# Patient Record
Sex: Female | Born: 1965
Health system: Southern US, Community
[De-identification: ages and names within clinical notes are randomized; demographics above are authoritative.]

## PROBLEM LIST (undated history)

## (undated) DIAGNOSIS — D649 Anemia, unspecified: Secondary | ICD-10-CM

## (undated) DIAGNOSIS — E119 Type 2 diabetes mellitus without complications: Secondary | ICD-10-CM

## (undated) DIAGNOSIS — G4733 Obstructive sleep apnea (adult) (pediatric): Secondary | ICD-10-CM

## (undated) DIAGNOSIS — G4700 Insomnia, unspecified: Secondary | ICD-10-CM

## (undated) DIAGNOSIS — G43909 Migraine, unspecified, not intractable, without status migrainosus: Secondary | ICD-10-CM

## (undated) DIAGNOSIS — M1990 Unspecified osteoarthritis, unspecified site: Secondary | ICD-10-CM

## (undated) DIAGNOSIS — F909 Attention-deficit hyperactivity disorder, unspecified type: Secondary | ICD-10-CM

## (undated) DIAGNOSIS — R079 Chest pain, unspecified: Secondary | ICD-10-CM

## (undated) DIAGNOSIS — J45909 Unspecified asthma, uncomplicated: Secondary | ICD-10-CM

## (undated) DIAGNOSIS — F32A Depression, unspecified: Secondary | ICD-10-CM

## (undated) DIAGNOSIS — R011 Cardiac murmur, unspecified: Secondary | ICD-10-CM

## (undated) DIAGNOSIS — G2581 Restless legs syndrome: Secondary | ICD-10-CM

## (undated) DIAGNOSIS — Z95810 Presence of automatic (implantable) cardiac defibrillator: Secondary | ICD-10-CM

## (undated) DIAGNOSIS — I499 Cardiac arrhythmia, unspecified: Secondary | ICD-10-CM

## (undated) DIAGNOSIS — I5020 Unspecified systolic (congestive) heart failure: Secondary | ICD-10-CM

## (undated) DIAGNOSIS — I2510 Atherosclerotic heart disease of native coronary artery without angina pectoris: Secondary | ICD-10-CM

## (undated) DIAGNOSIS — F329 Major depressive disorder, single episode, unspecified: Secondary | ICD-10-CM

## (undated) DIAGNOSIS — I428 Other cardiomyopathies: Secondary | ICD-10-CM

## (undated) DIAGNOSIS — K589 Irritable bowel syndrome without diarrhea: Secondary | ICD-10-CM

## (undated) DIAGNOSIS — T7840XA Allergy, unspecified, initial encounter: Secondary | ICD-10-CM

## (undated) DIAGNOSIS — K7460 Unspecified cirrhosis of liver: Secondary | ICD-10-CM

## (undated) DIAGNOSIS — G4730 Sleep apnea, unspecified: Secondary | ICD-10-CM

## (undated) DIAGNOSIS — R42 Dizziness and giddiness: Secondary | ICD-10-CM

## (undated) DIAGNOSIS — K5792 Diverticulitis of intestine, part unspecified, without perforation or abscess without bleeding: Secondary | ICD-10-CM

## (undated) DIAGNOSIS — F319 Bipolar disorder, unspecified: Secondary | ICD-10-CM

## (undated) DIAGNOSIS — I509 Heart failure, unspecified: Secondary | ICD-10-CM

## (undated) DIAGNOSIS — R109 Unspecified abdominal pain: Secondary | ICD-10-CM

## (undated) DIAGNOSIS — F4310 Post-traumatic stress disorder, unspecified: Secondary | ICD-10-CM

## (undated) DIAGNOSIS — E039 Hypothyroidism, unspecified: Secondary | ICD-10-CM

## (undated) DIAGNOSIS — I10 Essential (primary) hypertension: Secondary | ICD-10-CM

## (undated) HISTORY — DX: Chest pain, unspecified: R07.9

## (undated) HISTORY — DX: Unspecified osteoarthritis, unspecified site: M19.90

## (undated) HISTORY — PX: CORONARY ARTERY BYPASS GRAFT: SHX141

## (undated) HISTORY — DX: Unspecified abdominal pain: R10.9

## (undated) HISTORY — DX: Migraine, unspecified, not intractable, without status migrainosus: G43.909

## (undated) HISTORY — PX: INSERT / REPLACE / REMOVE PACEMAKER: SUR710

## (undated) HISTORY — DX: Insomnia, unspecified: G47.00

## (undated) HISTORY — DX: Restless legs syndrome: G25.81

## (undated) HISTORY — PX: INSERTION OF ICD: SHX6689

## (undated) HISTORY — DX: Irritable bowel syndrome, unspecified: K58.9

## (undated) HISTORY — DX: Diverticulitis of intestine, part unspecified, without perforation or abscess without bleeding: K57.92

## (undated) HISTORY — DX: Unspecified cirrhosis of liver: K74.60

## (undated) HISTORY — DX: Unspecified systolic (congestive) heart failure: I50.20

## (undated) HISTORY — PX: TUBAL LIGATION: SHX77

## (undated) HISTORY — DX: Attention-deficit hyperactivity disorder, unspecified type: F90.9

## (undated) HISTORY — DX: Dizziness and giddiness: R42

## (undated) HISTORY — DX: Other cardiomyopathies: I42.8

## (undated) HISTORY — DX: Obstructive sleep apnea (adult) (pediatric): G47.33

## (undated) HISTORY — DX: Post-traumatic stress disorder, unspecified: F43.10

## (undated) HISTORY — PX: ABDOMINAL HYSTERECTOMY: SHX81

## (undated) HISTORY — PX: CARDIAC CATHETERIZATION: SHX172

## (undated) HISTORY — PX: BREAST BIOPSY: SHX20

## (undated) HISTORY — PX: TONSILLECTOMY: SUR1361

## (undated) HISTORY — DX: Allergy, unspecified, initial encounter: T78.40XA

---
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
## 2014-08-13 HISTORY — PX: INSERTION OF ICD: SHX6689

## 2014-08-13 HISTORY — PX: OTHER SURGICAL HISTORY: SHX169

## 2014-08-16 DIAGNOSIS — F341 Dysthymic disorder: Secondary | ICD-10-CM | POA: Diagnosis not present

## 2014-08-16 DIAGNOSIS — E119 Type 2 diabetes mellitus without complications: Secondary | ICD-10-CM | POA: Diagnosis not present

## 2014-08-16 DIAGNOSIS — I5022 Chronic systolic (congestive) heart failure: Secondary | ICD-10-CM | POA: Diagnosis not present

## 2014-08-17 DIAGNOSIS — E119 Type 2 diabetes mellitus without complications: Secondary | ICD-10-CM | POA: Diagnosis not present

## 2014-08-17 DIAGNOSIS — H25013 Cortical age-related cataract, bilateral: Secondary | ICD-10-CM | POA: Diagnosis not present

## 2014-08-23 DIAGNOSIS — R5381 Other malaise: Secondary | ICD-10-CM | POA: Diagnosis not present

## 2014-08-23 DIAGNOSIS — M4690 Unspecified inflammatory spondylopathy, site unspecified: Secondary | ICD-10-CM | POA: Diagnosis not present

## 2014-08-23 DIAGNOSIS — M792 Neuralgia and neuritis, unspecified: Secondary | ICD-10-CM | POA: Diagnosis not present

## 2014-08-23 DIAGNOSIS — G894 Chronic pain syndrome: Secondary | ICD-10-CM | POA: Diagnosis not present

## 2014-09-01 DIAGNOSIS — Z803 Family history of malignant neoplasm of breast: Secondary | ICD-10-CM | POA: Diagnosis not present

## 2014-09-01 DIAGNOSIS — Z1231 Encounter for screening mammogram for malignant neoplasm of breast: Secondary | ICD-10-CM | POA: Diagnosis not present

## 2014-09-02 DIAGNOSIS — M5489 Other dorsalgia: Secondary | ICD-10-CM | POA: Diagnosis not present

## 2014-09-02 DIAGNOSIS — M6281 Muscle weakness (generalized): Secondary | ICD-10-CM | POA: Diagnosis not present

## 2014-09-02 DIAGNOSIS — M25659 Stiffness of unspecified hip, not elsewhere classified: Secondary | ICD-10-CM | POA: Diagnosis not present

## 2014-09-08 DIAGNOSIS — M6281 Muscle weakness (generalized): Secondary | ICD-10-CM | POA: Diagnosis not present

## 2014-09-08 DIAGNOSIS — M25652 Stiffness of left hip, not elsewhere classified: Secondary | ICD-10-CM | POA: Diagnosis not present

## 2014-09-08 DIAGNOSIS — M5489 Other dorsalgia: Secondary | ICD-10-CM | POA: Diagnosis not present

## 2016-01-31 DIAGNOSIS — R1013 Epigastric pain: Secondary | ICD-10-CM | POA: Diagnosis not present

## 2016-01-31 DIAGNOSIS — R188 Other ascites: Secondary | ICD-10-CM | POA: Diagnosis not present

## 2016-02-04 DIAGNOSIS — R1011 Right upper quadrant pain: Secondary | ICD-10-CM | POA: Diagnosis not present

## 2016-02-17 DIAGNOSIS — R109 Unspecified abdominal pain: Secondary | ICD-10-CM | POA: Diagnosis not present

## 2016-02-17 DIAGNOSIS — R079 Chest pain, unspecified: Secondary | ICD-10-CM | POA: Diagnosis not present

## 2016-04-22 ENCOUNTER — Encounter: Payer: Self-pay | Admitting: Emergency Medicine

## 2016-04-22 ENCOUNTER — Emergency Department
Admission: EM | Admit: 2016-04-22 | Discharge: 2016-04-22 | Disposition: A | Discharge status: Home / Self Care | Payer: Medicare (Managed Care) | Attending: Emergency Medicine | Admitting: Emergency Medicine

## 2016-04-22 ENCOUNTER — Emergency Department: Payer: Medicare (Managed Care)

## 2016-04-22 DIAGNOSIS — I10 Essential (primary) hypertension: Secondary | ICD-10-CM | POA: Diagnosis not present

## 2016-04-22 DIAGNOSIS — R1013 Epigastric pain: Secondary | ICD-10-CM | POA: Insufficient documentation

## 2016-04-22 DIAGNOSIS — R0602 Shortness of breath: Secondary | ICD-10-CM | POA: Diagnosis not present

## 2016-04-22 DIAGNOSIS — I2510 Atherosclerotic heart disease of native coronary artery without angina pectoris: Secondary | ICD-10-CM | POA: Diagnosis not present

## 2016-04-22 DIAGNOSIS — R0789 Other chest pain: Secondary | ICD-10-CM | POA: Diagnosis not present

## 2016-04-22 DIAGNOSIS — E119 Type 2 diabetes mellitus without complications: Secondary | ICD-10-CM | POA: Diagnosis not present

## 2016-04-22 DIAGNOSIS — G8929 Other chronic pain: Secondary | ICD-10-CM | POA: Insufficient documentation

## 2016-04-22 DIAGNOSIS — J45909 Unspecified asthma, uncomplicated: Secondary | ICD-10-CM | POA: Insufficient documentation

## 2016-04-22 DIAGNOSIS — R079 Chest pain, unspecified: Secondary | ICD-10-CM

## 2016-04-22 HISTORY — DX: Type 2 diabetes mellitus without complications: E11.9

## 2016-04-22 HISTORY — DX: Atherosclerotic heart disease of native coronary artery without angina pectoris: I25.10

## 2016-04-22 HISTORY — DX: Unspecified asthma, uncomplicated: J45.909

## 2016-04-22 HISTORY — DX: Major depressive disorder, single episode, unspecified: F32.9

## 2016-04-22 HISTORY — DX: Bipolar disorder, unspecified: F31.9

## 2016-04-22 HISTORY — DX: Depression, unspecified: F32.A

## 2016-04-22 HISTORY — DX: Essential (primary) hypertension: I10

## 2016-04-22 LAB — COMPREHENSIVE METABOLIC PANEL
ALBUMIN: 3.8 g/dL (ref 3.5–5.0)
ALT: 29 U/L (ref 14–54)
AST: 23 U/L (ref 15–41)
Alkaline Phosphatase: 64 U/L (ref 38–126)
Anion gap: 7 (ref 5–15)
BUN: 19 mg/dL (ref 6–20)
CHLORIDE: 109 mmol/L (ref 101–111)
CO2: 24 mmol/L (ref 22–32)
CREATININE: 0.93 mg/dL (ref 0.44–1.00)
Calcium: 9.5 mg/dL (ref 8.9–10.3)
GFR calc Af Amer: >60 mL/min (ref >60)
GLUCOSE: 87 mg/dL (ref 65–99)
POTASSIUM: 3.5 mmol/L (ref 3.5–5.1)
Sodium: 140 mmol/L (ref 135–145)
Total Bilirubin: 2.2 mg/dL — ABNORMAL HIGH (ref 0.3–1.2)
Total Protein: 7.3 g/dL (ref 6.5–8.1)

## 2016-04-22 LAB — CBC
HEMATOCRIT: 30.1 % — AB (ref 35.0–47.0)
Hemoglobin: 10.4 g/dL — ABNORMAL LOW (ref 12.0–16.0)
MCH: 32.6 pg (ref 26.0–34.0)
MCHC: 34.5 g/dL (ref 32.0–36.0)
MCV: 94.4 fL (ref 80.0–100.0)
PLATELETS: 174 10*3/uL (ref 150–440)
RBC: 3.19 MIL/uL — AB (ref 3.80–5.20)
RDW: 15 % — AB (ref 11.5–14.5)
WBC: 8.9 10*3/uL (ref 3.6–11.0)

## 2016-04-22 LAB — TROPONIN I
TROPONIN I: 0.03 ng/mL — AB (ref <0.03)
Troponin I: 0.03 ng/mL (ref <0.03)

## 2016-04-22 LAB — LIPASE, BLOOD: Lipase: 32 U/L (ref 11–51)

## 2016-04-22 MED ORDER — HYDROCODONE-ACETAMINOPHEN 5-325 MG PO TABS: 1.0000 | ORAL_TABLET | ORAL | 0 refills | Status: DC | PRN

## 2016-04-22 MED ORDER — MORPHINE SULFATE (PF) 4 MG/ML IV SOLN
4.0000 mg | Freq: Once | INTRAVENOUS | Status: AC
  Administered 2016-04-22: 4 mg via INTRAVENOUS
  Filled 2016-04-22: qty 1

## 2016-04-22 MED ORDER — ONDANSETRON HCL 4 MG/2ML IJ SOLN
4.0000 mg | Freq: Once | INTRAMUSCULAR | Status: AC
  Administered 2016-04-22: 4 mg via INTRAVENOUS
  Filled 2016-04-22: qty 2

## 2016-04-22 NOTE — ED Notes (Signed)
NAD Noted at this time. PT resting in bed with eyes closed, respirations even and unlabored at this time. Will continue to monitor for further patient needs.

## 2016-04-22 NOTE — ED Notes (Signed)
NAD noted at this time. Pt resting in bed with family at bedside. Danelle Earthly, RN at bedside to redraw 2nd troponin. Delay explained to patient at this time. Pt states understanding. Pt placed on 2L O2 at this time due to decreasing sats to 88%, on 2L pt O2 sats 98%. Pt states when she sleeps she is supposed to wear CPAP.

## 2016-04-22 NOTE — ED Triage Notes (Signed)
Pt with cheat pain that started yesterday and woke her up this am. Pain is left chest 8/10. EMS gave 324mg  aspirin and 2 sprays nitro. Pain improved to 5/10. Pt with pacemaker.

## 2016-04-22 NOTE — ED Notes (Signed)
NAD noted at time of D/C. Pt taken to the lobby by Mardene Celeste, EDT via wheelchair. Pt states understanding of D/C instructions. MD aware of pt's BP, pt instructed to take a dose of BP medication when she gets home. Pt denies any questions or concerns at this time.

## 2016-04-22 NOTE — ED Provider Notes (Signed)
[facility] Emergency Department Provider Note  Time seen: 4:15 PM  I have reviewed the triage vital signs and the nursing notes.   HISTORY  Chief Complaint No chief complaint on file.    HPI Bonnie Ochoa is a 50 y.o. female with a past medical history of hypertension, hyperlipidemia, CAD status post AICD, who presents the emergency department for shortness of breath and chest pain. According to the patient since last night she has been experiencing intermittent shortness of breath, states around 2:00 this morning she began experiencing chest discomfort which she describes as a pressure sensation with sharp pain in the upper abdomen. States the abdominal pain is long-standing times multiple years, unchanged. She states the chest pain and shortness of breath, and go, she has been told in the past that she had a small heart attack denies any stents being placed. She states she has the AICD because the left part of her heart is enlarged. Patient received 2 sprays of nitroglycerin and 324 mg of aspirin prior to arrival with mild relief of chest discomfort. Patient also states significant anxiety which presents with chest discomfort and shortness of breath, and she is not sure if this is just her anxiety flaring up. Patient is from [place], states she recently moved to the area. Describes her chest discomfort currently as moderate, with shortness of breath is mild. Denies any leg pain or swelling.  No past medical history on file.  There are no active problems to display for this patient.   No past surgical history on file.  Prior to Admission medications   Not on File    Allergies not on file  No family history on file.  Social History Social History  Substance Use Topics  . Smoking status: Not on file  . Smokeless tobacco: Not on file  . Alcohol use Not on file    Review of Systems Constitutional: Negative for fever Cardiovascular: Intermittent chest  pain Respiratory: Intermittent shortness of breath Gastrointestinal: Upper abdominal pain, chronic, unchanged per patient. Negative for vomiting or diarrhea. Genitourinary: Negative for dysuria. Musculoskeletal: Negative for back pain. Neurological: Negative for headache 10-point ROS otherwise negative.  ____________________________________________   PHYSICAL EXAM:  Constitutional: Alert and oriented. Well appearing and in no distress. Eyes: Normal exam ENT   Head: Normocephalic and atraumatic.   Mouth/Throat: Mucous membranes are moist. Cardiovascular: Normal rate, regular rhythm. No murmur Respiratory: Normal respiratory effort without tachypnea nor retractions. Breath sounds are clear  Gastrointestinal: Soft and nontender. No distention Musculoskeletal: Nontender with normal range of motion in all extremities. No lower extremity tenderness or edema. Neurologic:  Normal speech and language. No gross focal neurologic deficits  Skin:  Skin is warm, dry and intact.  Psychiatric: Mood and affect are normal.  ____________________________________________    EKG  EKG reviewed and interpreted by myself shows normal sinus rhythm at 75 bpm, narrow QRS, normal axis, largely normal intervals, nonspecific ST changes with inferolateral T-wave inversions, no old EKG for comparison.  ____________________________________________    RADIOLOGY  Chest x-ray negative  ____________________________________________   INITIAL IMPRESSION / ASSESSMENT AND PLAN / ED COURSE  Pertinent labs & imaging results that were available during my care of the patient were reviewed by me and considered in my medical decision making (see chart for details).  The patient presents the emergency department for shortness of breath and intermittent chest pain which began overnight. Patient states mild shortness of breath with moderate chest discomfort currently. States she get the same  symptoms with her  anxiety and she is not sure if this is her anxiety or for something wrong with her heart. We will treat the patient's pain, obtain labs including troponin and lipase, chest x-ray, EKG, and closely monitor in the emergency department.  Chest x-ray is negative. EKG does show abnormalities with no old EKG for comparison. Patient's troponin is negative 2. Patient states she feels well her only complaint is mild epigastric pain but she states this is chronic. I discussed the patient the pain increases or she has any return of chest pain she is to return to the emergency department, the patient is agreeable to this plan.   ____________________________________________   FINAL CLINICAL IMPRESSION(S) / ED DIAGNOSES  Dyspnea Chest pain    Minna Antis, MD 04/22/16 2014

## 2016-04-22 NOTE — Discharge Instructions (Signed)
You have been seen in the emergency department today for chest pain. Your workup has shown normal results. As we discussed please follow-up with your primary care physician in the next 1-2 days for recheck. Return to the emergency department for any further chest pain, trouble breathing, or any other symptom personally concerning to yourself. °

## 2016-04-30 ENCOUNTER — Emergency Department: Payer: Medicare (Managed Care)

## 2016-04-30 ENCOUNTER — Encounter: Payer: Self-pay | Admitting: *Deleted

## 2016-04-30 ENCOUNTER — Emergency Department
Admission: EM | Admit: 2016-04-30 | Discharge: 2016-04-30 | Disposition: A | Discharge status: Home / Self Care | Payer: Medicare (Managed Care) | Attending: Emergency Medicine | Admitting: Emergency Medicine

## 2016-04-30 DIAGNOSIS — R1084 Generalized abdominal pain: Secondary | ICD-10-CM | POA: Insufficient documentation

## 2016-04-30 DIAGNOSIS — E119 Type 2 diabetes mellitus without complications: Secondary | ICD-10-CM | POA: Insufficient documentation

## 2016-04-30 DIAGNOSIS — I10 Essential (primary) hypertension: Secondary | ICD-10-CM | POA: Insufficient documentation

## 2016-04-30 DIAGNOSIS — M549 Dorsalgia, unspecified: Secondary | ICD-10-CM | POA: Insufficient documentation

## 2016-04-30 DIAGNOSIS — J45909 Unspecified asthma, uncomplicated: Secondary | ICD-10-CM | POA: Diagnosis not present

## 2016-04-30 DIAGNOSIS — R0789 Other chest pain: Secondary | ICD-10-CM | POA: Diagnosis present

## 2016-04-30 DIAGNOSIS — F419 Anxiety disorder, unspecified: Secondary | ICD-10-CM | POA: Insufficient documentation

## 2016-04-30 DIAGNOSIS — F1290 Cannabis use, unspecified, uncomplicated: Secondary | ICD-10-CM | POA: Insufficient documentation

## 2016-04-30 DIAGNOSIS — I2510 Atherosclerotic heart disease of native coronary artery without angina pectoris: Secondary | ICD-10-CM | POA: Insufficient documentation

## 2016-04-30 LAB — COMPREHENSIVE METABOLIC PANEL
ALBUMIN: 3.8 g/dL (ref 3.5–5.0)
ALK PHOS: 96 U/L (ref 38–126)
ALT: 212 U/L — ABNORMAL HIGH (ref 14–54)
ANION GAP: 8 (ref 5–15)
AST: 74 U/L — ABNORMAL HIGH (ref 15–41)
BUN: 21 mg/dL — ABNORMAL HIGH (ref 6–20)
CALCIUM: 9.2 mg/dL (ref 8.9–10.3)
CO2: 24 mmol/L (ref 22–32)
Chloride: 107 mmol/L (ref 101–111)
Creatinine, Ser: 1 mg/dL (ref 0.44–1.00)
GFR calc non Af Amer: >60 mL/min (ref >60)
Glucose, Bld: 164 mg/dL — ABNORMAL HIGH (ref 65–99)
POTASSIUM: 3.3 mmol/L — AB (ref 3.5–5.1)
SODIUM: 139 mmol/L (ref 135–145)
Total Bilirubin: 2.3 mg/dL — ABNORMAL HIGH (ref 0.3–1.2)
Total Protein: 7.4 g/dL (ref 6.5–8.1)

## 2016-04-30 LAB — CBC
HEMATOCRIT: 29.6 % — AB (ref 35.0–47.0)
HEMOGLOBIN: 10.1 g/dL — AB (ref 12.0–16.0)
MCH: 32.7 pg (ref 26.0–34.0)
MCHC: 34.1 g/dL (ref 32.0–36.0)
MCV: 95.9 fL (ref 80.0–100.0)
Platelets: 188 10*3/uL (ref 150–440)
RBC: 3.09 MIL/uL — AB (ref 3.80–5.20)
RDW: 17 % — ABNORMAL HIGH (ref 11.5–14.5)
WBC: 7.4 10*3/uL (ref 3.6–11.0)

## 2016-04-30 LAB — LIPASE, BLOOD: LIPASE: 26 U/L (ref 11–51)

## 2016-04-30 LAB — TROPONIN I: TROPONIN I: 0.03 ng/mL — AB (ref <0.03)

## 2016-04-30 MED ORDER — LORAZEPAM 2 MG/ML IJ SOLN
1.0000 mg | Freq: Once | INTRAMUSCULAR | Status: AC
  Administered 2016-04-30: 1 mg via INTRAVENOUS
  Filled 2016-04-30: qty 1

## 2016-04-30 MED ORDER — SODIUM CHLORIDE 0.9 % IV SOLN
Freq: Once | INTRAVENOUS | Status: AC
  Administered 2016-04-30: 16:00:00 via INTRAVENOUS

## 2016-04-30 MED ORDER — ONDANSETRON HCL 4 MG PO TABS: 4.0000 mg | ORAL_TABLET | Freq: Every day | ORAL | 1 refills | Status: DC | PRN

## 2016-04-30 MED ORDER — PANTOPRAZOLE SODIUM 40 MG PO TBEC: 40.0000 mg | DELAYED_RELEASE_TABLET | Freq: Every day | ORAL | 1 refills | Status: DC

## 2016-04-30 MED ORDER — HYDROMORPHONE HCL 1 MG/ML IJ SOLN
1.0000 mg | Freq: Once | INTRAMUSCULAR | Status: AC
  Administered 2016-04-30: 1 mg via INTRAVENOUS
  Filled 2016-04-30: qty 1

## 2016-04-30 MED ORDER — LORAZEPAM 0.5 MG PO TABS: 0.5000 mg | ORAL_TABLET | Freq: Three times a day (TID) | ORAL | 0 refills | Status: DC | PRN

## 2016-04-30 MED ORDER — OXYCODONE-ACETAMINOPHEN 5-325 MG PO TABS: 2.0000 | ORAL_TABLET | Freq: Four times a day (QID) | ORAL | 0 refills | Status: DC | PRN

## 2016-04-30 NOTE — ED Notes (Signed)
Dr. Don Perking notified of troponin 0.03.

## 2016-04-30 NOTE — ED Provider Notes (Signed)
[facility] Emergency Department Provider Note        Time seen: ----------------------------------------- 3:08 PM on 04/30/2016 -----------------------------------------    I have reviewed the triage vital signs and the nursing notes.   HISTORY  Chief Complaint Abdominal Pain and Chest Pain    HPI Bonnie Ochoa is a 50 y.o. female who presents to ER for generalized abdominal pain and chest pain. Patient states she has anxiety attacks as well as a myriad of other symptoms. Patient states she has had blood in her stools, urinary incontinence, hemoptysis, nosebleeds, PTSD and bipolar symptoms. Patient states nothing makes her symptoms better or worse. She is here for the same symptoms she was seen for approximately 8 days ago.   Past Medical History:  Diagnosis Date  . Asthma   . Bipolar 1 disorder ([place])   . Coronary artery disease   . Depression   . Diabetes mellitus without complication ([place])   . Hypertension     There are no active problems to display for this patient.   Past Surgical History:  Procedure Laterality Date  . ABDOMINAL HYSTERECTOMY    . INSERTION OF ICD      Allergies Review of patient's allergies indicates no known allergies.  Social History Social History  Substance Use Topics  . Smoking status: Never Smoker  . Smokeless tobacco: Never Used  . Alcohol use Yes    Review of Systems Constitutional: Negative for fever. Cardiovascular: Positive for chest pain Respiratory: Positive shortness of breath Gastrointestinal: Positive for abdominal pain, rectal bleeding Genitourinary: Negative for dysuria. Musculoskeletal: Positive for back pain Skin: Negative for rash. Neurological: Negative for headaches, focal weakness or numbness. Psychiatric: Positive for anxiety and panic attacks  10-point ROS otherwise negative.  ____________________________________________   PHYSICAL EXAM:  VITAL SIGNS: ED Triage Vitals  Enc  Vitals Group     BP 04/30/16 1220 (!) 143/93     Pulse Rate 04/30/16 1220 62     Resp 04/30/16 1220 18     Temp 04/30/16 1220 98.3 F (36.8 C)     Temp Source 04/30/16 1220 Oral     SpO2 04/30/16 1220 96 %     Weight 04/30/16 1219 155 lb (70.3 kg)     Height 04/30/16 1219 5' 5"  (1.651 m)     Head Circumference --      Peak Flow --      Pain Score 04/30/16 1219 8     Pain Loc --      Pain Edu? --      Excl. in [place]? --     Constitutional: Alert and oriented. No acute distress Eyes: Conjunctivae are normal. PERRL. Normal extraocular movements. ENT   Head: Normocephalic and atraumatic.   Nose: No congestion/rhinnorhea.   Mouth/Throat: Mucous membranes are moist.   Neck: No stridor. Cardiovascular: Normal rate, regular rhythm. No murmurs, rubs, or gallops. Respiratory: Normal respiratory effort without tachypnea nor retractions. Breath sounds are clear and equal bilaterally. No wheezes/rales/rhonchi. Gastrointestinal: Soft with diffuse upper abdominal tenderness, no rebound or guarding. Normal bowel sounds. Musculoskeletal: Nontender with normal range of motion in all extremities. No lower extremity tenderness nor edema. Neurologic:  Normal speech and language. No gross focal neurologic deficits are appreciated.  Skin:  Skin is warm, dry and intact. No rash noted. Psychiatric: Depressed mood and affect ____________________________________________  EKG: Interpreted by me. Sinus rhythm rate of 62 bpm, normal PR interval, normal QRS, normal QT interval. Normal axis. Inferior lateral T wave inversions unchanged  from prior.  ____________________________________________  ED COURSE:  Pertinent labs & imaging results that were available during my care of the patient were reviewed by me and considered in my medical decision making (see chart for details). Clinical Course  Patient presents to ER with multiple symptoms. We'll assess with basic labs and imaging. She will receive  IV Dilaudid and Ativan.  Procedures ____________________________________________   LABS (pertinent positives/negatives)  Labs Reviewed  COMPREHENSIVE METABOLIC PANEL - Abnormal; Notable for the following:       Result Value   Potassium 3.3 (*)    Glucose, Bld 164 (*)    BUN 21 (*)    AST 74 (*)    ALT 212 (*)    Total Bilirubin 2.3 (*)    All other components within normal limits  CBC - Abnormal; Notable for the following:    RBC 3.09 (*)    Hemoglobin 10.1 (*)    HCT 29.6 (*)    RDW 17.0 (*)    All other components within normal limits  TROPONIN I - Abnormal; Notable for the following:    Troponin I 0.03 (*)    All other components within normal limits  LIPASE, BLOOD  URINALYSIS COMPLETEWITH MICROSCOPIC ([facility] ONLY)    RADIOLOGY  Abdominal ultrasound IMPRESSION: Gallbladder wall thickening which may be related to mild ascites. There is a positive sonographic Murphy sign which may suggest acalculous cholecystitis. HIDA scan may be helpful if indicated.  ____________________________________________  FINAL ASSESSMENT AND PLAN  Chest pain, abdominal pain, anxiety attacks  Plan: Patient with labs and imaging as dictated above. Patient's lab and ultrasound findings are likely more reflective of cirrhosis due to a past medical history of heavy alcohol intake. She will be referred to general surgery for outpatient follow-up. I will place her on a short supply of pain medicine, anxiolytics and antacids until she can establish care with a primary doctor and be referred to general surgery.   Earleen Newport, MD   Note: This dictation was prepared with Dragon dictation. Any transcriptional errors that result from this process are unintentional    Earleen Newport, MD 04/30/16 [phone]

## 2016-04-30 NOTE — ED Notes (Signed)
Pt also states they wanted to remove her gallbladder 2 months ago but did not get cardiac clearance

## 2016-04-30 NOTE — ED Triage Notes (Signed)
States generalized abd pain and pain, states anxiety attacks, states blood in her stool, states symptoms for 2 weeks, states hx of bipolar and PTSD, states urinary incontience and coughing up blood, states nose bleed

## 2016-05-07 ENCOUNTER — Encounter: Payer: Self-pay | Admitting: General Surgery

## 2016-05-07 ENCOUNTER — Telehealth: Payer: Self-pay | Admitting: General Surgery

## 2016-05-07 ENCOUNTER — Ambulatory Visit (INDEPENDENT_AMBULATORY_CARE_PROVIDER_SITE_OTHER): Payer: Self-pay | Admitting: General Surgery

## 2016-05-07 VITALS — BP 138/104 | HR 58 | Temp 97.7°F | Ht 65.0 in | Wt 176.0 lb

## 2016-05-07 DIAGNOSIS — R1084 Generalized abdominal pain: Secondary | ICD-10-CM

## 2016-05-07 MED ORDER — HYDROCODONE-ACETAMINOPHEN 5-325 MG PO TABS: 1.0000 | ORAL_TABLET | Freq: Four times a day (QID) | ORAL | Status: DC | PRN

## 2016-05-07 MED ORDER — HYDROCODONE-ACETAMINOPHEN 5-325 MG PO TABS: 1.0000 | ORAL_TABLET | Freq: Four times a day (QID) | ORAL | 0 refills | Status: DC | PRN

## 2016-05-07 NOTE — Patient Instructions (Signed)
We would like for you to have your CT scan done on 05/14/16 @ 10.00. We need for you to go by the Radiology department today to pick up your prep for your CT scan. Please do NOT have any food or drink 4 hours prior to your CT scan. We will call you with the results of your CT scan. Please call our office if you have any questions.   Please see the address below for your CT scan. [address]

## 2016-05-07 NOTE — Telephone Encounter (Signed)
Explained to patient that we are not a provider of her [employer] [place] medic[place] medicaid plan and will be a self pay and she understood this and agreed to go ahead and see the doctor.

## 2016-05-07 NOTE — Progress Notes (Signed)
Patient ID: Bonnie Ochoa, female   DOB: 1965-12-06, 50 y.o.   MRN: [id number]  CC: ABDOMINAL PAIN  HPI Bonnie Ochoa is a 50 y.o. female who presents to clinic today for evaluation of abdominal pain. Patient was seen in the emergency Department last week for this pain and was then sent to clinic today. She reports the pain actually started about 4 and half weeks ago. She is unable to state anything that makes the pain better or worse. She states she's also developed shortness of breath during that time period. Patient states that she is also had fevers, chills, nausea during that timeframe. She denies any chest pain, diarrhea, constipation. Patient is very frustrated by how long she has felt ill and is unsure was actually going on.  HPI  Past Medical History:  Diagnosis Date  . Asthma   . Bipolar 1 disorder ([facility])   . Coronary artery disease   . Depression   . Diabetes mellitus without complication ([facility])   . Hypertension     Past Surgical History:  Procedure Laterality Date  . ABDOMINAL HYSTERECTOMY    . debribalator  2016  . hysterectmy  2011  . INSERTION OF ICD    . TONSILECTOMY, ADENOIDECTOMY, BILATERAL MYRINGOTOMY AND TUBES    . TUBAL LIGATION  1980    History reviewed. No pertinent family history.  Social History Social History  Substance Use Topics  . Smoking status: Never Smoker  . Smokeless tobacco: Never Used  . Alcohol use Yes    No Known Allergies  Current Outpatient Prescriptions  Medication Sig Dispense Refill  . aspirin EC 81 MG tablet Take 81 mg by mouth daily.    Marland Kitchen atorvastatin (LIPITOR) 40 MG tablet Take 40 mg by mouth daily.    . furosemide (LASIX) 80 MG tablet Take 80 mg by mouth.    Marland Kitchen lisinopril (PRINIVIL,ZESTRIL) 20 MG tablet Take 20 mg by mouth daily.    . metoprolol (TOPROL-XL) 200 MG 24 hr tablet Take 200 mg by mouth daily.    Marland Kitchen omega-3 acid ethyl esters (LOVAZA) 1 g capsule Take 1 capsule by mouth 2 (two) times daily.    Marland Kitchen PARoxetine (PAXIL) 30  MG tablet Take 30 mg by mouth daily.    . potassium chloride (KLOR-CON) 20 MEQ packet Take 20 mEq by mouth 2 (two) times daily.    . prazosin (MINIPRESS) 1 MG capsule Take 1 mg by mouth at bedtime.    Marland Kitchen QUEtiapine (SEROQUEL) 300 MG tablet Take 300 mg by mouth at bedtime.    Marland Kitchen spironolactone (ALDACTONE) 25 MG tablet Take 25 mg by mouth daily.    Marland Kitchen HYDROcodone-acetaminophen (NORCO) 5-325 MG tablet Take 1 tablet by mouth every 6 (six) hours as needed for moderate pain. 30 tablet 0  . LORazepam (ATIVAN) 0.5 MG tablet Take 1 tablet (0.5 mg total) by mouth every 8 (eight) hours as needed for anxiety. 30 tablet 0  . ondansetron (ZOFRAN) 4 MG tablet Take 1 tablet (4 mg total) by mouth daily as needed for nausea or vomiting. 20 tablet 1  . oxyCODONE-acetaminophen (PERCOCET) 5-325 MG tablet Take 2 tablets by mouth every 6 (six) hours as needed for moderate pain or severe pain. 20 tablet 0  . pantoprazole (PROTONIX) 40 MG tablet Take 1 tablet (40 mg total) by mouth daily. 30 tablet 1   No current facility-administered medications for this visit.      Review of Systems A Multi-point review of systems was asked and was negative  except for the findings documented in the history of present illness  Physical Exam Blood pressure (!) 138/104, pulse (!) 58, temperature 97.7 F (36.5 C), temperature source Oral, height 5\' 5"  (1.651 m), weight 79.8 kg (176 lb). CONSTITUTIONAL: No acute distress. EYES: Pupils are equal, round, and reactive to light, Sclera are non-icteric. EARS, NOSE, MOUTH AND THROAT: The oropharynx is clear. The oral mucosa is pink and moist. Hearing is intact to voice. LYMPH NODES:  Lymph nodes in the neck are normal. RESPIRATORY:  Lungs are clear. There is normal respiratory effort, with equal breath sounds bilaterally, and without pathologic use of accessory muscles. CARDIOVASCULAR: Heart is regular without murmurs, gallops, or rubs. GI: The abdomen is large, soft, tender to palpation to  mild palpation of all 4 quadrants but without rebound or guarding. The abdomen appears to be mildly distended with a palpable fluid wave. There are no palpable masses. There is palpable evidence of hepatosplenomegaly on physical exam. There are normal bowel sounds in all quadrants. GU: Rectal deferred.   MUSCULOSKELETAL: Normal muscle strength and tone. No cyanosis or edema.   SKIN: Turgor is good and there are no pathologic skin lesions or ulcers. NEUROLOGIC: Motor and sensation is grossly normal. Cranial nerves are grossly intact. PSYCH:  Oriented to person, place and time. Affect is normal.  Data Reviewed Upon review of the workup from the ER patient had elevation of her liver enzymes with an elevated ale to 212, and elevated AST of 74, elevated bilirubin of 2.4. Her white blood cell count was normal 7.4. Her chest x-ray was normal but her ultrasound was interpreted as thickened gallbladder wall in the setting of ascites. I have personally reviewed the patient's imaging, laboratory findings and medical records.    Assessment    Abdominal pain    Plan    50 year old female with abdominal pain of all 4 quadrants and what appears to be abdominal exam findings consistent with ascites and hepatosplenomegaly. Given these findings and her exam combination with the chronicity discussed with the patient the multiple possible causes of hepatosplenomegaly. Chronic alcohol abuse is a possibility. Discussed with the ultrasound did not visualize the entirety of the abdomen and given that her pain is in all 4 quadrants we will obtain an outpatient CT of the abdomen and pelvis. Discussed the patient that should she become unable to tolerate oral hydration or her level of pain that she should return to the emergency department for further evaluation and treatment. Otherwise we will obtain a CT scan and call her with the results. Should she have the suspected ascites and evidence of liver failure she'll be  referred to a liver specialist. Should her imaging return normal we will then bring her back to clinic to discuss whether not to further workup for gallbladder or remove her gallbladder at that time.     Time spent with the patient was 45 minutes, with more than 50% of the time spent in face-to-face education, counseling and care coordination.     Ricarda Frameharles Socorro Kanitz, MD FACS General Surgeon 05/07/2016, 4:13 PM

## 2016-05-14 ENCOUNTER — Ambulatory Visit
Admission: RE | Admit: 2016-05-14 | Discharge: 2016-05-14 | Disposition: A | Discharge status: Home / Self Care | Payer: Medicare Other | Source: Ambulatory Visit | Attending: General Surgery | Admitting: General Surgery

## 2016-05-14 DIAGNOSIS — R600 Localized edema: Secondary | ICD-10-CM | POA: Diagnosis not present

## 2016-05-14 DIAGNOSIS — R1084 Generalized abdominal pain: Secondary | ICD-10-CM | POA: Diagnosis not present

## 2016-05-14 DIAGNOSIS — I700 Atherosclerosis of aorta: Secondary | ICD-10-CM | POA: Diagnosis not present

## 2016-05-14 DIAGNOSIS — R188 Other ascites: Secondary | ICD-10-CM | POA: Insufficient documentation

## 2016-05-14 DIAGNOSIS — M5136 Other intervertebral disc degeneration, lumbar region: Secondary | ICD-10-CM | POA: Diagnosis not present

## 2016-05-14 HISTORY — DX: Heart failure, unspecified: I50.9

## 2016-05-14 MED ORDER — IOPAMIDOL (ISOVUE-300) INJECTION 61%
100.0000 mL | Freq: Once | INTRAVENOUS | Status: AC | PRN
  Administered 2016-05-14: 100 mL via INTRAVENOUS

## 2016-05-18 ENCOUNTER — Encounter: Payer: Self-pay | Admitting: General Surgery

## 2016-05-19 ENCOUNTER — Emergency Department
Admission: EM | Admit: 2016-05-19 | Discharge: 2016-05-20 | Disposition: A | Discharge status: Home / Self Care | Payer: Medicare Other | Attending: Emergency Medicine | Admitting: Emergency Medicine

## 2016-05-19 ENCOUNTER — Encounter: Payer: Self-pay | Admitting: General Surgery

## 2016-05-19 ENCOUNTER — Emergency Department: Payer: Medicare Other

## 2016-05-19 ENCOUNTER — Encounter: Payer: Self-pay | Admitting: Emergency Medicine

## 2016-05-19 DIAGNOSIS — E119 Type 2 diabetes mellitus without complications: Secondary | ICD-10-CM | POA: Insufficient documentation

## 2016-05-19 DIAGNOSIS — R1084 Generalized abdominal pain: Secondary | ICD-10-CM | POA: Insufficient documentation

## 2016-05-19 DIAGNOSIS — Z7982 Long term (current) use of aspirin: Secondary | ICD-10-CM | POA: Insufficient documentation

## 2016-05-19 DIAGNOSIS — J45909 Unspecified asthma, uncomplicated: Secondary | ICD-10-CM | POA: Insufficient documentation

## 2016-05-19 DIAGNOSIS — I2510 Atherosclerotic heart disease of native coronary artery without angina pectoris: Secondary | ICD-10-CM | POA: Insufficient documentation

## 2016-05-19 DIAGNOSIS — I509 Heart failure, unspecified: Secondary | ICD-10-CM | POA: Insufficient documentation

## 2016-05-19 DIAGNOSIS — Z87891 Personal history of nicotine dependence: Secondary | ICD-10-CM | POA: Insufficient documentation

## 2016-05-19 DIAGNOSIS — R079 Chest pain, unspecified: Secondary | ICD-10-CM | POA: Diagnosis not present

## 2016-05-19 DIAGNOSIS — F329 Major depressive disorder, single episode, unspecified: Secondary | ICD-10-CM | POA: Diagnosis not present

## 2016-05-19 DIAGNOSIS — R609 Edema, unspecified: Secondary | ICD-10-CM | POA: Diagnosis not present

## 2016-05-19 DIAGNOSIS — R0789 Other chest pain: Secondary | ICD-10-CM | POA: Diagnosis not present

## 2016-05-19 DIAGNOSIS — Z79899 Other long term (current) drug therapy: Secondary | ICD-10-CM | POA: Diagnosis not present

## 2016-05-19 DIAGNOSIS — I110 Hypertensive heart disease with heart failure: Secondary | ICD-10-CM | POA: Diagnosis not present

## 2016-05-19 LAB — CBC
HEMATOCRIT: 37.1 % (ref 35.0–47.0)
HEMOGLOBIN: 12.5 g/dL (ref 12.0–16.0)
MCH: 31.5 pg (ref 26.0–34.0)
MCHC: 33.6 g/dL (ref 32.0–36.0)
MCV: 93.7 fL (ref 80.0–100.0)
Platelets: 308 10*3/uL (ref 150–440)
RBC: 3.96 MIL/uL (ref 3.80–5.20)
RDW: 17.6 % — AB (ref 11.5–14.5)
WBC: 10.5 10*3/uL (ref 3.6–11.0)

## 2016-05-19 LAB — COMPREHENSIVE METABOLIC PANEL
ALT: 102 U/L — ABNORMAL HIGH (ref 14–54)
ANION GAP: 11 (ref 5–15)
AST: 63 U/L — ABNORMAL HIGH (ref 15–41)
Albumin: 3.8 g/dL (ref 3.5–5.0)
Alkaline Phosphatase: 89 U/L (ref 38–126)
BILIRUBIN TOTAL: 3.7 mg/dL — AB (ref 0.3–1.2)
BUN: 23 mg/dL — ABNORMAL HIGH (ref 6–20)
CO2: 20 mmol/L — ABNORMAL LOW (ref 22–32)
Calcium: 9.1 mg/dL (ref 8.9–10.3)
Chloride: 106 mmol/L (ref 101–111)
Creatinine, Ser: 1.15 mg/dL — ABNORMAL HIGH (ref 0.44–1.00)
GFR calc Af Amer: >60 mL/min (ref >60)
GFR, EST NON AFRICAN AMERICAN: 54 mL/min — AB (ref >60)
Glucose, Bld: 122 mg/dL — ABNORMAL HIGH (ref 65–99)
POTASSIUM: 3.7 mmol/L (ref 3.5–5.1)
Sodium: 137 mmol/L (ref 135–145)
TOTAL PROTEIN: 7.8 g/dL (ref 6.5–8.1)

## 2016-05-19 LAB — LIPASE, BLOOD: Lipase: 48 U/L (ref 11–51)

## 2016-05-19 LAB — TROPONIN I: TROPONIN I: 0.04 ng/mL — AB (ref <0.03)

## 2016-05-19 LAB — BRAIN NATRIURETIC PEPTIDE: B Natriuretic Peptide: 3787 pg/mL — ABNORMAL HIGH (ref 0.0–100.0)

## 2016-05-19 NOTE — ED Provider Notes (Signed)
[facility] Emergency Department Provider Note   ____________________________________________   First MD Initiated Contact with Patient 05/19/16 2300     (approximate)  I have reviewed the triage vital signs and the nursing notes.   HISTORY  Chief Complaint Abdominal Pain; Chest Pain; and Generalized Body Aches   HPI Bonnie Ochoa is a 50 y.o. female with a history of bipolar disorder as well as CHF and anasarca who is presenting to the emergency department with chronic chest and abdominal pain. She says that she has years of this ongoing pain but ever since moving from [place] one half months ago. She has had several visits to the hospital. She says the pain is 10 out of 10 and constant across her chest and also diffusely to the abdomen. She describes nausea vomiting and diarrhea. Does not report any blood in her stool or vomit today. Denies any alcohol use or drug use. Says that the pain is cramping. Says that she has had paracentesis in the past. Says that she is compliant with her outpatient medications. Has also been seen by surgery recently referred her to gastroenterology. She says that she has been taking oxycodone at home but is no longer relieving the pain. She also describes feeling very anxious and having altered full "panic attacks" where she then passes out.   Past Medical History:  Diagnosis Date  . Asthma   . Bipolar 1 disorder (HCC)   . CHF (congestive heart failure) (HCC)   . Coronary artery disease   . Depression   . Diabetes mellitus without complication (HCC)   . Hypertension     Patient Active Problem List   Diagnosis Date Noted  . Generalized abdominal pain 05/07/2016    Past Surgical History:  Procedure Laterality Date  . ABDOMINAL HYSTERECTOMY    . debribalator  2016  . hysterectmy  2011  . INSERTION OF ICD    . TONSILECTOMY, ADENOIDECTOMY, BILATERAL MYRINGOTOMY AND TUBES    . TUBAL LIGATION  1980    Prior to Admission  medications   Medication Sig Start Date End Date Taking? Authorizing Provider  aspirin EC 81 MG tablet Take 81 mg by mouth daily.   Yes Historical Provider, MD  atorvastatin (LIPITOR) 40 MG tablet Take 40 mg by mouth daily.   Yes Historical Provider, MD  furosemide (LASIX) 80 MG tablet Take 80 mg by mouth.   Yes Historical Provider, MD  lisinopril (PRINIVIL,ZESTRIL) 20 MG tablet Take 20 mg by mouth daily.   Yes Historical Provider, MD  LORazepam (ATIVAN) 0.5 MG tablet Take 1 tablet (0.5 mg total) by mouth every 8 (eight) hours as needed for anxiety. 04/30/16 04/30/17 Yes Emily Filbert, MD  metoprolol (TOPROL-XL) 200 MG 24 hr tablet Take 200 mg by mouth daily.   Yes Historical Provider, MD  omega-3 acid ethyl esters (LOVAZA) 1 g capsule Take 1 capsule by mouth 2 (two) times daily.   Yes Historical Provider, MD  ondansetron (ZOFRAN) 4 MG tablet Take 1 tablet (4 mg total) by mouth daily as needed for nausea or vomiting. 04/30/16  Yes Emily Filbert, MD  oxyCODONE-acetaminophen (PERCOCET) 5-325 MG tablet Take 2 tablets by mouth every 6 (six) hours as needed for moderate pain or severe pain. 04/30/16  Yes Emily Filbert, MD  pantoprazole (PROTONIX) 40 MG tablet Take 1 tablet (40 mg total) by mouth daily. 04/30/16 04/30/17 Yes Emily Filbert, MD  PARoxetine (PAXIL) 30 MG tablet Take 30 mg by mouth daily.  Yes Historical Provider, MD  potassium chloride (KLOR-CON) 20 MEQ packet Take 20 mEq by mouth 2 (two) times daily.   Yes Historical Provider, MD  prazosin (MINIPRESS) 1 MG capsule Take 1 mg by mouth at bedtime.   Yes Historical Provider, MD  QUEtiapine (SEROQUEL) 300 MG tablet Take 300 mg by mouth at bedtime.   Yes Historical Provider, MD  spironolactone (ALDACTONE) 25 MG tablet Take 25 mg by mouth daily.   Yes Historical Provider, MD  HYDROcodone-acetaminophen (NORCO) 5-325 MG tablet Take 1 tablet by mouth every 6 (six) hours as needed for moderate pain. Patient not taking: Reported on  05/20/2016 05/07/16   Ricarda Frame, MD    Allergies Review of patient's allergies indicates no known allergies.  History reviewed. No pertinent family history.  Social History Social History  Substance Use Topics  . Smoking status: Former Games developer  . Smokeless tobacco: Never Used  . Alcohol use No    Review of Systems Constitutional: No fever/chills Eyes: No visual changes. ENT: No sore throat. Cardiovascular: As above Respiratory: Reports shortness of breath as well with exertion. Gastrointestinal:  No constipation. Genitourinary: Negative for dysuria. Musculoskeletal: Negative for back pain. Skin: Negative for rash. Neurological: Negative for headaches, focal weakness or numbness.  10-point ROS otherwise negative.  ____________________________________________   PHYSICAL EXAM:  VITAL SIGNS: ED Triage Vitals  Enc Vitals Group     BP 05/19/16 2035 (!) 162/118     Pulse Rate 05/19/16 2035 60     Resp 05/19/16 2035 18     Temp 05/19/16 2035 97.8 F (36.6 C)     Temp Source 05/19/16 2035 Oral     SpO2 05/19/16 2035 97 %     Weight 05/19/16 2036 170 lb (77.1 kg)     Height 05/19/16 2036 5\' 5"  (1.651 m)     Head Circumference --      Peak Flow --      Pain Score 05/19/16 2044 10     Pain Loc --      Pain Edu? --      Excl. in GC? --     Constitutional: Alert and oriented.  no acute distress. Eyes: Conjunctivae are normal. PERRL. EOMI. Head: Atraumatic. Nose: No congestion/rhinnorhea. Mouth/Throat: Mucous membranes are moist.   Neck: No stridor.   Cardiovascular: Normal rate, regular rhythm. Grossly normal heart sounds.   Respiratory: Normal respiratory effort.  No retractions. Lungs CTAB. Gastrointestinal: Soft With mild and diffuse tenderness to palpation. Abdomen is distended but not tense. Musculoskeletal: Mild bilateral lower extremity edema.  No joint effusions. Neurologic:  Normal speech and language. No gross focal neurologic deficits are appreciated.    Skin:  Skin is warm, dry and intact. No rash noted. Psychiatric: Mood and affect are normal. Speech and behavior are normal.  ____________________________________________   LABS (all labs ordered are listed, but only abnormal results are displayed)  Labs Reviewed  COMPREHENSIVE METABOLIC PANEL - Abnormal; Notable for the following:       Result Value   CO2 20 (*)    Glucose, Bld 122 (*)    BUN 23 (*)    Creatinine, Ser 1.15 (*)    AST 63 (*)    ALT 102 (*)    Total Bilirubin 3.7 (*)    GFR calc non Af Amer 54 (*)    All other components within normal limits  CBC - Abnormal; Notable for the following:    RDW 17.6 (*)    All other components within  normal limits  BRAIN NATRIURETIC PEPTIDE - Abnormal; Notable for the following:    B Natriuretic Peptide 3,787.0 (*)    All other components within normal limits  TROPONIN I - Abnormal; Notable for the following:    Troponin I 0.04 (*)    All other components within normal limits  LIPASE, BLOOD  URINALYSIS COMPLETEWITH MICROSCOPIC ([facility] ONLY)   ____________________________________________  EKG  ED ECG REPORT I, Arelia LongestSchaevitz,  Lyan Moyano M, the attending physician, personally viewed and interpreted this ECG.   Date: 05/19/2016  EKG Time: 2054  Rate: 62  Rhythm: normal sinus rhythm  Axis: Normal axis  Intervals:none  ST&T Change: No ST segment elevation or depression. T-wave inversions in lead 3 and aVF. No significant change from previous EKGs on record.  ____________________________________________  RADIOLOGY  DG Chest 2 View (Accession [id number]) (Order [id number])  Imaging  Date: 05/19/2016 Department: [facility] EMERGENCY DEPARTMENT Released By/Authorizing: Myrna Blazeravid Matthew Keonta Alsip, MD (auto-released)  Exam Information   Status Exam Begun  Exam Ended   Final [99] 05/19/2016 11:30 PM 05/19/2016 11:35 PM  PACS Images   Show images for DG Chest 2 View  Study Result   CLINICAL DATA:  Generalized  body aches and chest pain.  EXAM: CHEST  2 VIEW  COMPARISON:  April 30, 2016  FINDINGS: Stable cardiomegaly and left AICD device. Mild atelectasis in the left mid lung. No other interval changes or acute abnormalities.  IMPRESSION: No active cardiopulmonary disease.   Electronically Signed   By: Gerome Samavid  Williams III M.D   On: 05/19/2016 23:47    ____________________________________________   PROCEDURES  Procedure(s) performed:   Procedures  Critical Care performed:   ____________________________________________   INITIAL IMPRESSION / ASSESSMENT AND PLAN / ED COURSE  Pertinent labs & imaging results that were available during my care of the patient were reviewed by me and considered in my medical decision making (see chart for details).  ----------------------------------------- 4:56 AM on 05/20/2016 -----------------------------------------  Patient is resting comfortably at this time. No shortness of breath. No distress. Elevated BNP but this may be her baseline in the setting of chronic heart failure. Her chest x-ray was clear. Her lungs were clear to auscultation. I discussed the importance of following up with a cardiologist. The patient says that due to [place] she has not been able to establish care with cardiology. He says he is also waiting for a call from the gastroenterologist after being referred by the surgeon. Likely ongoing chronic conditions causing the patient's visit today. I'm not seeing any acute cause for admission or further workup at this time. I explained the plan for discharge to home. The patient as well as her family member and they are willing to comply and understand the plan.  Clinical Course     ____________________________________________   FINAL CLINICAL IMPRESSION(S) / ED DIAGNOSES  Chest pain. Abdominal pain. Peripheral edema.    NEW MEDICATIONS STARTED DURING THIS VISIT:  New  Prescriptions   No medications on file     Note:  This document was prepared using Dragon voice recognition software and may include unintentional dictation errors.    Myrna Blazeravid Matthew Idil Maslanka, MD 05/20/16 [phone]

## 2016-05-19 NOTE — ED Notes (Addendum)
Additional family member at bedside.

## 2016-05-19 NOTE — ED Notes (Signed)
Dr. Pershing Proud in to see and assess patient.

## 2016-05-19 NOTE — ED Notes (Signed)
Pt states she moved here from [place] 1 1/2 yrs ago. Used to be on all the meds listed on her med rec but has not taken for "awhile". Still has not gotten a pcp, but did follow up with surgeon as was directed when last here. Hx of ascites, chronic abd pain and is suppose to be on lasix. Currently out of her pain meds. When asked where her pain is she makes a circle with her hand from her breasts down to pubis stating "all over".

## 2016-05-19 NOTE — ED Notes (Signed)
Patient returned to ED 24 from radiology. MD aware that patient back in the room.

## 2016-05-19 NOTE — ED Notes (Signed)
Patient to radiology at this time for MD ordered 2 view CXR.

## 2016-05-19 NOTE — ED Triage Notes (Signed)
Pt c/o generalized body aches with generalized abd pain for over a month; also having pain to left upper chest for over a month; seen  Here several weeks ago for same; pt to have follow up appt, has called her followup MD as directed but no appointment has been scheduled by that office to date; pt was prescribed pain medication which she is now out of; pt c/o shortness of breath, feeling lightheaded, nausea with vomiting; taking Alleve at home for pain but getting no relief; sister says pt has been having intermittent fevers as well

## 2016-05-21 ENCOUNTER — Telehealth: Payer: Self-pay

## 2016-05-21 NOTE — Telephone Encounter (Signed)
Tried reaching patient at the numbers listed in chart, however the numbers are incorrect and have been deleted. Call placed to pharmacy at this time to obtain a current phone number and was provided the same numbers we had on file. Appointment reminder sent to patients address that is on file letting her know she has an appointment to follow up with Dr.Wohl on 05/29/16 @ 3 pm to go over CT results.

## 2016-05-23 DIAGNOSIS — F329 Major depressive disorder, single episode, unspecified: Secondary | ICD-10-CM | POA: Insufficient documentation

## 2016-05-23 DIAGNOSIS — I129 Hypertensive chronic kidney disease with stage 1 through stage 4 chronic kidney disease, or unspecified chronic kidney disease: Secondary | ICD-10-CM | POA: Insufficient documentation

## 2016-05-23 DIAGNOSIS — J45909 Unspecified asthma, uncomplicated: Secondary | ICD-10-CM | POA: Insufficient documentation

## 2016-05-23 DIAGNOSIS — E1129 Type 2 diabetes mellitus with other diabetic kidney complication: Secondary | ICD-10-CM | POA: Insufficient documentation

## 2016-05-23 DIAGNOSIS — F319 Bipolar disorder, unspecified: Secondary | ICD-10-CM | POA: Insufficient documentation

## 2016-05-23 DIAGNOSIS — F32A Depression, unspecified: Secondary | ICD-10-CM | POA: Insufficient documentation

## 2016-05-24 ENCOUNTER — Encounter: Payer: Self-pay | Admitting: Gastroenterology

## 2016-05-24 ENCOUNTER — Ambulatory Visit (INDEPENDENT_AMBULATORY_CARE_PROVIDER_SITE_OTHER): Payer: Medicare (Managed Care) | Admitting: Gastroenterology

## 2016-05-24 VITALS — BP 160/98 | HR 62 | Ht 65.0 in | Wt 185.0 lb

## 2016-05-24 DIAGNOSIS — R188 Other ascites: Secondary | ICD-10-CM | POA: Diagnosis not present

## 2016-05-24 MED ORDER — FUROSEMIDE 20 MG PO TABS: 20.0000 mg | ORAL_TABLET | Freq: Every day | ORAL | 0 refills | Status: DC

## 2016-05-24 MED ORDER — SPIRONOLACTONE 25 MG PO TABS: 25.0000 mg | ORAL_TABLET | Freq: Every day | ORAL | 0 refills | Status: DC

## 2016-05-24 MED ORDER — PANTOPRAZOLE SODIUM 40 MG PO TBEC: 40.0000 mg | DELAYED_RELEASE_TABLET | Freq: Every day | ORAL | 5 refills | Status: DC

## 2016-05-24 NOTE — Patient Instructions (Addendum)
You have been given 3 prescriptions today. Please go to your pharmacy to pick these up today. If you have any questions, please contact our office.

## 2016-05-24 NOTE — Progress Notes (Signed)
Gastroenterology Consultation  Referring Provider:     No ref. provider found Primary Care Physician:  No PCP Per Patient Primary Gastroenterologist:  Dr. Servando Snare     Reason for Consultation:     Abdominal pain        HPI:   Bonnie Ochoa is a 50 y.o. y/o female referred for consultation & management of Abdominal pain by Dr. Bonnetta Barry PCP Per Patient.  This patient comes in today after being referred to me by Dr. Tonita Cong. The patient was referred to me for possible cirrhosis and abdominal pain with swelling. The patient had a CT scan consistent with right heart failure and ascites. The patient also has steatosis. The patient denies any alcohol abuse or history of alcohol abuse. The patient's platelets have also been normal as well as her albumin. The patient has moved from [place] within the last 3 months and does not have a primary care provider but does have a history of heart failure. The patient reports that she was told in [place] that she cannot have her gallbladder out because of her extensive cardiac issues. The patient comes in today with her creatinine elevated and she has run out of most of her medications.  Past Medical History:  Diagnosis Date  . Asthma   . Bipolar 1 disorder (HCC)   . CHF (congestive heart failure) (HCC)   . Coronary artery disease   . Depression   . Diabetes mellitus without complication (HCC)   . Hypertension     Past Surgical History:  Procedure Laterality Date  . ABDOMINAL HYSTERECTOMY    . debribalator  2016  . hysterectmy  2011  . INSERTION OF ICD    . TONSILECTOMY, ADENOIDECTOMY, BILATERAL MYRINGOTOMY AND TUBES    . TUBAL LIGATION  1980    Prior to Admission medications   Medication Sig Start Date End Date Taking? Authorizing Provider  LORazepam (ATIVAN) 0.5 MG tablet Take 1 tablet (0.5 mg total) by mouth every 8 (eight) hours as needed for anxiety. 04/30/16 04/30/17 Yes Emily Filbert, MD  metoprolol (TOPROL-XL) 200 MG 24 hr tablet Take 200 mg by  mouth daily.   Yes Historical Provider, MD  oxyCODONE-acetaminophen (PERCOCET) 5-325 MG tablet Take 2 tablets by mouth every 6 (six) hours as needed for moderate pain or severe pain. 04/30/16  Yes Emily Filbert, MD  aspirin EC 81 MG tablet Take 81 mg by mouth daily.    Historical Provider, MD  atorvastatin (LIPITOR) 40 MG tablet Take 40 mg by mouth daily.    Historical Provider, MD  HYDROcodone-acetaminophen (NORCO) 5-325 MG tablet Take 1 tablet by mouth every 6 (six) hours as needed for moderate pain. Patient not taking: Reported on 05/24/2016 05/07/16   Ricarda Frame, MD  lisinopril (PRINIVIL,ZESTRIL) 20 MG tablet Take 20 mg by mouth daily.    Historical Provider, MD  omega-3 acid ethyl esters (LOVAZA) 1 g capsule Take 1 capsule by mouth 2 (two) times daily.    Historical Provider, MD  ondansetron (ZOFRAN) 4 MG tablet Take 1 tablet (4 mg total) by mouth daily as needed for nausea or vomiting. Patient not taking: Reported on 05/24/2016 04/30/16   Emily Filbert, MD  pantoprazole (PROTONIX) 40 MG tablet Take 1 tablet (40 mg total) by mouth daily. Patient not taking: Reported on 05/24/2016 04/30/16 04/30/17  Emily Filbert, MD  PARoxetine (PAXIL) 30 MG tablet Take 30 mg by mouth daily.    Historical Provider, MD  potassium chloride (KLOR-CON) 20  MEQ packet Take 20 mEq by mouth 2 (two) times daily.    Historical Provider, MD  prazosin (MINIPRESS) 1 MG capsule Take 1 mg by mouth at bedtime.    Historical Provider, MD  QUEtiapine (SEROQUEL) 300 MG tablet Take 300 mg by mouth at bedtime.    Historical Provider, MD  spironolactone (ALDACTONE) 25 MG tablet Take 25 mg by mouth daily.    Historical Provider, MD    Family History  Problem Relation Age of Onset  . Hypertension Mother   . Hyperlipidemia Mother      Social History  Substance Use Topics  . Smoking status: Former Games developer  . Smokeless tobacco: Never Used  . Alcohol use No    Allergies as of 05/24/2016  . (No Known  Allergies)    Review of Systems:    All systems reviewed and negative except where noted in HPI.   Physical Exam:  BP (!) 160/98   Pulse 62   Ht 5\' 5"  (1.651 m)   Wt 185 lb (83.9 kg)   BMI 30.79 kg/m  No LMP recorded. Patient has had a hysterectomy. Psych:  Alert and cooperative. Normal mood and affect. General:   Alert,  Well-developed, well-nourished, pleasant and cooperative in NAD Head:  Normocephalic and atraumatic. Eyes:  Sclera clear, no icterus.   Conjunctiva pink. Ears:  Normal auditory acuity. Nose:  No deformity, discharge, or lesions. Mouth:  No deformity or lesions,oropharynx pink & moist. Neck:  Supple; no masses or thyromegaly. Lungs:  Respirations even and unlabored.  Clear throughout to auscultation.   No wheezes, crackles, or rhonchi. No acute distress. Heart:  Regular rate and rhythm; no murmurs, clicks, rubs, or gallops. Abdomen:  Normal bowel sounds.  No bruits.  Soft, non-tender and Distended without masses noted.. There is ascites present. Negative Carnett sign.   Rectal:  Deferred.  Msk:  Symmetrical without gross deformities.  Good, equal movement & strength bilaterally. Pulses:  Normal pulses noted. Extremities:  No clubbing or edema.  No cyanosis. Neurologic:  Alert and oriented x3;  grossly normal neurologically. Skin:  Intact without significant lesions or rashes.  No jaundice. Lymph Nodes:  No significant cervical adenopathy. Psych:  Alert and cooperative. Normal mood and affect.  Imaging Studies: Dg Chest 2 View  Result Date: 05/19/2016 CLINICAL DATA:  Generalized body aches and chest pain. EXAM: CHEST  2 VIEW COMPARISON:  April 30, 2016 FINDINGS: Stable cardiomegaly and left AICD device. Mild atelectasis in the left mid lung. No other interval changes or acute abnormalities. IMPRESSION: No active cardiopulmonary disease. Electronically Signed   By: Gerome Sam III M.D   On: 05/19/2016 23:47   Dg Chest 2 View  Result Date:  04/30/2016 CLINICAL DATA:  Chest pain EXAM: CHEST  2 VIEW COMPARISON:  04/22/2016 chest radiograph. FINDINGS: Stable configuration of single lead left subclavian ICD. Stable cardiomediastinal silhouette with mild cardiomegaly. No pneumothorax. No pleural effusion. No overt pulmonary edema. No acute consolidative airspace disease. Moderate thoracic spondylosis. IMPRESSION: 1. Stable mild cardiomegaly without overt pulmonary edema. 2. No acute pulmonary disease. Electronically Signed   By: Delbert Phenix M.D.   On: 04/30/2016 12:59   Ct Abdomen Pelvis W Contrast  Result Date: 05/14/2016 CLINICAL DATA:  Nausea,  vomiting and diarrhea for 1 month. EXAM: CT ABDOMEN AND PELVIS WITH CONTRAST TECHNIQUE: Multidetector CT imaging of the abdomen and pelvis was performed using the standard protocol following bolus administration of intravenous contrast. CONTRAST:  ISOVUE-300 IOPAMIDOL (ISOVUE-300) INJECTION 61% COMPARISON:  None. FINDINGS: Lower chest: The lung bases are clear. Hepatobiliary: Hepatic steatosis noted. There is reflux of contrast material from the right atrium into the hepatic veins compatible with passive venous congestion. No focal liver abnormalities. Diffuse gallbladder wall edema is identified. No biliary dilatation noted. Pancreas: Unremarkable. No pancreatic ductal dilatation or surrounding inflammatory changes. Spleen: Normal in size without focal abnormality. Adrenals/Urinary Tract: Adrenal glands are unremarkable. Kidneys are normal, without renal calculi, focal lesion, or hydronephrosis. Bladder is unremarkable. Stomach/Bowel: Stomach is within normal limits. Appendix appears normal. No evidence of bowel wall thickening, distention, or inflammatory changes. Vascular/Lymphatic: Calcified atherosclerotic disease involves the abdominal aorta. No aneurysm. No enlarged retroperitoneal or mesenteric adenopathy. No enlarged pelvic or inguinal lymph nodes. Reproductive: Status post hysterectomy. No  adnexal masses. Other: There is moderate ascites within the abdomen and pelvis. Diffuse body wall edema is identified. Musculoskeletal: Degenerative disc disease is identified at the L4-5 level. IMPRESSION: 1. Moderate abdominal ascites and body wall edema. 2. Reflux of contrast material into the IVC and hepatic veins compatible with passive venous congestion which may be seen with right heart failure. 3. Gallbladder wall edema is nonspecific in the setting of ascites and anasarca. 4. Aortic atherosclerosis 5. Lumbar degenerative disc disease. Electronically Signed   By: Signa Kellaylor  Stroud M.D.   On: 05/14/2016 10:59   [place] Abdomen Limited Ruq  Result Date: 04/30/2016 CLINICAL DATA:  Right upper quadrant pain for several weeks EXAM: US ABDOMEN LIMITED - RIGHT UPPER QUADRANT COMPARISON:  None. FINDINGS: Gallbladder: Gallbladder is well distended. Wall thickening to 3.6 mm is noted with a small amount of pericholecystic fluid. No gallstones are noted. Positive sonographic Eulah PontMurphy sign is noted. Common bile duct: Diameter: 4.7 mm. Liver: No focal lesion identified. Within normal limits in parenchymal echogenicity. Some mild ascites is noted surrounding the spleen and liver. IMPRESSION: Gallbladder wall thickening which may be related to mild ascites. There is a positive sonographic Murphy sign which may suggest acalculous cholecystitis. HIDA scan may be helpful if indicated. Electronically Signed   By: Alcide CleverMark  Lukens M.D.   On: 04/30/2016 16:05    Assessment and Plan:   Janee MornCheryl Ochoa is a 50 y.o. y/o female who comes in today with ascites and lab findings consistent with cardiac ascites and anasarca. The patient will be given a low dose of her Aldactone and Lasix. She will also be restarted on her pantoprazole. The patient has been told that I will not be able to fill all of her other medications and she will need to see a primary care provider and a cardiologist. I have spoken to Dr. Kirke CorinArida who has agreed to see the  patient as soon as possible. I have explained the plan to the patient and she agrees.   Note: This dictation was prepared with Dragon dictation along with smaller phrase technology. Any transcriptional errors that result from this process are unintentional.

## 2016-05-25 ENCOUNTER — Other Ambulatory Visit: Payer: Self-pay | Admitting: Gastroenterology

## 2016-05-25 DIAGNOSIS — I517 Cardiomegaly: Secondary | ICD-10-CM

## 2016-05-28 ENCOUNTER — Other Ambulatory Visit: Payer: Self-pay

## 2016-05-28 ENCOUNTER — Encounter: Payer: Self-pay | Admitting: Cardiovascular Disease

## 2016-05-28 ENCOUNTER — Ambulatory Visit (INDEPENDENT_AMBULATORY_CARE_PROVIDER_SITE_OTHER): Payer: Medicare (Managed Care) | Admitting: Cardiovascular Disease

## 2016-05-28 ENCOUNTER — Ambulatory Visit (INDEPENDENT_AMBULATORY_CARE_PROVIDER_SITE_OTHER): Payer: Medicare (Managed Care)

## 2016-05-28 VITALS — BP 148/98 | HR 70 | Ht 65.0 in | Wt 171.5 lb

## 2016-05-28 DIAGNOSIS — R0602 Shortness of breath: Secondary | ICD-10-CM

## 2016-05-28 DIAGNOSIS — I509 Heart failure, unspecified: Secondary | ICD-10-CM

## 2016-05-28 DIAGNOSIS — R188 Other ascites: Secondary | ICD-10-CM

## 2016-05-28 DIAGNOSIS — Z95810 Presence of automatic (implantable) cardiac defibrillator: Secondary | ICD-10-CM

## 2016-05-28 DIAGNOSIS — E6609 Other obesity due to excess calories: Secondary | ICD-10-CM

## 2016-05-28 DIAGNOSIS — I420 Dilated cardiomyopathy: Secondary | ICD-10-CM

## 2016-05-28 DIAGNOSIS — I517 Cardiomegaly: Secondary | ICD-10-CM | POA: Diagnosis not present

## 2016-05-28 DIAGNOSIS — I10 Essential (primary) hypertension: Secondary | ICD-10-CM | POA: Diagnosis not present

## 2016-05-28 MED ORDER — METOPROLOL SUCCINATE ER 50 MG PO TB24: 50.0000 mg | ORAL_TABLET | Freq: Every day | ORAL | 6 refills | Status: DC

## 2016-05-28 MED ORDER — POTASSIUM CHLORIDE CRYS ER 20 MEQ PO TBCR: 20.0000 meq | EXTENDED_RELEASE_TABLET | Freq: Every day | ORAL | 3 refills | Status: DC

## 2016-05-28 MED ORDER — FUROSEMIDE 40 MG PO TABS: 40.0000 mg | ORAL_TABLET | Freq: Every day | ORAL | 3 refills | Status: DC

## 2016-05-28 MED ORDER — METOPROLOL SUCCINATE ER 50 MG PO TB24: 25.0000 mg | ORAL_TABLET | Freq: Every day | ORAL | 6 refills | Status: DC

## 2016-05-28 NOTE — Patient Instructions (Addendum)
Medication Instructions:   Please restart lisinopril 20 mg one a day Restart metoprolol 50 start 1/2 pill one a day Restart aspirin 81 mg daily Restart atorvastatin 40 mg  Restart potassium 20 meq one a day   Labwork:  No new labs needed  Testing/Procedures:  No further testing at this time   Follow-Up: It was a pleasure seeing you in the office today. Please call us if you have new issues that need to be addressed before your next appt.  [phone]  Your physician wants you to follow-up in: 1 month.   You have been referred to Dr. Graciela Husbands for evaluation.   You will receive a reminder letter in the mail two months in advance. If you don't receive a letter, please call our office to schedule the follow-up appointment.  If you need a refill on your cardiac medications before your next appointment, please call your pharmacy.    It was a pleasure seeing you today here in the office. Please do not hesitate to give [place] a call back if you have any further questions. [phone]  Morse Cellar RN, BSN

## 2016-05-28 NOTE — Progress Notes (Addendum)
Cardiology Office Note  Date:  05/28/2016   ID:  Bonnie Ochoa, DOB [birth year], MRN [id number]  PCP:  No PCP Per Patient   Chief Complaint  Patient presents with  . other    New pt appt.  Dx w/ CHF in [place].  S/p ICD placement 08/2014.    HPI:  Bonnie Ochoa is a pleasant 50 year old woman arrived from [place] several months ago with notes in the computer detailing History of bipolar, coronary artery disease, diabetes, hypertension, asthma History of heart failure, history of ICD, anemia Nonischemic cardiomyopathy (patient denies any coronary disease on prior cardiac catheterization) Details of the above are unclear She presents by referral from Dr. Servando Snare, GI. She was seen for ascites Notes indicate a History of paracentesis Previously on pain medication at home, oxycodone History of anxiety Records of the above been requested from [place]  Review of the computer notes indicate she was Seen in the emergency room 05/19/2016 for abdominal pain, chest pain BNP in the hospital 3700 Several visits to the hospital since arrival from [place]  She is out of her medications except for Aldactone,  Protonix, Lasix provided by GI  On the Lasix started recently her abdominal swelling has improved, she denies any significant lower extremity edema Reports her shortness of breath has persisted, still has difficulty walking She reports being 50% of her baseline compared to what she was before she left [place] Improving orthopnea, PND on Lasix  CT scan consistent with moderate abdominal ascites, fatty liver Aortic atherosclerosis No prior alcohol abuse  ekg with NSR rate 70 bpm, nonspecific T wave ABN V5, V6, inferior leads  PMH:   has a past medical history of Asthma; Bipolar 1 disorder (HCC); CHF (congestive heart failure) (HCC); Coronary artery disease; Depression; Diabetes mellitus without complication (HCC); and Hypertension.  PSH:    Past Surgical History:  Procedure Laterality Date  . ABDOMINAL  HYSTERECTOMY    . debribalator  2016  . hysterectmy  2011  . INSERT / REPLACE / REMOVE PACEMAKER    . INSERTION OF ICD    . TONSILECTOMY, ADENOIDECTOMY, BILATERAL MYRINGOTOMY AND TUBES    . TUBAL LIGATION  1980    Current Outpatient Prescriptions  Medication Sig Dispense Refill  . aspirin EC 81 MG tablet Take 81 mg by mouth daily.    Marland Kitchen atorvastatin (LIPITOR) 40 MG tablet Take 40 mg by mouth daily.    . furosemide (LASIX) 20 MG tablet Take 1 tablet (20 mg total) by mouth daily. 30 tablet 0  . HYDROcodone-acetaminophen (NORCO) 5-325 MG tablet Take 1 tablet by mouth every 6 (six) hours as needed for moderate pain. 30 tablet 0  . lisinopril (PRINIVIL,ZESTRIL) 20 MG tablet Take 20 mg by mouth daily.    Marland Kitchen LORazepam (ATIVAN) 0.5 MG tablet Take 1 tablet (0.5 mg total) by mouth every 8 (eight) hours as needed for anxiety. 30 tablet 0  . omega-3 acid ethyl esters (LOVAZA) 1 g capsule Take 1 capsule by mouth 2 (two) times daily.    Marland Kitchen oxyCODONE-acetaminophen (PERCOCET) 5-325 MG tablet Take 2 tablets by mouth every 6 (six) hours as needed for moderate pain or severe pain. 20 tablet 0  . PARoxetine (PAXIL) 30 MG tablet Take 30 mg by mouth daily.    . potassium chloride (KLOR-CON) 20 MEQ packet Take 20 mEq by mouth 2 (two) times daily.    . prazosin (MINIPRESS) 1 MG capsule Take 1 mg by mouth at bedtime.    Marland Kitchen QUEtiapine (SEROQUEL) 300 MG  tablet Take 300 mg by mouth at bedtime.    Marland Kitchen. spironolactone (ALDACTONE) 25 MG tablet Take 1 tablet (25 mg total) by mouth daily. 30 tablet 0  . metoprolol succinate (TOPROL-XL) 50 MG 24 hr tablet Take 1 tablet (50 mg total) by mouth daily. Take 1/2 pill once daily 30 tablet 6  . ondansetron (ZOFRAN) 4 MG tablet Take 1 tablet (4 mg total) by mouth daily as needed for nausea or vomiting. (Patient not taking: Reported on 05/28/2016) 20 tablet 1  . pantoprazole (PROTONIX) 40 MG tablet Take 1 tablet (40 mg total) by mouth daily. (Patient not taking: Reported on 05/28/2016) 30  tablet 5   No current facility-administered medications for this visit.      Allergies:   Review of patient's allergies indicates no known allergies.   Social History:  The patient  reports that she has quit smoking. She has never used smokeless tobacco. She reports that she uses drugs, including Marijuana. She reports that she does not drink alcohol.   Family History:   family history includes Hyperlipidemia in her mother; Hypertension in her mother.    Review of Systems: Review of Systems  Constitutional: Positive for malaise/fatigue.  Respiratory: Positive for shortness of breath.   Cardiovascular: Negative.        Abdominal swelling  Gastrointestinal: Negative.   Musculoskeletal: Negative.   Neurological: Negative.   Psychiatric/Behavioral: Negative.   All other systems reviewed and are negative.    PHYSICAL EXAM: VS:  BP (!) 148/98 (BP Location: Left Arm, Patient Position: Sitting, Cuff Size: Normal)   Pulse 70   Ht 5\' 5"  (1.651 m)   Wt 171 lb 8 oz (77.8 kg)   BMI 28.54 kg/m  , BMI Body mass index is 28.54 kg/m. GEN: Well nourished, well developed, in no acute distress, obese  HEENT: normal  Neck: no JVD, carotid bruits, or masses Cardiac: RRR; no murmurs, rubs, or gallops,no edema  Respiratory:  clear to auscultation bilaterally, normal work of breathing GI: soft, nontender, nondistended, + BS MS: no deformity or atrophy  Skin: warm and dry, no rash Neuro:  Strength and sensation are intact Psych: euthymic mood, full affect    Recent Labs: 05/19/2016: ALT 102; B Natriuretic Peptide 3,787.0; BUN 23; Creatinine, Ser 1.15; Hemoglobin 12.5; Platelets 308; Potassium 3.7; Sodium 137    Lipid Panel No results found for: CHOL, HDL, LDLCALC, TRIG    Wt Readings from Last 3 Encounters:  05/28/16 171 lb 8 oz (77.8 kg)  05/24/16 185 lb (83.9 kg)  05/19/16 170 lb (77.1 kg)       ASSESSMENT AND PLAN:  Congestive heart failure, unspecified congestive heart  failure chronicity, unspecified congestive heart failure type (HCC) - Plan: EKG 12-Lead, Ambulatory referral to Cardiac Electrophysiology --She has ICD in place. Records have been requested Recommended she start metoprolol succinate 25 mg daily with slow titration up to 50 mg daily (previously was on 200 mg daily in [place]). Suggested she restart her lisinopril,  Stay on her Lasix, would start potassium daily. Stay on Aldactone We'll restart low-dose aspirin, Lipitor Hypertension, unspecified type  Congestive dilated cardiomyopathy (HCC) Records have been requested Appears to have nonischemic cardiomyopathy per patient and CT scan showing no coronary calcifications, no PAD  Other ascites Abdomen does not feel distended, tight.  She reports symptoms improved on Lasix 20 mg daily  Shortness of breath Shortness of breath likely secondary to cardiomyopathy, off her medications, hypertension, chronic systolic CHF We'll restart her medications as above  Obesity due to excess calories without serious comorbidity, unspecified classification  ICD in place We will arrange follow-up visit with Dr. Graciela Husbands to get established   Total encounter time more than 45 minutes  Greater than 50% was spent in counseling and coordination of care with the patient  Disposition:   F/U  1 month   Orders Placed This Encounter  Procedures  . Ambulatory referral to Cardiac Electrophysiology  . EKG 12-Lead     Signed, Dossie Arbour, M.D., Ph.D. 05/28/2016  [facility] [place], [place] [phone]

## 2016-05-29 ENCOUNTER — Other Ambulatory Visit: Payer: Self-pay

## 2016-05-29 ENCOUNTER — Ambulatory Visit: Payer: Medicare (Managed Care) | Admitting: Gastroenterology

## 2016-05-29 ENCOUNTER — Encounter: Payer: Self-pay | Admitting: Emergency Medicine

## 2016-05-29 ENCOUNTER — Emergency Department: Payer: Medicare Other

## 2016-05-29 DIAGNOSIS — T447X6A Underdosing of beta-adrenoreceptor antagonists, initial encounter: Secondary | ICD-10-CM | POA: Diagnosis present

## 2016-05-29 DIAGNOSIS — Z9114 Patient's other noncompliance with medication regimen: Secondary | ICD-10-CM

## 2016-05-29 DIAGNOSIS — R0602 Shortness of breath: Secondary | ICD-10-CM | POA: Diagnosis not present

## 2016-05-29 DIAGNOSIS — I110 Hypertensive heart disease with heart failure: Secondary | ICD-10-CM | POA: Diagnosis not present

## 2016-05-29 DIAGNOSIS — I429 Cardiomyopathy, unspecified: Secondary | ICD-10-CM | POA: Diagnosis present

## 2016-05-29 DIAGNOSIS — R64 Cachexia: Secondary | ICD-10-CM | POA: Diagnosis present

## 2016-05-29 DIAGNOSIS — I2510 Atherosclerotic heart disease of native coronary artery without angina pectoris: Secondary | ICD-10-CM | POA: Diagnosis present

## 2016-05-29 DIAGNOSIS — F319 Bipolar disorder, unspecified: Secondary | ICD-10-CM | POA: Diagnosis present

## 2016-05-29 DIAGNOSIS — Z7982 Long term (current) use of aspirin: Secondary | ICD-10-CM

## 2016-05-29 DIAGNOSIS — T500X6A Underdosing of mineralocorticoids and their antagonists, initial encounter: Secondary | ICD-10-CM | POA: Diagnosis present

## 2016-05-29 DIAGNOSIS — Z8249 Family history of ischemic heart disease and other diseases of the circulatory system: Secondary | ICD-10-CM

## 2016-05-29 DIAGNOSIS — Z23 Encounter for immunization: Secondary | ICD-10-CM

## 2016-05-29 DIAGNOSIS — K7460 Unspecified cirrhosis of liver: Secondary | ICD-10-CM | POA: Diagnosis present

## 2016-05-29 DIAGNOSIS — R0902 Hypoxemia: Secondary | ICD-10-CM | POA: Diagnosis present

## 2016-05-29 DIAGNOSIS — R0789 Other chest pain: Secondary | ICD-10-CM | POA: Diagnosis not present

## 2016-05-29 DIAGNOSIS — Z87891 Personal history of nicotine dependence: Secondary | ICD-10-CM

## 2016-05-29 DIAGNOSIS — K761 Chronic passive congestion of liver: Secondary | ICD-10-CM | POA: Diagnosis present

## 2016-05-29 DIAGNOSIS — I248 Other forms of acute ischemic heart disease: Secondary | ICD-10-CM | POA: Diagnosis present

## 2016-05-29 DIAGNOSIS — T464X6A Underdosing of angiotensin-converting-enzyme inhibitors, initial encounter: Secondary | ICD-10-CM | POA: Diagnosis present

## 2016-05-29 DIAGNOSIS — Z95810 Presence of automatic (implantable) cardiac defibrillator: Secondary | ICD-10-CM

## 2016-05-29 DIAGNOSIS — I5023 Acute on chronic systolic (congestive) heart failure: Secondary | ICD-10-CM | POA: Diagnosis present

## 2016-05-29 DIAGNOSIS — R188 Other ascites: Secondary | ICD-10-CM | POA: Diagnosis present

## 2016-05-29 DIAGNOSIS — F1210 Cannabis abuse, uncomplicated: Secondary | ICD-10-CM | POA: Diagnosis present

## 2016-05-29 DIAGNOSIS — E119 Type 2 diabetes mellitus without complications: Secondary | ICD-10-CM | POA: Diagnosis present

## 2016-05-29 DIAGNOSIS — Z9119 Patient's noncompliance with other medical treatment and regimen: Secondary | ICD-10-CM

## 2016-05-29 DIAGNOSIS — Z79899 Other long term (current) drug therapy: Secondary | ICD-10-CM

## 2016-05-29 DIAGNOSIS — I509 Heart failure, unspecified: Secondary | ICD-10-CM | POA: Diagnosis not present

## 2016-05-29 DIAGNOSIS — E876 Hypokalemia: Secondary | ICD-10-CM | POA: Diagnosis present

## 2016-05-29 DIAGNOSIS — J45909 Unspecified asthma, uncomplicated: Secondary | ICD-10-CM | POA: Diagnosis present

## 2016-05-29 DIAGNOSIS — I272 Pulmonary hypertension, unspecified: Secondary | ICD-10-CM | POA: Diagnosis present

## 2016-05-29 DIAGNOSIS — F1010 Alcohol abuse, uncomplicated: Secondary | ICD-10-CM | POA: Diagnosis present

## 2016-05-29 MED ORDER — METOPROLOL SUCCINATE ER 25 MG PO TB24: 25.0000 mg | ORAL_TABLET | Freq: Every day | ORAL | 6 refills | Status: DC

## 2016-05-29 NOTE — ED Triage Notes (Addendum)
Pt presents to ED with generalized abd pain and distension since earlier today with vomiting and diarrhea. Dark red blood noted in stool per pt and her visitor. Pt states she had noticed slightly start of symptoms yesterday while she was at appt with her cardiologist and was told if her symptoms worsened to please come to ED for further evaluation. Pt also reports having left sided chest pain daily. Hx of the same.

## 2016-05-30 ENCOUNTER — Inpatient Hospital Stay
Admission: EM | Admit: 2016-05-30 | Discharge: 2016-06-04 | DRG: 292 | Disposition: A | Discharge status: Home / Self Care | Payer: Medicare Other | Attending: Internal Medicine | Admitting: Internal Medicine

## 2016-05-30 ENCOUNTER — Emergency Department: Payer: Medicare Other

## 2016-05-30 DIAGNOSIS — R778 Other specified abnormalities of plasma proteins: Secondary | ICD-10-CM

## 2016-05-30 DIAGNOSIS — R188 Other ascites: Secondary | ICD-10-CM

## 2016-05-30 DIAGNOSIS — I5023 Acute on chronic systolic (congestive) heart failure: Secondary | ICD-10-CM

## 2016-05-30 DIAGNOSIS — E876 Hypokalemia: Secondary | ICD-10-CM

## 2016-05-30 DIAGNOSIS — I2510 Atherosclerotic heart disease of native coronary artery without angina pectoris: Secondary | ICD-10-CM | POA: Diagnosis present

## 2016-05-30 DIAGNOSIS — J45909 Unspecified asthma, uncomplicated: Secondary | ICD-10-CM | POA: Diagnosis present

## 2016-05-30 DIAGNOSIS — F1010 Alcohol abuse, uncomplicated: Secondary | ICD-10-CM | POA: Diagnosis present

## 2016-05-30 DIAGNOSIS — I5043 Acute on chronic combined systolic (congestive) and diastolic (congestive) heart failure: Secondary | ICD-10-CM | POA: Diagnosis present

## 2016-05-30 DIAGNOSIS — Z95810 Presence of automatic (implantable) cardiac defibrillator: Secondary | ICD-10-CM | POA: Diagnosis not present

## 2016-05-30 DIAGNOSIS — F319 Bipolar disorder, unspecified: Secondary | ICD-10-CM | POA: Diagnosis present

## 2016-05-30 DIAGNOSIS — F1210 Cannabis abuse, uncomplicated: Secondary | ICD-10-CM | POA: Diagnosis present

## 2016-05-30 DIAGNOSIS — R0602 Shortness of breath: Secondary | ICD-10-CM | POA: Diagnosis present

## 2016-05-30 DIAGNOSIS — R109 Unspecified abdominal pain: Secondary | ICD-10-CM | POA: Diagnosis not present

## 2016-05-30 DIAGNOSIS — I429 Cardiomyopathy, unspecified: Secondary | ICD-10-CM | POA: Diagnosis not present

## 2016-05-30 DIAGNOSIS — I509 Heart failure, unspecified: Secondary | ICD-10-CM

## 2016-05-30 DIAGNOSIS — I272 Pulmonary hypertension, unspecified: Secondary | ICD-10-CM | POA: Diagnosis present

## 2016-05-30 DIAGNOSIS — Z23 Encounter for immunization: Secondary | ICD-10-CM | POA: Diagnosis not present

## 2016-05-30 DIAGNOSIS — Z7982 Long term (current) use of aspirin: Secondary | ICD-10-CM | POA: Diagnosis not present

## 2016-05-30 DIAGNOSIS — R079 Chest pain, unspecified: Secondary | ICD-10-CM

## 2016-05-30 DIAGNOSIS — I248 Other forms of acute ischemic heart disease: Secondary | ICD-10-CM | POA: Diagnosis present

## 2016-05-30 DIAGNOSIS — I110 Hypertensive heart disease with heart failure: Secondary | ICD-10-CM | POA: Diagnosis not present

## 2016-05-30 DIAGNOSIS — R64 Cachexia: Secondary | ICD-10-CM | POA: Diagnosis present

## 2016-05-30 DIAGNOSIS — Z9114 Patient's other noncompliance with medication regimen: Secondary | ICD-10-CM | POA: Diagnosis not present

## 2016-05-30 DIAGNOSIS — Z79899 Other long term (current) drug therapy: Secondary | ICD-10-CM | POA: Diagnosis not present

## 2016-05-30 DIAGNOSIS — R748 Abnormal levels of other serum enzymes: Secondary | ICD-10-CM

## 2016-05-30 DIAGNOSIS — R52 Pain, unspecified: Secondary | ICD-10-CM

## 2016-05-30 DIAGNOSIS — R0902 Hypoxemia: Secondary | ICD-10-CM | POA: Diagnosis present

## 2016-05-30 DIAGNOSIS — Z87891 Personal history of nicotine dependence: Secondary | ICD-10-CM | POA: Diagnosis not present

## 2016-05-30 DIAGNOSIS — R7989 Other specified abnormal findings of blood chemistry: Secondary | ICD-10-CM

## 2016-05-30 DIAGNOSIS — K761 Chronic passive congestion of liver: Secondary | ICD-10-CM | POA: Diagnosis present

## 2016-05-30 DIAGNOSIS — Z8249 Family history of ischemic heart disease and other diseases of the circulatory system: Secondary | ICD-10-CM | POA: Diagnosis not present

## 2016-05-30 DIAGNOSIS — I428 Other cardiomyopathies: Secondary | ICD-10-CM

## 2016-05-30 DIAGNOSIS — K7460 Unspecified cirrhosis of liver: Secondary | ICD-10-CM | POA: Diagnosis present

## 2016-05-30 DIAGNOSIS — E119 Type 2 diabetes mellitus without complications: Secondary | ICD-10-CM | POA: Diagnosis present

## 2016-05-30 LAB — URINALYSIS COMPLETE WITH MICROSCOPIC (ARMC ONLY)
Bacteria, UA: NONE SEEN
Bilirubin Urine: NEGATIVE
GLUCOSE, UA: NEGATIVE mg/dL
Hgb urine dipstick: NEGATIVE
KETONES UR: NEGATIVE mg/dL
LEUKOCYTES UA: NEGATIVE
NITRITE: NEGATIVE
PROTEIN: 100 mg/dL — AB
SPECIFIC GRAVITY, URINE: 1.024 (ref 1.005–1.030)
pH: 6 (ref 5.0–8.0)

## 2016-05-30 LAB — COMPREHENSIVE METABOLIC PANEL
ALBUMIN: 3.8 g/dL (ref 3.5–5.0)
ALK PHOS: 79 U/L (ref 38–126)
ALT: 82 U/L — ABNORMAL HIGH (ref 14–54)
ANION GAP: 11 (ref 5–15)
AST: 28 U/L (ref 15–41)
BILIRUBIN TOTAL: 4.6 mg/dL — AB (ref 0.3–1.2)
BUN: 16 mg/dL (ref 6–20)
CALCIUM: 9 mg/dL (ref 8.9–10.3)
CO2: 26 mmol/L (ref 22–32)
Chloride: 104 mmol/L (ref 101–111)
Creatinine, Ser: 1 mg/dL (ref 0.44–1.00)
GLUCOSE: 150 mg/dL — AB (ref 65–99)
Potassium: 2.8 mmol/L — ABNORMAL LOW (ref 3.5–5.1)
Sodium: 141 mmol/L (ref 135–145)
TOTAL PROTEIN: 7.8 g/dL (ref 6.5–8.1)

## 2016-05-30 LAB — LIPASE, BLOOD
Lipase: 29 U/L (ref 11–51)
Lipase: 47 U/L (ref 11–51)

## 2016-05-30 LAB — BASIC METABOLIC PANEL
Anion gap: 10 (ref 5–15)
BUN: 17 mg/dL (ref 6–20)
CHLORIDE: 103 mmol/L (ref 101–111)
CO2: 25 mmol/L (ref 22–32)
CREATININE: 1.14 mg/dL — AB (ref 0.44–1.00)
Calcium: 8.5 mg/dL — ABNORMAL LOW (ref 8.9–10.3)
GFR calc Af Amer: >60 mL/min (ref >60)
GFR calc non Af Amer: 54 mL/min — ABNORMAL LOW (ref >60)
GLUCOSE: 134 mg/dL — AB (ref 65–99)
Potassium: 3.8 mmol/L (ref 3.5–5.1)
Sodium: 138 mmol/L (ref 135–145)

## 2016-05-30 LAB — CBC
HCT: 37.6 % (ref 35.0–47.0)
HEMOGLOBIN: 12 g/dL (ref 12.0–16.0)
MCH: 29.5 pg (ref 26.0–34.0)
MCHC: 32.1 g/dL (ref 32.0–36.0)
MCV: 92 fL (ref 80.0–100.0)
Platelets: 218 10*3/uL (ref 150–440)
RBC: 4.08 MIL/uL (ref 3.80–5.20)
RDW: 18.1 % — ABNORMAL HIGH (ref 11.5–14.5)
WBC: 6.5 10*3/uL (ref 3.6–11.0)

## 2016-05-30 LAB — MAGNESIUM: Magnesium: 1.8 mg/dL (ref 1.7–2.4)

## 2016-05-30 LAB — URINE DRUG SCREEN, QUALITATIVE (ARMC ONLY)
AMPHETAMINES, UR SCREEN: NOT DETECTED
Barbiturates, Ur Screen: NOT DETECTED
Benzodiazepine, Ur Scrn: NOT DETECTED
CANNABINOID 50 NG, UR ~~LOC~~: POSITIVE — AB
Cocaine Metabolite,Ur ~~LOC~~: NOT DETECTED
MDMA (ECSTASY) UR SCREEN: NOT DETECTED
METHADONE SCREEN, URINE: NOT DETECTED
Opiate, Ur Screen: POSITIVE — AB
Phencyclidine (PCP) Ur S: NOT DETECTED
Tricyclic, Ur Screen: NOT DETECTED

## 2016-05-30 LAB — TROPONIN I
TROPONIN I: 0.08 ng/mL — AB (ref <0.03)
Troponin I: 0.09 ng/mL (ref <0.03)
Troponin I: 0.09 ng/mL (ref <0.03)

## 2016-05-30 LAB — BRAIN NATRIURETIC PEPTIDE: B Natriuretic Peptide: >4500 pg/mL — ABNORMAL HIGH (ref 0.0–100.0)

## 2016-05-30 MED ORDER — ENOXAPARIN SODIUM 40 MG/0.4ML ~~LOC~~ SOLN
40.0000 mg | SUBCUTANEOUS | Status: DC
  Administered 2016-05-30 – 2016-06-03 (×5): 40 mg via SUBCUTANEOUS
  Filled 2016-05-30 (×5): qty 0.4

## 2016-05-30 MED ORDER — POTASSIUM CHLORIDE CRYS ER 20 MEQ PO TBCR
40.0000 meq | EXTENDED_RELEASE_TABLET | ORAL | Status: AC
  Administered 2016-05-30: 40 meq via ORAL
  Filled 2016-05-30: qty 2

## 2016-05-30 MED ORDER — TRAMADOL HCL 50 MG PO TABS
50.0000 mg | ORAL_TABLET | Freq: Four times a day (QID) | ORAL | Status: DC | PRN
  Administered 2016-05-30 – 2016-06-02 (×8): 50 mg via ORAL
  Filled 2016-05-30 (×8): qty 1

## 2016-05-30 MED ORDER — IOPAMIDOL (ISOVUE-300) INJECTION 61%
30.0000 mL | Freq: Once | INTRAVENOUS | Status: AC | PRN
  Administered 2016-05-30: 30 mL via ORAL

## 2016-05-30 MED ORDER — FUROSEMIDE 10 MG/ML IJ SOLN
40.0000 mg | Freq: Two times a day (BID) | INTRAMUSCULAR | Status: DC
  Administered 2016-05-30 – 2016-05-31 (×4): 40 mg via INTRAVENOUS
  Filled 2016-05-30 (×4): qty 4

## 2016-05-30 MED ORDER — MORPHINE SULFATE (PF) 10 MG/ML IV SOLN
INTRAVENOUS | Status: AC
  Administered 2016-05-30: 4 mg
  Filled 2016-05-30: qty 1

## 2016-05-30 MED ORDER — QUETIAPINE FUMARATE 25 MG PO TABS
300.0000 mg | ORAL_TABLET | Freq: Every day | ORAL | Status: DC
  Administered 2016-05-30 – 2016-06-03 (×5): 300 mg via ORAL
  Filled 2016-05-30 (×5): qty 1

## 2016-05-30 MED ORDER — POTASSIUM CHLORIDE CRYS ER 20 MEQ PO TBCR
40.0000 meq | EXTENDED_RELEASE_TABLET | Freq: Once | ORAL | Status: AC
  Administered 2016-05-30: 40 meq via ORAL
  Filled 2016-05-30: qty 2

## 2016-05-30 MED ORDER — IOPAMIDOL (ISOVUE-300) INJECTION 61%
100.0000 mL | Freq: Once | INTRAVENOUS | Status: AC | PRN
  Administered 2016-05-30: 100 mL via INTRAVENOUS

## 2016-05-30 MED ORDER — SPIRONOLACTONE 25 MG PO TABS: 25.0000 mg | ORAL_TABLET | Freq: Every day | ORAL | Status: DC

## 2016-05-30 MED ORDER — POLYETHYLENE GLYCOL 3350 17 G PO PACK
17.0000 g | PACK | Freq: Every day | ORAL | Status: DC | PRN
  Administered 2016-05-30: 17 g via ORAL
  Filled 2016-05-30: qty 1

## 2016-05-30 MED ORDER — ONDANSETRON HCL 4 MG PO TABS
4.0000 mg | ORAL_TABLET | Freq: Four times a day (QID) | ORAL | Status: DC | PRN
  Administered 2016-06-01: 4 mg via ORAL
  Filled 2016-05-30: qty 1

## 2016-05-30 MED ORDER — CARVEDILOL 3.125 MG PO TABS
3.1250 mg | ORAL_TABLET | Freq: Two times a day (BID) | ORAL | Status: DC
  Administered 2016-05-30 – 2016-06-04 (×11): 3.125 mg via ORAL
  Filled 2016-05-30 (×11): qty 1

## 2016-05-30 MED ORDER — ACETAMINOPHEN 650 MG RE SUPP: 650.0000 mg | Freq: Four times a day (QID) | RECTAL | Status: DC | PRN

## 2016-05-30 MED ORDER — SODIUM CHLORIDE 0.9% FLUSH
3.0000 mL | Freq: Two times a day (BID) | INTRAVENOUS | Status: DC
  Administered 2016-06-01 – 2016-06-02 (×3): 3 mL via INTRAVENOUS

## 2016-05-30 MED ORDER — ATORVASTATIN CALCIUM 20 MG PO TABS
40.0000 mg | ORAL_TABLET | Freq: Every day | ORAL | Status: DC
  Administered 2016-05-30 – 2016-06-04 (×6): 40 mg via ORAL
  Filled 2016-05-30 (×6): qty 2

## 2016-05-30 MED ORDER — MORPHINE SULFATE (PF) 4 MG/ML IV SOLN: 4.0000 mg | Freq: Once | INTRAVENOUS | Status: DC

## 2016-05-30 MED ORDER — SODIUM CHLORIDE 0.9% FLUSH
3.0000 mL | Freq: Two times a day (BID) | INTRAVENOUS | Status: DC
  Administered 2016-05-30 – 2016-06-04 (×11): 3 mL via INTRAVENOUS

## 2016-05-30 MED ORDER — ASPIRIN EC 81 MG PO TBEC
81.0000 mg | DELAYED_RELEASE_TABLET | Freq: Every day | ORAL | Status: DC
  Administered 2016-05-30 – 2016-06-04 (×6): 81 mg via ORAL
  Filled 2016-05-30 (×6): qty 1

## 2016-05-30 MED ORDER — ACETAMINOPHEN 325 MG PO TABS: 650.0000 mg | ORAL_TABLET | Freq: Four times a day (QID) | ORAL | Status: DC | PRN

## 2016-05-30 MED ORDER — PRAZOSIN HCL 1 MG PO CAPS
1.0000 mg | ORAL_CAPSULE | Freq: Every day | ORAL | Status: DC
  Administered 2016-05-30 – 2016-06-03 (×5): 1 mg via ORAL
  Filled 2016-05-30 (×5): qty 1

## 2016-05-30 MED ORDER — PANTOPRAZOLE SODIUM 40 MG PO TBEC
40.0000 mg | DELAYED_RELEASE_TABLET | Freq: Every day | ORAL | Status: DC
  Administered 2016-05-30 – 2016-06-04 (×6): 40 mg via ORAL
  Filled 2016-05-30 (×6): qty 1

## 2016-05-30 MED ORDER — POTASSIUM CHLORIDE 20 MEQ PO PACK
40.0000 meq | PACK | Freq: Two times a day (BID) | ORAL | Status: DC
  Administered 2016-05-30: 40 meq via ORAL
  Filled 2016-05-30: qty 2

## 2016-05-30 MED ORDER — SODIUM CHLORIDE 0.9 % IV SOLN: 250.0000 mL | INTRAVENOUS | Status: DC | PRN

## 2016-05-30 MED ORDER — SODIUM CHLORIDE 0.9% FLUSH
3.0000 mL | INTRAVENOUS | Status: DC | PRN
  Administered 2016-06-02: 3 mL via INTRAVENOUS
  Filled 2016-05-30: qty 3

## 2016-05-30 MED ORDER — MORPHINE SULFATE (PF) 2 MG/ML IV SOLN: 2.0000 mg | Freq: Once | INTRAVENOUS | Status: DC

## 2016-05-30 MED ORDER — DOCUSATE SODIUM 100 MG PO CAPS
100.0000 mg | ORAL_CAPSULE | Freq: Two times a day (BID) | ORAL | Status: DC
  Administered 2016-05-30 – 2016-06-03 (×7): 100 mg via ORAL
  Filled 2016-05-30 (×10): qty 1

## 2016-05-30 MED ORDER — OMEGA-3-ACID ETHYL ESTERS 1 G PO CAPS
1.0000 | ORAL_CAPSULE | Freq: Two times a day (BID) | ORAL | Status: DC
  Administered 2016-05-30 – 2016-06-04 (×11): 1 g via ORAL
  Filled 2016-05-30 (×11): qty 1

## 2016-05-30 MED ORDER — SODIUM CHLORIDE 0.9 % IV BOLUS (SEPSIS)
1000.0000 mL | Freq: Once | INTRAVENOUS | Status: AC
  Administered 2016-05-30: 1000 mL via INTRAVENOUS

## 2016-05-30 MED ORDER — NITROGLYCERIN 0.4 MG SL SUBL
SUBLINGUAL_TABLET | SUBLINGUAL | Status: AC
  Administered 2016-05-30: 0.4 mg via SUBLINGUAL
  Filled 2016-05-30: qty 1

## 2016-05-30 MED ORDER — ONDANSETRON HCL 4 MG/2ML IJ SOLN: 4.0000 mg | Freq: Four times a day (QID) | INTRAMUSCULAR | Status: DC | PRN

## 2016-05-30 MED ORDER — MORPHINE SULFATE (PF) 2 MG/ML IV SOLN
0.5000 mg | Freq: Once | INTRAVENOUS | Status: AC
  Administered 2016-05-30: 0.5 mg via INTRAVENOUS
  Filled 2016-05-30: qty 1

## 2016-05-30 MED ORDER — PAROXETINE HCL 20 MG PO TABS
30.0000 mg | ORAL_TABLET | Freq: Every day | ORAL | Status: DC
  Administered 2016-05-30 – 2016-06-04 (×6): 30 mg via ORAL
  Filled 2016-05-30 (×6): qty 2

## 2016-05-30 MED ORDER — LISINOPRIL 20 MG PO TABS
20.0000 mg | ORAL_TABLET | Freq: Every day | ORAL | Status: DC
  Administered 2016-05-30 – 2016-06-04 (×6): 20 mg via ORAL
  Filled 2016-05-30 (×6): qty 1

## 2016-05-30 MED ORDER — NITROGLYCERIN 0.4 MG SL SUBL
0.4000 mg | SUBLINGUAL_TABLET | SUBLINGUAL | Status: DC | PRN
  Administered 2016-05-30: 0.4 mg via SUBLINGUAL

## 2016-05-30 MED ORDER — SPIRONOLACTONE 25 MG PO TABS
25.0000 mg | ORAL_TABLET | Freq: Every day | ORAL | Status: DC
  Administered 2016-05-30 – 2016-06-04 (×6): 25 mg via ORAL
  Filled 2016-05-30 (×6): qty 1

## 2016-05-30 MED ORDER — MAGNESIUM SULFATE 2 GM/50ML IV SOLN
2.0000 g | Freq: Once | INTRAVENOUS | Status: AC
  Administered 2016-05-30: 2 g via INTRAVENOUS
  Filled 2016-05-30: qty 50

## 2016-05-30 MED ORDER — ONDANSETRON HCL 4 MG/2ML IJ SOLN
4.0000 mg | Freq: Once | INTRAMUSCULAR | Status: AC
  Administered 2016-05-30: 4 mg via INTRAVENOUS
  Filled 2016-05-30: qty 2

## 2016-05-30 MED ORDER — INFLUENZA VAC SPLIT QUAD 0.5 ML IM SUSY
0.5000 mL | PREFILLED_SYRINGE | INTRAMUSCULAR | Status: AC
  Administered 2016-05-31: 0.5 mL via INTRAMUSCULAR
  Filled 2016-05-30: qty 0.5

## 2016-05-30 NOTE — H&P (Signed)
[facility] [place] at [facility]   PATIENT NAME: Bonnie Ochoa    MR#:  [id number]  DATE OF BIRTH:  24-Jan-1966  DATE OF ADMISSION:  05/30/2016  PRIMARY CARE PHYSICIAN: No PCP Per Patient   REQUESTING/REFERRING PHYSICIAN: dr Zenda Alpers  CHIEF COMPLAINT:   IncreasingShortness of breath and abdominal pain on and off for a few days HISTORY OF PRESENT ILLNESS:  Bonnie Ochoa  is a 50 y.o. female with a known history of Chronic systolic congestive heart failure, pulmonary hypertension, reported not each, to make cardiomyopathy status post ICD implantation in January 2016, hypertensive heart disease, prior history of heavy alcohol abuse, type 2 diabetes, bipolar disorder, comes to the emergency room with increasing abdominal pain, shortness of breath and chest pain. She was recently seen as outpatient by GI, Dr. Servando Snare and Dr. Lewie Loron, and was restarted back on her medications. Patient had ran out off her on Lasix, spironolactone for last several days. She moved from [place] 3 months ago. She did have her medication supply. She did not establish a primary care physician here, reason unknown. Patient was found to be in right-sided congestive heart failure, acute on chronic systolic internal medicine was consulted. CT of the abdomen was started which shows congestive hepatomegaly and mild inflammation in her colon appears secondary to severe CHF and right/right-sided heart failure   PAST MEDICAL HISTORY:   Past Medical History:  Diagnosis Date  . Asthma   . Bipolar 1 disorder (HCC)   . CHF (congestive heart failure) (HCC)   . Coronary artery disease   . Depression   . Diabetes mellitus without complication (HCC)   . Hypertension     PAST SURGICAL HISTOIRY:   Past Surgical History:  Procedure Laterality Date  . ABDOMINAL HYSTERECTOMY    . debribalator  2016  . hysterectmy  2011  . INSERT / REPLACE / REMOVE PACEMAKER    . INSERTION OF ICD    . TONSILECTOMY,  ADENOIDECTOMY, BILATERAL MYRINGOTOMY AND TUBES    . TUBAL LIGATION  1980    SOCIAL HISTORY:   Social History  Substance Use Topics  . Smoking status: Former Games developer  . Smokeless tobacco: Never Used  . Alcohol use No    FAMILY HISTORY:   Family History  Problem Relation Age of Onset  . Hypertension Mother   . Hyperlipidemia Mother     DRUG ALLERGIES:  No Known Allergies  REVIEW OF SYSTEMS:  Review of Systems  Constitutional: Negative for chills, fever and weight loss.  HENT: Negative for ear discharge, ear pain and nosebleeds.   Eyes: Negative for blurred vision, pain and discharge.  Respiratory: Positive for shortness of breath. Negative for sputum production, wheezing and stridor.   Cardiovascular: Positive for chest pain, leg swelling and PND. Negative for palpitations and orthopnea.  Gastrointestinal: Positive for abdominal pain and nausea. Negative for diarrhea and vomiting.  Genitourinary: Negative for frequency and urgency.  Musculoskeletal: Negative for back pain and joint pain.  Neurological: Positive for weakness. Negative for sensory change, speech change and focal weakness.  Psychiatric/Behavioral: Negative for depression and hallucinations. The patient is not nervous/anxious.      MEDICATIONS AT HOME:   Prior to Admission medications   Medication Sig Start Date End Date Taking? Authorizing Provider  aspirin EC 81 MG tablet Take 81 mg by mouth daily.   Yes Historical Provider, MD  atorvastatin (LIPITOR) 40 MG tablet Take 40 mg by mouth daily.   Yes Historical Provider, MD  furosemide (  LASIX) 40 MG tablet Take 1 tablet (40 mg total) by mouth daily. Patient taking differently: Take 40 mg by mouth 2 (two) times daily.  05/28/16  Yes Antonieta Iba, MD  lisinopril (PRINIVIL,ZESTRIL) 20 MG tablet Take 20 mg by mouth daily.   Yes Historical Provider, MD  metoprolol succinate (TOPROL-XL) 25 MG 24 hr tablet Take 1 tablet (25 mg total) by mouth daily. 05/29/16  08/27/16 Yes Antonieta Iba, MD  omega-3 acid ethyl esters (LOVAZA) 1 g capsule Take 1 capsule by mouth 2 (two) times daily.   Yes Historical Provider, MD  pantoprazole (PROTONIX) 40 MG tablet Take 1 tablet (40 mg total) by mouth daily. 05/24/16 05/24/17 Yes Darren Servando Snare, MD  PARoxetine (PAXIL) 30 MG tablet Take 30 mg by mouth daily.   Yes Historical Provider, MD  potassium chloride (KLOR-CON) 20 MEQ packet Take 20 mEq by mouth 2 (two) times daily.   Yes Historical Provider, MD  potassium chloride SA (KLOR-CON M20) 20 MEQ tablet Take 1 tablet (20 mEq total) by mouth daily. 05/28/16 08/26/16 Yes Antonieta Iba, MD  prazosin (MINIPRESS) 1 MG capsule Take 1 mg by mouth at bedtime.   Yes Historical Provider, MD  QUEtiapine (SEROQUEL) 300 MG tablet Take 300 mg by mouth at bedtime.   Yes Historical Provider, MD  spironolactone (ALDACTONE) 25 MG tablet Take 1 tablet (25 mg total) by mouth daily. 05/24/16  Yes Darren Servando Snare, MD  ondansetron (ZOFRAN) 4 MG tablet Take 1 tablet (4 mg total) by mouth daily as needed for nausea or vomiting. 04/30/16   Emily Filbert, MD      VITAL SIGNS:  Blood pressure (!) 148/95, pulse 68, temperature 97.6 F (36.4 C), temperature source Oral, resp. rate 18, height 5\' 5"  (1.651 m), weight 77.6 kg (171 lb), SpO2 96 %.  PHYSICAL EXAMINATION:  GENERAL:  50 y.o.-year-old patient lying in the bed with no acute distress.  EYES: Pupils equal, round, reactive to light and accommodation. No scleral icterus. Extraocular muscles intact. pallor HEENT: Head atraumatic, normocephalic. Oropharynx and nasopharynx clear.  NECK:  Supple, no jugular venous distention. No thyroid enlargement, no tenderness.  LUNGS: decreased breath sounds bilaterally, no wheezing, rales,rhonchi or crepitation. No use of accessory muscles of respiration.  CARDIOVASCULAR: S1, S2 normal. No murmurs, rubs, or gallops.  ABDOMEN: Soft, nontender, nondistended. Bowel sounds present. No organomegaly or mass.no  ascites  EXTREMITIES:++ pedal edema, no cyanosis, or clubbing.  NEUROLOGIC: Cranial nerves II through XII are intact. Muscle strength 5/5 in all extremities. Sensation intact. Gait not checked.  PSYCHIATRIC: The patient is alert and oriented x 3.  SKIN: No obvious rash, lesion, or ulcer.   LABORATORY PANEL:   CBC  Recent Labs Lab 05/29/16 2354  WBC 6.5  HGB 12.0  HCT 37.6  PLT 218   ------------------------------------------------------------------------------------------------------------------  Chemistries   Recent Labs Lab 05/29/16 2354 05/30/16 0731 05/30/16 0737  NA 141  --  138  K 2.8*  --  3.8  CL 104  --  103  CO2 26  --  25  GLUCOSE 150*  --  134*  BUN 16  --  17  CREATININE 1.00  --  1.14*  CALCIUM 9.0  --  8.5*  MG  --  1.8  --   AST 28  --   --   ALT 82*  --   --   ALKPHOS 79  --   --   BILITOT 4.6*  --   --    ------------------------------------------------------------------------------------------------------------------  Cardiac Enzymes  Recent Labs Lab 05/30/16 0731  TROPONINI 0.09*   ------------------------------------------------------------------------------------------------------------------  RADIOLOGY:  Dg Chest 2 View  Result Date: 05/30/2016 CLINICAL DATA:  Acute onset of generalized abdominal pain and distention. Vomiting and diarrhea. Dark red blood noted in stool. Initial encounter. EXAM: CHEST  2 VIEW COMPARISON:  Chest radiograph performed 05/19/2016 FINDINGS: The lungs are well-aerated. Vascular congestion is noted. Increased interstitial markings raise concern for mild interstitial edema. There is no evidence of pleural effusion or pneumothorax. The heart is mildly enlarged. An AICD is noted at the left chest wall, with a single lead ending at the right ventricle. No acute osseous abnormalities are seen. IMPRESSION: Vascular congestion and mild cardiomegaly. Increased interstitial markings raise concern for mild interstitial  edema. Electronically Signed   By: Roanna Raider M.D.   On: 05/30/2016 00:24   Ct Abdomen Pelvis W Contrast  Result Date: 05/30/2016 CLINICAL DATA:  50 year old female with generalized abdominal pain, vomiting, and diarrhea. Jaundice. EXAM: CT ABDOMEN AND PELVIS WITH CONTRAST TECHNIQUE: Multidetector CT imaging of the abdomen and pelvis was performed using the standard protocol following bolus administration of intravenous contrast. CONTRAST:  ISOVUE-300 IOPAMIDOL (ISOVUE-300) INJECTION 61% COMPARISON:  Abdominal CT dated 05/14/2016 FINDINGS: Lower chest: Small right pleural effusion. Left lung base linear atelectasis/scarring. The visualized lung bases are otherwise clear. There is cardiomegaly with biatrial enlargement. A pacemaker wires partially visualized. There is retrograde flow of contrast from the right atrium into the IVC compatible with a degree of right cardiac dysfunction. Correlation with echocardiogram recommended. No intra-abdominal free air.  Small ascites. Hepatobiliary: Heterogeneous enhancement of the liver with nutmeg appearance most compatible with congestive hepatopathy. There is mild irregularity of the hepatic contour which may be related to underlying cirrhosis. Correlation with clinical exam recommended. No intrahepatic biliary ductal dilatation. No gallstone. Pancreas: Unremarkable. No pancreatic ductal dilatation or surrounding inflammatory changes. Spleen: Normal in size without focal abnormality. Adrenals/Urinary Tract: Adrenal glands are unremarkable. Kidneys are normal, without renal calculi, focal lesion, or hydronephrosis. Bladder is unremarkable. Stomach/Bowel: Oral contrast noted within the stomach and opacifying multiple loops of small bowel and traversing into the colon without evidence of bowel obstruction. Mildly thickened loops of small bowel may be related to underdistention or hepatic enteropathy. Enteritis is less likely but not entirely excluded. Clinical  correlation is recommended. Normal appendix. Vascular/Lymphatic: The abdominal aorta appears unremarkable. The origins of the celiac axis, SMA, IMA as well as the origins of the renal arteries are patent. No portal venous gas identified. There is no adenopathy. Reproductive: Hysterectomy. Other: There is diffuse subcutaneous edema and anasarca. Musculoskeletal: Multilevel degenerative changes of the spine most prominent at L4-L5 where there is endplate irregularity and sclerosis. No acute fracture. IMPRESSION: Cardiomegaly with evidence of right cardiac dysfunction. Correlation with echocardiogram recommended. Congestive hepatopathy with possible mild underlying cirrhosis. Correlation with clinical exam and liver function tests recommended. Ultrasound may provide better evaluation of the liver contour. Small right pleural effusion, small ascites, and anasarca. Segmental thickening of the small bowel in the mid abdomen may be related to underdistention or hepatic enteropathy. Enteritis is not excluded. Clinical correlation is recommended. No bowel obstruction. Normal appendix. Electronically Signed   By: Elgie Collard M.D.   On: 05/30/2016 06:06    EKG:   Normal sinus rhythm. Mildly Prolonged QTC at 475, nonspecific inferolateral changes IMPRESSION AND PLAN:   Mylo Kowalke  is a 50 y.o. female with a known history of Chronic systolic congestive heart failure, pulmonary hypertension, reported  not each, to make cardiomyopathy status post ICD implantation in January 2016, hypertensive heart disease, prior history of heavy alcohol abuse, type 2 diabetes, bipolar disorder, comes to the emergency room with increasing abdominal pain, shortness of breath and chest pain.  1. Acute on chronic systolic congestive heart failure/pulmonary hypertension with history of nonischemic myopathy, EF of 20-35% by recent echo, status post ICD placement -Given her recent workup, elevated BNP, elevated weight, and bilateral  lower extremity edema. Patient is to be volume overloaded. -IV Lasix twice a day -Cardiology consultation. Appreciate at -Increase Trileptal to 25 daily -Patient will need CHF follow-up clinic  2. Abdominal pain -Appears cardiac cachexia secondary to hepatic congestion due to CHF and intestinal congestion -CT abdomen 2 recently done along with ultrasound of the abdomen has been negative for any acute pathology -Continue IV diuretics.   3. Hypokalemia -By mouth repletion  4. Ascites/anasarca/congestive heart petechia and with history of prior alcohol abuse, likely has underlying mild cirrhosis -Patient's last paracentesis in 2016. She does not seem to have acute ascites at present. Continue to monitor. Resume late Lasix and Aldactone.  5. Elevated troponin appears demand ischemia in the setting of systolic heart failure  6. Marijuana abuse -Patient reportedly takes every now and then, last use was yesterday. She denies any other cocaine or alcohol use -We'll check urine drug screen  7. DVT prophylaxis subcutaneous Lovenox   All the records are reviewed and case discussed with ED provider. Management plans discussed with the patient, family and they are in agreement.  CODE STATUS:Full  TOTAL TIME TAKING CARE OF THIS PATIENT:50s.    Aianna Fahs M.D on 05/30/2016 at 3:21 PM  Between 7am to 6pm - Pager - [phone]  After 6pm go to [email or web address] - password [email or web address] [facility]  [facility]  Office  [phone]  CC: Primary care physician; No PCP Per Patient

## 2016-05-30 NOTE — Progress Notes (Signed)
Pharmacy tech notified of need to complete PTA med list.

## 2016-05-30 NOTE — ED Notes (Signed)
MD Brown at bedside.

## 2016-05-30 NOTE — ED Provider Notes (Signed)
[facility] Emergency Department Provider Note    First MD Initiated Contact with Patient 05/30/16 0119     (approximate)  I have reviewed the triage vital signs and the nursing notes.   HISTORY  Chief Complaint Abdominal Pain; Emesis; and Chest Pain    HPI Bonnie Ochoa is a 50 y.o. female with history of coronary artery disease hypertension diabetes bipolar disorder and CHF presents to the emergency department with generalized abdominal pain and distention accompanied with vomiting and diarrhea times today. Patient also admits to persistent left-sided chest pain times one week. Patient does admit to dyspnea and orthopnea. Current pain score 8 out of 10   Past Medical History:  Diagnosis Date  . Asthma   . Bipolar 1 disorder (HCC)   . CHF (congestive heart failure) (HCC)   . Coronary artery disease   . Depression   . Diabetes mellitus without complication (HCC)   . Hypertension     Patient Active Problem List   Diagnosis Date Noted  . Acute on chronic systolic CHF (congestive heart failure) (HCC) 05/30/2016  . Pulmonary hypertension 05/30/2016  . Hypokalemia 05/30/2016  . NICM (nonischemic cardiomyopathy) (HCC) 05/30/2016  . Elevated troponin   . Other ascites   . Hypertension   . Diabetes mellitus without complication (HCC)   . Depression   . Coronary artery disease   . Bipolar 1 disorder (HCC)   . Asthma   . Generalized abdominal pain 05/07/2016    Past Surgical History:  Procedure Laterality Date  . ABDOMINAL HYSTERECTOMY    . debribalator  2016  . hysterectmy  2011  . INSERT / REPLACE / REMOVE PACEMAKER    . INSERTION OF ICD    . TONSILECTOMY, ADENOIDECTOMY, BILATERAL MYRINGOTOMY AND TUBES    . TUBAL LIGATION  1980    Prior to Admission medications   Medication Sig Start Date End Date Taking? Authorizing Provider  aspirin EC 81 MG tablet Take 81 mg by mouth daily.   Yes Historical Provider, MD  atorvastatin (LIPITOR) 40 MG  tablet Take 40 mg by mouth daily.   Yes Historical Provider, MD  furosemide (LASIX) 40 MG tablet Take 1 tablet (40 mg total) by mouth daily. Patient taking differently: Take 40 mg by mouth 2 (two) times daily.  05/28/16  Yes Antonieta Iba, MD  lisinopril (PRINIVIL,ZESTRIL) 20 MG tablet Take 20 mg by mouth daily.   Yes Historical Provider, MD  metoprolol succinate (TOPROL-XL) 25 MG 24 hr tablet Take 1 tablet (25 mg total) by mouth daily. 05/29/16 08/27/16 Yes Antonieta Iba, MD  omega-3 acid ethyl esters (LOVAZA) 1 g capsule Take 1 capsule by mouth 2 (two) times daily.   Yes Historical Provider, MD  pantoprazole (PROTONIX) 40 MG tablet Take 1 tablet (40 mg total) by mouth daily. 05/24/16 05/24/17 Yes Darren Servando Snare, MD  PARoxetine (PAXIL) 30 MG tablet Take 30 mg by mouth daily.   Yes Historical Provider, MD  potassium chloride (KLOR-CON) 20 MEQ packet Take 20 mEq by mouth 2 (two) times daily.   Yes Historical Provider, MD  potassium chloride SA (KLOR-CON M20) 20 MEQ tablet Take 1 tablet (20 mEq total) by mouth daily. 05/28/16 08/26/16 Yes Antonieta Iba, MD  prazosin (MINIPRESS) 1 MG capsule Take 1 mg by mouth at bedtime.   Yes Historical Provider, MD  QUEtiapine (SEROQUEL) 300 MG tablet Take 300 mg by mouth at bedtime.   Yes Historical Provider, MD  spironolactone (ALDACTONE) 25 MG tablet Take  1 tablet (25 mg total) by mouth daily. 05/24/16  Yes Darren Servando SnareWohl, MD  ondansetron (ZOFRAN) 4 MG tablet Take 1 tablet (4 mg total) by mouth daily as needed for nausea or vomiting. 04/30/16   Emily FilbertJonathan E Williams, MD    Allergies No known drug allergies  Family History  Problem Relation Age of Onset  . Hypertension Mother   . Hyperlipidemia Mother     Social History Social History  Substance Use Topics  . Smoking status: Former Games developermoker  . Smokeless tobacco: Never Used  . Alcohol use No    Review of Systems Constitutional: No fever/chills Eyes: No visual changes. ENT: No sore  throat. Cardiovascular: Positive for chest pain. Respiratory: Denies shortness of breath. Gastrointestinal: Positive for abdominal pain and vomiting Genitourinary: Negative for dysuria. Musculoskeletal: Negative for back pain. Skin: Negative for rash. Neurological: Negative for headaches, focal weakness or numbness.  10-point ROS otherwise negative.  ____________________________________________   PHYSICAL EXAM:  VITAL SIGNS: ED Triage Vitals  Enc Vitals Group     BP 05/29/16 2326 (!) 153/97     Pulse Rate 05/29/16 2326 76     Resp 05/29/16 2326 (!) 24     Temp 05/29/16 2326 97.9 F (36.6 C)     Temp Source 05/29/16 2326 Oral     SpO2 05/29/16 2326 97 %     Weight 05/29/16 2327 171 lb (77.6 kg)     Height 05/29/16 2327 5\' 5"  (1.651 m)     Head Circumference --      Peak Flow --      Pain Score 05/29/16 2327 10     Pain Loc --      Pain Edu? --      Excl. in GC? --     Constitutional: Alert and oriented.Apparent discomfort Eyes: Conjunctivae are normal. PERRL. EOMI. Head: Atraumatic. Mouth/Throat: Mucous membranes are moist.  Oropharynx non-erythematous. Neck: No stridor.  No meningeal signs.   Cardiovascular: Normal rate, regular rhythm. Good peripheral circulation. Grossly normal heart sounds. Respiratory: Normal respiratory effort.  No retractions. Lungs CTAB. Gastrointestinal: Generalized tenderness to palpation No distention.  Musculoskeletal: No lower extremity tenderness nor edema. No gross deformities of extremities. Neurologic:  Normal speech and language. No gross focal neurologic deficits are appreciated.  Skin:  Skin is warm, dry and intact. No rash noted. Psychiatric: Mood and affect are normal. Speech and behavior are normal.  ____________________________________________   LABS (all labs ordered are listed, but only abnormal results are displayed)  Labs Reviewed  COMPREHENSIVE METABOLIC PANEL - Abnormal; Notable for the following:       Result Value    Potassium 2.8 (*)    Glucose, Bld 150 (*)    ALT 82 (*)    Total Bilirubin 4.6 (*)    All other components within normal limits  CBC - Abnormal; Notable for the following:    RDW 18.1 (*)    All other components within normal limits  URINALYSIS COMPLETEWITH MICROSCOPIC ([facility] ONLY) - Abnormal; Notable for the following:    Color, Urine YELLOW (*)    APPearance CLEAR (*)    Protein, ur 100 (*)    Squamous Epithelial / LPF 0-5 (*)    All other components within normal limits  TROPONIN I - Abnormal; Notable for the following:    Troponin I 0.09 (*)    All other components within normal limits  TROPONIN I - Abnormal; Notable for the following:    Troponin I 0.08 (*)  All other components within normal limits  BRAIN NATRIURETIC PEPTIDE - Abnormal; Notable for the following:    B Natriuretic Peptide >4,500.0 (*)    All other components within normal limits  TROPONIN I - Abnormal; Notable for the following:    Troponin I 0.09 (*)    All other components within normal limits  BASIC METABOLIC PANEL - Abnormal; Notable for the following:    Glucose, Bld 134 (*)    Creatinine, Ser 1.14 (*)    Calcium 8.5 (*)    GFR calc non Af Amer 54 (*)    All other components within normal limits  URINE DRUG SCREEN, QUALITATIVE ([facility] ONLY) - Abnormal; Notable for the following:    Opiate, Ur Screen POSITIVE (*)    Cannabinoid 50 Ng, Ur Silas POSITIVE (*)    All other components within normal limits  BASIC METABOLIC PANEL - Abnormal; Notable for the following:    Glucose, Bld 121 (*)    Creatinine, Ser 1.18 (*)    Calcium 8.6 (*)    GFR calc non Af Amer 52 (*)    All other components within normal limits  GASTROINTESTINAL PANEL BY PCR, STOOL (REPLACES STOOL CULTURE)  LIPASE, BLOOD  LIPASE, BLOOD  MAGNESIUM  MAGNESIUM  HEPATITIS PANEL, ACUTE   ____________________________________________  EKG  ED ECG REPORT I, Glasgow N Doss Cybulski, the attending physician, personally viewed and interpreted  this ECG.   Date: 05/31/2016  EKG Time: 11:55 PM  Rate: 73  Rhythm: Normal sinus rhythm  Axis: Normal  Intervals: Normal  ST&T Change: None  ____________________________________________  RADIOLOGY I, Worthington N Kylina Vultaggio, personally viewed and evaluated these images (plain radiographs) as part of my medical decision making, as well as reviewing the written report by the radiologist.  No results found.  __________________ Procedures     INITIAL IMPRESSION / ASSESSMENT AND PLAN / ED COURSE  Pertinent labs & imaging results that were available during my care of the patient were reviewed by me and considered in my medical decision making (see chart for details).  Given elevated troponin 0.09 and markedly elevated BNP patient will be admitted to the hospital for further evaluation and management   Clinical Course    ____________________________________________  FINAL CLINICAL IMPRESSION(S) / ED DIAGNOSES  Final diagnoses:  Acute on chronic congestive heart failure, unspecified congestive heart failure type (HCC)  Chest pain, unspecified type     MEDICATIONS GIVEN DURING THIS VISIT:  Medications  pantoprazole (PROTONIX) EC tablet 40 mg (40 mg Oral Given 05/30/16 1158)  aspirin EC tablet 81 mg (81 mg Oral Given 05/30/16 1205)  atorvastatin (LIPITOR) tablet 40 mg (40 mg Oral Given 05/30/16 1756)  lisinopril (PRINIVIL,ZESTRIL) tablet 20 mg (20 mg Oral Given 05/30/16 1158)  omega-3 acid ethyl esters (LOVAZA) capsule 1 g (1 g Oral Given 05/30/16 2242)  PARoxetine (PAXIL) tablet 30 mg (30 mg Oral Given 05/30/16 1158)  prazosin (MINIPRESS) capsule 1 mg (1 mg Oral Given 05/30/16 2242)  QUEtiapine (SEROQUEL) tablet 300 mg (300 mg Oral Given 05/30/16 2242)  enoxaparin (LOVENOX) injection 40 mg (40 mg Subcutaneous Given 05/30/16 2243)  sodium chloride flush (NS) 0.9 % injection 3 mL (0 mLs Intravenous Duplicate 05/30/16 2336)  sodium chloride flush (NS) 0.9 % injection 3 mL (3 mLs  Intravenous Given 05/30/16 2336)  sodium chloride flush (NS) 0.9 % injection 3 mL (not administered)  0.9 %  sodium chloride infusion (not administered)  acetaminophen (TYLENOL) tablet 650 mg (not administered)    Or  acetaminophen (  TYLENOL) suppository 650 mg (not administered)  traMADol (ULTRAM) tablet 50 mg (50 mg Oral Given 05/30/16 1756)  docusate sodium (COLACE) capsule 100 mg (100 mg Oral Not Given 05/30/16 2336)  polyethylene glycol (MIRALAX / GLYCOLAX) packet 17 g (17 g Oral Given 05/30/16 1159)  ondansetron (ZOFRAN) tablet 4 mg (not administered)    Or  ondansetron (ZOFRAN) injection 4 mg (not administered)  carvedilol (COREG) tablet 3.125 mg (3.125 mg Oral Given 05/31/16 0754)  furosemide (LASIX) injection 40 mg (40 mg Intravenous Given 05/31/16 0754)  spironolactone (ALDACTONE) tablet 25 mg (25 mg Oral Given 05/30/16 0927)  Influenza vac split quadrivalent PF (FLUARIX) injection 0.5 mL (not administered)  nitroGLYCERIN (NITROSTAT) SL tablet 0.4 mg (0.4 mg Sublingual Given 05/30/16 1144)  sodium chloride 0.9 % bolus 1,000 mL (0 mLs Intravenous Stopped 05/30/16 0514)  ondansetron (ZOFRAN) injection 4 mg (4 mg Intravenous Given 05/30/16 0153)  iopamidol (ISOVUE-300) 61 % injection 30 mL (30 mLs Oral Contrast Given 05/30/16 0210)  Morphine Sulfate (PF) 10 MG/ML SOLN (4 mg  Given 05/30/16 0153)  iopamidol (ISOVUE-300) 61 % injection 100 mL (100 mLs Intravenous Contrast Given 05/30/16 0540)  potassium chloride SA (K-DUR,KLOR-CON) CR tablet 40 mEq (40 mEq Oral Given 05/30/16 0927)  potassium chloride SA (K-DUR,KLOR-CON) CR tablet 40 mEq (40 mEq Oral Given 05/30/16 1509)  morphine 2 MG/ML injection 0.5 mg (0.5 mg Intravenous Given 05/30/16 1242)  magnesium sulfate IVPB 2 g 50 mL (2 g Intravenous Given 05/30/16 1509)     NEW OUTPATIENT MEDICATIONS STARTED DURING THIS VISIT:  Current Discharge Medication List      Current Discharge Medication List      Current Discharge  Medication List       Note:  This document was prepared using Dragon voice recognition software and may include unintentional dictation errors.    Darci Current, MD 05/31/16 [phone]

## 2016-05-30 NOTE — Progress Notes (Addendum)
MEDICATION RELATED CONSULT NOTE - INITIAL   Pharmacy Consult for K supp, Mag check Indication: hypokalemia  No Known Allergies  Patient Measurements: Height: 5\' 5"  (165.1 cm) Weight: 171 lb (77.6 kg) IBW/kg (Calculated) : 57   Vital Signs: Temp: 97.6 F (36.4 C) (10/18 1135) Temp Source: Oral (10/18 1135) BP: 148/95 (10/18 1135) Pulse Rate: 68 (10/18 1135) Intake/Output from previous day: No intake/output data recorded. Intake/Output from this shift: No intake/output data recorded.  Labs:  Recent Labs  05/29/16 2354  WBC 6.5  HGB 12.0  HCT 37.6  PLT 218  CREATININE 1.00  ALBUMIN 3.8  PROT 7.8  AST 28  ALT 82*  ALKPHOS 79  BILITOT 4.6*   Estimated Creatinine Clearance: 67.7 mL/min (by C-G formula based on SCr of 1 mg/dL).   Microbiology: No results found for this or any previous visit (from the past 720 hour(s)).  Medical History: Past Medical History:  Diagnosis Date  . Asthma   . Bipolar 1 disorder (HCC)   . CHF (congestive heart failure) (HCC)   . Coronary artery disease   . Depression   . Diabetes mellitus without complication (HCC)   . Hypertension       Assessment: 50 yo female with K of 2.8 on admission last night. Per consult replace K orally and also check Mag level. Pt has received KCl 40 mEq PO x2 doses so far.  Pt also ordered lasix 40 IV BID.  Goal of Therapy:  K ~4 per cards note Mag ~2  Plan:  Will order another KCl 40 mEq PO x1 at 1300 to target K ~4 Will add on Mag level  Pharmacy will continue to follow.   Marty Heck 05/30/2016,12:17 PM   Addendum: 10/18 mag add on 1.8 - will order mag sulfate 2 g IV x1 Recheck K and Mag in AM  Crist Fat, PharmD, BCPS Clinical Pharmacist 05/30/2016 3:20 PM

## 2016-05-30 NOTE — Progress Notes (Signed)
Pt. admitted to unit, [id number] from ED. Oriented to room, call bell, Ascom phones and staff. Bed in low position. Fall safety plan reviewed, low non-skid socks in place, bed alarm on. Full assessment to Epic; skin assessed with Thompson Grayer., RN. Telemetry box verified with CCMD and Natalie NT: [id number]. Will continue to monitor.   Patient reported 8/10 chest pressure, emergency standing orders activated, 1 SL nitro given with pain decreased to 0. EKG showed no acute changes, Dr. Allena Katz made aware. Patient still reporting 10/10 abdominal pain, Dr. Allena Katz also made aware, orders to add-on lipase level given alcohol abuse history and 0.5 IV morphine.   Enteric precautions continued, patient has had no bowel movement since yesterday; reports no nausea or vomiting.   Will continue to monitor.

## 2016-05-30 NOTE — Care Management Note (Signed)
Case Management Note  Patient Details  Name: Bonnie Ochoa MRN: [id number] Date of Birth: 12-16-1965  Subjective/Objective:  Case discussed with attending. RNCM assessment for home health needs not LTAC. Met with patient at bedside. She has recently moved here from [place]. Has out of state medicaid. Has started paperwork for [place] medicaid. She reports she is normally independent but recently has had increased number of falls secondary to increased weakness.  No PCP. Application given for [facility] and [place]. Email sent to [employer]. Prior to admission she has been paying for MD visits and medications out of pocket.  There is some documentation of noncompliance. Provided RNCM contact information should she need additional resources from [facility].                   Action/Plan: [facility] application given  Expected Discharge Date:                  Expected Discharge Plan:   In-House Referral:     Discharge planning Services  CM Consult  Post Acute Care Choice:    Choice offered to:     DME Arranged:    DME Agency:     HH Arranged:    HH Agency:     Status of Service:  Completed, signed off  If discussed at [employer] of Stay Meetings, dates discussed:    Additional Comments:  Jolly Mango, RN 05/30/2016, 2:18 PM

## 2016-05-30 NOTE — Consult Note (Signed)
Cardiology Consultation Note  Patient ID: Bonnie Ochoa, MRN: [id number], DOB/AGE: 1965-09-29 50 y.o. Admit date: 05/30/2016   Date of Consult: 05/30/2016 Primary Physician: No PCP Per Patient Primary Cardiologist: Dr. Mariah Milling, MD Requesting Physician: Dr. Allena Katz, MD  Chief Complaint: SOB, abdominal pain, chest pain Reason for Consult: Same  HPI: 50 y.o. female with h/o chronic systolic CHF, pulmonary hypertension, reported NICM s/p SJM ICD implanted in 08/2014 without any prior discharges, hypertensive heart disease, prior heavy alcohol abuse, DM2, Bipolar disorder, and asthma who recently moved to [facility] from [place] presented to [facility] on 05/30/16 with worsening SOB, abdominal pain, and chest pain.   She recently established with Dr. Mariah Milling, MD on 05/28/16 after moving to [facility] from [facility] in June. She had not seen a cardiologist since moving until then. She reports initially being diagnosed with CHF in 2008. She most recently reportedly underwent LHC in 08/2014 that she reports showed no obstructive CAD. She underwent SJM ICD implantation in 08/2014 as well. She has been managed on Lasix, spironolactone, and metoprolol as an outpatient, though apparently ran out of her medications after moving to [place]. These were recently restarted by Dr. Mariah Milling on 05/28/16. She has had multiple ED visits since moving to [facility] for abdominal pain, chest pain and SOB, most recently in early October in which she was found to have an elevated BNP of 3700 as well as moderate ascites. She reports having to previously undergo a paracentesis in [place] in 2016. She has seen GI regarding her abdominal pain as well as ascites seen on prior CT in early October and was restarted on her spironolactone and Lasix.    Upon the patient's arrival to [facility] they were found to have a BNP >4,500, troponin 0.09-->0.08-->0.09, unremarkable CBC, K+ 2.8, SCr 1.00, ALT 82, T bili 4.6, lipase 47. ECG as below, CXR showed vascular congestion with mild cardiomegaly and  mild interstitial edema. CT abdomen/pelvis showed congestive hepatopathy with possible mild underlying cirrhosis, small right pleural effusion, small ascites, and anasarca. She was started on IV Lasix 40 mg bid with KCl repletion.   Past Medical History:  Diagnosis Date  . Asthma   . Bipolar 1 disorder (HCC)   . CHF (congestive heart failure) (HCC)   . Coronary artery disease   . Depression   . Diabetes mellitus without complication (HCC)   . Hypertension       Most Recent Cardiac Studies: Echo 05/28/2016: Study Conclusions  - Left ventricle: The cavity size was normal. Systolic function was   severely reduced. The estimated ejection fraction was less than   25%. Diffuse hypokinesis. Regional wall motion abnormalities   cannot be excluded. Doppler parameters are consistent with   abnormal left ventricular relaxation. - Mitral valve: There was moderate regurgitation. - Left atrium: The atrium was moderately dilated. - Right ventricle: Pacer wire or catheter noted in right ventricle. - Right atrium: The atrium was mildly dilated. - Tricuspid valve: There was moderate regurgitation. - Pulmonary arteries: Systolic pressure was severely elevated. PA   peak pressure: 64 mm Hg (S). - Pericardium, extracardiac: A trivial pericardial effusion was   identified.   Surgical History:  Past Surgical History:  Procedure Laterality Date  . ABDOMINAL HYSTERECTOMY    . debribalator  2016  . hysterectmy  2011  . INSERT / REPLACE / REMOVE PACEMAKER    . INSERTION OF ICD    . TONSILECTOMY, ADENOIDECTOMY, BILATERAL MYRINGOTOMY AND TUBES    . TUBAL LIGATION  1980  Home Meds: Prior to Admission medications   Medication Sig Start Date End Date Taking? Authorizing Provider  aspirin EC 81 MG tablet Take 81 mg by mouth daily.   Yes Historical Provider, MD  atorvastatin (LIPITOR) 40 MG tablet Take 40 mg by mouth daily.   Yes Historical Provider, MD  furosemide (LASIX) 40 MG tablet Take 1  tablet (40 mg total) by mouth daily. 05/28/16  Yes Antonieta Ibaimothy J Gollan, MD  lisinopril (PRINIVIL,ZESTRIL) 20 MG tablet Take 20 mg by mouth daily.   Yes Historical Provider, MD  metoprolol succinate (TOPROL-XL) 25 MG 24 hr tablet Take 1 tablet (25 mg total) by mouth daily. 05/29/16 08/27/16 Yes Antonieta Ibaimothy J Gollan, MD  omega-3 acid ethyl esters (LOVAZA) 1 g capsule Take 1 capsule by mouth 2 (two) times daily.   Yes Historical Provider, MD  pantoprazole (PROTONIX) 40 MG tablet Take 1 tablet (40 mg total) by mouth daily. 05/24/16 05/24/17 Yes Darren Servando SnareWohl, MD  PARoxetine (PAXIL) 30 MG tablet Take 30 mg by mouth daily.   Yes Historical Provider, MD  potassium chloride (KLOR-CON) 20 MEQ packet Take 20 mEq by mouth 2 (two) times daily.   Yes Historical Provider, MD  potassium chloride SA (KLOR-CON M20) 20 MEQ tablet Take 1 tablet (20 mEq total) by mouth daily. 05/28/16 08/26/16 Yes Antonieta Ibaimothy J Gollan, MD  prazosin (MINIPRESS) 1 MG capsule Take 1 mg by mouth at bedtime.   Yes Historical Provider, MD  QUEtiapine (SEROQUEL) 300 MG tablet Take 300 mg by mouth at bedtime.   Yes Historical Provider, MD  spironolactone (ALDACTONE) 25 MG tablet Take 1 tablet (25 mg total) by mouth daily. 05/24/16  Yes Darren Servando SnareWohl, MD  ondansetron (ZOFRAN) 4 MG tablet Take 1 tablet (4 mg total) by mouth daily as needed for nausea or vomiting. 04/30/16   Emily FilbertJonathan E Williams, MD    Inpatient Medications:  . furosemide  40 mg Intravenous BID  . spironolactone  25 mg Oral Daily      Allergies: No Known Allergies  Social History   Social History  . Marital status: Single    Spouse name: N/A  . Number of children: N/A  . Years of education: N/A   Occupational History  . Not on file.   Social History Main Topics  . Smoking status: Former Games developermoker  . Smokeless tobacco: Never Used  . Alcohol use No  . Drug use:     Types: Marijuana     Comment: last smoked marijuana earlier today  . Sexual activity: Not on file   Other Topics  Concern  . Not on file   Social History Narrative  . No narrative on file     Family History  Problem Relation Age of Onset  . Hypertension Mother   . Hyperlipidemia Mother      Review of Systems: Review of Systems  Constitutional: Positive for malaise/fatigue. Negative for chills, diaphoresis, fever and weight loss.  HENT: Negative for congestion.   Eyes: Negative for discharge and redness.  Respiratory: Positive for cough and shortness of breath. Negative for hemoptysis, sputum production and wheezing.   Cardiovascular: Positive for chest pain, orthopnea and leg swelling. Negative for palpitations, claudication and PND.  Gastrointestinal: Positive for abdominal pain. Negative for blood in stool, heartburn, melena, nausea and vomiting.  Genitourinary: Negative for hematuria.  Musculoskeletal: Negative for falls and myalgias.  Skin: Negative for rash.  Neurological: Positive for weakness. Negative for dizziness, tingling, tremors, sensory change, speech change, focal weakness and loss of  consciousness.  Endo/Heme/Allergies: Does not bruise/bleed easily.  Psychiatric/Behavioral: Negative for substance abuse. The patient is not nervous/anxious.   All other systems reviewed and are negative.   Labs:  Recent Labs  05/29/16 2354 05/30/16 0335 05/30/16 0731  TROPONINI 0.09* 0.08* 0.09*   Lab Results  Component Value Date   WBC 6.5 05/29/2016   HGB 12.0 05/29/2016   HCT 37.6 05/29/2016   MCV 92.0 05/29/2016   PLT 218 05/29/2016     Recent Labs Lab 05/29/16 2354  NA 141  K 2.8*  CL 104  CO2 26  BUN 16  CREATININE 1.00  CALCIUM 9.0  PROT 7.8  BILITOT 4.6*  ALKPHOS 79  ALT 82*  AST 28  GLUCOSE 150*   No results found for: CHOL, HDL, LDLCALC, TRIG No results found for: DDIMER  Radiology/Studies:  Dg Chest 2 View  Result Date: 05/30/2016 CLINICAL DATA:  Acute onset of generalized abdominal pain and distention. Vomiting and diarrhea. Dark red blood noted in  stool. Initial encounter. EXAM: CHEST  2 VIEW COMPARISON:  Chest radiograph performed 05/19/2016 FINDINGS: The lungs are well-aerated. Vascular congestion is noted. Increased interstitial markings raise concern for mild interstitial edema. There is no evidence of pleural effusion or pneumothorax. The heart is mildly enlarged. An AICD is noted at the left chest wall, with a single lead ending at the right ventricle. No acute osseous abnormalities are seen. IMPRESSION: Vascular congestion and mild cardiomegaly. Increased interstitial markings raise concern for mild interstitial edema. Electronically Signed   By: Roanna Raider M.D.   On: 05/30/2016 00:24   Dg Chest 2 View  Result Date: 05/19/2016 CLINICAL DATA:  Generalized body aches and chest pain. EXAM: CHEST  2 VIEW COMPARISON:  April 30, 2016 FINDINGS: Stable cardiomegaly and left AICD device. Mild atelectasis in the left mid lung. No other interval changes or acute abnormalities. IMPRESSION: No active cardiopulmonary disease. Electronically Signed   By: Gerome Sam III M.D   On: 05/19/2016 23:47   Dg Chest 2 View  Result Date: 04/30/2016 CLINICAL DATA:  Chest pain EXAM: CHEST  2 VIEW COMPARISON:  04/22/2016 chest radiograph. FINDINGS: Stable configuration of single lead left subclavian ICD. Stable cardiomediastinal silhouette with mild cardiomegaly. No pneumothorax. No pleural effusion. No overt pulmonary edema. No acute consolidative airspace disease. Moderate thoracic spondylosis. IMPRESSION: 1. Stable mild cardiomegaly without overt pulmonary edema. 2. No acute pulmonary disease. Electronically Signed   By: Delbert Phenix M.D.   On: 04/30/2016 12:59   Ct Abdomen Pelvis W Contrast  Result Date: 05/30/2016 CLINICAL DATA:  50 year old female with generalized abdominal pain, vomiting, and diarrhea. Jaundice. EXAM: CT ABDOMEN AND PELVIS WITH CONTRAST TECHNIQUE: Multidetector CT imaging of the abdomen and pelvis was performed using the standard  protocol following bolus administration of intravenous contrast. CONTRAST:  ISOVUE-300 IOPAMIDOL (ISOVUE-300) INJECTION 61% COMPARISON:  Abdominal CT dated 05/14/2016 FINDINGS: Lower chest: Small right pleural effusion. Left lung base linear atelectasis/scarring. The visualized lung bases are otherwise clear. There is cardiomegaly with biatrial enlargement. A pacemaker wires partially visualized. There is retrograde flow of contrast from the right atrium into the IVC compatible with a degree of right cardiac dysfunction. Correlation with echocardiogram recommended. No intra-abdominal free air.  Small ascites. Hepatobiliary: Heterogeneous enhancement of the liver with nutmeg appearance most compatible with congestive hepatopathy. There is mild irregularity of the hepatic contour which may be related to underlying cirrhosis. Correlation with clinical exam recommended. No intrahepatic biliary ductal dilatation. No gallstone. Pancreas: Unremarkable. No pancreatic  ductal dilatation or surrounding inflammatory changes. Spleen: Normal in size without focal abnormality. Adrenals/Urinary Tract: Adrenal glands are unremarkable. Kidneys are normal, without renal calculi, focal lesion, or hydronephrosis. Bladder is unremarkable. Stomach/Bowel: Oral contrast noted within the stomach and opacifying multiple loops of small bowel and traversing into the colon without evidence of bowel obstruction. Mildly thickened loops of small bowel may be related to underdistention or hepatic enteropathy. Enteritis is less likely but not entirely excluded. Clinical correlation is recommended. Normal appendix. Vascular/Lymphatic: The abdominal aorta appears unremarkable. The origins of the celiac axis, SMA, IMA as well as the origins of the renal arteries are patent. No portal venous gas identified. There is no adenopathy. Reproductive: Hysterectomy. Other: There is diffuse subcutaneous edema and anasarca. Musculoskeletal: Multilevel  degenerative changes of the spine most prominent at L4-L5 where there is endplate irregularity and sclerosis. No acute fracture. IMPRESSION: Cardiomegaly with evidence of right cardiac dysfunction. Correlation with echocardiogram recommended. Congestive hepatopathy with possible mild underlying cirrhosis. Correlation with clinical exam and liver function tests recommended. Ultrasound may provide better evaluation of the liver contour. Small right pleural effusion, small ascites, and anasarca. Segmental thickening of the small bowel in the mid abdomen may be related to underdistention or hepatic enteropathy. Enteritis is not excluded. Clinical correlation is recommended. No bowel obstruction. Normal appendix. Electronically Signed   By: Elgie Collard M.D.   On: 05/30/2016 06:06   Ct Abdomen Pelvis W Contrast  Result Date: 05/14/2016 CLINICAL DATA:  Nausea,  vomiting and diarrhea for 1 month. EXAM: CT ABDOMEN AND PELVIS WITH CONTRAST TECHNIQUE: Multidetector CT imaging of the abdomen and pelvis was performed using the standard protocol following bolus administration of intravenous contrast. CONTRAST:  ISOVUE-300 IOPAMIDOL (ISOVUE-300) INJECTION 61% COMPARISON:  None. FINDINGS: Lower chest: The lung bases are clear. Hepatobiliary: Hepatic steatosis noted. There is reflux of contrast material from the right atrium into the hepatic veins compatible with passive venous congestion. No focal liver abnormalities. Diffuse gallbladder wall edema is identified. No biliary dilatation noted. Pancreas: Unremarkable. No pancreatic ductal dilatation or surrounding inflammatory changes. Spleen: Normal in size without focal abnormality. Adrenals/Urinary Tract: Adrenal glands are unremarkable. Kidneys are normal, without renal calculi, focal lesion, or hydronephrosis. Bladder is unremarkable. Stomach/Bowel: Stomach is within normal limits. Appendix appears normal. No evidence of bowel wall thickening, distention, or  inflammatory changes. Vascular/Lymphatic: Calcified atherosclerotic disease involves the abdominal aorta. No aneurysm. No enlarged retroperitoneal or mesenteric adenopathy. No enlarged pelvic or inguinal lymph nodes. Reproductive: Status post hysterectomy. No adnexal masses. Other: There is moderate ascites within the abdomen and pelvis. Diffuse body wall edema is identified. Musculoskeletal: Degenerative disc disease is identified at the L4-5 level. IMPRESSION: 1. Moderate abdominal ascites and body wall edema. 2. Reflux of contrast material into the IVC and hepatic veins compatible with passive venous congestion which may be seen with right heart failure. 3. Gallbladder wall edema is nonspecific in the setting of ascites and anasarca. 4. Aortic atherosclerosis 5. Lumbar degenerative disc disease. Electronically Signed   By: Signa Kell M.D.   On: 05/14/2016 10:59   US Abdomen Limited Ruq  Result Date: 04/30/2016 CLINICAL DATA:  Right upper quadrant pain for several weeks EXAM: US ABDOMEN LIMITED - RIGHT UPPER QUADRANT COMPARISON:  None. FINDINGS: Gallbladder: Gallbladder is well distended. Wall thickening to 3.6 mm is noted with a small amount of pericholecystic fluid. No gallstones are noted. Positive sonographic Eulah Pont sign is noted. Common bile duct: Diameter: 4.7 mm. Liver: No focal lesion identified. Within normal  limits in parenchymal echogenicity. Some mild ascites is noted surrounding the spleen and liver. IMPRESSION: Gallbladder wall thickening which may be related to mild ascites. There is a positive sonographic Murphy sign which may suggest acalculous cholecystitis. HIDA scan may be helpful if indicated. Electronically Signed   By: Alcide Clever M.D.   On: 04/30/2016 16:05    EKG: Interpreted by me showed: NSR, 73 bpm, mildly prolonged QTc at 475 msec, nonspecific inferolateral st/t changes, TWI V6 Telemetry: Interpreted by me showed: NSR, 70's bpm  Weights: Filed Weights   05/29/16 2327    Weight: 171 lb (77.6 kg)     Physical Exam: Blood pressure (!) 155/116, pulse 65, temperature 97.9 F (36.6 C), temperature source Oral, resp. rate 18, height 5\' 5"  (1.651 m), weight 171 lb (77.6 kg), SpO2 100 %. Body mass index is 28.46 kg/m. General: Well developed, well nourished, in no acute distress. Head: Normocephalic, atraumatic, sclera non-icteric, no xanthomas, nares are without discharge.  Neck: Negative for carotid bruits. JVD elevated to the jaw. Lungs: Crackles along the bilateral bases. Breathing is unlabored. Heart: RRR with S1 S2. No murmurs, rubs, or gallops appreciated. Abdomen: Soft, tender, distended with normoactive bowel sounds. No hepatomegaly. No rebound/guarding. No obvious abdominal masses. Msk:  Strength and tone appear normal for age. Extremities: No clubbing or cyanosis. Trace pre-tibial edema. Distal pedal pulses are 2+ and equal bilaterally. Neuro: Alert and oriented X 3. No facial asymmetry. No focal deficit. Moves all extremities spontaneously. Psych:  Responds to questions appropriately with a normal affect.    Assessment and Plan:  Principal Problem:   Acute on chronic systolic CHF (congestive heart failure) (HCC) Active Problems:   Pulmonary hypertension   Hypokalemia   NICM (nonischemic cardiomyopathy) (HCC)    1. Acute on chronic systolic CHF/pulmonary hypertension/NICM s/p SJM ICD: -She is significantly volume overloaded -Agree with IV diuresis at 40 mg bid with KCl repletion  -Increase spironolactone to 25 mg daily -Decrease Toprol XL to 12.5 mg daily given possible decompensation  -CHF education needed, suggest nutrition consult -Status post diuresis she will need a RHC -Will need follow up with the advanced heart failure team in [place] -Should she demonstrate medication compliance and tolerate optimal medical therapy, down the road she could discuss more invasive heart failure therapies with the advanced heart failure team. At this time  she is not a candidate for these therapies given non-compliance and not on optimal medical therapy   2. Hypokalemia: -Replete to 4.0  3. Ascites/anasarca/congestive hepatopathy/possible mild cirrhosis: -Previous heavy alcohol abuse -As above -Has previously required paracentesis in 2016 -Imaging shows small ascites, this is less likely to be a SBP issue, though cannot rule this out -If indicated consider diagnostic paracentesis to rule out SBP  4. Elevated troponin: -Mildly elevated and flat trending likely in the setting of supply demand ischemia 2/2 volume overload and low output -She reports a recent nonobstructive cardiac cath in [place], [place] in January 2016. Will attempt to obtain these records -Recent echo as above -If unable to obtain the records from [place] she may require outpatient ischemic evaluation along with a RHC s/p diuresis    Signed, Eula Listen, PA-C [facility] Pager: [phone] 05/30/2016, 11:03 AM

## 2016-05-30 NOTE — ED Notes (Signed)
Pt's O2 sats noted to fluctuate between 80-94% depending on pt's LOC. Pt reports h/x of sleep apnea; pt placed on 2L O2 via [place] ATT to help pt maintain O2 sats above 94%.

## 2016-05-31 LAB — BASIC METABOLIC PANEL
Anion gap: 8 (ref 5–15)
BUN: 20 mg/dL (ref 6–20)
CALCIUM: 8.6 mg/dL — AB (ref 8.9–10.3)
CO2: 27 mmol/L (ref 22–32)
Chloride: 103 mmol/L (ref 101–111)
Creatinine, Ser: 1.18 mg/dL — ABNORMAL HIGH (ref 0.44–1.00)
GFR calc Af Amer: >60 mL/min (ref >60)
GFR, EST NON AFRICAN AMERICAN: 52 mL/min — AB (ref >60)
GLUCOSE: 121 mg/dL — AB (ref 65–99)
POTASSIUM: 4.3 mmol/L (ref 3.5–5.1)
Sodium: 138 mmol/L (ref 135–145)

## 2016-05-31 LAB — HEPATITIS PANEL, ACUTE
HCV Ab: <0.1 s/co ratio (ref 0.0–0.9)
HEP A IGM: NEGATIVE
HEP B C IGM: NEGATIVE
Hepatitis B Surface Ag: NEGATIVE

## 2016-05-31 LAB — MAGNESIUM: Magnesium: 1.9 mg/dL (ref 1.7–2.4)

## 2016-05-31 MED ORDER — MAGNESIUM SULFATE IN D5W 1-5 GM/100ML-% IV SOLN
1.0000 g | Freq: Once | INTRAVENOUS | Status: AC
  Administered 2016-05-31: 1 g via INTRAVENOUS
  Filled 2016-05-31: qty 100

## 2016-05-31 MED ORDER — POTASSIUM CHLORIDE CRYS ER 20 MEQ PO TBCR
20.0000 meq | EXTENDED_RELEASE_TABLET | Freq: Once | ORAL | Status: AC
  Administered 2016-05-31: 20 meq via ORAL
  Filled 2016-05-31: qty 1

## 2016-05-31 NOTE — Progress Notes (Signed)
Abdominal pain persists.  Only required pain medication one time.  Little activity in the room.  Up to BR only.  IV lasix continues.

## 2016-05-31 NOTE — Progress Notes (Signed)
Patient: Bonnie Ochoa / Admit Date: 05/30/2016 / Date of Encounter: 05/31/2016, 9:43 AM   Subjective: No acute overnight events. She reports good UOP. One void was lost in the toilet prior to being counted. It appears she has voided from slightly over 1 L to 1.5 L for the past 24 hours. Renal function stable. Breathing is slightly better. Continues to be on IV Lasix 40 mg bid with KCl repletion.   Review of Systems: Review of Systems  Constitutional: Positive for malaise/fatigue. Negative for chills, diaphoresis, fever and weight loss.  HENT: Negative for congestion.   Eyes: Negative for discharge and redness.  Respiratory: Positive for shortness of breath. Negative for cough, sputum production and wheezing.   Cardiovascular: Positive for orthopnea and leg swelling. Negative for chest pain, palpitations, claudication and PND.  Gastrointestinal: Negative for abdominal pain, heartburn, nausea and vomiting.  Genitourinary: Positive for frequency.  Musculoskeletal: Negative for falls and myalgias.  Skin: Negative for rash.  Neurological: Positive for weakness. Negative for dizziness, tingling, tremors, sensory change, speech change, focal weakness and loss of consciousness.  Endo/Heme/Allergies: Does not bruise/bleed easily.  Psychiatric/Behavioral: Negative for substance abuse. The patient is not nervous/anxious.   All other systems reviewed and are negative.   Objective: Telemetry: NSR 70's bpm Physical Exam: Blood pressure (!) 132/96, pulse 73, temperature 98 F (36.7 C), temperature source Oral, resp. rate 18, height 5\' 5"  (1.651 m), weight 171 lb (77.6 kg), SpO2 (!) 88 %. Body mass index is 28.46 kg/m. General: Well developed, well nourished, in no acute distress. Head: Normocephalic, atraumatic, sclera non-icteric, no xanthomas, nares are without discharge. Neck: Negative for carotid bruits. JVP elevated to the jaw. Lungs: Crackles along bilateral bases. Breathing is  unlabored. Heart: RRR S1 S2 without murmurs, rubs, or gallops.  Abdomen: Soft, non-tender, non-distended with normoactive bowel sounds. No rebound/guarding. Extremities: No clubbing or cyanosis. Trace pre-tibial edema. Distal pedal pulses are 2+ and equal bilaterally. Neuro: Alert and oriented X 3. Moves all extremities spontaneously. Psych:  Responds to questions appropriately with a normal affect.   Intake/Output Summary (Last 24 hours) at 05/31/16 0943 Last data filed at 05/31/16 0644  Gross per 24 hour  Intake               50 ml  Output              500 ml  Net             -450 ml    Inpatient Medications:  . aspirin EC  81 mg Oral Daily  . atorvastatin  40 mg Oral q1800  . carvedilol  3.125 mg Oral BID WC  . docusate sodium  100 mg Oral BID  . enoxaparin (LOVENOX) injection  40 mg Subcutaneous Q24H  . furosemide  40 mg Intravenous BID  . Influenza vac split quadrivalent PF  0.5 mL Intramuscular Tomorrow-1000  . lisinopril  20 mg Oral Daily  . omega-3 acid ethyl esters  1 capsule Oral BID  . pantoprazole  40 mg Oral Daily  . PARoxetine  30 mg Oral Daily  . prazosin  1 mg Oral QHS  . QUEtiapine  300 mg Oral QHS  . sodium chloride flush  3 mL Intravenous Q12H  . sodium chloride flush  3 mL Intravenous Q12H  . spironolactone  25 mg Oral Daily   Infusions:    Labs:  Recent Labs  05/30/16 0731 05/30/16 0737 05/31/16 0547  NA  --  138 138  K  --  3.8 4.3  CL  --  103 103  CO2  --  25 27  GLUCOSE  --  134* 121*  BUN  --  17 20  CREATININE  --  1.14* 1.18*  CALCIUM  --  8.5* 8.6*  MG 1.8  --  1.9    Recent Labs  05/29/16 2354  AST 28  ALT 82*  ALKPHOS 79  BILITOT 4.6*  PROT 7.8  ALBUMIN 3.8    Recent Labs  05/29/16 2354  WBC 6.5  HGB 12.0  HCT 37.6  MCV 92.0  PLT 218    Recent Labs  05/29/16 2354 05/30/16 0335 05/30/16 0731  TROPONINI 0.09* 0.08* 0.09*   Invalid input(s): POCBNP No results for input(s): HGBA1C in the last 72 hours.    Weights: Filed Weights   05/29/16 2327  Weight: 171 lb (77.6 kg)     Radiology/Studies:  Dg Chest 2 View  Result Date: 05/30/2016 CLINICAL DATA:  Acute onset of generalized abdominal pain and distention. Vomiting and diarrhea. Dark red blood noted in stool. Initial encounter. EXAM: CHEST  2 VIEW COMPARISON:  Chest radiograph performed 05/19/2016 FINDINGS: The lungs are well-aerated. Vascular congestion is noted. Increased interstitial markings raise concern for mild interstitial edema. There is no evidence of pleural effusion or pneumothorax. The heart is mildly enlarged. An AICD is noted at the left chest wall, with a single lead ending at the right ventricle. No acute osseous abnormalities are seen. IMPRESSION: Vascular congestion and mild cardiomegaly. Increased interstitial markings raise concern for mild interstitial edema. Electronically Signed   By: Roanna Raider M.D.   On: 05/30/2016 00:24   Dg Chest 2 View  Result Date: 05/19/2016 CLINICAL DATA:  Generalized body aches and chest pain. EXAM: CHEST  2 VIEW COMPARISON:  April 30, 2016 FINDINGS: Stable cardiomegaly and left AICD device. Mild atelectasis in the left mid lung. No other interval changes or acute abnormalities. IMPRESSION: No active cardiopulmonary disease. Electronically Signed   By: Gerome Sam III M.D   On: 05/19/2016 23:47   Ct Abdomen Pelvis W Contrast  Result Date: 05/30/2016 CLINICAL DATA:  50 year old female with generalized abdominal pain, vomiting, and diarrhea. Jaundice. EXAM: CT ABDOMEN AND PELVIS WITH CONTRAST TECHNIQUE: Multidetector CT imaging of the abdomen and pelvis was performed using the standard protocol following bolus administration of intravenous contrast. CONTRAST:  ISOVUE-300 IOPAMIDOL (ISOVUE-300) INJECTION 61% COMPARISON:  Abdominal CT dated 05/14/2016 FINDINGS: Lower chest: Small right pleural effusion. Left lung base linear atelectasis/scarring. The visualized lung bases are  otherwise clear. There is cardiomegaly with biatrial enlargement. A pacemaker wires partially visualized. There is retrograde flow of contrast from the right atrium into the IVC compatible with a degree of right cardiac dysfunction. Correlation with echocardiogram recommended. No intra-abdominal free air.  Small ascites. Hepatobiliary: Heterogeneous enhancement of the liver with nutmeg appearance most compatible with congestive hepatopathy. There is mild irregularity of the hepatic contour which may be related to underlying cirrhosis. Correlation with clinical exam recommended. No intrahepatic biliary ductal dilatation. No gallstone. Pancreas: Unremarkable. No pancreatic ductal dilatation or surrounding inflammatory changes. Spleen: Normal in size without focal abnormality. Adrenals/Urinary Tract: Adrenal glands are unremarkable. Kidneys are normal, without renal calculi, focal lesion, or hydronephrosis. Bladder is unremarkable. Stomach/Bowel: Oral contrast noted within the stomach and opacifying multiple loops of small bowel and traversing into the colon without evidence of bowel obstruction. Mildly thickened loops of small bowel may be related to underdistention or hepatic enteropathy. Enteritis is  less likely but not entirely excluded. Clinical correlation is recommended. Normal appendix. Vascular/Lymphatic: The abdominal aorta appears unremarkable. The origins of the celiac axis, SMA, IMA as well as the origins of the renal arteries are patent. No portal venous gas identified. There is no adenopathy. Reproductive: Hysterectomy. Other: There is diffuse subcutaneous edema and anasarca. Musculoskeletal: Multilevel degenerative changes of the spine most prominent at L4-L5 where there is endplate irregularity and sclerosis. No acute fracture. IMPRESSION: Cardiomegaly with evidence of right cardiac dysfunction. Correlation with echocardiogram recommended. Congestive hepatopathy with possible mild underlying cirrhosis.  Correlation with clinical exam and liver function tests recommended. Ultrasound may provide better evaluation of the liver contour. Small right pleural effusion, small ascites, and anasarca. Segmental thickening of the small bowel in the mid abdomen may be related to underdistention or hepatic enteropathy. Enteritis is not excluded. Clinical correlation is recommended. No bowel obstruction. Normal appendix. Electronically Signed   By: Elgie Collard M.D.   On: 05/30/2016 06:06   Ct Abdomen Pelvis W Contrast  Result Date: 05/14/2016 CLINICAL DATA:  Nausea,  vomiting and diarrhea for 1 month. EXAM: CT ABDOMEN AND PELVIS WITH CONTRAST TECHNIQUE: Multidetector CT imaging of the abdomen and pelvis was performed using the standard protocol following bolus administration of intravenous contrast. CONTRAST:  ISOVUE-300 IOPAMIDOL (ISOVUE-300) INJECTION 61% COMPARISON:  None. FINDINGS: Lower chest: The lung bases are clear. Hepatobiliary: Hepatic steatosis noted. There is reflux of contrast material from the right atrium into the hepatic veins compatible with passive venous congestion. No focal liver abnormalities. Diffuse gallbladder wall edema is identified. No biliary dilatation noted. Pancreas: Unremarkable. No pancreatic ductal dilatation or surrounding inflammatory changes. Spleen: Normal in size without focal abnormality. Adrenals/Urinary Tract: Adrenal glands are unremarkable. Kidneys are normal, without renal calculi, focal lesion, or hydronephrosis. Bladder is unremarkable. Stomach/Bowel: Stomach is within normal limits. Appendix appears normal. No evidence of bowel wall thickening, distention, or inflammatory changes. Vascular/Lymphatic: Calcified atherosclerotic disease involves the abdominal aorta. No aneurysm. No enlarged retroperitoneal or mesenteric adenopathy. No enlarged pelvic or inguinal lymph nodes. Reproductive: Status post hysterectomy. No adnexal masses. Other: There is moderate ascites  within the abdomen and pelvis. Diffuse body wall edema is identified. Musculoskeletal: Degenerative disc disease is identified at the L4-5 level. IMPRESSION: 1. Moderate abdominal ascites and body wall edema. 2. Reflux of contrast material into the IVC and hepatic veins compatible with passive venous congestion which may be seen with right heart failure. 3. Gallbladder wall edema is nonspecific in the setting of ascites and anasarca. 4. Aortic atherosclerosis 5. Lumbar degenerative disc disease. Electronically Signed   By: Signa Kell M.D.   On: 05/14/2016 10:59     Assessment and Plan  Principal Problem:   Acute on chronic systolic CHF (congestive heart failure) (HCC) Active Problems:   Pulmonary hypertension   Hypokalemia   NICM (nonischemic cardiomyopathy) (HCC)   Elevated troponin   Other ascites    1. Acute on chronic systolic CHF/pulmonary hypertension/NICM s/p SJM ICD: -She remains significantly volume overloaded -Continue with IV diuresis with Lasix 40 mg bid with KCl repletion  -Continue spironolactone to 25 mg daily -Continue low-dose Coreg 3.125 mg bid given possible decompensation  -Titrate lisinopril to 40 mg daily -Consider adding hydralazine/Imdur on 10/20 -If UOP drops off she may need milrinone infusion  -CHF education needed, suggest nutrition consult -Status post diuresis she will need a RHC -Will need follow up with the advanced heart failure team in [place] -Should she demonstrate medication compliance and tolerate optimal  medical therapy, down the road she could discuss more invasive heart failure therapies with the advanced heart failure team. At this time she is not a candidate for these therapies given non-compliance and not on optimal medical therapy   2. Hypokalemia: -Improved  3. Ascites/anasarca/congestive hepatopathy/possible mild cirrhosis: -Previous heavy alcohol abuse -As above -Has previously required paracentesis in 2016 -Imaging shows small  ascites, this is less likely to be a SBP issue, though cannot rule this out -If indicated consider diagnostic paracentesis to rule out SBP  4. Elevated troponin: -Mildly elevated and flat trending likely in the setting of supply demand ischemia 2/2 volume overload and low output -She reports a recent nonobstructive cardiac cath in [place], [place] in January 2016. Will attempt to obtain these records -Recent echo as above -If unable to obtain the records from [place] she may require outpatient ischemic evaluation along with a RHC s/p diuresis   Signed, Eula Listen, PA-C [facility] Pager: [phone] 05/31/2016, 9:43 AM

## 2016-05-31 NOTE — Progress Notes (Signed)
[facility] - [place] at [facility]   PATIENT NAME: Bonnie Ochoa    MR#:  [id number]  DATE OF BIRTH:  04-22-1966  SUBJECTIVE: Shortness of breath, admitted for acute on chronic systolic heart failure. Started on IV Lasix. Patient says that she feels slightly better today. Decreased  leg edema.   CHIEF COMPLAINT:   Chief Complaint  Patient presents with  . Abdominal Pain  . Emesis  . Chest Pain    REVIEW OF SYSTEMS:   ROS CONSTITUTIONAL: No fever, fatigue or weakness.  EYES: No blurred or double vision.  EARS, NOSE, AND THROAT: No tinnitus or ear pain.  RESPIRATORY: No cough, shortness of breath, wheezing or hemoptysis.  CARDIOVASCULAR: No chest pain, orthopnea, edema.  GASTROINTESTINAL: No nausea, vomiting, diarrhea or abdominal pain.  GENITOURINARY: No dysuria, hematuria.  ENDOCRINE: No polyuria, nocturia,  HEMATOLOGY: No anemia, easy bruising or bleeding SKIN: No rash or lesion. MUSCULOSKELETAL:trace pedal edema NEUROLOGIC: No tingling, numbness, weakness.  PSYCHIATRY: No anxiety or depression.   DRUG ALLERGIES:  No Known Allergies  VITALS:  Blood pressure 103/67, pulse 68, temperature 98.4 F (36.9 C), temperature source Oral, resp. rate 18, height 5\' 5"  (1.651 m), weight 77.6 kg (171 lb), SpO2 96 %.  PHYSICAL EXAMINATION:  GENERAL:  50 y.o.-year-old patient lying in the bed with no acute distress.  EYES: Pupils equal, round, reactive to light and accommodation. No scleral icterus. Extraocular muscles intact.  HEENT: Head atraumatic, normocephalic. Oropharynx and nasopharynx clear.  NECK:  Supple, no jugular venous distention. No thyroid enlargement, no tenderness.  LUNGS: Normal breath sounds bilaterally, Patient has faint bibasilar crackles worse on the left side. CARDIOVASCULAR: S1, S2 normal. No murmurs, rubs, or gallops.  ABDOMEN: Soft, nontender, nondistended. Bowel sounds present. No organomegaly or mass.  EXTREMITIES: No pedal  edema, cyanosis, or clubbing.  NEUROLOGIC: Cranial nerves II through XII are intact. Muscle strength 5/5 in all extremities. Sensation intact. Gait not checked.  PSYCHIATRIC: The patient is alert and oriented x 3.  SKIN: No obvious rash, lesion, or ulcer.    LABORATORY PANEL:   CBC  Recent Labs Lab 05/29/16 2354  WBC 6.5  HGB 12.0  HCT 37.6  PLT 218   ------------------------------------------------------------------------------------------------------------------  Chemistries   Recent Labs Lab 05/29/16 2354  05/31/16 0547  NA 141  < > 138  K 2.8*  < > 4.3  CL 104  < > 103  CO2 26  < > 27  GLUCOSE 150*  < > 121*  BUN 16  < > 20  CREATININE 1.00  < > 1.18*  CALCIUM 9.0  < > 8.6*  MG  --   < > 1.9  AST 28  --   --   ALT 82*  --   --   ALKPHOS 79  --   --   BILITOT 4.6*  --   --   < > = values in this interval not displayed. ------------------------------------------------------------------------------------------------------------------  Cardiac Enzymes  Recent Labs Lab 05/30/16 0731  TROPONINI 0.09*   ------------------------------------------------------------------------------------------------------------------  RADIOLOGY:  Dg Chest 2 View  Result Date: 05/30/2016 CLINICAL DATA:  Acute onset of generalized abdominal pain and distention. Vomiting and diarrhea. Dark red blood noted in stool. Initial encounter. EXAM: CHEST  2 VIEW COMPARISON:  Chest radiograph performed 05/19/2016 FINDINGS: The lungs are well-aerated. Vascular congestion is noted. Increased interstitial markings raise concern for mild interstitial edema. There is no evidence of pleural effusion or pneumothorax. The heart is mildly enlarged. An AICD is  noted at the left chest wall, with a single lead ending at the right ventricle. No acute osseous abnormalities are seen. IMPRESSION: Vascular congestion and mild cardiomegaly. Increased interstitial markings raise concern for mild interstitial  edema. Electronically Signed   By: Roanna RaiderJeffery  Chang M.D.   On: 05/30/2016 00:24   Ct Abdomen Pelvis W Contrast  Result Date: 05/30/2016 CLINICAL DATA:  50 year old female with generalized abdominal pain, vomiting, and diarrhea. Jaundice. EXAM: CT ABDOMEN AND PELVIS WITH CONTRAST TECHNIQUE: Multidetector CT imaging of the abdomen and pelvis was performed using the standard protocol following bolus administration of intravenous contrast. CONTRAST:  100mL ISOVUE-300 IOPAMIDOL (ISOVUE-300) INJECTION 61% COMPARISON:  Abdominal CT dated 05/14/2016 FINDINGS: Lower chest: Small right pleural effusion. Left lung base linear atelectasis/scarring. The visualized lung bases are otherwise clear. There is cardiomegaly with biatrial enlargement. A pacemaker wires partially visualized. There is retrograde flow of contrast from the right atrium into the IVC compatible with a degree of right cardiac dysfunction. Correlation with echocardiogram recommended. No intra-abdominal free air.  Small ascites. Hepatobiliary: Heterogeneous enhancement of the liver with nutmeg appearance most compatible with congestive hepatopathy. There is mild irregularity of the hepatic contour which may be related to underlying cirrhosis. Correlation with clinical exam recommended. No intrahepatic biliary ductal dilatation. No gallstone. Pancreas: Unremarkable. No pancreatic ductal dilatation or surrounding inflammatory changes. Spleen: Normal in size without focal abnormality. Adrenals/Urinary Tract: Adrenal glands are unremarkable. Kidneys are normal, without renal calculi, focal lesion, or hydronephrosis. Bladder is unremarkable. Stomach/Bowel: Oral contrast noted within the stomach and opacifying multiple loops of small bowel and traversing into the colon without evidence of bowel obstruction. Mildly thickened loops of small bowel may be related to underdistention or hepatic enteropathy. Enteritis is less likely but not entirely excluded. Clinical  correlation is recommended. Normal appendix. Vascular/Lymphatic: The abdominal aorta appears unremarkable. The origins of the celiac axis, SMA, IMA as well as the origins of the renal arteries are patent. No portal venous gas identified. There is no adenopathy. Reproductive: Hysterectomy. Other: There is diffuse subcutaneous edema and anasarca. Musculoskeletal: Multilevel degenerative changes of the spine most prominent at L4-L5 where there is endplate irregularity and sclerosis. No acute fracture. IMPRESSION: Cardiomegaly with evidence of right cardiac dysfunction. Correlation with echocardiogram recommended. Congestive hepatopathy with possible mild underlying cirrhosis. Correlation with clinical exam and liver function tests recommended. Ultrasound may provide better evaluation of the liver contour. Small right pleural effusion, small ascites, and anasarca. Segmental thickening of the small bowel in the mid abdomen may be related to underdistention or hepatic enteropathy. Enteritis is not excluded. Clinical correlation is recommended. No bowel obstruction. Normal appendix. Electronically Signed   By: Elgie CollardArash  Radparvar M.D.   On: 05/30/2016 06:06    EKG:   Orders placed or performed during the hospital encounter of 05/30/16  . EKG 12-Lead  . EKG 12-Lead  . EKG - 12 lead  . EKG - 12 lead    ASSESSMENT AND PLAN:   Acute on chronic systolic heart failure: Noncompliance with medication. Continue IV Lasix, Coreg, lisinopril, Aldactone, follow-up with CHF clinic. Continue IV Lasix at the same dose for today and see how she does. She is clinically improving but not back to baseline yet. #2. Hypoxia on admission because of CHF exacerbation: Improved right now, room air sats 96%. When she came 88%. #3. Abdominal pain secondary to CHF. No abdominal pain and distention. No nausea. Tolerating the diet. #4 ascites and anasarca secondary to congestive heart failure. 5/d/w family   All  the records are  reviewed and case discussed with Care Management/Social Workerr. Management plans discussed with the patient, family and they are in agreement.  CODE STATUS: full  TOTAL TIME TAKING CARE OF THIS PATIENT: 35 minutes.   POSSIBLE D/C IN 1-2 DAYS, DEPENDING ON CLINICAL CONDITION.   Katha Hamming M.D on 05/31/2016 at 12:51 PM  Between 7am to 6pm - Pager - [phone]  After 6pm go to [email or web address] - password [email or web address] [facility]  [facility]  Office  [phone]  CC: Primary care physician; No PCP Per Patient   Note: This dictation was prepared with Dragon dictation along with smaller phrase technology. Any transcriptional errors that result from this process are unintentional.

## 2016-05-31 NOTE — Progress Notes (Signed)
MEDICATION RELATED CONSULT NOTE - INITIAL   Pharmacy Consult for K supp, Mag check Indication: hypokalemia  No Known Allergies  Patient Measurements: Height: 5\' 5"  (165.1 cm) Weight: 171 lb (77.6 kg) IBW/kg (Calculated) : 57   Vital Signs: Temp: 98 F (36.7 C) (10/19 0752) Temp Source: Oral (10/19 0752) BP: 132/96 (10/19 0752) Pulse Rate: 73 (10/19 0757) Intake/Output from previous day: 10/18 0701 - 10/19 0700 In: 50 [IV Piggyback:50] Out: 500 [Urine:500] Intake/Output from this shift: Total I/O In: 240 [P.O.:240] Out: 800 [Urine:800]  Labs:  Recent Labs  05/29/16 2354 05/30/16 0731 05/30/16 0737 05/31/16 0547  WBC 6.5  --   --   --   HGB 12.0  --   --   --   HCT 37.6  --   --   --   PLT 218  --   --   --   CREATININE 1.00  --  1.14* 1.18*  MG  --  1.8  --  1.9  ALBUMIN 3.8  --   --   --   PROT 7.8  --   --   --   AST 28  --   --   --   ALT 82*  --   --   --   ALKPHOS 79  --   --   --   BILITOT 4.6*  --   --   --    Estimated Creatinine Clearance: 57.4 mL/min (by C-G formula based on SCr of 1.18 mg/dL (H)).     Assessment: 50 yo female with K of 2.8 on admission last night. Per consult replace K orally and also check Mag level. Pt has received KCl 40 mEq PO x2 doses so far.  Pt also ordered lasix 40 IV BID.  Goal of Therapy:  K ~4 per cards note Mag ~2  Plan:  Received KCl yesterday, 2gm IV mag. Potassium 4.3 today, Magnesium 1.9 Remains on lasix 40mg  IV BID. Will give potassium PO x 1 and magnesium 1gm IV x 1 today. Recheck with AM labs.  Lache Dagher C 05/31/2016,11:23 AM

## 2016-06-01 DIAGNOSIS — I272 Pulmonary hypertension, unspecified: Secondary | ICD-10-CM

## 2016-06-01 LAB — POTASSIUM: POTASSIUM: 3.2 mmol/L — AB (ref 3.5–5.1)

## 2016-06-01 LAB — MAGNESIUM: Magnesium: 1.8 mg/dL (ref 1.7–2.4)

## 2016-06-01 MED ORDER — OXYCODONE HCL 5 MG PO TABS
5.0000 mg | ORAL_TABLET | Freq: Once | ORAL | Status: AC
  Administered 2016-06-01: 5 mg via ORAL
  Filled 2016-06-01: qty 1

## 2016-06-01 MED ORDER — POTASSIUM CHLORIDE CRYS ER 20 MEQ PO TBCR
40.0000 meq | EXTENDED_RELEASE_TABLET | Freq: Once | ORAL | Status: AC
  Administered 2016-06-01: 40 meq via ORAL
  Filled 2016-06-01: qty 2

## 2016-06-01 MED ORDER — FUROSEMIDE 10 MG/ML IJ SOLN
40.0000 mg | Freq: Three times a day (TID) | INTRAMUSCULAR | Status: DC
  Administered 2016-06-01 – 2016-06-04 (×9): 40 mg via INTRAVENOUS
  Filled 2016-06-01 (×10): qty 4

## 2016-06-01 MED ORDER — POTASSIUM CHLORIDE CRYS ER 20 MEQ PO TBCR
40.0000 meq | EXTENDED_RELEASE_TABLET | Freq: Three times a day (TID) | ORAL | Status: DC
  Administered 2016-06-01 – 2016-06-03 (×8): 40 meq via ORAL
  Filled 2016-06-01 (×8): qty 2

## 2016-06-01 MED ORDER — MAGNESIUM SULFATE 4 GM/100ML IV SOLN
4.0000 g | Freq: Once | INTRAVENOUS | Status: AC
  Administered 2016-06-01: 4 g via INTRAVENOUS
  Filled 2016-06-01: qty 100

## 2016-06-01 NOTE — Progress Notes (Signed)
[facility] [place] [facility]   PATIENT NAME: Bonnie Ochoa    MR#:  [id number]  DATE OF BIRTH:  03-21-1966  SUBJECTIVE: Still short of breath. And hypoxic. No chest pain.Marland Kitchen.   CHIEF COMPLAINT:   Chief Complaint  Patient presents with  . Abdominal Pain  . Emesis  . Chest Pain    REVIEW OF SYSTEMS:   ROS CONSTITUTIONAL: No fever, fatigue or weakness.  EYES: No blurred or double vision.  EARS, NOSE, AND THROAT: No tinnitus or ear pain.  RESPIRATORY: No cough, shortness of breath, wheezing or hemoptysis.  CARDIOVASCULAR: No chest pain, orthopnea, edema.  GASTROINTESTINAL: No nausea, vomiting, diarrhea or abdominal pain.  GENITOURINARY: No dysuria, hematuria.  ENDOCRINE: No polyuria, nocturia,  HEMATOLOGY: No anemia, easy bruising or bleeding SKIN: No rash or lesion. MUSCULOSKELETAL:trace pedal edema NEUROLOGIC: No tingling, numbness, weakness.  PSYCHIATRY: No anxiety or depression.   DRUG ALLERGIES:  No Known Allergies  VITALS:  Blood pressure 131/87, pulse 71, temperature 98.3 F (36.8 C), resp. rate 18, height 5\' 5"  (1.651 m), weight 77.6 kg (171 lb), SpO2 (!) 84 %.  PHYSICAL EXAMINATION:  GENERAL:  50 y.o.-year-old patient lying in the bed with no acute distress.  EYES: Pupils equal, round, reactive to light and accommodation. No scleral icterus. Extraocular muscles intact.  HEENT: Head atraumatic, normocephalic. Oropharynx and nasopharynx clear.  NECK:  Supple, no jugular venous distention. No thyroid enlargement, no tenderness.  LUNGS: Normal breath sounds bilaterally, Patient has faint bibasilar crackles worse on the left side. CARDIOVASCULAR: S1, S2 normal. No murmurs, rubs, or gallops.  ABDOMEN: Soft, nontender, nondistended. Bowel sounds present. No organomegaly or mass.  EXTREMITIES: No pedal edema, cyanosis, or clubbing.  NEUROLOGIC: Cranial nerves II through XII are intact. Muscle strength 5/5 in all extremities. Sensation intact.  Gait not checked.  PSYCHIATRIC: The patient is alert and oriented x 3.  SKIN: No obvious rash, lesion, or ulcer.    LABORATORY PANEL:   CBC  Recent Labs Lab 05/29/16 2354  WBC 6.5  HGB 12.0  HCT 37.6  PLT 218   ------------------------------------------------------------------------------------------------------------------  Chemistries   Recent Labs Lab 05/29/16 2354  05/31/16 0547 06/01/16 0455  NA 141  < > 138  --   K 2.8*  < > 4.3 3.2*  CL 104  < > 103  --   CO2 26  < > 27  --   GLUCOSE 150*  < > 121*  --   BUN 16  < > 20  --   CREATININE 1.00  < > 1.18*  --   CALCIUM 9.0  < > 8.6*  --   MG  --   < > 1.9 1.8  AST 28  --   --   --   ALT 82*  --   --   --   ALKPHOS 79  --   --   --   BILITOT 4.6*  --   --   --   < > = values in this interval not displayed. ------------------------------------------------------------------------------------------------------------------  Cardiac Enzymes  Recent Labs Lab 05/30/16 0731  TROPONINI 0.09*   ------------------------------------------------------------------------------------------------------------------  RADIOLOGY:  No results found.  EKG:   Orders placed or performed during the hospital encounter of 05/30/16  . EKG 12-Lead  . EKG 12-Lead  . EKG - 12 lead  . EKG - 12 lead    ASSESSMENT AND PLAN:   Acute on chronic systolic heart failure: Noncompliance with medication. Continue IV Lasix, Coreg, lisinopril,  Aldactone, follow-up with advanced CHF clinic in [place].Marland Kitchen Continue IV Lasix a. Seen by cardiology. Monitor strict urine output. Discussed this with the nurse. #2. Hypoxia on admission because of CHF exacerbation: Improved right now, room air sats 96%. When she came 88%. #3. Abdominal pain secondary to CHF. Ascites: No fever at this time no indicationof SBP> . #4 .hypokalemia;replace k D/w family ,RN  CODE STATUS: full  TOTAL TIME TAKING CARE OF THIS PATIENT: 35 minutes.   POSSIBLE D/C IN 1-2 DAYS,  DEPENDING ON CLINICAL CONDITION.   Katha Hamming M.D on 06/01/2016 at 1:00 PM  Between 7am to 6pm - Pager - [phone]  After 6pm go to [email or web address] - password [email or web address] [facility]  [facility]  Office  [phone]  CC: Primary care physician; No PCP Per Patient   Note: This dictation was prepared with Dragon dictation along with smaller phrase technology. Any transcriptional errors that result from this process are unintentional.

## 2016-06-01 NOTE — Progress Notes (Signed)
MEDICATION RELATED CONSULT NOTE - INITIAL   Pharmacy Consult for K supp, Mag check Indication: hypokalemia  No Known Allergies  Patient Measurements: Height: 5\' 5"  (165.1 cm) Weight: 171 lb (77.6 kg) IBW/kg (Calculated) : 57   Vital Signs: Temp: 98.3 F (36.8 C) (10/20 1124) Temp Source: Oral (10/20 0811) BP: 131/87 (10/20 1124) Pulse Rate: 71 (10/20 1124) Intake/Output from previous day: 10/19 0701 - 10/20 0700 In: 720 [P.O.:720] Out: 2300 [Urine:2300] Intake/Output from this shift: Total I/O In: 120 [P.O.:120] Out: -   Labs:  Recent Labs  05/29/16 2354 05/30/16 0731 05/30/16 0737 05/31/16 0547 06/01/16 0455  WBC 6.5  --   --   --   --   HGB 12.0  --   --   --   --   HCT 37.6  --   --   --   --   PLT 218  --   --   --   --   CREATININE 1.00  --  1.14* 1.18*  --   MG  --  1.8  --  1.9 1.8  ALBUMIN 3.8  --   --   --   --   PROT 7.8  --   --   --   --   AST 28  --   --   --   --   ALT 82*  --   --   --   --   ALKPHOS 79  --   --   --   --   BILITOT 4.6*  --   --   --   --    Estimated Creatinine Clearance: 57.4 mL/min (by C-G formula based on SCr of 1.18 mg/dL (H)).     Assessment: Pharmacy consult or electrolyte replacement in this 50 year old female admitted for AECHF. Currently on lasix 40mg  IV Q8H  Goal of Therapy:  K ~4 per cards note Mag ~2  Plan:  Lasix increase to 40mg  IV Q8H. Ordered KCl x 1, Mag 4gm IV x 1 this morning. Cardiology adding KCl PO TID to start tonight. Will recheck electrolytes with AM labs.   Lynetta Tomczak C 06/01/2016,11:52 AM

## 2016-06-01 NOTE — Progress Notes (Signed)
Patient: Bonnie Ochoa / Admit Date: 05/30/2016 / Date of Encounter: 06/01/2016, 8:48 AM   Subjective: No acute overnight events. Minus 2.3 L for the admission. No renal function today. Still some SOB. Potassium low today. Repletion ordered.   Review of Systems: Review of Systems  Constitutional: Positive for malaise/fatigue. Negative for chills, diaphoresis, fever and weight loss.  HENT: Negative for congestion.   Eyes: Negative for discharge and redness.  Respiratory: Positive for cough and shortness of breath. Negative for hemoptysis, sputum production and wheezing.   Cardiovascular: Positive for leg swelling. Negative for chest pain, palpitations, orthopnea, claudication and PND.  Gastrointestinal: Negative for abdominal pain, blood in stool, heartburn, melena, nausea and vomiting.  Genitourinary: Negative for hematuria.  Musculoskeletal: Negative for falls and myalgias.  Skin: Negative for rash.  Neurological: Positive for weakness. Negative for dizziness, tingling, tremors, sensory change, speech change, focal weakness and loss of consciousness.  Endo/Heme/Allergies: Does not bruise/bleed easily.  Psychiatric/Behavioral: Negative for substance abuse. The patient is not nervous/anxious.   All other systems reviewed and are negative.   Objective: Telemetry: NSR, 70's bpm Physical Exam: Blood pressure (!) 123/98, pulse 73, temperature 97.6 F (36.4 C), temperature source Oral, resp. rate 16, height 5\' 5"  (1.651 m), weight 171 lb (77.6 kg), SpO2 100 %. Body mass index is 28.46 kg/m. General: Well developed, well nourished, in no acute distress. Head: Normocephalic, atraumatic, sclera non-icteric, no xanthomas, nares are without discharge. Neck: Negative for carotid bruits. JVP elevated. Lungs: Decreased breath sounds bilaterally. Breathing is unlabored. Heart: RRR S1 S2 without murmurs, rubs, or gallops.  Abdomen: Soft, non-tender, non-distended with normoactive bowel sounds.  No rebound/guarding. Extremities: No clubbing or cyanosis. Trace pre-tibial edema. Distal pedal pulses are 2+ and equal bilaterally. Neuro: Alert and oriented X 3. Moves all extremities spontaneously. Psych:  Responds to questions appropriately with a normal affect.   Intake/Output Summary (Last 24 hours) at 06/01/16 0848 Last data filed at 05/31/16 2100  Gross per 24 hour  Intake              720 ml  Output             2300 ml  Net            -1580 ml    Inpatient Medications:  . aspirin EC  81 mg Oral Daily  . atorvastatin  40 mg Oral q1800  . carvedilol  3.125 mg Oral BID WC  . docusate sodium  100 mg Oral BID  . enoxaparin (LOVENOX) injection  40 mg Subcutaneous Q24H  . furosemide  40 mg Intravenous Q8H  . lisinopril  20 mg Oral Daily  . magnesium sulfate 1 - 4 g bolus IVPB  4 g Intravenous Once  . omega-3 acid ethyl esters  1 capsule Oral BID  . pantoprazole  40 mg Oral Daily  . PARoxetine  30 mg Oral Daily  . potassium chloride  40 mEq Oral Once  . potassium chloride  40 mEq Oral Once  . potassium chloride  40 mEq Oral TID  . prazosin  1 mg Oral QHS  . QUEtiapine  300 mg Oral QHS  . sodium chloride flush  3 mL Intravenous Q12H  . sodium chloride flush  3 mL Intravenous Q12H  . spironolactone  25 mg Oral Daily   Infusions:    Labs:  Recent Labs  05/30/16 0737 05/31/16 0547 06/01/16 0455  NA 138 138  --   K 3.8 4.3 3.2*  CL  103 103  --   CO2 25 27  --   GLUCOSE 134* 121*  --   BUN 17 20  --   CREATININE 1.14* 1.18*  --   CALCIUM 8.5* 8.6*  --   MG  --  1.9 1.8    Recent Labs  05/29/16 2354  AST 28  ALT 82*  ALKPHOS 79  BILITOT 4.6*  PROT 7.8  ALBUMIN 3.8    Recent Labs  05/29/16 2354  WBC 6.5  HGB 12.0  HCT 37.6  MCV 92.0  PLT 218    Recent Labs  05/29/16 2354 05/30/16 0335 05/30/16 0731  TROPONINI 0.09* 0.08* 0.09*   Invalid input(s): POCBNP No results for input(s): HGBA1C in the last 72 hours.   Weights: Filed Weights    05/29/16 2327  Weight: 171 lb (77.6 kg)     Radiology/Studies:  Dg Chest 2 View  Result Date: 05/30/2016 CLINICAL DATA:  Acute onset of generalized abdominal pain and distention. Vomiting and diarrhea. Dark red blood noted in stool. Initial encounter. EXAM: CHEST  2 VIEW COMPARISON:  Chest radiograph performed 05/19/2016 FINDINGS: The lungs are well-aerated. Vascular congestion is noted. Increased interstitial markings raise concern for mild interstitial edema. There is no evidence of pleural effusion or pneumothorax. The heart is mildly enlarged. An AICD is noted at the left chest wall, with a single lead ending at the right ventricle. No acute osseous abnormalities are seen. IMPRESSION: Vascular congestion and mild cardiomegaly. Increased interstitial markings raise concern for mild interstitial edema. Electronically Signed   By: Bonnie RaiderJeffery  Chang M.D.   On: 05/30/2016 00:24   Dg Chest 2 View  Result Date: 05/19/2016 CLINICAL DATA:  Generalized body aches and chest pain. EXAM: CHEST  2 VIEW COMPARISON:  April 30, 2016 FINDINGS: Stable cardiomegaly and left AICD device. Mild atelectasis in the left mid lung. No other interval changes or acute abnormalities. IMPRESSION: No active cardiopulmonary disease. Electronically Signed   By: Gerome Samavid  Williams III M.D   On: 05/19/2016 23:47   Ct Abdomen Pelvis W Contrast  Result Date: 05/30/2016 CLINICAL DATA:  50 year old female with generalized abdominal pain, vomiting, and diarrhea. Jaundice. EXAM: CT ABDOMEN AND PELVIS WITH CONTRAST TECHNIQUE: Multidetector CT imaging of the abdomen and pelvis was performed using the standard protocol following bolus administration of intravenous contrast. CONTRAST:  100mL ISOVUE-300 IOPAMIDOL (ISOVUE-300) INJECTION 61% COMPARISON:  Abdominal CT dated 05/14/2016 FINDINGS: Lower chest: Small right pleural effusion. Left lung base linear atelectasis/scarring. The visualized lung bases are otherwise clear. There is  cardiomegaly with biatrial enlargement. A pacemaker wires partially visualized. There is retrograde flow of contrast from the right atrium into the IVC compatible with a degree of right cardiac dysfunction. Correlation with echocardiogram recommended. No intra-abdominal free air.  Small ascites. Hepatobiliary: Heterogeneous enhancement of the liver with nutmeg appearance most compatible with congestive hepatopathy. There is mild irregularity of the hepatic contour which may be related to underlying cirrhosis. Correlation with clinical exam recommended. No intrahepatic biliary ductal dilatation. No gallstone. Pancreas: Unremarkable. No pancreatic ductal dilatation or surrounding inflammatory changes. Spleen: Normal in size without focal abnormality. Adrenals/Urinary Tract: Adrenal glands are unremarkable. Kidneys are normal, without renal calculi, focal lesion, or hydronephrosis. Bladder is unremarkable. Stomach/Bowel: Oral contrast noted within the stomach and opacifying multiple loops of small bowel and traversing into the colon without evidence of bowel obstruction. Mildly thickened loops of small bowel may be related to underdistention or hepatic enteropathy. Enteritis is less likely but not entirely excluded. Clinical correlation  is recommended. Normal appendix. Vascular/Lymphatic: The abdominal aorta appears unremarkable. The origins of the celiac axis, SMA, IMA as well as the origins of the renal arteries are patent. No portal venous gas identified. There is no adenopathy. Reproductive: Hysterectomy. Other: There is diffuse subcutaneous edema and anasarca. Musculoskeletal: Multilevel degenerative changes of the spine most prominent at L4-L5 where there is endplate irregularity and sclerosis. No acute fracture. IMPRESSION: Cardiomegaly with evidence of right cardiac dysfunction. Correlation with echocardiogram recommended. Congestive hepatopathy with possible mild underlying cirrhosis. Correlation with clinical  exam and liver function tests recommended. Ultrasound may provide better evaluation of the liver contour. Small right pleural effusion, small ascites, and anasarca. Segmental thickening of the small bowel in the mid abdomen may be related to underdistention or hepatic enteropathy. Enteritis is not excluded. Clinical correlation is recommended. No bowel obstruction. Normal appendix. Electronically Signed   By: Elgie Collard M.D.   On: 05/30/2016 06:06   Ct Abdomen Pelvis W Contrast  Result Date: 05/14/2016 CLINICAL DATA:  Nausea,  vomiting and diarrhea for 1 month. EXAM: CT ABDOMEN AND PELVIS WITH CONTRAST TECHNIQUE: Multidetector CT imaging of the abdomen and pelvis was performed using the standard protocol following bolus administration of intravenous contrast. CONTRAST:  ISOVUE-300 IOPAMIDOL (ISOVUE-300) INJECTION 61% COMPARISON:  None. FINDINGS: Lower chest: The lung bases are clear. Hepatobiliary: Hepatic steatosis noted. There is reflux of contrast material from the right atrium into the hepatic veins compatible with passive venous congestion. No focal liver abnormalities. Diffuse gallbladder wall edema is identified. No biliary dilatation noted. Pancreas: Unremarkable. No pancreatic ductal dilatation or surrounding inflammatory changes. Spleen: Normal in size without focal abnormality. Adrenals/Urinary Tract: Adrenal glands are unremarkable. Kidneys are normal, without renal calculi, focal lesion, or hydronephrosis. Bladder is unremarkable. Stomach/Bowel: Stomach is within normal limits. Appendix appears normal. No evidence of bowel wall thickening, distention, or inflammatory changes. Vascular/Lymphatic: Calcified atherosclerotic disease involves the abdominal aorta. No aneurysm. No enlarged retroperitoneal or mesenteric adenopathy. No enlarged pelvic or inguinal lymph nodes. Reproductive: Status post hysterectomy. No adnexal masses. Other: There is moderate ascites within the abdomen and pelvis.  Diffuse body wall edema is identified. Musculoskeletal: Degenerative disc disease is identified at the L4-5 level. IMPRESSION: 1. Moderate abdominal ascites and body wall edema. 2. Reflux of contrast material into the IVC and hepatic veins compatible with passive venous congestion which may be seen with right heart failure. 3. Gallbladder wall edema is nonspecific in the setting of ascites and anasarca. 4. Aortic atherosclerosis 5. Lumbar degenerative disc disease. Electronically Signed   By: Signa Kell M.D.   On: 05/14/2016 10:59     Assessment and Plan  Principal Problem:   Acute on chronic systolic CHF (congestive heart failure) (HCC) Active Problems:   Pulmonary hypertension   Hypokalemia   NICM (nonischemic cardiomyopathy) (HCC)   Elevated troponin   Other ascites    1. Acute on chronic systolic CHF/pulmonary hypertension/NICM s/p SJM ICD: -She remains volume overloaded -Increase IV Lasix to 40 mg q 8 hours with KCl repletion  -Compliance is a concern  -Continue spironolactone to 25 mg daily -Continue low-dose Coreg 3.125 mg bid given possible decompensation  -Continue lisinopril to 40 mg daily -Consider adding hydralazine/Imdur on 10/20 -If UOP drops off she may need milrinone infusion  -CHF education needed, suggest nutrition consult -Status post diuresis she will need a RHC -Will need follow up with the advanced heart failure team in [place] -Should she demonstrate medication compliance and tolerate optimal medical therapy, down the  road she could discuss more invasive heart failure therapies with the advanced heart failure team. At this time she is not a candidate for these therapies given non-compliance and not on optimal medical therapy   2. Hypokalemia: -Repletion ordered  3. Ascites/anasarca/congestive hepatopathy/possible mild cirrhosis: -Previous heavy alcohol abuse -As above -Has previously required paracentesis in 2016 -Imaging shows small ascites, this is less  likely to be a SBP issue, though cannot rule this out -If indicated consider diagnostic paracentesis to rule out SBP  4. Elevated troponin: -Mildly elevated and flat trending likely in the setting of supply demand ischemia 2/2 volume overload and low output -She reports a recent nonobstructive cardiac cath in [place], [place] in January 2016. Will attempt to obtain these records -Recent echo as above -If unable to obtain the records from [place] she may require outpatient ischemic evaluation along with a RHC s/p diuresis  Signed, Eula Listen, PA-C [facility] Pager: [phone] 06/01/2016, 8:48 AM

## 2016-06-01 NOTE — Progress Notes (Signed)
Awakened to complain of return of abdominal pain with leg and knee pain.  Rates 10/10 upon questioning.  Requesting more pain medicine.  Dr Anne Hahn notified of complaints.

## 2016-06-02 LAB — BASIC METABOLIC PANEL
Anion gap: 9 (ref 5–15)
BUN: 23 mg/dL — AB (ref 6–20)
CHLORIDE: 102 mmol/L (ref 101–111)
CO2: 29 mmol/L (ref 22–32)
Calcium: 9.1 mg/dL (ref 8.9–10.3)
Creatinine, Ser: 1.08 mg/dL — ABNORMAL HIGH (ref 0.44–1.00)
GFR calc Af Amer: >60 mL/min (ref >60)
GFR calc non Af Amer: 58 mL/min — ABNORMAL LOW (ref >60)
Glucose, Bld: 154 mg/dL — ABNORMAL HIGH (ref 65–99)
POTASSIUM: 4.6 mmol/L (ref 3.5–5.1)
SODIUM: 140 mmol/L (ref 135–145)

## 2016-06-02 LAB — MAGNESIUM: MAGNESIUM: 2.2 mg/dL (ref 1.7–2.4)

## 2016-06-02 MED ORDER — OXYCODONE HCL 5 MG PO TABS
5.0000 mg | ORAL_TABLET | Freq: Four times a day (QID) | ORAL | Status: DC | PRN
  Administered 2016-06-02 – 2016-06-04 (×6): 5 mg via ORAL
  Filled 2016-06-02 (×6): qty 1

## 2016-06-02 NOTE — Progress Notes (Signed)
     SUBJECTIVE: breathing is better but still not back to baseline.   Tele: sinus  BP (!) 150/111   Pulse 87   Temp 98.2 F (36.8 C) (Oral)   Resp 18   Ht 5\' 5"  (1.651 m)   Wt 160 lb 11.2 oz (72.9 kg)   SpO2 98%   BMI 26.74 kg/m   Intake/Output Summary (Last 24 hours) at 06/02/16 1046 Last data filed at 06/02/16 1016  Gross per 24 hour  Intake              360 ml  Output             3900 ml  Net            -3540 ml    PHYSICAL EXAM General: Well developed, well nourished, in no acute distress. Alert and oriented x 3.  Psych:  Good affect, responds appropriately Neck: + JVD. No masses noted.  Lungs: crackles bilateral bases. no wheezes or rhonci noted.  Heart: RRR with no murmurs noted. Abdomen: Bowel sounds are present. Distended, Soft, TTP.   Extremities: No lower extremity edema.   LABS: Basic Metabolic Panel:  Recent Labs  99/24/26 0547 06/01/16 0455 06/02/16 0640  NA 138  --  140  K 4.3 3.2* 4.6  CL 103  --  102  CO2 27  --  29  GLUCOSE 121*  --  154*  BUN 20  --  23*  CREATININE 1.18*  --  1.08*  CALCIUM 8.6*  --  9.1  MG 1.9 1.8 2.2    Current Meds: . aspirin EC  81 mg Oral Daily  . atorvastatin  40 mg Oral q1800  . carvedilol  3.125 mg Oral BID WC  . docusate sodium  100 mg Oral BID  . enoxaparin (LOVENOX) injection  40 mg Subcutaneous Q24H  . furosemide  40 mg Intravenous Q8H  . lisinopril  20 mg Oral Daily  . omega-3 acid ethyl esters  1 capsule Oral BID  . pantoprazole  40 mg Oral Daily  . PARoxetine  30 mg Oral Daily  . potassium chloride  40 mEq Oral TID  . prazosin  1 mg Oral QHS  . QUEtiapine  300 mg Oral QHS  . sodium chloride flush  3 mL Intravenous Q12H  . sodium chloride flush  3 mL Intravenous Q12H  . spironolactone  25 mg Oral Daily     ASSESSMENT AND PLAN: 50 yo female with history of NICM s/p ICD, chronic systolic CHF, Pulm HTN, HTN, DM admitted 05/30/16 with volume overload with report of medication non-compliance.     1. Acute on chronic systolic CHF/Non-ischemic cardiomyopathy: Pt admitted with volume overload due to non-compliance. Echo with LVEF=25%, moderate MR. She has diuresed well with IV Lasix. (-5.4 liters since admission and by charting down 11 lbs). It is not clear what her dry weight is but I think she is still volume overloaded based on her exam (JVD and basilar crackles). Her abdominal swelling due to ascites will be hard to use to gauge her volume excess. Renal function is stable. Would contiue diuresis with IV Lasix today.Follow renal function. Continue ASA, Coreg, Lisinopril, aldactone.    Bonnie Ochoa  10/21/201710:46 AM

## 2016-06-02 NOTE — Care Management Note (Signed)
Case Management Note  Patient Details  Name: Doneen Bellanca MRN: [id number] Date of Birth: May 19, 1966  Subjective/Objective:      [facility] provided a scales for daily weights as ordered by Dr Luberta Mutter per Ms Rubye Oaks has CHF and edema.               Action/Plan:   Expected Discharge Date:                  Expected Discharge Plan:  Home w Home Health Services  In-House Referral:     Discharge planning Services  CM Consult  Post Acute Care Choice:    Choice offered to:     DME Arranged:    DME Agency:     HH Arranged:    HH Agency:     Status of Service:  Completed, signed off  If discussed at [employer] of Stay Meetings, dates discussed:    Additional Comments:  Evadean Sproule A, RN 06/02/2016, 1:47 PM

## 2016-06-02 NOTE — Progress Notes (Signed)
Pt agrees to watch both CHF videos. Case management notified to assist with getting patient a scale for home use. CHF packet given. I will continue to monitor.

## 2016-06-02 NOTE — Progress Notes (Signed)
[facility] - [place] at [facility]   PATIENT NAME: Bonnie MornCheryl Ochoa    MR#:  [id number]  DATE OF BIRTH:  01-Dec-1965  SUBJECTIVE: Still short of breath. And hypoxic. Parents missed no chest pressure on and off since admission. diursed 5 litres  since admission. Less short of breath on ambulation   CHIEF COMPLAINT:   Chief Complaint  Patient presents with  . Abdominal Pain  . Emesis  . Chest Pain    REVIEW OF SYSTEMS:   ROS CONSTITUTIONAL: No fever, fatigue or weakness.  EYES: No blurred or double vision.  EARS, NOSE, AND THROAT: No tinnitus or ear pain.  RESPIRATORY: No cough,  wheezing or hemoptysis. SOB with exertion, CARDIOVASCULAR: No chest pain, orthopnea, edema.  GASTROINTESTINAL: No nausea, vomiting, diarrhea or abdominal pain.  GENITOURINARY: No dysuria, hematuria.  ENDOCRINE: No polyuria, nocturia,  HEMATOLOGY: No anemia, easy bruising or bleeding SKIN: No rash or lesion. MUSCULOSKELETAL:trace pedal edema NEUROLOGIC: No tingling, numbness, weakness.  PSYCHIATRY: No anxiety or depression.   DRUG ALLERGIES:  No Known Allergies  VITALS:  Blood pressure (!) 140/96, pulse 75, temperature 97.7 F (36.5 C), temperature source Oral, resp. rate 20, height 5\' 5"  (1.651 m), weight 72.9 kg (160 lb 11.2 oz), SpO2 91 %.  PHYSICAL EXAMINATION:  GENERAL:  50 y.o.-year-old patient lying in the bed with no acute distress.  EYES: Pupils equal, round, reactive to light and accommodation. No scleral icterus. Extraocular muscles intact.  HEENT: Head atraumatic, normocephalic. Oropharynx and nasopharynx clear.  NECK:  Supple, no jugular venous distention. No thyroid enlargement, no tenderness. Has JVD> LUNGS: Normal breath sounds bilaterally, Patient has faint bibasilar crackles worse on the left side. CARDIOVASCULAR: S1, S2 normal. No murmurs, rubs, or gallops.  ABDOMEN: Soft, nontender, nondistended. Bowel sounds present. No organomegaly or mass.   EXTREMITIES: No pedal edema, cyanosis, or clubbing.  NEUROLOGIC: Cranial nerves II through XII are intact. Muscle strength 5/5 in all extremities. Sensation intact. Gait not checked.  PSYCHIATRIC: The patient is alert and oriented x 3.  SKIN: No obvious rash, lesion, or ulcer.    LABORATORY PANEL:   CBC  Recent Labs Lab 05/29/16 2354  WBC 6.5  HGB 12.0  HCT 37.6  PLT 218   ------------------------------------------------------------------------------------------------------------------  Chemistries   Recent Labs Lab 05/29/16 2354  06/02/16 0640  NA 141  < > 140  K 2.8*  < > 4.6  CL 104  < > 102  CO2 26  < > 29  GLUCOSE 150*  < > 154*  BUN 16  < > 23*  CREATININE 1.00  < > 1.08*  CALCIUM 9.0  < > 9.1  MG  --   < > 2.2  AST 28  --   --   ALT 82*  --   --   ALKPHOS 79  --   --   BILITOT 4.6*  --   --   < > = values in this interval not displayed. ------------------------------------------------------------------------------------------------------------------  Cardiac Enzymes  Recent Labs Lab 05/30/16 0731  TROPONINI 0.09*   ------------------------------------------------------------------------------------------------------------------  RADIOLOGY:  No results found.  EKG:   Orders placed or performed during the hospital encounter of 05/30/16  . EKG 12-Lead  . EKG 12-Lead  . EKG - 12 lead  . EKG - 12 lead    ASSESSMENT AND PLAN:   Acute on chronic systolic heart failure: Noncompliance with medication. Continue IV Lasix, Coreg, lisinopril, Aldactone, follow-up with advanced CHF clinic in [place].Marland Kitchen. Continue IV Lasix a.  Seen by cardiology. Monitor strict urine output. Patient had about 4 L of urine output since admission. Because still has some rales ,JVD continue IV Lasix for today also. #2. Hypoxia on admission because of CHF exacerbation: Improved right now, room air sats 96%. When she came 88%. #3. Abdominal pain secondary to CHF. Ascites: No fever at  this time no indicationof SBP> . #4 .hypokalemia;replace k D/w family ,RN  CODE STATUS: full  TOTAL TIME TAKING CARE OF THIS PATIENT: 35 minutes.   POSSIBLE D/C IN 1-2 DAYS, DEPENDING ON CLINICAL CONDITION.   Katha Hamming M.D on 06/02/2016 at 11:54 AM  Between 7am to 6pm - Pager - [phone]  After 6pm go to [email or web address] - password [email or web address] [facility]  [place] [facility]  Office  [phone]  CC: Primary care physician; No PCP Per Patient   Note: This dictation was prepared with Dragon dictation along with smaller phrase technology. Any transcriptional errors that result from this process are unintentional.

## 2016-06-03 LAB — BASIC METABOLIC PANEL
ANION GAP: 8 (ref 5–15)
BUN: 23 mg/dL — AB (ref 6–20)
CALCIUM: 9.5 mg/dL (ref 8.9–10.3)
CO2: 29 mmol/L (ref 22–32)
Chloride: 101 mmol/L (ref 101–111)
Creatinine, Ser: 1.23 mg/dL — ABNORMAL HIGH (ref 0.44–1.00)
GFR calc Af Amer: 57 mL/min — ABNORMAL LOW (ref >60)
GFR, EST NON AFRICAN AMERICAN: 50 mL/min — AB (ref >60)
GLUCOSE: 176 mg/dL — AB (ref 65–99)
POTASSIUM: 4.8 mmol/L (ref 3.5–5.1)
SODIUM: 138 mmol/L (ref 135–145)

## 2016-06-03 NOTE — Progress Notes (Addendum)
     SUBJECTIVE: Breathing is much improved.   Tele: sinus  BP (!) 135/102   Pulse 84   Temp 97.6 F (36.4 C) (Oral)   Resp 16   Ht 5\' 5"  (1.651 m)   Wt 153 lb 8 oz (69.6 kg)   SpO2 100%   BMI 25.54 kg/m   Intake/Output Summary (Last 24 hours) at 06/03/16 0842 Last data filed at 06/03/16 2549  Gross per 24 hour  Intake              360 ml  Output             2900 ml  Net            -2540 ml    PHYSICAL EXAM General: Well developed, well nourished, in no acute distress. Alert and oriented x 3.  Psych:  Good affect, responds appropriately Neck: No JVD. No masses noted.  Lungs: Overall clear. No wheezes or rhonci noted.  Heart: RRR with no murmurs noted. Abdomen: Bowel sounds are present. Soft, distended, TTP Extremities: No lower extremity edema.   LABS: Basic Metabolic Panel:  Recent Labs  82/64/15 0455 06/02/16 0640  NA  --  140  K 3.2* 4.6  CL  --  102  CO2  --  29  GLUCOSE  --  154*  BUN  --  23*  CREATININE  --  1.08*  CALCIUM  --  9.1  MG 1.8 2.2    Current Meds: . aspirin EC  81 mg Oral Daily  . atorvastatin  40 mg Oral q1800  . carvedilol  3.125 mg Oral BID WC  . docusate sodium  100 mg Oral BID  . enoxaparin (LOVENOX) injection  40 mg Subcutaneous Q24H  . furosemide  40 mg Intravenous Q8H  . lisinopril  20 mg Oral Daily  . omega-3 acid ethyl esters  1 capsule Oral BID  . pantoprazole  40 mg Oral Daily  . PARoxetine  30 mg Oral Daily  . potassium chloride  40 mEq Oral TID  . prazosin  1 mg Oral QHS  . QUEtiapine  300 mg Oral QHS  . sodium chloride flush  3 mL Intravenous Q12H  . sodium chloride flush  3 mL Intravenous Q12H  . spironolactone  25 mg Oral Daily     ASSESSMENT AND PLAN: 50 yo female with history of NICM s/p ICD, chronic systolic CHF, Pulm HTN, HTN, DM admitted 05/30/16 with volume overload with report of medication non-compliance.    1. Acute on chronic systolic CHF/Non-ischemic cardiomyopathy: Pt admitted with volume  overload due to medication non-compliance. Echo with LVEF=25%, moderate MR. This is felt to be a non-ischemic cardiomyopathy. She has diuresed well with IV Lasix. (-2.5 liters last 24 hours, -8.1 liters since admission and by charting down ?18 lbs). It is not clear what her dry weight is but I think she is nearing euvolemia. Renal function has been stable, pending this am.  -I think she is near an overall euvolemic state. She has continued abdominal swelling due to ascites. Consider changing to po Lasix after the IV dose in the am. Would repeat BMET today.  -Continue ASA, Coreg, Lisinopril, aldactone.     She will likely be ready for discharge later today or tomorrow. She will need close f/u in our [facility] office following discharge. Consider referral to CHF clinic which will be planned at her f/u in our office.   Verne Carrow  10/22/20178:42 AM

## 2016-06-03 NOTE — Progress Notes (Signed)
[facility] - [place] at [facility]   PATIENT NAME: Bonnie Ochoa    MR#:  [id number]  DATE OF BIRTH:  Nov 21, 1965  SUBJECTIVE: Patient has shortness of breath. But better than yesterday. When complaint abdominal pain. Schedule her for tomorrow diagnostic paracentesis to evaluate for SBP. Although unlikely.   CHIEF COMPLAINT:   Chief Complaint  Patient presents with  . Abdominal Pain  . Emesis  . Chest Pain    REVIEW OF SYSTEMS:   ROS CONSTITUTIONAL: No fever, fatigue or weakness.  EYES: No blurred or double vision.  EARS, NOSE, AND THROAT: No tinnitus or ear pain.  RESPIRATORY: No cough,  wheezing or hemoptysis. SOB with exertion, CARDIOVASCULAR: No chest pain, orthopnea, edema.  GASTROINTESTINAL: No nausea, vomiting, diarrhea or abdominal pain.  GENITOURINARY: No dysuria, hematuria.  ENDOCRINE: No polyuria, nocturia,  HEMATOLOGY: No anemia, easy bruising or bleeding SKIN: No rash or lesion. MUSCULOSKELETAL:trace pedal edema NEUROLOGIC: No tingling, numbness, weakness.  PSYCHIATRY: No anxiety or depression.   DRUG ALLERGIES:  No Known Allergies  VITALS:  Blood pressure (!) 135/102, pulse 84, temperature 97.6 F (36.4 C), temperature source Oral, resp. rate 16, height 5\' 5"  (1.651 m), weight 69.6 kg (153 lb 8 oz), SpO2 100 %.  PHYSICAL EXAMINATION:  GENERAL:  50 y.o.-year-old patient lying in the bed with no acute distress.  EYES: Pupils equal, round, reactive to light and accommodation. No scleral icterus. Extraocular muscles intact.  HEENT: Head atraumatic, normocephalic. Oropharynx and nasopharynx clear.  NECK:  Supple, no jugular venous distention. No thyroid enlargement, no tenderness. Has JVD> LUNGS: Normal breath sounds bilaterally, Patient has faint bibasilar crackles worse on the left side. CARDIOVASCULAR: S1, S2 normal. No murmurs, rubs, or gallops.  ABDOMEN: Soft, nontender, nondistended. Bowel sounds present. No organomegaly or mass.   EXTREMITIES: No pedal edema, cyanosis, or clubbing.  NEUROLOGIC: Cranial nerves II through XII are intact. Muscle strength 5/5 in all extremities. Sensation intact. Gait not checked.  PSYCHIATRIC: The patient is alert and oriented x 3.  SKIN: No obvious rash, lesion, or ulcer.    LABORATORY PANEL:   CBC  Recent Labs Lab 05/29/16 2354  WBC 6.5  HGB 12.0  HCT 37.6  PLT 218   ------------------------------------------------------------------------------------------------------------------  Chemistries   Recent Labs Lab 05/29/16 2354  06/02/16 0640 06/03/16 0941  NA 141  < > 140 138  K 2.8*  < > 4.6 4.8  CL 104  < > 102 101  CO2 26  < > 29 29  GLUCOSE 150*  < > 154* 176*  BUN 16  < > 23* 23*  CREATININE 1.00  < > 1.08* 1.23*  CALCIUM 9.0  < > 9.1 9.5  MG  --   < > 2.2  --   AST 28  --   --   --   ALT 82*  --   --   --   ALKPHOS 79  --   --   --   BILITOT 4.6*  --   --   --   < > = values in this interval not displayed. ------------------------------------------------------------------------------------------------------------------  Cardiac Enzymes  Recent Labs Lab 05/30/16 0731  TROPONINI 0.09*   ------------------------------------------------------------------------------------------------------------------  RADIOLOGY:  No results found.  EKG:   Orders placed or performed during the hospital encounter of 05/30/16  . EKG 12-Lead  . EKG 12-Lead  . EKG - 12 lead  . EKG - 12 lead    ASSESSMENT AND PLAN:   Acute on chronic systolic  heart failure: Noncompliance with medication. , Coreg, lisinopril, Aldactone, follow-up with advanced CHF clinic in [place].Marland Kitchen. Changed to by mouth Lasix today.  #2. Hypoxia on admission because of CHF exacerbation: Improved right now, room air sats 96%. When she came 88%. #3. Abdominal pain secondary to CHF. Ascites:  Family Requesting diagnostic paracentesis to evaluate, has abdominal pain which is chronic ordered place. Likely  tomorrow.  . #4 .hypokalemia;replaced  k D/w family ,RN  CODE STATUS: full  TOTAL TIME TAKING CARE OF THIS PATIENT: 35 minutes.   POSSIBLE D/C IN 1-2 DAYS, DEPENDING ON CLINICAL CONDITION.   Katha HammingKONIDENA,Real Cona M.D on 06/03/2016 at 10:15 AM  Between 7am to 6pm - Pager - [phone]  After 6pm go to [email or web address] - password [email or web address] [email or web address]  [place] [facility]  Office  [phone]  CC: Primary care physician; No PCP Per Patient   Note: This dictation was prepared with Dragon dictation along with smaller phrase technology. Any transcriptional errors that result from this process are unintentional.

## 2016-06-03 NOTE — Progress Notes (Signed)
MEDICATION RELATED CONSULT NOTE - INITIAL   Pharmacy Consult for K supp, Mag check Indication: hypokalemia  No Known Allergies  Patient Measurements: Height: 5\' 5"  (165.1 cm) Weight: 153 lb 8 oz (69.6 kg) IBW/kg (Calculated) : 57   Vital Signs: Temp: 97 F (36.1 C) (10/22 1153) Temp Source: Oral (10/22 0820) BP: 141/97 (10/22 1153) Pulse Rate: 83 (10/22 1153) Intake/Output from previous day: 10/21 0701 - 10/22 0700 In: 360 [P.O.:360] Out: 2900 [Urine:2900] Intake/Output from this shift: Total I/O In: 120 [P.O.:120] Out: 0   Labs:  Recent Labs  06/01/16 0455 06/02/16 0640 06/03/16 0941  CREATININE  --  1.08* 1.23*  MG 1.8 2.2  --    Estimated Creatinine Clearance: 52.4 mL/min (by C-G formula based on SCr of 1.23 mg/dL (H)).     Assessment: Pharmacy consult or electrolyte replacement in this 50 year old female admitted for AECHF. Currently on lasix 40mg  IV Q8H  Goal of Therapy:  K ~4 per cards note Mag ~2  Plan:  Lasix increase to 40mg  IV Q8H. No further supplementation warranted currently. Will recheck labs in 48 hours   Bonnie Ochoa D 06/03/2016,11:57 AM

## 2016-06-03 NOTE — Progress Notes (Signed)
Notified Dr. Anne Hahn of patient's continued pain despite having given ultram. Oxy IR ordered. Will continue to monitor.

## 2016-06-04 ENCOUNTER — Inpatient Hospital Stay: Payer: Medicare Other

## 2016-06-04 LAB — BASIC METABOLIC PANEL
Anion gap: 6 (ref 5–15)
BUN: 22 mg/dL — AB (ref 6–20)
CALCIUM: 9.4 mg/dL (ref 8.9–10.3)
CO2: 29 mmol/L (ref 22–32)
CREATININE: 1.32 mg/dL — AB (ref 0.44–1.00)
Chloride: 101 mmol/L (ref 101–111)
GFR calc Af Amer: 53 mL/min — ABNORMAL LOW (ref >60)
GFR calc non Af Amer: 45 mL/min — ABNORMAL LOW (ref >60)
GLUCOSE: 149 mg/dL — AB (ref 65–99)
Potassium: 4.5 mmol/L (ref 3.5–5.1)
Sodium: 136 mmol/L (ref 135–145)

## 2016-06-04 LAB — MAGNESIUM: Magnesium: 2 mg/dL (ref 1.7–2.4)

## 2016-06-04 MED ORDER — POTASSIUM CHLORIDE CRYS ER 20 MEQ PO TBCR
20.0000 meq | EXTENDED_RELEASE_TABLET | Freq: Two times a day (BID) | ORAL | Status: DC
  Administered 2016-06-04: 20 meq via ORAL
  Filled 2016-06-04: qty 1

## 2016-06-04 MED ORDER — NITROGLYCERIN 0.4 MG SL SUBL: 0.4000 mg | SUBLINGUAL_TABLET | SUBLINGUAL | 12 refills | Status: DC | PRN

## 2016-06-04 MED ORDER — FUROSEMIDE 40 MG PO TABS
40.0000 mg | ORAL_TABLET | Freq: Two times a day (BID) | ORAL | Status: DC
  Administered 2016-06-04: 40 mg via ORAL
  Filled 2016-06-04: qty 1

## 2016-06-04 MED ORDER — POTASSIUM CHLORIDE 20 MEQ PO PACK: 20.0000 meq | PACK | Freq: Two times a day (BID) | ORAL | 2 refills | Status: DC

## 2016-06-04 MED ORDER — FUROSEMIDE 40 MG PO TABS: 40.0000 mg | ORAL_TABLET | Freq: Two times a day (BID) | ORAL | 2 refills | Status: DC

## 2016-06-04 MED ORDER — CARVEDILOL 6.25 MG PO TABS: 6.2500 mg | ORAL_TABLET | Freq: Two times a day (BID) | ORAL | 2 refills | Status: DC

## 2016-06-04 MED ORDER — TRAMADOL HCL 50 MG PO TABS: 50.0000 mg | ORAL_TABLET | Freq: Three times a day (TID) | ORAL | 0 refills | Status: DC | PRN

## 2016-06-04 MED ORDER — SPIRONOLACTONE 25 MG PO TABS: 25.0000 mg | ORAL_TABLET | Freq: Every day | ORAL | 2 refills | Status: DC

## 2016-06-04 MED ORDER — LISINOPRIL 20 MG PO TABS: 20.0000 mg | ORAL_TABLET | Freq: Every day | ORAL | 2 refills | Status: DC

## 2016-06-04 MED ORDER — ATORVASTATIN CALCIUM 40 MG PO TABS: 40.0000 mg | ORAL_TABLET | Freq: Every day | ORAL | 2 refills | Status: DC

## 2016-06-04 MED ORDER — QUETIAPINE FUMARATE 300 MG PO TABS: 300.0000 mg | ORAL_TABLET | Freq: Every day | ORAL | 2 refills | Status: DC

## 2016-06-04 MED ORDER — ASPIRIN EC 81 MG PO TBEC: 81.0000 mg | DELAYED_RELEASE_TABLET | Freq: Every day | ORAL | 2 refills | Status: DC

## 2016-06-04 MED ORDER — PAROXETINE HCL 30 MG PO TABS
30.0000 mg | ORAL_TABLET | Freq: Every day | ORAL | 2 refills | Status: DC
Start: 2016-06-04 — End: 2016-12-28

## 2016-06-04 NOTE — Care Management Note (Signed)
Case Management Note  Patient Details  Name: Bonnie Ochoa MRN: [id number] Date of Birth: 04-22-1966  Subjective/Objective:    Bonnie Ochoa has out of state Medicaid which she has not yet transferred to [facility]. She reports that she has now found the paperwork she needs to transfer her Medicaid to [place]. In the meantime, Bonnie Ochoa is being discharged home today from [facility] with medications which she cannot afford. She verbalized to this writer her understanding  to take her application to either the [facility] or to the [facility] along with her prescriptions and to call a Clinic on the list of clinics which treat the uninsured to locate a PCP.              Action/Plan:   Expected Discharge Date:                  Expected Discharge Plan:  Home w Home Health Services  In-House Referral:     Discharge planning Services  CM Consult  Post Acute Care Choice:    Choice offered to:     DME Arranged:    DME Agency:     HH Arranged:    HH Agency:     Status of Service:  Completed, signed off  If discussed at [employer] of Stay Meetings, dates discussed:    Additional Comments:  Bonnie Ochoa A, RN 06/04/2016, 2:48 PM

## 2016-06-04 NOTE — Progress Notes (Signed)
MEDICATION RELATED CONSULT NOTE - INITIAL   Pharmacy Consult for K supp, Mag check Indication: hypokalemia  No Known Allergies  Patient Measurements: Height: 5\' 5"  (165.1 cm) Weight: 150 lb 3.2 oz (68.1 kg) IBW/kg (Calculated) : 57   Vital Signs: Temp: 97.8 F (36.6 C) (10/23 1120) Temp Source: Oral (10/23 1120) BP: 108/81 (10/23 1120) Pulse Rate: 79 (10/23 1120) Intake/Output from previous day: 10/22 0701 - 10/23 0700 In: 603 [P.O.:600; I.V.:3] Out: 1900 [Urine:1900] Intake/Output from this shift: Total I/O In: 243 [P.O.:240; I.V.:3] Out: 850 [Urine:850]  Labs:  Recent Labs  06/02/16 0640 06/03/16 0941 06/04/16 0459  CREATININE 1.08* 1.23* 1.32*  MG 2.2  --  2.0   Estimated Creatinine Clearance: 44.9 mL/min (by C-G formula based on SCr of 1.32 mg/dL (H)).     Assessment: Pharmacy consult or electrolyte replacement in this 50 year old female admitted for AECHF. Currently on lasix 40mg  IV Q8H  Goal of Therapy:  K ~4 per cards note Mag ~2  Plan:  Electrolytes wnl and goals. Will recheck in 48 hours.    Merrel Crabbe D 06/04/2016,1:24 PM

## 2016-06-04 NOTE — Progress Notes (Signed)
Chaplain visited patient when she was about to pack her stuff and go home. With a smile on her face she said, "I am going home. I can't wait to get home and see my pet whom I miss very much." Chaplain prayed with the patient and she resumed her packing.

## 2016-06-04 NOTE — Progress Notes (Signed)
Patient: Bonnie Ochoa / Admit Date: 05/30/2016 / Date of Encounter: 06/04/2016, 7:34 AM   Subjective: Breathing better. No complaints this morning. Has ambulated with improved breathing. Renal function slightly up trending to 1.32 today. Minus 9+ L for the admission and 1.9 L for the past 24 hours.   Review of Systems: Review of Systems  Constitutional: Positive for malaise/fatigue. Negative for chills, diaphoresis, fever and weight loss.  HENT: Negative for congestion.   Eyes: Negative for discharge and redness.  Respiratory: Negative for cough, hemoptysis, sputum production, shortness of breath and wheezing.   Cardiovascular: Negative for chest pain, palpitations, orthopnea, claudication, leg swelling and PND.  Gastrointestinal: Negative for abdominal pain, blood in stool, heartburn, melena, nausea and vomiting.  Genitourinary: Negative for hematuria.  Musculoskeletal: Negative for falls and myalgias.  Skin: Negative for rash.  Neurological: Negative for dizziness, tingling, tremors, sensory change, speech change, focal weakness, loss of consciousness and weakness.  Endo/Heme/Allergies: Does not bruise/bleed easily.  Psychiatric/Behavioral: Negative for substance abuse. The patient is not nervous/anxious.   All other systems reviewed and are negative.   Objective: Telemetry: NSR, 80's bpm Physical Exam: Blood pressure (!) 127/91, pulse 86, temperature 98 F (36.7 C), resp. rate 16, height 5\' 5"  (1.651 m), weight 150 lb 3.2 oz (68.1 kg), SpO2 99 %. Body mass index is 24.99 kg/m. General: Well developed, well nourished, in no acute distress. Head: Normocephalic, atraumatic, sclera non-icteric, no xanthomas, nares are without discharge. Neck: Negative for carotid bruits. JVP not elevated. Lungs: Clear bilaterally to auscultation without wheezes, rales, or rhonchi. Breathing is unlabored. Heart: RRR S1 S2 without murmurs, rubs, or gallops.  Abdomen: Soft, non-tender,  non-distended with normoactive bowel sounds. No rebound/guarding. Extremities: No clubbing or cyanosis. No edema. Distal pedal pulses are 2+ and equal bilaterally. Neuro: Alert and oriented X 3. Moves all extremities spontaneously. Psych:  Responds to questions appropriately with a normal affect.   Intake/Output Summary (Last 24 hours) at 06/04/16 0734 Last data filed at 06/04/16 0134  Gross per 24 hour  Intake              603 ml  Output             1900 ml  Net            -1297 ml    Inpatient Medications:  . aspirin EC  81 mg Oral Daily  . atorvastatin  40 mg Oral q1800  . carvedilol  3.125 mg Oral BID WC  . docusate sodium  100 mg Oral BID  . enoxaparin (LOVENOX) injection  40 mg Subcutaneous Q24H  . furosemide  40 mg Intravenous Q8H  . lisinopril  20 mg Oral Daily  . omega-3 acid ethyl esters  1 capsule Oral BID  . pantoprazole  40 mg Oral Daily  . PARoxetine  30 mg Oral Daily  . potassium chloride  40 mEq Oral TID  . prazosin  1 mg Oral QHS  . QUEtiapine  300 mg Oral QHS  . sodium chloride flush  3 mL Intravenous Q12H  . sodium chloride flush  3 mL Intravenous Q12H  . spironolactone  25 mg Oral Daily   Infusions:    Labs:  Recent Labs  06/02/16 0640 06/03/16 0941 06/04/16 0459  NA 140 138 136  K 4.6 4.8 4.5  CL 102 101 101  CO2 29 29 29   GLUCOSE 154* 176* 149*  BUN 23* 23* 22*  CREATININE 1.08* 1.23* 1.32*  CALCIUM 9.1 9.5  9.4  MG 2.2  --  2.0   No results for input(s): AST, ALT, ALKPHOS, BILITOT, PROT, ALBUMIN in the last 72 hours. No results for input(s): WBC, NEUTROABS, HGB, HCT, MCV, PLT in the last 72 hours. No results for input(s): CKTOTAL, CKMB, TROPONINI in the last 72 hours. Invalid input(s): POCBNP No results for input(s): HGBA1C in the last 72 hours.   Weights: Filed Weights   06/02/16 0813 06/03/16 0512 06/04/16 0449  Weight: 160 lb 11.2 oz (72.9 kg) 153 lb 8 oz (69.6 kg) 150 lb 3.2 oz (68.1 kg)     Radiology/Studies:  Dg Chest 2  View  Result Date: 05/30/2016 CLINICAL DATA:  Acute onset of generalized abdominal pain and distention. Vomiting and diarrhea. Dark red blood noted in stool. Initial encounter. EXAM: CHEST  2 VIEW COMPARISON:  Chest radiograph performed 05/19/2016 FINDINGS: The lungs are well-aerated. Vascular congestion is noted. Increased interstitial markings raise concern for mild interstitial edema. There is no evidence of pleural effusion or pneumothorax. The heart is mildly enlarged. An AICD is noted at the left chest wall, with a single lead ending at the right ventricle. No acute osseous abnormalities are seen. IMPRESSION: Vascular congestion and mild cardiomegaly. Increased interstitial markings raise concern for mild interstitial edema. Electronically Signed   By: Roanna Raider M.D.   On: 05/30/2016 00:24   Dg Chest 2 View  Result Date: 05/19/2016 CLINICAL DATA:  Generalized body aches and chest pain. EXAM: CHEST  2 VIEW COMPARISON:  April 30, 2016 FINDINGS: Stable cardiomegaly and left AICD device. Mild atelectasis in the left mid lung. No other interval changes or acute abnormalities. IMPRESSION: No active cardiopulmonary disease. Electronically Signed   By: Gerome Sam III M.D   On: 05/19/2016 23:47   Ct Abdomen Pelvis W Contrast  Result Date: 05/30/2016 CLINICAL DATA:  50 year old female with generalized abdominal pain, vomiting, and diarrhea. Jaundice. EXAM: CT ABDOMEN AND PELVIS WITH CONTRAST TECHNIQUE: Multidetector CT imaging of the abdomen and pelvis was performed using the standard protocol following bolus administration of intravenous contrast. CONTRAST:  ISOVUE-300 IOPAMIDOL (ISOVUE-300) INJECTION 61% COMPARISON:  Abdominal CT dated 05/14/2016 FINDINGS: Lower chest: Small right pleural effusion. Left lung base linear atelectasis/scarring. The visualized lung bases are otherwise clear. There is cardiomegaly with biatrial enlargement. A pacemaker wires partially visualized. There is  retrograde flow of contrast from the right atrium into the IVC compatible with a degree of right cardiac dysfunction. Correlation with echocardiogram recommended. No intra-abdominal free air.  Small ascites. Hepatobiliary: Heterogeneous enhancement of the liver with nutmeg appearance most compatible with congestive hepatopathy. There is mild irregularity of the hepatic contour which may be related to underlying cirrhosis. Correlation with clinical exam recommended. No intrahepatic biliary ductal dilatation. No gallstone. Pancreas: Unremarkable. No pancreatic ductal dilatation or surrounding inflammatory changes. Spleen: Normal in size without focal abnormality. Adrenals/Urinary Tract: Adrenal glands are unremarkable. Kidneys are normal, without renal calculi, focal lesion, or hydronephrosis. Bladder is unremarkable. Stomach/Bowel: Oral contrast noted within the stomach and opacifying multiple loops of small bowel and traversing into the colon without evidence of bowel obstruction. Mildly thickened loops of small bowel may be related to underdistention or hepatic enteropathy. Enteritis is less likely but not entirely excluded. Clinical correlation is recommended. Normal appendix. Vascular/Lymphatic: The abdominal aorta appears unremarkable. The origins of the celiac axis, SMA, IMA as well as the origins of the renal arteries are patent. No portal venous gas identified. There is no adenopathy. Reproductive: Hysterectomy. Other: There is diffuse subcutaneous  edema and anasarca. Musculoskeletal: Multilevel degenerative changes of the spine most prominent at L4-L5 where there is endplate irregularity and sclerosis. No acute fracture. IMPRESSION: Cardiomegaly with evidence of right cardiac dysfunction. Correlation with echocardiogram recommended. Congestive hepatopathy with possible mild underlying cirrhosis. Correlation with clinical exam and liver function tests recommended. Ultrasound may provide better evaluation of  the liver contour. Small right pleural effusion, small ascites, and anasarca. Segmental thickening of the small bowel in the mid abdomen may be related to underdistention or hepatic enteropathy. Enteritis is not excluded. Clinical correlation is recommended. No bowel obstruction. Normal appendix. Electronically Signed   By: Elgie Collard M.D.   On: 05/30/2016 06:06   Ct Abdomen Pelvis W Contrast  Result Date: 05/14/2016 CLINICAL DATA:  Nausea,  vomiting and diarrhea for 1 month. EXAM: CT ABDOMEN AND PELVIS WITH CONTRAST TECHNIQUE: Multidetector CT imaging of the abdomen and pelvis was performed using the standard protocol following bolus administration of intravenous contrast. CONTRAST:  ISOVUE-300 IOPAMIDOL (ISOVUE-300) INJECTION 61% COMPARISON:  None. FINDINGS: Lower chest: The lung bases are clear. Hepatobiliary: Hepatic steatosis noted. There is reflux of contrast material from the right atrium into the hepatic veins compatible with passive venous congestion. No focal liver abnormalities. Diffuse gallbladder wall edema is identified. No biliary dilatation noted. Pancreas: Unremarkable. No pancreatic ductal dilatation or surrounding inflammatory changes. Spleen: Normal in size without focal abnormality. Adrenals/Urinary Tract: Adrenal glands are unremarkable. Kidneys are normal, without renal calculi, focal lesion, or hydronephrosis. Bladder is unremarkable. Stomach/Bowel: Stomach is within normal limits. Appendix appears normal. No evidence of bowel wall thickening, distention, or inflammatory changes. Vascular/Lymphatic: Calcified atherosclerotic disease involves the abdominal aorta. No aneurysm. No enlarged retroperitoneal or mesenteric adenopathy. No enlarged pelvic or inguinal lymph nodes. Reproductive: Status post hysterectomy. No adnexal masses. Other: There is moderate ascites within the abdomen and pelvis. Diffuse body wall edema is identified. Musculoskeletal: Degenerative disc disease is  identified at the L4-5 level. IMPRESSION: 1. Moderate abdominal ascites and body wall edema. 2. Reflux of contrast material into the IVC and hepatic veins compatible with passive venous congestion which may be seen with right heart failure. 3. Gallbladder wall edema is nonspecific in the setting of ascites and anasarca. 4. Aortic atherosclerosis 5. Lumbar degenerative disc disease. Electronically Signed   By: Signa Kell M.D.   On: 05/14/2016 10:59     Assessment and Plan  Principal Problem:   Acute on chronic systolic CHF (congestive heart failure) (HCC) Active Problems:   Pulmonary hypertension   Hypokalemia   NICM (nonischemic cardiomyopathy) (HCC)   Elevated troponin   Other ascites    1. Acute on chronic systolic CHF/NICM: -Much improved -Transition to PO Lasix today as it appears she has reached a euvolemic state -PO Lasix 40 mg bid with KCl repletion -Continue ASA, Coreg, lisinopril, and spironolactone  -Needs close follow up with [facility] and CHF clinic -Medication compliance advised -Documented weight decrease of 21 pounds this admission (171-->150 pounds)  Signed, Eula Listen, PA-C [facility] Pager: [phone] 06/04/2016, 7:34 AM

## 2016-06-04 NOTE — Progress Notes (Signed)
To ultrasound via bed 

## 2016-06-04 NOTE — Progress Notes (Signed)
Back from ultrasound, pt states not enough fluid in abdomen to proceed with Paracentesis.

## 2016-06-04 NOTE — Discharge Summary (Signed)
[facility] [place] [facility]   PATIENT NAME: Bonnie Ochoa    MR#:  [id number]  DATE OF BIRTH:  Aug 22, 1965  DATE OF ADMISSION:  05/30/2016   ADMITTING PHYSICIAN: Enedina Finner, MD  DATE OF DISCHARGE: 06/04/2016  PRIMARY CARE PHYSICIAN: No PCP Per Patient   ADMISSION DIAGNOSIS:   SOB Diff Breathing  DISCHARGE DIAGNOSIS:   Principal Problem:   Acute on chronic systolic CHF (congestive heart failure) (HCC) Active Problems:   Pulmonary hypertension   Hypokalemia   NICM (nonischemic cardiomyopathy) (HCC)   Elevated troponin   Other ascites   SECONDARY DIAGNOSIS:   Past Medical History:  Diagnosis Date  . Asthma   . Bipolar 1 disorder (HCC)   . CHF (congestive heart failure) (HCC)   . Coronary artery disease   . Depression   . Diabetes mellitus without complication (HCC)   . Hypertension     HOSPITAL COURSE:   50 year old female with past medical history significant for chronic systolic CHF, nonischemic cardiomyopathy, pulmonary hypertension who has been noncompliant with her medication presents to the hospital secondary to worsening shortness of breath.  #1 acute on chronic systolic CHF exacerbation-noncompliant with her medications. Started on IV Lasix in the hospital. -Remains net negative by almost 9 L since admission and dropped her weight by more than 20 pounds. -Breathing is much improved. Appreciate cardiology consult. -Discussed about low sodium diet, fluid restriction and being compliant with her medications. -Being discharged on oral Lasix, Aldactone, Coreg, lisinopril -Outpatient follow-up recommended. -ECHO: With EF less than 20%  #2 hypertension-continue lisinopril, Lasix, Aldactone and Coreg  #3 liver cirrhosis-generalized abdominal pain. -Not enough ascites fluid for Paracentesis today. -Continue to monitor as outpatient. Has seen GI prior to admission - continue lasix -Tramadol as needed for abdominal pain  #4  depression-on Seroquel  Anticipate discharge today and outpatient follow-up recommended  DISCHARGE CONDITIONS:   Guarded  CONSULTS OBTAINED:   Treatment Team:  Yvonne Kendall, MD Enid Baas, MD  DRUG ALLERGIES:   No Known Allergies DISCHARGE MEDICATIONS:     Medication List    STOP taking these medications   metoprolol succinate 25 MG 24 hr tablet Commonly known as:  TOPROL-XL   potassium chloride SA 20 MEQ tablet Commonly known as:  KLOR-CON M20     TAKE these medications   aspirin EC 81 MG tablet Take 81 mg by mouth daily.   atorvastatin 40 MG tablet Commonly known as:  LIPITOR Take 40 mg by mouth daily.   carvedilol 6.25 MG tablet Commonly known as:  COREG Take 1 tablet (6.25 mg total) by mouth 2 (two) times daily with a meal.   furosemide 40 MG tablet Commonly known as:  LASIX Take 1 tablet (40 mg total) by mouth 2 (two) times daily.   lisinopril 20 MG tablet Commonly known as:  PRINIVIL,ZESTRIL Take 20 mg by mouth daily.   nitroGLYCERIN 0.4 MG SL tablet Commonly known as:  NITROSTAT Place 1 tablet (0.4 mg total) under the tongue every 5 (five) minutes as needed for chest pain.   omega-3 acid ethyl esters 1 g capsule Commonly known as:  LOVAZA Take 1 capsule by mouth 2 (two) times daily.   ondansetron 4 MG tablet Commonly known as:  ZOFRAN Take 1 tablet (4 mg total) by mouth daily as needed for nausea or vomiting.   pantoprazole 40 MG tablet Commonly known as:  PROTONIX Take 1 tablet (40 mg total) by mouth daily.   PARoxetine 30 MG  tablet Commonly known as:  PAXIL Take 30 mg by mouth daily.   potassium chloride 20 MEQ packet Commonly known as:  KLOR-CON Take 20 mEq by mouth 2 (two) times daily.   prazosin 1 MG capsule Commonly known as:  MINIPRESS Take 1 mg by mouth at bedtime.   QUEtiapine 300 MG tablet Commonly known as:  SEROQUEL Take 300 mg by mouth at bedtime.   spironolactone 25 MG tablet Commonly known as:   ALDACTONE Take 1 tablet (25 mg total) by mouth daily.   traMADol 50 MG tablet Commonly known as:  ULTRAM Take 1 tablet (50 mg total) by mouth every 8 (eight) hours as needed for moderate pain or severe pain.        DISCHARGE INSTRUCTIONS:   1. PCP f/u in 1-2 weeks 2. Cardiology f/u in 2 weeks 3. CHF clinic follow up recommended  DIET:   Cardiac diet  ACTIVITY:   Activity as tolerated  OXYGEN:   Home Oxygen: No.  Oxygen Delivery: room air  DISCHARGE LOCATION:   home   If you experience worsening of your admission symptoms, develop shortness of breath, life threatening emergency, suicidal or homicidal thoughts you must seek medical attention immediately by calling 911 or calling your MD immediately  if symptoms less severe.  You Must read complete instructions/literature along with all the possible adverse reactions/side effects for all the Medicines you take and that have been prescribed to you. Take any new Medicines after you have completely understood and accpet all the possible adverse reactions/side effects.   Please note  You were cared for by a hospitalist during your hospital stay. If you have any questions about your discharge medications or the care you received while you were in the hospital after you are discharged, you can call the unit and asked to speak with the hospitalist on call if the hospitalist that took care of you is not available. Once you are discharged, your primary care physician will handle any further medical issues. Please note that NO REFILLS for any discharge medications will be authorized once you are discharged, as it is imperative that you return to your primary care physician (or establish a relationship with a primary care physician if you do not have one) for your aftercare needs so that they can reassess your need for medications and monitor your lab values.    On the day of Discharge:  VITAL SIGNS:   Blood pressure 108/81, pulse  79, temperature 97.8 F (36.6 C), temperature source Oral, resp. rate 18, height 5\' 5"  (1.651 m), weight 68.1 kg (150 lb 3.2 oz), SpO2 97 %.  PHYSICAL EXAMINATION:    GENERAL:  50 y.o.-year-old patient lying in the bed with no acute distress.  EYES: Pupils equal, round, reactive to light and accommodation. No scleral icterus. Extraocular muscles intact.  HEENT: Head atraumatic, normocephalic. Oropharynx and nasopharynx clear.  NECK:  Supple, no jugular venous distention. No thyroid enlargement, no tenderness.  LUNGS: Normal breath sounds bilaterally, no wheezing, rales,rhonchi or crepitation. No use of accessory muscles of respiration.  CARDIOVASCULAR: S1, S2 normal. No murmurs, rubs, or gallops.  ABDOMEN: Soft, non-tender, distended. Minimal discomfort on palpation. Bowel sounds present. No organomegaly or mass.  EXTREMITIES: No pedal edema, cyanosis, or clubbing.  NEUROLOGIC: Cranial nerves II through XII are intact. Muscle strength 5/5 in all extremities. Sensation intact. Gait not checked.  PSYCHIATRIC: The patient is alert and oriented x 3.  SKIN: No obvious rash, lesion, or ulcer.   DATA  REVIEW:   CBC  Recent Labs Lab 05/29/16 2354  WBC 6.5  HGB 12.0  HCT 37.6  PLT 218    Chemistries   Recent Labs Lab 05/29/16 2354  06/04/16 0459  NA 141  < > 136  K 2.8*  < > 4.5  CL 104  < > 101  CO2 26  < > 29  GLUCOSE 150*  < > 149*  BUN 16  < > 22*  CREATININE 1.00  < > 1.32*  CALCIUM 9.0  < > 9.4  MG  --   < > 2.0  AST 28  --   --   ALT 82*  --   --   ALKPHOS 79  --   --   BILITOT 4.6*  --   --   < > = values in this interval not displayed.   Microbiology Results  No results found for this or any previous visit.  RADIOLOGY:  US Abdomen Limited  Result Date: 06/04/2016 CLINICAL DATA:  Evaluate for ascites. EXAM: LIMITED ABDOMEN ULTRASOUND FOR ASCITES TECHNIQUE: Limited ultrasound survey for ascites was performed in all four abdominal quadrants. COMPARISON:  CT of  the abdomen and pelvis - 05/30/2016 FINDINGS: Multiple grayscale images are provided of the 4 quadrants of the abdomen only demonstrating an trace amount of perihepatic ascites (image 2), decreased in amount compared to the 05/30/2016 abdominal CT. IMPRESSION: Interval reduction in now trace amount of residual perihepatic ascites, not enough to allow for safe ultrasound-guided paracentesis. Electronically Signed   By: Simonne Come M.D.   On: 06/04/2016 13:45     Management plans discussed with the patient, family and they are in agreement.  CODE STATUS:     Code Status Orders        Start     Ordered   05/30/16 1130  Full code  Continuous     05/30/16 1129    Code Status History    Date Active Date Inactive Code Status Order ID Comments User Context   This patient has a current code status but no historical code status.      TOTAL TIME TAKING CARE OF THIS PATIENT: 37 minutes.    Stryder Poitra M.D on 06/04/2016 at 3:10 PM  Between 7am to 6pm - Pager - [phone]  After 6pm go to [email or web address] - Scientist, research (life sciences) [facility]  Office  [phone]  CC: Primary care physician; No PCP Per Patient   Note: This dictation was prepared with Dragon dictation along with smaller phrase technology. Any transcriptional errors that result from this process are unintentional.

## 2016-06-12 ENCOUNTER — Ambulatory Visit (INDEPENDENT_AMBULATORY_CARE_PROVIDER_SITE_OTHER): Payer: Self-pay | Admitting: Internal Medicine

## 2016-06-12 ENCOUNTER — Encounter: Payer: Self-pay | Admitting: Internal Medicine

## 2016-06-12 VITALS — BP 122/98 | HR 86 | Ht 65.0 in | Wt 147.8 lb

## 2016-06-12 DIAGNOSIS — Z95810 Presence of automatic (implantable) cardiac defibrillator: Secondary | ICD-10-CM

## 2016-06-12 DIAGNOSIS — I509 Heart failure, unspecified: Secondary | ICD-10-CM

## 2016-06-12 DIAGNOSIS — I420 Dilated cardiomyopathy: Secondary | ICD-10-CM

## 2016-06-12 LAB — CUP PACEART INCLINIC DEVICE CHECK
Date Time Interrogation Session: 20171031140226
HighPow Impedance: 47.25 Ohm
Implantable Lead Implant Date: 20160410
Implantable Pulse Generator Implant Date: 20160410
Lead Channel Impedance Value: 400 Ohm
Lead Channel Pacing Threshold Amplitude: 1 V
Lead Channel Pacing Threshold Pulse Width: 0.5 ms
Lead Channel Sensing Intrinsic Amplitude: 12 mV
Lead Channel Setting Sensing Sensitivity: 0.5 mV
MDC IDC LEAD LOCATION: 753860
MDC IDC MSMT BATTERY REMAINING LONGEVITY: 99 mo
MDC IDC PG SERIAL: 7259964
MDC IDC SET LEADCHNL RV PACING AMPLITUDE: 2.5 V
MDC IDC SET LEADCHNL RV PACING PULSEWIDTH: 0.5 ms
MDC IDC STAT BRADY RV PERCENT PACED: 0.01 %

## 2016-06-12 NOTE — Progress Notes (Signed)
ELECTROPHYSIOLOGY CONSULT NOTE  Patient ID: Bonnie Ochoa, MRN: [id number], DOB/AGE: 52-18-1967 52 y.o. Admit date: (Not on file) Date of Consult: 06/12/2016  Primary Physician: No PCP Per Patient Primary Cardiologist: TG Consulting Physician TG  Chief Complaint: ICD to establish   HPI Bonnie Ochoa is a 50 y.o. female  With presumed nonischemic cardiomyopathy status post ICD implantation 2/16 for primary prevention.    She was recently hospitalized with congestive heart failure in the context of having run out of her medications. These records were reviewed.  Ejection fraction at that consultation was 20%.  More remotely she apparently had a catheterization demonstrating no coronary disease.  Recent CT demonstrated no coronary artery calcifications  She is much improved functionally. She is able to walk now. She is lying flat. She has no edema.  The mechanism of her cardiomyopathy is not clear. She has a remote history of alcohol. She has a history of hypertension. She has 5 half2 siblings (shared mother-no connection with her biological father) and 2 children. None has been evaluated for cardiomyopathy. Her mother died with ischemic heart disease (inferred from multiple surgeries.)   Past Medical History:  Diagnosis Date  . Asthma   . Bipolar 1 disorder (HCC)   . CHF (congestive heart failure) (HCC)   . Coronary artery disease   . Depression   . Diabetes mellitus without complication (HCC)   . Hypertension       Surgical History:  Past Surgical History:  Procedure Laterality Date  . ABDOMINAL HYSTERECTOMY    . debribalator  2016  . hysterectmy  2011  . INSERT / REPLACE / REMOVE PACEMAKER    . INSERTION OF ICD    . TONSILECTOMY, ADENOIDECTOMY, BILATERAL MYRINGOTOMY AND TUBES    . TUBAL LIGATION  1980     Home Meds: Prior to Admission medications   Medication Sig Start Date End Date Taking? Authorizing Provider  aspirin EC 81 MG tablet Take 1 tablet (81 mg  total) by mouth daily. 06/04/16  Yes Enid Baas, MD  atorvastatin (LIPITOR) 40 MG tablet Take 1 tablet (40 mg total) by mouth daily. 06/04/16  Yes Enid Baas, MD  carvedilol (COREG) 6.25 MG tablet Take 1 tablet (6.25 mg total) by mouth 2 (two) times daily with a meal. 06/04/16  Yes Enid Baas, MD  furosemide (LASIX) 40 MG tablet Take 1 tablet (40 mg total) by mouth 2 (two) times daily. 06/04/16  Yes Enid Baas, MD  lisinopril (PRINIVIL,ZESTRIL) 20 MG tablet Take 1 tablet (20 mg total) by mouth daily. 06/04/16  Yes Enid Baas, MD  nitroGLYCERIN (NITROSTAT) 0.4 MG SL tablet Place 1 tablet (0.4 mg total) under the tongue every 5 (five) minutes as needed for chest pain. 06/04/16  Yes Enid Baas, MD  omega-3 acid ethyl esters (LOVAZA) 1 g capsule Take 1 capsule by mouth 2 (two) times daily.   Yes Historical Provider, MD  ondansetron (ZOFRAN) 4 MG tablet Take 1 tablet (4 mg total) by mouth daily as needed for nausea or vomiting. 04/30/16  Yes Emily Filbert, MD  pantoprazole (PROTONIX) 40 MG tablet Take 1 tablet (40 mg total) by mouth daily. 05/24/16 05/24/17 Yes Darren Servando Snare, MD  PARoxetine (PAXIL) 30 MG tablet Take 1 tablet (30 mg total) by mouth daily. 06/04/16  Yes Enid Baas, MD  potassium chloride (KLOR-CON) 20 MEQ packet Take 20 mEq by mouth 2 (two) times daily. 06/04/16  Yes Enid Baas, MD  prazosin (MINIPRESS) 1 MG capsule Take 1  mg by mouth at bedtime.   Yes Historical Provider, MD  QUEtiapine (SEROQUEL) 300 MG tablet Take 1 tablet (300 mg total) by mouth at bedtime. 06/04/16  Yes Enid Baas, MD  spironolactone (ALDACTONE) 25 MG tablet Take 1 tablet (25 mg total) by mouth daily. 06/04/16  Yes Enid Baas, MD  traMADol (ULTRAM) 50 MG tablet Take 1 tablet (50 mg total) by mouth every 8 (eight) hours as needed for moderate pain or severe pain. 06/04/16  Yes Enid Baas, MD    Allergies: No Known Allergies  Social  History   Social History  . Marital status: Single    Spouse name: N/A  . Number of children: N/A  . Years of education: N/A   Occupational History  . Not on file.   Social History Main Topics  . Smoking status: Former Games developer  . Smokeless tobacco: Never Used  . Alcohol use No  . Drug use:     Types: Marijuana     Comment: last smoked marijuana earlier today  . Sexual activity: No   Other Topics Concern  . Not on file   Social History Narrative  . No narrative on file     Family History  Problem Relation Age of Onset  . Hypertension Mother   . Hyperlipidemia Mother      ROS:  Please see the history of present illness.     All other systems reviewed and negative.    Physical Exam: Blood pressure (!) 122/98, pulse 86, height 5\' 5"  (1.651 m), weight 147 lb 12.8 oz (67 kg). General: Well developed, well nourished female in no acute distress. Head: Normocephalic, atraumatic, sclera non-icteric, no xanthomas, nares are without discharge. EENT: normal  Lymph Nodes:  none Neck: Negative for carotid bruits. JVD not elevated. Back:without scoliosis kyphosis Lungs: Clear bilaterally to auscultation without wheezes, rales, or rhonchi. Breathing is unlabored. Heart: RRR with S1 S2. 2/6 systolic  murmur . No rubs, or gallops appreciated. Abdomen: Soft, non-tender, non-distended with normoactive bowel sounds. No hepatomegaly. No rebound/guarding. No obvious abdominal masses. Msk:  Strength and tone appear normal for age. Extremities: No clubbing or cyanosis. No + edema.  Distal pedal pulses are 2+ and equal bilaterally. Skin: Warm and Dry Neuro: Alert and oriented X 3. CN III-XII intact Grossly normal sensory and motor function . Psych:  Responds to questions appropriately with a normal affect.      Labs: Cardiac Enzymes No results for input(s): CKTOTAL, CKMB, TROPONINI in the last 72 hours. CBC Lab Results  Component Value Date   WBC 6.5 05/29/2016   HGB 12.0 05/29/2016    HCT 37.6 05/29/2016   MCV 92.0 05/29/2016   PLT 218 05/29/2016   PROTIME: No results for input(s): LABPROT, INR in the last 72 hours. Chemistry No results for input(s): NA, K, CL, CO2, BUN, CREATININE, CALCIUM, PROT, BILITOT, ALKPHOS, ALT, AST, GLUCOSE in the last 168 hours.  Invalid input(s): LABALBU Lipids No results found for: CHOL, HDL, LDLCALC, TRIG BNP No results found for: PROBNP Thyroid Function Tests: No results for input(s): TSH, T4TOTAL, T3FREE, THYROIDAB in the last 72 hours.  Invalid input(s): FREET3 Miscellaneous No results found for: DDIMER  Radiology/Studies:  Dg Chest 2 View  Result Date: 05/30/2016 CLINICAL DATA:  Acute onset of generalized abdominal pain and distention. Vomiting and diarrhea. Dark red blood noted in stool. Initial encounter. EXAM: CHEST  2 VIEW COMPARISON:  Chest radiograph performed 05/19/2016 FINDINGS: The lungs are well-aerated. Vascular congestion is noted. Increased interstitial markings  raise concern for mild interstitial edema. There is no evidence of pleural effusion or pneumothorax. The heart is mildly enlarged. An AICD is noted at the left chest wall, with a single lead ending at the right ventricle. No acute osseous abnormalities are seen. IMPRESSION: Vascular congestion and mild cardiomegaly. Increased interstitial markings raise concern for mild interstitial edema. Electronically Signed   By: Roanna RaiderJeffery  Chang M.D.   On: 05/30/2016 00:24   Dg Chest 2 View  Result Date: 05/19/2016 CLINICAL DATA:  Generalized body aches and chest pain. EXAM: CHEST  2 VIEW COMPARISON:  April 30, 2016 FINDINGS: Stable cardiomegaly and left AICD device. Mild atelectasis in the left mid lung. No other interval changes or acute abnormalities. IMPRESSION: No active cardiopulmonary disease. Electronically Signed   By: Gerome Samavid  Williams III M.D   On: 05/19/2016 23:47   Ct Abdomen Pelvis W Contrast  Result Date: 05/30/2016 CLINICAL DATA:  50 year old female  with generalized abdominal pain, vomiting, and diarrhea. Jaundice. EXAM: CT ABDOMEN AND PELVIS WITH CONTRAST TECHNIQUE: Multidetector CT imaging of the abdomen and pelvis was performed using the standard protocol following bolus administration of intravenous contrast. CONTRAST:  100mL ISOVUE-300 IOPAMIDOL (ISOVUE-300) INJECTION 61% COMPARISON:  Abdominal CT dated 05/14/2016 FINDINGS: Lower chest: Small right pleural effusion. Left lung base linear atelectasis/scarring. The visualized lung bases are otherwise clear. There is cardiomegaly with biatrial enlargement. A pacemaker wires partially visualized. There is retrograde flow of contrast from the right atrium into the IVC compatible with a degree of right cardiac dysfunction. Correlation with echocardiogram recommended. No intra-abdominal free air.  Small ascites. Hepatobiliary: Heterogeneous enhancement of the liver with nutmeg appearance most compatible with congestive hepatopathy. There is mild irregularity of the hepatic contour which may be related to underlying cirrhosis. Correlation with clinical exam recommended. No intrahepatic biliary ductal dilatation. No gallstone. Pancreas: Unremarkable. No pancreatic ductal dilatation or surrounding inflammatory changes. Spleen: Normal in size without focal abnormality. Adrenals/Urinary Tract: Adrenal glands are unremarkable. Kidneys are normal, without renal calculi, focal lesion, or hydronephrosis. Bladder is unremarkable. Stomach/Bowel: Oral contrast noted within the stomach and opacifying multiple loops of small bowel and traversing into the colon without evidence of bowel obstruction. Mildly thickened loops of small bowel may be related to underdistention or hepatic enteropathy. Enteritis is less likely but not entirely excluded. Clinical correlation is recommended. Normal appendix. Vascular/Lymphatic: The abdominal aorta appears unremarkable. The origins of the celiac axis, SMA, IMA as well as the origins of the  renal arteries are patent. No portal venous gas identified. There is no adenopathy. Reproductive: Hysterectomy. Other: There is diffuse subcutaneous edema and anasarca. Musculoskeletal: Multilevel degenerative changes of the spine most prominent at L4-L5 where there is endplate irregularity and sclerosis. No acute fracture. IMPRESSION: Cardiomegaly with evidence of right cardiac dysfunction. Correlation with echocardiogram recommended. Congestive hepatopathy with possible mild underlying cirrhosis. Correlation with clinical exam and liver function tests recommended. Ultrasound may provide better evaluation of the liver contour. Small right pleural effusion, small ascites, and anasarca. Segmental thickening of the small bowel in the mid abdomen may be related to underdistention or hepatic enteropathy. Enteritis is not excluded. Clinical correlation is recommended. No bowel obstruction. Normal appendix. Electronically Signed   By: Elgie CollardArash  Radparvar M.D.   On: 05/30/2016 06:06   Ct Abdomen Pelvis W Contrast  Result Date: 05/14/2016 CLINICAL DATA:  Nausea,  vomiting and diarrhea for 1 month. EXAM: CT ABDOMEN AND PELVIS WITH CONTRAST TECHNIQUE: Multidetector CT imaging of the abdomen and pelvis was performed using the standard  protocol following bolus administration of intravenous contrast. CONTRAST:  ISOVUE-300 IOPAMIDOL (ISOVUE-300) INJECTION 61% COMPARISON:  None. FINDINGS: Lower chest: The lung bases are clear. Hepatobiliary: Hepatic steatosis noted. There is reflux of contrast material from the right atrium into the hepatic veins compatible with passive venous congestion. No focal liver abnormalities. Diffuse gallbladder wall edema is identified. No biliary dilatation noted. Pancreas: Unremarkable. No pancreatic ductal dilatation or surrounding inflammatory changes. Spleen: Normal in size without focal abnormality. Adrenals/Urinary Tract: Adrenal glands are unremarkable. Kidneys are normal, without renal  calculi, focal lesion, or hydronephrosis. Bladder is unremarkable. Stomach/Bowel: Stomach is within normal limits. Appendix appears normal. No evidence of bowel wall thickening, distention, or inflammatory changes. Vascular/Lymphatic: Calcified atherosclerotic disease involves the abdominal aorta. No aneurysm. No enlarged retroperitoneal or mesenteric adenopathy. No enlarged pelvic or inguinal lymph nodes. Reproductive: Status post hysterectomy. No adnexal masses. Other: There is moderate ascites within the abdomen and pelvis. Diffuse body wall edema is identified. Musculoskeletal: Degenerative disc disease is identified at the L4-5 level. IMPRESSION: 1. Moderate abdominal ascites and body wall edema. 2. Reflux of contrast material into the IVC and hepatic veins compatible with passive venous congestion which may be seen with right heart failure. 3. Gallbladder wall edema is nonspecific in the setting of ascites and anasarca. 4. Aortic atherosclerosis 5. Lumbar degenerative disc disease. Electronically Signed   By: Signa Kell M.D.   On: 05/14/2016 10:59   US Abdomen Limited  Result Date: 06/04/2016 CLINICAL DATA:  Evaluate for ascites. EXAM: LIMITED ABDOMEN ULTRASOUND FOR ASCITES TECHNIQUE: Limited ultrasound survey for ascites was performed in all four abdominal quadrants. COMPARISON:  CT of the abdomen and pelvis - 05/30/2016 FINDINGS: Multiple grayscale images are provided of the 4 quadrants of the abdomen only demonstrating an trace amount of perihepatic ascites (image 2), decreased in amount compared to the 05/30/2016 abdominal CT. IMPRESSION: Interval reduction in now trace amount of residual perihepatic ascites, not enough to allow for safe ultrasound-guided paracentesis. Electronically Signed   By: Simonne Come M.D.   On: 06/04/2016 13:45    EKG: Sinus rhythm at 86 Intervals 15/09/39 Nonspecific ST-T changes   Assessment and Plan:  Nonischemic cardio myopathy  Implantable defibrillator-[employer]  Medical noncompliance  Congestive heart failure-chronic-systolic  High Risk Medication Surveillance    The patient has an implantable defibrillator. It was evaluated today;  instructions were given for device management.  We have reviewed the importance of medication compliance in her cardiomyopathy.  Euvolemic continue current meds  Last potassium level was 4.5  10/23  I have reviewed with her the importance of trying to understand the mechanism of her cardiomyopathy. She will talk with her children about having cardiac echocardiography as well as her sister who was a nurse to recheck onto her half siblings for their evaluation similarly.        Sherryl Manges

## 2016-06-12 NOTE — Patient Instructions (Addendum)
Medication Instructions: - Your physician recommends that you continue on your current medications as directed. Please refer to the Current Medication list given to you today.  Labwork: - none ordered  Procedures/Testing: - none ordered  Follow-Up: - Remote monitoring is used to monitor your Pacemaker of ICD from home. This monitoring reduces the number of office visits required to check your device to one time per year. It allows [place] to keep an eye on the functioning of your device to ensure it is working properly. You are scheduled for a device check from home on 09/11/16. You may send your transmission at any time that day. If you have a wireless device, the transmission will be sent automatically. After your physician reviews your transmission, you will receive a postcard with your next transmission date.  - Your physician wants you to follow-up in: 9 months with Dr. Graciela Husbands.. You will receive a reminder letter in the mail two months in advance. If you don't receive a letter, please call our office to schedule the follow-up appointment.  Any Additional Special Instructions Will Be Listed Below (If Applicable).     If you need a refill on your cardiac medications before your next appointment, please call your pharmacy.

## 2016-06-20 ENCOUNTER — Other Ambulatory Visit: Payer: Self-pay | Admitting: Gastroenterology

## 2016-06-28 ENCOUNTER — Encounter: Payer: Self-pay | Admitting: Cardiovascular Disease

## 2016-06-28 ENCOUNTER — Ambulatory Visit (INDEPENDENT_AMBULATORY_CARE_PROVIDER_SITE_OTHER): Payer: Medicare Other | Admitting: Cardiovascular Disease

## 2016-06-28 VITALS — BP 122/80 | HR 87 | Ht 65.0 in | Wt 148.0 lb

## 2016-06-28 DIAGNOSIS — I5023 Acute on chronic systolic (congestive) heart failure: Secondary | ICD-10-CM

## 2016-06-28 MED ORDER — SPIRONOLACTONE 25 MG PO TABS: 25.0000 mg | ORAL_TABLET | Freq: Every day | ORAL | 3 refills | Status: DC

## 2016-06-28 MED ORDER — SACUBITRIL-VALSARTAN 49-51 MG PO TABS: 1.0000 | ORAL_TABLET | Freq: Two times a day (BID) | ORAL | 3 refills | Status: DC

## 2016-06-28 NOTE — Patient Instructions (Addendum)
Medication Instructions:  Please stop lisinopril In 3 days' time, start entresto one pill twice a day  Please start spironolactone  Goal weight 142 at home  Labwork:  BMP today  Testing/Procedures:  No further testing at this time   I recommend watching educational videos on topics of interest to you at:       [email or web address]  Enter code: HEARTCARE    Follow-Up: It was a pleasure seeing you in the office today. Please call us if you have new issues that need to be addressed before your next appt.  [phone]  Your physician wants you to follow-up in: 1 month.    If you need a refill on your cardiac medications before your next appointment, please call your pharmacy.

## 2016-06-28 NOTE — Progress Notes (Signed)
Cardiology Office Note  Date:  06/28/2016   ID:  Bonnie Ochoa, DOB 1966/04/14, MRN [id number]  PCP:  No PCP Per Patient   Chief Complaint  Patient presents with  . other    1 month follow up. Meds reviewed by the pt. verbally.  Needs to discuss kidney issues that may be related to her heart, recently diagnosed with cirrhosis of the liver, has some shortness of breath and chest pain.     HPI:  Bonnie Ochoa is a pleasant 50 year old woman arrived from [place] In 2017 with notes in the computer detailing history of bipolar,  diabetes, hypertension, asthma History of heart failure, history of ICD, anemia Previous alcohol problem Nonischemic cardiomyopathy (patient denies any coronary disease on prior cardiac catheterization) Details of the above are unavailable Presented by referral from Dr. Servando SnareWohl, GI. She was seen for ascites, cirrhosis Notes indicate a History of paracentesis Previously on pain medication at home, oxycodone History of anxiety  Seen in the emergency room 05/19/2016 for abdominal pain, chest pain BNP in the hospital 3700 Several visits to the hospital   recent hospitalization October 2017 for acute on chronic CHF Had 20 L diuresis  Presents today and reports that she feels much better, still not at her baseline but much improved. Typically puts swelling in her abdomen, reports it is close to normal. Much improved shortness of breath Still some very mild abdominal swelling She is taking Lasix 40 mg twice a day, carvedilol 6.25 mill grams twice a day, lisinopril 20 mg daily, has not started Aldactone  Girlfriend who presents with her today helps to her medications  EKG on today's visit shows no sinus rhythm with rate 87 bpm, ST and T wave abnormality anterolateral leads  Other past medical history reviewed CT scan consistent with moderate abdominal ascites, fatty liver Aortic atherosclerosis Used to drink alcohol, no longer drinks   PMH:   has a past medical  history of Asthma; Bipolar 1 disorder (HCC); CHF (congestive heart failure) (HCC); Cirrhosis of liver (HCC); Coronary artery disease; Depression; Diabetes mellitus without complication (HCC); and Hypertension.  PSH:    Past Surgical History:  Procedure Laterality Date  . ABDOMINAL HYSTERECTOMY    . debribalator  2016  . hysterectmy  2011  . INSERT / REPLACE / REMOVE PACEMAKER    . INSERTION OF ICD    . TONSILECTOMY, ADENOIDECTOMY, BILATERAL MYRINGOTOMY AND TUBES    . TUBAL LIGATION  1980    Current Outpatient Prescriptions  Medication Sig Dispense Refill  . aspirin EC 81 MG tablet Take 1 tablet (81 mg total) by mouth daily. 30 tablet 2  . atorvastatin (LIPITOR) 40 MG tablet Take 1 tablet (40 mg total) by mouth daily. 30 tablet 2  . carvedilol (COREG) 6.25 MG tablet Take 1 tablet (6.25 mg total) by mouth 2 (two) times daily with a meal. 60 tablet 2  . furosemide (LASIX) 40 MG tablet Take 1 tablet (40 mg total) by mouth 2 (two) times daily. 60 tablet 2  . lisinopril (PRINIVIL,ZESTRIL) 20 MG tablet Take 1 tablet (20 mg total) by mouth daily. 30 tablet 2  . nitroGLYCERIN (NITROSTAT) 0.4 MG SL tablet Place 1 tablet (0.4 mg total) under the tongue every 5 (five) minutes as needed for chest pain. 30 tablet 12  . omega-3 acid ethyl esters (LOVAZA) 1 g capsule Take 1 capsule by mouth 2 (two) times daily.    . ondansetron (ZOFRAN) 4 MG tablet Take 1 tablet (4 mg total) by mouth daily  as needed for nausea or vomiting. 20 tablet 1  . pantoprazole (PROTONIX) 40 MG tablet Take 1 tablet (40 mg total) by mouth daily. 30 tablet 5  . PARoxetine (PAXIL) 30 MG tablet Take 1 tablet (30 mg total) by mouth daily. 30 tablet 2  . prazosin (MINIPRESS) 1 MG capsule Take 1 mg by mouth at bedtime.    Marland Kitchen QUEtiapine (SEROQUEL) 300 MG tablet Take 1 tablet (300 mg total) by mouth at bedtime. 30 tablet 2  . spironolactone (ALDACTONE) 25 MG tablet Take 1 tablet (25 mg total) by mouth daily. 30 tablet 2  . traMADol (ULTRAM)  50 MG tablet Take 1 tablet (50 mg total) by mouth every 8 (eight) hours as needed for moderate pain or severe pain. 20 tablet 0   No current facility-administered medications for this visit.      Allergies:   Patient has no known allergies.   Social History:  The patient  reports that she has quit smoking. She has never used smokeless tobacco. She reports that she uses drugs, including Marijuana. She reports that she does not drink alcohol.   Family History:   family history includes Hyperlipidemia in her mother; Hypertension in her mother.    Review of Systems: Review of Systems  Constitutional: Positive for malaise/fatigue.  Respiratory: Positive for shortness of breath.   Cardiovascular: Negative.        Abdominal swelling  Gastrointestinal: Negative.   Musculoskeletal: Negative.   Neurological: Negative.   Psychiatric/Behavioral: Negative.   All other systems reviewed and are negative.    PHYSICAL EXAM: VS:  BP 122/80 (BP Location: Left Arm, Patient Position: Sitting, Cuff Size: Normal)   Pulse 87   Ht 5\' 5"  (1.651 m)   Wt 148 lb (67.1 kg)   BMI 24.63 kg/m  , BMI Body mass index is 24.63 kg/m. GEN: Well nourished, well developed, in no acute distress, obese  HEENT: normal  Neck: + JVD 12 cm, carotid bruits, or masses Cardiac: RRR; no murmurs, rubs, or gallops,no edema  Respiratory:  clear to auscultation bilaterally, normal work of breathing GI: soft, nontender, nondistended, + BS MS: no deformity or atrophy  Skin: warm and dry, no rash Neuro:  Strength and sensation are intact Psych: euthymic mood, full affect    Recent Labs: 05/29/2016: ALT 82; Hemoglobin 12.0; Platelets 218 05/30/2016: B Natriuretic Peptide >4,500.0 06/04/2016: BUN 22; Creatinine, Ser 1.32; Magnesium 2.0; Potassium 4.5; Sodium 136    Lipid Panel No results found for: CHOL, HDL, LDLCALC, TRIG    Wt Readings from Last 3 Encounters:  06/28/16 148 lb (67.1 kg)  06/12/16 147 lb 12.8 oz  (67 kg)  06/04/16 150 lb 3.2 oz (68.1 kg)       ASSESSMENT AND PLAN:  Congestive heart failure, unspecified congestive heart failure chronicity, unspecified congestive heart failure type (HCC) -  Recently seen by Dr. Graciela Husbands for her ICD  Recent hospitalization with significant diuresis  For some reason she is not taking potassium Recommended that she start her  Aldactone BMP drawn today  We will hold her lisinopril, start entresto 49/51 mg twice a day  Hypertension, unspecified type Medication changes as above   Congestive dilated cardiomyopathy (HCC)  nonischemic cardiomyopathy per patient and CT scan showing no coronary calcifications, no PAD Recent hospitalization High risk for readmission. Stressed importance of daily weights, taking extra Lasix for weight gain, moderate her fluid and salt intake  Other ascites Dramatic improvement in her abdominal swelling  She denies any further  alcohol  Obesity due to excess calories without serious comorbidity, unspecified classification  ICD in place Seen by Dr. Graciela Husbands   Total encounter time more than 25 minutes  Greater than 50% was spent in counseling and coordination of care with the patient  Disposition:   F/U  1  month    Signed, Dossie Arbour, M.D., Ph.D. 06/28/2016  [facility] [place], [place] [phone]

## 2016-06-29 ENCOUNTER — Encounter: Payer: Self-pay | Admitting: Cardiovascular Disease

## 2016-06-29 LAB — BASIC METABOLIC PANEL
BUN/Creatinine Ratio: 11 (ref 9–23)
BUN: 11 mg/dL (ref 6–24)
CALCIUM: 9.8 mg/dL (ref 8.7–10.2)
CHLORIDE: 98 mmol/L (ref 96–106)
CO2: 25 mmol/L (ref 18–29)
Creatinine, Ser: 0.99 mg/dL (ref 0.57–1.00)
GFR calc Af Amer: 76 mL/min/{1.73_m2} (ref >59)
GFR calc non Af Amer: 66 mL/min/{1.73_m2} (ref >59)
GLUCOSE: 131 mg/dL — AB (ref 65–99)
Potassium: 3.7 mmol/L (ref 3.5–5.2)
Sodium: 142 mmol/L (ref 134–144)

## 2016-06-29 NOTE — Addendum Note (Signed)
Addended by: Kendrick Fries on: 06/29/2016 04:40 PM   Modules accepted: Orders

## 2016-07-03 ENCOUNTER — Other Ambulatory Visit: Payer: Self-pay

## 2016-07-03 MED ORDER — POTASSIUM CHLORIDE ER 10 MEQ PO TBCR: 10.0000 meq | EXTENDED_RELEASE_TABLET | Freq: Every day | ORAL | 6 refills | Status: DC

## 2016-07-12 ENCOUNTER — Encounter: Payer: Self-pay | Admitting: Internal Medicine

## 2016-07-14 ENCOUNTER — Other Ambulatory Visit: Payer: Self-pay | Admitting: Gastroenterology

## 2016-07-20 ENCOUNTER — Encounter: Payer: Self-pay | Admitting: Cardiovascular Disease

## 2016-07-20 ENCOUNTER — Telehealth: Payer: Self-pay

## 2016-07-20 NOTE — Telephone Encounter (Signed)
Left message for pt to call back regarding MyChart message.  Message   My weight is 165 I am having trouble breathing my stomach has swell back up. My insurance is not excepted at [facility] so I have no pottasium and I'm out of the new estresha medicine. Do I need to go to the er or wait on my appointment. Thanks in advance.

## 2016-07-27 ENCOUNTER — Ambulatory Visit: Payer: Medicaid Other | Admitting: Cardiovascular Disease

## 2016-07-31 ENCOUNTER — Emergency Department
Admission: EM | Admit: 2016-07-31 | Discharge: 2016-07-31 | Disposition: A | Discharge status: Home / Self Care | Payer: Medicare Other | Attending: Emergency Medicine | Admitting: Emergency Medicine

## 2016-07-31 ENCOUNTER — Encounter: Payer: Self-pay | Admitting: Emergency Medicine

## 2016-07-31 ENCOUNTER — Emergency Department: Payer: Medicare Other

## 2016-07-31 DIAGNOSIS — E119 Type 2 diabetes mellitus without complications: Secondary | ICD-10-CM | POA: Insufficient documentation

## 2016-07-31 DIAGNOSIS — I2510 Atherosclerotic heart disease of native coronary artery without angina pectoris: Secondary | ICD-10-CM | POA: Diagnosis not present

## 2016-07-31 DIAGNOSIS — I509 Heart failure, unspecified: Secondary | ICD-10-CM | POA: Diagnosis not present

## 2016-07-31 DIAGNOSIS — I110 Hypertensive heart disease with heart failure: Secondary | ICD-10-CM | POA: Insufficient documentation

## 2016-07-31 DIAGNOSIS — Z87891 Personal history of nicotine dependence: Secondary | ICD-10-CM | POA: Diagnosis not present

## 2016-07-31 DIAGNOSIS — B349 Viral infection, unspecified: Secondary | ICD-10-CM

## 2016-07-31 DIAGNOSIS — Z79899 Other long term (current) drug therapy: Secondary | ICD-10-CM | POA: Diagnosis not present

## 2016-07-31 DIAGNOSIS — R0602 Shortness of breath: Secondary | ICD-10-CM | POA: Diagnosis present

## 2016-07-31 DIAGNOSIS — J45909 Unspecified asthma, uncomplicated: Secondary | ICD-10-CM | POA: Insufficient documentation

## 2016-07-31 LAB — URINALYSIS, COMPLETE (UACMP) WITH MICROSCOPIC
BILIRUBIN URINE: NEGATIVE
GLUCOSE, UA: NEGATIVE mg/dL
HGB URINE DIPSTICK: NEGATIVE
Ketones, ur: NEGATIVE mg/dL
LEUKOCYTES UA: NEGATIVE
NITRITE: NEGATIVE
Protein, ur: 30 mg/dL — AB
SPECIFIC GRAVITY, URINE: 1.012 (ref 1.005–1.030)
pH: 6 (ref 5.0–8.0)

## 2016-07-31 LAB — COMPREHENSIVE METABOLIC PANEL
ALBUMIN: 3.9 g/dL (ref 3.5–5.0)
ALT: 98 U/L — ABNORMAL HIGH (ref 14–54)
ANION GAP: 11 (ref 5–15)
AST: 88 U/L — AB (ref 15–41)
Alkaline Phosphatase: 107 U/L (ref 38–126)
BUN: 25 mg/dL — AB (ref 6–20)
CHLORIDE: 99 mmol/L — AB (ref 101–111)
CO2: 25 mmol/L (ref 22–32)
Calcium: 9.3 mg/dL (ref 8.9–10.3)
Creatinine, Ser: 1.06 mg/dL — ABNORMAL HIGH (ref 0.44–1.00)
GFR calc Af Amer: >60 mL/min (ref >60)
GFR calc non Af Amer: 59 mL/min — ABNORMAL LOW (ref >60)
GLUCOSE: 269 mg/dL — AB (ref 65–99)
POTASSIUM: 3 mmol/L — AB (ref 3.5–5.1)
SODIUM: 135 mmol/L (ref 135–145)
TOTAL PROTEIN: 8.1 g/dL (ref 6.5–8.1)
Total Bilirubin: 4.5 mg/dL — ABNORMAL HIGH (ref 0.3–1.2)

## 2016-07-31 LAB — CBC WITH DIFFERENTIAL/PLATELET
BASOS PCT: 1 %
Basophils Absolute: 0.1 10*3/uL (ref 0–0.1)
Eosinophils Absolute: 0.1 10*3/uL (ref 0–0.7)
Eosinophils Relative: 1 %
HEMATOCRIT: 31.1 % — AB (ref 35.0–47.0)
HEMOGLOBIN: 10.1 g/dL — AB (ref 12.0–16.0)
LYMPHS ABS: 1.3 10*3/uL (ref 1.0–3.6)
LYMPHS PCT: 21 %
MCH: 29.6 pg (ref 26.0–34.0)
MCHC: 32.5 g/dL (ref 32.0–36.0)
MCV: 91 fL (ref 80.0–100.0)
MONO ABS: 0.3 10*3/uL (ref 0.2–0.9)
MONOS PCT: 6 %
NEUTROS ABS: 4.3 10*3/uL (ref 1.4–6.5)
NEUTROS PCT: 71 %
Platelets: 234 10*3/uL (ref 150–440)
RBC: 3.41 MIL/uL — ABNORMAL LOW (ref 3.80–5.20)
RDW: 20.8 % — AB (ref 11.5–14.5)
WBC: 6.1 10*3/uL (ref 3.6–11.0)

## 2016-07-31 LAB — LIPASE, BLOOD: Lipase: 20 U/L (ref 11–51)

## 2016-07-31 LAB — TROPONIN I: Troponin I: 0.05 ng/mL (ref <0.03)

## 2016-07-31 LAB — BRAIN NATRIURETIC PEPTIDE: B Natriuretic Peptide: 2302 pg/mL — ABNORMAL HIGH (ref 0.0–100.0)

## 2016-07-31 LAB — INFLUENZA PANEL BY PCR (TYPE A & B)
INFLAPCR: NEGATIVE
INFLBPCR: NEGATIVE

## 2016-07-31 MED ORDER — KETOROLAC TROMETHAMINE 30 MG/ML IJ SOLN
15.0000 mg | Freq: Once | INTRAMUSCULAR | Status: AC
  Administered 2016-07-31: 15 mg via INTRAVENOUS
  Filled 2016-07-31: qty 1

## 2016-07-31 MED ORDER — POTASSIUM CHLORIDE CRYS ER 20 MEQ PO TBCR
40.0000 meq | EXTENDED_RELEASE_TABLET | Freq: Once | ORAL | Status: AC
  Administered 2016-07-31: 40 meq via ORAL
  Filled 2016-07-31: qty 2

## 2016-07-31 MED ORDER — FUROSEMIDE 10 MG/ML IJ SOLN
40.0000 mg | Freq: Once | INTRAMUSCULAR | Status: AC
  Administered 2016-07-31: 40 mg via INTRAVENOUS
  Filled 2016-07-31: qty 4

## 2016-07-31 NOTE — ED Notes (Signed)
AAOx3.  Skin warm and dry.  No SOB/ DOE.  NAD 

## 2016-07-31 NOTE — ED Provider Notes (Signed)
[facility] Emergency Department Provider Note  ____________________________________________  Time seen: Approximately 12:31 PM  I have reviewed the triage vital signs and the nursing notes.   HISTORY  Chief Complaint Cough    HPI Bonnie Ochoa is a 50 y.o. female , NAD, presents to emergency room with several day history of cough, shortness of breath, generalized body aches. Patient states she's had these symptoms in onset over the weekend. Has not been taking anything over-the-counter for such. Denies any fevers, chills or rigors. States the aches in her legs feels like "restless leg syndrome." Notes that she has had some chest tightness as well as generalized abdominal pain without distention. Denies any palpitations, nausea, vomiting or diarrhea. Has had no hemoptysis, hematochezia. Denies any changes in urinary patterns. Has had no swelling in her extremities.No known sick exposures. Notes that she received flu vaccine during her last hospital admission in October 2017.   Past Medical History:  Diagnosis Date  . Asthma   . Bipolar 1 disorder (HCC)   . CHF (congestive heart failure) (HCC)   . Cirrhosis of liver (HCC)   . Coronary artery disease   . Depression   . Diabetes mellitus without complication (HCC)   . Hypertension     Patient Active Problem List   Diagnosis Date Noted  . Acute on chronic systolic CHF (congestive heart failure) (HCC) 05/30/2016  . Pulmonary hypertension 05/30/2016  . Hypokalemia 05/30/2016  . NICM (nonischemic cardiomyopathy) (HCC) 05/30/2016  . Elevated troponin   . Other ascites   . Hypertension   . Diabetes mellitus without complication (HCC)   . Depression   . Coronary artery disease   . Bipolar 1 disorder (HCC)   . Asthma   . Generalized abdominal pain 05/07/2016    Past Surgical History:  Procedure Laterality Date  . ABDOMINAL HYSTERECTOMY    . debribalator  2016  . hysterectmy  2011  . INSERT / REPLACE /  REMOVE PACEMAKER    . INSERTION OF ICD    . TONSILECTOMY, ADENOIDECTOMY, BILATERAL MYRINGOTOMY AND TUBES    . TUBAL LIGATION  1980    Prior to Admission medications   Medication Sig Start Date End Date Taking? Authorizing Provider  aspirin EC 81 MG tablet Take 1 tablet (81 mg total) by mouth daily. 06/04/16   Enid Baas, MD  atorvastatin (LIPITOR) 40 MG tablet Take 1 tablet (40 mg total) by mouth daily. 06/04/16   Enid Baas, MD  carvedilol (COREG) 6.25 MG tablet Take 1 tablet (6.25 mg total) by mouth 2 (two) times daily with a meal. 06/04/16   Enid Baas, MD  furosemide (LASIX) 40 MG tablet Take 1 tablet (40 mg total) by mouth 2 (two) times daily. 06/04/16   Enid Baas, MD  lisinopril (PRINIVIL,ZESTRIL) 20 MG tablet Take 1 tablet (20 mg total) by mouth daily. 06/04/16   Enid Baas, MD  nitroGLYCERIN (NITROSTAT) 0.4 MG SL tablet Place 1 tablet (0.4 mg total) under the tongue every 5 (five) minutes as needed for chest pain. 06/04/16   Enid Baas, MD  omega-3 acid ethyl esters (LOVAZA) 1 g capsule Take 1 capsule by mouth 2 (two) times daily.    Historical Provider, MD  ondansetron (ZOFRAN) 4 MG tablet Take 1 tablet (4 mg total) by mouth daily as needed for nausea or vomiting. 04/30/16   Emily Filbert, MD  pantoprazole (PROTONIX) 40 MG tablet Take 1 tablet (40 mg total) by mouth daily. 05/24/16 05/24/17  Midge Minium, MD  PARoxetine (PAXIL) 30 MG tablet Take 1 tablet (30 mg total) by mouth daily. 06/04/16   Enid Baas, MD  potassium chloride (K-DUR) 10 MEQ tablet Take 1 tablet (10 mEq total) by mouth daily. 07/03/16 10/01/16  Antonieta Iba, MD  prazosin (MINIPRESS) 1 MG capsule Take 1 mg by mouth at bedtime.    Historical Provider, MD  QUEtiapine (SEROQUEL) 300 MG tablet Take 1 tablet (300 mg total) by mouth at bedtime. 06/04/16   Enid Baas, MD  sacubitril-valsartan (ENTRESTO) 49-51 MG Take 1 tablet by mouth 2 (two) times daily.  06/28/16   Antonieta Iba, MD  spironolactone (ALDACTONE) 25 MG tablet Take 1 tablet (25 mg total) by mouth daily. 06/28/16   Antonieta Iba, MD  traMADol (ULTRAM) 50 MG tablet Take 1 tablet (50 mg total) by mouth every 8 (eight) hours as needed for moderate pain or severe pain. 06/04/16   Enid Baas, MD    Allergies Patient has no known allergies.  Family History  Problem Relation Age of Onset  . Hypertension Mother   . Hyperlipidemia Mother     Social History Social History  Substance Use Topics  . Smoking status: Former Games developer  . Smokeless tobacco: Never Used  . Alcohol use No     Review of Systems  Constitutional: No fever/chills, Rigors Eyes: No visual changes. No discharge ENT: No sore throat, Nasal congestion, sinus pressure, ear pain. Cardiovascular: Positive chest pain but no palpitations. Respiratory: Positive cough, shortness of breath. No chest congestion, wheezing. Gastrointestinal: Positive abdominal pain.  No nausea, vomiting.  No diarrhea.  No constipation. Genitourinary: Negative for dysuria. No hematuria. No urinary hesitancy, urgency or increased frequency. Musculoskeletal: Positive generalized myalgias. Negative for back pain.  Skin: Negative for rash, redness or skin sores. Neurological: Negative for headaches, focal weakness or numbness. No numbness, weakness, tingling. No lightheadedness or dizziness. 10-point ROS otherwise negative.  ____________________________________________   PHYSICAL EXAM:  VITAL SIGNS: ED Triage Vitals [07/31/16 1119]  Enc Vitals Group     BP (!) 127/91     Pulse Rate 87     Resp 18     Temp 97.7 F (36.5 C)     Temp Source Oral     SpO2 99 %     Weight 153 lb (69.4 kg)     Height 5\' 5"  (1.651 m)     Head Circumference      Peak Flow      Pain Score 10     Pain Loc      Pain Edu?      Excl. in GC?      Constitutional: Alert and oriented. Well appearing and in no acute distress. Eyes: Conjunctivae  are normal without icterus or injection. Head: Atraumatic. ENT:      Nose: No congestion/rhinnorhea.      Mouth/Throat: Mucous membranes are moist. Pharynx without erythema, swelling, exudate. Uvula is midline. Airway is patent. Neck: No stridor. Supple with full range of motion. No cervical spine tenderness to palpation. Hematological/Lymphatic/Immunilogical: No cervical lymphadenopathy. Cardiovascular: Normal rate, regular rhythm. Normal S1 and S2.  Good peripheral circulation. Respiratory: Normal respiratory effort without tachypnea or retractions. Lungs CTAB with breath sounds noted in all lung fields. No wheeze, rhonchi, rales. Gastrointestinal: Generalized tenderness to palpation of the abdomen but all regions are soft without distention or guarding. No rigidity or rebound. Bowel sounds present normoactive in all quadrants. Musculoskeletal: No lower extremity tenderness nor edema.  No joint effusions. Full range of motion  of bilateral upper and lower shin is without pain or difficulty. Neurologic:  Normal speech and language. Normal gait and posture. No gross focal neurologic deficits are appreciated.  Skin:  Skin is warm, dry and intact. No rash or skin sores noted. Psychiatric: Mood and affect are normal. Speech and behavior are normal. Patient exhibits appropriate insight and judgement.   ____________________________________________   LABS (all labs ordered are listed, but only abnormal results are displayed)  Labs Reviewed  COMPREHENSIVE METABOLIC PANEL - Abnormal; Notable for the following:       Result Value   Potassium 3.0 (*)    Chloride 99 (*)    Glucose, Bld 269 (*)    BUN 25 (*)    Creatinine, Ser 1.06 (*)    AST 88 (*)    ALT 98 (*)    Total Bilirubin 4.5 (*)    GFR calc non Af Amer 59 (*)    All other components within normal limits  CBC WITH DIFFERENTIAL/PLATELET - Abnormal; Notable for the following:    RBC 3.41 (*)    Hemoglobin 10.1 (*)    HCT 31.1 (*)     RDW 20.8 (*)    All other components within normal limits  BRAIN NATRIURETIC PEPTIDE - Abnormal; Notable for the following:    B Natriuretic Peptide 2,302.0 (*)    All other components within normal limits  URINALYSIS, COMPLETE (UACMP) WITH MICROSCOPIC - Abnormal; Notable for the following:    Color, Urine YELLOW (*)    APPearance CLEAR (*)    Protein, ur 30 (*)    Bacteria, UA RARE (*)    Squamous Epithelial / LPF 0-5 (*)    All other components within normal limits  TROPONIN I - Abnormal; Notable for the following:    Troponin I 0.05 (*)    All other components within normal limits  LIPASE, BLOOD  INFLUENZA PANEL BY PCR (TYPE A & B, H1N1)   ____________________________________________  EKG  EKG reveals normal sinus rhythm with a ventricular rate of 90 bpm. No acute changes and no evidence of STEMI. EKG also reviewed by Dr. Chaney Malling. ____________________________________________  RADIOLOGY I, Hope Pigeon, personally viewed and evaluated these images (plain radiographs) as part of my medical decision making, as well as reviewing the written report by the radiologist.  Dg Chest 2 View  Result Date: 07/31/2016 CLINICAL DATA:  Body aches, cough, and shortness of breath. Duration of symptoms 1 week. History of asthma, cardiac dysrhythmia with defibrillator. CHF, cirrhosis. Former smoker. EXAM: CHEST  2 VIEW COMPARISON:  Chest x-ray of May 29, 2016 FINDINGS: The lungs are well-expanded. The interstitial markings are slightly more conspicuous bilaterally. There is no alveolar infiltrate or pleural effusion. The cardiac silhouette remains enlarged. The central pulmonary vascularity is slightly more conspicuous. The ICD is in stable position. There is mild multilevel degenerative disc disease of the thoracic spine. IMPRESSION: Low-grade CHF more conspicuous than on the previous study. No alveolar pneumonia. Electronically Signed   By: David  [place] M.D.   On: 07/31/2016 12:10     ____________________________________________    PROCEDURES  Procedure(s) performed: None   Procedures   Medications  furosemide (LASIX) injection 40 mg (40 mg Intravenous Given 07/31/16 1356)  potassium chloride SA (K-DUR,KLOR-CON) CR tablet 40 mEq (40 mEq Oral Given 07/31/16 1355)  ketorolac (TORADOL) 30 MG/ML injection 15 mg (15 mg Intravenous Given 07/31/16 1409)     ____________________________________________   INITIAL IMPRESSION / ASSESSMENT AND PLAN / ED COURSE  Pertinent labs & imaging results that were available during my care of the patient were reviewed by me and considered in my medical decision making (see chart for details).  Clinical Course as of Jul 31 1438  Tue Jul 31, 2016  1310 Patient visualized walking comfortably in the department.   [JH]  1348 I spoke with Dr. Dorothea GlassmanPaul Malinda in regards to the patient's presentation, history and imaging and laboratory results. He reviewed all imaging and lab results. Suggest giving the patient 40 mg Lasix IV. At this time patient has stable chronic anemia. Troponin and BNP were slightly elevated but much better than when last checked over the last couple of months. Chest x-ray does show mild CHF but again less prominent as compared to previous chest x-ray. Patient is established with a cardiologist and will be referred back to them for further evaluation and treatment. Patient will be highly encouraged [facility] and medication management.  [JH]    Clinical Course User Index [JH] Jami L Hagler, PA-C    Patient's diagnosis is consistent with viral syndrome and chronic CHF. All lab and imaging results were discussed with the patient. Patient's lab results and physical exam are reassuring with improvement as compared to previous results. She was able to ambulate in the emergency department without shortness of breath and remained stable without distress through her  ED course. Patient will be discharged home with instructions to [facility] and medication management. Patient is to follow up with Dr. Mariah MillingGollan in cardiology in 48 hours for recheck. Patient is given ED precautions to return to the ED for any worsening or new symptoms.    ____________________________________________  FINAL CLINICAL IMPRESSION(S) / ED DIAGNOSES  Final diagnoses:  Viral syndrome  Chronic congestive heart failure, unspecified congestive heart failure type (HCC)      NEW MEDICATIONS STARTED DURING THIS VISIT:  New Prescriptions   No medications on file         Hope PigeonJami L Hagler, PA-C 07/31/16 1440    Arnaldo NatalPaul F Malinda, MD 07/31/16 1550

## 2016-07-31 NOTE — ED Triage Notes (Signed)
C/o bodyaches, sob and cough.  NAD

## 2016-08-21 ENCOUNTER — Ambulatory Visit (INDEPENDENT_AMBULATORY_CARE_PROVIDER_SITE_OTHER): Payer: Medicare Other | Admitting: Cardiovascular Disease

## 2016-08-21 ENCOUNTER — Encounter: Payer: Self-pay | Admitting: Cardiovascular Disease

## 2016-08-21 VITALS — BP 118/98 | HR 83 | Ht 65.0 in | Wt 155.5 lb

## 2016-08-21 DIAGNOSIS — I10 Essential (primary) hypertension: Secondary | ICD-10-CM | POA: Diagnosis not present

## 2016-08-21 DIAGNOSIS — I428 Other cardiomyopathies: Secondary | ICD-10-CM | POA: Diagnosis not present

## 2016-08-21 DIAGNOSIS — E876 Hypokalemia: Secondary | ICD-10-CM

## 2016-08-21 DIAGNOSIS — E119 Type 2 diabetes mellitus without complications: Secondary | ICD-10-CM | POA: Diagnosis not present

## 2016-08-21 MED ORDER — SPIRONOLACTONE 25 MG PO TABS: 25.0000 mg | ORAL_TABLET | Freq: Every day | ORAL | 3 refills | Status: DC

## 2016-08-21 MED ORDER — ATORVASTATIN CALCIUM 40 MG PO TABS: 40.0000 mg | ORAL_TABLET | Freq: Every day | ORAL | 3 refills | Status: DC

## 2016-08-21 MED ORDER — FUROSEMIDE 40 MG PO TABS: 60.0000 mg | ORAL_TABLET | Freq: Two times a day (BID) | ORAL | 3 refills | Status: DC

## 2016-08-21 MED ORDER — POTASSIUM CHLORIDE ER 10 MEQ PO TBCR: 10.0000 meq | EXTENDED_RELEASE_TABLET | Freq: Two times a day (BID) | ORAL | 3 refills | Status: DC

## 2016-08-21 MED ORDER — FUROSEMIDE 40 MG PO TABS: 40.0000 mg | ORAL_TABLET | Freq: Two times a day (BID) | ORAL | 3 refills | Status: DC

## 2016-08-21 MED ORDER — CARVEDILOL 6.25 MG PO TABS: 6.2500 mg | ORAL_TABLET | Freq: Two times a day (BID) | ORAL | 3 refills | Status: DC

## 2016-08-21 NOTE — Progress Notes (Signed)
Cardiology Office Note  Date:  08/21/2016   ID:  Bonnie Ochoa, DOB 12/12/1965, MRN [id number]  PCP:  No PCP Per Patient   Chief Complaint  Patient presents with  . other    1 month f/u. Pt in ED 12/19 for viral syndrome/CHF. Pt c/o chest pain and sob. Pt states she has restless leg syndrome that gives her trouble sleeping. Reviewed meds with pt verbally.    HPI:  Bonnie Ochoa is a pleasant 51 year old woman arrived from [place] In 2017 with notes in the computer detailing history of bipolar,  diabetes, hypertension, asthma, Obstructive sleep apnea, restless leg syndrome Nonischemic cardiomyopathy,  history of ICD, anemia Previous alcohol problem (patient denies any coronary disease on prior cardiac catheterization) Previously referred by  Dr. Servando Snare, GI,  seen for ascites, cirrhosis Notes indicate a History of paracentesis Previously on pain medication at home, oxycodone History of anxiety She presents today for follow-up of her chronic systolic CHF, ejection fraction 25%  Seen in the emergency room 05/19/2016 for abdominal pain, chest pain BNP in the hospital 3700  acute on chronic CHF, Had 20 L diuresis  Since her last clinic visit she has been to the emergency room in December 2017 for nausea, vomiting, URI symptoms  She is taking Lasix 40 mg twice a day, only medication she is not taking is potassium Medicaid did not cover her entresto  she reports that she felt much better on the entresto  back on lisinopril 20 mg daily   hard to determine weight as she is wearing heavy winter boots today  Continues to have abdominal swelling, discomfort   Girlfriend who presents with her today helps to her medications  EKG on today's visit shows no sinus rhythm with rate 83 bpm, ST and T wave abnormality anterolateral leads  Other past medical history reviewed CT scan consistent with moderate abdominal ascites, fatty liver Aortic atherosclerosis Used to drink alcohol, no longer  drinks   PMH:   has a past medical history of Asthma; Bipolar 1 disorder (HCC); CHF (congestive heart failure) (HCC); Cirrhosis of liver (HCC); Coronary artery disease; Depression; Diabetes mellitus without complication (HCC); and Hypertension.  PSH:    Past Surgical History:  Procedure Laterality Date  . ABDOMINAL HYSTERECTOMY    . debribalator  2016  . hysterectmy  2011  . INSERT / REPLACE / REMOVE PACEMAKER    . INSERTION OF ICD    . TONSILECTOMY, ADENOIDECTOMY, BILATERAL MYRINGOTOMY AND TUBES    . TUBAL LIGATION  1980    Current Outpatient Prescriptions  Medication Sig Dispense Refill  . aspirin EC 81 MG tablet Take 1 tablet (81 mg total) by mouth daily. 30 tablet 2  . atorvastatin (LIPITOR) 40 MG tablet Take 1 tablet (40 mg total) by mouth daily. 30 tablet 2  . carvedilol (COREG) 6.25 MG tablet Take 1 tablet (6.25 mg total) by mouth 2 (two) times daily with a meal. 60 tablet 2  . furosemide (LASIX) 40 MG tablet Take 1 tablet (40 mg total) by mouth 2 (two) times daily. 60 tablet 2  . lisinopril (PRINIVIL,ZESTRIL) 20 MG tablet Take 1 tablet (20 mg total) by mouth daily. 30 tablet 2  . nitroGLYCERIN (NITROSTAT) 0.4 MG SL tablet Place 1 tablet (0.4 mg total) under the tongue every 5 (five) minutes as needed for chest pain. 30 tablet 12  . omega-3 acid ethyl esters (LOVAZA) 1 g capsule Take 1 capsule by mouth 2 (two) times daily.    . ondansetron (ZOFRAN)  4 MG tablet Take 1 tablet (4 mg total) by mouth daily as needed for nausea or vomiting. 20 tablet 1  . pantoprazole (PROTONIX) 40 MG tablet Take 1 tablet (40 mg total) by mouth daily. 30 tablet 5  . PARoxetine (PAXIL) 30 MG tablet Take 1 tablet (30 mg total) by mouth daily. 30 tablet 2  . potassium chloride (K-DUR) 10 MEQ tablet Currently not taking her potassium  Take 1 tablet (10 mEq total) by mouth daily. 30 tablet 6  . prazosin (MINIPRESS) 1 MG capsule Take 1 mg by mouth at bedtime.    Marland Kitchen QUEtiapine (SEROQUEL) 300 MG tablet Take 1  tablet (300 mg total) by mouth at bedtime. 30 tablet 2  . spironolactone (ALDACTONE) 25 MG tablet Take 1 tablet (25 mg total) by mouth daily. 90 tablet 3  . traMADol (ULTRAM) 50 MG tablet Take 1 tablet (50 mg total) by mouth every 8 (eight) hours as needed for moderate pain or severe pain. 20 tablet 0   No current facility-administered medications for this visit.      Allergies:   Patient has no known allergies.   Social History:  The patient  reports that she has quit smoking. She has never used smokeless tobacco. She reports that she uses drugs, including Marijuana. She reports that she does not drink alcohol.   Family History:   family history includes Hyperlipidemia in her mother; Hypertension in her mother.    Review of Systems: Review of Systems  Constitutional: Positive for malaise/fatigue.  Respiratory: Positive for shortness of breath.   Cardiovascular: Negative.        Abdominal swelling  Gastrointestinal: Negative.   Musculoskeletal: Negative.   Neurological: Negative.   Psychiatric/Behavioral: Negative.   All other systems reviewed and are negative.    PHYSICAL EXAM: VS:  BP (!) 118/98 (BP Location: Left Arm, Patient Position: Sitting, Cuff Size: Normal)   Pulse 83   Ht 5\' 5"  (1.651 m)   Wt 155 lb 8 oz (70.5 kg)   BMI 25.88 kg/m  , BMI Body mass index is 25.88 kg/m. GEN: Well nourished, well developed, in no acute distress, obese  HEENT: normal  Neck: + JVD 12 cm, carotid bruits, or masses Cardiac: RRR; no murmurs, rubs, or gallops,no edema  Respiratory:  clear to auscultation bilaterally, normal work of breathing GI: soft, nontender, nondistended, + BS MS: no deformity or atrophy  Skin: warm and dry, no rash Neuro:  Strength and sensation are intact Psych: euthymic mood, full affect    Recent Labs: 06/04/2016: Magnesium 2.0 07/31/2016: ALT 98; B Natriuretic Peptide 2,302.0; BUN 25; Creatinine, Ser 1.06; Hemoglobin 10.1; Platelets 234; Potassium 3.0;  Sodium 135    Lipid Panel No results found for: CHOL, HDL, LDLCALC, TRIG    Wt Readings from Last 3 Encounters:  08/21/16 155 lb 8 oz (70.5 kg)  07/31/16 153 lb (69.4 kg)  06/28/16 148 lb (67.1 kg)       ASSESSMENT AND PLAN:  Congestive heart failure, unspecified congestive heart failure chronicity, unspecified congestive heart failure type (HCC) -  Recently seen by Dr. Graciela Husbands for her ICD  She is not checking her weights at home Weight up today but she is wearing heavy went her boots For some reason she is not taking potassium Otherwise has her other medications Recommended she increase Lasix up to 60 mg twice a day We will hold her lisinopril, start entresto 49/51 mg twice a day. Samples provided today. Also given her company assistance forms  to fill out She felt much better on the entresto If she continues to have fluid retention despite higher dose Lasix, may need to change to torsemide  Hypertension, unspecified type Medication changes as above   Congestive dilated cardiomyopathy (HCC)  nonischemic cardiomyopathy per patient and CT scan showing no coronary calcifications, no PAD High risk for readmission. Stressed importance of daily weights, taking extra Lasix for weight gain, moderate her fluid and salt intake Would increase Lasix up to 60 twice a day with potassium twice a day  Other ascites She denies any further alcohol  Obesity due to excess calories without serious comorbidity, unspecified classification  ICD in place Seen by Dr. Graciela Husbands   Total encounter time more than 25 minutes  Greater than 50% was spent in counseling and coordination of care with the patient  Disposition:   F/U  1  month    Signed, Dossie Arbour, M.D., Ph.D. 08/21/2016  [facility] [place], [place] [phone]

## 2016-08-21 NOTE — Patient Instructions (Addendum)
Medication Instructions:   Please restart potassium 2 a day (you may need 3 a day for a few days to catch up)  We will provide paperwork for entresto patient assistance  We will stop the lisinopril,  start entresto 49/51 mg twice a day  You have an appointment at  [facility]  [address] [place], [place] [address] Phone: [phone] April 2 @ 3:00 (please arrive @ 2:45).  Labwork:  No new labs needed  Testing/Procedures:  No further testing at this time   I recommend watching educational videos on topics of interest to you at:       [email or web address]  Enter code: [email or web address]    Follow-Up: It was a pleasure seeing you in the office today. Please call us if you have new issues that need to be addressed before your next appt.  [phone]  Your physician wants you to follow-up in: 3 months.  You will receive a reminder letter in the mail two months in advance. If you don't receive a letter, please call our office to schedule the follow-up appointment.  If you need a refill on your cardiac medications before your next appointment, please call your pharmacy.

## 2016-09-02 NOTE — Progress Notes (Deleted)
[facility] Pulmonary Medicine Consultation      Assessment and Plan:  Obstructive Sleep Apnea.    Date: 09/02/2016  MRN# [id number] Bonnie Ochoa 1965-11-13  Referring Physician:   Nery Kalisz is a 51 y.o. old female seen in consultation for chief complaint of:   No chief complaint on file.   HPI:   The patient is a 51 yo female with a history of OSA on CPAP. She relocated from [place] and needs to establish herself with a new sleep doctor.    PMHX:   Past Medical History:  Diagnosis Date  . Asthma   . Bipolar 1 disorder (HCC)   . CHF (congestive heart failure) (HCC)   . Cirrhosis of liver (HCC)   . Coronary artery disease   . Depression   . Diabetes mellitus without complication (HCC)   . Hypertension    Surgical Hx:  Past Surgical History:  Procedure Laterality Date  . ABDOMINAL HYSTERECTOMY    . debribalator  2016  . hysterectmy  2011  . INSERT / REPLACE / REMOVE PACEMAKER    . INSERTION OF ICD    . TONSILECTOMY, ADENOIDECTOMY, BILATERAL MYRINGOTOMY AND TUBES    . TUBAL LIGATION  1980   Family Hx:  Family History  Problem Relation Age of Onset  . Hypertension Mother   . Hyperlipidemia Mother    Social Hx:   Social History  Substance Use Topics  . Smoking status: Former Games developer  . Smokeless tobacco: Never Used  . Alcohol use No   Medication:   Current Outpatient Rx  . Order #: [id number] Class: Print  . Order #: [id number] Class: Normal  . Order #: [id number] Class: Normal  . Order #: [id number] Class: Normal  . Order #: [id number] Class: Normal  . Order #: [id number] Class: Historical Med  . Order #: [id number] Class: Print  . Order #: [id number] Class: Normal  . Order #: [id number] Class: Normal  . Order #: [id number] Class: Normal  . Order #: [id number] Class: Historical Med  . Order #: [id number] Class: Normal  . Order #: [id number] Class: Normal  . Order #: [id number] Class: Print      Allergies:  Patient has no known allergies.  Review of Systems: Gen:   Denies  fever, sweats, chills HEENT: Denies blurred vision, double vision. bleeds, sore throat Cvc:  No dizziness, chest pain. Resp:   Denies cough or sputum production, shortness of breath Gi: Denies swallowing difficulty, stomach pain. Gu:  Denies bladder incontinence, burning urine Ext:   No Joint pain, stiffness. Skin: No skin rash,  hives  Endoc:  No polyuria, polydipsia. Psych: No depression, insomnia. Other:  All other systems were reviewed with the patient and were negative other that what is mentioned in the HPI.   Physical Examination:   VS: There were no vitals taken for this visit.  General Appearance: No distress  Neuro:without focal findings,  speech normal,  HEENT: PERRLA, EOM intact.   Pulmonary: normal breath sounds, No wheezing.  CardiovascularNormal S1,S2.  No m/r/g.   Abdomen: Benign, Soft, non-tender. Renal:  No costovertebral tenderness  GU:  No performed at this time. Endoc: No evident thyromegaly, no signs of acromegaly. Skin:   warm, no rashes, no ecchymosis  Extremities: normal, no cyanosis, clubbing.  Other findings:    LABORATORY PANEL:   CBC No results for input(s): WBC, HGB, HCT, PLT in the last 168 hours. ------------------------------------------------------------------------------------------------------------------  Chemistries  No results for input(s): NA, K, CL, CO2, GLUCOSE, BUN, CREATININE, CALCIUM, MG, AST, ALT, ALKPHOS, BILITOT in the last 168 hours.  Invalid input(s): GFRCGP ------------------------------------------------------------------------------------------------------------------  Cardiac Enzymes No results for input(s): TROPONINI in the last 168 hours. ------------------------------------------------------------  RADIOLOGY:  No results found.     Thank  you for the consultation and for allowing [facility] to assist in the care of your patient. Our recommendations are noted above.  Please  contact us if we can be of further service.   Wells Guiles, MD.  Board Certified in Internal Medicine, Pulmonary Medicine, Critical Care Medicine, and Sleep Medicine.  [facility] Office Number: [phone]  Santiago Glad, M.D.  Stephanie Acre, M.D.  Billy Fischer, M.D  09/02/2016

## 2016-09-03 ENCOUNTER — Encounter: Payer: Self-pay | Admitting: *Deleted

## 2016-09-03 ENCOUNTER — Institutional Professional Consult (permissible substitution): Payer: Medicare (Managed Care) | Admitting: Internal Medicine

## 2016-09-09 ENCOUNTER — Encounter: Payer: Self-pay | Admitting: Emergency Medicine

## 2016-09-09 DIAGNOSIS — S299XXA Unspecified injury of thorax, initial encounter: Secondary | ICD-10-CM | POA: Diagnosis not present

## 2016-09-09 DIAGNOSIS — W182XXA Fall in (into) shower or empty bathtub, initial encounter: Secondary | ICD-10-CM | POA: Diagnosis present

## 2016-09-09 DIAGNOSIS — R0602 Shortness of breath: Secondary | ICD-10-CM | POA: Diagnosis not present

## 2016-09-09 DIAGNOSIS — J45909 Unspecified asthma, uncomplicated: Secondary | ICD-10-CM | POA: Diagnosis present

## 2016-09-09 DIAGNOSIS — Z90710 Acquired absence of both cervix and uterus: Secondary | ICD-10-CM

## 2016-09-09 DIAGNOSIS — I110 Hypertensive heart disease with heart failure: Principal | ICD-10-CM | POA: Diagnosis present

## 2016-09-09 DIAGNOSIS — Z79899 Other long term (current) drug therapy: Secondary | ICD-10-CM

## 2016-09-09 DIAGNOSIS — R42 Dizziness and giddiness: Secondary | ICD-10-CM | POA: Diagnosis not present

## 2016-09-09 DIAGNOSIS — K7460 Unspecified cirrhosis of liver: Secondary | ICD-10-CM | POA: Diagnosis present

## 2016-09-09 DIAGNOSIS — Z87891 Personal history of nicotine dependence: Secondary | ICD-10-CM | POA: Diagnosis not present

## 2016-09-09 DIAGNOSIS — K529 Noninfective gastroenteritis and colitis, unspecified: Secondary | ICD-10-CM | POA: Diagnosis present

## 2016-09-09 DIAGNOSIS — E119 Type 2 diabetes mellitus without complications: Secondary | ICD-10-CM | POA: Diagnosis present

## 2016-09-09 DIAGNOSIS — R55 Syncope and collapse: Secondary | ICD-10-CM | POA: Diagnosis not present

## 2016-09-09 DIAGNOSIS — R109 Unspecified abdominal pain: Secondary | ICD-10-CM | POA: Diagnosis not present

## 2016-09-09 DIAGNOSIS — R188 Other ascites: Secondary | ICD-10-CM | POA: Diagnosis present

## 2016-09-09 DIAGNOSIS — Z7982 Long term (current) use of aspirin: Secondary | ICD-10-CM

## 2016-09-09 DIAGNOSIS — Y93E1 Activity, personal bathing and showering: Secondary | ICD-10-CM

## 2016-09-09 DIAGNOSIS — I2510 Atherosclerotic heart disease of native coronary artery without angina pectoris: Secondary | ICD-10-CM | POA: Diagnosis present

## 2016-09-09 DIAGNOSIS — I5023 Acute on chronic systolic (congestive) heart failure: Secondary | ICD-10-CM | POA: Diagnosis present

## 2016-09-09 DIAGNOSIS — Z8249 Family history of ischemic heart disease and other diseases of the circulatory system: Secondary | ICD-10-CM

## 2016-09-09 DIAGNOSIS — I428 Other cardiomyopathies: Secondary | ICD-10-CM | POA: Diagnosis present

## 2016-09-09 DIAGNOSIS — F329 Major depressive disorder, single episode, unspecified: Secondary | ICD-10-CM | POA: Diagnosis present

## 2016-09-09 LAB — CBC
HCT: 32.6 % — ABNORMAL LOW (ref 35.0–47.0)
Hemoglobin: 10.8 g/dL — ABNORMAL LOW (ref 12.0–16.0)
MCH: 28.5 pg (ref 26.0–34.0)
MCHC: 33.1 g/dL (ref 32.0–36.0)
MCV: 86.4 fL (ref 80.0–100.0)
PLATELETS: 292 10*3/uL (ref 150–440)
RBC: 3.78 MIL/uL — AB (ref 3.80–5.20)
RDW: 20 % — ABNORMAL HIGH (ref 11.5–14.5)
WBC: 6.3 10*3/uL (ref 3.6–11.0)

## 2016-09-09 LAB — COMPREHENSIVE METABOLIC PANEL
ALK PHOS: 84 U/L (ref 38–126)
ALT: 26 U/L (ref 14–54)
AST: 27 U/L (ref 15–41)
Albumin: 3.3 g/dL — ABNORMAL LOW (ref 3.5–5.0)
Anion gap: 8 (ref 5–15)
BILIRUBIN TOTAL: 2.4 mg/dL — AB (ref 0.3–1.2)
BUN: 24 mg/dL — ABNORMAL HIGH (ref 6–20)
CALCIUM: 8.7 mg/dL — AB (ref 8.9–10.3)
CO2: 23 mmol/L (ref 22–32)
CREATININE: 1.13 mg/dL — AB (ref 0.44–1.00)
Chloride: 105 mmol/L (ref 101–111)
GFR, EST NON AFRICAN AMERICAN: 55 mL/min — AB (ref >60)
Glucose, Bld: 214 mg/dL — ABNORMAL HIGH (ref 65–99)
Potassium: 3.6 mmol/L (ref 3.5–5.1)
Sodium: 136 mmol/L (ref 135–145)
TOTAL PROTEIN: 7.8 g/dL (ref 6.5–8.1)

## 2016-09-09 LAB — TROPONIN I: TROPONIN I: 0.04 ng/mL — AB (ref <0.03)

## 2016-09-09 LAB — LIPASE, BLOOD: Lipase: 28 U/L (ref 11–51)

## 2016-09-09 NOTE — ED Triage Notes (Signed)
Pt states generalized abd pain with cough and vomiting since yesterday. Pt's so states tonight pt became weak in the shower and fell. Pt with dry cough noted in triage. Pt denies diarrhea, states has had chills.

## 2016-09-10 ENCOUNTER — Emergency Department: Payer: Medicare Other

## 2016-09-10 ENCOUNTER — Inpatient Hospital Stay: Payer: Medicare Other

## 2016-09-10 ENCOUNTER — Inpatient Hospital Stay
Admission: EM | Admit: 2016-09-10 | Discharge: 2016-09-11 | DRG: 292 | Disposition: A | Discharge status: Home / Self Care | Payer: Medicare Other | Attending: Internal Medicine | Admitting: Internal Medicine

## 2016-09-10 ENCOUNTER — Encounter: Payer: Self-pay | Admitting: Radiology

## 2016-09-10 DIAGNOSIS — I110 Hypertensive heart disease with heart failure: Secondary | ICD-10-CM | POA: Diagnosis present

## 2016-09-10 DIAGNOSIS — Y93E1 Activity, personal bathing and showering: Secondary | ICD-10-CM | POA: Diagnosis not present

## 2016-09-10 DIAGNOSIS — R55 Syncope and collapse: Secondary | ICD-10-CM

## 2016-09-10 DIAGNOSIS — I5023 Acute on chronic systolic (congestive) heart failure: Secondary | ICD-10-CM | POA: Diagnosis not present

## 2016-09-10 DIAGNOSIS — Z90710 Acquired absence of both cervix and uterus: Secondary | ICD-10-CM | POA: Diagnosis not present

## 2016-09-10 DIAGNOSIS — E119 Type 2 diabetes mellitus without complications: Secondary | ICD-10-CM | POA: Diagnosis present

## 2016-09-10 DIAGNOSIS — I2510 Atherosclerotic heart disease of native coronary artery without angina pectoris: Secondary | ICD-10-CM | POA: Diagnosis present

## 2016-09-10 DIAGNOSIS — F329 Major depressive disorder, single episode, unspecified: Secondary | ICD-10-CM | POA: Diagnosis present

## 2016-09-10 DIAGNOSIS — W182XXA Fall in (into) shower or empty bathtub, initial encounter: Secondary | ICD-10-CM | POA: Diagnosis present

## 2016-09-10 DIAGNOSIS — R0602 Shortness of breath: Secondary | ICD-10-CM | POA: Diagnosis not present

## 2016-09-10 DIAGNOSIS — R188 Other ascites: Secondary | ICD-10-CM | POA: Diagnosis present

## 2016-09-10 DIAGNOSIS — I428 Other cardiomyopathies: Secondary | ICD-10-CM | POA: Diagnosis not present

## 2016-09-10 DIAGNOSIS — K7460 Unspecified cirrhosis of liver: Secondary | ICD-10-CM | POA: Diagnosis present

## 2016-09-10 DIAGNOSIS — Z79899 Other long term (current) drug therapy: Secondary | ICD-10-CM | POA: Diagnosis not present

## 2016-09-10 DIAGNOSIS — S299XXA Unspecified injury of thorax, initial encounter: Secondary | ICD-10-CM | POA: Diagnosis not present

## 2016-09-10 DIAGNOSIS — K529 Noninfective gastroenteritis and colitis, unspecified: Secondary | ICD-10-CM | POA: Diagnosis present

## 2016-09-10 DIAGNOSIS — I509 Heart failure, unspecified: Secondary | ICD-10-CM

## 2016-09-10 DIAGNOSIS — R42 Dizziness and giddiness: Secondary | ICD-10-CM

## 2016-09-10 DIAGNOSIS — Z8249 Family history of ischemic heart disease and other diseases of the circulatory system: Secondary | ICD-10-CM | POA: Diagnosis not present

## 2016-09-10 DIAGNOSIS — J45909 Unspecified asthma, uncomplicated: Secondary | ICD-10-CM | POA: Diagnosis present

## 2016-09-10 DIAGNOSIS — R109 Unspecified abdominal pain: Secondary | ICD-10-CM | POA: Diagnosis not present

## 2016-09-10 DIAGNOSIS — Z7982 Long term (current) use of aspirin: Secondary | ICD-10-CM | POA: Diagnosis not present

## 2016-09-10 DIAGNOSIS — Z87891 Personal history of nicotine dependence: Secondary | ICD-10-CM | POA: Diagnosis not present

## 2016-09-10 LAB — URINALYSIS, COMPLETE (UACMP) WITH MICROSCOPIC
BILIRUBIN URINE: NEGATIVE
GLUCOSE, UA: NEGATIVE mg/dL
HGB URINE DIPSTICK: NEGATIVE
Ketones, ur: NEGATIVE mg/dL
LEUKOCYTES UA: NEGATIVE
NITRITE: NEGATIVE
PROTEIN: 30 mg/dL — AB
SPECIFIC GRAVITY, URINE: 1.021 (ref 1.005–1.030)
pH: 5 (ref 5.0–8.0)

## 2016-09-10 LAB — BODY FLUID CELL COUNT WITH DIFFERENTIAL
EOS FL: 0 %
LYMPHS FL: 53 %
MONOCYTE-MACROPHAGE-SEROUS FLUID: 17 %
NEUTROPHIL FLUID: 30 %
Other Cells, Fluid: 0 %
WBC FLUID: 490 uL

## 2016-09-10 LAB — GLUCOSE, CAPILLARY
Glucose-Capillary: 160 mg/dL — ABNORMAL HIGH (ref 65–99)
Glucose-Capillary: 146 mg/dL — ABNORMAL HIGH (ref 65–99)
Glucose-Capillary: 111 mg/dL — ABNORMAL HIGH (ref 65–99)

## 2016-09-10 LAB — ALBUMIN, FLUID (OTHER): Albumin, Fluid: 1.7 g/dL

## 2016-09-10 LAB — TROPONIN I
Troponin I: 0.04 ng/mL (ref <0.03)
Troponin I: 0.04 ng/mL (ref <0.03)

## 2016-09-10 LAB — GLUCOSE, PERITONEAL FLUID: GLUCOSE, PERITONEAL FLUID: 159 mg/dL

## 2016-09-10 MED ORDER — IOPAMIDOL (ISOVUE-300) INJECTION 61%
30.0000 mL | Freq: Once | INTRAVENOUS | Status: AC | PRN
  Administered 2016-09-10: 30 mL via ORAL

## 2016-09-10 MED ORDER — SODIUM CHLORIDE 0.9 % IV SOLN: 250.0000 mL | INTRAVENOUS | Status: DC | PRN

## 2016-09-10 MED ORDER — POTASSIUM CHLORIDE ER 10 MEQ PO TBCR
10.0000 meq | EXTENDED_RELEASE_TABLET | Freq: Two times a day (BID) | ORAL | Status: DC
  Administered 2016-09-10 – 2016-09-11 (×3): 10 meq via ORAL
  Filled 2016-09-10 (×7): qty 1

## 2016-09-10 MED ORDER — PAROXETINE HCL 20 MG PO TABS
30.0000 mg | ORAL_TABLET | Freq: Every day | ORAL | Status: DC
  Administered 2016-09-10 – 2016-09-11 (×2): 30 mg via ORAL
  Filled 2016-09-10 (×2): qty 1

## 2016-09-10 MED ORDER — SPIRONOLACTONE 25 MG PO TABS
25.0000 mg | ORAL_TABLET | Freq: Every day | ORAL | Status: DC
  Administered 2016-09-10 – 2016-09-11 (×2): 25 mg via ORAL
  Filled 2016-09-10 (×2): qty 1

## 2016-09-10 MED ORDER — ACETAMINOPHEN 325 MG PO TABS: 650.0000 mg | ORAL_TABLET | Freq: Four times a day (QID) | ORAL | Status: DC | PRN

## 2016-09-10 MED ORDER — ATORVASTATIN CALCIUM 20 MG PO TABS
40.0000 mg | ORAL_TABLET | Freq: Every day | ORAL | Status: DC
  Administered 2016-09-10 – 2016-09-11 (×2): 40 mg via ORAL
  Filled 2016-09-10 (×2): qty 2

## 2016-09-10 MED ORDER — LISINOPRIL 20 MG PO TABS
20.0000 mg | ORAL_TABLET | Freq: Every day | ORAL | Status: DC
  Administered 2016-09-10 – 2016-09-11 (×2): 20 mg via ORAL
  Filled 2016-09-10 (×2): qty 1

## 2016-09-10 MED ORDER — SODIUM CHLORIDE 0.9% FLUSH: 3.0000 mL | INTRAVENOUS | Status: DC | PRN

## 2016-09-10 MED ORDER — INSULIN ASPART 100 UNIT/ML ~~LOC~~ SOLN
0.0000 [IU] | Freq: Three times a day (TID) | SUBCUTANEOUS | Status: DC
  Administered 2016-09-10: 1 [IU] via SUBCUTANEOUS
  Administered 2016-09-10 – 2016-09-11 (×3): 2 [IU] via SUBCUTANEOUS
  Filled 2016-09-10 (×3): qty 2
  Filled 2016-09-10: qty 1

## 2016-09-10 MED ORDER — QUETIAPINE FUMARATE 300 MG PO TABS
300.0000 mg | ORAL_TABLET | Freq: Every day | ORAL | Status: DC
  Administered 2016-09-10: 300 mg via ORAL
  Filled 2016-09-10: qty 1

## 2016-09-10 MED ORDER — SODIUM CHLORIDE 0.9 % IV BOLUS (SEPSIS)
500.0000 mL | Freq: Once | INTRAVENOUS | Status: AC
  Administered 2016-09-10: 500 mL via INTRAVENOUS

## 2016-09-10 MED ORDER — ONDANSETRON HCL 4 MG PO TABS: 4.0000 mg | ORAL_TABLET | Freq: Every day | ORAL | Status: DC | PRN

## 2016-09-10 MED ORDER — SODIUM CHLORIDE 0.9% FLUSH: 3.0000 mL | Freq: Two times a day (BID) | INTRAVENOUS | Status: DC

## 2016-09-10 MED ORDER — PRAZOSIN HCL 1 MG PO CAPS
1.0000 mg | ORAL_CAPSULE | Freq: Every day | ORAL | Status: DC
  Administered 2016-09-10: 1 mg via ORAL
  Filled 2016-09-10: qty 1

## 2016-09-10 MED ORDER — ONDANSETRON HCL 4 MG/2ML IJ SOLN: 4.0000 mg | Freq: Four times a day (QID) | INTRAMUSCULAR | Status: DC | PRN

## 2016-09-10 MED ORDER — NITROGLYCERIN 0.4 MG SL SUBL: 0.4000 mg | SUBLINGUAL_TABLET | SUBLINGUAL | Status: DC | PRN

## 2016-09-10 MED ORDER — CARVEDILOL 6.25 MG PO TABS
6.2500 mg | ORAL_TABLET | Freq: Two times a day (BID) | ORAL | Status: DC
  Administered 2016-09-10 – 2016-09-11 (×4): 6.25 mg via ORAL
  Filled 2016-09-10 (×4): qty 1

## 2016-09-10 MED ORDER — TRAMADOL HCL 50 MG PO TABS: 50.0000 mg | ORAL_TABLET | Freq: Three times a day (TID) | ORAL | Status: DC | PRN

## 2016-09-10 MED ORDER — ONDANSETRON HCL 4 MG PO TABS: 4.0000 mg | ORAL_TABLET | Freq: Four times a day (QID) | ORAL | Status: DC | PRN

## 2016-09-10 MED ORDER — MORPHINE SULFATE (PF) 4 MG/ML IV SOLN
4.0000 mg | Freq: Once | INTRAVENOUS | Status: AC
  Administered 2016-09-10: 4 mg via INTRAVENOUS
  Filled 2016-09-10: qty 1

## 2016-09-10 MED ORDER — IOPAMIDOL (ISOVUE-300) INJECTION 61%
100.0000 mL | Freq: Once | INTRAVENOUS | Status: AC | PRN
  Administered 2016-09-10: 100 mL via INTRAVENOUS

## 2016-09-10 MED ORDER — ACETAMINOPHEN 650 MG RE SUPP: 650.0000 mg | Freq: Four times a day (QID) | RECTAL | Status: DC | PRN

## 2016-09-10 MED ORDER — ENOXAPARIN SODIUM 40 MG/0.4ML ~~LOC~~ SOLN
40.0000 mg | SUBCUTANEOUS | Status: DC
  Administered 2016-09-10: 40 mg via SUBCUTANEOUS
  Filled 2016-09-10: qty 0.4

## 2016-09-10 MED ORDER — HYDRALAZINE HCL 20 MG/ML IJ SOLN
10.0000 mg | Freq: Four times a day (QID) | INTRAMUSCULAR | Status: DC | PRN
  Administered 2016-09-10: 10 mg via INTRAVENOUS
  Filled 2016-09-10: qty 1

## 2016-09-10 MED ORDER — ONDANSETRON HCL 4 MG/2ML IJ SOLN
4.0000 mg | Freq: Once | INTRAMUSCULAR | Status: AC
  Administered 2016-09-10: 4 mg via INTRAVENOUS
  Filled 2016-09-10: qty 2

## 2016-09-10 MED ORDER — PANTOPRAZOLE SODIUM 40 MG PO TBEC
40.0000 mg | DELAYED_RELEASE_TABLET | Freq: Every day | ORAL | Status: DC
  Administered 2016-09-10 – 2016-09-11 (×2): 40 mg via ORAL
  Filled 2016-09-10 (×2): qty 1

## 2016-09-10 MED ORDER — OMEGA-3-ACID ETHYL ESTERS 1 G PO CAPS
1.0000 | ORAL_CAPSULE | Freq: Two times a day (BID) | ORAL | Status: DC
  Administered 2016-09-10 – 2016-09-11 (×3): 1 g via ORAL
  Filled 2016-09-10 (×3): qty 1

## 2016-09-10 MED ORDER — FUROSEMIDE 10 MG/ML IJ SOLN
20.0000 mg | Freq: Two times a day (BID) | INTRAMUSCULAR | Status: DC
  Administered 2016-09-10 (×2): 20 mg via INTRAVENOUS
  Filled 2016-09-10: qty 2
  Filled 2016-09-10: qty 4

## 2016-09-10 MED ORDER — HYDROCODONE-ACETAMINOPHEN 5-325 MG PO TABS
1.0000 | ORAL_TABLET | ORAL | Status: DC | PRN
  Administered 2016-09-10: 1 via ORAL
  Filled 2016-09-10: qty 1

## 2016-09-10 MED ORDER — SODIUM CHLORIDE 0.9% FLUSH
3.0000 mL | Freq: Two times a day (BID) | INTRAVENOUS | Status: DC
  Administered 2016-09-10 – 2016-09-11 (×3): 3 mL via INTRAVENOUS

## 2016-09-10 MED ORDER — ASPIRIN EC 81 MG PO TBEC
81.0000 mg | DELAYED_RELEASE_TABLET | Freq: Every day | ORAL | Status: DC
  Administered 2016-09-10 – 2016-09-11 (×2): 81 mg via ORAL
  Filled 2016-09-10 (×2): qty 1

## 2016-09-10 NOTE — H&P (Signed)
[facility]   PATIENT NAME: Bonnie Ochoa    MR#:  [id number]  DATE OF BIRTH:  [birth year]  DATE OF ADMISSION:  09/10/2016  PRIMARY CARE PHYSICIAN: No PCP Per Patient   REQUESTING/REFERRING PHYSICIAN: McShane MD  CHIEF COMPLAINT:   Chief Complaint  Patient presents with  . Abdominal Pain    HISTORY OF PRESENT ILLNESS: Bonnie Ochoa  is a 51 y.o. female with a known history of Asthma, bipolar 1 disorder, chronic systolic CHF, liver cirrhosis, coronary artery disease, depression, diabetes type 2 and hypertension who is presenting to the ED with having multiple complaints. Asian complains of chest pain shortness of breath abdominal pain dysuria or urinary frequency and syncope 2 with lightheadedness. She's been having these symptoms for the last 3 days. She has not had any fevers chills. No recent antibiotics. She reports that she has no primary care provider.  PAST MEDICAL HISTORY:   Past Medical History:  Diagnosis Date  . Asthma   . Bipolar 1 disorder (HCC)   . CHF (congestive heart failure) (HCC)   . Cirrhosis of liver (HCC)   . Coronary artery disease   . Depression   . Diabetes mellitus without complication (HCC)   . Hypertension     PAST SURGICAL HISTORY: Past Surgical History:  Procedure Laterality Date  . ABDOMINAL HYSTERECTOMY    . debribalator  2016  . hysterectmy  2011  . INSERT / REPLACE / REMOVE PACEMAKER    . INSERTION OF ICD    . TONSILECTOMY, ADENOIDECTOMY, BILATERAL MYRINGOTOMY AND TUBES    . TUBAL LIGATION  1980    SOCIAL HISTORY:  Social History  Substance Use Topics  . Smoking status: Former Games developer  . Smokeless tobacco: Never Used  . Alcohol use No    FAMILY HISTORY:  Family History  Problem Relation Age of Onset  . Hypertension Mother   . Hyperlipidemia Mother     DRUG ALLERGIES: No Known Allergies  REVIEW OF SYSTEMS:   CONSTITUTIONAL: No fever, Positive fatigue and weakness.  EYES: No blurred or  double vision.  EARS, NOSE, AND THROAT: No tinnitus or ear pain.  RESPIRATORY: No cough, shortness of breath, wheezing or hemoptysis.  CARDIOVASCULAR: Positive chest pain, orthopnea, edema.  GASTROINTESTINAL: No nausea, vomiting, positive diarrhea positive abdominal pain.  GENITOURINARY: No dysuria, hematuria.  ENDOCRINE: No polyuria, nocturia,  HEMATOLOGY: No anemia, easy bruising or bleeding SKIN: No rash or lesion. MUSCULOSKELETAL: No joint pain or arthritis.   NEUROLOGIC: No tingling, numbness, weakness.  PSYCHIATRY: No anxiety or depression.   MEDICATIONS AT HOME:  Prior to Admission medications   Medication Sig Start Date End Date Taking? Authorizing Provider  aspirin EC 81 MG tablet Take 1 tablet (81 mg total) by mouth daily. 06/04/16  Yes Enid Baas, MD  atorvastatin (LIPITOR) 40 MG tablet Take 1 tablet (40 mg total) by mouth daily. 08/21/16  Yes Antonieta Iba, MD  carvedilol (COREG) 6.25 MG tablet Take 1 tablet (6.25 mg total) by mouth 2 (two) times daily with a meal. 08/21/16  Yes Antonieta Iba, MD  furosemide (LASIX) 40 MG tablet Take 1.5 tablets (60 mg total) by mouth 2 (two) times daily. 08/21/16  Yes Antonieta Iba, MD  lisinopril (PRINIVIL,ZESTRIL) 20 MG tablet Take 20 mg by mouth daily.   Yes Historical Provider, MD  nitroGLYCERIN (NITROSTAT) 0.4 MG SL tablet Place 1 tablet (0.4 mg total) under the tongue every 5 (five) minutes as needed for chest pain.  06/04/16  Yes Enid Baas, MD  omega-3 acid ethyl esters (LOVAZA) 1 g capsule Take 1 capsule by mouth 2 (two) times daily.   Yes Historical Provider, MD  ondansetron (ZOFRAN) 4 MG tablet Take 1 tablet (4 mg total) by mouth daily as needed for nausea or vomiting. 04/30/16  Yes Emily Filbert, MD  pantoprazole (PROTONIX) 40 MG tablet Take 1 tablet (40 mg total) by mouth daily. 05/24/16 05/24/17 Yes Darren Servando Snare, MD  PARoxetine (PAXIL) 30 MG tablet Take 1 tablet (30 mg total) by mouth daily. 06/04/16  Yes  Enid Baas, MD  potassium chloride (K-DUR) 10 MEQ tablet Take 1 tablet (10 mEq total) by mouth 2 (two) times daily. 08/21/16 08/21/17 Yes Antonieta Iba, MD  prazosin (MINIPRESS) 1 MG capsule Take 1 mg by mouth at bedtime.   Yes Historical Provider, MD  QUEtiapine (SEROQUEL) 300 MG tablet Take 1 tablet (300 mg total) by mouth at bedtime. 06/04/16  Yes Enid Baas, MD  spironolactone (ALDACTONE) 25 MG tablet Take 1 tablet (25 mg total) by mouth daily. 08/21/16  Yes Antonieta Iba, MD  traMADol (ULTRAM) 50 MG tablet Take 1 tablet (50 mg total) by mouth every 8 (eight) hours as needed for moderate pain or severe pain. 06/04/16  Yes Enid Baas, MD      PHYSICAL EXAMINATION:   VITAL SIGNS: Blood pressure (!) 153/106, pulse 80, temperature 97.8 F (36.6 C), temperature source Oral, resp. rate 15, SpO2 91 %.  GENERAL:  51 y.o.-year-old patient lying in the bed with no acute distress.  EYES: Pupils equal, round, reactive to light and accommodation. No scleral icterus. Extraocular muscles intact.  HEENT: Head atraumatic, normocephalic. Oropharynx and nasopharynx clear.  NECK:  Supple, no jugular venous distention. No thyroid enlargement, no tenderness.  LUNGS: Normal breath sounds bilaterally, no wheezing, rales,rhonchi or crepitation. No use of accessory muscles of respiration.  CARDIOVASCULAR: S1, S2 normal. No murmurs, rubs, or gallops.  ABDOMEN: Soft, nontender, nondistended. Bowel sounds present. No organomegaly or mass.  EXTREMITIES: Positive pedal edema, no cyanosis, or clubbing.  NEUROLOGIC: Cranial nerves II through XII are intact. Muscle strength 5/5 in all extremities. Sensation intact. Gait not checked.  PSYCHIATRIC: The patient is alert and oriented x 3.  SKIN: No obvious rash, lesion, or ulcer.   LABORATORY PANEL:   CBC  Recent Labs Lab 09/09/16 2305  WBC 6.3  HGB 10.8*  HCT 32.6*  PLT 292  MCV 86.4  MCH 28.5  MCHC 33.1  RDW 20.0*    ------------------------------------------------------------------------------------------------------------------  Chemistries   Recent Labs Lab 09/09/16 2305  NA 136  K 3.6  CL 105  CO2 23  GLUCOSE 214*  BUN 24*  CREATININE 1.13*  CALCIUM 8.7*  AST 27  ALT 26  ALKPHOS 84  BILITOT 2.4*   ------------------------------------------------------------------------------------------------------------------ CrCl cannot be calculated (Unknown ideal weight.). ------------------------------------------------------------------------------------------------------------------ No results for input(s): TSH, T4TOTAL, T3FREE, THYROIDAB in the last 72 hours.  Invalid input(s): FREET3   Coagulation profile No results for input(s): INR, PROTIME in the last 168 hours. ------------------------------------------------------------------------------------------------------------------- No results for input(s): DDIMER in the last 72 hours. -------------------------------------------------------------------------------------------------------------------  Cardiac Enzymes  Recent Labs Lab 09/09/16 2305  TROPONINI 0.04*   ------------------------------------------------------------------------------------------------------------------ Invalid input(s): POCBNP  ---------------------------------------------------------------------------------------------------------------  Urinalysis    Component Value Date/Time   COLORURINE YELLOW (A) 09/10/2016 0018   APPEARANCEUR CLEAR (A) 09/10/2016 0018   LABSPEC 1.021 09/10/2016 0018   PHURINE 5.0 09/10/2016 0018   GLUCOSEU NEGATIVE 09/10/2016 0018   HGBUR NEGATIVE 09/10/2016 0018  BILIRUBINUR NEGATIVE 09/10/2016 0018   KETONESUR NEGATIVE 09/10/2016 0018   PROTEINUR 30 (A) 09/10/2016 0018   NITRITE NEGATIVE 09/10/2016 0018   LEUKOCYTESUR NEGATIVE 09/10/2016 0018     RADIOLOGY: Dg Chest 2 View  Result Date: 09/10/2016 CLINICAL DATA:   Abdominal pain, onset yesterday. Fell in the shower. EXAM: CHEST  2 VIEW COMPARISON:  07/31/2016 FINDINGS: Unchanged moderate cardiomegaly. Intact appearances of the transvenous cardiac lead. The lungs are clear except for unchanged curvilinear scarring in the left lung laterally. The pulmonary vasculature is normal. There is no pleural effusion. Hilar and mediastinal contours are unremarkable and unchanged. IMPRESSION: Stable cardiomegaly.  No acute cardiopulmonary findings. Electronically Signed   By: Ellery Plunk M.D.   On: 09/10/2016 00:58   Ct Abdomen Pelvis W Contrast  Result Date: 09/10/2016 CLINICAL DATA:  Initial evaluation for acute abdominal pain. History of cirrhosis, CHF. EXAM: CT ABDOMEN AND PELVIS WITH CONTRAST TECHNIQUE: Multidetector CT imaging of the abdomen and pelvis was performed using the standard protocol following bolus administration of intravenous contrast. CONTRAST:  ISOVUE-300 IOPAMIDOL (ISOVUE-300) INJECTION 61%, 30mL ISOVUE-300 IOPAMIDOL (ISOVUE-300) INJECTION 61% COMPARISON:  Prior CT from 05/30/2016. FINDINGS: Lower chest: Cardiomegaly partially visualized. Reflux of contrast into the IVC and hepatic veins suggest right heart dysfunction. No pleural or pericardial fusion. No evidence for pulmonary edema within the visualized lung bases. Mild left basilar atelectasis/scarring. Hepatobiliary: Heterogeneous enhancement of the liver with nutmeg appearance, compatible with congestive hepatopathy. Mild irregularity of the hepatic contour or suggest underlying cirrhosis. Overall, changes are similar to previous. Pericholecystic free fluid likely related to overall volume status/ascites. No biliary dilatation. Pancreas: Pancreas within normal limits. Spleen: Spleen within normal limits. Adrenals/Urinary Tract: Adrenal glands unremarkable. Kidneys equal in size with symmetric enhancement. No nephrolithiasis, hydronephrosis, or focal enhancing renal mass. No hydroureter. Bladder  partially distended without abnormality. Stomach/Bowel: Small hiatal hernia. Stomach otherwise unremarkable. No evidence for bowel obstruction. No acute inflammatory changes seen about the small bowel. Mild circumferential wall thickening about the distal colon, which may be related to underlying hepatic disease, although acute proctocolitis could also be considered. No other significant inflammatory changes about the bowels. Moderate amount of retained stool within the proximal colon. Appendix is normal. Vascular/Lymphatic: Normal intravascular enhancement seen throughout the abdomen and pelvis. Mildly prominent left inguinal lymph nodes measure up to 13 mm (series 2, image 86), indeterminate. This is increased from prior. No other significant adenopathy identified. Reproductive: Uterus is absent.  Ovaries stable in appearance. Other: No free air.  Moderate volume ascites. Musculoskeletal: Diffuse anasarca. Prominent degenerative spondylolysis about L4-5. No acute osseous abnormality. No worrisome lytic or blastic osseous lesions. IMPRESSION: 1. Cardiomegaly with evidence for right heart dysfunction. Associated congestive hepatopathy with possible mild underlying cirrhosis, similar to previous. 2. Moderate volume ascites with diffuse anasarca. 3. Circumferential wall thickening about the distal colon with question of hazy inflammatory stranding. While this finding may in part be related underlying intrinsic liver disease, possible acute proctocolitis could also be considered. 4. Mildly prominent left inguinal lymph nodes measuring up to 13 mm, indeterminate. No other adenopathy identified. Electronically Signed   By: Rise Mu M.D.   On: 09/10/2016 04:13    EKG: Orders placed or performed during the hospital encounter of 09/10/16  . ED EKG  . ED EKG  . EKG 12-Lead  . EKG 12-Lead    IMPRESSION AND PLAN: Patient is 51 year old African-American female with multiple medical problems presenting  with shortness of breath and abdominal swelling  1. Shortness  of breath suspect due to acute systolic CHF Patient noted to have anasarca and ascites We will treat with IV Lasix.  2. Abdominal pain Likely related to worsening ascites. As well as edema involving her bowel wall. I do not think this is colitis We will send stool for C. difficile and panculture in light of diarrhea If symptoms persist GI consult We will have her ascites drained  3. Diabetes type 2 We'll place on sliding scale insulin  4. Liver cirrhosis continue Aldactone  5. Essential hypertension we'll continue her home regimen add when necessary hydralazine  All the records are reviewed and case discussed with ED provider. Management plans discussed with the patient, family and they are in agreement.  CODE STATUS: Code Status History    Date Active Date Inactive Code Status Order ID Comments User Context   05/30/2016 11:29 AM 06/04/2016 10:26 PM Full Code [id number]  Enedina Finner, MD Inpatient       TOTAL TIME TAKING CARE OF THIS PATIENT: .    Auburn Bilberry M.D on 09/10/2016 at 7:49 AM  Between 7am to 6pm - Pager - [phone]  After 6pm go to [email or web address] - password [email or web address] [facility]  [facility]  Office  [phone]  CC: Primary care physician; No PCP Per Patient

## 2016-09-10 NOTE — ED Notes (Signed)
To US for paracentesis ?

## 2016-09-10 NOTE — Procedures (Signed)
US diagnostic paracentesis without difficulty  Complications:  None  Blood Loss: none  See dictation in canopy pacs  

## 2016-09-10 NOTE — ED Provider Notes (Signed)
[facility] Emergency Department Provider Note  ____________________________________________   I have reviewed the triage vital signs and the nursing notes.   HISTORY  Chief Complaint Abdominal Pain    HPI Bonnie Ochoa is a 51 y.o. female with a history of bipolar disorder asthma, ascites, CHF, who does not drink alcohol she states, history of diabetes, who has had most of her care in [place]. Presents today with multiple different complaint. Patient complains of chest pain, shortness of breath, abdominal pain, dysuria, urinary frequency, free syncope 2 today with lightheadedness, nausea and vomiting, and diarrhea. She's not had a fever. She does have a slight cough. The biggest complaint she has is that she is very short of breath when she walks around.  The symptoms essentially a pan positive review of systems. One thing she has not has a fever. When I asked her to try to be as per sinuses she can about what exactly brought her and she said she sort of breath and feels that she's got a pass out at home.  Past Medical History:  Diagnosis Date  . Asthma   . Bipolar 1 disorder (HCC)   . CHF (congestive heart failure) (HCC)   . Cirrhosis of liver (HCC)   . Coronary artery disease   . Depression   . Diabetes mellitus without complication (HCC)   . Hypertension     Patient Active Problem List   Diagnosis Date Noted  . Acute on chronic systolic CHF (congestive heart failure) (HCC) 05/30/2016  . Pulmonary hypertension 05/30/2016  . Hypokalemia 05/30/2016  . NICM (nonischemic cardiomyopathy) (HCC) 05/30/2016  . Elevated troponin   . Other ascites   . Hypertension   . Diabetes mellitus without complication (HCC)   . Depression   . Bipolar 1 disorder (HCC)   . Asthma   . Generalized abdominal pain 05/07/2016    Past Surgical History:  Procedure Laterality Date  . ABDOMINAL HYSTERECTOMY    . debribalator  2016  . hysterectmy  2011  . INSERT / REPLACE /  REMOVE PACEMAKER    . INSERTION OF ICD    . TONSILECTOMY, ADENOIDECTOMY, BILATERAL MYRINGOTOMY AND TUBES    . TUBAL LIGATION  1980    Prior to Admission medications   Medication Sig Start Date End Date Taking? Authorizing Provider  aspirin EC 81 MG tablet Take 1 tablet (81 mg total) by mouth daily. 06/04/16  Yes Enid Baas, MD  atorvastatin (LIPITOR) 40 MG tablet Take 1 tablet (40 mg total) by mouth daily. 08/21/16  Yes Antonieta Iba, MD  carvedilol (COREG) 6.25 MG tablet Take 1 tablet (6.25 mg total) by mouth 2 (two) times daily with a meal. 08/21/16  Yes Antonieta Iba, MD  furosemide (LASIX) 40 MG tablet Take 1.5 tablets (60 mg total) by mouth 2 (two) times daily. 08/21/16  Yes Antonieta Iba, MD  lisinopril (PRINIVIL,ZESTRIL) 20 MG tablet Take 20 mg by mouth daily.   Yes Historical Provider, MD  nitroGLYCERIN (NITROSTAT) 0.4 MG SL tablet Place 1 tablet (0.4 mg total) under the tongue every 5 (five) minutes as needed for chest pain. 06/04/16  Yes Enid Baas, MD  omega-3 acid ethyl esters (LOVAZA) 1 g capsule Take 1 capsule by mouth 2 (two) times daily.   Yes Historical Provider, MD  ondansetron (ZOFRAN) 4 MG tablet Take 1 tablet (4 mg total) by mouth daily as needed for nausea or vomiting. 04/30/16  Yes Emily Filbert, MD  pantoprazole (PROTONIX) 40 MG tablet  Take 1 tablet (40 mg total) by mouth daily. 05/24/16 05/24/17 Yes Darren Servando Snare, MD  PARoxetine (PAXIL) 30 MG tablet Take 1 tablet (30 mg total) by mouth daily. 06/04/16  Yes Enid Baas, MD  potassium chloride (K-DUR) 10 MEQ tablet Take 1 tablet (10 mEq total) by mouth 2 (two) times daily. 08/21/16 08/21/17 Yes Antonieta Iba, MD  prazosin (MINIPRESS) 1 MG capsule Take 1 mg by mouth at bedtime.   Yes Historical Provider, MD  QUEtiapine (SEROQUEL) 300 MG tablet Take 1 tablet (300 mg total) by mouth at bedtime. 06/04/16  Yes Enid Baas, MD  spironolactone (ALDACTONE) 25 MG tablet Take 1 tablet (25 mg total) by  mouth daily. 08/21/16  Yes Antonieta Iba, MD  traMADol (ULTRAM) 50 MG tablet Take 1 tablet (50 mg total) by mouth every 8 (eight) hours as needed for moderate pain or severe pain. 06/04/16  Yes Enid Baas, MD    Allergies Patient has no known allergies.  Family History  Problem Relation Age of Onset  . Hypertension Mother   . Hyperlipidemia Mother     Social History Social History  Substance Use Topics  . Smoking status: Former Games developer  . Smokeless tobacco: Never Used  . Alcohol use No    Review of Systems \\Constitutional : No fever/chills Eyes: No visual changes. ENT: No sore throat. No stiff neck no neck pain Cardiovascular: See history of present illness Respiratory: See history of present illness Gastrointestinal:   History of present illness Genitourinary: He history of present illness Musculoskeletal: See history of present illness Skin: Negative for rash. Neurological: Negative for severe headaches, focal weakness or numbness. 10-point ROS otherwise negative.  ____________________________________________   PHYSICAL EXAM:  VITAL SIGNS: ED Triage Vitals  Enc Vitals Group     BP 09/10/16 0030 (!) 130/108     Pulse Rate 09/09/16 2249 77     Resp 09/09/16 2249 16     Temp 09/09/16 2249 97.8 F (36.6 C)     Temp Source 09/09/16 2249 Oral     SpO2 09/09/16 2249 96 %     Weight --      Height --      Head Circumference --      Peak Flow --      Pain Score 09/09/16 2250 8     Pain Loc --      Pain Edu? --      Excl. in GC? --     Constitutional: Alert and oriented. Well appearing and in no acute distress. Eyes: Conjunctivae are normal. PERRL. EOMI. Head: Atraumatic. Nose: No congestion/rhinnorhea. Mouth/Throat: Mucous membranes are moist.  Oropharynx non-erythematous. Neck: No stridor.   Nontender with no meningismus Cardiovascular: Normal rate, regular rhythm. Grossly normal heart sounds.  Good peripheral circulation. Respiratory: Normal  respiratory effort.  No retractions. Lungs CTAB. Abdominal: Soft and Mildly diffusely tender positive ascites. No distention. No guarding no rebound Back:  There is no focal tenderness or step off.  there is no midline tenderness there are no lesions noted. there is no CVA tenderness Musculoskeletal: No lower extremity tenderness, no upper extremity tenderness. No joint effusions, no DVT signs strong distal pulses mild bilateral edema Neurologic:  Normal speech and language. No gross focal neurologic deficits are appreciated.  Skin:  Skin is warm, dry and intact. No rash noted. Psychiatric: Mood and affect are normal. Speech and behavior are normal.  ____________________________________________   LABS (all labs ordered are listed, but only abnormal results are displayed)  Labs  Reviewed  COMPREHENSIVE METABOLIC PANEL - Abnormal; Notable for the following:       Result Value   Glucose, Bld 214 (*)    BUN 24 (*)    Creatinine, Ser 1.13 (*)    Calcium 8.7 (*)    Albumin 3.3 (*)    Total Bilirubin 2.4 (*)    GFR calc non Af Amer 55 (*)    All other components within normal limits  CBC - Abnormal; Notable for the following:    RBC 3.78 (*)    Hemoglobin 10.8 (*)    HCT 32.6 (*)    RDW 20.0 (*)    All other components within normal limits  URINALYSIS, COMPLETE (UACMP) WITH MICROSCOPIC - Abnormal; Notable for the following:    Color, Urine YELLOW (*)    APPearance CLEAR (*)    Protein, ur 30 (*)    Bacteria, UA RARE (*)    Squamous Epithelial / LPF 0-5 (*)    All other components within normal limits  TROPONIN I - Abnormal; Notable for the following:    Troponin I 0.04 (*)    All other components within normal limits  LIPASE, BLOOD   ____________________________________________  EKG  I personally interpreted any EKGs ordered by me or triage Rate 77 beats per an acute ST elevation or acute ST depression, nonspecific ST changes, normal  axis ____________________________________________  RADIOLOGY  I reviewed any imaging ordered by me or triage that were performed during my shift and, if possible, patient and/or family made aware of any abnormal findings. ____________________________________________   PROCEDURES  Procedure(s) performed: None  Procedures  Critical Care performed: None  ____________________________________________   INITIAL IMPRESSION / ASSESSMENT AND PLAN / ED COURSE  Pertinent labs & imaging results that were available during my care of the patient were reviewed by me and considered in my medical decision making (see chart for details).  Patient with a host of different complaints. Most concerning is her near syncope and her shortness of breath. Patient does have a history of CHF. Chest x-ray was clear but CT scan does show evidence of CHF. This could be why the patient is having her symptoms. Troponin is borderline, she has an extensive cardiac history. Given presyncope, and multiple other complaints in addition to shortness of breath I think the patient would likely benefit from observation. Considered the possibility of an SBP, however, no fever no white count, we'll defer antibiotics. Sometimes the patient gets more short of breath when her ascites backs up and this could certainly be contributing, she may need to be tapped as inpatient    ____________________________________________   FINAL CLINICAL IMPRESSION(S) / ED DIAGNOSES  Final diagnoses:  None      This chart was dictated using voice recognition software.  Despite best efforts to proofread,  errors can occur which can change meaning.      Jeanmarie Plant, MD 09/10/16 [phone]

## 2016-09-10 NOTE — ED Notes (Signed)
Dr Alphonzo Lemmings notified of troponin 0.04.

## 2016-09-10 NOTE — ED Notes (Signed)
Return from CT Scan.  AAOx3.  Skin warm and dry. NAD 

## 2016-09-11 ENCOUNTER — Telehealth: Payer: Self-pay | Admitting: Cardiology

## 2016-09-11 ENCOUNTER — Ambulatory Visit (INDEPENDENT_AMBULATORY_CARE_PROVIDER_SITE_OTHER): Payer: Medicare Other | Admitting: *Deleted

## 2016-09-11 DIAGNOSIS — I428 Other cardiomyopathies: Secondary | ICD-10-CM

## 2016-09-11 LAB — CBC
HCT: 30.1 % — ABNORMAL LOW (ref 35.0–47.0)
Hemoglobin: 9.6 g/dL — ABNORMAL LOW (ref 12.0–16.0)
MCH: 27.4 pg (ref 26.0–34.0)
MCHC: 31.9 g/dL — ABNORMAL LOW (ref 32.0–36.0)
MCV: 86 fL (ref 80.0–100.0)
PLATELETS: 243 10*3/uL (ref 150–440)
RBC: 3.5 MIL/uL — AB (ref 3.80–5.20)
RDW: 20.2 % — AB (ref 11.5–14.5)
WBC: 5 10*3/uL (ref 3.6–11.0)

## 2016-09-11 LAB — GLUCOSE, CAPILLARY
GLUCOSE-CAPILLARY: 84 mg/dL (ref 65–99)
Glucose-Capillary: 194 mg/dL — ABNORMAL HIGH (ref 65–99)
Glucose-Capillary: 164 mg/dL — ABNORMAL HIGH (ref 65–99)

## 2016-09-11 LAB — BASIC METABOLIC PANEL
Anion gap: 7 (ref 5–15)
BUN: 22 mg/dL — AB (ref 6–20)
CALCIUM: 8.7 mg/dL — AB (ref 8.9–10.3)
CO2: 24 mmol/L (ref 22–32)
CREATININE: 1.14 mg/dL — AB (ref 0.44–1.00)
Chloride: 107 mmol/L (ref 101–111)
GFR calc Af Amer: >60 mL/min (ref >60)
GFR, EST NON AFRICAN AMERICAN: 54 mL/min — AB (ref >60)
GLUCOSE: 95 mg/dL (ref 65–99)
Potassium: 3.3 mmol/L — ABNORMAL LOW (ref 3.5–5.1)
SODIUM: 138 mmol/L (ref 135–145)

## 2016-09-11 LAB — TRIGLYCERIDES, BODY FLUIDS: Triglycerides, Fluid: 49 mg/dL

## 2016-09-11 LAB — PATHOLOGIST SMEAR REVIEW

## 2016-09-11 MED ORDER — POTASSIUM CHLORIDE CRYS ER 20 MEQ PO TBCR
40.0000 meq | EXTENDED_RELEASE_TABLET | Freq: Once | ORAL | Status: AC
  Administered 2016-09-11: 40 meq via ORAL

## 2016-09-11 MED ORDER — LISINOPRIL 20 MG PO TABS: 10.0000 mg | ORAL_TABLET | Freq: Every day | ORAL | Status: DC

## 2016-09-11 NOTE — Care Management Important Message (Signed)
Important Message  Patient Details  Name: Bonnie Ochoa MRN: [id number] Date of Birth: Mar 06, 1966   Medicare Important Message Given:  Yes  Initial signed IM printed from Epic and given to patient.     Eber Hong, RN 09/11/2016, 9:21 AM

## 2016-09-11 NOTE — Care Management (Signed)
Confirmed that patient has medicare and medicaid. She does not have pcp.  Provided list of physicians taking new patients. She would like to have an appointment made with Bonnie Ochoa at [facility].  She says her cpap machine has been broken for 3-4 years.  Instructed her to take it to [facility] on [address] to determine if it can be fixed.  If not- will have to have another sleep study to qualify.  She denies issues with transportation. A room mate will transfer her home.  She has an appointment with the heart failure clinic

## 2016-09-11 NOTE — Plan of Care (Signed)
Problem: Safety: Goal: Ability to remain free from injury will improve Outcome: Progressing Pt up to Bathroom with standby assist, tolerated well. Non skid socks in place along with bed alarm.  Problem: Pain Managment: Goal: General experience of comfort will improve Outcome: Progressing Pt complained of pain once, treated with 1 Vicodin, pt had relief. CPAP worn, pt slept well. Will continue to monitor. Shirley Friar, RN, BSN

## 2016-09-11 NOTE — Discharge Instructions (Addendum)
Heart Failure Clinic appointment on September 19, 2016 at 9:00am with Clarisa Kindred, FNP. Please call [phone] to reschedule.    [facility] [place] [facility]  DIET:  Cardiac diet  DISCHARGE CONDITION:  Stable  ACTIVITY:  Activity as tolerated  OXYGEN:  Home Oxygen: No.   Oxygen Delivery: room air  DISCHARGE LOCATION:  home    ADDITIONAL DISCHARGE INSTRUCTION: cpap qhs    If you experience worsening of your admission symptoms, develop shortness of breath, life threatening emergency, suicidal or homicidal thoughts you must seek medical attention immediately by calling 911 or calling your MD immediately  if symptoms less severe.  You Must read complete instructions/literature along with all the possible adverse reactions/side effects for all the Medicines you take and that have been prescribed to you. Take any new Medicines after you have completely understood and accpet all the possible adverse reactions/side effects.   Please note  You were cared for by a hospitalist during your hospital stay. If you have any questions about your discharge medications or the care you received while you were in the hospital after you are discharged, you can call the unit and asked to speak with the hospitalist on call if the hospitalist that took care of you is not available. Once you are discharged, your primary care physician will handle any further medical issues. Please note that NO REFILLS for any discharge medications will be authorized once you are discharged, as it is imperative that you return to your primary care physician (or establish a relationship with a primary care physician if you do not have one) for your aftercare needs so that they can reassess your need for medications and monitor your lab values.

## 2016-09-11 NOTE — Telephone Encounter (Signed)
Confirmed remote transmission w/ pt.   

## 2016-09-11 NOTE — Discharge Summary (Signed)
[facility] - [place] at [facility], [place] y.o., DOB 1966/07/08, MRN [id number]. Admission date: 09/10/2016 Discharge Date 09/11/2016 Primary MD No PCP Per Patient Admitting Physician Auburn Bilberry, MD  Admission Diagnosis  SOB (shortness of breath) [R06.02] Other ascites [R18.8] Ascites [R18.8] Postural dizziness with presyncope [R42, R55]  Discharge Diagnosis   Active Problems:   Acute on chronic CHF (congestive heart failure) (HCC) Abdominal pain due to worsening ascites now resolved Diarrhea due to gastroenteritis now resolved Diabetes type II Liver cirrhosis Essential hypertension        Hospital Course  Bonnie Ochoa  is a 51 y.o. female with a known history of Asthma, bipolar 1 disorder, chronic systolic CHF, liver cirrhosis, coronary artery disease, depression, diabetes type 2 and hypertension who is presenting to the ED with having multiple complaints. She complains of chest pain shortness of breath abdominal pain dysuria or urinary frequency and syncope 2 with lightheadedness. She's been having these symptoms for the last 3 days. She has not had any fevers chills. No recent antibiotics. She reports that she has no primary care provider. Patient's chest x-ray showed findings consistent with CHF. CT of the abdomen showed worsening ascites. As well as bowel edema. Due to this she was admitted to the hospital she underwent a paracentesis with removal of the fluid from her ascites. As well as she was diuresed for her CHF. This morning she is feeling much better shortness of breath is resolved. Abdominal pain resolved.           Consults  None  Significant Tests:  See full reports for all details     Dg Chest 2 View  Result Date: 09/10/2016 CLINICAL DATA:  Abdominal pain, onset yesterday. Fell in the shower. EXAM: CHEST  2 VIEW COMPARISON:  07/31/2016 FINDINGS: Unchanged moderate cardiomegaly. Intact appearances of the transvenous cardiac  lead. The lungs are clear except for unchanged curvilinear scarring in the left lung laterally. The pulmonary vasculature is normal. There is no pleural effusion. Hilar and mediastinal contours are unremarkable and unchanged. IMPRESSION: Stable cardiomegaly.  No acute cardiopulmonary findings. Electronically Signed   By: Ellery Plunk M.D.   On: 09/10/2016 00:58   Ct Abdomen Pelvis W Contrast  Result Date: 09/10/2016 CLINICAL DATA:  Initial evaluation for acute abdominal pain. History of cirrhosis, CHF. EXAM: CT ABDOMEN AND PELVIS WITH CONTRAST TECHNIQUE: Multidetector CT imaging of the abdomen and pelvis was performed using the standard protocol following bolus administration of intravenous contrast. CONTRAST:  ISOVUE-300 IOPAMIDOL (ISOVUE-300) INJECTION 61%, 51mL ISOVUE-300 IOPAMIDOL (ISOVUE-300) INJECTION 61% COMPARISON:  Prior CT from 05/30/2016. FINDINGS: Lower chest: Cardiomegaly partially visualized. Reflux of contrast into the IVC and hepatic veins suggest right heart dysfunction. No pleural or pericardial fusion. No evidence for pulmonary edema within the visualized lung bases. Mild left basilar atelectasis/scarring. Hepatobiliary: Heterogeneous enhancement of the liver with nutmeg appearance, compatible with congestive hepatopathy. Mild irregularity of the hepatic contour or suggest underlying cirrhosis. Overall, changes are similar to previous. Pericholecystic free fluid likely related to overall volume status/ascites. No biliary dilatation. Pancreas: Pancreas within normal limits. Spleen: Spleen within normal limits. Adrenals/Urinary Tract: Adrenal glands unremarkable. Kidneys equal in size with symmetric enhancement. No nephrolithiasis, hydronephrosis, or focal enhancing renal mass. No hydroureter. Bladder partially distended without abnormality. Stomach/Bowel: Small hiatal hernia. Stomach otherwise unremarkable. No evidence for bowel obstruction. No acute inflammatory changes seen about  the small bowel. Mild circumferential wall thickening about the distal colon, which may be related to underlying  hepatic disease, although acute proctocolitis could also be considered. No other significant inflammatory changes about the bowels. Moderate amount of retained stool within the proximal colon. Appendix is normal. Vascular/Lymphatic: Normal intravascular enhancement seen throughout the abdomen and pelvis. Mildly prominent left inguinal lymph nodes measure up to 13 mm (series 2, image 86), indeterminate. This is increased from prior. No other significant adenopathy identified. Reproductive: Uterus is absent.  Ovaries stable in appearance. Other: No free air.  Moderate volume ascites. Musculoskeletal: Diffuse anasarca. Prominent degenerative spondylolysis about L4-5. No acute osseous abnormality. No worrisome lytic or blastic osseous lesions. IMPRESSION: 1. Cardiomegaly with evidence for right heart dysfunction. Associated congestive hepatopathy with possible mild underlying cirrhosis, similar to previous. 2. Moderate volume ascites with diffuse anasarca. 3. Circumferential wall thickening about the distal colon with question of hazy inflammatory stranding. While this finding may in part be related underlying intrinsic liver disease, possible acute proctocolitis could also be considered. 4. Mildly prominent left inguinal lymph nodes measuring up to 13 mm, indeterminate. No other adenopathy identified. Electronically Signed   By: Rise Mu M.D.   On: 09/10/2016 04:13   US Paracentesis  Result Date: 09/10/2016 INDICATION: Ascites EXAM: ULTRASOUND GUIDED DIAGNOSTIC PARACENTESIS MEDICATIONS: None. COMPLICATIONS: None immediate. PROCEDURE: Informed written consent was obtained from the patient after a discussion of the risks, benefits and alternatives to treatment. A timeout was performed prior to the initiation of the procedure. Initial ultrasound scanning demonstrates a moderate amount of ascites  within the abdomen . The right mid abdomen was prepped and draped in the usual sterile fashion. 1% lidocaine with epinephrine was used for local anesthesia. Following this, a 19 gauge, 7-cm, Yueh catheter was introduced. An ultrasound image was saved for documentation purposes. The paracentesis was performed. The catheter was removed and a dressing was applied. The patient tolerated the procedure well without immediate post procedural complication. FINDINGS: A total of approximately 120 mL of clear yellow fluid was removed. Samples were sent to the laboratory as requested by the clinical team. IMPRESSION: Successful ultrasound-guided diagnostic paracentesis. Electronically Signed   By: Alcide Clever M.D.   On: 09/10/2016 10:34       Today   Subjective:   Bonnie Ochoa  feeling better shortness of breath resolved abdominal pain resolved  Objective:   Blood pressure 106/76, pulse 74, temperature 97.6 F (36.4 C), temperature source Oral, resp. rate 16, height 5\' 5"  (1.651 m), weight 178 lb 9.6 oz (81 kg), SpO2 94 %.  .  Intake/Output Summary (Last 24 hours) at 09/11/16 1610 Last data filed at 09/11/16 1030  Gross per 24 hour  Intake              516 ml  Output             1800 ml  Net            -1284 ml    Exam VITAL SIGNS: Blood pressure 106/76, pulse 74, temperature 97.6 F (36.4 C), temperature source Oral, resp. rate 16, height 5\' 5"  (1.651 m), weight 178 lb 9.6 oz (81 kg), SpO2 94 %.  GENERAL:  51 y.o.-year-old patient lying in the bed with no acute distress.  EYES: Pupils equal, round, reactive to light and accommodation. No scleral icterus. Extraocular muscles intact.  HEENT: Head atraumatic, normocephalic. Oropharynx and nasopharynx clear.  NECK:  Supple, no jugular venous distention. No thyroid enlargement, no tenderness.  LUNGS: Normal breath sounds bilaterally, no wheezing, rales,rhonchi or crepitation. No use of accessory muscles of  respiration.  CARDIOVASCULAR: S1, S2  normal. No murmurs, rubs, or gallops.  ABDOMEN: Soft, nontender, nondistended. Bowel sounds present. No organomegaly or mass.  EXTREMITIES: No pedal edema, cyanosis, or clubbing.  NEUROLOGIC: Cranial nerves II through XII are intact. Muscle strength 5/5 in all extremities. Sensation intact. Gait not checked.  PSYCHIATRIC: The patient is alert and oriented x 3.  SKIN: No obvious rash, lesion, or ulcer.   Data Review     CBC w Diff: Lab Results  Component Value Date   WBC 5.0 09/11/2016   HGB 9.6 (L) 09/11/2016   HCT 30.1 (L) 09/11/2016   PLT 243 09/11/2016   LYMPHOPCT 21 07/31/2016   MONOPCT 6 07/31/2016   EOSPCT 1 07/31/2016   BASOPCT 1 07/31/2016   CMP: Lab Results  Component Value Date   NA 138 09/11/2016   NA 142 06/28/2016   K 3.3 (L) 09/11/2016   CL 107 09/11/2016   CO2 24 09/11/2016   BUN 22 (H) 09/11/2016   BUN 11 06/28/2016   CREATININE 1.14 (H) 09/11/2016   PROT 7.8 09/09/2016   ALBUMIN 3.3 (L) 09/09/2016   BILITOT 2.4 (H) 09/09/2016   ALKPHOS 84 09/09/2016   AST 27 09/09/2016   ALT 26 09/09/2016  .  Micro Results Recent Results (from the past 240 hour(s))  Body fluid culture     Status: None (Preliminary result)   Collection Time: 09/10/16 10:00 AM  Result Value Ref Range Status   Specimen Description PERITONEAL  Final   Special Requests NONE  Final   Gram Stain   Final    RARE WBC PRESENT, PREDOMINANTLY MONONUCLEAR RARE WBC PRESENT, PREDOMINANTLY PMN NO ORGANISMS SEEN    Culture   Final    NO GROWTH < 24 HOURS Performed at [facility], [address] [place], [place] [address]    Report Status PENDING  Incomplete        Code Status Orders        Start     Ordered   09/10/16 1106  Full code  Continuous     09/10/16 1105    Code Status History    Date Active Date Inactive Code Status Order ID Comments User Context   05/30/2016 11:29 AM 06/04/2016 10:26 PM Full Code [id number]  Enedina Finner, MD Inpatient          Follow-up  Information    KHAN,SAADAT A, MD Follow up in 1 week(s).   Specialty:  Internal Medicine Why:  Monday, February 12th at 1030am, ccs Contact information: [address] [place] [address] [phone]        Wyline Mood, MD Follow up in 2 week(s).   Specialty:  Surgery Why:  Wednesday, February 14th at 1pm at [employer] on the [facility], [employer] information: [address] [place] [address] [phone]        Delma Freeze, FNP. Go on 09/19/2016.   Specialty:  Family Medicine Why:  at 9:00am to the [facility] Contact information: [address] [place] [address] [phone]        Pasty Spillers McLean-Scocuzza, MD Follow up.   Specialty:  Internal Medicine Why:  Tuesday, February 6th at 930am, please take picture ID, insurance card and any medications you are currently taking including supplements, ccs  Correct Office Phone is [phone] Contact information: [address] [place] [address] [phone]        Pasty Spillers McLean-Scocuzza, MD Follow up.  Specialty:  Internal Medicine Contact information: [address] [place] [address] [phone]        [facility] Follow up.   Why:  Take you cpap machine to see if they can repair your cpap Machine.If can not be repaired will need to have your physician schedule a sleep study Contact information: [address] [place], [place] [address]  [phone]          Discharge Medications   Allergies as of 09/11/2016   No Known Allergies     Medication List    TAKE these medications   aspirin EC 81 MG tablet Take 1 tablet (81 mg total) by mouth daily.   atorvastatin 40 MG tablet Commonly known as:  LIPITOR Take 1 tablet (40 mg total) by mouth daily.   carvedilol 6.25 MG tablet Commonly known as:  COREG Take 1 tablet (6.25 mg total) by mouth 2 (two) times daily with a meal.   furosemide 40 MG tablet Commonly known as:  LASIX Take 1.5  tablets (60 mg total) by mouth 2 (two) times daily.   lisinopril 20 MG tablet Commonly known as:  PRINIVIL,ZESTRIL Take 0.5 tablets (10 mg total) by mouth daily. What changed:  how much to take   nitroGLYCERIN 0.4 MG SL tablet Commonly known as:  NITROSTAT Place 1 tablet (0.4 mg total) under the tongue every 5 (five) minutes as needed for chest pain.   omega-3 acid ethyl esters 1 g capsule Commonly known as:  LOVAZA Take 1 capsule by mouth 2 (two) times daily.   ondansetron 4 MG tablet Commonly known as:  ZOFRAN Take 1 tablet (4 mg total) by mouth daily as needed for nausea or vomiting.   pantoprazole 40 MG tablet Commonly known as:  PROTONIX Take 1 tablet (40 mg total) by mouth daily.   PARoxetine 30 MG tablet Commonly known as:  PAXIL Take 1 tablet (30 mg total) by mouth daily.   potassium chloride 10 MEQ tablet Commonly known as:  K-DUR Take 1 tablet (10 mEq total) by mouth 2 (two) times daily.   prazosin 1 MG capsule Commonly known as:  MINIPRESS Take 1 mg by mouth at bedtime.   QUEtiapine 300 MG tablet Commonly known as:  SEROQUEL Take 1 tablet (300 mg total) by mouth at bedtime.   spironolactone 25 MG tablet Commonly known as:  ALDACTONE Take 1 tablet (25 mg total) by mouth daily.   traMADol 50 MG tablet Commonly known as:  ULTRAM Take 1 tablet (50 mg total) by mouth every 8 (eight) hours as needed for moderate pain or severe pain.          Total Time in preparing paper work, data evaluation and todays exam - 35 minutes  Auburn Bilberry M.D on 09/11/2016 at 4:10 PM  [facility]   Office  [phone]

## 2016-09-11 NOTE — Progress Notes (Signed)
Initial HF Clinic appointment scheduled for September 19, 2016 at 9:00am. Thank you.

## 2016-09-13 LAB — BODY FLUID CULTURE: CULTURE: NO GROWTH

## 2016-09-17 ENCOUNTER — Inpatient Hospital Stay
Admission: EM | Admit: 2016-09-17 | Discharge: 2016-09-18 | DRG: 292 | Disposition: A | Discharge status: Home / Self Care | Payer: Medicare Other | Attending: Internal Medicine | Admitting: Internal Medicine

## 2016-09-17 ENCOUNTER — Emergency Department: Payer: Medicare Other

## 2016-09-17 DIAGNOSIS — Z9119 Patient's noncompliance with other medical treatment and regimen: Secondary | ICD-10-CM | POA: Diagnosis not present

## 2016-09-17 DIAGNOSIS — Z90710 Acquired absence of both cervix and uterus: Secondary | ICD-10-CM

## 2016-09-17 DIAGNOSIS — I160 Hypertensive urgency: Secondary | ICD-10-CM | POA: Diagnosis present

## 2016-09-17 DIAGNOSIS — Z87891 Personal history of nicotine dependence: Secondary | ICD-10-CM

## 2016-09-17 DIAGNOSIS — I5043 Acute on chronic combined systolic (congestive) and diastolic (congestive) heart failure: Secondary | ICD-10-CM | POA: Diagnosis present

## 2016-09-17 DIAGNOSIS — Z9111 Patient's noncompliance with dietary regimen: Secondary | ICD-10-CM

## 2016-09-17 DIAGNOSIS — I119 Hypertensive heart disease without heart failure: Secondary | ICD-10-CM

## 2016-09-17 DIAGNOSIS — F319 Bipolar disorder, unspecified: Secondary | ICD-10-CM | POA: Diagnosis present

## 2016-09-17 DIAGNOSIS — I5023 Acute on chronic systolic (congestive) heart failure: Secondary | ICD-10-CM | POA: Diagnosis present

## 2016-09-17 DIAGNOSIS — I110 Hypertensive heart disease with heart failure: Principal | ICD-10-CM | POA: Diagnosis present

## 2016-09-17 DIAGNOSIS — Z8249 Family history of ischemic heart disease and other diseases of the circulatory system: Secondary | ICD-10-CM

## 2016-09-17 DIAGNOSIS — E876 Hypokalemia: Secondary | ICD-10-CM | POA: Diagnosis present

## 2016-09-17 DIAGNOSIS — J45909 Unspecified asthma, uncomplicated: Secondary | ICD-10-CM | POA: Diagnosis present

## 2016-09-17 DIAGNOSIS — Z91199 Patient's noncompliance with other medical treatment and regimen due to unspecified reason: Secondary | ICD-10-CM

## 2016-09-17 DIAGNOSIS — I2510 Atherosclerotic heart disease of native coronary artery without angina pectoris: Secondary | ICD-10-CM | POA: Diagnosis present

## 2016-09-17 DIAGNOSIS — Z7982 Long term (current) use of aspirin: Secondary | ICD-10-CM | POA: Diagnosis not present

## 2016-09-17 DIAGNOSIS — E119 Type 2 diabetes mellitus without complications: Secondary | ICD-10-CM | POA: Diagnosis present

## 2016-09-17 DIAGNOSIS — Z79899 Other long term (current) drug therapy: Secondary | ICD-10-CM | POA: Diagnosis not present

## 2016-09-17 DIAGNOSIS — D638 Anemia in other chronic diseases classified elsewhere: Secondary | ICD-10-CM | POA: Diagnosis present

## 2016-09-17 DIAGNOSIS — Z91148 Patient's other noncompliance with medication regimen for other reason: Secondary | ICD-10-CM

## 2016-09-17 DIAGNOSIS — I272 Pulmonary hypertension, unspecified: Secondary | ICD-10-CM | POA: Diagnosis present

## 2016-09-17 DIAGNOSIS — R079 Chest pain, unspecified: Secondary | ICD-10-CM | POA: Diagnosis not present

## 2016-09-17 DIAGNOSIS — Z95810 Presence of automatic (implantable) cardiac defibrillator: Secondary | ICD-10-CM

## 2016-09-17 DIAGNOSIS — R188 Other ascites: Secondary | ICD-10-CM | POA: Diagnosis present

## 2016-09-17 DIAGNOSIS — I161 Hypertensive emergency: Secondary | ICD-10-CM | POA: Diagnosis not present

## 2016-09-17 DIAGNOSIS — K7460 Unspecified cirrhosis of liver: Secondary | ICD-10-CM | POA: Diagnosis present

## 2016-09-17 DIAGNOSIS — R0602 Shortness of breath: Secondary | ICD-10-CM | POA: Diagnosis not present

## 2016-09-17 DIAGNOSIS — I428 Other cardiomyopathies: Secondary | ICD-10-CM | POA: Diagnosis not present

## 2016-09-17 DIAGNOSIS — Z9114 Patient's other noncompliance with medication regimen: Secondary | ICD-10-CM | POA: Diagnosis not present

## 2016-09-17 DIAGNOSIS — G4730 Sleep apnea, unspecified: Secondary | ICD-10-CM | POA: Diagnosis present

## 2016-09-17 DIAGNOSIS — I429 Cardiomyopathy, unspecified: Secondary | ICD-10-CM | POA: Diagnosis not present

## 2016-09-17 DIAGNOSIS — N179 Acute kidney failure, unspecified: Secondary | ICD-10-CM | POA: Diagnosis present

## 2016-09-17 HISTORY — DX: Sleep apnea, unspecified: G47.30

## 2016-09-17 LAB — URINALYSIS, ROUTINE W REFLEX MICROSCOPIC
BACTERIA UA: NONE SEEN
BILIRUBIN URINE: NEGATIVE
Glucose, UA: NEGATIVE mg/dL
HGB URINE DIPSTICK: NEGATIVE
KETONES UR: NEGATIVE mg/dL
LEUKOCYTES UA: NEGATIVE
Nitrite: NEGATIVE
PROTEIN: 30 mg/dL — AB
RBC / HPF: NONE SEEN RBC/hpf (ref 0–5)
Specific Gravity, Urine: 1.012 (ref 1.005–1.030)
pH: 6 (ref 5.0–8.0)

## 2016-09-17 LAB — HEPATIC FUNCTION PANEL
ALBUMIN: 3.8 g/dL (ref 3.5–5.0)
ALK PHOS: 85 U/L (ref 38–126)
ALT: 18 U/L (ref 14–54)
AST: 25 U/L (ref 15–41)
Bilirubin, Direct: 1.2 mg/dL — ABNORMAL HIGH (ref 0.1–0.5)
Indirect Bilirubin: 3.1 mg/dL — ABNORMAL HIGH (ref 0.3–0.9)
TOTAL PROTEIN: 8.5 g/dL — AB (ref 6.5–8.1)
Total Bilirubin: 4.3 mg/dL — ABNORMAL HIGH (ref 0.3–1.2)

## 2016-09-17 LAB — BASIC METABOLIC PANEL
ANION GAP: 12 (ref 5–15)
BUN: 24 mg/dL — ABNORMAL HIGH (ref 6–20)
CO2: 23 mmol/L (ref 22–32)
Calcium: 9.3 mg/dL (ref 8.9–10.3)
Chloride: 103 mmol/L (ref 101–111)
Creatinine, Ser: 1.2 mg/dL — ABNORMAL HIGH (ref 0.44–1.00)
GFR, EST AFRICAN AMERICAN: 59 mL/min — AB (ref >60)
GFR, EST NON AFRICAN AMERICAN: 51 mL/min — AB (ref >60)
Glucose, Bld: 139 mg/dL — ABNORMAL HIGH (ref 65–99)
POTASSIUM: 3.4 mmol/L — AB (ref 3.5–5.1)
Sodium: 138 mmol/L (ref 135–145)

## 2016-09-17 LAB — URINE DRUG SCREEN, QUALITATIVE (ARMC ONLY)
AMPHETAMINES, UR SCREEN: NOT DETECTED
Barbiturates, Ur Screen: NOT DETECTED
Benzodiazepine, Ur Scrn: NOT DETECTED
COCAINE METABOLITE, UR ~~LOC~~: NOT DETECTED
Cannabinoid 50 Ng, Ur ~~LOC~~: POSITIVE — AB
MDMA (ECSTASY) UR SCREEN: NOT DETECTED
METHADONE SCREEN, URINE: NOT DETECTED
OPIATE, UR SCREEN: NOT DETECTED
Phencyclidine (PCP) Ur S: NOT DETECTED
TRICYCLIC, UR SCREEN: NOT DETECTED

## 2016-09-17 LAB — BRAIN NATRIURETIC PEPTIDE: B Natriuretic Peptide: >4500 pg/mL — ABNORMAL HIGH (ref 0.0–100.0)

## 2016-09-17 LAB — CBC
HEMATOCRIT: 33.7 % — AB (ref 35.0–47.0)
HEMOGLOBIN: 10.5 g/dL — AB (ref 12.0–16.0)
MCH: 26.7 pg (ref 26.0–34.0)
MCHC: 31.3 g/dL — ABNORMAL LOW (ref 32.0–36.0)
MCV: 85.4 fL (ref 80.0–100.0)
Platelets: 277 10*3/uL (ref 150–440)
RBC: 3.94 MIL/uL (ref 3.80–5.20)
RDW: 20.6 % — ABNORMAL HIGH (ref 11.5–14.5)
WBC: 7.8 10*3/uL (ref 3.6–11.0)

## 2016-09-17 LAB — GLUCOSE, CAPILLARY
GLUCOSE-CAPILLARY: 142 mg/dL — AB (ref 65–99)
Glucose-Capillary: 216 mg/dL — ABNORMAL HIGH (ref 65–99)

## 2016-09-17 LAB — TROPONIN I: Troponin I: 0.06 ng/mL (ref <0.03)

## 2016-09-17 LAB — PROTIME-INR
INR: 1.27
Prothrombin Time: 16 seconds — ABNORMAL HIGH (ref 11.4–15.2)

## 2016-09-17 LAB — LIPASE, BLOOD: Lipase: 30 U/L (ref 11–51)

## 2016-09-17 MED ORDER — ENOXAPARIN SODIUM 40 MG/0.4ML ~~LOC~~ SOLN
40.0000 mg | SUBCUTANEOUS | Status: DC
  Administered 2016-09-17: 40 mg via SUBCUTANEOUS
  Filled 2016-09-17: qty 0.4

## 2016-09-17 MED ORDER — SPIRONOLACTONE 25 MG PO TABS
25.0000 mg | ORAL_TABLET | Freq: Every day | ORAL | Status: DC
  Filled 2016-09-17: qty 1

## 2016-09-17 MED ORDER — CARVEDILOL 6.25 MG PO TABS: 6.2500 mg | ORAL_TABLET | Freq: Two times a day (BID) | ORAL | Status: DC

## 2016-09-17 MED ORDER — OMEGA-3-ACID ETHYL ESTERS 1 G PO CAPS
1.0000 | ORAL_CAPSULE | Freq: Two times a day (BID) | ORAL | Status: DC
  Administered 2016-09-17 – 2016-09-18 (×2): 1 g via ORAL
  Filled 2016-09-17 (×2): qty 1

## 2016-09-17 MED ORDER — LISINOPRIL 10 MG PO TABS
10.0000 mg | ORAL_TABLET | Freq: Every day | ORAL | Status: DC
  Administered 2016-09-17 – 2016-09-18 (×2): 10 mg via ORAL
  Filled 2016-09-17 (×2): qty 1

## 2016-09-17 MED ORDER — PRAZOSIN HCL 1 MG PO CAPS
1.0000 mg | ORAL_CAPSULE | Freq: Every day | ORAL | Status: DC
  Administered 2016-09-17: 1 mg via ORAL
  Filled 2016-09-17: qty 1

## 2016-09-17 MED ORDER — POTASSIUM CHLORIDE CRYS ER 10 MEQ PO TBCR
10.0000 meq | EXTENDED_RELEASE_TABLET | Freq: Two times a day (BID) | ORAL | Status: DC
  Administered 2016-09-17: 10 meq via ORAL
  Filled 2016-09-17 (×3): qty 1

## 2016-09-17 MED ORDER — INSULIN ASPART 100 UNIT/ML ~~LOC~~ SOLN
0.0000 [IU] | Freq: Three times a day (TID) | SUBCUTANEOUS | Status: DC
  Administered 2016-09-18 (×3): 1 [IU] via SUBCUTANEOUS
  Filled 2016-09-17 (×3): qty 1

## 2016-09-17 MED ORDER — TRAMADOL HCL 50 MG PO TABS
50.0000 mg | ORAL_TABLET | Freq: Three times a day (TID) | ORAL | Status: DC | PRN
  Administered 2016-09-17: 50 mg via ORAL
  Filled 2016-09-17: qty 1

## 2016-09-17 MED ORDER — MORPHINE SULFATE (PF) 4 MG/ML IV SOLN
1.0000 mg | INTRAVENOUS | Status: DC | PRN
  Administered 2016-09-18: 1 mg via INTRAVENOUS
  Filled 2016-09-17: qty 1

## 2016-09-17 MED ORDER — ASPIRIN EC 81 MG PO TBEC
81.0000 mg | DELAYED_RELEASE_TABLET | Freq: Every day | ORAL | Status: DC
  Administered 2016-09-18: 81 mg via ORAL
  Filled 2016-09-17: qty 1

## 2016-09-17 MED ORDER — QUETIAPINE FUMARATE 300 MG PO TABS
300.0000 mg | ORAL_TABLET | Freq: Every day | ORAL | Status: DC
  Administered 2016-09-17: 300 mg via ORAL
  Filled 2016-09-17 (×2): qty 1

## 2016-09-17 MED ORDER — NITROGLYCERIN 0.4 MG SL SUBL: 0.4000 mg | SUBLINGUAL_TABLET | SUBLINGUAL | Status: DC | PRN

## 2016-09-17 MED ORDER — FUROSEMIDE 10 MG/ML IJ SOLN
80.0000 mg | Freq: Once | INTRAMUSCULAR | Status: AC
  Administered 2016-09-17: 80 mg via INTRAVENOUS
  Filled 2016-09-17: qty 8

## 2016-09-17 MED ORDER — LISINOPRIL 20 MG PO TABS
20.0000 mg | ORAL_TABLET | Freq: Once | ORAL | Status: AC
  Administered 2016-09-17: 20 mg via ORAL
  Filled 2016-09-17: qty 1

## 2016-09-17 MED ORDER — FUROSEMIDE 10 MG/ML IJ SOLN
40.0000 mg | Freq: Three times a day (TID) | INTRAMUSCULAR | Status: DC
  Administered 2016-09-17 – 2016-09-18 (×3): 40 mg via INTRAVENOUS
  Filled 2016-09-17 (×3): qty 4

## 2016-09-17 MED ORDER — CARVEDILOL 6.25 MG PO TABS
6.2500 mg | ORAL_TABLET | Freq: Two times a day (BID) | ORAL | Status: DC
  Administered 2016-09-17 – 2016-09-18 (×4): 6.25 mg via ORAL
  Filled 2016-09-17 (×4): qty 1

## 2016-09-17 MED ORDER — INSULIN ASPART 100 UNIT/ML ~~LOC~~ SOLN: 0.0000 [IU] | Freq: Every day | SUBCUTANEOUS | Status: DC

## 2016-09-17 MED ORDER — ASPIRIN 81 MG PO CHEW
324.0000 mg | CHEWABLE_TABLET | Freq: Once | ORAL | Status: AC
Start: 2016-09-17 — End: 2016-09-17
  Administered 2016-09-17: 324 mg via ORAL
  Filled 2016-09-17: qty 4

## 2016-09-17 MED ORDER — ONDANSETRON HCL 4 MG/2ML IJ SOLN
4.0000 mg | Freq: Once | INTRAMUSCULAR | Status: AC
  Administered 2016-09-17: 4 mg via INTRAVENOUS
  Filled 2016-09-17: qty 2

## 2016-09-17 MED ORDER — SPIRONOLACTONE 25 MG PO TABS
25.0000 mg | ORAL_TABLET | Freq: Once | ORAL | Status: AC
  Administered 2016-09-17: 25 mg via ORAL
  Filled 2016-09-17: qty 1

## 2016-09-17 MED ORDER — SPIRONOLACTONE 25 MG PO TABS
25.0000 mg | ORAL_TABLET | Freq: Every day | ORAL | Status: DC
  Administered 2016-09-18: 25 mg via ORAL
  Filled 2016-09-17: qty 1

## 2016-09-17 MED ORDER — PAROXETINE HCL 20 MG PO TABS
30.0000 mg | ORAL_TABLET | Freq: Every day | ORAL | Status: DC
  Administered 2016-09-17 – 2016-09-18 (×2): 30 mg via ORAL
  Filled 2016-09-17 (×2): qty 1

## 2016-09-17 MED ORDER — ATORVASTATIN CALCIUM 20 MG PO TABS
40.0000 mg | ORAL_TABLET | Freq: Every day | ORAL | Status: DC
  Administered 2016-09-17 – 2016-09-18 (×2): 40 mg via ORAL
  Filled 2016-09-17 (×2): qty 2

## 2016-09-17 NOTE — Plan of Care (Signed)
Problem: Safety: Goal: Ability to remain free from injury will improve Outcome: Progressing Fall precautions in place  Problem: Pain Managment: Goal: General experience of comfort will improve Outcome: Progressing Prn medications  Problem: Tissue Perfusion: Goal: Risk factors for ineffective tissue perfusion will decrease Outcome: Progressing SQ Lovenox  Problem: Fluid Volume: Goal: Ability to maintain a balanced intake and output will improve Outcome: Progressing Daily weights, strict I&O

## 2016-09-17 NOTE — ED Provider Notes (Signed)
[facility] Emergency Department Provider Note        Time seen: ----------------------------------------- 6:55 AM on 09/17/2016 -----------------------------------------    I have reviewed the triage vital signs and the nursing notes.   HISTORY  Chief Complaint Chest Pain and Abdominal Pain    HPI Bonnie Ochoa is a 51 y.o. female who presents to the ER by EMS complaining of chest pain and abdominal pain. Patient states she has chest pain that radiates from the right side to the left side. She states she was here last week and had some fluid drained off. She also describes multiple symptoms such as fevers, chills, chest pain, shortness of breath, vomiting and diarrhea. Nothing makes her symptoms better or worse. She reports she does have a history of CHF. Patient reports she has not had any medicine in the last 24 hours. She reports she had run out.   Past Medical History:  Diagnosis Date  . Asthma   . Bipolar 1 disorder (HCC)   . CHF (congestive heart failure) (HCC)   . Cirrhosis of liver (HCC)   . Coronary artery disease   . Depression   . Diabetes mellitus without complication (HCC)   . Hypertension     Patient Active Problem List   Diagnosis Date Noted  . Acute CHF (congestive heart failure) (HCC) 09/10/2016  . Acute on chronic systolic CHF (congestive heart failure) (HCC) 05/30/2016  . Pulmonary hypertension 05/30/2016  . Hypokalemia 05/30/2016  . NICM (nonischemic cardiomyopathy) (HCC) 05/30/2016  . Elevated troponin   . Other ascites   . Hypertension   . Diabetes mellitus without complication (HCC)   . Depression   . Bipolar 1 disorder (HCC)   . Asthma   . Generalized abdominal pain 05/07/2016    Past Surgical History:  Procedure Laterality Date  . ABDOMINAL HYSTERECTOMY    . debribalator  2016  . hysterectmy  2011  . INSERT / REPLACE / REMOVE PACEMAKER    . INSERTION OF ICD    . TONSILECTOMY, ADENOIDECTOMY, BILATERAL  MYRINGOTOMY AND TUBES    . TUBAL LIGATION  1980    Allergies Patient has no known allergies.  Social History Social History  Substance Use Topics  . Smoking status: Former Games developer  . Smokeless tobacco: Never Used  . Alcohol use No    Review of Systems Constitutional: Positive for fever Cardiovascular: Positive for chest pain Respiratory: Positive for shortness of breath Gastrointestinal: Positive for abdominal pain, vomiting and diarrhea Skin: Negative for rash. Neurological: Negative for headaches, positive for generalized weakness  10-point ROS otherwise negative.  ____________________________________________   PHYSICAL EXAM:  VITAL SIGNS: ED Triage Vitals  Enc Vitals Group     BP 09/17/16 0633 (!) 125/99     Pulse Rate 09/17/16 0633 93     Resp 09/17/16 0633 18     Temp 09/17/16 0633 97.7 F (36.5 C)     Temp src --      SpO2 09/17/16 0633 94 %     Weight --      Height 09/17/16 0638 5\' 5"  (1.651 m)     Head Circumference --      Peak Flow --      Pain Score 09/17/16 0638 7     Pain Loc --      Pain Edu? --      Excl. in GC? --     Constitutional: Alert and oriented. No distress Eyes: Conjunctivae are normal. PERRL. Normal extraocular movements. ENT  Head: Normocephalic and atraumatic.   Nose: No congestion/rhinnorhea.   Mouth/Throat: Mucous membranes are moist.   Neck: No stridor. JVD is noted Cardiovascular: Normal rate, regular rhythm. No murmurs, rubs, or gallops. Respiratory: Normal respiratory effort without tachypnea nor retractions. Breath sounds are clear and equal bilaterally. No wheezes/rales/rhonchi. Gastrointestinal: Diffuse, nonfocal tenderness. Normal bowel sounds. Musculoskeletal: normal range of motion in all extremities. Diffuse tenderness with any palpation of the chest or abdomen Neurologic:  Normal speech and language. No gross focal neurologic deficits are appreciated.  Skin:  Skin is warm, dry and intact. No rash  noted. Psychiatric: Depressed mood and affect ____________________________________________  EKG: Interpreted by me. Sinus rhythm rate 93 bpm, normal PR interval, normal QRS, long QT, septal infarct age indeterminate, T-wave inversions.  ____________________________________________  ED COURSE:  Pertinent labs & imaging results that were available during my care of the patient were reviewed by me and considered in my medical decision making (see chart for details). Patient presents to the ER with multiple symptoms and complaints. We will assess with labs and imaging.   Procedures ____________________________________________   LABS (pertinent positives/negatives)  Labs Reviewed  BASIC METABOLIC PANEL - Abnormal; Notable for the following:       Result Value   Potassium 3.4 (*)    Glucose, Bld 139 (*)    BUN 24 (*)    Creatinine, Ser 1.20 (*)    GFR calc non Af Amer 51 (*)    GFR calc Af Amer 59 (*)    All other components within normal limits  CBC - Abnormal; Notable for the following:    Hemoglobin 10.5 (*)    HCT 33.7 (*)    MCHC 31.3 (*)    RDW 20.6 (*)    All other components within normal limits  TROPONIN I - Abnormal; Notable for the following:    Troponin I 0.06 (*)    All other components within normal limits  HEPATIC FUNCTION PANEL - Abnormal; Notable for the following:    Total Protein 8.5 (*)    Total Bilirubin 4.3 (*)    Bilirubin, Direct 1.2 (*)    Indirect Bilirubin 3.1 (*)    All other components within normal limits  URINALYSIS, ROUTINE W REFLEX MICROSCOPIC - Abnormal; Notable for the following:    Color, Urine YELLOW (*)    APPearance CLEAR (*)    Protein, ur 30 (*)    Squamous Epithelial / LPF 0-5 (*)    All other components within normal limits  BRAIN NATRIURETIC PEPTIDE - Abnormal; Notable for the following:    B Natriuretic Peptide >4,500.0 (*)    All other components within normal limits  PROTIME-INR - Abnormal; Notable for the following:     Prothrombin Time 16.0 (*)    All other components within normal limits  LIPASE, BLOOD    RADIOLOGY Images were viewed by me  Chest x-ray IMPRESSION: MK slight interval worsening of CHF with mild pulmonary interstitial edema. No alveolar edema or pneumonia. ____________________________________________  FINAL ASSESSMENT AND PLAN  CHF, hypertensive urgency, medication noncompliance  Plan: Patient with labs and imaging as dictated above. Patient presents to the ER volume overloaded due to medication noncompliance. We did give her 80 mg of IV Lasix and restarted the medication she was supposed to have had yesterday and today. She would best be benefited by observation, diuresis and restarting her typical medication regimen. I will discuss with the hospitalist for observation.   Emily Filbert, MD   Note: This  note was generated in part or whole with voice recognition software. Voice recognition is usually quite accurate but there are transcription errors that can and very often do occur. I apologize for any typographical errors that were not detected and corrected.     Emily Filbert, MD 09/17/16 [phone]

## 2016-09-17 NOTE — ED Triage Notes (Signed)
Pt arrived to ED via [facility] with c/o chest pain that has radiated from the right side to the left side. Pt also has a distended abdomen which was drained last week. Per EMS, pt has BP 174/95 and CBG 174. Pt is alert and oriented with NAD at this time.

## 2016-09-17 NOTE — ED Notes (Signed)
IV attempted x2 by this RN and x1 by April RN.

## 2016-09-17 NOTE — H&P (Addendum)
[facility] - [place] at [facility]   PATIENT NAME: Bonnie Ochoa    MR#:  [id number]  DATE OF BIRTH:  1965/10/02  DATE OF ADMISSION:  09/17/2016  PRIMARY CARE PHYSICIAN: No PCP Per Patient   REQUESTING/REFERRING PHYSICIAN: Dr. Mayford Knife  CHIEF COMPLAINT:   Increasing shortness of breath HISTORY OF PRESENT ILLNESS:   Bonnie Ochoa  is a 51 y.o. female with a known history of Chronic systolic congestive heart failure, pulmonary hypertension, reported not each, to make cardiomyopathy status post ICD implantation in January 2016, hypertensive heart disease, prior history of heavy alcohol abuse, type 2 diabetes, bipolar disorder, comes to the emergency room with increasing abdominal pain, shortness of breath . Patient was admitted January 29 discharge January 30 after she was discharged with acute on chronic CHF systolic. Apparently seems patient is not taking her medications the way she is supposed to where she is not able to answer questions about her Lasix when she last took her and how many pills. Left the pill bottle. She has still not establish with primary care. Not sure if she is compliant with her daughter as well. Comes in today with increasing shortness of breath was found to have mild interstitial pulmonary edema. She received some Lasix. She is hemodynamically stable. She is being admitted for acute on chronic congestive heart failure systolic.  PAST MEDICAL HISTORY:   Past Medical History:  Diagnosis Date  . Asthma   . Bipolar 1 disorder (HCC)   . CHF (congestive heart failure) (HCC)   . Cirrhosis of liver (HCC)   . Coronary artery disease   . Depression   . Diabetes mellitus without complication (HCC)   . Hypertension   . Sleep apnea     PAST SURGICAL HISTOIRY:   Past Surgical History:  Procedure Laterality Date  . ABDOMINAL HYSTERECTOMY    . debribalator  2016  . hysterectmy  2011  . INSERT / REPLACE / REMOVE PACEMAKER    . INSERTION OF  ICD    . TONSILECTOMY, ADENOIDECTOMY, BILATERAL MYRINGOTOMY AND TUBES    . TUBAL LIGATION  1980    SOCIAL HISTORY:   Social History  Substance Use Topics  . Smoking status: Former Games developer  . Smokeless tobacco: Never Used  . Alcohol use No    FAMILY HISTORY:   Family History  Problem Relation Age of Onset  . Hypertension Mother   . Hyperlipidemia Mother     DRUG ALLERGIES:  No Known Allergies  REVIEW OF SYSTEMS:  Review of Systems  Constitutional: Negative for chills, fever and weight loss.  HENT: Negative for ear discharge, ear pain and nosebleeds.   Eyes: Negative for blurred vision, pain and discharge.  Respiratory: Positive for shortness of breath. Negative for sputum production, wheezing and stridor.   Cardiovascular: Negative for chest pain, palpitations, orthopnea and PND.  Gastrointestinal: Negative for abdominal pain, diarrhea, nausea and vomiting.  Genitourinary: Negative for frequency and urgency.  Musculoskeletal: Negative for back pain and joint pain.  Neurological: Negative for sensory change, speech change, focal weakness and weakness.  Psychiatric/Behavioral: Negative for depression and hallucinations. The patient is not nervous/anxious.      MEDICATIONS AT HOME:   Prior to Admission medications   Medication Sig Start Date End Date Taking? Authorizing Provider  aspirin EC 81 MG tablet Take 1 tablet (81 mg total) by mouth daily. 06/04/16  Yes Enid Baas, MD  atorvastatin (LIPITOR) 40 MG tablet Take 1 tablet (40 mg total) by mouth daily.  08/21/16  Yes Antonieta Iba, MD  carvedilol (COREG) 6.25 MG tablet Take 1 tablet (6.25 mg total) by mouth 2 (two) times daily with a meal. 08/21/16  Yes Antonieta Iba, MD  furosemide (LASIX) 40 MG tablet Take 1.5 tablets (60 mg total) by mouth 2 (two) times daily. 08/21/16  Yes Antonieta Iba, MD  lisinopril (PRINIVIL,ZESTRIL) 20 MG tablet Take 0.5 tablets (10 mg total) by mouth daily. 09/11/16  Yes Auburn Bilberry,  MD  nitroGLYCERIN (NITROSTAT) 0.4 MG SL tablet Place 1 tablet (0.4 mg total) under the tongue every 5 (five) minutes as needed for chest pain. 06/04/16  Yes Enid Baas, MD  omega-3 acid ethyl esters (LOVAZA) 1 g capsule Take 1 capsule by mouth 2 (two) times daily.   Yes Historical Provider, MD  PARoxetine (PAXIL) 30 MG tablet Take 1 tablet (30 mg total) by mouth daily. 06/04/16  Yes Enid Baas, MD  potassium chloride (K-DUR) 10 MEQ tablet Take 1 tablet (10 mEq total) by mouth 2 (two) times daily. 08/21/16 08/21/17 Yes Antonieta Iba, MD  prazosin (MINIPRESS) 1 MG capsule Take 1 mg by mouth at bedtime.   Yes Historical Provider, MD  QUEtiapine (SEROQUEL) 300 MG tablet Take 1 tablet (300 mg total) by mouth at bedtime. 06/04/16  Yes Enid Baas, MD  spironolactone (ALDACTONE) 25 MG tablet Take 1 tablet (25 mg total) by mouth daily. 08/21/16  Yes Antonieta Iba, MD  traMADol (ULTRAM) 50 MG tablet Take 1 tablet (50 mg total) by mouth every 8 (eight) hours as needed for moderate pain or severe pain. 06/04/16  Yes Enid Baas, MD  pantoprazole (PROTONIX) 40 MG tablet Take 1 tablet (40 mg total) by mouth daily. Patient not taking: Reported on 09/17/2016 05/24/16 05/24/17  Midge Minium, MD      VITAL SIGNS:  Blood pressure (!) 116/93, pulse 86, temperature 97.7 F (36.5 C), resp. rate 10, height 5\' 5"  (1.651 m), SpO2 100 %.  PHYSICAL EXAMINATION:  GENERAL:  51 y.o.-year-old patient lying in the bed with no acute distress.  EYES: Pupils equal, round, reactive to light and accommodation. No scleral icterus. Extraocular muscles intact.  HEENT: Head atraumatic, normocephalic. Oropharynx and nasopharynx clear.  NECK:  Supple, no jugular venous distention. No thyroid enlargement, no tenderness.  LUNGS: Normal breath sounds bilaterally, no wheezing, rales,rhonchi or crepitation. No use of accessory muscles of respiration.  CARDIOVASCULAR: S1, S2 normal. No murmurs, rubs, or gallops.   ABDOMEN: Soft, nontender, nondistended. Bowel sounds present. No organomegaly or mass.  EXTREMITIES: No pedal edema, cyanosis, or clubbing.  NEUROLOGIC: Cranial nerves II through XII are intact. Muscle strength 5/5 in all extremities. Sensation intact. Gait not checked.  PSYCHIATRIC: The patient is alert and oriented x 3.  SKIN: No obvious rash, lesion, or ulcer.   LABORATORY PANEL:   CBC  Recent Labs Lab 09/17/16 0718  WBC 7.8  HGB 10.5*  HCT 33.7*  PLT 277   ------------------------------------------------------------------------------------------------------------------  Chemistries   Recent Labs Lab 09/17/16 0718  NA 138  K 3.4*  CL 103  CO2 23  GLUCOSE 139*  BUN 24*  CREATININE 1.20*  CALCIUM 9.3  AST 25  ALT 18  ALKPHOS 85  BILITOT 4.3*   ------------------------------------------------------------------------------------------------------------------  Cardiac Enzymes  Recent Labs Lab 09/17/16 0718  TROPONINI 0.06*   ------------------------------------------------------------------------------------------------------------------  RADIOLOGY:  Dg Chest 2 View  Result Date: 09/17/2016 CLINICAL DATA:  Chest pain with nausea. Known ascites with paracentesis last week. History of cirrhosis, asthma, CHF, coronary artery  disease. EXAM: CHEST  2 VIEW COMPARISON:  PA and lateral chest x-ray of September 10, 2016 FINDINGS: The lungs are well-expanded. The cardiac silhouette remains enlarged. The central pulmonary vascularity is slightly more engorged today. There is stable linear density lateral to the left heart border which likely reflects atelectasis or scarring. The ICD is in stable position. The mediastinum is normal in width. There is calcification in the wall of the aortic arch. IMPRESSION: MK slight interval worsening of CHF with mild pulmonary interstitial edema. No alveolar edema or pneumonia. Electronically Signed   By: David  [place] M.D.   On: 09/17/2016  07:12    EKG:    IMPRESSION AND PLAN:   51 year old female with past medical history significant for chronic systolic CHF, nonischemic cardiomyopathy, pulmonary hypertension who has been noncompliant with her medication presents to the hospital secondary to worsening shortness of breath.  #1 acute on chronic systolic CHF exacerbation-noncompliant with her medications. - Started on IV Lasix in the hospital. -We'll do cardiology consultation. -Discussed about low sodium diet, fluid restriction and being compliant with her medications. -Continue on oral  Aldactone, Coreg, lisinopril -Outpatient follow-up recommended. -ECHO: With EF less than 20%  #2 hypertension-continue lisinopril, Lasix, Aldactone and Coreg  #3 liver cirrhosis-generalized abdominal pain. -Patient had paracentesis done during her recent admission. About 120 cc of fluid was removed.  #4 depression-on Seroquel  It seems there has been issues with medication noncompliance, follow-up appointment with primary care physician despite patient and family given list of physicians to follow up with it has not been made, arrangements have been also made for patient to take her CPAP machine to a medical store to repair and has not been done. Care management had made patient and family aware off all of both with previous discharge.   All the records are reviewed and case discussed with ED provider. Management plans discussed with the patient, family and they are in agreement.  CODE STATUS: Full  TOTAL TIME TAKING CARE OF THIS PATIENT: 50 minutes.    Geralene Afshar M.D on 09/17/2016 at 3:22 PM  Between 7am to 6pm - Pager - [phone]  After 6pm go to [email or web address] - password [email or web address] [facility]  [facility]  Office  [phone]  CC: Primary care physician; No PCP Per Patient

## 2016-09-17 NOTE — Progress Notes (Signed)
51 yo bf admitted to room 259 from ED with CHF exacerbation.  A&O x3, gait steady.  No distress on ra.  Cardiac monitor placed on pt and verified with Asher Muir, CNA.  Pt  Denies chest pain at this time.  Lungs diminished lower lobes bil.  Abdomen distended but soft with active bowel sounds.  SL lt ac flushes well.  Skin intact and check with Tammy, RN.  Oriented to room and surroundings, POC reviewed with pt.  SO at bedside.  Denies need at this time.  CB in reach, SR up x 2.

## 2016-09-17 NOTE — ED Notes (Signed)
Pt sating in 80s, placed on 2Ls . MD made aware.

## 2016-09-18 DIAGNOSIS — I5043 Acute on chronic combined systolic (congestive) and diastolic (congestive) heart failure: Secondary | ICD-10-CM

## 2016-09-18 DIAGNOSIS — Z91148 Patient's other noncompliance with medication regimen for other reason: Secondary | ICD-10-CM

## 2016-09-18 DIAGNOSIS — I119 Hypertensive heart disease without heart failure: Secondary | ICD-10-CM

## 2016-09-18 DIAGNOSIS — I428 Other cardiomyopathies: Secondary | ICD-10-CM

## 2016-09-18 DIAGNOSIS — Z9119 Patient's noncompliance with other medical treatment and regimen: Secondary | ICD-10-CM

## 2016-09-18 DIAGNOSIS — I272 Pulmonary hypertension, unspecified: Secondary | ICD-10-CM

## 2016-09-18 DIAGNOSIS — Z91199 Patient's noncompliance with other medical treatment and regimen due to unspecified reason: Secondary | ICD-10-CM

## 2016-09-18 DIAGNOSIS — K7460 Unspecified cirrhosis of liver: Secondary | ICD-10-CM

## 2016-09-18 LAB — BASIC METABOLIC PANEL
ANION GAP: 10 (ref 5–15)
BUN: 28 mg/dL — ABNORMAL HIGH (ref 6–20)
CALCIUM: 8.9 mg/dL (ref 8.9–10.3)
CO2: 25 mmol/L (ref 22–32)
Chloride: 103 mmol/L (ref 101–111)
Creatinine, Ser: 1.34 mg/dL — ABNORMAL HIGH (ref 0.44–1.00)
GFR calc Af Amer: 52 mL/min — ABNORMAL LOW (ref >60)
GFR calc non Af Amer: 45 mL/min — ABNORMAL LOW (ref >60)
GLUCOSE: 122 mg/dL — AB (ref 65–99)
Potassium: 3.3 mmol/L — ABNORMAL LOW (ref 3.5–5.1)
Sodium: 138 mmol/L (ref 135–145)

## 2016-09-18 LAB — MAGNESIUM: MAGNESIUM: 2 mg/dL (ref 1.7–2.4)

## 2016-09-18 LAB — CBC
HCT: 32.1 % — ABNORMAL LOW (ref 35.0–47.0)
HEMOGLOBIN: 10.2 g/dL — AB (ref 12.0–16.0)
MCH: 26.7 pg (ref 26.0–34.0)
MCHC: 31.7 g/dL — AB (ref 32.0–36.0)
MCV: 84.4 fL (ref 80.0–100.0)
Platelets: 232 10*3/uL (ref 150–440)
RBC: 3.81 MIL/uL (ref 3.80–5.20)
RDW: 21.5 % — ABNORMAL HIGH (ref 11.5–14.5)
WBC: 7.1 10*3/uL (ref 3.6–11.0)

## 2016-09-18 LAB — GLUCOSE, CAPILLARY
GLUCOSE-CAPILLARY: 138 mg/dL — AB (ref 65–99)
Glucose-Capillary: 125 mg/dL — ABNORMAL HIGH (ref 65–99)
Glucose-Capillary: 154 mg/dL — ABNORMAL HIGH (ref 65–99)

## 2016-09-18 MED ORDER — POTASSIUM CHLORIDE CRYS ER 20 MEQ PO TBCR
40.0000 meq | EXTENDED_RELEASE_TABLET | Freq: Once | ORAL | Status: AC
  Administered 2016-09-18: 40 meq via ORAL
  Filled 2016-09-18: qty 2

## 2016-09-18 MED ORDER — POTASSIUM CHLORIDE CRYS ER 20 MEQ PO TBCR
40.0000 meq | EXTENDED_RELEASE_TABLET | Freq: Three times a day (TID) | ORAL | Status: DC
  Administered 2016-09-18 (×2): 40 meq via ORAL
  Filled 2016-09-18 (×2): qty 2

## 2016-09-18 MED ORDER — FUROSEMIDE 40 MG PO TABS
60.0000 mg | ORAL_TABLET | Freq: Two times a day (BID) | ORAL | Status: DC
  Administered 2016-09-18: 17:00:00 60 mg via ORAL
  Filled 2016-09-18: qty 1

## 2016-09-18 MED ORDER — POTASSIUM CHLORIDE CRYS ER 10 MEQ PO TBCR: 10.0000 meq | EXTENDED_RELEASE_TABLET | Freq: Three times a day (TID) | ORAL | Status: DC

## 2016-09-18 NOTE — Progress Notes (Signed)
Remote ICD transmission.   

## 2016-09-18 NOTE — Discharge Instructions (Signed)
Heart Failure Clinic appointment on September 25, 2016 at 9:00am with Clarisa Kindred, FNP. Please call [phone] to reschedule.

## 2016-09-18 NOTE — Discharge Summary (Signed)
[facility] - Bonnie Ochoa at [facility]   PATIENT NAME: Bonnie Ochoa    MR#:  [id number]  DATE OF BIRTH:  [birth year]  DATE OF ADMISSION:  09/17/2016 ADMITTING PHYSICIAN: Enedina Finner, MD  DATE OF DISCHARGE:   PRIMARY CARE PHYSICIAN: No PCP Per Patient    ADMISSION DIAGNOSIS:  Acute on chronic systolic congestive heart failure (HCC) [I50.23] Hypertensive urgency [I16.0]  DISCHARGE DIAGNOSIS:  Acute on Chronic mild systolic chf Substance abuse-chronic /marijuana  SECONDARY DIAGNOSIS:   Past Medical History:  Diagnosis Date  . Asthma   . Bipolar 1 disorder (HCC)   . CHF (congestive heart failure) (HCC)   . Cirrhosis of liver (HCC)   . Coronary artery disease   . Depression   . Diabetes mellitus without complication (HCC)   . Hypertension   . Sleep apnea     HOSPITAL COURSE:  51 year old female with past medical history significant for chronic systolic CHF, nonischemic cardiomyopathy, pulmonary hypertension who has been noncompliant with her medication presents to the hospital secondary to worsening shortness of breath.  #1 acute on chronic systolic CHF exacerbation-noncompliant with her medications. - Started on IV Lasix in the hospital.---changed to po home dose lasix -appreciate cardiology consultation. -Discussed about low sodium diet, fluid restriction and being compliant with her medications. -Continue on oral  Aldactone, Coreg, lisinopril -Outpatient follow-up recommended.Patient has follow-up appointment the heart failure clinic. She also has a follow-up appointment with internal medicine. Patient is advised to be compliant with her appointments. -ECHO: With EF less than 20%  #2 hypertension-continue lisinopril, Lasix, Aldactone and Coreg  #3 liver cirrhosis-generalized abdominal pain. -Patient had paracentesis done during her recent admission. About 120 cc of fluid was removed.  #4 depression-on Seroquel  Patient hematocrit is stable.  Sats 100% on room air. We'll discharge her to home with outpatient follow-up with heart failure clinic, Dr. Mariah Milling, and internal medicine.  I will plan was discussed with cardiology PA,Ryan  CONSULTS OBTAINED:  Treatment Team:  Iran Ouch, MD Antonieta Iba, MD  DRUG ALLERGIES:  No Known Allergies  DISCHARGE MEDICATIONS:   Current Discharge Medication List    CONTINUE these medications which have NOT CHANGED   Details  aspirin EC 81 MG tablet Take 1 tablet (81 mg total) by mouth daily. Qty: 30 tablet, Refills: 2    atorvastatin (LIPITOR) 40 MG tablet Take 1 tablet (40 mg total) by mouth daily. Qty: 90 tablet, Refills: 3    carvedilol (COREG) 6.25 MG tablet Take 1 tablet (6.25 mg total) by mouth 2 (two) times daily with a meal. Qty: 180 tablet, Refills: 3    furosemide (LASIX) 40 MG tablet Take 1.5 tablets (60 mg total) by mouth 2 (two) times daily. Qty: 270 tablet, Refills: 3    lisinopril (PRINIVIL,ZESTRIL) 20 MG tablet Take 0.5 tablets (10 mg total) by mouth daily.    nitroGLYCERIN (NITROSTAT) 0.4 MG SL tablet Place 1 tablet (0.4 mg total) under the tongue every 5 (five) minutes as needed for chest pain. Qty: 30 tablet, Refills: 12    omega-3 acid ethyl esters (LOVAZA) 1 g capsule Take 1 capsule by mouth 2 (two) times daily.    PARoxetine (PAXIL) 30 MG tablet Take 1 tablet (30 mg total) by mouth daily. Qty: 30 tablet, Refills: 2    potassium chloride (K-DUR) 10 MEQ tablet Take 1 tablet (10 mEq total) by mouth 2 (two) times daily. Qty: 180 tablet, Refills: 3    prazosin (MINIPRESS) 1 MG capsule Take  1 mg by mouth at bedtime.    QUEtiapine (SEROQUEL) 300 MG tablet Take 1 tablet (300 mg total) by mouth at bedtime. Qty: 30 tablet, Refills: 2    spironolactone (ALDACTONE) 25 MG tablet Take 1 tablet (25 mg total) by mouth daily. Qty: 90 tablet, Refills: 3    traMADol (ULTRAM) 50 MG tablet Take 1 tablet (50 mg total) by mouth every 8 (eight) hours as needed for  moderate pain or severe pain. Qty: 20 tablet, Refills: 0    pantoprazole (PROTONIX) 40 MG tablet Take 1 tablet (40 mg total) by mouth daily. Qty: 30 tablet, Refills: 5        If you experience worsening of your admission symptoms, develop shortness of breath, life threatening emergency, suicidal or homicidal thoughts you must seek medical attention immediately by calling 911 or calling your MD immediately  if symptoms less severe.  You Must read complete instructions/literature along with all the possible adverse reactions/side effects for all the Medicines you take and that have been prescribed to you. Take any new Medicines after you have completely understood and accept all the possible adverse reactions/side effects.   Please note  You were cared for by a hospitalist during your hospital stay. If you have any questions about your discharge medications or the care you received while you were in the hospital after you are discharged, you can call the unit and asked to speak with the hospitalist on call if the hospitalist that took care of you is not available. Once you are discharged, your primary care physician will handle any further medical issues. Please note that NO REFILLS for any discharge medications will be authorized once you are discharged, as it is imperative that you return to your primary care physician (or establish a relationship with a primary care physician if you do not have one) for your aftercare needs so that they can reassess your need for medications and monitor your lab values. Today   SUBJECTIVE   Patient appeared as though sleepy secondary to her Seroquel which is given in the morning Urinary while. No shortness of breath. Slept well through the night.  VITAL SIGNS:  Blood pressure 114/81, pulse 82, temperature 97.6 F (36.4 C), temperature source Oral, resp. rate 17, height 5\' 5"  (1.651 m), weight 71.2 kg (156 lb 14.4 oz), SpO2 97 %.  I/O:   Intake/Output  Summary (Last 24 hours) at 09/18/16 1354 Last data filed at 09/18/16 1018  Gross per 24 hour  Intake              480 ml  Output              850 ml  Net             -370 ml    PHYSICAL EXAMINATION:  GENERAL:  51 y.o.-year-old patient lying in the bed with no acute distress.  EYES: Pupils equal, round, reactive to light and accommodation. No scleral icterus. Extraocular muscles intact.  HEENT: Head atraumatic, normocephalic. Oropharynx and nasopharynx clear.  NECK:  Supple, no jugular venous distention. No thyroid enlargement, no tenderness.  LUNGS: Normal breath sounds bilaterally, no wheezing, rales,rhonchi or crepitation. No use of accessory muscles of respiration.  CARDIOVASCULAR: S1, S2 normal. No murmurs, rubs, or gallops.  ABDOMEN: Soft, non-tender, non-distended. Bowel sounds present. No organomegaly or mass.  EXTREMITIES: No pedal edema, cyanosis, or clubbing.  NEUROLOGIC: Cranial nerves II through XII are intact. Muscle strength 5/5 in all extremities. Sensation intact. Gait  not checked.  PSYCHIATRIC: The patient is alert and oriented x 3.  SKIN: No obvious rash, lesion, or ulcer.   DATA REVIEW:   CBC   Recent Labs Lab 09/18/16 0819  WBC 7.1  HGB 10.2*  HCT 32.1*  PLT 232    Chemistries   Recent Labs Lab 09/17/16 0718 09/18/16 0819  NA 138 138  K 3.4* 3.3*  CL 103 103  CO2 23 25  GLUCOSE 139* 122*  BUN 24* 28*  CREATININE 1.20* 1.34*  CALCIUM 9.3 8.9  MG  --  2.0  AST 25  --   ALT 18  --   ALKPHOS 85  --   BILITOT 4.3*  --     Microbiology Results   Recent Results (from the past 240 hour(s))  Body fluid culture     Status: None   Collection Time: 09/10/16 10:00 AM  Result Value Ref Range Status   Specimen Description PERITONEAL  Final   Special Requests NONE  Final   Gram Stain   Final    RARE WBC PRESENT, PREDOMINANTLY MONONUCLEAR RARE WBC PRESENT, PREDOMINANTLY PMN NO ORGANISMS SEEN    Culture   Final    NO GROWTH 3 DAYS Performed at  [facility], [address] [place], [place] [address]    Report Status 09/13/2016 FINAL  Final    RADIOLOGY:  Dg Chest 2 View  Result Date: 09/17/2016 CLINICAL DATA:  Chest pain with nausea. Known ascites with paracentesis last week. History of cirrhosis, asthma, CHF, coronary artery disease. EXAM: CHEST  2 VIEW COMPARISON:  PA and lateral chest x-ray of September 10, 2016 FINDINGS: The lungs are well-expanded. The cardiac silhouette remains enlarged. The central pulmonary vascularity is slightly more engorged today. There is stable linear density lateral to the left heart border which likely reflects atelectasis or scarring. The ICD is in stable position. The mediastinum is normal in width. There is calcification in the wall of the aortic arch. IMPRESSION: MK slight interval worsening of CHF with mild pulmonary interstitial edema. No alveolar edema or pneumonia. Electronically Signed   By: David  [place] M.D.   On: 09/17/2016 07:12     Management plans discussed with the patient, family and they are in agreement.  CODE STATUS:     Code Status Orders        Start     Ordered   09/17/16 1713  Full code  Continuous     09/17/16 1712    Code Status History    Date Active Date Inactive Code Status Order ID Comments User Context   09/10/2016 11:05 AM 09/11/2016  8:45 PM Full Code [id number]  Auburn Bilberry, MD Inpatient   05/30/2016 11:29 AM 06/04/2016 10:26 PM Full Code [id number]  Enedina Finner, MD Inpatient      TOTAL TIME TAKING CARE OF THIS PATIENT: 40 minutes.    Darey Hershberger M.D on 09/18/2016 at 1:54 PM  Between 7am to 6pm - Pager - [phone] After 6pm go to [email or web address] - Social research officer, government  [facility]  Office  [phone]  CC: Primary care physician; No PCP Per Patient

## 2016-09-18 NOTE — Progress Notes (Signed)
Due to patient's current admission, have rescheduled her initial appointment at the HF Clinic to September 25, 2016 at 9:00am. Thank you.

## 2016-09-18 NOTE — Care Management Note (Signed)
Case Management Note  Patient Details  Name: Bonnie Ochoa MRN: [id number] Date of Birth: 01/20/1966          Referral to [facility] for SN.   Disussed that appointment with Dr Sheralyn Boatman was rescheduled.  Discussed the importance of keeping all scheduled appointments and medication compliance.  Verbalized understanding.  Patient confirmed all her contact information.  heart Failure Clinic has been rescheduled  Expected Discharge Date:  09/18/16               Expected Discharge Plan:  Home w Home Health Services  In-House Referral:     Discharge planning Services  CM Consult  Post Acute Care Choice:  Home Health Choice offered to:  Patient  DME Arranged:  N/A DME Agency:  NA  HH Arranged:  RN HH Agency:  [facility]  Status of Service:  Completed, signed off  If discussed at Long Length of Stay Meetings, dates discussed:    Additional Comments:  Eber Hong, RN 09/18/2016, 2:51 PM

## 2016-09-18 NOTE — Progress Notes (Signed)
Pt discharged to home via wc.  Instructions  given to pt.  Questions answered.  No distress.  

## 2016-09-18 NOTE — Consult Note (Signed)
Cardiology Consultation Note  Patient ID: Bonnie Ochoa, Bonnie Ochoa: [id number], DOB/AGE: 1966-07-26 51 y.o. Admit date: 09/17/2016   Date of Consult: 09/18/2016 Primary Physician: No PCP Per Patient Primary Cardiologist: Dr. Mariah Milling, MD Requesting Physician: Dr. Allena Katz, MD  Chief Complaint: SOB x 1 day Reason for Consult: Acute on chronic combined CHF  HPI: 51 y.o. female with h/o chronic combined systolic and diastolic CHF, pulmonary hypertension, reported NICM s/p SJM ICD implanted in 08/2014 without any prior discharges, hypertensive heart disease, prior heavy alcohol abuse, DM2, Bipolar disorder, medication and dietary noncompliance and asthma who recently moved to [facility] from [place] in 2017 presented to [facility] on 09/17/16 with worsening SOB x 1 day.   She recently established with Dr. Mariah Milling, MD on 05/28/16 after moving to [facility] from [facility] in June. She had not seen a cardiologist since moving until then. She reports initially being diagnosed with CHF in 2008. She most recently reportedly underwent LHC in 08/2014 that she reports showed no obstructive CAD. She underwent SJM ICD implantation in 08/2014 as well. She has been managed on Lasix, spironolactone, and metoprolol as an outpatient, though apparently ran out of her medications after moving to [place]. These were recently restarted by Dr. Mariah Milling on 05/28/16. Most recent echo from 05/2016 showed an EF of 20-25%, unable to excluded RWMA, moderate MR, left atrium moderately dilated, RA mildly dilated, PASP 64 mmHg. She reports having to previously undergo a paracentesis in [place] in 2016. She has seen GI regarding her abdominal pain as well as ascites seen on prior CT in early October. She was admitted to [facility] in October 2017 for volume overload. Diuresed 20 L at that time. Most recently she was admitted to [facility] in late January from 1/29-1/30 for reported volume overload. She was diuresed and underwent paracentesis with 120 mL being aspirated. Her discharge weight on 1/30 was 178  pounds.    Patient's history is somewhat limited this morning as she states she wants to sleep. She will answer some questions. She notes increase in SOB on 2/5 without abdominal distension or LE edema. She is uncertain what medications she is taking at home. She does not weigh herself at home. She does not follow a heart healthy diet. She has not noted any chest pain, early satiety, orthopnea, or PND.  Upon the patient's arrival to [facility] they were found to have a weight of 156 pounds (22 pounds down from her most recent discharge on 1/30). BNP > 4500, troponin mildly elevated at 0.06 and not trended upon admission. SCr 1.20 (baseline approximately 0.9 to 1.1). BUN increased at 24. Potassium low at 3.4 given KCl 10 meq upon admission. HGB 10.5 (approximately at baseline). WBC 7.8. PLT 277. T bili 4.3. ECG non-acute as below, CXR showed slight worsening of CHF with mild interstitial edema. She was started on IV Lasix 40 mg tid upon admission with no documented UOP. She is currently resting comfortably in her bed on room air without complaints.   Past Medical History:  Diagnosis Date  . Asthma   . Bipolar 1 disorder (HCC)   . CHF (congestive heart failure) (HCC)   . Cirrhosis of liver (HCC)   . Coronary artery disease   . Depression   . Diabetes mellitus without complication (HCC)   . Hypertension   . Sleep apnea       Most Recent Cardiac Studies: Echo 05/2016: Study Conclusions  - Left ventricle: The cavity size was normal. Systolic function was   severely reduced. The  estimated ejection fraction was less than   25%. Diffuse hypokinesis. Regional wall motion abnormalities   cannot be excluded. Doppler parameters are consistent with   abnormal left ventricular relaxation. - Mitral valve: There was moderate regurgitation. - Left atrium: The atrium was moderately dilated. - Right ventricle: Pacer wire or catheter noted in right ventricle. - Right atrium: The atrium was mildly dilated. -  Tricuspid valve: There was moderate regurgitation. - Pulmonary arteries: Systolic pressure was severely elevated. PA   peak pressure: 64 mm Hg (S). - Pericardium, extracardiac: A trivial pericardial effusion was   identified.   Surgical History:  Past Surgical History:  Procedure Laterality Date  . ABDOMINAL HYSTERECTOMY    . debribalator  2016  . hysterectmy  2011  . INSERT / REPLACE / REMOVE PACEMAKER    . INSERTION OF ICD    . TONSILECTOMY, ADENOIDECTOMY, BILATERAL MYRINGOTOMY AND TUBES    . TUBAL LIGATION  1980     Home Meds: Prior to Admission medications   Medication Sig Start Date End Date Taking? Authorizing Provider  aspirin EC 81 MG tablet Take 1 tablet (81 mg total) by mouth daily. 06/04/16  Yes Enid Baas, MD  atorvastatin (LIPITOR) 40 MG tablet Take 1 tablet (40 mg total) by mouth daily. 08/21/16  Yes Antonieta Iba, MD  carvedilol (COREG) 6.25 MG tablet Take 1 tablet (6.25 mg total) by mouth 2 (two) times daily with a meal. 08/21/16  Yes Antonieta Iba, MD  furosemide (LASIX) 40 MG tablet Take 1.5 tablets (60 mg total) by mouth 2 (two) times daily. 08/21/16  Yes Antonieta Iba, MD  lisinopril (PRINIVIL,ZESTRIL) 20 MG tablet Take 0.5 tablets (10 mg total) by mouth daily. 09/11/16  Yes Auburn Bilberry, MD  nitroGLYCERIN (NITROSTAT) 0.4 MG SL tablet Place 1 tablet (0.4 mg total) under the tongue every 5 (five) minutes as needed for chest pain. 06/04/16  Yes Enid Baas, MD  omega-3 acid ethyl esters (LOVAZA) 1 g capsule Take 1 capsule by mouth 2 (two) times daily.   Yes Historical Provider, MD  PARoxetine (PAXIL) 30 MG tablet Take 1 tablet (30 mg total) by mouth daily. 06/04/16  Yes Enid Baas, MD  potassium chloride (K-DUR) 10 MEQ tablet Take 1 tablet (10 mEq total) by mouth 2 (two) times daily. 08/21/16 08/21/17 Yes Antonieta Iba, MD  prazosin (MINIPRESS) 1 MG capsule Take 1 mg by mouth at bedtime.   Yes Historical Provider, MD  QUEtiapine (SEROQUEL) 300  MG tablet Take 1 tablet (300 mg total) by mouth at bedtime. 06/04/16  Yes Enid Baas, MD  spironolactone (ALDACTONE) 25 MG tablet Take 1 tablet (25 mg total) by mouth daily. 08/21/16  Yes Antonieta Iba, MD  traMADol (ULTRAM) 50 MG tablet Take 1 tablet (50 mg total) by mouth every 8 (eight) hours as needed for moderate pain or severe pain. 06/04/16  Yes Enid Baas, MD  pantoprazole (PROTONIX) 40 MG tablet Take 1 tablet (40 mg total) by mouth daily. Patient not taking: Reported on 09/17/2016 05/24/16 05/24/17  Midge Minium, MD    Inpatient Medications:  . aspirin EC  81 mg Oral Daily  . atorvastatin  40 mg Oral Daily  . carvedilol  6.25 mg Oral BID WC  . enoxaparin (LOVENOX) injection  40 mg Subcutaneous Q24H  . furosemide  40 mg Intravenous TID AC  . insulin aspart  0-5 Units Subcutaneous QHS  . insulin aspart  0-9 Units Subcutaneous TID WC  . lisinopril  10 mg  Oral Daily  . omega-3 acid ethyl esters  1 capsule Oral BID  . PARoxetine  30 mg Oral Daily  . potassium chloride  10 mEq Oral BID  . prazosin  1 mg Oral QHS  . QUEtiapine  300 mg Oral QHS  . spironolactone  25 mg Oral Daily     Allergies: No Known Allergies  Social History   Social History  . Marital status: Single    Spouse name: N/A  . Number of children: N/A  . Years of education: N/A   Occupational History  . Not on file.   Social History Main Topics  . Smoking status: Former Games developer  . Smokeless tobacco: Never Used  . Alcohol use No  . Drug use: Yes    Types: Marijuana     Comment: last smoked marijuana earlier today  . Sexual activity: No   Other Topics Concern  . Not on file   Social History Narrative  . No narrative on file     Family History  Problem Relation Age of Onset  . Hypertension Mother   . Hyperlipidemia Mother      Review of Systems: Review of Systems  Constitutional: Positive for malaise/fatigue. Negative for chills, diaphoresis, fever and weight loss.  HENT:  Negative for congestion.   Eyes: Negative for discharge and redness.  Respiratory: Positive for cough and shortness of breath. Negative for hemoptysis, sputum production and wheezing.   Cardiovascular: Negative for chest pain, palpitations, orthopnea, claudication, leg swelling and PND.  Gastrointestinal: Negative for abdominal pain, blood in stool, heartburn, melena, nausea and vomiting.  Genitourinary: Negative for hematuria.  Musculoskeletal: Negative for falls and myalgias.  Skin: Negative for rash.  Neurological: Positive for weakness. Negative for dizziness, tingling, tremors, sensory change, speech change, focal weakness and loss of consciousness.  Endo/Heme/Allergies: Does not bruise/bleed easily.  Psychiatric/Behavioral: Negative for substance abuse. The patient is not nervous/anxious.   All other systems reviewed and are negative.   Labs:  Recent Labs  09/17/16 0718  TROPONINI 0.06*   Lab Results  Component Value Date   WBC 7.8 09/17/2016   HGB 10.5 (L) 09/17/2016   HCT 33.7 (L) 09/17/2016   MCV 85.4 09/17/2016   PLT 277 09/17/2016     Recent Labs Lab 09/17/16 0718  NA 138  K 3.4*  CL 103  CO2 23  BUN 24*  CREATININE 1.20*  CALCIUM 9.3  PROT 8.5*  BILITOT 4.3*  ALKPHOS 85  ALT 18  AST 25  GLUCOSE 139*   No results found for: CHOL, HDL, LDLCALC, TRIG No results found for: DDIMER  Radiology/Studies:  Dg Chest 2 View  Result Date: 09/17/2016 CLINICAL DATA:  Chest pain with nausea. Known ascites with paracentesis last week. History of cirrhosis, asthma, CHF, coronary artery disease. EXAM: CHEST  2 VIEW COMPARISON:  PA and lateral chest x-ray of September 10, 2016 FINDINGS: The lungs are well-expanded. The cardiac silhouette remains enlarged. The central pulmonary vascularity is slightly more engorged today. There is stable linear density lateral to the left heart border which likely reflects atelectasis or scarring. The ICD is in stable position. The  mediastinum is normal in width. There is calcification in the wall of the aortic arch. IMPRESSION: MK slight interval worsening of CHF with mild pulmonary interstitial edema. No alveolar edema or pneumonia. Electronically Signed   By: David  [place] M.D.   On: 09/17/2016 07:12   Dg Chest 2 View  Result Date: 09/10/2016 CLINICAL DATA:  Abdominal pain, onset  yesterday. Fell in the shower. EXAM: CHEST  2 VIEW COMPARISON:  07/31/2016 FINDINGS: Unchanged moderate cardiomegaly. Intact appearances of the transvenous cardiac lead. The lungs are clear except for unchanged curvilinear scarring in the left lung laterally. The pulmonary vasculature is normal. There is no pleural effusion. Hilar and mediastinal contours are unremarkable and unchanged. IMPRESSION: Stable cardiomegaly.  No acute cardiopulmonary findings. Electronically Signed   By: Ellery Plunk M.D.   On: 09/10/2016 00:58   Ct Abdomen Pelvis W Contrast  Result Date: 09/10/2016 CLINICAL DATA:  Initial evaluation for acute abdominal pain. History of cirrhosis, CHF. EXAM: CT ABDOMEN AND PELVIS WITH CONTRAST TECHNIQUE: Multidetector CT imaging of the abdomen and pelvis was performed using the standard protocol following bolus administration of intravenous contrast. CONTRAST:  ISOVUE-300 IOPAMIDOL (ISOVUE-300) INJECTION 61%, 30mL ISOVUE-300 IOPAMIDOL (ISOVUE-300) INJECTION 61% COMPARISON:  Prior CT from 05/30/2016. FINDINGS: Lower chest: Cardiomegaly partially visualized. Reflux of contrast into the IVC and hepatic veins suggest right heart dysfunction. No pleural or pericardial fusion. No evidence for pulmonary edema within the visualized lung bases. Mild left basilar atelectasis/scarring. Hepatobiliary: Heterogeneous enhancement of the liver with nutmeg appearance, compatible with congestive hepatopathy. Mild irregularity of the hepatic contour or suggest underlying cirrhosis. Overall, changes are similar to previous. Pericholecystic free fluid  likely related to overall volume status/ascites. No biliary dilatation. Pancreas: Pancreas within normal limits. Spleen: Spleen within normal limits. Adrenals/Urinary Tract: Adrenal glands unremarkable. Kidneys equal in size with symmetric enhancement. No nephrolithiasis, hydronephrosis, or focal enhancing renal mass. No hydroureter. Bladder partially distended without abnormality. Stomach/Bowel: Small hiatal hernia. Stomach otherwise unremarkable. No evidence for bowel obstruction. No acute inflammatory changes seen about the small bowel. Mild circumferential wall thickening about the distal colon, which may be related to underlying hepatic disease, although acute proctocolitis could also be considered. No other significant inflammatory changes about the bowels. Moderate amount of retained stool within the proximal colon. Appendix is normal. Vascular/Lymphatic: Normal intravascular enhancement seen throughout the abdomen and pelvis. Mildly prominent left inguinal lymph nodes measure up to 13 mm (series 2, image 86), indeterminate. This is increased from prior. No other significant adenopathy identified. Reproductive: Uterus is absent.  Ovaries stable in appearance. Other: No free air.  Moderate volume ascites. Musculoskeletal: Diffuse anasarca. Prominent degenerative spondylolysis about L4-5. No acute osseous abnormality. No worrisome lytic or blastic osseous lesions. IMPRESSION: 1. Cardiomegaly with evidence for right heart dysfunction. Associated congestive hepatopathy with possible mild underlying cirrhosis, similar to previous. 2. Moderate volume ascites with diffuse anasarca. 3. Circumferential wall thickening about the distal colon with question of hazy inflammatory stranding. While this finding may in part be related underlying intrinsic liver disease, possible acute proctocolitis could also be considered. 4. Mildly prominent left inguinal lymph nodes measuring up to 13 mm, indeterminate. No other adenopathy  identified. Electronically Signed   By: Rise Mu M.D.   On: 09/10/2016 04:13   US Paracentesis  Result Date: 09/10/2016 INDICATION: Ascites EXAM: ULTRASOUND GUIDED DIAGNOSTIC PARACENTESIS MEDICATIONS: None. COMPLICATIONS: None immediate. PROCEDURE: Informed written consent was obtained from the patient after a discussion of the risks, benefits and alternatives to treatment. A timeout was performed prior to the initiation of the procedure. Initial ultrasound scanning demonstrates a moderate amount of ascites within the abdomen . The right mid abdomen was prepped and draped in the usual sterile fashion. 1% lidocaine with epinephrine was used for local anesthesia. Following this, a 19 gauge, 7-cm, Yueh catheter was introduced. An ultrasound image was saved for documentation purposes. The  paracentesis was performed. The catheter was removed and a dressing was applied. The patient tolerated the procedure well without immediate post procedural complication. FINDINGS: A total of approximately 120 mL of clear yellow fluid was removed. Samples were sent to the laboratory as requested by the clinical team. IMPRESSION: Successful ultrasound-guided diagnostic paracentesis. Electronically Signed   By: Alcide Clever M.D.   On: 09/10/2016 10:34    EKG: Interpreted by me showed: NSR, 93 bpm, nonspecific inferior st/t changes, lateral TWI (old) Telemetry: Interpreted by me showed: NSR, 80's bpm, 4 beats of NSVT, frequent PVCs  Weights: Filed Weights   09/17/16 1701  Weight: 156 lb 14.4 oz (71.2 kg)     Physical Exam: Blood pressure 113/72, pulse 82, temperature 97.6 F (36.4 C), resp. rate 19, height 5\' 5"  (1.651 m), weight 156 lb 14.4 oz (71.2 kg), SpO2 97 %. Body mass index is 26.11 kg/m. General: Well developed, well nourished, in no acute distress. Head: Normocephalic, atraumatic, sclera non-icteric, no xanthomas, nares are without discharge.  Neck: Negative for carotid bruits. JVD elevated ~ 8  cm. Lungs: Faint crackles along the bilateral bases. Breathing is unlabored. Heart: RRR with S1 S2. No murmurs, rubs, or gallops appreciated. Abdomen: Soft, non-tender, non-distended with normoactive bowel sounds. No hepatomegaly. No rebound/guarding. No obvious abdominal masses. Msk:  Strength and tone appear normal for age. Extremities: No clubbing or cyanosis. No edema. Distal pedal pulses are 2+ and equal bilaterally. Neuro: Alert and oriented X 3. No facial asymmetry. No focal deficit. Moves all extremities spontaneously. Psych:  Responds to questions appropriately with a normal affect.    Assessment and Plan:  Principal Problem:   Acute on chronic combined systolic and diastolic CHF (congestive heart failure) (HCC) Active Problems:   Pulmonary hypertension   Hypokalemia   NICM (nonischemic cardiomyopathy) (HCC)   Hypertensive heart disease   Noncompliance   Cirrhosis (HCC)   Other ascites    1. Acute on chronic combined systolic and diastolic CHF/NICM/pulmonary hypertension: -No further SOB -UOP not documented, though she reports significant diuresis -Her weight is actually down 22 pounds from her discharge weight on 1/30 -Suspect we could probably transition her to PO Lasix later today -No need to repeat echo at this time given recent study in 05/2016 -Medication and dietary noncompliance seems to be an issue for her -Continue Coreg, spironolactone, and lisinopril  -Consider changing to [facility] as an outpatient after washout period if she demonstrates compliance -Status post SJM ICD 08/2014 -Cardiac cath 08/2014 without significant CAD -ASA -Daily weights and strict I&O  2. Ascites/cirrhosis: -Per IM -Abdomen appears stable at this time -Spironolactone  3. Hypertensive heart disease: -Stable -Continue current medications  4. Hypokalemia: -Replete to goal of 4.0 -Check magnesium and replete to a goal of 2.0  5. Noncompliance: -Without compliance she is likely  to have a poor outcome  6. Anemia of chronic disease: -Stable  7. AKI: -Monitor with diuresis -No bmet or cbc this morning   8. Ventricular ectopy: -Appears stable -No indication for amiodarone at this time -S/p SJM ICD   Signed, Carola Frost [facility] Pager: [phone] 09/18/2016, 8:19 AM

## 2016-09-18 NOTE — Care Management (Addendum)
Patient was to have had an appointment with heart failure clinic tomorrow.  Her follow up appointment with cardiology and gi  is not until next week.  She was to have have have had an initial appointment with Sheralyn Boatman today.  Notified office that patient admitted.  CM rescheduled this appointment for 2.14.2018 at 9AM. Patient may benefit from home health nursing follow up and will need pcp.

## 2016-09-19 ENCOUNTER — Ambulatory Visit: Payer: Medicare Other | Admitting: Family

## 2016-09-19 ENCOUNTER — Encounter: Payer: Self-pay | Admitting: Cardiology

## 2016-09-20 LAB — CUP PACEART REMOTE DEVICE CHECK
Battery Voltage: 3.07 V
Brady Statistic RV Percent Paced: 1 %
HighPow Impedance: 39 Ohm
HighPow Impedance: 39 Ohm
Implantable Lead Implant Date: 20160410
Implantable Lead Location: 753860
Implantable Pulse Generator Implant Date: 20160410
Lead Channel Pacing Threshold Amplitude: 1 V
Lead Channel Pacing Threshold Pulse Width: 0.5 ms
Lead Channel Setting Pacing Pulse Width: 0.5 ms
Lead Channel Setting Sensing Sensitivity: 0.5 mV
MDC IDC MSMT BATTERY REMAINING LONGEVITY: 94 mo
MDC IDC MSMT BATTERY REMAINING PERCENTAGE: 85 %
MDC IDC MSMT LEADCHNL RV IMPEDANCE VALUE: 360 Ohm
MDC IDC MSMT LEADCHNL RV SENSING INTR AMPL: 11.8 mV
MDC IDC SESS DTM: 20180131070016
MDC IDC SET LEADCHNL RV PACING AMPLITUDE: 2.5 V
Pulse Gen Serial Number: 7259964

## 2016-09-25 ENCOUNTER — Ambulatory Visit: Payer: Medicare Other | Admitting: Family

## 2016-09-26 ENCOUNTER — Encounter: Payer: Self-pay | Admitting: Gastroenterology

## 2016-09-26 ENCOUNTER — Encounter: Payer: Self-pay | Admitting: Cardiovascular Disease

## 2016-09-26 ENCOUNTER — Ambulatory Visit (INDEPENDENT_AMBULATORY_CARE_PROVIDER_SITE_OTHER): Payer: Medicare Other | Admitting: Gastroenterology

## 2016-09-26 VITALS — BP 126/88 | HR 91 | Ht 65.0 in | Wt 158.0 lb

## 2016-09-26 DIAGNOSIS — R188 Other ascites: Secondary | ICD-10-CM | POA: Diagnosis not present

## 2016-09-26 DIAGNOSIS — K6289 Other specified diseases of anus and rectum: Secondary | ICD-10-CM | POA: Diagnosis not present

## 2016-09-26 NOTE — Progress Notes (Signed)
Gastroenterology Consultation  Referring Provider:    Dr Posey Pronto  Primary Care Physician:  None  Primary Gastroenterologist:  Dr. Jonathon Bellows  Reason for Consultation:     Cirrhosis of liver         HPI:   Bonnie Ochoa is a 51 y.o. y/o female referred for consultation & management  by Dr. Rayne Du PCP Per Patient.  She has a history of non ischemic cardiomyopathy , OSA. EF 25 %  She is here today for a hospital follow up.She was recently discharged on 09/18/16 when admitted with decompensated heart failure. She was noted to have liver cirrhosis. SAAG > 1.1 suggestive ascites from portal hypertension or from cardiac ascites. INR 1.27.BMP >4500 9 days back . CT abdomen showed cirrhosis of liver. Circumferential wall thickening seen of distal colon . She is on aldactone 25 mg a day and lasix 60 mg a day . Last INR 1.27 , Cr 1.34, potassium 3.3 . She is also  On Coreg.    She was last seen by Dr Allen Norris on 05/24/16 and she was advised she needs to establish with a PCP at that time.   On 08/21/16 she weighed 70.5 Kgs   BP 126/88   Pulse 91   Ht _0  (1.651 m)   Wt 158 lb (71.7 kg)   SpO2 99%   BMI 26.29 kg/m    She is here today with her sister, her sister says that she can only walk a few steps before she goes short o fbreath on walking just a few steps, wakes up in the night with shortness of breath and when she lies down. Diet is low salt. She admits she was obese in the past , recalls weighed over 300 lbs. She does recall that in the 1980's and 1990's drank a lot of alcohol for many years and then quit.   Normal bowel movements,has nocticed some blood in her stool at times. At times she has also had nose bleeds. She is due to see a PCP on April 2nd.   Past Medical History:  Diagnosis Date  . Asthma   . Bipolar 1 disorder ([place])   . CHF (congestive heart failure) ([place])   . Cirrhosis of liver ([place])   . Coronary artery disease   . Depression   . Diabetes mellitus without complication ([place])   .  Hypertension   . Sleep apnea     Past Surgical History:  Procedure Laterality Date  . ABDOMINAL HYSTERECTOMY    . debribalator  2016  . hysterectmy  2011  . INSERT / REPLACE / REMOVE PACEMAKER    . INSERTION OF ICD    . TONSILECTOMY, ADENOIDECTOMY, BILATERAL MYRINGOTOMY AND TUBES    . TUBAL LIGATION  1980    Prior to Admission medications   Medication Sig Start Date End Date Taking? Authorizing Provider  aspirin EC 81 MG tablet Take 1 tablet (81 mg total) by mouth daily. 06/04/16  Yes Gladstone Lighter, MD  atorvastatin (LIPITOR) 40 MG tablet Take 1 tablet (40 mg total) by mouth daily. 08/21/16  Yes Minna Merritts, MD  carvedilol (COREG) 6.25 MG tablet Take 1 tablet (6.25 mg total) by mouth 2 (two) times daily with a meal. 08/21/16  Yes Minna Merritts, MD  furosemide (LASIX) 40 MG tablet Take 1.5 tablets (60 mg total) by mouth 2 (two) times daily. 08/21/16  Yes Minna Merritts, MD  lisinopril (PRINIVIL,ZESTRIL) 20 MG tablet Take 0.5 tablets (10 mg total) by  mouth daily. 09/11/16  Yes Dustin Flock, MD  nitroGLYCERIN (NITROSTAT) 0.4 MG SL tablet Place 1 tablet (0.4 mg total) under the tongue every 5 (five) minutes as needed for chest pain. 06/04/16  Yes Gladstone Lighter, MD  omega-3 acid ethyl esters (LOVAZA) 1 g capsule Take 1 capsule by mouth 2 (two) times daily.   Yes Historical Provider, MD  pantoprazole (PROTONIX) 40 MG tablet Take 1 tablet (40 mg total) by mouth daily. 05/24/16 05/24/17 Yes Darren Allen Norris, MD  PARoxetine (PAXIL) 30 MG tablet Take 1 tablet (30 mg total) by mouth daily. 06/04/16  Yes Gladstone Lighter, MD  potassium chloride (K-DUR) 10 MEQ tablet Take 1 tablet (10 mEq total) by mouth 2 (two) times daily. 08/21/16 08/21/17 Yes Minna Merritts, MD  prazosin (MINIPRESS) 1 MG capsule Take 1 mg by mouth at bedtime.   Yes Historical Provider, MD  QUEtiapine (SEROQUEL) 300 MG tablet Take 1 tablet (300 mg total) by mouth at bedtime. 06/04/16  Yes Gladstone Lighter, MD    spironolactone (ALDACTONE) 25 MG tablet Take 1 tablet (25 mg total) by mouth daily. 08/21/16  Yes Minna Merritts, MD    Family History  Problem Relation Age of Onset  . Hypertension Mother   . Hyperlipidemia Mother      Social History  Substance Use Topics  . Smoking status: Former Research scientist (life sciences)  . Smokeless tobacco: Never Used  . Alcohol use No    Allergies as of 09/26/2016  . (No Known Allergies)    Review of Systems:    All systems reviewed and negative except where noted in HPI.   Physical Exam:  BP 126/88   Pulse 91   Ht _0  (1.651 m)   Wt 158 lb (71.7 kg)   SpO2 99%   BMI 26.29 kg/m  No LMP recorded. Patient has had a hysterectomy. Psych:  Alert and cooperative. Normal mood and affect. General:   Alert,  Well-developed, well-nourished, pleasant and cooperative in NAD Head:  Normocephalic and atraumatic.JVD raised .  Eyes:  Sclera clear, no icterus.   Conjunctiva pink. Ears:  Normal auditory acuity. Nose:  No deformity, discharge, or lesions. Mouth:  No deformity or lesions,oropharynx pink & moist. Neck:  Supple; no masses or thyromegaly. Lungs:  Respirations even and unlabored.  Clear throughout to auscultation.   No wheezes, crackles, or rhonchi. No acute distress. Heart:  Regular rate and rhythm; no murmurs, clicks, rubs, or gallops. Abdomen:  Normal bowel sounds.  No bruits.  Soft, non-tender and distended without masses, hepatosplenomegaly or hernias noted. Fluid present ,  No guarding or rebound tenderness.    Msk:  Symmetrical without gross deformities. Good, equal movement & strength bilaterally. Neurologic:  Alert and oriented x3;  grossly normal neurologically. Skin:  Intact without significant lesions or rashes. No jaundice. Lymph Nodes:  No significant cervical adenopathy. Psych:  Alert and cooperative. Normal mood and affect.  Imaging Studies: Dg Chest 2 View  Result Date: 09/17/2016 CLINICAL DATA:  Chest pain with nausea. Known ascites with  paracentesis last week. History of cirrhosis, asthma, CHF, coronary artery disease. EXAM: CHEST  2 VIEW COMPARISON:  PA and lateral chest x-ray of September 10, 2016 FINDINGS: The lungs are well-expanded. The cardiac silhouette remains enlarged. The central pulmonary vascularity is slightly more engorged today. There is stable linear density lateral to the left heart border which likely reflects atelectasis or scarring. The ICD is in stable position. The mediastinum is normal in width. There is calcification in the wall  of the aortic arch. IMPRESSION: [place] slight interval worsening of CHF with mild pulmonary interstitial edema. No alveolar edema or pneumonia. Electronically Signed   By: David  Martinique M.D.   On: 09/17/2016 07:12   Dg Chest 2 View  Result Date: 09/10/2016 CLINICAL DATA:  Abdominal pain, onset yesterday. Fell in the shower. EXAM: CHEST  2 VIEW COMPARISON:  07/31/2016 FINDINGS: Unchanged moderate cardiomegaly. Intact appearances of the transvenous cardiac lead. The lungs are clear except for unchanged curvilinear scarring in the left lung laterally. The pulmonary vasculature is normal. There is no pleural effusion. Hilar and mediastinal contours are unremarkable and unchanged. IMPRESSION: Stable cardiomegaly.  No acute cardiopulmonary findings. Electronically Signed   By: Andreas Newport M.D.   On: 09/10/2016 00:58   Ct Abdomen Pelvis W Contrast  Result Date: 09/10/2016 CLINICAL DATA:  Initial evaluation for acute abdominal pain. History of cirrhosis, CHF. EXAM: CT ABDOMEN AND PELVIS WITH CONTRAST TECHNIQUE: Multidetector CT imaging of the abdomen and pelvis was performed using the standard protocol following bolus administration of intravenous contrast. CONTRAST:  150m ISOVUE-300 IOPAMIDOL (ISOVUE-300) INJECTION 61%, 381mISOVUE-300 IOPAMIDOL (ISOVUE-300) INJECTION 61% COMPARISON:  Prior CT from 05/30/2016. FINDINGS: Lower chest: Cardiomegaly partially visualized. Reflux of contrast into the IVC  and hepatic veins suggest right heart dysfunction. No pleural or pericardial fusion. No evidence for pulmonary edema within the visualized lung bases. Mild left basilar atelectasis/scarring. Hepatobiliary: Heterogeneous enhancement of the liver with nutmeg appearance, compatible with congestive hepatopathy. Mild irregularity of the hepatic contour or suggest underlying cirrhosis. Overall, changes are similar to previous. Pericholecystic free fluid likely related to overall volume status/ascites. No biliary dilatation. Pancreas: Pancreas within normal limits. Spleen: Spleen within normal limits. Adrenals/Urinary Tract: Adrenal glands unremarkable. Kidneys equal in size with symmetric enhancement. No nephrolithiasis, hydronephrosis, or focal enhancing renal mass. No hydroureter. Bladder partially distended without abnormality. Stomach/Bowel: Small hiatal hernia. Stomach otherwise unremarkable. No evidence for bowel obstruction. No acute inflammatory changes seen about the small bowel. Mild circumferential wall thickening about the distal colon, which may be related to underlying hepatic disease, although acute proctocolitis could also be considered. No other significant inflammatory changes about the bowels. Moderate amount of retained stool within the proximal colon. Appendix is normal. Vascular/Lymphatic: Normal intravascular enhancement seen throughout the abdomen and pelvis. Mildly prominent left inguinal lymph nodes measure up to 13 mm (series 2, image 86), indeterminate. This is increased from prior. No other significant adenopathy identified. Reproductive: Uterus is absent.  Ovaries stable in appearance. Other: No free air.  Moderate volume ascites. Musculoskeletal: Diffuse anasarca. Prominent degenerative spondylolysis about L4-5. No acute osseous abnormality. No worrisome lytic or blastic osseous lesions. IMPRESSION: 1. Cardiomegaly with evidence for right heart dysfunction. Associated congestive hepatopathy  with possible mild underlying cirrhosis, similar to previous. 2. Moderate volume ascites with diffuse anasarca. 3. Circumferential wall thickening about the distal colon with question of hazy inflammatory stranding. While this finding may in part be related underlying intrinsic liver disease, possible acute proctocolitis could also be considered. 4. Mildly prominent left inguinal lymph nodes measuring up to 13 mm, indeterminate. No other adenopathy identified. Electronically Signed   By: BeJeannine Boga.D.   On: 09/10/2016 04:13   [place]acentesis  Result Date: 09/10/2016 INDICATION: Ascites EXAM: ULTRASOUND GUIDED DIAGNOSTIC PARACENTESIS MEDICATIONS: None. COMPLICATIONS: None immediate. PROCEDURE: Informed written consent was obtained from the patient after a discussion of the risks, benefits and alternatives to treatment. A timeout was performed prior to the initiation of the procedure. Initial ultrasound scanning  demonstrates a moderate amount of ascites within the abdomen . The right mid abdomen was prepped and draped in the usual sterile fashion. 1% lidocaine with epinephrine was used for local anesthesia. Following this, a 19 gauge, 7-cm, Yueh catheter was introduced. An ultrasound image was saved for documentation purposes. The paracentesis was performed. The catheter was removed and a dressing was applied. The patient tolerated the procedure well without immediate post procedural complication. FINDINGS: A total of approximately 120 mL of clear yellow fluid was removed. Samples were sent to the laboratory as requested by the clinical team. IMPRESSION: Successful ultrasound-guided diagnostic paracentesis. Electronically Signed   By: Inez Catalina M.D.   On: 09/10/2016 10:34   CBC    Component Value Date/Time   WBC 7.1 09/18/2016 0819   RBC 3.81 09/18/2016 0819   HGB 10.2 (L) 09/18/2016 0819   HCT 32.1 (L) 09/18/2016 0819   PLT 232 09/18/2016 0819   MCV 84.4 09/18/2016 0819   MCH 26.7  09/18/2016 0819   MCHC 31.7 (L) 09/18/2016 0819   RDW 21.5 (H) 09/18/2016 0819   LYMPHSABS 1.3 07/31/2016 1239   MONOABS 0.3 07/31/2016 1239   EOSABS 0.1 07/31/2016 1239   BASOSABS 0.1 07/31/2016 1239    Hepatic Function Latest Ref Rng & Units 09/17/2016 09/09/2016 07/31/2016  Total Protein 6.5 - 8.1 g/dL 8.5(H) 7.8 8.1  Albumin 3.5 - 5.0 g/dL 3.8 3.3(L) 3.9  AST 15 - 41 U/L 25 27 88(H)  ALT 14 - 54 U/L 18 26 98(H)  Alk Phosphatase 38 - 126 U/L 85 84 107  Total Bilirubin 0.3 - 1.2 mg/dL 4.3(H) 2.4(H) 4.5(H)  Bilirubin, Direct 0.1 - 0.5 mg/dL 1.2(H) - -     Assessment and Plan:   Pegeen Stiger is a 51 y.o. y/o female has been referred for cirrhosis of the liver. She does have some radiological features of cirrhosis , Her platelet count, albumin and INR are normal indicating that she has good "liver synthetic function " . Last CT scan shows normal size of spleen suggesting that she definitely does not have severe portal hypertension. It is very likely that she has cardiac ascites as the primary driving factor. ( A total fluid protein would have helped aid in the diagnosis but wasn't performed the last time she had a paracentesis). Presently from her history has orthopnea, PND, Her JVD is raised .   Plan  1. Suggest to follow up with cardiology closely for management of her diuretics  2. Stressed on need for a low salt diet  3. Her pulse is still around 90/min, advised to be compliant with Coreg and could be uptitrated with cardiology.  4. From GI point of view she can have PRN abdominal paracentesis and Q 6 monthly liver ultrasound. At some point she may need an EGD to screen for esophageal varices when her CHF is at its best optimized .  5. Strongly suggested to set up appointment with a PCP to help coordinate care, update on vaccinations.  6. For proctocolitis - She would need atleast a flexible sigmoidoscopy when felt safe to undergo from cardiology to tolerate anesthesia.  7. Ascites-  obtain USG abdomen to determine how much fluid is present.   Follow up in 8-12 weeks   Dr Jonathon Bellows MD

## 2016-10-03 ENCOUNTER — Ambulatory Visit
Admission: RE | Admit: 2016-10-03 | Discharge: 2016-10-03 | Disposition: A | Discharge status: Home / Self Care | Payer: Medicare Other | Source: Ambulatory Visit | Attending: Gastroenterology | Admitting: Gastroenterology

## 2016-10-03 DIAGNOSIS — R188 Other ascites: Secondary | ICD-10-CM | POA: Insufficient documentation

## 2016-10-03 DIAGNOSIS — K7460 Unspecified cirrhosis of liver: Secondary | ICD-10-CM | POA: Diagnosis not present

## 2016-10-04 ENCOUNTER — Telehealth: Payer: Self-pay

## 2016-10-04 NOTE — Telephone Encounter (Signed)
Attempted to contact pt. VM was full. Will re-attempt.

## 2016-10-04 NOTE — Telephone Encounter (Signed)
-----   Message from Rayann Heman, [place] sent at 10/04/2016 10:30 AM EST -----   ----- Message ----- From: Wyline Mood, MD Sent: 10/03/2016  10:33 AM To: Rayann Heman, CMA  No ascites seen

## 2016-10-05 ENCOUNTER — Ambulatory Visit: Payer: Medicare Other | Attending: Family | Admitting: Family

## 2016-10-05 ENCOUNTER — Encounter: Payer: Self-pay | Admitting: Family

## 2016-10-05 VITALS — BP 91/55 | HR 74 | Resp 18 | Ht 65.0 in | Wt 153.2 lb

## 2016-10-05 DIAGNOSIS — Z8249 Family history of ischemic heart disease and other diseases of the circulatory system: Secondary | ICD-10-CM | POA: Insufficient documentation

## 2016-10-05 DIAGNOSIS — I110 Hypertensive heart disease with heart failure: Secondary | ICD-10-CM | POA: Insufficient documentation

## 2016-10-05 DIAGNOSIS — F319 Bipolar disorder, unspecified: Secondary | ICD-10-CM | POA: Insufficient documentation

## 2016-10-05 DIAGNOSIS — I5022 Chronic systolic (congestive) heart failure: Secondary | ICD-10-CM | POA: Insufficient documentation

## 2016-10-05 DIAGNOSIS — G2581 Restless legs syndrome: Secondary | ICD-10-CM | POA: Diagnosis not present

## 2016-10-05 DIAGNOSIS — K7460 Unspecified cirrhosis of liver: Secondary | ICD-10-CM | POA: Insufficient documentation

## 2016-10-05 DIAGNOSIS — Z87891 Personal history of nicotine dependence: Secondary | ICD-10-CM | POA: Diagnosis not present

## 2016-10-05 DIAGNOSIS — F4310 Post-traumatic stress disorder, unspecified: Secondary | ICD-10-CM | POA: Insufficient documentation

## 2016-10-05 DIAGNOSIS — J45909 Unspecified asthma, uncomplicated: Secondary | ICD-10-CM | POA: Diagnosis not present

## 2016-10-05 DIAGNOSIS — F329 Major depressive disorder, single episode, unspecified: Secondary | ICD-10-CM

## 2016-10-05 DIAGNOSIS — Z79899 Other long term (current) drug therapy: Secondary | ICD-10-CM | POA: Diagnosis not present

## 2016-10-05 DIAGNOSIS — I2510 Atherosclerotic heart disease of native coronary artery without angina pectoris: Secondary | ICD-10-CM | POA: Insufficient documentation

## 2016-10-05 DIAGNOSIS — Z7982 Long term (current) use of aspirin: Secondary | ICD-10-CM | POA: Diagnosis not present

## 2016-10-05 DIAGNOSIS — G4733 Obstructive sleep apnea (adult) (pediatric): Secondary | ICD-10-CM | POA: Diagnosis not present

## 2016-10-05 DIAGNOSIS — E119 Type 2 diabetes mellitus without complications: Secondary | ICD-10-CM | POA: Insufficient documentation

## 2016-10-05 DIAGNOSIS — I10 Essential (primary) hypertension: Secondary | ICD-10-CM

## 2016-10-05 DIAGNOSIS — I509 Heart failure, unspecified: Secondary | ICD-10-CM | POA: Diagnosis present

## 2016-10-05 MED ORDER — SACUBITRIL-VALSARTAN 24-26 MG PO TABS: 1.0000 | ORAL_TABLET | Freq: Two times a day (BID) | ORAL | 3 refills | Status: DC

## 2016-10-05 NOTE — Patient Instructions (Addendum)
Begin weighing daily and call for an overnight weight gain of > 2 pounds or a weekly weight gain of >5 pounds.   Finish out your lisinopril, skip one day and then begin entresto 24/26mg  twice daily.   Monitor blood pressure as we may need to decrease fluid pill.    Decrease fluid intake to 40-48 ounces of fluid daily.

## 2016-10-05 NOTE — Progress Notes (Signed)
Patient ID: Bonnie Ochoa, female    DOB: [birth year], 51 y.o.   MRN: [id number]  HPI  Ms Carolan is a 51 y/o female with a history of obstructive sleep apnea, restless leg syndrome, PTSD, HTN, DM, depression, CAD, cirrhosis, bipolar, asthma, marijuana use and chronic heart failure.   Last echo was done 05/28/16 and showed an EF of 20-25% along with moderate MR/TR and severely elevated PA pressure of 64 mm Hg.    Admitted 09/17/16 with HF exacerbation. Initially treated with IV diuretics and transitioned to oral diuretics. 120 cc of fluid removed from a paracentesis. Discharged home the following day. Admitted 09/10/16 with HF exacerbation along with abdominal pain. Abdominal CT showed worsening ascites and she had a paracentesis with fluid removal with improvement of her symptoms. Discharged home the next day. ED visit 07/31/16 due to viral syndrome. Evaluated and discharged home.   She presents today for her initial visit with fatigue and shortness of breath with minimal exertion. Symptoms do improve after she sits down and rests for a few minutes. Difficulty sleeping but she's unsure of why. Denies any swelling in her legs/abdomen. Has not been weighing herself daily but does have scales to use. Admits to drinking quite a bit of fluids because she has a dry mouth.   Past Medical History:  Diagnosis Date  . Asthma   . Bipolar 1 disorder ([facility])   . CHF (congestive heart failure) ([facility])   . Cirrhosis of liver ([facility])   . Coronary artery disease   . Depression   . Diabetes mellitus without complication ([facility])   . Hypertension   . PTSD (post-traumatic stress disorder)   . Restless leg syndrome   . Sleep apnea    Past Surgical History:  Procedure Laterality Date  . ABDOMINAL HYSTERECTOMY    . debribalator  2016  . hysterectmy  2011  . INSERT / REPLACE / REMOVE PACEMAKER    . INSERTION OF ICD    . TONSILECTOMY, ADENOIDECTOMY, BILATERAL MYRINGOTOMY AND TUBES    . TUBAL LIGATION  1980   Family  History  Problem Relation Age of Onset  . Hypertension Mother   . Hyperlipidemia Mother   . Heart disease Mother   . Hypertension Sister   . Alzheimer's disease Maternal Grandfather    Social History  Substance Use Topics  . Smoking status: Former Games developer  . Smokeless tobacco: Never Used  . Alcohol use No   No Known Allergies  . Prior to Admission medications   Medication Sig Start Date End Date Taking? Authorizing Provider  aspirin EC 81 MG tablet Take 1 tablet (81 mg total) by mouth daily. 06/04/16  Yes Enid Baas, MD  atorvastatin (LIPITOR) 40 MG tablet Take 1 tablet (40 mg total) by mouth daily. 08/21/16  Yes Antonieta Iba, MD  carvedilol (COREG) 6.25 MG tablet Take 1 tablet (6.25 mg total) by mouth 2 (two) times daily with a meal. 08/21/16  Yes Antonieta Iba, MD  furosemide (LASIX) 40 MG tablet Take 1.5 tablets (60 mg total) by mouth 2 (two) times daily. 08/21/16  Yes Antonieta Iba, MD  lisinopril (PRINIVIL,ZESTRIL) 20 MG tablet Take 0.5 tablets (10 mg total) by mouth daily. 09/11/16  Yes Auburn Bilberry, MD  nitroGLYCERIN (NITROSTAT) 0.4 MG SL tablet Place 1 tablet (0.4 mg total) under the tongue every 5 (five) minutes as needed for chest pain. 06/04/16  Yes Enid Baas, MD  pantoprazole (PROTONIX) 40 MG tablet Take 1 tablet (40 mg total) by  mouth daily. 05/24/16 05/24/17 Yes Darren Servando Snare, MD  PARoxetine (PAXIL) 30 MG tablet Take 1 tablet (30 mg total) by mouth daily. 06/04/16  Yes Enid Baas, MD  potassium chloride (K-DUR) 10 MEQ tablet Take 1 tablet (10 mEq total) by mouth 2 (two) times daily. Patient taking differently: Take 10 mEq by mouth daily.  08/21/16 08/21/17 Yes Antonieta Iba, MD  prazosin (MINIPRESS) 1 MG capsule Take 1 mg by mouth at bedtime.   Yes Historical Provider, MD  QUEtiapine (SEROQUEL) 300 MG tablet Take 1 tablet (300 mg total) by mouth at bedtime. 06/04/16  Yes Enid Baas, MD  spironolactone (ALDACTONE) 25 MG tablet Take 1 tablet  (25 mg total) by mouth daily. 08/21/16  Yes Antonieta Iba, MD    Review of Systems  Constitutional: Positive for appetite change (decreased) and fatigue.  HENT: Positive for congestion, nosebleeds and rhinorrhea. Negative for sore throat.   Eyes: Negative.   Respiratory: Positive for cough and shortness of breath. Negative for chest tightness.   Cardiovascular: Positive for chest pain (at times). Negative for palpitations and leg swelling.  Gastrointestinal: Negative for abdominal distention and abdominal pain.  Endocrine: Negative.   Genitourinary: Negative.   Musculoskeletal: Positive for arthralgias (knee pain). Negative for back pain.  Skin: Negative.   Allergic/Immunologic: Negative.   Neurological: Positive for light-headedness. Negative for dizziness.  Hematological: Negative for adenopathy. Does not bruise/bleed easily.  Psychiatric/Behavioral: Positive for dysphoric mood and sleep disturbance (sleeping on 2 pillows). Negative for suicidal ideas. The patient is nervous/anxious.    Vitals:   10/05/16 1017  BP: (!) 91/55  Pulse: 74  Resp: 18  SpO2: 100%  Weight: 153 lb 4 oz (69.5 kg)  Height: 5\' 5"  (1.651 m)   Wt Readings from Last 3 Encounters:  10/05/16 153 lb 4 oz (69.5 kg)  09/26/16 158 lb (71.7 kg)  09/17/16 156 lb 14.4 oz (71.2 kg)   Lab Results  Component Value Date   CREATININE 1.34 (H) 09/18/2016   CREATININE 1.20 (H) 09/17/2016   CREATININE 1.14 (H) 09/11/2016    Physical Exam  Constitutional: She is oriented to person, place, and time. She appears well-developed and well-nourished.  HENT:  Head: Normocephalic and atraumatic.  Eyes: Conjunctivae are normal. Pupils are equal, round, and reactive to light.  Neck: Normal range of motion. Neck supple. No JVD present.  Cardiovascular: Normal rate and regular rhythm.   Pulmonary/Chest: Effort normal. She has no wheezes. She has no rales.  Abdominal: Soft. She exhibits no distension. There is no tenderness.   Musculoskeletal: She exhibits no edema or tenderness.  Neurological: She is alert and oriented to person, place, and time.  Skin: Skin is warm and dry.  Psychiatric: She has a normal mood and affect. Her behavior is normal. Thought content normal.  Nursing note and vitals reviewed.   Assessment & Plan:  1: Chronic heart failure with reduced ejection fraction- - NYHA class III - euvolemic - not weighing daily. Instructed to start weighing daily every morning and write the weight down. She's to call for an overnight weight gain of >2 pounds or a weekly weight gain of >5 pounds - not adding salt to her food and is trying to eat low sodium foods. Discussed the importance of closely following a 2000mg  sodium diet and written dietary information was given to her about this. - was on entresto before and reports feeling "great" but couldn't afford it. Applied to [employer] while in the office and did receive  approval for patient. 30 day voucher given to get the first 30 days free of entresto 24/26mg   - advised her sister (who fills medication box and is an Charity fundraiser) that she should finish out the lisinopril, skip one day and then begin entresto 24/26mg  twice daily - drinking "lots" of fluids daily due to dry mouth. Discussed the importance of maintaining her fluid intake between 40-48 ounces of fluid daily. Can suck on sugar free candy - need to ask if [facility] is coming to the home; if not could refer to [facility] for assistance - PharmD went in and reviewed medications and went over entresto instructions again - will check a BMP at next visit - sees cardiologist Mariah Milling) 11/19/16  2: HTN- - BP on the low side - changing lisinopril to entresto once she finishes her lisinopril - advised sister to get a bp cuff and keep an eye on her blood pressure as her diuretic may need to be adjusted - did not have a PCP so patient was established with Adriana Simas on 10/23/16  3: Obstructive sleep apnea- -  does have CPAP at home but doesn't wear it as she says that her machine doesn't work - will have home health follow-up to see what is going on with the machine as this could be contributing to her fatigue  4: Depression- - does have a history of PTSD - no suicidal thoughts at this time  Return here in 1 month or sooner for any questions/problems before then.

## 2016-10-08 DIAGNOSIS — G4733 Obstructive sleep apnea (adult) (pediatric): Secondary | ICD-10-CM | POA: Insufficient documentation

## 2016-10-08 DIAGNOSIS — I5022 Chronic systolic (congestive) heart failure: Secondary | ICD-10-CM | POA: Insufficient documentation

## 2016-10-10 ENCOUNTER — Telehealth: Payer: Self-pay

## 2016-10-10 NOTE — Telephone Encounter (Signed)
Spoke to sister and requested callback from patient.   No information given except callback phone number.

## 2016-10-11 ENCOUNTER — Telehealth: Payer: Self-pay

## 2016-10-11 NOTE — Telephone Encounter (Signed)
-----   Message from Ginger Feldpausch, CMA sent at 10/04/2016 10:30 AM EST -----   ----- Message ----- From: Kiran Anna, MD Sent: 10/03/2016  10:33 AM To: Ginger Feldpausch, CMA  No ascites seen 

## 2016-10-11 NOTE — Telephone Encounter (Signed)
Advised pt of Dr. Johnney Killian notes.

## 2016-10-23 ENCOUNTER — Ambulatory Visit: Payer: Medicare Other | Admitting: Family Medicine

## 2016-10-31 ENCOUNTER — Ambulatory Visit: Payer: Medicare Other | Admitting: Family

## 2016-10-31 ENCOUNTER — Telehealth: Payer: Self-pay

## 2016-10-31 ENCOUNTER — Telehealth: Payer: Self-pay | Admitting: Family

## 2016-10-31 NOTE — Telephone Encounter (Signed)
Pt missed her appointment today with the CHF clinic. I called and left a message to reschedule appointment. I advised that Bonnie Ochoa was to have blood work drawn at this visit and that her new medication Sherryll Burger will run out and she needs to have labs drawn to continue that medication.

## 2016-10-31 NOTE — Telephone Encounter (Signed)
Patient did not show for her Heart Failure Clinic appointment on 10/31/16. Will attempt to reschedule.

## 2016-11-05 ENCOUNTER — Encounter: Payer: Self-pay | Admitting: Family

## 2016-11-05 ENCOUNTER — Ambulatory Visit: Payer: Medicare Other | Attending: Family | Admitting: Family

## 2016-11-05 VITALS — BP 112/84 | HR 76 | Resp 18 | Ht 65.0 in | Wt 162.0 lb

## 2016-11-05 DIAGNOSIS — J45909 Unspecified asthma, uncomplicated: Secondary | ICD-10-CM | POA: Insufficient documentation

## 2016-11-05 DIAGNOSIS — G4733 Obstructive sleep apnea (adult) (pediatric): Secondary | ICD-10-CM | POA: Diagnosis not present

## 2016-11-05 DIAGNOSIS — I2510 Atherosclerotic heart disease of native coronary artery without angina pectoris: Secondary | ICD-10-CM | POA: Insufficient documentation

## 2016-11-05 DIAGNOSIS — F4310 Post-traumatic stress disorder, unspecified: Secondary | ICD-10-CM | POA: Diagnosis not present

## 2016-11-05 DIAGNOSIS — I110 Hypertensive heart disease with heart failure: Secondary | ICD-10-CM | POA: Diagnosis not present

## 2016-11-05 DIAGNOSIS — F329 Major depressive disorder, single episode, unspecified: Secondary | ICD-10-CM

## 2016-11-05 DIAGNOSIS — Z95810 Presence of automatic (implantable) cardiac defibrillator: Secondary | ICD-10-CM | POA: Insufficient documentation

## 2016-11-05 DIAGNOSIS — I5022 Chronic systolic (congestive) heart failure: Secondary | ICD-10-CM | POA: Diagnosis not present

## 2016-11-05 DIAGNOSIS — G2581 Restless legs syndrome: Secondary | ICD-10-CM | POA: Diagnosis not present

## 2016-11-05 DIAGNOSIS — I10 Essential (primary) hypertension: Secondary | ICD-10-CM

## 2016-11-05 DIAGNOSIS — Z79899 Other long term (current) drug therapy: Secondary | ICD-10-CM | POA: Insufficient documentation

## 2016-11-05 DIAGNOSIS — E119 Type 2 diabetes mellitus without complications: Secondary | ICD-10-CM | POA: Insufficient documentation

## 2016-11-05 DIAGNOSIS — Z8249 Family history of ischemic heart disease and other diseases of the circulatory system: Secondary | ICD-10-CM | POA: Insufficient documentation

## 2016-11-05 DIAGNOSIS — Z87891 Personal history of nicotine dependence: Secondary | ICD-10-CM | POA: Insufficient documentation

## 2016-11-05 DIAGNOSIS — Z7982 Long term (current) use of aspirin: Secondary | ICD-10-CM | POA: Diagnosis not present

## 2016-11-05 DIAGNOSIS — K7460 Unspecified cirrhosis of liver: Secondary | ICD-10-CM | POA: Diagnosis not present

## 2016-11-05 LAB — BASIC METABOLIC PANEL
Anion gap: 7 (ref 5–15)
BUN: 22 mg/dL — AB (ref 6–20)
CALCIUM: 9.5 mg/dL (ref 8.9–10.3)
CO2: 28 mmol/L (ref 22–32)
CREATININE: 0.81 mg/dL (ref 0.44–1.00)
Chloride: 101 mmol/L (ref 101–111)
GFR calc Af Amer: >60 mL/min (ref >60)
GFR calc non Af Amer: >60 mL/min (ref >60)
GLUCOSE: 153 mg/dL — AB (ref 65–99)
Potassium: 3.9 mmol/L (ref 3.5–5.1)
Sodium: 136 mmol/L (ref 135–145)

## 2016-11-05 NOTE — Progress Notes (Signed)
Patient ID: Bonnie Ochoa, female    DOB: [birth year], 51 y.o.   MRN: [id number]  HPI Bonnie Ochoa is a 51 y/o female with a history of obstructive sleep apnea, restless leg syndrome, PTSD, HTN, DM, depression, CAD, cirrhosis, bipolar, asthma, marijuana use and chronic heart failure.   Last echo was done 05/28/16 and showed an EF of 20-25% along with moderate MR/TR and severely elevated PA pressure of 64 mm Hg.    Admitted 09/17/16 with HF exacerbation. Initially treated with IV diuretics and transitioned to oral diuretics. 120 cc of fluid removed from a paracentesis. Discharged home the following day. Admitted 09/10/16 with HF exacerbation along with abdominal pain. Abdominal CT showed worsening ascites and she had a paracentesis with fluid removal with improvement of her symptoms. Discharged home the next day. ED visit 07/31/16 due to viral syndrome. Evaluated and discharged home.   She presents today for her follow-up visit feeling much better than last visit. Can take the stairs now without SOB. Has been eating and sleeping much better since last visit. Denies any swelling in her legs/abdomen. Overall appears to be very happy with her health this visit.  Past Medical History:  Diagnosis Date  . Asthma   . Bipolar 1 disorder (HCC)   . CHF (congestive heart failure) (HCC)   . Cirrhosis of liver (HCC)   . Coronary artery disease   . Depression   . Diabetes mellitus without complication (HCC)   . Hypertension   . PTSD (post-traumatic stress disorder)   . Restless leg syndrome   . Sleep apnea    Past Surgical History:  Procedure Laterality Date  . ABDOMINAL HYSTERECTOMY    . debribalator  2016  . hysterectmy  2011  . INSERT / REPLACE / REMOVE PACEMAKER    . INSERTION OF ICD    . TONSILECTOMY, ADENOIDECTOMY, BILATERAL MYRINGOTOMY AND TUBES    . TUBAL LIGATION  1980   Family History  Problem Relation Age of Onset  . Hypertension Mother   . Hyperlipidemia Mother   . Heart disease Mother    . Hypertension Sister   . Alzheimer's disease Maternal Grandfather    Social History  Substance Use Topics  . Smoking status: Former Games developer  . Smokeless tobacco: Never Used  . Alcohol use No   No Known Allergies Prior to Admission medications   Medication Sig Start Date End Date Taking? Authorizing Provider  aspirin EC 81 MG tablet Take 1 tablet (81 mg total) by mouth daily. 06/04/16  Yes Enid Baas, MD  atorvastatin (LIPITOR) 40 MG tablet Take 1 tablet (40 mg total) by mouth daily. 08/21/16  Yes Antonieta Iba, MD  carvedilol (COREG) 6.25 MG tablet Take 1 tablet (6.25 mg total) by mouth 2 (two) times daily with a meal. 08/21/16  Yes Antonieta Iba, MD  furosemide (LASIX) 40 MG tablet Take 1.5 tablets (60 mg total) by mouth 2 (two) times daily. 08/21/16  Yes Antonieta Iba, MD  nitroGLYCERIN (NITROSTAT) 0.4 MG SL tablet Place 1 tablet (0.4 mg total) under the tongue every 5 (five) minutes as needed for chest pain. 06/04/16  Yes Enid Baas, MD  pantoprazole (PROTONIX) 40 MG tablet Take 1 tablet (40 mg total) by mouth daily. 05/24/16 05/24/17 Yes Darren Servando Snare, MD  PARoxetine (PAXIL) 30 MG tablet Take 1 tablet (30 mg total) by mouth daily. 06/04/16  Yes Enid Baas, MD  potassium chloride (K-DUR) 10 MEQ tablet Take 1 tablet (10 mEq total) by mouth 2 (two)  times daily. Patient taking differently: Take 10 mEq by mouth daily.  08/21/16 08/21/17 Yes Antonieta Iba, MD  prazosin (MINIPRESS) 1 MG capsule Take 1 mg by mouth at bedtime.   Yes Historical Provider, MD  QUEtiapine (SEROQUEL) 300 MG tablet Take 1 tablet (300 mg total) by mouth at bedtime. 06/04/16  Yes Enid Baas, MD  sacubitril-valsartan (ENTRESTO) 24-26 MG Take 1 tablet by mouth 2 (two) times daily. 10/05/16  Yes Delma Freeze, FNP  spironolactone (ALDACTONE) 25 MG tablet Take 1 tablet (25 mg total) by mouth daily. 08/21/16  Yes Antonieta Iba, MD     Review of Systems  Constitutional: Negative for appetite  change and fatigue.  HENT: Negative for congestion and postnasal drip.   Eyes: Negative.   Respiratory: Negative for cough, chest tightness and shortness of breath.   Cardiovascular: Negative for chest pain, palpitations and leg swelling.  Gastrointestinal: Negative for abdominal distention, abdominal pain, nausea and vomiting.  Endocrine: Negative.   Genitourinary: Negative.   Musculoskeletal: Positive for back pain. Negative for neck pain.       Lower back pain for few months  Skin: Negative.   Neurological: Positive for light-headedness. Negative for weakness and headaches.  Hematological: Negative for adenopathy. Does not bruise/bleed easily.  Psychiatric/Behavioral: Positive for dysphoric mood. Negative for sleep disturbance and suicidal ideas. The patient is not nervous/anxious.        Sleeping with 2 pillows    Physical Exam  Constitutional: She is oriented to person, place, and time. She appears well-developed and well-nourished.  HENT:  Head: Normocephalic and atraumatic.  Eyes: Conjunctivae are normal. Pupils are equal, round, and reactive to light.  Neck: Normal range of motion. Neck supple. No JVD present.  Cardiovascular: Normal rate and regular rhythm.   Pulmonary/Chest: Effort normal. She has no wheezes. She has no rales.  Abdominal: Soft. She exhibits no distension. There is no tenderness.  Musculoskeletal: She exhibits no edema or tenderness.  Neurological: She is alert and oriented to person, place, and time.  Skin: Skin is warm and dry.  Psychiatric: She has a normal mood and affect. Her behavior is normal.  Nursing note and vitals reviewed.   Vitals:   11/05/16 1103  BP: 112/84  Pulse: 76  Resp: 18  SpO2: 100%  Weight: 162 lb (73.5 kg)  Height: 5\' 5"  (1.651 m)   Wt Readings from Last 3 Encounters:  11/05/16 162 lb (73.5 kg)  10/05/16 153 lb 4 oz (69.5 kg)  09/26/16 158 lb (71.7 kg)    1: Chronic heart failure with reduced ejection fraction- - NYHA  class I - euvolemic - weighing daily. Instructed to call for an overnight weight gain of >2 pounds or a weekly weight gain of >5 pounds - not adding salt to her food and is trying to eat low sodium foods. Discussed the importance of closely following a 2000mg  sodium diet. - Pt was started on Entresto 24/26mg  last visit and says she feels so much better now - not having as much dry mouth as last visit, encouraged 40-50 oz of fluid a day - Pt says she doesn't need home health to come to house anymore because she's feeling so much better - will check a BMP today - will consider increasing Entresto dose at next visit - sees cardiologist Mariah Milling) 11/19/16  2: HTN- - BP on the low side - Experiencing orthostatic hypotension, will decrease furosemide 60 mg bid to 40 mg bid - sees PCP on 11/12/16  3: Obstructive sleep apnea- - does have CPAP at home but doesn't wear it as she says that her machine doesn't work - Appt to see about CPAP at end of May  4: Depression- - Diagnosed with bipolar depression while living in [place], also has history of PTSD - Encouraged patient to visit [facility] to get established with psychiatry - Currently not on SSRI/SNRI but is taking paroxetine, quetiapine, prazosin  - no suicidal/homicidal thoughts at this time although she did express becoming very angry with her girlfriend a couple of weeks ago. Claims she does not have a history of violence   Return here in 1 month or sooner for any questions/problems before then. Encouraged patient to bring medications to every visit.  Horris Latino, PharmD Pharmacy Resident 11/05/2016 12:45 PM

## 2016-11-12 ENCOUNTER — Ambulatory Visit: Payer: Medicare (Managed Care) | Admitting: Family Medicine

## 2016-11-17 NOTE — Progress Notes (Signed)
Cardiology Office Note  Date:  11/19/2016   ID:  Bonnie Ochoa, DOB 12-30-1965, MRN [id number]  PCP:  Tommie Sams, DO   Chief Complaint  Patient presents with  . other    3 month follow up. Meds reviewed by the pt. verbally. Pt. c/o pain all over body, nausea, sharp pains in chest, wakes ups soaking wet and has a feeling of sensitive to the touch.      HPI:  Bonnie Ochoa is a pleasant 51 year old woman arrived from [place] In 2017 with notes in the computer detailing history of  Medical and appt noncompliance, Substance abuse-chronic /marijuana bipolar,   anxiety diabetes,  hypertension,  asthma,  Obstructive sleep apnea, restless leg syndrome Nonischemic cardiomyopathy,  history of ICD,  ejection fraction 25% anemia Previous alcohol problem (patient denies any coronary disease on prior cardiac catheterization)  ascites, cirrhosis, History of paracentesis Previously on pain medication at home, oxycodone She presents today for follow-up of her chronic systolic CHF  Reports on arrival having diffuse arm pain, back pain Weight home 157, She feels baseline weight 153 at home Here 166. She feels extra weight is from her shoes Denies any leg swelling, shortness of breath when climbing stairs  Hospital admission 09/10/16 numerous sx on arrival to ER chest pain, shortness of breath, abdominal pain, dysuria, urinary frequency, syncope 2 today with lightheadedness, nausea and vomiting, and diarrhea , sob TBili 2.4, had paracentesis, diuresis D/c on lasix 60 BID  Hospital admission 09/17/16: chest pain, ABD pain fevers, chills, chest pain, shortness of breath, vomiting and diarrhea. "ran out of meds" HTN, acute on chronic systolic CHF  Scheduled to see primary care Her normal caretaker/Family member/partner not with her today, different family member resents with her who does not do her medications  Other past medical history reviewed Seen in the emergency room 05/19/2016 for abdominal  pain, chest pain BNP in the hospital 3700  acute on chronic CHF, Had 20 L diuresis  Since her last clinic visit she has been to the emergency room in December 2017 for nausea, vomiting, URI symptoms  CT scan consistent with moderate abdominal ascites, fatty liver Aortic atherosclerosis Used to drink alcohol, no longer drinks   PMH:   has a past medical history of Asthma; Bipolar 1 disorder (HCC); CHF (congestive heart failure) (HCC); Cirrhosis of liver (HCC); Coronary artery disease; Depression; Diabetes mellitus without complication (HCC); Hypertension; PTSD (post-traumatic stress disorder); Restless leg syndrome; and Sleep apnea.  PSH:    Past Surgical History:  Procedure Laterality Date  . ABDOMINAL HYSTERECTOMY    . debribalator  2016  . hysterectmy  2011  . INSERT / REPLACE / REMOVE PACEMAKER    . INSERTION OF ICD    . TONSILECTOMY, ADENOIDECTOMY, BILATERAL MYRINGOTOMY AND TUBES    . TUBAL LIGATION  1980    Current Outpatient Prescriptions  Medication Sig Dispense Refill  . aspirin EC 81 MG tablet Take 1 tablet (81 mg total) by mouth daily. 30 tablet 2  . atorvastatin (LIPITOR) 40 MG tablet Take 1 tablet (40 mg total) by mouth daily. 90 tablet 3  . carvedilol (COREG) 6.25 MG tablet Take 1 tablet (6.25 mg total) by mouth 2 (two) times daily with a meal. 180 tablet 3  . furosemide (LASIX) 40 MG tablet Take 1.5 tablets (60 mg total) by mouth 2 (two) times daily. 270 tablet 3  . nitroGLYCERIN (NITROSTAT) 0.4 MG SL tablet Place 1 tablet (0.4 mg total) under the tongue every 5 (five) minutes  as needed for chest pain. 30 tablet 12  . pantoprazole (PROTONIX) 40 MG tablet Take 1 tablet (40 mg total) by mouth daily. 30 tablet 5  . PARoxetine (PAXIL) 30 MG tablet Take 1 tablet (30 mg total) by mouth daily. 30 tablet 2  . potassium chloride (K-DUR) 10 MEQ tablet Take 1 tablet (10 mEq total) by mouth 2 (two) times daily. (Patient taking differently: Take 10 mEq by mouth daily. ) 180  tablet 3  . prazosin (MINIPRESS) 1 MG capsule Take 1 mg by mouth at bedtime.    Marland Kitchen QUEtiapine (SEROQUEL) 300 MG tablet Take 1 tablet (300 mg total) by mouth at bedtime. 30 tablet 2  . sacubitril-valsartan (ENTRESTO) 24-26 MG Take 1 tablet by mouth 2 (two) times daily. 60 tablet 3  . spironolactone (ALDACTONE) 25 MG tablet Take 1 tablet (25 mg total) by mouth daily. 90 tablet 3   No current facility-administered medications for this visit.      Allergies:   Patient has no known allergies.   Social History:  The patient  reports that she has quit smoking. She has never used smokeless tobacco. She reports that she uses drugs, including Marijuana. She reports that she does not drink alcohol.   Family History:   family history includes Alzheimer's disease in her maternal grandfather; Heart disease in her mother; Hyperlipidemia in her mother; Hypertension in her mother and sister.    Review of Systems: Review of Systems  Constitutional: Negative.   Respiratory: Positive for shortness of breath.   Cardiovascular: Negative.   Gastrointestinal: Negative.   Musculoskeletal: Negative.        Arm pain, back pain  Neurological: Negative.   Psychiatric/Behavioral: Negative.   All other systems reviewed and are negative.    PHYSICAL EXAM: VS:  BP 110/70 (BP Location: Left Arm, Patient Position: Sitting, Cuff Size: Normal)   Pulse 76   Ht 5\' 5"  (1.651 m)   Wt 166 lb 8 oz (75.5 kg)   BMI 27.71 kg/m  , BMI Body mass index is 27.71 kg/m. GEN: Well nourished, well developed, in no acute distress  HEENT: normal  Neck: no JVD, carotid bruits, or masses Cardiac: RRR; no murmurs, rubs, or gallops,no edema  Respiratory:  clear to auscultation bilaterally, normal work of breathing GI: soft, nontender, nondistended, + BS MS: no deformity or atrophy  Skin: warm and dry, no rash Neuro:  Strength and sensation are intact Psych: euthymic mood, full affect    Recent Labs: 09/17/2016: ALT 18; B  Natriuretic Peptide >4,500.0 09/18/2016: Hemoglobin 10.2; Magnesium 2.0; Platelets 232 11/05/2016: BUN 22; Creatinine, Ser 0.81; Potassium 3.9; Sodium 136    Lipid Panel No results found for: CHOL, HDL, LDLCALC, TRIG    Wt Readings from Last 3 Encounters:  11/19/16 166 lb 8 oz (75.5 kg)  11/05/16 162 lb (73.5 kg)  10/05/16 153 lb 4 oz (69.5 kg)       ASSESSMENT AND PLAN:  Chronic systolic heart failure (HCC) Weight up dramatically on office visit today but she reports weight is 157 at home Goal weight typically 153 pounds Unclear why it is 166 on today's visit in the office Stressed the importance of compliance with Lasix 60 mg twice a day Given she is in and out of the emergency room with CHF exacerbation, We have recommended she take metolazone 2.5 up to 5 mg sparingly for weight more than 160 pounds on her scale at home  Essential hypertension Blood pressure is low, unable to titrate  entresto We will continue current medications  Pulmonary hypertension Recommended she call our office for worsening shortness of breath, weight gain not relieved with Lasix and metolazone  NICM (nonischemic cardiomyopathy) (HCC) Recommended alcohol cessation, avoid drugs of abuse We'll continue entresto, Aldactone, beta blocker  Obstructive sleep apnea  Generalized abdominal pain  Diabetes mellitus without complication (HCC) We have encouraged continued exercise, careful diet management in an effort to lose weight.  Bipolar 1 disorder (HCC)  Other ascites  Alcoholic cirrhosis of liver with ascites (HCC)  Noncompliance Stressed importance of compliance of her medications Typically will present with family member to her visits, family tends to do her medications  Disposition:   F/U  6 months  No orders of the defined types were placed in this encounter.   Total encounter time more than 25 minutes  Greater than 50% was spent in counseling and coordination of care with the  patient   Signed, Dossie Arbour, M.D., Ph.D. 11/19/2016  [facility] [place], [place] [phone]

## 2016-11-19 ENCOUNTER — Ambulatory Visit (INDEPENDENT_AMBULATORY_CARE_PROVIDER_SITE_OTHER): Payer: Medicare Other | Admitting: *Deleted

## 2016-11-19 ENCOUNTER — Encounter: Payer: Self-pay | Admitting: Cardiovascular Disease

## 2016-11-19 ENCOUNTER — Ambulatory Visit (INDEPENDENT_AMBULATORY_CARE_PROVIDER_SITE_OTHER): Payer: Medicare Other | Admitting: Cardiovascular Disease

## 2016-11-19 VITALS — BP 110/70 | HR 76 | Ht 65.0 in | Wt 166.5 lb

## 2016-11-19 DIAGNOSIS — K7031 Alcoholic cirrhosis of liver with ascites: Secondary | ICD-10-CM

## 2016-11-19 DIAGNOSIS — G4733 Obstructive sleep apnea (adult) (pediatric): Secondary | ICD-10-CM

## 2016-11-19 DIAGNOSIS — I428 Other cardiomyopathies: Secondary | ICD-10-CM

## 2016-11-19 DIAGNOSIS — R1084 Generalized abdominal pain: Secondary | ICD-10-CM

## 2016-11-19 DIAGNOSIS — I5022 Chronic systolic (congestive) heart failure: Secondary | ICD-10-CM

## 2016-11-19 DIAGNOSIS — Z9119 Patient's noncompliance with other medical treatment and regimen: Secondary | ICD-10-CM

## 2016-11-19 DIAGNOSIS — E119 Type 2 diabetes mellitus without complications: Secondary | ICD-10-CM | POA: Diagnosis not present

## 2016-11-19 DIAGNOSIS — I10 Essential (primary) hypertension: Secondary | ICD-10-CM

## 2016-11-19 DIAGNOSIS — I272 Pulmonary hypertension, unspecified: Secondary | ICD-10-CM | POA: Diagnosis not present

## 2016-11-19 DIAGNOSIS — Z91199 Patient's noncompliance with other medical treatment and regimen due to unspecified reason: Secondary | ICD-10-CM

## 2016-11-19 DIAGNOSIS — F319 Bipolar disorder, unspecified: Secondary | ICD-10-CM | POA: Diagnosis not present

## 2016-11-19 DIAGNOSIS — Z95810 Presence of automatic (implantable) cardiac defibrillator: Secondary | ICD-10-CM | POA: Diagnosis not present

## 2016-11-19 DIAGNOSIS — R188 Other ascites: Secondary | ICD-10-CM | POA: Diagnosis not present

## 2016-11-19 MED ORDER — METOLAZONE 5 MG PO TABS: 5.0000 mg | ORAL_TABLET | Freq: Every day | ORAL | 3 refills | Status: DC | PRN

## 2016-11-19 NOTE — Progress Notes (Signed)
Referred to ICM clinic by Azalia Bilis, Device RN, patient of Dr Koren Bound.   ICM intro given during office visit with Dr Mariah Milling today.  Patient agreed to monthly ICM calls and ICM remote transmission scheduled for 12/20/2016.  Reviewed 11/19/2016 transmission with patient in the office today.  Provided ICM number.

## 2016-11-19 NOTE — Progress Notes (Signed)
EPIC Encounter for ICM Monitoring  Patient Name: Bonnie Ochoa is a 51 y.o. female Date: 11/19/2016 Primary Care Physican: Coral Spikes, DO Primary Cardiologist: Rockey Situ Electrophysiologist: Caryl Comes Dry Weight: Office weight 166 lbs       1st ICM encounter in the office visit with Dr Rockey Situ today.  She is asymptomatic for fluid today but does have generalized pain for the last several days.   Thoracic impedance normal but was abnormal suggesting fluid accumulation x 8 days from 11/04/2016 to 11/12/2016.  Prescribed dosage: Furosemide 40 mg 1.5 tablets (60 mg total) by mouth 2 (two) times daily.  Potassium 10 mEq 1 tablet (10 mEq total) by mouth 2 (two) times daily.  Labs: 11/05/2016 Creatinine 0.81, BUN 22, Potassium 3.9, Sodium 136, EGFR >60 09/18/2016 Creatinine 1.34, BUN 28, Potassium 3.3, Sodium 138, EGFR 45-52  09/17/2016 Creatinine 1.20, BUN 24, Potassium 3.4, Sodium 138, EGFR 51-59  09/11/2016 Creatinine 1.14, BUN 22, Potassium 3.3, Sodium 138, EGFR 54->60  09/09/2016 Creatinine 1.13, BUN 24, Potassium 3.6, Sodium 136, EGFR 55->60   Recommendations:  Discussed diet and advised to review food labels for amount of sodium and to limit to 2000 mg/day and fluid intake to < 2 liters/day.  She said she does not always follow low salt diet.  Provided ICM number and encouraged to call for fluid symptoms.  Follow-up plan: ICM clinic phone appointment on 12/20/2016.    Copy of ICM check sent to primary cardiologist and device physician.   3 month ICM trend: 11/19/2016   1 Year ICM trend:      Rosalene Billings, RN 11/19/2016 11:08 AM

## 2016-11-19 NOTE — Patient Instructions (Addendum)
Medication Instructions:   Continue lasix 60 twice a day (1 1/2 pills of the 40 mg pill)  For emergency,  Weight >160 or full of fluid, shortness of breath, ABD tight, Take metolazone 1/2 or whole pill 30 min before the morning lasix  Take additional 2 potassium pills  Labwork:  No new labs needed  Testing/Procedures:  No further testing at this time   I recommend watching educational videos on topics of interest to you at:       [email or web address].goemmi.com  Enter code: [email or web address]    Follow-Up: It was a pleasure seeing you in the office today. Please call us if you have new issues that need to be addressed before your next appt.  [phone]  Your physician wants you to follow-up in: 3 months.  You will receive a reminder letter in the mail two months in advance. If you don't receive a letter, please call our office to schedule the follow-up appointment.  If you need a refill on your cardiac medications before your next appointment, please call your pharmacy.

## 2016-11-20 NOTE — Progress Notes (Signed)
Remote ICD transmission.   

## 2016-11-21 ENCOUNTER — Encounter: Payer: Self-pay | Admitting: Cardiology

## 2016-11-23 LAB — CUP PACEART REMOTE DEVICE CHECK
Date Time Interrogation Session: 20180409060015
HighPow Impedance: 53 Ohm
HighPow Impedance: 53 Ohm
Implantable Lead Location: 753860
Implantable Pulse Generator Implant Date: 20160410
Lead Channel Pacing Threshold Pulse Width: 0.5 ms
Lead Channel Setting Pacing Amplitude: 2.5 V
Lead Channel Setting Pacing Pulse Width: 0.5 ms
Lead Channel Setting Sensing Sensitivity: 0.5 mV
MDC IDC LEAD IMPLANT DT: 20160410
MDC IDC MSMT BATTERY REMAINING LONGEVITY: 91 mo
MDC IDC MSMT BATTERY REMAINING PERCENTAGE: 83 %
MDC IDC MSMT BATTERY VOLTAGE: 3.05 V
MDC IDC MSMT LEADCHNL RV IMPEDANCE VALUE: 390 Ohm
MDC IDC MSMT LEADCHNL RV PACING THRESHOLD AMPLITUDE: 1 V
MDC IDC MSMT LEADCHNL RV SENSING INTR AMPL: 11.8 mV
MDC IDC STAT BRADY RV PERCENT PACED: 1 %
Pulse Gen Serial Number: 7259964

## 2016-12-04 ENCOUNTER — Encounter: Payer: Self-pay | Admitting: Family

## 2016-12-04 ENCOUNTER — Ambulatory Visit: Payer: Medicare Other | Attending: Family | Admitting: Family

## 2016-12-04 VITALS — BP 128/77 | HR 83 | Resp 20 | Ht 65.0 in | Wt 159.1 lb

## 2016-12-04 DIAGNOSIS — E119 Type 2 diabetes mellitus without complications: Secondary | ICD-10-CM | POA: Insufficient documentation

## 2016-12-04 DIAGNOSIS — G4733 Obstructive sleep apnea (adult) (pediatric): Secondary | ICD-10-CM | POA: Diagnosis not present

## 2016-12-04 DIAGNOSIS — I509 Heart failure, unspecified: Secondary | ICD-10-CM | POA: Diagnosis present

## 2016-12-04 DIAGNOSIS — F319 Bipolar disorder, unspecified: Secondary | ICD-10-CM | POA: Diagnosis not present

## 2016-12-04 DIAGNOSIS — J45909 Unspecified asthma, uncomplicated: Secondary | ICD-10-CM | POA: Insufficient documentation

## 2016-12-04 DIAGNOSIS — Z7982 Long term (current) use of aspirin: Secondary | ICD-10-CM | POA: Insufficient documentation

## 2016-12-04 DIAGNOSIS — Z87891 Personal history of nicotine dependence: Secondary | ICD-10-CM | POA: Insufficient documentation

## 2016-12-04 DIAGNOSIS — I110 Hypertensive heart disease with heart failure: Secondary | ICD-10-CM | POA: Insufficient documentation

## 2016-12-04 DIAGNOSIS — I5022 Chronic systolic (congestive) heart failure: Secondary | ICD-10-CM

## 2016-12-04 DIAGNOSIS — G2581 Restless legs syndrome: Secondary | ICD-10-CM | POA: Diagnosis not present

## 2016-12-04 DIAGNOSIS — F329 Major depressive disorder, single episode, unspecified: Secondary | ICD-10-CM

## 2016-12-04 DIAGNOSIS — K7460 Unspecified cirrhosis of liver: Secondary | ICD-10-CM | POA: Diagnosis not present

## 2016-12-04 DIAGNOSIS — F4310 Post-traumatic stress disorder, unspecified: Secondary | ICD-10-CM | POA: Insufficient documentation

## 2016-12-04 DIAGNOSIS — Z79899 Other long term (current) drug therapy: Secondary | ICD-10-CM | POA: Insufficient documentation

## 2016-12-04 DIAGNOSIS — Z8249 Family history of ischemic heart disease and other diseases of the circulatory system: Secondary | ICD-10-CM | POA: Diagnosis not present

## 2016-12-04 DIAGNOSIS — I10 Essential (primary) hypertension: Secondary | ICD-10-CM

## 2016-12-04 DIAGNOSIS — I2510 Atherosclerotic heart disease of native coronary artery without angina pectoris: Secondary | ICD-10-CM | POA: Insufficient documentation

## 2016-12-04 LAB — BASIC METABOLIC PANEL
ANION GAP: 8 (ref 5–15)
BUN: 30 mg/dL — AB (ref 6–20)
CALCIUM: 10.1 mg/dL (ref 8.9–10.3)
CO2: 28 mmol/L (ref 22–32)
Chloride: 101 mmol/L (ref 101–111)
Creatinine, Ser: 1.18 mg/dL — ABNORMAL HIGH (ref 0.44–1.00)
GFR calc non Af Amer: 52 mL/min — ABNORMAL LOW (ref >60)
Glucose, Bld: 190 mg/dL — ABNORMAL HIGH (ref 65–99)
POTASSIUM: 3.9 mmol/L (ref 3.5–5.1)
SODIUM: 137 mmol/L (ref 135–145)

## 2016-12-04 NOTE — Patient Instructions (Signed)
Continue weighing daily and call for an overnight weight gain of > 2 pounds or a weekly weight gain of >5 pounds. 

## 2016-12-04 NOTE — Progress Notes (Signed)
Patient ID: Bonnie Ochoa, female    DOB: [birth year], 51 y.o.   MRN: [id number]  HPI  Bonnie Ochoa is a 51 y/o female with a history of obstructive sleep apnea, restless leg syndrome, PTSD, HTN, DM, depression, CAD, cirrhosis, bipolar, asthma, marijuana use and chronic heart failure.   Last echo was done 05/28/16 and showed an EF of 20-25% along with moderate MR/TR and severely elevated PA pressure of 64 mm Hg.    Admitted 09/17/16 with HF exacerbation. Initially treated with IV diuretics and transitioned to oral diuretics. 120 cc of fluid removed from a paracentesis. Discharged home the following day. Admitted 09/10/16 with HF exacerbation along with abdominal pain. Abdominal CT showed worsening ascites and she had a paracentesis with fluid removal with improvement of her symptoms. Discharged home the next day. ED visit 07/31/16 due to viral syndrome. Evaluated and discharged home.   She presents today for her follow-up visit with a chief complaint of mild fatigue upon moderate exertion. She describes it as chronic in nature and has been occurring over the last several months. She does say that her energy level is improving. Has associated headaches, light-headedness and difficulty sleeping.   Past Medical History:  Diagnosis Date  . Asthma   . Bipolar 1 disorder (HCC)   . CHF (congestive heart failure) (HCC)   . Cirrhosis of liver (HCC)   . Coronary artery disease   . Depression   . Diabetes mellitus without complication (HCC)   . Hypertension   . PTSD (post-traumatic stress disorder)   . Restless leg syndrome   . Sleep apnea    Past Surgical History:  Procedure Laterality Date  . ABDOMINAL HYSTERECTOMY    . debribalator  2016  . hysterectmy  2011  . INSERT / REPLACE / REMOVE PACEMAKER    . INSERTION OF ICD    . TONSILECTOMY, ADENOIDECTOMY, BILATERAL MYRINGOTOMY AND TUBES    . TUBAL LIGATION  1980   Family History  Problem Relation Age of Onset  . Hypertension Mother   .  Hyperlipidemia Mother   . Heart disease Mother   . Hypertension Sister   . Alzheimer's disease Maternal Grandfather    Social History  Substance Use Topics  . Smoking status: Former Games developer  . Smokeless tobacco: Never Used  . Alcohol use No   No Known Allergies  . Prior to Admission medications   Medication Sig Start Date End Date Taking? Authorizing Provider  aspirin EC 81 MG tablet Take 1 tablet (81 mg total) by mouth daily. 06/04/16  Yes Enid Baas, MD  atorvastatin (LIPITOR) 40 MG tablet Take 1 tablet (40 mg total) by mouth daily. 08/21/16  Yes Antonieta Iba, MD  carvedilol (COREG) 6.25 MG tablet Take 1 tablet (6.25 mg total) by mouth 2 (two) times daily with a meal. 08/21/16  Yes Antonieta Iba, MD  furosemide (LASIX) 40 MG tablet Take 1.5 tablets (60 mg total) by mouth 2 (two) times daily. 08/21/16  Yes Antonieta Iba, MD  lisinopril (PRINIVIL,ZESTRIL) 20 MG tablet Take 0.5 tablets (10 mg total) by mouth daily. 09/11/16  Yes Auburn Bilberry, MD  nitroGLYCERIN (NITROSTAT) 0.4 MG SL tablet Place 1 tablet (0.4 mg total) under the tongue every 5 (five) minutes as needed for chest pain. 06/04/16  Yes Enid Baas, MD  pantoprazole (PROTONIX) 40 MG tablet Take 1 tablet (40 mg total) by mouth daily. 05/24/16 05/24/17 Yes Darren Servando Snare, MD  PARoxetine (PAXIL) 30 MG tablet Take 1 tablet (30 mg  total) by mouth daily. 06/04/16  Yes Enid Baas, MD  potassium chloride (K-DUR) 10 MEQ tablet Take 1 tablet (10 mEq total) by mouth 2 (two) times daily. Patient taking differently: Take 10 mEq by mouth daily.  08/21/16 08/21/17 Yes Antonieta Iba, MD  prazosin (MINIPRESS) 1 MG capsule Take 1 mg by mouth at bedtime.   Yes Historical Provider, MD  QUEtiapine (SEROQUEL) 300 MG tablet Take 1 tablet (300 mg total) by mouth at bedtime. 06/04/16  Yes Enid Baas, MD  spironolactone (ALDACTONE) 25 MG tablet Take 1 tablet (25 mg total) by mouth daily. 08/21/16  Yes Antonieta Iba, MD     Review of Systems  Constitutional: Positive for fatigue. Negative for appetite change.  HENT: Negative for congestion, nosebleeds and sore throat.   Eyes: Negative.   Respiratory: Negative for cough, chest tightness and shortness of breath.   Cardiovascular: Negative for chest pain, palpitations and leg swelling.  Gastrointestinal: Negative for abdominal distention and abdominal pain.  Endocrine: Negative.   Genitourinary: Negative.   Musculoskeletal: Positive for arthralgias (knee pain). Negative for neck pain.  Skin: Negative.   Allergic/Immunologic: Negative.   Neurological: Positive for light-headedness and headaches.  Hematological: Negative for adenopathy. Does not bruise/bleed easily.  Psychiatric/Behavioral: Positive for dysphoric mood and sleep disturbance (sleeping on 2 pillows). Negative for suicidal ideas. The patient is nervous/anxious.    Vitals:   12/04/16 1013  BP: 128/77  Pulse: 83  Resp: 20  SpO2: 100%  Weight: 159 lb 2 oz (72.2 kg)  Height: 5\' 5"  (1.651 m)   Wt Readings from Last 3 Encounters:  12/04/16 159 lb 2 oz (72.2 kg)  11/19/16 166 lb 8 oz (75.5 kg)  11/05/16 162 lb (73.5 kg)   Lab Results  Component Value Date   CREATININE 0.81 11/05/2016   CREATININE 1.34 (H) 09/18/2016   CREATININE 1.20 (H) 09/17/2016    Physical Exam  Constitutional: She is oriented to person, place, and time. She appears well-developed and well-nourished.  HENT:  Head: Normocephalic and atraumatic.  Neck: Normal range of motion. Neck supple. No JVD present.  Cardiovascular: Normal rate and regular rhythm.   Pulmonary/Chest: Effort normal. She has no wheezes. She has no rales.  Abdominal: Soft. She exhibits no distension. There is no tenderness.  Musculoskeletal: She exhibits no edema or tenderness.  Neurological: She is alert and oriented to person, place, and time.  Skin: Skin is warm and dry.  Psychiatric: She has a normal mood and affect. Her behavior is normal.  Thought content normal.  Nursing note and vitals reviewed.   Assessment & Plan:  1: Chronic heart failure with reduced ejection fraction- - NYHA class II - euvolemic - weighing daily. She's to call for an overnight weight gain of >2 pounds or a weekly weight gain of >5 pounds. Down about 3 pounds from last time she was here - not adding salt to her food and is trying to eat low sodium foods. Reading food labels now so that she can keep her sodium intake to 2000mg  daily - becoming more active and is now walking every day - drinking "lots" of fluids daily due to dry mouth. Discussed the importance of maintaining her fluid intake between 40-48 ounces of fluid daily. Can suck on sugar free candy - discussed increasing entresto or cavedilol at her next visit - BMP drawn today - saw cardiologist Mariah Milling) 11/19/16  2: HTN- - BP looks good today - has PCP appointment scheduled for 12/28/16  3:  Obstructive sleep apnea- - continues to have difficulty with wearing her CPAP  4: Depression- - does have a history of PTSD - no suicidal thoughts at this time  Return here in 2 months or sooner for any questions/problems before then.

## 2016-12-06 ENCOUNTER — Ambulatory Visit: Payer: Medicare Other | Admitting: Family

## 2016-12-07 ENCOUNTER — Emergency Department
Admission: EM | Admit: 2016-12-07 | Discharge: 2016-12-07 | Disposition: A | Discharge status: Home / Self Care | Payer: Medicare Other | Source: Home / Self Care | Attending: Emergency Medicine | Admitting: Emergency Medicine

## 2016-12-07 ENCOUNTER — Emergency Department: Payer: Medicare Other

## 2016-12-07 ENCOUNTER — Encounter: Payer: Self-pay | Admitting: Emergency Medicine

## 2016-12-07 DIAGNOSIS — R109 Unspecified abdominal pain: Secondary | ICD-10-CM | POA: Diagnosis not present

## 2016-12-07 DIAGNOSIS — Z79899 Other long term (current) drug therapy: Secondary | ICD-10-CM

## 2016-12-07 DIAGNOSIS — Z87891 Personal history of nicotine dependence: Secondary | ICD-10-CM | POA: Insufficient documentation

## 2016-12-07 DIAGNOSIS — I2510 Atherosclerotic heart disease of native coronary artery without angina pectoris: Secondary | ICD-10-CM | POA: Insufficient documentation

## 2016-12-07 DIAGNOSIS — R0602 Shortness of breath: Secondary | ICD-10-CM | POA: Diagnosis not present

## 2016-12-07 DIAGNOSIS — R1084 Generalized abdominal pain: Secondary | ICD-10-CM | POA: Insufficient documentation

## 2016-12-07 DIAGNOSIS — Z7982 Long term (current) use of aspirin: Secondary | ICD-10-CM | POA: Insufficient documentation

## 2016-12-07 DIAGNOSIS — E119 Type 2 diabetes mellitus without complications: Secondary | ICD-10-CM | POA: Insufficient documentation

## 2016-12-07 DIAGNOSIS — I509 Heart failure, unspecified: Secondary | ICD-10-CM | POA: Diagnosis not present

## 2016-12-07 DIAGNOSIS — R42 Dizziness and giddiness: Secondary | ICD-10-CM

## 2016-12-07 DIAGNOSIS — F1290 Cannabis use, unspecified, uncomplicated: Secondary | ICD-10-CM

## 2016-12-07 DIAGNOSIS — R079 Chest pain, unspecified: Secondary | ICD-10-CM | POA: Diagnosis not present

## 2016-12-07 DIAGNOSIS — J45909 Unspecified asthma, uncomplicated: Secondary | ICD-10-CM | POA: Insufficient documentation

## 2016-12-07 DIAGNOSIS — I5022 Chronic systolic (congestive) heart failure: Secondary | ICD-10-CM

## 2016-12-07 DIAGNOSIS — R531 Weakness: Secondary | ICD-10-CM | POA: Diagnosis not present

## 2016-12-07 DIAGNOSIS — K529 Noninfective gastroenteritis and colitis, unspecified: Secondary | ICD-10-CM

## 2016-12-07 DIAGNOSIS — I110 Hypertensive heart disease with heart failure: Secondary | ICD-10-CM | POA: Insufficient documentation

## 2016-12-07 DIAGNOSIS — G44219 Episodic tension-type headache, not intractable: Secondary | ICD-10-CM | POA: Insufficient documentation

## 2016-12-07 DIAGNOSIS — R1111 Vomiting without nausea: Secondary | ICD-10-CM | POA: Diagnosis not present

## 2016-12-07 DIAGNOSIS — R51 Headache: Secondary | ICD-10-CM | POA: Diagnosis not present

## 2016-12-07 LAB — URINALYSIS, COMPLETE (UACMP) WITH MICROSCOPIC
Bacteria, UA: NONE SEEN
Bilirubin Urine: NEGATIVE
Glucose, UA: 50 mg/dL — AB
Hgb urine dipstick: NEGATIVE
Ketones, ur: NEGATIVE mg/dL
Leukocytes, UA: NEGATIVE
Nitrite: NEGATIVE
Protein, ur: NEGATIVE mg/dL
Specific Gravity, Urine: 1.015 (ref 1.005–1.030)
pH: 5 (ref 5.0–8.0)

## 2016-12-07 LAB — CBC
HEMATOCRIT: 32.9 % — AB (ref 35.0–47.0)
HEMOGLOBIN: 11.2 g/dL — AB (ref 12.0–16.0)
MCH: 29.3 pg (ref 26.0–34.0)
MCHC: 34.2 g/dL (ref 32.0–36.0)
MCV: 85.8 fL (ref 80.0–100.0)
Platelets: 206 10*3/uL (ref 150–440)
RBC: 3.83 MIL/uL (ref 3.80–5.20)
RDW: 19.3 % — ABNORMAL HIGH (ref 11.5–14.5)
WBC: 5.1 10*3/uL (ref 3.6–11.0)

## 2016-12-07 LAB — BASIC METABOLIC PANEL
ANION GAP: 9 (ref 5–15)
BUN: 32 mg/dL — ABNORMAL HIGH (ref 6–20)
CO2: 24 mmol/L (ref 22–32)
Calcium: 9.8 mg/dL (ref 8.9–10.3)
Chloride: 102 mmol/L (ref 101–111)
Creatinine, Ser: 1.14 mg/dL — ABNORMAL HIGH (ref 0.44–1.00)
GFR calc Af Amer: >60 mL/min (ref >60)
GFR, EST NON AFRICAN AMERICAN: 54 mL/min — AB (ref >60)
GLUCOSE: 315 mg/dL — AB (ref 65–99)
Potassium: 4.2 mmol/L (ref 3.5–5.1)
Sodium: 135 mmol/L (ref 135–145)

## 2016-12-07 LAB — TROPONIN I: Troponin I: <0.03 ng/mL (ref <0.03)

## 2016-12-07 MED ORDER — KETOROLAC TROMETHAMINE 30 MG/ML IJ SOLN
15.0000 mg | Freq: Once | INTRAMUSCULAR | Status: AC
  Administered 2016-12-07: 15 mg via INTRAVENOUS
  Filled 2016-12-07: qty 1

## 2016-12-07 MED ORDER — ONDANSETRON 4 MG PO TBDP: 4.0000 mg | ORAL_TABLET | Freq: Three times a day (TID) | ORAL | 0 refills | Status: DC | PRN

## 2016-12-07 MED ORDER — DICYCLOMINE HCL 20 MG PO TABS: 20.0000 mg | ORAL_TABLET | Freq: Three times a day (TID) | ORAL | 0 refills | Status: DC | PRN

## 2016-12-07 MED ORDER — MORPHINE SULFATE (PF) 4 MG/ML IV SOLN
4.0000 mg | Freq: Once | INTRAVENOUS | Status: AC
  Administered 2016-12-07: 4 mg via INTRAVENOUS
  Filled 2016-12-07: qty 1

## 2016-12-07 MED ORDER — SODIUM CHLORIDE 0.9 % IV SOLN
Freq: Once | INTRAVENOUS | Status: AC
  Administered 2016-12-07: 17:00:00 via INTRAVENOUS

## 2016-12-07 MED ORDER — ONDANSETRON HCL 4 MG/2ML IJ SOLN
4.0000 mg | Freq: Once | INTRAMUSCULAR | Status: AC
  Administered 2016-12-07: 4 mg via INTRAVENOUS
  Filled 2016-12-07: qty 2

## 2016-12-07 NOTE — ED Triage Notes (Signed)
Pt in via POV with complaints of intermittent dizziness x four days with chest pain and hypotension.  Pt reports N/V/D as well.  Pt A/Ox4, NAD noted at this time.

## 2016-12-07 NOTE — ED Notes (Signed)
Pt c/o upper mid/RUQ abdominal pain worse than CP while waiting. Lipase added per protocol.

## 2016-12-07 NOTE — ED Provider Notes (Addendum)
[facility] Emergency Department Provider Note       Time seen: ----------------------------------------- 4:14 PM on 12/07/2016 -----------------------------------------     I have reviewed the triage vital signs and the nursing notes.   HISTORY   Chief Complaint Dizziness    HPI Bonnie Ochoa is a 51 y.o. female who presents to the ED for intermittent dizziness for 4 days with chest pain and hypotension. Patient reports nausea, vomiting and diarrhea as well as stomach cramps.  Family reports blood pressure at night was around 60 systolic. She does take 40 mg of Lasix twice daily but does not note any recent changes in her weight. She has not weighed herself today.   Past Medical History:  Diagnosis Date  . Asthma   . Bipolar 1 disorder (HCC)   . CHF (congestive heart failure) (HCC)   . Cirrhosis of liver (HCC)   . Coronary artery disease   . Depression   . Diabetes mellitus without complication (HCC)   . Hypertension   . PTSD (post-traumatic stress disorder)   . Restless leg syndrome   . Sleep apnea     Patient Active Problem List   Diagnosis Date Noted  . Chronic systolic heart failure (HCC) 10/08/2016  . Obstructive sleep apnea 10/08/2016  . Hypertensive heart disease 09/18/2016  . Noncompliance 09/18/2016  . Cirrhosis (HCC) 09/18/2016  . Pulmonary hypertension (HCC) 05/30/2016  . Hypokalemia 05/30/2016  . NICM (nonischemic cardiomyopathy) (HCC) 05/30/2016  . Elevated troponin   . Other ascites   . Hypertension   . Diabetes mellitus without complication (HCC)   . Depression   . Bipolar 1 disorder (HCC)   . Asthma   . Generalized abdominal pain 05/07/2016    Past Surgical History:  Procedure Laterality Date  . ABDOMINAL HYSTERECTOMY    . debribalator  2016  . hysterectmy  2011  . INSERT / REPLACE / REMOVE PACEMAKER    . INSERTION OF ICD    . TONSILECTOMY, ADENOIDECTOMY, BILATERAL MYRINGOTOMY AND TUBES    . TUBAL LIGATION   1980    Allergies Patient has no known allergies.  Social History Social History  Substance Use Topics  . Smoking status: Former Games developer  . Smokeless tobacco: Never Used  . Alcohol use No    Review of Systems Constitutional: Negative for fever. Eyes: Negative for vision changes ENT:  Negative for congestion, sore throat Cardiovascular: Negative for chest pain. Respiratory: Negative for shortness of breath. Gastrointestinal: Positive for abdominal pain, vomiting and diarrhea Genitourinary: Negative for dysuria. Musculoskeletal: Negative for back pain. Skin: Negative for rash. Neurological: Negative for headaches, focal weakness or numbness. Positive for dizziness  All systems negative/normal/unremarkable except as stated in the HPI  ____________________________________________   PHYSICAL EXAM:  VITAL SIGNS: ED Triage Vitals  Enc Vitals Group     BP 12/07/16 1334 (!) 93/58     Pulse Rate 12/07/16 1334 76     Resp 12/07/16 1334 20     Temp 12/07/16 1334 98.2 F (36.8 C)     Temp Source 12/07/16 1334 Oral     SpO2 12/07/16 1334 100 %     Weight 12/07/16 1335 154 lb (69.9 kg)     Height 12/07/16 1335 5\' 5"  (1.651 m)     Head Circumference --      Peak Flow --      Pain Score 12/07/16 1334 8     Pain Loc --      Pain Edu? --  Excl. in GC? --     Constitutional: Alert and oriented. Well appearing and in no distress. Eyes: Conjunctivae are normal. PERRL. Normal extraocular movements. ENT   Head: Normocephalic and atraumatic.   Nose: No congestion/rhinnorhea.   Mouth/Throat: Mucous membranes are moist.   Neck: No stridor. Cardiovascular: Normal rate, regular rhythm. No murmurs, rubs, or gallops. Respiratory: Normal respiratory effort without tachypnea nor retractions. Breath sounds are clear and equal bilaterally. No wheezes/rales/rhonchi. Gastrointestinal: Soft and nontender. Normal bowel sounds Musculoskeletal: Nontender with normal range of  motion in extremities. No lower extremity tenderness nor edema. Neurologic:  Normal speech and language. No gross focal neurologic deficits are appreciated.  Skin:  Skin is warm, dry and intact. No rash noted. Psychiatric: Mood and affect are normal. Speech and behavior are normal.  ____________________________________________  EKG: Interpreted by me.Sinus rhythm. 84 bpm, normal PR interval, normal QT, LVH with repolarization abnormality  ____________________________________________  ED COURSE:  Pertinent labs & imaging results that were available during my care of the patient were reviewed by me and considered in my medical decision making (see chart for details). Patient presents for dizziness as well as abdominal pain, vomiting and diarrhea, we will assess with labs and imaging as indicated. Patient's symptoms are likely related to gastroenteritis and possible dehydration. We'll give gentle IV fluids and antiemetics.   Procedures ____________________________________________   LABS (pertinent positives/negatives)  Labs Reviewed  BASIC METABOLIC PANEL - Abnormal; Notable for the following:       Result Value   Glucose, Bld 315 (*)    BUN 32 (*)    Creatinine, Ser 1.14 (*)    GFR calc non Af Amer 54 (*)    All other components within normal limits  CBC - Abnormal; Notable for the following:    Hemoglobin 11.2 (*)    HCT 32.9 (*)    RDW 19.3 (*)    All other components within normal limits  URINALYSIS, COMPLETE (UACMP) WITH MICROSCOPIC - Abnormal; Notable for the following:    Color, Urine YELLOW (*)    APPearance CLEAR (*)    Glucose, UA 50 (*)    Squamous Epithelial / LPF 0-5 (*)    All other components within normal limits  TROPONIN I  BRAIN NATRIURETIC PEPTIDE  LIPASE, BLOOD  CBG MONITORING, ED   ____________________________________________  FINAL ASSESSMENT AND PLAN  Gastroenteritis, dizziness  Plan: Patient's labs and imaging were dictated above. Patient had  presented for multiple complaints characterized by dizziness as well as nausea, vomiting and diarrhea. We did gently give her IV fluids as well as antiemetics and pain medication. Her labs today are reassuring. She is stable for outpatient follow-up with symptomatic treatment.   Emily Filbert, MD   Note: This note was generated in part or whole with voice recognition software. Voice recognition is usually quite accurate but there are transcription errors that can and very often do occur. I apologize for any typographical errors that were not detected and corrected.     Emily Filbert, MD 12/07/16 8115    Emily Filbert, MD 12/07/16 [phone]

## 2016-12-08 ENCOUNTER — Emergency Department
Admission: EM | Admit: 2016-12-08 | Discharge: 2016-12-08 | Disposition: A | Discharge status: Home / Self Care | Payer: Medicare Other | Attending: Emergency Medicine | Admitting: Emergency Medicine

## 2016-12-08 ENCOUNTER — Emergency Department: Payer: Medicare Other

## 2016-12-08 ENCOUNTER — Encounter: Payer: Self-pay | Admitting: Emergency Medicine

## 2016-12-08 DIAGNOSIS — R42 Dizziness and giddiness: Secondary | ICD-10-CM

## 2016-12-08 DIAGNOSIS — G44219 Episodic tension-type headache, not intractable: Secondary | ICD-10-CM | POA: Diagnosis not present

## 2016-12-08 DIAGNOSIS — R1084 Generalized abdominal pain: Secondary | ICD-10-CM | POA: Diagnosis not present

## 2016-12-08 DIAGNOSIS — R51 Headache: Secondary | ICD-10-CM | POA: Diagnosis not present

## 2016-12-08 DIAGNOSIS — R1111 Vomiting without nausea: Secondary | ICD-10-CM

## 2016-12-08 DIAGNOSIS — R519 Headache, unspecified: Secondary | ICD-10-CM

## 2016-12-08 LAB — CBC WITH DIFFERENTIAL/PLATELET
BASOS ABS: 0 10*3/uL (ref 0–0.1)
BASOS PCT: 1 %
Eosinophils Absolute: 0.2 10*3/uL (ref 0–0.7)
Eosinophils Relative: 3 %
HEMATOCRIT: 30.4 % — AB (ref 35.0–47.0)
HEMOGLOBIN: 10.4 g/dL — AB (ref 12.0–16.0)
LYMPHS PCT: 33 %
Lymphs Abs: 1.7 10*3/uL (ref 1.0–3.6)
MCH: 29.8 pg (ref 26.0–34.0)
MCHC: 34.4 g/dL (ref 32.0–36.0)
MCV: 86.7 fL (ref 80.0–100.0)
Monocytes Absolute: 0.6 10*3/uL (ref 0.2–0.9)
Monocytes Relative: 12 %
NEUTROS ABS: 2.6 10*3/uL (ref 1.4–6.5)
NEUTROS PCT: 51 %
Platelets: 199 10*3/uL (ref 150–440)
RBC: 3.5 MIL/uL — ABNORMAL LOW (ref 3.80–5.20)
RDW: 20.2 % — ABNORMAL HIGH (ref 11.5–14.5)
WBC: 5.2 10*3/uL (ref 3.6–11.0)

## 2016-12-08 LAB — URINALYSIS, COMPLETE (UACMP) WITH MICROSCOPIC
BILIRUBIN URINE: NEGATIVE
Bacteria, UA: NONE SEEN
Glucose, UA: NEGATIVE mg/dL
HGB URINE DIPSTICK: NEGATIVE
KETONES UR: NEGATIVE mg/dL
Leukocytes, UA: NEGATIVE
NITRITE: NEGATIVE
PROTEIN: NEGATIVE mg/dL
RBC / HPF: NONE SEEN RBC/hpf (ref 0–5)
Specific Gravity, Urine: 1.01 (ref 1.005–1.030)
pH: 5 (ref 5.0–8.0)

## 2016-12-08 LAB — COMPREHENSIVE METABOLIC PANEL
ALBUMIN: 3.7 g/dL (ref 3.5–5.0)
ALT: 23 U/L (ref 14–54)
AST: 22 U/L (ref 15–41)
Alkaline Phosphatase: 75 U/L (ref 38–126)
Anion gap: 6 (ref 5–15)
BUN: 30 mg/dL — ABNORMAL HIGH (ref 6–20)
CHLORIDE: 104 mmol/L (ref 101–111)
CO2: 28 mmol/L (ref 22–32)
Calcium: 9.7 mg/dL (ref 8.9–10.3)
Creatinine, Ser: 1.15 mg/dL — ABNORMAL HIGH (ref 0.44–1.00)
GFR calc Af Amer: >60 mL/min (ref >60)
GFR calc non Af Amer: 54 mL/min — ABNORMAL LOW (ref >60)
Glucose, Bld: 223 mg/dL — ABNORMAL HIGH (ref 65–99)
POTASSIUM: 3.9 mmol/L (ref 3.5–5.1)
SODIUM: 138 mmol/L (ref 135–145)
Total Bilirubin: 0.9 mg/dL (ref 0.3–1.2)
Total Protein: 7.7 g/dL (ref 6.5–8.1)

## 2016-12-08 LAB — LACTIC ACID, PLASMA: Lactic Acid, Venous: 1.3 mmol/L (ref 0.5–1.9)

## 2016-12-08 LAB — PREGNANCY, URINE: Preg Test, Ur: NEGATIVE

## 2016-12-08 LAB — TROPONIN I

## 2016-12-08 LAB — LIPASE, BLOOD: LIPASE: 31 U/L (ref 11–51)

## 2016-12-08 MED ORDER — MECLIZINE HCL 25 MG PO TABS
25.0000 mg | ORAL_TABLET | Freq: Once | ORAL | Status: AC
  Administered 2016-12-08: 25 mg via ORAL
  Filled 2016-12-08: qty 1

## 2016-12-08 MED ORDER — IOPAMIDOL (ISOVUE-300) INJECTION 61%
100.0000 mL | Freq: Once | INTRAVENOUS | Status: AC | PRN
  Administered 2016-12-08: 100 mL via INTRAVENOUS

## 2016-12-08 MED ORDER — MORPHINE SULFATE (PF) 4 MG/ML IV SOLN
4.0000 mg | Freq: Once | INTRAVENOUS | Status: AC
  Administered 2016-12-08: 4 mg via INTRAVENOUS
  Filled 2016-12-08: qty 1

## 2016-12-08 MED ORDER — DIAZEPAM 2 MG PO TABS
2.0000 mg | ORAL_TABLET | Freq: Once | ORAL | Status: AC
  Administered 2016-12-08: 2 mg via ORAL
  Filled 2016-12-08: qty 1

## 2016-12-08 MED ORDER — PROCHLORPERAZINE MALEATE 10 MG PO TABS: 10.0000 mg | ORAL_TABLET | Freq: Four times a day (QID) | ORAL | 1 refills | Status: DC | PRN

## 2016-12-08 MED ORDER — ONDANSETRON HCL 4 MG/2ML IJ SOLN
4.0000 mg | Freq: Once | INTRAMUSCULAR | Status: AC
  Administered 2016-12-08: 4 mg via INTRAVENOUS
  Filled 2016-12-08: qty 2

## 2016-12-08 MED ORDER — IOPAMIDOL (ISOVUE-300) INJECTION 61%
30.0000 mL | Freq: Once | INTRAVENOUS | Status: AC | PRN
  Administered 2016-12-08: 30 mL via ORAL

## 2016-12-08 MED ORDER — PROCHLORPERAZINE MALEATE 10 MG PO TABS: 10.0000 mg | ORAL_TABLET | Freq: Four times a day (QID) | ORAL | 0 refills | Status: DC | PRN

## 2016-12-08 NOTE — ED Notes (Signed)
Pt reports feeling better, states valium and meclizine helped with dizziness and headache. Ambulated in the hall unassisted without difficulty.  D/w Dr. Alphonzo Lemmings. Pt will be discharged.

## 2016-12-08 NOTE — Discharge Instructions (Signed)
If you have any new or worrisome symptoms including worsening headache, numbness, weakness, recurrent vertigo, fever, persistent vomiting or you feel worse in any way please return to the emergency department. Follow up closely with neurology and your primary care doctor.

## 2016-12-08 NOTE — ED Notes (Signed)
D/w that we are currently waiting for CT results and the EDP will discuss with her when they are back. Denies any other needs at this time.

## 2016-12-08 NOTE — ED Triage Notes (Signed)
Pt arrived to ED via [facility] with c/o dizziness and headache for the past 4 days. Also reports generalized abd cramping intermittently. Pt states she was seen in this ED for the same earlier today and was treated for dehydration and released home. Pt states she does not know why she was not admitted to the hospital. Pt reports having sensitivity to light and sounds. 2L of fluids provided to pt at earlier visit to ED. Pt currently has clear speech and no distress noted.

## 2016-12-08 NOTE — ED Provider Notes (Signed)
[facility] Emergency Department Provider Note   ____________________________________________   First MD Initiated Contact with Patient 12/08/16 [phone]     (approximate)  I have reviewed the triage vital signs and the nursing notes.   HISTORY  Chief Complaint Abdominal Pain; Headache; and Dizziness   HPI Bonnie Ochoa is a 50 y.o. female with a history of diabetes and hypertension and asthma who complains of headache which is worsening subdural area and dizziness and vertigo for about the last 4 days. Patient also complains of abdominal crampiness and nausea and vomiting and diarrhea. Patient was seen earlier today and got IV fluids and pain medicine and her belly felt better but her vertigo never got better. She comes back in today because his stomach is hurting and vertigo is still bothering her a lot. She says she gets dizzy and spinning if she moves her head from side to side or stands up.   Past Medical History:  Diagnosis Date  . Asthma   . Bipolar 1 disorder (HCC)   . CHF (congestive heart failure) (HCC)   . Cirrhosis of liver (HCC)   . Coronary artery disease   . Depression   . Diabetes mellitus without complication (HCC)   . Hypertension   . PTSD (post-traumatic stress disorder)   . Restless leg syndrome   . Sleep apnea     Patient Active Problem List   Diagnosis Date Noted  . Chronic systolic heart failure (HCC) 10/08/2016  . Obstructive sleep apnea 10/08/2016  . Hypertensive heart disease 09/18/2016  . Noncompliance 09/18/2016  . Cirrhosis (HCC) 09/18/2016  . Pulmonary hypertension (HCC) 05/30/2016  . Hypokalemia 05/30/2016  . NICM (nonischemic cardiomyopathy) (HCC) 05/30/2016  . Elevated troponin   . Other ascites   . Hypertension   . Diabetes mellitus without complication (HCC)   . Depression   . Bipolar 1 disorder (HCC)   . Asthma   . Generalized abdominal pain 05/07/2016    Past Surgical History:  Procedure Laterality  Date  . ABDOMINAL HYSTERECTOMY    . debribalator  2016  . hysterectmy  2011  . INSERT / REPLACE / REMOVE PACEMAKER    . INSERTION OF ICD    . TONSILECTOMY, ADENOIDECTOMY, BILATERAL MYRINGOTOMY AND TUBES    . TONSILLECTOMY    . TUBAL LIGATION  1980  . TUBAL LIGATION      Prior to Admission medications   Medication Sig Start Date End Date Taking? Authorizing Provider  aspirin EC 81 MG tablet Take 1 tablet (81 mg total) by mouth daily. 06/04/16   Enid Baas, MD  atorvastatin (LIPITOR) 40 MG tablet Take 1 tablet (40 mg total) by mouth daily. 08/21/16   Antonieta Iba, MD  carvedilol (COREG) 6.25 MG tablet Take 1 tablet (6.25 mg total) by mouth 2 (two) times daily with a meal. 08/21/16   Antonieta Iba, MD  dicyclomine (BENTYL) 20 MG tablet Take 1 tablet (20 mg total) by mouth 3 (three) times daily as needed for spasms. 12/07/16   Emily Filbert, MD  furosemide (LASIX) 40 MG tablet Take 60 mg by mouth 2 (two) times daily.    Historical Provider, MD  metolazone (ZAROXOLYN) 5 MG tablet Take 1 tablet (5 mg total) by mouth daily as needed. 11/19/16 02/17/17  Antonieta Iba, MD  nitroGLYCERIN (NITROSTAT) 0.4 MG SL tablet Place 1 tablet (0.4 mg total) under the tongue every 5 (five) minutes as needed for chest pain. 06/04/16   Enid Baas, MD  ondansetron (ZOFRAN ODT) 4 MG disintegrating tablet Take 1 tablet (4 mg total) by mouth every 8 (eight) hours as needed for nausea or vomiting. 12/07/16   Emily Filbert, MD  pantoprazole (PROTONIX) 40 MG tablet Take 1 tablet (40 mg total) by mouth daily. 05/24/16 05/24/17  Midge Minium, MD  PARoxetine (PAXIL) 30 MG tablet Take 1 tablet (30 mg total) by mouth daily. 06/04/16   Enid Baas, MD  potassium chloride (K-DUR,KLOR-CON) 10 MEQ tablet Take 10 mEq by mouth daily.    Historical Provider, MD  prazosin (MINIPRESS) 1 MG capsule Take 1 mg by mouth at bedtime.    Historical Provider, MD  QUEtiapine (SEROQUEL) 300 MG tablet Take 1 tablet  (300 mg total) by mouth at bedtime. 06/04/16   Enid Baas, MD  sacubitril-valsartan (ENTRESTO) 24-26 MG Take 1 tablet by mouth 2 (two) times daily. 10/05/16   Delma Freeze, FNP  spironolactone (ALDACTONE) 25 MG tablet Take 1 tablet (25 mg total) by mouth daily. 08/21/16   Antonieta Iba, MD    Allergies Patient has no known allergies.  Family History  Problem Relation Age of Onset  . Hypertension Mother   . Hyperlipidemia Mother   . Heart disease Mother   . Hypertension Sister   . Alzheimer's disease Maternal Grandfather     Social History Social History  Substance Use Topics  . Smoking status: Former Games developer  . Smokeless tobacco: Never Used  . Alcohol use No    Review of Systems  Constitutional: No fever/chills Eyes: No visual changes. ENT: No sore throat. Cardiovascular: Denies chest pain. Respiratory: Denies shortness of breath. Gastrointestinal:See history of present illness Genitourinary: Negative for dysuria. Musculoskeletal: Negative for back pain. Skin: Negative for rash. Neurological: Negative for focal weakness or numbness.   ____________________________________________   PHYSICAL EXAM:  VITAL SIGNS: ED Triage Vitals [12/08/16 0030]  Enc Vitals Group     BP 115/77     Pulse Rate 68     Resp 18     Temp 98.3 F (36.8 C)     Temp Source Oral     SpO2 100 %     Weight 154 lb (69.9 kg)     Height 5\' 5"  (1.651 m)     Head Circumference      Peak Flow      Pain Score 10     Pain Loc      Pain Edu?      Excl. in GC?     Constitutional: Alert and oriented. Well appearing and in no acute distress. Eyes: Conjunctivae are normal. PERRL. EOMI. Head: Atraumatic. Nose: No congestion/rhinnorhea. Mouth/Throat: Mucous membranes are moist.  Oropharynx non-erythematous. Neck: No stridor.  Cardiovascular: Normal rate, regular rhythm. Grossly normal heart sounds.  Good peripheral circulation. Respiratory: Normal respiratory effort.  No retractions.  Lungs CTAB. Gastrointestinal: Soft and diffusely tender to palp and percussion No distention. No abdominal bruits. No CVA tenderness. Musculoskeletal: No lower extremity tenderness nor edema.  No joint effusions. Neurologic:  Normal speech and language. No gross focal neurologic deficits are appreciated. No gait instability. Skin:  Skin is warm, dry and intact. No rash noted. Psychiatric: Mood and affect are normal. Speech and behavior are normal.  ____________________________________________   LABS (all labs ordered are listed, but only abnormal results are displayed)  Labs Reviewed  COMPREHENSIVE METABOLIC PANEL - Abnormal; Notable for the following:       Result Value   Glucose, Bld 223 (*)    BUN 30 (*)  Creatinine, Ser 1.15 (*)    GFR calc non Af Amer 54 (*)    All other components within normal limits  CBC WITH DIFFERENTIAL/PLATELET - Abnormal; Notable for the following:    RBC 3.50 (*)    Hemoglobin 10.4 (*)    HCT 30.4 (*)    RDW 20.2 (*)    All other components within normal limits  URINALYSIS, COMPLETE (UACMP) WITH MICROSCOPIC - Abnormal; Notable for the following:    Color, Urine STRAW (*)    APPearance CLEAR (*)    Squamous Epithelial / LPF 0-5 (*)    All other components within normal limits  LIPASE, BLOOD  TROPONIN I  LACTIC ACID, PLASMA  PREGNANCY, URINE   ____________________________________________  EKG EKG read and interpreted by me shows normal sinus rhythm rate of 66 normal axis flipped T's inferiorly and in V5 and V6 are new from 5 February 18 the present yesterday. Patient had some chest pain and EKG was redone that EKG #2 that is showed normal sinus rhythm at 74 normal axis ST-T changes essentially the same as previously. ____________________________________________  RADIOLOGY   ____________________________________________   PROCEDURES  Procedure(s) performed:   Procedures  Critical Care performed:    ____________________________________________   INITIAL IMPRESSION / ASSESSMENT AND PLAN / ED COURSE  Pertinent labs & imaging results that were available during my care of the patient were reviewed by me and considered in my medical decision making (see chart for details).     Patient signed out to Dr. Liberty Handy      ____________________________________________   FINAL CLINICAL IMPRESSION(S) / ED DIAGNOSES  Final diagnoses:  Bad headache  Vomiting without nausea, intractability of vomiting not specified, unspecified vomiting type  Nonintractable episodic headache, unspecified headache type  Vertigo  Generalized abdominal pain      NEW MEDICATIONS STARTED DURING THIS VISIT:  New Prescriptions   No medications on file     Note:  This document was prepared using Dragon voice recognition software and may include unintentional dictation errors.    Arnaldo Natal, MD 12/08/16 [phone]

## 2016-12-08 NOTE — ED Notes (Signed)
Dr. Alphonzo Lemmings in room speaking to patient about discharge instructions. D/w follow up procedures and to follow up with neurology.  Dr. Alphonzo Lemmings offered patient a lumbar puncture for headache, but pt declined.  States she has a hx of migraines years ago and will follow up with PCP and neuro. Pt ambulated to the lobby without difficulty.  Declined a wheelchair.

## 2016-12-08 NOTE — ED Provider Notes (Addendum)
-----------------------------------------   8:57 AM on 12/08/2016 -----------------------------------------  Patient's seen and evaluated by prior physician. She states she has a history of chronic migraines, she is having a gradual onset headache over the last week which she states is the same as her chronic migraine. She's been seen here twice for this however and appropriately CT scan was ordered which is reassuring. CT of the abdomen was also performed which is reassuring. Patient is neurologically intact she has no headache at this time. According to sign out, patient feels better she is to be discharged. She states she does not feel vertiginous, she does not have a headache, her NIH stroke scale of 0, she is afebrile, she has no elevated white count, she has a negative CT scan, she's been having symptoms for a week. We will see if patient can ambulate, she does well with that and continues to hold down the contrast to drink, is my hope that we can get her safely home. Patient will need outpatient neurology follow-up for her chronic migraines. She states usually her migraines are even worse than this. Her belly is benign.    ----------------------------------------- 9:17 AM on 12/08/2016 -----------------------------------------  Patient has no vertigo embolus with no difficulty, she has a pacemaker, therefore we cannot do an MRI, and I'm not certain that would be indicated anyway given her complete resolution of symptoms and a chronic recurrent nature of them. Patient does need to follow-up with neurology. She is a related apartment she is eating and she is eager to leave. She declines admission and she states she feels 100% better. We will have her closely follow up with neurology and return precautions have been given and understood. I do not think LP is indicated, patient is having symptoms for a week with no signs of infection CT is negative there is no sign of mass and I don't suspect bleed.  Discussed LP with patient, at this point she certainly would prefer not to have one. Risk benefits and alternatives explained.  Jeanmarie Plant, MD 12/08/16 [id number]    Jeanmarie Plant, MD 12/08/16 [phone]

## 2016-12-12 ENCOUNTER — Encounter: Payer: Self-pay | Admitting: Gastroenterology

## 2016-12-12 ENCOUNTER — Ambulatory Visit: Payer: Medicare Other | Admitting: Gastroenterology

## 2016-12-20 ENCOUNTER — Telehealth: Payer: Self-pay

## 2016-12-20 ENCOUNTER — Ambulatory Visit (INDEPENDENT_AMBULATORY_CARE_PROVIDER_SITE_OTHER): Payer: Medicare Other

## 2016-12-20 DIAGNOSIS — I5022 Chronic systolic (congestive) heart failure: Secondary | ICD-10-CM

## 2016-12-20 DIAGNOSIS — Z95810 Presence of automatic (implantable) cardiac defibrillator: Secondary | ICD-10-CM

## 2016-12-20 NOTE — Telephone Encounter (Signed)
Remote ICM transmission received.  Attempted patient call and no message. 

## 2016-12-20 NOTE — Progress Notes (Signed)
EPIC Encounter for ICM Monitoring  Patient Name: Bonnie Ochoa is a 51 y.o. female Date: 12/20/2016 Primary Care Physican: Valerie Roys, DO Primary Cardiologist: Rockey Situ Electrophysiologist: Caryl Comes Dry Weight:   Last office weight 166 lbs      Attempted call to patient and unable to reach. Transmission reviewed.    Thoracic impedance is normal but was abnormal suggesting fluid accumulation from 12/08/2016 to 12/15/2016.  Patient had ER visit on 12/08/2016.  Prescribed dosage: Furosemide 40 mg 1.5 tablets (60 mg total) by mouth 2 (two) times daily.  Potassium 10 mEq 1 tablet (10 mEq total) by mouth 2 (two) times daily.  Labs: 11/05/2016 Creatinine 0.81, BUN 22, Potassium 3.9, Sodium 136, EGFR >60 09/18/2016 Creatinine 1.34, BUN 28, Potassium 3.3, Sodium 138, EGFR 45-52  09/17/2016 Creatinine 1.20, BUN 24, Potassium 3.4, Sodium 138, EGFR 51-59  09/11/2016 Creatinine 1.14, BUN 22, Potassium 3.3, Sodium 138, EGFR 54->60  09/09/2016 Creatinine 1.13, BUN 24, Potassium 3.6, Sodium 136, EGFR 55->60   Recommendations: NONE - Unable to reach patient   Follow-up plan: ICM clinic phone appointment on 01/22/2017.    Copy of ICM check sent to device physician.   3 month ICM trend: 12/20/2016   1 Year ICM trend:      Rosalene Billings, RN 12/20/2016 7:50 AM

## 2016-12-28 ENCOUNTER — Ambulatory Visit (INDEPENDENT_AMBULATORY_CARE_PROVIDER_SITE_OTHER): Payer: Medicare Other | Admitting: Family Medicine

## 2016-12-28 ENCOUNTER — Other Ambulatory Visit: Payer: Self-pay

## 2016-12-28 ENCOUNTER — Encounter: Payer: Self-pay | Admitting: Family Medicine

## 2016-12-28 VITALS — BP 104/71 | HR 66 | Temp 98.3°F | Ht 64.3 in | Wt 165.0 lb

## 2016-12-28 DIAGNOSIS — I272 Pulmonary hypertension, unspecified: Secondary | ICD-10-CM | POA: Diagnosis not present

## 2016-12-28 DIAGNOSIS — F319 Bipolar disorder, unspecified: Secondary | ICD-10-CM

## 2016-12-28 DIAGNOSIS — Z91199 Patient's noncompliance with other medical treatment and regimen due to unspecified reason: Secondary | ICD-10-CM

## 2016-12-28 DIAGNOSIS — F329 Major depressive disorder, single episode, unspecified: Secondary | ICD-10-CM

## 2016-12-28 DIAGNOSIS — Z9119 Patient's noncompliance with other medical treatment and regimen: Secondary | ICD-10-CM

## 2016-12-28 DIAGNOSIS — E876 Hypokalemia: Secondary | ICD-10-CM

## 2016-12-28 DIAGNOSIS — I10 Essential (primary) hypertension: Secondary | ICD-10-CM | POA: Diagnosis not present

## 2016-12-28 DIAGNOSIS — G4733 Obstructive sleep apnea (adult) (pediatric): Secondary | ICD-10-CM

## 2016-12-28 DIAGNOSIS — N390 Urinary tract infection, site not specified: Secondary | ICD-10-CM

## 2016-12-28 DIAGNOSIS — R1084 Generalized abdominal pain: Secondary | ICD-10-CM

## 2016-12-28 DIAGNOSIS — K7031 Alcoholic cirrhosis of liver with ascites: Secondary | ICD-10-CM

## 2016-12-28 DIAGNOSIS — I5022 Chronic systolic (congestive) heart failure: Secondary | ICD-10-CM | POA: Diagnosis not present

## 2016-12-28 DIAGNOSIS — E119 Type 2 diabetes mellitus without complications: Secondary | ICD-10-CM | POA: Diagnosis not present

## 2016-12-28 DIAGNOSIS — R8281 Pyuria: Secondary | ICD-10-CM

## 2016-12-28 LAB — UA/M W/RFLX CULTURE, ROUTINE
Bilirubin, UA: NEGATIVE
Glucose, UA: NEGATIVE
Ketones, UA: NEGATIVE
LEUKOCYTES UA: NEGATIVE
Nitrite, UA: NEGATIVE
PH UA: 5 (ref 5.0–7.5)
PROTEIN UA: NEGATIVE
RBC UA: NEGATIVE
Specific Gravity, UA: 1.01 (ref 1.005–1.030)
Urobilinogen, Ur: 0.2 mg/dL (ref 0.2–1.0)

## 2016-12-28 LAB — MICROSCOPIC EXAMINATION
BACTERIA UA: NONE SEEN
RBC, UA: NONE SEEN /hpf (ref 0–?)

## 2016-12-28 LAB — MICROALBUMIN, URINE WAIVED
CREATININE, URINE WAIVED: 100 mg/dL (ref 10–300)
MICROALB, UR WAIVED: 10 mg/L (ref 0–19)

## 2016-12-28 LAB — BAYER DCA HB A1C WAIVED: HB A1C (BAYER DCA - WAIVED): 7.1 % — ABNORMAL HIGH (ref <7.0)

## 2016-12-28 LAB — HEMOGLOBIN A1C: Hemoglobin A1C: 7.1

## 2016-12-28 MED ORDER — PAROXETINE HCL 30 MG PO TABS: 30.0000 mg | ORAL_TABLET | Freq: Every day | ORAL | 0 refills | Status: DC

## 2016-12-28 MED ORDER — METOLAZONE 5 MG PO TABS: 5.0000 mg | ORAL_TABLET | Freq: Every day | ORAL | 3 refills | Status: DC | PRN

## 2016-12-28 MED ORDER — DICYCLOMINE HCL 20 MG PO TABS: 20.0000 mg | ORAL_TABLET | Freq: Three times a day (TID) | ORAL | 3 refills | Status: DC | PRN

## 2016-12-28 MED ORDER — QUETIAPINE FUMARATE 300 MG PO TABS: 300.0000 mg | ORAL_TABLET | Freq: Every day | ORAL | 0 refills | Status: DC

## 2016-12-28 NOTE — Assessment & Plan Note (Signed)
Now living with her sister who is helping to take care of her.

## 2016-12-28 NOTE — Assessment & Plan Note (Signed)
Euvolemic today despite weight gain. BP under good control. Continue current regimen. Continue to follow with cardiology. Call with any concerns.

## 2016-12-28 NOTE — Progress Notes (Addendum)
BP 104/71 (BP Location: Left Arm, Patient Position: Sitting, Cuff Size: Normal)   Pulse 66   Temp 98.3 F (36.8 C)   Ht 5' 4.3" (1.633 m)   Wt 165 lb (74.8 kg)   SpO2 99%   BMI 28.06 kg/m    Subjective:    Patient ID: Bonnie Ochoa, female    DOB: 17-Nov-1965, 51 y.o.   MRN: [id number]  HPI: Bonnie Ochoa is a 51 y.o. female who presents today to establish care after moving here from [facility] Complaint  Patient presents with  . Establish Care  . Depression    Refill on Paroxetine  . Manic Behavior    Refill on Quetiapine   . Irritable Bowel Syndrome    Refill on dicyclomine  . Referral    Sleep apnea   CHF- follows with Dr. Mariah Milling and the CHF clinic. CHF has been up and down- has been in the ER with exacerbations with significant fluid diuresed off. She has been following with the CHF clinic regularly, to call for weight gain of >2lbs. She has gained 11 since the last time she was seen. She feels like her breathing is good today  Cirrhosis- saw Dr. Servando Snare in October 2017- has history of diverticulitis, ulcer, on protonix with no help ABDOMINAL ISSUES Duration: Chronic Nature: "pain" Location: lower quadrants  Severity: severe  Radiation: yes- into her back Episode duration: 20 minutes or so Frequency: waxes and wanes Alleviating factors: laying down and rubbing it Aggravating factors: positioning Treatments attempted: bentyl and PPI Constipation: no Diarrhea: no Mucous in the stool: no Heartburn: no Bloating:yes Flatulence: no Nausea: yes Vomiting: no Melena or hematochezia: no Rash: no Jaundice: no Fever: no Weight loss: no  HYPERTENSION / HYPERLIPIDEMIA Satisfied with current treatment? yes Duration of hypertension: chronic BP monitoring frequency: not checking BP medication side effects: no Duration of hyperlipidemia: chronic Cholesterol medication side effects: no Cholesterol supplements: none Past cholesterol medications:  atorvastatin Medication compliance: good compliance Aspirin: yes Recent stressors: yes Recurrent headaches: yes Visual changes: yes Palpitations: yes Dyspnea: no Chest pain: yes Lower extremity edema: no Dizzy/lightheaded: yes  DIABETES Hypoglycemic episodes:no Polydipsia/polyuria: yes Visual disturbance: yes Chest pain: yes- chronic Paresthesias: no Glucose Monitoring: no Taking Insulin?: no Blood Pressure Monitoring: not checking Retinal Examination: Not up to Date Foot Exam: Not up to Date Diabetic Education: Not Completed Pneumovax: unknown Influenza: unknown Aspirin: yes  DEPRESSION/BIPOLAR- Has not established with a psychiatrist here- has been out of her medicine since October Mood status: uncontrolled Satisfied with current treatment?: no Symptom severity: moderate  Duration of current treatment : chronic Side effects: no Medication compliance: poor compliance Psychotherapy/counseling: no  Previous psychiatric medications: paxil and seroquel Depressed mood: yes Anxious mood: yes Anhedonia: no Significant weight loss or gain: yes Insomnia: yes hard to fall asleep Fatigue: yes Feelings of worthlessness or guilt: yes Impaired concentration/indecisiveness: yes Suicidal ideations: yes- passive only Hopelessness: yes Crying spells: yes Depression screen [facility] 2/9 12/28/2016 12/04/2016 11/05/2016 10/05/2016  Decreased Interest 3 0 0 0  Down, Depressed, Hopeless 3 1 0 0  PHQ - 2 Score 6 1 0 0  Altered sleeping 3 - - -  Tired, decreased energy 3 - - -  Change in appetite 3 - - -  Feeling bad or failure about yourself  3 - - -  Trouble concentrating 3 - - -  Moving slowly or fidgety/restless 3 - - -  Suicidal thoughts 1 - - -  PHQ-9 Score 25 - - -  GAD 7 : Generalized Anxiety Score 12/28/2016  Nervous, Anxious, on Edge 3  Control/stop worrying 3  Worry too much - different things 3  Trouble relaxing 3  Restless 3  Easily annoyed or irritable 3  Afraid -  awful might happen 3  Total GAD 7 Score 21  Anxiety Difficulty Extremely difficult      Active Ambulatory Problems    Diagnosis Date Noted  . Generalized abdominal pain 05/07/2016  . Hypertension   . Diabetes mellitus without complication (HCC)   . Depression   . Bipolar 1 disorder (HCC)   . Asthma   . Pulmonary hypertension (HCC) 05/30/2016  . Hypokalemia 05/30/2016  . NICM (nonischemic cardiomyopathy) (HCC) 05/30/2016  . Other ascites   . Hypertensive heart disease 09/18/2016  . Noncompliance 09/18/2016  . Cirrhosis (HCC) 09/18/2016  . Chronic systolic heart failure (HCC) 10/08/2016  . Obstructive sleep apnea 10/08/2016   Resolved Ambulatory Problems    Diagnosis Date Noted  . Acute on chronic combined systolic and diastolic CHF (congestive heart failure) (HCC) 05/30/2016  . Elevated troponin   . Acute CHF (congestive heart failure) (HCC) 09/10/2016   Past Medical History:  Diagnosis Date  . ADHD   . Arthritis   . Asthma   . Bipolar 1 disorder (HCC)   . CHF (congestive heart failure) (HCC)   . Cirrhosis of liver (HCC)   . Coronary artery disease   . Depression   . Diabetes mellitus without complication (HCC)   . Diverticulitis   . Hypertension   . IBS (irritable bowel syndrome)   . Insomnia   . Migraines   . PTSD (post-traumatic stress disorder)   . Restless leg syndrome   . Sleep apnea   . Vertigo    Past Surgical History:  Procedure Laterality Date  . ABDOMINAL HYSTERECTOMY    . debribalator  2016  . INSERT / REPLACE / REMOVE PACEMAKER    . INSERTION OF ICD    . TONSILECTOMY, ADENOIDECTOMY, BILATERAL MYRINGOTOMY AND TUBES    . TONSILLECTOMY    . TONSILLECTOMY    . TUBAL LIGATION  1980  . TUBAL LIGATION     Outpatient Encounter Prescriptions as of 12/28/2016  Medication Sig  . aspirin EC 81 MG tablet Take 1 tablet (81 mg total) by mouth daily.  Marland Kitchen atorvastatin (LIPITOR) 40 MG tablet Take 1 tablet (40 mg total) by mouth daily.  . carvedilol (COREG)  6.25 MG tablet Take 1 tablet (6.25 mg total) by mouth 2 (two) times daily with a meal.  . dicyclomine (BENTYL) 20 MG tablet Take 1 tablet (20 mg total) by mouth 3 (three) times daily as needed for spasms.  . furosemide (LASIX) 40 MG tablet Take 60 mg by mouth 2 (two) times daily.  Marland Kitchen LORazepam (ATIVAN) 0.5 MG tablet Take 0.5 mg by mouth every 8 (eight) hours as needed for anxiety.  . metolazone (ZAROXOLYN) 5 MG tablet Take 1 tablet (5 mg total) by mouth daily as needed.  . nitroGLYCERIN (NITROSTAT) 0.4 MG SL tablet Place 1 tablet (0.4 mg total) under the tongue every 5 (five) minutes as needed for chest pain.  Marland Kitchen ondansetron (ZOFRAN ODT) 4 MG disintegrating tablet Take 1 tablet (4 mg total) by mouth every 8 (eight) hours as needed for nausea or vomiting.  . pantoprazole (PROTONIX) 40 MG tablet Take 1 tablet (40 mg total) by mouth daily.  Marland Kitchen PARoxetine (PAXIL) 30 MG tablet Take 1 tablet (30 mg total) by mouth daily.  . potassium  chloride (K-DUR,KLOR-CON) 10 MEQ tablet Take 20 mEq by mouth daily.   . QUEtiapine (SEROQUEL) 300 MG tablet Take 1 tablet (300 mg total) by mouth at bedtime.  . sacubitril-valsartan (ENTRESTO) 24-26 MG Take 1 tablet by mouth 2 (two) times daily.  Marland Kitchen spironolactone (ALDACTONE) 25 MG tablet Take 1 tablet (25 mg total) by mouth daily.  . [DISCONTINUED] dicyclomine (BENTYL) 20 MG tablet Take 1 tablet (20 mg total) by mouth 3 (three) times daily as needed for spasms.  . [DISCONTINUED] PARoxetine (PAXIL) 30 MG tablet Take 1 tablet (30 mg total) by mouth daily.  . [DISCONTINUED] QUEtiapine (SEROQUEL) 300 MG tablet Take 1 tablet (300 mg total) by mouth at bedtime.  . prazosin (MINIPRESS) 1 MG capsule Take 1 mg by mouth at bedtime.  . [DISCONTINUED] prochlorperazine (COMPAZINE) 10 MG tablet Take 1 tablet (10 mg total) by mouth every 6 (six) hours as needed for nausea.   No facility-administered encounter medications on file as of 12/28/2016.    No Known Allergies Social History    Social History  . Marital status: Single    Spouse name: N/A  . Number of children: N/A  . Years of education: N/A   Occupational History  . Not on file.   Social History Main Topics  . Smoking status: Former Games developer  . Smokeless tobacco: Never Used  . Alcohol use No  . Drug use: Yes    Types: Marijuana     Comment: last smoked marijuana earlier today  . Sexual activity: No   Other Topics Concern  . Not on file   Social History Narrative  . No narrative on file   Family History  Problem Relation Age of Onset  . Hypertension Mother   . Hyperlipidemia Mother   . Heart disease Mother   . Hypertension Sister   . Asthma Sister   . Heart disease Sister   . Diabetes Sister   . Cancer Sister   . Alzheimer's disease Maternal Grandfather   . Hyperlipidemia Brother   . Asthma Sister   . Hypertension Sister   . Diabetes Sister     Review of Systems  Constitutional: Negative.   Respiratory: Negative.   Cardiovascular: Negative.   Gastrointestinal: Positive for abdominal distention, abdominal pain and vomiting. Negative for anal bleeding, blood in stool, constipation, diarrhea, nausea and rectal pain.  Musculoskeletal: Positive for arthralgias and myalgias. Negative for back pain, gait problem, joint swelling, neck pain and neck stiffness.  Neurological: Positive for light-headedness and headaches. Negative for dizziness, tremors, seizures, syncope, facial asymmetry, speech difficulty, weakness and numbness.  Hematological: Negative.   Psychiatric/Behavioral: Positive for behavioral problems, confusion, decreased concentration, dysphoric mood and sleep disturbance. Negative for agitation, hallucinations, self-injury and suicidal ideas. The patient is nervous/anxious. The patient is not hyperactive.     Per HPI unless specifically indicated above     Objective:    BP 104/71 (BP Location: Left Arm, Patient Position: Sitting, Cuff Size: Normal)   Pulse 66   Temp 98.3 F  (36.8 C)   Ht 5' 4.3" (1.633 m)   Wt 165 lb (74.8 kg)   SpO2 99%   BMI 28.06 kg/m   Wt Readings from Last 3 Encounters:  12/28/16 165 lb (74.8 kg)  12/08/16 154 lb (69.9 kg)  12/07/16 154 lb (69.9 kg)    Physical Exam  Constitutional: She is oriented to person, place, and time. She appears well-developed and well-nourished. No distress.  HENT:  Head: Normocephalic and atraumatic.  Right Ear:  Hearing and external ear normal.  Left Ear: Hearing and external ear normal.  Nose: Nose normal.  Mouth/Throat: Oropharynx is clear and moist. No oropharyngeal exudate.  Eyes: Conjunctivae, EOM and lids are normal. Pupils are equal, round, and reactive to light. Right eye exhibits no discharge. Left eye exhibits no discharge. No scleral icterus.  Neck: Normal range of motion. Neck supple. No JVD present. No tracheal deviation present. No thyromegaly present.  Cardiovascular: Normal rate, regular rhythm, normal heart sounds and intact distal pulses.  Exam reveals no gallop and no friction rub.   No murmur heard. Pulmonary/Chest: Effort normal and breath sounds normal. No stridor. No respiratory distress. She has no wheezes. She has no rales. She exhibits no tenderness.  Abdominal: Soft. She exhibits distension. She exhibits no mass. There is tenderness. There is no rebound and no guarding.  Musculoskeletal: Normal range of motion. She exhibits no edema.  Lymphadenopathy:    She has no cervical adenopathy.  Neurological: She is alert and oriented to person, place, and time.  Skin: Skin is warm, dry and intact. No rash noted. She is not diaphoretic. No erythema. No pallor.  Psychiatric: She has a normal mood and affect. Her speech is normal and behavior is normal. Judgment and thought content normal. Cognition and memory are normal.  Nursing note and vitals reviewed.   Results for orders placed or performed in visit on 12/28/16  Microscopic Examination  Result Value Ref Range   WBC, UA 0-5 0 -  5 /hpf   RBC, UA None seen 0 - 2 /hpf   Epithelial Cells (non renal) 0-10 0 - 10 /hpf   Bacteria, UA None seen None seen/Few  Bayer DCA Hb A1c Waived  Result Value Ref Range   Bayer DCA Hb A1c Waived 7.1 (H) <7.0 %  Microalbumin, Urine Waived  Result Value Ref Range   Microalb, Ur Waived 10 0 - 19 mg/L   Creatinine, Urine Waived 100 10 - 300 mg/dL   Microalb/Creat Ratio <30 <30 mg/g  UA/M w/rflx Culture, Routine  Result Value Ref Range   Specific Gravity, UA 1.010 1.005 - 1.030   pH, UA 5.0 5.0 - 7.5   Color, UA Yellow Yellow   Appearance Ur Clear Clear   Leukocytes, UA Negative Negative   Protein, UA Negative Negative/Trace   Glucose, UA Negative Negative   Ketones, UA Negative Negative   RBC, UA Negative Negative   Bilirubin, UA Negative Negative   Urobilinogen, Ur 0.2 0.2 - 1.0 mg/dL   Nitrite, UA Negative Negative   Microscopic Examination See below:   Hemoglobin A1c  Result Value Ref Range   Hemoglobin A1C 7.1       Assessment & Plan:   Problem List Items Addressed This Visit      Cardiovascular and Mediastinum   Chronic systolic heart failure (HCC) (Chronic)    Euvolemic today despite weight gain. BP under good control. Continue current regimen. Continue to follow with cardiology. Call with any concerns.       Relevant Orders   CBC with Differential/Platelet   Comprehensive metabolic panel   UA/M w/rflx Culture, Routine (Completed)   Hypertension    BP under good control. Continue current regimen. Continue to follow with cardiology. Call with any concerns.       Relevant Orders   CBC with Differential/Platelet   Comprehensive metabolic panel   Microalbumin, Urine Waived (Completed)   UA/M w/rflx Culture, Routine (Completed)   Pulmonary hypertension (HCC)    No  SOB today. Continue current regimen. Continue to monitor. Call with any concerns.         Respiratory   Obstructive sleep apnea (Chronic)    Not doing well with her CPAP. Would like to see  sleep medicine- referral sent today. Await results.       Relevant Orders   CBC with Differential/Platelet   Comprehensive metabolic panel   UA/M w/rflx Culture, Routine (Completed)   Ambulatory referral to Sleep Studies     Digestive   Cirrhosis (HCC) - Primary    Checking labs today. Await results. Will get her back into see Dr. Servando Snare. Avoid any alcohol. Call with any concerns.       Relevant Orders   CBC with Differential/Platelet   Comprehensive metabolic panel   UA/M w/rflx Culture, Routine (Completed)     Endocrine   Diabetes mellitus without complication (HCC)    A1c at 7.1 today. Will work on diet and exercise and recheck in 3 months. Call with any concerns. Was on metformin previously.      Relevant Orders   CBC with Differential/Platelet   Bayer DCA Hb A1c Waived (Completed)   Comprehensive metabolic panel   Lipid Panel w/o Chol/HDL Ratio   Microalbumin, Urine Waived (Completed)   TSH   UA/M w/rflx Culture, Routine (Completed)     Other   Generalized abdominal pain    Not under good control. Did well with bentyl previously. Will refill. Will get her back into see Dr. Servando Snare- appointment set up today. Await appointment.       Depression    Will refer to psychiatry. Will refill previous medicines right now. Await appointment.       Relevant Medications   LORazepam (ATIVAN) 0.5 MG tablet   PARoxetine (PAXIL) 30 MG tablet   Other Relevant Orders   CBC with Differential/Platelet   Comprehensive metabolic panel   TSH   UA/M w/rflx Culture, Routine (Completed)   Ambulatory referral to Psychiatry   Bipolar 1 disorder [facility])    Will refer to psychiatry. Will refill previous medicines right now. Await appointment.       Relevant Orders   Ambulatory referral to Psychiatry   Hypokalemia    Rechecking levels today. Await results.       Noncompliance    Now living with her sister who is helping to take care of her.           Follow up plan: Return in about  4 weeks (around 01/25/2017) for Follow up, records release please.

## 2016-12-28 NOTE — Assessment & Plan Note (Addendum)
BP under good control. Continue current regimen. Continue to follow with cardiology. Call with any concerns.

## 2016-12-28 NOTE — Assessment & Plan Note (Signed)
Checking labs today. Await results. Will get her back into see Dr. Servando Snare. Avoid any alcohol. Call with any concerns.

## 2016-12-28 NOTE — Assessment & Plan Note (Signed)
Rechecking levels today. Await results.  

## 2016-12-28 NOTE — Assessment & Plan Note (Signed)
Will refer to psychiatry. Will refill previous medicines right now. Await appointment.  

## 2016-12-28 NOTE — Assessment & Plan Note (Signed)
Not doing well with her CPAP. Would like to see sleep medicine- referral sent today. Await results.

## 2016-12-28 NOTE — Assessment & Plan Note (Signed)
BP under good control. Continue current regimen. Continue to follow with cardiology. Call with any concerns.  

## 2016-12-28 NOTE — Assessment & Plan Note (Signed)
A1c at 7.1 today. Will work on diet and exercise and recheck in 3 months. Call with any concerns. Was on metformin previously.

## 2016-12-28 NOTE — Assessment & Plan Note (Signed)
Will refer to psychiatry. Will refill previous medicines right now. Await appointment.

## 2016-12-28 NOTE — Patient Instructions (Addendum)
Stomach doctor- Dr. Servando Snare June 21, 2PM,  [address] [place], [place] [address]  Main: [phone]

## 2016-12-28 NOTE — Assessment & Plan Note (Signed)
No SOB today. Continue current regimen. Continue to monitor. Call with any concerns.

## 2016-12-28 NOTE — Assessment & Plan Note (Signed)
Not under good control. Did well with bentyl previously. Will refill. Will get her back into see Dr. Servando Snare- appointment set up today. Await appointment.

## 2016-12-29 LAB — CBC WITH DIFFERENTIAL/PLATELET
BASOS ABS: 0 10*3/uL (ref 0.0–0.2)
Basos: 1 %
EOS (ABSOLUTE): 0.3 10*3/uL (ref 0.0–0.4)
Eos: 5 %
Hematocrit: 34.1 % (ref 34.0–46.6)
Hemoglobin: 11.2 g/dL (ref 11.1–15.9)
Immature Grans (Abs): 0 10*3/uL (ref 0.0–0.1)
Immature Granulocytes: 0 %
LYMPHS ABS: 2 10*3/uL (ref 0.7–3.1)
Lymphs: 42 %
MCH: 29.2 pg (ref 26.6–33.0)
MCHC: 32.8 g/dL (ref 31.5–35.7)
MCV: 89 fL (ref 79–97)
MONOS ABS: 0.4 10*3/uL (ref 0.1–0.9)
Monocytes: 9 %
NEUTROS ABS: 2 10*3/uL (ref 1.4–7.0)
Neutrophils: 43 %
Platelets: 238 10*3/uL (ref 150–379)
RBC: 3.83 x10E6/uL (ref 3.77–5.28)
RDW: 17.6 % — ABNORMAL HIGH (ref 12.3–15.4)
WBC: 4.7 10*3/uL (ref 3.4–10.8)

## 2016-12-29 LAB — COMPREHENSIVE METABOLIC PANEL
ALBUMIN: 4.1 g/dL (ref 3.5–5.5)
ALK PHOS: 87 IU/L (ref 39–117)
ALT: 22 IU/L (ref 0–32)
AST: 16 IU/L (ref 0–40)
Albumin/Globulin Ratio: 1.1 — ABNORMAL LOW (ref 1.2–2.2)
BUN/Creatinine Ratio: 27 — ABNORMAL HIGH (ref 9–23)
BUN: 34 mg/dL — AB (ref 6–24)
Bilirubin Total: 0.6 mg/dL (ref 0.0–1.2)
CALCIUM: 9.9 mg/dL (ref 8.7–10.2)
CO2: 22 mmol/L (ref 18–29)
CREATININE: 1.28 mg/dL — AB (ref 0.57–1.00)
Chloride: 100 mmol/L (ref 96–106)
GFR calc non Af Amer: 48 mL/min/{1.73_m2} — ABNORMAL LOW (ref >59)
GFR, EST AFRICAN AMERICAN: 55 mL/min/{1.73_m2} — AB (ref >59)
Globulin, Total: 3.9 g/dL (ref 1.5–4.5)
Glucose: 184 mg/dL — ABNORMAL HIGH (ref 65–99)
Potassium: 5.2 mmol/L (ref 3.5–5.2)
Sodium: 139 mmol/L (ref 134–144)
TOTAL PROTEIN: 8 g/dL (ref 6.0–8.5)

## 2016-12-29 LAB — LIPID PANEL W/O CHOL/HDL RATIO
CHOLESTEROL TOTAL: 179 mg/dL (ref 100–199)
HDL: 50 mg/dL (ref >39)
LDL CALC: 103 mg/dL — AB (ref 0–99)
Triglycerides: 132 mg/dL (ref 0–149)
VLDL CHOLESTEROL CAL: 26 mg/dL (ref 5–40)

## 2016-12-29 LAB — TSH: TSH: <0.006 u[IU]/mL — ABNORMAL LOW (ref 0.450–4.500)

## 2016-12-31 ENCOUNTER — Other Ambulatory Visit: Payer: Self-pay | Admitting: *Deleted

## 2016-12-31 ENCOUNTER — Telehealth: Payer: Self-pay | Admitting: Family Medicine

## 2016-12-31 DIAGNOSIS — R7989 Other specified abnormal findings of blood chemistry: Secondary | ICD-10-CM

## 2016-12-31 DIAGNOSIS — N289 Disorder of kidney and ureter, unspecified: Secondary | ICD-10-CM

## 2016-12-31 MED ORDER — METOLAZONE 5 MG PO TABS: 5.0000 mg | ORAL_TABLET | Freq: Every day | ORAL | 3 refills | Status: DC | PRN

## 2016-12-31 NOTE — Telephone Encounter (Signed)
Called to let patient know her results. Her thyroid is very over active- which may be causing some problems with her heart and her mood. I'd like her to see the thyroid doctor, so I've put a referral in for her to see them. I'm going to try to get her 2 tests done on her thyroid before she goes to see them to make it easier for them when she sees them. Her kidneys are a little worse than they had been. I'd like her to really make sure she's drinking her water so that her lasix doesn't dry her out too much and we'll recheck it next time. Everything else looks good. LMOM for her to call back OK to leave her this message if she calls.

## 2017-01-02 ENCOUNTER — Telehealth: Payer: Self-pay

## 2017-01-02 NOTE — Telephone Encounter (Signed)
LVM for patient to return my call.   Scheduled patient's ultrasound.  01/10/2017 at 2:00 PM (arrive at 1:45) at [facility]. [facility].

## 2017-01-04 NOTE — Telephone Encounter (Signed)
Patient notified

## 2017-01-10 ENCOUNTER — Ambulatory Visit
Admission: RE | Admit: 2017-01-10 | Discharge: 2017-01-10 | Disposition: A | Discharge status: Home / Self Care | Payer: Medicare Other | Source: Ambulatory Visit | Attending: Family Medicine | Admitting: Family Medicine

## 2017-01-10 DIAGNOSIS — E042 Nontoxic multinodular goiter: Secondary | ICD-10-CM | POA: Diagnosis not present

## 2017-01-10 DIAGNOSIS — R7989 Other specified abnormal findings of blood chemistry: Secondary | ICD-10-CM

## 2017-01-10 DIAGNOSIS — R946 Abnormal results of thyroid function studies: Secondary | ICD-10-CM | POA: Diagnosis not present

## 2017-01-12 ENCOUNTER — Other Ambulatory Visit: Payer: Self-pay | Admitting: Gastroenterology

## 2017-01-14 NOTE — Telephone Encounter (Signed)
Patient will need office visit prior to next refill. 

## 2017-01-15 ENCOUNTER — Telehealth: Payer: Self-pay | Admitting: Family Medicine

## 2017-01-15 NOTE — Telephone Encounter (Signed)
[place] report shows nodules that need to be biopsied. Do we know if/when she is scheduled with endocrine? I will call her when I find out. Thanks!

## 2017-01-16 NOTE — Telephone Encounter (Signed)
Called and spoke to patient about her results. She will call [facility] and make appointment as she had received the letter.   She notes that she heard from psychiatry that they don't take her insurance- can we please see if we can get her in somewhere else? Thanks! ([phone] number is better to get her)

## 2017-01-16 NOTE — Telephone Encounter (Signed)
They have left 2 messages for patient to call and schedule an appointment and sent a letter June 4th.

## 2017-01-16 NOTE — Telephone Encounter (Signed)
Yes, I sent her referral to [place] [facility] yesterday morning!

## 2017-01-16 NOTE — Telephone Encounter (Signed)
Perfect

## 2017-01-22 ENCOUNTER — Telehealth: Payer: Self-pay

## 2017-01-22 ENCOUNTER — Ambulatory Visit (INDEPENDENT_AMBULATORY_CARE_PROVIDER_SITE_OTHER): Payer: Medicare Other

## 2017-01-22 ENCOUNTER — Telehealth: Payer: Self-pay | Admitting: Cardiology

## 2017-01-22 DIAGNOSIS — I5022 Chronic systolic (congestive) heart failure: Secondary | ICD-10-CM | POA: Diagnosis not present

## 2017-01-22 DIAGNOSIS — Z95810 Presence of automatic (implantable) cardiac defibrillator: Secondary | ICD-10-CM | POA: Diagnosis not present

## 2017-01-22 NOTE — Progress Notes (Signed)
EPIC Encounter for ICM Monitoring  Patient Name: Bonnie Ochoa is a 51 y.o. female Date: 01/22/2017 Primary Care Physican: Valerie Roys, DO Primary Cardiologist: Rockey Situ Electrophysiologist: [place] office weight 166 lbs        Attempted call to patient and unable to reach.  Left message to return call.  Transmission reviewed.    Thoracic impedance normal.  Prescribed dosage: Furosemide 40 mg 1.5 tablets (60 mg total) by mouth 2 (two) times daily. Potassium 10 mEq 2 tablet (20 mEq total) daily.  Metolazone 5 mg 1 tablet as needed.   Labs: 12/28/2016 Creatinine 1.28, BUN 34, Potassium 5.2, Sodium 139, EGFR 48-55 12/08/2016 Creatinine 1.15, BUN 30, Potassium 3.9, Sodium 138, EGFR 54->60 12/07/2016 Creatinine 1.14, BUN 32, Potassium 4.2, Sodium 135, EGFR 52->60 12/04/2016 Creatinine 1.18, BUN 30, Potassium 3.9, Sodium 137, EGFR 54->60 11/05/2016 Creatinine 0.81, BUN 22, Potassium 3.9, Sodium 136, EGFR >60 09/18/2016 Creatinine 1.34, BUN 28, Potassium 3.3, Sodium 138, EGFR 45-52  09/17/2016 Creatinine 1.20, BUN 24, Potassium 3.4, Sodium 138, EGFR 51-59  09/11/2016 Creatinine 1.14, BUN 22, Potassium 3.3, Sodium 138, EGFR 54->60  01/28/2018Creatinine 1.13, BUN 24, Potassium 3.6, Sodium 136, EGFR 55->60   Recommendations:NONE - Unable to reach patient   Follow-up plan: ICM clinic phone appointment on 02/25/2017.  Office appointment scheduled on 02/06/2017 with [name], NP.  Copy of ICM check sent to device physician.   3 month ICM trend: 01/22/2017   1 Year ICM trend:      Rosalene Billings, RN 01/22/2017 3:30 PM

## 2017-01-22 NOTE — Telephone Encounter (Signed)
Remote ICM transmission received.  Attempted patient call and left message to return call.   

## 2017-01-22 NOTE — Telephone Encounter (Signed)
Spoke with pt and reminded pt of remote transmission that is due today. Pt verbalized understanding.   

## 2017-01-29 ENCOUNTER — Ambulatory Visit (INDEPENDENT_AMBULATORY_CARE_PROVIDER_SITE_OTHER): Payer: Medicare Other | Admitting: Family Medicine

## 2017-01-29 ENCOUNTER — Encounter: Payer: Self-pay | Admitting: Family Medicine

## 2017-01-29 VITALS — BP 100/67 | HR 77 | Temp 98.4°F | Wt 181.7 lb

## 2017-01-29 DIAGNOSIS — F319 Bipolar disorder, unspecified: Secondary | ICD-10-CM

## 2017-01-29 DIAGNOSIS — R040 Epistaxis: Secondary | ICD-10-CM

## 2017-01-29 DIAGNOSIS — E119 Type 2 diabetes mellitus without complications: Secondary | ICD-10-CM

## 2017-01-29 DIAGNOSIS — I5022 Chronic systolic (congestive) heart failure: Secondary | ICD-10-CM

## 2017-01-29 DIAGNOSIS — M2550 Pain in unspecified joint: Secondary | ICD-10-CM

## 2017-01-29 DIAGNOSIS — F329 Major depressive disorder, single episode, unspecified: Secondary | ICD-10-CM

## 2017-01-29 DIAGNOSIS — E042 Nontoxic multinodular goiter: Secondary | ICD-10-CM | POA: Insufficient documentation

## 2017-01-29 DIAGNOSIS — K7031 Alcoholic cirrhosis of liver with ascites: Secondary | ICD-10-CM | POA: Diagnosis not present

## 2017-01-29 NOTE — Assessment & Plan Note (Signed)
Stable. Will get her into psychiatry. Note sent regarding this referral sent to referrals coordinator today.

## 2017-01-29 NOTE — Assessment & Plan Note (Signed)
Seeing Dr. Servando Snare later this week. Call with any concerns.

## 2017-01-29 NOTE — Assessment & Plan Note (Signed)
Due for recheck in 2 months.

## 2017-01-29 NOTE — Assessment & Plan Note (Signed)
Euvolemic today. Discussed weight gain. Start exercise again.

## 2017-01-29 NOTE — Progress Notes (Signed)
BP 100/67 (BP Location: Left Arm, Patient Position: Sitting, Cuff Size: Normal)   Pulse 77   Temp 98.4 F (36.9 C)   Wt 181 lb 11.2 oz (82.4 kg)   SpO2 97%   BMI 30.90 kg/m    Subjective:    Patient ID: Bonnie Ochoa, female    DOB: 02-20-1966, 51 y.o.   MRN: [id number]  HPI: Bonnie Ochoa is a 51 y.o. female  Chief Complaint  Patient presents with  . Pain    back, thumb, knee, elbow and head  . Nose Bleeds  . Other    Patient states that she will be burning out and sweat and freezing and sweat.    Feeling good getting back on her medicine. She feels like her mood is stable. She has not heard from the psychiatrist. Review of referrals shows that they do not take her insurance. We will refer her elsewhere. She is feeling pretty stable right now.   Has been having pain throughout her body, especially in her joints. She has been having pain in her back, thumb, knee, elbow and head. Has been taking ibuprofen for it or nothing. No stretching. No heat. No ice.   Has been having problems with nose bleeds- happened 1x yesterday. Stopped in less than 5 minutes. No clots.   She has been having hot flashes and cold sweats. Seeing endocrine in July. She is otherwise feeling well with no other concerns or complaints at this time.   Relevant past medical, surgical, family and social history reviewed and updated as indicated. Interim medical history since our last visit reviewed. Allergies and medications reviewed and updated.  Review of Systems  Constitutional: Negative.   Respiratory: Negative.   Cardiovascular: Negative.   Musculoskeletal: Positive for arthralgias, back pain and myalgias. Negative for gait problem, joint swelling, neck pain and neck stiffness.  Neurological: Negative.   Psychiatric/Behavioral: Negative.     Per HPI unless specifically indicated above     Objective:    BP 100/67 (BP Location: Left Arm, Patient Position: Sitting, Cuff Size: Normal)   Pulse 77    Temp 98.4 F (36.9 C)   Wt 181 lb 11.2 oz (82.4 kg)   SpO2 97%   BMI 30.90 kg/m   Wt Readings from Last 3 Encounters:  01/29/17 181 lb 11.2 oz (82.4 kg)  12/28/16 165 lb (74.8 kg)  12/08/16 154 lb (69.9 kg)    Physical Exam  Constitutional: She is oriented to person, place, and time. She appears well-developed and well-nourished. No distress.  HENT:  Head: Normocephalic and atraumatic.  Right Ear: Hearing and external ear normal.  Left Ear: Hearing and external ear normal.  Nose: Nose normal.  Mouth/Throat: Oropharynx is clear and moist. No oropharyngeal exudate.  Eyes: Conjunctivae, EOM and lids are normal. Pupils are equal, round, and reactive to light. Right eye exhibits no discharge. Left eye exhibits no discharge. No scleral icterus.  Neck: Normal range of motion. Neck supple. No JVD present. No tracheal deviation present. No thyromegaly present.  Cardiovascular: Normal rate, regular rhythm, normal heart sounds and intact distal pulses.  Exam reveals no gallop and no friction rub.   No murmur heard. Pulmonary/Chest: Effort normal and breath sounds normal. No stridor. No respiratory distress. She has no wheezes. She has no rales. She exhibits no tenderness.  Musculoskeletal: Normal range of motion.  Lymphadenopathy:    She has no cervical adenopathy.  Neurological: She is alert and oriented to person, place, and time.  Skin:  Skin is warm, dry and intact. No rash noted. She is not diaphoretic. No erythema. No pallor.  Psychiatric: She has a normal mood and affect. Her speech is normal and behavior is normal. Judgment and thought content normal. Cognition and memory are normal.  Nursing note and vitals reviewed.   Results for orders placed or performed in visit on 12/28/16  Microscopic Examination  Result Value Ref Range   WBC, UA 0-5 0 - 5 /hpf   RBC, UA None seen 0 - 2 /hpf   Epithelial Cells (non renal) 0-10 0 - 10 /hpf   Bacteria, UA None seen None seen/Few  CBC with  Differential/Platelet  Result Value Ref Range   WBC 4.7 3.4 - 10.8 x10E3/uL   RBC 3.83 3.77 - 5.28 x10E6/uL   Hemoglobin 11.2 11.1 - 15.9 g/dL   Hematocrit 16.1 09.6 - 46.6 %   MCV 89 79 - 97 fL   MCH 29.2 26.6 - 33.0 pg   MCHC 32.8 31.5 - 35.7 g/dL   RDW 04.5 (H) 40.9 - 81.1 %   Platelets 238 150 - 379 x10E3/uL   Neutrophils 43 Not Estab. %   Lymphs 42 Not Estab. %   Monocytes 9 Not Estab. %   Eos 5 Not Estab. %   Basos 1 Not Estab. %   Neutrophils Absolute 2.0 1.4 - 7.0 x10E3/uL   Lymphocytes Absolute 2.0 0.7 - 3.1 x10E3/uL   Monocytes Absolute 0.4 0.1 - 0.9 x10E3/uL   EOS (ABSOLUTE) 0.3 0.0 - 0.4 x10E3/uL   Basophils Absolute 0.0 0.0 - 0.2 x10E3/uL   Immature Granulocytes 0 Not Estab. %   Immature Grans (Abs) 0.0 0.0 - 0.1 x10E3/uL  Bayer DCA Hb A1c Waived  Result Value Ref Range   Bayer DCA Hb A1c Waived 7.1 (H) <7.0 %  Comprehensive metabolic panel  Result Value Ref Range   Glucose 184 (H) 65 - 99 mg/dL   BUN 34 (H) 6 - 24 mg/dL   Creatinine, Ser 9.14 (H) 0.57 - 1.00 mg/dL   GFR calc non Af Amer 48 (L) >59 mL/min/1.73   GFR calc Af Amer 55 (L) >59 mL/min/1.73   BUN/Creatinine Ratio 27 (H) 9 - 23   Sodium 139 134 - 144 mmol/L   Potassium 5.2 3.5 - 5.2 mmol/L   Chloride 100 96 - 106 mmol/L   CO2 22 18 - 29 mmol/L   Calcium 9.9 8.7 - 10.2 mg/dL   Total Protein 8.0 6.0 - 8.5 g/dL   Albumin 4.1 3.5 - 5.5 g/dL   Globulin, Total 3.9 1.5 - 4.5 g/dL   Albumin/Globulin Ratio 1.1 (L) 1.2 - 2.2   Bilirubin Total 0.6 0.0 - 1.2 mg/dL   Alkaline Phosphatase 87 39 - 117 IU/L   AST 16 0 - 40 IU/L   ALT 22 0 - 32 IU/L  Lipid Panel w/o Chol/HDL Ratio  Result Value Ref Range   Cholesterol, Total 179 100 - 199 mg/dL   Triglycerides 782 0 - 149 mg/dL   HDL 50 >95 mg/dL   VLDL Cholesterol Cal 26 5 - 40 mg/dL   LDL Calculated 621 (H) 0 - 99 mg/dL  Microalbumin, Urine Waived  Result Value Ref Range   Microalb, Ur Waived 10 0 - 19 mg/L   Creatinine, Urine Waived 100 10 - 300 mg/dL    Microalb/Creat Ratio <30 <30 mg/g  TSH  Result Value Ref Range   TSH <0.006 (L) 0.450 - 4.500 uIU/mL  UA/M w/rflx Culture, Routine  Result Value Ref Range   Specific Gravity, UA 1.010 1.005 - 1.030   pH, UA 5.0 5.0 - 7.5   Color, UA Yellow Yellow   Appearance Ur Clear Clear   Leukocytes, UA Negative Negative   Protein, UA Negative Negative/Trace   Glucose, UA Negative Negative   Ketones, UA Negative Negative   RBC, UA Negative Negative   Bilirubin, UA Negative Negative   Urobilinogen, Ur 0.2 0.2 - 1.0 mg/dL   Nitrite, UA Negative Negative   Microscopic Examination See below:   Hemoglobin A1c  Result Value Ref Range   Hemoglobin A1C 7.1       Assessment & Plan:   Problem List Items Addressed This Visit      Cardiovascular and Mediastinum   Chronic systolic heart failure (HCC) (Chronic)    Euvolemic today. Discussed weight gain. Start exercise again.         Digestive   Cirrhosis (HCC)    Seeing Dr. Servando Snare later this week. Call with any concerns.         Endocrine   Diabetes mellitus without complication (HCC)    Due for recheck in 2 months.       Multiple thyroid nodules    Seeing endocrine in July. Continue to monitor. Likely cause of cold and heat intolerance.         Other   Depression    Stable. Will get her into psychiatry. Note sent regarding this referral sent to referrals coordinator today.       Bipolar 1 disorder (HCC) - Primary    Stable. Will get her into psychiatry. Note sent regarding this referral sent to referrals coordinator today.        Other Visit Diagnoses    Bleeding from the nose       No sign of bleeding. Start AYR gel.    Arthralgia, unspecified joint       Likely multifactorial. Will get other issues under control and will see if they improve.        Follow up plan: Return in about 2 months (around 03/31/2017) for Follow up diabetes.

## 2017-01-29 NOTE — Assessment & Plan Note (Addendum)
Seeing endocrine in July. Continue to monitor. Likely cause of cold and heat intolerance.

## 2017-01-29 NOTE — Assessment & Plan Note (Signed)
Stable. Will get her into psychiatry. Note sent regarding this referral sent to referrals coordinator today.  

## 2017-01-31 ENCOUNTER — Ambulatory Visit (INDEPENDENT_AMBULATORY_CARE_PROVIDER_SITE_OTHER): Payer: Medicare Other | Admitting: Gastroenterology

## 2017-01-31 ENCOUNTER — Other Ambulatory Visit
Admission: RE | Admit: 2017-01-31 | Discharge: 2017-01-31 | Disposition: A | Discharge status: Home / Self Care | Payer: Medicare Other | Source: Ambulatory Visit | Attending: Gastroenterology | Admitting: Gastroenterology

## 2017-01-31 ENCOUNTER — Encounter: Payer: Self-pay | Admitting: Gastroenterology

## 2017-01-31 VITALS — BP 108/67 | HR 74 | Temp 98.2°F | Ht 64.0 in | Wt 183.0 lb

## 2017-01-31 DIAGNOSIS — K7460 Unspecified cirrhosis of liver: Secondary | ICD-10-CM

## 2017-01-31 NOTE — Progress Notes (Signed)
Primary Care Physician: Dorcas Carrow, DO  Primary Gastroenterologist:  Dr. Midge Minium  Chief Complaint  Patient presents with  . Cirrhosis    HPI: Bonnie Ochoa is a 51 y.o. female here follow-up due to a history of cirrhosis.  The patient had a workup for her possible cirrhosis and the conclusion was that the patient likely has cirrhosis due to her right heart failure which resulted in cirrhosis and ascites.  The patient was also noted to have fatty liver.  The patient comes in for follow-up now after seeing me back in October 2017 after moving from [place].  She has been doing well without any complaints.  The patient's liver enzymes were elevated back in December 2017 but now have returned to normal.  Current Outpatient Prescriptions  Medication Sig Dispense Refill  . atorvastatin (LIPITOR) 40 MG tablet Take 1 tablet (40 mg total) by mouth daily. 90 tablet 3  . carvedilol (COREG) 6.25 MG tablet Take 1 tablet (6.25 mg total) by mouth 2 (two) times daily with a meal. 180 tablet 3  . dicyclomine (BENTYL) 20 MG tablet Take 1 tablet (20 mg total) by mouth 3 (three) times daily as needed for spasms. 90 tablet 3  . furosemide (LASIX) 40 MG tablet Take 60 mg by mouth 2 (two) times daily.    Marland Kitchen LORazepam (ATIVAN) 0.5 MG tablet Take 0.5 mg by mouth every 8 (eight) hours as needed for anxiety.    . metolazone (ZAROXOLYN) 5 MG tablet Take 1 tablet (5 mg total) by mouth daily as needed. 90 tablet 3  . ondansetron (ZOFRAN ODT) 4 MG disintegrating tablet Take 1 tablet (4 mg total) by mouth every 8 (eight) hours as needed for nausea or vomiting. 20 tablet 0  . pantoprazole (PROTONIX) 40 MG tablet TAKE 1 TABLET (40 MG TOTAL) BY MOUTH DAILY. 30 tablet 2  . PARoxetine (PAXIL) 30 MG tablet Take 1 tablet (30 mg total) by mouth daily. 90 tablet 0  . potassium chloride (K-DUR,KLOR-CON) 10 MEQ tablet Take 20 mEq by mouth daily.     . prazosin (MINIPRESS) 1 MG capsule Take 1 mg by mouth at bedtime.    Marland Kitchen  QUEtiapine (SEROQUEL) 300 MG tablet Take 1 tablet (300 mg total) by mouth at bedtime. 90 tablet 0  . sacubitril-valsartan (ENTRESTO) 24-26 MG Take 1 tablet by mouth 2 (two) times daily. 60 tablet 3  . spironolactone (ALDACTONE) 25 MG tablet Take 1 tablet (25 mg total) by mouth daily. 90 tablet 3  . aspirin EC 81 MG tablet Take 1 tablet (81 mg total) by mouth daily. (Patient not taking: Reported on 01/29/2017) 30 tablet 2  . nitroGLYCERIN (NITROSTAT) 0.4 MG SL tablet Place 1 tablet (0.4 mg total) under the tongue every 5 (five) minutes as needed for chest pain. (Patient not taking: Reported on 01/29/2017) 30 tablet 12   No current facility-administered medications for this visit.     Allergies as of 01/31/2017  . (No Known Allergies)    ROS:  General: Negative for anorexia, weight loss, fever, chills, fatigue, weakness. ENT: Negative for hoarseness, difficulty swallowing , nasal congestion. CV: Negative for chest pain, angina, palpitations, dyspnea on exertion, peripheral edema.  Respiratory: Negative for dyspnea at rest, dyspnea on exertion, cough, sputum, wheezing.  GI: See history of present illness. GU:  Negative for dysuria, hematuria, urinary incontinence, urinary frequency, nocturnal urination.  Endo: Negative for unusual weight change.    Physical Examination:   BP 108/67   Pulse  74   Temp 98.2 F (36.8 C) (Oral)   Ht 5\' 4"  (1.626 m)   Wt 183 lb (83 kg)   BMI 31.41 kg/m   General: Well-nourished, well-developed in no acute distress.  Eyes: No icterus. Conjunctivae pink. Mouth: Oropharyngeal mucosa moist and pink , no lesions erythema or exudate. Lungs: Clear to auscultation bilaterally. Non-labored. Heart: Regular rate and rhythm, no murmurs rubs or gallops.  Abdomen: Bowel sounds are normal, nontender, nondistended, no hepatosplenomegaly or masses, no abdominal bruits or hernia , no rebound or guarding.   Extremities: No lower extremity edema. No clubbing or  deformities. Neuro: Alert and oriented x 3.  Grossly intact. Skin: Warm and dry, no jaundice.   Psych: Alert and cooperative, normal mood and affect.  Labs:    Imaging Studies: US Soft Tissue Head/neck  Result Date: 01/10/2017 CLINICAL DATA:  51 year old female with a history of low TSH level, hyperthyroid EXAM: THYROID ULTRASOUND TECHNIQUE: Ultrasound examination of the thyroid gland and adjacent soft tissues was performed. COMPARISON:  None. FINDINGS: Parenchymal Echotexture: Markedly heterogenous Isthmus: 0.9 cm Right lobe: 5.6 cm x 2.1 cm x 2.1 cm Left lobe: 5.8 cm x 1.9 cm x 2.5 cm _________________________________________________________ Estimated total number of nodules >/= 1 cm: 2 Number of spongiform nodules >/=  2 cm not described below (TR1): 0 Number of mixed cystic and solid nodules >/= 1.5 cm not described below (TR2): 0 _________________________________________________________ Nodule # 1: Location: Right; Inferior Maximum size: 1.5 cm; Other 2 dimensions: 1.5 cm x 1.4 cm Composition: solid/almost completely solid (2) Echogenicity: hyperechoic (1) Shape: taller-than-wide (3) Margins: ill-defined (0) Echogenic foci: none (0) ACR TI-RADS total points: 6. ACR TI-RADS risk category: TR4 (4-6 points). ACR TI-RADS recommendations: Nodule meets criteria for biopsy _________________________________________________________ Nodule # 2: Location: Left; Inferior Maximum size: 1.3 cm; Other 2 dimensions: 1.0 cm x 0.6 cm Composition: cannot determine (2) Echogenicity: hypoechoic (2) Shape: not taller-than-wide (0) Margins: smooth (0) Echogenic foci: none (0) ACR TI-RADS total points: 4. ACR TI-RADS risk category: TR4 (4-6 points). ACR TI-RADS recommendations: Nodule meets criteria for surveillance _________________________________________________________ No adenopathy IMPRESSION: Right inferior thyroid nodule (labeled 1) meets criteria for biopsy, as designated by the newly established ACR TI-RADS  criteria, and referral for biopsy is recommended. Left inferior thyroid nodule (labeled 2) meets criteria for surveillance, as designated by the newly established ACR TI-RADS criteria. Surveillance ultrasound study recommended to be performed annually up to 5 years. Recommendations follow those established by the new ACR TI-RADS criteria (J Am Coll Radiol 2017;14:587-595). Signed, Yvone Neu. Loreta Ave, DO [facility] Radiology Electronically Signed   By: Gilmer Mor D.O.   On: 01/10/2017 14:16    Assessment and Plan:   Janasia Coverdale is a 51 y.o. y/o female who has a history of cirrhosis from right heart failure versus fatty liver disease.  The patient enzymes are now stable and normal.  The patient will continue to see me every 6 months for [facility] surveillance.  The patient will have her blood checked for alpha-fetoprotein.  The patient recently had a CT scan of the abdomen therefore ultrasound is not needed at this time.  The patient will be notified of the lab results.    Midge Minium, MD. Clementeen Graham   Note: This dictation was prepared with Dragon dictation along with smaller phrase technology. Any transcriptional errors that result from this process are unintentional.

## 2017-02-01 ENCOUNTER — Telehealth: Payer: Self-pay

## 2017-02-01 ENCOUNTER — Other Ambulatory Visit: Payer: Medicare Other

## 2017-02-01 DIAGNOSIS — R7989 Other specified abnormal findings of blood chemistry: Secondary | ICD-10-CM

## 2017-02-01 DIAGNOSIS — R946 Abnormal results of thyroid function studies: Secondary | ICD-10-CM | POA: Diagnosis not present

## 2017-02-01 DIAGNOSIS — I10 Essential (primary) hypertension: Secondary | ICD-10-CM

## 2017-02-01 LAB — AFP TUMOR MARKER: AFP TUMOR MARKER: 3.1 ng/mL (ref 0.0–8.3)

## 2017-02-01 NOTE — Telephone Encounter (Signed)
-----   Message from Hyman Bible, CMA sent at 02/01/2017  9:43 AM EDT ----- Called Bonnie Ochoa patient no showed her appointment scheduled for 01/31/17. They have mailed a letter for patient to call and reschedule.  ----- Message ----- From: Hyman Bible, CMA Sent: 01/29/2017   5:09 PM To: Odai Wimmer L Ansley Mangiapane, CMA  Call about sleep study

## 2017-02-01 NOTE — Telephone Encounter (Signed)
Called and left a message for patient to return my call.  

## 2017-02-01 NOTE — Telephone Encounter (Signed)
-----   Message from Midge Minium, MD sent at 02/01/2017 12:40 PM EDT ----- Let the patient know that her tumor marker was negative.

## 2017-02-01 NOTE — Telephone Encounter (Signed)
Pt notified of lab results

## 2017-02-01 NOTE — Telephone Encounter (Signed)
Patient's sister notified of [facility] response.  Patient notified that she will need to get lab results from Dr.Wohl.

## 2017-02-01 NOTE — Telephone Encounter (Signed)
-----   Message from Hyman Bible, CMA sent at 01/29/2017  5:09 PM EDT ----- Call about sleep study

## 2017-02-01 NOTE — Telephone Encounter (Signed)
Patient in lobby with a relative who has an appointment. Patient would like to know her results. Informed patient provider is seeing patients and may also give her call back when possible.  Patient is aware.

## 2017-02-02 LAB — BASIC METABOLIC PANEL
BUN / CREAT RATIO: 17 (ref 9–23)
BUN: 25 mg/dL — AB (ref 6–24)
CHLORIDE: 97 mmol/L (ref 96–106)
CO2: 27 mmol/L (ref 20–29)
Calcium: 9.6 mg/dL (ref 8.7–10.2)
Creatinine, Ser: 1.47 mg/dL — ABNORMAL HIGH (ref 0.57–1.00)
GFR, EST AFRICAN AMERICAN: 47 mL/min/{1.73_m2} — AB (ref >59)
GFR, EST NON AFRICAN AMERICAN: 40 mL/min/{1.73_m2} — AB (ref >59)
Glucose: 259 mg/dL — ABNORMAL HIGH (ref 65–99)
Potassium: 4.6 mmol/L (ref 3.5–5.2)
Sodium: 138 mmol/L (ref 134–144)

## 2017-02-02 LAB — TSH: TSH: 10.12 u[IU]/mL — ABNORMAL HIGH (ref 0.450–4.500)

## 2017-02-04 ENCOUNTER — Encounter: Payer: Self-pay | Admitting: Family Medicine

## 2017-02-04 ENCOUNTER — Telehealth: Payer: Self-pay | Admitting: Family Medicine

## 2017-02-04 DIAGNOSIS — N289 Disorder of kidney and ureter, unspecified: Secondary | ICD-10-CM

## 2017-02-04 NOTE — Telephone Encounter (Signed)
Please let Bonnie Ochoa know that her kidney function got a little worse. I'd like her to drink a lot of water and then come back in 2 weeks to have her labs redrawn (order in).   Her thyroid is also off- now it's underactive. So I really want her to see the thyroid doctor on the 12th. Thanks!

## 2017-02-04 NOTE — Telephone Encounter (Signed)
Called patient, no answer, left message for patient to return my call.  

## 2017-02-05 NOTE — Telephone Encounter (Signed)
Patient notified

## 2017-02-06 ENCOUNTER — Encounter: Payer: Self-pay | Admitting: Family

## 2017-02-06 ENCOUNTER — Ambulatory Visit: Payer: Medicare Other | Attending: Family | Admitting: Family

## 2017-02-06 VITALS — BP 101/69 | HR 98 | Resp 20 | Ht 65.0 in | Wt 183.0 lb

## 2017-02-06 DIAGNOSIS — F4310 Post-traumatic stress disorder, unspecified: Secondary | ICD-10-CM | POA: Insufficient documentation

## 2017-02-06 DIAGNOSIS — F1290 Cannabis use, unspecified, uncomplicated: Secondary | ICD-10-CM | POA: Insufficient documentation

## 2017-02-06 DIAGNOSIS — G2581 Restless legs syndrome: Secondary | ICD-10-CM | POA: Insufficient documentation

## 2017-02-06 DIAGNOSIS — Z87891 Personal history of nicotine dependence: Secondary | ICD-10-CM | POA: Diagnosis not present

## 2017-02-06 DIAGNOSIS — F319 Bipolar disorder, unspecified: Secondary | ICD-10-CM | POA: Insufficient documentation

## 2017-02-06 DIAGNOSIS — I2510 Atherosclerotic heart disease of native coronary artery without angina pectoris: Secondary | ICD-10-CM | POA: Diagnosis present

## 2017-02-06 DIAGNOSIS — J45909 Unspecified asthma, uncomplicated: Secondary | ICD-10-CM | POA: Insufficient documentation

## 2017-02-06 DIAGNOSIS — I959 Hypotension, unspecified: Secondary | ICD-10-CM | POA: Diagnosis not present

## 2017-02-06 DIAGNOSIS — G4733 Obstructive sleep apnea (adult) (pediatric): Secondary | ICD-10-CM

## 2017-02-06 DIAGNOSIS — F909 Attention-deficit hyperactivity disorder, unspecified type: Secondary | ICD-10-CM | POA: Diagnosis not present

## 2017-02-06 DIAGNOSIS — K7460 Unspecified cirrhosis of liver: Secondary | ICD-10-CM | POA: Diagnosis not present

## 2017-02-06 DIAGNOSIS — F329 Major depressive disorder, single episode, unspecified: Secondary | ICD-10-CM

## 2017-02-06 DIAGNOSIS — R682 Dry mouth, unspecified: Secondary | ICD-10-CM | POA: Insufficient documentation

## 2017-02-06 DIAGNOSIS — I110 Hypertensive heart disease with heart failure: Secondary | ICD-10-CM | POA: Diagnosis not present

## 2017-02-06 DIAGNOSIS — I5022 Chronic systolic (congestive) heart failure: Secondary | ICD-10-CM | POA: Insufficient documentation

## 2017-02-06 DIAGNOSIS — G4700 Insomnia, unspecified: Secondary | ICD-10-CM | POA: Insufficient documentation

## 2017-02-06 DIAGNOSIS — K589 Irritable bowel syndrome without diarrhea: Secondary | ICD-10-CM | POA: Insufficient documentation

## 2017-02-06 DIAGNOSIS — Z7982 Long term (current) use of aspirin: Secondary | ICD-10-CM | POA: Diagnosis not present

## 2017-02-06 DIAGNOSIS — Z950 Presence of cardiac pacemaker: Secondary | ICD-10-CM | POA: Insufficient documentation

## 2017-02-06 DIAGNOSIS — E119 Type 2 diabetes mellitus without complications: Secondary | ICD-10-CM | POA: Diagnosis not present

## 2017-02-06 DIAGNOSIS — I10 Essential (primary) hypertension: Secondary | ICD-10-CM

## 2017-02-06 NOTE — Patient Instructions (Signed)
Continue weighing daily and call for an overnight weight gain of > 2 pounds or a weekly weight gain of >5 pounds. 

## 2017-02-06 NOTE — Progress Notes (Signed)
Patient ID: Bonnie Ochoa, female    DOB: June 03, 1966, 51 y.o.   MRN: [id number]  HPI  Ms Sorci is a 51 y/o female with a history of obstructive sleep apnea, restless leg syndrome, PTSD, HTN, DM, depression, CAD, cirrhosis, bipolar, asthma, marijuana use and chronic heart failure.   Last echo was done 05/28/16 and showed an EF of 20-25% along with moderate MR/TR and severely elevated PA pressure of 64 mm Hg.    In ED 12/08/16 with vomiting. CT was negative and she was discharged home. In the ED 12/07/16 due to dizziness with chest pain and hypotension. Given IV fluids, treated and discharged home.  Admitted 09/17/16 with HF exacerbation. Initially treated with IV diuretics and transitioned to oral diuretics. 120 cc of fluid removed from a paracentesis. Discharged home the following day. Admitted 09/10/16 with HF exacerbation along with abdominal pain. Abdominal CT showed worsening ascites and she had a paracentesis with fluid removal with improvement of her symptoms. Discharged home the next day. ED visit 07/31/16 due to viral syndrome. Evaluated and discharged home.   She presents today for her follow-up visit with a chief complaint of mild fatigue with moderate exertion. She describes this as having been present for years with varying levels of intensity. Does feel like her fatigue is worse as her thyroid disease has worsened. She has associated chest tightness and weight gain along with this.   Past Medical History:  Diagnosis Date  . ADHD   . Arthritis   . Asthma   . Bipolar 1 disorder (HCC)   . CHF (congestive heart failure) (HCC)   . Cirrhosis of liver (HCC)   . Coronary artery disease   . Depression   . Diabetes mellitus without complication (HCC)   . Diverticulitis   . Hypertension   . IBS (irritable bowel syndrome)   . Insomnia   . Migraines   . PTSD (post-traumatic stress disorder)   . Restless leg syndrome   . Sleep apnea   . Vertigo    Past Surgical History:  Procedure  Laterality Date  . ABDOMINAL HYSTERECTOMY    . debribalator  2016  . INSERT / REPLACE / REMOVE PACEMAKER    . INSERTION OF ICD    . TONSILECTOMY, ADENOIDECTOMY, BILATERAL MYRINGOTOMY AND TUBES    . TONSILLECTOMY    . TONSILLECTOMY    . TUBAL LIGATION  1980  . TUBAL LIGATION     Family History  Problem Relation Age of Onset  . Hypertension Mother   . Hyperlipidemia Mother   . Heart disease Mother   . Hypertension Sister   . Asthma Sister   . Heart disease Sister   . Diabetes Sister   . Cancer Sister   . Alzheimer's disease Maternal Grandfather   . Hyperlipidemia Brother   . Asthma Sister   . Hypertension Sister   . Diabetes Sister    Social History  Substance Use Topics  . Smoking status: Former Games developer  . Smokeless tobacco: Never Used  . Alcohol use No   No Known Allergies  . Prior to Admission medications   Medication Sig Start Date End Date Taking? Authorizing Provider  aspirin EC 81 MG tablet Take 1 tablet (81 mg total) by mouth daily. 06/04/16  Yes Enid Baas, MD  atorvastatin (LIPITOR) 40 MG tablet Take 1 tablet (40 mg total) by mouth daily. 08/21/16  Yes Antonieta Iba, MD  carvedilol (COREG) 6.25 MG tablet Take 1 tablet (6.25 mg total) by mouth 2 (  two) times daily with a meal. 08/21/16  Yes Gollan, Tollie Pizza, MD  dicyclomine (BENTYL) 20 MG tablet Take 1 tablet (20 mg total) by mouth 3 (three) times daily as needed for spasms. 12/28/16  Yes Johnson, Megan P, DO  furosemide (LASIX) 40 MG tablet Take 60 mg by mouth 2 (two) times daily.   Yes [provider]  LORazepam (ATIVAN) 0.5 MG tablet Take 0.5 mg by mouth every 8 (eight) hours as needed for anxiety.   Yes [provider]  metolazone (ZAROXOLYN) 5 MG tablet Take 1 tablet (5 mg total) by mouth daily as needed. 12/31/16 03/31/17 Yes Gollan, Tollie Pizza, MD  nitroGLYCERIN (NITROSTAT) 0.4 MG SL tablet Place 1 tablet (0.4 mg total) under the tongue every 5 (five) minutes as needed for chest pain.  06/04/16  Yes Enid Baas, MD  ondansetron (ZOFRAN ODT) 4 MG disintegrating tablet Take 1 tablet (4 mg total) by mouth every 8 (eight) hours as needed for nausea or vomiting. 12/07/16  Yes Emily Filbert, MD  pantoprazole (PROTONIX) 40 MG tablet TAKE 1 TABLET (40 MG TOTAL) BY MOUTH DAILY. 01/14/17 01/14/18 Yes Midge Minium, MD  PARoxetine (PAXIL) 30 MG tablet Take 1 tablet (30 mg total) by mouth daily. 12/28/16  Yes Johnson, Megan P, DO  potassium chloride (K-DUR,KLOR-CON) 10 MEQ tablet Take 20 mEq by mouth daily.    Yes [provider]  prazosin (MINIPRESS) 1 MG capsule Take 1 mg by mouth at bedtime.   Yes [provider]  QUEtiapine (SEROQUEL) 300 MG tablet Take 1 tablet (300 mg total) by mouth at bedtime. 12/28/16  Yes Johnson, Megan P, DO  sacubitril-valsartan (ENTRESTO) 24-26 MG Take 1 tablet by mouth 2 (two) times daily. 10/05/16  Yes Clarisa Kindred A, FNP  spironolactone (ALDACTONE) 25 MG tablet Take 1 tablet (25 mg total) by mouth daily. 08/21/16  Yes Antonieta Iba, MD    Review of Systems  Constitutional: Positive for fatigue. Negative for appetite change.  HENT: Negative for congestion, nosebleeds and sore throat.   Eyes: Negative.   Respiratory: Positive for chest tightness. Negative for cough and shortness of breath.   Cardiovascular: Negative for chest pain, palpitations and leg swelling.  Gastrointestinal: Negative for abdominal distention and abdominal pain.  Endocrine: Negative.   Genitourinary: Negative.   Musculoskeletal: Negative for arthralgias and neck pain.  Skin: Negative.   Allergic/Immunologic: Negative.   Neurological: Positive for headaches (migraines). Negative for light-headedness.  Hematological: Negative for adenopathy. Does not bruise/bleed easily.  Psychiatric/Behavioral: Positive for dysphoric mood. Negative for sleep disturbance (sleeping on 2 pillows) and suicidal ideas. The patient is nervous/anxious.    Vitals:   02/06/17 1007   BP: 101/69  Pulse: 98  Resp: 20  SpO2: 99%  Weight: 183 lb (83 kg)  Height: 5\' 5"  (1.651 m)   Wt Readings from Last 3 Encounters:  02/06/17 183 lb (83 kg)  01/31/17 183 lb (83 kg)  01/29/17 181 lb 11.2 oz (82.4 kg)    Lab Results  Component Value Date   CREATININE 1.47 (H) 02/01/2017   CREATININE 1.28 (H) 12/28/2016   CREATININE 1.15 (H) 12/08/2016    Physical Exam  Constitutional: She is oriented to person, place, and time. She appears well-developed and well-nourished.  HENT:  Head: Normocephalic and atraumatic.  Neck: Normal range of motion. Neck supple. No JVD present.  Cardiovascular: Normal rate and regular rhythm.   Pulmonary/Chest: Effort normal. She has no wheezes. She has no rales.  Abdominal: Soft. She  exhibits no distension. There is no tenderness.  Musculoskeletal: She exhibits no edema or tenderness.  Neurological: She is alert and oriented to person, place, and time.  Skin: Skin is warm and dry.  Psychiatric: She has a normal mood and affect. Her behavior is normal. Thought content normal.  Nursing note and vitals reviewed.   Assessment & Plan:  1: Chronic heart failure with reduced ejection fraction- - NYHA class II - euvolemic - weighing daily. She's to call for an overnight weight gain of >2 pounds or a weekly weight gain of >5 pounds.  - weight up 23.8 pounds since she was last here 12/04/16. She admits to eating more and not exercising. Also mentions her thyroid is now underactive - not adding salt to her food and is trying to eat low sodium foods. Continues to read food labels so that she can keep her sodium intake to 2000mg  daily - drinking "lots" of fluids daily due to dry mouth. Discussed the importance of maintaining her fluid intake between 40-48 ounces of fluid daily. Can suck on sugar free candy - she's had medication adjusted for her thyroid so will not adjust entresto/carvedilol today but discussed doing that at next visit - saw  cardiologist Mariah Milling) 11/19/16  2: HTN- - BP looks good today - has PCP appointment scheduled for 12/28/16  3: Obstructive sleep apnea- - they have to reschedule sleep test  4: Depression- - does have a history of PTSD - no suicidal thoughts at this time  Return here in 1 month or sooner for any questions/problems before then.

## 2017-02-20 ENCOUNTER — Other Ambulatory Visit: Payer: Medicare Other

## 2017-02-20 DIAGNOSIS — N289 Disorder of kidney and ureter, unspecified: Secondary | ICD-10-CM | POA: Diagnosis not present

## 2017-02-21 ENCOUNTER — Telehealth: Payer: Self-pay | Admitting: Family Medicine

## 2017-02-21 DIAGNOSIS — N189 Chronic kidney disease, unspecified: Secondary | ICD-10-CM

## 2017-02-21 DIAGNOSIS — I5022 Chronic systolic (congestive) heart failure: Secondary | ICD-10-CM

## 2017-02-21 DIAGNOSIS — E1122 Type 2 diabetes mellitus with diabetic chronic kidney disease: Secondary | ICD-10-CM

## 2017-02-21 DIAGNOSIS — E042 Nontoxic multinodular goiter: Secondary | ICD-10-CM | POA: Diagnosis not present

## 2017-02-21 DIAGNOSIS — N289 Disorder of kidney and ureter, unspecified: Principal | ICD-10-CM

## 2017-02-21 DIAGNOSIS — R946 Abnormal results of thyroid function studies: Secondary | ICD-10-CM | POA: Diagnosis not present

## 2017-02-21 DIAGNOSIS — G4733 Obstructive sleep apnea (adult) (pediatric): Secondary | ICD-10-CM | POA: Diagnosis not present

## 2017-02-21 DIAGNOSIS — E669 Obesity, unspecified: Secondary | ICD-10-CM | POA: Diagnosis not present

## 2017-02-21 DIAGNOSIS — I129 Hypertensive chronic kidney disease with stage 1 through stage 4 chronic kidney disease, or unspecified chronic kidney disease: Secondary | ICD-10-CM

## 2017-02-21 LAB — BASIC METABOLIC PANEL
BUN/Creatinine Ratio: 32 — ABNORMAL HIGH (ref 9–23)
BUN: 61 mg/dL — AB (ref 6–24)
CALCIUM: 9.9 mg/dL (ref 8.7–10.2)
CHLORIDE: 91 mmol/L — AB (ref 96–106)
CO2: 25 mmol/L (ref 20–29)
Creatinine, Ser: 1.91 mg/dL — ABNORMAL HIGH (ref 0.57–1.00)
GFR calc Af Amer: 34 mL/min/{1.73_m2} — ABNORMAL LOW (ref >59)
GFR calc non Af Amer: 29 mL/min/{1.73_m2} — ABNORMAL LOW (ref >59)
GLUCOSE: 295 mg/dL — AB (ref 65–99)
Potassium: 4.5 mmol/L (ref 3.5–5.2)
Sodium: 134 mmol/L (ref 134–144)

## 2017-02-21 NOTE — Telephone Encounter (Signed)
Patient's sister notified.

## 2017-02-21 NOTE — Telephone Encounter (Signed)
Please let her know that her kidney function has gotten worse. I'd like her to see the kidney doctor. I've put in a referral for her and they should be calling her soon. Urgent referral to nephrology placed today.

## 2017-02-25 ENCOUNTER — Ambulatory Visit (INDEPENDENT_AMBULATORY_CARE_PROVIDER_SITE_OTHER): Payer: Medicare Other | Admitting: *Deleted

## 2017-02-25 ENCOUNTER — Other Ambulatory Visit: Payer: Self-pay | Admitting: Family

## 2017-02-25 ENCOUNTER — Telehealth: Payer: Self-pay | Admitting: Cardiology

## 2017-02-25 DIAGNOSIS — I428 Other cardiomyopathies: Secondary | ICD-10-CM | POA: Diagnosis not present

## 2017-02-25 MED ORDER — SACUBITRIL-VALSARTAN 24-26 MG PO TABS: 1.0000 | ORAL_TABLET | Freq: Two times a day (BID) | ORAL | 3 refills | Status: DC

## 2017-02-25 NOTE — Telephone Encounter (Signed)
Spoke with pt and reminded pt of remote transmission that is due today. Pt verbalized understanding.   

## 2017-02-26 DIAGNOSIS — G4731 Primary central sleep apnea: Secondary | ICD-10-CM | POA: Diagnosis not present

## 2017-02-26 NOTE — Progress Notes (Signed)
Remote ICD transmission.   

## 2017-02-27 ENCOUNTER — Encounter: Payer: Self-pay | Admitting: Cardiology

## 2017-02-27 LAB — CUP PACEART REMOTE DEVICE CHECK
Battery Remaining Longevity: 89 mo
Battery Remaining Percentage: 81 %
Brady Statistic RV Percent Paced: 1 %
HIGH POWER IMPEDANCE MEASURED VALUE: 81 Ohm
HighPow Impedance: 81 Ohm
Lead Channel Impedance Value: 410 Ohm
Lead Channel Pacing Threshold Amplitude: 1 V
Lead Channel Sensing Intrinsic Amplitude: 11.8 mV
Lead Channel Setting Pacing Amplitude: 2.5 V
Lead Channel Setting Sensing Sensitivity: 0.5 mV
MDC IDC LEAD IMPLANT DT: 20160410
MDC IDC LEAD LOCATION: 753860
MDC IDC MSMT BATTERY VOLTAGE: 3.04 V
MDC IDC MSMT LEADCHNL RV PACING THRESHOLD PULSEWIDTH: 0.5 ms
MDC IDC PG IMPLANT DT: 20160410
MDC IDC PG SERIAL: 7259964
MDC IDC SESS DTM: 20180717021616
MDC IDC SET LEADCHNL RV PACING PULSEWIDTH: 0.5 ms

## 2017-02-28 ENCOUNTER — Ambulatory Visit: Payer: Medicare Other | Admitting: Family Medicine

## 2017-03-01 NOTE — Progress Notes (Signed)
No ICM remote transmission received for 02/25/2017 and next ICM transmission scheduled for 03/11/2017.

## 2017-03-06 DIAGNOSIS — G4733 Obstructive sleep apnea (adult) (pediatric): Secondary | ICD-10-CM | POA: Diagnosis not present

## 2017-03-11 ENCOUNTER — Ambulatory Visit: Payer: Medicare Other | Attending: Family | Admitting: Family

## 2017-03-11 ENCOUNTER — Encounter: Payer: Self-pay | Admitting: Family

## 2017-03-11 ENCOUNTER — Ambulatory Visit (INDEPENDENT_AMBULATORY_CARE_PROVIDER_SITE_OTHER): Payer: Medicare Other

## 2017-03-11 VITALS — BP 117/84 | HR 84 | Resp 18 | Ht 65.0 in | Wt 189.5 lb

## 2017-03-11 DIAGNOSIS — Z8249 Family history of ischemic heart disease and other diseases of the circulatory system: Secondary | ICD-10-CM | POA: Diagnosis not present

## 2017-03-11 DIAGNOSIS — Z95810 Presence of automatic (implantable) cardiac defibrillator: Secondary | ICD-10-CM

## 2017-03-11 DIAGNOSIS — E079 Disorder of thyroid, unspecified: Secondary | ICD-10-CM | POA: Diagnosis not present

## 2017-03-11 DIAGNOSIS — Z87891 Personal history of nicotine dependence: Secondary | ICD-10-CM | POA: Diagnosis not present

## 2017-03-11 DIAGNOSIS — K589 Irritable bowel syndrome without diarrhea: Secondary | ICD-10-CM | POA: Diagnosis not present

## 2017-03-11 DIAGNOSIS — I5022 Chronic systolic (congestive) heart failure: Secondary | ICD-10-CM

## 2017-03-11 DIAGNOSIS — G4733 Obstructive sleep apnea (adult) (pediatric): Secondary | ICD-10-CM | POA: Diagnosis not present

## 2017-03-11 DIAGNOSIS — Z7982 Long term (current) use of aspirin: Secondary | ICD-10-CM | POA: Insufficient documentation

## 2017-03-11 DIAGNOSIS — I110 Hypertensive heart disease with heart failure: Secondary | ICD-10-CM | POA: Diagnosis not present

## 2017-03-11 DIAGNOSIS — Z79899 Other long term (current) drug therapy: Secondary | ICD-10-CM | POA: Insufficient documentation

## 2017-03-11 DIAGNOSIS — J45909 Unspecified asthma, uncomplicated: Secondary | ICD-10-CM | POA: Diagnosis not present

## 2017-03-11 DIAGNOSIS — Z825 Family history of asthma and other chronic lower respiratory diseases: Secondary | ICD-10-CM | POA: Insufficient documentation

## 2017-03-11 DIAGNOSIS — I2510 Atherosclerotic heart disease of native coronary artery without angina pectoris: Secondary | ICD-10-CM | POA: Diagnosis not present

## 2017-03-11 DIAGNOSIS — I10 Essential (primary) hypertension: Secondary | ICD-10-CM

## 2017-03-11 DIAGNOSIS — M1990 Unspecified osteoarthritis, unspecified site: Secondary | ICD-10-CM | POA: Diagnosis not present

## 2017-03-11 DIAGNOSIS — F319 Bipolar disorder, unspecified: Secondary | ICD-10-CM | POA: Insufficient documentation

## 2017-03-11 DIAGNOSIS — F4310 Post-traumatic stress disorder, unspecified: Secondary | ICD-10-CM | POA: Insufficient documentation

## 2017-03-11 DIAGNOSIS — G2581 Restless legs syndrome: Secondary | ICD-10-CM | POA: Diagnosis not present

## 2017-03-11 DIAGNOSIS — E119 Type 2 diabetes mellitus without complications: Secondary | ICD-10-CM | POA: Insufficient documentation

## 2017-03-11 DIAGNOSIS — Z833 Family history of diabetes mellitus: Secondary | ICD-10-CM | POA: Insufficient documentation

## 2017-03-11 DIAGNOSIS — F909 Attention-deficit hyperactivity disorder, unspecified type: Secondary | ICD-10-CM | POA: Insufficient documentation

## 2017-03-11 DIAGNOSIS — F329 Major depressive disorder, single episode, unspecified: Secondary | ICD-10-CM

## 2017-03-11 DIAGNOSIS — G4700 Insomnia, unspecified: Secondary | ICD-10-CM | POA: Diagnosis not present

## 2017-03-11 DIAGNOSIS — K7460 Unspecified cirrhosis of liver: Secondary | ICD-10-CM | POA: Diagnosis not present

## 2017-03-11 DIAGNOSIS — I509 Heart failure, unspecified: Secondary | ICD-10-CM | POA: Diagnosis present

## 2017-03-11 NOTE — Patient Instructions (Signed)
Continue weighing daily and call for an overnight weight gain of > 2 pounds or a weekly weight gain of >5 pounds. 

## 2017-03-11 NOTE — Progress Notes (Signed)
EPIC Encounter for ICM Monitoring  Patient Name: Bonnie Ochoa is a 51 y.o. female Date: 03/11/2017 Primary Care Physican: Valerie Roys, DO Primary Cardiologist: Sanjuana Letters, NP Electrophysiologist: Faustino Congress Weight:Last office weight 166 lbs        Patient has office visit with [name], NP today.   Thoracic impedance normal but was abnormal suggesting fluid accumulation 02/21/2017 to 03/07/2017.  Prescribed dosage: Furosemide 40 mg 1.5 tablets (60 mg total) by mouth 2 (two) times daily. Potassium 10 mEq 2 tablet (20 mEq total) daily.  Metolazone 5 mg 1 tablet as needed.   Labs: 12/28/2016 Creatinine 1.28, BUN 34, Potassium 5.2, Sodium 139, EGFR 48-55 12/08/2016 Creatinine 1.15, BUN 30, Potassium 3.9, Sodium 138, EGFR 54->60 12/07/2016 Creatinine 1.14, BUN 32, Potassium 4.2, Sodium 135, EGFR 52->60 12/04/2016 Creatinine 1.18, BUN 30, Potassium 3.9, Sodium 137, EGFR 54->60 11/05/2016 Creatinine 0.81, BUN 22, Potassium 3.9, Sodium 136, EGFR >60 09/18/2016 Creatinine 1.34, BUN 28, Potassium 3.3, Sodium 138, EGFR 45-52  09/17/2016 Creatinine 1.20, BUN 24, Potassium 3.4, Sodium 138, EGFR 51-59  09/11/2016 Creatinine 1.14, BUN 22, Potassium 3.3, Sodium 138, EGFR 54->60  01/28/2018Creatinine 1.13, BUN 24, Potassium 3.6, Sodium 136, EGFR 55->60   Recommendations:  Any recommendations will be made during office visit today.   Follow-up plan: ICM clinic phone appointment on 04/11/2017.   Copy of ICM check sent to [name], NP and Dr Caryl Comes.   3 month ICM trend: 03/11/2017   1 Year ICM trend:      Rosalene Billings, RN 03/11/2017 7:51 AM

## 2017-03-11 NOTE — Progress Notes (Signed)
Patient ID: Bonnie Ochoa, female    DOB: [birth year], 51 y.o.   MRN: [id number]  HPI  [name] is a 51 y/o female with a history of obstructive sleep apnea, restless leg syndrome, PTSD, HTN, DM, depression, CAD, cirrhosis, bipolar, asthma, marijuana use and chronic heart failure.   Last echo was done 05/28/16 and showed an EF of 20-25% along with moderate MR/TR and severely elevated PA pressure of 64 mm Hg.    In ED 12/08/16 with vomiting. CT was negative and she was discharged home. In the ED 12/07/16 due to dizziness with chest pain and hypotension. Given IV fluids, treated and discharged home.  Admitted 09/17/16 with HF exacerbation. Initially treated with IV diuretics and transitioned to oral diuretics. 120 cc of fluid removed from a paracentesis. Discharged home the following day. Admitted 09/10/16 with HF exacerbation along with abdominal pain. Abdominal CT showed worsening ascites and she had a paracentesis with fluid removal with improvement of her symptoms. Discharged home the next day. ED visit 07/31/16 due to viral syndrome. Evaluated and discharged home.   She presents today for her follow-up visit with a chief complaint of mild fatigue upon moderate exertion. She describes this as being chronic in nature having been present for several years with varying levels of severity. She has associated light-headedness, chest tightness, depression and weight gain along with this.   Past Medical History:  Diagnosis Date  . ADHD   . Arthritis   . Asthma   . Bipolar 1 disorder (HCC)   . CHF (congestive heart failure) (HCC)   . Cirrhosis of liver (HCC)   . Coronary artery disease   . Depression   . Diabetes mellitus without complication (HCC)   . Diverticulitis   . Hypertension   . IBS (irritable bowel syndrome)   . Insomnia   . Migraines   . PTSD (post-traumatic stress disorder)   . Restless leg syndrome   . Sleep apnea   . Vertigo    Past Surgical History:  Procedure Laterality Date   . ABDOMINAL HYSTERECTOMY    . debribalator  2016  . INSERT / REPLACE / REMOVE PACEMAKER    . INSERTION OF ICD    . TONSILECTOMY, ADENOIDECTOMY, BILATERAL MYRINGOTOMY AND TUBES    . TONSILLECTOMY    . TONSILLECTOMY    . TUBAL LIGATION  1980  . TUBAL LIGATION     Family History  Problem Relation Age of Onset  . Hypertension Mother   . Hyperlipidemia Mother   . Heart disease Mother   . Hypertension Sister   . Asthma Sister   . Heart disease Sister   . Diabetes Sister   . Cancer Sister   . Alzheimer's disease Maternal Grandfather   . Hyperlipidemia Brother   . Asthma Sister   . Hypertension Sister   . Diabetes Sister    Social History  Substance Use Topics  . Smoking status: Former Games developer  . Smokeless tobacco: Never Used  . Alcohol use No   No Known Allergies  Prior to Admission medications   Medication Sig Start Date End Date Taking? Authorizing Provider  aspirin EC 81 MG tablet Take 1 tablet (81 mg total) by mouth daily. 06/04/16  Yes Enid Baas, MD  atorvastatin (LIPITOR) 40 MG tablet Take 1 tablet (40 mg total) by mouth daily. 08/21/16  Yes Antonieta Iba, MD  carvedilol (COREG) 6.25 MG tablet Take 1 tablet (6.25 mg total) by mouth 2 (two) times daily with a meal. 08/21/16  Yes Antonieta Iba, MD  dicyclomine (BENTYL) 20 MG tablet Take 1 tablet (20 mg total) by mouth 3 (three) times daily as needed for spasms. 12/28/16  Yes Johnson, Megan P, DO  furosemide (LASIX) 40 MG tablet Take 60 mg by mouth 2 (two) times daily.   Yes [provider]  LORazepam (ATIVAN) 0.5 MG tablet Take 0.5 mg by mouth every 8 (eight) hours as needed for anxiety.   Yes [provider]  metolazone (ZAROXOLYN) 5 MG tablet Take 1 tablet (5 mg total) by mouth daily as needed. 12/31/16 03/31/17 Yes Gollan, Tollie Pizza, MD  nitroGLYCERIN (NITROSTAT) 0.4 MG SL tablet Place 1 tablet (0.4 mg total) under the tongue every 5 (five) minutes as needed for chest pain. 06/04/16  Yes  Enid Baas, MD  ondansetron (ZOFRAN ODT) 4 MG disintegrating tablet Take 1 tablet (4 mg total) by mouth every 8 (eight) hours as needed for nausea or vomiting. 12/07/16  Yes Emily Filbert, MD  pantoprazole (PROTONIX) 40 MG tablet TAKE 1 TABLET (40 MG TOTAL) BY MOUTH DAILY. 01/14/17 01/14/18 Yes Midge Minium, MD  PARoxetine (PAXIL) 30 MG tablet Take 1 tablet (30 mg total) by mouth daily. 12/28/16  Yes Johnson, Megan P, DO  potassium chloride (K-DUR,KLOR-CON) 10 MEQ tablet Take 20 mEq by mouth daily.    Yes [provider]  prazosin (MINIPRESS) 1 MG capsule Take 1 mg by mouth at bedtime.   Yes [provider]  QUEtiapine (SEROQUEL) 300 MG tablet Take 1 tablet (300 mg total) by mouth at bedtime. 12/28/16  Yes Johnson, Megan P, DO  sacubitril-valsartan (ENTRESTO) 24-26 MG Take 1 tablet by mouth 2 (two) times daily. 02/25/17  Yes Clarisa Kindred A, FNP  spironolactone (ALDACTONE) 25 MG tablet Take 1 tablet (25 mg total) by mouth daily. 08/21/16  Yes Antonieta Iba, MD   Review of Systems  Constitutional: Positive for fatigue. Negative for appetite change.  HENT: Negative for congestion, nosebleeds and sore throat.   Eyes: Negative.   Respiratory: Positive for chest tightness. Negative for cough and shortness of breath.   Cardiovascular: Negative for chest pain, palpitations and leg swelling.  Gastrointestinal: Negative for abdominal distention and abdominal pain.  Endocrine: Negative.   Genitourinary: Negative.   Musculoskeletal: Negative for arthralgias and neck pain.  Skin: Negative.   Allergic/Immunologic: Negative.   Neurological: Positive for light-headedness. Negative for dizziness.  Hematological: Negative for adenopathy. Does not bruise/bleed easily.  Psychiatric/Behavioral: Positive for dysphoric mood (worse due to nightmares about mother). Negative for sleep disturbance (sleeping on 2 pillows) and suicidal ideas. The patient is nervous/anxious.    Vitals:    03/11/17 1055  BP: 117/84  Pulse: 84  Resp: 18  SpO2: 100%  Weight: 189 lb 8 oz (86 kg)  Height: 5\' 5"  (1.651 m)   Wt Readings from Last 3 Encounters:  03/11/17 189 lb 8 oz (86 kg)  02/06/17 183 lb (83 kg)  01/31/17 183 lb (83 kg)    Lab Results  Component Value Date   CREATININE 1.91 (H) 02/20/2017   CREATININE 1.47 (H) 02/01/2017   CREATININE 1.28 (H) 12/28/2016    Physical Exam  Constitutional: She is oriented to person, place, and time. She appears well-developed and well-nourished.  HENT:  Head: Normocephalic and atraumatic.  Neck: Normal range of motion. Neck supple. No JVD present.  Cardiovascular: Normal rate and regular rhythm.   Pulmonary/Chest: Effort normal. She has no wheezes. She has no rales.  Abdominal: Soft. She exhibits no  distension. There is no tenderness.  Musculoskeletal: She exhibits no edema or tenderness.  Neurological: She is alert and oriented to person, place, and time.  Skin: Skin is warm and dry.  Psychiatric: She has a normal mood and affect. Her behavior is normal. Thought content normal.  Nursing note and vitals reviewed.   Assessment & Plan:  1: Chronic heart failure with reduced ejection fraction- - NYHA class II - euvolemic - weighing daily. She's to call for an overnight weight gain of >2 pounds or a weekly weight gain of >5 pounds.  - weight up another 6 pounds since she was last here. She admits to eating more junk food, eating late at night and not exercising.  - not adding salt to her food and is trying to eat low sodium foods. Continues to read food labels so that she can keep her sodium intake to 2000mg  daily. Does admit that she probably gets too much salt depending on who is doing the cooking that day - drinking "lots" of fluids daily due to dry mouth. Discussed the importance of maintaining her fluid intake between 40-48 ounces of fluid daily. Can suck on sugar free candy - experiencing some dizziness so will not increase  entresto at this time; discussed doing so in the future - saw cardiologist Mariah Milling) 11/19/16  2: HTN- - BP looks good today - saw PCP Laural Benes) 01/29/17  3: Obstructive sleep apnea- - has had it done and had titration portion done about one week ago - waiting to hear results from that  4: Depression- - does have a history of PTSD - no suicidal thoughts at this time - thyroid disease currently being worked up by endocrinologist and will be having a biopsy soon  Patient did not bring her medications nor a list. Each medication was verbally reviewed with the patient and she was encouraged to bring the bottles to every visit to confirm accuracy of list.  Return here in 2 months or sooner for any questions/problems before then.

## 2017-03-12 ENCOUNTER — Telehealth: Payer: Self-pay | Admitting: Family Medicine

## 2017-03-12 NOTE — Telephone Encounter (Signed)
Received a call from [facility] from [facility] regarding the referral they need his labs, notes, and reason for referral   Fax # [phone]   thanks

## 2017-03-13 NOTE — Telephone Encounter (Signed)
Notes faxed.

## 2017-03-19 DIAGNOSIS — E042 Nontoxic multinodular goiter: Secondary | ICD-10-CM | POA: Diagnosis not present

## 2017-03-24 ENCOUNTER — Other Ambulatory Visit: Payer: Self-pay | Admitting: Family Medicine

## 2017-04-10 ENCOUNTER — Other Ambulatory Visit: Payer: Self-pay | Admitting: Family Medicine

## 2017-04-10 ENCOUNTER — Other Ambulatory Visit: Payer: Self-pay | Admitting: Cardiovascular Disease

## 2017-04-10 DIAGNOSIS — N179 Acute kidney failure, unspecified: Secondary | ICD-10-CM | POA: Diagnosis not present

## 2017-04-10 MED ORDER — METOLAZONE 5 MG PO TABS: 5.0000 mg | ORAL_TABLET | Freq: Every day | ORAL | 0 refills | Status: DC | PRN

## 2017-04-11 ENCOUNTER — Ambulatory Visit (INDEPENDENT_AMBULATORY_CARE_PROVIDER_SITE_OTHER): Payer: Medicare Other

## 2017-04-11 ENCOUNTER — Telehealth: Payer: Self-pay

## 2017-04-11 DIAGNOSIS — Z95810 Presence of automatic (implantable) cardiac defibrillator: Secondary | ICD-10-CM | POA: Diagnosis not present

## 2017-04-11 DIAGNOSIS — I5022 Chronic systolic (congestive) heart failure: Secondary | ICD-10-CM | POA: Diagnosis not present

## 2017-04-11 NOTE — Telephone Encounter (Signed)
Remote ICM transmission received.  Attempted call to patient and person answering phone stated she was not home.  

## 2017-04-11 NOTE — Progress Notes (Signed)
EPIC Encounter for ICM Monitoring  Patient Name: Bonnie Ochoa is a 51 y.o. female Date: 04/11/2017 Primary Care Physican: Valerie Roys, DO Primary Cardiologist: Sanjuana Letters, NP Electrophysiologist: Faustino Congress Weight:Last office weight 166 lbs      Attempted call to patient and unable to reach.   Transmission reviewed.    Thoracic impedance normal.  Prescribed dosage:  Furosemide 40 mg 1.5 tablets (60 mg total) by mouth 2 (two) times daily. Potassium 10 mEq 2tablet (20 mEq total) daily. Metolazone 5 mg 1 tablet as needed.   Labs: 02/20/2017 Creatinine 1.91, BUN 61, Potassium 4.5, Sodium 134, EGFR 29-34 02/01/2017 Creatinine 1.47, BUN 25, Potassium 4.6, Sodium 138, EGFR 40-47 12/28/2016 Creatinine 1.28, BUN 34, Potassium 5.2, Sodium 139, EGFR 48-55 12/08/2016 Creatinine 1.15, BUN 30, Potassium 3.9, Sodium 138, EGFR 54->60 12/07/2016 Creatinine 1.14, BUN 32, Potassium 4.2, Sodium 135, EGFR 52->60 12/04/2016 Creatinine 1.18, BUN 30, Potassium 3.9, Sodium 137, EGFR 54->60 11/05/2016 Creatinine 0.81, BUN 22, Potassium 3.9, Sodium 136, EGFR >60 09/18/2016 Creatinine 1.34, BUN 28, Potassium 3.3, Sodium 138, EGFR 45-52  09/17/2016 Creatinine 1.20, BUN 24, Potassium 3.4, Sodium 138, EGFR 51-59  09/11/2016 Creatinine 1.14, BUN 22, Potassium 3.3, Sodium 138, EGFR 54->60  01/28/2018Creatinine 1.13, BUN 24, Potassium 3.6, Sodium 136, EGFR 55->60   Recommendations:NONE - Unable to reach patient   Follow-up plan: ICM clinic phone appointment on 05/27/2017.  Office appointment scheduled 04/22/2017 with [name] NP.  Copy of ICM check sent to Dr Caryl Comes.   3 month ICM trend: 04/11/2017   1 Year ICM trend:      Rosalene Billings, RN 04/11/2017 10:05 AM

## 2017-04-15 ENCOUNTER — Other Ambulatory Visit: Payer: Self-pay | Admitting: Family Medicine

## 2017-04-15 ENCOUNTER — Other Ambulatory Visit: Payer: Self-pay | Admitting: Gastroenterology

## 2017-04-22 ENCOUNTER — Ambulatory Visit: Payer: Medicare Other | Admitting: Family

## 2017-04-23 DIAGNOSIS — E042 Nontoxic multinodular goiter: Secondary | ICD-10-CM | POA: Diagnosis not present

## 2017-04-29 ENCOUNTER — Encounter: Payer: Self-pay | Admitting: Family

## 2017-04-29 ENCOUNTER — Ambulatory Visit: Payer: Medicare Other | Attending: Family | Admitting: Family

## 2017-04-29 VITALS — BP 108/67 | HR 70 | Resp 18 | Ht 65.0 in | Wt 199.1 lb

## 2017-04-29 DIAGNOSIS — F909 Attention-deficit hyperactivity disorder, unspecified type: Secondary | ICD-10-CM | POA: Insufficient documentation

## 2017-04-29 DIAGNOSIS — E039 Hypothyroidism, unspecified: Secondary | ICD-10-CM

## 2017-04-29 DIAGNOSIS — G2581 Restless legs syndrome: Secondary | ICD-10-CM | POA: Diagnosis not present

## 2017-04-29 DIAGNOSIS — I110 Hypertensive heart disease with heart failure: Secondary | ICD-10-CM | POA: Diagnosis not present

## 2017-04-29 DIAGNOSIS — I5022 Chronic systolic (congestive) heart failure: Secondary | ICD-10-CM | POA: Insufficient documentation

## 2017-04-29 DIAGNOSIS — I2510 Atherosclerotic heart disease of native coronary artery without angina pectoris: Secondary | ICD-10-CM | POA: Diagnosis not present

## 2017-04-29 DIAGNOSIS — F329 Major depressive disorder, single episode, unspecified: Secondary | ICD-10-CM

## 2017-04-29 DIAGNOSIS — I10 Essential (primary) hypertension: Secondary | ICD-10-CM

## 2017-04-29 DIAGNOSIS — Z87891 Personal history of nicotine dependence: Secondary | ICD-10-CM | POA: Insufficient documentation

## 2017-04-29 DIAGNOSIS — K5792 Diverticulitis of intestine, part unspecified, without perforation or abscess without bleeding: Secondary | ICD-10-CM | POA: Diagnosis not present

## 2017-04-29 DIAGNOSIS — F319 Bipolar disorder, unspecified: Secondary | ICD-10-CM | POA: Insufficient documentation

## 2017-04-29 DIAGNOSIS — G4733 Obstructive sleep apnea (adult) (pediatric): Secondary | ICD-10-CM | POA: Diagnosis not present

## 2017-04-29 DIAGNOSIS — F1290 Cannabis use, unspecified, uncomplicated: Secondary | ICD-10-CM | POA: Diagnosis not present

## 2017-04-29 DIAGNOSIS — K7460 Unspecified cirrhosis of liver: Secondary | ICD-10-CM | POA: Insufficient documentation

## 2017-04-29 DIAGNOSIS — E119 Type 2 diabetes mellitus without complications: Secondary | ICD-10-CM | POA: Insufficient documentation

## 2017-04-29 DIAGNOSIS — K589 Irritable bowel syndrome without diarrhea: Secondary | ICD-10-CM | POA: Insufficient documentation

## 2017-04-29 MED ORDER — SACUBITRIL-VALSARTAN 49-51 MG PO TABS: 1.0000 | ORAL_TABLET | Freq: Two times a day (BID) | ORAL | 5 refills | Status: DC

## 2017-04-29 NOTE — Patient Instructions (Signed)
Continue weighing daily and call for an overnight weight gain of > 2 pounds or a weekly weight gain of >5 pounds.  Finish current dose of entresto and then begin the next higher dose of 49/51mg  twice daily.

## 2017-04-29 NOTE — Progress Notes (Addendum)
Agree with pharmacist note below.  Physical exam and ROS done by myself.  Weight up 10 pounds from the last time she was here. Says that she's eating more and tends to eat after 7pm at night. Does walk every other day ~ 1/2 mile. Admits to not getting any other exercise. Discussed increasing her walking to at least daily and discussed how this extra weight can put added stress on the heart. In the last 6 months, she has gained 37+ pounds.  Will increase her entresto to 49/51mg  BID. She can finish out her current entresto dose and then begin the 49/51mg  dose. Will get a BMP at her next visit.  Return in 1 month or sooner for any questions/problems before then.  Bonnie Kindred, FNP HF Clinic at [facility]    Patient ID: Bonnie Ochoa, female    DOB: 10/27/1965, 51 y.o.   MRN: [id number]  HPI  Bonnie Ochoa is a 51 y/o female with a history of obstructive sleep apnea, restless leg syndrome, PTSD, HTN, DM, depression, CAD, cirrhosis, bipolar, asthma, marijuana use and chronic heart failure.   Last echo was done 05/28/16 and showed an EF of 20-25% along with moderate MR/TR and severely elevated PA pressure of 64 mm Hg.    In ED 12/08/16 with vomiting. CT was negative and she was discharged home. In the ED 12/07/16 due to dizziness with chest pain and hypotension. Given IV fluids, treated and discharged home.  Admitted 09/17/16 with HF exacerbation. Initially treated with IV diuretics and transitioned to oral diuretics. 120 cc of fluid removed from a paracentesis. Discharged home the following day. Admitted 09/10/16 with HF exacerbation along with abdominal pain. Abdominal CT showed worsening ascites and she had a paracentesis with fluid removal with improvement of her symptoms. Discharged home the next day. ED visit 07/31/16 due to viral syndrome. Evaluated and discharged home.   She presents today for her follow-up visit with a chief complaint of mild fatigue upon moderate exertion and daily chest pain. She  describes the fatigue as being chronic in nature having been present for several years with varying levels of severity. She has associated light-headedness, chest tightness, depression and weight gain along with this.   Past Medical History:  Diagnosis Date  . ADHD   . Arthritis   . Asthma   . Bipolar 1 disorder (HCC)   . CHF (congestive heart failure) (HCC)   . Cirrhosis of liver (HCC)   . Coronary artery disease   . Depression   . Diabetes mellitus without complication (HCC)   . Diverticulitis   . Hypertension   . IBS (irritable bowel syndrome)   . Insomnia   . Migraines   . PTSD (post-traumatic stress disorder)   . Restless leg syndrome   . Sleep apnea   . Vertigo    Past Surgical History:  Procedure Laterality Date  . ABDOMINAL HYSTERECTOMY    . debribalator  2016  . INSERT / REPLACE / REMOVE PACEMAKER    . INSERTION OF ICD    . TONSILECTOMY, ADENOIDECTOMY, BILATERAL MYRINGOTOMY AND TUBES    . TONSILLECTOMY    . TONSILLECTOMY    . TUBAL LIGATION  1980  . TUBAL LIGATION     Family History  Problem Relation Age of Onset  . Hypertension Mother   . Hyperlipidemia Mother   . Heart disease Mother   . Hypertension Sister   . Asthma Sister   . Heart disease Sister   . Diabetes Sister   .  Cancer Sister   . Alzheimer's disease Maternal Grandfather   . Hyperlipidemia Brother   . Asthma Sister   . Hypertension Sister   . Diabetes Sister    Social History  Substance Use Topics  . Smoking status: Former Games developer  . Smokeless tobacco: Never Used  . Alcohol use No   No Known Allergies  Prior to Admission medications   Medication Sig Start Date End Date Taking? Authorizing Provider  aspirin EC 81 MG tablet Take 1 tablet (81 mg total) by mouth daily. 06/04/16  Yes Enid Baas, MD  atorvastatin (LIPITOR) 40 MG tablet Take 1 tablet (40 mg total) by mouth daily. 08/21/16  Yes Antonieta Iba, MD  carvedilol (COREG) 6.25 MG tablet Take 1 tablet (6.25 mg total) by  mouth 2 (two) times daily with a meal. 08/21/16  Yes Gollan, Tollie Pizza, MD  dicyclomine (BENTYL) 20 MG tablet Take 1 tablet (20 mg total) by mouth 3 (three) times daily as needed for spasms. 12/28/16  Yes Johnson, Megan P, DO  furosemide (LASIX) 40 MG tablet Take 60 mg by mouth 2 (two) times daily.   Yes [provider]  LORazepam (ATIVAN) 0.5 MG tablet Take 0.5 mg by mouth every 8 (eight) hours as needed for anxiety.   Yes [provider]  metolazone (ZAROXOLYN) 5 MG tablet Take 1 tablet (5 mg total) by mouth daily as needed. 12/31/16 03/31/17 Yes Gollan, Tollie Pizza, MD  nitroGLYCERIN (NITROSTAT) 0.4 MG SL tablet Place 1 tablet (0.4 mg total) under the tongue every 5 (five) minutes as needed for chest pain. 06/04/16  Yes Enid Baas, MD  ondansetron (ZOFRAN ODT) 4 MG disintegrating tablet Take 1 tablet (4 mg total) by mouth every 8 (eight) hours as needed for nausea or vomiting. 12/07/16  Yes Emily Filbert, MD  pantoprazole (PROTONIX) 40 MG tablet TAKE 1 TABLET (40 MG TOTAL) BY MOUTH DAILY. 01/14/17 01/14/18 Yes Midge Minium, MD  PARoxetine (PAXIL) 30 MG tablet Take 1 tablet (30 mg total) by mouth daily. 12/28/16  Yes Johnson, Megan P, DO  potassium chloride (K-DUR,KLOR-CON) 10 MEQ tablet Take 20 mEq by mouth daily.    Yes [provider]  prazosin (MINIPRESS) 1 MG capsule Take 1 mg by mouth at bedtime.   Yes [provider]  QUEtiapine (SEROQUEL) 300 MG tablet Take 1 tablet (300 mg total) by mouth at bedtime. 12/28/16  Yes Johnson, Megan P, DO  sacubitril-valsartan (ENTRESTO) 24-26 MG Take 1 tablet by mouth 2 (two) times daily. 02/25/17  Yes Bonnie Ochoa A, FNP  spironolactone (ALDACTONE) 25 MG tablet Take 1 tablet (25 mg total) by mouth daily. 08/21/16  Yes Antonieta Iba, MD   Review of Systems  Constitutional: Positive for fatigue. Negative for appetite change.  HENT: Negative for congestion, nosebleeds and sore throat.   Eyes: Negative.   Respiratory:  Positive for chest tightness. Negative for shortness of breath.   Cardiovascular: Positive for chest pain. Negative for palpitations and leg swelling.  Gastrointestinal: Negative for abdominal distention and abdominal pain.  Endocrine: Negative.   Genitourinary: Negative.   Musculoskeletal: Negative for arthralgias and neck pain.  Skin: Negative.   Allergic/Immunologic: Negative.   Neurological: Positive for light-headedness. Negative for dizziness.  Hematological: Negative for adenopathy. Does not bruise/bleed easily.  Psychiatric/Behavioral: Positive for dysphoric mood. Negative for sleep disturbance (sleeping on 2 pillows) and suicidal ideas. The patient is nervous/anxious.    Vitals:   04/29/17 1419  BP: 108/67  Pulse: 70  Resp: 18  SpO2: 98%  Weight: 199 lb 2 oz (90.3 kg)  Height:  (1.651 m)   Wt Readings from Last 3 Encounters:  04/29/17 199 lb 2 oz (90.3 kg)  03/11/17 189 lb 8 oz (86 kg)  02/06/17 183 lb (83 kg)    Lab Results  Component Value Date   CREATININE 1.91 (H) 02/20/2017   CREATININE 1.47 (H) 02/01/2017   CREATININE 1.28 (H) 12/28/2016    Physical Exam  Constitutional: She is oriented to person, place, and time. She appears well-developed and well-nourished.  HENT:  Head: Normocephalic and atraumatic.  Neck: Normal range of motion. Neck supple. No JVD present.  Cardiovascular: Normal rate and regular rhythm.   Pulmonary/Chest: Effort normal. She has no wheezes. She has no rales.  Abdominal: Soft. She exhibits no distension. There is no tenderness.  Musculoskeletal: She exhibits no edema or tenderness.  Neurological: She is alert and oriented to person, place, and time.  Skin: Skin is warm and dry.  Psychiatric: She has a normal mood and affect. Her behavior is normal. Thought content normal.  Nursing note and vitals reviewed.   Assessment & Plan:  1: Chronic heart failure with reduced ejection fraction- - NYHA class II - euvolemic - Trying  to weigh daily-sometimes forgets; wife says she writes down her daily weights. She's to call for an overnight weight gain of >2 pounds or a weekly weight gain of >5 pounds.  - not adding salt to her food and is trying to eat low sodium foods. Continues to read food labels so that she can keep her sodium intake to 2000mg  daily. - Has been trying to drink around 5-6 cups (8oz) of water a day. Discussed the importance of maintaining her fluid intake between 40-48 ounces of fluid daily.  - saw cardiologist Mariah Milling) 11/19/16; has follow up appointment with cardiologist 05/31/17  2: HTN- - BP looks good today - saw PCP Laural Benes) 02/28/17  3: Depression- - does have a history of PTSD - Continue current medications   4. Thyroid - Was started on Synthroid about 1 week ago - Followed by endocrinologist   Patient did not bring her medications nor a list. Each medication was verbally reviewed with the patient and she was encouraged to bring the bottles to every visit to confirm accuracy of list.  Return here in 1 month or sooner for any questions/problems before then.   Cleopatra Cedar, PharmD Pharmacy Resident  04/29/17

## 2017-04-30 DIAGNOSIS — E039 Hypothyroidism, unspecified: Secondary | ICD-10-CM | POA: Insufficient documentation

## 2017-05-02 ENCOUNTER — Ambulatory Visit (INDEPENDENT_AMBULATORY_CARE_PROVIDER_SITE_OTHER): Payer: Medicare Other

## 2017-05-02 VITALS — Temp 98.5°F

## 2017-05-02 DIAGNOSIS — Z23 Encounter for immunization: Secondary | ICD-10-CM

## 2017-05-02 NOTE — Patient Instructions (Addendum)

## 2017-05-27 ENCOUNTER — Ambulatory Visit (INDEPENDENT_AMBULATORY_CARE_PROVIDER_SITE_OTHER): Payer: Medicare Other | Admitting: *Deleted

## 2017-05-27 ENCOUNTER — Ambulatory Visit: Payer: Medicare Other | Admitting: Family

## 2017-05-27 ENCOUNTER — Telehealth: Payer: Self-pay

## 2017-05-27 DIAGNOSIS — I5022 Chronic systolic (congestive) heart failure: Secondary | ICD-10-CM | POA: Diagnosis not present

## 2017-05-27 DIAGNOSIS — Z95810 Presence of automatic (implantable) cardiac defibrillator: Secondary | ICD-10-CM

## 2017-05-27 DIAGNOSIS — I428 Other cardiomyopathies: Secondary | ICD-10-CM

## 2017-05-27 NOTE — Progress Notes (Signed)
EPIC Encounter for ICM Monitoring  Patient Name: Bonnie Ochoa is a 51 y.o. female Date: 05/27/2017 Primary Care Physican: Valerie Roys, DO Primary Cardiologist: Sanjuana Letters, NP Electrophysiologist: Faustino Congress Weight:Last office weight 166 lbs                                               Attempted call to patient and unable to reach.   Transmission reviewed.    Thoracic impedance normal but was abnormal from 04/24/2017 to 05/08/2017.  Prescribed dosage: Furosemide 40 mg 1.5 tablets (60 mg total) by mouth 2 (two) times daily. Potassium 10 mEq 2tablet (20 mEq total) daily.   Labs: 02/20/2017 Creatinine 1.91, BUN 61, Potassium 4.5, Sodium 134, EGFR 29-34 02/01/2017 Creatinine 1.47, BUN 25, Potassium 4.6, Sodium 138, EGFR 40-47 12/28/2016 Creatinine 1.28, BUN 34, Potassium 5.2, Sodium 139, EGFR 48-55 12/08/2016 Creatinine 1.15, BUN 30, Potassium 3.9, Sodium 138, EGFR 54->60 12/07/2016 Creatinine 1.14, BUN 32, Potassium 4.2, Sodium 135, EGFR 52->60 12/04/2016 Creatinine 1.18, BUN 30, Potassium 3.9, Sodium 137, EGFR 54->60 11/05/2016 Creatinine 0.81, BUN 22, Potassium 3.9, Sodium 136, EGFR >60 09/18/2016 Creatinine 1.34, BUN 28, Potassium 3.3, Sodium 138, EGFR 45-52  09/17/2016 Creatinine 1.20, BUN 24, Potassium 3.4, Sodium 138, EGFR 51-59  09/11/2016 Creatinine 1.14, BUN 22, Potassium 3.3, Sodium 138, EGFR 54->60  01/28/2018Creatinine 1.13, BUN 24, Potassium 3.6, Sodium 136, EGFR 55->60   Recommendations: NONE - Unable to reach patient   Follow-up plan: ICM clinic phone appointment on 06/27/2017.  Office appointment scheduled 05/31/2017 with Dr. Rockey Situ.  Copy of ICM check sent to Dr. Caryl Comes.   3 month ICM trend: 05/27/2017   1 Year ICM trend:      Rosalene Billings, RN 05/27/2017 3:37 PM

## 2017-05-27 NOTE — Telephone Encounter (Signed)
Remote ICM transmission received.  Attempted call to patient and left message to return call. 

## 2017-05-28 NOTE — Progress Notes (Signed)
Remote ICD transmission.   

## 2017-05-30 LAB — CUP PACEART REMOTE DEVICE CHECK
Battery Remaining Longevity: 88 mo
Battery Remaining Percentage: 79 %
Battery Voltage: 3.04 V
Brady Statistic RV Percent Paced: 1 %
HIGH POWER IMPEDANCE MEASURED VALUE: 74 Ohm
HIGH POWER IMPEDANCE MEASURED VALUE: 74 Ohm
Implantable Lead Implant Date: 20160410
Lead Channel Impedance Value: 450 Ohm
Lead Channel Pacing Threshold Amplitude: 1 V
Lead Channel Sensing Intrinsic Amplitude: 11.8 mV
Lead Channel Setting Pacing Amplitude: 2.5 V
MDC IDC LEAD LOCATION: 753860
MDC IDC MSMT LEADCHNL RV PACING THRESHOLD PULSEWIDTH: 0.5 ms
MDC IDC PG IMPLANT DT: 20160410
MDC IDC PG SERIAL: 7259964
MDC IDC SESS DTM: 20181015065333
MDC IDC SET LEADCHNL RV PACING PULSEWIDTH: 0.5 ms
MDC IDC SET LEADCHNL RV SENSING SENSITIVITY: 0.5 mV

## 2017-05-30 NOTE — Progress Notes (Deleted)
Cardiology Office Note  Date:  05/30/2017   ID:  Bonnie Ochoa, DOB 12/25/1965, MRN [id number]  PCP:  Dorcas CarrowJohnson, Megan P, DO   No chief complaint on file.   HPI:  Bonnie Ochoa is a pleasant 51 year old woman arrived from [place] In 2017 with notes in the computer detailing history of  Medical and appt noncompliance, Substance abuse-chronic /marijuana bipolar,   anxiety diabetes,  hypertension,  asthma,  Obstructive sleep apnea, restless leg syndrome Nonischemic cardiomyopathy,  history of ICD,  ejection fraction 25% anemia Previous alcohol problem (patient denies any coronary disease on prior cardiac catheterization)  ascites, cirrhosis, History of paracentesis Previously on pain medication at home, oxycodone She presents today for follow-up of her chronic systolic CHF  Reports on arrival having diffuse arm pain, back pain Weight home 157, She feels baseline weight 153 at home Here 166. She feels extra weight is from her shoes Denies any leg swelling, shortness of breath when climbing stairs  Hospital admission 09/10/16 numerous sx on arrival to ER chest pain, shortness of breath, abdominal pain, dysuria, urinary frequency, syncope 2 today with lightheadedness, nausea and vomiting, and diarrhea , sob TBili 2.4, had paracentesis, diuresis D/c on lasix 60 BID  Hospital admission 09/17/16: chest pain, ABD pain fevers, chills, chest pain, shortness of breath, vomiting and diarrhea. "ran out of meds" HTN, acute on chronic systolic CHF  Scheduled to see primary care Her normal caretaker/Family member/partner not with her today, different family member resents with her who does not do her medications  Other past medical history reviewed Seen in the emergency room 05/19/2016 for abdominal pain, chest pain BNP in the hospital 3700  acute on chronic CHF, Had 20 L diuresis  Since her last clinic visit she has been to the emergency room in December 2017 for nausea, vomiting, URI  symptoms  CT scan consistent with moderate abdominal ascites, fatty liver Aortic atherosclerosis Used to drink alcohol, no longer drinks   PMH:   has a past medical history of ADHD; Arthritis; Asthma; Bipolar 1 disorder (HCC); CHF (congestive heart failure) (HCC); Cirrhosis of liver (HCC); Coronary artery disease; Depression; Diabetes mellitus without complication (HCC); Diverticulitis; Hypertension; IBS (irritable bowel syndrome); Insomnia; Migraines; PTSD (post-traumatic stress disorder); Restless leg syndrome; Sleep apnea; and Vertigo.  PSH:    Past Surgical History:  Procedure Laterality Date  . ABDOMINAL HYSTERECTOMY    . debribalator  2016  . INSERT / REPLACE / REMOVE PACEMAKER    . INSERTION OF ICD    . TONSILECTOMY, ADENOIDECTOMY, BILATERAL MYRINGOTOMY AND TUBES    . TONSILLECTOMY    . TONSILLECTOMY    . TUBAL LIGATION  1980  . TUBAL LIGATION      Current Outpatient Prescriptions  Medication Sig Dispense Refill  . aspirin EC 81 MG tablet Take 1 tablet (81 mg total) by mouth daily. (Patient not taking: Reported on 04/29/2017) 30 tablet 2  . atorvastatin (LIPITOR) 40 MG tablet Take 1 tablet (40 mg total) by mouth daily. 90 tablet 3  . carvedilol (COREG) 6.25 MG tablet Take 1 tablet (6.25 mg total) by mouth 2 (two) times daily with a meal. 180 tablet 3  . dicyclomine (BENTYL) 20 MG tablet TAKE 1 TABLET (20 MG TOTAL) BY MOUTH 3 (THREE) TIMES DAILY AS NEEDED FOR SPASMS. 90 tablet 3  . furosemide (LASIX) 40 MG tablet Take 60 mg by mouth 2 (two) times daily.    Marland Kitchen. levothyroxine (SYNTHROID, LEVOTHROID) 50 MCG tablet Take 1 tablet by mouth daily.  1  .  LORazepam (ATIVAN) 0.5 MG tablet Take 0.5 mg by mouth every 8 (eight) hours as needed for anxiety.    . nitroGLYCERIN (NITROSTAT) 0.4 MG SL tablet Place 1 tablet (0.4 mg total) under the tongue every 5 (five) minutes as needed for chest pain. 30 tablet 12  . ondansetron (ZOFRAN ODT) 4 MG disintegrating tablet Take 1 tablet (4 mg total)  by mouth every 8 (eight) hours as needed for nausea or vomiting. 20 tablet 0  . pantoprazole (PROTONIX) 40 MG tablet TAKE 1 TABLET (40 MG TOTAL) BY MOUTH DAILY. 30 tablet 3  . PARoxetine (PAXIL) 30 MG tablet TAKE 1 TABLET BY MOUTH EVERY DAY 90 tablet 0  . potassium chloride (K-DUR,KLOR-CON) 10 MEQ tablet Take 20 mEq by mouth daily.     . prazosin (MINIPRESS) 1 MG capsule Take 1 mg by mouth at bedtime.    Marland Kitchen QUEtiapine (SEROQUEL) 300 MG tablet TAKE 1 TABLET (300 MG TOTAL) BY MOUTH AT BEDTIME. 90 tablet 0  . sacubitril-valsartan (ENTRESTO) 49-51 MG Take 1 tablet by mouth 2 (two) times daily. 60 tablet 5  . spironolactone (ALDACTONE) 25 MG tablet Take 1 tablet (25 mg total) by mouth daily. 90 tablet 3   No current facility-administered medications for this visit.      Allergies:   Patient has no known allergies.   Social History:  The patient  reports that she has quit smoking. She has never used smokeless tobacco. She reports that she uses drugs, including Marijuana. She reports that she does not drink alcohol.   Family History:   family history includes Alzheimer's disease in her maternal grandfather; Asthma in her sister and sister; Cancer in her sister; Diabetes in her sister and sister; Heart disease in her mother and sister; Hyperlipidemia in her brother and mother; Hypertension in her mother, sister, and sister.    Review of Systems: Review of Systems  Constitutional: Negative.   Respiratory: Positive for shortness of breath.   Cardiovascular: Negative.   Gastrointestinal: Negative.   Musculoskeletal: Negative.        Arm pain, back pain  Neurological: Negative.   Psychiatric/Behavioral: Negative.   All other systems reviewed and are negative.    PHYSICAL EXAM: VS:  There were no vitals taken for this visit. , BMI There is no height or weight on file to calculate BMI. GEN: Well nourished, well developed, in no acute distress  HEENT: normal  Neck: no JVD, carotid bruits, or  masses Cardiac: RRR; no murmurs, rubs, or gallops,no edema  Respiratory:  clear to auscultation bilaterally, normal work of breathing GI: soft, nontender, nondistended, + BS MS: no deformity or atrophy  Skin: warm and dry, no rash Neuro:  Strength and sensation are intact Psych: euthymic mood, full affect    Recent Labs: 09/17/2016: B Natriuretic Peptide >4,500.0 09/18/2016: Magnesium 2.0 12/28/2016: ALT 22; Hemoglobin 11.2; Platelets 238 02/01/2017: TSH 10.120 02/20/2017: BUN 61; Creatinine, Ser 1.91; Potassium 4.5; Sodium 134    Lipid Panel Lab Results  Component Value Date   CHOL 179 12/28/2016   HDL 50 12/28/2016   LDLCALC 103 (H) 12/28/2016   TRIG 132 12/28/2016      Wt Readings from Last 3 Encounters:  04/29/17 199 lb 2 oz (90.3 kg)  03/11/17 189 lb 8 oz (86 kg)  02/06/17 183 lb (83 kg)       ASSESSMENT AND PLAN:  Chronic systolic heart failure (HCC) Weight up dramatically on office visit today but she reports weight is 157 at home  Goal weight typically 153 pounds Unclear why it is 166 on today's visit in the office Stressed the importance of compliance with Lasix 60 mg twice a day Given she is in and out of the emergency room with CHF exacerbation, We have recommended she take metolazone 2.5 up to 5 mg sparingly for weight more than 160 pounds on her scale at home  Essential hypertension Blood pressure is low, unable to titrate entresto We will continue current medications  Pulmonary hypertension Recommended she call our office for worsening shortness of breath, weight gain not relieved with Lasix and metolazone  NICM (nonischemic cardiomyopathy) (HCC) Recommended alcohol cessation, avoid drugs of abuse We'll continue entresto, Aldactone, beta blocker  Obstructive sleep apnea  Generalized abdominal pain  Diabetes mellitus without complication (HCC) We have encouraged continued exercise, careful diet management in an effort to lose weight.  Bipolar 1  disorder (HCC)  Other ascites  Alcoholic cirrhosis of liver with ascites (HCC)  Noncompliance Stressed importance of compliance of her medications Typically will present with family member to her visits, family tends to do her medications  Disposition:   F/U  6 months  No orders of the defined types were placed in this encounter.   Total encounter time more than 25 minutes  Greater than 50% was spent in counseling and coordination of care with the patient   Signed, Dossie Arbour, M.D., Ph.D. 05/30/2017  [facility] [place], [place] [phone]

## 2017-05-31 ENCOUNTER — Encounter: Payer: Self-pay | Admitting: Cardiology

## 2017-05-31 ENCOUNTER — Ambulatory Visit: Payer: Medicare Other | Admitting: Cardiovascular Disease

## 2017-06-03 ENCOUNTER — Encounter: Payer: Self-pay | Admitting: Cardiovascular Disease

## 2017-06-16 ENCOUNTER — Other Ambulatory Visit: Payer: Self-pay | Admitting: Family Medicine

## 2017-06-16 ENCOUNTER — Other Ambulatory Visit: Payer: Self-pay | Admitting: Cardiovascular Disease

## 2017-06-27 ENCOUNTER — Ambulatory Visit (INDEPENDENT_AMBULATORY_CARE_PROVIDER_SITE_OTHER): Payer: Medicare Other

## 2017-06-27 DIAGNOSIS — I5022 Chronic systolic (congestive) heart failure: Secondary | ICD-10-CM | POA: Diagnosis not present

## 2017-06-27 DIAGNOSIS — Z95810 Presence of automatic (implantable) cardiac defibrillator: Secondary | ICD-10-CM

## 2017-06-28 NOTE — Progress Notes (Signed)
EPIC Encounter for ICM Monitoring  Patient Name: Bonnie Ochoa is a 51 y.o. female Date: 06/28/2017 Primary Care Physican: Valerie Roys, DO Primary Cardiologist: Sanjuana Letters, NP Electrophysiologist: Faustino Congress Weight:Last office weight 166 lbs      Attempted call to patient and unable to reach.  Left detailed message regarding transmission.  Transmission reviewed.    Thoracic impedance normal.  Prescribed dosage: Furosemide 40 mg 1.5 tablets (60 mg total) by mouth 2 (two) times daily. Potassium 20 mEq take 1 tablet daily.   Labs: 02/20/2017 Creatinine 1.91, BUN 61, Potassium 4.5, Sodium 134, EGFR 29-34 02/01/2017 Creatinine 1.47, BUN 25, Potassium 4.6, Sodium 138, EGFR 40-47 12/28/2016 Creatinine 1.28, BUN 34, Potassium 5.2, Sodium 139, EGFR 48-55 12/08/2016 Creatinine 1.15, BUN 30, Potassium 3.9, Sodium 138, EGFR 54->60 12/07/2016 Creatinine 1.14, BUN 32, Potassium 4.2, Sodium 135, EGFR 52->60 12/04/2016 Creatinine 1.18, BUN 30, Potassium 3.9, Sodium 137, EGFR 54->60 11/05/2016 Creatinine 0.81, BUN 22, Potassium 3.9, Sodium 136, EGFR >60 09/18/2016 Creatinine 1.34, BUN 28, Potassium 3.3, Sodium 138, EGFR 45-52  09/17/2016 Creatinine 1.20, BUN 24, Potassium 3.4, Sodium 138, EGFR 51-59  09/11/2016 Creatinine 1.14, BUN 22, Potassium 3.3, Sodium 138, EGFR 54->60  01/28/2018Creatinine 1.13, BUN 24, Potassium 3.6, Sodium 136, EGFR 55->60   Recommendations:  Left voice mail with ICM number and encouraged to call if experiencing any fluid symptoms.  Follow-up plan: ICM clinic phone appointment on 09/02/2017.  Office appointment scheduled 08/01/2017 with Dr. Caryl Comes.  Copy of ICM check sent to Dr. Caryl Comes.   3 month ICM trend: 06/27/2017    1 Year ICM trend:       Rosalene Billings, RN 06/28/2017 12:26 PM

## 2017-07-02 ENCOUNTER — Inpatient Hospital Stay
Admission: EM | Admit: 2017-07-02 | Discharge: 2017-07-05 | DRG: 637 | Disposition: A | Discharge status: Home / Self Care | Payer: Medicare Other | Attending: Internal Medicine | Admitting: Internal Medicine

## 2017-07-02 ENCOUNTER — Emergency Department: Payer: Medicare Other

## 2017-07-02 ENCOUNTER — Encounter: Payer: Self-pay | Admitting: Emergency Medicine

## 2017-07-02 DIAGNOSIS — E871 Hypo-osmolality and hyponatremia: Secondary | ICD-10-CM | POA: Diagnosis not present

## 2017-07-02 DIAGNOSIS — E1100 Type 2 diabetes mellitus with hyperosmolarity without nonketotic hyperglycemic-hyperosmolar coma (NKHHC): Principal | ICD-10-CM

## 2017-07-02 DIAGNOSIS — M1990 Unspecified osteoarthritis, unspecified site: Secondary | ICD-10-CM | POA: Diagnosis present

## 2017-07-02 DIAGNOSIS — F319 Bipolar disorder, unspecified: Secondary | ICD-10-CM | POA: Diagnosis present

## 2017-07-02 DIAGNOSIS — I130 Hypertensive heart and chronic kidney disease with heart failure and stage 1 through stage 4 chronic kidney disease, or unspecified chronic kidney disease: Secondary | ICD-10-CM | POA: Diagnosis not present

## 2017-07-02 DIAGNOSIS — K7460 Unspecified cirrhosis of liver: Secondary | ICD-10-CM | POA: Diagnosis present

## 2017-07-02 DIAGNOSIS — R739 Hyperglycemia, unspecified: Secondary | ICD-10-CM

## 2017-07-02 DIAGNOSIS — E860 Dehydration: Secondary | ICD-10-CM | POA: Diagnosis present

## 2017-07-02 DIAGNOSIS — Z23 Encounter for immunization: Secondary | ICD-10-CM

## 2017-07-02 DIAGNOSIS — Z87891 Personal history of nicotine dependence: Secondary | ICD-10-CM

## 2017-07-02 DIAGNOSIS — N179 Acute kidney failure, unspecified: Secondary | ICD-10-CM | POA: Diagnosis not present

## 2017-07-02 DIAGNOSIS — R7989 Other specified abnormal findings of blood chemistry: Secondary | ICD-10-CM | POA: Diagnosis not present

## 2017-07-02 DIAGNOSIS — Z794 Long term (current) use of insulin: Secondary | ICD-10-CM

## 2017-07-02 DIAGNOSIS — N170 Acute kidney failure with tubular necrosis: Secondary | ICD-10-CM | POA: Diagnosis present

## 2017-07-02 DIAGNOSIS — F4310 Post-traumatic stress disorder, unspecified: Secondary | ICD-10-CM | POA: Diagnosis present

## 2017-07-02 DIAGNOSIS — Z9119 Patient's noncompliance with other medical treatment and regimen: Secondary | ICD-10-CM

## 2017-07-02 DIAGNOSIS — R778 Other specified abnormalities of plasma proteins: Secondary | ICD-10-CM

## 2017-07-02 DIAGNOSIS — G4700 Insomnia, unspecified: Secondary | ICD-10-CM | POA: Diagnosis present

## 2017-07-02 DIAGNOSIS — E1122 Type 2 diabetes mellitus with diabetic chronic kidney disease: Secondary | ICD-10-CM | POA: Diagnosis present

## 2017-07-02 DIAGNOSIS — E1101 Type 2 diabetes mellitus with hyperosmolarity with coma: Secondary | ICD-10-CM | POA: Diagnosis present

## 2017-07-02 DIAGNOSIS — F909 Attention-deficit hyperactivity disorder, unspecified type: Secondary | ICD-10-CM | POA: Diagnosis present

## 2017-07-02 DIAGNOSIS — Z95810 Presence of automatic (implantable) cardiac defibrillator: Secondary | ICD-10-CM

## 2017-07-02 DIAGNOSIS — R42 Dizziness and giddiness: Secondary | ICD-10-CM | POA: Diagnosis not present

## 2017-07-02 DIAGNOSIS — R079 Chest pain, unspecified: Secondary | ICD-10-CM | POA: Diagnosis not present

## 2017-07-02 DIAGNOSIS — R1011 Right upper quadrant pain: Secondary | ICD-10-CM | POA: Diagnosis not present

## 2017-07-02 DIAGNOSIS — I5022 Chronic systolic (congestive) heart failure: Secondary | ICD-10-CM | POA: Diagnosis present

## 2017-07-02 DIAGNOSIS — Z79899 Other long term (current) drug therapy: Secondary | ICD-10-CM

## 2017-07-02 DIAGNOSIS — E1165 Type 2 diabetes mellitus with hyperglycemia: Secondary | ICD-10-CM | POA: Diagnosis not present

## 2017-07-02 DIAGNOSIS — I428 Other cardiomyopathies: Secondary | ICD-10-CM | POA: Diagnosis present

## 2017-07-02 DIAGNOSIS — Z9114 Patient's other noncompliance with medication regimen: Secondary | ICD-10-CM

## 2017-07-02 DIAGNOSIS — Z9111 Patient's noncompliance with dietary regimen: Secondary | ICD-10-CM

## 2017-07-02 DIAGNOSIS — G4733 Obstructive sleep apnea (adult) (pediatric): Secondary | ICD-10-CM | POA: Diagnosis present

## 2017-07-02 DIAGNOSIS — E876 Hypokalemia: Secondary | ICD-10-CM | POA: Diagnosis not present

## 2017-07-02 DIAGNOSIS — D631 Anemia in chronic kidney disease: Secondary | ICD-10-CM | POA: Diagnosis present

## 2017-07-02 DIAGNOSIS — I959 Hypotension, unspecified: Secondary | ICD-10-CM | POA: Diagnosis present

## 2017-07-02 DIAGNOSIS — I2510 Atherosclerotic heart disease of native coronary artery without angina pectoris: Secondary | ICD-10-CM | POA: Diagnosis present

## 2017-07-02 DIAGNOSIS — J45909 Unspecified asthma, uncomplicated: Secondary | ICD-10-CM | POA: Diagnosis present

## 2017-07-02 DIAGNOSIS — I272 Pulmonary hypertension, unspecified: Secondary | ICD-10-CM | POA: Diagnosis present

## 2017-07-02 DIAGNOSIS — K589 Irritable bowel syndrome without diarrhea: Secondary | ICD-10-CM | POA: Diagnosis present

## 2017-07-02 DIAGNOSIS — N184 Chronic kidney disease, stage 4 (severe): Secondary | ICD-10-CM | POA: Diagnosis present

## 2017-07-02 DIAGNOSIS — R188 Other ascites: Secondary | ICD-10-CM | POA: Diagnosis present

## 2017-07-02 DIAGNOSIS — G2581 Restless legs syndrome: Secondary | ICD-10-CM | POA: Diagnosis present

## 2017-07-02 LAB — URINALYSIS, COMPLETE (UACMP) WITH MICROSCOPIC
BACTERIA UA: NONE SEEN
BILIRUBIN URINE: NEGATIVE
Ketones, ur: NEGATIVE mg/dL
LEUKOCYTES UA: NEGATIVE
NITRITE: NEGATIVE
PH: 5 (ref 5.0–8.0)
Protein, ur: 30 mg/dL — AB
SPECIFIC GRAVITY, URINE: 1.014 (ref 1.005–1.030)

## 2017-07-02 LAB — CBC
HEMATOCRIT: 37.6 % (ref 35.0–47.0)
HEMOGLOBIN: 12.9 g/dL (ref 12.0–16.0)
MCH: 30.9 pg (ref 26.0–34.0)
MCHC: 34.2 g/dL (ref 32.0–36.0)
MCV: 90.2 fL (ref 80.0–100.0)
Platelets: 232 10*3/uL (ref 150–440)
RBC: 4.17 MIL/uL (ref 3.80–5.20)
RDW: 13 % (ref 11.5–14.5)
WBC: 6 10*3/uL (ref 3.6–11.0)

## 2017-07-02 LAB — GLUCOSE, CAPILLARY
GLUCOSE-CAPILLARY: 581 mg/dL — AB (ref 65–99)
GLUCOSE-CAPILLARY: 530 mg/dL — AB (ref 65–99)

## 2017-07-02 LAB — COMPREHENSIVE METABOLIC PANEL
ALK PHOS: 157 U/L — AB (ref 38–126)
ALT: 17 U/L (ref 14–54)
ANION GAP: 20 — AB (ref 5–15)
AST: 18 U/L (ref 15–41)
Albumin: 4.9 g/dL (ref 3.5–5.0)
BILIRUBIN TOTAL: 1.3 mg/dL — AB (ref 0.3–1.2)
BUN: 114 mg/dL — AB (ref 6–20)
CALCIUM: 10.1 mg/dL (ref 8.9–10.3)
CO2: 24 mmol/L (ref 22–32)
Chloride: 76 mmol/L — ABNORMAL LOW (ref 101–111)
Creatinine, Ser: 5.45 mg/dL — ABNORMAL HIGH (ref 0.44–1.00)
GFR calc Af Amer: 9 mL/min — ABNORMAL LOW (ref >60)
GFR, EST NON AFRICAN AMERICAN: 8 mL/min — AB (ref >60)
Glucose, Bld: 680 mg/dL (ref 65–99)
POTASSIUM: 4.3 mmol/L (ref 3.5–5.1)
Sodium: 120 mmol/L — ABNORMAL LOW (ref 135–145)
TOTAL PROTEIN: 10 g/dL — AB (ref 6.5–8.1)

## 2017-07-02 LAB — TROPONIN I: TROPONIN I: 0.04 ng/mL — AB (ref <0.03)

## 2017-07-02 LAB — BETA-HYDROXYBUTYRIC ACID: Beta-Hydroxybutyric Acid: 0.19 mmol/L (ref 0.05–0.27)

## 2017-07-02 LAB — LIPASE, BLOOD: Lipase: 162 U/L — ABNORMAL HIGH (ref 11–51)

## 2017-07-02 MED ORDER — SODIUM CHLORIDE 0.9 % IV SOLN
INTRAVENOUS | Status: DC
  Administered 2017-07-02: 5.4 [IU]/h via INTRAVENOUS
  Filled 2017-07-02 (×2): qty 1

## 2017-07-02 MED ORDER — ONDANSETRON HCL 4 MG/2ML IJ SOLN
4.0000 mg | Freq: Once | INTRAMUSCULAR | Status: AC
  Administered 2017-07-02: 4 mg via INTRAVENOUS
  Filled 2017-07-02: qty 2

## 2017-07-02 MED ORDER — SODIUM CHLORIDE 0.9 % IV SOLN
Freq: Once | INTRAVENOUS | Status: AC
  Administered 2017-07-02: 20:00:00 via INTRAVENOUS

## 2017-07-02 NOTE — ED Provider Notes (Addendum)
[facility] Emergency Department Provider Note       Time seen: ----------------------------------------- 7:31 PM on 07/02/2017 -----------------------------------------   I have reviewed the triage vital signs and the nursing notes.  HISTORY   Chief Complaint Abdominal Pain; Dizziness; and Chest Pain    HPI Bonnie Ochoa is a 51 y.o. female with a history of diabetes, cirrhosis, CHF and bipolar disorder who presents to the ED for abdominal pain for 2 weeks with left upper chest pain and intermittent dizziness.  Patient reports 2 episodes of vomiting yesterday as well as constipation.  She is also had frequent urination and frequent thirst.  She reports she been sleeping more over the past several days with worsening fatigue and now difficulty walking.  She does have a history of vertigo and has a defibrillator but it has not fired.  She has not had any recent changes in her medicines.  Past Medical History:  Diagnosis Date  . ADHD   . Arthritis   . Asthma   . Bipolar 1 disorder (HCC)   . CHF (congestive heart failure) (HCC)   . Cirrhosis of liver (HCC)   . Coronary artery disease   . Depression   . Diabetes mellitus without complication (HCC)   . Diverticulitis   . Hypertension   . IBS (irritable bowel syndrome)   . Insomnia   . Migraines   . PTSD (post-traumatic stress disorder)   . Restless leg syndrome   . Sleep apnea   . Vertigo     Patient Active Problem List   Diagnosis Date Noted  . Hypothyroid 04/30/2017  . HTN (hypertension) 03/11/2017  . Multiple thyroid nodules 01/29/2017  . Chronic systolic heart failure (HCC) 10/08/2016  . Obstructive sleep apnea 10/08/2016  . Hypertensive heart disease 09/18/2016  . Noncompliance 09/18/2016  . Cirrhosis (HCC) 09/18/2016  . Pulmonary hypertension (HCC) 05/30/2016  . Hypokalemia 05/30/2016  . NICM (nonischemic cardiomyopathy) (HCC) 05/30/2016  . Other ascites   . Benign hypertensive renal  disease   . DM (diabetes mellitus), type 2 with renal complications (HCC)   . Depression   . Bipolar 1 disorder (HCC)   . Asthma   . Generalized abdominal pain 05/07/2016    Past Surgical History:  Procedure Laterality Date  . ABDOMINAL HYSTERECTOMY    . debribalator  2016  . INSERT / REPLACE / REMOVE PACEMAKER    . INSERTION OF ICD    . TONSILECTOMY, ADENOIDECTOMY, BILATERAL MYRINGOTOMY AND TUBES    . TONSILLECTOMY    . TONSILLECTOMY    . TUBAL LIGATION  1980  . TUBAL LIGATION      Allergies Patient has no known allergies.  Social History Social History   Tobacco Use  . Smoking status: Former Games developer  . Smokeless tobacco: Never Used  Substance Use Topics  . Alcohol use: No  . Drug use: Yes    Types: Marijuana    Comment: last smoked marijuana earlier today    Review of Systems Constitutional: Negative for fever. Eyes: Negative for vision changes ENT:  Negative for congestion, sore throat.  Positive for increased thirst Cardiovascular: Positive for chest pain Respiratory: Negative for shortness of breath. Gastrointestinal: Negative for abdominal pain, positive for vomiting Genitourinary: Positive for polyuria Musculoskeletal: Negative for back pain. Skin: Negative for rash. Neurological: Negative for headaches, positive for weakness  All systems negative/normal/unremarkable except as stated in the HPI  ____________________________________________   PHYSICAL EXAM:  VITAL SIGNS: ED Triage Vitals  Enc Vitals Group  BP 07/02/17 1658 92/73     Pulse Rate 07/02/17 1658 88     Resp 07/02/17 1658 16     Temp 07/02/17 1658 (!) 97.5 F (36.4 C)     Temp Source 07/02/17 1658 Oral     SpO2 07/02/17 1658 99 %     Weight 07/02/17 1659 180 lb (81.6 kg)     Height 07/02/17 1659 5\' 5"  (1.651 m)     Head Circumference --      Peak Flow --      Pain Score 07/02/17 1658 8     Pain Loc --      Pain Edu? --      Excl. in GC? --     Constitutional: Alert and  oriented. Well appearing and in no distress. Eyes: Conjunctivae are normal. Normal extraocular movements. ENT   Head: Normocephalic and atraumatic.   Nose: No congestion/rhinnorhea.   Mouth/Throat: Mucous membranes are moist.   Neck: No stridor. Cardiovascular: Normal rate, regular rhythm. No murmurs, rubs, or gallops. Respiratory: Normal respiratory effort without tachypnea nor retractions. Breath sounds are clear and equal bilaterally. No wheezes/rales/rhonchi. Gastrointestinal: Non-focally tender, normal bowel sounds. Musculoskeletal: Nontender with normal range of motion in extremities. No lower extremity tenderness nor edema. Neurologic:  Normal speech and language. No gross focal neurologic deficits are appreciated.  Skin:  Skin is warm, dry and intact. No rash noted. Psychiatric: Mood and affect are normal. Speech and behavior are normal.  ____________________________________________  EKG: Interpreted by me.  Sinus rhythm rate 89 bpm, normal PR interval, LVH with repolarization abnormality, normal QT.  T wave abnormalities  ____________________________________________  ED COURSE:  Pertinent labs & imaging results that were available during my care of the patient were reviewed by me and considered in my medical decision making (see chart for details). Patient presents for dizziness, weakness and pain, we will assess with labs and imaging as indicated.   Procedures ____________________________________________   LABS (pertinent positives/negatives)  Labs Reviewed  LIPASE, BLOOD - Abnormal; Notable for the following components:      Result Value   Lipase 162 (*)    All other components within normal limits  COMPREHENSIVE METABOLIC PANEL - Abnormal; Notable for the following components:   Sodium 120 (*)    Chloride 76 (*)    Glucose, Bld 680 (*)    BUN 114 (*)    Creatinine, Ser 5.45 (*)    Total Protein 10.0 (*)    Alkaline Phosphatase 157 (*)    Total  Bilirubin 1.3 (*)    GFR calc non Af Amer 8 (*)    GFR calc Af Amer 9 (*)    Anion gap 20 (*)    All other components within normal limits  URINALYSIS, COMPLETE (UACMP) WITH MICROSCOPIC - Abnormal; Notable for the following components:   Color, Urine YELLOW (*)    APPearance CLEAR (*)    Glucose, UA >=500 (*)    Hgb urine dipstick SMALL (*)    Protein, ur 30 (*)    Squamous Epithelial / LPF 0-5 (*)    All other components within normal limits  TROPONIN I - Abnormal; Notable for the following components:   Troponin I 0.04 (*)    All other components within normal limits  CBC  BETA-HYDROXYBUTYRIC ACID   CRITICAL CARE Performed by: Emily FilbertWilliams, Maylie Ashton E   Total critical care time: 30 minutes  Critical care time was exclusive of separately billable procedures and treating other patients.  Critical care  was necessary to treat or prevent imminent or life-threatening deterioration.  Critical care was time spent personally by me on the following activities: development of treatment plan with patient and/or surrogate as well as nursing, discussions with consultants, evaluation of patient's response to treatment, examination of patient, obtaining history from patient or surrogate, ordering and performing treatments and interventions, ordering and review of laboratory studies, ordering and review of radiographic studies, pulse oximetry and re-evaluation of patient's condition.  RADIOLOGY  Chest x-ray is normal Right upper quadrant ultrasound Is unremarkable ____________________________________________  DIFFERENTIAL DIAGNOSIS   Dehydration, electrolyte abnormality, hyperglycemia, cholecystitis, pancreatitis, MI, dissection  FINAL ASSESSMENT AND PLAN  Acute renal failure, dehydration, hyperglycemia, elevated troponin, mild pancreatitis, hyperosmolar state   Plan: Patient had presented for general malaise and pain. Patient's labs were concerning for acute renal failure with  hyperglycemia and high anion gap likely secondary to uremia. Patient's imaging was negative including chest x-ray and right upper quadrant ultrasound.  She also has an elevated lipase which could be secondary to hyperglycemia or lipids.  We have started her on saline infusion as well as an insulin drip for her hyperglycemia.  Hopefully her creatinine will improve with fluids.  I will discuss with the hospitalist for admission.   Emily Filbert, MD   Note: This note was generated in part or whole with voice recognition software. Voice recognition is usually quite accurate but there are transcription errors that can and very often do occur. I apologize for any typographical errors that were not detected and corrected.     Emily Filbert, MD 07/02/17 2049    Emily Filbert, MD 07/02/17 2051

## 2017-07-02 NOTE — ED Triage Notes (Signed)
Patient presents to the ED with abdominal pain x 2 weeks, intermittent left upper chest pain and intermittent dizziness.  Patient reports 2 episodes of vomiting yesterday as well as constipation.  Patient has been sleeping more over the past several days and has felt very tired.  Patient reports having difficulty walking today due to dizziness.  Patient reports history of vertigo, she has a defibrillator, and she reports taking medication for hypertension.

## 2017-07-03 ENCOUNTER — Inpatient Hospital Stay: Payer: Medicare Other

## 2017-07-03 ENCOUNTER — Other Ambulatory Visit: Payer: Self-pay

## 2017-07-03 ENCOUNTER — Inpatient Hospital Stay (HOSPITAL_COMMUNITY)
Admit: 2017-07-03 | Discharge: 2017-07-03 | Disposition: A | Discharge status: Home / Self Care | Payer: Medicare Other | Attending: Pulmonary Disease | Admitting: Pulmonary Disease

## 2017-07-03 DIAGNOSIS — K7031 Alcoholic cirrhosis of liver with ascites: Secondary | ICD-10-CM | POA: Diagnosis not present

## 2017-07-03 DIAGNOSIS — R748 Abnormal levels of other serum enzymes: Secondary | ICD-10-CM

## 2017-07-03 DIAGNOSIS — Z9114 Patient's other noncompliance with medication regimen: Secondary | ICD-10-CM | POA: Diagnosis not present

## 2017-07-03 DIAGNOSIS — I5031 Acute diastolic (congestive) heart failure: Secondary | ICD-10-CM

## 2017-07-03 DIAGNOSIS — J45909 Unspecified asthma, uncomplicated: Secondary | ICD-10-CM | POA: Diagnosis present

## 2017-07-03 DIAGNOSIS — G2581 Restless legs syndrome: Secondary | ICD-10-CM | POA: Diagnosis present

## 2017-07-03 DIAGNOSIS — Z87891 Personal history of nicotine dependence: Secondary | ICD-10-CM | POA: Diagnosis not present

## 2017-07-03 DIAGNOSIS — E1100 Type 2 diabetes mellitus with hyperosmolarity without nonketotic hyperglycemic-hyperosmolar coma (NKHHC): Secondary | ICD-10-CM | POA: Diagnosis present

## 2017-07-03 DIAGNOSIS — F4310 Post-traumatic stress disorder, unspecified: Secondary | ICD-10-CM | POA: Diagnosis present

## 2017-07-03 DIAGNOSIS — E876 Hypokalemia: Secondary | ICD-10-CM | POA: Diagnosis not present

## 2017-07-03 DIAGNOSIS — N184 Chronic kidney disease, stage 4 (severe): Secondary | ICD-10-CM | POA: Diagnosis present

## 2017-07-03 DIAGNOSIS — E1101 Type 2 diabetes mellitus with hyperosmolarity with coma: Secondary | ICD-10-CM | POA: Diagnosis present

## 2017-07-03 DIAGNOSIS — F319 Bipolar disorder, unspecified: Secondary | ICD-10-CM | POA: Diagnosis present

## 2017-07-03 DIAGNOSIS — E860 Dehydration: Secondary | ICD-10-CM | POA: Diagnosis present

## 2017-07-03 DIAGNOSIS — Z9111 Patient's noncompliance with dietary regimen: Secondary | ICD-10-CM | POA: Diagnosis not present

## 2017-07-03 DIAGNOSIS — N183 Chronic kidney disease, stage 3 (moderate): Secondary | ICD-10-CM | POA: Diagnosis not present

## 2017-07-03 DIAGNOSIS — R42 Dizziness and giddiness: Secondary | ICD-10-CM | POA: Diagnosis not present

## 2017-07-03 DIAGNOSIS — R188 Other ascites: Secondary | ICD-10-CM | POA: Diagnosis present

## 2017-07-03 DIAGNOSIS — Z95810 Presence of automatic (implantable) cardiac defibrillator: Secondary | ICD-10-CM | POA: Diagnosis not present

## 2017-07-03 DIAGNOSIS — R739 Hyperglycemia, unspecified: Secondary | ICD-10-CM

## 2017-07-03 DIAGNOSIS — Z23 Encounter for immunization: Secondary | ICD-10-CM | POA: Diagnosis not present

## 2017-07-03 DIAGNOSIS — N179 Acute kidney failure, unspecified: Secondary | ICD-10-CM | POA: Diagnosis not present

## 2017-07-03 DIAGNOSIS — I959 Hypotension, unspecified: Secondary | ICD-10-CM | POA: Diagnosis not present

## 2017-07-03 DIAGNOSIS — Z79899 Other long term (current) drug therapy: Secondary | ICD-10-CM | POA: Diagnosis not present

## 2017-07-03 DIAGNOSIS — I5022 Chronic systolic (congestive) heart failure: Secondary | ICD-10-CM | POA: Diagnosis not present

## 2017-07-03 DIAGNOSIS — I428 Other cardiomyopathies: Secondary | ICD-10-CM | POA: Diagnosis not present

## 2017-07-03 DIAGNOSIS — I130 Hypertensive heart and chronic kidney disease with heart failure and stage 1 through stage 4 chronic kidney disease, or unspecified chronic kidney disease: Secondary | ICD-10-CM | POA: Diagnosis present

## 2017-07-03 DIAGNOSIS — E1122 Type 2 diabetes mellitus with diabetic chronic kidney disease: Secondary | ICD-10-CM | POA: Diagnosis present

## 2017-07-03 DIAGNOSIS — E871 Hypo-osmolality and hyponatremia: Secondary | ICD-10-CM | POA: Diagnosis not present

## 2017-07-03 DIAGNOSIS — G4700 Insomnia, unspecified: Secondary | ICD-10-CM | POA: Diagnosis present

## 2017-07-03 DIAGNOSIS — I2510 Atherosclerotic heart disease of native coronary artery without angina pectoris: Secondary | ICD-10-CM | POA: Diagnosis present

## 2017-07-03 DIAGNOSIS — N170 Acute kidney failure with tubular necrosis: Secondary | ICD-10-CM | POA: Diagnosis present

## 2017-07-03 DIAGNOSIS — F909 Attention-deficit hyperactivity disorder, unspecified type: Secondary | ICD-10-CM | POA: Diagnosis present

## 2017-07-03 LAB — BASIC METABOLIC PANEL
Anion gap: 15 (ref 5–15)
Anion gap: 17 — ABNORMAL HIGH (ref 5–15)
Anion gap: 17 — ABNORMAL HIGH (ref 5–15)
BUN: 89 mg/dL — AB (ref 6–20)
BUN: 104 mg/dL — AB (ref 6–20)
BUN: 105 mg/dL — AB (ref 6–20)
CHLORIDE: 89 mmol/L — AB (ref 101–111)
CO2: 28 mmol/L (ref 22–32)
CO2: 24 mmol/L (ref 22–32)
CO2: 23 mmol/L (ref 22–32)
CREATININE: 4.42 mg/dL — AB (ref 0.44–1.00)
CREATININE: 5.14 mg/dL — AB (ref 0.44–1.00)
CREATININE: 4.74 mg/dL — AB (ref 0.44–1.00)
Calcium: 9.9 mg/dL (ref 8.9–10.3)
Calcium: 9.4 mg/dL (ref 8.9–10.3)
Calcium: 9.2 mg/dL (ref 8.9–10.3)
Chloride: 90 mmol/L — ABNORMAL LOW (ref 101–111)
Chloride: 89 mmol/L — ABNORMAL LOW (ref 101–111)
GFR calc Af Amer: 12 mL/min — ABNORMAL LOW (ref >60)
GFR calc Af Amer: 10 mL/min — ABNORMAL LOW (ref >60)
GFR calc Af Amer: 11 mL/min — ABNORMAL LOW (ref >60)
GFR calc non Af Amer: 10 mL/min — ABNORMAL LOW (ref >60)
GFR, EST NON AFRICAN AMERICAN: 9 mL/min — AB (ref >60)
GFR, EST NON AFRICAN AMERICAN: 10 mL/min — AB (ref >60)
GLUCOSE: 138 mg/dL — AB (ref 65–99)
GLUCOSE: 246 mg/dL — AB (ref 65–99)
GLUCOSE: 393 mg/dL — AB (ref 65–99)
Potassium: 3 mmol/L — ABNORMAL LOW (ref 3.5–5.1)
Potassium: 3.1 mmol/L — ABNORMAL LOW (ref 3.5–5.1)
Potassium: 3.6 mmol/L (ref 3.5–5.1)
SODIUM: 132 mmol/L — AB (ref 135–145)
SODIUM: 131 mmol/L — AB (ref 135–145)
SODIUM: 129 mmol/L — AB (ref 135–145)

## 2017-07-03 LAB — ECHOCARDIOGRAM COMPLETE
Height: 65 in
WEIGHTICAEL: 2941.82 [oz_av]

## 2017-07-03 LAB — GLUCOSE, CAPILLARY
Glucose-Capillary: 119 mg/dL — ABNORMAL HIGH (ref 65–99)
Glucose-Capillary: 131 mg/dL — ABNORMAL HIGH (ref 65–99)
Glucose-Capillary: 165 mg/dL — ABNORMAL HIGH (ref 65–99)
Glucose-Capillary: 177 mg/dL — ABNORMAL HIGH (ref 65–99)
Glucose-Capillary: 178 mg/dL — ABNORMAL HIGH (ref 65–99)
Glucose-Capillary: 185 mg/dL — ABNORMAL HIGH (ref 65–99)
Glucose-Capillary: 185 mg/dL — ABNORMAL HIGH (ref 65–99)
Glucose-Capillary: 187 mg/dL — ABNORMAL HIGH (ref 65–99)
Glucose-Capillary: 192 mg/dL — ABNORMAL HIGH (ref 65–99)
Glucose-Capillary: 268 mg/dL — ABNORMAL HIGH (ref 65–99)
Glucose-Capillary: 300 mg/dL — ABNORMAL HIGH (ref 65–99)
Glucose-Capillary: 305 mg/dL — ABNORMAL HIGH (ref 65–99)
Glucose-Capillary: 381 mg/dL — ABNORMAL HIGH (ref 65–99)
Glucose-Capillary: 396 mg/dL — ABNORMAL HIGH (ref 65–99)
Glucose-Capillary: >600 mg/dL (ref 65–99)
Glucose-Capillary: >600 mg/dL (ref 65–99)

## 2017-07-03 LAB — TSH
TSH: 2.888 u[IU]/mL (ref 0.350–4.500)
TSH: 2.715 u[IU]/mL (ref 0.350–4.500)

## 2017-07-03 LAB — PROTEIN / CREATININE RATIO, URINE
CREATININE, URINE: 128 mg/dL
Protein Creatinine Ratio: 0.47 mg/mg{Cre} — ABNORMAL HIGH (ref 0.00–0.15)
TOTAL PROTEIN, URINE: 60 mg/dL

## 2017-07-03 LAB — MRSA PCR SCREENING: MRSA BY PCR: NEGATIVE

## 2017-07-03 LAB — MAGNESIUM: MAGNESIUM: 2.6 mg/dL — AB (ref 1.7–2.4)

## 2017-07-03 LAB — TROPONIN I: TROPONIN I: 0.06 ng/mL — AB (ref <0.03)

## 2017-07-03 MED ORDER — QUETIAPINE FUMARATE 300 MG PO TABS
300.0000 mg | ORAL_TABLET | Freq: Every day | ORAL | Status: DC
  Administered 2017-07-03 – 2017-07-04 (×3): 300 mg via ORAL
  Filled 2017-07-03 (×4): qty 1

## 2017-07-03 MED ORDER — SODIUM CHLORIDE 0.9 % IV SOLN
INTRAVENOUS | Status: DC
  Administered 2017-07-03 (×2): via INTRAVENOUS

## 2017-07-03 MED ORDER — CARVEDILOL 6.25 MG PO TABS: 6.2500 mg | ORAL_TABLET | Freq: Two times a day (BID) | ORAL | Status: DC

## 2017-07-03 MED ORDER — ONDANSETRON HCL 4 MG/2ML IJ SOLN: 4.0000 mg | Freq: Four times a day (QID) | INTRAMUSCULAR | Status: DC | PRN

## 2017-07-03 MED ORDER — LIVING WELL WITH DIABETES BOOK
Freq: Once | Status: AC
  Administered 2017-07-03: 19:00:00
  Filled 2017-07-03 (×2): qty 1

## 2017-07-03 MED ORDER — ONDANSETRON HCL 4 MG PO TABS: 4.0000 mg | ORAL_TABLET | Freq: Four times a day (QID) | ORAL | Status: DC | PRN

## 2017-07-03 MED ORDER — ACETAMINOPHEN 325 MG PO TABS
650.0000 mg | ORAL_TABLET | Freq: Four times a day (QID) | ORAL | Status: DC | PRN
  Administered 2017-07-03: 650 mg via ORAL
  Filled 2017-07-03: qty 2

## 2017-07-03 MED ORDER — POTASSIUM CHLORIDE CRYS ER 20 MEQ PO TBCR: 20.0000 meq | EXTENDED_RELEASE_TABLET | Freq: Every day | ORAL | Status: DC

## 2017-07-03 MED ORDER — POTASSIUM CHLORIDE CRYS ER 20 MEQ PO TBCR
40.0000 meq | EXTENDED_RELEASE_TABLET | Freq: Once | ORAL | Status: AC
  Administered 2017-07-03: 40 meq via ORAL
  Filled 2017-07-03: qty 2

## 2017-07-03 MED ORDER — SACUBITRIL-VALSARTAN 24-26 MG PO TABS
1.0000 | ORAL_TABLET | Freq: Two times a day (BID) | ORAL | Status: DC
  Filled 2017-07-03: qty 1

## 2017-07-03 MED ORDER — PAROXETINE HCL 30 MG PO TABS
30.0000 mg | ORAL_TABLET | Freq: Every day | ORAL | Status: DC
  Administered 2017-07-03 – 2017-07-05 (×3): 30 mg via ORAL
  Filled 2017-07-03 (×3): qty 1

## 2017-07-03 MED ORDER — HEPARIN SODIUM (PORCINE) 5000 UNIT/ML IJ SOLN
5000.0000 [IU] | Freq: Three times a day (TID) | INTRAMUSCULAR | Status: DC
  Administered 2017-07-03 – 2017-07-05 (×8): 5000 [IU] via SUBCUTANEOUS
  Filled 2017-07-03 (×8): qty 1

## 2017-07-03 MED ORDER — SACUBITRIL-VALSARTAN 49-51 MG PO TABS: 1.0000 | ORAL_TABLET | Freq: Two times a day (BID) | ORAL | Status: DC

## 2017-07-03 MED ORDER — DOCUSATE SODIUM 100 MG PO CAPS
100.0000 mg | ORAL_CAPSULE | Freq: Two times a day (BID) | ORAL | Status: DC
  Administered 2017-07-03 – 2017-07-05 (×4): 100 mg via ORAL
  Filled 2017-07-03 (×5): qty 1

## 2017-07-03 MED ORDER — LORAZEPAM 0.5 MG PO TABS: 0.5000 mg | ORAL_TABLET | Freq: Three times a day (TID) | ORAL | Status: DC | PRN

## 2017-07-03 MED ORDER — INSULIN STARTER KIT- PEN NEEDLES (ENGLISH)
1.0000 | Freq: Once | Status: AC
  Administered 2017-07-03: 1
  Filled 2017-07-03: qty 1

## 2017-07-03 MED ORDER — SPIRONOLACTONE 25 MG PO TABS: 25.0000 mg | ORAL_TABLET | Freq: Every day | ORAL | Status: DC

## 2017-07-03 MED ORDER — INFLUENZA VAC SPLIT QUAD 0.5 ML IM SUSY
0.5000 mL | PREFILLED_SYRINGE | INTRAMUSCULAR | Status: AC
  Administered 2017-07-04: 0.5 mL via INTRAMUSCULAR
  Filled 2017-07-03: qty 0.5

## 2017-07-03 MED ORDER — PRAZOSIN HCL 1 MG PO CAPS
1.0000 mg | ORAL_CAPSULE | Freq: Every day | ORAL | Status: DC
  Administered 2017-07-03: 1 mg via ORAL
  Filled 2017-07-03: qty 1

## 2017-07-03 MED ORDER — ATORVASTATIN CALCIUM 20 MG PO TABS
40.0000 mg | ORAL_TABLET | Freq: Every day | ORAL | Status: DC
  Administered 2017-07-03 – 2017-07-04 (×2): 40 mg via ORAL
  Filled 2017-07-03 (×2): qty 2

## 2017-07-03 MED ORDER — FUROSEMIDE 10 MG/ML IJ SOLN: 40.0000 mg | Freq: Four times a day (QID) | INTRAMUSCULAR | Status: DC

## 2017-07-03 MED ORDER — SODIUM CHLORIDE 0.9 % IV SOLN
INTRAVENOUS | Status: DC
  Administered 2017-07-03 – 2017-07-05 (×4): via INTRAVENOUS

## 2017-07-03 MED ORDER — PANTOPRAZOLE SODIUM 40 MG PO TBEC
40.0000 mg | DELAYED_RELEASE_TABLET | Freq: Every day | ORAL | Status: DC
  Administered 2017-07-03 – 2017-07-05 (×3): 40 mg via ORAL
  Filled 2017-07-03 (×3): qty 1

## 2017-07-03 MED ORDER — DICYCLOMINE HCL 20 MG PO TABS
20.0000 mg | ORAL_TABLET | Freq: Three times a day (TID) | ORAL | Status: DC | PRN
  Filled 2017-07-03: qty 1

## 2017-07-03 MED ORDER — QUETIAPINE FUMARATE 200 MG PO TABS
300.0000 mg | ORAL_TABLET | Freq: Every day | ORAL | Status: DC
  Filled 2017-07-03: qty 1

## 2017-07-03 MED ORDER — PNEUMOCOCCAL VAC POLYVALENT 25 MCG/0.5ML IJ INJ
0.5000 mL | INJECTION | INTRAMUSCULAR | Status: AC
  Administered 2017-07-04: 0.5 mL via INTRAMUSCULAR
  Filled 2017-07-03: qty 0.5

## 2017-07-03 MED ORDER — NITROGLYCERIN 0.4 MG SL SUBL
0.4000 mg | SUBLINGUAL_TABLET | SUBLINGUAL | Status: DC | PRN
Start: 2017-07-03 — End: 2017-07-05

## 2017-07-03 MED ORDER — ACETAMINOPHEN 650 MG RE SUPP: 650.0000 mg | Freq: Four times a day (QID) | RECTAL | Status: DC | PRN

## 2017-07-03 MED ORDER — INSULIN GLARGINE 100 UNIT/ML ~~LOC~~ SOLN
30.0000 [IU] | Freq: Every day | SUBCUTANEOUS | Status: DC
  Administered 2017-07-03 – 2017-07-04 (×2): 30 [IU] via SUBCUTANEOUS
  Filled 2017-07-03 (×2): qty 0.3

## 2017-07-03 MED ORDER — INSULIN ASPART 100 UNIT/ML ~~LOC~~ SOLN
0.0000 [IU] | Freq: Three times a day (TID) | SUBCUTANEOUS | Status: DC
  Administered 2017-07-03: 15 [IU] via SUBCUTANEOUS
  Administered 2017-07-03: 11 [IU] via SUBCUTANEOUS
  Administered 2017-07-04: 5 [IU] via SUBCUTANEOUS
  Administered 2017-07-04: 8 [IU] via SUBCUTANEOUS
  Administered 2017-07-04: 11 [IU] via SUBCUTANEOUS
  Administered 2017-07-05 (×2): 5 [IU] via SUBCUTANEOUS
  Filled 2017-07-03 (×7): qty 1

## 2017-07-03 MED ORDER — INSULIN ASPART 100 UNIT/ML ~~LOC~~ SOLN
0.0000 [IU] | Freq: Every day | SUBCUTANEOUS | Status: DC
Start: 2017-07-03 — End: 2017-07-05
  Administered 2017-07-03 – 2017-07-04 (×2): 3 [IU] via SUBCUTANEOUS
  Filled 2017-07-03 (×2): qty 1

## 2017-07-03 NOTE — Consult Note (Signed)
Cardiology Consultation:   Patient ID: Bonnie Ochoa; [id number]; 01/20/1966   Admit date: 07/02/2017 Date of Consult: 07/03/2017  Primary Care Provider: Valerie Roys, DO Primary Cardiologist: Rockey Situ   Patient Profile:   Bonnie Ochoa is a 51 y.o. female with a hx of chronic systolic CHF due to NICM s/p ICD, pulmonary hypertension, moderate mitral regurgitation, polysubstance abuse, noncompliance, bipolar, anxiety, cirrhosis with ascites requiring prior paracentesis, DM, HTN, asthma, OSA, RLS, anemia who is being seen today for the evaluation of CHF in the setting of ARF and hyperglycemia at the request of Dr. Marcille Blanco.  History of Present Illness:   Bonnie Ochoa previously resided in [place] until 2017. Admitted 05/2016 with acute on chronic systolic CHF. Underwent 20 L of diuresis. Echo at that time showed EF < 25%, diffuse HK, DD, moderate MR, moderately dilated LA, mildly dilated RA, PASP 64 mmHg, trivial pericardial effusion. Admitted to the hospital in 08/2016 with numerous complaints including chest pain, SOB, abdominal pain, dysuria, urinary frequency, syncope, lightheadedness, nausea, vomiting, and diarrhea. Underwent successful paracentesis. Readmitted 09/2016 with chest and abdominal pain in the setting of medication noncompliance. She was most recently seen by Dr. Rockey Situ in 11/2016 with a weight of 166 pounds at that time. She has since been followed by the [facility] with weights trending up at each visit and most recent weight there trending up to 199 pounds, felt to be euvolemic at each visit without any changes being made.   Patient reports 2-3 weeks of worsening SOB, abdominal swelling, orthopnea, early satiety, PND, cough, and nausea. She denies missing any doses of her medications other than "last Tuesday," which would have been 11/13. She continues to eat out at fast food restaurants and eat foods at home that are high in salt. She has been noting polydipsia over  this same time span with > 2 L of fluids daily. Self stopped her metformin several weeks ago. Has been drinking increased Sprite. Because of her persistent symptoms, she presented to [facility].   Upon the patient's arrival to [facility] they were found to have BP 92/73-->77/56, HR 80s to 90s bpm, temp 98.2, oxygen saturation 99% on room air, weight 183 pounds. EKG as below, CXR showed no active cardiopulmonary process. RUQ ultrasound not acute. Labs showed BUN 114-->89, SCr 5.45-->4.42 (baseline 1.9), glucose 680-->138, Na 120-->132, K+ 4.3-->3.0, troponin 0.04-->0.06, TSH normal, unremarkable cbc, lipase 162, alk phos 157, T bili 1.3. She was started on an insulin gtt and IV fluids. Echo is pending. BP has improved to the 17C systolic. No chest pain. Continues to note SOB with nausea and orthopnea.   Past Medical History:  Diagnosis Date  . ADHD   . Arthritis   . Asthma   . Bipolar 1 disorder ([place])   . CHF (congestive heart failure) ([place])   . Cirrhosis of liver ([place])   . Coronary artery disease   . Depression   . Diabetes mellitus without complication ([place])   . Diverticulitis   . Hypertension   . IBS (irritable bowel syndrome)   . Insomnia   . Migraines   . PTSD (post-traumatic stress disorder)   . Restless leg syndrome   . Sleep apnea   . Vertigo     Past Surgical History:  Procedure Laterality Date  . ABDOMINAL HYSTERECTOMY    . debribalator  2016  . INSERT / REPLACE / REMOVE PACEMAKER    . INSERTION OF ICD    . TONSILECTOMY, ADENOIDECTOMY, BILATERAL MYRINGOTOMY AND  TUBES    . TONSILLECTOMY    . TONSILLECTOMY    . TUBAL LIGATION  1980  . TUBAL LIGATION       Home Meds: Prior to Admission medications   Medication Sig Start Date End Date Taking? Authorizing Provider  atorvastatin (LIPITOR) 40 MG tablet Take 1 tablet (40 mg total) by mouth daily. 08/21/16  Yes Minna Merritts, MD  carvedilol (COREG) 6.25 MG tablet Take 1 tablet (6.25 mg total) by mouth 2 (two) times daily with a meal.  08/21/16  Yes Gollan, Kathlene November, MD  dicyclomine (BENTYL) 20 MG tablet TAKE 1 TABLET (20 MG TOTAL) BY MOUTH 3 (THREE) TIMES DAILY AS NEEDED FOR SPASMS. 04/16/17  Yes Johnson, Megan P, DO  furosemide (LASIX) 40 MG tablet Take 60 mg by mouth 2 (two) times daily.   Yes [provider]  KLOR-CON M20 20 MEQ tablet TAKE 1 TABLET (20 MEQ TOTAL) BY MOUTH DAILY. 06/17/17  Yes Gollan, Kathlene November, MD  LORazepam (ATIVAN) 0.5 MG tablet Take 0.5 mg by mouth every 8 (eight) hours as needed for anxiety.   Yes [provider]  nitroGLYCERIN (NITROSTAT) 0.4 MG SL tablet Place 1 tablet (0.4 mg total) under the tongue every 5 (five) minutes as needed for chest pain. 06/04/16  Yes Gladstone Lighter, MD  ondansetron (ZOFRAN ODT) 4 MG disintegrating tablet Take 1 tablet (4 mg total) by mouth every 8 (eight) hours as needed for nausea or vomiting. 12/07/16  Yes Earleen Newport, MD  pantoprazole (PROTONIX) 40 MG tablet TAKE 1 TABLET (40 MG TOTAL) BY MOUTH DAILY. 05/10/17 05/10/18 Yes Wohl, Darren, MD  PARoxetine (PAXIL) 30 MG tablet TAKE 1 TABLET BY MOUTH EVERY DAY 06/17/17  Yes Johnson, Megan P, DO  potassium chloride (K-DUR,KLOR-CON) 10 MEQ tablet Take 20 mEq by mouth daily.    Yes [provider]  prazosin (MINIPRESS) 1 MG capsule Take 1 mg by mouth at bedtime.   Yes [provider]  QUEtiapine (SEROQUEL) 300 MG tablet TAKE 1 TABLET (300 MG TOTAL) BY MOUTH AT BEDTIME. 06/17/17  Yes Johnson, Megan P, DO  sacubitril-valsartan (ENTRESTO) 49-51 MG Take 1 tablet by mouth 2 (two) times daily. 04/29/17  Yes [name] A, FNP  spironolactone (ALDACTONE) 25 MG tablet Take 1 tablet (25 mg total) by mouth daily. 08/21/16  Yes Minna Merritts, MD    Inpatient Medications: Scheduled Meds: . atorvastatin  40 mg Oral Daily  . docusate sodium  100 mg Oral BID  . heparin  5,000 Units Subcutaneous Q8H  . pantoprazole  40 mg Oral Daily  . PARoxetine  30 mg Oral Daily  . potassium chloride  40 mEq Oral  Once  . prazosin  1 mg Oral QHS  . QUEtiapine  300 mg Oral QHS   Continuous Infusions: . sodium chloride 100 mL/hr at 07/03/17 0831  . insulin (NOVOLIN-R) infusion 3.2 mL/hr at 07/03/17 0831   PRN Meds: acetaminophen **OR** acetaminophen, dicyclomine, LORazepam, nitroGLYCERIN, ondansetron **OR** ondansetron (ZOFRAN) IV  Allergies:   Allergies  Allergen Reactions  . Levothyroxine Rash    Social History:   Social History   Socioeconomic History  . Marital status: Single    Spouse name: Not on file  . Number of children: Not on file  . Years of education: Not on file  . Highest education level: Not on file  Social Needs  . Financial resource strain: Not on file  . Food insecurity - worry: Not on file  . Food insecurity -  inability: Not on file  . Transportation needs - medical: Not on file  . Transportation needs - non-medical: Not on file  Occupational History  . Not on file  Tobacco Use  . Smoking status: Former Research scientist (life sciences)  . Smokeless tobacco: Never Used  Substance and Sexual Activity  . Alcohol use: No  . Drug use: Yes    Types: Marijuana    Comment: last smoked marijuana earlier today  . Sexual activity: No    Birth control/protection: Surgical  Other Topics Concern  . Not on file  Social History Narrative  . Not on file     Family History:   Family History  Problem Relation Age of Onset  . Hypertension Mother   . Hyperlipidemia Mother   . Heart disease Mother   . Hypertension Sister   . Asthma Sister   . Heart disease Sister   . Diabetes Sister   . Cancer Sister   . Alzheimer's disease Maternal Grandfather   . Hyperlipidemia Brother   . Asthma Sister   . Hypertension Sister   . Diabetes Sister     ROS:  Review of Systems  Constitutional: Positive for malaise/fatigue. Negative for chills, diaphoresis, fever and weight loss.  HENT: Negative for congestion.   Eyes: Negative for discharge and redness.  Respiratory: Positive for shortness of breath.  Negative for cough, hemoptysis, sputum production and wheezing.   Cardiovascular: Positive for orthopnea. Negative for chest pain, palpitations, claudication, leg swelling and PND.  Gastrointestinal: Positive for nausea. Negative for abdominal pain, blood in stool, heartburn, melena and vomiting.  Genitourinary: Negative for hematuria.  Musculoskeletal: Negative for falls and myalgias.  Skin: Negative for rash.  Neurological: Positive for weakness. Negative for dizziness, tingling, tremors, sensory change, speech change, focal weakness and loss of consciousness.  Endo/Heme/Allergies: Does not bruise/bleed easily.  Psychiatric/Behavioral: Negative for substance abuse. The patient is not nervous/anxious.   All other systems reviewed and are negative.     Physical Exam/Data:   Vitals:   07/03/17 0605 07/03/17 0700 07/03/17 0800 07/03/17 0830  BP: 99/68 (!) 59/47 (!) 74/51 (!) 77/56  Pulse: 92 95 93 91  Resp: 14 19 19  (!) 8  Temp:    97.8 F (36.6 C)  TempSrc:    Oral  SpO2: 100% 94% 98% 98%  Weight:      Height:        Intake/Output Summary (Last 24 hours) at 07/03/2017 0847 Last data filed at 07/03/2017 0831 Gross per 24 hour  Intake 718.56 ml  Output -  Net 718.56 ml   Filed Weights   07/02/17 1659 07/03/17 0111  Weight: 180 lb (81.6 kg) 183 lb 13.8 oz (83.4 kg)   Body mass index is 30.6 kg/m.   Physical Exam: General: Well developed, well nourished, in no acute distress. Head: Normocephalic, atraumatic, sclera non-icteric, no xanthomas, nares without discharge.  Neck: Negative for carotid bruits. JVD not elevated. Lungs: Clear bilaterally to auscultation without wheezes, rales, or rhonchi. Breathing is unlabored. Heart: RRR with S1 S2. No murmurs, rubs, or gallops appreciated. Abdomen: Soft, non-tender, non-distended with normoactive bowel sounds. No hepatomegaly. No rebound/guarding. No obvious abdominal masses. Msk:  Strength and tone appear normal for  age. Extremities: No clubbing or cyanosis. No edema. Distal pedal pulses are 2+ and equal bilaterally. Neuro: Alert and oriented X 3. No facial asymmetry. No focal deficit. Moves all extremities spontaneously. Psych:  Responds to questions appropriately with a normal affect.   EKG:  The EKG was personally  reviewed and demonstrates: NSR, 89 bpm, left atrial enlargement, LVH with early repolarization, inferolateral TWI Telemetry:  Telemetry was personally reviewed and demonstrates: NSR, 90s bpm  Weights: Filed Weights   07/02/17 1659 07/03/17 0111  Weight: 180 lb (81.6 kg) 183 lb 13.8 oz (83.4 kg)    Relevant CV Studies: TTE 05/2016: Study Conclusions  - Left ventricle: The cavity size was normal. Systolic function was   severely reduced. The estimated ejection fraction was less than   25%. Diffuse hypokinesis. Regional wall motion abnormalities   cannot be excluded. Doppler parameters are consistent with   abnormal left ventricular relaxation. - Mitral valve: There was moderate regurgitation. - Left atrium: The atrium was moderately dilated. - Right ventricle: Pacer wire or catheter noted in right ventricle. - Right atrium: The atrium was mildly dilated. - Tricuspid valve: There was moderate regurgitation. - Pulmonary arteries: Systolic pressure was severely elevated. PA   peak pressure: 64 mm Hg (S). - Pericardium, extracardiac: A trivial pericardial effusion was   identified.  TTE pending  Laboratory Data:  Chemistry Recent Labs  Lab 07/02/17 1656 07/03/17 0342  NA 120* 132*  K 4.3 3.0*  CL 76* 89*  CO2 24 28  GLUCOSE 680* 138*  BUN 114* 89*  CREATININE 5.45* 4.42*  CALCIUM 10.1 9.9  GFRNONAA 8* 10*  GFRAA 9* 12*  ANIONGAP 20* 15    Recent Labs  Lab 07/02/17 1656  PROT 10.0*  ALBUMIN 4.9  AST 18  ALT 17  ALKPHOS 157*  BILITOT 1.3*   Hematology Recent Labs  Lab 07/02/17 1656  WBC 6.0  RBC 4.17  HGB 12.9  HCT 37.6  MCV 90.2  MCH 30.9  MCHC  34.2  RDW 13.0  PLT 232   Cardiac Enzymes Recent Labs  Lab 07/02/17 1656 07/03/17 0342  TROPONINI 0.04* 0.06*   No results for input(s): TROPIPOC in the last 168 hours.  BNPNo results for input(s): BNP, PROBNP in the last 168 hours.  DDimer No results for input(s): DDIMER in the last 168 hours.  Radiology/Studies:  Dg Chest 2 View  Result Date: 07/02/2017 IMPRESSION: No active cardiopulmonary disease. Electronically Signed   By: Logan Bores M.D.   On: 07/02/2017 17:34   US Abdomen Limited Ruq  Result Date: 07/02/2017 IMPRESSION: Unremarkable right upper quadrant ultrasound. Electronically Signed   By: Anner Crete M.D.   On: 07/02/2017 20:05    Assessment and Plan:   1. Chronic systolic CHF/pulmonary HTN: -She does not appear volume up at this time -Agree with gentle IV hydration to replete volume loss  -HF medications on hold given hypotension, restart when able -Echo pending -Compliance is important  2. ARF: -Improving -Renal on board  -Likely ATN from volume depletion and hypotension   3. Hyperglycemia: -Improving -Likely from dietary and medication noncompliance   4. Pseudohyponatremia: -From hyperglycemia -Improved with improvement in glucose  5. Cirrhosis: -Resume Lasix and spironolactone as renal function and BP allow -Per IM  6. Elevated troponin: -Minimally elevated with a peak of 0.06 -No chest pain -Likely supply demand ischemia from the above -Echo pending  7. Constipation: -Reports no BM in 2 weeks -Possibly playing a role in her nausea -Per IM   For questions or updates, please contact [place] Please consult [email or web address] for contact info under Cardiology/STEMI.   Signed, Christell Faith, PA-C [place] Pager: [phone] 07/03/2017, 8:47 AM

## 2017-07-03 NOTE — Progress Notes (Addendum)
Inpatient Diabetes Program Recommendations  AACE/ADA: New Consensus Statement on Inpatient Glycemic Control (2015)  Target Ranges:  Prepandial:   less than 140 mg/dL      Peak postprandial:   less than 180 mg/dL (1-2 hours)      Critically ill patients:  140 - 180 mg/dL   Lab Results  Component Value Date   GLUCAP 305 (H) 07/03/2017   HGBA1C 7.1 12/28/2016    Review of Glycemic ControlResults for Bonnie Ochoa, Bonnie Ochoa (MRN [id number]) as of 07/03/2017 12:51  Ref. Range 07/03/2017 06:06 07/03/2017 06:55 07/03/2017 07:17 07/03/2017 08:30 07/03/2017 09:27 07/03/2017 11:45  Glucose-Capillary Latest Ref Range: 65 - 99 mg/dL 192 (H) 187 (H) 185 (H) 165 (H) 177 (H) 305 (H)    Diabetes history: Type 2 Dm Outpatient Diabetes medications: Metformin- patient states that she had not taken in over a year Current orders for Inpatient glycemic control:  Transitioning off insulin drip to Lantus 30 units and Novolog moderate tid with meals and HS  Inpatient Diabetes Program Recommendations:    Spoke with patient regarding DM.  She states that she has had DM and used to check her blood sugars but has not been doing this for a while.  She noticed over the past few weeks blurred vision and feeling week.  We discussed the signs of high blood sugars including thirst, blurred vision, frequent urination and fatigue.  We also discussed potential complications of diabetes and patient states "it may have hurt my kidneys".  We further explored hypoglycemia, signs, symptoms and treatment.  Patient states she lives with her sister and wife who had been encouraging her to see the doctor for some time.  I explained that she may need insulin at d/c based on blood sugars and A1C level.  Discussed living well with DM booklet and that dietician would further discuss diet with her .  Will also order "insulin starter kit" for patient.  Patient verbalized understanding.  She seems to have support at home.  Patient will need new glucose  meter at d/c.   Thanks,  Adah Perl, RN, BC-ADM Inpatient Diabetes Coordinator Pager [phone] (8a-5p)

## 2017-07-03 NOTE — Progress Notes (Signed)
Inpatient Diabetes Program Recommendations  AACE/ADA: New Consensus Statement on Inpatient Glycemic Control (2015)  Target Ranges:  Prepandial:   less than 140 mg/dL      Peak postprandial:   less than 180 mg/dL (1-2 hours)      Critically ill patients:  140 - 180 mg/dL   Lab Results  Component Value Date   GLUCAP 165 (H) 07/03/2017   HGBA1C 7.1 12/28/2016    Review of Glycemic ControlResults for MARNISHA, LEMUS (MRN [id number]) as of 07/03/2017 09:17  Ref. Range 07/03/2017 05:05 07/03/2017 06:06 07/03/2017 06:55 07/03/2017 07:17 07/03/2017 08:30  Glucose-Capillary Latest Ref Range: 65 - 99 mg/dL 594 (H) 707 (H) 615 (H) 185 (H) 165 (H)   Diabetes history: none noted Outpatient Diabetes medications:None  Current orders for Inpatient glycemic control:  IV insulin-  Inpatient Diabetes Program Recommendations:   Please consider rechecking A1C.  Note that in May 2018 it was slightly elevated.  Upon transition off insulin drip, consider Lantus 12 units daily (0.15 units/hr).    Thanks, Beryl Meager, RN, BC-ADM Inpatient Diabetes Coordinator Pager [phone]

## 2017-07-03 NOTE — Plan of Care (Signed)
  RD consulted for nutrition education regarding diabetes.   Lab Results  Component Value Date   HGBA1C 7.1 12/28/2016   Met with patient at bedside. She reports she was diagnosed with diabetes about 2 years ago but has never received education from a Dietitian before. She reports she used to check her blood sugar daily and take Metformin, but she has not taken Metformin or checked her blood sugars in over a year. She lives with her wife at home. She eats approximately 2 meals daily. For breakfast she has cereal or eggs and toast. For dinner she has baked chicken with vegetables. Her meals are very far apart (breakfast is 8 am and dinner is 8pm). She drinks Ginger-ale and Gatorade throughout the day. She reports she is going to stop drinking sugar-sweetened beverages and will begin counting carbohydrates at meals so she can choose appropriate serving sizes. Also discussed more regular intake of meals so patient does not go so long between meals. She reports her wife is very supportive and will help her with managing diabetes.  RD provided "Carbohydrate Counting for People with Diabetes" handout from the Academy of [employer] and [employer]etics. Discussed different food groups and their effects on blood sugar, emphasizing carbohydrate-containing foods. Provided list of carbohydrates and recommended serving sizes of common foods.  RD provided "Using Nutrition Labels: Carbohydrates" handout from the Academy of [employer] and [employer]etics. Discussed how to read a nutrition label including looking at serving size and servings per container. Reviewed that there are 15 grams of carbohydrate in one carbohydrate choice. Encouraged patient to use chart for range of carbohydrate grams per choice when reading nutrition labels.  Discussed importance of controlled and consistent carbohydrate intake throughout the day. Provided examples of ways to balance meals/snacks and encouraged intake of high-fiber, whole grain complex  carbohydrates. Teach back method used.  Expect fair to good compliance.  Body mass index is 30.6 kg/m. Pt meets criteria for Obesity Class I based on current BMI.  Current diet order is Carbohydrate Modified, patient is consuming approximately 100% of meals at this time (per her report; not documented). Labs and medications reviewed. No further nutrition interventions warranted at this time. RD contact information provided. If additional nutrition issues arise, please re-consult RD.  Willey Blade, MS, [place], LDN Office: [phone] Pager: [phone] After Hours/Weekend Pager: [phone]

## 2017-07-03 NOTE — Progress Notes (Signed)
Educated patient on how to administer insulin using insulin pen. Had patient give return demonstration and she missed a few steps. Demonstrated three times and had to talk patient through each attempt of her doing return demonstration. Patients wife was present during demonstration and was asked if someone at home helps her with all of her medication administration, patients wife stated " either me or my sister in-law. Patient needs teaching reinforced until accurate return demonstration or teach back given.

## 2017-07-03 NOTE — Progress Notes (Signed)
Patient is taking carvedilol 6.25 mg bid and sacubitril/valsartan 49/21 mg bid PTA. Patient's blood pressure has been in the 80's systolic. Will hold BP meds until BP can stabilize.  Thomasene Ripple, PharmD, BCPS Clinical Pharmacist 07/03/2017

## 2017-07-03 NOTE — Consult Note (Signed)
Name: Bonnie Ochoa MRN: [id number] DOB: [birth year]    ADMISSION DATE:  07/02/2017  CONSULTATION DATE:  07/02/17  REFERRING MD : Dr. Sheryle Hailiamond  CHIEF COMPLAINT:  Abdominal pain, dizziness and chest pain  BRIEF PATIENT DESCRIPTION: 51 year old female  With Acute Renal Failure possibly related to hyperglycemia requiring insulin gtt  SIGNIFICANT EVENTS  11/21 >>  Patient admitted to the SDU with hyperglycemia, on insulin gtt  STUDIES:  none   HISTORY OF PRESENT ILLNESS:  Bonnie Ochoa is a 51 year old female with known history of ADHD, Asthma, Bipolar disorder,cirrhosis of liver,CAD,depression ,DM,Diverticulitis, Hypertension,IBS,PTSD,restless leg syndrome and vertigo.  Patient [facility]/20 with complaints of abdominal pain , chest pain and dizziness.  He also reports frequent urination and thirst. Patient denies any change in medication .   Patient's labs were concerning for hyperglycemia, AKI with elevated Anion gap.  Patient was started on insulin drip and sent to the SDU for further management.  PAST MEDICAL HISTORY :   has a past medical history of ADHD, Arthritis, Asthma, Bipolar 1 disorder (HCC), CHF (congestive heart failure) (HCC), Cirrhosis of liver (HCC), Coronary artery disease, Depression, Diabetes mellitus without complication (HCC), Diverticulitis, Hypertension, IBS (irritable bowel syndrome), Insomnia, Migraines, PTSD (post-traumatic stress disorder), Restless leg syndrome, Sleep apnea, and Vertigo.  has a past surgical history that includes Abdominal hysterectomy; Insertion of ICD; debribalator (2016); Tubal ligation (1980); Tonsilectomy, adenoidectomy, bilateral myringotomy and tubes; Insert / replace / remove pacemaker; Tonsillectomy; Tubal ligation; and Tonsillectomy. Prior to Admission medications   Medication Sig Start Date End Date Taking? Authorizing Provider  atorvastatin (LIPITOR) 40 MG tablet Take 1 tablet (40 mg total) by mouth daily. 08/21/16  Yes  Antonieta IbaGollan, Timothy J, MD  carvedilol (COREG) 6.25 MG tablet Take 1 tablet (6.25 mg total) by mouth 2 (two) times daily with a meal. 08/21/16  Yes Gollan, Tollie Pizzaimothy J, MD  dicyclomine (BENTYL) 20 MG tablet TAKE 1 TABLET (20 MG TOTAL) BY MOUTH 3 (THREE) TIMES DAILY AS NEEDED FOR SPASMS. 04/16/17  Yes Johnson, Megan P, DO  furosemide (LASIX) 40 MG tablet Take 60 mg by mouth 2 (two) times daily.   Yes [provider]  KLOR-CON M20 20 MEQ tablet TAKE 1 TABLET (20 MEQ TOTAL) BY MOUTH DAILY. 06/17/17  Yes Gollan, Tollie Pizzaimothy J, MD  LORazepam (ATIVAN) 0.5 MG tablet Take 0.5 mg by mouth every 8 (eight) hours as needed for anxiety.   Yes [provider]  nitroGLYCERIN (NITROSTAT) 0.4 MG SL tablet Place 1 tablet (0.4 mg total) under the tongue every 5 (five) minutes as needed for chest pain. 06/04/16  Yes Enid BaasKalisetti, Radhika, MD  ondansetron (ZOFRAN ODT) 4 MG disintegrating tablet Take 1 tablet (4 mg total) by mouth every 8 (eight) hours as needed for nausea or vomiting. 12/07/16  Yes Emily FilbertWilliams, Jonathan E, MD  pantoprazole (PROTONIX) 40 MG tablet TAKE 1 TABLET (40 MG TOTAL) BY MOUTH DAILY. 05/10/17 05/10/18 Yes Wohl, Darren, MD  PARoxetine (PAXIL) 30 MG tablet TAKE 1 TABLET BY MOUTH EVERY DAY 06/17/17  Yes Johnson, Megan P, DO  potassium chloride (K-DUR,KLOR-CON) 10 MEQ tablet Take 20 mEq by mouth daily.    Yes [provider]  prazosin (MINIPRESS) 1 MG capsule Take 1 mg by mouth at bedtime.   Yes [provider]  QUEtiapine (SEROQUEL) 300 MG tablet TAKE 1 TABLET (300 MG TOTAL) BY MOUTH AT BEDTIME. 06/17/17  Yes Johnson, Megan P, DO  sacubitril-valsartan (ENTRESTO) 49-51 MG Take 1 tablet by mouth  2 (two) times daily. 04/29/17  Yes Clarisa Kindred A, FNP  spironolactone (ALDACTONE) 25 MG tablet Take 1 tablet (25 mg total) by mouth daily. 08/21/16  Yes Antonieta Iba, MD   Allergies  Allergen Reactions  . Levothyroxine Rash    FAMILY HISTORY:  family history includes Alzheimer's disease in her  maternal grandfather; Asthma in her sister and sister; Cancer in her sister; Diabetes in her sister and sister; Heart disease in her mother and sister; Hyperlipidemia in her brother and mother; Hypertension in her mother, sister, and sister. SOCIAL HISTORY:  reports that she has quit smoking. she has never used smokeless tobacco. She reports that she uses drugs. Drug: Marijuana. She reports that she does not drink alcohol.  SUBJECTIVE: Patient states that "she has been feeling very lethargic , not able to ambulate and with abdominal pain"  VITAL SIGNS: Temp:  [97.5 F (36.4 C)] 97.5 F (36.4 C) (11/20 1658) Pulse Rate:  [75-91] 91 (11/20 2300) Resp:  [15-20] 20 (11/20 2300) BP: (92-127)/(73-94) 107/73 (11/20 2300) SpO2:  [94 %-99 %] 94 % (11/20 2300) Weight:  [180 lb (81.6 kg)] 180 lb (81.6 kg) (11/20 1659)  PHYSICAL EXAMINATION: General:  Middle aged AA female , in no acute distress Neuro:  Awake, Alert and oriented HEENT:  AT,[place],No JVD Cardiovascular:  S1S2,Regular, no m/r/g Lungs:  Clear bilaterally, no wheezes,crackles and rhonchi Abdomen:  Soft,NT,ND Musculoskeletal:  No edema,cyanosis Skin:  Warm,dry and intact  Recent Labs  Lab 07/02/17 1656  NA 120*  K 4.3  CL 76*  CO2 24  BUN 114*  CREATININE 5.45*  GLUCOSE 680*   Recent Labs  Lab 07/02/17 1656  HGB 12.9  HCT 37.6  WBC 6.0  PLT 232   Dg Chest 2 View  Result Date: 07/02/2017 CLINICAL DATA:  Chest pain.  Abdominal pain for 2 weeks. EXAM: CHEST  2 VIEW COMPARISON:  12/07/2016 FINDINGS: An ICD is unchanged. The cardiomediastinal silhouette is within normal limits. The lungs are well inflated and clear. No pleural effusion or pneumothorax is identified. No acute osseous abnormality is seen. IMPRESSION: No active cardiopulmonary disease. Electronically Signed   By: Sebastian Ache M.D.   On: 07/02/2017 17:34   US Abdomen Limited Ruq  Result Date: 07/02/2017 CLINICAL DATA:  51 year old female with right upper  quadrant abdominal pain and vomiting. EXAM: ULTRASOUND ABDOMEN LIMITED RIGHT UPPER QUADRANT COMPARISON:  Abdominal CT dated 12/08/2016 FINDINGS: Gallbladder: No gallstones or wall thickening visualized. No sonographic Murphy sign noted by sonographer. Common bile duct: Diameter: 5 mm Liver: Grossly unremarkable as visualized. Portal vein is patent on color Doppler imaging with normal direction of blood flow towards the liver. IMPRESSION: Unremarkable right upper quadrant ultrasound. Electronically Signed   By: Elgie Collard M.D.   On: 07/02/2017 20:05    ASSESSMENT / PLAN: Severe hyperglycemia AGMA Acute Kidney Injury related to dehydration Mildly elevated troponin secondary to demand ischemia Hyponatremia Elevated Lipase, possibly related to liver cirrhosis Hx of CAD Hx of hypertension Hx of IBS   Plan Agressive hydration with I/v fluids Continue Insulin gtt If blood glucose <250 mg/dl ,, then start on W09/8 Will transition to s/q insulin when 6 subsequent CBG readings <180 Continue Coreg/entresto Continue dicyclomine Zofran for nausea Follow up TSH Will hold Spironolactone/lasix  For now Continue Atorvastatin Will obtain ECHO Continue home dose seroquel Trend troponins Follow BMP   Bincy Varughese,AG-ACNP Pulmonary & Critical Care [facility]   07/03/2017, 12:30 AM   PCCM ATTENDING ATTESTATION:  I have  evaluated patient with the APP Varughese, reviewed database in its entirety and discussed care plan in detail. In addition, this patient was discussed on multidisciplinary rounds.   Blood glucoses are now controlled.  This is not DKA.  Therefore, we do not need to transition to D5 half-normal saline as noted above.  I have ordered a transition from insulin infusion to Lantus and sliding scale insulin.  I have placed order for transfer to MedSurg floor.  She will need extensive diabetic education.  After transfer, PCCM will sign off. Please call if we can be of  further assistance    Billy Fischer, MD PCCM service Mobile [phone] Pager [phone] 07/03/2017 3:22 PM

## 2017-07-03 NOTE — Consult Note (Signed)
[facility] CONSULT NOTE    Date: 07/03/2017                  [name]  MRN: [id number]  DOB: [birth year]  Age / Sex: 51 y.o., female         PCP: Dorcas Carrow, DO                 Service Requesting Consult: Hospitalist                 Reason for Consult: Acute renal failure/CKD stage IV            History of Present Illness: Patient is a 51 y.o. female with a PMHx of osteoarthritis, bipolar disorder, congestive heart failure, cirrhosis of the liver, depression, diabetes mellitus type 2, hypertension, irritable bowel syndrome, PTSD, restless leg syndrome, obstructive sleep apnea, who was admitted to [facility] on 07/02/2017 for evaluation of dizziness.  She has been having symptoms of dizziness for at least the past week.  Upon presentation her BUN was 114 with a creatinine of 5.4.  Subsequently her renal function did improved as BUN was down to 89 with a creatinine of 4.4.  However currently creatinine back up to 5.1 with a BUN of 104.  Patient is also significantly hypotensive.  She is currently maintained on an insulin drip.  Patient also states that she was taking a significant amount of BC powder at home.  In addition she has had some periods of nausea and vomiting at home.   Medications: Outpatient medications: Medications Prior to Admission  Medication Sig Dispense Refill Last Dose  . atorvastatin (LIPITOR) 40 MG tablet Take 1 tablet (40 mg total) by mouth daily. 90 tablet 3 07/01/2017  . carvedilol (COREG) 6.25 MG tablet Take 1 tablet (6.25 mg total) by mouth 2 (two) times daily with a meal. 180 tablet 3 07/01/2017  . dicyclomine (BENTYL) 20 MG tablet TAKE 1 TABLET (20 MG TOTAL) BY MOUTH 3 (THREE) TIMES DAILY AS NEEDED FOR SPASMS. 90 tablet 3 prn at prn  . furosemide (LASIX) 40 MG tablet Take 60 mg by mouth 2 (two) times daily.   07/01/2017  . KLOR-CON M20 20 MEQ tablet TAKE 1 TABLET (20 MEQ TOTAL) BY MOUTH DAILY. 30 tablet 0 07/01/2017   . LORazepam (ATIVAN) 0.5 MG tablet Take 0.5 mg by mouth every 8 (eight) hours as needed for anxiety.   07/01/2017  . nitroGLYCERIN (NITROSTAT) 0.4 MG SL tablet Place 1 tablet (0.4 mg total) under the tongue every 5 (five) minutes as needed for chest pain. 30 tablet 12 prn at prn  . ondansetron (ZOFRAN ODT) 4 MG disintegrating tablet Take 1 tablet (4 mg total) by mouth every 8 (eight) hours as needed for nausea or vomiting. 20 tablet 0 prn at prn  . pantoprazole (PROTONIX) 40 MG tablet TAKE 1 TABLET (40 MG TOTAL) BY MOUTH DAILY. 30 tablet 3 07/01/2017  . PARoxetine (PAXIL) 30 MG tablet TAKE 1 TABLET BY MOUTH EVERY DAY 90 tablet 0 07/01/2017  . potassium chloride (K-DUR,KLOR-CON) 10 MEQ tablet Take 20 mEq by mouth daily.    07/01/2017  . prazosin (MINIPRESS) 1 MG capsule Take 1 mg by mouth at bedtime.   07/01/2017  . QUEtiapine (SEROQUEL) 300 MG tablet TAKE 1 TABLET (300 MG TOTAL) BY MOUTH AT BEDTIME. 90 tablet 0 07/01/2017  . sacubitril-valsartan (ENTRESTO) 49-51 MG Take 1 tablet by mouth 2 (two) times daily. 60 tablet 5 07/01/2017  .  spironolactone (ALDACTONE) 25 MG tablet Take 1 tablet (25 mg total) by mouth daily. 90 tablet 3 07/01/2017    Current medications: Current Facility-Administered Medications  Medication Dose Route Frequency Provider Last Rate Last Dose  . acetaminophen (TYLENOL) tablet 650 mg  650 mg Oral Q6H PRN Arnaldo Natal, MD   650 mg at 07/03/17 0158  . atorvastatin (LIPITOR) tablet 40 mg  40 mg Oral Daily Arnaldo Natal, MD      . dicyclomine (BENTYL) tablet 20 mg  20 mg Oral TID PRN Arnaldo Natal, MD      . docusate sodium (COLACE) capsule 100 mg  100 mg Oral BID Arnaldo Natal, MD      . heparin injection 5,000 Units  5,000 Units Subcutaneous Q8H Arnaldo Natal, MD   5,000 Units at 07/03/17 0159  . insulin aspart (novoLOG) injection 0-15 Units  0-15 Units Subcutaneous TID WC Merwyn Katos, MD      . insulin aspart (novoLOG) injection 0-5 Units  0-5  Units Subcutaneous QHS Merwyn Katos, MD      . insulin glargine (LANTUS) injection 30 Units  30 Units Subcutaneous Daily Merwyn Katos, MD      . LORazepam (ATIVAN) tablet 0.5 mg  0.5 mg Oral Q8H PRN Arnaldo Natal, MD      . nitroGLYCERIN (NITROSTAT) SL tablet 0.4 mg  0.4 mg Sublingual Q5 min PRN Arnaldo Natal, MD      . ondansetron [facility]) injection 4 mg  4 mg Intravenous Q6H PRN Arnaldo Natal, MD      . pantoprazole (PROTONIX) EC tablet 40 mg  40 mg Oral Daily Arnaldo Natal, MD      . PARoxetine (PAXIL) tablet 30 mg  30 mg Oral Daily Arnaldo Natal, MD      . QUEtiapine (SEROQUEL) tablet 300 mg  300 mg Oral QHS Arnaldo Natal, MD   300 mg at 07/03/17 0158      Allergies: Allergies  Allergen Reactions  . Levothyroxine Rash      Past Medical History: Past Medical History:  Diagnosis Date  . ADHD   . Arthritis   . Asthma   . Bipolar 1 disorder (HCC)   . CHF (congestive heart failure) (HCC)   . Cirrhosis of liver (HCC)   . Coronary artery disease   . Depression   . Diabetes mellitus without complication (HCC)   . Diverticulitis   . Hypertension   . IBS (irritable bowel syndrome)   . Insomnia   . Migraines   . PTSD (post-traumatic stress disorder)   . Restless leg syndrome   . Sleep apnea   . Vertigo      Past Surgical History: Past Surgical History:  Procedure Laterality Date  . ABDOMINAL HYSTERECTOMY    . debribalator  2016  . INSERT / REPLACE / REMOVE PACEMAKER    . INSERTION OF ICD    . TONSILECTOMY, ADENOIDECTOMY, BILATERAL MYRINGOTOMY AND TUBES    . TONSILLECTOMY    . TONSILLECTOMY    . TUBAL LIGATION  1980  . TUBAL LIGATION       Family History: Family History  Problem Relation Age of Onset  . Hypertension Mother   . Hyperlipidemia Mother   . Heart disease Mother   . Hypertension Sister   . Asthma Sister   . Heart disease Sister   . Diabetes Sister   . Cancer Sister   . Alzheimer's disease Maternal Grandfather    .  Hyperlipidemia Brother   . Asthma Sister   . Hypertension Sister   . Diabetes Sister      Social History: Social History   Socioeconomic History  . Marital status: Single    Spouse name: Not on file  . Number of children: Not on file  . Years of education: Not on file  . Highest education level: Not on file  Social Needs  . Financial resource strain: Not on file  . Food insecurity - worry: Not on file  . Food insecurity - inability: Not on file  . Transportation needs - medical: Not on file  . Transportation needs - non-medical: Not on file  Occupational History  . Not on file  Tobacco Use  . Smoking status: Former Games developer  . Smokeless tobacco: Never Used  Substance and Sexual Activity  . Alcohol use: No  . Drug use: Yes    Types: Marijuana    Comment: last smoked marijuana earlier today  . Sexual activity: No    Birth control/protection: Surgical  Other Topics Concern  . Not on file  Social History Narrative  . Not on file     Review of Systems: Review of Systems  Constitutional: Positive for malaise/fatigue. Negative for chills, fever and weight loss.  HENT: Negative for hearing loss, nosebleeds and tinnitus.   Eyes: Negative for blurred vision and double vision.  Respiratory: Negative for cough and hemoptysis.   Cardiovascular: Negative for chest pain, palpitations and orthopnea.  Gastrointestinal: Positive for nausea and vomiting. Negative for diarrhea and heartburn.  Genitourinary: Negative for dysuria and urgency.  Musculoskeletal: Positive for myalgias.  Skin: Negative for itching and rash.  Neurological: Positive for dizziness and weakness. Negative for focal weakness.  Endo/Heme/Allergies: Positive for polydipsia. Does not bruise/bleed easily.  Psychiatric/Behavioral: Negative for memory loss. The patient is nervous/anxious.      Vital Signs: Blood pressure (!) 83/56, pulse 91, temperature 97.8 F (36.6 C), temperature source Oral, resp. rate  (!) 9, height 5\' 5"  (1.651 m), weight 83.4 kg (183 lb 13.8 oz), SpO2 99 %.  Weight trends: Filed Weights   07/02/17 1659 07/03/17 0111  Weight: 81.6 kg (180 lb) 83.4 kg (183 lb 13.8 oz)    Physical Exam: General: NAD, resting in bed  Head: Normocephalic, atraumatic.  Eyes: Anicteric, EOMI  Nose: Mucous membranes moist, not inflammed, nonerythematous.  Throat: Oropharynx nonerythematous, no exudate appreciated.   Neck: Supple, trachea midline.  Lungs:  Normal respiratory effort. Clear to auscultation BL without crackles or wheezes.  Heart: RRR. S1 and S2 normal without gallop, murmur, or rubs.  Abdomen:  BS normoactive. Soft, Nondistended, non-tender.  No masses or organomegaly.  Extremities: No pretibial edema.  Neurologic: A&O X3, Motor strength is 5/5 in the all 4 extremities  Skin: piercings noted    Lab results: Basic Metabolic Panel: Recent Labs  Lab 07/02/17 1656 07/03/17 0342 07/03/17 1011  NA 120* 132* 131*  K 4.3 3.0* 3.1*  CL 76* 89* 90*  CO2 24 28 24   GLUCOSE 680* 138* 246*  BUN 114* 89* 104*  CREATININE 5.45* 4.42* 5.14*  CALCIUM 10.1 9.9 9.4    Liver Function Tests: Recent Labs  Lab 07/02/17 1656  AST 18  ALT 17  ALKPHOS 157*  BILITOT 1.3*  PROT 10.0*  ALBUMIN 4.9   Recent Labs  Lab 07/02/17 1656  LIPASE 162*   No results for input(s): AMMONIA in the last 168 hours.  CBC: Recent Labs  Lab 07/02/17 1656  WBC 6.0  HGB 12.9  HCT 37.6  MCV 90.2  PLT 232    Cardiac Enzymes: Recent Labs  Lab 07/02/17 1656 07/03/17 0342  TROPONINI 0.04* 0.06*    BNP: Invalid input(s): POCBNP  CBG: Recent Labs  Lab 07/03/17 0606 07/03/17 0655 07/03/17 0717 07/03/17 0830 07/03/17 0927  GLUCAP 192* 187* 185* 165* 177*    Microbiology: Results for orders placed or performed during the hospital encounter of 07/02/17  MRSA PCR Screening     Status: None   Collection Time: 07/03/17 12:59 AM  Result Value Ref Range Status   MRSA by PCR  NEGATIVE NEGATIVE Final    Comment:        The GeneXpert MRSA Assay (FDA approved for NASAL specimens only), is one component of a comprehensive MRSA colonization surveillance program. It is not intended to diagnose MRSA infection nor to guide or monitor treatment for MRSA infections.     Coagulation Studies: No results for input(s): LABPROT, INR in the last 72 hours.  Urinalysis: Recent Labs    07/02/17 1656  COLORURINE YELLOW*  LABSPEC 1.014  PHURINE 5.0  GLUCOSEU >=500*  HGBUR SMALL*  BILIRUBINUR NEGATIVE  KETONESUR NEGATIVE  PROTEINUR 30*  NITRITE NEGATIVE  LEUKOCYTESUR NEGATIVE      Imaging: Dg Chest 2 View  Result Date: 07/02/2017 CLINICAL DATA:  Chest pain.  Abdominal pain for 2 weeks. EXAM: CHEST  2 VIEW COMPARISON:  12/07/2016 FINDINGS: An ICD is unchanged. The cardiomediastinal silhouette is within normal limits. The lungs are well inflated and clear. No pleural effusion or pneumothorax is identified. No acute osseous abnormality is seen. IMPRESSION: No active cardiopulmonary disease. Electronically Signed   By: Sebastian AcheAllen  Grady M.D.   On: 07/02/2017 17:34   [place] Abdomen Limited Ruq  Result Date: 07/02/2017 CLINICAL DATA:  51 year old female with right upper quadrant abdominal pain and vomiting. EXAM: ULTRASOUND ABDOMEN LIMITED RIGHT UPPER QUADRANT COMPARISON:  Abdominal CT dated 12/08/2016 FINDINGS: Gallbladder: No gallstones or wall thickening visualized. No sonographic Murphy sign noted by sonographer. Common bile duct: Diameter: 5 mm Liver: Grossly unremarkable as visualized. Portal vein is patent on color Doppler imaging with normal direction of blood flow towards the liver. IMPRESSION: Unremarkable right upper quadrant ultrasound. Electronically Signed   By: Elgie CollardArash  Radparvar M.D.   On: 07/02/2017 20:05      Assessment & Plan: Pt is a 51 y.o. female with a PMHx of osteoarthritis, bipolar disorder, congestive heart failure, cirrhosis of the liver, depression,  diabetes mellitus type 2, hypertension, irritable bowel syndrome, PTSD, restless leg syndrome, obstructive sleep apnea, who was admitted to [facility] on 07/02/2017 for evaluation of dizziness.   1.  Severe acute renal failure due to severe dehydration from uncontrolled diabetes mellitus type 2, use of BC powders, hypotension. 2.  Chronic kidney disease stage III baseline Cr 1.9 3.  Hypokalemia. 4.  Hypotension.  Plan:  We are asked to see the patient for evaluation management of acute renal failure in the setting of chronic kidney disease stage III.  Acute renal failure now secondary to severe dehydration from uncontrolled blood sugars, hypotension, and use of BC powders.  Her renal function was initially improving though her most recent BMP showed a worsening kidney function.  No urgent indication to perform dialysis but we may need to consider this if urine output is found to be low.  For now continue IV fluid hydration with 0.9 normal saline at 75 cc/h.  We will need to monitor her cardiopulmonary status closely as she does have  history of severe congestive heart failure with an ejection fraction of 20-25% on last echocardiogram.  However repeat echocardiogram has been completed and we are awaiting results of this.  Serum potassium noted to be low and patient is to receive potassium repletion today.  Management of hypotension as per pulmonary/critical care otherwise.   We will also order renal US for further evaluation. Thanks for consultation.

## 2017-07-03 NOTE — H&P (Signed)
Bonnie Ochoa is an 51 y.o. female.   Chief Complaint: Dizziness HPI: The patient with past medical history of CHF, CAD, diabetes and cirrhosis presented to the emergency department complaining of dizziness.  The patient states that she is also been nauseated and having abdominal pain that radiates into her chest throughout the day.  She denies shortness of breath, palpitations or diaphoresis.  Laboratory evaluation revealed significantly elevated blood sugar as well as significant decline in her kidney function.  An insulin drip was initiated in the emergency department prior to the hospitalist service being called for further management.  Past Medical History:  Diagnosis Date  . ADHD   . Arthritis   . Asthma   . Bipolar 1 disorder ([place])   . CHF (congestive heart failure) ([place])   . Cirrhosis of liver ([place])   . Coronary artery disease   . Depression   . Diabetes mellitus without complication ([place])   . Diverticulitis   . Hypertension   . IBS (irritable bowel syndrome)   . Insomnia   . Migraines   . PTSD (post-traumatic stress disorder)   . Restless leg syndrome   . Sleep apnea   . Vertigo     Past Surgical History:  Procedure Laterality Date  . ABDOMINAL HYSTERECTOMY    . debribalator  2016  . INSERT / REPLACE / REMOVE PACEMAKER    . INSERTION OF ICD    . TONSILECTOMY, ADENOIDECTOMY, BILATERAL MYRINGOTOMY AND TUBES    . TONSILLECTOMY    . TONSILLECTOMY    . TUBAL LIGATION  1980  . TUBAL LIGATION      Family History  Problem Relation Age of Onset  . Hypertension Mother   . Hyperlipidemia Mother   . Heart disease Mother   . Hypertension Sister   . Asthma Sister   . Heart disease Sister   . Diabetes Sister   . Cancer Sister   . Alzheimer's disease Maternal Grandfather   . Hyperlipidemia Brother   . Asthma Sister   . Hypertension Sister   . Diabetes Sister    Social History:  reports that she has quit smoking. she has never used smokeless tobacco. She reports that  she uses drugs. Drug: Marijuana. She reports that she does not drink alcohol.  Allergies:  Allergies  Allergen Reactions  . Levothyroxine Rash    Medications Prior to Admission  Medication Sig Dispense Refill  . atorvastatin (LIPITOR) 40 MG tablet Take 1 tablet (40 mg total) by mouth daily. 90 tablet 3  . carvedilol (COREG) 6.25 MG tablet Take 1 tablet (6.25 mg total) by mouth 2 (two) times daily with a meal. 180 tablet 3  . dicyclomine (BENTYL) 20 MG tablet TAKE 1 TABLET (20 MG TOTAL) BY MOUTH 3 (THREE) TIMES DAILY AS NEEDED FOR SPASMS. 90 tablet 3  . furosemide (LASIX) 40 MG tablet Take 60 mg by mouth 2 (two) times daily.    Marland Kitchen KLOR-CON M20 20 MEQ tablet TAKE 1 TABLET (20 MEQ TOTAL) BY MOUTH DAILY. 30 tablet 0  . LORazepam (ATIVAN) 0.5 MG tablet Take 0.5 mg by mouth every 8 (eight) hours as needed for anxiety.    . nitroGLYCERIN (NITROSTAT) 0.4 MG SL tablet Place 1 tablet (0.4 mg total) under the tongue every 5 (five) minutes as needed for chest pain. 30 tablet 12  . ondansetron (ZOFRAN ODT) 4 MG disintegrating tablet Take 1 tablet (4 mg total) by mouth every 8 (eight) hours as needed for nausea or vomiting. 20 tablet 0  .  pantoprazole (PROTONIX) 40 MG tablet TAKE 1 TABLET (40 MG TOTAL) BY MOUTH DAILY. 30 tablet 3  . PARoxetine (PAXIL) 30 MG tablet TAKE 1 TABLET BY MOUTH EVERY DAY 90 tablet 0  . potassium chloride (K-DUR,KLOR-CON) 10 MEQ tablet Take 20 mEq by mouth daily.     . prazosin (MINIPRESS) 1 MG capsule Take 1 mg by mouth at bedtime.    Marland Kitchen QUEtiapine (SEROQUEL) 300 MG tablet TAKE 1 TABLET (300 MG TOTAL) BY MOUTH AT BEDTIME. 90 tablet 0  . sacubitril-valsartan (ENTRESTO) 49-51 MG Take 1 tablet by mouth 2 (two) times daily. 60 tablet 5  . spironolactone (ALDACTONE) 25 MG tablet Take 1 tablet (25 mg total) by mouth daily. 90 tablet 3    Results for orders placed or performed during the hospital encounter of 07/02/17 (from the past 48 hour(s))  Lipase, blood     Status: Abnormal    Collection Time: 07/02/17  4:56 PM  Result Value Ref Range   Lipase 162 (H) 11 - 51 U/L  Comprehensive metabolic panel     Status: Abnormal   Collection Time: 07/02/17  4:56 PM  Result Value Ref Range   Sodium 120 (L) 135 - 145 mmol/L   Potassium 4.3 3.5 - 5.1 mmol/L   Chloride 76 (L) 101 - 111 mmol/L   CO2 24 22 - 32 mmol/L   Glucose, Bld 680 (HH) 65 - 99 mg/dL    Comment: CRITICAL RESULT CALLED TO, READ BACK BY AND VERIFIED WITH: JANE RYAN @ 1746 ON 07/02/2017 BY CAF    BUN 114 (H) 6 - 20 mg/dL    Comment: RESULTS CONFIRMED BY MANUAL DILUTION BY CAF    Creatinine, Ser 5.45 (H) 0.44 - 1.00 mg/dL   Calcium 10.1 8.9 - 10.3 mg/dL   Total Protein 10.0 (H) 6.5 - 8.1 g/dL   Albumin 4.9 3.5 - 5.0 g/dL   AST 18 15 - 41 U/L   ALT 17 14 - 54 U/L   Alkaline Phosphatase 157 (H) 38 - 126 U/L   Total Bilirubin 1.3 (H) 0.3 - 1.2 mg/dL   GFR calc non Af Amer 8 (L) >60 mL/min   GFR calc Af Amer 9 (L) >60 mL/min    Comment: (NOTE) The eGFR has been calculated using the CKD EPI equation. This calculation has not been validated in all clinical situations. eGFR's persistently <60 mL/min signify possible Chronic Kidney Disease.    Anion gap 20 (H) 5 - 15  CBC     Status: None   Collection Time: 07/02/17  4:56 PM  Result Value Ref Range   WBC 6.0 3.6 - 11.0 K/uL   RBC 4.17 3.80 - 5.20 MIL/uL   Hemoglobin 12.9 12.0 - 16.0 g/dL   HCT 37.6 35.0 - 47.0 %   MCV 90.2 80.0 - 100.0 fL   MCH 30.9 26.0 - 34.0 pg   MCHC 34.2 32.0 - 36.0 g/dL   RDW 13.0 11.5 - 14.5 %   Platelets 232 150 - 440 K/uL  Urinalysis, Complete w Microscopic     Status: Abnormal   Collection Time: 07/02/17  4:56 PM  Result Value Ref Range   Color, Urine YELLOW (A) YELLOW   APPearance CLEAR (A) CLEAR   Specific Gravity, Urine 1.014 1.005 - 1.030   pH 5.0 5.0 - 8.0   Glucose, UA >=500 (A) NEGATIVE mg/dL   Hgb urine dipstick SMALL (A) NEGATIVE   Bilirubin Urine NEGATIVE NEGATIVE   Ketones, ur NEGATIVE NEGATIVE mg/dL  Protein, ur 30 (A) NEGATIVE mg/dL   Nitrite NEGATIVE NEGATIVE   Leukocytes, UA NEGATIVE NEGATIVE   RBC / HPF 0-5 0 - 5 RBC/hpf   WBC, UA 0-5 0 - 5 WBC/hpf   Bacteria, UA NONE SEEN NONE SEEN   Squamous Epithelial / LPF 0-5 (A) NONE SEEN   Mucus PRESENT   Troponin I     Status: Abnormal   Collection Time: 07/02/17  4:56 PM  Result Value Ref Range   Troponin I 0.04 (HH) <0.03 ng/mL    Comment: CRITICAL RESULT CALLED TO, READ BACK BY AND VERIFIED WITH JANE RYAN @ 1800 ON 07/02/2017 BY CAF   Beta-hydroxybutyric acid     Status: None   Collection Time: 07/02/17  4:56 PM  Result Value Ref Range   Beta-Hydroxybutyric Acid 0.19 0.05 - 0.27 mmol/L  TSH     Status: None   Collection Time: 07/02/17  4:56 PM  Result Value Ref Range   TSH 2.888 0.350 - 4.500 uIU/mL    Comment: Performed by a 3rd Generation assay with a functional sensitivity of <=0.01 uIU/mL.  Glucose, capillary     Status: Abnormal   Collection Time: 07/02/17  8:45 PM  Result Value Ref Range   Glucose-Capillary >600 (HH) 65 - 99 mg/dL  Glucose, capillary     Status: Abnormal   Collection Time: 07/02/17  8:46 PM  Result Value Ref Range   Glucose-Capillary >600 (HH) 65 - 99 mg/dL   Comment 1 Notify RN    Comment 2 Document in Chart   Glucose, capillary     Status: Abnormal   Collection Time: 07/02/17  9:50 PM  Result Value Ref Range   Glucose-Capillary 581 (HH) 65 - 99 mg/dL   Comment 1 Document in Chart   Glucose, capillary     Status: Abnormal   Collection Time: 07/02/17 10:52 PM  Result Value Ref Range   Glucose-Capillary >600 (HH) 65 - 99 mg/dL   Comment 1 Notify RN    Comment 2 Document in Chart   Glucose, capillary     Status: Abnormal   Collection Time: 07/02/17 10:52 PM  Result Value Ref Range   Glucose-Capillary 530 (HH) 65 - 99 mg/dL   Comment 1 Notify RN    Comment 2 Document in Chart   Glucose, capillary     Status: Abnormal   Collection Time: 07/03/17 12:01 AM  Result Value Ref Range    Glucose-Capillary 381 (H) 65 - 99 mg/dL  Glucose, capillary     Status: Abnormal   Collection Time: 07/03/17  1:02 AM  Result Value Ref Range   Glucose-Capillary 268 (H) 65 - 99 mg/dL  Glucose, capillary     Status: Abnormal   Collection Time: 07/03/17  2:03 AM  Result Value Ref Range   Glucose-Capillary 185 (H) 65 - 99 mg/dL   Dg Chest 2 View  Result Date: 07/02/2017 CLINICAL DATA:  Chest pain.  Abdominal pain for 2 weeks. EXAM: CHEST  2 VIEW COMPARISON:  12/07/2016 FINDINGS: An ICD is unchanged. The cardiomediastinal silhouette is within normal limits. The lungs are well inflated and clear. No pleural effusion or pneumothorax is identified. No acute osseous abnormality is seen. IMPRESSION: No active cardiopulmonary disease. Electronically Signed   By: Logan Bores M.D.   On: 07/02/2017 17:34   US Abdomen Limited Ruq  Result Date: 07/02/2017 CLINICAL DATA:  51 year old female with right upper quadrant abdominal pain and vomiting. EXAM: ULTRASOUND ABDOMEN LIMITED RIGHT UPPER QUADRANT COMPARISON:  Abdominal CT dated 12/08/2016 FINDINGS: Gallbladder: No gallstones or wall thickening visualized. No sonographic Murphy sign noted by sonographer. Common bile duct: Diameter: 5 mm Liver: Grossly unremarkable as visualized. Portal vein is patent on color Doppler imaging with normal direction of blood flow towards the liver. IMPRESSION: Unremarkable right upper quadrant ultrasound. Electronically Signed   By: Anner Crete M.D.   On: 07/02/2017 20:05    Review of Systems  Constitutional: Negative for chills and fever.  HENT: Negative for sore throat and tinnitus.   Eyes: Negative for blurred vision and redness.  Respiratory: Negative for cough and shortness of breath.   Cardiovascular: Negative for chest pain, palpitations, orthopnea and PND.  Gastrointestinal: Positive for abdominal pain and nausea. Negative for diarrhea and vomiting.  Genitourinary: Negative for dysuria, frequency and urgency.   Musculoskeletal: Negative for joint pain and myalgias.  Skin: Negative for rash.       No lesions  Neurological: Positive for dizziness and weakness. Negative for speech change and focal weakness.  Endo/Heme/Allergies: Does not bruise/bleed easily.       No temperature intolerance  Psychiatric/Behavioral: Negative for depression and suicidal ideas.    Blood pressure 96/74, pulse 90, temperature 99 F (37.2 C), temperature source Oral, resp. rate 20, height _0  (1.651 m), weight 83.4 kg (183 lb 13.8 oz), SpO2 99 %. Physical Exam  Vitals reviewed. Constitutional: She is oriented to person, place, and time. She appears well-developed and well-nourished. No distress.  HENT:  Head: Normocephalic and atraumatic.  Mouth/Throat: Oropharynx is clear and moist.  Eyes: Conjunctivae and EOM are normal. Pupils are equal, round, and reactive to light. No scleral icterus.  Neck: Normal range of motion. Neck supple. No JVD present. No tracheal deviation present. No thyromegaly present.  Cardiovascular: Normal rate, regular rhythm and normal heart sounds. Exam reveals no gallop and no friction rub.  No murmur heard. Respiratory: Effort normal and breath sounds normal.  GI: Soft. Bowel sounds are normal. She exhibits distension and ascites. There is no tenderness.  Genitourinary:  Genitourinary Comments: Deferred  Musculoskeletal: Normal range of motion. She exhibits no edema.  Lymphadenopathy:    She has no cervical adenopathy.  Neurological: She is alert and oriented to person, place, and time. No cranial nerve deficit. She exhibits normal muscle tone.  Skin: Skin is warm and dry. No rash noted. No erythema.  Psychiatric: She has a normal mood and affect. Her behavior is normal. Judgment and thought content normal.     Assessment/Plan This is a 51 year old female admitted for hyperglycemic hyperosmolar nonketotic coma. 1.  HHNC: Started on insulin drip and intravenous fluid.  Monitor blood  sugars. 2.  CHF: Chronic; systolic.  As the patient has renal failure we will have to maintain a slightly positive fluid balance.  Continue diuretic therapy while hydrating with intravenous fluid.  Hold Entresto secondary to renal failure.  Consult cardiology. BP is soft; hold anti-hypertensive meds as needed 3.  Acute on chronic kidney injury: Consult nephrology has the patient has progressed to end-stage renal disease.  Monitor urine output closely. 4.  Hyponatremia: Pseudo-; secondary to hyperglycemia 5.  Cirrhosis: continue Lasix (IV) and spironolactone 6.  Depression: continue Paxil and Seroquel  7.  DVT prophylaxis: heparin 8.  GI prophylaxis: pantoprazole per home regimen The patient is a full code.  Time spent on admission orders and critical care approximately 45 minutes.  Discussed with [facility]-Link telemedicine   Harrie Foreman, MD 07/03/2017, 2:38 AM

## 2017-07-03 NOTE — Progress Notes (Signed)
eLink Physician-Brief Progress Note Patient Name: Bonnie Ochoa DOB: 1965/10/31 MRN: [id number]   Date of Service  07/03/2017  HPI/Events of Note  Pt admitted with hyperglycemia, with acidosis, ARF.   eICU Interventions  Continue IVF, insulin drip, monitor renal function and UOP.         Shane Crutch 07/03/2017, 1:19 AM

## 2017-07-03 NOTE — Progress Notes (Addendum)
[employer] [facility] [facility] [place] [facility]   PATIENT NAME: Wilodean Purohit    MR#:  [id number]  DATE OF BIRTH:  03-21-1966  SUBJECTIVE:  CHIEF COMPLAINT:   Chief Complaint  Patient presents with  . Abdominal Pain  . Dizziness  . Chest Pain  feels dizzy, still very hypotensive, BP was in 70s when I saw her REVIEW OF SYSTEMS:  Review of Systems  Constitutional: Negative for chills, fever and weight loss.  HENT: Negative for nosebleeds and sore throat.   Eyes: Negative for blurred vision.  Respiratory: Negative for cough, shortness of breath and wheezing.   Cardiovascular: Negative for chest pain, orthopnea, leg swelling and PND.  Gastrointestinal: Negative for abdominal pain, constipation, diarrhea, heartburn, nausea and vomiting.  Genitourinary: Negative for dysuria and urgency.  Musculoskeletal: Negative for back pain.  Skin: Negative for rash.  Neurological: Positive for dizziness. Negative for speech change, focal weakness and headaches.  Endo/Heme/Allergies: Does not bruise/bleed easily.  Psychiatric/Behavioral: Negative for depression.    DRUG ALLERGIES:   Allergies  Allergen Reactions  . Levothyroxine Rash   VITALS:  Blood pressure 96/71, pulse 83, temperature 97.8 F (36.6 C), temperature source Oral, resp. rate (!) 9, height 5\' 5"  (1.651 m), weight 83.4 kg (183 lb 13.8 oz), SpO2 98 %. PHYSICAL EXAMINATION:  Physical Exam  Constitutional: She is oriented to person, place, and time and well-developed, well-nourished, and in no distress.  HENT:  Head: Normocephalic and atraumatic.  Eyes: Conjunctivae and EOM are normal. Pupils are equal, round, and reactive to light.  Neck: Normal range of motion. Neck supple. No tracheal deviation present. No thyromegaly present.  Cardiovascular: Normal rate, regular rhythm and normal heart sounds.  Pulmonary/Chest: Effort normal and breath sounds normal. No respiratory distress. She has no wheezes. She exhibits no  tenderness.  Abdominal: Soft. Bowel sounds are normal. She exhibits no distension. There is no tenderness.  Musculoskeletal: Normal range of motion.  Neurological: She is alert and oriented to person, place, and time. No cranial nerve deficit.  Skin: Skin is warm and dry. No rash noted.  Psychiatric: Mood and affect normal.   LABORATORY PANEL:  Female CBC Recent Labs  Lab 07/02/17 1656  WBC 6.0  HGB 12.9  HCT 37.6  PLT 232   ------------------------------------------------------------------------------------------------------------------ Chemistries  Recent Labs  Lab 07/02/17 1656  07/03/17 1011  NA 120*   < > 131*  K 4.3   < > 3.1*  CL 76*   < > 90*  CO2 24   < > 24  GLUCOSE 680*   < > 246*  BUN 114*   < > 104*  CREATININE 5.45*   < > 5.14*  CALCIUM 10.1   < > 9.4  AST 18  --   --   ALT 17  --   --   ALKPHOS 157*  --   --   BILITOT 1.3*  --   --    < > = values in this interval not displayed.   RADIOLOGY:  Dg Chest 2 View  Result Date: 07/02/2017 CLINICAL DATA:  Chest pain.  Abdominal pain for 2 weeks. EXAM: CHEST  2 VIEW COMPARISON:  12/07/2016 FINDINGS: An ICD is unchanged. The cardiomediastinal silhouette is within normal limits. The lungs are well inflated and clear. No pleural effusion or pneumothorax is identified. No acute osseous abnormality is seen. IMPRESSION: No active cardiopulmonary disease. Electronically Signed   By: Sebastian Ache M.D.   On: 07/02/2017 17:34   US  Renal  Result Date: 07/03/2017 CLINICAL DATA:  Acute renal failure. EXAM: RENAL / URINARY TRACT ULTRASOUND COMPLETE COMPARISON:  CT abdomen pelvis dated December 08, 2016. FINDINGS: Right Kidney: Length: 11.0 cm. Echogenicity within normal limits. No mass or hydronephrosis visualized. Left Kidney: Length: 11.2 cm. Echogenicity within normal limits. No mass or hydronephrosis visualized. Bladder: Appears normal for degree of bladder distention. IMPRESSION: Normal renal ultrasound. Electronically Signed    By: Obie DredgeWilliam T Derry M.D.   On: 07/03/2017 12:10   [place] Abdomen Limited Ruq  Result Date: 07/02/2017 CLINICAL DATA:  51 year old female with right upper quadrant abdominal pain and vomiting. EXAM: ULTRASOUND ABDOMEN LIMITED RIGHT UPPER QUADRANT COMPARISON:  Abdominal CT dated 12/08/2016 FINDINGS: Gallbladder: No gallstones or wall thickening visualized. No sonographic Murphy sign noted by sonographer. Common bile duct: Diameter: 5 mm Liver: Grossly unremarkable as visualized. Portal vein is patent on color Doppler imaging with normal direction of blood flow towards the liver. IMPRESSION: Unremarkable right upper quadrant ultrasound. Electronically Signed   By: Elgie CollardArash  Radparvar M.D.   On: 07/02/2017 20:05   ASSESSMENT AND PLAN:  51 y.o. female with a PMHx of osteoarthritis, bipolar disorder, congestive heart failure, cirrhosis of the liver, depression, diabetes mellitus type 2, hypertension, irritable bowel syndrome, PTSD, restless leg syndrome, obstructive sleep apnea, [facility] for evaluation of dizziness and hyperglycemic hyperosmolar nonketotic coma..   1. Hyperglycemic hyperosmolar nonketotic coma: Transitioning off insulin drip to Lantus 30 units and Novolog moderate tid with meals and HS - DM Nurse following 2.  CHF: Chronic; systolic.  As the patient has renal failure we will have to maintain a slightly positive fluid balance.  Continue diuretic therapy while hydrating with intravenous fluid.  Hold Entresto secondary to renal failure.  Appreiate cardiology input. BP is soft; hold anti-hypertensive meds as needed 3. Severe acute on chronic renal failure III (baseline Cr 1.9) due to severe dehydration from uncontrolled diabetes mellitus type 2, use of BC powders, hypotension: Appreciate nephrology input, monitor 4.  Hyponatremia: Pseudo-; secondary to hyperglycemia 5.  Cirrhosis: continue Lasix (IV) and spironolactone as BP allows 6.  Depression: continue Paxil and Seroquel    7. Hyperglycemia: -Improving -Likely from dietary and medication noncompliance  8. Elevated troponin: - due to supply demand ischemia  -Echo pending, Cardio seen    Still critically sick with severe hypotension - may need pressors, high risk for multiorgan failure,    All the records are reviewed and case discussed with Care Management/Social Worker. Management plans discussed with the patient, Nursing, PCCM and they are in agreement.  CODE STATUS: Full Code  TOTAL TIME (Critical Care) TAKING CARE OF THIS PATIENT: 35 minutes.   More than 50% of the time was spent in counseling/coordination of care: YES  POSSIBLE D/C IN 1-2 DAYS, DEPENDING ON CLINICAL CONDITION.   Delfino LovettVipul Dreux Mcgroarty M.D on 07/03/2017 at 3:11 PM  Between 7am to 6pm - Pager - [phone]  After 6pm go to [email or web address] - Social research officer, governmentpassword EPAS [email or web address]  [facility] [place] [facility]  Office  [phone]  CC: Primary care physician; Dorcas CarrowJohnson, Megan P, DO  Note: This dictation was prepared with Dragon dictation along with smaller phrase technology. Any transcriptional errors that result from this process are unintentional.

## 2017-07-03 NOTE — Progress Notes (Addendum)
Chaplain responded to OR for pt who wanted to complete Advanced Directive. [place] met pt and significant other at the bedside. [place] educated the pt on how to complete AD, pt requested time to review the material and will contact [place] when ready to complete AD. CH is available for a follow up as needed.

## 2017-07-03 NOTE — Progress Notes (Signed)
ELECTROLYTE CONSULT  Pharmacy Consult for electrolytes   Labs: Sodium (mmol/L)  Date Value  07/03/2017 129 (L)  02/20/2017 134   Potassium (mmol/L)  Date Value  07/03/2017 3.6   Magnesium (mg/dL)  Date Value  26/94/8546 2.6 (H)   Calcium (mg/dL)  Date Value  27/10/5007 9.2   Albumin (g/dL)  Date Value  38/18/2993 4.9  12/28/2016 4.1    Estimated Creatinine Clearance: 14.6 mL/min (A) (by C-G formula based on SCr of 4.74 mg/dL (H)).  Assessment: 51 yo female admitted with hyperglycemic hyperosmolar nonketotic coma. Pharmacy has been consulted to monitor and replace electrolytes as needed.   Electrolytes are currently within normal limits.   Goal of Therapy:  K = 3.5 - 5  Plan:  Electrolytes are currently within normal limits. No replacement is needed at this time. Pharmacy will continue to follow and adjust as needed.   Yolanda Bonine, PharmD Pharmacy Resident 07/03/2017,5:32 PM

## 2017-07-03 NOTE — Progress Notes (Signed)
Pt admitted to ICU early in the morning from ED on insulin gtt. Pt also started on maintaince fluids and closely monitored.

## 2017-07-03 NOTE — Progress Notes (Signed)
*  PRELIMINARY RESULTS* Echocardiogram 2D Echocardiogram has been performed.  Bonnie Ochoa 07/03/2017, 9:48 AM

## 2017-07-03 NOTE — Progress Notes (Signed)
Pharmacy dose adjustment: Sacubitril/Valsartan Patient is on sacubitril/valsartan 49/51 mg twice daily PTA. Patient has an eGFR of 9  Since patient's eGFR is < 30 will dose adjust entresto dose to 24/26 mg twice daily. Will start entresto 24/26 mg twice daily.   , PharmD, BCPS Clinical Pharmacist 07/03/2017  

## 2017-07-03 NOTE — Care Management (Signed)
Message left for PCP/[facility] requesting post hospitalization follow up- they will call this RNCM back with appointment.  Receptionist was not able to find an opening until the end of December.

## 2017-07-04 LAB — BASIC METABOLIC PANEL
Anion gap: 10 (ref 5–15)
BUN: 100 mg/dL — AB (ref 6–20)
CHLORIDE: 98 mmol/L — AB (ref 101–111)
CO2: 24 mmol/L (ref 22–32)
CREATININE: 3.63 mg/dL — AB (ref 0.44–1.00)
Calcium: 9.4 mg/dL (ref 8.9–10.3)
GFR calc Af Amer: 15 mL/min — ABNORMAL LOW (ref >60)
GFR calc non Af Amer: 13 mL/min — ABNORMAL LOW (ref >60)
Glucose, Bld: 316 mg/dL — ABNORMAL HIGH (ref 65–99)
Potassium: 3.5 mmol/L (ref 3.5–5.1)
SODIUM: 132 mmol/L — AB (ref 135–145)

## 2017-07-04 LAB — PROTEIN ELECTROPHORESIS, SERUM
A/G Ratio: 0.7 (ref 0.7–1.7)
ALPHA-1-GLOBULIN: 0.3 g/dL (ref 0.0–0.4)
Albumin ELP: 3.7 g/dL (ref 2.9–4.4)
Alpha-2-Globulin: 1.3 g/dL — ABNORMAL HIGH (ref 0.4–1.0)
Beta Globulin: 1.5 g/dL — ABNORMAL HIGH (ref 0.7–1.3)
GAMMA GLOBULIN: 2.5 g/dL — AB (ref 0.4–1.8)
Globulin, Total: 5.5 g/dL — ABNORMAL HIGH (ref 2.2–3.9)
TOTAL PROTEIN ELP: 9.2 g/dL — AB (ref 6.0–8.5)

## 2017-07-04 LAB — HEMOGLOBIN A1C

## 2017-07-04 LAB — GLUCOSE, CAPILLARY
Glucose-Capillary: 329 mg/dL — ABNORMAL HIGH (ref 65–99)
Glucose-Capillary: 296 mg/dL — ABNORMAL HIGH (ref 65–99)
Glucose-Capillary: 234 mg/dL — ABNORMAL HIGH (ref 65–99)
Glucose-Capillary: 297 mg/dL — ABNORMAL HIGH (ref 65–99)

## 2017-07-04 LAB — CBC
HCT: 31.9 % — ABNORMAL LOW (ref 35.0–47.0)
Hemoglobin: 11.2 g/dL — ABNORMAL LOW (ref 12.0–16.0)
MCH: 31.9 pg (ref 26.0–34.0)
MCHC: 35.1 g/dL (ref 32.0–36.0)
MCV: 90.8 fL (ref 80.0–100.0)
PLATELETS: 170 10*3/uL (ref 150–440)
RBC: 3.52 MIL/uL — ABNORMAL LOW (ref 3.80–5.20)
RDW: 13 % (ref 11.5–14.5)
WBC: 4.3 10*3/uL (ref 3.6–11.0)

## 2017-07-04 MED ORDER — LIVING WELL WITH DIABETES BOOK
Freq: Once | Status: AC
  Administered 2017-07-04: 04:00:00
  Filled 2017-07-04: qty 1

## 2017-07-04 MED ORDER — INSULIN GLARGINE 100 UNIT/ML ~~LOC~~ SOLN
40.0000 [IU] | Freq: Every day | SUBCUTANEOUS | Status: DC
  Administered 2017-07-05: 40 [IU] via SUBCUTANEOUS
  Filled 2017-07-04: qty 0.4

## 2017-07-04 MED ORDER — INSULIN GLARGINE 100 UNIT/ML ~~LOC~~ SOLN
10.0000 [IU] | Freq: Once | SUBCUTANEOUS | Status: AC
  Administered 2017-07-04: 10 [IU] via SUBCUTANEOUS
  Filled 2017-07-04: qty 0.1

## 2017-07-04 NOTE — Progress Notes (Signed)
Progress Note  Patient Name: Bonnie Ochoa Date of Encounter: 07/04/2017   Primary Cardiologist: [facility], Naleah Kofoed  Subjective   Reports that she feels great, denies abdominal bloating, dry mouth gone, no dizziness lightheadedness, walking to the bathroom without significant symptoms Telemetry reviewed showing normal sinus rhythm  Inpatient Medications    Scheduled Meds: . atorvastatin  40 mg Oral Daily  . docusate sodium  100 mg Oral BID  . heparin  5,000 Units Subcutaneous Q8H  . insulin aspart  0-15 Units Subcutaneous TID WC  . insulin aspart  0-5 Units Subcutaneous QHS  . [START ON 07/05/2017] insulin glargine  40 Units Subcutaneous Daily  . pantoprazole  40 mg Oral Daily  . PARoxetine  30 mg Oral Daily  . QUEtiapine  300 mg Oral QHS   Continuous Infusions: . sodium chloride 75 mL/hr at 07/04/17 1206   PRN Meds: acetaminophen **OR** [DISCONTINUED] acetaminophen, dicyclomine, LORazepam, nitroGLYCERIN, [DISCONTINUED] ondansetron **OR** ondansetron (ZOFRAN) IV   Vital Signs    Vitals:   07/03/17 2003 07/04/17 0441 07/04/17 0531 07/04/17 1300  BP: (!) 104/56 (!) 91/52  102/62  Pulse: 83 76  77  Resp: 20 20  19   Temp: 98.2 F (36.8 C) 98.1 F (36.7 C)  98.2 F (36.8 C)  TempSrc: Oral Oral  Oral  SpO2: 98% 99%  98%  Weight:   191 lb 6.4 oz (86.8 kg)   Height:        Intake/Output Summary (Last 24 hours) at 07/04/2017 1504 Last data filed at 07/04/2017 1358 Gross per 24 hour  Intake 2221 ml  Output 2300 ml  Net -79 ml   Filed Weights   07/02/17 1659 07/03/17 0111 07/04/17 0531  Weight: 180 lb (81.6 kg) 183 lb 13.8 oz (83.4 kg) 191 lb 6.4 oz (86.8 kg)    Telemetry    Normal sinus rhythm- Personally Reviewed  ECG      Physical Exam   GEN: No acute distress.   Neck: No JVD Cardiac: RRR, no murmurs, rubs, or gallops.  Respiratory: Clear to auscultation bilaterally. GI: Soft, nontender, non-distended  MS: No edema; No deformity. Neuro:  Nonfocal    Psych: Normal affect   Labs    Chemistry Recent Labs  Ochoa 07/02/17 1656  07/03/17 1011 07/03/17 1450 07/04/17 0528  NA 120*   < > 131* 129* 132*  K 4.3   < > 3.1* 3.6 3.5  CL 76*   < > 90* 89* 98*  CO2 24   < > 24 23 24   GLUCOSE 680*   < > 246* 393* 316*  BUN 114*   < > 104* 105* 100*  CREATININE 5.45*   < > 5.14* 4.74* 3.63*  CALCIUM 10.1   < > 9.4 9.2 9.4  PROT 10.0*  --   --   --   --   ALBUMIN 4.9  --   --   --   --   AST 18  --   --   --   --   ALT 17  --   --   --   --   ALKPHOS 157*  --   --   --   --   BILITOT 1.3*  --   --   --   --   GFRNONAA 8*   < > 9* 10* 13*  GFRAA 9*   < > 10* 11* 15*  ANIONGAP 20*   < > 17* 17* 10   < > = values  in this interval not displayed.     Hematology Recent Labs  Ochoa 07/02/17 1656 07/04/17 0528  WBC 6.0 4.3  RBC 4.17 3.52*  HGB 12.9 11.2*  HCT 37.6 31.9*  MCV 90.2 90.8  MCH 30.9 31.9  MCHC 34.2 35.1  RDW 13.0 13.0  PLT 232 170    Cardiac Enzymes Recent Labs  Ochoa 07/02/17 1656 07/03/17 0342  TROPONINI 0.04* 0.06*   No results for input(s): TROPIPOC in the last 168 hours.   BNPNo results for input(s): BNP, PROBNP in the last 168 hours.   DDimer No results for input(s): DDIMER in the last 168 hours.   Radiology    Dg Chest 2 View  Result Date: 07/02/2017 CLINICAL DATA:  Chest pain.  Abdominal pain for 2 weeks. EXAM: CHEST  2 VIEW COMPARISON:  12/07/2016 FINDINGS: An ICD is unchanged. The cardiomediastinal silhouette is within normal limits. The lungs are well inflated and clear. No pleural effusion or pneumothorax is identified. No acute osseous abnormality is seen. IMPRESSION: No active cardiopulmonary disease. Electronically Signed   By: Sebastian Ache M.D.   On: 07/02/2017 17:34   US Renal  Result Date: 07/03/2017 CLINICAL DATA:  Acute renal failure. EXAM: RENAL / URINARY TRACT ULTRASOUND COMPLETE COMPARISON:  CT abdomen pelvis dated December 08, 2016. FINDINGS: Right Kidney: Length: 11.0 cm. Echogenicity  within normal limits. No mass or hydronephrosis visualized. Left Kidney: Length: 11.2 cm. Echogenicity within normal limits. No mass or hydronephrosis visualized. Bladder: Appears normal for degree of bladder distention. IMPRESSION: Normal renal ultrasound. Electronically Signed   By: Obie Dredge M.D.   On: 07/03/2017 12:10   US Abdomen Limited Ruq  Result Date: 07/02/2017 CLINICAL DATA:  51 year old female with right upper quadrant abdominal pain and vomiting. EXAM: ULTRASOUND ABDOMEN LIMITED RIGHT UPPER QUADRANT COMPARISON:  Abdominal CT dated 12/08/2016 FINDINGS: Gallbladder: No gallstones or wall thickening visualized. No sonographic Murphy sign noted by sonographer. Common bile duct: Diameter: 5 mm Liver: Grossly unremarkable as visualized. Portal vein is patent on color Doppler imaging with normal direction of blood flow towards the liver. IMPRESSION: Unremarkable right upper quadrant ultrasound. Electronically Signed   By: Elgie Collard M.D.   On: 07/02/2017 20:05    Cardiac Studies   Echocardiogram, ejection fraction 20-25% Reviewed personally by myself  Patient Profile     Bonnie Ochoa is a pleasant 51 year old woman arrived from [place] In 2017 with notes in the computer detailing history of Medical and appt noncompliance,Substance abuse-chronic /marijuana bipolar, anxiety diabetes, hypertension,  asthma,  Obstructive sleep apnea, restless leg syndrome Nonischemic cardiomyopathy, history of ICD,  ejection fraction 25% anemia Previous alcohol problem (patient denies any coronary disease on prior cardiac catheterization) ascites, cirrhosis,History of paracentesis Previously on pain medication at home, oxycodone She presents to the hospital with dizziness, abdominal pain, chest pain  Assessment & Plan    1) Acute renal failure Further improvement of renal function with IV fluids On arrival with hypotension, dry mouth, tremendous urination secondary to glucose  level 600 Diuretics on hold Renal following Cardiac meds on hold  2) diabetes, poorly controlled Diet noncompliance, drinking large amounts of Sprite, Fast food Education process started Family typically takes care of her, patient relatively noncompliant  3) cirrhosis Prior history of alcohol,  Ascites, appears stable  4) nonischemic cardiomyopathy Recommended alcohol cessation, avoiding drugs of abuse Once renal function improves and blood pressure stable, would slowly restart entresto, Aldactone, beta blocker    Total encounter time more than 25 minutes  Greater  than 50% was spent in counseling and coordination of care with the patient   For questions or updates, please contact [facility] Please consult [email or web address] for contact info under Cardiology/STEMI.      Signed, Julien Nordmann, MD  07/04/2017, 3:04 PM

## 2017-07-04 NOTE — Progress Notes (Signed)
ELECTROLYTE CONSULT  Pharmacy Consult for electrolytes   Labs: Sodium (mmol/L)  Date Value  07/04/2017 132 (L)  02/20/2017 134   Potassium (mmol/L)  Date Value  07/04/2017 3.5   Magnesium (mg/dL)  Date Value  88/06/314 2.6 (H)   Calcium (mg/dL)  Date Value  94/58/5929 9.4   Albumin (g/dL)  Date Value  24/46/2863 4.9  12/28/2016 4.1    Estimated Creatinine Clearance: 19.5 mL/min (A) (by C-G formula based on SCr of 3.63 mg/dL (H)).  Assessment: 51 yo female admitted with hyperglycemic hyperosmolar nonketotic coma. Pharmacy has been consulted to monitor and replace electrolytes as needed.   Electrolytes are currently within normal limits.   Goal of Therapy:  K = 3.5 - 5  Plan:  Electrolytes are currently within normal limits. No replacement is needed at this time. Pharmacy will continue to follow and adjust as needed.   Demetrius Charity, PharmD  07/04/2017,8:16 AM

## 2017-07-04 NOTE — Progress Notes (Signed)
[facility]  ROUNDING NOTE   Subjective:  Renal function is slowly improving. Creatinine down to 3.63. Good urine output at 1500 mL over the preceding 24 hours. Patient moved over to floor care.   Objective:  Vital signs in last 24 hours:  Temp:  [98.1 F (36.7 C)-98.4 F (36.9 C)] 98.1 F (36.7 C) (11/22 0441) Pulse Rate:  [76-95] 76 (11/22 0441) Resp:  [9-20] 20 (11/22 0441) BP: (78-109)/(52-73) 91/52 (11/22 0441) SpO2:  [96 %-100 %] 99 % (11/22 0441) Weight:  [86.8 kg (191 lb 6.4 oz)] 86.8 kg (191 lb 6.4 oz) (11/22 0531)  Weight change: 5.171 kg (11 lb 6.4 oz) Filed Weights   07/02/17 1659 07/03/17 0111 07/04/17 0531  Weight: 81.6 kg (180 lb) 83.4 kg (183 lb 13.8 oz) 86.8 kg (191 lb 6.4 oz)    Intake/Output: I/O last 3 completed shifts: In: 2031.8 [P.O.:120; I.V.:1911.8] Out: 1500 [Urine:1500]   Intake/Output this shift:  Total I/O In: 480 [P.O.:480] Out: -   Physical Exam: General: No acute distress  Head: Normocephalic, atraumatic. Moist oral mucosal membranes  Eyes: Anicteric  Neck: Supple, trachea midline  Lungs:  Clear to auscultation, normal effort  Heart: S1S2 no rubs  Abdomen:  Soft, nontender, bowel sounds present  Extremities: No peripheral edema.  Neurologic: Awake, alert, following commands  Skin piercings noted       Basic Metabolic Panel: Recent Labs  Lab 07/02/17 1656 07/03/17 0342 07/03/17 1011 07/03/17 1450 07/04/17 0528  NA 120* 132* 131* 129* 132*  K 4.3 3.0* 3.1* 3.6 3.5  CL 76* 89* 90* 89* 98*  CO2 24 28 24 23 24   GLUCOSE 680* 138* 246* 393* 316*  BUN 114* 89* 104* 105* 100*  CREATININE 5.45* 4.42* 5.14* 4.74* 3.63*  CALCIUM 10.1 9.9 9.4 9.2 9.4  MG  --   --   --  2.6*  --     Liver Function Tests: Recent Labs  Lab 07/02/17 1656  AST 18  ALT 17  ALKPHOS 157*  BILITOT 1.3*  PROT 10.0*  ALBUMIN 4.9   Recent Labs  Lab 07/02/17 1656  LIPASE 162*   No results for input(s): AMMONIA in the last 168  hours.  CBC: Recent Labs  Lab 07/02/17 1656 07/04/17 0528  WBC 6.0 4.3  HGB 12.9 11.2*  HCT 37.6 31.9*  MCV 90.2 90.8  PLT 232 170    Cardiac Enzymes: Recent Labs  Lab 07/02/17 1656 07/03/17 0342  TROPONINI 0.04* 0.06*    BNP: Invalid input(s): POCBNP  CBG: Recent Labs  Lab 07/03/17 0927 07/03/17 1145 07/03/17 1643 07/03/17 2102 07/04/17 0718  GLUCAP 177* 305* 396* 300* 329*    Microbiology: Results for orders placed or performed during the hospital encounter of 07/02/17  MRSA PCR Screening     Status: None   Collection Time: 07/03/17 12:59 AM  Result Value Ref Range Status   MRSA by PCR NEGATIVE NEGATIVE Final    Comment:        The GeneXpert MRSA Assay (FDA approved for NASAL specimens only), is one component of a comprehensive MRSA colonization surveillance program. It is not intended to diagnose MRSA infection nor to guide or monitor treatment for MRSA infections.     Coagulation Studies: No results for input(s): LABPROT, INR in the last 72 hours.  Urinalysis: Recent Labs    07/02/17 1656  COLORURINE YELLOW*  LABSPEC 1.014  PHURINE 5.0  GLUCOSEU >=500*  HGBUR SMALL*  BILIRUBINUR NEGATIVE  KETONESUR NEGATIVE  PROTEINUR  30*  NITRITE NEGATIVE  LEUKOCYTESUR NEGATIVE      Imaging: Dg Chest 2 View  Result Date: 07/02/2017 CLINICAL DATA:  Chest pain.  Abdominal pain for 2 weeks. EXAM: CHEST  2 VIEW COMPARISON:  12/07/2016 FINDINGS: An ICD is unchanged. The cardiomediastinal silhouette is within normal limits. The lungs are well inflated and clear. No pleural effusion or pneumothorax is identified. No acute osseous abnormality is seen. IMPRESSION: No active cardiopulmonary disease. Electronically Signed   By: Sebastian Ache M.D.   On: 07/02/2017 17:34   US Renal  Result Date: 07/03/2017 CLINICAL DATA:  Acute renal failure. EXAM: RENAL / URINARY TRACT ULTRASOUND COMPLETE COMPARISON:  CT abdomen pelvis dated December 08, 2016. FINDINGS: Right  Kidney: Length: 11.0 cm. Echogenicity within normal limits. No mass or hydronephrosis visualized. Left Kidney: Length: 11.2 cm. Echogenicity within normal limits. No mass or hydronephrosis visualized. Bladder: Appears normal for degree of bladder distention. IMPRESSION: Normal renal ultrasound. Electronically Signed   By: Obie Dredge M.D.   On: 07/03/2017 12:10   US Abdomen Limited Ruq  Result Date: 07/02/2017 CLINICAL DATA:  51 year old female with right upper quadrant abdominal pain and vomiting. EXAM: ULTRASOUND ABDOMEN LIMITED RIGHT UPPER QUADRANT COMPARISON:  Abdominal CT dated 12/08/2016 FINDINGS: Gallbladder: No gallstones or wall thickening visualized. No sonographic Murphy sign noted by sonographer. Common bile duct: Diameter: 5 mm Liver: Grossly unremarkable as visualized. Portal vein is patent on color Doppler imaging with normal direction of blood flow towards the liver. IMPRESSION: Unremarkable right upper quadrant ultrasound. Electronically Signed   By: Elgie Collard M.D.   On: 07/02/2017 20:05     Medications:   . sodium chloride 75 mL/hr at 07/03/17 2304   . atorvastatin  40 mg Oral Daily  . docusate sodium  100 mg Oral BID  . heparin  5,000 Units Subcutaneous Q8H  . insulin aspart  0-15 Units Subcutaneous TID WC  . insulin aspart  0-5 Units Subcutaneous QHS  . insulin glargine  30 Units Subcutaneous Daily  . pantoprazole  40 mg Oral Daily  . PARoxetine  30 mg Oral Daily  . QUEtiapine  300 mg Oral QHS   acetaminophen **OR** [DISCONTINUED] acetaminophen, dicyclomine, LORazepam, nitroGLYCERIN, [DISCONTINUED] ondansetron **OR** ondansetron (ZOFRAN) IV  Assessment/ Plan:  51 y.o. african [place] female with a PMHx of osteoarthritis, bipolar disorder, congestive heart failure, cirrhosis of the liver, depression, diabetes mellitus type 2, hypertension, irritable bowel syndrome, PTSD, restless leg syndrome, obstructive sleep apnea, who was admitted to [facility] on 07/02/2017 for  evaluation of dizziness.   1.  Severe acute renal failure due to severe dehydration from uncontrolled diabetes mellitus type 2, use of BC powders, hypotension. 2.  Chronic kidney disease stage III baseline Cr 1.9 3.  Hypokalemia. 4.  Hypotension.  Plan:  Renal function does appear to be improving however patient still has significant azotemia.  Urine output was 1500 mL over the preceding 24 hours.  Therefore we will continue IV fluid hydration with 0.9 normal saline at 75 cc/h.  She does have history of underlying severe heart failure therefore we are being conservative with the IV fluid hydration rate.  Management of blood sugars as per hospitalist.  Continue to monitor renal parameters closely.  No urgent indication for dialysis at the moment.  Renal ultrasound was reviewed and was negative.    LOS: 1 Rhiana Morash 11/22/201810:29 AM

## 2017-07-04 NOTE — Progress Notes (Signed)
ELECTROLYTE CONSULT  Pharmacy Consult for electrolytes   Labs: Sodium (mmol/L)  Date Value  07/04/2017 132 (L)  02/20/2017 134   Potassium (mmol/L)  Date Value  07/04/2017 3.5   Magnesium (mg/dL)  Date Value  77/10/4033 2.6 (H)   Calcium (mg/dL)  Date Value  24/81/8590 9.4   Albumin (g/dL)  Date Value  93/06/2161 4.9  12/28/2016 4.1    Estimated Creatinine Clearance: 19.5 mL/min (A) (by C-G formula based on SCr of 3.63 mg/dL (H)).  Assessment: 51 yo female admitted with hyperglycemic hyperosmolar nonketotic coma. Pharmacy has been consulted to monitor and replace electrolytes as needed.   Electrolytes are currently within normal limits.   Goal of Therapy:  K = 3.5 - 5  Plan:  Electrolytes are currently within normal limits. No replacement is needed at this time. Pharmacy will continue to follow and adjust as needed.   Demetrius Charity, PharmD  07/04/2017,8:02 AM

## 2017-07-05 LAB — BASIC METABOLIC PANEL
Anion gap: 11 (ref 5–15)
BUN: 55 mg/dL — AB (ref 6–20)
CALCIUM: 9.4 mg/dL (ref 8.9–10.3)
CO2: 23 mmol/L (ref 22–32)
CREATININE: 2.04 mg/dL — AB (ref 0.44–1.00)
Chloride: 100 mmol/L — ABNORMAL LOW (ref 101–111)
GFR calc non Af Amer: 27 mL/min — ABNORMAL LOW (ref >60)
GFR, EST AFRICAN AMERICAN: 31 mL/min — AB (ref >60)
Glucose, Bld: 253 mg/dL — ABNORMAL HIGH (ref 65–99)
Potassium: 3.5 mmol/L (ref 3.5–5.1)
Sodium: 134 mmol/L — ABNORMAL LOW (ref 135–145)

## 2017-07-05 LAB — ANA W/REFLEX IF POSITIVE: Anti Nuclear Antibody(ANA): NEGATIVE

## 2017-07-05 LAB — GLUCOSE, CAPILLARY
Glucose-Capillary: 210 mg/dL — ABNORMAL HIGH (ref 65–99)
Glucose-Capillary: 231 mg/dL — ABNORMAL HIGH (ref 65–99)

## 2017-07-05 MED ORDER — FUROSEMIDE 20 MG PO TABS: 20.0000 mg | ORAL_TABLET | Freq: Every day | ORAL | 0 refills | Status: DC

## 2017-07-05 MED ORDER — INSULIN GLARGINE 100 UNIT/ML ~~LOC~~ SOLN: 40.0000 [IU] | Freq: Every day | SUBCUTANEOUS | 11 refills | Status: DC

## 2017-07-05 NOTE — Discharge Summary (Signed)
[facility] - [place] at [facility]   PATIENT NAME: Bonnie MornCheryl Scroggin    MR#:  [id number]  DATE OF BIRTH:  Dec 06, 1965  DATE OF ADMISSION:  07/02/2017 ADMITTING PHYSICIAN: Arnaldo NatalMichael S Diamond, MD  DATE OF DISCHARGE: 07/05/2017  PRIMARY CARE PHYSICIAN: Dorcas CarrowJohnson, Megan P, DO    ADMISSION DIAGNOSIS:  Hyperglycemia [R73.9] Elevated troponin I level [R74.8] Hyperosmolar non-ketotic state in patient with type 2 diabetes mellitus (HCC) [E11.01] Acute renal failure, unspecified acute renal failure type (HCC) [N17.9]  DISCHARGE DIAGNOSIS:  Active Problems:   HHNC (hyperglycemic hyperosmolar nonketotic coma) (HCC)   Hyperglycemia   Acute renal failure (HCC)   Hyperosmolar non-ketotic state in patient with type 2 diabetes mellitus (HCC)   SECONDARY DIAGNOSIS:   Past Medical History:  Diagnosis Date  . ADHD   . Arthritis   . Asthma   . Bipolar 1 disorder (HCC)   . CHF (congestive heart failure) (HCC)   . Cirrhosis of liver (HCC)   . Coronary artery disease   . Depression   . Diabetes mellitus without complication (HCC)   . Diverticulitis   . Hypertension   . IBS (irritable bowel syndrome)   . Insomnia   . Migraines   . PTSD (post-traumatic stress disorder)   . Restless leg syndrome   . Sleep apnea   . Vertigo     HOSPITAL COURSE:   51 y.o.femalewith a PMHx ofosteoarthritis, bipolar disorder, congestive heart failure, cirrhosis of the liver, depression, diabetes mellitus type 2, hypertension, irritable bowel syndrome, PTSD, restless leg syndrome, obstructive sleep apnea, [facility] on[facility]20/2018for evaluation of dizziness and hyperglycemic hyperosmolar nonketotic coma..  1. Hyperglycemic hyperosmolar nonketotic coma: Transitioned off insulin drip to Lantus 30 units and Novolog moderate tid with meals and HS - DM Nurse following - Increased lantus, as Hyperglycemic.Nurse educated about how to take injections. 2. CHF: Chronic; systolic. As  the patient has renal failure we will have to maintain a slightly positive fluid balance.  Appreciated Help by cardio, held diuretics now.   On d/c resume enteresto and Low dose lasix, but hold spironolactone and coreg.   FOllow in cardio clinic in 1 week.  3. Severe acute on chronic renal failure III (baseline Cr 1.9) due to severe dehydration from uncontrolled diabetes mellitus type 2, use of BC powders, hypotension: Appreciate nephrology input, monitor   IV fluids, Good urine output.   Today creatinin is 2- advised to follow in Nephro clinic.  4. Hyponatremia: Pseudo-;secondary to hyperglycemia- improving. 5. Cirrhosis: continue Lasix (IV) and spironolactone as BP allows 6. Depression: continue Paxil and Seroquel  7. Hyperglycemia: -Improving -Likely from dietary and medication noncompliance  8. Elevated troponin: - due to supply demand ischemia  -Echo reviewed, Cardio seen    DISCHARGE CONDITIONS:   Stable.  CONSULTS OBTAINED:  Treatment Team:  Mady HaagensenLateef, Munsoor, MD Antonieta IbaGollan, Timothy J, MD  DRUG ALLERGIES:   Allergies  Allergen Reactions  . Levothyroxine Rash    DISCHARGE MEDICATIONS:   Current Discharge Medication List    START taking these medications   Details  insulin glargine (LANTUS) 100 UNIT/ML injection Inject 0.4 mLs (40 Units total) into the skin daily. Qty: 10 mL, Refills: 11      CONTINUE these medications which have CHANGED   Details  furosemide (LASIX) 20 MG tablet Take 1 tablet (20 mg total) by mouth daily. Qty: 30 tablet, Refills: 0      CONTINUE these medications which have NOT CHANGED   Details  atorvastatin (LIPITOR) 40  MG tablet Take 1 tablet (40 mg total) by mouth daily. Qty: 90 tablet, Refills: 3    dicyclomine (BENTYL) 20 MG tablet TAKE 1 TABLET (20 MG TOTAL) BY MOUTH 3 (THREE) TIMES DAILY AS NEEDED FOR SPASMS. Qty: 90 tablet, Refills: 3    LORazepam (ATIVAN) 0.5 MG tablet Take 0.5 mg by mouth every 8 (eight) hours as needed  for anxiety.    nitroGLYCERIN (NITROSTAT) 0.4 MG SL tablet Place 1 tablet (0.4 mg total) under the tongue every 5 (five) minutes as needed for chest pain. Qty: 30 tablet, Refills: 12    ondansetron (ZOFRAN ODT) 4 MG disintegrating tablet Take 1 tablet (4 mg total) by mouth every 8 (eight) hours as needed for nausea or vomiting. Qty: 20 tablet, Refills: 0    pantoprazole (PROTONIX) 40 MG tablet TAKE 1 TABLET (40 MG TOTAL) BY MOUTH DAILY. Qty: 30 tablet, Refills: 3    PARoxetine (PAXIL) 30 MG tablet TAKE 1 TABLET BY MOUTH EVERY DAY Qty: 90 tablet, Refills: 0    QUEtiapine (SEROQUEL) 300 MG tablet TAKE 1 TABLET (300 MG TOTAL) BY MOUTH AT BEDTIME. Qty: 90 tablet, Refills: 0    sacubitril-valsartan (ENTRESTO) 49-51 MG Take 1 tablet by mouth 2 (two) times daily. Qty: 60 tablet, Refills: 5      STOP taking these medications     carvedilol (COREG) 6.25 MG tablet      KLOR-CON M20 20 MEQ tablet      potassium chloride (K-DUR,KLOR-CON) 10 MEQ tablet      prazosin (MINIPRESS) 1 MG capsule      spironolactone (ALDACTONE) 25 MG tablet          DISCHARGE INSTRUCTIONS:    Follow with Cardio and Nephro clinic in 1-2 weeks.  If you experience worsening of your admission symptoms, develop shortness of breath, life threatening emergency, suicidal or homicidal thoughts you must seek medical attention immediately by calling 911 or calling your MD immediately  if symptoms less severe.  You Must read complete instructions/literature along with all the possible adverse reactions/side effects for all the Medicines you take and that have been prescribed to you. Take any new Medicines after you have completely understood and accept all the possible adverse reactions/side effects.   Please note  You were cared for by a hospitalist during your hospital stay. If you have any questions about your discharge medications or the care you received while you were in the hospital after you are discharged,  you can call the unit and asked to speak with the hospitalist on call if the hospitalist that took care of you is not available. Once you are discharged, your primary care physician will handle any further medical issues. Please note that NO REFILLS for any discharge medications will be authorized once you are discharged, as it is imperative that you return to your primary care physician (or establish a relationship with a primary care physician if you do not have one) for your aftercare needs so that they can reassess your need for medications and monitor your lab values.    Today   CHIEF COMPLAINT:   Chief Complaint  Patient presents with  . Abdominal Pain  . Dizziness  . Chest Pain    HISTORY OF PRESENT ILLNESS:  Jamesina Ratay  is a 51 y.o. female with a known history of CHF, CAD, diabetes and cirrhosis presented to the emergency department complaining of dizziness.  The patient states that she is also been nauseated and having abdominal pain that  radiates into her chest throughout the day.  She denies shortness of breath, palpitations or diaphoresis.  Laboratory evaluation revealed significantly elevated blood sugar as well as significant decline in her kidney function.  An insulin drip was initiated in the emergency department prior to the hospitalist service being called for further management.   VITAL SIGNS:  Blood pressure 103/69, pulse 89, temperature 98.3 F (36.8 C), temperature source Oral, resp. rate 20, height 5\' 5"  (1.651 m), weight 87.7 kg (193 lb 6.4 oz), SpO2 99 %.  I/O:    Intake/Output Summary (Last 24 hours) at 07/05/2017 1125 Last data filed at 07/05/2017 0900 Gross per 24 hour  Intake 2282.1 ml  Output 2900 ml  Net -617.9 ml    PHYSICAL EXAMINATION:  GENERAL:  51 y.o.-year-old patient lying in the bed with no acute distress.  EYES: Pupils equal, round, reactive to light and accommodation. No scleral icterus. Extraocular muscles intact.  HEENT: Head  atraumatic, normocephalic. Oropharynx and nasopharynx clear.  NECK:  Supple, no jugular venous distention. No thyroid enlargement, no tenderness.  LUNGS: Normal breath sounds bilaterally, no wheezing, rales,rhonchi or crepitation. No use of accessory muscles of respiration.  CARDIOVASCULAR: S1, S2 normal. No murmurs, rubs, or gallops.  ABDOMEN: Soft, non-tender, non-distended. Bowel sounds present. No organomegaly or mass.  EXTREMITIES: No pedal edema, cyanosis, or clubbing.  NEUROLOGIC: Cranial nerves II through XII are intact. Muscle strength 5/5 in all extremities. Sensation intact. Gait not checked.  PSYCHIATRIC: The patient is alert and oriented x 3.  SKIN: No obvious rash, lesion, or ulcer.   DATA REVIEW:   CBC Recent Labs  Lab 07/04/17 0528  WBC 4.3  HGB 11.2*  HCT 31.9*  PLT 170    Chemistries  Recent Labs  Lab 07/02/17 1656  07/03/17 1450  07/05/17 1016  NA 120*   < > 129*   < > 134*  K 4.3   < > 3.6   < > 3.5  CL 76*   < > 89*   < > 100*  CO2 24   < > 23   < > 23  GLUCOSE 680*   < > 393*   < > 253*  BUN 114*   < > 105*   < > 55*  CREATININE 5.45*   < > 4.74*   < > 2.04*  CALCIUM 10.1   < > 9.2   < > 9.4  MG  --   --  2.6*  --   --   AST 18  --   --   --   --   ALT 17  --   --   --   --   ALKPHOS 157*  --   --   --   --   BILITOT 1.3*  --   --   --   --    < > = values in this interval not displayed.    Cardiac Enzymes Recent Labs  Lab 07/03/17 0342  TROPONINI 0.06*    Microbiology Results  Results for orders placed or performed during the hospital encounter of 07/02/17  MRSA PCR Screening     Status: None   Collection Time: 07/03/17 12:59 AM  Result Value Ref Range Status   MRSA by PCR NEGATIVE NEGATIVE Final    Comment:        The GeneXpert MRSA Assay (FDA approved for NASAL specimens only), is one component of a comprehensive MRSA colonization surveillance program. It is not intended to diagnose  MRSA infection nor to guide or monitor  treatment for MRSA infections.     RADIOLOGY:  US Renal  Result Date: 07/03/2017 CLINICAL DATA:  Acute renal failure. EXAM: RENAL / URINARY TRACT ULTRASOUND COMPLETE COMPARISON:  CT abdomen pelvis dated December 08, 2016. FINDINGS: Right Kidney: Length: 11.0 cm. Echogenicity within normal limits. No mass or hydronephrosis visualized. Left Kidney: Length: 11.2 cm. Echogenicity within normal limits. No mass or hydronephrosis visualized. Bladder: Appears normal for degree of bladder distention. IMPRESSION: Normal renal ultrasound. Electronically Signed   By: Obie Dredge M.D.   On: 07/03/2017 12:10    EKG:   Orders placed or performed during the hospital encounter of 07/02/17  . EKG 12-Lead  . EKG 12-Lead  . ED EKG within 10 minutes  . ED EKG within 10 minutes      Management plans discussed with the patient, family and they are in agreement.  CODE STATUS:     Code Status Orders  (From admission, onward)        Start     Ordered   07/03/17 0101  Full code  Continuous     07/03/17 0100    Code Status History    Date Active Date Inactive Code Status Order ID Comments User Context   09/17/2016 17:12 09/18/2016 20:25 Full Code [id number]  Enedina Finner, MD Inpatient   09/10/2016 11:05 09/11/2016 20:45 Full Code [id number]  Auburn Bilberry, MD Inpatient   05/30/2016 11:29 06/04/2016 22:26 Full Code [id number]  Enedina Finner, MD Inpatient      TOTAL TIME TAKING CARE OF THIS PATIENT: 35 minutes.    Altamese Dilling M.D on 07/05/2017 at 11:25 AM  Between 7am to 6pm - Pager - [phone]  After 6pm go to [email or web address] - password [email or web address]  [facility]  Office  [phone]  CC: Primary care physician; Dorcas Carrow, DO   Note: This dictation was prepared with Dragon dictation along with smaller phrase technology. Any transcriptional errors that result from this process are unintentional.

## 2017-07-05 NOTE — Progress Notes (Signed)
[facility]  ROUNDING NOTE   Subjective:  Renal function does appear to be improving. BUN down to 55 with a creatinine of 2.04. She was still a bit dizzy upon standing however.  Objective:  Vital signs in last 24 hours:  Temp:  [98.2 F (36.8 C)-98.3 F (36.8 C)] 98.3 F (36.8 C) (11/23 0444) Pulse Rate:  [77-89] 89 (11/23 0444) Resp:  [19-20] 20 (11/23 0444) BP: (102-107)/(62-71) 103/69 (11/23 0444) SpO2:  [96 %-99 %] 99 % (11/23 0444) Weight:  [87.7 kg (193 lb 6.4 oz)] 87.7 kg (193 lb 6.4 oz) (11/23 0500)  Weight change: 0.907 kg (2 lb) Filed Weights   07/03/17 0111 07/04/17 0531 07/05/17 0500  Weight: 83.4 kg (183 lb 13.8 oz) 86.8 kg (191 lb 6.4 oz) 87.7 kg (193 lb 6.4 oz)    Intake/Output: I/O last 3 completed shifts: In: 3635 [P.O.:720; I.V.:2915] Out: 2900 [Urine:2900]   Intake/Output this shift:  Total I/O In: 342.1 [P.O.:240; I.V.:102.1] Out: 800 [Urine:800]  Physical Exam: General: No acute distress  Head: Normocephalic, atraumatic. Moist oral mucosal membranes  Eyes: Anicteric  Neck: Supple, trachea midline  Lungs:  Clear to auscultation, normal effort  Heart: S1S2 no rubs  Abdomen:  Soft, nontender, bowel sounds present  Extremities: No peripheral edema.  Neurologic: Awake, alert, following commands  Skin piercings noted       Basic Metabolic Panel: Recent Labs  Lab 07/03/17 0342 07/03/17 1011 07/03/17 1450 07/04/17 0528 07/05/17 1016  NA 132* 131* 129* 132* 134*  K 3.0* 3.1* 3.6 3.5 3.5  CL 89* 90* 89* 98* 100*  CO2 28 24 23 24 23   GLUCOSE 138* 246* 393* 316* 253*  BUN 89* 104* 105* 100* 55*  CREATININE 4.42* 5.14* 4.74* 3.63* 2.04*  CALCIUM 9.9 9.4 9.2 9.4 9.4  MG  --   --  2.6*  --   --     Liver Function Tests: Recent Labs  Lab 07/02/17 1656  AST 18  ALT 17  ALKPHOS 157*  BILITOT 1.3*  PROT 10.0*  ALBUMIN 4.9   Recent Labs  Lab 07/02/17 1656  LIPASE 162*   No results for input(s): AMMONIA in the last 168  hours.  CBC: Recent Labs  Lab 07/02/17 1656 07/04/17 0528  WBC 6.0 4.3  HGB 12.9 11.2*  HCT 37.6 31.9*  MCV 90.2 90.8  PLT 232 170    Cardiac Enzymes: Recent Labs  Lab 07/02/17 1656 07/03/17 0342  TROPONINI 0.04* 0.06*    BNP: Invalid input(s): POCBNP  CBG: Recent Labs  Lab 07/04/17 1153 07/04/17 1654 07/04/17 2125 07/05/17 0717 07/05/17 1130  GLUCAP 296* 234* 297* 210* 231*    Microbiology: Results for orders placed or performed during the hospital encounter of 07/02/17  MRSA PCR Screening     Status: None   Collection Time: 07/03/17 12:59 AM  Result Value Ref Range Status   MRSA by PCR NEGATIVE NEGATIVE Final    Comment:        The GeneXpert MRSA Assay (FDA approved for NASAL specimens only), is one component of a comprehensive MRSA colonization surveillance program. It is not intended to diagnose MRSA infection nor to guide or monitor treatment for MRSA infections.     Coagulation Studies: No results for input(s): LABPROT, INR in the last 72 hours.  Urinalysis: Recent Labs    07/02/17 1656  COLORURINE YELLOW*  LABSPEC 1.014  PHURINE 5.0  GLUCOSEU >=500*  HGBUR SMALL*  BILIRUBINUR NEGATIVE  KETONESUR NEGATIVE  PROTEINUR 30*  NITRITE NEGATIVE  LEUKOCYTESUR NEGATIVE      Imaging: US Renal  Result Date: 07/03/2017 CLINICAL DATA:  Acute renal failure. EXAM: RENAL / URINARY TRACT ULTRASOUND COMPLETE COMPARISON:  CT abdomen pelvis dated December 08, 2016. FINDINGS: Right Kidney: Length: 11.0 cm. Echogenicity within normal limits. No mass or hydronephrosis visualized. Left Kidney: Length: 11.2 cm. Echogenicity within normal limits. No mass or hydronephrosis visualized. Bladder: Appears normal for degree of bladder distention. IMPRESSION: Normal renal ultrasound. Electronically Signed   By: Obie Dredge M.D.   On: 07/03/2017 12:10     Medications:   . sodium chloride 50 mL/hr at 07/05/17 1111   . atorvastatin  40 mg Oral Daily  .  docusate sodium  100 mg Oral BID  . heparin  5,000 Units Subcutaneous Q8H  . insulin aspart  0-15 Units Subcutaneous TID WC  . insulin aspart  0-5 Units Subcutaneous QHS  . insulin glargine  40 Units Subcutaneous Daily  . pantoprazole  40 mg Oral Daily  . PARoxetine  30 mg Oral Daily  . QUEtiapine  300 mg Oral QHS   acetaminophen **OR** [DISCONTINUED] acetaminophen, dicyclomine, LORazepam, nitroGLYCERIN, [DISCONTINUED] ondansetron **OR** ondansetron (ZOFRAN) IV  Assessment/ Plan:  51 y.o. african [place] female with a PMHx of osteoarthritis, bipolar disorder, congestive heart failure, cirrhosis of the liver, depression, diabetes mellitus type 2, hypertension, irritable bowel syndrome, PTSD, restless leg syndrome, obstructive sleep apnea, who was admitted to [facility] on 07/02/2017 for evaluation of dizziness.   1.  Severe acute renal failure due to severe dehydration from uncontrolled diabetes mellitus type 2, use of BC powders, hypotension. 2.  Chronic kidney disease stage III baseline Cr 1.9 3.  Hypokalemia. 4.  Hypotension.  Plan:  Overall patient has significantly improved.  BUN down to 55 today with a creatinine of 2.04.  We will reduce IV fluid rate to 50 cc/h.  She was still a bit dizzy upon standing.  We will leave determination of disposition to hospitalist.  She will need cardiology follow-up for her underlying severe heart failure.  We also plan to see her back in the office for follow-up.    LOS: 2 Earlyn Sylvan 11/23/201811:52 AM

## 2017-07-05 NOTE — Progress Notes (Signed)
Provided patient with "Living Better with Heart Failure" packet. Briefly reviewed definition of heart failure and signs and symptoms of an exacerbation. Reviewed importance of and reason behind checking weight daily in the AM, after using the bathroom, but before getting dressed. Discussed when to call the Dr= weight gain of >2lb overnight of 5lb in a week,  Discussed yellow zone= call MD: weight gain of >2lb overnight of 5lb in a week, increased swelling, increased SOB when lying down, chest discomfort, dizziness, increased fatigue Red Zone= call 911: struggle to breath, fainting or near fainting, significant chest pain Reviewed low sodium diet <2g/day-provided handout of recommended and not recommended foods  Fluid restriction <2L/day Reviewed how to read nutrition label Reviewed medication changes: Medications are currently on hold but discussed Entresto, sprionalactone, carvedilol, and furosemide as patient may resume some or all on discharege.  Explained briefly why pt is on the medications (either make you feel better, live longer or keep you out of the hospital) and discussed monitoring and side effects  Discussed tobacco cessation: non-smoker Discussed exercise: as tolerated  Luisa Hart, PharmD Clinical Pharmacist

## 2017-07-05 NOTE — Care Management Important Message (Signed)
Important Message  Patient Details  Name: Bonnie Ochoa MRN: [id number] Date of Birth: [birth year]   Medicare Important Message Given:  Yes    Gwenette Greet, RN 07/05/2017, 12:58 PM

## 2017-07-05 NOTE — Progress Notes (Signed)
Progress Note  Patient Name: Bonnie Ochoa Date of Encounter: 07/05/2017  Primary Cardiologist: Mariah Milling  Subjective   Continues to improve. No chest pain or SOB. Notes some dizziness with standing in her room. BP remains on the softer side in the low 100s systolic. HF medications remain on hold. Renal function continues to improve BUN/SCr 100/3.63-->55/2.04 on IV fluids at 75 mL/hr. Glucose remains elevated in the 200-300 range.   Inpatient Medications    Scheduled Meds: . atorvastatin  40 mg Oral Daily  . docusate sodium  100 mg Oral BID  . heparin  5,000 Units Subcutaneous Q8H  . insulin aspart  0-15 Units Subcutaneous TID WC  . insulin aspart  0-5 Units Subcutaneous QHS  . insulin glargine  40 Units Subcutaneous Daily  . pantoprazole  40 mg Oral Daily  . PARoxetine  30 mg Oral Daily  . QUEtiapine  300 mg Oral QHS   Continuous Infusions: . sodium chloride 75 mL/hr at 07/05/17 0051   PRN Meds: acetaminophen **OR** [DISCONTINUED] acetaminophen, dicyclomine, LORazepam, nitroGLYCERIN, [DISCONTINUED] ondansetron **OR** ondansetron (ZOFRAN) IV   Vital Signs    Vitals:   07/04/17 1300 07/04/17 2011 07/05/17 0444 07/05/17 0500  BP: 102/62 107/71 103/69   Pulse: 77 86 89   Resp: 19 20 20    Temp: 98.2 F (36.8 C) 98.2 F (36.8 C) 98.3 F (36.8 C)   TempSrc: Oral Oral Oral   SpO2: 98% 96% 99%   Weight:    193 lb 6.4 oz (87.7 kg)  Height:        Intake/Output Summary (Last 24 hours) at 07/05/2017 1101 Last data filed at 07/05/2017 0900 Gross per 24 hour  Intake 2282.1 ml  Output 2900 ml  Net -617.9 ml   Filed Weights   07/03/17 0111 07/04/17 0531 07/05/17 0500  Weight: 183 lb 13.8 oz (83.4 kg) 191 lb 6.4 oz (86.8 kg) 193 lb 6.4 oz (87.7 kg)    Telemetry    NSR - Personally Reviewed  ECG    n/a - Personally Reviewed  Physical Exam   GEN: No acute distress.   Neck: No JVD. Cardiac: RRR, no murmurs, rubs, or gallops.  Respiratory: Clear to auscultation  bilaterally.  GI: Soft, nontender, non-distended.   MS: No edema; No deformity. Neuro:  Alert and oriented x 3; Nonfocal.  Psych: Normal affect.  Labs    Chemistry Recent Labs  Lab 07/02/17 1656  07/03/17 1450 07/04/17 0528 07/05/17 1016  NA 120*   < > 129* 132* 134*  K 4.3   < > 3.6 3.5 3.5  CL 76*   < > 89* 98* 100*  CO2 24   < > 23 24 23   GLUCOSE 680*   < > 393* 316* 253*  BUN 114*   < > 105* 100* 55*  CREATININE 5.45*   < > 4.74* 3.63* 2.04*  CALCIUM 10.1   < > 9.2 9.4 9.4  PROT 10.0*  --   --   --   --   ALBUMIN 4.9  --   --   --   --   AST 18  --   --   --   --   ALT 17  --   --   --   --   ALKPHOS 157*  --   --   --   --   BILITOT 1.3*  --   --   --   --   GFRNONAA 8*   < >  10* 13* 27*  GFRAA 9*   < > 11* 15* 31*  ANIONGAP 20*   < > 17* 10 11   < > = values in this interval not displayed.     Hematology Recent Labs  Lab 07/02/17 1656 07/04/17 0528  WBC 6.0 4.3  RBC 4.17 3.52*  HGB 12.9 11.2*  HCT 37.6 31.9*  MCV 90.2 90.8  MCH 30.9 31.9  MCHC 34.2 35.1  RDW 13.0 13.0  PLT 232 170    Cardiac Enzymes Recent Labs  Lab 07/02/17 1656 07/03/17 0342  TROPONINI 0.04* 0.06*   No results for input(s): TROPIPOC in the last 168 hours.   BNPNo results for input(s): BNP, PROBNP in the last 168 hours.   DDimer No results for input(s): DDIMER in the last 168 hours.   Radiology    [place] Renal  Result Date: 07/03/2017 IMPRESSION: Normal renal ultrasound. Electronically Signed   By: Obie DredgeWilliam T Derry M.D.   On: 07/03/2017 12:10    Cardiac Studies   TTE 07/03/2017: Study Conclusions  - Procedure narrative: Transthoracic echocardiography. The study   was technically difficult. - Left ventricle: The cavity size was normal. There was moderate   concentric hypertrophy. Systolic function was severely reduced.   The estimated ejection fraction was in the range of 20% to 25%.   Diffuse hypokinesis. Regional wall motion abnormalities cannot be   excluded. -  Left atrium: The atrium was mildly dilated. - Right ventricle: Systolic function was normal. - Pulmonary arteries: Systolic pressure could not be accurately   estimated.  Impressions:  - Challenging image quality.  Patient Profile     51 y.o. female with history of chronic systolic CHF due to NICM s/p ICD, pulmonary hypertension, moderate mitral regurgitation, polysubstance abuse, noncompliance, bipolar, anxiety, cirrhosis with ascites requiring prior paracentesis, DM, HTN, asthma, OSA, RLS, anemia who is being seen today for the evaluation of CHF in the setting of ARF and hyperglycemia at the request of Dr. Sheryle Hailiamond.  Assessment & Plan    1. ARF: -Improving -Felt to be in the setting of severe dehydration with severe hyperglycemia  -Nephrotoxic agents held  2. NICM: -She does not appear volume overloaded at this time -HF medications including Coreg, Entresto, spironolactone, and Lasix are on hold given ARF -At a later date slowly restart heart failure regimen as renal function continues to improve -CHF education -Decrease IV fluid rate given improving renal function, known cardiomyopathy, and she is also supplementing via PO   3. Poorly controlled DM: -In the setting of noncompliance  -Per IM  4. Cirrhosis: -Lasix and spironolactone on hold given ARF, restart at a later date -Stable -Needs outpatient follow up with PCP  5. Dizziness: -Check orthostatic vital signs   For questions or updates, please contact [facility] Please consult [email or web address] for contact info under Cardiology/STEMI.    Signed, Eula Listenyan Dunn, PA-C [facility] Pager: [phone] 07/05/2017, 11:01 AM   Attending Note Patient seen and examined, agree with detailed note above,  Patient presentation and plan discussed on rounds.   Reports that she feels well Eating drinking, on IV fluids as well Case discussed with nephrology Lab work pending She reports having some dizziness walking  to the bathroom Systolic blood pressure 103 this morning,  Prior to that 107  On physical exam no distress supine in bed no significant JVD lungs are clear to auscultation bilaterally heart sounds regular normal S1-S2 no murmurs appreciated , abdomen soft nontender no significant lower extremity edema  Lab work reviewed showing potassium 3.5, creatinine yesterday 3.63 (At the time of this note Lab work showing creatinine 2.04 with BUN 55) GFR 27  A/P  1) Acute renal failure On arrival with hypotension, dry mouth, tremendous urination secondary to glucose level 600  improvement of renal function with IV fluids, close to baseline Can likely restart cardiac medications over the weekend (tomorrow) with close outpatient follow-up and lab work  2) diabetes, poorly controlled Diet noncompliance, drinking large amounts of Sprite, Fast food Education provided Family typically takes care of her medications, patient relatively noncompliant  3) cirrhosis Prior history of alcohol,  Ascites,stable  4) nonischemic cardiomyopathy  alcohol cessation, avoiding drugs of abuse At time of discharge restart entresto , Aldactone, beta blocker    Total encounter time more than 25 minutes  Greater than 50% was spent in counseling and coordination of care with the patient    Signed: Dossie Arbour  M.D., Ph.D. [facility]

## 2017-07-05 NOTE — Progress Notes (Signed)
[employer] - [place] at [facility]   PATIENT NAME: Bonnie Ochoa    MR#:  [id number]  DATE OF BIRTH:  09-Jun-1966  SUBJECTIVE:  CHIEF COMPLAINT:   Chief Complaint  Patient presents with  . Abdominal Pain  . Dizziness  . Chest Pain   BP stable, moved to floor, renal func slight better, still very high. Blood sugar runs high.  REVIEW OF SYSTEMS:  Review of Systems  Constitutional: Negative for chills, fever and weight loss.  HENT: Negative for nosebleeds and sore throat.   Eyes: Negative for blurred vision.  Respiratory: Negative for cough, shortness of breath and wheezing.   Cardiovascular: Negative for chest pain, orthopnea, leg swelling and PND.  Gastrointestinal: Negative for abdominal pain, constipation, diarrhea, heartburn, nausea and vomiting.  Genitourinary: Negative for dysuria and urgency.  Musculoskeletal: Negative for back pain.  Skin: Negative for rash.  Neurological: Positive for dizziness. Negative for speech change, focal weakness and headaches.  Endo/Heme/Allergies: Does not bruise/bleed easily.  Psychiatric/Behavioral: Negative for depression.    DRUG ALLERGIES:   Allergies  Allergen Reactions  . Levothyroxine Rash   VITALS:  Blood pressure 103/69, pulse 89, temperature 98.3 F (36.8 C), temperature source Oral, resp. rate 20, height 5\' 5"  (1.651 m), weight 87.7 kg (193 lb 6.4 oz), SpO2 99 %. PHYSICAL EXAMINATION:  Physical Exam  Constitutional: She is oriented to person, place, and time and well-developed, well-nourished, and in no distress.  HENT:  Head: Normocephalic and atraumatic.  Eyes: Conjunctivae and EOM are normal. Pupils are equal, round, and reactive to light.  Neck: Normal range of motion. Neck supple. No tracheal deviation present. No thyromegaly present.  Cardiovascular: Normal rate, regular rhythm and normal heart sounds.  Pulmonary/Chest: Effort normal and breath sounds normal. No respiratory distress. She has no  wheezes. She exhibits no tenderness.  Abdominal: Soft. Bowel sounds are normal. She exhibits no distension. There is no tenderness.  Musculoskeletal: Normal range of motion.  Neurological: She is alert and oriented to person, place, and time. No cranial nerve deficit.  Skin: Skin is warm and dry. No rash noted.  Psychiatric: Mood and affect normal.   LABORATORY PANEL:  Female CBC Recent Labs  Lab 07/04/17 0528  WBC 4.3  HGB 11.2*  HCT 31.9*  PLT 170   ------------------------------------------------------------------------------------------------------------------ Chemistries  Recent Labs  Lab 07/02/17 1656  07/03/17 1450  07/05/17 1016  NA 120*   < > 129*   < > 134*  K 4.3   < > 3.6   < > 3.5  CL 76*   < > 89*   < > 100*  CO2 24   < > 23   < > 23  GLUCOSE 680*   < > 393*   < > 253*  BUN 114*   < > 105*   < > 55*  CREATININE 5.45*   < > 4.74*   < > 2.04*  CALCIUM 10.1   < > 9.2   < > 9.4  MG  --   --  2.6*  --   --   AST 18  --   --   --   --   ALT 17  --   --   --   --   ALKPHOS 157*  --   --   --   --   BILITOT 1.3*  --   --   --   --    < > = values in this interval not  displayed.   RADIOLOGY:  No results found. ASSESSMENT AND PLAN:  51 y.o. female with a PMHx of osteoarthritis, bipolar disorder, congestive heart failure, cirrhosis of the liver, depression, diabetes mellitus type 2, hypertension, irritable bowel syndrome, PTSD, restless leg syndrome, obstructive sleep apnea, [facility] for evaluation of dizziness and hyperglycemic hyperosmolar nonketotic coma..   1. Hyperglycemic hyperosmolar nonketotic coma: Transitioning off insulin drip to Lantus 30 units and Novolog moderate tid with meals and HS - DM Nurse following - Increased lantus, as Hyperglycemic. 2.  CHF: Chronic; systolic.  As the patient has renal failure we will have to maintain a slightly positive fluid balance.   Appreciated Help by cardio, held diuretics now.  3. Severe acute  on chronic renal failure III (baseline Cr 1.9) due to severe dehydration from uncontrolled diabetes mellitus type 2, use of BC powders, hypotension: Appreciate nephrology input, monitor   IV fluids, Good urine output. 4.  Hyponatremia: Pseudo-; secondary to hyperglycemia- improving. 5.  Cirrhosis: continue Lasix (IV) and spironolactone as BP allows 6.  Depression: continue Paxil and Seroquel  7. Hyperglycemia: -Improving -Likely from dietary and medication noncompliance  8. Elevated troponin: - due to supply demand ischemia  -Echo reviewed, Cardio seen    All the records are reviewed and case discussed with Care Management/Social Worker. Management plans discussed with the patient, Nursing, PCCM and they are in agreement.  CODE STATUS: Full Code  TOTAL TIME (Critical Care) TAKING CARE OF THIS PATIENT: 35 minutes.   More than 50% of the time was spent in counseling/coordination of care: YES  POSSIBLE D/C IN 1-2 DAYS, DEPENDING ON CLINICAL CONDITION.   Altamese DillingVaibhavkumar Skyelyn Scruggs M.D on 07/05/2017 at 11:17 AM  Between 7am to 6pm - Pager - [phone]  After 6pm go to [email or web address] - Social research officer, governmentpassword EPAS [facility]  [employer]  [facility]ospital[facility]  Office  [phone]  CC: Primary care physician; Dorcas CarrowJohnson, Megan P, DO  Note: This dictation was prepared with Dragon dictation along with smaller phrase technology. Any transcriptional errors that result from this process are unintentional.

## 2017-07-05 NOTE — Progress Notes (Signed)
Pt discharged per MD order. IV removed. Discharge instructions reviewed with pt and questions answered to satisfaction. Prescription given to pt. Pt chose to walk to car.

## 2017-07-08 ENCOUNTER — Telehealth: Payer: Self-pay | Admitting: Cardiovascular Disease

## 2017-07-08 NOTE — Telephone Encounter (Signed)
l mom to call and schedule TCM/PH appt with Dr. Mariah Milling or extender.

## 2017-07-10 LAB — PROTEIN ELECTRO, RANDOM URINE
ALBUMIN ELP UR: 38.3 %
ALPHA-1-GLOBULIN, U: 2.1 %
Alpha-2-Globulin, U: 14.4 %
Beta Globulin, U: 16.9 %
GAMMA GLOBULIN, U: 28.3 %
Total Protein, Urine: 36.8 mg/dL

## 2017-07-12 NOTE — Progress Notes (Signed)
BP (!) 127/91 (BP Location: Left Arm, Patient Position: Sitting, Cuff Size: Normal)   Pulse 84   Temp 98.2 F (36.8 C)   Wt 197 lb 3 oz (89.4 kg)   SpO2 98%   BMI 32.81 kg/m    Subjective:    Patient ID: Bonnie Ochoa, female    DOB: Jan 08, 1966, 51 y.o.   MRN: [id number]  HPI: Bonnie Ochoa is a 51 y.o. female  Chief Complaint  Patient presents with  . Hospitalization Follow-up  . Other    Dry mouth  . Constipation  . Hypothyroidism    states that she's allergic to the levothyroxine   HOSPITAL FOLLOW UP Time since discharge: 10 days Hospital/facility: [facility] Diagnosis:    HHNC (hyperglycemic hyperosmolar nonketotic coma) (HCC)   Hyperglycemia   Acute renal failure (HCC)   Hyperosmolar non-ketotic state in patient with type 2 diabetes mellitus [facility])  Hospital Course:  Per discharge summary: "51 y.o.femalewith a PMHx ofosteoarthritis, bipolar disorder, congestive heart failure, cirrhosis of the liver, depression, diabetes mellitus type 2, hypertension, irritable bowel syndrome, PTSD, restless leg syndrome, obstructive sleep apnea, admitted to [facility] on11/20/2018for evaluation of dizziness and hyperglycemic hyperosmolar nonketotic coma..  1. Hyperglycemic hyperosmolar nonketotic coma: Transitioned off insulin drip to Lantus 30 units and Novolog moderate tid with meals and HS - DM Nurse following - Increased lantus, as Hyperglycemic.Nurse educated about how to take injections. 2. CHF: Chronic; systolic. As the patient has renal failure we will have to maintain a slightly positive fluid balance. Appreciated Help by cardio, held diuretics now.   On d/c resume enteresto and Low dose lasix, but hold spironolactone and coreg.   FOllow in cardio clinic in 1 week.  3. Severe acute on chronic renal failure III (baseline Cr 1.9) due to severe dehydration from uncontrolled diabetes mellitus type 2, use of BC powders, hypotension: Appreciate nephrology input, monitor IV  fluids, Good urine output.   Today creatinin is 2- advised to follow in Nephro clinic.  4. Hyponatremia: Pseudo-;secondary to hyperglycemia- improving. 5. Cirrhosis: continue Lasix (IV) and spironolactone as BP allows 6. Depression: continue Paxil and Seroquel  7. Hyperglycemia: -Improving -Likely from dietary and medication noncompliance  8. Elevated troponin: - due to supply demand ischemia  -Echoreviewed, Cardio seen"  Consultants: Cardiology, nephrology New medications: Lantus 40U, increased dose of lasix STOP taking these medications     carvedilol (COREG) 6.25 MG tablet      KLOR-CON M20 20 MEQ tablet      potassium chloride (K-DUR,KLOR-CON) 10 MEQ tablet      prazosin (MINIPRESS) 1 MG capsule      spironolactone (ALDACTONE) 25 MG tablet        Discharge instructions:  Follow with Cardio and Nephro clinic in 1-2 weeks. Status: better   DIABETES- "someone took me off my metformin". Has been off of it for 4-5 months. Had also been drinking sprite a lot Hypoglycemic episodes:yes Polydipsia/polyuria: yes Visual disturbance: yes Chest pain: no Paresthesias: yes Glucose Monitoring: no Taking Insulin?: yes  Long acting insulin: lantus Blood Pressure Monitoring: not checking Retinal Examination: Not up to Date Foot Exam: Not up to Date Diabetic Education: Not Completed Pneumovax: Up to Date Influenza: Up to Date Aspirin: no  HYPOTHYROIDISM- not taking her medicine. States that she is allergic to the levothyroxine. Has not seen her endocrinologist since August- was supposed to follow up in September. Had a full body rash on the levothyroxine, has not been taking it in months- labs normal in the hospital.  Thyroid control status:stable Satisfied with current treatment? no Medication side effects: yes Medication compliance: poor compliance Recent dose adjustment:no Fatigue: yes Cold intolerance: yes Heat intolerance: no Weight gain:  yes Weight loss: no Constipation: yes Diarrhea/loose stools: no Palpitations: no Lower extremity edema: no Anxiety/depressed mood: yes   Outpatient Encounter Medications as of 07/15/2017  Medication Sig  . atorvastatin (LIPITOR) 40 MG tablet Take 1 tablet (40 mg total) by mouth daily.  Marland Kitchen dicyclomine (BENTYL) 20 MG tablet TAKE 1 TABLET (20 MG TOTAL) BY MOUTH 3 (THREE) TIMES DAILY AS NEEDED FOR SPASMS.  . furosemide (LASIX) 20 MG tablet Take 1 tablet (20 mg total) by mouth daily.  . Insulin Glargine (LANTUS SOLOSTAR) 100 UNIT/ML Solostar Pen Inject 40 Units into the skin daily at 10 pm.  . Insulin Pen Needle 32G X 6 MM MISC 1 each by Does not apply route daily.  Marland Kitchen LORazepam (ATIVAN) 0.5 MG tablet Take 0.5 mg by mouth every 8 (eight) hours as needed for anxiety.  . nitroGLYCERIN (NITROSTAT) 0.4 MG SL tablet Place 1 tablet (0.4 mg total) under the tongue every 5 (five) minutes as needed for chest pain.  Marland Kitchen ondansetron (ZOFRAN ODT) 4 MG disintegrating tablet Take 1 tablet (4 mg total) by mouth every 8 (eight) hours as needed for nausea or vomiting.  . pantoprazole (PROTONIX) 40 MG tablet TAKE 1 TABLET (40 MG TOTAL) BY MOUTH DAILY.  Marland Kitchen PARoxetine (PAXIL) 30 MG tablet TAKE 1 TABLET BY MOUTH EVERY DAY  . QUEtiapine (SEROQUEL) 300 MG tablet TAKE 1 TABLET (300 MG TOTAL) BY MOUTH AT BEDTIME.  . sacubitril-valsartan (ENTRESTO) 49-51 MG Take 1 tablet by mouth 2 (two) times daily.  . [DISCONTINUED] insulin glargine (LANTUS) 100 UNIT/ML injection Inject 0.4 mLs (40 Units total) into the skin daily.   No facility-administered encounter medications on file as of 07/15/2017.      Relevant past medical, surgical, family and social history reviewed and updated as indicated. Interim medical history since our last visit reviewed. Allergies and medications reviewed and updated.  Review of Systems  Constitutional: Positive for fatigue. Negative for activity change, appetite change, chills, diaphoresis, fever  and unexpected weight change.  HENT: Negative.   Eyes: Negative.   Respiratory: Negative.   Cardiovascular: Negative.   Gastrointestinal: Positive for constipation. Negative for abdominal distention, abdominal pain, anal bleeding, blood in stool, diarrhea, nausea, rectal pain and vomiting.  Endocrine: Positive for polydipsia. Negative for cold intolerance, heat intolerance, polyphagia and polyuria.  Neurological: Negative.   Psychiatric/Behavioral: Negative.     Per HPI unless specifically indicated above     Objective:    BP (!) 127/91 (BP Location: Left Arm, Patient Position: Sitting, Cuff Size: Normal)   Pulse 84   Temp 98.2 F (36.8 C)   Wt 197 lb 3 oz (89.4 kg)   SpO2 98%   BMI 32.81 kg/m   Wt Readings from Last 3 Encounters:  07/15/17 197 lb 3 oz (89.4 kg)  07/05/17 193 lb 6.4 oz (87.7 kg)  04/29/17 199 lb 2 oz (90.3 kg)    Physical Exam  Constitutional: She is oriented to person, place, and time. She appears well-developed and well-nourished. No distress.  HENT:  Head: Normocephalic and atraumatic.  Right Ear: Hearing normal.  Left Ear: Hearing normal.  Nose: Nose normal.  Eyes: Conjunctivae and lids are normal. Right eye exhibits no discharge. Left eye exhibits no discharge. No scleral icterus.  Cardiovascular: Normal rate, regular rhythm, normal heart sounds and intact distal pulses. Exam  reveals no gallop and no friction rub.  No murmur heard. Pulmonary/Chest: Effort normal and breath sounds normal. No respiratory distress. She has no wheezes. She has no rales. She exhibits no tenderness.  Musculoskeletal: Normal range of motion. She exhibits no edema.  Appears euvolemic today  Neurological: She is alert and oriented to person, place, and time.  Skin: Skin is warm, dry and intact. No rash noted. She is not diaphoretic. No erythema. No pallor.  Psychiatric: She has a normal mood and affect. Her speech is normal and behavior is normal. Judgment and thought content  normal. Cognition and memory are normal.  Nursing note and vitals reviewed.   Results for orders placed or performed during the hospital encounter of 07/02/17  MRSA PCR Screening  Result Value Ref Range   MRSA by PCR NEGATIVE NEGATIVE  Lipase, blood  Result Value Ref Range   Lipase 162 (H) 11 - 51 U/L  Comprehensive metabolic panel  Result Value Ref Range   Sodium 120 (L) 135 - 145 mmol/L   Potassium 4.3 3.5 - 5.1 mmol/L   Chloride 76 (L) 101 - 111 mmol/L   CO2 24 22 - 32 mmol/L   Glucose, Bld 680 (HH) 65 - 99 mg/dL   BUN 161 (H) 6 - 20 mg/dL   Creatinine, Ser 0.96 (H) 0.44 - 1.00 mg/dL   Calcium 04.5 8.9 - 40.9 mg/dL   Total Protein 81.1 (H) 6.5 - 8.1 g/dL   Albumin 4.9 3.5 - 5.0 g/dL   AST 18 15 - 41 U/L   ALT 17 14 - 54 U/L   Alkaline Phosphatase 157 (H) 38 - 126 U/L   Total Bilirubin 1.3 (H) 0.3 - 1.2 mg/dL   GFR calc non Af Amer 8 (L) >60 mL/min   GFR calc Af Amer 9 (L) >60 mL/min   Anion gap 20 (H) 5 - 15  CBC  Result Value Ref Range   WBC 6.0 3.6 - 11.0 K/uL   RBC 4.17 3.80 - 5.20 MIL/uL   Hemoglobin 12.9 12.0 - 16.0 g/dL   HCT 91.4 78.2 - 95.6 %   MCV 90.2 80.0 - 100.0 fL   MCH 30.9 26.0 - 34.0 pg   MCHC 34.2 32.0 - 36.0 g/dL   RDW 21.3 08.6 - 57.8 %   Platelets 232 150 - 440 K/uL  Urinalysis, Complete w Microscopic  Result Value Ref Range   Color, Urine YELLOW (A) YELLOW   APPearance CLEAR (A) CLEAR   Specific Gravity, Urine 1.014 1.005 - 1.030   pH 5.0 5.0 - 8.0   Glucose, UA >=500 (A) NEGATIVE mg/dL   Hgb urine dipstick SMALL (A) NEGATIVE   Bilirubin Urine NEGATIVE NEGATIVE   Ketones, ur NEGATIVE NEGATIVE mg/dL   Protein, ur 30 (A) NEGATIVE mg/dL   Nitrite NEGATIVE NEGATIVE   Leukocytes, UA NEGATIVE NEGATIVE   RBC / HPF 0-5 0 - 5 RBC/hpf   WBC, UA 0-5 0 - 5 WBC/hpf   Bacteria, UA NONE SEEN NONE SEEN   Squamous Epithelial / LPF 0-5 (A) NONE SEEN   Mucus PRESENT   Troponin I  Result Value Ref Range   Troponin I 0.04 (HH) <0.03 ng/mL    Beta-hydroxybutyric acid  Result Value Ref Range   Beta-Hydroxybutyric Acid 0.19 0.05 - 0.27 mmol/L  Glucose, capillary  Result Value Ref Range   Glucose-Capillary >600 (HH) 65 - 99 mg/dL   Comment 1 REPEATED TO VERIFY, CHARGE CREDITED   Glucose, capillary  Result Value Ref  Range   Glucose-Capillary >600 (HH) 65 - 99 mg/dL   Comment 1 Notify RN    Comment 2 Document in Chart   Glucose, capillary  Result Value Ref Range   Glucose-Capillary 581 (HH) 65 - 99 mg/dL   Comment 1 Document in Chart   Glucose, capillary  Result Value Ref Range   Glucose-Capillary >600 (HH) 65 - 99 mg/dL   Comment 1 Notify RN    Comment 2 Document in Chart    Comment 3 REPEATED TO VERIFY, CHARGE CREDITED   Glucose, capillary  Result Value Ref Range   Glucose-Capillary 530 (HH) 65 - 99 mg/dL   Comment 1 Notify RN    Comment 2 Document in Chart   Glucose, capillary  Result Value Ref Range   Glucose-Capillary 381 (H) 65 - 99 mg/dL  TSH  Result Value Ref Range   TSH 2.888 0.350 - 4.500 uIU/mL  Glucose, capillary  Result Value Ref Range   Glucose-Capillary 268 (H) 65 - 99 mg/dL  Basic metabolic panel  Result Value Ref Range   Sodium 132 (L) 135 - 145 mmol/L   Potassium 3.0 (L) 3.5 - 5.1 mmol/L   Chloride 89 (L) 101 - 111 mmol/L   CO2 28 22 - 32 mmol/L   Glucose, Bld 138 (H) 65 - 99 mg/dL   BUN 89 (H) 6 - 20 mg/dL   Creatinine, Ser 5.784.42 (H) 0.44 - 1.00 mg/dL   Calcium 9.9 8.9 - 46.910.3 mg/dL   GFR calc non Af Amer 10 (L) >60 mL/min   GFR calc Af Amer 12 (L) >60 mL/min   Anion gap 15 5 - 15  Troponin I (q 6hr x 3)  Result Value Ref Range   Troponin I 0.06 (HH) <0.03 ng/mL  TSH  Result Value Ref Range   TSH 2.715 0.350 - 4.500 uIU/mL  Glucose, capillary  Result Value Ref Range   Glucose-Capillary 185 (H) 65 - 99 mg/dL  Glucose, capillary  Result Value Ref Range   Glucose-Capillary 131 (H) 65 - 99 mg/dL  Glucose, capillary  Result Value Ref Range   Glucose-Capillary 119 (H) 65 - 99 mg/dL   Glucose, capillary  Result Value Ref Range   Glucose-Capillary 178 (H) 65 - 99 mg/dL  Glucose, capillary  Result Value Ref Range   Glucose-Capillary 192 (H) 65 - 99 mg/dL  Glucose, capillary  Result Value Ref Range   Glucose-Capillary 187 (H) 65 - 99 mg/dL  Glucose, capillary  Result Value Ref Range   Glucose-Capillary 185 (H) 65 - 99 mg/dL   Comment 1 Notify RN   Glucose, capillary  Result Value Ref Range   Glucose-Capillary 165 (H) 65 - 99 mg/dL  Glucose, capillary  Result Value Ref Range   Glucose-Capillary 177 (H) 65 - 99 mg/dL   Comment 1 Notify RN   Basic metabolic panel  Result Value Ref Range   Sodium 131 (L) 135 - 145 mmol/L   Potassium 3.1 (L) 3.5 - 5.1 mmol/L   Chloride 90 (L) 101 - 111 mmol/L   CO2 24 22 - 32 mmol/L   Glucose, Bld 246 (H) 65 - 99 mg/dL   BUN 629104 (H) 6 - 20 mg/dL   Creatinine, Ser 5.285.14 (H) 0.44 - 1.00 mg/dL   Calcium 9.4 8.9 - 41.310.3 mg/dL   GFR calc non Af Amer 9 (L) >60 mL/min   GFR calc Af Amer 10 (L) >60 mL/min   Anion gap 17 (H) 5 - 15  Hemoglobin A1c  Result Value Ref Range   Hgb A1c MFr Bld >15.5 (H) 4.8 - 5.6 %   Mean Plasma Glucose >398 mg/dL  Basic metabolic panel  Result Value Ref Range   Sodium 129 (L) 135 - 145 mmol/L   Potassium 3.6 3.5 - 5.1 mmol/L   Chloride 89 (L) 101 - 111 mmol/L   CO2 23 22 - 32 mmol/L   Glucose, Bld 393 (H) 65 - 99 mg/dL   BUN 161 (H) 6 - 20 mg/dL   Creatinine, Ser 0.96 (H) 0.44 - 1.00 mg/dL   Calcium 9.2 8.9 - 04.5 mg/dL   GFR calc non Af Amer 10 (L) >60 mL/min   GFR calc Af Amer 11 (L) >60 mL/min   Anion gap 17 (H) 5 - 15  Protein Electro, Random Urine  Result Value Ref Range   Total Protein, Urine 36.8 Not Estab. mg/dL   Albumin ELP, Urine 40.9 %   Alpha-1-Globulin, U 2.1 %   Alpha-2-Globulin, U 14.4 %   Beta Globulin, U 16.9 %   Gamma Globulin, U 28.3 %   M Component, Ur Not Observed Not Observed %   Please Note: Comment   Protein electrophoresis, serum  Result Value Ref Range   Total  Protein ELP 9.2 (H) 6.0 - 8.5 g/dL   Albumin ELP 3.7 2.9 - 4.4 g/dL   WJXBJ-4-NWGNFAOZ 0.3 0.0 - 0.4 g/dL   HYQMV-7-QIONGEXB 1.3 (H) 0.4 - 1.0 g/dL   Beta Globulin 1.5 (H) 0.7 - 1.3 g/dL   Gamma Globulin 2.5 (H) 0.4 - 1.8 g/dL   M-Spike, % Not Observed Not Observed g/dL   SPE Interp. Comment    Comment Comment    GLOBULIN, TOTAL 5.5 (H) 2.2 - 3.9 g/dL   A/G Ratio 0.7 0.7 - 1.7  Protein / creatinine ratio, urine  Result Value Ref Range   Creatinine, Urine 128 mg/dL   Total Protein, Urine 60 mg/dL   Protein Creatinine Ratio 0.47 (H) 0.00 - 0.15 mg/mg[Cre]  ANA w/Reflex if Positive  Result Value Ref Range   Anit Nuclear Antibody(ANA) Negative Negative  Glucose, capillary  Result Value Ref Range   Glucose-Capillary 305 (H) 65 - 99 mg/dL   Comment 1 Notify RN   Magnesium  Result Value Ref Range   Magnesium 2.6 (H) 1.7 - 2.4 mg/dL  Glucose, capillary  Result Value Ref Range   Glucose-Capillary 396 (H) 65 - 99 mg/dL   Comment 1 Notify RN   Basic metabolic panel  Result Value Ref Range   Sodium 132 (L) 135 - 145 mmol/L   Potassium 3.5 3.5 - 5.1 mmol/L   Chloride 98 (L) 101 - 111 mmol/L   CO2 24 22 - 32 mmol/L   Glucose, Bld 316 (H) 65 - 99 mg/dL   BUN 284 (H) 6 - 20 mg/dL   Creatinine, Ser 1.32 (H) 0.44 - 1.00 mg/dL   Calcium 9.4 8.9 - 44.0 mg/dL   GFR calc non Af Amer 13 (L) >60 mL/min   GFR calc Af Amer 15 (L) >60 mL/min   Anion gap 10 5 - 15  CBC  Result Value Ref Range   WBC 4.3 3.6 - 11.0 K/uL   RBC 3.52 (L) 3.80 - 5.20 MIL/uL   Hemoglobin 11.2 (L) 12.0 - 16.0 g/dL   HCT 10.2 (L) 72.5 - 36.6 %   MCV 90.8 80.0 - 100.0 fL   MCH 31.9 26.0 - 34.0 pg   MCHC 35.1 32.0 - 36.0 g/dL   RDW  13.0 11.5 - 14.5 %   Platelets 170 150 - 440 K/uL  Glucose, capillary  Result Value Ref Range   Glucose-Capillary 300 (H) 65 - 99 mg/dL  Glucose, capillary  Result Value Ref Range   Glucose-Capillary 329 (H) 65 - 99 mg/dL  Glucose, capillary  Result Value Ref Range    Glucose-Capillary 296 (H) 65 - 99 mg/dL  Glucose, capillary  Result Value Ref Range   Glucose-Capillary 234 (H) 65 - 99 mg/dL  Glucose, capillary  Result Value Ref Range   Glucose-Capillary 297 (H) 65 - 99 mg/dL  Glucose, capillary  Result Value Ref Range   Glucose-Capillary 210 (H) 65 - 99 mg/dL  Basic metabolic panel  Result Value Ref Range   Sodium 134 (L) 135 - 145 mmol/L   Potassium 3.5 3.5 - 5.1 mmol/L   Chloride 100 (L) 101 - 111 mmol/L   CO2 23 22 - 32 mmol/L   Glucose, Bld 253 (H) 65 - 99 mg/dL   BUN 55 (H) 6 - 20 mg/dL   Creatinine, Ser 1.44 (H) 0.44 - 1.00 mg/dL   Calcium 9.4 8.9 - 31.5 mg/dL   GFR calc non Af Amer 27 (L) >60 mL/min   GFR calc Af Amer 31 (L) >60 mL/min   Anion gap 11 5 - 15  Glucose, capillary  Result Value Ref Range   Glucose-Capillary 231 (H) 65 - 99 mg/dL  ECHOCARDIOGRAM COMPLETE  Result Value Ref Range   Weight 2,941.82 oz   Height 65 in   BP 77/56 mmHg      Assessment & Plan:   Problem List Items Addressed This Visit      Cardiovascular and Mediastinum   Chronic systolic heart failure (HCC) - Primary (Chronic)    Euvolemic today. Has not been doing well on her diet. Sugars very poor right now. Has follow up with cardiology on 12/20. Keep that appointment.       Relevant Orders   CBC with Differential/Platelet   Comprehensive metabolic panel   Ambulatory referral to Home Health   Referral to Nutrition and Diabetes Services     Digestive   Cirrhosis (HCC)    Has not seen GI in months. Will get her back in to see them when she is more stable. Last US done while hospitalized        Endocrine   DM (diabetes mellitus), type 2 with renal complications (HCC)    Under very poor control. Stopped her metformin several months ago for unknown reason. A1c up to >15.5. Started on lantus in the hospital, but not given any needles. Not checking her sugars. Will get her home health. Will get her into [facility]. Will get her checking her  sugars 3x a day- lantus every day at bed time. Recheck 7-10 days with log of sugars.       Relevant Medications   Insulin Glargine (LANTUS SOLOSTAR) 100 UNIT/ML Solostar Pen   Multiple thyroid nodules    Has not followed up with endocrine since August. Will try to get her back in. New referral generated today. Thyroid normal on check in the hospital off levothyroxine. Will recheck today. Await results.       Relevant Orders   Thyroid Panel With TSH   Hypothyroid    Has not followed up with endocrine since August. Will try to get her back in. New referral generated today. Thyroid normal on check in the hospital off levothyroxine. Will recheck today. Await results. ?Allergy to levothyroxine is  to the dyes in the pills- if we restart, will make sure she has white pills only.      Relevant Orders   Thyroid Panel With TSH   HHNC (hyperglycemic hyperosmolar nonketotic coma) (HCC)    Improved. Need to get sugars under control. Continue very close follow up.       Relevant Medications   Insulin Glargine (LANTUS SOLOSTAR) 100 UNIT/ML Solostar Pen   Other Relevant Orders   Comprehensive metabolic panel   Referral to Nutrition and Diabetes Services   Hyperosmolar non-ketotic state in patient with type 2 diabetes mellitus (HCC)    Improved. Need to get sugars under control. Continue very close follow up.       Relevant Medications   Insulin Glargine (LANTUS SOLOSTAR) 100 UNIT/ML Solostar Pen   Other Relevant Orders   Comprehensive metabolic panel   Referral to Nutrition and Diabetes Services     Genitourinary   Benign hypertensive renal disease    Under good control today. Continue current regimen. Continue to monitor. Call with any concerns.       Acute renal failure (HCC)    Rechecking levels today. Await results. Appointment to see nephrology made for Friday. Patient is aware.       Relevant Orders   Comprehensive metabolic panel     Other   Generalized abdominal pain     Likely due to cirrhosis. Will continue to monitor. Call with any concerns. Will see about getting her back in to see her GI doctor.       Relevant Orders   Comprehensive metabolic panel   Bipolar 1 disorder (HCC)    Stable on current regimen. Continue to monitor. Referral to psychiatry made again today.       Relevant Orders   Ambulatory referral to Psychiatry    Other Visit Diagnoses    Screening for breast cancer       Mammorgam ordered today.   Relevant Orders   MM Digital Screening   Screening for colon cancer       Colonoscopy ordered today.    Relevant Orders   Ambulatory referral to Gastroenterology   Encounter for screening for HIV       Labs drawn today. Await results.    Relevant Orders   HIV antibody   Assistance needed with transportation       Does not drive- sister needs to take her to appointments and often cannot. Will refer to [facility]. Referral generated today.   Relevant Orders   Ambulatory referral to [facility]       Follow up plan: Return 7-10 days, for follow up sugars.

## 2017-07-13 ENCOUNTER — Other Ambulatory Visit: Payer: Self-pay | Admitting: Cardiovascular Disease

## 2017-07-15 ENCOUNTER — Ambulatory Visit (INDEPENDENT_AMBULATORY_CARE_PROVIDER_SITE_OTHER): Payer: Medicare Other | Admitting: Family Medicine

## 2017-07-15 ENCOUNTER — Encounter: Payer: Self-pay | Admitting: Family Medicine

## 2017-07-15 VITALS — BP 127/91 | HR 84 | Temp 98.2°F | Wt 197.2 lb

## 2017-07-15 DIAGNOSIS — R1084 Generalized abdominal pain: Secondary | ICD-10-CM

## 2017-07-15 DIAGNOSIS — N183 Chronic kidney disease, stage 3 unspecified: Secondary | ICD-10-CM

## 2017-07-15 DIAGNOSIS — Z794 Long term (current) use of insulin: Secondary | ICD-10-CM

## 2017-07-15 DIAGNOSIS — F319 Bipolar disorder, unspecified: Secondary | ICD-10-CM | POA: Diagnosis not present

## 2017-07-15 DIAGNOSIS — E039 Hypothyroidism, unspecified: Secondary | ICD-10-CM | POA: Diagnosis not present

## 2017-07-15 DIAGNOSIS — E042 Nontoxic multinodular goiter: Secondary | ICD-10-CM

## 2017-07-15 DIAGNOSIS — Z1211 Encounter for screening for malignant neoplasm of colon: Secondary | ICD-10-CM | POA: Diagnosis not present

## 2017-07-15 DIAGNOSIS — E1122 Type 2 diabetes mellitus with diabetic chronic kidney disease: Secondary | ICD-10-CM

## 2017-07-15 DIAGNOSIS — N179 Acute kidney failure, unspecified: Secondary | ICD-10-CM | POA: Diagnosis not present

## 2017-07-15 DIAGNOSIS — E1101 Type 2 diabetes mellitus with hyperosmolarity with coma: Secondary | ICD-10-CM | POA: Diagnosis not present

## 2017-07-15 DIAGNOSIS — I129 Hypertensive chronic kidney disease with stage 1 through stage 4 chronic kidney disease, or unspecified chronic kidney disease: Secondary | ICD-10-CM | POA: Diagnosis not present

## 2017-07-15 DIAGNOSIS — I5022 Chronic systolic (congestive) heart failure: Secondary | ICD-10-CM | POA: Diagnosis not present

## 2017-07-15 DIAGNOSIS — Z1239 Encounter for other screening for malignant neoplasm of breast: Secondary | ICD-10-CM

## 2017-07-15 DIAGNOSIS — Z1231 Encounter for screening mammogram for malignant neoplasm of breast: Secondary | ICD-10-CM | POA: Diagnosis not present

## 2017-07-15 DIAGNOSIS — Z748 Other problems related to care provider dependency: Secondary | ICD-10-CM | POA: Diagnosis not present

## 2017-07-15 DIAGNOSIS — Z114 Encounter for screening for human immunodeficiency virus [HIV]: Secondary | ICD-10-CM

## 2017-07-15 DIAGNOSIS — E1100 Type 2 diabetes mellitus with hyperosmolarity without nonketotic hyperglycemic-hyperosmolar coma (NKHHC): Secondary | ICD-10-CM

## 2017-07-15 DIAGNOSIS — K7031 Alcoholic cirrhosis of liver with ascites: Secondary | ICD-10-CM

## 2017-07-15 MED ORDER — INSULIN GLARGINE 100 UNIT/ML SOLOSTAR PEN: 40.0000 [IU] | PEN_INJECTOR | Freq: Every day | SUBCUTANEOUS | 99 refills | Status: DC

## 2017-07-15 MED ORDER — INSULIN PEN NEEDLE 32G X 6 MM MISC: 1.0000 | Freq: Every day | 12 refills | Status: DC

## 2017-07-15 NOTE — Assessment & Plan Note (Signed)
Likely due to cirrhosis. Will continue to monitor. Call with any concerns. Will see about getting her back in to see her GI doctor.

## 2017-07-15 NOTE — Assessment & Plan Note (Addendum)
Under very poor control. Stopped her metformin several months ago for unknown reason. A1c up to >15.5. Started on lantus in the hospital, but not given any needles. Not checking her sugars. Will get her home health. Will get her into [facility]. Will get her checking her sugars 3x a day- lantus every day at bed time. Recheck 7-10 days with log of sugars.

## 2017-07-15 NOTE — Assessment & Plan Note (Signed)
Has not followed up with endocrine since August. Will try to get her back in. New referral generated today. Thyroid normal on check in the hospital off levothyroxine. Will recheck today. Await results. ?Allergy to levothyroxine is to the dyes in the pills- if we restart, will make sure she has white pills only.

## 2017-07-15 NOTE — Assessment & Plan Note (Signed)
Stable on current regimen. Continue to monitor. Referral to psychiatry made again today.

## 2017-07-15 NOTE — Assessment & Plan Note (Signed)
Under good control today. Continue current regimen. Continue to monitor. Call with any concerns.  

## 2017-07-15 NOTE — Assessment & Plan Note (Signed)
Has not seen GI in months. Will get her back in to see them when she is more stable. Last US done while hospitalized

## 2017-07-15 NOTE — Assessment & Plan Note (Signed)
Improved. Need to get sugars under control. Continue very close follow up.

## 2017-07-15 NOTE — Assessment & Plan Note (Signed)
Improved. Need to get sugars under control. Continue very close follow up.  

## 2017-07-15 NOTE — Assessment & Plan Note (Signed)
Rechecking levels today. Await results. Appointment to see nephrology made for Friday. Patient is aware.

## 2017-07-15 NOTE — Assessment & Plan Note (Signed)
Euvolemic today. Has not been doing well on her diet. Sugars very poor right now. Has follow up with cardiology on 12/20. Keep that appointment.

## 2017-07-15 NOTE — Patient Instructions (Addendum)
Call and schedule your mammogram [facility]  Address: [address] [place], [place], [place] [address]  Phone: [phone]  You have an appointment with: Dr. Ananias Pilgrim [facility] [address]  [address] [place], [place] Friday 07/19/17 10:20AM Please bring your insurance card  All of your medicines in bottles

## 2017-07-15 NOTE — Assessment & Plan Note (Signed)
Has not followed up with endocrine since August. Will try to get her back in. New referral generated today. Thyroid normal on check in the hospital off levothyroxine. Will recheck today. Await results.

## 2017-07-16 LAB — CBC WITH DIFFERENTIAL/PLATELET
BASOS: 0 %
Basophils Absolute: 0 10*3/uL (ref 0.0–0.2)
EOS (ABSOLUTE): 0.2 10*3/uL (ref 0.0–0.4)
EOS: 3 %
HEMATOCRIT: 28.5 % — AB (ref 34.0–46.6)
Hemoglobin: 9.7 g/dL — ABNORMAL LOW (ref 11.1–15.9)
Immature Grans (Abs): 0 10*3/uL (ref 0.0–0.1)
Immature Granulocytes: 1 %
LYMPHS ABS: 2.3 10*3/uL (ref 0.7–3.1)
Lymphs: 29 %
MCH: 31.3 pg (ref 26.6–33.0)
MCHC: 34 g/dL (ref 31.5–35.7)
MCV: 92 fL (ref 79–97)
MONOS ABS: 0.4 10*3/uL (ref 0.1–0.9)
Monocytes: 6 %
NEUTROS ABS: 4.7 10*3/uL (ref 1.4–7.0)
Neutrophils: 61 %
PLATELETS: 234 10*3/uL (ref 150–379)
RBC: 3.1 x10E6/uL — ABNORMAL LOW (ref 3.77–5.28)
RDW: 14.3 % (ref 12.3–15.4)
WBC: 7.7 10*3/uL (ref 3.4–10.8)

## 2017-07-16 LAB — COMPREHENSIVE METABOLIC PANEL
A/G RATIO: 1.1 — AB (ref 1.2–2.2)
ALK PHOS: 134 IU/L — AB (ref 39–117)
ALT: 15 IU/L (ref 0–32)
AST: 14 IU/L (ref 0–40)
Albumin: 3.9 g/dL (ref 3.5–5.5)
BILIRUBIN TOTAL: 0.6 mg/dL (ref 0.0–1.2)
BUN/Creatinine Ratio: 12 (ref 9–23)
BUN: 15 mg/dL (ref 6–24)
CALCIUM: 9.6 mg/dL (ref 8.7–10.2)
CHLORIDE: 101 mmol/L (ref 96–106)
CO2: 23 mmol/L (ref 20–29)
Creatinine, Ser: 1.24 mg/dL — ABNORMAL HIGH (ref 0.57–1.00)
GFR calc Af Amer: 57 mL/min/{1.73_m2} — ABNORMAL LOW (ref >59)
GFR, EST NON AFRICAN AMERICAN: 50 mL/min/{1.73_m2} — AB (ref >59)
GLOBULIN, TOTAL: 3.7 g/dL (ref 1.5–4.5)
Glucose: 210 mg/dL — ABNORMAL HIGH (ref 65–99)
POTASSIUM: 3.8 mmol/L (ref 3.5–5.2)
SODIUM: 141 mmol/L (ref 134–144)
Total Protein: 7.6 g/dL (ref 6.0–8.5)

## 2017-07-16 LAB — THYROID PANEL WITH TSH
Free Thyroxine Index: 2 (ref 1.2–4.9)
T3 Uptake Ratio: 30 % (ref 24–39)
T4 TOTAL: 6.5 ug/dL (ref 4.5–12.0)
TSH: 2.62 u[IU]/mL (ref 0.450–4.500)

## 2017-07-16 LAB — HIV ANTIBODY (ROUTINE TESTING W REFLEX): HIV Screen 4th Generation wRfx: NONREACTIVE

## 2017-07-18 ENCOUNTER — Other Ambulatory Visit: Payer: Self-pay | Admitting: Cardiovascular Disease

## 2017-07-18 NOTE — Telephone Encounter (Signed)
Please review for refill. Thanks!  

## 2017-07-18 NOTE — Telephone Encounter (Signed)
Patient past due for 3 month f/u with Dr Mariah Milling. Has upcoming appointment with Graciela Husbands on 08/01/17. Saw [place] on 04/29/17. Will send in refill with message to call to schedule to see Dr Mariah Milling at some point.

## 2017-07-19 ENCOUNTER — Other Ambulatory Visit: Payer: Self-pay | Admitting: Family Medicine

## 2017-07-19 ENCOUNTER — Telehealth: Payer: Self-pay | Admitting: Family Medicine

## 2017-07-19 DIAGNOSIS — N183 Chronic kidney disease, stage 3 unspecified: Secondary | ICD-10-CM

## 2017-07-19 DIAGNOSIS — R809 Proteinuria, unspecified: Secondary | ICD-10-CM | POA: Diagnosis not present

## 2017-07-19 DIAGNOSIS — N179 Acute kidney failure, unspecified: Secondary | ICD-10-CM | POA: Diagnosis not present

## 2017-07-19 DIAGNOSIS — E1122 Type 2 diabetes mellitus with diabetic chronic kidney disease: Secondary | ICD-10-CM

## 2017-07-19 DIAGNOSIS — Z794 Long term (current) use of insulin: Principal | ICD-10-CM

## 2017-07-19 DIAGNOSIS — D631 Anemia in chronic kidney disease: Secondary | ICD-10-CM | POA: Diagnosis not present

## 2017-07-19 NOTE — Telephone Encounter (Signed)
Called by Dr. Cherylann Ratel. She saw him today. He is concerned about her prognosis. Kidneys are doing better. She would benefit from a multi-disciplinary approach. He thinks that she would benefit from cardiac rehab.

## 2017-07-19 NOTE — Telephone Encounter (Signed)
Copied from CRM [id number]. Topic: Inquiry >> Jul 19, 2017 11:10 AM Raquel Sarna wrote: Tiffany home health nurse [phone] - is helping pt with her Diabetes care.  Pt has no glucometer.  Nurse is asking Dr. Laural Benes to call her in one to the [facility] [place]  Please call Tiffany to let her know [facility]

## 2017-07-23 NOTE — Telephone Encounter (Signed)
Home health nurse asking for a prescription for glucometer, pt currently does not have one. Pt uses [facility] in [place].

## 2017-07-24 ENCOUNTER — Ambulatory Visit: Payer: Medicare Other | Admitting: Family Medicine

## 2017-07-24 MED ORDER — BLOOD GLUCOSE MONITOR KIT: PACK | 0 refills | Status: DC

## 2017-07-24 NOTE — Telephone Encounter (Signed)
Bonnie Ochoa,  I just received message and am routing to you.   Please Advise.

## 2017-07-26 ENCOUNTER — Ambulatory Visit: Payer: Medicare Other | Admitting: *Deleted

## 2017-07-28 ENCOUNTER — Other Ambulatory Visit: Payer: Self-pay | Admitting: Cardiovascular Disease

## 2017-07-29 NOTE — Telephone Encounter (Signed)
Please review for Potassium refill.  Potassium is not on the patient's medication list but per Dr. Windell Hummingbird last office note he said, to take potassium with fluid pill.

## 2017-07-30 ENCOUNTER — Encounter: Payer: Self-pay | Admitting: Family Medicine

## 2017-07-30 ENCOUNTER — Ambulatory Visit (INDEPENDENT_AMBULATORY_CARE_PROVIDER_SITE_OTHER): Payer: Medicare Other | Admitting: Family Medicine

## 2017-07-30 VITALS — BP 140/94 | HR 83 | Temp 98.3°F | Wt 201.0 lb

## 2017-07-30 DIAGNOSIS — K7031 Alcoholic cirrhosis of liver with ascites: Secondary | ICD-10-CM | POA: Diagnosis not present

## 2017-07-30 DIAGNOSIS — I5022 Chronic systolic (congestive) heart failure: Secondary | ICD-10-CM | POA: Diagnosis not present

## 2017-07-30 DIAGNOSIS — N183 Chronic kidney disease, stage 3 unspecified: Secondary | ICD-10-CM

## 2017-07-30 DIAGNOSIS — E1122 Type 2 diabetes mellitus with diabetic chronic kidney disease: Secondary | ICD-10-CM | POA: Diagnosis not present

## 2017-07-30 DIAGNOSIS — I129 Hypertensive chronic kidney disease with stage 1 through stage 4 chronic kidney disease, or unspecified chronic kidney disease: Secondary | ICD-10-CM

## 2017-07-30 DIAGNOSIS — N179 Acute kidney failure, unspecified: Secondary | ICD-10-CM | POA: Diagnosis not present

## 2017-07-30 DIAGNOSIS — S00451A Superficial foreign body of right ear, initial encounter: Secondary | ICD-10-CM | POA: Diagnosis not present

## 2017-07-30 DIAGNOSIS — Z794 Long term (current) use of insulin: Secondary | ICD-10-CM | POA: Diagnosis not present

## 2017-07-30 NOTE — Assessment & Plan Note (Signed)
Euvolemic today. Discussed avoiding processed foods. Will follow up with cardiology on Thursday as needed.

## 2017-07-30 NOTE — Assessment & Plan Note (Signed)
Has not been checking her sugars. Made sure she had a meter today- Rx given and faxed. Start testing 3-4x a day and will follow up in 2 weeks with log to go over her blood sugars. Due to see endocrinology in February.

## 2017-07-30 NOTE — Assessment & Plan Note (Signed)
Will get her back into see GI. Referral generated today.

## 2017-07-30 NOTE — Assessment & Plan Note (Signed)
Improving at her appointment last week with nephrology. Continue to monitor.

## 2017-07-30 NOTE — Progress Notes (Signed)
BP (!) 140/94 (BP Location: Right Arm, Patient Position: Sitting, Cuff Size: Normal)   Pulse 83   Temp 98.3 F (36.8 C)   Wt 201 lb (91.2 kg)   SpO2 99%   BMI 33.45 kg/m    Subjective:    Patient ID: Bonnie Ochoa, female    DOB: [birth year], 51 y.o.   MRN: [id number]  HPI: Bonnie Ochoa is a 51 y.o. female  Chief Complaint  Patient presents with  . Diabetes   Did not get her glucose meter. Has not been checking her sugars. She did see the kidney doctor and is aware that she has to really be careful. Kidney function has been improving. She has not been using salt- has been eating processed foods. "sort of" watching her diet. Has gained 4lbs since her last visit 15 days ago. Has not seen cardiology yet. Due to see them on 12/20. Has an appointment to see psychiatry as well. She is feeling pretty well.   EAG CLOGGED Duration: about a week Involved ear(s):  "right Sensation of feeling clogged/plugged: yes Decreased/muffled hearing:yes Ear pain: no Fever: no Otorrhea: no Hearing loss: no Upper respiratory infection symptoms: no Using Q-Tips: yes Status: stable History of cerumenosis: no Treatments attempted: none   No SOB, no CP, no belly pain. No other concerns or complaints at this time.  Relevant past medical, surgical, family and social history reviewed and updated as indicated. Interim medical history since our last visit reviewed. Allergies and medications reviewed and updated.  Review of Systems  Constitutional: Negative.   Respiratory: Negative.   Cardiovascular: Negative.   Gastrointestinal: Negative.   Neurological: Negative.   Psychiatric/Behavioral: Negative.     Per HPI unless specifically indicated above     Objective:    BP (!) 140/94 (BP Location: Right Arm, Patient Position: Sitting, Cuff Size: Normal)   Pulse 83   Temp 98.3 F (36.8 C)   Wt 201 lb (91.2 kg)   SpO2 99%   BMI 33.45 kg/m   Wt Readings from Last 3 Encounters:  07/30/17 201  lb (91.2 kg)  07/15/17 197 lb 3 oz (89.4 kg)  07/05/17 193 lb 6.4 oz (87.7 kg)    Physical Exam  Constitutional: She is oriented to person, place, and time. She appears well-developed and well-nourished. No distress.  HENT:  Head: Normocephalic and atraumatic.  Right Ear: Hearing normal.  Left Ear: Hearing and external ear normal.  Nose: Nose normal.  Mouth/Throat: Oropharynx is clear and moist. No oropharyngeal exudate.  Cotton swab stuck in R EAC  Eyes: Conjunctivae, EOM and lids are normal. Pupils are equal, round, and reactive to light. Right eye exhibits no discharge. Left eye exhibits no discharge. No scleral icterus.  Neck: Normal range of motion. Neck supple. No JVD present. No tracheal deviation present. No thyromegaly present.  Cardiovascular: Normal rate, regular rhythm, normal heart sounds and intact distal pulses. Exam reveals no gallop and no friction rub.  No murmur heard. Pulmonary/Chest: Effort normal and breath sounds normal. No stridor. No respiratory distress. She has no wheezes. She has no rales. She exhibits no tenderness.  Musculoskeletal: Normal range of motion. She exhibits no edema.  Lymphadenopathy:    She has no cervical adenopathy.  Neurological: She is alert and oriented to person, place, and time.  Skin: Skin is warm, dry and intact. No rash noted. She is not diaphoretic. No erythema. No pallor.  Psychiatric: She has a normal mood and affect. Her speech is normal and behavior  is normal. Judgment and thought content normal. Cognition and memory are normal.  Nursing note and vitals reviewed.   Results for orders placed or performed in visit on 07/15/17  CBC with Differential/Platelet  Result Value Ref Range   WBC 7.7 3.4 - 10.8 x10E3/uL   RBC 3.10 (L) 3.77 - 5.28 x10E6/uL   Hemoglobin 9.7 (L) 11.1 - 15.9 g/dL   Hematocrit 40.928.5 (L) 81.134.0 - 46.6 %   MCV 92 79 - 97 fL   MCH 31.3 26.6 - 33.0 pg   MCHC 34.0 31.5 - 35.7 g/dL   RDW 91.414.3 78.212.3 - 95.615.4 %    Platelets 234 150 - 379 x10E3/uL   Neutrophils 61 Not Estab. %   Lymphs 29 Not Estab. %   Monocytes 6 Not Estab. %   Eos 3 Not Estab. %   Basos 0 Not Estab. %   Neutrophils Absolute 4.7 1.4 - 7.0 x10E3/uL   Lymphocytes Absolute 2.3 0.7 - 3.1 x10E3/uL   Monocytes Absolute 0.4 0.1 - 0.9 x10E3/uL   EOS (ABSOLUTE) 0.2 0.0 - 0.4 x10E3/uL   Basophils Absolute 0.0 0.0 - 0.2 x10E3/uL   Immature Granulocytes 1 Not Estab. %   Immature Grans (Abs) 0.0 0.0 - 0.1 x10E3/uL  Comprehensive metabolic panel  Result Value Ref Range   Glucose 210 (H) 65 - 99 mg/dL   BUN 15 6 - 24 mg/dL   Creatinine, Ser 2.131.24 (H) 0.57 - 1.00 mg/dL   GFR calc non Af Amer 50 (L) >59 mL/min/1.73   GFR calc Af Amer 57 (L) >59 mL/min/1.73   BUN/Creatinine Ratio 12 9 - 23   Sodium 141 134 - 144 mmol/L   Potassium 3.8 3.5 - 5.2 mmol/L   Chloride 101 96 - 106 mmol/L   CO2 23 20 - 29 mmol/L   Calcium 9.6 8.7 - 10.2 mg/dL   Total Protein 7.6 6.0 - 8.5 g/dL   Albumin 3.9 3.5 - 5.5 g/dL   Globulin, Total 3.7 1.5 - 4.5 g/dL   Albumin/Globulin Ratio 1.1 (L) 1.2 - 2.2   Bilirubin Total 0.6 0.0 - 1.2 mg/dL   Alkaline Phosphatase 134 (H) 39 - 117 IU/L   AST 14 0 - 40 IU/L   ALT 15 0 - 32 IU/L  Thyroid Panel With TSH  Result Value Ref Range   TSH 2.620 0.450 - 4.500 uIU/mL   T4, Total 6.5 4.5 - 12.0 ug/dL   T3 Uptake Ratio 30 24 - 39 %   Free Thyroxine Index 2.0 1.2 - 4.9  HIV antibody  Result Value Ref Range   HIV Screen 4th Generation wRfx Non Reactive Non Reactive      Assessment & Plan:   Problem List Items Addressed This Visit      Cardiovascular and Mediastinum   Chronic systolic heart failure (HCC) (Chronic)    Euvolemic today. Discussed avoiding processed foods. Will follow up with cardiology on Thursday as needed.       Relevant Medications   metolazone (ZAROXOLYN) 5 MG tablet   spironolactone (ALDACTONE) 25 MG tablet     Digestive   Cirrhosis (HCC)    Will get her back into see GI. Referral generated  today.      Relevant Orders   Ambulatory referral to Gastroenterology     Endocrine   DM (diabetes mellitus), type 2 with renal complications (HCC) - Primary    Has not been checking her sugars. Made sure she had a meter today- Rx given and faxed. Start  testing 3-4x a day and will follow up in 2 weeks with log to go over her blood sugars. Due to see endocrinology in February.        Genitourinary   Benign hypertensive renal disease   Acute renal failure (HCC)    Improving at her appointment last week with nephrology. Continue to monitor.        Other Visit Diagnoses    Non-penetrating foreign body in ear canal, right, initial encounter       Cotton swab removed. Do not use q-tips.        Follow up plan: Return in about 2 weeks (around 08/13/2017).

## 2017-08-01 ENCOUNTER — Encounter: Payer: Self-pay | Admitting: Internal Medicine

## 2017-08-01 ENCOUNTER — Ambulatory Visit (INDEPENDENT_AMBULATORY_CARE_PROVIDER_SITE_OTHER): Payer: Medicare Other | Admitting: Internal Medicine

## 2017-08-01 VITALS — BP 110/80 | HR 81 | Ht 65.0 in | Wt 201.5 lb

## 2017-08-01 DIAGNOSIS — I428 Other cardiomyopathies: Secondary | ICD-10-CM

## 2017-08-01 DIAGNOSIS — Z95810 Presence of automatic (implantable) cardiac defibrillator: Secondary | ICD-10-CM

## 2017-08-01 DIAGNOSIS — I5022 Chronic systolic (congestive) heart failure: Secondary | ICD-10-CM

## 2017-08-01 NOTE — Patient Instructions (Signed)
Medication Instructions: - Your physician recommends that you continue on your current medications as directed. Please refer to the Current Medication list given to you today.  Labwork: - Your physician recommends that you have lab work today: BMP/CBC   Procedures/Testing: - none ordered  Follow-Up: - Remote monitoring is used to monitor your Pacemaker of ICD from home. This monitoring reduces the number of office visits required to check your device to one time per year. It allows [place] to keep an eye on the functioning of your device to ensure it is working properly. You are scheduled for a device check from home on 09/02/17. You may send your transmission at any time that day. If you have a wireless device, the transmission will be sent automatically. After your physician reviews your transmission, you will receive a postcard with your next transmission date.  - Your physician wants you to follow-up in: 1 year with Dr. Graciela Husbands. You will receive a reminder letter in the mail two months in advance. If you don't receive a letter, please call our office to schedule the follow-up appointment.   Any Additional Special Instructions Will Be Listed Below (If Applicable).     If you need a refill on your cardiac medications before your next appointment, please call your pharmacy.

## 2017-08-01 NOTE — Progress Notes (Signed)
Patient Care Team: Valerie Roys, DO as PCP - General (Family Medicine) [name], MD as Consulting Physician (Gastroenterology) Minna Merritts, MD as Consulting Physician (Cardiology) Alisa Graff, FNP as Nurse Practitioner (Family Medicine)   HPI  Bonnie Ochoa is a 51 y.o. female Seen with CC of [employer] Jude  ICD implanted for primary prevention in the setting of NICM   She has chronic heart failure   She was hospitalized 11/18 with Hyperglycemic hyperosmolar nonketotic coma  As she had run out of metformin  She is much better denying chest pain or dyspnea  No edema   Records and Results Reviewed DATE TEST    10/17 Echo   EF 20-25 %   11/18 Echo    EF 20.25 %         Date Cr K Hgb  11/18 4.74 3.1 11.2  12/18 1.24 3.8 9.7     Past Medical History:  Diagnosis Date  . ADHD   . Arthritis   . Asthma   . Bipolar 1 disorder ([place])   . CHF (congestive heart failure) ([place])   . Cirrhosis of liver ([place])   . Coronary artery disease   . Depression   . Diabetes mellitus without complication ([place])   . Diverticulitis   . Hypertension   . IBS (irritable bowel syndrome)   . Insomnia   . Migraines   . PTSD (post-traumatic stress disorder)   . Restless leg syndrome   . Sleep apnea   . Vertigo     Past Surgical History:  Procedure Laterality Date  . ABDOMINAL HYSTERECTOMY    . debribalator  2016  . INSERT / REPLACE / REMOVE PACEMAKER    . INSERTION OF ICD    . TONSILECTOMY, ADENOIDECTOMY, BILATERAL MYRINGOTOMY AND TUBES    . TONSILLECTOMY    . TONSILLECTOMY    . TUBAL LIGATION  1980  . TUBAL LIGATION      Current Outpatient Medications  Medication Sig Dispense Refill  . atorvastatin (LIPITOR) 40 MG tablet Take 1 tablet (40 mg total) by mouth daily. 90 tablet 3  . blood glucose meter kit and supplies KIT Dispense based on patient and insurance preference. Use up to four times daily as directed. (FOR ICD-9 250.00, 250.01). 1 each 0  . carvedilol  (COREG) 6.25 MG tablet Take 1 tablet (6.25 mg total) by mouth 2 (two) times daily with a meal. Please call to schedule appt with Dr Rockey Situ at your convenience. 180 tablet 0  . dicyclomine (BENTYL) 20 MG tablet TAKE 1 TABLET (20 MG TOTAL) BY MOUTH 3 (THREE) TIMES DAILY AS NEEDED FOR SPASMS. 90 tablet 3  . furosemide (LASIX) 20 MG tablet Take 1 tablet (20 mg total) by mouth daily. 30 tablet 0  . Insulin Glargine (LANTUS SOLOSTAR) 100 UNIT/ML Solostar Pen Inject 40 Units into the skin daily at 10 pm. 5 pen PRN  . Insulin Pen Needle 32G X 6 MM MISC 1 each by Does not apply route daily. 100 each 12  . LORazepam (ATIVAN) 0.5 MG tablet Take 0.5 mg by mouth every 8 (eight) hours as needed for anxiety.    . metolazone (ZAROXOLYN) 5 MG tablet TAKE 1 TABLET (5 MG TOTAL) BY MOUTH DAILY AS NEEDED.  3  . nitroGLYCERIN (NITROSTAT) 0.4 MG SL tablet Place 1 tablet (0.4 mg total) under the tongue every 5 (five) minutes as needed for chest pain. 30 tablet 12  . ondansetron (ZOFRAN ODT) 4 MG  disintegrating tablet Take 1 tablet (4 mg total) by mouth every 8 (eight) hours as needed for nausea or vomiting. 20 tablet 0  . pantoprazole (PROTONIX) 40 MG tablet TAKE 1 TABLET (40 MG TOTAL) BY MOUTH DAILY. 30 tablet 3  . PARoxetine (PAXIL) 30 MG tablet TAKE 1 TABLET BY MOUTH EVERY DAY 90 tablet 0  . QUEtiapine (SEROQUEL) 300 MG tablet TAKE 1 TABLET (300 MG TOTAL) BY MOUTH AT BEDTIME. 90 tablet 0  . sacubitril-valsartan (ENTRESTO) 49-51 MG Take 1 tablet by mouth 2 (two) times daily. 60 tablet 5  . spironolactone (ALDACTONE) 25 MG tablet TAKE 1 TABLET (25 MG TOTAL) BY MOUTH DAILY.  2   No current facility-administered medications for this visit.     Allergies  Allergen Reactions  . Levothyroxine Rash      Review of Systems negative except from HPI and PMH  Physical Exam BP 110/80 (BP Location: Left Arm, Patient Position: Sitting, Cuff Size: Normal)   Pulse 81   Ht 5' 5"  (1.651 m)   Wt 201 lb 8 oz (91.4 kg)   BMI  33.53 kg/m  Well developed and well nourished in no acute distress HENT normal E scleral and icterus clear Neck Supple JVP flat; carotids brisk and full Clear to ausculation Regular rate and rhythm, no murmurs gallops or rub Soft with active bowel sounds No clubbing cyanosis  Edema Alert and oriented, grossly normal motor and sensory function Skin Warm and Dry  ECG Sinus @ 84 15/09/37 LVH w repol Assessment and  Plan NICM  CHF chronic systolic  ICD St Jude  Anemia  Renal failure acute resolved   She is euvolemic in the wake of her catastrophic illness   Continue Guideline directed medical therapy for cardiomyopathy  Hgb was low  12>>9 or so,will recheck today and her renal function and K   ICD interrogated and without interval arrhythmia; function is normal         Current medicines are reviewed at length with the patient today .  The patient does not  have concerns regarding medicines.

## 2017-08-02 LAB — CBC WITH DIFFERENTIAL/PLATELET
BASOS: 1 %
Basophils Absolute: 0 10*3/uL (ref 0.0–0.2)
EOS (ABSOLUTE): 0.1 10*3/uL (ref 0.0–0.4)
EOS: 3 %
HEMATOCRIT: 32.2 % — AB (ref 34.0–46.6)
Hemoglobin: 10.6 g/dL — ABNORMAL LOW (ref 11.1–15.9)
IMMATURE GRANS (ABS): 0 10*3/uL (ref 0.0–0.1)
IMMATURE GRANULOCYTES: 0 %
Lymphocytes Absolute: 1.6 10*3/uL (ref 0.7–3.1)
Lymphs: 36 %
MCH: 31.2 pg (ref 26.6–33.0)
MCHC: 32.9 g/dL (ref 31.5–35.7)
MCV: 95 fL (ref 79–97)
MONOS ABS: 0.3 10*3/uL (ref 0.1–0.9)
Monocytes: 7 %
NEUTROS ABS: 2.4 10*3/uL (ref 1.4–7.0)
NEUTROS PCT: 53 %
Platelets: 228 10*3/uL (ref 150–379)
RBC: 3.4 x10E6/uL — ABNORMAL LOW (ref 3.77–5.28)
RDW: 15.1 % (ref 12.3–15.4)
WBC: 4.4 10*3/uL (ref 3.4–10.8)

## 2017-08-02 LAB — BASIC METABOLIC PANEL
BUN / CREAT RATIO: 17 (ref 9–23)
BUN: 17 mg/dL (ref 6–24)
CALCIUM: 9.5 mg/dL (ref 8.7–10.2)
CO2: 24 mmol/L (ref 20–29)
Chloride: 102 mmol/L (ref 96–106)
Creatinine, Ser: 1.03 mg/dL — ABNORMAL HIGH (ref 0.57–1.00)
GFR, EST AFRICAN AMERICAN: 72 mL/min/{1.73_m2} (ref >59)
GFR, EST NON AFRICAN AMERICAN: 62 mL/min/{1.73_m2} (ref >59)
Glucose: 246 mg/dL — ABNORMAL HIGH (ref 65–99)
Potassium: 4.2 mmol/L (ref 3.5–5.2)
Sodium: 141 mmol/L (ref 134–144)

## 2017-08-07 ENCOUNTER — Ambulatory Visit: Payer: Medicare Other | Admitting: Gastroenterology

## 2017-08-07 ENCOUNTER — Encounter: Payer: Self-pay | Admitting: Gastroenterology

## 2017-08-07 NOTE — Progress Notes (Deleted)
Summary of history :  She is a patient of Dr Servando Snare and was last seen on 01/2017 . She carries a history of cirrhosis of her liver with possible etiology of secondary to right heart failure.   Interval history   6//2018-  08/07/2017   07/02/17- RUQ USG normal  No endoscopy on record.   Labs 07/2017 - plt count 228, cr 1.03,alb 3.9 , no ascites, Tbil 0.6    With liver cirrhosis per imaging . Georgiann Cocker Class A , compensated. USG RUQ 06/2017 shows no HCC. Never had an EGD.   Plan  1. EGD to screen for esophageal varices 2. Check Hep A/B/C vaccine status and will need immunization after , check INR 3. Low salt diet , weight lose, exercise.  4. RUQ USG to be repeated 12/2017

## 2017-08-09 LAB — CUP PACEART INCLINIC DEVICE CHECK
Implantable Lead Implant Date: 20160410
Implantable Pulse Generator Implant Date: 20160410
Lead Channel Pacing Threshold Amplitude: 1.25 V
Lead Channel Sensing Intrinsic Amplitude: 11.8 mV
MDC IDC LEAD LOCATION: 753860
MDC IDC MSMT LEADCHNL RV PACING THRESHOLD PULSEWIDTH: 0.5 ms
MDC IDC PG SERIAL: 7259964
MDC IDC SESS DTM: 20181228154600

## 2017-08-14 ENCOUNTER — Ambulatory Visit: Payer: Medicare Other | Admitting: Gastroenterology

## 2017-08-14 ENCOUNTER — Other Ambulatory Visit: Payer: Self-pay | Admitting: Family Medicine

## 2017-08-14 DIAGNOSIS — K7031 Alcoholic cirrhosis of liver with ascites: Secondary | ICD-10-CM

## 2017-08-14 DIAGNOSIS — R1084 Generalized abdominal pain: Secondary | ICD-10-CM

## 2017-08-14 NOTE — Progress Notes (Signed)
Referral to GI  

## 2017-08-16 ENCOUNTER — Other Ambulatory Visit: Payer: Self-pay

## 2017-08-16 ENCOUNTER — Ambulatory Visit (INDEPENDENT_AMBULATORY_CARE_PROVIDER_SITE_OTHER): Payer: Medicare Other | Admitting: Psychiatry

## 2017-08-16 ENCOUNTER — Encounter: Payer: Self-pay | Admitting: Psychiatry

## 2017-08-16 VITALS — BP 84/63 | HR 103 | Temp 97.8°F | Wt 200.2 lb

## 2017-08-16 DIAGNOSIS — F1021 Alcohol dependence, in remission: Secondary | ICD-10-CM | POA: Diagnosis not present

## 2017-08-16 DIAGNOSIS — F1220 Cannabis dependence, uncomplicated: Secondary | ICD-10-CM | POA: Diagnosis not present

## 2017-08-16 DIAGNOSIS — F410 Panic disorder [episodic paroxysmal anxiety] without agoraphobia: Secondary | ICD-10-CM | POA: Diagnosis not present

## 2017-08-16 DIAGNOSIS — F3130 Bipolar disorder, current episode depressed, mild or moderate severity, unspecified: Secondary | ICD-10-CM | POA: Diagnosis not present

## 2017-08-16 DIAGNOSIS — F4312 Post-traumatic stress disorder, chronic: Secondary | ICD-10-CM

## 2017-08-16 MED ORDER — LAMOTRIGINE 25 MG PO TABS: 25.0000 mg | ORAL_TABLET | Freq: Every day | ORAL | 1 refills | Status: DC
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
## 2017-08-16 NOTE — Patient Instructions (Signed)
Lamotrigine tablets What is this medicine? LAMOTRIGINE (la MOE tri jeen) is used to control seizures in adults and children with epilepsy and Lennox-Gastaut syndrome. It is also used in adults to treat bipolar disorder. This medicine may be used for other purposes; ask your health care provider or pharmacist if you have questions. COMMON BRAND NAME(S): Lamictal What should I tell my health care provider before I take this medicine? They need to know if you have any of these conditions: -a history of depression or bipolar disorder -aseptic meningitis during prior use of lamotrigine -folate deficiency -kidney disease -liver disease -suicidal thoughts, plans, or attempt; a previous suicide attempt by you or a family member -an unusual or allergic reaction to lamotrigine or other seizure medications, other medicines, foods, dyes, or preservatives -pregnant or trying to get pregnant -breast-feeding How should I use this medicine? Take this medicine by mouth with a glass of water. Follow the directions on the prescription label. Do not chew these tablets. If this medicine upsets your stomach, take it with food or milk. Take your doses at regular intervals. Do not take your medicine more often than directed. A special MedGuide will be given to you by the pharmacist with each new prescription and refill. Be sure to read this information carefully each time. Talk to your pediatrician regarding the use of this medicine in children. While this drug may be prescribed for children as young as 2 years for selected conditions, precautions do apply. Overdosage: If you think you have taken too much of this medicine contact a poison control center or emergency room at once. NOTE: This medicine is only for you. Do not share this medicine with others. What if I miss a dose? If you miss a dose, take it as soon as you can. If it is almost time for your next dose, take only that dose. Do not take double or extra  doses. What may interact with this medicine? -carbamazepine -female hormones, including contraceptive or birth control pills -methotrexate -phenobarbital -phenytoin -primidone -pyrimethamine -rifampin -trimethoprim -valproic acid This list may not describe all possible interactions. Give your health care provider a list of all the medicines, herbs, non-prescription drugs, or dietary supplements you use. Also tell them if you smoke, drink alcohol, or use illegal drugs. Some items may interact with your medicine. What should I watch for while using this medicine? Visit your doctor or health care professional for regular checks on your progress. If you take this medicine for seizures, wear a Medic Alert bracelet or necklace. Carry an identification card with information about your condition, medicines, and doctor or health care professional. It is important to take this medicine exactly as directed. When first starting treatment, your dose will need to be adjusted slowly. It may take weeks or months before your dose is stable. You should contact your doctor or health care professional if your seizures get worse or if you have any new types of seizures. Do not stop taking this medicine unless instructed by your doctor or health care professional. Stopping your medicine suddenly can increase your seizures or their severity. Contact your doctor or health care professional right away if you develop a rash while taking this medicine. Rashes may be very severe and sometimes require treatment in the hospital. Deaths from rashes have occurred. Serious rashes occur more often in children than adults taking this medicine. It is more common for these serious rashes to occur during the first 2 months of treatment, but a rash can   occur at any time. You may get drowsy, dizzy, or have blurred vision. Do not drive, use machinery, or do anything that needs mental alertness until you know how this medicine affects you.  To reduce dizzy or fainting spells, do not sit or stand up quickly, especially if you are an older patient. Alcohol can increase drowsiness and dizziness. Avoid alcoholic drinks. If you are taking this medicine for bipolar disorder, it is important to report any changes in your mood to your doctor or health care professional. If your condition gets worse, you get mentally depressed, feel very hyperactive or manic, have difficulty sleeping, or have thoughts of hurting yourself or committing suicide, you need to get help from your health care professional right away. If you are a caregiver for someone taking this medicine for bipolar disorder, you should also report these behavioral changes right away. The use of this medicine may increase the chance of suicidal thoughts or actions. Pay special attention to how you are responding while on this medicine. Your mouth may get dry. Chewing sugarless gum or sucking hard candy, and drinking plenty of water may help. Contact your doctor if the problem does not go away or is severe. Women who become pregnant while using this medicine may enroll in the [place] Antiepileptic Drug Pregnancy Registry by calling 1-888-233-2334. This registry collects information about the safety of antiepileptic drug use during pregnancy. What side effects may I notice from receiving this medicine? Side effects that you should report to your doctor or health care professional as soon as possible: -allergic reactions like skin rash, itching or hives, swelling of the face, lips, or tongue -blurred or double vision -difficulty walking or controlling muscle movements -fever -headache, stiff neck, and sensitivity to light -painful sores in the mouth, eyes, or nose -redness, blistering, peeling or loosening of the skin, including inside the mouth -severe muscle pain -swollen lymph glands -uncontrollable eye movements -unusual bruising or bleeding -unusually weak or  tired -vomiting -worsening of mood, thoughts or actions of suicide or dying -yellowing of the eyes or skin Side effects that usually do not require medical attention (report to your doctor or health care professional if they continue or are bothersome): -diarrhea or constipation -difficulty sleeping -nausea -tremors This list may not describe all possible side effects. Call your doctor for medical advice about side effects. You may report side effects to FDA at 1-800-FDA-1088. Where should I keep my medicine? Keep out of reach of children. Store at room temperature between 15 and 30 degrees C (59 and 86 degrees F). Throw away any unused medicine after the expiration date. NOTE: This sheet is a summary. It may not cover all possible information. If you have questions about this medicine, talk to your doctor, pharmacist, or health care provider.  2018 Elsevier/Gold Standard (2015-09-01 09:29:40)  

## 2017-08-19 ENCOUNTER — Ambulatory Visit (INDEPENDENT_AMBULATORY_CARE_PROVIDER_SITE_OTHER): Payer: Medicare Other | Admitting: Gastroenterology

## 2017-08-19 ENCOUNTER — Other Ambulatory Visit: Payer: Self-pay

## 2017-08-19 ENCOUNTER — Encounter: Payer: Self-pay | Admitting: Gastroenterology

## 2017-08-19 VITALS — BP 114/78 | HR 87 | Ht 65.0 in | Wt 205.6 lb

## 2017-08-19 DIAGNOSIS — R188 Other ascites: Secondary | ICD-10-CM

## 2017-08-19 DIAGNOSIS — K7460 Unspecified cirrhosis of liver: Secondary | ICD-10-CM

## 2017-08-19 NOTE — Progress Notes (Signed)
[name], MD [address]  [place], [place] [address]  Main: [phone]  Fax: [phone]   Primary Care Physician: Valerie Roys, DO  Primary Gastroenterologist:  Dr. [name]  Chief Complaint  Patient presents with  . Abdominal Pain    HPI: Bonnie Ochoa is a 52 y.o. female here for abdominal pain that has resolved.  Patient follows with Dr. Allen Norris for cirrhosis.  She was recently discharged from the hospital on November 23 when she was admitted with hyperglycemia, acute on chronic renal failure.  She states, she does not know why she is here or at this appointment was made.  She states she is eating well without any abdominal pain.  Previously had diffuse, cramping abdominal when she was admitted to the hospital but symptoms resolved after improvement of her hyperglycemia and recent blood sugar control with titration in her insulin.  States she is following with her nephrologist as well and her renal failure is improved.  She is also on diuretics for cirrhosis and cardiomyopathy.  As per Dr. Dorothey Baseman note her cirrhosis has been attributed to her right heart failure versus fatty liver.  She does not drink alcohol.  She denies any lower extremity swelling, abdominal distention, confusion, or episodes of bleeding.  Denies any previous EGDs.  Dr. Allen Norris last saw her in June 2018 and plan was to repeat [facility] screening every 6 months.  Current Outpatient Medications  Medication Sig Dispense Refill  . atorvastatin (LIPITOR) 40 MG tablet Take 1 tablet (40 mg total) by mouth daily. 90 tablet 3  . blood glucose meter kit and supplies KIT Dispense based on patient and insurance preference. Use up to four times daily as directed. (FOR ICD-9 250.00, 250.01). 1 each 0  . carvedilol (COREG) 6.25 MG tablet Take 1 tablet (6.25 mg total) by mouth 2 (two) times daily with a meal. Please call to schedule appt with Dr Rockey Situ at your convenience. 180 tablet 0  . dicyclomine  (BENTYL) 20 MG tablet TAKE 1 TABLET (20 MG TOTAL) BY MOUTH 3 (THREE) TIMES DAILY AS NEEDED FOR SPASMS. 90 tablet 3  . furosemide (LASIX) 20 MG tablet Take 1 tablet (20 mg total) by mouth daily. 30 tablet 0  . Insulin Glargine (LANTUS SOLOSTAR) 100 UNIT/ML Solostar Pen Inject 40 Units into the skin daily at 10 pm. 5 pen PRN  . Insulin Pen Needle 32G X 6 MM MISC 1 each by Does not apply route daily. 100 each 12  . lamoTRIgine (LAMICTAL) 25 MG tablet Take 1 tablet (25 mg total) by mouth daily. 30 tablet 1  . nitroGLYCERIN (NITROSTAT) 0.4 MG SL tablet Place 1 tablet (0.4 mg total) under the tongue every 5 (five) minutes as needed for chest pain. 30 tablet 12  . ondansetron (ZOFRAN ODT) 4 MG disintegrating tablet Take 1 tablet (4 mg total) by mouth every 8 (eight) hours as needed for nausea or vomiting. 20 tablet 0  . pantoprazole (PROTONIX) 40 MG tablet TAKE 1 TABLET (40 MG TOTAL) BY MOUTH DAILY. 30 tablet 3  . PARoxetine (PAXIL) 30 MG tablet TAKE 1 TABLET BY MOUTH EVERY DAY 90 tablet 0  . QUEtiapine (SEROQUEL) 300 MG tablet TAKE 1 TABLET (300 MG TOTAL) BY MOUTH AT BEDTIME. 90 tablet 0  . sacubitril-valsartan (ENTRESTO) 49-51 MG Take 1 tablet by mouth 2 (two) times daily. 60 tablet 5  . spironolactone (ALDACTONE) 25 MG tablet TAKE 1 TABLET (25 MG TOTAL) BY MOUTH DAILY.  2  No current facility-administered medications for this visit.     Allergies as of 08/19/2017 - Review Complete 08/19/2017  Allergen Reaction Noted  . Levothyroxine Rash 07/02/2017    ROS:  General: Negative for anorexia, weight loss, fever, chills, fatigue, weakness. ENT: Negative for hoarseness, difficulty swallowing , nasal congestion. CV: Negative for chest pain, angina, palpitations, dyspnea on exertion, peripheral edema.  Respiratory: Negative for dyspnea at rest, dyspnea on exertion, cough, sputum, wheezing.  GI: See history of present illness. GU:  Negative for dysuria, hematuria, urinary incontinence, urinary  frequency, nocturnal urination.  Endo: Negative for unusual weight change.    Physical Examination:   BP 114/78   Pulse 87   Ht 5' 5"  (1.651 m)   Wt 205 lb 9.6 oz (93.3 kg)   BMI 34.21 kg/m   General: Well-nourished, well-developed in no acute distress.  Eyes: No icterus. Conjunctivae pink. Mouth: Oropharyngeal mucosa moist and pink , no lesions erythema or exudate. Lungs: Clear to auscultation bilaterally. Non-labored. Heart: Regular rate and rhythm, no murmurs rubs or gallops.  Abdomen: Bowel sounds are normal, nontender, nondistended, no hepatosplenomegaly or masses, no abdominal bruits or hernia , no rebound or guarding.   Extremities: No lower extremity edema. No clubbing or deformities. Neuro: Alert and oriented x 3.  Grossly intact. Skin: Warm and dry, no jaundice.   Psych: Alert and cooperative, normal mood and affect.   Labs: CMP     Component Value Date/Time   NA 141 08/01/2017 1050   K 4.2 08/01/2017 1050   CL 102 08/01/2017 1050   CO2 24 08/01/2017 1050   GLUCOSE 246 (H) 08/01/2017 1050   GLUCOSE 253 (H) 07/05/2017 1016   BUN 17 08/01/2017 1050   CREATININE 1.03 (H) 08/01/2017 1050   CALCIUM 9.5 08/01/2017 1050   PROT 7.6 07/15/2017 0939   ALBUMIN 3.9 07/15/2017 0939   AST 14 07/15/2017 0939   ALT 15 07/15/2017 0939   ALKPHOS 134 (H) 07/15/2017 0939   BILITOT 0.6 07/15/2017 0939   GFRNONAA 62 08/01/2017 1050   GFRAA 72 08/01/2017 1050   Lab Results  Component Value Date   WBC 4.4 08/01/2017   HGB 10.6 (L) 08/01/2017   HCT 32.2 (L) 08/01/2017   MCV 95 08/01/2017   PLT 228 08/01/2017    Imaging Studies: No results found.  Assessment and Plan:   Bonnie Ochoa is a 52 y.o. y/o female here post hospital follow-up when she was admitted with hyperglycemia and renal failure which have improved, and had diffuse abdominal pain at that time which has resolved as well.  Abdominal pain - Resolved -Abdominal ultrasound on November 20 was unremarkable with  patent portal vein as well. -Pt. Does not know why she is here or who made this appointment as she is asymptomatic -No indication for further evaluation at this time as symptoms have resolved and were likely related to acute issues at the time of her recent admission -If symptoms reoccur she will call us  Cirrhosis Follow-up with Dr. Allen Norris in February as scheduled No signs of decompensation at this time Appears euvolemic, is on Lasix and spironolactone No signs of encephalopathy No current or previous signs or symptoms of GI bleeding Patient will discuss EGD or colonoscopy for CRC screening with Dr. Allen Norris at the time of the clinic visit Seeing cardiology for cardiomyopathy    Dr [name]

## 2017-08-22 ENCOUNTER — Encounter: Payer: Self-pay | Admitting: Family Medicine

## 2017-08-22 ENCOUNTER — Ambulatory Visit (INDEPENDENT_AMBULATORY_CARE_PROVIDER_SITE_OTHER): Payer: Medicare Other | Admitting: Family Medicine

## 2017-08-22 VITALS — BP 114/81 | HR 103 | Temp 98.3°F | Wt 203.2 lb

## 2017-08-22 DIAGNOSIS — E1122 Type 2 diabetes mellitus with diabetic chronic kidney disease: Secondary | ICD-10-CM

## 2017-08-22 DIAGNOSIS — N179 Acute kidney failure, unspecified: Secondary | ICD-10-CM

## 2017-08-22 DIAGNOSIS — N183 Chronic kidney disease, stage 3 unspecified: Secondary | ICD-10-CM

## 2017-08-22 DIAGNOSIS — K7031 Alcoholic cirrhosis of liver with ascites: Secondary | ICD-10-CM | POA: Diagnosis not present

## 2017-08-22 DIAGNOSIS — Z794 Long term (current) use of insulin: Secondary | ICD-10-CM | POA: Diagnosis not present

## 2017-08-22 DIAGNOSIS — I5022 Chronic systolic (congestive) heart failure: Secondary | ICD-10-CM | POA: Diagnosis not present

## 2017-08-22 DIAGNOSIS — I129 Hypertensive chronic kidney disease with stage 1 through stage 4 chronic kidney disease, or unspecified chronic kidney disease: Secondary | ICD-10-CM | POA: Diagnosis not present

## 2017-08-22 NOTE — Assessment & Plan Note (Signed)
Rechecking CMP today. Has appointment to see GI in about a month. Await that appointment.

## 2017-08-22 NOTE — Assessment & Plan Note (Signed)
Rechecking levels today. Await results.  

## 2017-08-22 NOTE — Assessment & Plan Note (Signed)
Under good control today. Continue current regimen. Continue to monitor. Call with any concerns.  

## 2017-08-22 NOTE — Assessment & Plan Note (Signed)
Has still not gotten her glucose meter. No log brought in today. Call to the pharmacy made- can pick up her glucose meter today. Needs to start testing 3-4x a day so we can have some idea of where her sugars are. Checking CMP today. Await results. To see endocrinology in about a month. Recheck 2 weeks with log.

## 2017-08-22 NOTE — Progress Notes (Addendum)
BP 114/81 (BP Location: Right Arm, Patient Position: Sitting, Cuff Size: Normal)   Pulse (!) 103   Temp 98.3 F (36.8 C)   Wt 203 lb 3 oz (92.2 kg)   SpO2 97%   BMI 33.81 kg/m    Subjective:    Patient ID: Bonnie Ochoa, female    DOB: 10-May-1966, 52 y.o.   MRN: [id number]  HPI: Bonnie Ochoa is a 52 y.o. female  Chief Complaint  Patient presents with  . Diabetes   DIABETES- has been checking her sugars. Did not bring in the log today Hypoglycemic episodes:no Polydipsia/polyuria: yes Visual disturbance: no Chest pain: no Paresthesias: no Glucose Monitoring: yes  Accucheck frequency: 1x a week Taking Insulin?: no Blood Pressure Monitoring: not checking Retinal Examination: Not up to Date Foot Exam: Up to Date Diabetic Education: Not Completed Pneumovax: Up to Date Influenza: Up to Date Aspirin: yes  HYPERTENSION Hypertension status: controlled  Satisfied with current treatment? yes Duration of hypertension: chronic BP monitoring frequency:  not checking BP medication side effects:  no Medication compliance: fair compliance Aspirin: no Recurrent headaches: no Visual changes: no Palpitations: no Dyspnea: no Chest pain: no Lower extremity edema: no Dizzy/lightheaded: no   Relevant past medical, surgical, family and social history reviewed and updated as indicated. Interim medical history since our last visit reviewed. Allergies and medications reviewed and updated.  Review of Systems  Constitutional: Negative.   Respiratory: Negative.   Cardiovascular: Negative.   Gastrointestinal: Negative.   Psychiatric/Behavioral: Negative.     Per HPI unless specifically indicated above     Objective:    BP 114/81 (BP Location: Right Arm, Patient Position: Sitting, Cuff Size: Normal)   Pulse (!) 103   Temp 98.3 F (36.8 C)   Wt 203 lb 3 oz (92.2 kg)   SpO2 97%   BMI 33.81 kg/m   Wt Readings from Last 3 Encounters:  08/22/17 203 lb 3 oz (92.2 kg)    08/19/17 205 lb 9.6 oz (93.3 kg)  08/01/17 201 lb 8 oz (91.4 kg)    Physical Exam  Constitutional: She is oriented to person, place, and time. She appears well-developed and well-nourished. No distress.  HENT:  Head: Normocephalic and atraumatic.  Right Ear: Hearing normal.  Left Ear: Hearing normal.  Nose: Nose normal.  Eyes: Conjunctivae and lids are normal. Right eye exhibits no discharge. Left eye exhibits no discharge. No scleral icterus.  Cardiovascular: Normal rate, regular rhythm, normal heart sounds and intact distal pulses. Exam reveals no gallop and no friction rub.  No murmur heard. Pulmonary/Chest: Effort normal and breath sounds normal. No respiratory distress. She has no wheezes. She has no rales. She exhibits no tenderness.  Musculoskeletal: Normal range of motion.  Neurological: She is alert and oriented to person, place, and time.  Skin: Skin is warm, dry and intact. No rash noted. She is not diaphoretic. No erythema. No pallor.  Psychiatric: She has a normal mood and affect. Her speech is normal and behavior is normal. Judgment and thought content normal. Cognition and memory are normal.  Nursing note and vitals reviewed.   Results for orders placed or performed in visit on 08/01/17  CBC w/Diff  Result Value Ref Range   WBC 4.4 3.4 - 10.8 x10E3/uL   RBC 3.40 (L) 3.77 - 5.28 x10E6/uL   Hemoglobin 10.6 (L) 11.1 - 15.9 g/dL   Hematocrit 94.5 (L) 03.8 - 46.6 %   MCV 95 79 - 97 fL   MCH 31.2 26.6 -  33.0 pg   MCHC 32.9 31.5 - 35.7 g/dL   RDW 16.1 09.6 - 04.5 %   Platelets 228 150 - 379 x10E3/uL   Neutrophils 53 Not Estab. %   Lymphs 36 Not Estab. %   Monocytes 7 Not Estab. %   Eos 3 Not Estab. %   Basos 1 Not Estab. %   Neutrophils Absolute 2.4 1.4 - 7.0 x10E3/uL   Lymphocytes Absolute 1.6 0.7 - 3.1 x10E3/uL   Monocytes Absolute 0.3 0.1 - 0.9 x10E3/uL   EOS (ABSOLUTE) 0.1 0.0 - 0.4 x10E3/uL   Basophils Absolute 0.0 0.0 - 0.2 x10E3/uL   Immature Granulocytes 0  Not Estab. %   Immature Grans (Abs) 0.0 0.0 - 0.1 x10E3/uL  Basic Metabolic Panel (BMET)  Result Value Ref Range   Glucose 246 (H) 65 - 99 mg/dL   BUN 17 6 - 24 mg/dL   Creatinine, Ser 4.09 (H) 0.57 - 1.00 mg/dL   GFR calc non Af Amer 62 >59 mL/min/1.73   GFR calc Af Amer 72 >59 mL/min/1.73   BUN/Creatinine Ratio 17 9 - 23   Sodium 141 134 - 144 mmol/L   Potassium 4.2 3.5 - 5.2 mmol/L   Chloride 102 96 - 106 mmol/L   CO2 24 20 - 29 mmol/L   Calcium 9.5 8.7 - 10.2 mg/dL  CUP PACEART [facility] DEVICE CHECK  Result Value Ref Range   Pulse Generator Manufacturer [employer]    Date Time Interrogation Session [id number]    Pulse Gen Model 1357-40Q Fortify Assura VR    Pulse Gen Serial Number [id number]    Clinic Name [facility]    Implantable Pulse Generator Type Implantable Cardiac Defibulator    Implantable Pulse Generator Implant Date [id number]    Implantable Lead Manufacturer [facility]    Implantable Lead Model [id number] Durata SJ4    Implantable Lead Serial Number [id number]    Implantable Lead Implant Date [id number]    Implantable Lead Location Detail 1 UNKNOWN    Implantable Lead Location [phone]    Lead Channel Sensing Intrinsic Amplitude 11.8 mV   Lead Channel Pacing Threshold Amplitude 1.25 V   Lead Channel Pacing Threshold Pulse Width 0.5 ms   Eval Rhythm VS       Assessment & Plan:   Problem List Items Addressed This Visit      Cardiovascular and Mediastinum   Chronic systolic heart failure (HCC) (Chronic)    Euvolemic today. Continue to follow with cardiology. Call with any concerns.         Digestive   Cirrhosis (HCC)    Rechecking CMP today. Has appointment to see GI in about a month. Await that appointment.       Relevant Orders   Comprehensive metabolic panel     Endocrine   DM (diabetes mellitus), type 2 with renal complications (HCC) - Primary    Has still not gotten her glucose meter. No log brought in today. Call to the pharmacy made- can pick up her glucose meter  today. Needs to start testing 3-4x a day so we can have some idea of where her sugars are. Checking CMP today. Await results. To see endocrinology in about a month. Recheck 2 weeks with log.      Relevant Orders   Comprehensive metabolic panel     Genitourinary   Benign hypertensive renal disease    Under good control today. Continue current regimen. Continue to monitor. Call with any concerns.       Relevant Orders  Comprehensive metabolic panel   Acute renal failure (HCC)    Rechecking levels today. Await results.      Relevant Orders   Comprehensive metabolic panel       Follow up plan: Return 2-3 weeks, for follow up sugars WITH LOG.

## 2017-08-22 NOTE — Patient Instructions (Signed)
Your glucose meter is waiting for you at [facility].  Your meter has a $6.27 copay Your strips has a $3.33 copay

## 2017-08-22 NOTE — Assessment & Plan Note (Signed)
Euvolemic today. Continue to follow with cardiology. Call with any concerns.  

## 2017-08-23 LAB — COMPREHENSIVE METABOLIC PANEL
A/G RATIO: 1.3 (ref 1.2–2.2)
ALT: 14 IU/L (ref 0–32)
AST: 15 IU/L (ref 0–40)
Albumin: 4.6 g/dL (ref 3.5–5.5)
Alkaline Phosphatase: 142 IU/L — ABNORMAL HIGH (ref 39–117)
BILIRUBIN TOTAL: 0.6 mg/dL (ref 0.0–1.2)
BUN/Creatinine Ratio: 14 (ref 9–23)
BUN: 16 mg/dL (ref 6–24)
CALCIUM: 10 mg/dL (ref 8.7–10.2)
CHLORIDE: 102 mmol/L (ref 96–106)
CO2: 24 mmol/L (ref 20–29)
Creatinine, Ser: 1.11 mg/dL — ABNORMAL HIGH (ref 0.57–1.00)
GFR, EST AFRICAN AMERICAN: 66 mL/min/{1.73_m2} (ref >59)
GFR, EST NON AFRICAN AMERICAN: 57 mL/min/{1.73_m2} — AB (ref >59)
GLOBULIN, TOTAL: 3.6 g/dL (ref 1.5–4.5)
Glucose: 187 mg/dL — ABNORMAL HIGH (ref 65–99)
POTASSIUM: 4.5 mmol/L (ref 3.5–5.2)
SODIUM: 142 mmol/L (ref 134–144)
TOTAL PROTEIN: 8.2 g/dL (ref 6.0–8.5)

## 2017-08-28 ENCOUNTER — Other Ambulatory Visit: Payer: Self-pay

## 2017-08-28 ENCOUNTER — Ambulatory Visit (INDEPENDENT_AMBULATORY_CARE_PROVIDER_SITE_OTHER): Payer: Medicare Other | Admitting: Licensed Clinical Social Worker

## 2017-08-28 DIAGNOSIS — F4312 Post-traumatic stress disorder, chronic: Secondary | ICD-10-CM

## 2017-08-28 DIAGNOSIS — F1220 Cannabis dependence, uncomplicated: Secondary | ICD-10-CM

## 2017-08-28 DIAGNOSIS — F3130 Bipolar disorder, current episode depressed, mild or moderate severity, unspecified: Secondary | ICD-10-CM | POA: Diagnosis not present

## 2017-08-28 MED ORDER — PANTOPRAZOLE SODIUM 40 MG PO TBEC: 40.0000 mg | DELAYED_RELEASE_TABLET | Freq: Every day | ORAL | 11 refills | Status: DC
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
## 2017-09-02 ENCOUNTER — Ambulatory Visit (INDEPENDENT_AMBULATORY_CARE_PROVIDER_SITE_OTHER): Payer: Medicare Other

## 2017-09-02 ENCOUNTER — Ambulatory Visit (INDEPENDENT_AMBULATORY_CARE_PROVIDER_SITE_OTHER): Payer: Medicare Other | Admitting: *Deleted

## 2017-09-02 DIAGNOSIS — I5022 Chronic systolic (congestive) heart failure: Secondary | ICD-10-CM

## 2017-09-02 DIAGNOSIS — Z95810 Presence of automatic (implantable) cardiac defibrillator: Secondary | ICD-10-CM | POA: Diagnosis not present

## 2017-09-02 DIAGNOSIS — I428 Other cardiomyopathies: Secondary | ICD-10-CM | POA: Diagnosis not present

## 2017-09-02 NOTE — Progress Notes (Signed)
EPIC Encounter for ICM Monitoring  Patient Name: Bonnie Ochoa is a 52 y.o. female Date: 09/02/2017 Primary Care Physican: Valerie Roys, DO Primary Cardiologist: Sanjuana Letters, NP Electrophysiologist: Faustino Congress Weight:197 lbs              Heart Failure questions reviewed, pt asymptomatic.   Thoracic impedance normal.  Prescribed dosage: Furosemide 40 mg 1.5 tablets (60 mg total) by mouth 2 (two) times daily. Potassium 20 mEq take 1 tablet daily.   Labs: 02/20/2017 Creatinine 1.91, BUN 61, Potassium 4.5, Sodium 134, EGFR 29-34 02/01/2017 Creatinine 1.47, BUN 25, Potassium 4.6, Sodium 138, EGFR 40-47 12/28/2016 Creatinine 1.28, BUN 34, Potassium 5.2, Sodium 139, EGFR 48-55 12/08/2016 Creatinine 1.15, BUN 30, Potassium 3.9, Sodium 138, EGFR 54->60 12/07/2016 Creatinine 1.14, BUN 32, Potassium 4.2, Sodium 135, EGFR 52->60 12/04/2016 Creatinine 1.18, BUN 30, Potassium 3.9, Sodium 137, EGFR 54->60 11/05/2016 Creatinine 0.81, BUN 22, Potassium 3.9, Sodium 136, EGFR >60 09/18/2016 Creatinine 1.34, BUN 28, Potassium 3.3, Sodium 138, EGFR 45-52  09/17/2016 Creatinine 1.20, BUN 24, Potassium 3.4, Sodium 138, EGFR 51-59  09/11/2016 Creatinine 1.14, BUN 22, Potassium 3.3, Sodium 138, EGFR 54->60  01/28/2018Creatinine 1.13, BUN 24, Potassium 3.6, Sodium 136, EGFR 55->60  Recommendations: No changes.   Encouraged to call for fluid symptoms.  Follow-up plan: ICM clinic phone appointment on 10/03/2017.  Office appointment scheduled 09/04/2017 with [name] NP.  Copy of ICM check sent to Dr. Caryl Comes.   3 month ICM trend: 09/02/2017    1 Year ICM trend:       Rosalene Billings, RN 09/02/2017 10:40 AM

## 2017-09-02 NOTE — Progress Notes (Signed)
Remote ICD transmission.   

## 2017-09-03 ENCOUNTER — Encounter: Payer: Self-pay | Admitting: Cardiology

## 2017-09-03 NOTE — Progress Notes (Deleted)
Patient ID: Bonnie Ochoa, female    DOB: 10/17/1965, 52 y.o.   MRN: [id number]  HPI  Bonnie Ochoa is a 52 y/o female with a history of obstructive sleep apnea, restless leg syndrome, PTSD, HTN, DM, depression, CAD, cirrhosis, bipolar, asthma, marijuana use and chronic heart failure.   Echo report from 07/03/17 reviewed and showed an EF of 20-25%. Echo was done 05/28/16 and showed an EF of 20-25% along with moderate MR/TR and severely elevated PA pressure of 64 mm Hg.    Admitted 07/10/17 due to hyperglycemia along with acute on chronic renal failure. Cardiology and nephrology consults were obtained. Initially needed an insulin drip and then transitioned to lantus/novolog. Discharged after 3 days.   She presents today for her follow-up visit with a chief complaint of   Past Medical History:  Diagnosis Date  . ADHD   . Arthritis   . Asthma   . Bipolar 1 disorder (HCC)   . CHF (congestive heart failure) (HCC)   . Cirrhosis of liver (HCC)   . Coronary artery disease   . Depression   . Diabetes mellitus without complication (HCC)   . Diverticulitis   . Hypertension   . IBS (irritable bowel syndrome)   . Insomnia   . Migraines   . PTSD (post-traumatic stress disorder)   . Restless leg syndrome   . Sleep apnea   . Vertigo    Past Surgical History:  Procedure Laterality Date  . ABDOMINAL HYSTERECTOMY    . debribalator  2016  . INSERT / REPLACE / REMOVE PACEMAKER    . INSERTION OF ICD    . TONSILECTOMY, ADENOIDECTOMY, BILATERAL MYRINGOTOMY AND TUBES    . TONSILLECTOMY    . TONSILLECTOMY    . TUBAL LIGATION  1980  . TUBAL LIGATION     Family History  Problem Relation Age of Onset  . Hypertension Mother   . Hyperlipidemia Mother   . Heart disease Mother   . Hypertension Sister   . Asthma Sister   . Heart disease Sister   . Diabetes Sister   . Cancer Sister   . Alzheimer's disease Maternal Grandfather   . Hyperlipidemia Brother   . Asthma Sister   . Hypertension Sister   .  Diabetes Sister    Social History   Tobacco Use  . Smoking status: Former Games developer  . Smokeless tobacco: Never Used  Substance Use Topics  . Alcohol use: No   Allergies  Allergen Reactions  . Levothyroxine Rash     Review of Systems  Constitutional: Positive for fatigue. Negative for appetite change.  HENT: Negative for congestion, nosebleeds and sore throat.   Eyes: Negative.   Respiratory: Positive for chest tightness. Negative for shortness of breath.   Cardiovascular: Positive for chest pain. Negative for palpitations and leg swelling.  Gastrointestinal: Negative for abdominal distention and abdominal pain.  Endocrine: Negative.   Genitourinary: Negative.   Musculoskeletal: Negative for arthralgias and neck pain.  Skin: Negative.   Allergic/Immunologic: Negative.   Neurological: Positive for light-headedness. Negative for dizziness.  Hematological: Negative for adenopathy. Does not bruise/bleed easily.  Psychiatric/Behavioral: Positive for dysphoric mood. Negative for sleep disturbance (sleeping on 2 pillows) and suicidal ideas. The patient is nervous/anxious.      Physical Exam  Constitutional: She is oriented to person, place, and time. She appears well-developed and well-nourished.  HENT:  Head: Normocephalic and atraumatic.  Neck: Normal range of motion. Neck supple. No JVD present.  Cardiovascular: Normal rate and regular rhythm.  Pulmonary/Chest: Effort normal. She has no wheezes. She has no rales.  Abdominal: Soft. She exhibits no distension. There is no tenderness.  Musculoskeletal: She exhibits no edema or tenderness.  Neurological: She is alert and oriented to person, place, and time.  Skin: Skin is warm and dry.  Psychiatric: She has a normal mood and affect. Her behavior is normal. Thought content normal.  Nursing note and vitals reviewed.   Assessment & Plan:  1: Chronic heart failure with reduced ejection fraction- - NYHA class II - euvolemic -  Trying to weigh daily-sometimes forgets; wife says she writes down her daily weights. She's to call for an overnight weight gain of >2 pounds or a weekly weight gain of >5 pounds.  - not adding salt to her food and is trying to eat low sodium foods. Continues to read food labels so that she can keep her sodium intake to 2000mg  daily. - Has been trying to drink around 5-6 cups (8oz) of water a day. Discussed the importance of maintaining her fluid intake between 40-48 ounces of fluid daily.  - saw cardiologist Mariah Milling) 11/19/16 - saw EP Graciela Husbands) 08/01/17 - BNP 09/17/16 was >4500.0  2: HTN- - BP looks good today - saw PCP Laural Benes) 08/22/17 - BMP from 08/22/17 reviewed and showed sodium 142, potassium 4.5 and GFR 66  3: Depression- - does have a history of PTSD - Continue current medications  - saw behavioral medicine counselor 08/28/17  4: Diabetes- - saw nephrologist 04/10/17 - A1c on 07/03/17 was >15.5%  Patient did not bring her medications nor a list. Each medication was verbally reviewed with the patient and she was encouraged to bring the bottles to every visit to confirm accuracy of list.

## 2017-09-04 ENCOUNTER — Ambulatory Visit: Payer: Medicare Other | Admitting: Family

## 2017-09-06 ENCOUNTER — Ambulatory Visit: Payer: Medicare Other | Admitting: Family Medicine

## 2017-09-09 ENCOUNTER — Ambulatory Visit (INDEPENDENT_AMBULATORY_CARE_PROVIDER_SITE_OTHER): Payer: Medicare Other | Admitting: Licensed Clinical Social Worker

## 2017-09-09 DIAGNOSIS — F1220 Cannabis dependence, uncomplicated: Secondary | ICD-10-CM | POA: Diagnosis not present

## 2017-09-09 DIAGNOSIS — F4312 Post-traumatic stress disorder, chronic: Secondary | ICD-10-CM

## 2017-09-09 DIAGNOSIS — F3130 Bipolar disorder, current episode depressed, mild or moderate severity, unspecified: Secondary | ICD-10-CM

## 2017-09-10 ENCOUNTER — Ambulatory Visit: Payer: Medicare Other | Attending: Family | Admitting: Family

## 2017-09-10 ENCOUNTER — Encounter: Payer: Self-pay | Admitting: Family

## 2017-09-10 ENCOUNTER — Encounter: Payer: Self-pay | Admitting: Family Medicine

## 2017-09-10 VITALS — BP 119/82 | HR 86 | Resp 18 | Ht 65.0 in | Wt 209.5 lb

## 2017-09-10 DIAGNOSIS — F319 Bipolar disorder, unspecified: Secondary | ICD-10-CM | POA: Insufficient documentation

## 2017-09-10 DIAGNOSIS — Z79899 Other long term (current) drug therapy: Secondary | ICD-10-CM | POA: Insufficient documentation

## 2017-09-10 DIAGNOSIS — K7460 Unspecified cirrhosis of liver: Secondary | ICD-10-CM | POA: Diagnosis not present

## 2017-09-10 DIAGNOSIS — G2581 Restless legs syndrome: Secondary | ICD-10-CM | POA: Diagnosis not present

## 2017-09-10 DIAGNOSIS — E1122 Type 2 diabetes mellitus with diabetic chronic kidney disease: Secondary | ICD-10-CM | POA: Insufficient documentation

## 2017-09-10 DIAGNOSIS — I130 Hypertensive heart and chronic kidney disease with heart failure and stage 1 through stage 4 chronic kidney disease, or unspecified chronic kidney disease: Secondary | ICD-10-CM | POA: Insufficient documentation

## 2017-09-10 DIAGNOSIS — H9201 Otalgia, right ear: Secondary | ICD-10-CM | POA: Diagnosis not present

## 2017-09-10 DIAGNOSIS — N189 Chronic kidney disease, unspecified: Secondary | ICD-10-CM | POA: Insufficient documentation

## 2017-09-10 DIAGNOSIS — E1165 Type 2 diabetes mellitus with hyperglycemia: Secondary | ICD-10-CM | POA: Diagnosis not present

## 2017-09-10 DIAGNOSIS — N183 Chronic kidney disease, stage 3 (moderate): Secondary | ICD-10-CM

## 2017-09-10 DIAGNOSIS — I10 Essential (primary) hypertension: Secondary | ICD-10-CM

## 2017-09-10 DIAGNOSIS — Z794 Long term (current) use of insulin: Secondary | ICD-10-CM | POA: Insufficient documentation

## 2017-09-10 DIAGNOSIS — K589 Irritable bowel syndrome without diarrhea: Secondary | ICD-10-CM | POA: Diagnosis not present

## 2017-09-10 DIAGNOSIS — F909 Attention-deficit hyperactivity disorder, unspecified type: Secondary | ICD-10-CM | POA: Diagnosis not present

## 2017-09-10 DIAGNOSIS — F1290 Cannabis use, unspecified, uncomplicated: Secondary | ICD-10-CM | POA: Insufficient documentation

## 2017-09-10 DIAGNOSIS — G4700 Insomnia, unspecified: Secondary | ICD-10-CM | POA: Insufficient documentation

## 2017-09-10 DIAGNOSIS — G4733 Obstructive sleep apnea (adult) (pediatric): Secondary | ICD-10-CM | POA: Insufficient documentation

## 2017-09-10 DIAGNOSIS — I5022 Chronic systolic (congestive) heart failure: Secondary | ICD-10-CM | POA: Diagnosis present

## 2017-09-10 DIAGNOSIS — J45909 Unspecified asthma, uncomplicated: Secondary | ICD-10-CM | POA: Insufficient documentation

## 2017-09-10 DIAGNOSIS — Z87891 Personal history of nicotine dependence: Secondary | ICD-10-CM | POA: Diagnosis not present

## 2017-09-10 DIAGNOSIS — I2510 Atherosclerotic heart disease of native coronary artery without angina pectoris: Secondary | ICD-10-CM | POA: Insufficient documentation

## 2017-09-10 DIAGNOSIS — F4310 Post-traumatic stress disorder, unspecified: Secondary | ICD-10-CM | POA: Diagnosis not present

## 2017-09-10 LAB — GLUCOSE, CAPILLARY: Glucose-Capillary: 162 mg/dL — ABNORMAL HIGH (ref 65–99)

## 2017-09-10 NOTE — Patient Instructions (Signed)
Continue weighing daily and call for an overnight weight gain of > 2 pounds or a weekly weight gain of >5 pounds. 

## 2017-09-10 NOTE — Progress Notes (Signed)
Patient ID: Bonnie Ochoa, female    DOB: [birth year], 52 y.o.   MRN: [id number]  HPI  Ms Standard is a 52 y/o female with a history of obstructive sleep apnea, restless leg syndrome, PTSD, HTN, DM, depression, CAD, cirrhosis, bipolar, asthma, marijuana use and chronic heart failure.   Echo report from 07/03/17 reviewed and showed an EF of 20-25%. Echo was done 05/28/16 and showed an EF of 20-25% along with moderate MR/TR and severely elevated PA pressure of 64 mm Hg.    Admitted 07/02/17 due to hyperglycemia along with acute on chronic renal failure. Cardiology and nephrology consults were obtained. Initially needed an insulin drip and then transitioned to lantus/novolog. Discharged after 3 days.   She presents today for her follow-up visit with a chief complaint of minimal fatigue upon moderate exertion. She says that she's had fatigue for many years with varying levels of severity. She has associated chest tightness, right ear pain, depression and chest pain. She denies any shortness of breath, cough, wheezing, edema, palpitations, dizziness or difficulty sleeping. Unsure of weight as she says that her scale was broke. Says that she was cleaning her ear with a q-tip and doesn't know if part of it broke off in her ear.   Past Medical History:  Diagnosis Date  . ADHD   . Arthritis   . Asthma   . Bipolar 1 disorder ([place])   . CHF (congestive heart failure) ([place])   . Cirrhosis of liver ([place])   . Coronary artery disease   . Depression   . Diabetes mellitus without complication ([place])   . Diverticulitis   . Hypertension   . IBS (irritable bowel syndrome)   . Insomnia   . Migraines   . PTSD (post-traumatic stress disorder)   . Restless leg syndrome   . Sleep apnea   . Vertigo    Past Surgical History:  Procedure Laterality Date  . ABDOMINAL HYSTERECTOMY    . debribalator  2016  . INSERT / REPLACE / REMOVE PACEMAKER    . INSERTION OF ICD    . TONSILECTOMY, ADENOIDECTOMY, BILATERAL  MYRINGOTOMY AND TUBES    . TONSILLECTOMY    . TONSILLECTOMY    . TUBAL LIGATION  1980  . TUBAL LIGATION     Family History  Problem Relation Age of Onset  . Hypertension Mother   . Hyperlipidemia Mother   . Heart disease Mother   . Hypertension Sister   . Asthma Sister   . Heart disease Sister   . Diabetes Sister   . Cancer Sister   . Alzheimer's disease Maternal Grandfather   . Hyperlipidemia Brother   . Asthma Sister   . Hypertension Sister   . Diabetes Sister    Social History   Tobacco Use  . Smoking status: Former Research scientist (life sciences)  . Smokeless tobacco: Never Used  Substance Use Topics  . Alcohol use: No   Allergies  Allergen Reactions  . Levothyroxine Rash   Prior to Admission medications   Medication Sig Start Date End Date Taking? Authorizing Provider  atorvastatin (LIPITOR) 40 MG tablet Take 1 tablet (40 mg total) by mouth daily. 08/21/16  Yes Gollan, Kathlene November, MD  blood glucose meter kit and supplies KIT Dispense based on patient and insurance preference. Use up to four times daily as directed. (FOR ICD-9 250.00, 250.01). 07/24/17  Yes Johnson, Megan P, DO  carvedilol (COREG) 6.25 MG tablet Take 1 tablet (6.25 mg total) by mouth 2 (two) times daily with a meal.  Please call to schedule appt with Dr Rockey Situ at your convenience. 07/18/17  Yes Gollan, Kathlene November, MD  dicyclomine (BENTYL) 20 MG tablet TAKE 1 TABLET (20 MG TOTAL) BY MOUTH 3 (THREE) TIMES DAILY AS NEEDED FOR SPASMS. 04/16/17  Yes Johnson, Megan P, DO  furosemide (LASIX) 20 MG tablet Take 1 tablet (20 mg total) by mouth daily. 07/05/17  Yes Vaughan Basta, MD  Insulin Glargine (LANTUS SOLOSTAR) 100 UNIT/ML Solostar Pen Inject 40 Units into the skin daily at 10 pm. 07/15/17  Yes Johnson, Megan P, DO  Insulin Pen Needle 32G X 6 MM MISC 1 each by Does not apply route daily. 07/15/17  Yes Johnson, Megan P, DO  lamoTRIgine (LAMICTAL) 25 MG tablet Take 1 tablet (25 mg total) by mouth daily. 08/16/17  Yes [name], MD   nitroGLYCERIN (NITROSTAT) 0.4 MG SL tablet Place 1 tablet (0.4 mg total) under the tongue every 5 (five) minutes as needed for chest pain. 06/04/16  Yes Gladstone Lighter, MD  ondansetron (ZOFRAN ODT) 4 MG disintegrating tablet Take 1 tablet (4 mg total) by mouth every 8 (eight) hours as needed for nausea or vomiting. 12/07/16  Yes Earleen Newport, MD  pantoprazole (PROTONIX) 40 MG tablet Take 1 tablet (40 mg total) by mouth daily. 08/28/17 08/28/18 Yes Wohl, Darren, MD  PARoxetine (PAXIL) 30 MG tablet TAKE 1 TABLET BY MOUTH EVERY DAY 06/17/17  Yes Johnson, Megan P, DO  QUEtiapine (SEROQUEL) 300 MG tablet TAKE 1 TABLET (300 MG TOTAL) BY MOUTH AT BEDTIME. 06/17/17  Yes Johnson, Megan P, DO  sacubitril-valsartan (ENTRESTO) 49-51 MG Take 1 tablet by mouth 2 (two) times daily. 04/29/17  Yes Jaydee Conran, Otila Kluver A, FNP  spironolactone (ALDACTONE) 25 MG tablet TAKE 1 TABLET (25 MG TOTAL) BY MOUTH DAILY. 07/13/17  Yes [provider]    Review of Systems  Constitutional: Positive for fatigue. Negative for appetite change.  HENT: Positive for ear pain (right). Negative for congestion, nosebleeds and sore throat.   Eyes: Negative.   Respiratory: Positive for chest tightness. Negative for shortness of breath.   Cardiovascular: Positive for chest pain (yesterday). Negative for palpitations and leg swelling.  Gastrointestinal: Negative for abdominal distention and abdominal pain.  Endocrine: Negative.   Genitourinary: Negative.   Musculoskeletal: Negative for arthralgias and neck pain.  Skin: Negative.   Allergic/Immunologic: Negative.   Neurological: Negative for dizziness and light-headedness.  Hematological: Negative for adenopathy. Does not bruise/bleed easily.  Psychiatric/Behavioral: Positive for dysphoric mood. Negative for sleep disturbance (sleeping on 2 pillows) and suicidal ideas. The patient is nervous/anxious.    Vitals:   09/10/17 1013  BP: 119/82  Pulse: 86  Resp: 18  SpO2: 99%   Weight: 209 lb 8 oz (95 kg)  Height: _0  (1.651 m)   Wt Readings from Last 3 Encounters:  09/10/17 209 lb 8 oz (95 kg)  08/22/17 203 lb 3 oz (92.2 kg)  08/19/17 205 lb 9.6 oz (93.3 kg)   Lab Results  Component Value Date   CREATININE 1.11 (H) 08/22/2017   CREATININE 1.03 (H) 08/01/2017   CREATININE 1.24 (H) 07/15/2017    Physical Exam  Constitutional: She is oriented to person, place, and time. She appears well-developed and well-nourished.  HENT:  Head: Normocephalic and atraumatic.  Neck: Normal range of motion. Neck supple. No JVD present.  Cardiovascular: Normal rate and regular rhythm.  Pulmonary/Chest: Effort normal. She has no wheezes. She has no rales.  Abdominal: Soft. She exhibits no distension. There is no tenderness.  Musculoskeletal: She exhibits no edema or tenderness.  Neurological: She is alert and oriented to person, place, and time.  Skin: Skin is warm and dry.  Psychiatric: She has a normal mood and affect. Her behavior is normal. Thought content normal.  Nursing note and vitals reviewed.   Assessment & Plan:  1: Chronic heart failure with reduced ejection fraction- - NYHA class II - euvolemic - not weighing daily as she says that her scale is broke; she says that she has the means to get a new scale. Instructed her to get the new scale soon and reminded her to call for an overnight weight gain of >2 pounds or a weekly weight gain of >5 pounds.  - weight up 6 pounds since 08/22/17 - not adding salt to her food and is trying to eat low sodium foods. Continues to read food labels so that she can keep her sodium intake to <2079m daily. - Has been trying to drink around 5-6 cups (8oz) of water a day. Discussed the importance of maintaining her fluid intake between 40-48 ounces of fluid daily.  - saw cardiologist (Rockey Situ 11/19/16 - saw EP (Caryl Comes 08/01/17 - BNP 09/17/16 was >4500.0 - PharmD reconciled medications with the patient  2: HTN- - BP looks good  today - saw PCP (Wynetta Emery 08/22/17 - BMP from 08/22/17 reviewed and showed sodium 142, potassium 4.5 and GFR 66  3: Ear pain- - says that she was cleaning her ear with a Q-tip because it was itching - now the ear hurts and she doesn't know if part of the Q-tip is stuck - we have no way of looking in the ear and she was instructed to call her PCP for an appointment to get her ear looked at  4: Diabetes- - nonfasting glucose in clinic was 162 (drank a sip of crystal light) - saw nephrologist 04/10/17 - A1c on 07/03/17 was >15.5%  5: Marijuana use- - last used marijuana today - complete cessation discussed for 3 minutes with her  Patient did not bring her medications nor a list. Each medication was verbally reviewed with the patient and she was encouraged to bring the bottles to every visit to confirm accuracy of list.  Return in 3 months or sooner for any questions/problems before then.

## 2017-09-11 DIAGNOSIS — H9201 Otalgia, right ear: Secondary | ICD-10-CM | POA: Insufficient documentation

## 2017-09-12 ENCOUNTER — Ambulatory Visit (INDEPENDENT_AMBULATORY_CARE_PROVIDER_SITE_OTHER): Payer: Medicare Other | Admitting: Family Medicine

## 2017-09-12 ENCOUNTER — Encounter: Payer: Self-pay | Admitting: Family Medicine

## 2017-09-12 VITALS — BP 109/72 | HR 89 | Temp 98.2°F | Wt 203.6 lb

## 2017-09-12 DIAGNOSIS — H60331 Swimmer's ear, right ear: Secondary | ICD-10-CM | POA: Diagnosis not present

## 2017-09-12 MED ORDER — NEOMYCIN-POLYMYXIN-HC 3.5-10000-1 OT SOLN: 3.0000 [drp] | Freq: Four times a day (QID) | OTIC | 0 refills | Status: DC

## 2017-09-12 NOTE — Progress Notes (Signed)
BP 109/72 (BP Location: Left Arm, Patient Position: Sitting, Cuff Size: Normal)   Pulse 89   Temp 98.2 F (36.8 C)   Wt 203 lb 9 oz (92.3 kg)   SpO2 99%   BMI 33.87 kg/m    Subjective:    Patient ID: Bonnie Ochoa, female    DOB: 18-Aug-1965, 52 y.o.   MRN: [id number]  HPI: Bonnie Ochoa is a 52 y.o. female  Chief Complaint  Patient presents with  . Otalgia    pain radiating through jas and neck   EAG CLOGGED Duration: 4 days Involved ear(s):  "right Sensation of feeling clogged/plugged: yes Decreased/muffled hearing:yes Ear pain: yes Fever: no Otorrhea: no Hearing loss: no Upper respiratory infection symptoms: no Using Q-Tips: yes Status: worse History of cerumenosis: no Treatments attempted: none  Relevant past medical, surgical, family and social history reviewed and updated as indicated. Interim medical history since our last visit reviewed. Allergies and medications reviewed and updated.  Review of Systems  Constitutional: Negative.   HENT: Positive for ear pain. Negative for congestion, dental problem, drooling, ear discharge, facial swelling, hearing loss, mouth sores, nosebleeds, postnasal drip, rhinorrhea, sinus pressure, sinus pain, sneezing, sore throat, tinnitus, trouble swallowing and voice change.   Eyes: Negative.   Respiratory: Negative.   Cardiovascular: Negative.   Psychiatric/Behavioral: Negative.     Per HPI unless specifically indicated above     Objective:    BP 109/72 (BP Location: Left Arm, Patient Position: Sitting, Cuff Size: Normal)   Pulse 89   Temp 98.2 F (36.8 C)   Wt 203 lb 9 oz (92.3 kg)   SpO2 99%   BMI 33.87 kg/m   Wt Readings from Last 3 Encounters:  09/12/17 203 lb 9 oz (92.3 kg)  09/10/17 209 lb 8 oz (95 kg)  08/22/17 203 lb 3 oz (92.2 kg)    Physical Exam  Constitutional: She is oriented to person, place, and time. She appears well-developed and well-nourished. No distress.  HENT:  Head: Normocephalic and  atraumatic.  Right Ear: Hearing, tympanic membrane and external ear normal.  Left Ear: Hearing, tympanic membrane and external ear normal.  Nose: Nose normal.  Mouth/Throat: Oropharynx is clear and moist. No oropharyngeal exudate.  Pus and swelling in R EAC, no sign of cotton swab  Eyes: Conjunctivae, EOM and lids are normal. Pupils are equal, round, and reactive to light. Right eye exhibits no discharge. Left eye exhibits no discharge. No scleral icterus.  Neck: Normal range of motion. Neck supple. No JVD present. No tracheal deviation present. No thyromegaly present.  Cardiovascular: Normal rate, regular rhythm, normal heart sounds and intact distal pulses. Exam reveals no gallop and no friction rub.  No murmur heard. Pulmonary/Chest: Effort normal and breath sounds normal. No stridor. No respiratory distress. She has no wheezes. She has no rales. She exhibits no tenderness.  Musculoskeletal: Normal range of motion.  Lymphadenopathy:    She has cervical adenopathy.  Neurological: She is alert and oriented to person, place, and time.  Skin: Skin is intact. No rash noted. She is not diaphoretic.  Psychiatric: She has a normal mood and affect. Her speech is normal and behavior is normal. Judgment and thought content normal. Cognition and memory are normal.  Nursing note and vitals reviewed.   Results for orders placed or performed in visit on 09/10/17  Glucose, capillary  Result Value Ref Range   Glucose-Capillary 162 (H) 65 - 99 mg/dL      Assessment & Plan:  Problem List Items Addressed This Visit    None    Visit Diagnoses    Acute swimmer's ear of right side    -  Primary   Will treat with cortisporin. Recheck Monday. Call with any concerns.        Follow up plan: Return Monday, for follow up ear.

## 2017-09-14 ENCOUNTER — Other Ambulatory Visit: Payer: Self-pay

## 2017-09-14 ENCOUNTER — Emergency Department
Admission: EM | Admit: 2017-09-14 | Discharge: 2017-09-14 | Disposition: A | Discharge status: Home / Self Care | Payer: Medicare Other | Attending: Emergency Medicine | Admitting: Emergency Medicine

## 2017-09-14 ENCOUNTER — Encounter: Payer: Self-pay | Admitting: Emergency Medicine

## 2017-09-14 DIAGNOSIS — I2510 Atherosclerotic heart disease of native coronary artery without angina pectoris: Secondary | ICD-10-CM | POA: Diagnosis not present

## 2017-09-14 DIAGNOSIS — J45909 Unspecified asthma, uncomplicated: Secondary | ICD-10-CM | POA: Diagnosis not present

## 2017-09-14 DIAGNOSIS — E1122 Type 2 diabetes mellitus with diabetic chronic kidney disease: Secondary | ICD-10-CM | POA: Insufficient documentation

## 2017-09-14 DIAGNOSIS — I130 Hypertensive heart and chronic kidney disease with heart failure and stage 1 through stage 4 chronic kidney disease, or unspecified chronic kidney disease: Secondary | ICD-10-CM | POA: Insufficient documentation

## 2017-09-14 DIAGNOSIS — Z95810 Presence of automatic (implantable) cardiac defibrillator: Secondary | ICD-10-CM | POA: Insufficient documentation

## 2017-09-14 DIAGNOSIS — E039 Hypothyroidism, unspecified: Secondary | ICD-10-CM | POA: Insufficient documentation

## 2017-09-14 DIAGNOSIS — Z79899 Other long term (current) drug therapy: Secondary | ICD-10-CM | POA: Insufficient documentation

## 2017-09-14 DIAGNOSIS — Z87891 Personal history of nicotine dependence: Secondary | ICD-10-CM | POA: Diagnosis not present

## 2017-09-14 DIAGNOSIS — H9201 Otalgia, right ear: Secondary | ICD-10-CM

## 2017-09-14 DIAGNOSIS — Z794 Long term (current) use of insulin: Secondary | ICD-10-CM | POA: Insufficient documentation

## 2017-09-14 DIAGNOSIS — H6121 Impacted cerumen, right ear: Secondary | ICD-10-CM | POA: Diagnosis not present

## 2017-09-14 DIAGNOSIS — I5022 Chronic systolic (congestive) heart failure: Secondary | ICD-10-CM | POA: Diagnosis not present

## 2017-09-14 DIAGNOSIS — N189 Chronic kidney disease, unspecified: Secondary | ICD-10-CM | POA: Diagnosis not present

## 2017-09-14 MED ORDER — AMOXICILLIN 500 MG PO CAPS: 500.0000 mg | ORAL_CAPSULE | Freq: Two times a day (BID) | ORAL | 0 refills | Status: AC

## 2017-09-14 MED ORDER — OXYCODONE-ACETAMINOPHEN 5-325 MG PO TABS
1.0000 | ORAL_TABLET | Freq: Once | ORAL | Status: AC
  Administered 2017-09-14: 1 via ORAL
  Filled 2017-09-14: qty 1

## 2017-09-14 NOTE — ED Triage Notes (Signed)
Earache R side x 4 days, no drainage.

## 2017-09-14 NOTE — ED Provider Notes (Signed)
[facility] Emergency Department Provider Note  ____________________________________________  Time seen: Approximately 6:34 PM  I have reviewed the triage vital signs and the nursing notes.   HISTORY  Chief Complaint Otalgia    HPI Bonnie Ochoa is a 52 y.o. female presents emergency department for evaluation of right ear pain for 1 week.  Her ear itches so she has been using a lot of Q-tips recently.  Patient has had more pain in right ear since using a Q-tip 1 week ago.  Patient went to her primary care doctor this week and was given eardrops without relief.  She denies fever, nasal congestion, sore throat, shortness of breath, chest pain.  Past Medical History:  Diagnosis Date  . ADHD   . Arthritis   . Asthma   . Bipolar 1 disorder ([place])   . CHF (congestive heart failure) ([place])   . Cirrhosis of liver ([place])   . Coronary artery disease   . Depression   . Diabetes mellitus without complication ([place])   . Diverticulitis   . Hypertension   . IBS (irritable bowel syndrome)   . Insomnia   . Migraines   . PTSD (post-traumatic stress disorder)   . Restless leg syndrome   . Sleep apnea   . Vertigo     Patient Active Problem List   Diagnosis Date Noted  . Ear pain, right 09/11/2017  . [place] (hyperglycemic hyperosmolar nonketotic coma) ([place]) 07/03/2017  . Hyperglycemia   . Hyperosmolar non-ketotic state in patient with type 2 diabetes mellitus ([place])   . Hypothyroid 04/30/2017  . HTN (hypertension) 03/11/2017  . Multiple thyroid nodules 01/29/2017  . Chronic systolic heart failure ([place]) 10/08/2016  . Obstructive sleep apnea 10/08/2016  . Noncompliance 09/18/2016  . Cirrhosis ([place]) 09/18/2016  . Pulmonary hypertension ([place]) 05/30/2016  . Hypokalemia 05/30/2016  . NICM (nonischemic cardiomyopathy) ([place]) 05/30/2016  . Other ascites   . Benign hypertensive renal disease   . DM (diabetes mellitus), type 2 with renal complications ([place])   . Depression    . Bipolar 1 disorder ([place])   . Asthma   . Generalized abdominal pain 05/07/2016    Past Surgical History:  Procedure Laterality Date  . ABDOMINAL HYSTERECTOMY    . debribalator  2016  . INSERT / REPLACE / REMOVE PACEMAKER    . INSERTION OF ICD    . TONSILECTOMY, ADENOIDECTOMY, BILATERAL MYRINGOTOMY AND TUBES    . TONSILLECTOMY    . TONSILLECTOMY    . TUBAL LIGATION  1980  . TUBAL LIGATION      Prior to Admission medications   Medication Sig Start Date End Date Taking? Authorizing Provider  amoxicillin (AMOXIL) 500 MG capsule Take 1 capsule (500 mg total) by mouth 2 (two) times daily for 10 days. 09/14/17 09/24/17  Laban Emperor, PA-C  atorvastatin (LIPITOR) 40 MG tablet Take 1 tablet (40 mg total) by mouth daily. 08/21/16   Minna Merritts, MD  blood glucose meter kit and supplies KIT Dispense based on patient and insurance preference. Use up to four times daily as directed. (FOR ICD-9 250.00, 250.01). 07/24/17   Park Liter P, DO  carvedilol (COREG) 6.25 MG tablet Take 1 tablet (6.25 mg total) by mouth 2 (two) times daily with a meal. Please call to schedule appt with Dr Rockey Situ at your convenience. 07/18/17   Minna Merritts, MD  dicyclomine (BENTYL) 20 MG tablet TAKE 1 TABLET (20 MG TOTAL) BY MOUTH 3 (THREE) TIMES DAILY AS NEEDED FOR SPASMS. 04/16/17  Johnson, Megan P, DO  furosemide (LASIX) 20 MG tablet Take 1 tablet (20 mg total) by mouth daily. 07/05/17   Vaughan Basta, MD  Insulin Glargine (LANTUS SOLOSTAR) 100 UNIT/ML Solostar Pen Inject 40 Units into the skin daily at 10 pm. 07/15/17   Park Liter P, DO  Insulin Pen Needle 32G X 6 MM MISC 1 each by Does not apply route daily. 07/15/17   Johnson, Megan P, DO  lamoTRIgine (LAMICTAL) 25 MG tablet Take 1 tablet (25 mg total) by mouth daily. 08/16/17   [name], MD  neomycin-polymyxin-hydrocortisone (CORTISPORIN) OTIC solution Place 3 drops into the right ear 4 (four) times daily. 09/12/17   Johnson, Megan P, DO   nitroGLYCERIN (NITROSTAT) 0.4 MG SL tablet Place 1 tablet (0.4 mg total) under the tongue every 5 (five) minutes as needed for chest pain. 06/04/16   Gladstone Lighter, MD  ondansetron (ZOFRAN ODT) 4 MG disintegrating tablet Take 1 tablet (4 mg total) by mouth every 8 (eight) hours as needed for nausea or vomiting. 12/07/16   Earleen Newport, MD  pantoprazole (PROTONIX) 40 MG tablet Take 1 tablet (40 mg total) by mouth daily. 08/28/17 08/28/18  [name], MD  PARoxetine (PAXIL) 30 MG tablet TAKE 1 TABLET BY MOUTH EVERY DAY 06/17/17   Johnson, Megan P, DO  QUEtiapine (SEROQUEL) 300 MG tablet TAKE 1 TABLET (300 MG TOTAL) BY MOUTH AT BEDTIME. 06/17/17   Johnson, Megan P, DO  sacubitril-valsartan (ENTRESTO) 49-51 MG Take 1 tablet by mouth 2 (two) times daily. 04/29/17   Alisa Graff, FNP  spironolactone (ALDACTONE) 25 MG tablet TAKE 1 TABLET (25 MG TOTAL) BY MOUTH DAILY. 07/13/17   [provider]    Allergies Levothyroxine  Family History  Problem Relation Age of Onset  . Hypertension Mother   . Hyperlipidemia Mother   . Heart disease Mother   . Hypertension Sister   . Asthma Sister   . Heart disease Sister   . Diabetes Sister   . Cancer Sister   . Alzheimer's disease Maternal Grandfather   . Hyperlipidemia Brother   . Asthma Sister   . Hypertension Sister   . Diabetes Sister     Social History Social History   Tobacco Use  . Smoking status: Former Research scientist (life sciences)  . Smokeless tobacco: Never Used  Substance Use Topics  . Alcohol use: No  . Drug use: Yes    Types: Marijuana    Comment: 1/29 smoked today     Review of Systems  Constitutional: No fever/chills Cardiovascular: No chest pain. Respiratory: No SOB. Gastrointestinal: No abdominal pain.  No nausea, no vomiting.  Musculoskeletal: Negative for musculoskeletal pain. Skin: Negative for rash, abrasions, lacerations, ecchymosis. Neurological: Negative for  headaches   ____________________________________________   PHYSICAL EXAM:  VITAL SIGNS: ED Triage Vitals  Enc Vitals Group     BP 09/14/17 1730 (!) 140/97     Pulse Rate 09/14/17 1730 80     Resp 09/14/17 1730 18     Temp 09/14/17 1730 98.3 F (36.8 C)     Temp Source 09/14/17 1730 Oral     SpO2 09/14/17 1730 98 %     Weight 09/14/17 1732 200 lb (90.7 kg)     Height 09/14/17 1732 5' 5"  (1.651 m)     Head Circumference --      Peak Flow --      Pain Score 09/14/17 1732 10     Pain Loc --      Pain Edu? --  Excl. in [place]? --      Constitutional: Alert and oriented. Well appearing and in no acute distress. Eyes: Conjunctivae are normal. PERRL. EOMI. Head: Atraumatic. ENT:      Ears: No tenderness to palpation of pinna or tragus.  Significant amount of wax in right ear.  Unable to visualize tympanic membrane.  Left tympanic membrane pearly.      Nose: No congestion/rhinnorhea.      Mouth/Throat: Mucous membranes are moist.  Neck: No stridor.  Cardiovascular: Normal rate, regular rhythm.  Good peripheral circulation. Respiratory: Normal respiratory effort without tachypnea or retractions. Lungs CTAB. Good air entry to the bases with no decreased or absent breath sounds. Musculoskeletal: Full range of motion to all extremities. No gross deformities appreciated. Neurologic:  Normal speech and language. No gross focal neurologic deficits are appreciated.  Skin:  Skin is warm, dry and intact. No rash noted. Psychiatric: Mood and affect are normal. Speech and behavior are normal. Patient exhibits appropriate insight and judgement.   ____________________________________________   LABS (all labs ordered are listed, but only abnormal results are displayed)  Labs Reviewed - No data to display ____________________________________________  EKG   ____________________________________________  RADIOLOGY   No results  found.  ____________________________________________    PROCEDURES  Procedure(s) performed:    Procedures    Medications  oxyCODONE-acetaminophen (PERCOCET/ROXICET) 5-325 MG per tablet 1 tablet (not administered)     ____________________________________________   INITIAL IMPRESSION / ASSESSMENT AND PLAN / ED COURSE  Pertinent labs & imaging results that were available during my care of the patient were reviewed by me and considered in my medical decision making (see chart for details).  Review of the [place] CSRS was performed in accordance of the [place] prior to dispensing any controlled drugs.     Patient presented to the emergency department for evaluation of right ear pain for 1 week.  Vital signs and exam are reassuring.  I am unable to visualize right tympanic membrane due to ear wax.  It is possible that patient perforated tympanic membrane since pain began after using Q-tip.  Patient will be discharged home with prescriptions for amoxicillin. Patient is to follow up with ENT for earwax removal.  Patient is given ED precautions to return to the ED for any worsening or new symptoms.     ____________________________________________  FINAL CLINICAL IMPRESSION(S) / ED DIAGNOSES  Final diagnoses:  Otalgia of right ear  Impacted cerumen of right ear      NEW MEDICATIONS STARTED DURING THIS VISIT:  ED Discharge Orders        Ordered    amoxicillin (AMOXIL) 500 MG capsule  2 times daily     09/14/17 1831          This chart was dictated using voice recognition software/Dragon. Despite best efforts to proofread, errors can occur which can change the meaning. Any change was purely unintentional.    Laban Emperor, PA-C 09/14/17 1900    Harvest Dark, MD 09/15/17 1426

## 2017-09-14 NOTE — ED Notes (Signed)
Pt discharged to home.  Family member driving.  Discharge instructions reviewed.  Verbalized understanding.  No questions or concerns at this time.  Teach back verified.  Pt in NAD.  No items left in ED.   

## 2017-09-15 ENCOUNTER — Encounter: Payer: Self-pay | Admitting: Family Medicine

## 2017-09-15 DIAGNOSIS — H9201 Otalgia, right ear: Secondary | ICD-10-CM

## 2017-09-16 ENCOUNTER — Other Ambulatory Visit: Payer: Self-pay

## 2017-09-16 ENCOUNTER — Ambulatory Visit: Payer: Medicare Other | Admitting: Family Medicine

## 2017-09-16 ENCOUNTER — Ambulatory Visit (INDEPENDENT_AMBULATORY_CARE_PROVIDER_SITE_OTHER): Payer: Medicare Other | Admitting: Psychiatry

## 2017-09-16 ENCOUNTER — Encounter: Payer: Self-pay | Admitting: Psychiatry

## 2017-09-16 VITALS — BP 112/78 | HR 96 | Temp 98.5°F | Wt 207.4 lb

## 2017-09-16 DIAGNOSIS — F410 Panic disorder [episodic paroxysmal anxiety] without agoraphobia: Secondary | ICD-10-CM

## 2017-09-16 DIAGNOSIS — G4733 Obstructive sleep apnea (adult) (pediatric): Secondary | ICD-10-CM | POA: Diagnosis not present

## 2017-09-16 DIAGNOSIS — F1021 Alcohol dependence, in remission: Secondary | ICD-10-CM

## 2017-09-16 DIAGNOSIS — F3130 Bipolar disorder, current episode depressed, mild or moderate severity, unspecified: Secondary | ICD-10-CM

## 2017-09-16 DIAGNOSIS — Z9989 Dependence on other enabling machines and devices: Secondary | ICD-10-CM | POA: Diagnosis not present

## 2017-09-16 DIAGNOSIS — F4312 Post-traumatic stress disorder, chronic: Secondary | ICD-10-CM

## 2017-09-16 DIAGNOSIS — F1220 Cannabis dependence, uncomplicated: Secondary | ICD-10-CM | POA: Diagnosis not present

## 2017-09-16 MED ORDER — QUETIAPINE FUMARATE 300 MG PO TABS: 300.0000 mg | ORAL_TABLET | Freq: Every day | ORAL | 0 refills | Status: DC

## 2017-09-16 MED ORDER — PAROXETINE HCL 30 MG PO TABS: 30.0000 mg | ORAL_TABLET | Freq: Every day | ORAL | 0 refills | Status: DC

## 2017-09-16 MED ORDER — LAMOTRIGINE 25 MG PO TABS: 25.0000 mg | ORAL_TABLET | Freq: Two times a day (BID) | ORAL | 1 refills | Status: DC
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
## 2017-09-16 NOTE — Progress Notes (Signed)
  THERAPIST PROGRESS NOTE   Date of Service:   09/09/2017   Session Time:    Patient:   Bonnie Ochoa   DOB:   August 19, 1965  MR Number:  [id number]  Location:  [facility] [address] [place] [address] Dept: [phone]            Provider/Observer:  Marinda Elk Counselor  Risk of Suicide/Violence: low  Diagnosis:    No diagnosis found.  Type of Therapy: Individual Therapy  Treatment Goals addressed: Coping  Participation Level: Active    Behavioral Observation: Saly Hoskin  presents as a 52 y.o.-year-old female who continues to display symptoms of her diagnosis  Interventions: CBT and Motivational Interviewing   Behavioral Response: CasualAlertEuthymic   Summary: Patient reports a cycling mood.  She reports that she has cried on several occassions due to her mother's death.  She reports that her mother died several years ago and she believes that she should be passed it.  She reports that she picked up her phone and attempted to contact her mother.  She reports that she wants to improve her communication with her significant other. Explored coping skills and assisted Patient with chossing 2 coping skills to assist with her mood. LCSW discussed what psychotherapy is and is not and the importance of the therapeutic relationship to include open and honest communication between client and therapist and building trust.  Reviewed advantages and disadvantages of the therapeutic process and limitations to the therapeutic relationship including LCSW's role in maintaining the safety of the client, others and those in client's care.     Plan: Laurah Bartold will use coping skills learned to reduce symptoms.    Return again in 2 weeks.

## 2017-09-16 NOTE — Patient Instructions (Signed)
Cannabis Use Disorder Cannabis use disorder is when using marijuana disrupts a person's daily life or causes health problems. This condition can be dangerous. The health problems this condition can cause include:  Long-lasting problems with thinking and learning. These can be permanent in young people.  Severe anxiety.  Paranoia.  Hallucinations.  Dangerously high blood pressure and heart rate.  Schizophrenia.  Breathing problems.  Problems with child development during and after pregnancy.  People with this condition are also more likely to use other drugs. What are the causes? This condition is caused by using marijuana too much over time. It is not caused by using it only once in a while. Many people with this condition use marijuana because it gives them a feeling of extreme pleasure or relaxation. What increases the risk? This condition is more likely to develop in:  Men.  People with a family history of cannabis use disorder.  People with mental health issues such as depression or post-traumatic stress disorder.  What are the signs or symptoms? Symptoms of this condition include:  Using greater amounts of marijuana than you want to, or using marijuana for longer than you want to.  Craving marijuana.  Spending a lot of time getting marijuana and using it or recovering from its effects.  Having problems at work, at school, at home, or in relationships because of marijuana use.  Giving up or cutting down on important life activities because of marijuana use.  Using marijuana at times when it is dangerous, such as while you are driving a car.  Needing more and more marijuana to get the same effect you want from the marijuana (building up a tolerance).  Physical problems, such as: ? A long-lasting cough. ? Bronchitis. ? Emphysema. ? Throat and lung cancer.  Mental problems, such as: ? Psychosis. ? Anxiety. ? Trouble sleeping.  Having symptoms of withdrawal  when you stop using marijuana. Symptom of withdrawal include: ? Irritability or anger. ? Anxiety or restlessness. ? Trouble sleeping. ? Loss of appetite or weight loss. ? Aches and pains. ? Shakiness, sweating, or chills.  How is this diagnosed? This condition is diagnosed with an assessment. During the assessment, your health care provider will ask about your marijuana use and about how it affects your life. You will be diagnosed with the condition if you have had at least two symptoms of this condition within a 12-month period. How severe the condition is depends on how many symptoms you have.  If you have two to three symptoms, your condition is mild.  If you have four to five symptoms, your condition is moderate.  If you have six or more symptoms, your condition is severe.  Your health care provider may perform a physical exam or do lab tests to see if you have physical problems resulting from marijuana use. Your health care provider may also screen for drug use and refer you to a mental health professional for evaluation. How is this treated? Treatment for this condition is usually provided by mental health professionals with training in substance use disorders. Your treatment may involve:  Counseling. This treatment is also called talk therapy. It is provided by substance use treatment counselors. A counselor can address the reasons you use marijuana and suggest ways to keep you from using it again. The goals of talk therapy are to: ? Find healthy activities to replace using marijuana. ? Identify and avoid the things that trigger your marijuana use. ? Help you learn how to handle   cravings.  Support groups. Support groups are led by people who have quit using marijuana. They provide emotional support, advice, and guidance.  Medicine. Medicine is used to treat mental health issues that trigger marijuana use or that result from it.  Follow these instructions at home:  Take  over-the-counter and prescription medicines only as told by your health care provider.  Check with your health care provider before starting any new medicines.  Keep all follow-up visits as told by your health care provider. This is important.  Work with your counselor or group to develop tools to keep you from using marijuana again (relapsing).  Make healthy lifestyle choices, such as: ? Eating a healthy diet. ? Getting enough exercise. ? Improving your stress-management skills.  Learn daily living skills and work skills. Where to find more information:  National Institute on Drug Abuse: www.drugabuse.gov  Substance Abuse and Mental Health Services Administration: www.samhsa.gov Contact a health care provider if:  You are not able to take your medicines as told.  Your symptoms get worse. Get help right away if:  You have serious thoughts about hurting yourself or others. If you ever feel like you may hurt yourself or others, or have thoughts about taking your own life, get help right away. You can go to your nearest emergency department or call:  Your local emergency services (911 in the [place]  A suicide crisis helpline, such as the National Suicide Prevention Lifeline at 1-800-273-8255. This is open 24 hours a day.  This information is not intended to replace advice given to you by your health care provider. Make sure you discuss any questions you have with your health care provider. Document Released: 07/27/2000 Document Revised: 04/27/2016 Document Reviewed: 04/27/2016 Elsevier Interactive Patient Education  2018 Elsevier Inc.  

## 2017-09-18 ENCOUNTER — Ambulatory Visit: Payer: Medicare Other | Admitting: Gastroenterology

## 2017-09-19 ENCOUNTER — Other Ambulatory Visit: Payer: Self-pay | Admitting: Cardiovascular Disease

## 2017-09-21 ENCOUNTER — Other Ambulatory Visit: Payer: Self-pay | Admitting: Cardiovascular Disease

## 2017-09-24 ENCOUNTER — Telehealth: Payer: Self-pay | Admitting: Cardiovascular Disease

## 2017-09-24 NOTE — Telephone Encounter (Signed)
-----   Message from Margrett Rud, [place] sent at 09/23/2017 10:00 AM EST ----- Regarding: Patient needs an appointment  Can you try to schedule an appointment with Dr. Mariah Milling, Thanks !

## 2017-09-24 NOTE — Telephone Encounter (Signed)
No ans no vm   °

## 2017-09-25 LAB — CUP PACEART REMOTE DEVICE CHECK
Battery Remaining Longevity: 85 mo
Battery Remaining Percentage: 77 %
Battery Voltage: 3.02 V
Brady Statistic RV Percent Paced: 1 %
Date Time Interrogation Session: 20190121070023
HIGH POWER IMPEDANCE MEASURED VALUE: 71 Ohm
HIGH POWER IMPEDANCE MEASURED VALUE: 71 Ohm
Implantable Lead Implant Date: 20160410
Lead Channel Impedance Value: 410 Ohm
Lead Channel Pacing Threshold Amplitude: 1 V
Lead Channel Pacing Threshold Pulse Width: 0.5 ms
Lead Channel Sensing Intrinsic Amplitude: 11.8 mV
Lead Channel Setting Sensing Sensitivity: 0.5 mV
MDC IDC LEAD LOCATION: 753860
MDC IDC PG IMPLANT DT: 20160410
MDC IDC PG SERIAL: 7259964
MDC IDC SET LEADCHNL RV PACING AMPLITUDE: 2.5 V
MDC IDC SET LEADCHNL RV PACING PULSEWIDTH: 0.5 ms

## 2017-09-26 ENCOUNTER — Other Ambulatory Visit: Payer: Self-pay | Admitting: Cardiovascular Disease

## 2017-09-26 ENCOUNTER — Other Ambulatory Visit: Payer: Self-pay | Admitting: *Deleted

## 2017-09-26 NOTE — Telephone Encounter (Signed)
Patient needs a follow up with Dr. Mariah Milling before refills given of atorvastatin.  Please advise.

## 2017-09-26 NOTE — Telephone Encounter (Signed)
Patient will need appointment before we send in any further refills.

## 2017-09-30 ENCOUNTER — Ambulatory Visit (INDEPENDENT_AMBULATORY_CARE_PROVIDER_SITE_OTHER): Payer: Medicare Other | Admitting: Licensed Clinical Social Worker

## 2017-09-30 DIAGNOSIS — F4312 Post-traumatic stress disorder, chronic: Secondary | ICD-10-CM | POA: Diagnosis not present

## 2017-09-30 DIAGNOSIS — F3130 Bipolar disorder, current episode depressed, mild or moderate severity, unspecified: Secondary | ICD-10-CM | POA: Diagnosis not present

## 2017-09-30 NOTE — Telephone Encounter (Signed)
Lmov for patient to call and schedule appointment ° °

## 2017-10-01 NOTE — Telephone Encounter (Signed)
Attempted to schedule an appt.  LMOV . 3 attempts to schedule fu appt.  Mailed letter

## 2017-10-03 ENCOUNTER — Ambulatory Visit (INDEPENDENT_AMBULATORY_CARE_PROVIDER_SITE_OTHER): Payer: Medicare Other

## 2017-10-03 DIAGNOSIS — I5022 Chronic systolic (congestive) heart failure: Secondary | ICD-10-CM

## 2017-10-03 DIAGNOSIS — Z95810 Presence of automatic (implantable) cardiac defibrillator: Secondary | ICD-10-CM | POA: Diagnosis not present

## 2017-10-04 NOTE — Progress Notes (Signed)
EPIC Encounter for ICM Monitoring  Patient Name: Bonnie Ochoa is a 52 y.o. female Date: 10/04/2017 Primary Care Physican: Valerie Roys, DO Primary Cardiologist: Sanjuana Letters, NP Electrophysiologist: Faustino Congress Weight:Previous weight 197 lbs       Attempted call to patient and unable to reach.  Left detailed message regarding transmission.  Transmission reviewed.    Thoracic impedance normal.  Prescribed dosage: Furosemide 40 mg 1.5 tablets (60 mg total) by mouth 2 (two) times daily. Potassium 84mq take 1tablet daily.   Labs: 02/20/2017 Creatinine 1.91, BUN 61, Potassium 4.5, Sodium 134, EGFR 29-34 02/01/2017 Creatinine 1.47, BUN 25, Potassium 4.6, Sodium 138, EGFR 40-47 12/28/2016 Creatinine 1.28, BUN 34, Potassium 5.2, Sodium 139, EGFR 48-55 12/08/2016 Creatinine 1.15, BUN 30, Potassium 3.9, Sodium 138, EGFR 54->60 12/07/2016 Creatinine 1.14, BUN 32, Potassium 4.2, Sodium 135, EGFR 52->60 12/04/2016 Creatinine 1.18, BUN 30, Potassium 3.9, Sodium 137, EGFR 54->60 11/05/2016 Creatinine 0.81, BUN 22, Potassium 3.9, Sodium 136, EGFR >60 09/18/2016 Creatinine 1.34, BUN 28, Potassium 3.3, Sodium 138, EGFR 45-52  09/17/2016 Creatinine 1.20, BUN 24, Potassium 3.4, Sodium 138, EGFR 51-59  09/11/2016 Creatinine 1.14, BUN 22, Potassium 3.3, Sodium 138, EGFR 54->60  01/28/2018Creatinine 1.13, BUN 24, Potassium 3.6, Sodium 136, EGFR 55->60  Recommendations: Left voice mail with ICM number and encouraged to call if experiencing any fluid symptoms.  Follow-up plan: ICM clinic phone appointment on 11/04/2017.   Copy of ICM check sent to Dr. KCaryl Comes   3 month ICM trend: 10/03/2017    1 Year ICM trend:       LRosalene Billings RN 10/04/2017 2:27 PM

## 2017-10-10 ENCOUNTER — Other Ambulatory Visit: Payer: Self-pay | Admitting: Cardiovascular Disease

## 2017-10-14 ENCOUNTER — Ambulatory Visit: Payer: Medicare Other | Admitting: Psychiatry

## 2017-10-16 ENCOUNTER — Other Ambulatory Visit: Payer: Self-pay

## 2017-10-16 ENCOUNTER — Ambulatory Visit (INDEPENDENT_AMBULATORY_CARE_PROVIDER_SITE_OTHER): Payer: Medicare Other | Admitting: Psychiatry

## 2017-10-16 ENCOUNTER — Encounter: Payer: Self-pay | Admitting: Psychiatry

## 2017-10-16 VITALS — BP 112/76 | Temp 97.9°F

## 2017-10-16 DIAGNOSIS — G4733 Obstructive sleep apnea (adult) (pediatric): Secondary | ICD-10-CM | POA: Diagnosis not present

## 2017-10-16 DIAGNOSIS — Z9989 Dependence on other enabling machines and devices: Secondary | ICD-10-CM | POA: Diagnosis not present

## 2017-10-16 DIAGNOSIS — F4312 Post-traumatic stress disorder, chronic: Secondary | ICD-10-CM

## 2017-10-16 DIAGNOSIS — F410 Panic disorder [episodic paroxysmal anxiety] without agoraphobia: Secondary | ICD-10-CM

## 2017-10-16 DIAGNOSIS — F3130 Bipolar disorder, current episode depressed, mild or moderate severity, unspecified: Secondary | ICD-10-CM

## 2017-10-16 DIAGNOSIS — F1220 Cannabis dependence, uncomplicated: Secondary | ICD-10-CM

## 2017-10-16 DIAGNOSIS — F1021 Alcohol dependence, in remission: Secondary | ICD-10-CM

## 2017-10-16 MED ORDER — LAMOTRIGINE 25 MG PO TABS: 25.0000 mg | ORAL_TABLET | Freq: Two times a day (BID) | ORAL | 1 refills | Status: DC
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
## 2017-10-17 ENCOUNTER — Other Ambulatory Visit: Payer: Self-pay | Admitting: Cardiovascular Disease

## 2017-10-18 ENCOUNTER — Telehealth: Payer: Self-pay

## 2017-10-18 NOTE — Telephone Encounter (Signed)
-----   Message from Antonieta Iba, MD sent at 10/18/2017  3:52 PM EST ----- High risk of CHF readmissions Last seen by me April 2018 Needs follow-up Was not responding to phone calls to schedule Other family member (partner) we can call ? TG

## 2017-10-18 NOTE — Telephone Encounter (Signed)
lmom to schedule

## 2017-10-21 NOTE — Telephone Encounter (Signed)
No ans no vm   °

## 2017-10-22 ENCOUNTER — Encounter: Payer: Self-pay | Admitting: Cardiovascular Disease

## 2017-10-22 NOTE — Telephone Encounter (Signed)
No ans no vm   Sending out letter.

## 2017-10-28 ENCOUNTER — Ambulatory Visit (INDEPENDENT_AMBULATORY_CARE_PROVIDER_SITE_OTHER): Payer: Medicare Other | Admitting: Licensed Clinical Social Worker

## 2017-10-28 DIAGNOSIS — F3130 Bipolar disorder, current episode depressed, mild or moderate severity, unspecified: Secondary | ICD-10-CM | POA: Diagnosis not present

## 2017-11-01 ENCOUNTER — Other Ambulatory Visit: Payer: Self-pay | Admitting: Cardiovascular Disease

## 2017-11-01 NOTE — Telephone Encounter (Signed)
Please advise if you would like me to refill this medication for patient.  Patient was last seen by Dr. Mariah Milling 11/18/2016 and was to follow up in 3 months which the patient never did Seen Graciela Husbands 08/01/2017 with a year follow up. We sent a refill for and instructed her to follow up before receiving any other refills.

## 2017-11-01 NOTE — Telephone Encounter (Signed)
No answer. Left message to call back to schedule an appointment to see Dr Mariah Milling as she is overdue.

## 2017-11-04 ENCOUNTER — Ambulatory Visit (INDEPENDENT_AMBULATORY_CARE_PROVIDER_SITE_OTHER): Payer: Medicare Other

## 2017-11-04 DIAGNOSIS — Z95810 Presence of automatic (implantable) cardiac defibrillator: Secondary | ICD-10-CM | POA: Diagnosis not present

## 2017-11-04 DIAGNOSIS — I5022 Chronic systolic (congestive) heart failure: Secondary | ICD-10-CM | POA: Diagnosis not present

## 2017-11-04 NOTE — Progress Notes (Signed)
EPIC Encounter for ICM Monitoring  Patient Name: Bonnie Ochoa is a 52 y.o. female Date: 11/04/2017 Primary Care Physican: Valerie Roys, DO Primary Cardiologist: Sanjuana Letters, NP Electrophysiologist: Faustino Congress Weight:Previous weight 197lbs       Attempted call to patient and unable to reach.  Left detailed message regarding transmission.  Transmission reviewed.    Thoracic impedance normal.  Prescribed dosage: Furosemide 40 mg 1.5 tablets (60 mg total) by mouth 2 (two) times daily. Potassium 74mq take 1tablet daily.   Labs: 02/20/2017 Creatinine 1.91, BUN 61, Potassium 4.5, Sodium 134, EGFR 29-34 02/01/2017 Creatinine 1.47, BUN 25, Potassium 4.6, Sodium 138, EGFR 40-47 12/28/2016 Creatinine 1.28, BUN 34, Potassium 5.2, Sodium 139, EGFR 48-55 12/08/2016 Creatinine 1.15, BUN 30, Potassium 3.9, Sodium 138, EGFR 54->60 12/07/2016 Creatinine 1.14, BUN 32, Potassium 4.2, Sodium 135, EGFR 52->60 12/04/2016 Creatinine 1.18, BUN 30, Potassium 3.9, Sodium 137, EGFR 54->60 11/05/2016 Creatinine 0.81, BUN 22, Potassium 3.9, Sodium 136, EGFR >60 09/18/2016 Creatinine 1.34, BUN 28, Potassium 3.3, Sodium 138, EGFR 45-52  09/17/2016 Creatinine 1.20, BUN 24, Potassium 3.4, Sodium 138, EGFR 51-59  09/11/2016 Creatinine 1.14, BUN 22, Potassium 3.3, Sodium 138, EGFR 54->60  01/28/2018Creatinine 1.13, BUN 24, Potassium 3.6, Sodium 136, EGFR 55->60  Recommendations: Left voice mail with ICM number and encouraged to call if experiencing any fluid symptoms.  Follow-up plan: ICM clinic phone appointment on 12/05/2017.  Due to schedule to appointment with Dr GRockey Situ Copy of ICM check sent to Dr. KCaryl Comes   3 month ICM trend: 11/04/2017    1 Year ICM trend:       LRosalene Billings RN 11/04/2017 9:43 AM
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
## 2017-11-04 NOTE — Telephone Encounter (Signed)
Patient will need appointment before any further refills.

## 2017-11-05 ENCOUNTER — Telehealth: Payer: Self-pay

## 2017-11-05 NOTE — Telephone Encounter (Signed)
Remote ICM transmission received.  Attempted call to patient and left detailed message per DPR regarding transmission and next ICM scheduled for 12/05/2017.  Advised to return call for any fluid symptoms or questions.    

## 2017-11-14 ENCOUNTER — Emergency Department: Payer: Medicare Other

## 2017-11-14 ENCOUNTER — Encounter: Payer: Self-pay | Admitting: Emergency Medicine

## 2017-11-14 DIAGNOSIS — E039 Hypothyroidism, unspecified: Secondary | ICD-10-CM | POA: Insufficient documentation

## 2017-11-14 DIAGNOSIS — I5022 Chronic systolic (congestive) heart failure: Secondary | ICD-10-CM | POA: Diagnosis not present

## 2017-11-14 DIAGNOSIS — I2510 Atherosclerotic heart disease of native coronary artery without angina pectoris: Secondary | ICD-10-CM | POA: Diagnosis not present

## 2017-11-14 DIAGNOSIS — N189 Chronic kidney disease, unspecified: Secondary | ICD-10-CM | POA: Insufficient documentation

## 2017-11-14 DIAGNOSIS — R0789 Other chest pain: Secondary | ICD-10-CM | POA: Insufficient documentation

## 2017-11-14 DIAGNOSIS — Z79899 Other long term (current) drug therapy: Secondary | ICD-10-CM | POA: Diagnosis not present

## 2017-11-14 DIAGNOSIS — Z87891 Personal history of nicotine dependence: Secondary | ICD-10-CM | POA: Diagnosis not present

## 2017-11-14 DIAGNOSIS — I130 Hypertensive heart and chronic kidney disease with heart failure and stage 1 through stage 4 chronic kidney disease, or unspecified chronic kidney disease: Secondary | ICD-10-CM | POA: Insufficient documentation

## 2017-11-14 DIAGNOSIS — J45909 Unspecified asthma, uncomplicated: Secondary | ICD-10-CM | POA: Insufficient documentation

## 2017-11-14 DIAGNOSIS — R079 Chest pain, unspecified: Secondary | ICD-10-CM | POA: Diagnosis not present

## 2017-11-14 DIAGNOSIS — M791 Myalgia, unspecified site: Secondary | ICD-10-CM | POA: Insufficient documentation

## 2017-11-14 DIAGNOSIS — Z95810 Presence of automatic (implantable) cardiac defibrillator: Secondary | ICD-10-CM | POA: Insufficient documentation

## 2017-11-14 DIAGNOSIS — Z794 Long term (current) use of insulin: Secondary | ICD-10-CM | POA: Diagnosis not present

## 2017-11-14 DIAGNOSIS — E1122 Type 2 diabetes mellitus with diabetic chronic kidney disease: Secondary | ICD-10-CM | POA: Insufficient documentation

## 2017-11-14 LAB — CBC
HCT: 32.6 % — ABNORMAL LOW (ref 35.0–47.0)
HEMOGLOBIN: 11.4 g/dL — AB (ref 12.0–16.0)
MCH: 31.9 pg (ref 26.0–34.0)
MCHC: 35 g/dL (ref 32.0–36.0)
MCV: 91.1 fL (ref 80.0–100.0)
PLATELETS: 246 10*3/uL (ref 150–440)
RBC: 3.57 MIL/uL — AB (ref 3.80–5.20)
RDW: 14 % (ref 11.5–14.5)
WBC: 5.3 10*3/uL (ref 3.6–11.0)

## 2017-11-14 NOTE — ED Triage Notes (Signed)
Patient arrived via EMS with co CP rated 10/10 left side chest radiating to left arm.  Patient has hx of hypertension and diabetes.  She states she took her medicine today.  Patient in NAD at this time.  She also says she is having generalized pains in her right side, abdomen, and head.

## 2017-11-14 NOTE — ED Notes (Signed)
Green top recollected and sent to lab.  Labels for this patient will not print, sent white patient label on tube.

## 2017-11-14 NOTE — ED Notes (Signed)
Lab called to report green tube hemolyzed and needs to be recollected

## 2017-11-14 NOTE — ED Notes (Signed)
EMS pt to lobby, Chest pain and abd pain x1 week, vitals WNL , EKG per EMS WNL , IV (20 gauge left hand)

## 2017-11-14 NOTE — ED Notes (Signed)
Light green recollect, Penni Bombard RN aware

## 2017-11-14 NOTE — ED Notes (Signed)
XR notified patient is ready for scan

## 2017-11-14 NOTE — ED Notes (Signed)
Pt to triage via w/c with no distress noted; EMS reports went out on call for her husband and upon loading pt, she began c/o CP and abd pain x week; 20GA SL to left hand; card hx

## 2017-11-15 ENCOUNTER — Emergency Department
Admission: EM | Admit: 2017-11-15 | Discharge: 2017-11-15 | Disposition: A | Discharge status: Home / Self Care | Payer: Medicare Other | Attending: Emergency Medicine | Admitting: Emergency Medicine

## 2017-11-15 DIAGNOSIS — R0789 Other chest pain: Secondary | ICD-10-CM

## 2017-11-15 LAB — COMPREHENSIVE METABOLIC PANEL
ALBUMIN: 3.9 g/dL (ref 3.5–5.0)
ALT: 16 U/L (ref 14–54)
ANION GAP: 8 (ref 5–15)
AST: 21 U/L (ref 15–41)
Alkaline Phosphatase: 121 U/L (ref 38–126)
BILIRUBIN TOTAL: 0.8 mg/dL (ref 0.3–1.2)
BUN: 19 mg/dL (ref 6–20)
CO2: 26 mmol/L (ref 22–32)
Calcium: 9 mg/dL (ref 8.9–10.3)
Chloride: 105 mmol/L (ref 101–111)
Creatinine, Ser: 0.97 mg/dL (ref 0.44–1.00)
GFR calc Af Amer: >60 mL/min (ref >60)
GLUCOSE: 215 mg/dL — AB (ref 65–99)
POTASSIUM: 3.1 mmol/L — AB (ref 3.5–5.1)
Sodium: 139 mmol/L (ref 135–145)
TOTAL PROTEIN: 7.9 g/dL (ref 6.5–8.1)

## 2017-11-15 LAB — LIPASE, BLOOD: Lipase: 34 U/L (ref 11–51)

## 2017-11-15 LAB — TROPONIN I
TROPONIN I: 0.03 ng/mL — AB (ref <0.03)
Troponin I: 0.03 ng/mL (ref <0.03)

## 2017-11-15 MED ORDER — KETOROLAC TROMETHAMINE 30 MG/ML IJ SOLN
15.0000 mg | Freq: Once | INTRAMUSCULAR | Status: AC
  Administered 2017-11-15: 15 mg via INTRAVENOUS
  Filled 2017-11-15: qty 1

## 2017-11-15 NOTE — Discharge Instructions (Signed)
You have been seen in the Emergency Department (ED) today for chest pain.  As we have discussed today's test results are normal, and we believe your pain is due to pain/strain and/or inflammation of the muscles and/or cartilage of your chest wall.  We recommend you take ibuprofen 600 mg three times a day with meals for the next 5 days (unless you have been told previously not to take ibuprofen or NSAIDs in general).  You may also take Tylenol according to the label instructions.  Read through the included information for additional treatment recommendations and precautions.  Continue to take your regular medications.   Return to the Emergency Department (ED) if you experience any further chest pain/pressure/tightness, difficulty breathing, or sudden sweating, or other symptoms that concern you.  

## 2017-11-15 NOTE — ED Provider Notes (Signed)
[facility] Emergency Department Provider Note  ____________________________________________   First MD Initiated Contact with Patient 11/15/17 [phone]     (approximate)  I have reviewed the triage vital signs and the nursing notes.   HISTORY  Chief Complaint Chest Pain    HPI [name] is a 52 y.o. female who presents for evaluation of chest pain.  She states that this is happened multiple times in the past.  Any amount of movement causes additional pain in her chest and it is very sore to the touch.  She and the family member with her report that she has recently started exercising and moving around more and getting some exercise and she had forgotten about that when she was first concerned about the pain in her chest.  She denies any difficulty breathing although states that the chest pain becomes worse with deep breaths and any moving around.  She states that every time this is happened in the past we have always told her that nothing is wrong.  She does have a cardiologist and in fact she has a defibrillator that was placed about 4 years ago because of "enlarged heart".  She states that she has both high blood pressure and low blood pressure.  She is a former smoker and reports that she has some lung disease but her breathing is not been worse than usual recently.  She denies fever/chills, nausea, vomiting, abdominal pain.  She does state that her whole body hurts and that she feels like she has muscle aches everywhere, but she came to the realization about her recent exercise after she told me all this, and when she realized her recent exercise, she literally slept her forehead and said "I forgot about that!".   Past Medical History:  Diagnosis Date  . ADHD   . Arthritis   . Asthma   . Bipolar 1 disorder ([place])   . CHF (congestive heart failure) ([place])   . Cirrhosis of liver ([place])   . Coronary artery disease   . Depression   . Diabetes mellitus without  complication ([place])   . Diverticulitis   . Hypertension   . IBS (irritable bowel syndrome)   . Insomnia   . Migraines   . PTSD (post-traumatic stress disorder)   . Restless leg syndrome   . Sleep apnea   . Vertigo     Patient Active Problem List   Diagnosis Date Noted  . Ear pain, right 09/11/2017  . [place] (hyperglycemic hyperosmolar nonketotic coma) ([place]) 07/03/2017  . Hyperglycemia   . Hyperosmolar non-ketotic state in patient with type 2 diabetes mellitus ([place])   . Hypothyroid 04/30/2017  . HTN (hypertension) 03/11/2017  . Multiple thyroid nodules 01/29/2017  . Chronic systolic heart failure ([place]) 10/08/2016  . Obstructive sleep apnea 10/08/2016  . Noncompliance 09/18/2016  . Cirrhosis ([place]) 09/18/2016  . Pulmonary hypertension ([place]) 05/30/2016  . Hypokalemia 05/30/2016  . NICM (nonischemic cardiomyopathy) ([place]) 05/30/2016  . Other ascites   . Benign hypertensive renal disease   . DM (diabetes mellitus), type 2 with renal complications ([place])   . Depression   . Bipolar 1 disorder ([place])   . Asthma   . Generalized abdominal pain 05/07/2016    Past Surgical History:  Procedure Laterality Date  . ABDOMINAL HYSTERECTOMY    . debribalator  2016  . INSERT / REPLACE / REMOVE PACEMAKER    . INSERTION OF ICD    . TONSILECTOMY, ADENOIDECTOMY, BILATERAL MYRINGOTOMY AND TUBES    .  TONSILLECTOMY    . TONSILLECTOMY    . TUBAL LIGATION  1980  . TUBAL LIGATION      Prior to Admission medications   Medication Sig Start Date End Date Taking? Authorizing Provider  atorvastatin (LIPITOR) 40 MG tablet TAKE 1 TABLET (40 MG TOTAL) BY MOUTH DAILY. 09/26/17   Minna Merritts, MD  blood glucose meter kit and supplies KIT Dispense based on patient and insurance preference. Use up to four times daily as directed. (FOR ICD-9 250.00, 250.01). 07/24/17   Johnson, Megan P, DO  carvedilol (COREG) 6.25 MG tablet TAKE 1 TABLET BY MOUTH 2 TIMES DAILY WITH A MEAL.PLEASE CALL TO SCHEDULE APPT W/ DR  Rockey Situ . 11/04/17   Minna Merritts, MD  dicyclomine (BENTYL) 20 MG tablet TAKE 1 TABLET (20 MG TOTAL) BY MOUTH 3 (THREE) TIMES DAILY AS NEEDED FOR SPASMS. 04/16/17   Johnson, Megan P, DO  furosemide (LASIX) 20 MG tablet Take 1 tablet (20 mg total) by mouth daily. 07/05/17   Vaughan Basta, MD  Insulin Glargine (LANTUS SOLOSTAR) 100 UNIT/ML Solostar Pen Inject 40 Units into the skin daily at 10 pm. 07/15/17   Park Liter P, DO  Insulin Pen Needle 32G X 6 MM MISC 1 each by Does not apply route daily. 07/15/17   Johnson, Megan P, DO  lamoTRIgine (LAMICTAL) 25 MG tablet Take 1 tablet (25 mg total) by mouth 2 (two) times daily. 10/16/17   [name], MD  neomycin-polymyxin-hydrocortisone (CORTISPORIN) OTIC solution Place 3 drops into the right ear 4 (four) times daily. 09/12/17   Johnson, Megan P, DO  nitroGLYCERIN (NITROSTAT) 0.4 MG SL tablet Place 1 tablet (0.4 mg total) under the tongue every 5 (five) minutes as needed for chest pain. 06/04/16   Gladstone Lighter, MD  ondansetron (ZOFRAN ODT) 4 MG disintegrating tablet Take 1 tablet (4 mg total) by mouth every 8 (eight) hours as needed for nausea or vomiting. 12/07/16   Earleen Newport, MD  pantoprazole (PROTONIX) 40 MG tablet Take 1 tablet (40 mg total) by mouth daily. 08/28/17 08/28/18  [name], MD  PARoxetine (PAXIL) 30 MG tablet Take 1 tablet (30 mg total) by mouth daily. 09/16/17   [name], MD  QUEtiapine (SEROQUEL) 300 MG tablet Take 1 tablet (300 mg total) by mouth at bedtime. 09/16/17   [name], MD  sacubitril-valsartan (ENTRESTO) 49-51 MG Take 1 tablet by mouth 2 (two) times daily. 04/29/17   Alisa Graff, FNP  spironolactone (ALDACTONE) 25 MG tablet TAKE 1 TABLET (25 MG TOTAL) BY MOUTH DAILY. 07/13/17   [provider]    Allergies Levothyroxine  Family History  Problem Relation Age of Onset  . Hypertension Mother   . Hyperlipidemia Mother   . Heart disease Mother   . Hypertension Sister   .  Asthma Sister   . Heart disease Sister   . Diabetes Sister   . Cancer Sister   . Alzheimer's disease Maternal Grandfather   . Hyperlipidemia Brother   . Asthma Sister   . Hypertension Sister   . Diabetes Sister     Social History Social History   Tobacco Use  . Smoking status: Former Research scientist (life sciences)  . Smokeless tobacco: Never Used  Substance Use Topics  . Alcohol use: No  . Drug use: Yes    Types: Marijuana    Comment: 1/29 smoked today    Review of Systems Constitutional: No fever/chills Eyes: No visual changes. ENT: No sore throat. Cardiovascular: Chest pain as described above  Respiratory: Denies acute shortness of breath. Gastrointestinal: No abdominal pain.  No nausea, no vomiting.  No diarrhea.  No constipation. Genitourinary: Negative for dysuria. Musculoskeletal: Negative for neck pain.  Negative for back pain. Integumentary: Negative for rash. Neurological: Negative for headaches, focal weakness or numbness.   ____________________________________________   PHYSICAL EXAM:  VITAL SIGNS: ED Triage Vitals  Enc Vitals Group     BP 11/14/17 2150 115/79     Pulse Rate 11/14/17 2150 81     Resp 11/14/17 2150 18     Temp 11/14/17 2150 97.6 F (36.4 C)     Temp Source 11/14/17 2150 Oral     SpO2 11/14/17 2150 97 %     Weight --      Height --      Head Circumference --      Peak Flow --      Pain Score 11/14/17 2156 10     Pain Loc --      Pain Edu? --      Excl. in [place]? --     Constitutional: Alert and oriented.  Appears uncomfortable but in no acute distress Eyes: Conjunctivae are normal.  Head: Atraumatic. Nose: No congestion/rhinnorhea. Mouth/Throat: Mucous membranes are moist. Neck: No stridor.  No meningeal signs.   Cardiovascular: Normal rate, regular rhythm. Good peripheral circulation. Grossly normal heart sounds.  Highly reproducible chest wall tenderness all throughout the anterior chest wall.  Defibrillator implanted on the left side of  chest. Respiratory: Normal respiratory effort.  No retractions. Lungs CTAB. Gastrointestinal: Soft and nontender. No distention.  Musculoskeletal: No lower extremity tenderness nor edema. No gross deformities of extremities. Neurologic:  Normal speech and language. No gross focal neurologic deficits are appreciated.  Skin:  Skin is warm, dry and intact. No rash noted. Psychiatric: Mood and affect are normal. Speech and behavior are normal.  ____________________________________________   LABS (all labs ordered are listed, but only abnormal results are displayed)  Labs Reviewed  CBC - Abnormal; Notable for the following components:      Result Value   RBC 3.57 (*)    Hemoglobin 11.4 (*)    HCT 32.6 (*)    All other components within normal limits  COMPREHENSIVE METABOLIC PANEL - Abnormal; Notable for the following components:   Potassium 3.1 (*)    Glucose, Bld 215 (*)    All other components within normal limits  TROPONIN I - Abnormal; Notable for the following components:   Troponin I 0.03 (*)    All other components within normal limits  TROPONIN I - Abnormal; Notable for the following components:   Troponin I 0.03 (*)    All other components within normal limits  LIPASE, BLOOD  URINALYSIS, COMPLETE (UACMP) WITH MICROSCOPIC   ____________________________________________  EKG  ED ECG REPORT I, Hinda Kehr, the attending physician, personally viewed and interpreted this ECG.  Date: 11/14/2017 EKG Time: 21: 58 Rate: 86 Rhythm: normal sinus rhythm QRS Axis: normal Intervals: normal ST/T Wave abnormalities: Non-specific ST segment / T-wave changes, but no evidence of acute ischemia.  Specifically, the patient has T wave inversions in leads I, aVL, V4, V5, and V6.  However, these changes have been present at least since 2018 at the time of her last EKG Narrative Interpretation: no definitive evidence of acute  ischemia   ____________________________________________  RADIOLOGY   ED MD interpretation: No indication of acute abnormality on chest x-ray  Official radiology report(s): Dg Chest 2 View  Result Date: 11/14/2017 CLINICAL DATA:  Chest pain radiating to left arm EXAM: CHEST - 2 VIEW COMPARISON:  Chest radiograph 07/02/2017 FINDINGS: Unchanged position of left chest wall single lead AICD. Cardiomediastinal contours are normal. No pleural effusion or pneumothorax. The lungs are clear. IMPRESSION: No active cardiopulmonary disease. Electronically Signed   By: Ulyses Jarred M.D.   On: 11/14/2017 22:22    ____________________________________________   PROCEDURES  Critical Care performed: No   Procedure(s) performed:   Procedures   ____________________________________________   INITIAL IMPRESSION / ASSESSMENT AND PLAN / ED COURSE  As part of my medical decision making, I reviewed the following data within the [facility] notes reviewed and incorporated, Labs reviewed  EKG interpreted , Old EKG reviewed, Old chart reviewed and Notes from prior ED visits    Differential diagnosis includes, but is not limited to, musculoskeletal chest wall pain, costochondritis, ACS, chronic angina, PE, pneumothorax, pneumonia.  The patient is quite well-appearing although she is complaining of muscle aches all over.  After my interview with the patient and her family member and she remembered that she had been exercise recently, she laughed and seems to feel better about the presence of the pain.  It is highly reproducible and I strongly feel the musculoskeletal strain is the cause of her symptoms.  Her initial lab work is notable only for a troponin of 0.03 which is very nonspecific particularly in a patient with chronic cardiomegaly and a defibrillator and chronic CHF.  Her vital signs were all stable and she has a Wells score for PE of 0.  Given the troponin of 0.03 I think it  is reasonable to check a second troponin to make sure it is not trending up, but I anticipate discharge with outpatient follow-up.  She agrees with the plan, particularly after remembering the exercise and after we talked about the reproducible nature of the pain.  I am giving her a small dose of Toradol to help with the pain and inflammation.   Clinical Course as of Nov 15 420  Fri Nov 15, 2017  0414 Unchanged troponin from prior.  Patient has been resting comfortably.  Continues to have chest wall pain.  I will discharge and recommend close outpatient follow-up and recommend the use of NSAIDs with meals.    I gave my usual and customary return precautions.   Troponin I(!!): 0.03 [CF]    Clinical Course User Index [CF] Hinda Kehr, MD    ____________________________________________  FINAL CLINICAL IMPRESSION(S) / ED DIAGNOSES  Final diagnoses:  Chest wall pain     MEDICATIONS GIVEN DURING THIS VISIT:  Medications  ketorolac (TORADOL) 30 MG/ML injection 15 mg (15 mg Intravenous Given 11/15/17 0128)     ED Discharge Orders    None       Note:  This document was prepared using Dragon voice recognition software and may include unintentional dictation errors.    Hinda Kehr, MD 11/15/17 [phone]

## 2017-11-21 NOTE — Progress Notes (Signed)
  THERAPIST PROGRESS NOTE   Date of Service:   10/28/2017  Session Time:    1 hour  Patient:   Bonnie Ochoa   DOB:   Sep 21, 1965  MR Number:  [id number]  Location:  [facility] [address] [place] [address] Dept: [phone]            Provider/Observer:  Marinda Elk Counselor  Risk of Suicide/Violence: virtually non-existent   Diagnosis:    Bipolar I disorder, most recent episode depressed (HCC)  Type of Therapy: Family Therapy  Treatment Goals addressed: Anxiety and Coping  Participation Level: Active    Behavioral Observation: Bonnie Ochoa  presents as a 52 y.o.-year-old  Interventions: CBT and Motivational Interviewing   Behavioral Response: CasualAlertEuthymic   Summary: Therapist assisted Patient and Patient fiance with understanding Patient's mood and symptoms.  Therapist allowed each member to discuss the relationship from their perspective.  Therapist asked each member "the miracle question."  Therapist assisted with understanding their role in obtaining "the miracle question."  Therapist instructed each member to participate in daily activities such as asking each other "about their day" to increase positive communication & to actively listen and respond.     Plan: Bonnie Ochoa will continue to communicate with her partner to reduce symptoms.   Return again in 2 weeks.

## 2017-11-22 ENCOUNTER — Other Ambulatory Visit: Payer: Self-pay | Admitting: Family

## 2017-11-22 MED ORDER — SACUBITRIL-VALSARTAN 49-51 MG PO TABS: 1.0000 | ORAL_TABLET | Freq: Two times a day (BID) | ORAL | 5 refills | Status: DC

## 2017-11-23 NOTE — Progress Notes (Deleted)
Cardiology Office Note  Date:  11/23/2017   ID:  Bonnie Ochoa, DOB 11/06/1965, MRN [id number]  PCP:  Valerie Roys, DO   No chief complaint on file.   HPI:  [name] is a pleasant 53 year old woman arrived from [place] In 2017 with notes in the computer detailing history of  Medical and appt noncompliance, Substance abuse-chronic /marijuana bipolar,   anxiety diabetes,  hypertension,  asthma,  Obstructive sleep apnea, restless leg syndrome Nonischemic cardiomyopathy,  history of ICD,  ejection fraction 25% anemia Previous alcohol problem (patient denies any coronary disease on prior cardiac catheterization)  ascites, cirrhosis, History of paracentesis Previously on pain medication at home, oxycodone She presents today for follow-up of her chronic systolic CHF  Reports on arrival having diffuse arm pain, back pain Weight home 157, She feels baseline weight 153 at home Here 166. She feels extra weight is from her shoes Denies any leg swelling, shortness of breath when climbing stairs  Hospital admission 09/10/16 numerous sx on arrival to ER chest pain, shortness of breath, abdominal pain, dysuria, urinary frequency, syncope 2 today with lightheadedness, nausea and vomiting, and diarrhea , sob TBili 2.4, had paracentesis, diuresis D/c on lasix 60 BID  Hospital admission 09/17/16: chest pain, ABD pain fevers, chills, chest pain, shortness of breath, vomiting and diarrhea. "ran out of meds" HTN, acute on chronic systolic CHF  Scheduled to see primary care Her normal caretaker/Family member/partner not with her today, different family member resents with her who does not do her medications  Other past medical history reviewed Seen in the emergency room 05/19/2016 for abdominal pain, chest pain BNP in the hospital 3700  acute on chronic CHF, Had 20 L diuresis  Since her last clinic visit she has been to the emergency room in December 2017 for nausea, vomiting, URI  symptoms  CT scan consistent with moderate abdominal ascites, fatty liver Aortic atherosclerosis Used to drink alcohol, no longer drinks   PMH:   has a past medical history of ADHD, Arthritis, Asthma, Bipolar 1 disorder ([place]), CHF (congestive heart failure) ([place]), Cirrhosis of liver ([place]), Coronary artery disease, Depression, Diabetes mellitus without complication ([place]), Diverticulitis, Hypertension, IBS (irritable bowel syndrome), Insomnia, Migraines, PTSD (post-traumatic stress disorder), Restless leg syndrome, Sleep apnea, and Vertigo.  PSH:    Past Surgical History:  Procedure Laterality Date  . ABDOMINAL HYSTERECTOMY    . debribalator  2016  . INSERT / REPLACE / REMOVE PACEMAKER    . INSERTION OF ICD    . TONSILECTOMY, ADENOIDECTOMY, BILATERAL MYRINGOTOMY AND TUBES    . TONSILLECTOMY    . TONSILLECTOMY    . TUBAL LIGATION  1980  . TUBAL LIGATION      Current Outpatient Medications  Medication Sig Dispense Refill  . atorvastatin (LIPITOR) 40 MG tablet TAKE 1 TABLET (40 MG TOTAL) BY MOUTH DAILY. 90 tablet 0  . blood glucose meter kit and supplies KIT Dispense based on patient and insurance preference. Use up to four times daily as directed. (FOR ICD-9 250.00, 250.01). 1 each 0  . carvedilol (COREG) 6.25 MG tablet TAKE 1 TABLET BY MOUTH 2 TIMES DAILY WITH A MEAL.PLEASE CALL TO SCHEDULE APPT W/ DR Rockey Situ . 180 tablet 0  . dicyclomine (BENTYL) 20 MG tablet TAKE 1 TABLET (20 MG TOTAL) BY MOUTH 3 (THREE) TIMES DAILY AS NEEDED FOR SPASMS. 90 tablet 3  . furosemide (LASIX) 20 MG tablet Take 1 tablet (20 mg total) by mouth daily. 30 tablet 0  . Insulin Glargine (LANTUS  SOLOSTAR) 100 UNIT/ML Solostar Pen Inject 40 Units into the skin daily at 10 pm. 5 pen PRN  . Insulin Pen Needle 32G X 6 MM MISC 1 each by Does not apply route daily. 100 each 12  . lamoTRIgine (LAMICTAL) 25 MG tablet Take 1 tablet (25 mg total) by mouth 2 (two) times daily. 180 tablet 1  .  neomycin-polymyxin-hydrocortisone (CORTISPORIN) OTIC solution Place 3 drops into the right ear 4 (four) times daily. 10 mL 0  . nitroGLYCERIN (NITROSTAT) 0.4 MG SL tablet Place 1 tablet (0.4 mg total) under the tongue every 5 (five) minutes as needed for chest pain. 30 tablet 12  . ondansetron (ZOFRAN ODT) 4 MG disintegrating tablet Take 1 tablet (4 mg total) by mouth every 8 (eight) hours as needed for nausea or vomiting. 20 tablet 0  . pantoprazole (PROTONIX) 40 MG tablet Take 1 tablet (40 mg total) by mouth daily. 30 tablet 11  . PARoxetine (PAXIL) 30 MG tablet Take 1 tablet (30 mg total) by mouth daily. 90 tablet 0  . QUEtiapine (SEROQUEL) 300 MG tablet Take 1 tablet (300 mg total) by mouth at bedtime. 90 tablet 0  . sacubitril-valsartan (ENTRESTO) 49-51 MG Take 1 tablet by mouth 2 (two) times daily. 60 tablet 5  . spironolactone (ALDACTONE) 25 MG tablet TAKE 1 TABLET (25 MG TOTAL) BY MOUTH DAILY.  2   No current facility-administered medications for this visit.      Allergies:   Levothyroxine   Social History:  The patient  reports that she has quit smoking. She has never used smokeless tobacco. She reports that she has current or past drug history. Drug: Marijuana. She reports that she does not drink alcohol.   Family History:   family history includes Alzheimer's disease in her maternal grandfather; Asthma in her sister and sister; Cancer in her sister; Diabetes in her sister and sister; Heart disease in her mother and sister; Hyperlipidemia in her brother and mother; Hypertension in her mother, sister, and sister.    Review of Systems: Review of Systems  Constitutional: Negative.   Respiratory: Positive for shortness of breath.   Cardiovascular: Negative.   Gastrointestinal: Negative.   Musculoskeletal: Negative.        Arm pain, back pain  Neurological: Negative.   Psychiatric/Behavioral: Negative.   All other systems reviewed and are negative.    PHYSICAL EXAM: VS:  There  were no vitals taken for this visit. , BMI There is no height or weight on file to calculate BMI. GEN: Well nourished, well developed, in no acute distress  HEENT: normal  Neck: no JVD, carotid bruits, or masses Cardiac: RRR; no murmurs, rubs, or gallops,no edema  Respiratory:  clear to auscultation bilaterally, normal work of breathing GI: soft, nontender, nondistended, + BS MS: no deformity or atrophy  Skin: warm and dry, no rash Neuro:  Strength and sensation are intact Psych: euthymic mood, full affect    Recent Labs: 07/03/2017: Magnesium 2.6 07/15/2017: TSH 2.620 11/14/2017: ALT 16; BUN 19; Creatinine, Ser 0.97; Hemoglobin 11.4; Platelets 246; Potassium 3.1; Sodium 139    Lipid Panel Lab Results  Component Value Date   CHOL 179 12/28/2016   HDL 50 12/28/2016   LDLCALC 103 (H) 12/28/2016   TRIG 132 12/28/2016      Wt Readings from Last 3 Encounters:  09/14/17 200 lb (90.7 kg)  09/12/17 203 lb 9 oz (92.3 kg)  09/10/17 209 lb 8 oz (95 kg)       ASSESSMENT  AND PLAN:  Chronic systolic heart failure (HCC) Weight up dramatically on office visit today but she reports weight is 157 at home Goal weight typically 153 pounds Unclear why it is 166 on today's visit in the office Stressed the importance of compliance with Lasix 60 mg twice a day Given she is in and out of the emergency room with CHF exacerbation, We have recommended she take metolazone 2.5 up to 5 mg sparingly for weight more than 160 pounds on her scale at home  Essential hypertension Blood pressure is low, unable to titrate entresto We will continue current medications  Pulmonary hypertension Recommended she call our office for worsening shortness of breath, weight gain not relieved with Lasix and metolazone  NICM (nonischemic cardiomyopathy) ([place]) Recommended alcohol cessation, avoid drugs of abuse We'll continue entresto, Aldactone, beta blocker  Obstructive sleep apnea  Generalized abdominal  pain  Diabetes mellitus without complication ([place]) We have encouraged continued exercise, careful diet management in an effort to lose weight.  Bipolar 1 disorder (HCC)  Other ascites  Alcoholic cirrhosis of liver with ascites ([place])  Noncompliance Stressed importance of compliance of her medications Typically will present with family member to her visits, family tends to do her medications  Disposition:   F/U  6 months  No orders of the defined types were placed in this encounter.   Total encounter time more than 25 minutes  Greater than 50% was spent in counseling and coordination of care with the patient   Signed, Esmond Plants, M.D., Ph.D. 11/23/2017  [place], [facility]

## 2017-11-27 ENCOUNTER — Ambulatory Visit: Payer: Medicare Other | Admitting: Cardiovascular Disease

## 2017-12-02 ENCOUNTER — Telehealth: Payer: Self-pay | Admitting: Gastroenterology

## 2017-12-02 NOTE — Telephone Encounter (Signed)
Left vm to set up FU apt for Cirrosis per Dr. Maximino Greenland with Dr. Servando Snare

## 2017-12-03 NOTE — Telephone Encounter (Signed)
Attempted to call pt to set up Fu per Dr. Maximino Greenland  No vm set up

## 2017-12-05 ENCOUNTER — Other Ambulatory Visit: Payer: Self-pay | Admitting: Psychiatry

## 2017-12-05 ENCOUNTER — Telehealth: Payer: Self-pay | Admitting: Gastroenterology

## 2017-12-05 ENCOUNTER — Telehealth: Payer: Self-pay

## 2017-12-05 ENCOUNTER — Ambulatory Visit (INDEPENDENT_AMBULATORY_CARE_PROVIDER_SITE_OTHER): Payer: Medicare Other | Admitting: *Deleted

## 2017-12-05 DIAGNOSIS — Z95810 Presence of automatic (implantable) cardiac defibrillator: Secondary | ICD-10-CM

## 2017-12-05 DIAGNOSIS — I5022 Chronic systolic (congestive) heart failure: Secondary | ICD-10-CM

## 2017-12-05 DIAGNOSIS — I428 Other cardiomyopathies: Secondary | ICD-10-CM

## 2017-12-05 DIAGNOSIS — F3130 Bipolar disorder, current episode depressed, mild or moderate severity, unspecified: Secondary | ICD-10-CM

## 2017-12-05 NOTE — Telephone Encounter (Signed)
left a vm to get pt schedule with Fu with Dr. Servando Snare

## 2017-12-05 NOTE — Telephone Encounter (Signed)
Error

## 2017-12-05 NOTE — Telephone Encounter (Signed)
Remote ICM transmission received.  Attempted call to patient and no answer   

## 2017-12-05 NOTE — Progress Notes (Signed)
EPIC Encounter for ICM Monitoring  Patient Name: Bonnie Ochoa is a 52 y.o. female Date: 12/05/2017 Primary Care Physican: Valerie Roys, DO Primary Cardiologist: Sanjuana Letters, NP Electrophysiologist: Faustino Congress Weight:Previous [id number]      Attempted call to patient and unable to reach.   Transmission reviewed.    Thoracic impedance trending just below normal.  Prescribed dosage: Furosemide 20 mg 1 tablet (20 mg total) daily.   Labs: 11/14/2017 Creatinine 0.97, BUN 19, Potassium 3.1, Sodium 139, EGFR >60 08/22/2017 Creatinine 1.11, BUN 16, Potassium 4.5, Sodium 142, EGFR 57-66  08/01/2017 Creatinine 1.03, BUN 17, Potassium 4.2, Sodium 141, EGFR 62-72  07/15/2017 Creatinine 1.24, BUN 15, Potassium 3.8, Sodium 141, EGFR 50-57  07/05/2017 Creatinine 2.04, BUN 55, Potassium 3.5, Sodium 134, EGFR 27-31  07/04/2017 Creatinine 3.63, BUN 100, Potassium 3.5, Sodium 132, EGFR 13-15  07/03/2017 Creatinine 1.74, BUN 105, Potassium 3.6, Sodium 129, EGFR 10-11  07/02/2017 Creatinine 5.45, BUN 114, Potassium 4.3, Sodium 120, EGFR 8-9  02/20/2017 Creatinine 1.91, BUN 61, Potassium 4.5, Sodium 134, EGFR 29-34 02/01/2017 Creatinine 1.47, BUN 25, Potassium 4.6, Sodium 138, EGFR 40-47 12/28/2016 Creatinine 1.28, BUN 34, Potassium 5.2, Sodium 139, EGFR 48-55 12/08/2016 Creatinine 1.15, BUN 30, Potassium 3.9, Sodium 138, EGFR 54->60 12/07/2016 Creatinine 1.14, BUN 32, Potassium 4.2, Sodium 135, EGFR 52->60 12/04/2016 Creatinine 1.18, BUN 30, Potassium 3.9, Sodium 137, EGFR 54->60 11/05/2016 Creatinine 0.81, BUN 22, Potassium 3.9, Sodium 136, EGFR >60 09/18/2016 Creatinine 1.34, BUN 28, Potassium 3.3, Sodium 138, EGFR 45-52  09/17/2016 Creatinine 1.20, BUN 24, Potassium 3.4, Sodium 138, EGFR 51-59  09/11/2016 Creatinine 1.14, BUN 22, Potassium 3.3, Sodium 138, EGFR 54->60  01/28/2018Creatinine 1.13, BUN 24, Potassium 3.6, Sodium 136, EGFR 55->60  Recommendations: NONE - Unable to  reach.  Follow-up plan: ICM clinic phone appointment on 01/07/2018.  Office appointment scheduled 12/09/2017 with Dr. Rockey Situ.  Copy of ICM check sent to Dr. Rockey Situ and Dr. Caryl Comes.   3 month ICM trend: 12/05/2017    1 Year ICM trend:       Rosalene Billings, RN 12/05/2017 11:51 AM

## 2017-12-06 ENCOUNTER — Encounter: Payer: Self-pay | Admitting: Cardiology

## 2017-12-06 NOTE — Progress Notes (Signed)
Remote ICD transmission.   

## 2017-12-07 NOTE — Progress Notes (Signed)
Cardiology Office Note  Date:  12/09/2017   ID:  Bonnie Ochoa, DOB Jul 24, 1966, MRN [id number]  PCP:  Valerie Roys, DO   Chief Complaint  Patient presents with  . other    Follow up from [facility] ER; chest pain. Meds reviewed by the pt. verbally. Pt. c/o chest pain off & on with some shortness of breath.     HPI:  Bonnie Ochoa is a pleasant 52 year old woman arrived from [place] In 2017 with notes in the computer detailing history of  Medical and appt noncompliance, Substance abuse-chronic /marijuana bipolar,   anxiety diabetes,  hypertension,  asthma,  Obstructive sleep apnea, restless leg syndrome Nonischemic cardiomyopathy,  history of ICD,  ejection fraction 25% anemia Previous alcohol problem (patient denies any coronary disease on prior cardiac catheterization)  ascites, cirrhosis, History of paracentesis Previously on pain medication at home, oxycodone She presents today for follow-up of her chronic systolic CHF  Was in the ER, November 15, 2017 for chest pain Reports that she started a new workout plan may be did too much too quickly was using upper body weights and doing aerobic Developed muscle cramps, Potassium 3.1 Reports that potassium was previously held, not restarted  Previous hospitalization November 2018 for hyperglycemia nonketotic coma She had acute renal failure and hyponatremia  In follow-up today reports that she feels well Is taking Lasix 10 mg daily not 20 mg Perhaps has some fluid retention, not severe  Weight is up 9 pounds this year  EKG personally reviewed by myself on todays visit Shows normal sinus rhythm rate 93 bpm nonspecific ST abnormality T wave abnormality 1 and aVL  Other past medical history reviewed Hospital admission 09/10/16 numerous sx on arrival to ER chest pain, shortness of breath, abdominal pain, dysuria, urinary frequency, syncope 2 today with lightheadedness, nausea and vomiting, and diarrhea , sob TBili 2.4, had paracentesis,  diuresis D/c on lasix 60 BID  Hospital admission 09/17/16: chest pain, ABD pain fevers, chills, chest pain, shortness of breath, vomiting and diarrhea. "ran out of meds" HTN, acute on chronic systolic CHF  Seen in the emergency room 05/19/2016 for abdominal pain, chest pain BNP in the hospital 3700  acute on chronic CHF, Had 20 L diuresis   emergency room in December 2017 for nausea, vomiting, URI symptoms  CT scan consistent with moderate abdominal ascites, fatty liver Aortic atherosclerosis Used to drink alcohol, no longer drinks   PMH:   has a past medical history of ADHD, Arthritis, Asthma, Bipolar 1 disorder ([place]), CHF (congestive heart failure) ([place]), Cirrhosis of liver ([place]), Coronary artery disease, Depression, Diabetes mellitus without complication ([place]), Diverticulitis, Hypertension, IBS (irritable bowel syndrome), Insomnia, Migraines, PTSD (post-traumatic stress disorder), Restless leg syndrome, Sleep apnea, and Vertigo.  PSH:    Past Surgical History:  Procedure Laterality Date  . ABDOMINAL HYSTERECTOMY    . debribalator  2016  . INSERT / REPLACE / REMOVE PACEMAKER    . INSERTION OF ICD    . TONSILECTOMY, ADENOIDECTOMY, BILATERAL MYRINGOTOMY AND TUBES    . TONSILLECTOMY    . TONSILLECTOMY    . TUBAL LIGATION  1980  . TUBAL LIGATION      Current Outpatient Medications  Medication Sig Dispense Refill  . atorvastatin (LIPITOR) 40 MG tablet TAKE 1 TABLET (40 MG TOTAL) BY MOUTH DAILY. 90 tablet 0  . blood glucose meter kit and supplies KIT Dispense based on patient and insurance preference. Use up to four times daily as directed. (FOR ICD-9 250.00, 250.01). 1 each  0  . carvedilol (COREG) 6.25 MG tablet TAKE 1 TABLET BY MOUTH 2 TIMES DAILY WITH A MEAL.PLEASE CALL TO SCHEDULE APPT W/ DR Rockey Situ . 180 tablet 0  . dicyclomine (BENTYL) 20 MG tablet TAKE 1 TABLET (20 MG TOTAL) BY MOUTH 3 (THREE) TIMES DAILY AS NEEDED FOR SPASMS. 90 tablet 3  . furosemide (LASIX) 20 MG tablet  Take 1 tablet (20 mg total) by mouth daily. 30 tablet 0  . Insulin Glargine (LANTUS SOLOSTAR) 100 UNIT/ML Solostar Pen Inject 40 Units into the skin daily at 10 pm. 5 pen PRN  . Insulin Pen Needle 32G X 6 MM MISC 1 each by Does not apply route daily. 100 each 12  . lamoTRIgine (LAMICTAL) 25 MG tablet Take 1 tablet (25 mg total) by mouth 2 (two) times daily. 180 tablet 1  . neomycin-polymyxin-hydrocortisone (CORTISPORIN) OTIC solution Place 3 drops into the right ear 4 (four) times daily. 10 mL 0  . nitroGLYCERIN (NITROSTAT) 0.4 MG SL tablet Place 1 tablet (0.4 mg total) under the tongue every 5 (five) minutes as needed for chest pain. 30 tablet 12  . pantoprazole (PROTONIX) 40 MG tablet Take 1 tablet (40 mg total) by mouth daily. 30 tablet 11  . PARoxetine (PAXIL) 30 MG tablet Take 1 tablet (30 mg total) by mouth daily. 90 tablet 0  . QUEtiapine (SEROQUEL) 300 MG tablet Take 1 tablet (300 mg total) by mouth at bedtime. 90 tablet 0  . sacubitril-valsartan (ENTRESTO) 49-51 MG Take 1 tablet by mouth 2 (two) times daily. 60 tablet 5  . spironolactone (ALDACTONE) 25 MG tablet TAKE 1 TABLET (25 MG TOTAL) BY MOUTH DAILY.  2   No current facility-administered medications for this visit.      Allergies:   Levothyroxine   Social History:  The patient  reports that she has quit smoking. She has never used smokeless tobacco. She reports that she has current or past drug history. Drug: Marijuana. She reports that she does not drink alcohol.   Family History:   family history includes Alzheimer's disease in her maternal grandfather; Asthma in her sister and sister; Cancer in her sister; Diabetes in her sister and sister; Heart disease in her mother and sister; Hyperlipidemia in her brother and mother; Hypertension in her mother, sister, and sister.    Review of Systems: Review of Systems  Constitutional: Negative.        Weight gain  Respiratory: Negative.   Cardiovascular: Negative.    Gastrointestinal: Negative.   Musculoskeletal: Negative.        Arm pain, back pain  Neurological: Negative.   Psychiatric/Behavioral: Negative.   All other systems reviewed and are negative.    PHYSICAL EXAM: VS:  BP 100/80 (BP Location: Left Arm, Patient Position: Sitting, Cuff Size: Normal)   Pulse 93   Ht 5' 5"  (1.651 m)   Wt 210 lb 8 oz (95.5 kg)   BMI 35.03 kg/m  , BMI Body mass index is 35.03 kg/m. Constitutional:  oriented to person, place, and time. No distress. obese HENT:  Head: Normocephalic and atraumatic.  Eyes:  no discharge. No scleral icterus.  Neck: Normal range of motion. Neck supple. No JVD present.  Cardiovascular: Normal rate, regular rhythm, normal heart sounds and intact distal pulses. Exam reveals no gallop and no friction rub. No edema No murmur heard. Pulmonary/Chest: Effort normal and breath sounds normal. No stridor. No respiratory distress.  no wheezes.  no rales.  no tenderness.  Abdominal: Soft.  no distension.  no tenderness.  Musculoskeletal: Normal range of motion.  no  tenderness or deformity.  Neurological:  normal muscle tone. Coordination normal. No atrophy Skin: Skin is warm and dry. No rash noted. not diaphoretic.  Psychiatric:  normal mood and affect. behavior is normal. Thought content normal.    Recent Labs: 07/03/2017: Magnesium 2.6 07/15/2017: TSH 2.620 11/14/2017: ALT 16; BUN 19; Creatinine, Ser 0.97; Hemoglobin 11.4; Platelets 246; Potassium 3.1; Sodium 139    Lipid Panel Lab Results  Component Value Date   CHOL 179 12/28/2016   HDL 50 12/28/2016   LDLCALC 103 (H) 12/28/2016   TRIG 132 12/28/2016      Wt Readings from Last 3 Encounters:  12/09/17 210 lb 8 oz (95.5 kg)  09/14/17 200 lb (90.7 kg)  09/12/17 203 lb 9 oz (92.3 kg)       ASSESSMENT AND PLAN:  Chronic systolic heart failure (HCC) Weight up 7 pounds in the past 3 to 4 months We will increase Lasix back to 20 mg daily Restart potassium 20 mEq  daily  Essential hypertension Blood pressure low, asymptomatic Continue current medications  Pulmonary hypertension Denies shortness of breath despite weight gain Unable to estimate right heart pressures on last echocardiogram November 2018  NICM (nonischemic cardiomyopathy) ([place])  continue entresto, Aldactone, beta blocker, lasix Ejection fraction 20 to 25% November 2018  Obstructive sleep apnea  Diabetes mellitus without complication ([place]) Long discussion concerning her diet " Eats lots of potatoes"  Bipolar 1 disorder ([place]) Stable  Other ascites Benign abdomen on today's visit  Alcoholic cirrhosis of liver with ascites ([place]) Alcohol cessation recommended   Total encounter time more than 25 minutes  Greater than 50% was spent in counseling and coordination of care with the patient   Disposition:   F/U  6 months   Orders Placed This Encounter  Procedures  . EKG 12-Lead    Total encounter time more than 25 minutes  Greater than 50% was spent in counseling and coordination of care with the patient   Signed, Esmond Plants, M.D., Ph.D. 12/09/2017  [place], [place]

## 2017-12-08 NOTE — Progress Notes (Deleted)
Patient ID: Bonnie Ochoa, female    DOB: 1966/05/15, 52 y.o.   MRN: [id number]  HPI  Ms Mild is a 52 y/o female with a history of obstructive sleep apnea, restless leg syndrome, PTSD, HTN, DM, depression, CAD, cirrhosis, bipolar, asthma, marijuana use and chronic heart failure.   Echo report from 07/03/17 reviewed and showed an EF of 20-25%. Echo was done 05/28/16 and showed an EF of 20-25% along with moderate MR/TR and severely elevated PA pressure of 64 mm Hg.    Was in the ED 11/15/16 due to chest pain where she was evaluated and released. Was in the ED 09/14/17 due to right ear pain where she was treated and released.  Admitted 07/02/17 due to hyperglycemia along with acute on chronic renal failure. Cardiology and nephrology consults were obtained. Initially needed an insulin drip and then transitioned to lantus/novolog. Discharged after 3 days.   She presents today for her follow-up visit with a chief complaint of   Past Medical History:  Diagnosis Date  . ADHD   . Arthritis   . Asthma   . Bipolar 1 disorder (HCC)   . CHF (congestive heart failure) (HCC)   . Cirrhosis of liver (HCC)   . Coronary artery disease   . Depression   . Diabetes mellitus without complication (HCC)   . Diverticulitis   . Hypertension   . IBS (irritable bowel syndrome)   . Insomnia   . Migraines   . PTSD (post-traumatic stress disorder)   . Restless leg syndrome   . Sleep apnea   . Vertigo    Past Surgical History:  Procedure Laterality Date  . ABDOMINAL HYSTERECTOMY    . debribalator  2016  . INSERT / REPLACE / REMOVE PACEMAKER    . INSERTION OF ICD    . TONSILECTOMY, ADENOIDECTOMY, BILATERAL MYRINGOTOMY AND TUBES    . TONSILLECTOMY    . TONSILLECTOMY    . TUBAL LIGATION  1980  . TUBAL LIGATION     Family History  Problem Relation Age of Onset  . Hypertension Mother   . Hyperlipidemia Mother   . Heart disease Mother   . Hypertension Sister   . Asthma Sister   . Heart disease Sister   .  Diabetes Sister   . Cancer Sister   . Alzheimer's disease Maternal Grandfather   . Hyperlipidemia Brother   . Asthma Sister   . Hypertension Sister   . Diabetes Sister    Social History   Tobacco Use  . Smoking status: Former Games developer  . Smokeless tobacco: Never Used  Substance Use Topics  . Alcohol use: No   Allergies  Allergen Reactions  . Levothyroxine Rash     Review of Systems  Constitutional: Positive for fatigue. Negative for appetite change.  HENT: Positive for ear pain (right). Negative for congestion, nosebleeds and sore throat.   Eyes: Negative.   Respiratory: Positive for chest tightness. Negative for shortness of breath.   Cardiovascular: Positive for chest pain (yesterday). Negative for palpitations and leg swelling.  Gastrointestinal: Negative for abdominal distention and abdominal pain.  Endocrine: Negative.   Genitourinary: Negative.   Musculoskeletal: Negative for arthralgias and neck pain.  Skin: Negative.   Allergic/Immunologic: Negative.   Neurological: Negative for dizziness and light-headedness.  Hematological: Negative for adenopathy. Does not bruise/bleed easily.  Psychiatric/Behavioral: Positive for dysphoric mood. Negative for sleep disturbance (sleeping on 2 pillows) and suicidal ideas. The patient is nervous/anxious.      Physical Exam  Constitutional: She  is oriented to person, place, and time. She appears well-developed and well-nourished.  HENT:  Head: Normocephalic and atraumatic.  Neck: Normal range of motion. Neck supple. No JVD present.  Cardiovascular: Normal rate and regular rhythm.  Pulmonary/Chest: Effort normal. She has no wheezes. She has no rales.  Abdominal: Soft. She exhibits no distension. There is no tenderness.  Musculoskeletal: She exhibits no edema or tenderness.  Neurological: She is alert and oriented to person, place, and time.  Skin: Skin is warm and dry.  Psychiatric: She has a normal mood and affect. Her behavior  is normal. Thought content normal.  Nursing note and vitals reviewed.   Assessment & Plan:  1: Chronic heart failure with reduced ejection fraction- - NYHA class II - euvolemic - not weighing daily as she says that her scale is broke; she says that she has the means to get a new scale. Instructed her to get the new scale soon and reminded her to call for an overnight weight gain of >2 pounds or a weekly weight gain of >5 pounds.  - weight up 6 pounds since 08/22/17 - not adding salt to her food and is trying to eat low sodium foods. Continues to read food labels so that she can keep her sodium intake to 2000mg  daily. - Has been trying to drink around 5-6 cups (8oz) of water a day. Discussed the importance of maintaining her fluid intake between 40-48 ounces of fluid daily.  - saw cardiologist Mariah Milling) 11/19/16 - saw EP Graciela Husbands) 08/01/17 - BNP 09/17/16 was >4500.0 - PharmD reconciled medications with the patient  2: HTN- - BP looks good today - saw PCP Laural Benes) 09/12/17 - BMP from 11/14/17 reviewed and showed sodium 139, potassium 3.1 and GFR >60  3: Diabetes- - nonfasting glucose in clinic was 162 (drank a sip of crystal light) - saw nephrologist 04/10/17 - A1c on 07/03/17 was >15.5%  4: Marijuana use- - last used marijuana today - complete cessation discussed for 3 minutes with her  Patient did not bring her medications nor a list. Each medication was verbally reviewed with the patient and she was encouraged to bring the bottles to every visit to confirm accuracy of list.

## 2017-12-09 ENCOUNTER — Ambulatory Visit (INDEPENDENT_AMBULATORY_CARE_PROVIDER_SITE_OTHER): Payer: Medicare Other | Admitting: Cardiovascular Disease

## 2017-12-09 ENCOUNTER — Encounter: Payer: Self-pay | Admitting: Cardiovascular Disease

## 2017-12-09 VITALS — BP 100/80 | HR 93 | Ht 65.0 in | Wt 210.5 lb

## 2017-12-09 DIAGNOSIS — I10 Essential (primary) hypertension: Secondary | ICD-10-CM | POA: Diagnosis not present

## 2017-12-09 DIAGNOSIS — Z794 Long term (current) use of insulin: Secondary | ICD-10-CM | POA: Diagnosis not present

## 2017-12-09 DIAGNOSIS — I428 Other cardiomyopathies: Secondary | ICD-10-CM | POA: Diagnosis not present

## 2017-12-09 DIAGNOSIS — Z95810 Presence of automatic (implantable) cardiac defibrillator: Secondary | ICD-10-CM

## 2017-12-09 DIAGNOSIS — I5022 Chronic systolic (congestive) heart failure: Secondary | ICD-10-CM

## 2017-12-09 DIAGNOSIS — E1122 Type 2 diabetes mellitus with diabetic chronic kidney disease: Secondary | ICD-10-CM | POA: Diagnosis not present

## 2017-12-09 DIAGNOSIS — N183 Chronic kidney disease, stage 3 (moderate): Secondary | ICD-10-CM | POA: Diagnosis not present

## 2017-12-09 DIAGNOSIS — I272 Pulmonary hypertension, unspecified: Secondary | ICD-10-CM | POA: Diagnosis not present

## 2017-12-09 DIAGNOSIS — K7031 Alcoholic cirrhosis of liver with ascites: Secondary | ICD-10-CM | POA: Diagnosis not present

## 2017-12-09 MED ORDER — POTASSIUM CHLORIDE CRYS ER 20 MEQ PO TBCR: 20.0000 meq | EXTENDED_RELEASE_TABLET | ORAL | 3 refills | Status: DC

## 2017-12-09 NOTE — Patient Instructions (Addendum)
Medication Instructions:   Take 2 potassium for 3 days, then down to one a day  Please increase the lasix back to 20 mg daily Skip lasix once a week Take with potassium 20 daily (none on skip day)  Labwork:  No new labs needed  Testing/Procedures:  No further testing at this time   Follow-Up: It was a pleasure seeing you in the office today. Please call us if you have new issues that need to be addressed before your next appt.  [phone]  Your physician wants you to follow-up in: 6 months.  You will receive a reminder letter in the mail two months in advance. If you don't receive a letter, please call our office to schedule the follow-up appointment.  If you need a refill on your cardiac medications before your next appointment, please call your pharmacy.  For educational health videos Log in to : [email or web address] Or : [email or web address], password : [email or web address]

## 2017-12-10 ENCOUNTER — Ambulatory Visit: Payer: Medicare Other | Admitting: Family

## 2017-12-13 ENCOUNTER — Encounter: Payer: Self-pay | Admitting: Cardiovascular Disease

## 2017-12-18 ENCOUNTER — Encounter: Payer: Self-pay | Admitting: Cardiovascular Disease

## 2017-12-21 NOTE — Progress Notes (Signed)
Patient ID: Bonnie Ochoa, female    DOB: June 27, 1966, 52 y.o.   MRN: [id number]  HPI  Bonnie Ochoa is a 52 y/o female with a history of obstructive sleep apnea, restless leg syndrome, PTSD, HTN, DM, depression, CAD, cirrhosis, bipolar, asthma, marijuana use and chronic heart failure.   Echo report from 07/03/17 reviewed and showed an EF of 20-25%. Echo was done 05/28/16 and showed an EF of 20-25% along with moderate MR/TR and severely elevated PA pressure of 64 mm Hg.    Was in the ED 11/15/17 due to chest pain where she was evaluated and released. Was in the ED 09/14/17 due to right ear pain where she was treated and released.  Admitted 07/02/17 due to hyperglycemia along with acute on chronic renal failure. Cardiology and nephrology consults were obtained. Initially needed an insulin drip and then transitioned to lantus/novolog. Discharged after 3 days.   She presents today for her follow-up visit with a chief complaint of minimal fatigue upon moderate exertion. She describes this as chronic in nature having been present for several years. She has no associated symptoms. She denies any difficulty sleeping, abdominal distention, palpitations, edema, chest pain, shortness of breath, dizziness or weight gain. Has just recently started taking potassium supplements today due to issues at the pharmacy.    Past Medical History:  Diagnosis Date  . ADHD   . Arthritis   . Asthma   . Bipolar 1 disorder ([name])   . CHF (congestive heart failure) ([name])   . Cirrhosis of liver ([name])   . Coronary artery disease   . Depression   . Diabetes mellitus without complication ([name])   . Diverticulitis   . Hypertension   . IBS (irritable bowel syndrome)   . Insomnia   . Migraines   . PTSD (post-traumatic stress disorder)   . Restless leg syndrome   . Sleep apnea   . Vertigo    Past Surgical History:  Procedure Laterality Date  . ABDOMINAL HYSTERECTOMY    . debribalator  2016  . INSERT / REPLACE / REMOVE PACEMAKER     . INSERTION OF ICD    . TONSILECTOMY, ADENOIDECTOMY, BILATERAL MYRINGOTOMY AND TUBES    . TONSILLECTOMY    . TONSILLECTOMY    . TUBAL LIGATION  1980  . TUBAL LIGATION     Family History  Problem Relation Age of Onset  . Hypertension Mother   . Hyperlipidemia Mother   . Heart disease Mother   . Hypertension Sister   . Asthma Sister   . Heart disease Sister   . Diabetes Sister   . Cancer Sister   . Alzheimer's disease Maternal Grandfather   . Hyperlipidemia Brother   . Asthma Sister   . Hypertension Sister   . Diabetes Sister    Social History   Tobacco Use  . Smoking status: Former Research scientist (life sciences)  . Smokeless tobacco: Never Used  Substance Use Topics  . Alcohol use: No   Allergies  Allergen Reactions  . Levothyroxine Rash   Prior to Admission medications   Medication Sig Start Date End Date Taking? Authorizing Provider  atorvastatin (LIPITOR) 40 MG tablet TAKE 1 TABLET BY MOUTH EVERY DAY--NEEDS APPT BEFORE RUN OUT 12/24/17  Yes Gollan, Kathlene November, MD  blood glucose meter kit and supplies KIT Dispense based on patient and insurance preference. Use up to four times daily as directed. (FOR ICD-9 250.00, 250.01). 07/24/17  Yes Johnson, Megan P, DO  carvedilol (COREG) 6.25 MG tablet TAKE 1 TABLET  BY MOUTH 2 TIMES DAILY WITH A MEAL.PLEASE CALL TO SCHEDULE APPT W/ DR Rockey Situ . 11/04/17  Yes Gollan, Kathlene November, MD  dicyclomine (BENTYL) 20 MG tablet TAKE 1 TABLET (20 MG TOTAL) BY MOUTH 3 (THREE) TIMES DAILY AS NEEDED FOR SPASMS. 04/16/17  Yes Johnson, Megan P, DO  furosemide (LASIX) 20 MG tablet Take 1 tablet (20 mg total) by mouth daily. 12/23/17  Yes Gollan, Kathlene November, MD  Insulin Glargine (LANTUS SOLOSTAR) 100 UNIT/ML Solostar Pen Inject 40 Units into the skin daily at 10 pm. 07/15/17  Yes Johnson, Megan P, DO  Insulin Pen Needle 32G X 6 MM MISC 1 each by Does not apply route daily. 07/15/17  Yes Johnson, Megan P, DO  lamoTRIgine (LAMICTAL) 25 MG tablet Take 1 tablet (25 mg total) by mouth 2  (two) times daily. 10/16/17  Yes [name], MD  neomycin-polymyxin-hydrocortisone (CORTISPORIN) OTIC solution Place 3 drops into the right ear 4 (four) times daily. 09/12/17  Yes Johnson, Megan P, DO  nitroGLYCERIN (NITROSTAT) 0.4 MG SL tablet Place 1 tablet (0.4 mg total) under the tongue every 5 (five) minutes as needed for chest pain. 06/04/16  Yes Gladstone Lighter, MD  pantoprazole (PROTONIX) 40 MG tablet Take 1 tablet (40 mg total) by mouth daily. 08/28/17 08/28/18 Yes [name], MD  PARoxetine (PAXIL) 30 MG tablet Take 1 tablet (30 mg total) by mouth daily. 09/16/17  Yes [name], MD  potassium chloride SA (K-DUR,KLOR-CON) 20 MEQ tablet Take 1 tablet (20 mEq total) by mouth as directed. Take with fluid pill and as directed 12/09/17  Yes Gollan, Kathlene November, MD  QUEtiapine (SEROQUEL) 300 MG tablet Take 1 tablet (300 mg total) by mouth at bedtime. 09/16/17  Yes Eappen, Ria Clock, MD  sacubitril-valsartan (ENTRESTO) 49-51 MG Take 1 tablet by mouth 2 (two) times daily. 11/22/17  Yes Jkwon Treptow, Otila Kluver A, FNP  spironolactone (ALDACTONE) 25 MG tablet TAKE 1 TABLET (25 MG TOTAL) BY MOUTH DAILY. 07/13/17  Yes [provider]    Review of Systems  Constitutional: Positive for fatigue ("better"). Negative for appetite change.  HENT: Negative for congestion, ear pain, nosebleeds and sore throat.   Eyes: Negative.   Respiratory: Negative for chest tightness and shortness of breath.   Cardiovascular: Negative for chest pain, palpitations and leg swelling.  Gastrointestinal: Negative for abdominal distention and abdominal pain.  Endocrine: Negative.   Genitourinary: Negative.   Musculoskeletal: Negative for arthralgias and neck pain.  Skin: Negative.   Allergic/Immunologic: Negative.   Neurological: Negative for dizziness and light-headedness.  Hematological: Negative for adenopathy. Does not bruise/bleed easily.  Psychiatric/Behavioral: Negative for dysphoric mood, sleep disturbance (sleeping  on 2 pillows) and suicidal ideas. The patient is not nervous/anxious.    Vitals:   12/24/17 1234  BP: 102/79  Pulse: (!) 103  Resp: 18  SpO2: 100%  Weight: 210 lb 6 oz (95.4 kg)  Height: 5' 5"  (1.651 m)   Wt Readings from Last 3 Encounters:  12/24/17 210 lb 6 oz (95.4 kg)  12/09/17 210 lb 8 oz (95.5 kg)  09/14/17 200 lb (90.7 kg)   Lab Results  Component Value Date   CREATININE 0.97 11/14/2017   CREATININE 1.11 (H) 08/22/2017   CREATININE 1.03 (H) 08/01/2017   Physical Exam  Constitutional: She is oriented to person, place, and time. She appears well-developed and well-nourished.  HENT:  Head: Normocephalic and atraumatic.  Neck: Normal range of motion. Neck supple. No JVD present.  Cardiovascular: Regular rhythm. Tachycardia present.  Pulmonary/Chest: Effort  normal. She has no wheezes. She has no rales.  Abdominal: Soft. She exhibits no distension. There is no tenderness.  Musculoskeletal: She exhibits no edema or tenderness.  Neurological: She is alert and oriented to person, place, and time.  Skin: Skin is warm and dry.  Psychiatric: She has a normal mood and affect. Her behavior is normal. Thought content normal.  Nursing note and vitals reviewed.  Assessment & Plan:  1: Chronic heart failure with reduced ejection fraction- - NYHA class II - euvolemic - weighing daily at home when she thinks about it. Encouraged to weigh daily and call for an overnight weight gain of >2 pounds or a weekly weight gain of >5 pounds - weight stable from last time she was here 09/10/17 - not adding salt to her food and is trying to eat low sodium foods. Continues to read food labels so that she can keep her sodium intake to <2080m daily. - walking daily about a mile - Has been trying to drink around 5-6 cups (8oz) of water a day. Discussed the importance of maintaining her fluid intake between 40-48 ounces of fluid daily.  - increase carvedilol at her next visit especially if she remains  tachycardic - saw cardiologist (Rockey Situ 12/09/17 - saw EP (Caryl Comes 08/01/17 - BNP 09/17/16 was >4500.0  2: HTN- - BP looks good today although doubtful we can titrate up entresto - saw PCP (Wynetta Emery 09/12/17 - BMP from 11/14/17 reviewed and showed sodium 139, potassium 3.1 and GFR >60  3: Diabetes- - home glucose readings have been running in the 300's; encouraged her to call her PCP regarding this - saw nephrologist 04/10/17 - A1c on 07/03/17 was >15.5%  4: Hypokalemia- - patient just started taking her potassium supplements today due to "issue with pharmacy" - she is unsure if she has an appointment to get her labs rechecked and will check with her sister, WAbigail Butts who handles all of this; explained that I could order the labs if needed in about 3 weeks since she just started the potassium today  Patient did not bring her medications nor a list. Each medication was verbally reviewed with the patient and she was encouraged to bring the bottles to every visit to confirm accuracy of list.  Return in 3 months or sooner for any questions/problems before then.

## 2017-12-22 ENCOUNTER — Other Ambulatory Visit: Payer: Self-pay | Admitting: Cardiovascular Disease

## 2017-12-23 ENCOUNTER — Ambulatory Visit: Payer: Medicare Other | Admitting: Licensed Clinical Social Worker

## 2017-12-23 ENCOUNTER — Other Ambulatory Visit: Payer: Self-pay | Admitting: *Deleted

## 2017-12-23 MED ORDER — FUROSEMIDE 20 MG PO TABS: 20.0000 mg | ORAL_TABLET | Freq: Every day | ORAL | 5 refills | Status: DC

## 2017-12-24 ENCOUNTER — Encounter: Payer: Self-pay | Admitting: Family

## 2017-12-24 ENCOUNTER — Ambulatory Visit: Payer: Medicare Other | Attending: Family | Admitting: Family

## 2017-12-24 ENCOUNTER — Other Ambulatory Visit: Payer: Self-pay | Admitting: Cardiovascular Disease

## 2017-12-24 VITALS — BP 102/79 | HR 103 | Resp 18 | Ht 65.0 in | Wt 210.4 lb

## 2017-12-24 DIAGNOSIS — E876 Hypokalemia: Secondary | ICD-10-CM | POA: Diagnosis not present

## 2017-12-24 DIAGNOSIS — Z794 Long term (current) use of insulin: Secondary | ICD-10-CM | POA: Diagnosis not present

## 2017-12-24 DIAGNOSIS — Z90710 Acquired absence of both cervix and uterus: Secondary | ICD-10-CM | POA: Insufficient documentation

## 2017-12-24 DIAGNOSIS — F4310 Post-traumatic stress disorder, unspecified: Secondary | ICD-10-CM | POA: Diagnosis not present

## 2017-12-24 DIAGNOSIS — F909 Attention-deficit hyperactivity disorder, unspecified type: Secondary | ICD-10-CM | POA: Insufficient documentation

## 2017-12-24 DIAGNOSIS — Z9889 Other specified postprocedural states: Secondary | ICD-10-CM | POA: Insufficient documentation

## 2017-12-24 DIAGNOSIS — I509 Heart failure, unspecified: Secondary | ICD-10-CM | POA: Diagnosis not present

## 2017-12-24 DIAGNOSIS — Z79899 Other long term (current) drug therapy: Secondary | ICD-10-CM | POA: Insufficient documentation

## 2017-12-24 DIAGNOSIS — Z87891 Personal history of nicotine dependence: Secondary | ICD-10-CM | POA: Insufficient documentation

## 2017-12-24 DIAGNOSIS — I5022 Chronic systolic (congestive) heart failure: Secondary | ICD-10-CM

## 2017-12-24 DIAGNOSIS — F319 Bipolar disorder, unspecified: Secondary | ICD-10-CM | POA: Insufficient documentation

## 2017-12-24 DIAGNOSIS — E1165 Type 2 diabetes mellitus with hyperglycemia: Secondary | ICD-10-CM | POA: Insufficient documentation

## 2017-12-24 DIAGNOSIS — E1122 Type 2 diabetes mellitus with diabetic chronic kidney disease: Secondary | ICD-10-CM

## 2017-12-24 DIAGNOSIS — I130 Hypertensive heart and chronic kidney disease with heart failure and stage 1 through stage 4 chronic kidney disease, or unspecified chronic kidney disease: Secondary | ICD-10-CM | POA: Diagnosis not present

## 2017-12-24 DIAGNOSIS — N183 Chronic kidney disease, stage 3 (moderate): Secondary | ICD-10-CM

## 2017-12-24 DIAGNOSIS — I10 Essential (primary) hypertension: Secondary | ICD-10-CM

## 2017-12-24 NOTE — Patient Instructions (Addendum)
Continue weighing daily and call for an overnight weight gain of > 2 pounds or a weekly weight gain of >5 pounds.  If you don't have an appointment to get your lab work checked in about 3 weeks, call us back so we can schedule that appointment.

## 2017-12-27 ENCOUNTER — Ambulatory Visit: Payer: Medicare Other

## 2017-12-27 LAB — CUP PACEART REMOTE DEVICE CHECK
Battery Remaining Longevity: 83 mo
Battery Remaining Percentage: 75 %
Date Time Interrogation Session: 20190425060030
HIGH POWER IMPEDANCE MEASURED VALUE: 77 Ohm
HighPow Impedance: 77 Ohm
Implantable Pulse Generator Implant Date: 20160410
Lead Channel Impedance Value: 460 Ohm
Lead Channel Sensing Intrinsic Amplitude: 12 mV
Lead Channel Setting Pacing Amplitude: 2.5 V
Lead Channel Setting Pacing Pulse Width: 0.5 ms
MDC IDC LEAD IMPLANT DT: 20160410
MDC IDC LEAD LOCATION: 753860
MDC IDC MSMT BATTERY VOLTAGE: 3.02 V
MDC IDC MSMT LEADCHNL RV PACING THRESHOLD AMPLITUDE: 1 V
MDC IDC MSMT LEADCHNL RV PACING THRESHOLD PULSEWIDTH: 0.5 ms
MDC IDC SET LEADCHNL RV SENSING SENSITIVITY: 0.5 mV
MDC IDC STAT BRADY RV PERCENT PACED: 1 %
Pulse Gen Serial Number: 7259964

## 2017-12-31 ENCOUNTER — Other Ambulatory Visit: Payer: Self-pay | Admitting: Family Medicine

## 2018-01-01 ENCOUNTER — Ambulatory Visit: Payer: Medicare Other

## 2018-01-01 NOTE — Telephone Encounter (Signed)
See refill request for Bentyl. LOV 09/10/17 PCP Dr. Olevia Perches Medication not addressed on recent visits.  Routing to office for refill consideration.

## 2018-01-07 ENCOUNTER — Ambulatory Visit (INDEPENDENT_AMBULATORY_CARE_PROVIDER_SITE_OTHER): Payer: Medicare Other

## 2018-01-07 DIAGNOSIS — Z95810 Presence of automatic (implantable) cardiac defibrillator: Secondary | ICD-10-CM | POA: Diagnosis not present

## 2018-01-07 DIAGNOSIS — I5022 Chronic systolic (congestive) heart failure: Secondary | ICD-10-CM

## 2018-01-07 NOTE — Progress Notes (Signed)
EPIC Encounter for ICM Monitoring  Patient Name: Bonnie Ochoa is a 52 y.o. female Date: 01/07/2018 Primary Care Physican: Valerie Roys, DO Primary Cardiologist: Sanjuana Letters, NP Electrophysiologist: Faustino Congress Weight:210lbs        Heart Failure questions reviewed, pt asymptomatic.   Thoracic impedance normal.  Prescribed dosage: Furosemide 20 mg 1 tablet (20 mg total) daily.   Labs: 11/14/2017 Creatinine 0.97, BUN 19, Potassium 3.1, Sodium 139, EGFR >60 08/22/2017 Creatinine 1.11, BUN 16, Potassium 4.5, Sodium 142, EGFR 57-66  08/01/2017 Creatinine 1.03, BUN 17, Potassium 4.2, Sodium 141, EGFR 62-72  07/15/2017 Creatinine 1.24, BUN 15, Potassium 3.8, Sodium 141, EGFR 50-57  07/05/2017 Creatinine 2.04, BUN 55, Potassium 3.5, Sodium 134, EGFR 27-31  07/04/2017 Creatinine 3.63, BUN 100, Potassium 3.5, Sodium 132, EGFR 13-15  07/03/2017 Creatinine 1.74, BUN 105, Potassium 3.6, Sodium 129, EGFR 10-11  07/02/2017 Creatinine 5.45, BUN 114, Potassium 4.3, Sodium 120, EGFR 8-9  02/20/2017 Creatinine 1.91, BUN 61, Potassium 4.5, Sodium 134, EGFR 29-34 02/01/2017 Creatinine 1.47, BUN 25, Potassium 4.6, Sodium 138, EGFR 40-47 12/28/2016 Creatinine 1.28, BUN 34, Potassium 5.2, Sodium 139, EGFR 48-55 12/08/2016 Creatinine 1.15, BUN 30, Potassium 3.9, Sodium 138, EGFR 54->60 12/07/2016 Creatinine 1.14, BUN 32, Potassium 4.2, Sodium 135, EGFR 52->60 12/04/2016 Creatinine 1.18, BUN 30, Potassium 3.9, Sodium 137, EGFR 54->60 11/05/2016 Creatinine 0.81, BUN 22, Potassium 3.9, Sodium 136, EGFR >60 09/18/2016 Creatinine 1.34, BUN 28, Potassium 3.3, Sodium 138, EGFR 45-52  09/17/2016 Creatinine 1.20, BUN 24, Potassium 3.4, Sodium 138, EGFR 51-59  09/11/2016 Creatinine 1.14, BUN 22, Potassium 3.3, Sodium 138, EGFR 54->60  01/28/2018Creatinine 1.13, BUN 24, Potassium 3.6, Sodium 136, EGFR 55->60  Recommendations: No changes.   Encouraged to call for fluid symptoms.  Follow-up plan: ICM  clinic phone appointment on 02/07/2018.    Copy of ICM check sent to Dr. Caryl Comes.   3 month ICM trend: 01/07/2018    1 Year ICM trend:       Rosalene Billings, RN 01/07/2018 11:53 AM

## 2018-01-08 ENCOUNTER — Ambulatory Visit: Payer: Medicare Other

## 2018-01-10 ENCOUNTER — Ambulatory Visit: Payer: Medicare Other | Admitting: Psychiatry

## 2018-01-22 ENCOUNTER — Encounter: Payer: Self-pay | Admitting: Gastroenterology

## 2018-01-22 ENCOUNTER — Ambulatory Visit: Payer: Medicare Other | Admitting: Gastroenterology

## 2018-01-24 ENCOUNTER — Other Ambulatory Visit: Payer: Self-pay | Admitting: Family Medicine

## 2018-02-01 ENCOUNTER — Other Ambulatory Visit: Payer: Self-pay | Admitting: Cardiovascular Disease

## 2018-02-07 ENCOUNTER — Ambulatory Visit (INDEPENDENT_AMBULATORY_CARE_PROVIDER_SITE_OTHER): Payer: Medicare Other

## 2018-02-07 ENCOUNTER — Telehealth: Payer: Self-pay

## 2018-02-07 DIAGNOSIS — I5022 Chronic systolic (congestive) heart failure: Secondary | ICD-10-CM | POA: Diagnosis not present

## 2018-02-07 DIAGNOSIS — Z95810 Presence of automatic (implantable) cardiac defibrillator: Secondary | ICD-10-CM

## 2018-02-07 NOTE — Telephone Encounter (Signed)
Remote ICM transmission received.  Attempted call to patient and left detailed message, per DPR, regarding transmission and next ICM scheduled for 03/13/2018.  Advised to return call for any fluid symptoms or questions.    

## 2018-02-07 NOTE — Progress Notes (Signed)
EPIC Encounter for ICM Monitoring  Patient Name: Bonnie Ochoa is a 52 y.o. female Date: 02/07/2018 Primary Care Physican: Valerie Roys, DO Primary Cardiologist: Sanjuana Letters, NP Electrophysiologist: Faustino Congress Weight: Previous weight210lbs       Attempted call to patient and unable to reach.  Left detailed message, per DPR, regarding transmission.  Transmission reviewed.    Thoracic impedance normal.  Prescribed dosage: Furosemide20 mg 1 tablet (52m total) daily.   Labs: 11/14/2017 Creatinine0.97, BUN19, Potassium3.1, Sodium139, EGFR>60 08/22/2017 Creatinine1.11, BUN16, Potassium4.5, [id number] 08/01/2017 Creatinine1.03, BUN17, Potassium4.2, [id number] 07/15/2017 Creatinine1.24, BUN15, Potassium3.8, Sodium141, EGFR50-57  07/05/2017 Creatinine2.04, BUN55, Potassium3.5, [id number] 07/04/2017 Creatinine3.63, BUN100, Potassium3.5, Sodium132, [id number] 07/03/2017 Creatinine1.74, BUN105, Potassium3.6, [id number] EGFR10-11  07/02/2017 Creatinine5.45, [id number] Potassium4.3, [id number] EGFR8-9 02/20/2017 Creatinine 1.91, BUN 61, Potassium 4.5, Sodium 134, EGFR 29-34 02/01/2017 Creatinine 1.47, BUN 25, Potassium 4.6, Sodium 138, EGFR 40-47  Recommendations:Left voice mail with ICM number and encouraged to call if experiencing any fluid symptoms.  Follow-up plan: ICM clinic phone appointment on 03/13/2018.  Copy of ICM check sent to Dr. KCaryl Comes   3 month ICM trend: 02/07/2018    1 Year ICM trend:       LRosalene Billings RN 02/07/2018 9:04 AM

## 2018-02-08 ENCOUNTER — Other Ambulatory Visit: Payer: Self-pay

## 2018-02-08 ENCOUNTER — Other Ambulatory Visit: Payer: Self-pay | Admitting: Cardiovascular Disease

## 2018-02-08 DIAGNOSIS — R112 Nausea with vomiting, unspecified: Secondary | ICD-10-CM | POA: Insufficient documentation

## 2018-02-08 DIAGNOSIS — Z87891 Personal history of nicotine dependence: Secondary | ICD-10-CM | POA: Diagnosis not present

## 2018-02-08 DIAGNOSIS — Z79899 Other long term (current) drug therapy: Secondary | ICD-10-CM | POA: Diagnosis not present

## 2018-02-08 DIAGNOSIS — I110 Hypertensive heart disease with heart failure: Secondary | ICD-10-CM | POA: Diagnosis not present

## 2018-02-08 DIAGNOSIS — Z950 Presence of cardiac pacemaker: Secondary | ICD-10-CM | POA: Insufficient documentation

## 2018-02-08 DIAGNOSIS — J45909 Unspecified asthma, uncomplicated: Secondary | ICD-10-CM | POA: Insufficient documentation

## 2018-02-08 DIAGNOSIS — R1084 Generalized abdominal pain: Secondary | ICD-10-CM | POA: Insufficient documentation

## 2018-02-08 DIAGNOSIS — R51 Headache: Secondary | ICD-10-CM | POA: Diagnosis not present

## 2018-02-08 DIAGNOSIS — R1110 Vomiting, unspecified: Secondary | ICD-10-CM | POA: Diagnosis not present

## 2018-02-08 DIAGNOSIS — I5022 Chronic systolic (congestive) heart failure: Secondary | ICD-10-CM | POA: Insufficient documentation

## 2018-02-08 DIAGNOSIS — E119 Type 2 diabetes mellitus without complications: Secondary | ICD-10-CM | POA: Insufficient documentation

## 2018-02-08 DIAGNOSIS — F909 Attention-deficit hyperactivity disorder, unspecified type: Secondary | ICD-10-CM | POA: Insufficient documentation

## 2018-02-08 DIAGNOSIS — E039 Hypothyroidism, unspecified: Secondary | ICD-10-CM | POA: Diagnosis not present

## 2018-02-08 DIAGNOSIS — F319 Bipolar disorder, unspecified: Secondary | ICD-10-CM | POA: Diagnosis not present

## 2018-02-08 DIAGNOSIS — R109 Unspecified abdominal pain: Secondary | ICD-10-CM | POA: Diagnosis not present

## 2018-02-08 DIAGNOSIS — I2510 Atherosclerotic heart disease of native coronary artery without angina pectoris: Secondary | ICD-10-CM | POA: Diagnosis not present

## 2018-02-08 DIAGNOSIS — R42 Dizziness and giddiness: Secondary | ICD-10-CM | POA: Diagnosis not present

## 2018-02-08 NOTE — ED Triage Notes (Signed)
Patient reports headache and dizziness for the past week.  Reports pain will ease, but never totally goes away.  During triage patient reports having chest pain that comes and goes.

## 2018-02-09 ENCOUNTER — Emergency Department
Admission: EM | Admit: 2018-02-09 | Discharge: 2018-02-09 | Disposition: A | Discharge status: Home / Self Care | Payer: Medicare Other | Attending: Emergency Medicine | Admitting: Emergency Medicine

## 2018-02-09 ENCOUNTER — Emergency Department: Payer: Medicare Other

## 2018-02-09 ENCOUNTER — Encounter: Payer: Self-pay | Admitting: Radiology

## 2018-02-09 DIAGNOSIS — R112 Nausea with vomiting, unspecified: Secondary | ICD-10-CM

## 2018-02-09 DIAGNOSIS — R109 Unspecified abdominal pain: Secondary | ICD-10-CM | POA: Diagnosis not present

## 2018-02-09 DIAGNOSIS — R1084 Generalized abdominal pain: Secondary | ICD-10-CM

## 2018-02-09 DIAGNOSIS — R1110 Vomiting, unspecified: Secondary | ICD-10-CM | POA: Diagnosis not present

## 2018-02-09 LAB — CBC
HEMATOCRIT: 34.7 % — AB (ref 35.0–47.0)
HEMOGLOBIN: 12.2 g/dL (ref 12.0–16.0)
MCH: 32.2 pg (ref 26.0–34.0)
MCHC: 35 g/dL (ref 32.0–36.0)
MCV: 92 fL (ref 80.0–100.0)
Platelets: 216 10*3/uL (ref 150–440)
RBC: 3.78 MIL/uL — AB (ref 3.80–5.20)
RDW: 13.5 % (ref 11.5–14.5)
WBC: 7.6 10*3/uL (ref 3.6–11.0)

## 2018-02-09 LAB — BASIC METABOLIC PANEL
Anion gap: 11 (ref 5–15)
BUN: 19 mg/dL (ref 6–20)
CHLORIDE: 103 mmol/L (ref 98–111)
CO2: 23 mmol/L (ref 22–32)
CREATININE: 1.68 mg/dL — AB (ref 0.44–1.00)
Calcium: 9.3 mg/dL (ref 8.9–10.3)
GFR calc non Af Amer: 33 mL/min — ABNORMAL LOW (ref >60)
GFR, EST AFRICAN AMERICAN: 39 mL/min — AB (ref >60)
GLUCOSE: 302 mg/dL — AB (ref 70–99)
Potassium: 3.1 mmol/L — ABNORMAL LOW (ref 3.5–5.1)
Sodium: 137 mmol/L (ref 135–145)

## 2018-02-09 LAB — URINALYSIS, COMPLETE (UACMP) WITH MICROSCOPIC
BACTERIA UA: NONE SEEN
Bilirubin Urine: NEGATIVE
Glucose, UA: 150 mg/dL — AB
HGB URINE DIPSTICK: NEGATIVE
Ketones, ur: NEGATIVE mg/dL
LEUKOCYTES UA: NEGATIVE
Nitrite: NEGATIVE
PH: 6 (ref 5.0–8.0)
Protein, ur: NEGATIVE mg/dL
SPECIFIC GRAVITY, URINE: 1.014 (ref 1.005–1.030)

## 2018-02-09 LAB — TROPONIN I
Troponin I: 0.03 ng/mL (ref <0.03)
Troponin I: 0.03 ng/mL (ref <0.03)

## 2018-02-09 MED ORDER — ONDANSETRON HCL 4 MG/2ML IJ SOLN
4.0000 mg | Freq: Once | INTRAMUSCULAR | Status: AC
  Administered 2018-02-09: 4 mg via INTRAVENOUS

## 2018-02-09 MED ORDER — MORPHINE SULFATE (PF) 2 MG/ML IV SOLN
2.0000 mg | Freq: Once | INTRAVENOUS | Status: AC
  Administered 2018-02-09: 2 mg via INTRAVENOUS

## 2018-02-09 MED ORDER — METOCLOPRAMIDE HCL 10 MG PO TABS: 10.0000 mg | ORAL_TABLET | Freq: Three times a day (TID) | ORAL | 1 refills | Status: DC

## 2018-02-09 MED ORDER — TRAMADOL HCL 50 MG PO TABS: 50.0000 mg | ORAL_TABLET | Freq: Four times a day (QID) | ORAL | 0 refills | Status: DC | PRN

## 2018-02-09 MED ORDER — MORPHINE SULFATE (PF) 2 MG/ML IV SOLN
2.0000 mg | Freq: Once | INTRAVENOUS | Status: AC
  Administered 2018-02-09: 2 mg via INTRAVENOUS
  Filled 2018-02-09: qty 1

## 2018-02-09 MED ORDER — ONDANSETRON HCL 4 MG/2ML IJ SOLN
INTRAMUSCULAR | Status: AC
  Filled 2018-02-09: qty 2

## 2018-02-09 MED ORDER — MORPHINE SULFATE (PF) 2 MG/ML IV SOLN
INTRAVENOUS | Status: AC
  Administered 2018-02-09: 2 mg via INTRAVENOUS
  Filled 2018-02-09: qty 1

## 2018-02-09 MED ORDER — ONDANSETRON HCL 4 MG/2ML IJ SOLN
4.0000 mg | Freq: Once | INTRAMUSCULAR | Status: AC
  Administered 2018-02-09: 4 mg via INTRAVENOUS
  Filled 2018-02-09: qty 2

## 2018-02-09 MED ORDER — METOCLOPRAMIDE HCL 5 MG/ML IJ SOLN
20.0000 mg | Freq: Once | INTRAVENOUS | Status: AC
  Administered 2018-02-09: 20 mg via INTRAVENOUS
  Filled 2018-02-09: qty 4

## 2018-02-09 MED ORDER — IOHEXOL 350 MG/ML SOLN
60.0000 mL | Freq: Once | INTRAVENOUS | Status: AC | PRN
  Administered 2018-02-09: 60 mL via INTRAVENOUS

## 2018-02-09 MED ORDER — SODIUM CHLORIDE 0.9 % IV BOLUS
1000.0000 mL | Freq: Once | INTRAVENOUS | Status: AC
  Administered 2018-02-09: 1000 mL via INTRAVENOUS

## 2018-02-09 NOTE — ED Notes (Signed)
Pt unable to give urine sample at this time 

## 2018-02-09 NOTE — ED Notes (Signed)
Patient transported to CT 

## 2018-02-09 NOTE — ED Provider Notes (Signed)
[facility] Emergency Department Provider Note _______________   First MD Initiated Contact with Patient 02/09/18 0300     (approximate)  I have reviewed the triage vital signs and the nursing notes.   HISTORY  Chief Complaint Dizziness and Headache    HPI Bonnie Ochoa is a 52 y.o. female with below list of chronic medical conditions including chronic abdominal pain and vomiting, diabetes for which the patient is prescribed insulin which she states that she has discontinued taking although she does have the medication at home presents to the emergency department with generalized abdominal pain which patient states is currently 10 out of 10 as well as vomiting which has been occurring for the past week.  Patient denies any diarrhea no fever.  Patient does admit to headache however no weakness numbness gait instability or visual changes.   Past Medical History:  Diagnosis Date  . ADHD   . Arthritis   . Asthma   . Bipolar 1 disorder ([place])   . CHF (congestive heart failure) ([place])   . Cirrhosis of liver ([place])   . Coronary artery disease   . Depression   . Diabetes mellitus without complication ([place])   . Diverticulitis   . Hypertension   . IBS (irritable bowel syndrome)   . Insomnia   . Migraines   . PTSD (post-traumatic stress disorder)   . Restless leg syndrome   . Sleep apnea   . Vertigo     Patient Active Problem List   Diagnosis Date Noted  . Hyperglycemia   . Hypothyroid 04/30/2017  . HTN (hypertension) 03/11/2017  . Multiple thyroid nodules 01/29/2017  . Chronic systolic heart failure ([place]) 10/08/2016  . Obstructive sleep apnea 10/08/2016  . Noncompliance 09/18/2016  . Cirrhosis ([place]) 09/18/2016  . Pulmonary hypertension ([place]) 05/30/2016  . Hypokalemia 05/30/2016  . NICM (nonischemic cardiomyopathy) ([place]) 05/30/2016  . Other ascites   . Benign hypertensive renal disease   . DM (diabetes mellitus), type 2 with renal complications  ([place])   . Depression   . Bipolar 1 disorder ([place])   . Asthma   . Generalized abdominal pain 05/07/2016    Past Surgical History:  Procedure Laterality Date  . ABDOMINAL HYSTERECTOMY    . debribalator  2016  . INSERT / REPLACE / REMOVE PACEMAKER    . INSERTION OF ICD    . TONSILECTOMY, ADENOIDECTOMY, BILATERAL MYRINGOTOMY AND TUBES    . TONSILLECTOMY    . TONSILLECTOMY    . TUBAL LIGATION  1980  . TUBAL LIGATION      Prior to Admission medications   Medication Sig Start Date End Date Taking? Authorizing Provider  atorvastatin (LIPITOR) 40 MG tablet TAKE 1 TABLET BY MOUTH EVERY DAY--NEEDS APPT BEFORE RUN OUT 12/24/17   Minna Merritts, MD  blood glucose meter kit and supplies KIT Dispense based on patient and insurance preference. Use up to four times daily as directed. (FOR ICD-9 250.00, 250.01). 07/24/17   Park Liter P, DO  carvedilol (COREG) 6.25 MG tablet Take 1 tablet (6.25 mg total) by mouth 2 (two) times daily with a meal. 02/03/18   Gollan, Kathlene November, MD  dicyclomine (BENTYL) 20 MG tablet TAKE 1 TABLET (20 MG TOTAL) BY MOUTH 3 (THREE) TIMES DAILY AS NEEDED FOR SPASMS. 01/02/18   Johnson, Megan P, DO  furosemide (LASIX) 20 MG tablet Take 1 tablet (20 mg total) by mouth daily. 12/23/17   Minna Merritts, MD  Insulin Glargine (LANTUS SOLOSTAR) 100 UNIT/ML  Solostar Pen Inject 40 Units into the skin daily at 10 pm. 07/15/17   Park Liter P, DO  Insulin Pen Needle 32G X 6 MM MISC 1 each by Does not apply route daily. 07/15/17   Johnson, Megan P, DO  lamoTRIgine (LAMICTAL) 25 MG tablet Take 1 tablet (25 mg total) by mouth 2 (two) times daily. 10/16/17   [name], MD  metoCLOPramide (REGLAN) 10 MG tablet Take 1 tablet (10 mg total) by mouth 3 (three) times daily with meals. 02/09/18 02/09/19  Gregor Hams, MD  neomycin-polymyxin-hydrocortisone (CORTISPORIN) OTIC solution Place 3 drops into the right ear 4 (four) times daily. 09/12/17   Johnson, Megan P, DO  nitroGLYCERIN  (NITROSTAT) 0.4 MG SL tablet Place 1 tablet (0.4 mg total) under the tongue every 5 (five) minutes as needed for chest pain. 06/04/16   Gladstone Lighter, MD  pantoprazole (PROTONIX) 40 MG tablet Take 1 tablet (40 mg total) by mouth daily. 08/28/17 08/28/18  [name], MD  PARoxetine (PAXIL) 30 MG tablet Take 1 tablet (30 mg total) by mouth daily. 09/16/17   [name], MD  potassium chloride SA (K-DUR,KLOR-CON) 20 MEQ tablet Take 1 tablet (20 mEq total) by mouth as directed. Take with fluid pill and as directed 12/09/17   Minna Merritts, MD  QUEtiapine (SEROQUEL) 300 MG tablet Take 1 tablet (300 mg total) by mouth at bedtime. 09/16/17   [name], MD  sacubitril-valsartan (ENTRESTO) 49-51 MG Take 1 tablet by mouth 2 (two) times daily. 11/22/17   Alisa Graff, FNP  spironolactone (ALDACTONE) 25 MG tablet TAKE 1 TABLET (25 MG TOTAL) BY MOUTH DAILY. 07/13/17   [provider]  traMADol (ULTRAM) 50 MG tablet Take 1 tablet (50 mg total) by mouth every 6 (six) hours as needed. 02/09/18 02/09/19  Gregor Hams, MD    Allergies Levothyroxine  Family History  Problem Relation Age of Onset  . Hypertension Mother   . Hyperlipidemia Mother   . Heart disease Mother   . Hypertension Sister   . Asthma Sister   . Heart disease Sister   . Diabetes Sister   . Cancer Sister   . Alzheimer's disease Maternal Grandfather   . Hyperlipidemia Brother   . Asthma Sister   . Hypertension Sister   . Diabetes Sister     Social History Social History   Tobacco Use  . Smoking status: Former Research scientist (life sciences)  . Smokeless tobacco: Never Used  Substance Use Topics  . Alcohol use: No  . Drug use: Yes    Types: Marijuana    Comment: 1/29 smoked today    Review of Systems Constitutional: No fever/chills Eyes: No visual changes. ENT: No sore throat. Cardiovascular: Denies chest pain. Respiratory: Denies shortness of breath. Gastrointestinal: Positive for abdominal pain and  vomiting Genitourinary: Negative for dysuria. Musculoskeletal: Negative for neck pain.  Negative for back pain. Integumentary: Negative for rash. Neurological: Positive for headaches, negative for focal weakness or numbness.   ____________________________________________   PHYSICAL EXAM:  VITAL SIGNS: ED Triage Vitals  Enc Vitals Group     BP 02/09/18 0003 133/87     Pulse Rate 02/09/18 0003 88     Resp 02/09/18 0003 18     Temp 02/09/18 0003 98.8 F (37.1 C)     Temp Source 02/09/18 0003 Oral     SpO2 02/09/18 0003 97 %     Weight 02/08/18 2358 90.7 kg (200 lb)     Height 02/08/18 2358 1.651 m (5'  5")     Head Circumference --      Peak Flow --      Pain Score 02/08/18 2358 10     Pain Loc --      Pain Edu? --      Excl. in [place]? --     Constitutional: Alert and oriented. Well appearing and in no acute distress. Eyes: Conjunctivae are normal. Head: Atraumatic. Mouth/Throat: Mucous membranes are moist.  Oropharynx non-erythematous. Neck: No stridor.   Cardiovascular: Normal rate, regular rhythm. Good peripheral circulation. Grossly normal heart sounds. Respiratory: Normal respiratory effort.  No retractions. Lungs CTAB. Gastrointestinal: Epigastric tenderness to palpation. No distention.  Musculoskeletal: No lower extremity tenderness nor edema. No gross deformities of extremities. Neurologic:  Normal speech and language. No gross focal neurologic deficits are appreciated.  Skin:  Skin is warm, dry and intact. No rash noted. Psychiatric: Mood and affect are normal. Speech and behavior are normal.  ____________________________________________   LABS (all labs ordered are listed, but only abnormal results are displayed)  Labs Reviewed  BASIC METABOLIC PANEL - Abnormal; Notable for the following components:      Result Value   Potassium 3.1 (*)    Glucose, Bld 302 (*)    Creatinine, Ser 1.68 (*)    GFR calc non Af Amer 33 (*)    GFR calc Af Amer 39 (*)    All  other components within normal limits  CBC - Abnormal; Notable for the following components:   RBC 3.78 (*)    HCT 34.7 (*)    All other components within normal limits  URINALYSIS, COMPLETE (UACMP) WITH MICROSCOPIC - Abnormal; Notable for the following components:   Color, Urine STRAW (*)    APPearance CLEAR (*)    Glucose, UA 150 (*)    All other components within normal limits  TROPONIN I - Abnormal; Notable for the following components:   Troponin I 0.03 (*)    All other components within normal limits  TROPONIN I - Abnormal; Notable for the following components:   Troponin I 0.03 (*)    All other components within normal limits   ____________________________________________  EKG  ED ECG REPORT I, [place] Kaiyan Luczak, the attending physician, personally viewed and interpreted this ECG.   Date: 02/09/2018  EKG Time: 12:01 AM  Rate: 86  Rhythm: Normal sinus rhythm  Axis: Normal  Intervals: Normal  ST&T Change: None  ____________________________________________  RADIOLOGY I,  N Brihany Butch, personally viewed and evaluated these images (plain radiographs) as part of my medical decision making, as well as reviewing the written report by the radiologist.  ED MD interpretation: No acute abdomen or pelvic pathology per radiologist CT scan  Official radiology report(s): Ct Abdomen Pelvis W Contrast  Result Date: 02/09/2018 CLINICAL DATA:  Nausea, vomiting and abdominal pain. EXAM: CT ABDOMEN AND PELVIS WITH CONTRAST TECHNIQUE: Multidetector CT imaging of the abdomen and pelvis was performed using the standard protocol following bolus administration of intravenous contrast. CONTRAST:  13m OMNIPAQUE IOHEXOL 350 MG/ML SOLN COMPARISON:  CT abdomen pelvis 12/08/2016 FINDINGS: LOWER CHEST: No basilar pulmonary nodules or pleural effusion. No apical pericardial effusion. HEPATOBILIARY: Normal hepatic contours and density. No intra- or extrahepatic biliary dilatation. Normal gallbladder.  PANCREAS: Normal parenchymal contours without ductal dilatation. No peripancreatic fluid collection. SPLEEN: Normal. ADRENALS/URINARY TRACT: --Adrenal glands: Normal. --Right kidney/ureter: No hydronephrosis, nephroureterolithiasis, perinephric stranding or solid renal mass. --Left kidney/ureter: No hydronephrosis, nephroureterolithiasis, perinephric stranding or solid renal mass. --Urinary bladder: Normal for degree of  distention STOMACH/BOWEL: --Stomach/Duodenum: No hiatal hernia or other gastric abnormality. Normal duodenal course. --Small bowel: No dilatation or inflammation. --Colon: No focal abnormality. --Appendix: Normal. VASCULAR/LYMPHATIC: Normal course and caliber of the major abdominal vessels. No abdominal or pelvic lymphadenopathy. REPRODUCTIVE: Status post hysterectomy. No adnexal mass. MUSCULOSKELETAL. No bony spinal canal stenosis or focal osseous abnormality. L4-L5 endplate remodeling. OTHER: None. IMPRESSION: No acute abnormality of the abdomen or pelvis. Electronically Signed   By: Ulyses Jarred M.D.   On: 02/09/2018 05:05      Procedures   ____________________________________________   INITIAL IMPRESSION / ASSESSMENT AND PLAN / ED COURSE  As part of my medical decision making, I reviewed the following data within the electronic MEDICAL RECORD NUMBER   52 year old female presented with above-stated history and physical exam.  Given concern for possible intra-abdominal pathology CT scan of the abdomen and pelvis was performed which revealed no acute pathology.  Spoke with the patient at length regarding the possibility of gastroparesis given uncontrolled diabetes.  I stressed the importance of the patient taking her diabetic medications as prescribed and the potential risk of not doing so.  Patient was given IV morphine and Zofran the emergency department with improvement of symptoms.  Will prescribe Reglan and tramadol for home with recommendation for outpatient follow-up.     ____________________________________________  FINAL CLINICAL IMPRESSION(S) / ED DIAGNOSES  Final diagnoses:  Generalized abdominal pain  Non-intractable vomiting with nausea, unspecified vomiting type     MEDICATIONS GIVEN DURING THIS VISIT:  Medications  sodium chloride 0.9 % bolus 1,000 mL (0 mLs Intravenous Stopped 02/09/18 0607)  ondansetron (ZOFRAN) injection 4 mg (4 mg Intravenous Given 02/09/18 0341)  morphine 2 MG/ML injection 2 mg (2 mg Intravenous Given 02/09/18 0341)  iohexol (OMNIPAQUE) 350 MG/ML injection 60 mL (60 mLs Intravenous Contrast Given 02/09/18 0430)  morphine 2 MG/ML injection 2 mg (2 mg Intravenous Given 02/09/18 0456)  ondansetron (ZOFRAN) injection 4 mg (4 mg Intravenous Given 02/09/18 0524)  metoCLOPramide (REGLAN) 20 mg in dextrose 5 % 50 mL IVPB (0 mg Intravenous Stopped 02/09/18 0646)     ED Discharge Orders        Ordered    metoCLOPramide (REGLAN) 10 MG tablet  3 times daily with meals     02/09/18 0648    traMADol (ULTRAM) 50 MG tablet  Every 6 hours PRN     02/09/18 0648       Note:  This document was prepared using Dragon voice recognition software and may include unintentional dictation errors.    Gregor Hams, MD 02/09/18 [phone]

## 2018-02-09 NOTE — ED Notes (Signed)
Pt verbalizes understanding of discharge instructions.

## 2018-02-10 NOTE — Telephone Encounter (Signed)
Please advise if patient should be currently taking this medication.  I do not see it on patients medication list and do not see where it was discontinued. Thanks !

## 2018-02-14 ENCOUNTER — Emergency Department: Payer: Medicare Other

## 2018-02-14 ENCOUNTER — Emergency Department
Admission: EM | Admit: 2018-02-14 | Discharge: 2018-02-14 | Disposition: A | Discharge status: Home / Self Care | Payer: Medicare Other | Attending: Emergency Medicine | Admitting: Emergency Medicine

## 2018-02-14 ENCOUNTER — Other Ambulatory Visit: Payer: Self-pay

## 2018-02-14 ENCOUNTER — Encounter: Payer: Self-pay | Admitting: Emergency Medicine

## 2018-02-14 DIAGNOSIS — K2970 Gastritis, unspecified, without bleeding: Secondary | ICD-10-CM | POA: Insufficient documentation

## 2018-02-14 DIAGNOSIS — R0789 Other chest pain: Secondary | ICD-10-CM | POA: Diagnosis not present

## 2018-02-14 DIAGNOSIS — F1210 Cannabis abuse, uncomplicated: Secondary | ICD-10-CM | POA: Insufficient documentation

## 2018-02-14 DIAGNOSIS — Z794 Long term (current) use of insulin: Secondary | ICD-10-CM | POA: Diagnosis not present

## 2018-02-14 DIAGNOSIS — I509 Heart failure, unspecified: Secondary | ICD-10-CM | POA: Diagnosis not present

## 2018-02-14 DIAGNOSIS — R197 Diarrhea, unspecified: Secondary | ICD-10-CM | POA: Diagnosis not present

## 2018-02-14 DIAGNOSIS — E039 Hypothyroidism, unspecified: Secondary | ICD-10-CM | POA: Diagnosis not present

## 2018-02-14 DIAGNOSIS — R112 Nausea with vomiting, unspecified: Secondary | ICD-10-CM | POA: Diagnosis present

## 2018-02-14 DIAGNOSIS — I2510 Atherosclerotic heart disease of native coronary artery without angina pectoris: Secondary | ICD-10-CM | POA: Diagnosis not present

## 2018-02-14 DIAGNOSIS — Z87891 Personal history of nicotine dependence: Secondary | ICD-10-CM | POA: Diagnosis not present

## 2018-02-14 DIAGNOSIS — Z950 Presence of cardiac pacemaker: Secondary | ICD-10-CM | POA: Diagnosis not present

## 2018-02-14 DIAGNOSIS — I110 Hypertensive heart disease with heart failure: Secondary | ICD-10-CM | POA: Diagnosis not present

## 2018-02-14 DIAGNOSIS — E119 Type 2 diabetes mellitus without complications: Secondary | ICD-10-CM | POA: Insufficient documentation

## 2018-02-14 DIAGNOSIS — J45909 Unspecified asthma, uncomplicated: Secondary | ICD-10-CM | POA: Diagnosis not present

## 2018-02-14 DIAGNOSIS — Z79899 Other long term (current) drug therapy: Secondary | ICD-10-CM | POA: Insufficient documentation

## 2018-02-14 DIAGNOSIS — R079 Chest pain, unspecified: Secondary | ICD-10-CM | POA: Insufficient documentation

## 2018-02-14 DIAGNOSIS — R1010 Upper abdominal pain, unspecified: Secondary | ICD-10-CM | POA: Diagnosis not present

## 2018-02-14 LAB — BASIC METABOLIC PANEL
Anion gap: 15 (ref 5–15)
BUN: 21 mg/dL — ABNORMAL HIGH (ref 6–20)
CHLORIDE: 100 mmol/L (ref 98–111)
CO2: 23 mmol/L (ref 22–32)
CREATININE: 1.39 mg/dL — AB (ref 0.44–1.00)
Calcium: 9.3 mg/dL (ref 8.9–10.3)
GFR calc Af Amer: 49 mL/min — ABNORMAL LOW (ref >60)
GFR calc non Af Amer: 42 mL/min — ABNORMAL LOW (ref >60)
Glucose, Bld: 181 mg/dL — ABNORMAL HIGH (ref 70–99)
POTASSIUM: 3 mmol/L — AB (ref 3.5–5.1)
SODIUM: 138 mmol/L (ref 135–145)

## 2018-02-14 LAB — CBC
HEMATOCRIT: 39.3 % (ref 35.0–47.0)
Hemoglobin: 13.5 g/dL (ref 12.0–16.0)
MCH: 31.4 pg (ref 26.0–34.0)
MCHC: 34.5 g/dL (ref 32.0–36.0)
MCV: 91.1 fL (ref 80.0–100.0)
Platelets: 279 10*3/uL (ref 150–440)
RBC: 4.31 MIL/uL (ref 3.80–5.20)
RDW: 13.5 % (ref 11.5–14.5)
WBC: 8.5 10*3/uL (ref 3.6–11.0)

## 2018-02-14 LAB — LIPASE, BLOOD: Lipase: 34 U/L (ref 11–51)

## 2018-02-14 LAB — TROPONIN I: Troponin I: 0.03 ng/mL (ref <0.03)

## 2018-02-14 MED ORDER — SUCRALFATE 1 G PO TABS: 1.0000 g | ORAL_TABLET | Freq: Four times a day (QID) | ORAL | 0 refills | Status: DC

## 2018-02-14 MED ORDER — DICYCLOMINE HCL 10 MG PO CAPS
10.0000 mg | ORAL_CAPSULE | Freq: Once | ORAL | Status: AC
  Administered 2018-02-14: 10 mg via ORAL
  Filled 2018-02-14: qty 1

## 2018-02-14 MED ORDER — ONDANSETRON 4 MG PO TBDP: 4.0000 mg | ORAL_TABLET | Freq: Three times a day (TID) | ORAL | 0 refills | Status: DC | PRN

## 2018-02-14 MED ORDER — FAMOTIDINE 40 MG PO TABS: 40.0000 mg | ORAL_TABLET | Freq: Every evening | ORAL | 1 refills | Status: DC

## 2018-02-14 MED ORDER — GI COCKTAIL ~~LOC~~
30.0000 mL | Freq: Once | ORAL | Status: AC
  Administered 2018-02-14: 30 mL via ORAL
  Filled 2018-02-14: qty 30

## 2018-02-14 MED ORDER — ONDANSETRON 4 MG PO TBDP
4.0000 mg | ORAL_TABLET | Freq: Once | ORAL | Status: AC
  Administered 2018-02-14: 4 mg via ORAL
  Filled 2018-02-14: qty 1

## 2018-02-14 NOTE — ED Notes (Signed)
MD at bedside. 

## 2018-02-14 NOTE — ED Notes (Signed)
MD to bedside to talk to pt and pts wife.

## 2018-02-14 NOTE — Discharge Instructions (Addendum)
Please seek medical attention for any high fevers, chest pain, shortness of breath, change in behavior, persistent vomiting, bloody stool or any other new or concerning symptoms.  

## 2018-02-14 NOTE — ED Triage Notes (Signed)
Pt comes into the ED via POV c/o chest pain, N/V/D, and dizziness that has been ongoing for 2 weeks.  Patient seen here last week and sent home with nausea medication, but it has been unsuccessful.  Patient states she cant keep any food or liquids down.  Patient moaning from discomfort in the triage area at this time.  Patient has even and unlabored respirations at this time.  No diaphoresis present.

## 2018-02-14 NOTE — ED Provider Notes (Signed)
[facility] Emergency Department Provider Note   ____________________________________________   I have reviewed the triage vital signs and the nursing notes.   HISTORY  Chief Complaint Chest Pain; Nausea; and Diarrhea   History limited by: Not Limited   HPI Bonnie Ochoa is a 52 y.o. female who presents to the emergency department today because of concerns for continued nausea vomiting and pain.  The patient's pain is located in the center chest and upper abdomen.  This has been accompanied by nausea and inability to keep down any p.o.  Symptoms have been going on for the past 2 weeks.  The patient denies any unusual ingestions or travel prior to the symptoms starting.  Was evaluated in the emergency department roughly 1 week ago for the symptoms.  Had a negative work-up and CT scan done at that time.  Patient states that symptoms have persisted.  She is not noticed any blood or dark or tarry stool.   Per medical record review patient has a history of emergency department visit roughly 1 week ago for similar symptoms. Had reassuring work up at that time including negative ct scan.  Past Medical History:  Diagnosis Date  . ADHD   . Arthritis   . Asthma   . Bipolar 1 disorder ([place])   . CHF (congestive heart failure) ([place])   . Cirrhosis of liver ([place])   . Coronary artery disease   . Depression   . Diabetes mellitus without complication ([place])   . Diverticulitis   . Hypertension   . IBS (irritable bowel syndrome)   . Insomnia   . Migraines   . PTSD (post-traumatic stress disorder)   . Restless leg syndrome   . Sleep apnea   . Vertigo     Patient Active Problem List   Diagnosis Date Noted  . Hyperglycemia   . Hypothyroid 04/30/2017  . HTN (hypertension) 03/11/2017  . Multiple thyroid nodules 01/29/2017  . Chronic systolic heart failure ([place]) 10/08/2016  . Obstructive sleep apnea 10/08/2016  . Noncompliance 09/18/2016  . Cirrhosis ([place]) 09/18/2016   . Pulmonary hypertension ([place]) 05/30/2016  . Hypokalemia 05/30/2016  . NICM (nonischemic cardiomyopathy) ([place]) 05/30/2016  . Other ascites   . Benign hypertensive renal disease   . DM (diabetes mellitus), type 2 with renal complications ([place])   . Depression   . Bipolar 1 disorder ([place])   . Asthma   . Generalized abdominal pain 05/07/2016    Past Surgical History:  Procedure Laterality Date  . ABDOMINAL HYSTERECTOMY    . debribalator  2016  . INSERT / REPLACE / REMOVE PACEMAKER    . INSERTION OF ICD    . TONSILECTOMY, ADENOIDECTOMY, BILATERAL MYRINGOTOMY AND TUBES    . TONSILLECTOMY    . TONSILLECTOMY    . TUBAL LIGATION  1980  . TUBAL LIGATION      Prior to Admission medications   Medication Sig Start Date End Date Taking? Authorizing Provider  atorvastatin (LIPITOR) 40 MG tablet TAKE 1 TABLET BY MOUTH EVERY DAY--NEEDS APPT BEFORE RUN OUT 12/24/17   Minna Merritts, MD  blood glucose meter kit and supplies KIT Dispense based on patient and insurance preference. Use up to four times daily as directed. (FOR ICD-9 250.00, 250.01). 07/24/17   Park Liter P, DO  carvedilol (COREG) 6.25 MG tablet Take 1 tablet (6.25 mg total) by mouth 2 (two) times daily with a meal. 02/03/18   Gollan, Kathlene November, MD  dicyclomine (BENTYL) 20 MG tablet TAKE 1  TABLET (20 MG TOTAL) BY MOUTH 3 (THREE) TIMES DAILY AS NEEDED FOR SPASMS. 01/02/18   Johnson, Megan P, DO  furosemide (LASIX) 20 MG tablet Take 1 tablet (20 mg total) by mouth daily. 12/23/17   Minna Merritts, MD  Insulin Glargine (LANTUS SOLOSTAR) 100 UNIT/ML Solostar Pen Inject 40 Units into the skin daily at 10 pm. 07/15/17   Park Liter P, DO  Insulin Pen Needle 32G X 6 MM MISC 1 each by Does not apply route daily. 07/15/17   Johnson, Megan P, DO  lamoTRIgine (LAMICTAL) 25 MG tablet Take 1 tablet (25 mg total) by mouth 2 (two) times daily. 10/16/17   [name], MD  metoCLOPramide (REGLAN) 10 MG tablet Take 1 tablet (10 mg total) by  mouth 3 (three) times daily with meals. 02/09/18 02/09/19  Gregor Hams, MD  neomycin-polymyxin-hydrocortisone (CORTISPORIN) OTIC solution Place 3 drops into the right ear 4 (four) times daily. 09/12/17   Johnson, Megan P, DO  nitroGLYCERIN (NITROSTAT) 0.4 MG SL tablet Place 1 tablet (0.4 mg total) under the tongue every 5 (five) minutes as needed for chest pain. 06/04/16   Gladstone Lighter, MD  pantoprazole (PROTONIX) 40 MG tablet Take 1 tablet (40 mg total) by mouth daily. 08/28/17 08/28/18  [name], MD  PARoxetine (PAXIL) 30 MG tablet Take 1 tablet (30 mg total) by mouth daily. 09/16/17   [name], MD  potassium chloride SA (K-DUR,KLOR-CON) 20 MEQ tablet Take 1 tablet (20 mEq total) by mouth as directed. Take with fluid pill and as directed 12/09/17   Minna Merritts, MD  QUEtiapine (SEROQUEL) 300 MG tablet Take 1 tablet (300 mg total) by mouth at bedtime. 09/16/17   [name], MD  sacubitril-valsartan (ENTRESTO) 49-51 MG Take 1 tablet by mouth 2 (two) times daily. 11/22/17   Alisa Graff, FNP  spironolactone (ALDACTONE) 25 MG tablet TAKE 1 TABLET (25 MG TOTAL) BY MOUTH DAILY. 07/13/17   [provider]  traMADol (ULTRAM) 50 MG tablet Take 1 tablet (50 mg total) by mouth every 6 (six) hours as needed. 02/09/18 02/09/19  Gregor Hams, MD    Allergies Levothyroxine  Family History  Problem Relation Age of Onset  . Hypertension Mother   . Hyperlipidemia Mother   . Heart disease Mother   . Hypertension Sister   . Asthma Sister   . Heart disease Sister   . Diabetes Sister   . Cancer Sister   . Alzheimer's disease Maternal Grandfather   . Hyperlipidemia Brother   . Asthma Sister   . Hypertension Sister   . Diabetes Sister     Social History Social History   Tobacco Use  . Smoking status: Former Research scientist (life sciences)  . Smokeless tobacco: Never Used  Substance Use Topics  . Alcohol use: No  . Drug use: Yes    Types: Marijuana    Comment: 1/29 smoked today     Review of Systems Constitutional: No fever/chills Eyes: No visual changes. ENT: No sore throat. Cardiovascular: Positive for chest pain. Respiratory: Denies shortness of breath. Gastrointestinal: Positive for abdominal pain, nausea and vomiting. Positive for diarrhea.  Genitourinary: Negative for dysuria. Musculoskeletal: Negative for back pain. Skin: Negative for rash. Neurological: Negative for headaches, focal weakness or numbness.  ____________________________________________   PHYSICAL EXAM:  VITAL SIGNS: ED Triage Vitals  Enc Vitals Group     BP 02/14/18 1354 122/84     Pulse Rate 02/14/18 1354 (!) 104     Resp 02/14/18 1354 18  Temp 02/14/18 1354 98.4 F (36.9 C)     Temp Source 02/14/18 1354 Oral     SpO2 02/14/18 1354 97 %     Weight 02/14/18 1355 200 lb (90.7 kg)     Height 02/14/18 1355 _0  (1.651 m)     Head Circumference --      Peak Flow --      Pain Score 02/14/18 1355 10   Constitutional: Alert and oriented.  Eyes: Conjunctivae are normal.  ENT      Head: Normocephalic and atraumatic.      Nose: No congestion/rhinnorhea.      Mouth/Throat: Mucous membranes are moist.      Neck: No stridor. Hematological/Lymphatic/Immunilogical: No cervical lymphadenopathy. Cardiovascular: Normal rate, regular rhythm.  No murmurs, rubs, or gallops.  Respiratory: Normal respiratory effort without tachypnea nor retractions. Breath sounds are clear and equal bilaterally. No wheezes/rales/rhonchi. Gastrointestinal: Soft and somewhat diffusely tender, worse in the left abdomen. No rebound. No guarding.  Genitourinary: Deferred Musculoskeletal: Normal range of motion in all extremities. No lower extremity edema. Neurologic:  Normal speech and language. No gross focal neurologic deficits are appreciated.  Skin:  Skin is warm, dry and intact. No rash noted. Psychiatric: Mood and affect are normal. Speech and behavior are normal. Patient exhibits appropriate insight  and judgment.  ____________________________________________    LABS (pertinent positives/negatives)  BMP na 138, k 3.0, glu 181, cr 1.39 CBC wbc 8.5, hgb 13.5, plt 279 Trop 0.03 Lipase 34 ____________________________________________   EKG  I, Nance Pear, attending physician, personally viewed and interpreted this EKG  EKG Time: 1344 Rate: 101 Rhythm: sinus tachycardia Axis: normal Intervals: qtc 469 QRS: narrow, q waves v1 ST changes: no st elevation Impression: abnormal ekg   ____________________________________________    RADIOLOGY  CXR No pneumonia, no pneumothorax  ____________________________________________   PROCEDURES  Procedures  ____________________________________________   INITIAL IMPRESSION / ASSESSMENT AND PLAN / ED COURSE  Pertinent labs & imaging results that were available during my care of the patient were reviewed by me and considered in my medical decision making (see chart for details).   Patient presented to the emergency department today with continued nausea vomiting diarrhea and stomach discomfort.  Patient was seen for the symptoms roughly 1 week ago with negative CT scan.  Work-up today shows persistent hypokalemia.  Patient states she is on potassium.  Right upper quadrant ultrasound was obtained which did not show any stones.  At this point given negative CT and negative right upper quadrant I do think gastritis is likely.  Discussed this with the patient.  She at one time had been on antiacids but does not think she is currently on any antiacid medication.  Will restart antacid medication.  Additionally will give sucralfate and information about what foods to avoid. Will give GI f/u information   ____________________________________________   FINAL CLINICAL IMPRESSION(S) / ED DIAGNOSES  Final diagnoses:  Nausea and vomiting  Gastritis, presence of bleeding unspecified, unspecified chronicity, unspecified gastritis type      Note: This dictation was prepared with Dragon dictation. Any transcriptional errors that result from this process are unintentional     Nance Pear, MD 02/14/18 2120

## 2018-02-14 NOTE — ED Notes (Signed)
Pt reports the dizziness has subsided and she no longer feels nauseous. Pt able to keep crackers down and able to keep medications down. Pt reports feeling comfortable being discharged home.

## 2018-02-14 NOTE — ED Notes (Signed)
Date and time results received: 02/14/18 2:41 PM (use smartphrase ".now" to insert current time)  Test: Troponin Critical Value: 0.03  Name of Provider Notified: Dr. Don Perking  Orders Received? Or Actions Taken?: Acknowledged

## 2018-02-19 ENCOUNTER — Ambulatory Visit (INDEPENDENT_AMBULATORY_CARE_PROVIDER_SITE_OTHER): Payer: Medicare Other | Admitting: Family Medicine

## 2018-02-19 ENCOUNTER — Ambulatory Visit (INDEPENDENT_AMBULATORY_CARE_PROVIDER_SITE_OTHER): Payer: Medicare Other

## 2018-02-19 ENCOUNTER — Encounter: Payer: Self-pay | Admitting: Family Medicine

## 2018-02-19 VITALS — BP 118/74 | HR 82 | Temp 98.1°F | Ht 65.0 in | Wt 202.6 lb

## 2018-02-19 VITALS — BP 118/74 | HR 82 | Temp 98.1°F | Resp 16 | Ht 65.0 in | Wt 202.9 lb

## 2018-02-19 DIAGNOSIS — M65321 Trigger finger, right index finger: Secondary | ICD-10-CM | POA: Diagnosis not present

## 2018-02-19 DIAGNOSIS — Z0000 Encounter for general adult medical examination without abnormal findings: Secondary | ICD-10-CM | POA: Diagnosis not present

## 2018-02-19 DIAGNOSIS — I272 Pulmonary hypertension, unspecified: Secondary | ICD-10-CM | POA: Diagnosis not present

## 2018-02-19 DIAGNOSIS — F410 Panic disorder [episodic paroxysmal anxiety] without agoraphobia: Secondary | ICD-10-CM

## 2018-02-19 DIAGNOSIS — E876 Hypokalemia: Secondary | ICD-10-CM | POA: Diagnosis not present

## 2018-02-19 DIAGNOSIS — R197 Diarrhea, unspecified: Secondary | ICD-10-CM

## 2018-02-19 DIAGNOSIS — I5022 Chronic systolic (congestive) heart failure: Secondary | ICD-10-CM | POA: Diagnosis not present

## 2018-02-19 DIAGNOSIS — F319 Bipolar disorder, unspecified: Secondary | ICD-10-CM

## 2018-02-19 DIAGNOSIS — F329 Major depressive disorder, single episode, unspecified: Secondary | ICD-10-CM | POA: Diagnosis not present

## 2018-02-19 DIAGNOSIS — N183 Chronic kidney disease, stage 3 unspecified: Secondary | ICD-10-CM

## 2018-02-19 DIAGNOSIS — Z794 Long term (current) use of insulin: Secondary | ICD-10-CM

## 2018-02-19 DIAGNOSIS — K7031 Alcoholic cirrhosis of liver with ascites: Secondary | ICD-10-CM

## 2018-02-19 DIAGNOSIS — I129 Hypertensive chronic kidney disease with stage 1 through stage 4 chronic kidney disease, or unspecified chronic kidney disease: Secondary | ICD-10-CM | POA: Diagnosis not present

## 2018-02-19 DIAGNOSIS — E039 Hypothyroidism, unspecified: Secondary | ICD-10-CM | POA: Diagnosis not present

## 2018-02-19 DIAGNOSIS — E042 Nontoxic multinodular goiter: Secondary | ICD-10-CM

## 2018-02-19 DIAGNOSIS — F3130 Bipolar disorder, current episode depressed, mild or moderate severity, unspecified: Secondary | ICD-10-CM

## 2018-02-19 DIAGNOSIS — E1122 Type 2 diabetes mellitus with diabetic chronic kidney disease: Secondary | ICD-10-CM | POA: Diagnosis not present

## 2018-02-19 DIAGNOSIS — F4312 Post-traumatic stress disorder, chronic: Secondary | ICD-10-CM

## 2018-02-19 DIAGNOSIS — I428 Other cardiomyopathies: Secondary | ICD-10-CM

## 2018-02-19 LAB — UA/M W/RFLX CULTURE, ROUTINE
Bilirubin, UA: NEGATIVE
Glucose, UA: NEGATIVE
KETONES UA: NEGATIVE
Leukocytes, UA: NEGATIVE
Nitrite, UA: NEGATIVE
PH UA: 6 (ref 5.0–7.5)
RBC, UA: NEGATIVE
Specific Gravity, UA: 1.015 (ref 1.005–1.030)
Urobilinogen, Ur: 0.2 mg/dL (ref 0.2–1.0)

## 2018-02-19 LAB — MICROSCOPIC EXAMINATION

## 2018-02-19 LAB — MICROALBUMIN, URINE WAIVED
Creatinine, Urine Waived: 300 mg/dL (ref 10–300)
MICROALB, UR WAIVED: 150 mg/L — AB (ref 0–19)

## 2018-02-19 LAB — BAYER DCA HB A1C WAIVED: HB A1C (BAYER DCA - WAIVED): 7.8 % — ABNORMAL HIGH (ref <7.0)

## 2018-02-19 LAB — HEMOGLOBIN A1C: HEMOGLOBIN A1C: 7.8

## 2018-02-19 MED ORDER — QUETIAPINE FUMARATE 300 MG PO TABS: 300.0000 mg | ORAL_TABLET | Freq: Every day | ORAL | 1 refills | Status: DC

## 2018-02-19 MED ORDER — INSULIN GLARGINE 100 UNIT/ML SOLOSTAR PEN: 40.0000 [IU] | PEN_INJECTOR | Freq: Every day | SUBCUTANEOUS | 12 refills | Status: DC

## 2018-02-19 MED ORDER — PAROXETINE HCL 30 MG PO TABS: 30.0000 mg | ORAL_TABLET | Freq: Every day | ORAL | 1 refills | Status: DC

## 2018-02-19 NOTE — Assessment & Plan Note (Signed)
Not under good control. Has not been taking her medicine. Restart seroquel. 1 month supply given until she gets into see psychiatry. Appointment with psychiatry scheduled today. Patient and her wife aware of appointment.

## 2018-02-19 NOTE — Patient Instructions (Addendum)
[facility] [place] [address] [place], [place] [address]  Main: [phone]  Dr. Maximino Greenland March 06, 2018 1:15PM   [facility] [address], [place], [place] [address]  Phone: [phone] March 25, 2018 @ 1:00pm Dr. Elna Breslow

## 2018-02-19 NOTE — Assessment & Plan Note (Signed)
Euvolemic today. Call with any concerns. Continue to follow with cardiology. Continue current regimen.

## 2018-02-19 NOTE — Assessment & Plan Note (Signed)
Will keep BP and cholesterol under good control. Continue to monitor. Call with any concerns.  

## 2018-02-19 NOTE — Assessment & Plan Note (Signed)
Doing much better with A1c of 7.8- not under good control. Rechecking CMP today. Await results. Would like to go back on oral meds, will depend on her kidney function.

## 2018-02-19 NOTE — Assessment & Plan Note (Signed)
Under good control.  Continue to monitor. Call with any concerns. Continue current regimen.  

## 2018-02-19 NOTE — Assessment & Plan Note (Signed)
No swelling. Rechecking levels today. Will get her into see GI- appointment scheduled. Call with any concerns.

## 2018-02-19 NOTE — Assessment & Plan Note (Signed)
Rechecking levels today. Await results. Call with any concerns.  

## 2018-02-19 NOTE — Patient Instructions (Signed)
Bonnie Ochoa , Thank you for taking time to come for your Medicare Wellness Visit. I appreciate your ongoing commitment to your health goals. Please review the following plan we discussed and let me know if I can assist you in the future.   Screening recommendations/referrals: Colonoscopy: someone will call to schedule this. Mammogram: Please call [phone] to schedule your mammogram.  Bone Density: due at age 52  Recommended yearly ophthalmology/optometry visit for glaucoma screening and checkup Recommended yearly dental visit for hygiene and checkup  Vaccinations: Influenza vaccine: due 04/2018 Pneumococcal vaccine: due again at age 104 Tdap vaccine: due, check with your insurance company for coverage  Shingles vaccine: shingrix eligibl,e check with your insurance company for coverage   Advanced directives: Advance directive discussed with you today. I have provided a copy for you to complete at home and have notarized. Once this is complete please bring a copy in to our office so we can scan it into your chart.  Conditions/risks identified: Recommend drinking at least 6-8 glasses of water a day   Next appointment: Follow up in one year for your annual wellness exam.   Preventive Care 40-64 Years, Female Preventive care refers to lifestyle choices and visits with your health care provider that can promote health and wellness. What does preventive care include?  A yearly physical exam. This is also called an annual well check.  Dental exams once or twice a year.  Routine eye exams. Ask your health care provider how often you should have your eyes checked.  Personal lifestyle choices, including:  Daily care of your teeth and gums.  Regular physical activity.  Eating a healthy diet.  Avoiding tobacco and drug use.  Limiting alcohol use.  Practicing safe sex.  Taking low-dose aspirin daily starting at age 31.  Taking vitamin and mineral supplements as recommended by your  health care provider. What happens during an annual well check? The services and screenings done by your health care provider during your annual well check will depend on your age, overall health, lifestyle risk factors, and family history of disease. Counseling  Your health care provider may ask you questions about your:  Alcohol use.  Tobacco use.  Drug use.  Emotional well-being.  Home and relationship well-being.  Sexual activity.  Eating habits.  Work and work Statistician.  Method of birth control.  Menstrual cycle.  Pregnancy history. Screening  You may have the following tests or measurements:  Height, weight, and BMI.  Blood pressure.  Lipid and cholesterol levels. These may be checked every 5 years, or more frequently if you are over 25 years old.  Skin check.  Lung cancer screening. You may have this screening every year starting at age 33 if you have a 30-pack-year history of smoking and currently smoke or have quit within the past 15 years.  Fecal occult blood test (FOBT) of the stool. You may have this test every year starting at age 65.  Flexible sigmoidoscopy or colonoscopy. You may have a sigmoidoscopy every 5 years or a colonoscopy every 10 years starting at age 87.  Hepatitis C blood test.  Hepatitis B blood test.  Sexually transmitted disease (STD) testing.  Diabetes screening. This is done by checking your blood sugar (glucose) after you have not eaten for a while (fasting). You may have this done every 1-3 years.  Mammogram. This may be done every 1-2 years. Talk to your health care provider about when you should start having regular mammograms. This may depend  on whether you have a family history of breast cancer.  BRCA-related cancer screening. This may be done if you have a family history of breast, ovarian, tubal, or peritoneal cancers.  Pelvic exam and Pap test. This may be done every 3 years starting at age 29. Starting at age 10,  this may be done every 5 years if you have a Pap test in combination with an HPV test.  Bone density scan. This is done to screen for osteoporosis. You may have this scan if you are at high risk for osteoporosis. Discuss your test results, treatment options, and if necessary, the need for more tests with your health care provider. Vaccines  Your health care provider may recommend certain vaccines, such as:  Influenza vaccine. This is recommended every year.  Tetanus, diphtheria, and acellular pertussis (Tdap, Td) vaccine. You may need a Td booster every 10 years.  Zoster vaccine. You may need this after age 39.  Pneumococcal 13-valent conjugate (PCV13) vaccine. You may need this if you have certain conditions and were not previously vaccinated.  Pneumococcal polysaccharide (PPSV23) vaccine. You may need one or two doses if you smoke cigarettes or if you have certain conditions. Talk to your health care provider about which screenings and vaccines you need and how often you need them. This information is not intended to replace advice given to you by your health care provider. Make sure you discuss any questions you have with your health care provider. Document Released: 08/26/2015 Document Revised: 04/18/2016 Document Reviewed: 05/31/2015 Elsevier Interactive Patient Education  2017 [place] Prevention in the Home Falls can cause injuries. They can happen to people of all ages. There are many things you can do to make your home safe and to help prevent falls. What can I do on the outside of my home?  Regularly fix the edges of walkways and driveways and fix any cracks.  Remove anything that might make you trip as you walk through a door, such as a raised step or threshold.  Trim any bushes or trees on the path to your home.  Use bright outdoor lighting.  Clear any walking paths of anything that might make someone trip, such as rocks or tools.  Regularly check to see  if handrails are loose or broken. Make sure that both sides of any steps have handrails.  Any raised decks and porches should have guardrails on the edges.  Have any leaves, snow, or ice cleared regularly.  Use sand or salt on walking paths during winter.  Clean up any spills in your garage right away. This includes oil or grease spills. What can I do in the bathroom?  Use night lights.  Install grab bars by the toilet and in the tub and shower. Do not use towel bars as grab bars.  Use non-skid mats or decals in the tub or shower.  If you need to sit down in the shower, use a plastic, non-slip stool.  Keep the floor dry. Clean up any water that spills on the floor as soon as it happens.  Remove soap buildup in the tub or shower regularly.  Attach bath mats securely with double-sided non-slip rug tape.  Do not have throw rugs and other things on the floor that can make you trip. What can I do in the bedroom?  Use night lights.  Make sure that you have a light by your bed that is easy to reach.  Do not use any  sheets or blankets that are too big for your bed. They should not hang down onto the floor.  Have a firm chair that has side arms. You can use this for support while you get dressed.  Do not have throw rugs and other things on the floor that can make you trip. What can I do in the kitchen?  Clean up any spills right away.  Avoid walking on wet floors.  Keep items that you use a lot in easy-to-reach places.  If you need to reach something above you, use a strong step stool that has a grab bar.  Keep electrical cords out of the way.  Do not use floor polish or wax that makes floors slippery. If you must use wax, use non-skid floor wax.  Do not have throw rugs and other things on the floor that can make you trip. What can I do with my stairs?  Do not leave any items on the stairs.  Make sure that there are handrails on both sides of the stairs and use them.  Fix handrails that are broken or loose. Make sure that handrails are as long as the stairways.  Check any carpeting to make sure that it is firmly attached to the stairs. Fix any carpet that is loose or worn.  Avoid having throw rugs at the top or bottom of the stairs. If you do have throw rugs, attach them to the floor with carpet tape.  Make sure that you have a light switch at the top of the stairs and the bottom of the stairs. If you do not have them, ask someone to add them for you. What else can I do to help prevent falls?  Wear shoes that:  Do not have high heels.  Have rubber bottoms.  Are comfortable and fit you well.  Are closed at the toe. Do not wear sandals.  If you use a stepladder:  Make sure that it is fully opened. Do not climb a closed stepladder.  Make sure that both sides of the stepladder are locked into place.  Ask someone to hold it for you, if possible.  Clearly mark and make sure that you can see:  Any grab bars or handrails.  First and last steps.  Where the edge of each step is.  Use tools that help you move around (mobility aids) if they are needed. These include:  Canes.  Walkers.  Scooters.  Crutches.  Turn on the lights when you go into a dark area. Replace any light bulbs as soon as they burn out.  Set up your furniture so you have a clear path. Avoid moving your furniture around.  If any of your floors are uneven, fix them.  If there are any pets around you, be aware of where they are.  Review your medicines with your doctor. Some medicines can make you feel dizzy. This can increase your chance of falling. Ask your doctor what other things that you can do to help prevent falls. This information is not intended to replace advice given to you by your health care provider. Make sure you discuss any questions you have with your health care provider. Document Released: 05/26/2009 Document Revised: 01/05/2016 Document Reviewed:  09/03/2014 Elsevier Interactive Patient Education  2017 [place] provider would like to you have your annual eye exam. Please contact your current eye doctor or here are some good options for you to contact.   [facility]  [facility] Address: [address] [place], [place] [address]   Address: [address] [place], [place] [address]  Phone: [phone]      Phone: [phone]  Website: [email or web address]    Website: [email or web address]     [facility]  Address: [place], [place], [place] [address]   Address: [place], [place], [place] [address] Phone: [phone]      Phone: [phone]    [facility] Address: [place], [place], [place] [address]  Phone: [phone]

## 2018-02-19 NOTE — Progress Notes (Signed)
BP 118/74 (BP Location: Left Arm, Patient Position: Sitting, Cuff Size: Normal)   Pulse 82   Temp 98.1 F (36.7 C)   Ht 5\' 5"  (1.651 m)   Wt 202 lb 9 oz (91.9 kg)   SpO2 98%   BMI 33.71 kg/m    Subjective:    Patient ID: Bonnie Ochoa, female    DOB: 30-Jun-1966, 52 y.o.   MRN: [id number]  HPI: Bonnie Ochoa is a 52 y.o. female  Chief Complaint  Patient presents with  . Depression  . Diabetes  . Hyperlipidemia  . Hypertension  . Hypothyroidism   ABDOMINAL ISSUES Duration: 2 weeks Nature: bloating, sharp and dull Location: diffuse  Severity: moderate  Radiation: no Episode duration: hours Frequency: intermittent Treatments attempted: reglan, zofran, crafate Constipation: no Diarrhea: yes Episodes of diarrhea/day: at least 3x a day Mucous in the stool: no Heartburn: yes Bloating:yes Flatulence: yes Nausea: yes Vomiting: yes Episodes of vomit/day: last time on Saturday Melena or hematochezia: no Rash: no Jaundice: no Fever: no Weight loss: no  DIABETES Hypoglycemic episodes:no Polydipsia/polyuria: no Visual disturbance: no Chest pain: no Paresthesias: no Glucose Monitoring: yes  Accucheck frequency: Daily  Fasting glucose: 180s Taking Insulin?: yes Blood Pressure Monitoring: not checking Retinal Examination: Not up to Date Foot Exam: Up to Date Diabetic Education: Not Completed Pneumovax: Up to Date Influenza: Up to Date Aspirin: no  HYPERTENSION / HYPERLIPIDEMIA Satisfied with current treatment? yes Duration of hypertension: chronic BP monitoring frequency: not checking BP range:  BP medication side effects: no Past BP meds: entresto, spironalactone, lasix, carvedilol Duration of hyperlipidemia: chronic Cholesterol medication side effects: no Cholesterol supplements: none Past cholesterol medications: atorvastatin Medication compliance: fair compliance Aspirin: no Recent stressors: no Recurrent headaches: no Visual changes:  no Palpitations: no Dyspnea: no Chest pain: no Lower extremity edema: no Dizzy/lightheaded: no  HYPOTHYROIDISM Thyroid control status:stable Satisfied with current treatment? yes Medication side effects: Not on anything Recent dose adjustment:no Fatigue: yes Cold intolerance: no Heat intolerance: no Weight gain: yes Weight loss: no Constipation: no Diarrhea/loose stools: no Palpitations: no Lower extremity edema: no Anxiety/depressed mood: yes   Mood has been doing OK. Has not seen psychiatry- missed the last appointment. Out of her seroquel. Depression screen [facility] 2/9 02/19/2018 12/24/2017 09/10/2017 07/15/2017 04/29/2017  Decreased Interest 2 0 0 0 0  Down, Depressed, Hopeless 0 0 0 0 0  PHQ - 2 Score 2 0 0 0 0  Altered sleeping 0 - - 1 -  Tired, decreased energy 2 - - 3 -  Change in appetite 2 - - 2 -  Feeling bad or failure about yourself  0 - - 0 -  Trouble concentrating 1 - - 1 -  Moving slowly or fidgety/restless 0 - - 0 -  Suicidal thoughts 0 - - 0 -  PHQ-9 Score 7 - - 7 -  Difficult doing work/chores Not difficult at all - - Very difficult -     Relevant past medical, surgical, family and social history reviewed and updated as indicated. Interim medical history since our last visit reviewed. Allergies and medications reviewed and updated.  Review of Systems  Constitutional: Negative.   Respiratory: Negative.   Cardiovascular: Negative.   Gastrointestinal: Positive for abdominal pain, diarrhea, nausea and vomiting. Negative for abdominal distention, anal bleeding, blood in stool, constipation and rectal pain.  Musculoskeletal: Positive for back pain and myalgias. Negative for arthralgias, gait problem, joint swelling, neck pain and neck stiffness.  Skin: Negative.   Neurological: Negative.  Psychiatric/Behavioral: Positive for dysphoric mood and sleep disturbance. Negative for agitation, behavioral problems, confusion, decreased concentration, hallucinations,  self-injury and suicidal ideas. The patient is nervous/anxious. The patient is not hyperactive.     Per HPI unless specifically indicated above     Objective:    BP 118/74 (BP Location: Left Arm, Patient Position: Sitting, Cuff Size: Normal)   Pulse 82   Temp 98.1 F (36.7 C)   Ht 5\' 5"  (1.651 m)   Wt 202 lb 9 oz (91.9 kg)   SpO2 98%   BMI 33.71 kg/m   Wt Readings from Last 3 Encounters:  02/19/18 202 lb 9 oz (91.9 kg)  02/19/18 202 lb 14.4 oz (92 kg)  02/14/18 200 lb (90.7 kg)    Physical Exam  Constitutional: She is oriented to person, place, and time. She appears well-developed and well-nourished. No distress.  HENT:  Head: Normocephalic and atraumatic.  Right Ear: Hearing normal.  Left Ear: Hearing normal.  Nose: Nose normal.  Eyes: Conjunctivae and lids are normal. Right eye exhibits no discharge. Left eye exhibits no discharge. No scleral icterus.  Cardiovascular: Normal rate, regular rhythm, normal heart sounds and intact distal pulses. Exam reveals no gallop and no friction rub.  No murmur heard. Pulmonary/Chest: Effort normal and breath sounds normal. No stridor. No respiratory distress. She has no wheezes. She exhibits no tenderness.  Abdominal: Soft. Bowel sounds are normal. She exhibits no distension and no mass. There is no tenderness. There is no rebound and no guarding. No hernia.  Musculoskeletal: Normal range of motion. She exhibits no edema, tenderness or deformity.  Neurological: She is alert and oriented to person, place, and time.  Skin: Skin is warm, dry and intact. Capillary refill takes less than 2 seconds. No rash noted. She is not diaphoretic. No erythema. No pallor.  Psychiatric: She has a normal mood and affect. Her speech is normal and behavior is normal. Judgment and thought content normal. Cognition and memory are normal.    Results for orders placed or performed in visit on 02/19/18  Microscopic Examination  Result Value Ref Range   WBC, UA  0-5 0 - 5 /hpf   RBC, UA 0-2 0 - 2 /hpf   Epithelial Cells (non renal) 0-10 0 - 10 /hpf   Casts Present None seen /lpf   Cast Type Hyaline casts N/A   Bacteria, UA Few None seen/Few  Bayer DCA Hb A1c Waived  Result Value Ref Range   HB A1C (BAYER DCA - WAIVED) 7.8 (H) <7.0 %  Microalbumin, Urine Waived  Result Value Ref Range   Microalb, Ur Waived 150 (H) 0 - 19 mg/L   Creatinine, Urine Waived 300 10 - 300 mg/dL   Microalb/Creat Ratio 30-300 (H) <30 mg/g  UA/M w/rflx Culture, Routine  Result Value Ref Range   Specific Gravity, UA 1.015 1.005 - 1.030   pH, UA 6.0 5.0 - 7.5   Color, UA Orange Yellow   Appearance Ur Hazy (A) Clear   Leukocytes, UA Negative Negative   Protein, UA 2+ (A) Negative/Trace   Glucose, UA Negative Negative   Ketones, UA Negative Negative   RBC, UA Negative Negative   Bilirubin, UA Negative Negative   Urobilinogen, Ur 0.2 0.2 - 1.0 mg/dL   Nitrite, UA Negative Negative   Microscopic Examination See below:       Assessment & Plan:   Problem List Items Addressed This Visit      Cardiovascular and Mediastinum   Chronic systolic  heart failure (HCC) (Chronic)    Euvolemic today. Call with any concerns. Continue to follow with cardiology. Continue current regimen.       Relevant Orders   CBC with Differential/Platelet   Comprehensive metabolic panel   Lipid Panel w/o Chol/HDL Ratio   Microalbumin, Urine Waived (Completed)   TSH   UA/M w/rflx Culture, Routine (Completed)   Pulmonary hypertension (HCC)    Will keep BP and cholesterol under good control. Continue to monitor. Call with any concerns.       Relevant Orders   CBC with Differential/Platelet   Comprehensive metabolic panel   Lipid Panel w/o Chol/HDL Ratio   Microalbumin, Urine Waived (Completed)   TSH   UA/M w/rflx Culture, Routine (Completed)   NICM (nonischemic cardiomyopathy) (HCC)    Will keep BP and cholesterol under good control. Continue to monitor. Call with any concerns.           Digestive   Cirrhosis (HCC)    No swelling. Rechecking levels today. Will get her into see GI- appointment scheduled. Call with any concerns.       Relevant Orders   CBC with Differential/Platelet   Comprehensive metabolic panel   Lipid Panel w/o Chol/HDL Ratio   Microalbumin, Urine Waived (Completed)   TSH   UA/M w/rflx Culture, Routine (Completed)   Ambulatory referral to Gastroenterology     Endocrine   DM (diabetes mellitus), type 2 with renal complications (HCC) - Primary    Doing much better with A1c of 7.8- not under good control. Rechecking CMP today. Await results. Would like to go back on oral meds, will depend on her kidney function.       Relevant Medications   Insulin Glargine (LANTUS SOLOSTAR) 100 UNIT/ML Solostar Pen   Other Relevant Orders   Bayer DCA Hb A1c Waived (Completed)   CBC with Differential/Platelet   Comprehensive metabolic panel   Lipid Panel w/o Chol/HDL Ratio   Microalbumin, Urine Waived (Completed)   TSH   UA/M w/rflx Culture, Routine (Completed)   Multiple thyroid nodules    Has not seen endocrine. Will recheck thyroid levels and will get her back into see endocrine after she has seen other specialists.       Relevant Orders   CBC with Differential/Platelet   Comprehensive metabolic panel   Lipid Panel w/o Chol/HDL Ratio   Microalbumin, Urine Waived (Completed)   TSH   UA/M w/rflx Culture, Routine (Completed)   Hypothyroid    Has not seen endocrine. Will recheck thyroid levels and will get her back into see endocrine after she has seen other specialists.       Relevant Orders   CBC with Differential/Platelet   Comprehensive metabolic panel   Lipid Panel w/o Chol/HDL Ratio   Microalbumin, Urine Waived (Completed)   TSH   UA/M w/rflx Culture, Routine (Completed)     Genitourinary   Benign hypertensive renal disease    Under good control. Continue to monitor. Call with any concerns. Continue current regimen.       Relevant  Orders   CBC with Differential/Platelet   Comprehensive metabolic panel   Lipid Panel w/o Chol/HDL Ratio   Microalbumin, Urine Waived (Completed)   TSH   UA/M w/rflx Culture, Routine (Completed)     Other   Depression    Not under good control. Has not been taking her medicine. Restart seroquel. 1 month supply given until she gets into see psychiatry. Appointment with psychiatry scheduled today. Patient and her wife aware of  appointment.       Relevant Medications   PARoxetine (PAXIL) 30 MG tablet   Other Relevant Orders   CBC with Differential/Platelet   Comprehensive metabolic panel   Lipid Panel w/o Chol/HDL Ratio   Microalbumin, Urine Waived (Completed)   TSH   UA/M w/rflx Culture, Routine (Completed)   Bipolar 1 disorder (HCC)    Not under good control. Has not been taking her medicine. Restart seroquel. 1 month supply given until she gets into see psychiatry. Appointment with psychiatry scheduled today. Patient and her wife aware of appointment.       Relevant Orders   CBC with Differential/Platelet   Comprehensive metabolic panel   Lipid Panel w/o Chol/HDL Ratio   Microalbumin, Urine Waived (Completed)   TSH   UA/M w/rflx Culture, Routine (Completed)   Hypokalemia    Rechecking levels today. Await results. Call with any concerns.       Relevant Orders   CBC with Differential/Platelet   Comprehensive metabolic panel   Lipid Panel w/o Chol/HDL Ratio   Microalbumin, Urine Waived (Completed)   TSH   UA/M w/rflx Culture, Routine (Completed)    Other Visit Diagnoses    Diarrhea, unspecified type       To see GI on 7/25. Appointment made and Yvette and her wife aware. Will check Stool studies ordered. Will await results.    Relevant Orders   Ambulatory referral to Gastroenterology   Stool C-Diff Toxin Assay   Ova and parasite examination   Stool Culture   Fecal leukocytes   Trigger index finger of right hand       Will get her into hand surgeon, but will hold  off until after she sees other specialists.    Bipolar I disorder, most recent episode depressed (HCC)       Relevant Medications   QUEtiapine (SEROQUEL) 300 MG tablet   Chronic post-traumatic stress disorder (PTSD)       Relevant Medications   QUEtiapine (SEROQUEL) 300 MG tablet   PARoxetine (PAXIL) 30 MG tablet   Panic disorder       Relevant Medications   PARoxetine (PAXIL) 30 MG tablet       Follow up plan: Return After August 14th.

## 2018-02-19 NOTE — Assessment & Plan Note (Signed)
Has not seen endocrine. Will recheck thyroid levels and will get her back into see endocrine after she has seen other specialists.

## 2018-02-19 NOTE — Assessment & Plan Note (Signed)
Has not seen endocrine. Will recheck thyroid levels and will get her back into see endocrine after she has seen other specialists.  

## 2018-02-19 NOTE — Assessment & Plan Note (Signed)
Not under good control. Has not been taking her medicine. Restart seroquel. 1 month supply given until she gets into see psychiatry. Appointment with psychiatry scheduled today. Patient and her wife aware of appointment.  

## 2018-02-19 NOTE — Progress Notes (Signed)
Subjective:   Bonnie Ochoa is a 52 y.o. female who presents for an Initial Medicare Annual Wellness Visit.  Review of Systems    Cardiac Risk Factors include: diabetes mellitus;dyslipidemia;hypertension;obesity (BMI >30kg/m2);smoking/ tobacco exposure     Objective:    Today's Vitals   02/19/18 1034 02/19/18 1039  BP: 118/74   Pulse: 82   Resp: 16   Temp: 98.1 F (36.7 C)   TempSrc: Temporal   SpO2: 98%   Weight: 202 lb 14.4 oz (92 kg)   Height: _0  (1.651 m)   PainSc:  0-No pain   Body mass index is 33.76 kg/m.  Advanced Directives 02/19/2018 09/14/2017 07/03/2017 07/03/2017 07/02/2017 04/29/2017 03/11/2017  Does Patient Have a Medical Advance Directive? _1  No No  Would patient like information on creating a medical advance directive? Yes (MAU/Ambulatory/Procedural Areas - Information given) No - Patient declined Yes (Inpatient - patient requests chaplain consult to create a medical advance directive) No - Patient declined No - Patient declined - -    Current Medications (verified) Outpatient Encounter Medications as of 02/19/2018  Medication Sig  . atorvastatin (LIPITOR) 40 MG tablet TAKE 1 TABLET BY MOUTH EVERY DAY--NEEDS APPT BEFORE RUN OUT  . blood glucose meter kit and supplies KIT Dispense based on patient and insurance preference. Use up to four times daily as directed. (FOR ICD-9 250.00, 250.01).  . carvedilol (COREG) 6.25 MG tablet Take 1 tablet (6.25 mg total) by mouth 2 (two) times daily with a meal.  . dicyclomine (BENTYL) 20 MG tablet TAKE 1 TABLET (20 MG TOTAL) BY MOUTH 3 (THREE) TIMES DAILY AS NEEDED FOR SPASMS.  . famotidine (PEPCID) 40 MG tablet Take 1 tablet (40 mg total) by mouth every evening.  . furosemide (LASIX) 20 MG tablet Take 1 tablet (20 mg total) by mouth daily.  . Insulin Glargine (LANTUS SOLOSTAR) 100 UNIT/ML Solostar Pen Inject 40 Units into the skin daily at 10 pm.  . Insulin Pen Needle 32G X 6 MM MISC 1 each by Does not apply  route daily.  Marland Kitchen lamoTRIgine (LAMICTAL) 25 MG tablet Take 1 tablet (25 mg total) by mouth 2 (two) times daily.  . metoCLOPramide (REGLAN) 10 MG tablet Take 1 tablet (10 mg total) by mouth 3 (three) times daily with meals.  . ondansetron (ZOFRAN ODT) 4 MG disintegrating tablet Take 1 tablet (4 mg total) by mouth every 8 (eight) hours as needed for nausea or vomiting.  . pantoprazole (PROTONIX) 40 MG tablet Take 1 tablet (40 mg total) by mouth daily.  Marland Kitchen PARoxetine (PAXIL) 30 MG tablet Take 1 tablet (30 mg total) by mouth daily.  . potassium chloride SA (K-DUR,KLOR-CON) 20 MEQ tablet Take 1 tablet (20 mEq total) by mouth as directed. Take with fluid pill and as directed  . sacubitril-valsartan (ENTRESTO) 49-51 MG Take 1 tablet by mouth 2 (two) times daily.  Marland Kitchen spironolactone (ALDACTONE) 25 MG tablet TAKE 1 TABLET (25 MG TOTAL) BY MOUTH DAILY.  Marland Kitchen sucralfate (CARAFATE) 1 g tablet Take 1 tablet (1 g total) by mouth 4 (four) times daily.  . traMADol (ULTRAM) 50 MG tablet Take 1 tablet (50 mg total) by mouth every 6 (six) hours as needed.  . neomycin-polymyxin-hydrocortisone (CORTISPORIN) OTIC solution Place 3 drops into the right ear 4 (four) times daily. (Patient not taking: Reported on 02/19/2018)  . nitroGLYCERIN (NITROSTAT) 0.4 MG SL tablet Place 1 tablet (0.4 mg total) under the tongue every 5 (five) minutes as needed for chest  pain. (Patient not taking: Reported on 02/19/2018)  . QUEtiapine (SEROQUEL) 300 MG tablet Take 1 tablet (300 mg total) by mouth at bedtime. (Patient not taking: Reported on 02/19/2018)   No facility-administered encounter medications on file as of 02/19/2018.     Allergies (verified) Levothyroxine   History: Past Medical History:  Diagnosis Date  . ADHD   . Arthritis   . Asthma   . Bipolar 1 disorder ([place])   . CHF (congestive heart failure) ([place])   . Cirrhosis of liver ([place])   . Coronary artery disease   . Depression   . Diabetes mellitus without complication ([place])     . Diverticulitis   . Hypertension   . IBS (irritable bowel syndrome)   . Insomnia   . Migraines   . PTSD (post-traumatic stress disorder)   . Restless leg syndrome   . Sleep apnea   . Vertigo    Past Surgical History:  Procedure Laterality Date  . ABDOMINAL HYSTERECTOMY    . debribalator  2016  . INSERT / REPLACE / REMOVE PACEMAKER    . INSERTION OF ICD    . TONSILECTOMY, ADENOIDECTOMY, BILATERAL MYRINGOTOMY AND TUBES    . TONSILLECTOMY    . TONSILLECTOMY    . TUBAL LIGATION  1980  . TUBAL LIGATION     Family History  Problem Relation Age of Onset  . Hypertension Mother   . Hyperlipidemia Mother   . Heart disease Mother   . Hypertension Sister   . Asthma Sister   . Heart disease Sister   . Diabetes Sister   . Cancer Sister   . Alzheimer's disease Maternal Grandfather   . Hyperlipidemia Brother   . Asthma Sister   . Hypertension Sister   . Diabetes Sister    Social History   Socioeconomic History  . Marital status: Married    Spouse name: Not on file  . Number of children: 2  . Years of education: Not on file  . Highest education level: High school graduate  Occupational History    Comment: disablie   Social Needs  . Financial resource strain: Not hard at all  . Food insecurity:    Worry: Sometimes true    Inability: Sometimes true  . Transportation needs:    Medical: Yes    Non-medical: Yes  Tobacco Use  . Smoking status: Former Smoker    Types: Cigarettes  . Smokeless tobacco: Never Used  . Tobacco comment: quit about 20 years ago   Substance and Sexual Activity  . Alcohol use: No  . Drug use: Yes    Frequency: 1.0 times per week    Types: Marijuana  . Sexual activity: Never    Birth control/protection: Surgical  Lifestyle  . Physical activity:    Days per week: 0 days    Minutes per session: 0 min  . Stress: Very much  Relationships  . Social connections:    Talks on phone: Never    Gets together: Twice a week    Attends religious  service: Never    Active member of club or organization: No    Attends meetings of clubs or organizations: Never    Relationship status: Married  Other Topics Concern  . Not on file  Social History Narrative   Volunteers occasionally     Tobacco Counseling Counseling given: Not Answered Comment: quit about 20 years ago    Clinical Intake:  Pre-visit preparation completed: Yes  Pain : No/denies pain Pain Score: 0-No  pain     Nutritional Status: BMI > 30  Obese Nutritional Risks: Nausea/ vomitting/ diarrhea(last episode of vomiting on 6th, still having 3 watery stools a day) Diabetes: Yes CBG done?: No Did pt. bring in CBG monitor from home?: No  How often do you need to have someone help you when you read instructions, pamphlets, or other written materials from your doctor or pharmacy?: 1 - Never What is the last grade level you completed in school?: 12th grade  Interpreter Needed?: No  Information entered by :: Emmaleah Meroney,LPN    Activities of Daily Living In your present state of health, do you have any difficulty performing the following activities: 02/19/2018 12/24/2017  Hearing? N N  Vision? Y Y  Comment right eye blurry  -  Difficulty concentrating or making decisions? N Y  Walking or climbing stairs? N N  Dressing or bathing? N N  Doing errands, shopping? N N  Preparing Food and eating ? N -  Using the Toilet? N -  In the past six months, have you accidently leaked urine? N -  Do you have problems with loss of bowel control? N -  Managing your Medications? N -  Managing your Finances? N -  Housekeeping or managing your Housekeeping? N -  Some recent data might be hidden     Immunizations and Health Maintenance Immunization History  Administered Date(s) Administered  . Influenza,inj,Quad PF,6+ Mos 05/31/2016, 05/02/2017, 07/04/2017  . Pneumococcal Polysaccharide-23 07/04/2017   Health Maintenance Due  Topic Date Due  . OPHTHALMOLOGY EXAM   12/20/1973  . TETANUS/TDAP  12/21/1982  . PAP SMEAR  12/20/1984  . MAMMOGRAM  12/20/2013  . COLONOSCOPY  12/20/2013  . URINE MICROALBUMIN  12/28/2017  . HEMOGLOBIN A1C  12/31/2017    Patient Care Team: Valerie Roys, DO as PCP - General (Family Medicine) [name], MD as Consulting Physician (Gastroenterology) Minna Merritts, MD as Consulting Physician (Cardiology) Alisa Graff, FNP as Nurse Practitioner (Family Medicine)  Indicate any recent Medical Services you may have received from other than [facility] providers in the past year (date may be approximate).     Assessment:   This is a routine wellness examination for [place].  Hearing/Vision screen Vision Screening Comments: No eye dr currently   Dietary issues and exercise activities discussed: Current Exercise Habits: The patient does not participate in regular exercise at present, Exercise limited by: None identified  Goals    . DIET - INCREASE WATER INTAKE     Recommend drinking at least 6-8 glasses of water a day       Depression Screen PHQ 2/9 Scores 02/19/2018 12/24/2017 09/10/2017 07/15/2017 04/29/2017 03/11/2017 02/06/2017  PHQ - 2 Score 2 0 0 0 0 0 0  PHQ- 9 Score 7 - - 7 - - -    Fall Risk Fall Risk  02/19/2018 12/24/2017 09/10/2017 04/29/2017 03/11/2017  Falls in the past year? Yes No No Yes Yes  Number falls in past yr: 1 - - 2 or more 2 or more  Injury with Fall? No - - No No  Risk for fall due to : - - - - -  Follow up - - - Falls prevention discussed -    Is the patient's home free of loose throw rugs in walkways, pet beds, electrical cords, etc?   yes      Grab bars in the bathroom? no      Handrails on the stairs?   yes  Adequate lighting?   yes  Timed Get Up and Go Performed Completed in 8 seconds with no use of assistive devices, steady gait. No intervention needed at this time.   Cognitive Function:     6CIT Screen 02/19/2018  What Year? 0 points  What month? 0 points  What time? 0  points  Count back from 20 0 points  Months in reverse 0 points  Repeat phrase 2 points  Total Score 2    Screening Tests Health Maintenance  Topic Date Due  . OPHTHALMOLOGY EXAM  12/20/1973  . TETANUS/TDAP  12/21/1982  . PAP SMEAR  12/20/1984  . MAMMOGRAM  12/20/2013  . COLONOSCOPY  12/20/2013  . URINE MICROALBUMIN  12/28/2017  . HEMOGLOBIN A1C  12/31/2017  . INFLUENZA VACCINE  03/13/2018  . FOOT EXAM  08/22/2018  . PNEUMOCOCCAL POLYSACCHARIDE VACCINE (2) 07/04/2022  . Hepatitis C Screening  Completed  . HIV Screening  Completed    Qualifies for Shingles Vaccine?  Yes, discussed shingrix vaccine   Cancer Screenings: Lung: Low Dose CT Chest recommended if Age 61-80 years, 30 pack-year currently smoking OR have quit w/in 15years. Patient does not qualify. Breast: Up to date on Mammogram? No , ordered Up to date of Bone Density/Dexa? Not indicated  Colorectal: referral sent to GI   Additional Screenings: Hepatitis C Screening: completed 05/30/2016     Plan:    I have personally reviewed and addressed the Medicare Annual Wellness questionnaire and have noted the following in the patient's chart:  A. Medical and social history B. Use of alcohol, tobacco or illicit drugs  C. Current medications and supplements D. Functional ability and status E.  Nutritional status F.  Physical activity G. Advance directives H. List of other physicians I.  Hospitalizations, surgeries, and ER visits in previous 12 months J.  [place] such as hearing and vision if needed, cognitive and depression L. Referrals and appointments   In addition, I have reviewed and discussed with patient certain preventive protocols, quality metrics, and best practice recommendations. A written personalized care plan for preventive services as well as general preventive health recommendations were provided to patient.   Signed,  Tyler Aas, LPN Nurse Health Advisor   Nurse Notes: none

## 2018-02-20 LAB — COMPREHENSIVE METABOLIC PANEL
ALBUMIN: 4.8 g/dL (ref 3.5–5.5)
ALK PHOS: 135 IU/L — AB (ref 39–117)
ALT: 20 IU/L (ref 0–32)
AST: 18 IU/L (ref 0–40)
Albumin/Globulin Ratio: 1.5 (ref 1.2–2.2)
BILIRUBIN TOTAL: 1.5 mg/dL — AB (ref 0.0–1.2)
BUN/Creatinine Ratio: 15 (ref 9–23)
BUN: 17 mg/dL (ref 6–24)
CALCIUM: 9.6 mg/dL (ref 8.7–10.2)
CHLORIDE: 103 mmol/L (ref 96–106)
CO2: 23 mmol/L (ref 20–29)
Creatinine, Ser: 1.16 mg/dL — ABNORMAL HIGH (ref 0.57–1.00)
GFR calc non Af Amer: 54 mL/min/{1.73_m2} — ABNORMAL LOW (ref >59)
GFR, EST AFRICAN AMERICAN: 62 mL/min/{1.73_m2} (ref >59)
GLUCOSE: 111 mg/dL — AB (ref 65–99)
Globulin, Total: 3.3 g/dL (ref 1.5–4.5)
POTASSIUM: 3.7 mmol/L (ref 3.5–5.2)
Sodium: 142 mmol/L (ref 134–144)
Total Protein: 8.1 g/dL (ref 6.0–8.5)

## 2018-02-20 LAB — LIPID PANEL W/O CHOL/HDL RATIO
CHOLESTEROL TOTAL: 152 mg/dL (ref 100–199)
HDL: 43 mg/dL (ref >39)
LDL Calculated: 76 mg/dL (ref 0–99)
Triglycerides: 167 mg/dL — ABNORMAL HIGH (ref 0–149)
VLDL CHOLESTEROL CAL: 33 mg/dL (ref 5–40)

## 2018-02-20 LAB — CBC WITH DIFFERENTIAL/PLATELET
BASOS: 1 %
Basophils Absolute: 0 10*3/uL (ref 0.0–0.2)
EOS (ABSOLUTE): 0.2 10*3/uL (ref 0.0–0.4)
EOS: 3 %
HEMATOCRIT: 38.3 % (ref 34.0–46.6)
HEMOGLOBIN: 12.5 g/dL (ref 11.1–15.9)
IMMATURE GRANULOCYTES: 0 %
Immature Grans (Abs): 0 10*3/uL (ref 0.0–0.1)
Lymphocytes Absolute: 2.8 10*3/uL (ref 0.7–3.1)
Lymphs: 42 %
MCH: 30.3 pg (ref 26.6–33.0)
MCHC: 32.6 g/dL (ref 31.5–35.7)
MCV: 93 fL (ref 79–97)
MONOCYTES: 6 %
Monocytes Absolute: 0.4 10*3/uL (ref 0.1–0.9)
Neutrophils Absolute: 3.2 10*3/uL (ref 1.4–7.0)
Neutrophils: 48 %
Platelets: 267 10*3/uL (ref 150–450)
RBC: 4.13 x10E6/uL (ref 3.77–5.28)
RDW: 13.5 % (ref 12.3–15.4)
WBC: 6.6 10*3/uL (ref 3.4–10.8)

## 2018-02-20 LAB — TSH: TSH: 1.56 u[IU]/mL (ref 0.450–4.500)

## 2018-02-26 ENCOUNTER — Other Ambulatory Visit: Payer: Self-pay | Admitting: Cardiovascular Disease

## 2018-02-26 NOTE — Telephone Encounter (Signed)
Please advise if ok to refill. 

## 2018-03-06 ENCOUNTER — Ambulatory Visit (INDEPENDENT_AMBULATORY_CARE_PROVIDER_SITE_OTHER): Payer: Medicare Other | Admitting: Gastroenterology

## 2018-03-06 VITALS — BP 110/74 | HR 90 | Ht 65.0 in | Wt 203.0 lb

## 2018-03-06 DIAGNOSIS — K7460 Unspecified cirrhosis of liver: Secondary | ICD-10-CM | POA: Diagnosis not present

## 2018-03-06 DIAGNOSIS — Z1211 Encounter for screening for malignant neoplasm of colon: Secondary | ICD-10-CM | POA: Diagnosis not present

## 2018-03-06 NOTE — Progress Notes (Signed)
Bonnie Antigua, MD [address]  [place]  [place], [place] [address]  Main: [phone]  Fax: [phone]   Primary Care Physician: Valerie Roys, DO  Primary Gastroenterologist:  Dr. Vonda Ochoa  CC: N/V/Diarrhea  HPI: Bonnie Ochoa is a 52 y.o. female patient had 1 week history of nausea vomiting and diarrhea about 3 to 4 weeks ago.  She went to the ER at the time, and also saw her primary care provider since then.  They ordered stool test, but diarrhea has resolved so patient did not have this done.  CBC was normal at the time of the symptoms.  Since resolution of symptoms, patient reports one formed bowel movement daily, without blood, no loss of weight, loss of appetite, or abdominal pain.  Denies any dysphagia or heartburn.  Also has history of cirrhosis, previously seen by Dr. Allen Norris, and attributed to right heart failure with EF of 25% and on AICD.  No history of paracentesis, abdominal distention, GI bleed, confusion.  No previous history of heavy alcohol use.  Has 1 drink every weekend or every other weekend.  No herbal supplements, green tea or over-the-counter products.  Current Outpatient Medications  Medication Sig Dispense Refill  . atorvastatin (LIPITOR) 40 MG tablet TAKE 1 TABLET BY MOUTH EVERY DAY--NEEDS APPT BEFORE RUN OUT 90 tablet 3  . blood glucose meter kit and supplies KIT Dispense based on patient and insurance preference. Use up to four times daily as directed. (FOR ICD-9 250.00, 250.01). 1 each 0  . carvedilol (COREG) 6.25 MG tablet Take 1 tablet (6.25 mg total) by mouth 2 (two) times daily with a meal. 180 tablet 1  . dicyclomine (BENTYL) 20 MG tablet TAKE 1 TABLET (20 MG TOTAL) BY MOUTH 3 (THREE) TIMES DAILY AS NEEDED FOR SPASMS. 90 tablet 2  . famotidine (PEPCID) 40 MG tablet Take 1 tablet (40 mg total) by mouth every evening. 30 tablet 1  . furosemide (LASIX) 20 MG tablet Take 1 tablet (20 mg total) by mouth daily. 30 tablet 5  . Insulin  Glargine (LANTUS SOLOSTAR) 100 UNIT/ML Solostar Pen Inject 40 Units into the skin daily at 10 pm. 5 pen 12  . Insulin Pen Needle 32G X 6 MM MISC 1 each by Does not apply route daily. 100 each 12  . lamoTRIgine (LAMICTAL) 25 MG tablet Take 1 tablet (25 mg total) by mouth 2 (two) times daily. 180 tablet 1  . metoCLOPramide (REGLAN) 10 MG tablet Take 1 tablet (10 mg total) by mouth 3 (three) times daily with meals. 90 tablet 1  . neomycin-polymyxin-hydrocortisone (CORTISPORIN) OTIC solution Place 3 drops into the right ear 4 (four) times daily. (Patient not taking: Reported on 02/19/2018) 10 mL 0  . nitroGLYCERIN (NITROSTAT) 0.4 MG SL tablet Place 1 tablet (0.4 mg total) under the tongue every 5 (five) minutes as needed for chest pain. (Patient not taking: Reported on 02/19/2018) 30 tablet 12  . ondansetron (ZOFRAN ODT) 4 MG disintegrating tablet Take 1 tablet (4 mg total) by mouth every 8 (eight) hours as needed for nausea or vomiting. 20 tablet 0  . pantoprazole (PROTONIX) 40 MG tablet Take 1 tablet (40 mg total) by mouth daily. 30 tablet 11  . PARoxetine (PAXIL) 30 MG tablet Take 1 tablet (30 mg total) by mouth daily. 30 tablet 1  . potassium chloride SA (K-DUR,KLOR-CON) 20 MEQ tablet Take 1 tablet (20 mEq total) by mouth as directed. Take with fluid pill and as directed 180 tablet 3  .  QUEtiapine (SEROQUEL) 300 MG tablet Take 1 tablet (300 mg total) by mouth at bedtime. 30 tablet 1  . sacubitril-valsartan (ENTRESTO) 49-51 MG Take 1 tablet by mouth 2 (two) times daily. 60 tablet 5  . spironolactone (ALDACTONE) 25 MG tablet TAKE 1 TABLET (25 MG TOTAL) BY MOUTH DAILY.  2  . sucralfate (CARAFATE) 1 g tablet Take 1 tablet (1 g total) by mouth 4 (four) times daily. 60 tablet 0  . traMADol (ULTRAM) 50 MG tablet Take 1 tablet (50 mg total) by mouth every 6 (six) hours as needed. 20 tablet 0   No current facility-administered medications for this visit.     Allergies as of 03/06/2018 - Review Complete  02/19/2018  Allergen Reaction Noted  . Levothyroxine Rash 07/02/2017    ROS:  General: Negative for anorexia, weight loss, fever, chills, fatigue, weakness. ENT: Negative for hoarseness, difficulty swallowing , nasal congestion. CV: Negative for chest pain, angina, palpitations, dyspnea on exertion, peripheral edema.  Respiratory: Negative for dyspnea at rest, dyspnea on exertion, cough, sputum, wheezing.  GI: See history of present illness. GU:  Negative for dysuria, hematuria, urinary incontinence, urinary frequency, nocturnal urination.  Endo: Negative for unusual weight change.    Physical Examination:  General: Well-nourished, well-developed in no acute distress.  Eyes: No icterus. Conjunctivae pink. Mouth: Oropharyngeal mucosa moist and pink , no lesions erythema or exudate. Neck: Supple, Trachea midline Abdomen: Bowel sounds are normal, nontender, nondistended, no hepatosplenomegaly or masses, no abdominal bruits or hernia , no rebound or guarding.   Extremities: No lower extremity edema. No clubbing or deformities. Neuro: Alert and oriented x 3.  Grossly intact. Skin: Warm and dry, no jaundice.   Psych: Alert and cooperative, normal mood and affect.   Labs: CMP     Component Value Date/Time   NA 142 02/19/2018 1141   K 3.7 02/19/2018 1141   CL 103 02/19/2018 1141   CO2 23 02/19/2018 1141   GLUCOSE 111 (H) 02/19/2018 1141   GLUCOSE 181 (H) 02/14/2018 1356   BUN 17 02/19/2018 1141   CREATININE 1.16 (H) 02/19/2018 1141   CALCIUM 9.6 02/19/2018 1141   PROT 8.1 02/19/2018 1141   ALBUMIN 4.8 02/19/2018 1141   AST 18 02/19/2018 1141   ALT 20 02/19/2018 1141   ALKPHOS 135 (H) 02/19/2018 1141   BILITOT 1.5 (H) 02/19/2018 1141   GFRNONAA 54 (L) 02/19/2018 1141   GFRAA 62 02/19/2018 1141   Lab Results  Component Value Date   WBC 6.6 02/19/2018   HGB 12.5 02/19/2018   HCT 38.3 02/19/2018   MCV 93 02/19/2018   PLT 267 02/19/2018    Imaging Studies: Dg Chest 2  View  Result Date: 02/14/2018 CLINICAL DATA:  Left-sided chest pain. EXAM: CHEST - 2 VIEW COMPARISON:  11/14/2017. FINDINGS: Cardiac pacer with lead tip over the right ventricle. Heart size normal. Stable mild left base subsegmental atelectasis/scarring. No pleural effusion or pneumothorax. IMPRESSION: 1.  Cardiac pacer with lead tip over the right ventricle. 2. Stable mild left base subsegmental atelectasis/scarring. No pleural effusion or pneumothorax. Electronically Signed   By: Marcello Moores  Register   On: 02/14/2018 14:20   Ct Abdomen Pelvis W Contrast  Result Date: 02/09/2018 CLINICAL DATA:  Nausea, vomiting and abdominal pain. EXAM: CT ABDOMEN AND PELVIS WITH CONTRAST TECHNIQUE: Multidetector CT imaging of the abdomen and pelvis was performed using the standard protocol following bolus administration of intravenous contrast. CONTRAST:  33m OMNIPAQUE IOHEXOL 350 MG/ML SOLN COMPARISON:  CT abdomen pelvis 12/08/2016  FINDINGS: LOWER CHEST: No basilar pulmonary nodules or pleural effusion. No apical pericardial effusion. HEPATOBILIARY: Normal hepatic contours and density. No intra- or extrahepatic biliary dilatation. Normal gallbladder. PANCREAS: Normal parenchymal contours without ductal dilatation. No peripancreatic fluid collection. SPLEEN: Normal. ADRENALS/URINARY TRACT: --Adrenal glands: Normal. --Right kidney/ureter: No hydronephrosis, nephroureterolithiasis, perinephric stranding or solid renal mass. --Left kidney/ureter: No hydronephrosis, nephroureterolithiasis, perinephric stranding or solid renal mass. --Urinary bladder: Normal for degree of distention STOMACH/BOWEL: --Stomach/Duodenum: No hiatal hernia or other gastric abnormality. Normal duodenal course. --Small bowel: No dilatation or inflammation. --Colon: No focal abnormality. --Appendix: Normal. VASCULAR/LYMPHATIC: Normal course and caliber of the major abdominal vessels. No abdominal or pelvic lymphadenopathy. REPRODUCTIVE: Status post  hysterectomy. No adnexal mass. MUSCULOSKELETAL. No bony spinal canal stenosis or focal osseous abnormality. L4-L5 endplate remodeling. OTHER: None. IMPRESSION: No acute abnormality of the abdomen or pelvis. Electronically Signed   By: Ulyses Jarred M.D.   On: 02/09/2018 05:05   US Abdomen Limited Ruq  Result Date: 02/14/2018 CLINICAL DATA:  52 year old female with nausea and vomiting for 2 weeks. EXAM: ULTRASOUND ABDOMEN LIMITED RIGHT UPPER QUADRANT COMPARISON:  02/09/2018 CT, 07/02/2017 ultrasound and prior studies FINDINGS: Gallbladder: The gallbladder is unremarkable. There is no evidence of cholelithiasis or acute cholecystitis Common bile duct: Diameter: 5 mm.  No intrahepatic or extrahepatic biliary dilatation. Liver: No suspicious focal lesion identified. Within normal limits in parenchymal echogenicity. Portal vein is patent on color Doppler imaging with normal direction of blood flow towards the liver. IMPRESSION: No acute or significant abnormality.  Normal gallbladder. Electronically Signed   By: Margarette Canada M.D.   On: 02/14/2018 19:56    Assessment and Plan:   Candence Sease is a 52 y.o. y/o female with recent nausea vomiting diarrhea that have resolved, and here for follow-up of cirrhosis  Nausea vomiting diarrhea likely due to gastroenteritis that has completely resolved clinically No further testing indicated for resolved gastroenteritis  Cirrhosis Ultrasound up-to-date, February 14, 2018 shows no suspicious focal lesion in the liver.  Patent portal vein. We will repeat CMP, INR to recalculate meld We will also obtain viral and autoimmune hepatitis labs Total bilirubin chronically elevated and at baseline, due to cirrhosis Patient educated about avoiding hepatotoxic drugs including alcohol and herbal products or over-the-counter medications No history of GI bleeds We will schedule EGD for variceal screening  We will also schedule colorectal cancer screening  I have discussed  alternative options, risks & benefits,  which include, but are not limited to, bleeding, infection, perforation,respiratory complication & drug reaction.  The patient agrees with this plan & written consent will be obtained.     Dr Bonnie Ochoa

## 2018-03-07 ENCOUNTER — Other Ambulatory Visit: Payer: Self-pay | Admitting: Family Medicine

## 2018-03-09 ENCOUNTER — Other Ambulatory Visit: Payer: Self-pay | Admitting: Cardiovascular Disease

## 2018-03-11 ENCOUNTER — Other Ambulatory Visit: Payer: Self-pay | Admitting: Family Medicine

## 2018-03-13 ENCOUNTER — Encounter: Payer: Self-pay | Admitting: Cardiology

## 2018-03-13 ENCOUNTER — Ambulatory Visit (INDEPENDENT_AMBULATORY_CARE_PROVIDER_SITE_OTHER): Payer: Medicare Other | Admitting: *Deleted

## 2018-03-13 ENCOUNTER — Encounter: Payer: Self-pay | Admitting: Gastroenterology

## 2018-03-13 ENCOUNTER — Ambulatory Visit (INDEPENDENT_AMBULATORY_CARE_PROVIDER_SITE_OTHER): Payer: Medicare Other

## 2018-03-13 ENCOUNTER — Telehealth: Payer: Self-pay

## 2018-03-13 DIAGNOSIS — I5022 Chronic systolic (congestive) heart failure: Secondary | ICD-10-CM

## 2018-03-13 DIAGNOSIS — Z95810 Presence of automatic (implantable) cardiac defibrillator: Secondary | ICD-10-CM | POA: Diagnosis not present

## 2018-03-13 DIAGNOSIS — I428 Other cardiomyopathies: Secondary | ICD-10-CM | POA: Diagnosis not present

## 2018-03-13 NOTE — Progress Notes (Signed)
Remote ICD transmission.   

## 2018-03-13 NOTE — Telephone Encounter (Signed)
LMOVM reminding pt to send remote transmission.   

## 2018-03-14 ENCOUNTER — Telehealth: Payer: Self-pay

## 2018-03-14 ENCOUNTER — Other Ambulatory Visit: Payer: Self-pay | Admitting: Family Medicine

## 2018-03-14 DIAGNOSIS — F410 Panic disorder [episodic paroxysmal anxiety] without agoraphobia: Secondary | ICD-10-CM

## 2018-03-14 DIAGNOSIS — F4312 Post-traumatic stress disorder, chronic: Secondary | ICD-10-CM

## 2018-03-14 DIAGNOSIS — F3130 Bipolar disorder, current episode depressed, mild or moderate severity, unspecified: Secondary | ICD-10-CM

## 2018-03-14 NOTE — Telephone Encounter (Signed)
Interface request for refills on the following;  Seroquel:   And Paxil   LOV:  02/19/18:   With Olevia Perches, DO   Last refill:  02/19/18 on both medications.    Pharmacy:  [facility] #  [phone]

## 2018-03-14 NOTE — Progress Notes (Signed)
EPIC Encounter for ICM Monitoring  Patient Name: Bonnie Ochoa is a 52 y.o. female Date: 03/14/2018 Primary Care Physican: Valerie Roys, DO Primary Cardiologist: Sanjuana Letters, NP Electrophysiologist: Faustino Congress Weight: Previous weight210lbs        Attempted call to patient and unable to reach.  Left detailed message, per DPR, regarding transmission.  Transmission reviewed.    Thoracic impedance normal but was abnormal suggesting fluid accumulation from 02/18/2018 - 03/03/2018.  Prescribed dosage: Furosemide20 mg 1 tablet (41m total) daily.   Labs: 02/19/2018 Creatinine 1.16, BUN 17, Potassium 3.7, Sodium 142, EGFR 54-62 02/14/2018 Creatinine 1.39, BUN 21, Potassium 3.0, Sodium 138, EGFR 42-49  02/09/2018 Creatinine 1.68, BUN 19, Potassium 3.1, Sodium 137, EGFR 33-39  11/14/2017 Creatinine0.97, BUN19, Potassium3.1, [id number] EGFR>60 08/22/2017 Creatinine1.11, BUN16, Potassium4.5, Sodium142, EAESL75-30 08/01/2017 Creatinine1.03, BUN17, Potassium4.2, SYFRTMY111 ENBVA70-14 07/15/2017 Creatinine1.24, BUN15, Potassium3.8, Sodium141, EGFR50-57  07/05/2017 Creatinine2.04, BUN55, Potassium3.5, Sodium134, EDCVU13-14 07/04/2017 Creatinine3.63, BUN100, Potassium3.5, Sodium132, EHOOI75-79 07/03/2017 Creatinine1.74, BUN105, Potassium3.6, SJKQASU015 EGFR10-11  07/02/2017 Creatinine5.45, BUN114, Potassium4.3, SIFBPPH432 EGFR8-9 02/20/2017 Creatinine 1.91, BUN 61, Potassium 4.5, Sodium 134, EGFR 29-34 02/01/2017 Creatinine 1.47, BUN 25, Potassium 4.6, Sodium 138, EGFR 40-47  Recommendations: Left voice mail with ICM number and encouraged to call if experiencing any fluid symptoms.  Follow-up plan: ICM clinic phone appointment on 04/17/2018.     Copy of ICM check sent to Dr. KCaryl Comes   3 month ICM trend: 03/13/2018    1 Year ICM trend:       LRosalene Billings RN 03/14/2018 12:40 PM

## 2018-03-14 NOTE — Telephone Encounter (Signed)
Remote ICM transmission received.  Attempted call to patient and left detailed message, per DPR, regarding transmission and next ICM scheduled for 04/17/2018.  Advised to return call for any fluid symptoms or questions.    

## 2018-03-25 ENCOUNTER — Other Ambulatory Visit: Payer: Self-pay

## 2018-03-25 ENCOUNTER — Encounter: Payer: Self-pay | Admitting: Psychiatry

## 2018-03-25 ENCOUNTER — Ambulatory Visit (INDEPENDENT_AMBULATORY_CARE_PROVIDER_SITE_OTHER): Payer: Medicare Other | Admitting: Psychiatry

## 2018-03-25 VITALS — Temp 97.9°F | Ht 65.0 in | Wt 205.8 lb

## 2018-03-25 DIAGNOSIS — F1021 Alcohol dependence, in remission: Secondary | ICD-10-CM

## 2018-03-25 DIAGNOSIS — F1220 Cannabis dependence, uncomplicated: Secondary | ICD-10-CM | POA: Diagnosis not present

## 2018-03-25 DIAGNOSIS — F410 Panic disorder [episodic paroxysmal anxiety] without agoraphobia: Secondary | ICD-10-CM

## 2018-03-25 DIAGNOSIS — F4312 Post-traumatic stress disorder, chronic: Secondary | ICD-10-CM | POA: Diagnosis not present

## 2018-03-25 DIAGNOSIS — F3130 Bipolar disorder, current episode depressed, mild or moderate severity, unspecified: Secondary | ICD-10-CM | POA: Diagnosis not present

## 2018-03-25 MED ORDER — LAMOTRIGINE 25 MG PO TABS: 25.0000 mg | ORAL_TABLET | Freq: Two times a day (BID) | ORAL | 1 refills | Status: DC

## 2018-03-25 NOTE — Progress Notes (Signed)
[facility] MD OP Progress Note  03/25/2018 4:50 PM Bonnie Ochoa  MRN:  [id number]  Chief Complaint: ' I am here for follow up." Chief Complaint    Follow-up; Medication Refill     HPI: Bonnie Ochoa is a 52 year old African-American female who is engaged, lives in [place], has a history of bipolar disorder, panic disorder, PTSD, ADD, agoraphobia, cannabis use disorder as well as multiple medical problems including osteoarthritis, congestive heart failure, cirrhosis of liver, diabetes, hypertension, IBS, chronic renal failure, OSA on CPAP, RLS, presented to the clinic today for a follow-up visit.  Patient today reports she is currently doing well on the current medication regimen.  She reports she is better able to cope with her mood symptoms and that is an improvement for her.  Patient remains compliant on the medications, denies side effects.  Patient continues to have good support system from wife as well as sister who is here during the visit.  Reports she has been trying to cut down on smoking cannabis ,which is going well. Visit Diagnosis:    ICD-10-CM   1. Bipolar I disorder, most recent episode depressed (HCC) F31.30 lamoTRIgine (LAMICTAL) 25 MG tablet  2. Chronic post-traumatic stress disorder (PTSD) F43.12   3. Panic disorder F41.0   4. Cannabis use disorder, moderate, dependence (HCC) F12.20   5. Alcohol use disorder, severe, in sustained remission ([place]) F10.21     Past Psychiatric History: Reviewed past psychiatric history from my progress note on 10/16/2017.  Past Medical History:  Past Medical History:  Diagnosis Date  . ADHD   . Arthritis   . Asthma   . Bipolar 1 disorder ([place])   . CHF (congestive heart failure) ([place])   . Cirrhosis of liver ([place])   . Coronary artery disease   . Depression   . Diabetes mellitus without complication ([place])   . Diverticulitis   . Hypertension   . IBS (irritable bowel syndrome)   . Insomnia   . Migraines   . PTSD (post-traumatic stress  disorder)   . Restless leg syndrome   . Sleep apnea   . Vertigo     Past Surgical History:  Procedure Laterality Date  . ABDOMINAL HYSTERECTOMY    . debribalator  2016  . INSERT / REPLACE / REMOVE PACEMAKER    . INSERTION OF ICD    . TONSILECTOMY, ADENOIDECTOMY, BILATERAL MYRINGOTOMY AND TUBES    . TONSILLECTOMY    . TONSILLECTOMY    . TUBAL LIGATION  1980  . TUBAL LIGATION      Family Psychiatric History: Reviewed family psychiatric history from my progress note on 10/16/2017.  Family History:  Family History  Problem Relation Age of Onset  . Hypertension Mother   . Hyperlipidemia Mother   . Heart disease Mother   . Hypertension Sister   . Asthma Sister   . Heart disease Sister   . Diabetes Sister   . Cancer Sister   . Alzheimer's disease Maternal Grandfather   . Hyperlipidemia Brother   . Asthma Sister   . Hypertension Sister   . Diabetes Sister     Social History: Reviewed social history from my progress note on 10/16/2017. Social History   Socioeconomic History  . Marital status: Married    Spouse name: Not on file  . Number of children: 2  . Years of education: Not on file  . Highest education level: High school graduate  Occupational History    Comment: disablie   Social Needs  . Emergency planning/management officer  strain: Not hard at all  . Food insecurity:    Worry: Sometimes true    Inability: Sometimes true  . Transportation needs:    Medical: Yes    Non-medical: Yes  Tobacco Use  . Smoking status: Former Smoker    Types: Cigarettes  . Smokeless tobacco: Never Used  . Tobacco comment: quit about 20 years ago   Substance and Sexual Activity  . Alcohol use: No  . Drug use: Yes    Frequency: 1.0 times per week    Types: Marijuana  . Sexual activity: Never    Birth control/protection: Surgical  Lifestyle  . Physical activity:    Days per week: 0 days    Minutes per session: 0 min  . Stress: Very much  Relationships  . Social connections:    Talks on phone:  Never    Gets together: Twice a week    Attends religious service: Never    Active member of club or organization: No    Attends meetings of clubs or organizations: Never    Relationship status: Married  Other Topics Concern  . Not on file  Social History Narrative   Volunteers occasionally     Allergies:  Allergies  Allergen Reactions  . Levothyroxine Rash    Metabolic Disorder Labs: Lab Results  Component Value Date   HGBA1C 7.8 02/19/2018   MPG >398 07/03/2017   No results found for: PROLACTIN Lab Results  Component Value Date   CHOL 152 02/19/2018   TRIG 167 (H) 02/19/2018   HDL 43 02/19/2018   LDLCALC 76 02/19/2018   LDLCALC 103 (H) 12/28/2016   Lab Results  Component Value Date   TSH 1.560 02/19/2018   TSH 2.620 07/15/2017    Therapeutic Level Labs: No results found for: LITHIUM No results found for: VALPROATE No components found for:  CBMZ  Current Medications: Current Outpatient Medications  Medication Sig Dispense Refill  . atorvastatin (LIPITOR) 40 MG tablet TAKE 1 TABLET BY MOUTH EVERY DAY--NEEDS APPT BEFORE RUN OUT 90 tablet 3  . blood glucose meter kit and supplies KIT Dispense based on patient and insurance preference. Use up to four times daily as directed. (FOR ICD-9 250.00, 250.01). 1 each 0  . carvedilol (COREG) 6.25 MG tablet Take 1 tablet (6.25 mg total) by mouth 2 (two) times daily with a meal. 180 tablet 1  . dicyclomine (BENTYL) 20 MG tablet TAKE 1 TABLET (20 MG TOTAL) BY MOUTH 3 (THREE) TIMES DAILY AS NEEDED FOR SPASMS. 90 tablet 2  . famotidine (PEPCID) 40 MG tablet Take 1 tablet (40 mg total) by mouth every evening. 30 tablet 1  . furosemide (LASIX) 20 MG tablet Take 1 tablet (20 mg total) by mouth daily. 30 tablet 5  . Insulin Glargine (LANTUS SOLOSTAR) 100 UNIT/ML Solostar Pen Inject 40 Units into the skin daily at 10 pm. 5 pen 12  . Insulin Pen Needle 32G X 6 MM MISC 1 each by Does not apply route daily. 100 each 12  . lamoTRIgine  (LAMICTAL) 25 MG tablet Take 1 tablet (25 mg total) by mouth 2 (two) times daily. 180 tablet 1  . metoCLOPramide (REGLAN) 10 MG tablet Take 1 tablet (10 mg total) by mouth 3 (three) times daily with meals. 90 tablet 1  . neomycin-polymyxin-hydrocortisone (CORTISPORIN) OTIC solution Place 3 drops into the right ear 4 (four) times daily. 10 mL 0  . nitroGLYCERIN (NITROSTAT) 0.4 MG SL tablet Place 1 tablet (0.4 mg total) under the tongue  every 5 (five) minutes as needed for chest pain. 30 tablet 12  . ondansetron (ZOFRAN ODT) 4 MG disintegrating tablet Take 1 tablet (4 mg total) by mouth every 8 (eight) hours as needed for nausea or vomiting. 20 tablet 0  . pantoprazole (PROTONIX) 40 MG tablet Take 1 tablet (40 mg total) by mouth daily. 30 tablet 11  . PARoxetine (PAXIL) 30 MG tablet TAKE 1 TABLET BY MOUTH EVERY DAY 90 tablet 1  . potassium chloride SA (K-DUR,KLOR-CON) 20 MEQ tablet Take 1 tablet (20 mEq total) by mouth as directed. Take with fluid pill and as directed 180 tablet 3  . QUEtiapine (SEROQUEL) 300 MG tablet TAKE 1 TABLET BY MOUTH EVERYDAY AT BEDTIME 90 tablet 1  . sacubitril-valsartan (ENTRESTO) 49-51 MG Take 1 tablet by mouth 2 (two) times daily. 60 tablet 5  . spironolactone (ALDACTONE) 25 MG tablet TAKE 1 TABLET (25 MG TOTAL) BY MOUTH DAILY.  2  . sucralfate (CARAFATE) 1 g tablet TAKE 1 TABLET BY MOUTH 4 TIMES A DAY 60 tablet 0  . traMADol (ULTRAM) 50 MG tablet Take 1 tablet (50 mg total) by mouth every 6 (six) hours as needed. 20 tablet 0   No current facility-administered medications for this visit.      Musculoskeletal: Strength & Muscle Tone: within normal limits Gait & Station: normal Patient leans: N/A  Psychiatric Specialty Exam: Review of Systems  Psychiatric/Behavioral: The patient is nervous/anxious (improving).   All other systems reviewed and are negative.   Temperature 97.9 F (36.6 C), temperature source Oral, height 5' 5"  (1.651 m), weight 205 lb 12.8 oz (93.4  kg).Body mass index is 34.25 kg/m.  General Appearance: Casual  Eye Contact:  Fair  Speech:  Clear and Coherent  Volume:  Normal  Mood:  Anxious  Affect:  Appropriate  Thought Process:  Goal Directed and Descriptions of Associations: Intact  Orientation:  Full (Time, Place, and Person)  Thought Content: Logical   Suicidal Thoughts:  No  Homicidal Thoughts:  No  Memory:  Immediate;   Fair Recent;   Fair Remote;   Fair  Judgement:  Fair  Insight:  Fair  Psychomotor Activity:  Normal  Concentration:  Concentration: Fair and Attention Span: Fair  Recall:  [employer] of Knowledge: Fair  Language: Fair  Akathisia:  No  Handed:  Right  AIMS (if indicated): 0  Assets:  Communication Skills Desire for Improvement Social Support  ADL's:  Intact  Cognition: WNL  Sleep:  Fair   Screenings: GAD-7     Office Visit from 12/28/2016 in [facility]  Total GAD-7 Score  21    PHQ2-9     Clinical Support from 02/19/2018 in [facility] Visit from 12/24/2017 in [facility] Office Visit from 09/10/2017 in [facility] Office Visit from 07/15/2017 in [facility] Office Visit from 04/29/2017 in [facility]  PHQ-2 Total Score  2  0  0  0  0  PHQ-9 Total Score  7  -  -  7  -       Assessment and Plan: Swayzee is a 52 year old African-American female who has a history of bipolar disorder, PTSD, panic disorder, cannabis use disorder as well as multiple medical problems including congestive heart failure, liver disease, chronic renal failure, diabetes mellitus, OSA, hypertension and RLS, presented to the clinic today for a follow-up visit.  Patient currently is  doing well on the current combination of medication.  Will continue plan as noted below.  Plan Bipolar disorder Continue Seroquel 300 mg p.o. nightly Lamictal 25 mg p.o. twice  daily Continue Paxil 30 mg p.o. daily   PTSD Paxil 30 mg p.o. daily Seroquel 300 mg p.o. nightly   For panic disorder Paxil 30 mg p.o. daily  For insomnia Continue Seroquel 300 mg p.o. nightly OSA, compliant with CPAP  Cannabis use disorder Provided substance use counseling, patient is trying to quit.  Alcohol use disorder in remission She quit in 2006, occasional use only.  Patient can continue to see Ms. Royal Piedra as needed for CBT.  Follow-up in clinic in 3-4 months or sooner if needed.  More than 50 % of the time was spent for psychoeducation and supportive psychotherapy and care coordination.  This note was generated in part or whole with voice recognition software. Voice recognition is usually quite accurate but there are transcription errors that can and very often do occur. I apologize for any typographical errors that were not detected and corrected.       [name], MD 03/25/2018, 4:50 PM

## 2018-03-26 ENCOUNTER — Ambulatory Visit: Payer: Medicare Other | Admitting: Family

## 2018-03-26 NOTE — Progress Notes (Deleted)
Patient ID: Bonnie Ochoa, female    DOB: [birth year], 52 y.o.   MRN: [id number]  HPI  Bonnie Ochoa is a 52 y/o female with a history of obstructive sleep apnea, restless leg syndrome, PTSD, HTN, DM, depression, CAD, cirrhosis, bipolar, asthma, marijuana use and chronic heart failure.   Echo report from 07/03/17 reviewed and showed an EF of 20-25%. Echo was done 05/28/16 and showed an EF of 20-25% along with moderate MR/TR and severely elevated PA pressure of 64 mm Hg.    Was in the ED 02/14/18 due to gastritis. Abdominal ultrasound negative for stones. Released same day. Was in the ED 02/09/18 due to abdominal pain. Abdominal/pelvic CT were negative. Released same day. Was in the ED 11/15/17 due to chest pain where she was evaluated and released.    She presents today for her follow-up visit with a chief complaint of    Past Medical History:  Diagnosis Date  . ADHD   . Arthritis   . Asthma   . Bipolar 1 disorder (HCC)   . CHF (congestive heart failure) (HCC)   . Cirrhosis of liver (HCC)   . Coronary artery disease   . Depression   . Diabetes mellitus without complication (HCC)   . Diverticulitis   . Hypertension   . IBS (irritable bowel syndrome)   . Insomnia   . Migraines   . PTSD (post-traumatic stress disorder)   . Restless leg syndrome   . Sleep apnea   . Vertigo    Past Surgical History:  Procedure Laterality Date  . ABDOMINAL HYSTERECTOMY    . debribalator  2016  . INSERT / REPLACE / REMOVE PACEMAKER    . INSERTION OF ICD    . TONSILECTOMY, ADENOIDECTOMY, BILATERAL MYRINGOTOMY AND TUBES    . TONSILLECTOMY    . TONSILLECTOMY    . TUBAL LIGATION  1980  . TUBAL LIGATION     Family History  Problem Relation Age of Onset  . Hypertension Mother   . Hyperlipidemia Mother   . Heart disease Mother   . Hypertension Sister   . Asthma Sister   . Heart disease Sister   . Diabetes Sister   . Cancer Sister   . Alzheimer's disease Maternal Grandfather   . Hyperlipidemia Brother    . Asthma Sister   . Hypertension Sister   . Diabetes Sister    Social History   Tobacco Use  . Smoking status: Former Smoker    Types: Cigarettes  . Smokeless tobacco: Never Used  . Tobacco comment: quit about 20 years ago   Substance Use Topics  . Alcohol use: No   Allergies  Allergen Reactions  . Levothyroxine Rash     Review of Systems  Constitutional: Positive for fatigue ("better"). Negative for appetite change.  HENT: Negative for congestion, ear pain, nosebleeds and sore throat.   Eyes: Negative.   Respiratory: Negative for chest tightness and shortness of breath.   Cardiovascular: Negative for chest pain, palpitations and leg swelling.  Gastrointestinal: Negative for abdominal distention and abdominal pain.  Endocrine: Negative.   Genitourinary: Negative.   Musculoskeletal: Negative for arthralgias and neck pain.  Skin: Negative.   Allergic/Immunologic: Negative.   Neurological: Negative for dizziness and light-headedness.  Hematological: Negative for adenopathy. Does not bruise/bleed easily.  Psychiatric/Behavioral: Negative for dysphoric mood, sleep disturbance (sleeping on 2 pillows) and suicidal ideas. The patient is not nervous/anxious.     Physical Exam  Constitutional: She is oriented to person, place, and time. She  appears well-developed and well-nourished.  HENT:  Head: Normocephalic and atraumatic.  Neck: Normal range of motion. Neck supple. No JVD present.  Cardiovascular: Regular rhythm. Tachycardia present.  Pulmonary/Chest: Effort normal. She has no wheezes. She has no rales.  Abdominal: Soft. She exhibits no distension. There is no tenderness.  Musculoskeletal: She exhibits no edema or tenderness.  Neurological: She is alert and oriented to person, place, and time.  Skin: Skin is warm and dry.  Psychiatric: She has a normal mood and affect. Her behavior is normal. Thought content normal.  Nursing note and vitals reviewed.  Assessment &  Plan:  1: Chronic heart failure with reduced ejection fraction- - NYHA class II - euvolemic - weighing daily at home when she thinks about it. Encouraged to weigh daily and call for an overnight weight gain of >2 pounds or a weekly weight gain of >5 pounds - weight  - not adding salt to her food and is trying to eat low sodium foods. Continues to read food labels so that she can keep her sodium intake to 2000mg  daily. - walking daily about a mile - Has been trying to drink around 5-6 cups (8oz) of water a day. Discussed the importance of maintaining her fluid intake between 40-48 ounces of fluid daily.  - increase carvedilol at her next visit especially if she remains tachycardic - saw cardiologist Mariah Milling) 12/09/17 - saw EP Graciela Husbands) 08/01/17 - BNP 09/17/16 was >4500.0  2: HTN- - BP looks good today although doubtful we can titrate up entresto - saw PCP Laural Benes) 02/19/18 - BMP from 02/19/18 reviewed and showed sodium 142, potassium 3.7 and GFR 62  3: Diabetes- - home glucose readings  - saw nephrologist 04/10/17 - A1c on 07/03/17 was >15.5%  4: Hypokalemia- - patient just started taking her potassium supplements today due to "issue with pharmacy" - she is unsure if she has an appointment to get her labs rechecked and will check with her sister, Toniann Fail, who handles all of this; explained that I could order the labs if needed in about 3 weeks since she just started the potassium today  Patient did not bring her medications nor a list. Each medication was verbally reviewed with the patient and she was encouraged to bring the bottles to every visit to confirm accuracy of list.

## 2018-03-27 ENCOUNTER — Other Ambulatory Visit: Payer: Self-pay | Admitting: Family Medicine

## 2018-04-01 ENCOUNTER — Ambulatory Visit: Payer: Medicare Other | Admitting: Family

## 2018-04-01 ENCOUNTER — Telehealth: Payer: Self-pay | Admitting: Family

## 2018-04-01 ENCOUNTER — Encounter: Payer: Self-pay | Admitting: *Deleted

## 2018-04-01 NOTE — Telephone Encounter (Signed)
Patient did not show for her [facility] appointment on 04/01/18. Will attempt to reschedule.

## 2018-04-01 NOTE — Progress Notes (Deleted)
Patient ID: Bonnie Ochoa, female    DOB: 07/12/1966, 52 y.o.   MRN: [id number]  HPI  Bonnie Ochoa is a 52 y/o female with a history of obstructive sleep apnea, restless leg syndrome, PTSD, HTN, DM, depression, CAD, cirrhosis, bipolar, asthma, marijuana use and chronic heart failure.   Echo report from 07/03/17 reviewed and showed an EF of 20-25%. Echo was done 05/28/16 and showed an EF of 20-25% along with moderate MR/TR and severely elevated PA pressure of 64 mm Hg.    Was in the ED 02/14/18 due to gastritis. Abdominal ultrasound negative for stones. Released same day. Was in the ED 02/09/18 due to abdominal pain. Abdominal/pelvic CT were negative. Released same day. Was in the ED 11/15/17 due to chest pain where she was evaluated and released.    She presents today for her follow-up visit with a chief complaint of    Past Medical History:  Diagnosis Date  . ADHD   . Arthritis   . Asthma   . Bipolar 1 disorder (HCC)   . CHF (congestive heart failure) (HCC)   . Cirrhosis of liver (HCC)   . Coronary artery disease   . Depression   . Diabetes mellitus without complication (HCC)   . Diverticulitis   . Hypertension   . IBS (irritable bowel syndrome)   . Insomnia   . Migraines   . PTSD (post-traumatic stress disorder)   . Restless leg syndrome   . Sleep apnea   . Vertigo    Past Surgical History:  Procedure Laterality Date  . ABDOMINAL HYSTERECTOMY    . debribalator  2016  . INSERT / REPLACE / REMOVE PACEMAKER    . INSERTION OF ICD    . TONSILECTOMY, ADENOIDECTOMY, BILATERAL MYRINGOTOMY AND TUBES    . TONSILLECTOMY    . TONSILLECTOMY    . TUBAL LIGATION  1980  . TUBAL LIGATION     Family History  Problem Relation Age of Onset  . Hypertension Mother   . Hyperlipidemia Mother   . Heart disease Mother   . Hypertension Sister   . Asthma Sister   . Heart disease Sister   . Diabetes Sister   . Cancer Sister   . Alzheimer's disease Maternal Grandfather   . Hyperlipidemia Brother    . Asthma Sister   . Hypertension Sister   . Diabetes Sister    Social History   Tobacco Use  . Smoking status: Former Smoker    Types: Cigarettes  . Smokeless tobacco: Never Used  . Tobacco comment: quit about 20 years ago   Substance Use Topics  . Alcohol use: No   Allergies  Allergen Reactions  . Levothyroxine Rash     Review of Systems  Constitutional: Positive for fatigue ("better"). Negative for appetite change.  HENT: Negative for congestion, ear pain, nosebleeds and sore throat.   Eyes: Negative.   Respiratory: Negative for chest tightness and shortness of breath.   Cardiovascular: Negative for chest pain, palpitations and leg swelling.  Gastrointestinal: Negative for abdominal distention and abdominal pain.  Endocrine: Negative.   Genitourinary: Negative.   Musculoskeletal: Negative for arthralgias and neck pain.  Skin: Negative.   Allergic/Immunologic: Negative.   Neurological: Negative for dizziness and light-headedness.  Hematological: Negative for adenopathy. Does not bruise/bleed easily.  Psychiatric/Behavioral: Negative for dysphoric mood, sleep disturbance (sleeping on 2 pillows) and suicidal ideas. The patient is not nervous/anxious.     Physical Exam  Constitutional: She is oriented to person, place, and time. She   appears well-developed and well-nourished.  HENT:  Head: Normocephalic and atraumatic.  Neck: Normal range of motion. Neck supple. No JVD present.  Cardiovascular: Regular rhythm. Tachycardia present.  Pulmonary/Chest: Effort normal. She has no wheezes. She has no rales.  Abdominal: Soft. She exhibits no distension. There is no tenderness.  Musculoskeletal: She exhibits no edema or tenderness.  Neurological: She is alert and oriented to person, place, and time.  Skin: Skin is warm and dry.  Psychiatric: She has a normal mood and affect. Her behavior is normal. Thought content normal.  Nursing note and vitals reviewed.  Assessment &  Plan:  1: Chronic heart failure with reduced ejection fraction- - NYHA class II - euvolemic - weighing daily at home when she thinks about it. Encouraged to weigh daily and call for an overnight weight gain of >2 pounds or a weekly weight gain of >5 pounds - weight  - not adding salt to her food and is trying to eat low sodium foods. Continues to read food labels so that she can keep her sodium intake to <2000mg daily. - walking daily about a mile - Has been trying to drink around 5-6 cups (8oz) of water a day. Discussed the importance of maintaining her fluid intake between 40-48 ounces of fluid daily.  - increase carvedilol at her next visit especially if she remains tachycardic - saw cardiologist (Gollan) 12/09/17 - saw EP (Klein) 08/01/17 - BNP 09/17/16 was >4500.0  2: HTN- - BP looks good today although doubtful we can titrate up entresto - saw PCP (Johnson) 02/19/18 - BMP from 02/19/18 reviewed and showed sodium 142, potassium 3.7 and GFR 62  3: Diabetes- - home glucose readings  - saw nephrologist 04/10/17 - A1c on 07/03/17 was >15.5%  4: Hypokalemia- - patient just started taking her potassium supplements today due to "issue with pharmacy" - she is unsure if she has an appointment to get her labs rechecked and will check with her sister, Wendy, who handles all of this; explained that I could order the labs if needed in about 3 weeks since she just started the potassium today  Patient did not bring her medications nor a list. Each medication was verbally reviewed with the patient and she was encouraged to bring the bottles to every visit to confirm accuracy of list.        

## 2018-04-02 ENCOUNTER — Ambulatory Visit: Admission: RE | Admit: 2018-04-02 | Payer: Medicare Other | Source: Ambulatory Visit | Admitting: Gastroenterology

## 2018-04-02 ENCOUNTER — Encounter: Payer: Self-pay | Admitting: Certified Registered Nurse Anesthetist

## 2018-04-02 ENCOUNTER — Encounter: Admission: RE | Payer: Self-pay | Source: Ambulatory Visit

## 2018-04-02 SURGERY — COLONOSCOPY WITH PROPOFOL
Anesthesia: General

## 2018-04-02 MED ORDER — PROPOFOL 500 MG/50ML IV EMUL
INTRAVENOUS | Status: AC
  Filled 2018-04-02: qty 50

## 2018-04-02 MED ORDER — FENTANYL CITRATE (PF) 100 MCG/2ML IJ SOLN
INTRAMUSCULAR | Status: AC
  Filled 2018-04-02: qty 2

## 2018-04-02 MED ORDER — MIDAZOLAM HCL 2 MG/2ML IJ SOLN
INTRAMUSCULAR | Status: AC
  Filled 2018-04-02: qty 2

## 2018-04-07 ENCOUNTER — Encounter: Payer: Self-pay | Admitting: Family Medicine

## 2018-04-07 ENCOUNTER — Other Ambulatory Visit: Payer: Self-pay | Admitting: Cardiovascular Disease

## 2018-04-07 ENCOUNTER — Ambulatory Visit (INDEPENDENT_AMBULATORY_CARE_PROVIDER_SITE_OTHER): Payer: Medicare Other | Admitting: Family Medicine

## 2018-04-07 VITALS — BP 90/64 | HR 86 | Temp 98.4°F | Wt 208.0 lb

## 2018-04-07 DIAGNOSIS — M65321 Trigger finger, right index finger: Secondary | ICD-10-CM | POA: Diagnosis not present

## 2018-04-07 DIAGNOSIS — E042 Nontoxic multinodular goiter: Secondary | ICD-10-CM | POA: Diagnosis not present

## 2018-04-07 DIAGNOSIS — E039 Hypothyroidism, unspecified: Secondary | ICD-10-CM | POA: Diagnosis not present

## 2018-04-07 DIAGNOSIS — F319 Bipolar disorder, unspecified: Secondary | ICD-10-CM

## 2018-04-07 DIAGNOSIS — Z23 Encounter for immunization: Secondary | ICD-10-CM

## 2018-04-07 DIAGNOSIS — S41101A Unspecified open wound of right upper arm, initial encounter: Secondary | ICD-10-CM | POA: Diagnosis not present

## 2018-04-07 DIAGNOSIS — K7031 Alcoholic cirrhosis of liver with ascites: Secondary | ICD-10-CM | POA: Diagnosis not present

## 2018-04-07 DIAGNOSIS — I5022 Chronic systolic (congestive) heart failure: Secondary | ICD-10-CM | POA: Diagnosis not present

## 2018-04-07 MED ORDER — INSULIN PEN NEEDLE 32G X 6 MM MISC: 1.0000 | Freq: Every day | 12 refills | Status: DC

## 2018-04-07 NOTE — Assessment & Plan Note (Signed)
Rechecking levels today. Doesn't want to go see endocrine just yet- too many appointments, will discuss next visit. Will get thyroid ultrasound. Ordered today. 

## 2018-04-07 NOTE — Progress Notes (Signed)
BP 90/64 (BP Location: Left Arm, Patient Position: Sitting, Cuff Size: Large)   Pulse 86   Temp 98.4 F (36.9 C)   Wt 208 lb (94.3 kg)   SpO2 97%   BMI 34.61 kg/m    Subjective:    Patient ID: Bonnie Ochoa, female    DOB: 06/10/1966, 52 y.o.   MRN: [id number]  HPI: Bonnie Ochoa is a 52 y.o. female  Chief Complaint  Patient presents with  . Hypothyroidism  . Hand Pain   Bonnie Ochoa went to see GI regarding her cirrhosis on 03/06/18- they wanted to run blood work to look for the cause of her cirrhosis- she did not get them done. She did not have her colonoscopy done on 04/02/18 as scheduled. She has an appointment scheduled with Dr. Maximino Greenland in October.   She has missed 2 appointments with the heart failure clinic. She has a follow up appointment with her cardiologist next week.   She went to see psychiatry on 03/25/18. At her appointment in March, she had been doing well. Controlled on her seroquel, lamictal and paxil. She had run out of her medicine last visit- but we refilled it and she got back in to see her psychiatrist. She is due to follow up with them in the fall or winter.   Trigger finger has resolved! No issues with it.   She has not been to the ER since her last appointment.   Relevant past medical, surgical, family and social history reviewed and updated as indicated. Interim medical history since our last visit reviewed. Allergies and medications reviewed and updated.  Review of Systems  Constitutional: Negative.   Respiratory: Negative.   Cardiovascular: Negative.   Gastrointestinal: Negative.   Musculoskeletal: Negative.   Neurological: Negative.   Psychiatric/Behavioral: Negative.     Per HPI unless specifically indicated above     Objective:    BP 90/64 (BP Location: Left Arm, Patient Position: Sitting, Cuff Size: Large)   Pulse 86   Temp 98.4 F (36.9 C)   Wt 208 lb (94.3 kg)   SpO2 97%   BMI 34.61 kg/m   Wt Readings from Last 3 Encounters:    04/07/18 208 lb (94.3 kg)  03/25/18 205 lb 12.8 oz (93.4 kg)  03/13/18 203 lb (92.1 kg)    Physical Exam  Constitutional: She is oriented to person, place, and time. She appears well-developed and well-nourished. No distress.  HENT:  Head: Normocephalic and atraumatic.  Right Ear: Hearing normal.  Left Ear: Hearing normal.  Nose: Nose normal.  Eyes: Conjunctivae and lids are normal. Right eye exhibits no discharge. Left eye exhibits no discharge. No scleral icterus.  Cardiovascular: Normal rate, regular rhythm, normal heart sounds and intact distal pulses. Exam reveals no gallop and no friction rub.  No murmur heard. Pulmonary/Chest: Effort normal and breath sounds normal. No stridor. No respiratory distress. She has no wheezes. She has no rales. She exhibits no tenderness.  Abdominal: Soft. Bowel sounds are normal. She exhibits no distension and no mass. There is no tenderness. There is no rebound and no guarding. No hernia.  Musculoskeletal: Normal range of motion.  Neurological: She is alert and oriented to person, place, and time.  Skin: Skin is warm, dry and intact. Capillary refill takes less than 2 seconds. No rash noted. She is not diaphoretic. No erythema. No pallor.  Small, well healing wound of R arm  Psychiatric: She has a normal mood and affect. Her speech is normal and behavior is  normal. Judgment and thought content normal. Cognition and memory are normal.  Nursing note and vitals reviewed.   Results for orders placed or performed in visit on 03/12/18  Hemoglobin A1c  Result Value Ref Range   Hemoglobin A1C 7.8       Assessment & Plan:   Problem List Items Addressed This Visit      Cardiovascular and Mediastinum   Chronic systolic heart failure (HCC) (Chronic)    Euvolemic today. Doing well. Follow up with cardiology as needed. Call with any concerns.         Digestive   Cirrhosis (HCC) - Primary    Did not get her blood work done for GI at her last visit.  Will draw today. Orders placed for patient to get what Dr. Maximino Greenland wanted. Await results. Will forward when they come in. Encouraged patient to reach out to GI to get colonoscopy rescheduled.       Relevant Orders   AFP tumor marker   Ferritin   Ceruloplasmin   Mitochondrial antibodies   Anti-smooth muscle antibody, IgG   Hepatitis C Antibody   Hepatitis B Surface AntiGEN   Hepatitis B surface antibody,quantitative   Hepatitis B Core Antibody, total   Hepatitis A Ab, Total   INR/PT   Comprehensive metabolic panel     Endocrine   Multiple thyroid nodules    Rechecking levels today. Doesn't want to go see endocrine just yet- too many appointments, will discuss next visit. Will get thyroid ultrasound. Ordered today.      Relevant Orders   US Soft Tissue Head/Neck   Hypothyroid    Rechecking levels today. Doesn't want to go see endocrine just yet- too many appointments, will discuss next visit. Will get thyroid ultrasound. Ordered today.        Other   Bipolar 1 disorder (HCC)    Doing much better on current regimen. Following with psychiatry as supposed to. Will continue to monitor and follow up with them in December as scheduled.       Other Visit Diagnoses    Immunization due       Flu shot given today.   Relevant Orders   Flu Vaccine QUAD 6+ mos PF IM (Fluarix Quad PF) (Completed)   Trigger index finger of right hand       Resolved.   Open wound of right upper arm, initial encounter       Healing well. Due for Td. Given today.       Follow up plan: Return in about 2 months (around 06/07/2018) for DM follow up.

## 2018-04-07 NOTE — Patient Instructions (Addendum)
Call to reschedule your colonoscopy:  [phone]    Td Vaccine (Tetanus and Diphtheria): What You Need to Know 1. Why get vaccinated? Tetanus  and diphtheria are very serious diseases. They are rare in the [place] today, but people who do become infected often have severe complications. Td vaccine is used to protect adolescents and adults from both of these diseases. Both tetanus and diphtheria are infections caused by bacteria. Diphtheria spreads from person to person through coughing or sneezing. Tetanus-causing bacteria enter the body through cuts, scratches, or wounds. TETANUS (lockjaw) causes painful muscle tightening and stiffness, usually all over the body.  It can lead to tightening of muscles in the head and neck so you can't open your mouth, swallow, or sometimes even breathe. Tetanus kills about 1 out of every 10 people who are infected even after receiving the best medical care.  DIPHTHERIA can cause a thick coating to form in the back of the throat.  It can lead to breathing problems, paralysis, heart failure, and death.  Before vaccines, as many as 200,000 cases of diphtheria and hundreds of cases of tetanus were reported in the [place] each year. Since vaccination began, reports of cases for both diseases have dropped by about 99%. 2. Td vaccine Td vaccine can protect adolescents and adults from tetanus and diphtheria. Td is usually given as a booster dose every 10 years but it can also be given earlier after a severe and dirty wound or burn. Another vaccine, called Tdap, which protects against pertussis in addition to tetanus and diphtheria, is sometimes recommended instead of Td vaccine. Your doctor or the person giving you the vaccine can give you more information. Td may safely be given at the same time as other vaccines. 3. Some people should not get this vaccine  A person who has ever had a life-threatening allergic reaction after a previous dose of any  tetanus or diphtheria containing vaccine, OR has a severe allergy to any part of this vaccine, should not get Td vaccine. Tell the person giving the vaccine about any severe allergies.  Talk to your doctor if you: ? had severe pain or swelling after any vaccine containing diphtheria or tetanus, ? ever had a condition called Guillain Barre Syndrome (GBS), ? aren't feeling well on the day the shot is scheduled. 4. What are the risks from Td vaccine? With any medicine, including vaccines, there is a chance of side effects. These are usually mild and go away on their own. Serious reactions are also possible but are rare. Most people who get Td vaccine do not have any problems with it. Mild problems following Td vaccine: (Did not interfere with activities)  Pain where the shot was given (about 8 people in 10)  Redness or swelling where the shot was given (about 1 person in 4)  Mild fever (rare)  Headache (about 1 person in 4)  Tiredness (about 1 person in 4)  Moderate problems following Td vaccine: (Interfered with activities, but did not require medical attention)  Fever over 102F (rare)  Severe problems following Td vaccine: (Unable to perform usual activities; required medical attention)  Swelling, severe pain, bleeding and/or redness in the arm where the shot was given (rare).  Problems that could happen after any vaccine:  People sometimes faint after a medical procedure, including vaccination. Sitting or lying down for about 15 minutes can help prevent fainting, and injuries caused by a fall. Tell your doctor if you feel dizzy, or have vision  changes or ringing in the ears.  Some people get severe pain in the shoulder and have difficulty moving the arm where a shot was given. This happens very rarely.  Any medication can cause a severe allergic reaction. Such reactions from a vaccine are very rare, estimated at fewer than 1 in a million doses, and would happen within a few  minutes to a few hours after the vaccination. As with any medicine, there is a very remote chance of a vaccine causing a serious injury or death. The safety of vaccines is always being monitored. For more information, visit: http://floyd.org/ 5. What if there is a serious reaction? What should I look for? Look for anything that concerns you, such as signs of a severe allergic reaction, very high fever, or unusual behavior. Signs of a severe allergic reaction can include hives, swelling of the face and throat, difficulty breathing, a fast heartbeat, dizziness, and weakness. These would usually start a few minutes to a few hours after the vaccination. What should I do?  If you think it is a severe allergic reaction or other emergency that can't wait, call 9-1-1 or get the person to the nearest hospital. Otherwise, call your doctor.  Afterward, the reaction should be reported to the Vaccine Adverse Event Reporting System (VAERS). Your doctor might file this report, or you can do it yourself through the VAERS web site at www.vaers.LAgents.no, or by calling 1-630-683-0426. ? VAERS does not give medical advice. 6. The National Vaccine Injury Compensation Program The [employer] Vaccine Injury Compensation Program (VICP) is a federal program that was created to compensate people who may have been injured by certain vaccines. Persons who believe they may have been injured by a vaccine can learn about the program and about filing a claim by calling 1-(234)333-7644 or visiting the VICP website at SpiritualWord.at. There is a time limit to file a claim for compensation. 7. How can I learn more?  Ask your doctor. He or she can give you the vaccine package insert or suggest other sources of information.  Call your local or state health department.  Contact the Centers for Disease Control and Prevention (CDC): ? Call 306 515 5845 (1-800-CDC-INFO) ? Visit CDC's website at  PicCapture.uy CDC Td Vaccine VIS (11/22/15) This information is not intended to replace advice given to you by your health care provider. Make sure you discuss any questions you have with your health care provider. Document Released: 05/27/2006 Document Revised: 04/19/2016 Document Reviewed: 04/19/2016 Elsevier Interactive Patient Education  2017 [employer].

## 2018-04-07 NOTE — Assessment & Plan Note (Signed)
Rechecking levels today. Doesn't want to go see endocrine just yet- too many appointments, will discuss next visit. Will get thyroid ultrasound. Ordered today.

## 2018-04-07 NOTE — Telephone Encounter (Signed)
Pharmacy requesting a refill on Pepcid.  The patient was on protonix at her last office visit.  Please review for refill.

## 2018-04-07 NOTE — Assessment & Plan Note (Signed)
Did not get her blood work done for GI at her last visit. Will draw today. Orders placed for patient to get what Dr. Maximino Greenland wanted. Await results. Will forward when they come in. Encouraged patient to reach out to GI to get colonoscopy rescheduled.

## 2018-04-07 NOTE — Assessment & Plan Note (Signed)
Euvolemic today. Doing well. Follow up with cardiology as needed. Call with any concerns.

## 2018-04-07 NOTE — Assessment & Plan Note (Signed)
Doing much better on current regimen. Following with psychiatry as supposed to. Will continue to monitor and follow up with them in December as scheduled.

## 2018-04-08 ENCOUNTER — Telehealth: Payer: Self-pay | Admitting: Family Medicine

## 2018-04-08 DIAGNOSIS — Z23 Encounter for immunization: Secondary | ICD-10-CM

## 2018-04-08 LAB — FERRITIN: Ferritin: 134 ng/mL (ref 15–150)

## 2018-04-08 LAB — COMPREHENSIVE METABOLIC PANEL
ALBUMIN: 4.8 g/dL (ref 3.5–5.5)
ALT: 17 IU/L (ref 0–32)
AST: 10 IU/L (ref 0–40)
Albumin/Globulin Ratio: 1.2 (ref 1.2–2.2)
Alkaline Phosphatase: 144 IU/L — ABNORMAL HIGH (ref 39–117)
BILIRUBIN TOTAL: 0.8 mg/dL (ref 0.0–1.2)
BUN / CREAT RATIO: 14 (ref 9–23)
BUN: 24 mg/dL (ref 6–24)
CO2: 23 mmol/L (ref 20–29)
CREATININE: 1.67 mg/dL — AB (ref 0.57–1.00)
Calcium: 10.2 mg/dL (ref 8.7–10.2)
Chloride: 97 mmol/L (ref 96–106)
GFR calc non Af Amer: 34 mL/min/{1.73_m2} — ABNORMAL LOW (ref >59)
GFR, EST AFRICAN AMERICAN: 40 mL/min/{1.73_m2} — AB (ref >59)
GLUCOSE: 142 mg/dL — AB (ref 65–99)
Globulin, Total: 3.9 g/dL (ref 1.5–4.5)
Potassium: 4.3 mmol/L (ref 3.5–5.2)
Sodium: 140 mmol/L (ref 134–144)
Total Protein: 8.7 g/dL — ABNORMAL HIGH (ref 6.0–8.5)

## 2018-04-08 LAB — HEPATITIS B CORE ANTIBODY, TOTAL: HEP B C TOTAL AB: NEGATIVE

## 2018-04-08 LAB — CERULOPLASMIN: Ceruloplasmin: 30.9 mg/dL (ref 19.0–39.0)

## 2018-04-08 LAB — PROTIME-INR
INR: 1 (ref 0.8–1.2)
Prothrombin Time: 10.7 s (ref 9.1–12.0)

## 2018-04-08 LAB — HEPATITIS C ANTIBODY

## 2018-04-08 LAB — MITOCHONDRIAL ANTIBODIES: Mitochondrial Ab: <20 Units (ref 0.0–20.0)

## 2018-04-08 LAB — HEPATITIS A ANTIBODY, TOTAL: Hep A Total Ab: POSITIVE — AB

## 2018-04-08 LAB — HEPATITIS B SURFACE ANTIGEN: Hepatitis B Surface Ag: NEGATIVE

## 2018-04-08 LAB — HEPATITIS B SURFACE ANTIBODY, QUANTITATIVE: Hepatitis B Surf Ab Quant: <3.1 m[IU]/mL — ABNORMAL LOW (ref >9.9)

## 2018-04-08 LAB — AFP TUMOR MARKER: AFP, SERUM, TUMOR MARKER: 3.1 ng/mL (ref 0.0–8.3)

## 2018-04-08 LAB — ANTI-SMOOTH MUSCLE ANTIBODY, IGG: SMOOTH MUSCLE AB: 12 U (ref 0–19)

## 2018-04-08 NOTE — Telephone Encounter (Signed)
Please let Omar know that she needs to be vaccinated for hep B. She can come in whenever she would like.

## 2018-04-10 ENCOUNTER — Ambulatory Visit: Admission: RE | Admit: 2018-04-10 | Payer: Medicare Other | Source: Ambulatory Visit

## 2018-04-11 NOTE — Telephone Encounter (Signed)
Called patient, no answer, will try again 

## 2018-04-15 NOTE — Telephone Encounter (Signed)
Message relayed to patient. Verbalized understanding and denied questions.   

## 2018-04-17 ENCOUNTER — Ambulatory Visit (INDEPENDENT_AMBULATORY_CARE_PROVIDER_SITE_OTHER): Payer: Medicare Other

## 2018-04-17 DIAGNOSIS — Z95810 Presence of automatic (implantable) cardiac defibrillator: Secondary | ICD-10-CM

## 2018-04-17 DIAGNOSIS — I5022 Chronic systolic (congestive) heart failure: Secondary | ICD-10-CM | POA: Diagnosis not present

## 2018-04-18 ENCOUNTER — Ambulatory Visit
Admission: RE | Admit: 2018-04-18 | Discharge: 2018-04-18 | Disposition: A | Discharge status: Home / Self Care | Payer: Medicare Other | Source: Ambulatory Visit | Attending: Family Medicine | Admitting: Family Medicine

## 2018-04-18 DIAGNOSIS — E042 Nontoxic multinodular goiter: Secondary | ICD-10-CM

## 2018-04-18 DIAGNOSIS — E041 Nontoxic single thyroid nodule: Secondary | ICD-10-CM | POA: Insufficient documentation

## 2018-04-18 DIAGNOSIS — E010 Iodine-deficiency related diffuse (endemic) goiter: Secondary | ICD-10-CM | POA: Diagnosis not present

## 2018-04-21 ENCOUNTER — Telehealth: Payer: Self-pay

## 2018-04-21 NOTE — Telephone Encounter (Signed)
Remote ICM transmission received.  Attempted call to patient and left detailed message, per DPR, regarding transmission and next ICM scheduled for 05/20/2018.  Advised to return call for any fluid symptoms or questions.    

## 2018-04-21 NOTE — Progress Notes (Signed)
EPIC Encounter for ICM Monitoring  Patient Name: Alaja Goldinger is a 52 y.o. female Date: 04/21/2018 Primary Care Physican: Valerie Roys, DO Primary Cardiologist: Sanjuana Letters, NP Electrophysiologist: Faustino Congress Weight: Previous weight210lbs       Attempted call to patient and unable to reach.  Left detailed message, per DPR, regarding transmission.  Transmission reviewed.    Thoracic impedance normal.  Prescribed dosage: Furosemide20 mg 1 tablet (74m total) daily.   Labs: 02/19/2018 Creatinine 1.16, BUN 17, Potassium 3.7, Sodium 142, EGFR 54-62 02/14/2018 Creatinine 1.39, BUN 21, Potassium 3.0, Sodium 138, EGFR 42-49  02/09/2018 Creatinine 1.68, BUN 19, Potassium 3.1, Sodium 137, EGFR 33-39  11/14/2017 Creatinine0.97, BUN19, Potassium3.1, Sodium139, EGFR>60 08/22/2017 Creatinine1.11, BUN16, Potassium4.5, Sodium142, [id number]  Recommendations:  Left voice mail with ICM number and encouraged to call if experiencing any fluid symptoms.  Follow-up plan: ICM clinic phone appointment on 05/20/2018.       Copy of ICM check sent to Dr. KCaryl Comes   3 month ICM trend: 04/17/2018    1 Year ICM trend:       LRosalene Billings RN 04/21/2018 12:32 PM

## 2018-04-22 ENCOUNTER — Telehealth: Payer: Self-pay | Admitting: Family Medicine

## 2018-04-22 DIAGNOSIS — E041 Nontoxic single thyroid nodule: Secondary | ICD-10-CM

## 2018-04-22 DIAGNOSIS — N289 Disorder of kidney and ureter, unspecified: Secondary | ICD-10-CM

## 2018-04-22 NOTE — Telephone Encounter (Signed)
Please have her come back in for recheck on her kidney function. Order in. Please also let her know that one of the nodules in her thyroid needs to be biopsied, so I've put in a new referral in for endocrine. Thanks!

## 2018-04-23 ENCOUNTER — Ambulatory Visit: Payer: Medicare Other | Admitting: Family

## 2018-04-23 ENCOUNTER — Encounter: Payer: Self-pay | Admitting: Family

## 2018-04-23 VITALS — BP 76/55 | HR 97 | Resp 18 | Ht 65.0 in | Wt 202.0 lb

## 2018-04-23 DIAGNOSIS — N183 Chronic kidney disease, stage 3 unspecified: Secondary | ICD-10-CM

## 2018-04-23 DIAGNOSIS — Z90710 Acquired absence of both cervix and uterus: Secondary | ICD-10-CM | POA: Diagnosis not present

## 2018-04-23 DIAGNOSIS — N289 Disorder of kidney and ureter, unspecified: Secondary | ICD-10-CM | POA: Diagnosis not present

## 2018-04-23 DIAGNOSIS — Z794 Long term (current) use of insulin: Secondary | ICD-10-CM

## 2018-04-23 DIAGNOSIS — K7460 Unspecified cirrhosis of liver: Secondary | ICD-10-CM | POA: Diagnosis not present

## 2018-04-23 DIAGNOSIS — I130 Hypertensive heart and chronic kidney disease with heart failure and stage 1 through stage 4 chronic kidney disease, or unspecified chronic kidney disease: Secondary | ICD-10-CM | POA: Diagnosis not present

## 2018-04-23 DIAGNOSIS — N19 Unspecified kidney failure: Secondary | ICD-10-CM | POA: Diagnosis not present

## 2018-04-23 DIAGNOSIS — I5022 Chronic systolic (congestive) heart failure: Secondary | ICD-10-CM | POA: Diagnosis not present

## 2018-04-23 DIAGNOSIS — I2510 Atherosclerotic heart disease of native coronary artery without angina pectoris: Secondary | ICD-10-CM | POA: Diagnosis not present

## 2018-04-23 DIAGNOSIS — E1122 Type 2 diabetes mellitus with diabetic chronic kidney disease: Secondary | ICD-10-CM

## 2018-04-23 DIAGNOSIS — I10 Essential (primary) hypertension: Secondary | ICD-10-CM

## 2018-04-23 DIAGNOSIS — N179 Acute kidney failure, unspecified: Secondary | ICD-10-CM | POA: Diagnosis not present

## 2018-04-23 LAB — GLUCOSE, CAPILLARY: Glucose-Capillary: 196 mg/dL — ABNORMAL HIGH (ref 70–99)

## 2018-04-23 NOTE — Patient Instructions (Addendum)
Continue weighing daily and call for an overnight weight gain of > 2 pounds or a weekly weight gain of >5 pounds.  Take entresto at breakfast and supper

## 2018-04-23 NOTE — Progress Notes (Signed)
Patient ID: Bonnie Ochoa, female    DOB: December 05, 1965, 52 y.o.   MRN: [id number]  HPI  Bonnie Ochoa is a 52 y/o female with a history of obstructive sleep apnea, restless leg syndrome, PTSD, HTN, DM, depression, CAD, cirrhosis, bipolar, asthma, marijuana use and chronic heart failure.   Echo report from 07/03/17 reviewed and showed an EF of 20-25%. Echo was done 05/28/16 and showed an EF of 20-25% along with moderate MR/TR and severely elevated PA pressure of 64 mm Hg.    Was in the ED 02/14/18 due to gastritis. Abdominal ultrasound negative for stones. Released same day. Was in the ED 02/09/18 due to abdominal pain. Abdominal/pelvic CT were negative. Released same day. Was in the ED 11/15/17 due to chest pain where she was evaluated and released.    She presents today for her follow-up visit with a chief complaint of minimal fatigue upon moderate exertion. She has associated light-headedness along with this. She denies any difficulty sleeping, abdominal distention, palpitations, pedal edema, chest pain or weight gain. She says that she has developed dizziness today and normally she doesn't have any dizziness.   Past Medical History:  Diagnosis Date  . ADHD   . Arthritis   . Asthma   . Bipolar 1 disorder ([place])   . CHF (congestive heart failure) ([place])   . Cirrhosis of liver ([place])   . Coronary artery disease   . Depression   . Diabetes mellitus without complication ([place])   . Diverticulitis   . Hypertension   . IBS (irritable bowel syndrome)   . Insomnia   . Migraines   . PTSD (post-traumatic stress disorder)   . Restless leg syndrome   . Sleep apnea   . Vertigo    Past Surgical History:  Procedure Laterality Date  . ABDOMINAL HYSTERECTOMY    . debribalator  2016  . INSERT / REPLACE / REMOVE PACEMAKER    . INSERTION OF ICD    . TONSILECTOMY, ADENOIDECTOMY, BILATERAL MYRINGOTOMY AND TUBES    . TONSILLECTOMY    . TONSILLECTOMY    . TUBAL LIGATION  1980  . TUBAL LIGATION     Family  History  Problem Relation Age of Onset  . Hypertension Mother   . Hyperlipidemia Mother   . Heart disease Mother   . Hypertension Sister   . Asthma Sister   . Heart disease Sister   . Diabetes Sister   . Cancer Sister   . Alzheimer's disease Maternal Grandfather   . Hyperlipidemia Brother   . Asthma Sister   . Hypertension Sister   . Diabetes Sister    Social History   Tobacco Use  . Smoking status: Former Smoker    Types: Cigarettes  . Smokeless tobacco: Never Used  . Tobacco comment: quit about 20 years ago   Substance Use Topics  . Alcohol use: No   Allergies  Allergen Reactions  . Levothyroxine Rash   Prior to Admission medications   Medication Sig Start Date End Date Taking? Authorizing Provider  atorvastatin (LIPITOR) 40 MG tablet TAKE 1 TABLET BY MOUTH EVERY DAY--NEEDS APPT BEFORE RUN OUT 12/24/17  Yes Gollan, Kathlene November, MD  blood glucose meter kit and supplies KIT Dispense based on patient and insurance preference. Use up to four times daily as directed. (FOR ICD-9 250.00, 250.01). 07/24/17  Yes Johnson, Megan P, DO  carvedilol (COREG) 6.25 MG tablet Take 1 tablet (6.25 mg total) by mouth 2 (two) times daily with a meal. 02/03/18  Yes  Minna Merritts, MD  dicyclomine (BENTYL) 20 MG tablet TAKE 1 TABLET (20 MG TOTAL) BY MOUTH 3 (THREE) TIMES DAILY AS NEEDED FOR SPASMS. 01/02/18  Yes Johnson, Megan P, DO  famotidine (PEPCID) 40 MG tablet Take 1 tablet (40 mg total) by mouth every evening. 02/14/18 02/14/19 Yes Nance Pear, MD  furosemide (LASIX) 20 MG tablet Take 1 tablet (20 mg total) by mouth daily. 12/23/17  Yes Gollan, Kathlene November, MD  Insulin Glargine (LANTUS SOLOSTAR) 100 UNIT/ML Solostar Pen Inject 40 Units into the skin daily at 10 pm. 02/19/18  Yes Johnson, Megan P, DO  Insulin Pen Needle 32G X 6 MM MISC 1 each by Does not apply route daily. 04/07/18  Yes Johnson, Megan P, DO  lamoTRIgine (LAMICTAL) 25 MG tablet Take 1 tablet (25 mg total) by mouth 2 (two) times  daily. 03/25/18  Yes [name], MD  metoCLOPramide (REGLAN) 10 MG tablet Take 1 tablet (10 mg total) by mouth 3 (three) times daily with meals. 02/09/18 02/09/19 Yes Gregor Hams, MD  neomycin-polymyxin-hydrocortisone (CORTISPORIN) OTIC solution Place 3 drops into the right ear 4 (four) times daily. 09/12/17  Yes Johnson, Megan P, DO  nitroGLYCERIN (NITROSTAT) 0.4 MG SL tablet Place 1 tablet (0.4 mg total) under the tongue every 5 (five) minutes as needed for chest pain. 06/04/16  Yes Gladstone Lighter, MD  ondansetron (ZOFRAN ODT) 4 MG disintegrating tablet Take 1 tablet (4 mg total) by mouth every 8 (eight) hours as needed for nausea or vomiting. 02/14/18  Yes Nance Pear, MD  pantoprazole (PROTONIX) 40 MG tablet Take 1 tablet (40 mg total) by mouth daily. 08/28/17 08/28/18 Yes [name], MD  PARoxetine (PAXIL) 30 MG tablet TAKE 1 TABLET BY MOUTH EVERY DAY 03/14/18  Yes Volney American, PA-C  potassium chloride SA (K-DUR,KLOR-CON) 20 MEQ tablet Take 1 tablet (20 mEq total) by mouth as directed. Take with fluid pill and as directed 12/09/17  Yes Gollan, Kathlene November, MD  QUEtiapine (SEROQUEL) 300 MG tablet TAKE 1 TABLET BY MOUTH EVERYDAY AT BEDTIME 03/14/18  Yes Volney American, PA-C  sacubitril-valsartan (ENTRESTO) 49-51 MG Take 1 tablet by mouth 2 (two) times daily. 11/22/17  Yes Genola Yuille, Otila Kluver A, FNP  spironolactone (ALDACTONE) 25 MG tablet TAKE 1 TABLET (25 MG TOTAL) BY MOUTH DAILY. 07/13/17  Yes [provider]  sucralfate (CARAFATE) 1 g tablet TAKE 1 TABLET BY MOUTH 4 TIMES A DAY 03/27/18  Yes Johnson, Megan P, DO    Review of Systems  Constitutional: Positive for fatigue ("better"). Negative for appetite change.  HENT: Negative for congestion, ear pain, nosebleeds and sore throat.   Eyes: Negative.   Respiratory: Negative for chest tightness and shortness of breath.   Cardiovascular: Negative for chest pain, palpitations and leg swelling.  Gastrointestinal: Negative  for abdominal distention and abdominal pain.  Endocrine: Negative.   Genitourinary: Negative.   Musculoskeletal: Negative for arthralgias and neck pain.  Skin: Negative.   Allergic/Immunologic: Negative.   Neurological: Positive for light-headedness (today). Negative for dizziness.  Hematological: Negative for adenopathy. Does not bruise/bleed easily.  Psychiatric/Behavioral: Negative for dysphoric mood, sleep disturbance (sleeping on 2 pillows) and suicidal ideas. The patient is not nervous/anxious.    Vitals:   04/23/18 1347  BP: (!) 76/55  Pulse: 97  Resp: 18  SpO2: 98%  Weight: 202 lb (91.6 kg)  Height: 5' 5"  (1.651 m)   Wt Readings from Last 3 Encounters:  04/23/18 202 lb (91.6 kg)  04/07/18 208 lb (94.3  kg)  03/13/18 203 lb (92.1 kg)   Lab Results  Component Value Date   CREATININE 1.67 (H) 04/07/2018   CREATININE 1.16 (H) 02/19/2018   CREATININE 1.39 (H) 02/14/2018    Physical Exam  Constitutional: She is oriented to person, place, and time. She appears well-developed and well-nourished.  HENT:  Head: Normocephalic and atraumatic.  Neck: Normal range of motion. Neck supple. No JVD present.  Cardiovascular: Regular rhythm. Tachycardia present.  Pulmonary/Chest: Effort normal. She has no wheezes. She has no rales.  Abdominal: Soft. She exhibits no distension. There is no tenderness.  Musculoskeletal: She exhibits no edema or tenderness.  Neurological: She is alert and oriented to person, place, and time.  Skin: Skin is warm and dry.  Psychiatric: She has a normal mood and affect. Her behavior is normal. Thought content normal.  Nursing note and vitals reviewed.  Assessment & Plan:  1: Chronic heart failure with reduced ejection fraction- - NYHA class II - euvolemic - weighing daily; reminded to call for an overnight weight gain of >2 pounds or a weekly weight gain of >5 pounds - weight down 8 pounds since she was last here - not adding salt to her food and is  trying to eat low sodium foods. Continues to read food labels so that she can keep her sodium intake to <2052m daily. - walking daily about a mile - Has been trying to drink around 5-6 cups (8oz) of water a day. Discussed the importance of maintaining her fluid intake between 40-48 ounces of fluid daily.  - unable to titrate medications due to today's BP - saw cardiologist (Rockey Situ 12/09/17 - saw EP (Caryl Comes 08/01/17 - BNP 09/17/16 was >4500.0 - has received her flu vaccine for this season already  2: HTN- - BP low today (76/55) - was going to hold her entresto but she says that she's already taken both doses today. She says that she took her morning meds ~ 9am and took the evening doses ~ noon because she was afraid she would forget to take them  - explained that her twice daily medications should be taken around breakfast and again at supper - low BP today is probably because she took her medications too closely together; encouraged slow position changes today - saw PCP (Wynetta Emery 04/07/18  - BMP from 04/07/18 reviewed and showed sodium 140, potassium 4.3, creatinine 1.67 and GFR 40  3: Diabetes- - nonfasting glucose in clinic was 196 - saw nephrologist 04/10/17 - A1c on 02/19/18 was 7.8%  Patient did not bring her medications nor a list. Each medication was verbally reviewed with the patient and she was encouraged to bring the bottles to every visit to confirm accuracy of list.  Return in 4 months or sooner for any questions/problems before then.

## 2018-04-23 NOTE — Telephone Encounter (Signed)
Patient notified

## 2018-04-24 ENCOUNTER — Other Ambulatory Visit: Payer: Self-pay

## 2018-04-24 ENCOUNTER — Other Ambulatory Visit: Payer: Medicare Other

## 2018-04-24 ENCOUNTER — Encounter: Payer: Self-pay | Admitting: Family

## 2018-04-24 DIAGNOSIS — N289 Disorder of kidney and ureter, unspecified: Secondary | ICD-10-CM | POA: Diagnosis not present

## 2018-04-24 LAB — CUP PACEART REMOTE DEVICE CHECK
Battery Remaining Percentage: 72 %
Date Time Interrogation Session: 20190801144017
HIGH POWER IMPEDANCE MEASURED VALUE: 73 Ohm
HighPow Impedance: 73 Ohm
Implantable Lead Location: 753860
Implantable Pulse Generator Implant Date: 20160410
Lead Channel Impedance Value: 430 Ohm
Lead Channel Pacing Threshold Pulse Width: 0.5 ms
Lead Channel Setting Pacing Amplitude: 2.5 V
Lead Channel Setting Pacing Pulse Width: 0.5 ms
Lead Channel Setting Sensing Sensitivity: 0.5 mV
MDC IDC LEAD IMPLANT DT: 20160410
MDC IDC MSMT BATTERY REMAINING LONGEVITY: 80 mo
MDC IDC MSMT BATTERY VOLTAGE: 3.02 V
MDC IDC MSMT LEADCHNL RV PACING THRESHOLD AMPLITUDE: 1 V
MDC IDC MSMT LEADCHNL RV SENSING INTR AMPL: 12 mV
MDC IDC STAT BRADY RV PERCENT PACED: 1 %
Pulse Gen Serial Number: 7259964

## 2018-04-25 ENCOUNTER — Telehealth: Payer: Self-pay | Admitting: Family Medicine

## 2018-04-25 ENCOUNTER — Other Ambulatory Visit: Payer: Self-pay

## 2018-04-25 ENCOUNTER — Inpatient Hospital Stay: Payer: Medicare Other

## 2018-04-25 ENCOUNTER — Emergency Department: Payer: Medicare Other

## 2018-04-25 ENCOUNTER — Encounter: Payer: Self-pay | Admitting: *Deleted

## 2018-04-25 ENCOUNTER — Inpatient Hospital Stay
Admission: EM | Admit: 2018-04-25 | Discharge: 2018-04-27 | DRG: 683 | Disposition: A | Discharge status: Home / Self Care | Payer: Medicare Other | Attending: Internal Medicine | Admitting: Internal Medicine

## 2018-04-25 DIAGNOSIS — F319 Bipolar disorder, unspecified: Secondary | ICD-10-CM | POA: Diagnosis present

## 2018-04-25 DIAGNOSIS — E119 Type 2 diabetes mellitus without complications: Secondary | ICD-10-CM | POA: Diagnosis not present

## 2018-04-25 DIAGNOSIS — G2581 Restless legs syndrome: Secondary | ICD-10-CM | POA: Diagnosis present

## 2018-04-25 DIAGNOSIS — E1122 Type 2 diabetes mellitus with diabetic chronic kidney disease: Secondary | ICD-10-CM | POA: Diagnosis present

## 2018-04-25 DIAGNOSIS — N179 Acute kidney failure, unspecified: Principal | ICD-10-CM | POA: Diagnosis present

## 2018-04-25 DIAGNOSIS — N289 Disorder of kidney and ureter, unspecified: Secondary | ICD-10-CM

## 2018-04-25 DIAGNOSIS — K589 Irritable bowel syndrome without diarrhea: Secondary | ICD-10-CM | POA: Diagnosis present

## 2018-04-25 DIAGNOSIS — E876 Hypokalemia: Secondary | ICD-10-CM | POA: Diagnosis present

## 2018-04-25 DIAGNOSIS — Z794 Long term (current) use of insulin: Secondary | ICD-10-CM | POA: Diagnosis not present

## 2018-04-25 DIAGNOSIS — K219 Gastro-esophageal reflux disease without esophagitis: Secondary | ICD-10-CM | POA: Diagnosis present

## 2018-04-25 DIAGNOSIS — Z90710 Acquired absence of both cervix and uterus: Secondary | ICD-10-CM

## 2018-04-25 DIAGNOSIS — K7460 Unspecified cirrhosis of liver: Secondary | ICD-10-CM | POA: Diagnosis present

## 2018-04-25 DIAGNOSIS — F909 Attention-deficit hyperactivity disorder, unspecified type: Secondary | ICD-10-CM | POA: Diagnosis present

## 2018-04-25 DIAGNOSIS — D631 Anemia in chronic kidney disease: Secondary | ICD-10-CM | POA: Diagnosis present

## 2018-04-25 DIAGNOSIS — F4310 Post-traumatic stress disorder, unspecified: Secondary | ICD-10-CM | POA: Diagnosis present

## 2018-04-25 DIAGNOSIS — Z79899 Other long term (current) drug therapy: Secondary | ICD-10-CM

## 2018-04-25 DIAGNOSIS — E785 Hyperlipidemia, unspecified: Secondary | ICD-10-CM | POA: Diagnosis present

## 2018-04-25 DIAGNOSIS — N183 Chronic kidney disease, stage 3 (moderate): Secondary | ICD-10-CM | POA: Diagnosis present

## 2018-04-25 DIAGNOSIS — Z87891 Personal history of nicotine dependence: Secondary | ICD-10-CM

## 2018-04-25 DIAGNOSIS — G4733 Obstructive sleep apnea (adult) (pediatric): Secondary | ICD-10-CM | POA: Diagnosis present

## 2018-04-25 DIAGNOSIS — I5022 Chronic systolic (congestive) heart failure: Secondary | ICD-10-CM | POA: Diagnosis present

## 2018-04-25 DIAGNOSIS — I130 Hypertensive heart and chronic kidney disease with heart failure and stage 1 through stage 4 chronic kidney disease, or unspecified chronic kidney disease: Secondary | ICD-10-CM | POA: Diagnosis present

## 2018-04-25 DIAGNOSIS — I2510 Atherosclerotic heart disease of native coronary artery without angina pectoris: Secondary | ICD-10-CM | POA: Diagnosis present

## 2018-04-25 DIAGNOSIS — N19 Unspecified kidney failure: Secondary | ICD-10-CM | POA: Diagnosis not present

## 2018-04-25 DIAGNOSIS — I959 Hypotension, unspecified: Secondary | ICD-10-CM | POA: Diagnosis present

## 2018-04-25 LAB — COMPREHENSIVE METABOLIC PANEL
ALK PHOS: 123 U/L (ref 38–126)
ALT: 18 U/L (ref 0–44)
ANION GAP: 14 (ref 5–15)
AST: 20 U/L (ref 15–41)
Albumin: 4.8 g/dL (ref 3.5–5.0)
BUN: 88 mg/dL — ABNORMAL HIGH (ref 6–20)
CALCIUM: 9.9 mg/dL (ref 8.9–10.3)
CO2: 28 mmol/L (ref 22–32)
Chloride: 90 mmol/L — ABNORMAL LOW (ref 98–111)
Creatinine, Ser: 3.7 mg/dL — ABNORMAL HIGH (ref 0.44–1.00)
GFR calc non Af Amer: 13 mL/min — ABNORMAL LOW (ref >60)
GFR, EST AFRICAN AMERICAN: 15 mL/min — AB (ref >60)
Glucose, Bld: 248 mg/dL — ABNORMAL HIGH (ref 70–99)
POTASSIUM: 2.4 mmol/L — AB (ref 3.5–5.1)
SODIUM: 132 mmol/L — AB (ref 135–145)
Total Bilirubin: 0.9 mg/dL (ref 0.3–1.2)
Total Protein: 9.5 g/dL — ABNORMAL HIGH (ref 6.5–8.1)

## 2018-04-25 LAB — CBC
HCT: 36.1 % (ref 35.0–47.0)
HEMOGLOBIN: 12.6 g/dL (ref 12.0–16.0)
MCH: 31.3 pg (ref 26.0–34.0)
MCHC: 34.8 g/dL (ref 32.0–36.0)
MCV: 90 fL (ref 80.0–100.0)
Platelets: 258 10*3/uL (ref 150–440)
RBC: 4.01 MIL/uL (ref 3.80–5.20)
RDW: 13.2 % (ref 11.5–14.5)
WBC: 7.7 10*3/uL (ref 3.6–11.0)

## 2018-04-25 LAB — GLUCOSE, CAPILLARY: Glucose-Capillary: 202 mg/dL — ABNORMAL HIGH (ref 70–99)

## 2018-04-25 LAB — BASIC METABOLIC PANEL
BUN/Creatinine Ratio: 27 — ABNORMAL HIGH (ref 9–23)
BUN: 86 mg/dL (ref 6–24)
CO2: 27 mmol/L (ref 20–29)
CREATININE: 3.23 mg/dL — AB (ref 0.57–1.00)
Calcium: 10 mg/dL (ref 8.7–10.2)
Chloride: 90 mmol/L — ABNORMAL LOW (ref 96–106)
GFR calc Af Amer: 18 mL/min/{1.73_m2} — ABNORMAL LOW (ref >59)
GFR calc non Af Amer: 16 mL/min/{1.73_m2} — ABNORMAL LOW (ref >59)
GLUCOSE: 207 mg/dL — AB (ref 65–99)
POTASSIUM: 3 mmol/L — AB (ref 3.5–5.2)
SODIUM: 138 mmol/L (ref 134–144)

## 2018-04-25 LAB — LIPASE, BLOOD: Lipase: 269 U/L — ABNORMAL HIGH (ref 11–51)

## 2018-04-25 MED ORDER — ATORVASTATIN CALCIUM 20 MG PO TABS
40.0000 mg | ORAL_TABLET | Freq: Every day | ORAL | Status: DC
  Administered 2018-04-25 – 2018-04-26 (×2): 40 mg via ORAL
  Filled 2018-04-25 (×2): qty 2

## 2018-04-25 MED ORDER — QUETIAPINE FUMARATE 300 MG PO TABS
300.0000 mg | ORAL_TABLET | Freq: Every day | ORAL | Status: DC
  Administered 2018-04-25 – 2018-04-27 (×3): 300 mg via ORAL
  Filled 2018-04-25 (×3): qty 1

## 2018-04-25 MED ORDER — LAMOTRIGINE 25 MG PO TABS
25.0000 mg | ORAL_TABLET | Freq: Two times a day (BID) | ORAL | Status: DC
  Administered 2018-04-25 – 2018-04-27 (×4): 25 mg via ORAL
  Filled 2018-04-25 (×4): qty 1

## 2018-04-25 MED ORDER — FAMOTIDINE 40 MG PO TABS: 40.0000 mg | ORAL_TABLET | Freq: Every evening | ORAL | 1 refills | Status: DC

## 2018-04-25 MED ORDER — PAROXETINE HCL 30 MG PO TABS
30.0000 mg | ORAL_TABLET | Freq: Every day | ORAL | Status: DC
  Administered 2018-04-26 – 2018-04-27 (×2): 30 mg via ORAL
  Filled 2018-04-25 (×2): qty 1

## 2018-04-25 MED ORDER — PANTOPRAZOLE SODIUM 40 MG PO TBEC
40.0000 mg | DELAYED_RELEASE_TABLET | Freq: Every day | ORAL | Status: DC
  Administered 2018-04-26 – 2018-04-27 (×2): 40 mg via ORAL
  Filled 2018-04-25 (×2): qty 1

## 2018-04-25 MED ORDER — INSULIN ASPART 100 UNIT/ML ~~LOC~~ SOLN
0.0000 [IU] | Freq: Three times a day (TID) | SUBCUTANEOUS | Status: DC
  Administered 2018-04-26: 3 [IU] via SUBCUTANEOUS
  Administered 2018-04-26 – 2018-04-27 (×3): 2 [IU] via SUBCUTANEOUS
  Filled 2018-04-25 (×4): qty 1

## 2018-04-25 MED ORDER — SUCRALFATE 1 G PO TABS
1.0000 g | ORAL_TABLET | Freq: Four times a day (QID) | ORAL | Status: DC
  Administered 2018-04-25 – 2018-04-27 (×6): 1 g via ORAL
  Filled 2018-04-25 (×6): qty 1

## 2018-04-25 MED ORDER — ONDANSETRON HCL 4 MG/2ML IJ SOLN: 4.0000 mg | Freq: Four times a day (QID) | INTRAMUSCULAR | Status: DC | PRN

## 2018-04-25 MED ORDER — ATORVASTATIN CALCIUM 40 MG PO TABS: ORAL_TABLET | ORAL | 1 refills | Status: DC

## 2018-04-25 MED ORDER — HEPARIN SODIUM (PORCINE) 5000 UNIT/ML IJ SOLN
5000.0000 [IU] | Freq: Three times a day (TID) | INTRAMUSCULAR | Status: DC
  Administered 2018-04-25 – 2018-04-27 (×4): 5000 [IU] via SUBCUTANEOUS
  Filled 2018-04-25 (×4): qty 1

## 2018-04-25 MED ORDER — SODIUM CHLORIDE 0.9 % IV BOLUS
500.0000 mL | Freq: Once | INTRAVENOUS | Status: AC
  Administered 2018-04-25: 500 mL via INTRAVENOUS

## 2018-04-25 MED ORDER — METOCLOPRAMIDE HCL 10 MG PO TABS
10.0000 mg | ORAL_TABLET | Freq: Three times a day (TID) | ORAL | Status: DC
  Administered 2018-04-26 – 2018-04-27 (×4): 10 mg via ORAL
  Filled 2018-04-25 (×4): qty 1

## 2018-04-25 MED ORDER — SODIUM CHLORIDE 0.9 % IV SOLN
INTRAVENOUS | Status: DC
  Administered 2018-04-26 – 2018-04-27 (×3): via INTRAVENOUS

## 2018-04-25 MED ORDER — ONDANSETRON 4 MG PO TBDP: 4.0000 mg | ORAL_TABLET | Freq: Three times a day (TID) | ORAL | Status: DC | PRN

## 2018-04-25 MED ORDER — ONDANSETRON HCL 4 MG PO TABS: 4.0000 mg | ORAL_TABLET | Freq: Four times a day (QID) | ORAL | Status: DC | PRN

## 2018-04-25 MED ORDER — INSULIN GLARGINE 100 UNIT/ML ~~LOC~~ SOLN
20.0000 [IU] | Freq: Every day | SUBCUTANEOUS | Status: DC
  Administered 2018-04-25 – 2018-04-27 (×3): 20 [IU] via SUBCUTANEOUS
  Filled 2018-04-25 (×3): qty 0.2

## 2018-04-25 MED ORDER — POTASSIUM CHLORIDE 10 MEQ/100ML IV SOLN
10.0000 meq | Freq: Once | INTRAVENOUS | Status: AC
  Administered 2018-04-25: 10 meq via INTRAVENOUS
  Filled 2018-04-25: qty 100

## 2018-04-25 MED ORDER — ACETAMINOPHEN 650 MG RE SUPP: 650.0000 mg | Freq: Four times a day (QID) | RECTAL | Status: DC | PRN

## 2018-04-25 MED ORDER — FAMOTIDINE 20 MG PO TABS
40.0000 mg | ORAL_TABLET | Freq: Every evening | ORAL | Status: DC
  Administered 2018-04-25: 40 mg via ORAL
  Filled 2018-04-25: qty 2

## 2018-04-25 MED ORDER — ACETAMINOPHEN 325 MG PO TABS: 650.0000 mg | ORAL_TABLET | Freq: Four times a day (QID) | ORAL | Status: DC | PRN

## 2018-04-25 NOTE — ED Triage Notes (Addendum)
Pt to ED reporting primary care told her to come to the ED after checking her renal function. Pt unsure which lab values were abnormal. Pt denies changes in urine but intermittent left flank pain and abd tightness.   Pt hypotensive in triage and reports he was hypotensive 2 days ago as well.

## 2018-04-25 NOTE — Telephone Encounter (Signed)
Please let her know that her kidney function has gotten much worse and she is in acute renal failure. I need her to go to the ER for evaluation. Please make sure she's going to go and call the ER to let them know she's on her way.

## 2018-04-25 NOTE — ED Notes (Signed)
Pt to ultrasound at this time, via stretcher. 

## 2018-04-25 NOTE — ED Notes (Signed)
Pt can no urinate at this time but has cup and is aware a sample is needed.

## 2018-04-25 NOTE — H&P (Signed)
[place] [facility] [place] NAME: Bonnie Ochoa    MR#:  [id number]  DATE OF BIRTH:  [birth year]  DATE OF ADMISSION:  04/25/2018  PRIMARY CARE PHYSICIAN: Valerie Roys, DO   REQUESTING/REFERRING PHYSICIAN: Arta Silence, MD  CHIEF COMPLAINT:   Chief Complaint  Patient presents with  . irregular labs  . Flank Pain    HISTORY OF PRESENT ILLNESS: Bonnie Ochoa  is a 52 y.o. female with a known history of bipolar 1 disorder, cirrhosis of the liver, systolic CHF, coronary artery disease, diabetes who is presenting to the ED with abnormal labs as well as dizziness.  Patient states that she went to see her primary care provider last month and had some blood work done she was told to return back for follow-up she had further blood work done again which shows a creatinine is elevated.  She was told to come to the ED.  In reviewing her vitals it is noted that she has been having low blood pressure.  On 9/11 her blood pressure was 97X systolic.  Patient states that there are no changes in her medication she denies any shortness of breath  PAST MEDICAL HISTORY:   Past Medical History:  Diagnosis Date  . ADHD   . Arthritis   . Asthma   . Bipolar 1 disorder ([place])   . CHF (congestive heart failure) ([place])   . Cirrhosis of liver ([place])   . Coronary artery disease   . Depression   . Diabetes mellitus without complication ([place])   . Diverticulitis   . Hypertension   . IBS (irritable bowel syndrome)   . Insomnia   . Migraines   . PTSD (post-traumatic stress disorder)   . Restless leg syndrome   . Sleep apnea   . Vertigo     PAST SURGICAL HISTORY:  Past Surgical History:  Procedure Laterality Date  . ABDOMINAL HYSTERECTOMY    . debribalator  2016  . INSERT / REPLACE / REMOVE PACEMAKER    . INSERTION OF ICD    . TONSILECTOMY, ADENOIDECTOMY, BILATERAL MYRINGOTOMY AND TUBES    . TONSILLECTOMY    . TONSILLECTOMY    . TUBAL LIGATION  1980  . TUBAL  LIGATION      SOCIAL HISTORY:  Social History   Tobacco Use  . Smoking status: Former Smoker    Types: Cigarettes  . Smokeless tobacco: Never Used  . Tobacco comment: quit about 20 years ago   Substance Use Topics  . Alcohol use: Not Currently    FAMILY HISTORY:  Family History  Problem Relation Age of Onset  . Hypertension Mother   . Hyperlipidemia Mother   . Heart disease Mother   . Hypertension Sister   . Asthma Sister   . Heart disease Sister   . Diabetes Sister   . Cancer Sister   . Alzheimer's disease Maternal Grandfather   . Hyperlipidemia Brother   . Asthma Sister   . Hypertension Sister   . Diabetes Sister     DRUG ALLERGIES:  Allergies  Allergen Reactions  . Levothyroxine Rash    REVIEW OF SYSTEMS:   CONSTITUTIONAL: No fever, fatigue or positive weakness.  EYES: No blurred or double vision.  EARS, NOSE, AND THROAT: No tinnitus or ear pain.  RESPIRATORY: No cough, shortness of breath, wheezing or hemoptysis.  CARDIOVASCULAR: No chest pain, orthopnea, edema.  GASTROINTESTINAL: No nausea, vomiting, diarrhea or abdominal pain.  GENITOURINARY: No dysuria, hematuria.  ENDOCRINE: No  polyuria, nocturia,  HEMATOLOGY: No anemia, easy bruising or bleeding SKIN: No rash or lesion. MUSCULOSKELETAL: No joint pain or arthritis.   NEUROLOGIC: No tingling, numbness, weakness.  PSYCHIATRY: No anxiety or depression.   MEDICATIONS AT HOME:  Prior to Admission medications   Medication Sig Start Date End Date Taking? Authorizing Provider  atorvastatin (LIPITOR) 40 MG tablet TAKE 1 TABLET BY MOUTH EVERY DAY--NEEDS APPT BEFORE RUN OUT 04/25/18  Yes Johnson, Megan P, DO  carvedilol (COREG) 6.25 MG tablet Take 1 tablet (6.25 mg total) by mouth 2 (two) times daily with a meal. 02/03/18  Yes Gollan, Kathlene November, MD  famotidine (PEPCID) 40 MG tablet Take 1 tablet (40 mg total) by mouth every evening. 04/25/18 04/25/19 Yes Johnson, Megan P, DO  furosemide (LASIX) 20 MG tablet Take  1 tablet (20 mg total) by mouth daily. 12/23/17  Yes Gollan, Kathlene November, MD  Insulin Glargine (LANTUS SOLOSTAR) 100 UNIT/ML Solostar Pen Inject 40 Units into the skin daily at 10 pm. 02/19/18  Yes Johnson, Megan P, DO  lamoTRIgine (LAMICTAL) 25 MG tablet Take 1 tablet (25 mg total) by mouth 2 (two) times daily. 03/25/18  Yes Eappen, Ria Clock, MD  pantoprazole (PROTONIX) 40 MG tablet Take 1 tablet (40 mg total) by mouth daily. 08/28/17 08/28/18 Yes [name], MD  PARoxetine (PAXIL) 30 MG tablet TAKE 1 TABLET BY MOUTH EVERY DAY 03/14/18  Yes Volney American, PA-C  potassium chloride SA (K-DUR,KLOR-CON) 20 MEQ tablet Take 1 tablet (20 mEq total) by mouth as directed. Take with fluid pill and as directed 12/09/17  Yes Gollan, Kathlene November, MD  QUEtiapine (SEROQUEL) 300 MG tablet TAKE 1 TABLET BY MOUTH EVERYDAY AT BEDTIME 03/14/18  Yes Volney American, PA-C  sacubitril-valsartan (ENTRESTO) 49-51 MG Take 1 tablet by mouth 2 (two) times daily. 11/22/17  Yes Hackney, Otila Kluver A, FNP  spironolactone (ALDACTONE) 25 MG tablet TAKE 1 TABLET (25 MG TOTAL) BY MOUTH DAILY. 07/13/17  Yes [provider]  sucralfate (CARAFATE) 1 g tablet TAKE 1 TABLET BY MOUTH 4 TIMES A DAY 03/27/18  Yes Johnson, Megan P, DO  blood glucose meter kit and supplies KIT Dispense based on patient and insurance preference. Use up to four times daily as directed. (FOR ICD-9 250.00, 250.01). 07/24/17   Wynetta Emery, Megan P, DO  dicyclomine (BENTYL) 20 MG tablet TAKE 1 TABLET (20 MG TOTAL) BY MOUTH 3 (THREE) TIMES DAILY AS NEEDED FOR SPASMS. Patient not taking: Reported on 04/25/2018 01/02/18   Park Liter P, DO  Insulin Pen Needle 32G X 6 MM MISC 1 each by Does not apply route daily. 04/07/18   Johnson, Megan P, DO  metoCLOPramide (REGLAN) 10 MG tablet Take 1 tablet (10 mg total) by mouth 3 (three) times daily with meals. 02/09/18 02/09/19  Gregor Hams, MD  neomycin-polymyxin-hydrocortisone (CORTISPORIN) OTIC solution Place 3 drops into  the right ear 4 (four) times daily. Patient not taking: Reported on 04/25/2018 09/12/17   Park Liter P, DO  nitroGLYCERIN (NITROSTAT) 0.4 MG SL tablet Place 1 tablet (0.4 mg total) under the tongue every 5 (five) minutes as needed for chest pain. 06/04/16   Gladstone Lighter, MD  ondansetron (ZOFRAN ODT) 4 MG disintegrating tablet Take 1 tablet (4 mg total) by mouth every 8 (eight) hours as needed for nausea or vomiting. 02/14/18   Nance Pear, MD      PHYSICAL EXAMINATION:   VITAL SIGNS: Blood pressure 107/70, pulse (!) 59, temperature 97.9 F (36.6 C), temperature source Oral, resp.  rate 10, SpO2 97 %.  GENERAL:  52 y.o.-year-old patient lying in the bed with no acute distress.  EYES: Pupils equal, round, reactive to light and accommodation. No scleral icterus. Extraocular muscles intact.  HEENT: Head atraumatic, normocephalic. Oropharynx and nasopharynx clear.  NECK:  Supple, no jugular venous distention. No thyroid enlargement, no tenderness.  LUNGS: Normal breath sounds bilaterally, no wheezing, rales,rhonchi or crepitation. No use of accessory muscles of respiration.  CARDIOVASCULAR: S1, S2 normal. No murmurs, rubs, or gallops.  ABDOMEN: Soft, nontender, nondistended. Bowel sounds present. No organomegaly or mass.  EXTREMITIES: No pedal edema, cyanosis, or clubbing.  NEUROLOGIC: Cranial nerves II through XII are intact. Muscle strength 5/5 in all extremities. Sensation intact. Gait not checked.  PSYCHIATRIC: The patient is alert and oriented x 3.  SKIN: No obvious rash, lesion, or ulcer.   LABORATORY PANEL:   CBC Recent Labs  Lab 04/25/18 1603  WBC 7.7  HGB 12.6  HCT 36.1  PLT 258  MCV 90.0  MCH 31.3  MCHC 34.8  RDW 13.2   ------------------------------------------------------------------------------------------------------------------  Chemistries  Recent Labs  Lab 04/24/18 1508 04/25/18 1603  NA 138 132*  K 3.0* 2.4*  CL 90* 90*  CO2 27 28  GLUCOSE 207*  248*  BUN 86* 88*  CREATININE 3.23* 3.70*  CALCIUM 10.0 9.9  AST  --  20  ALT  --  18  ALKPHOS  --  123  BILITOT  --  0.9   ------------------------------------------------------------------------------------------------------------------ estimated creatinine clearance is 19.4 mL/min (A) (by C-G formula based on SCr of 3.7 mg/dL (H)). ------------------------------------------------------------------------------------------------------------------ No results for input(s): TSH, T4TOTAL, T3FREE, THYROIDAB in the last 72 hours.  Invalid input(s): FREET3   Coagulation profile No results for input(s): INR, PROTIME in the last 168 hours. ------------------------------------------------------------------------------------------------------------------- No results for input(s): DDIMER in the last 72 hours. -------------------------------------------------------------------------------------------------------------------  Cardiac Enzymes No results for input(s): CKMB, TROPONINI, MYOGLOBIN in the last 168 hours.  Invalid input(s): CK ------------------------------------------------------------------------------------------------------------------ Invalid input(s): POCBNP  ---------------------------------------------------------------------------------------------------------------  Urinalysis    Component Value Date/Time   COLORURINE STRAW (A) 02/09/2018 0458   APPEARANCEUR Hazy (A) 02/19/2018 1127   LABSPEC 1.014 02/09/2018 0458   PHURINE 6.0 02/09/2018 0458   GLUCOSEU Negative 02/19/2018 1127   HGBUR NEGATIVE 02/09/2018 0458   BILIRUBINUR Negative 02/19/2018 1127   KETONESUR NEGATIVE 02/09/2018 0458   PROTEINUR 2+ (A) 02/19/2018 1127   PROTEINUR NEGATIVE 02/09/2018 0458   NITRITE Negative 02/19/2018 1127   NITRITE NEGATIVE 02/09/2018 0458   LEUKOCYTESUR Negative 02/19/2018 1127     RADIOLOGY: No results found.  EKG: Orders placed or performed during the hospital  encounter of 04/25/18  . EKG 12-Lead  . EKG 12-Lead    IMPRESSION AND PLAN: Patient is 52 year old with history of CHF now presenting with acute renal failure  1.  Acute renal failure suspect due to overdiuresis and hypotension Possible ATN related to hypotension We will give her gentle IV fluids monitor renal function Renal ultrasound Nephrology consult  2.  Hypokalemia we will replace her potassium monitor on telemetry  3.  Diabetes type 2 placed on sliding scale insulin I will give her 20 units of Lantus instead of 40 units  4.  Chronic systolic CHF will have to hold all her medications due to hypotension acute renal failure  5.  Hyperlipidemia continue atorvastatin  6.  GERD continue Protonix  7.  Depression continue Paxil    All the records are reviewed and case discussed with ED provider. Management plans discussed with the  patient, family and they are in agreement.  CODE STATUS: Code Status History    Date Active Date Inactive Code Status Order ID Comments User Context   07/03/2017 0100 07/05/2017 1806 Full Code [id number]  Harrie Foreman, MD Inpatient   09/17/2016 1712 09/18/2016 2025 Full Code [id number]  Fritzi Mandes, MD Inpatient   09/10/2016 1105 09/11/2016 2045 Full Code [id number]  Dustin Flock, MD Inpatient   05/30/2016 1129 06/04/2016 2226 Full Code [id number]  Fritzi Mandes, MD Inpatient       TOTAL TIME TAKING CARE OF THIS PATIENT: 55  minutes.    Dustin Flock M.D on 04/25/2018 at 7:52 PM  Between 7am to 6pm - Pager - [phone]  After 6pm go to [email or web address] - Proofreader  [facility]  [phone]  CC: Primary care physician; Valerie Roys, DO

## 2018-04-25 NOTE — Telephone Encounter (Signed)
Pt would like a refill for atorvastatin and famotidine sent to [facility].

## 2018-04-25 NOTE — Telephone Encounter (Signed)
Patient notified, she states that she has to pick her wife up at 3:30 then she will go.

## 2018-04-25 NOTE — ED Provider Notes (Signed)
[facility] Emergency Department Provider Note ____________________________________________   First MD Initiated Contact with Patient 04/25/18 1813     (approximate)  I have reviewed the triage vital signs and the nursing notes.   HISTORY  Chief Complaint irregular labs and Flank Pain    HPI Bonnie Ochoa is a 52 y.o. female PMH as noted below including CHF on Lasix who presents for evaluation of abnormal labs.  The patient states that she was just told to come to the hospital.  From the notes from her PMD I see that she was found to have significantly elevated creatinine from her baseline on routine labs drawn yesterday.  The patient states that she has been having generalized fatigue and dizziness over the last several days but denies other acute symptoms.   Past Medical History:  Diagnosis Date  . ADHD   . Arthritis   . Asthma   . Bipolar 1 disorder ([place])   . CHF (congestive heart failure) ([place])   . Cirrhosis of liver ([place])   . Coronary artery disease   . Depression   . Diabetes mellitus without complication ([place])   . Diverticulitis   . Hypertension   . IBS (irritable bowel syndrome)   . Insomnia   . Migraines   . PTSD (post-traumatic stress disorder)   . Restless leg syndrome   . Sleep apnea   . Vertigo     Patient Active Problem List   Diagnosis Date Noted  . Hypothyroid 04/30/2017  . HTN (hypertension) 03/11/2017  . Multiple thyroid nodules 01/29/2017  . Chronic systolic heart failure ([place]) 10/08/2016  . Obstructive sleep apnea 10/08/2016  . Noncompliance 09/18/2016  . Cirrhosis ([place]) 09/18/2016  . Pulmonary hypertension ([place]) 05/30/2016  . Hypokalemia 05/30/2016  . NICM (nonischemic cardiomyopathy) ([place]) 05/30/2016  . Other ascites   . Benign hypertensive renal disease   . DM (diabetes mellitus), type 2 with renal complications ([place])   . Bipolar 1 disorder ([place])   . Asthma   . Generalized abdominal pain 05/07/2016    Past  Surgical History:  Procedure Laterality Date  . ABDOMINAL HYSTERECTOMY    . debribalator  2016  . INSERT / REPLACE / REMOVE PACEMAKER    . INSERTION OF ICD    . TONSILECTOMY, ADENOIDECTOMY, BILATERAL MYRINGOTOMY AND TUBES    . TONSILLECTOMY    . TONSILLECTOMY    . TUBAL LIGATION  1980  . TUBAL LIGATION      Prior to Admission medications   Medication Sig Start Date End Date Taking? Authorizing Provider  atorvastatin (LIPITOR) 40 MG tablet TAKE 1 TABLET BY MOUTH EVERY DAY--NEEDS APPT BEFORE RUN OUT 04/25/18  Yes Johnson, Megan P, DO  carvedilol (COREG) 6.25 MG tablet Take 1 tablet (6.25 mg total) by mouth 2 (two) times daily with a meal. 02/03/18  Yes Gollan, Kathlene November, MD  famotidine (PEPCID) 40 MG tablet Take 1 tablet (40 mg total) by mouth every evening. 04/25/18 04/25/19 Yes Johnson, Megan P, DO  furosemide (LASIX) 20 MG tablet Take 1 tablet (20 mg total) by mouth daily. 12/23/17  Yes Gollan, Kathlene November, MD  Insulin Glargine (LANTUS SOLOSTAR) 100 UNIT/ML Solostar Pen Inject 40 Units into the skin daily at 10 pm. 02/19/18  Yes Johnson, Megan P, DO  lamoTRIgine (LAMICTAL) 25 MG tablet Take 1 tablet (25 mg total) by mouth 2 (two) times daily. 03/25/18  Yes Eappen, Ria Clock, MD  pantoprazole (PROTONIX) 40 MG tablet Take 1 tablet (40 mg total) by mouth  daily. 08/28/17 08/28/18 Yes [name], MD  PARoxetine (PAXIL) 30 MG tablet TAKE 1 TABLET BY MOUTH EVERY DAY 03/14/18  Yes Volney American, PA-C  potassium chloride SA (K-DUR,KLOR-CON) 20 MEQ tablet Take 1 tablet (20 mEq total) by mouth as directed. Take with fluid pill and as directed 12/09/17  Yes Gollan, Kathlene November, MD  QUEtiapine (SEROQUEL) 300 MG tablet TAKE 1 TABLET BY MOUTH EVERYDAY AT BEDTIME 03/14/18  Yes Volney American, PA-C  sacubitril-valsartan (ENTRESTO) 49-51 MG Take 1 tablet by mouth 2 (two) times daily. 11/22/17  Yes Hackney, Otila Kluver A, FNP  spironolactone (ALDACTONE) 25 MG tablet TAKE 1 TABLET (25 MG TOTAL) BY MOUTH DAILY. 07/13/17   Yes [provider]  sucralfate (CARAFATE) 1 g tablet TAKE 1 TABLET BY MOUTH 4 TIMES A DAY 03/27/18  Yes Johnson, Megan P, DO  blood glucose meter kit and supplies KIT Dispense based on patient and insurance preference. Use up to four times daily as directed. (FOR ICD-9 250.00, 250.01). 07/24/17   Wynetta Emery, Megan P, DO  dicyclomine (BENTYL) 20 MG tablet TAKE 1 TABLET (20 MG TOTAL) BY MOUTH 3 (THREE) TIMES DAILY AS NEEDED FOR SPASMS. Patient not taking: Reported on 04/25/2018 01/02/18   Park Liter P, DO  Insulin Pen Needle 32G X 6 MM MISC 1 each by Does not apply route daily. 04/07/18   Johnson, Megan P, DO  metoCLOPramide (REGLAN) 10 MG tablet Take 1 tablet (10 mg total) by mouth 3 (three) times daily with meals. 02/09/18 02/09/19  Gregor Hams, MD  neomycin-polymyxin-hydrocortisone (CORTISPORIN) OTIC solution Place 3 drops into the right ear 4 (four) times daily. Patient not taking: Reported on 04/25/2018 09/12/17   Park Liter P, DO  nitroGLYCERIN (NITROSTAT) 0.4 MG SL tablet Place 1 tablet (0.4 mg total) under the tongue every 5 (five) minutes as needed for chest pain. 06/04/16   Gladstone Lighter, MD  ondansetron (ZOFRAN ODT) 4 MG disintegrating tablet Take 1 tablet (4 mg total) by mouth every 8 (eight) hours as needed for nausea or vomiting. 02/14/18   Nance Pear, MD    Allergies Levothyroxine  Family History  Problem Relation Age of Onset  . Hypertension Mother   . Hyperlipidemia Mother   . Heart disease Mother   . Hypertension Sister   . Asthma Sister   . Heart disease Sister   . Diabetes Sister   . Cancer Sister   . Alzheimer's disease Maternal Grandfather   . Hyperlipidemia Brother   . Asthma Sister   . Hypertension Sister   . Diabetes Sister     Social History Social History   Tobacco Use  . Smoking status: Former Smoker    Types: Cigarettes  . Smokeless tobacco: Never Used  . Tobacco comment: quit about 20 years ago   Substance Use Topics  .  Alcohol use: Not Currently  . Drug use: Yes    Frequency: 1.0 times per week    Types: Marijuana    Review of Systems  Constitutional: No fever.  Positive for generalized weakness. Eyes: No visual changes. ENT: No sore throat. Cardiovascular: Denies chest pain. Respiratory: Denies shortness of breath. Gastrointestinal: No vomiting.  Genitourinary: Negative for dysuria or hematuria.  Musculoskeletal: Negative for back pain. Skin: Negative for rash. Neurological: Negative for headache.   ____________________________________________   PHYSICAL EXAM:  VITAL SIGNS: ED Triage Vitals  Enc Vitals Group     BP 04/25/18 1833 107/70     Pulse Rate 04/25/18 1550 97  Resp 04/25/18 1833 10     Temp 04/25/18 1550 97.9 F (36.6 C)     Temp Source 04/25/18 1550 Oral     SpO2 04/25/18 1833 97 %     Weight --      Height --      Head Circumference --      Peak Flow --      Pain Score 04/25/18 1556 8     Pain Loc --      Pain Edu? --      Excl. in [place]? --     Constitutional: Alert and oriented. Well appearing and in no acute distress. Eyes: Conjunctivae are normal.  Head: Atraumatic. Nose: No congestion/rhinnorhea. Mouth/Throat: Mucous membranes are moist.   Neck: Normal range of motion.  Cardiovascular: Good peripheral circulation. Respiratory: Normal respiratory effort.  No retractions. Gastrointestinal: No distention.  Musculoskeletal:  Extremities warm and well perfused.  Neurologic:  Normal speech and language. No gross focal neurologic deficits are appreciated.  Skin:  Skin is warm and dry. No rash noted. Psychiatric: Mood and affect are normal. Speech and behavior are normal.  ____________________________________________   LABS (all labs ordered are listed, but only abnormal results are displayed)  Labs Reviewed  LIPASE, BLOOD - Abnormal; Notable for the following components:      Result Value   Lipase 269 (*)    All other components within normal limits    COMPREHENSIVE METABOLIC PANEL - Abnormal; Notable for the following components:   Sodium 132 (*)    Potassium 2.4 (*)    Chloride 90 (*)    Glucose, Bld 248 (*)    BUN 88 (*)    Creatinine, Ser 3.70 (*)    Total Protein 9.5 (*)    GFR calc non Af Amer 13 (*)    GFR calc Af Amer 15 (*)    All other components within normal limits  CBC  URINALYSIS, COMPLETE (UACMP) WITH MICROSCOPIC  POC URINE PREG, ED   ____________________________________________  EKG  ED ECG REPORT I, Arta Silence, the attending physician, personally viewed and interpreted this ECG.  Date: 04/25/2018 EKG Time: 1834 Rate: 61 Rhythm: normal sinus rhythm QRS Axis: normal Intervals: Normal ST/T Wave abnormalities: Nonspecific lateral T wave abnormalities Narrative Interpretation: no evidence of acute ischemia; no significant change when compared to EKG of 02/14/2018  ____________________________________________  RADIOLOGY    ____________________________________________   PROCEDURES  Procedure(s) performed: No  Procedures  Critical Care performed: No ____________________________________________   INITIAL IMPRESSION / ASSESSMENT AND PLAN / ED COURSE  Pertinent labs & imaging results that were available during my care of the patient were reviewed by me and considered in my medical decision making (see chart for details).  52 year old female with PMH as noted above presents for evaluation of acute renal insufficiency.  Patient reports generalized weakness and lightheadedness over the last several days to 1 week.  She was at a routine PMD visit yesterday.  She was called today instructed to come to the hospital.  I reviewed the past medical records in epic; I confirm that the patient had elevated creatinine of 3 yesterday, increased from baseline around 1.5.  She is also hypokalemic.  EKG shows no acute changes.  Labs here confirm the findings obtained yesterday.  I ordered fluids and potassium  repletion.  I suspect the most likely cause is from the Lasix, versus dehydration/prerenal.  Patient will require admission.  I signed her out to the hospitalist.   ____________________________________________   FINAL  CLINICAL IMPRESSION(S) / ED DIAGNOSES  Final diagnoses:  Acute renal insufficiency      NEW MEDICATIONS STARTED DURING THIS VISIT:  New Prescriptions   No medications on file     Note:  This document was prepared using Dragon voice recognition software and may include unintentional dictation errors.    Arta Silence, MD 04/25/18 1921

## 2018-04-26 LAB — CBC
HCT: 32.2 % — ABNORMAL LOW (ref 35.0–47.0)
HEMOGLOBIN: 11.6 g/dL — AB (ref 12.0–16.0)
MCH: 32.2 pg (ref 26.0–34.0)
MCHC: 36 g/dL (ref 32.0–36.0)
MCV: 89.4 fL (ref 80.0–100.0)
PLATELETS: 204 10*3/uL (ref 150–440)
RBC: 3.6 MIL/uL — ABNORMAL LOW (ref 3.80–5.20)
RDW: 12.9 % (ref 11.5–14.5)
WBC: 6.7 10*3/uL (ref 3.6–11.0)

## 2018-04-26 LAB — GLUCOSE, CAPILLARY
GLUCOSE-CAPILLARY: 176 mg/dL — AB (ref 70–99)
GLUCOSE-CAPILLARY: 166 mg/dL — AB (ref 70–99)
Glucose-Capillary: 208 mg/dL — ABNORMAL HIGH (ref 70–99)
Glucose-Capillary: 218 mg/dL — ABNORMAL HIGH (ref 70–99)

## 2018-04-26 LAB — BASIC METABOLIC PANEL
Anion gap: 12 (ref 5–15)
BUN: 80 mg/dL — ABNORMAL HIGH (ref 6–20)
CHLORIDE: 96 mmol/L — AB (ref 98–111)
CO2: 29 mmol/L (ref 22–32)
Calcium: 9.1 mg/dL (ref 8.9–10.3)
Creatinine, Ser: 2.76 mg/dL — ABNORMAL HIGH (ref 0.44–1.00)
GFR calc Af Amer: 21 mL/min — ABNORMAL LOW (ref >60)
GFR, EST NON AFRICAN AMERICAN: 18 mL/min — AB (ref >60)
Glucose, Bld: 218 mg/dL — ABNORMAL HIGH (ref 70–99)
POTASSIUM: 2.3 mmol/L — AB (ref 3.5–5.1)
SODIUM: 137 mmol/L (ref 135–145)

## 2018-04-26 LAB — POTASSIUM: POTASSIUM: 2.7 mmol/L — AB (ref 3.5–5.1)

## 2018-04-26 LAB — MAGNESIUM: MAGNESIUM: 2.4 mg/dL (ref 1.7–2.4)

## 2018-04-26 MED ORDER — POTASSIUM CHLORIDE CRYS ER 20 MEQ PO TBCR: 40.0000 meq | EXTENDED_RELEASE_TABLET | Freq: Two times a day (BID) | ORAL | Status: DC

## 2018-04-26 MED ORDER — POTASSIUM CHLORIDE 10 MEQ/100ML IV SOLN
10.0000 meq | INTRAVENOUS | Status: AC
  Administered 2018-04-26 (×4): 10 meq via INTRAVENOUS
  Filled 2018-04-26 (×4): qty 100

## 2018-04-26 MED ORDER — FAMOTIDINE 20 MG PO TABS: 40.0000 mg | ORAL_TABLET | ORAL | Status: DC

## 2018-04-26 MED ORDER — POTASSIUM CHLORIDE 10 MEQ/100ML IV SOLN
10.0000 meq | INTRAVENOUS | Status: AC
  Administered 2018-04-26 (×3): 10 meq via INTRAVENOUS
  Filled 2018-04-26 (×3): qty 100

## 2018-04-26 MED ORDER — POTASSIUM CHLORIDE CRYS ER 20 MEQ PO TBCR
40.0000 meq | EXTENDED_RELEASE_TABLET | Freq: Two times a day (BID) | ORAL | Status: AC
  Administered 2018-04-26 – 2018-04-27 (×2): 40 meq via ORAL
  Filled 2018-04-26 (×2): qty 2

## 2018-04-26 NOTE — Progress Notes (Addendum)
Bonnie Ochoa is a 52 y.o. female patient.  1. Acute renal insufficiency   2. ARF (acute renal failure) (HCC)     Past Medical History:  Diagnosis Date  . ADHD   . Arthritis   . Asthma   . Bipolar 1 disorder (H[facility])   . CHF (congestive heart failure) (H[facility])   . Cirrhosis of liver (H[facility])   . Coronary artery disease   . Depression   . Diabetes mellitus without complication (H[facility])   . Diverticulitis   . Hypertension   . IBS (irritable bowel syndrome)   . Insomnia   . Migraines   . PTSD (post-traumatic stress disorder)   . Restless leg syndrome   . Sleep apnea   . Vertigo     Current Facility-Administered Medications  Medication Dose Route Frequency Provider Last Rate Last Dose  . 0.9 %  sodium chloride infusion   Intravenous Continuous Adrian Saran, MD 75 mL/hr at 04/26/18 0500    . acetaminophen (TYLENOL) tablet 650 mg  650 mg Oral Q6H PRN Auburn Bilberry, MD       Or  . acetaminophen (TYLENOL) suppository 650 mg  650 mg Rectal Q6H PRN Auburn Bilberry, MD      . atorvastatin (LIPITOR) tablet 40 mg  40 mg Oral q1800 Auburn Bilberry, MD   40 mg at 04/25/18 2243  . famotidine (PEPCID) tablet 40 mg  40 mg Oral QPM Auburn Bilberry, MD   40 mg at 04/25/18 2243  . heparin injection 5,000 Units  5,000 Units Subcutaneous Q8H Auburn Bilberry, MD   5,000 Units at 04/25/18 2244  . insulin aspart (novoLOG) injection 0-9 Units  0-9 Units Subcutaneous TID WC Auburn Bilberry, MD      . insulin glargine (LANTUS) injection 20 Units  20 Units Subcutaneous Daily Auburn Bilberry, MD   20 Units at 04/25/18 2244  . lamoTRIgine (LAMICTAL) tablet 25 mg  25 mg Oral BID Auburn Bilberry, MD   25 mg at 04/25/18 2244  . metoCLOPramide (REGLAN) tablet 10 mg  10 mg Oral TID WC Auburn Bilberry, MD      . ondansetron (ZOFRAN) tablet 4 mg  4 mg Oral Q6H PRN Auburn Bilberry, MD       Or  . ondansetron (ZOFRAN) injection 4 mg  4 mg Intravenous Q6H PRN Auburn Bilberry, MD      . pantoprazole (PROTONIX) EC tablet 40 mg   40 mg Oral Daily Auburn Bilberry, MD      . PARoxetine (PAXIL) tablet 30 mg  30 mg Oral Daily Auburn Bilberry, MD      . potassium chloride 10 mEq in 100 mL IVPB  10 mEq Intravenous Q1 Hr x 4 Demetrius Charity, RPH      . potassium chloride SA (K-DUR,KLOR-CON) CR tablet 40 mEq  40 mEq Oral BID Demetrius Charity, [facility]      . QUEtiapine (SEROQUEL) tablet 300 mg  300 mg Oral Daily Auburn Bilberry, MD   300 mg at 04/25/18 2243  . sucralfate (CARAFATE) tablet 1 g  1 g Oral QID Auburn Bilberry, MD   1 g at 04/25/18 2244   Allergies  Allergen Reactions  . Levothyroxine Rash   Active Problems:   Acute renal failure (ARF) (HCC)  Blood pressure (!) 93/50, pulse 78, temperature 98.2 F (36.8 C), temperature source Oral, resp. rate 20, height 5\' 5"  (1.651 m), weight 194 lb 0.1 oz (88 kg), SpO2 98 %.  Pharmacy consulted to assist in electrolyte management of this  patient. K= 2.3.   Will give KCl 10 mEq IV x 4 doses. Will also check Magnesium. Will then recheck K @1300 . Patient also has orders for KCl 40 mEq PO BID.  ADD: Recheck K resulted @ 2.7 and Magnesium is within normal limits.  Will give an additional 4 doses of IV KCl runs. Patient also has oral KCl ordered @ 40 mEq PO BID starting @ 2100. Will recheck electrolytes with am labs.  Safiyya Stokes D 04/26/2018

## 2018-04-26 NOTE — Progress Notes (Signed)
Renal adjustment  Will renally adjust Famotidine from 40 mg PO daily to 48 hours.

## 2018-04-26 NOTE — Progress Notes (Signed)
[facility] - [place] [facility]   PATIENT NAME: Bonnie Ochoa    MR#:  [id number]  DATE OF BIRTH:  Nov 13, 1965  SUBJECTIVE:   Doing well this am  REVIEW OF SYSTEMS:    Review of Systems  Constitutional: Negative for fever, chills weight loss HENT: Negative for ear pain, nosebleeds, congestion, facial swelling, rhinorrhea, neck pain, neck stiffness and ear discharge.   Respiratory: Negative for cough, shortness of breath, wheezing  Cardiovascular: Negative for chest pain, palpitations and leg swelling.  Gastrointestinal: Negative for heartburn, abdominal pain, vomiting, diarrhea or consitpation Genitourinary: Negative for dysuria, urgency, frequency, hematuria Musculoskeletal: Negative for back pain or joint pain Neurological: Negative for dizziness, seizures, syncope, focal weakness,  numbness and headaches.  Hematological: Does not bruise/bleed easily.  Psychiatric/Behavioral: Negative for hallucinations, confusion, dysphoric mood    Tolerating Diet: yes      DRUG ALLERGIES:   Allergies  Allergen Reactions  . Levothyroxine Rash    VITALS:  Blood pressure (!) 93/50, pulse 78, temperature 98.2 F (36.8 C), temperature source Oral, resp. rate 20, height 5\' 5"  (1.651 m), weight 88 kg, SpO2 98 %.  PHYSICAL EXAMINATION:  Constitutional: Appears well-developed and well-nourished. No distress. HENT: Normocephalic. Marland Kitchen Oropharynx is clear and moist.  Eyes: Conjunctivae and EOM are normal. PERRLA, no scleral icterus.  Neck: Normal ROM. Neck supple. No JVD. No tracheal deviation. CVS: RRR, S1/S2 +, no murmurs, no gallops, no carotid bruit.  Pulmonary: Effort and breath sounds normal, no stridor, rhonchi, wheezes, rales.  Abdominal: Soft. BS +,  no distension, tenderness, rebound or guarding.  Musculoskeletal: Normal range of motion. No edema and no tenderness.  Neuro: Alert. CN 2-12 grossly intact. No focal deficits. Skin: Skin is warm and dry. No rash  noted. Psychiatric: Normal mood and affect.      LABORATORY PANEL:   CBC Recent Labs  Lab 04/26/18 0545  WBC 6.7  HGB 11.6*  HCT 32.2*  PLT 204   ------------------------------------------------------------------------------------------------------------------  Chemistries  Recent Labs  Lab 04/25/18 1603 04/26/18 0545  NA 132* 137  K 2.4* 2.3*  CL 90* 96*  CO2 28 29  GLUCOSE 248* 218*  BUN 88* 80*  CREATININE 3.70* 2.76*  CALCIUM 9.9 9.1  MG  --  2.4  AST 20  --   ALT 18  --   ALKPHOS 123  --   BILITOT 0.9  --    ------------------------------------------------------------------------------------------------------------------  Cardiac Enzymes No results for input(s): TROPONINI in the last 168 hours. ------------------------------------------------------------------------------------------------------------------  RADIOLOGY:  Dg Chest 2 View  Result Date: 04/25/2018 CLINICAL DATA:  Renal failure EXAM: CHEST - 2 VIEW COMPARISON:  02/14/2018 FINDINGS: Left subclavian AICD is stable. Normal heart size. Clear lungs. No pneumothorax. IMPRESSION: No active cardiopulmonary disease. Electronically Signed   By: Jolaine Click M.D.   On: 04/25/2018 20:19   US Renal  Result Date: 04/25/2018 CLINICAL DATA:  Initial evaluation for acute renal failure. EXAM: RENAL / URINARY TRACT ULTRASOUND COMPLETE COMPARISON:  Prior CT from 02/09/2018. FINDINGS: Right Kidney: Length: 10.1 cm. Echogenicity within normal limits. No mass or hydronephrosis visualized. Left Kidney: Length: 11.4 cm. Echogenicity within normal limits. No mass or hydronephrosis visualized. Bladder: Appears normal for degree of bladder distention. IMPRESSION: Negative renal ultrasound.  No hydronephrosis. Electronically Signed   By: Rise Mu M.D.   On: 04/25/2018 20:44     ASSESSMENT AND PLAN:   52 year old female with a history of liver cirrhosis and chronic systolic heart failure who presents to the  emergency room due to abnormal labs.  1.  Acute kidney injury due to hypotension and overdiuresis from ATN Creatinine is slowly improving with IV fluids Nephrology consultation pending  2.  Hypokalemia: Pharmacy consultation for repletion  3.  Diabetes: Continue current management with ADA diet  4.  Chronic systolic heart failure ejection fraction 20 to 25%: Due to low blood pressure medications have been discontinued for now  5.  Hyperlipidemia: Continue statin    Management plans discussed with the patient and she is in agreement.  CODE STATUS: Full  TOTAL TIME TAKING CARE OF THIS PATIENT: 28 minutes.     POSSIBLE D/C tomorrow, DEPENDING ON CLINICAL CONDITION.   Nakhi Choi M.D on 04/26/2018 at 11:29 AM  Between 7am to 6pm - Pager - [phone] After 6pm go to [email or web address] - password [email or web address]  [facility]  Office  [phone]  CC: Primary care physician; Dorcas Carrow, DO  Note: This dictation was prepared with Dragon dictation along with smaller phrase technology. Any transcriptional errors that result from this process are unintentional.

## 2018-04-26 NOTE — Progress Notes (Signed)
[facility]  ROUNDING NOTE   Subjective:  Patient well-known to [place]. Presents with nausea and vomiting over the past several days. Now has developed acute renal failure. Patient was last seen in our office in December 2018. Pt has hx of underlying CKD stage III.  Currently on IV fluid hydration with 0.9 normal saline at 75 cc/h. Blood pressure a bit low at 93/54. Serum potassium quite low at 2.7.  Objective:  Vital signs in last 24 hours:  Temp:  [97.7 F (36.5 C)-98.2 F (36.8 C)] 98.1 F (36.7 C) (09/14 1200) Pulse Rate:  [57-83] 72 (09/14 1200) Resp:  [10-20] 17 (09/14 1200) BP: (88-129)/(50-82) 93/54 (09/14 1200) SpO2:  [96 %-100 %] 96 % (09/14 1200) Weight:  [88 kg] 88 kg (09/13 2111)  Weight change:  Filed Weights   04/25/18 2111  Weight: 88 kg    Intake/Output: I/O last 3 completed shifts: In: 289.2 [I.V.:289.2] Out: -    Intake/Output this shift:  Total I/O In: 1034.8 [I.V.:738.1; IV Piggyback:296.8] Out: -   Physical Exam: General: No acute distress  Head: Normocephalic, atraumatic. Moist oral mucosal membranes  Eyes: Anicteric  Neck: Supple, trachea midline  Lungs:  Clear to auscultation, normal effort  Heart: S1S2 no rubs  Abdomen:  Soft, nontender, bowel sounds present  Extremities: No peripheral edema.  Neurologic: Awake, alert, following commands  Skin: No lesions       Basic Metabolic Panel: Recent Labs  Lab 04/24/18 1508 04/25/18 1603 04/26/18 0545 04/26/18 1331  NA 138 132* 137  --   K 3.0* 2.4* 2.3* 2.7*  CL 90* 90* 96*  --   CO2 27 28 29   --   GLUCOSE 207* 248* 218*  --   BUN 86* 88* 80*  --   CREATININE 3.23* 3.70* 2.76*  --   CALCIUM 10.0 9.9 9.1  --   MG  --   --  2.4  --     Liver Function Tests: Recent Labs  Lab 04/25/18 1603  AST 20  ALT 18  ALKPHOS 123  BILITOT 0.9  PROT 9.5*  ALBUMIN 4.8   Recent Labs  Lab 04/25/18 1603  LIPASE 269*   No results for input(s): AMMONIA in the last 168  hours.  CBC: Recent Labs  Lab 04/25/18 1603 04/26/18 0545  WBC 7.7 6.7  HGB 12.6 11.6*  HCT 36.1 32.2*  MCV 90.0 89.4  PLT 258 204    Cardiac Enzymes: No results for input(s): CKTOTAL, CKMB, CKMBINDEX, TROPONINI in the last 168 hours.  BNP: Invalid input(s): POCBNP  CBG: Recent Labs  Lab 04/23/18 1409 04/25/18 2152 04/26/18 0743 04/26/18 1200  GLUCAP 196* 202* 176* 166*    Microbiology: Results for orders placed or performed in visit on 02/19/18  Microscopic Examination     Status: None   Collection Time: 02/19/18 11:27 AM  Result Value Ref Range Status   WBC, UA 0-5 0 - 5 /hpf Final   RBC, UA 0-2 0 - 2 /hpf Final   Epithelial Cells (non renal) 0-10 0 - 10 /hpf Final   Casts Present None seen /lpf Final   Cast Type Hyaline casts N/A Final   Bacteria, UA Few None seen/Few Final    Coagulation Studies: No results for input(s): LABPROT, INR in the last 72 hours.  Urinalysis: No results for input(s): COLORURINE, LABSPEC, PHURINE, GLUCOSEU, HGBUR, BILIRUBINUR, KETONESUR, PROTEINUR, UROBILINOGEN, NITRITE, LEUKOCYTESUR in the last 72 hours.  Invalid input(s): APPERANCEUR    Imaging: Dg Chest  2 View  Result Date: 04/25/2018 CLINICAL DATA:  Renal failure EXAM: CHEST - 2 VIEW COMPARISON:  02/14/2018 FINDINGS: Left subclavian AICD is stable. Normal heart size. Clear lungs. No pneumothorax. IMPRESSION: No active cardiopulmonary disease. Electronically Signed   By: Marybelle Killings M.D.   On: 04/25/2018 20:19   US Renal  Result Date: 04/25/2018 CLINICAL DATA:  Initial evaluation for acute renal failure. EXAM: RENAL / URINARY TRACT ULTRASOUND COMPLETE COMPARISON:  Prior CT from 02/09/2018. FINDINGS: Right Kidney: Length: 10.1 cm. Echogenicity within normal limits. No mass or hydronephrosis visualized. Left Kidney: Length: 11.4 cm. Echogenicity within normal limits. No mass or hydronephrosis visualized. Bladder: Appears normal for degree of bladder distention. IMPRESSION:  Negative renal ultrasound.  No hydronephrosis. Electronically Signed   By: Jeannine Boga M.D.   On: 04/25/2018 20:44     Medications:   . sodium chloride 75 mL/hr at 04/26/18 1501  . potassium chloride 10 mEq (04/26/18 1501)   . atorvastatin  40 mg Oral q1800  . [START ON 04/27/2018] famotidine  40 mg Oral Q48H  . heparin  5,000 Units Subcutaneous Q8H  . insulin aspart  0-9 Units Subcutaneous TID WC  . insulin glargine  20 Units Subcutaneous Daily  . lamoTRIgine  25 mg Oral BID  . metoCLOPramide  10 mg Oral TID WC  . pantoprazole  40 mg Oral Daily  . PARoxetine  30 mg Oral Daily  . potassium chloride  40 mEq Oral BID  . QUEtiapine  300 mg Oral Daily  . sucralfate  1 g Oral QID   acetaminophen **OR** acetaminophen, ondansetron **OR** ondansetron (ZOFRAN) IV  Assessment/ Plan:  52 y.o. female with a PMHx of osteoarthritis, bipolar disorder, congestive heart failure, cirrhosis of the liver, depression, diabetes mellitus type 2, hypertension, irritable bowel syndrome, PTSD, restless leg syndrome, obstructive sleep apnea  1.  Acute renal failure.  2.  CKD stage III baseline EGFR 44.  3.  Diabetes mellitus type 2 with CKD.  4.  Hypokalemia.  5.  Anemia of CKD.   Plan:   Patient well-known to [place] from the office.  Her baseline EGFR is 44.  She was experiencing nausea and vomiting at home and was still taking Lasix.  Therefore suspect acute renal failure secondary to prolonged dehydration.  No urgent indication for dialysis at the moment.  Continue IV fluid hydration with 0.9 normal saline at 75 cc/h thing in mind that patient does have underlying congestive heart failure.  Agree with potassium repletion.  Continue to monitor serum potassium closely.  Renal ultrasound was negative for hydronephrosis.  No indication for Procrit at the moment.  Thanks for consultation.  Further plan as patient progresses.   LOS: 1 [place] 9/14/20193:51 PM

## 2018-04-27 LAB — GLUCOSE, CAPILLARY: Glucose-Capillary: 167 mg/dL — ABNORMAL HIGH (ref 70–99)

## 2018-04-27 LAB — BASIC METABOLIC PANEL
ANION GAP: 9 (ref 5–15)
BUN: 63 mg/dL — AB (ref 6–20)
CALCIUM: 9.4 mg/dL (ref 8.9–10.3)
CO2: 27 mmol/L (ref 22–32)
Chloride: 103 mmol/L (ref 98–111)
Creatinine, Ser: 2.26 mg/dL — ABNORMAL HIGH (ref 0.44–1.00)
GFR calc Af Amer: 27 mL/min — ABNORMAL LOW (ref >60)
GFR calc non Af Amer: 23 mL/min — ABNORMAL LOW (ref >60)
GLUCOSE: 172 mg/dL — AB (ref 70–99)
Potassium: 3.1 mmol/L — ABNORMAL LOW (ref 3.5–5.1)
Sodium: 139 mmol/L (ref 135–145)

## 2018-04-27 MED ORDER — INSULIN GLARGINE 100 UNIT/ML SOLOSTAR PEN: 20.0000 [IU] | PEN_INJECTOR | Freq: Every day | SUBCUTANEOUS | 12 refills | Status: DC

## 2018-04-27 MED ORDER — POTASSIUM CHLORIDE CRYS ER 20 MEQ PO TBCR: 40.0000 meq | EXTENDED_RELEASE_TABLET | Freq: Once | ORAL | Status: DC

## 2018-04-27 NOTE — Discharge Summary (Signed)
[facility] [place] NAME: Bonnie Ochoa    MR#:  [id number]  DATE OF BIRTH:  [birth year]  DATE OF ADMISSION:  04/25/2018 ADMITTING PHYSICIAN: Dustin Flock, MD  DATE OF DISCHARGE: 04/27/2018  PRIMARY CARE PHYSICIAN: Valerie Roys, DO    ADMISSION DIAGNOSIS:  ARF (acute renal failure) ([place]) [N17.9] Acute renal insufficiency [N28.9]  DISCHARGE DIAGNOSIS:  Active Problems:   Acute renal failure (ARF) ([place])   SECONDARY DIAGNOSIS:   Past Medical History:  Diagnosis Date  . ADHD   . Arthritis   . Asthma   . Bipolar 1 disorder ([place])   . CHF (congestive heart failure) ([place])   . Cirrhosis of liver ([place])   . Coronary artery disease   . Depression   . Diabetes mellitus without complication ([place])   . Diverticulitis   . Hypertension   . IBS (irritable bowel syndrome)   . Insomnia   . Migraines   . PTSD (post-traumatic stress disorder)   . Restless leg syndrome   . Sleep apnea   . Vertigo     HOSPITAL COURSE:  52 year old female with a history of liver cirrhosis and chronic systolic heart failure who presents to the emergency room due to abnormal labs.  1.  Acute kidney injury with history of chronic kidney disease stage III due to nausea, vomiting and diuretic use.  Does not appear to be ATN related This is prerenal azotemia which is slowly improving with IV fluids and holding nephrotoxic medications.  She will follow-up with nephrology in 2 to 3 days to have repeat BMP She will avoid nephrotoxic medications.  Renal ultrasound showed no evidence of hydronephrosis.   2.  Hypokalemia: This has been repleted  3.  Diabetes: Due to acute kidney injury Lantus dose has been decreased.  She will continue ADA diet  4.  Chronic systolic heart failure ejection fraction 20 to 25%: Due to low blood pressure medications have been discontinued for now She will need follow-up with cardiology within the next week.  5.  Hyperlipidemia:  Continue statin   DISCHARGE CONDITIONS AND DIET:   Stable for discharge on heart healthy diabetic diet  CONSULTS OBTAINED:  Treatment Team:  Anthonette Legato, MD  DRUG ALLERGIES:   Allergies  Allergen Reactions  . Levothyroxine Rash    DISCHARGE MEDICATIONS:   Allergies as of 04/27/2018      Reactions   Levothyroxine Rash      Medication List    STOP taking these medications   carvedilol 6.25 MG tablet Commonly known as:  COREG   furosemide 20 MG tablet Commonly known as:  LASIX   potassium chloride SA 20 MEQ tablet Commonly known as:  K-DUR,KLOR-CON   sacubitril-valsartan 49-51 MG Commonly known as:  ENTRESTO   spironolactone 25 MG tablet Commonly known as:  ALDACTONE     TAKE these medications   atorvastatin 40 MG tablet Commonly known as:  LIPITOR TAKE 1 TABLET BY MOUTH EVERY DAY--NEEDS APPT BEFORE RUN OUT   blood glucose meter kit and supplies Kit Dispense based on patient and insurance preference. Use up to four times daily as directed. (FOR ICD-9 250.00, 250.01).   dicyclomine 20 MG tablet Commonly known as:  BENTYL TAKE 1 TABLET (20 MG TOTAL) BY MOUTH 3 (THREE) TIMES DAILY AS NEEDED FOR SPASMS.   famotidine 40 MG tablet Commonly known as:  PEPCID Take 1 tablet (40 mg total) by mouth every evening.   Insulin Glargine 100 UNIT/ML Solostar  Pen Commonly known as:  LANTUS Inject 20 Units into the skin daily at 10 pm. What changed:  how much to take   Insulin Pen Needle 32G X 6 MM Misc 1 each by Does not apply route daily.   lamoTRIgine 25 MG tablet Commonly known as:  LAMICTAL Take 1 tablet (25 mg total) by mouth 2 (two) times daily.   metoCLOPramide 10 MG tablet Commonly known as:  REGLAN Take 1 tablet (10 mg total) by mouth 3 (three) times daily with meals.   neomycin-polymyxin-hydrocortisone OTIC solution Commonly known as:  CORTISPORIN Place 3 drops into the right ear 4 (four) times daily.   nitroGLYCERIN 0.4 MG SL tablet Commonly  known as:  NITROSTAT Place 1 tablet (0.4 mg total) under the tongue every 5 (five) minutes as needed for chest pain.   ondansetron 4 MG disintegrating tablet Commonly known as:  ZOFRAN-ODT Take 1 tablet (4 mg total) by mouth every 8 (eight) hours as needed for nausea or vomiting.   pantoprazole 40 MG tablet Commonly known as:  PROTONIX Take 1 tablet (40 mg total) by mouth daily.   PARoxetine 30 MG tablet Commonly known as:  PAXIL TAKE 1 TABLET BY MOUTH EVERY DAY   QUEtiapine 300 MG tablet Commonly known as:  SEROQUEL TAKE 1 TABLET BY MOUTH EVERYDAY AT BEDTIME   sucralfate 1 g tablet Commonly known as:  CARAFATE TAKE 1 TABLET BY MOUTH 4 TIMES A DAY         Today   CHIEF COMPLAINT:  Patient is anxious to go home.  No urinary complaints.  No chest pain or shortness of breath.   VITAL SIGNS:  Blood pressure 104/70, pulse 66, temperature 98.4 F (36.9 C), temperature source Oral, resp. rate 20, height 5' 5"  (1.651 m), weight 88 kg, SpO2 99 %.   REVIEW OF SYSTEMS:  Review of Systems  Constitutional: Negative.  Negative for chills, fever and malaise/fatigue.  HENT: Negative.  Negative for ear discharge, ear pain, hearing loss, nosebleeds and sore throat.   Eyes: Negative.  Negative for blurred vision and pain.  Respiratory: Negative.  Negative for cough, hemoptysis, shortness of breath and wheezing.   Cardiovascular: Negative.  Negative for chest pain, palpitations and leg swelling.  Gastrointestinal: Negative.  Negative for abdominal pain, blood in stool, diarrhea, nausea and vomiting.  Genitourinary: Negative.  Negative for dysuria.  Musculoskeletal: Negative.  Negative for back pain.  Skin: Negative.   Neurological: Negative for dizziness, tremors, speech change, focal weakness, seizures and headaches.  Endo/Heme/Allergies: Negative.  Does not bruise/bleed easily.  Psychiatric/Behavioral: Negative.  Negative for depression, hallucinations and suicidal ideas.      PHYSICAL EXAMINATION:  GENERAL:  52 y.o.-year-old patient lying in the bed with no acute distress.  NECK:  Supple, no jugular venous distention. No thyroid enlargement, no tenderness.  LUNGS: Normal breath sounds bilaterally, no wheezing, rales,rhonchi  No use of accessory muscles of respiration.  CARDIOVASCULAR: S1, S2 normal. No murmurs, rubs, or gallops.  ABDOMEN: Soft, non-tender, non-distended. Bowel sounds present. No organomegaly or mass.  EXTREMITIES: No pedal edema, cyanosis, or clubbing.  PSYCHIATRIC: The patient is alert and oriented x 3.  SKIN: No obvious rash, lesion, or ulcer.   DATA REVIEW:   CBC Recent Labs  Lab 04/26/18 0545  WBC 6.7  HGB 11.6*  HCT 32.2*  PLT 204    Chemistries  Recent Labs  Lab 04/25/18 1603 04/26/18 0545  04/27/18 0541  NA 132* 137  --  139  K  2.4* 2.3*   < > 3.1*  CL 90* 96*  --  103  CO2 28 29  --  27  GLUCOSE 248* 218*  --  172*  BUN 88* 80*  --  63*  CREATININE 3.70* 2.76*  --  2.26*  CALCIUM 9.9 9.1  --  9.4  MG  --  2.4  --   --   AST 20  --   --   --   ALT 18  --   --   --   ALKPHOS 123  --   --   --   BILITOT 0.9  --   --   --    < > = values in this interval not displayed.    Cardiac Enzymes No results for input(s): TROPONINI in the last 168 hours.  Microbiology Results  @[id number] @  RADIOLOGY:  Dg Chest 2 View  Result Date: 04/25/2018 CLINICAL DATA:  Renal failure EXAM: CHEST - 2 VIEW COMPARISON:  02/14/2018 FINDINGS: Left subclavian AICD is stable. Normal heart size. Clear lungs. No pneumothorax. IMPRESSION: No active cardiopulmonary disease. Electronically Signed   By: Marybelle Killings M.D.   On: 04/25/2018 20:19   US Renal  Result Date: 04/25/2018 CLINICAL DATA:  Initial evaluation for acute renal failure. EXAM: RENAL / URINARY TRACT ULTRASOUND COMPLETE COMPARISON:  Prior CT from 02/09/2018. FINDINGS: Right Kidney: Length: 10.1 cm. Echogenicity within normal limits. No mass or hydronephrosis visualized.  Left Kidney: Length: 11.4 cm. Echogenicity within normal limits. No mass or hydronephrosis visualized. Bladder: Appears normal for degree of bladder distention. IMPRESSION: Negative renal ultrasound.  No hydronephrosis. Electronically Signed   By: Jeannine Boga M.D.   On: 04/25/2018 20:44      Allergies as of 04/27/2018      Reactions   Levothyroxine Rash      Medication List    STOP taking these medications   carvedilol 6.25 MG tablet Commonly known as:  COREG   furosemide 20 MG tablet Commonly known as:  LASIX   potassium chloride SA 20 MEQ tablet Commonly known as:  K-DUR,KLOR-CON   sacubitril-valsartan 49-51 MG Commonly known as:  ENTRESTO   spironolactone 25 MG tablet Commonly known as:  ALDACTONE     TAKE these medications   atorvastatin 40 MG tablet Commonly known as:  LIPITOR TAKE 1 TABLET BY MOUTH EVERY DAY--NEEDS APPT BEFORE RUN OUT   blood glucose meter kit and supplies Kit Dispense based on patient and insurance preference. Use up to four times daily as directed. (FOR ICD-9 250.00, 250.01).   dicyclomine 20 MG tablet Commonly known as:  BENTYL TAKE 1 TABLET (20 MG TOTAL) BY MOUTH 3 (THREE) TIMES DAILY AS NEEDED FOR SPASMS.   famotidine 40 MG tablet Commonly known as:  PEPCID Take 1 tablet (40 mg total) by mouth every evening.   Insulin Glargine 100 UNIT/ML Solostar Pen Commonly known as:  LANTUS Inject 20 Units into the skin daily at 10 pm. What changed:  how much to take   Insulin Pen Needle 32G X 6 MM Misc 1 each by Does not apply route daily.   lamoTRIgine 25 MG tablet Commonly known as:  LAMICTAL Take 1 tablet (25 mg total) by mouth 2 (two) times daily.   metoCLOPramide 10 MG tablet Commonly known as:  REGLAN Take 1 tablet (10 mg total) by mouth 3 (three) times daily with meals.   neomycin-polymyxin-hydrocortisone OTIC solution Commonly known as:  CORTISPORIN Place 3 drops into the right ear 4 (four)  times daily.   nitroGLYCERIN  0.4 MG SL tablet Commonly known as:  NITROSTAT Place 1 tablet (0.4 mg total) under the tongue every 5 (five) minutes as needed for chest pain.   ondansetron 4 MG disintegrating tablet Commonly known as:  ZOFRAN-ODT Take 1 tablet (4 mg total) by mouth every 8 (eight) hours as needed for nausea or vomiting.   pantoprazole 40 MG tablet Commonly known as:  PROTONIX Take 1 tablet (40 mg total) by mouth daily.   PARoxetine 30 MG tablet Commonly known as:  PAXIL TAKE 1 TABLET BY MOUTH EVERY DAY   QUEtiapine 300 MG tablet Commonly known as:  SEROQUEL TAKE 1 TABLET BY MOUTH EVERYDAY AT BEDTIME   sucralfate 1 g tablet Commonly known as:  CARAFATE TAKE 1 TABLET BY MOUTH 4 TIMES A DAY         Management plans discussed with the patient and she is in agreement. Stable for discharge home  Patient should follow up with pcp  CODE STATUS:     Code Status Orders  (From admission, onward)         Start     Ordered   04/25/18 2136  Full code  Continuous     04/25/18 2135        Code Status History    Date Active Date Inactive Code Status Order ID Comments User Context   07/03/2017 0100 07/05/2017 1806 Full Code [id number]  Harrie Foreman, MD Inpatient   09/17/2016 1712 09/18/2016 2025 Full Code [id number]  Fritzi Mandes, MD Inpatient   09/10/2016 1105 09/11/2016 2045 Full Code [id number]  Dustin Flock, MD Inpatient   05/30/2016 1129 06/04/2016 2226 Full Code [id number]  Fritzi Mandes, MD Inpatient      TOTAL TIME TAKING CARE OF THIS PATIENT: 38 minutes.    Note: This dictation was prepared with Dragon dictation along with smaller phrase technology. Any transcriptional errors that result from this process are unintentional.  Maxwel Meadowcroft M.D on 04/27/2018 at 10:16 AM  Between 7am to 6pm - Pager - [phone] After 6pm go to [email or web address] - password [email or web address] [facility]  Office  [phone]  CC: Primary care physician; Valerie Roys, DO

## 2018-04-27 NOTE — Progress Notes (Signed)
Bonnie Ochoa is a 52 y.o. female patient.  1. Acute renal insufficiency   2. ARF (acute renal failure) ([facility])     Past Medical History:  Diagnosis Date  . ADHD   . Arthritis   . Asthma   . Bipolar 1 disorder ([facility])   . CHF (congestive heart failure) ([facility])   . Cirrhosis of liver ([facility])   . Coronary artery disease   . Depression   . Diabetes mellitus without complication ([facility])   . Diverticulitis   . Hypertension   . IBS (irritable bowel syndrome)   . Insomnia   . Migraines   . PTSD (post-traumatic stress disorder)   . Restless leg syndrome   . Sleep apnea   . Vertigo     Current Facility-Administered Medications  Medication Dose Route Frequency Provider Last Rate Last Dose  . 0.9 %  sodium chloride infusion   Intravenous Continuous Adrian Saran, MD 75 mL/hr at 04/27/18 0600    . acetaminophen (TYLENOL) tablet 650 mg  650 mg Oral Q6H PRN Auburn Bilberry, MD       Or  . acetaminophen (TYLENOL) suppository 650 mg  650 mg Rectal Q6H PRN Auburn Bilberry, MD      . atorvastatin (LIPITOR) tablet 40 mg  40 mg Oral q1800 Auburn Bilberry, MD   40 mg at 04/26/18 1843  . famotidine (PEPCID) tablet 40 mg  40 mg Oral Q48H Demetrius Charity, [facility]      . heparin injection 5,000 Units  5,000 Units Subcutaneous Q8H Auburn Bilberry, MD   5,000 Units at 04/27/18 [phone]  . insulin aspart (novoLOG) injection 0-9 Units  0-9 Units Subcutaneous TID WC Auburn Bilberry, MD   3 Units at 04/26/18 1748  . insulin glargine (LANTUS) injection 20 Units  20 Units Subcutaneous Daily Auburn Bilberry, MD   20 Units at 04/26/18 1129  . lamoTRIgine (LAMICTAL) tablet 25 mg  25 mg Oral BID Auburn Bilberry, MD   25 mg at 04/26/18 2054  . metoCLOPramide (REGLAN) tablet 10 mg  10 mg Oral TID WC Auburn Bilberry, MD   10 mg at 04/26/18 1843  . ondansetron (ZOFRAN) tablet 4 mg  4 mg Oral Q6H PRN Auburn Bilberry, MD       Or  . ondansetron (ZOFRAN) injection 4 mg  4 mg Intravenous Q6H PRN Auburn Bilberry, MD      . pantoprazole  (PROTONIX) EC tablet 40 mg  40 mg Oral Daily Auburn Bilberry, MD   40 mg at 04/26/18 1128  . PARoxetine (PAXIL) tablet 30 mg  30 mg Oral Daily Auburn Bilberry, MD   30 mg at 04/26/18 1128  . potassium chloride SA (K-DUR,KLOR-CON) CR tablet 40 mEq  40 mEq Oral BID Demetrius Charity, RPH   40 mEq at 04/26/18 2054  . QUEtiapine (SEROQUEL) tablet 300 mg  300 mg Oral Daily Auburn Bilberry, MD   300 mg at 04/26/18 1129  . sucralfate (CARAFATE) tablet 1 g  1 g Oral QID Auburn Bilberry, MD   1 g at 04/26/18 2054   Allergies  Allergen Reactions  . Levothyroxine Rash   Active Problems:   Acute renal failure (ARF) (H[facility])  Blood pressure 104/70, pulse 66, temperature 98.4 F (36.9 C), temperature source Oral, resp. rate 20, height 5\' 5"  (1.651 m), weight 194 lb 0.1 oz (88 kg), SpO2 99 %.  Pharmacy consulted to assist in electrolyte management of this patient. K= 3.1   Patient has order to receive 40 mEq of KCl (  oral) @ 10:00. Will give an additional 40 mEq PO @ 2200. Will recheck labs in am.    Queenie Aufiero D 04/27/2018

## 2018-04-28 ENCOUNTER — Telehealth: Payer: Self-pay

## 2018-04-28 NOTE — Telephone Encounter (Signed)
I have made the 1st attempt to contact the patient or family member in charge, in order to follow up from recently being discharged from the hospital. I left a message on voicemail but I will make another attempt at a different time.   Direct call back - [phone] 

## 2018-04-28 NOTE — Telephone Encounter (Signed)
Transition Care Management Follow-up Telephone Call  Date of discharge and from where: 04/27/2018 at [facility]  How have you been since you were released from the hospital? "feeling better"  Any questions or concerns? No   Items Reviewed:  Did the pt receive and understand the discharge instructions provided? Yes   Medications obtained and verified? Yes   Any new allergies since your discharge? No   Dietary orders reviewed? Yes  Do you have support at home? Yes   Functional Questionnaire: (I = Independent and D = Dependent) ADLs: I  Bathing/Dressing- I  Meal Prep- I  Eating- I  Maintaining continence- I  Transferring/Ambulation- I  Managing Meds- I  Follow up appointments reviewed:   PCP Hospital f/u appt confirmed? Yes  Scheduled to see Dr.Johnson on 05/01/2018 @ 11:15am.  Specialist Hospital f/u appt confirmed? Yes  Scheduled to see Dr.lateef on 05/02/2018 per pt,  Sees Dr.Berge in October.   Are transportation arrangements needed? No   If their condition worsens, is the pt aware to call PCP or go to the Emergency Dept.? Yes  Was the patient provided with contact information for the PCP's office or ED? Yes  Was to pt encouraged to call back with questions or concerns? Yes

## 2018-05-01 ENCOUNTER — Ambulatory Visit (INDEPENDENT_AMBULATORY_CARE_PROVIDER_SITE_OTHER): Payer: Medicare Other | Admitting: Family Medicine

## 2018-05-01 ENCOUNTER — Encounter: Payer: Self-pay | Admitting: Family Medicine

## 2018-05-01 VITALS — BP 110/72 | HR 99 | Temp 98.1°F | Wt 203.1 lb

## 2018-05-01 DIAGNOSIS — N183 Chronic kidney disease, stage 3 unspecified: Secondary | ICD-10-CM

## 2018-05-01 DIAGNOSIS — Z794 Long term (current) use of insulin: Secondary | ICD-10-CM

## 2018-05-01 DIAGNOSIS — E1122 Type 2 diabetes mellitus with diabetic chronic kidney disease: Secondary | ICD-10-CM

## 2018-05-01 DIAGNOSIS — N179 Acute kidney failure, unspecified: Secondary | ICD-10-CM

## 2018-05-01 DIAGNOSIS — I952 Hypotension due to drugs: Secondary | ICD-10-CM | POA: Diagnosis not present

## 2018-05-01 MED ORDER — SUCRALFATE 1 G PO TABS: 1.0000 g | ORAL_TABLET | Freq: Four times a day (QID) | ORAL | 3 refills | Status: DC

## 2018-05-01 MED ORDER — METOCLOPRAMIDE HCL 10 MG PO TABS: 10.0000 mg | ORAL_TABLET | Freq: Three times a day (TID) | ORAL | 1 refills | Status: DC

## 2018-05-01 NOTE — Progress Notes (Addendum)
BP 110/72 (BP Location: Left Arm, Patient Position: Sitting, Cuff Size: Normal)   Pulse 99   Temp 98.1 F (36.7 C)   Wt 203 lb 1 oz (92.1 kg)   SpO2 97%   BMI 33.79 kg/m    Subjective:    Patient ID: Bonnie Ochoa, female    DOB: August 29, 1965, 52 y.o.   MRN: [id number]  HPI: Bonnie Ochoa is a 52 y.o. female  Chief Complaint  Patient presents with  . Hospitalization Follow-up   Transition of Care Hospital Follow up.   Hospital/Facility: [facility] D/C Physician: Dr. Benjie Karvonen D/C Date: 04/27/18  Records Requested: 04/27/18 Records Received: 04/27/18 Records Reviewed: 05/01/18  Diagnoses on Discharge: Acute Renal Failure  Date of interactive Contact within 48 hours of discharge: 04/28/18 Contact was through: phone  Date of 7 day or 14 day face-to-face visit:  05/01/18  within 7 days  Outpatient Encounter Medications as of 05/01/2018  Medication Sig  . blood glucose meter kit and supplies KIT Dispense based on patient and insurance preference. Use up to four times daily as directed. (FOR ICD-9 250.00, 250.01).  . famotidine (PEPCID) 40 MG tablet Take 1 tablet (40 mg total) by mouth every evening.  . Insulin Glargine (LANTUS SOLOSTAR) 100 UNIT/ML Solostar Pen Inject 20 Units into the skin daily at 10 pm.  . Insulin Pen Needle 32G X 6 MM MISC 1 each by Does not apply route daily.  Marland Kitchen lamoTRIgine (LAMICTAL) 25 MG tablet Take 1 tablet (25 mg total) by mouth 2 (two) times daily.  . metoCLOPramide (REGLAN) 10 MG tablet Take 1 tablet (10 mg total) by mouth 3 (three) times daily with meals.  Marland Kitchen neomycin-polymyxin-hydrocortisone (CORTISPORIN) OTIC solution Place 3 drops into the right ear 4 (four) times daily. (Patient not taking: Reported on 05/08/2018)  . ondansetron (ZOFRAN ODT) 4 MG disintegrating tablet Take 1 tablet (4 mg total) by mouth every 8 (eight) hours as needed for nausea or vomiting. (Patient not taking: Reported on 05/08/2018)  . pantoprazole (PROTONIX) 40 MG tablet Take 1 tablet (40 mg  total) by mouth daily.  Marland Kitchen PARoxetine (PAXIL) 30 MG tablet TAKE 1 TABLET BY MOUTH EVERY DAY  . QUEtiapine (SEROQUEL) 300 MG tablet TAKE 1 TABLET BY MOUTH EVERYDAY AT BEDTIME  . sucralfate (CARAFATE) 1 g tablet Take 1 tablet (1 g total) by mouth 4 (four) times daily.  . [DISCONTINUED] atorvastatin (LIPITOR) 40 MG tablet TAKE 1 TABLET BY MOUTH EVERY DAY--NEEDS APPT BEFORE RUN OUT  . [DISCONTINUED] dicyclomine (BENTYL) 20 MG tablet TAKE 1 TABLET (20 MG TOTAL) BY MOUTH 3 (THREE) TIMES DAILY AS NEEDED FOR SPASMS.  . [DISCONTINUED] metoCLOPramide (REGLAN) 10 MG tablet Take 1 tablet (10 mg total) by mouth 3 (three) times daily with meals.  . [DISCONTINUED] nitroGLYCERIN (NITROSTAT) 0.4 MG SL tablet Place 1 tablet (0.4 mg total) under the tongue every 5 (five) minutes as needed for chest pain. (Patient not taking: Reported on 05/08/2018)  . [DISCONTINUED] sucralfate (CARAFATE) 1 g tablet TAKE 1 TABLET BY MOUTH 4 TIMES A DAY   No facility-administered encounter medications on file as of 05/01/2018.    Per hospitalist:  HOSPITAL COURSE:  "52 year old female with a history of liver cirrhosis and chronic systolic heart failure who presents to the emergency room due to abnormal labs.  1. Acute kidney injury with history of chronic kidney disease stage III due to nausea, vomiting and diuretic use.  Does not appear to be ATN related This is prerenal azotemia which is slowly improving  with IV fluids and holding nephrotoxic medications.  She will follow-up with nephrology in 2 to 3 days to have repeat BMP She will avoid nephrotoxic medications.  Renal ultrasound showed no evidence of hydronephrosis.   2. Hypokalemia: This has been repleted  3. Diabetes: Due to acute kidney injury Lantus dose has been decreased.  She will continue ADA diet  4. Chronic systolic heart failure ejection fraction 20 to 25%: Due to low blood pressure medications have been discontinued for now She will need follow-up with  cardiology within the next week.  5. Hyperlipidemia: Continue statin"   Diagnostic Tests Reviewed:   CHEST - 2 VIEW  COMPARISON:  02/14/2018  FINDINGS: Left subclavian AICD is stable. Normal heart size. Clear lungs. No pneumothorax.  IMPRESSION: No active cardiopulmonary disease.  RENAL / URINARY TRACT ULTRASOUND COMPLETE  COMPARISON:  Prior CT from 02/09/2018.  FINDINGS: Right Kidney:  Length: 10.1 cm. Echogenicity within normal limits. No mass or hydronephrosis visualized.  Left Kidney:  Length: 11.4 cm. Echogenicity within normal limits. No mass or hydronephrosis visualized.  Bladder:  Appears normal for degree of bladder distention.  IMPRESSION: Negative renal ultrasound.  No hydronephrosis.  Disposition: Home  Consults: Nephrology  Discharge Instructions:  Follow up here  Disease/illness Education: Given today  Home Health/Community Services Discussions/Referrals: N/A  Establishment or re-establishment of referral orders for community resources: N/A  Discussion with other health care providers: N/A  Assessment and Support of treatment regimen adherence: [place] with: Patient and wife  Education for self-management, independent living, and ADLs: Given today  Adan states that she is doing OK. Having some pain in her back. Otherwise feeling well with no other concerns or complaints at this time. Seeing the kidney doctor tomorrow.   Relevant past medical, surgical, family and social history reviewed and updated as indicated. Interim medical history since our last visit reviewed. Allergies and medications reviewed and updated.  Review of Systems  Constitutional: Negative.   Respiratory: Negative.   Cardiovascular: Negative.   Neurological: Negative.   Psychiatric/Behavioral: Negative.     Per HPI unless specifically indicated above     Objective:    BP 110/72 (BP Location: Left Arm, Patient Position:  Sitting, Cuff Size: Normal)   Pulse 99   Temp 98.1 F (36.7 C)   Wt 203 lb 1 oz (92.1 kg)   SpO2 97%   BMI 33.79 kg/m   Wt Readings from Last 3 Encounters:  06/01/18 195 lb 4.8 oz (88.6 kg)  05/19/18 200 lb 6.4 oz (90.9 kg)  05/09/18 200 lb 9.6 oz (91 kg)    Physical Exam  Constitutional: She is oriented to person, place, and time. She appears well-developed and well-nourished. No distress.  HENT:  Head: Normocephalic and atraumatic.  Right Ear: Hearing normal.  Left Ear: Hearing normal.  Nose: Nose normal.  Eyes: Conjunctivae and lids are normal. Right eye exhibits no discharge. Left eye exhibits no discharge. No scleral icterus.  Cardiovascular: Normal rate, regular rhythm, normal heart sounds and intact distal pulses. Exam reveals no gallop and no friction rub.  No murmur heard. Pulmonary/Chest: Effort normal and breath sounds normal. No stridor. No respiratory distress. She has no wheezes. She has no rales. She exhibits no tenderness.  Musculoskeletal: Normal range of motion.  Neurological: She is alert and oriented to person, place, and time.  Skin: Skin is warm, dry and intact. Capillary refill takes less than 2 seconds. No rash noted. She is not diaphoretic. No erythema. No pallor.  Psychiatric: She has a normal mood and affect. Her speech is normal and behavior is normal. Judgment and thought content normal. Cognition and memory are normal.  Nursing note and vitals reviewed.   Results for orders placed or performed in visit on 86/54/56  Basic metabolic panel  Result Value Ref Range   Glucose 198 (H) 65 - 99 mg/dL   BUN 39 (H) 6 - 24 mg/dL   Creatinine, Ser 1.88 (H) 0.57 - 1.00 mg/dL   GFR calc non Af Amer 30 (L) >59 mL/min/1.73   GFR calc Af Amer 34 (L) >59 mL/min/1.73   BUN/Creatinine Ratio 21 9 - 23   Sodium 142 134 - 144 mmol/L   Potassium 3.3 (L) 3.5 - 5.2 mmol/L   Chloride 99 96 - 106 mmol/L   CO2 23 20 - 29 mmol/L   Calcium 10.2 8.7 - 10.2 mg/dL        Assessment & Plan:   Problem List Items Addressed This Visit      Endocrine   DM (diabetes mellitus), type 2 with renal complications (HCC)    Lantus decreased from 40 units daily to 20 units daily. Continue here and recheck 2 weeks.         Genitourinary   Acute renal failure (ARF) (HCC) - Primary   Relevant Orders   Basic metabolic panel (Completed)    Other Visit Diagnoses    Hypotension due to drugs       Taken off all her BP meds due to hypotension. Was on carvedilol, lasix, entresto and spironalactone. BP good today. Remain off and recheck 2 weeks with cards.        Follow up plan: Return As scheduled.

## 2018-05-01 NOTE — Assessment & Plan Note (Signed)
Lantus decreased from 40 units daily to 20 units daily. Continue here and recheck 2 weeks.

## 2018-05-02 ENCOUNTER — Telehealth: Payer: Self-pay

## 2018-05-02 DIAGNOSIS — I5022 Chronic systolic (congestive) heart failure: Secondary | ICD-10-CM | POA: Diagnosis not present

## 2018-05-02 DIAGNOSIS — N179 Acute kidney failure, unspecified: Secondary | ICD-10-CM | POA: Diagnosis not present

## 2018-05-02 DIAGNOSIS — N183 Chronic kidney disease, stage 3 (moderate): Secondary | ICD-10-CM | POA: Diagnosis not present

## 2018-05-02 DIAGNOSIS — E1122 Type 2 diabetes mellitus with diabetic chronic kidney disease: Secondary | ICD-10-CM | POA: Diagnosis not present

## 2018-05-02 DIAGNOSIS — E876 Hypokalemia: Secondary | ICD-10-CM | POA: Diagnosis not present

## 2018-05-02 LAB — BASIC METABOLIC PANEL WITH GFR
BUN/Creatinine Ratio: 21 (ref 9–23)
BUN: 39 mg/dL — ABNORMAL HIGH (ref 6–24)
CO2: 23 mmol/L (ref 20–29)
Calcium: 10.2 mg/dL (ref 8.7–10.2)
Chloride: 99 mmol/L (ref 96–106)
Creatinine, Ser: 1.88 mg/dL — ABNORMAL HIGH (ref 0.57–1.00)
GFR calc Af Amer: 34 mL/min/1.73 — ABNORMAL LOW (ref >59)
GFR calc non Af Amer: 30 mL/min/1.73 — ABNORMAL LOW (ref >59)
Glucose: 198 mg/dL — ABNORMAL HIGH (ref 65–99)
Potassium: 3.3 mmol/L — ABNORMAL LOW (ref 3.5–5.2)
Sodium: 142 mmol/L (ref 134–144)

## 2018-05-02 NOTE — Telephone Encounter (Signed)
-----   Message from Pasty Spillers, MD sent at 04/21/2018  3:28 PM EDT ----- [name], please let patient know, her creatinine, which is her kidney function, was slightly elevated from before.  She should follow-up with Dr. Laural Benes to see if she needs a nephrologist referral.  Thank you She was also supposed to be scheduled for an EGD and colonoscopy, and I do not see these scheduled.  Thank you  MELD 12.

## 2018-05-02 NOTE — Telephone Encounter (Signed)
Returned patients call unable to lvm.  Voicemail did not pick up.  Thanks [employer]

## 2018-05-02 NOTE — Telephone Encounter (Signed)
LVM for pt to call office regarding lab results.  Thanks Ryson Bacha 

## 2018-05-07 ENCOUNTER — Telehealth: Payer: Self-pay | Admitting: Family Medicine

## 2018-05-07 NOTE — Telephone Encounter (Signed)
Copied from CRM [id number]. Topic: Inquiry >> May 02, 2018  3:09 PM Windy Kalata, NT wrote: Reason for CRM: [name] is calling from [employer] and is calling to confirm if the office received the request for chart notes in regards to patient needing a cpap supplies.   Cb# [phone] >> May 07, 2018 10:37 AM Gabriel Cirri wrote: Spoke with representative from [employer] to let them know we received the request for patients records supporting the need for cpap supplies.   They would like for this to be expedited if possible.    I have placed the request in Dr Henriette Combs box please advise. Thank you >> May 07, 2018 10:59 AM Windy Kalata, NT wrote: [name] is calling from [employer] in regards to a fax they sent over stating they needed office notes stating that the patient is using cpap and that they have been diagnosed with sleep apnea.   CB# [phone] Fax# [phone]

## 2018-05-08 ENCOUNTER — Emergency Department: Payer: Medicare Other

## 2018-05-08 ENCOUNTER — Encounter: Payer: Self-pay | Admitting: Emergency Medicine

## 2018-05-08 ENCOUNTER — Encounter: Payer: Self-pay | Admitting: Family

## 2018-05-08 ENCOUNTER — Other Ambulatory Visit: Payer: Self-pay

## 2018-05-08 ENCOUNTER — Observation Stay
Admission: EM | Admit: 2018-05-08 | Discharge: 2018-05-09 | Disposition: A | Discharge status: Home / Self Care | Payer: Medicare Other | Attending: Internal Medicine | Admitting: Internal Medicine

## 2018-05-08 ENCOUNTER — Ambulatory Visit: Payer: Medicare Other | Admitting: Family

## 2018-05-08 VITALS — BP 142/86 | HR 103 | Resp 18 | Ht 64.0 in | Wt 203.5 lb

## 2018-05-08 DIAGNOSIS — E876 Hypokalemia: Secondary | ICD-10-CM | POA: Insufficient documentation

## 2018-05-08 DIAGNOSIS — R778 Other specified abnormalities of plasma proteins: Secondary | ICD-10-CM

## 2018-05-08 DIAGNOSIS — K7460 Unspecified cirrhosis of liver: Secondary | ICD-10-CM

## 2018-05-08 DIAGNOSIS — Z9119 Patient's noncompliance with other medical treatment and regimen: Secondary | ICD-10-CM | POA: Diagnosis not present

## 2018-05-08 DIAGNOSIS — E119 Type 2 diabetes mellitus without complications: Secondary | ICD-10-CM

## 2018-05-08 DIAGNOSIS — I420 Dilated cardiomyopathy: Secondary | ICD-10-CM | POA: Diagnosis not present

## 2018-05-08 DIAGNOSIS — R0789 Other chest pain: Secondary | ICD-10-CM | POA: Diagnosis not present

## 2018-05-08 DIAGNOSIS — I5022 Chronic systolic (congestive) heart failure: Secondary | ICD-10-CM

## 2018-05-08 DIAGNOSIS — K7030 Alcoholic cirrhosis of liver without ascites: Secondary | ICD-10-CM | POA: Diagnosis not present

## 2018-05-08 DIAGNOSIS — Z8249 Family history of ischemic heart disease and other diseases of the circulatory system: Secondary | ICD-10-CM | POA: Insufficient documentation

## 2018-05-08 DIAGNOSIS — R7989 Other specified abnormal findings of blood chemistry: Secondary | ICD-10-CM | POA: Insufficient documentation

## 2018-05-08 DIAGNOSIS — Z950 Presence of cardiac pacemaker: Secondary | ICD-10-CM | POA: Insufficient documentation

## 2018-05-08 DIAGNOSIS — Z794 Long term (current) use of insulin: Secondary | ICD-10-CM | POA: Diagnosis not present

## 2018-05-08 DIAGNOSIS — I110 Hypertensive heart disease with heart failure: Secondary | ICD-10-CM | POA: Insufficient documentation

## 2018-05-08 DIAGNOSIS — Z87891 Personal history of nicotine dependence: Secondary | ICD-10-CM | POA: Insufficient documentation

## 2018-05-08 DIAGNOSIS — I2510 Atherosclerotic heart disease of native coronary artery without angina pectoris: Secondary | ICD-10-CM

## 2018-05-08 DIAGNOSIS — K589 Irritable bowel syndrome without diarrhea: Secondary | ICD-10-CM | POA: Insufficient documentation

## 2018-05-08 DIAGNOSIS — F909 Attention-deficit hyperactivity disorder, unspecified type: Secondary | ICD-10-CM

## 2018-05-08 DIAGNOSIS — G4700 Insomnia, unspecified: Secondary | ICD-10-CM | POA: Insufficient documentation

## 2018-05-08 DIAGNOSIS — G2581 Restless legs syndrome: Secondary | ICD-10-CM

## 2018-05-08 DIAGNOSIS — R079 Chest pain, unspecified: Secondary | ICD-10-CM | POA: Insufficient documentation

## 2018-05-08 DIAGNOSIS — K529 Noninfective gastroenteritis and colitis, unspecified: Secondary | ICD-10-CM | POA: Insufficient documentation

## 2018-05-08 DIAGNOSIS — Z95810 Presence of automatic (implantable) cardiac defibrillator: Secondary | ICD-10-CM | POA: Diagnosis not present

## 2018-05-08 DIAGNOSIS — J45909 Unspecified asthma, uncomplicated: Secondary | ICD-10-CM | POA: Insufficient documentation

## 2018-05-08 DIAGNOSIS — K580 Irritable bowel syndrome with diarrhea: Secondary | ICD-10-CM | POA: Insufficient documentation

## 2018-05-08 DIAGNOSIS — R9431 Abnormal electrocardiogram [ECG] [EKG]: Secondary | ICD-10-CM

## 2018-05-08 DIAGNOSIS — G4733 Obstructive sleep apnea (adult) (pediatric): Secondary | ICD-10-CM | POA: Insufficient documentation

## 2018-05-08 DIAGNOSIS — Z79899 Other long term (current) drug therapy: Secondary | ICD-10-CM | POA: Insufficient documentation

## 2018-05-08 DIAGNOSIS — N183 Chronic kidney disease, stage 3 (moderate): Secondary | ICD-10-CM | POA: Diagnosis not present

## 2018-05-08 DIAGNOSIS — F319 Bipolar disorder, unspecified: Secondary | ICD-10-CM | POA: Insufficient documentation

## 2018-05-08 DIAGNOSIS — F4310 Post-traumatic stress disorder, unspecified: Secondary | ICD-10-CM

## 2018-05-08 DIAGNOSIS — I10 Essential (primary) hypertension: Secondary | ICD-10-CM

## 2018-05-08 DIAGNOSIS — D631 Anemia in chronic kidney disease: Secondary | ICD-10-CM | POA: Insufficient documentation

## 2018-05-08 DIAGNOSIS — E1122 Type 2 diabetes mellitus with diabetic chronic kidney disease: Secondary | ICD-10-CM | POA: Diagnosis not present

## 2018-05-08 DIAGNOSIS — F1210 Cannabis abuse, uncomplicated: Secondary | ICD-10-CM | POA: Insufficient documentation

## 2018-05-08 DIAGNOSIS — F1021 Alcohol dependence, in remission: Secondary | ICD-10-CM | POA: Insufficient documentation

## 2018-05-08 DIAGNOSIS — I130 Hypertensive heart and chronic kidney disease with heart failure and stage 1 through stage 4 chronic kidney disease, or unspecified chronic kidney disease: Secondary | ICD-10-CM | POA: Insufficient documentation

## 2018-05-08 LAB — HEMOGLOBIN A1C
Hgb A1c MFr Bld: 8.1 % — ABNORMAL HIGH (ref 4.8–5.6)
Mean Plasma Glucose: 185.77 mg/dL

## 2018-05-08 LAB — TROPONIN I
TROPONIN I: 0.08 ng/mL — AB (ref <0.03)
Troponin I: 0.08 ng/mL (ref <0.03)

## 2018-05-08 LAB — GLUCOSE, CAPILLARY
Glucose-Capillary: 110 mg/dL — ABNORMAL HIGH (ref 70–99)
Glucose-Capillary: 190 mg/dL — ABNORMAL HIGH (ref 70–99)

## 2018-05-08 LAB — BASIC METABOLIC PANEL
Anion gap: 10 (ref 5–15)
BUN: 15 mg/dL (ref 6–20)
CHLORIDE: 107 mmol/L (ref 98–111)
CO2: 24 mmol/L (ref 22–32)
CREATININE: 1.24 mg/dL — AB (ref 0.44–1.00)
Calcium: 8.8 mg/dL — ABNORMAL LOW (ref 8.9–10.3)
GFR calc Af Amer: 56 mL/min — ABNORMAL LOW (ref >60)
GFR calc non Af Amer: 48 mL/min — ABNORMAL LOW (ref >60)
Glucose, Bld: 169 mg/dL — ABNORMAL HIGH (ref 70–99)
Potassium: 2.8 mmol/L — ABNORMAL LOW (ref 3.5–5.1)
SODIUM: 141 mmol/L (ref 135–145)

## 2018-05-08 LAB — CBC
HCT: 29.7 % — ABNORMAL LOW (ref 35.0–47.0)
Hemoglobin: 10.9 g/dL — ABNORMAL LOW (ref 12.0–16.0)
MCH: 33.1 pg (ref 26.0–34.0)
MCHC: 36.5 g/dL — ABNORMAL HIGH (ref 32.0–36.0)
MCV: 90.7 fL (ref 80.0–100.0)
PLATELETS: 202 10*3/uL (ref 150–440)
RBC: 3.28 MIL/uL — ABNORMAL LOW (ref 3.80–5.20)
RDW: 13.6 % (ref 11.5–14.5)
WBC: 6.3 10*3/uL (ref 3.6–11.0)

## 2018-05-08 LAB — MAGNESIUM: Magnesium: 1.8 mg/dL (ref 1.7–2.4)

## 2018-05-08 MED ORDER — POTASSIUM CHLORIDE CRYS ER 20 MEQ PO TBCR
40.0000 meq | EXTENDED_RELEASE_TABLET | ORAL | Status: AC
  Administered 2018-05-08 (×2): 40 meq via ORAL
  Filled 2018-05-08 (×2): qty 2

## 2018-05-08 MED ORDER — ONDANSETRON HCL 4 MG PO TABS: 4.0000 mg | ORAL_TABLET | Freq: Four times a day (QID) | ORAL | Status: DC | PRN

## 2018-05-08 MED ORDER — PAROXETINE HCL 30 MG PO TABS
30.0000 mg | ORAL_TABLET | Freq: Every day | ORAL | Status: DC
  Administered 2018-05-08 – 2018-05-09 (×2): 30 mg via ORAL
  Filled 2018-05-08 (×2): qty 1

## 2018-05-08 MED ORDER — INSULIN GLARGINE 100 UNIT/ML ~~LOC~~ SOLN
20.0000 [IU] | Freq: Every day | SUBCUTANEOUS | Status: DC
  Administered 2018-05-08: 20 [IU] via SUBCUTANEOUS
  Filled 2018-05-08 (×2): qty 0.2

## 2018-05-08 MED ORDER — ENOXAPARIN SODIUM 40 MG/0.4ML ~~LOC~~ SOLN
40.0000 mg | SUBCUTANEOUS | Status: DC
  Administered 2018-05-08: 40 mg via SUBCUTANEOUS
  Filled 2018-05-08: qty 0.4

## 2018-05-08 MED ORDER — ATORVASTATIN CALCIUM 20 MG PO TABS
40.0000 mg | ORAL_TABLET | Freq: Every day | ORAL | Status: DC
  Administered 2018-05-08: 40 mg via ORAL
  Filled 2018-05-08: qty 2

## 2018-05-08 MED ORDER — ALBUTEROL SULFATE (2.5 MG/3ML) 0.083% IN NEBU: 2.5000 mg | INHALATION_SOLUTION | RESPIRATORY_TRACT | Status: DC | PRN

## 2018-05-08 MED ORDER — DICYCLOMINE HCL 20 MG PO TABS: 20.0000 mg | ORAL_TABLET | Freq: Three times a day (TID) | ORAL | 2 refills | Status: DC | PRN

## 2018-05-08 MED ORDER — SUCRALFATE 1 G PO TABS
1.0000 g | ORAL_TABLET | Freq: Four times a day (QID) | ORAL | Status: DC
  Administered 2018-05-08 – 2018-05-09 (×3): 1 g via ORAL
  Filled 2018-05-08 (×4): qty 1

## 2018-05-08 MED ORDER — NITROGLYCERIN 0.4 MG SL SUBL
0.4000 mg | SUBLINGUAL_TABLET | SUBLINGUAL | Status: DC | PRN
  Administered 2018-05-08 – 2018-05-09 (×3): 0.4 mg via SUBLINGUAL
  Filled 2018-05-08 (×3): qty 1

## 2018-05-08 MED ORDER — ACETAMINOPHEN 325 MG PO TABS
650.0000 mg | ORAL_TABLET | Freq: Four times a day (QID) | ORAL | Status: DC | PRN
  Administered 2018-05-08: 650 mg via ORAL
  Filled 2018-05-08: qty 2

## 2018-05-08 MED ORDER — ASPIRIN 81 MG PO CHEW
324.0000 mg | CHEWABLE_TABLET | Freq: Once | ORAL | Status: AC
  Administered 2018-05-08: 324 mg via ORAL
  Filled 2018-05-08: qty 4

## 2018-05-08 MED ORDER — ACETAMINOPHEN 650 MG RE SUPP: 650.0000 mg | Freq: Four times a day (QID) | RECTAL | Status: DC | PRN

## 2018-05-08 MED ORDER — SODIUM CHLORIDE 0.9% FLUSH
3.0000 mL | Freq: Two times a day (BID) | INTRAVENOUS | Status: DC
  Administered 2018-05-08 – 2018-05-09 (×2): 3 mL via INTRAVENOUS

## 2018-05-08 MED ORDER — POTASSIUM CHLORIDE CRYS ER 20 MEQ PO TBCR: 40.0000 meq | EXTENDED_RELEASE_TABLET | Freq: Once | ORAL | Status: DC

## 2018-05-08 MED ORDER — METOCLOPRAMIDE HCL 10 MG PO TABS
10.0000 mg | ORAL_TABLET | Freq: Three times a day (TID) | ORAL | Status: DC
  Administered 2018-05-09: 10 mg via ORAL
  Filled 2018-05-08: qty 1

## 2018-05-08 MED ORDER — SODIUM CHLORIDE 0.9% FLUSH: 3.0000 mL | Freq: Two times a day (BID) | INTRAVENOUS | Status: DC

## 2018-05-08 MED ORDER — LAMOTRIGINE 25 MG PO TABS
25.0000 mg | ORAL_TABLET | Freq: Two times a day (BID) | ORAL | Status: DC
  Administered 2018-05-08 – 2018-05-09 (×2): 25 mg via ORAL
  Filled 2018-05-08 (×3): qty 1

## 2018-05-08 MED ORDER — POTASSIUM CHLORIDE CRYS ER 20 MEQ PO TBCR
40.0000 meq | EXTENDED_RELEASE_TABLET | Freq: Once | ORAL | Status: AC
  Administered 2018-05-08: 40 meq via ORAL
  Filled 2018-05-08: qty 2

## 2018-05-08 MED ORDER — INSULIN ASPART 100 UNIT/ML ~~LOC~~ SOLN
0.0000 [IU] | Freq: Three times a day (TID) | SUBCUTANEOUS | Status: DC
  Administered 2018-05-09: 3 [IU] via SUBCUTANEOUS
  Administered 2018-05-09: 2 [IU] via SUBCUTANEOUS
  Filled 2018-05-08 (×2): qty 1

## 2018-05-08 MED ORDER — ONDANSETRON HCL 4 MG/2ML IJ SOLN: 4.0000 mg | Freq: Four times a day (QID) | INTRAMUSCULAR | Status: DC | PRN

## 2018-05-08 MED ORDER — QUETIAPINE FUMARATE 300 MG PO TABS
300.0000 mg | ORAL_TABLET | Freq: Every day | ORAL | Status: DC
  Administered 2018-05-08: 300 mg via ORAL
  Filled 2018-05-08 (×2): qty 1

## 2018-05-08 MED ORDER — POLYETHYLENE GLYCOL 3350 17 G PO PACK: 17.0000 g | PACK | Freq: Every day | ORAL | Status: DC | PRN

## 2018-05-08 MED ORDER — DICYCLOMINE HCL 20 MG PO TABS
20.0000 mg | ORAL_TABLET | Freq: Three times a day (TID) | ORAL | Status: DC | PRN
  Filled 2018-05-08: qty 1

## 2018-05-08 MED ORDER — INSULIN ASPART 100 UNIT/ML ~~LOC~~ SOLN: 0.0000 [IU] | Freq: Every day | SUBCUTANEOUS | Status: DC

## 2018-05-08 MED ORDER — FUROSEMIDE 10 MG/ML IJ SOLN
40.0000 mg | Freq: Once | INTRAMUSCULAR | Status: AC
  Administered 2018-05-08: 40 mg via INTRAVENOUS
  Filled 2018-05-08: qty 4

## 2018-05-08 MED ORDER — NITROGLYCERIN 0.4 MG SL SUBL
0.4000 mg | SUBLINGUAL_TABLET | Freq: Once | SUBLINGUAL | Status: AC
  Administered 2018-05-08: 0.4 mg via SUBLINGUAL
  Filled 2018-05-08: qty 1

## 2018-05-08 MED ORDER — PANTOPRAZOLE SODIUM 40 MG PO TBEC
40.0000 mg | DELAYED_RELEASE_TABLET | Freq: Every day | ORAL | Status: DC
  Administered 2018-05-08 – 2018-05-09 (×2): 40 mg via ORAL
  Filled 2018-05-08 (×2): qty 1

## 2018-05-08 NOTE — ED Notes (Signed)
Patient is in xray now.  She has elevated troponin.  Charge rn aware .  willl move to subwait.

## 2018-05-08 NOTE — ED Notes (Signed)
Pt given snack. 

## 2018-05-08 NOTE — H&P (Signed)
[facility] [place] NAME: Bonnie Ochoa    MR#:  [id number]  DATE OF BIRTH:  [birth year]  DATE OF ADMISSION:  05/08/2018  PRIMARY CARE PHYSICIAN: Valerie Roys, DO   REQUESTING/REFERRING PHYSICIAN: Dr. Jimmye Norman  CHIEF COMPLAINT:   Chief Complaint  Patient presents with  . Chest Pain    HISTORY OF PRESENT ILLNESS:  Bonnie Ochoa  is a 52 y.o. female with a known history of hypertension, diabetes, congestive heart failure, CAD, cirrhosis, bipolar disorder presents to the emergency room complaining of nausea, vomiting, diarrhea and chest pain.  Patient complains of her symptoms for 2 days.  Patient does seem to have on and off mild chronic nausea and GERD.  Troponin elevated at 0.08 and patient is being admitted under observation.  Had a stress test greater than 5 years back which was normal. Was at CHF clinic and was sent to ER for further evaluation of the chest pain.  PAST MEDICAL HISTORY:   Past Medical History:  Diagnosis Date  . ADHD   . Arthritis   . Asthma   . Bipolar 1 disorder ([place])   . CHF (congestive heart failure) ([place])   . Cirrhosis of liver ([place])   . Coronary artery disease   . Depression   . Diabetes mellitus without complication ([place])   . Diverticulitis   . Hypertension   . IBS (irritable bowel syndrome)   . Insomnia   . Migraines   . PTSD (post-traumatic stress disorder)   . Restless leg syndrome   . Sleep apnea   . Vertigo     PAST SURGICAL HISTORY:   Past Surgical History:  Procedure Laterality Date  . ABDOMINAL HYSTERECTOMY    . debribalator  2016  . INSERT / REPLACE / REMOVE PACEMAKER    . INSERTION OF ICD    . TONSILECTOMY, ADENOIDECTOMY, BILATERAL MYRINGOTOMY AND TUBES    . TONSILLECTOMY    . TONSILLECTOMY    . TUBAL LIGATION  1980  . TUBAL LIGATION      SOCIAL HISTORY:   Social History   Tobacco Use  . Smoking status: Former Smoker    Types: Cigarettes  . Smokeless tobacco: Never  Used  . Tobacco comment: quit about 20 years ago   Substance Use Topics  . Alcohol use: Not Currently    FAMILY HISTORY:   Family History  Problem Relation Age of Onset  . Hypertension Mother   . Hyperlipidemia Mother   . Heart disease Mother   . Hypertension Sister   . Asthma Sister   . Heart disease Sister   . Diabetes Sister   . Cancer Sister   . Alzheimer's disease Maternal Grandfather   . Hyperlipidemia Brother   . Asthma Sister   . Hypertension Sister   . Diabetes Sister     DRUG ALLERGIES:   Allergies  Allergen Reactions  . Levothyroxine Rash    REVIEW OF SYSTEMS:   Review of Systems  Constitutional: Positive for malaise/fatigue. Negative for chills, fever and weight loss.  HENT: Negative for hearing loss, nosebleeds and sore throat.   Eyes: Negative for blurred vision, double vision and pain.  Respiratory: Negative for cough, hemoptysis, sputum production, shortness of breath and wheezing.   Cardiovascular: Negative for chest pain, palpitations, orthopnea and leg swelling.  Gastrointestinal: Positive for abdominal pain, nausea and vomiting. Negative for constipation, diarrhea and heartburn.  Genitourinary: Negative for dysuria and hematuria.  Musculoskeletal: Negative for back pain, falls,  joint pain and myalgias.  Skin: Negative for rash.  Neurological: Negative for dizziness, tremors, sensory change, speech change, focal weakness, seizures and headaches.  Endo/Heme/Allergies: Does not bruise/bleed easily.  Psychiatric/Behavioral: Negative for depression and memory loss. The patient is not nervous/anxious.     MEDICATIONS AT HOME:   Prior to Admission medications   Medication Sig Start Date End Date Taking? Authorizing Provider  atorvastatin (LIPITOR) 40 MG tablet TAKE 1 TABLET BY MOUTH EVERY DAY--NEEDS APPT BEFORE RUN OUT 04/25/18  Yes Johnson, Megan P, DO  blood glucose meter kit and supplies KIT Dispense based on patient and insurance preference. Use  up to four times daily as directed. (FOR ICD-9 250.00, 250.01). 07/24/17  Yes Johnson, Megan P, DO  dicyclomine (BENTYL) 20 MG tablet Take 1 tablet (20 mg total) by mouth 3 (three) times daily as needed for spasms. 05/08/18  Yes Johnson, Megan P, DO  famotidine (PEPCID) 40 MG tablet Take 1 tablet (40 mg total) by mouth every evening. 04/25/18 04/25/19 Yes Johnson, Megan P, DO  Insulin Glargine (LANTUS SOLOSTAR) 100 UNIT/ML Solostar Pen Inject 20 Units into the skin daily at 10 pm. 04/27/18  Yes Mody, Sital, MD  Insulin Pen Needle 32G X 6 MM MISC 1 each by Does not apply route daily. 04/07/18  Yes Johnson, Megan P, DO  lamoTRIgine (LAMICTAL) 25 MG tablet Take 1 tablet (25 mg total) by mouth 2 (two) times daily. 03/25/18  Yes [name], MD  metoCLOPramide (REGLAN) 10 MG tablet Take 1 tablet (10 mg total) by mouth 3 (three) times daily with meals. 05/01/18 05/01/19 Yes Johnson, Megan P, DO  pantoprazole (PROTONIX) 40 MG tablet Take 1 tablet (40 mg total) by mouth daily. 08/28/17 08/28/18 Yes [name], MD  PARoxetine (PAXIL) 30 MG tablet TAKE 1 TABLET BY MOUTH EVERY DAY 03/14/18  Yes Volney American, PA-C  QUEtiapine (SEROQUEL) 300 MG tablet TAKE 1 TABLET BY MOUTH EVERYDAY AT BEDTIME 03/14/18  Yes Volney American, PA-C  sucralfate (CARAFATE) 1 g tablet Take 1 tablet (1 g total) by mouth 4 (four) times daily. 05/01/18  Yes Johnson, Megan P, DO  neomycin-polymyxin-hydrocortisone (CORTISPORIN) OTIC solution Place 3 drops into the right ear 4 (four) times daily. Patient not taking: Reported on 05/08/2018 09/12/17   Park Liter P, DO  nitroGLYCERIN (NITROSTAT) 0.4 MG SL tablet Place 1 tablet (0.4 mg total) under the tongue every 5 (five) minutes as needed for chest pain. Patient not taking: Reported on 05/08/2018 06/04/16   Gladstone Lighter, MD  ondansetron (ZOFRAN ODT) 4 MG disintegrating tablet Take 1 tablet (4 mg total) by mouth every 8 (eight) hours as needed for nausea or vomiting. Patient not  taking: Reported on 05/08/2018 02/14/18   Nance Pear, MD     VITAL SIGNS:  Blood pressure (!) 142/103, pulse 96, temperature 98.5 F (36.9 C), temperature source Oral, resp. rate (!) 22, height 5' 4"  (1.626 m), weight 92.3 kg, SpO2 97 %.  PHYSICAL EXAMINATION:  Physical Exam  GENERAL:  52 y.o.-year-old patient lying in the bed with no acute distress.  EYES: Pupils equal, round, reactive to light and accommodation. No scleral icterus. Extraocular muscles intact.  HEENT: Head atraumatic, normocephalic. Oropharynx and nasopharynx clear. No oropharyngeal erythema, moist oral mucosa  NECK:  Supple, no jugular venous distention. No thyroid enlargement, no tenderness.  LUNGS: Normal breath sounds bilaterally, no wheezing, rales, rhonchi. No use of accessory muscles of respiration.  CARDIOVASCULAR: S1, S2 normal. No murmurs, rubs, or gallops.  ABDOMEN: Soft,  nontender, nondistended. Bowel sounds present. No organomegaly or mass.  EXTREMITIES: No pedal edema, cyanosis, or clubbing. + 2 pedal & radial pulses b/l.   NEUROLOGIC: Cranial nerves II through XII are intact. No focal Motor or sensory deficits appreciated b/l PSYCHIATRIC: The patient is alert and oriented x 3. Good affect.  SKIN: No obvious rash, lesion, or ulcer.   LABORATORY PANEL:   CBC Recent Labs  Lab 05/08/18 1218  WBC 6.3  HGB 10.9*  HCT 29.7*  PLT 202   ------------------------------------------------------------------------------------------------------------------  Chemistries  Recent Labs  Lab 05/08/18 1218  NA 141  K 2.8*  CL 107  CO2 24  GLUCOSE 169*  BUN 15  CREATININE 1.24*  CALCIUM 8.8*   ------------------------------------------------------------------------------------------------------------------  Cardiac Enzymes Recent Labs  Lab 05/08/18 1218  TROPONINI 0.08*   ------------------------------------------------------------------------------------------------------------------  RADIOLOGY:   Dg Chest 2 View  Result Date: 05/08/2018 CLINICAL DATA:  Midsternal chest pain a couple days. EXAM: CHEST - 2 VIEW COMPARISON:  04/25/2018 FINDINGS: Left-sided pacemaker unchanged. Lungs are adequately inflated without consolidation or effusion. Cardiomediastinal silhouette is within normal. Minimal degenerate change of the spine. IMPRESSION: No active cardiopulmonary disease. Electronically Signed   By: Marin Olp M.D.   On: 05/08/2018 13:29     IMPRESSION AND PLAN:   *Atypical chest pain.  Likely GERD or due to vomiting.  Due to significant risk factors and mildly abnormal troponin levels will repeat troponin and schedule patient for stress test in the morning.  Monitor on telemetry floor.  Nitro as needed.  *Gastroenteritis.  Improving.  Last episode of vomiting yesterday and one episode of diarrhea today which is significant improvement.  Afebrile.  No abdominal tenderness.  Normal WBC.  *Hypokalemia.  Likely due to vomiting and diarrhea.  Will replace orally.  *Diabetes mellitus.  Continue home dose of insulin.  Sliding scale insulin added.  *Hypertension.  Continue home medications.  *DVT prophylaxis with Lovenox  All the records are reviewed and case discussed with ED provider. Management plans discussed with the patient, family and they are in agreement.  CODE STATUS: Full code  TOTAL TIME TAKING CARE OF THIS PATIENT: 40 minutes.   Leia Alf Jeliyah Middlebrooks M.D on 05/08/2018 at 4:28 PM  Between 7am to 6pm - Pager - [phone]  After 6pm go to [email or web address] - password [email or web address]  Office  [phone]  CC: Primary care physician; Valerie Roys, DO  Note: This dictation was prepared with Dragon dictation along with smaller phrase technology. Any transcriptional errors that result from this process are unintentional.

## 2018-05-08 NOTE — ED Provider Notes (Signed)
[facility] Emergency Department Provider Note       Time seen: ----------------------------------------- 2:29 PM on 05/08/2018 -----------------------------------------   I have reviewed the triage vital signs and the nursing notes.  HISTORY   Chief Complaint Chest Pain    HPI Bonnie Ochoa is a 52 y.o. female with a history of ADHD, asthma, bipolar disorder, CHF, cirrhosis, depression, diabetes, IBS, PTSD who presents to the ED for chest pain.  Patient arrives from the heart failure clinic who referred her here to be evaluated for chest pain.  Patient reports the pain started a couple of days ago.  Pain is in the mid sternum and feels like a sharp pressure.  Patient also reports the pain makes her short of breath.  She is also had some cough with sputum production, oxygen saturations were 97% on room air.   Past Medical History:  Diagnosis Date  . ADHD   . Arthritis   . Asthma   . Bipolar 1 disorder (HCC)   . CHF (congestive heart failure) (HCC)   . Cirrhosis of liver (HCC)   . Coronary artery disease   . Depression   . Diabetes mellitus without complication (HCC)   . Diverticulitis   . Hypertension   . IBS (irritable bowel syndrome)   . Insomnia   . Migraines   . PTSD (post-traumatic stress disorder)   . Restless leg syndrome   . Sleep apnea   . Vertigo     Patient Active Problem List   Diagnosis Date Noted  . Acute renal failure (ARF) (HCC) 04/25/2018  . Hypothyroid 04/30/2017  . HTN (hypertension) 03/11/2017  . Multiple thyroid nodules 01/29/2017  . Chronic systolic heart failure (HCC) 10/08/2016  . Obstructive sleep apnea 10/08/2016  . Noncompliance 09/18/2016  . Cirrhosis (HCC) 09/18/2016  . Pulmonary hypertension (HCC) 05/30/2016  . Hypokalemia 05/30/2016  . NICM (nonischemic cardiomyopathy) (HCC) 05/30/2016  . Other ascites   . Benign hypertensive renal disease   . DM (diabetes mellitus), type 2 with renal complications (HCC)    . Bipolar 1 disorder (HCC)   . Asthma   . Generalized abdominal pain 05/07/2016    Past Surgical History:  Procedure Laterality Date  . ABDOMINAL HYSTERECTOMY    . debribalator  2016  . INSERT / REPLACE / REMOVE PACEMAKER    . INSERTION OF ICD    . TONSILECTOMY, ADENOIDECTOMY, BILATERAL MYRINGOTOMY AND TUBES    . TONSILLECTOMY    . TONSILLECTOMY    . TUBAL LIGATION  1980  . TUBAL LIGATION      Allergies Levothyroxine  Social History Social History   Tobacco Use  . Smoking status: Former Smoker    Types: Cigarettes  . Smokeless tobacco: Never Used  . Tobacco comment: quit about 20 years ago   Substance Use Topics  . Alcohol use: Not Currently  . Drug use: Yes    Frequency: 1.0 times per week    Types: Marijuana   Review of Systems Constitutional: Negative for fever. Cardiovascular: Positive for chest pain Respiratory: Negative for shortness of breath. Gastrointestinal: Negative for abdominal pain, vomiting and diarrhea. Musculoskeletal: Negative for back pain. Skin: Negative for rash. Neurological: Negative for headaches, focal weakness or numbness.  All systems negative/normal/unremarkable except as stated in the HPI  ____________________________________________   PHYSICAL EXAM:  VITAL SIGNS: ED Triage Vitals [05/08/18 1215]  Enc Vitals Group     BP 135/87     Pulse Rate 99     Resp 18  Temp 98.5 F (36.9 C)     Temp Source Oral     SpO2 99 %     Weight 203 lb 8 oz (92.3 kg)     Height 5\' 4"  (1.626 m)     Head Circumference      Peak Flow      Pain Score 10     Pain Loc      Pain Edu?      Excl. in GC?    Constitutional: Alert and oriented. Well appearing and in no distress. Eyes: Conjunctivae are normal. Normal extraocular movements. ENT   Head: Normocephalic and atraumatic.   Nose: No congestion/rhinnorhea.   Mouth/Throat: Mucous membranes are moist.   Neck: No stridor. Cardiovascular: Normal rate, regular rhythm. No  murmurs, rubs, or gallops. Respiratory: Normal respiratory effort without tachypnea nor retractions. Breath sounds are clear and equal bilaterally. No wheezes/rales/rhonchi. Gastrointestinal: Soft and nontender. Normal bowel sounds Musculoskeletal: Nontender with normal range of motion in extremities. No lower extremity tenderness nor edema. Neurologic:  Normal speech and language. No gross focal neurologic deficits are appreciated.  Skin:  Skin is warm, dry and intact. No rash noted. Psychiatric: Mood and affect are normal. Speech and behavior are normal.  ____________________________________________  EKG: Interpreted by me.  Sinus rhythm the rate of 95 bpm, normal PR interval, normal QRS, normal QT  ____________________________________________  ED COURSE:  As part of my medical decision making, I reviewed the following data within the electronic MEDICAL RECORD NUMBER History obtained from family if available, nursing notes, old chart and ekg, as well as notes from prior ED visits. Patient presented for chest pain, we will assess with labs and imaging as indicated at this time.   Procedures ____________________________________________   LABS (pertinent positives/negatives)  Labs Reviewed  BASIC METABOLIC PANEL - Abnormal; Notable for the following components:      Result Value   Potassium 2.8 (*)    Glucose, Bld 169 (*)    Creatinine, Ser 1.24 (*)    Calcium 8.8 (*)    GFR calc non Af Amer 48 (*)    GFR calc Af Amer 56 (*)    All other components within normal limits  CBC - Abnormal; Notable for the following components:   RBC 3.28 (*)    Hemoglobin 10.9 (*)    HCT 29.7 (*)    MCHC 36.5 (*)    All other components within normal limits  TROPONIN I - Abnormal; Notable for the following components:   Troponin I 0.08 (*)    All other components within normal limits    RADIOLOGY  Chest x-ray is normal  ____________________________________________  DIFFERENTIAL DIAGNOSIS    Unstable angina, MI, PE, pneumothorax, end-stage renal disease  FINAL ASSESSMENT AND PLAN  Chest pain, elevated troponin   Plan: The patient had presented for chest pain.  Patient is labs did reveal a significant increase from her typical creatinine of 0.03 2.08 today patient's imaging was unremarkable.  She was given a one-time dose of potassium for a slightly low potassium level here.  She was also given aspirin and nitroglycerin. I will discuss with the hospitalist for admission.   Ulice Dash, MD   Note: This note was generated in part or whole with voice recognition software. Voice recognition is usually quite accurate but there are transcription errors that can and very often do occur. I apologize for any typographical errors that were not detected and corrected.     Emily Filbert, MD  05/08/18 1438  

## 2018-05-08 NOTE — Progress Notes (Signed)
Patient ID: Airel Magadan, female    DOB: 1966/02/26, 52 y.o.   MRN: [id number]  HPI  Ms Housley is a 52 y/o female with a history of obstructive sleep apnea, restless leg syndrome, PTSD, HTN, DM, depression, CAD, cirrhosis, bipolar, asthma, marijuana use and chronic heart failure.   Echo report from 07/03/17 reviewed and showed an EF of 20-25%. Echo was done 05/28/16 and showed an EF of 20-25% along with moderate MR/TR and severely elevated PA pressure of 64 mm Hg.    Admitted 04/25/18 due to acute renal failure after vomiting and diarrhea and continued diuretic usage. IV fluids were given and medications held. Discharged after 2 days. Was in the ED 02/14/18 due to gastritis. Abdominal ultrasound negative for stones. Released same day. Was in the ED 02/09/18 due to abdominal pain. Abdominal/pelvic CT were negative. Released same day. Was in the ED 11/15/17 due to chest pain where she was evaluated and released.    She presents today for her follow-up visit with a chief complaint of constant chest pain. She describes this as acute in nature having been present for the last 2 days. Taking a deep breath makes it worse and nothing seems to make it better. She says the pain is located midsternal with radiation towards left upper chest and shoulder. She has associated fatigue, cough, headaches, light-headedness, palpitations and nausea along with this. She denies any difficulty sleeping, abdominal distention, pedal edema, shortness of breath or weight gain. She has been off her HF medications since most recent admission as she was told to hold them at discharge.   Past Medical History:  Diagnosis Date  . ADHD   . Arthritis   . Asthma   . Bipolar 1 disorder ([place])   . CHF (congestive heart failure) ([place])   . Cirrhosis of liver ([place])   . Coronary artery disease   . Depression   . Diabetes mellitus without complication ([place])   . Diverticulitis   . Hypertension   . IBS (irritable bowel syndrome)   . Insomnia    . Migraines   . PTSD (post-traumatic stress disorder)   . Restless leg syndrome   . Sleep apnea   . Vertigo    Past Surgical History:  Procedure Laterality Date  . ABDOMINAL HYSTERECTOMY    . debribalator  2016  . INSERT / REPLACE / REMOVE PACEMAKER    . INSERTION OF ICD    . TONSILECTOMY, ADENOIDECTOMY, BILATERAL MYRINGOTOMY AND TUBES    . TONSILLECTOMY    . TONSILLECTOMY    . TUBAL LIGATION  1980  . TUBAL LIGATION     Family History  Problem Relation Age of Onset  . Hypertension Mother   . Hyperlipidemia Mother   . Heart disease Mother   . Hypertension Sister   . Asthma Sister   . Heart disease Sister   . Diabetes Sister   . Cancer Sister   . Alzheimer's disease Maternal Grandfather   . Hyperlipidemia Brother   . Asthma Sister   . Hypertension Sister   . Diabetes Sister    Social History   Tobacco Use  . Smoking status: Former Smoker    Types: Cigarettes  . Smokeless tobacco: Never Used  . Tobacco comment: quit about 20 years ago   Substance Use Topics  . Alcohol use: Not Currently   Allergies  Allergen Reactions  . Levothyroxine Rash   Prior to Admission medications   Medication Sig Start Date End Date Taking? Authorizing Provider  atorvastatin (  LIPITOR) 40 MG tablet TAKE 1 TABLET BY MOUTH EVERY DAY--NEEDS APPT BEFORE RUN OUT 04/25/18  Yes Johnson, Megan P, DO  blood glucose meter kit and supplies KIT Dispense based on patient and insurance preference. Use up to four times daily as directed. (FOR ICD-9 250.00, 250.01). 07/24/17  Yes Johnson, Megan P, DO  dicyclomine (BENTYL) 20 MG tablet Take 1 tablet (20 mg total) by mouth 3 (three) times daily as needed for spasms. 05/08/18  Yes Johnson, Megan P, DO  famotidine (PEPCID) 40 MG tablet Take 1 tablet (40 mg total) by mouth every evening. 04/25/18 04/25/19 Yes Johnson, Megan P, DO  Insulin Glargine (LANTUS SOLOSTAR) 100 UNIT/ML Solostar Pen Inject 20 Units into the skin daily at 10 pm. 04/27/18  Yes Mody, Sital, MD   Insulin Pen Needle 32G X 6 MM MISC 1 each by Does not apply route daily. 04/07/18  Yes Johnson, Megan P, DO  lamoTRIgine (LAMICTAL) 25 MG tablet Take 1 tablet (25 mg total) by mouth 2 (two) times daily. 03/25/18  Yes [name], MD  metoCLOPramide (REGLAN) 10 MG tablet Take 1 tablet (10 mg total) by mouth 3 (three) times daily with meals. 05/01/18 05/01/19 Yes Johnson, Megan P, DO  neomycin-polymyxin-hydrocortisone (CORTISPORIN) OTIC solution Place 3 drops into the right ear 4 (four) times daily. Patient not taking: Reported on 05/08/2018 09/12/17  Yes Johnson, Megan P, DO  nitroGLYCERIN (NITROSTAT) 0.4 MG SL tablet Place 1 tablet (0.4 mg total) under the tongue every 5 (five) minutes as needed for chest pain. Patient not taking: Reported on 05/08/2018 06/04/16  Yes Gladstone Lighter, MD  ondansetron (ZOFRAN ODT) 4 MG disintegrating tablet Take 1 tablet (4 mg total) by mouth every 8 (eight) hours as needed for nausea or vomiting. Patient not taking: Reported on 05/08/2018 02/14/18  Yes Nance Pear, MD  pantoprazole (PROTONIX) 40 MG tablet Take 1 tablet (40 mg total) by mouth daily. 08/28/17 08/28/18 Yes [name], MD  PARoxetine (PAXIL) 30 MG tablet TAKE 1 TABLET BY MOUTH EVERY DAY 03/14/18  Yes Volney American, PA-C  QUEtiapine (SEROQUEL) 300 MG tablet TAKE 1 TABLET BY MOUTH EVERYDAY AT BEDTIME 03/14/18  Yes Volney American, PA-C  sucralfate (CARAFATE) 1 g tablet Take 1 tablet (1 g total) by mouth 4 (four) times daily. 05/01/18  Yes Johnson, Megan P, DO  carvedilol (COREG) 6.25 MG tablet Take 1 tablet (6.25 mg total) by mouth 2 (two) times daily with a meal. 05/09/18   Max Sane, MD    Review of Systems  Constitutional: Positive for fatigue ("better"). Negative for appetite change.  HENT: Positive for congestion. Negative for ear pain, nosebleeds and sore throat.   Eyes: Negative.   Respiratory: Positive for cough and chest tightness (pressure with radiation to the left). Negative  for shortness of breath.   Cardiovascular: Positive for chest pain (last 2 days constantly) and palpitations. Negative for leg swelling.  Gastrointestinal: Positive for nausea. Negative for abdominal distention and abdominal pain.  Endocrine: Negative.   Genitourinary: Negative.   Musculoskeletal: Positive for back pain. Negative for arthralgias and neck pain.  Skin: Negative.   Allergic/Immunologic: Negative.   Neurological: Positive for light-headedness and headaches. Negative for dizziness.  Hematological: Negative for adenopathy. Does not bruise/bleed easily.  Psychiatric/Behavioral: Negative for dysphoric mood, sleep disturbance (sleeping on 2 pillows) and suicidal ideas. The patient is not nervous/anxious.    Vitals:   05/08/18 1121  BP: (!) 142/86  Pulse: (!) 103  Resp: 18  SpO2: 98%  Weight: 203 lb 8 oz (92.3 kg)  Height: 5' 4"  (1.626 m)   Wt Readings from Last 3 Encounters:  05/08/18 203 lb 8 oz (92.3 kg)  05/01/18 203 lb 1 oz (92.1 kg)  04/25/18 194 lb 0.1 oz (88 kg)   Lab Results  Component Value Date   CREATININE 1.88 (H) 05/01/2018   CREATININE 2.26 (H) 04/27/2018   CREATININE 2.76 (H) 04/26/2018    Physical Exam  Constitutional: She is oriented to person, place, and time. She appears well-developed and well-nourished.  HENT:  Head: Normocephalic and atraumatic.  Neck: Normal range of motion. Neck supple. No JVD present.  Cardiovascular: Regular rhythm. Tachycardia present.  Pulmonary/Chest: Effort normal. She has no wheezes. She has no rales.  Abdominal: Soft. She exhibits no distension. There is no tenderness.  Musculoskeletal: She exhibits no edema or tenderness.  Chest pain is not worse when anterior chest wall is palpated  Neurological: She is alert and oriented to person, place, and time.  Skin: Skin is warm and dry.  Psychiatric: She has a normal mood and affect. Her behavior is normal. Thought content normal.  Nursing note and vitals  reviewed.  Assessment & Plan:  1: Chronic heart failure with reduced ejection fraction- - NYHA class II - euvolemic - weighing daily; reminded to call for an overnight weight gain of >2 pounds or a weekly weight gain of >5 pounds - weight similar to last visit here 2 weeks ago - not adding salt to her food and is trying to eat low sodium foods. Continues to read food labels so that she can keep her sodium intake to <203m daily. - walking daily about a mile - Has been trying to drink around 5-6 cups (8oz) of water a day. Discussed the importance of maintaining her fluid intake between 40-48 ounces of fluid daily.  - has been off entresto and beta-blocker since most recent admission - saw cardiologist (Rockey Situ 12/09/17 - saw EP (Caryl Comes 08/01/17 - BNP 09/17/16 was >4500.0 - has received her flu vaccine for this season already  2: HTN- - BP elevated but she's been off medications and is currently experiencing chest pain - saw PCP (Wynetta Emery 05/01/18 - BMP from 05/01/18 reviewed and showed sodium 142, potassium 3.3, creatinine 1.88 and GFR 34 - saw nephrology (Holley Raring recently  3: Chest pain- - EKG done in the office shows non-specific changes which appears different from previous EKG - as pain has been present for the last 2 days along with radiation and nausea, will send patient to ED for evaluation  Medication list was reviewed.   Return visit will be pending ED disposition.

## 2018-05-08 NOTE — Plan of Care (Addendum)
  Patient had an episode of chest pain at the beginning of the shift. Relieved with 1 sublingual nitroglycerin. Patient did complains of dizziness at one point after going to the restroom. Spouse is at the bedside. Will continue to monitor.

## 2018-05-08 NOTE — Patient Instructions (Signed)
Continue weighing daily and call for an overnight weight gain of > 2 pounds or a weekly weight gain of >5 pounds. 

## 2018-05-08 NOTE — ED Triage Notes (Signed)
Pt arrived from cardiologist appt who referred pt be evaluated for her chest pain. Pt states the pain started " a couple days ago." Pt states the pain is mid sternum and feels like sharp pressure. Pt also reports the pain makes her short of breath. PT's oxygen saturation 97% on room air.

## 2018-05-08 NOTE — ED Notes (Signed)
Report to Jackquline Bosch, RN

## 2018-05-08 NOTE — Progress Notes (Signed)
.   Advance care planning  Purpose of Encounter Discussed regarding chest pain, admission and plan of treatment.  Discussed CODE STATUS  Parties in Attendance Patient and wife at bedside  Patients Decisional capacity Alert and oriented.  Able to make medical decisions.  Discussed with patient regarding chest pain and acute hospitalization and treatment plan.  All questions answered.  CODE STATUS discussed and patient wants to be a full code full scope of treatment.  Orders entered.  Time spent - 18 minutes

## 2018-05-09 ENCOUNTER — Encounter: Payer: Self-pay | Admitting: Family

## 2018-05-09 ENCOUNTER — Observation Stay (HOSPITAL_BASED_OUTPATIENT_CLINIC_OR_DEPARTMENT_OTHER): Payer: Medicare Other

## 2018-05-09 ENCOUNTER — Encounter: Payer: Self-pay | Admitting: Radiology

## 2018-05-09 DIAGNOSIS — R079 Chest pain, unspecified: Secondary | ICD-10-CM

## 2018-05-09 DIAGNOSIS — K7030 Alcoholic cirrhosis of liver without ascites: Secondary | ICD-10-CM

## 2018-05-09 DIAGNOSIS — I420 Dilated cardiomyopathy: Secondary | ICD-10-CM | POA: Diagnosis not present

## 2018-05-09 DIAGNOSIS — E119 Type 2 diabetes mellitus without complications: Secondary | ICD-10-CM | POA: Diagnosis not present

## 2018-05-09 DIAGNOSIS — R0789 Other chest pain: Secondary | ICD-10-CM | POA: Diagnosis not present

## 2018-05-09 DIAGNOSIS — I10 Essential (primary) hypertension: Secondary | ICD-10-CM | POA: Diagnosis not present

## 2018-05-09 DIAGNOSIS — K529 Noninfective gastroenteritis and colitis, unspecified: Secondary | ICD-10-CM | POA: Diagnosis not present

## 2018-05-09 LAB — BASIC METABOLIC PANEL
ANION GAP: 10 (ref 5–15)
BUN: 17 mg/dL (ref 6–20)
CHLORIDE: 105 mmol/L (ref 98–111)
CO2: 25 mmol/L (ref 22–32)
Calcium: 8.8 mg/dL — ABNORMAL LOW (ref 8.9–10.3)
Creatinine, Ser: 1.23 mg/dL — ABNORMAL HIGH (ref 0.44–1.00)
GFR calc Af Amer: 57 mL/min — ABNORMAL LOW (ref >60)
GFR, EST NON AFRICAN AMERICAN: 49 mL/min — AB (ref >60)
GLUCOSE: 160 mg/dL — AB (ref 70–99)
POTASSIUM: 3.1 mmol/L — AB (ref 3.5–5.1)
Sodium: 140 mmol/L (ref 135–145)

## 2018-05-09 LAB — LIPID PANEL
Cholesterol: 168 mg/dL (ref 0–200)
HDL: 31 mg/dL — ABNORMAL LOW (ref >40)
LDL CALC: 84 mg/dL (ref 0–99)
Total CHOL/HDL Ratio: 5.4 RATIO
Triglycerides: 265 mg/dL — ABNORMAL HIGH (ref <150)
VLDL: 53 mg/dL — AB (ref 0–40)

## 2018-05-09 LAB — GLUCOSE, CAPILLARY
GLUCOSE-CAPILLARY: 141 mg/dL — AB (ref 70–99)
Glucose-Capillary: 161 mg/dL — ABNORMAL HIGH (ref 70–99)

## 2018-05-09 LAB — NM MYOCAR MULTI W/SPECT W/WALL MOTION / EF
CHL CUP NUCLEAR SSS: 17
CSEPHR: 75 %
LV sys vol: 154 mL
LVDIAVOL: 198 mL (ref 46–106)
NUC STRESS TID: 0.82
Peak HR: 126 {beats}/min
Rest HR: 109 {beats}/min
SDS: 1
SRS: 26

## 2018-05-09 LAB — MAGNESIUM: Magnesium: 1.9 mg/dL (ref 1.7–2.4)

## 2018-05-09 LAB — POTASSIUM: Potassium: 3.3 mmol/L — ABNORMAL LOW (ref 3.5–5.1)

## 2018-05-09 LAB — TROPONIN I: Troponin I: 0.07 ng/mL (ref <0.03)

## 2018-05-09 MED ORDER — AMLODIPINE BESYLATE 10 MG PO TABS
10.0000 mg | ORAL_TABLET | Freq: Every day | ORAL | Status: DC
  Filled 2018-05-09: qty 1

## 2018-05-09 MED ORDER — POTASSIUM CHLORIDE CRYS ER 20 MEQ PO TBCR
60.0000 meq | EXTENDED_RELEASE_TABLET | Freq: Once | ORAL | Status: AC
  Administered 2018-05-09: 60 meq via ORAL
  Filled 2018-05-09: qty 3

## 2018-05-09 MED ORDER — CARVEDILOL 6.25 MG PO TABS
6.2500 mg | ORAL_TABLET | Freq: Two times a day (BID) | ORAL | Status: DC
  Administered 2018-05-09: 6.25 mg via ORAL
  Filled 2018-05-09: qty 1

## 2018-05-09 MED ORDER — TECHNETIUM TC 99M TETROFOSMIN IV KIT
30.0000 | PACK | Freq: Once | INTRAVENOUS | Status: AC | PRN
  Administered 2018-05-09: 29.57 via INTRAVENOUS

## 2018-05-09 MED ORDER — REGADENOSON 0.4 MG/5ML IV SOLN
0.4000 mg | Freq: Once | INTRAVENOUS | Status: AC
  Administered 2018-05-09: 0.4 mg via INTRAVENOUS

## 2018-05-09 MED ORDER — HYDRALAZINE HCL 20 MG/ML IJ SOLN: 10.0000 mg | Freq: Four times a day (QID) | INTRAMUSCULAR | Status: DC | PRN

## 2018-05-09 MED ORDER — TECHNETIUM TC 99M TETROFOSMIN IV KIT
10.0000 | PACK | Freq: Once | INTRAVENOUS | Status: AC | PRN
  Administered 2018-05-09: 11.21 via INTRAVENOUS

## 2018-05-09 MED ORDER — CARVEDILOL 6.25 MG PO TABS: 6.2500 mg | ORAL_TABLET | Freq: Two times a day (BID) | ORAL | 0 refills | Status: DC

## 2018-05-09 NOTE — Progress Notes (Signed)
Discharged to home with her wife.  Coreg prescription sent to [facility] on [address].

## 2018-05-09 NOTE — Care Management Note (Signed)
Case Management Note  Patient Details  Name: Glorious Saffer MRN: [id number] Date of Birth: [birth year]  Subjective/Objective:       Patient from home with chest pain.  Chronic CHF, DM.  Stress test completed this morning.  Mild elevated troponin.    Patient denies difficulty obtaining medications.  No issues with transportation.  Does not have a working scale at home.  She says it would be difficult  For her to afford a scale at this time.    Current with PCP and heart failure clinic.  On room air.  No current services in the home.  No needs identified at this time.      Action/Plan: RNCM provided a scale.   Expected Discharge Date:  05/09/18               Expected Discharge Plan:  Home/Self Care  In-House Referral:     Discharge planning Services  CM Consult, HF Clinic  Post Acute Care Choice:    Choice offered to:     DME Arranged:    DME Agency:     HH Arranged:    HH Agency:     Status of Service:  Completed, signed off  If discussed at [employer] of Stay Meetings, dates discussed:    Additional Comments:  Sherren Kerns, RN 05/09/2018, 12:55 PM

## 2018-05-09 NOTE — Consult Note (Signed)
Cardiology Consultation:   Patient ID: Bonnie Ochoa; [id number]; 02/12/1966   Admit date: 05/08/2018 Date of Consult: 05/09/2018  Primary Care Provider: Valerie Roys, DO Primary Cardiologist: Rockey Situ Primary Electrophysiologist:  Caryl Comes   Patient Profile:   Bonnie Ochoa is a 52 y.o. female with a hx of chronic systolic CHF due to NICM s/p ICD, pulmonary hypertension, moderate mitral regurgitation, polysubstance abuse, noncompliance, bipolar, anxiety, cirrhosis with ascites requiring prior paracentesis, DM, HTN, asthma, OSA, RLS, and anemia who is being seen today for the evaluation of chest pain at the request of Dr. Bridgett Larsson.  History of Present Illness:   Bonnie Ochoa previously resided in [place] until 2017. Admitted 05/2016 with acute on chronic systolic CHF. Underwent 20 L of diuresis. Echo at that time showed EF < 25%, diffuse HK, DD, moderate MR, moderately dilated LA, mildly dilated RA, PASP 64 mmHg, trivial pericardial effusion. Admitted to the hospital in 08/2016 with numerous complaints including chest pain, SOB, abdominal pain, dysuria, urinary frequency, syncope, lightheadedness, nausea, vomiting, and diarrhea. Underwent successful paracentesis. Readmitted 09/2016 with chest and abdominal pain in the setting of medication noncompliance. Admitted in 06/2017 with poorly controlled diabetes and acute on CKD in the setting of reported noncompliance. Seen in the ED in 11/2017 with chest pain with a minimal troponin of 0.03. She was discharged to outpatient follow up. She has bene undergoing a "diet" lately. Seen in the ED in 6 and 02/2018 for nausea, vomiting and diarrhea.   Patient was recently admitted to [facility] in mid 04/2018 with nausea and vomiting leading to acute on CKD stage III with a bump in her SCr to > 3 and well as hypokalemia. It appears all of her evidence-based heart failure medications were held at discharge. Cardiology was not consulted to weigh in on this. She was discharged  on 9/15. She has continued to have diffuse diarrhea and nausea without vomiting along with a cough with associated nasal congestion for the past 3-4 days. Cough is productive of sputum, though she is not certain of the color. In this setting, she has developed some chest tightness. She was seen by the [facility] on 9/26 for routine follow up and was sent to the ED for further evaluation.   Upon the patient's arrival to [facility] they were found to have stable vitals, oxygen saturation 99% on room air, weight 92.3 kg. EKG as below, CXR showed no active process. Labs showed troponin 0.08-->0.08-->0.07, Scr 1.24-->1.23 with a baseline ~ that ranges from 1.1-1.6, K+ 2.8-->3.1-->, WBC 6.3, HGB 10.9, PLT 202, Mg++1.8, LDL 84, A1c 8.1. In the ED and upon admission, the patient's potassium has been undergoing repletion. She has been given SL NTG x 3 and IV Lasix 40 mg x 1. Documented UOP of 200 mL for the admission to date. Weight trend from 92.3-->91 kg. Continues to note a cough with associated chest soreness. IM has ordered a [employer].   Past Medical History:  Diagnosis Date  . ADHD   . Arthritis   . Asthma   . Bipolar 1 disorder ([place])   . CHF (congestive heart failure) ([place])   . Cirrhosis of liver ([place])   . Coronary artery disease   . Depression   . Diabetes mellitus without complication ([place])   . Diverticulitis   . Hypertension   . IBS (irritable bowel syndrome)   . Insomnia   . Migraines   . PTSD (post-traumatic stress disorder)   . Restless leg syndrome   .  Sleep apnea   . Vertigo     Past Surgical History:  Procedure Laterality Date  . ABDOMINAL HYSTERECTOMY    . debribalator  2016  . INSERT / REPLACE / REMOVE PACEMAKER    . INSERTION OF ICD    . TONSILECTOMY, ADENOIDECTOMY, BILATERAL MYRINGOTOMY AND TUBES    . TONSILLECTOMY    . TONSILLECTOMY    . TUBAL LIGATION  1980  . TUBAL LIGATION       Home Meds: Prior to Admission medications   Medication Sig Start Date  End Date Taking? Authorizing Provider  atorvastatin (LIPITOR) 40 MG tablet TAKE 1 TABLET BY MOUTH EVERY DAY--NEEDS APPT BEFORE RUN OUT 04/25/18  Yes Johnson, Megan P, DO  blood glucose meter kit and supplies KIT Dispense based on patient and insurance preference. Use up to four times daily as directed. (FOR ICD-9 250.00, 250.01). 07/24/17  Yes Johnson, Megan P, DO  dicyclomine (BENTYL) 20 MG tablet Take 1 tablet (20 mg total) by mouth 3 (three) times daily as needed for spasms. 05/08/18  Yes Johnson, Megan P, DO  famotidine (PEPCID) 40 MG tablet Take 1 tablet (40 mg total) by mouth every evening. 04/25/18 04/25/19 Yes Johnson, Megan P, DO  Insulin Glargine (LANTUS SOLOSTAR) 100 UNIT/ML Solostar Pen Inject 20 Units into the skin daily at 10 pm. 04/27/18  Yes Mody, Sital, MD  Insulin Pen Needle 32G X 6 MM MISC 1 each by Does not apply route daily. 04/07/18  Yes Johnson, Megan P, DO  lamoTRIgine (LAMICTAL) 25 MG tablet Take 1 tablet (25 mg total) by mouth 2 (two) times daily. 03/25/18  Yes [name], MD  metoCLOPramide (REGLAN) 10 MG tablet Take 1 tablet (10 mg total) by mouth 3 (three) times daily with meals. 05/01/18 05/01/19 Yes Johnson, Megan P, DO  pantoprazole (PROTONIX) 40 MG tablet Take 1 tablet (40 mg total) by mouth daily. 08/28/17 08/28/18 Yes [name], MD  PARoxetine (PAXIL) 30 MG tablet TAKE 1 TABLET BY MOUTH EVERY DAY 03/14/18  Yes Volney American, PA-C  QUEtiapine (SEROQUEL) 300 MG tablet TAKE 1 TABLET BY MOUTH EVERYDAY AT BEDTIME 03/14/18  Yes Volney American, PA-C  sucralfate (CARAFATE) 1 g tablet Take 1 tablet (1 g total) by mouth 4 (four) times daily. 05/01/18  Yes Johnson, Megan P, DO  neomycin-polymyxin-hydrocortisone (CORTISPORIN) OTIC solution Place 3 drops into the right ear 4 (four) times daily. Patient not taking: Reported on 05/08/2018 09/12/17   Park Liter P, DO  nitroGLYCERIN (NITROSTAT) 0.4 MG SL tablet Place 1 tablet (0.4 mg total) under the tongue every 5 (five)  minutes as needed for chest pain. Patient not taking: Reported on 05/08/2018 06/04/16   Gladstone Lighter, MD  ondansetron (ZOFRAN ODT) 4 MG disintegrating tablet Take 1 tablet (4 mg total) by mouth every 8 (eight) hours as needed for nausea or vomiting. Patient not taking: Reported on 05/08/2018 02/14/18   Nance Pear, MD    Inpatient Medications: Scheduled Meds: . amLODipine  10 mg Oral Daily  . atorvastatin  40 mg Oral q1800  . enoxaparin (LOVENOX) injection  40 mg Subcutaneous Q24H  . insulin aspart  0-15 Units Subcutaneous TID WC  . insulin aspart  0-5 Units Subcutaneous QHS  . insulin glargine  20 Units Subcutaneous Q2200  . lamoTRIgine  25 mg Oral BID  . metoCLOPramide  10 mg Oral TID WC  . pantoprazole  40 mg Oral Daily  . PARoxetine  30 mg Oral Daily  . potassium chloride  60  mEq Oral Once  . QUEtiapine  300 mg Oral QHS  . sodium chloride flush  3 mL Intravenous Q12H  . sodium chloride flush  3 mL Intravenous Q12H  . sucralfate  1 g Oral QID   Continuous Infusions:  PRN Meds: acetaminophen **OR** acetaminophen, albuterol, dicyclomine, hydrALAZINE, nitroGLYCERIN, ondansetron **OR** ondansetron (ZOFRAN) IV, polyethylene glycol, technetium tetrofosmin  Allergies:   Allergies  Allergen Reactions  . Levothyroxine Rash    Social History:   Social History   Socioeconomic History  . Marital status: Married    Spouse name: Not on file  . Number of children: 2  . Years of education: Not on file  . Highest education level: High school graduate  Occupational History    Comment: disabled  Social Needs  . Financial resource strain: Not hard at all  . Food insecurity:    Worry: Sometimes true    Inability: Sometimes true  . Transportation needs:    Medical: Yes    Non-medical: Yes  Tobacco Use  . Smoking status: Former Smoker    Types: Cigarettes  . Smokeless tobacco: Never Used  . Tobacco comment: quit about 20 years ago   Substance and Sexual Activity  .  Alcohol use: Not Currently  . Drug use: Yes    Frequency: 1.0 times per week    Types: Marijuana  . Sexual activity: Yes    Birth control/protection: Surgical    Comment: one partner  Lifestyle  . Physical activity:    Days per week: 0 days    Minutes per session: 0 min  . Stress: Very much  Relationships  . Social connections:    Talks on phone: Never    Gets together: Twice a week    Attends religious service: Never    Active member of club or organization: No    Attends meetings of clubs or organizations: Never    Relationship status: Married  . Intimate partner violence:    Fear of current or ex partner: No    Emotionally abused: No    Physically abused: No    Forced sexual activity: No  Other Topics Concern  . Not on file  Social History Narrative   Volunteers occasionally      Family History:   Family History  Problem Relation Age of Onset  . Hypertension Mother   . Hyperlipidemia Mother   . Heart disease Mother   . Hypertension Sister   . Asthma Sister   . Heart disease Sister   . Diabetes Sister   . Cancer Sister   . Alzheimer's disease Maternal Grandfather   . Hyperlipidemia Brother   . Asthma Sister   . Hypertension Sister   . Diabetes Sister     ROS:  Review of Systems  Constitutional: Positive for malaise/fatigue and weight loss. Negative for chills, diaphoresis and fever.  HENT: Negative for congestion.   Eyes: Negative for discharge and redness.  Respiratory: Positive for cough, sputum production, shortness of breath and wheezing. Negative for hemoptysis.   Cardiovascular: Positive for chest pain. Negative for palpitations, orthopnea, claudication, leg swelling and PND.  Gastrointestinal: Positive for diarrhea. Negative for abdominal pain, blood in stool, constipation, heartburn, melena, nausea and vomiting.  Genitourinary: Negative for hematuria.  Musculoskeletal: Positive for falls and myalgias.  Skin: Negative for rash.  Neurological:  Positive for dizziness and weakness. Negative for tingling, tremors, sensory change, speech change, focal weakness and loss of consciousness.  Endo/Heme/Allergies: Does not bruise/bleed easily.  Psychiatric/Behavioral: Negative for  substance abuse. The patient is not nervous/anxious.   All other systems reviewed and are negative.     Physical Exam/Data:   Vitals:   05/08/18 1947 05/09/18 0332 05/09/18 0333 05/09/18 0807  BP: (!) 143/103 (!) 136/110 (!) 140/93 (!) 162/103  Pulse: (!) 108 (!) 112 (!) 110 (!) 107  Resp:  18  14  Temp:   98.4 F (36.9 C) 98.6 F (37 C)  TempSrc:    Oral  SpO2: 100% 97% 98% 96%  Weight:   91 kg   Height:        Intake/Output Summary (Last 24 hours) at 05/09/2018 0947 Last data filed at 05/09/2018 0500 Gross per 24 hour  Intake -  Output 200 ml  Net -200 ml   Filed Weights   05/08/18 1215 05/09/18 0333  Weight: 92.3 kg 91 kg   Body mass index is 34.43 kg/m.   Physical Exam: General: Well developed, well nourished, in no acute distress. Head: Normocephalic, atraumatic, sclera non-icteric, no xanthomas, nares without discharge.  Neck: Negative for carotid bruits. JVD not elevated. Lungs: Clear bilaterally to auscultation without wheezes, rales, or rhonchi. Breathing is unlabored. Heart: RRR with S1 S2. No murmurs, rubs, or gallops appreciated. Abdomen: Soft, non-tender, non-distended with normoactive bowel sounds. No hepatomegaly. No rebound/guarding. No obvious abdominal masses. Msk:  Strength and tone appear normal for age. Extremities: No clubbing or cyanosis. No edema. Distal pedal pulses are 2+ and equal bilaterally. Neuro: Alert and oriented X 3. No facial asymmetry. No focal deficit. Moves all extremities spontaneously. Psych:  Responds to questions appropriately with a normal affect.   EKG:  The EKG was personally reviewed and demonstrates: NSR, 95 bpm, nonspecific st/t changes Telemetry:  Telemetry was personally reviewed and  demonstrates: NSR  Weights: Filed Weights   05/08/18 1215 05/09/18 0333  Weight: 92.3 kg 91 kg    Relevant CV Studies: 2-D Echo 06/2017: Study Conclusions  - Procedure narrative: Transthoracic echocardiography. The study   was technically difficult. - Left ventricle: The cavity size was normal. There was moderate   concentric hypertrophy. Systolic function was severely reduced.   The estimated ejection fraction was in the range of 20% to 25%.   Diffuse hypokinesis. Regional wall motion abnormalities cannot be   excluded. - Left atrium: The atrium was mildly dilated. - Right ventricle: Systolic function was normal. - Pulmonary arteries: Systolic pressure could not be accurately   estimated.  Impressions:  - Challenging image quality. __________  Laboratory Data:  Chemistry Recent Labs  Lab 05/08/18 1218 05/09/18 0007  NA 141 140  K 2.8* 3.1*  CL 107 105  CO2 24 25  GLUCOSE 169* 160*  BUN 15 17  CREATININE 1.24* 1.23*  CALCIUM 8.8* 8.8*  GFRNONAA 48* 49*  GFRAA 56* 57*  ANIONGAP 10 10    No results for input(s): PROT, ALBUMIN, AST, ALT, ALKPHOS, BILITOT in the last 168 hours. Hematology Recent Labs  Lab 05/08/18 1218  WBC 6.3  RBC 3.28*  HGB 10.9*  HCT 29.7*  MCV 90.7  MCH 33.1  MCHC 36.5*  RDW 13.6  PLT 202   Cardiac Enzymes Recent Labs  Lab 05/08/18 1218 05/08/18 1757 05/09/18 0007  TROPONINI 0.08* 0.08* 0.07*   No results for input(s): TROPIPOC in the last 168 hours.  BNPNo results for input(s): BNP, PROBNP in the last 168 hours.  DDimer No results for input(s): DDIMER in the last 168 hours.  Radiology/Studies:  Dg Chest 2 View  Result Date: 05/08/2018  IMPRESSION: No active cardiopulmonary disease. Electronically Signed   By: Marin Olp M.D.   On: 05/08/2018 13:29    Assessment and Plan:   1. Elevated troponin: -Chest pain associated with coughing -Not surprising that she has mild troponin bump given her cardiomyopathy with  CKD -Troponin trend is not consistent with ACS -No indication for heparin gtt -IM has ordered a Myoview -Further recommendations pending Myoview  2. HFrEF secondary to NICM: -She does not appear grossly volume up -She has been off all evidence-based heart failure medications since these were held at time of her 04/27/18 discharge by IM -Stop amlodipine as below given her cardiomyopathy  -Slowly resume evidence based heart failure therapy with Coreg 6.25 mg bid today -As able, would add back Entresto and spironolactone prior to discharge   3. Hypokalemia: -Replete to goal > 4.0  4. Diarrhea with recent nausea and vomiting: -Likely exacerbating her hypokalemia  -Per IM  5. Alcoholic cirrhosis with ascites: -Per IM  6. Possible URI/bronchitis: -Exacerbating the patient's chest pain -Per IM  7. HTN: -It is unclear why she was started on amlodipine upon admission. Stop this medication given her cardiomyopathy and would not use this in the future -Add back evidence-based medications as above     For questions or updates, please contact [place] Please consult [email or web address] for contact info under Cardiology/STEMI.   Signed, Christell Faith, PA-C [place] Pager: [phone] 05/09/2018, 9:47 AM

## 2018-05-09 NOTE — Discharge Summary (Signed)
[place] [facility] [place] NAME: Bonnie Ochoa    MR#:  [id number]  DATE OF BIRTH:  Mar 09, 1966  DATE OF ADMISSION:  05/08/2018   ADMITTING PHYSICIAN: Hillary Bow, MD  DATE OF DISCHARGE: 05/09/2018  PRIMARY CARE PHYSICIAN: Valerie Roys, DO   ADMISSION DIAGNOSIS:  Elevated troponin [R74.8] Nonspecific chest pain [R07.9] DISCHARGE DIAGNOSIS:  Active Problems:   Chest pain  SECONDARY DIAGNOSIS:   Past Medical History:  Diagnosis Date  . ADHD   . Arthritis   . Asthma   . Bipolar 1 disorder ([place])   . CHF (congestive heart failure) ([place])   . Cirrhosis of liver ([place])   . Coronary artery disease   . Depression   . Diabetes mellitus without complication ([place])   . Diverticulitis   . Hypertension   . IBS (irritable bowel syndrome)   . Insomnia   . Migraines   . PTSD (post-traumatic stress disorder)   . Restless leg syndrome   . Sleep apnea   . Vertigo    HOSPITAL COURSE:  52 y.o. female with a hx of chronic systolic CHF due to NICM s/p ICD,pulmonary hypertension, moderate mitral regurgitation,polysubstance abuse, noncompliance, bipolar, anxiety, cirrhosis with ascites requiring prior paracentesis, DM, HTN, asthma, OSA, RLS, and anemia admitted for chest pain   * Chest pain: seems musculo-skeletal in nature - reproducible in nature - recommend NSAIDs as need for short time  1. Elevated troponin: due to supply demand ischemia -Chest pain associated with coughing -Not surprising that she has mild troponin bump given her cardiomyopathy with CKD -Troponin trend is not consistent with ACS -No indication for heparin gtt - Myoview showing no acute ischemia.  - known low EF 22%  2. HFrEF secondary to NICM: -She does not appear grossly volume up -She has been off all evidence-based heart failure medications since these were held at time of her 04/27/18 discharge by IM -Stopped amlodipine as below given her cardiomyopathy  -Slowly  resume evidence based heart failure therapy with Coreg 6.25 mg bid today -consider adding back Entresto and spironolactone - consider as an outpt   3. Hypokalemia: -Repleted  4. Diarrhea with recent nausea and vomiting: -Likely exacerbating her hypokalemia  - transient and improving  5. Alcoholic cirrhosis with ascites: - outpt mgmt  6. Possible URI/bronchitis: -likely viral, CXR neg for any acute patho  7. HTN: -stopped amlodipine  DISCHARGE CONDITIONS:  stable CONSULTS OBTAINED:  Treatment Team:  Minna Merritts, MD DRUG ALLERGIES:   Allergies  Allergen Reactions  . Levothyroxine Rash   DISCHARGE MEDICATIONS:   Allergies as of 05/09/2018      Reactions   Levothyroxine Rash      Medication List    TAKE these medications   atorvastatin 40 MG tablet Commonly known as:  LIPITOR TAKE 1 TABLET BY MOUTH EVERY DAY--NEEDS APPT BEFORE RUN OUT   blood glucose meter kit and supplies Kit Dispense based on patient and insurance preference. Use up to four times daily as directed. (FOR ICD-9 250.00, 250.01).   carvedilol 6.25 MG tablet Commonly known as:  COREG Take 1 tablet (6.25 mg total) by mouth 2 (two) times daily with a meal.   dicyclomine 20 MG tablet Commonly known as:  BENTYL Take 1 tablet (20 mg total) by mouth 3 (three) times daily as needed for spasms.   famotidine 40 MG tablet Commonly known as:  PEPCID Take 1 tablet (40 mg total) by mouth every evening.  Insulin Glargine 100 UNIT/ML Solostar Pen Commonly known as:  LANTUS Inject 20 Units into the skin daily at 10 pm.   Insulin Pen Needle 32G X 6 MM Misc 1 each by Does not apply route daily.   lamoTRIgine 25 MG tablet Commonly known as:  LAMICTAL Take 1 tablet (25 mg total) by mouth 2 (two) times daily.   metoCLOPramide 10 MG tablet Commonly known as:  REGLAN Take 1 tablet (10 mg total) by mouth 3 (three) times daily with meals.   neomycin-polymyxin-hydrocortisone OTIC solution Commonly  known as:  CORTISPORIN Place 3 drops into the right ear 4 (four) times daily.   nitroGLYCERIN 0.4 MG SL tablet Commonly known as:  NITROSTAT Place 1 tablet (0.4 mg total) under the tongue every 5 (five) minutes as needed for chest pain.   ondansetron 4 MG disintegrating tablet Commonly known as:  ZOFRAN-ODT Take 1 tablet (4 mg total) by mouth every 8 (eight) hours as needed for nausea or vomiting.   pantoprazole 40 MG tablet Commonly known as:  PROTONIX Take 1 tablet (40 mg total) by mouth daily.   PARoxetine 30 MG tablet Commonly known as:  PAXIL TAKE 1 TABLET BY MOUTH EVERY DAY   QUEtiapine 300 MG tablet Commonly known as:  SEROQUEL TAKE 1 TABLET BY MOUTH EVERYDAY AT BEDTIME   sucralfate 1 g tablet Commonly known as:  CARAFATE Take 1 tablet (1 g total) by mouth 4 (four) times daily.       DISCHARGE INSTRUCTIONS:   DIET:  Cardiac diet DISCHARGE CONDITION:  Good ACTIVITY:  Activity as tolerated OXYGEN:  Home Oxygen: No.  Oxygen Delivery: room air DISCHARGE LOCATION:  home   If you experience worsening of your admission symptoms, develop shortness of breath, life threatening emergency, suicidal or homicidal thoughts you must seek medical attention immediately by calling 911 or calling your MD immediately  if symptoms less severe.  You Must read complete instructions/literature along with all the possible adverse reactions/side effects for all the Medicines you take and that have been prescribed to you. Take any new Medicines after you have completely understood and accpet all the possible adverse reactions/side effects.   Please note  You were cared for by a hospitalist during your hospital stay. If you have any questions about your discharge medications or the care you received while you were in the hospital after you are discharged, you can call the unit and asked to speak with the hospitalist on call if the hospitalist that took care of you is not available. Once  you are discharged, your primary care physician will handle any further medical issues. Please note that NO REFILLS for any discharge medications will be authorized once you are discharged, as it is imperative that you return to your primary care physician (or establish a relationship with a primary care physician if you do not have one) for your aftercare needs so that they can reassess your need for medications and monitor your lab values.    On the day of Discharge:  VITAL SIGNS:  Blood pressure (!) 137/105, pulse (!) 103, temperature 98.2 F (36.8 C), temperature source Oral, resp. rate 14, height _0  (1.626 m), weight 91 kg, SpO2 96 %. PHYSICAL EXAMINATION:  GENERAL:  52 y.o.-year-old patient lying in the bed with no acute distress.  EYES: Pupils equal, round, reactive to light and accommodation. No scleral icterus. Extraocular muscles intact.  HEENT: Head atraumatic, normocephalic. Oropharynx and nasopharynx clear.  NECK:  Supple, no jugular venous  distention. No thyroid enlargement, no tenderness.  LUNGS: Normal breath sounds bilaterally, no wheezing, rales,rhonchi or crepitation. No use of accessory muscles of respiration.  CARDIOVASCULAR: S1, S2 normal. No murmurs, rubs, or gallops.  ABDOMEN: Soft, non-tender, non-distended. Bowel sounds present. No organomegaly or mass.  EXTREMITIES: No pedal edema, cyanosis, or clubbing.  NEUROLOGIC: Cranial nerves II through XII are intact. Muscle strength 5/5 in all extremities. Sensation intact. Gait not checked.  PSYCHIATRIC: The patient is alert and oriented x 3.  SKIN: No obvious rash, lesion, or ulcer.  DATA REVIEW:   CBC Recent Labs  Lab 05/08/18 1218  WBC 6.3  HGB 10.9*  HCT 29.7*  PLT 202    Chemistries  Recent Labs  Lab 05/09/18 0007 05/09/18 0500 05/09/18 1144  NA 140  --   --   K 3.1*  --  3.3*  CL 105  --   --   CO2 25  --   --   GLUCOSE 160*  --   --   BUN 17  --   --   CREATININE 1.23*  --   --   CALCIUM 8.8*   --   --   MG  --  1.9  --      Microbiology Results  Results for orders placed or performed in visit on 02/19/18  Microscopic Examination     Status: None   Collection Time: 02/19/18 11:27 AM  Result Value Ref Range Status   WBC, UA 0-5 0 - 5 /hpf Final   RBC, UA 0-2 0 - 2 /hpf Final   Epithelial Cells (non renal) 0-10 0 - 10 /hpf Final   Casts Present None seen /lpf Final   Cast Type Hyaline casts N/A Final   Bacteria, UA Few None seen/Few Final    RADIOLOGY:  Nm Myocar Multi W/spect W/wall Motion / Ef  Result Date: 05/09/2018 Pharmacological myocardial perfusion imaging study with no significant  Ischemia Fixed distal anterior septal and apical region, also fixed inferoapical region, unable to exclude old scar vs attenuation artifact Global hypokinesis,  EF estimated at 22% No EKG changes concerning for ischemia at peak stress or in recovery. High risk scan based on low ejection fraction. Low EF is known, confirmed by prior echocardiogram. Signed, Esmond Plants, MD, Ph.D [facility]     Management plans discussed with the patient, family and they are in agreement.  CODE STATUS: Full Code   TOTAL TIME TAKING CARE OF THIS PATIENT: 45 minutes.    Max Sane M.D on 05/09/2018 at 1:59 PM  Between 7am to 6pm - Pager - [phone]  After 6pm go to [email or web address] - Proofreader  [facility]  Office  [phone]  CC: Primary care physician; Valerie Roys, DO   Note: This dictation was prepared with Dragon dictation along with smaller phrase technology. Any transcriptional errors that result from this process are unintentional.

## 2018-05-09 NOTE — Discharge Instructions (Signed)
Chest Wall Pain °Chest wall pain is pain in or around the bones and muscles of your chest. Sometimes, an injury causes this pain. Sometimes, the cause may not be known. This pain may take several weeks or longer to get better. °Follow these instructions at home: °Pay attention to any changes in your symptoms. Take these actions to help with your pain: °· Rest as told by your doctor. °· Avoid activities that cause pain. Try not to use your chest, belly (abdominal), or side muscles to lift heavy things. °· If directed, apply ice to the painful area: °? Put ice in a plastic bag. °? Place a towel between your skin and the bag. °? Leave the ice on for 20 minutes, 2-3 times per day. °· Take over-the-counter and prescription medicines only as told by your doctor. °· Do not use tobacco products, including cigarettes, chewing tobacco, and e-cigarettes. If you need help quitting, ask your doctor. °· Keep all follow-up visits as told by your doctor. This is important. ° °Contact a doctor if: °· You have a fever. °· Your chest pain gets worse. °· You have new symptoms. °Get help right away if: °· You feel sick to your stomach (nauseous) or you throw up (vomit). °· You feel sweaty or light-headed. °· You have a cough with phlegm (sputum) or you cough up blood. °· You are short of breath. °This information is not intended to replace advice given to you by your health care provider. Make sure you discuss any questions you have with your health care provider. °Document Released: 01/16/2008 Document Revised: 01/05/2016 Document Reviewed: 10/25/2014 °Elsevier Interactive Patient Education © 2018 [employer] ° °

## 2018-05-12 ENCOUNTER — Telehealth: Payer: Self-pay

## 2018-05-12 NOTE — Telephone Encounter (Signed)
Transition Care Management Follow-up Telephone Call  Date of discharge and from where: 05/09/2018  How have you been since you were released from the hospital? "im feeling much better, no more chest pain since discharge"  Any questions or concerns? No    Items Reviewed:  Did the pt receive and understand the discharge instructions provided? Yes   Medications obtained and verified? Yes   Any new allergies since your discharge? No   Dietary orders reviewed? Yes  Do you have support at home? Yes   Functional Questionnaire: (I = Independent and D = Dependent) ADLs: I  Bathing/Dressing- I  Meal Prep- I   Eating- I  Maintaining continence- I  Transferring/Ambulation- I  Managing Meds- I  Follow up appointments reviewed:   PCP Hospital f/u appt? Yes  Scheduled to see Dr.Johnson on 05/22/2018 @ 2:30pm.    Hospital Specialist f/u appt confirmed? Not made  Are transportation arrangements needed? No   If their condition worsens, is the pt aware to call PCP or go to the Emergency Dept.? Yes  Was the patient provided with contact information for the PCP's office or ED? Yes  Was to pt encouraged to call back with questions or concerns? Yes

## 2018-05-12 NOTE — Telephone Encounter (Signed)
I can see her on 05/15/18 for earlier [place]. Follow up

## 2018-05-12 NOTE — Telephone Encounter (Signed)
Copied from CRM [id number]. Topic: Appointment Scheduling - Scheduling Inquiry for Clinic >> May 12, 2018  3:59 PM [place], [place] A, LPN wrote: Reason for CRM: called patient back to inform her Dr.Johnson had availablity for hospital follow up on 05/15/2018 at 4pm. Pt placed in this slot, please confirm she can make this appt. If she cannot please message me or Dr.Johnson for a new appt. time.

## 2018-05-15 ENCOUNTER — Inpatient Hospital Stay: Payer: Medicare Other | Admitting: Family Medicine

## 2018-05-19 ENCOUNTER — Other Ambulatory Visit: Payer: Self-pay

## 2018-05-19 ENCOUNTER — Encounter: Payer: Self-pay | Admitting: Emergency Medicine

## 2018-05-19 ENCOUNTER — Emergency Department: Payer: Medicare Other

## 2018-05-19 ENCOUNTER — Emergency Department
Admission: EM | Admit: 2018-05-19 | Discharge: 2018-05-19 | Disposition: A | Discharge status: Home / Self Care | Payer: Medicare Other | Attending: Emergency Medicine | Admitting: Emergency Medicine

## 2018-05-19 DIAGNOSIS — Z87891 Personal history of nicotine dependence: Secondary | ICD-10-CM | POA: Diagnosis not present

## 2018-05-19 DIAGNOSIS — Z794 Long term (current) use of insulin: Secondary | ICD-10-CM | POA: Insufficient documentation

## 2018-05-19 DIAGNOSIS — E1129 Type 2 diabetes mellitus with other diabetic kidney complication: Secondary | ICD-10-CM | POA: Insufficient documentation

## 2018-05-19 DIAGNOSIS — Z79899 Other long term (current) drug therapy: Secondary | ICD-10-CM | POA: Insufficient documentation

## 2018-05-19 DIAGNOSIS — I5022 Chronic systolic (congestive) heart failure: Secondary | ICD-10-CM | POA: Diagnosis not present

## 2018-05-19 DIAGNOSIS — R079 Chest pain, unspecified: Secondary | ICD-10-CM | POA: Diagnosis not present

## 2018-05-19 DIAGNOSIS — N189 Chronic kidney disease, unspecified: Secondary | ICD-10-CM | POA: Insufficient documentation

## 2018-05-19 DIAGNOSIS — R0602 Shortness of breath: Secondary | ICD-10-CM | POA: Diagnosis not present

## 2018-05-19 DIAGNOSIS — R0789 Other chest pain: Secondary | ICD-10-CM | POA: Insufficient documentation

## 2018-05-19 DIAGNOSIS — I130 Hypertensive heart and chronic kidney disease with heart failure and stage 1 through stage 4 chronic kidney disease, or unspecified chronic kidney disease: Secondary | ICD-10-CM | POA: Diagnosis not present

## 2018-05-19 DIAGNOSIS — J45909 Unspecified asthma, uncomplicated: Secondary | ICD-10-CM | POA: Insufficient documentation

## 2018-05-19 LAB — CBC
HEMATOCRIT: 31 % — AB (ref 35.0–47.0)
Hemoglobin: 10.4 g/dL — ABNORMAL LOW (ref 12.0–16.0)
MCH: 31.1 pg (ref 26.0–34.0)
MCHC: 33.6 g/dL (ref 32.0–36.0)
MCV: 92.4 fL (ref 80.0–100.0)
PLATELETS: 220 10*3/uL (ref 150–440)
RBC: 3.36 MIL/uL — AB (ref 3.80–5.20)
RDW: 14.4 % (ref 11.5–14.5)
WBC: 5.2 10*3/uL (ref 3.6–11.0)

## 2018-05-19 LAB — BASIC METABOLIC PANEL
ANION GAP: 10 (ref 5–15)
BUN: 15 mg/dL (ref 6–20)
CHLORIDE: 108 mmol/L (ref 98–111)
CO2: 22 mmol/L (ref 22–32)
Calcium: 8.9 mg/dL (ref 8.9–10.3)
Creatinine, Ser: 1.05 mg/dL — ABNORMAL HIGH (ref 0.44–1.00)
GFR calc Af Amer: >60 mL/min (ref >60)
GFR calc non Af Amer: 59 mL/min — ABNORMAL LOW (ref >60)
Glucose, Bld: 152 mg/dL — ABNORMAL HIGH (ref 70–99)
POTASSIUM: 3.5 mmol/L (ref 3.5–5.1)
SODIUM: 140 mmol/L (ref 135–145)

## 2018-05-19 LAB — TROPONIN I: Troponin I: 0.03 ng/mL (ref <0.03)

## 2018-05-19 MED ORDER — NITROGLYCERIN 0.4 MG SL SUBL
0.4000 mg | SUBLINGUAL_TABLET | Freq: Once | SUBLINGUAL | Status: AC
  Administered 2018-05-19: 0.4 mg via SUBLINGUAL
  Filled 2018-05-19: qty 1

## 2018-05-19 MED ORDER — NITROGLYCERIN 0.4 MG SL SUBL: 0.4000 mg | SUBLINGUAL_TABLET | SUBLINGUAL | 0 refills | Status: DC | PRN

## 2018-05-19 MED ORDER — LABETALOL HCL 5 MG/ML IV SOLN
10.0000 mg | Freq: Once | INTRAVENOUS | Status: AC
  Administered 2018-05-19: 10 mg via INTRAVENOUS
  Filled 2018-05-19: qty 4

## 2018-05-19 NOTE — ED Provider Notes (Signed)
[facility] Emergency Department Provider Note   ____________________________________________    I have reviewed the triage vital signs and the nursing notes.   HISTORY  Chief Complaint Chest Pain and Shortness of Breath     HPI Bonnie Ochoa is a 52 y.o. female who presents with complaints of chest pain and mild shortness of breath.  Patient reports recent admission to the hospital at which point she had a stress test which was unremarkable, last night she developed chest burning.  Patient reports she had nitroglycerin at home she thinks it would have been better but something went wrong with a prescription.  She denies shortness of breath at this time.  No pleurisy.  No calf pain or swelling.  Denies fevers or chills no radiation   Past Medical History:  Diagnosis Date  . ADHD   . Arthritis   . Asthma   . Bipolar 1 disorder ([place])   . CHF (congestive heart failure) ([place])   . Cirrhosis of liver ([place])   . Coronary artery disease   . Depression   . Diabetes mellitus without complication ([place])   . Diverticulitis   . Hypertension   . IBS (irritable bowel syndrome)   . Insomnia   . Migraines   . PTSD (post-traumatic stress disorder)   . Restless leg syndrome   . Sleep apnea   . Vertigo     Patient Active Problem List   Diagnosis Date Noted  . Chest pain 05/08/2018  . Acute renal failure (ARF) ([place]) 04/25/2018  . Hypothyroid 04/30/2017  . HTN (hypertension) 03/11/2017  . Multiple thyroid nodules 01/29/2017  . Chronic systolic heart failure ([place]) 10/08/2016  . Obstructive sleep apnea 10/08/2016  . Noncompliance 09/18/2016  . Cirrhosis ([place]) 09/18/2016  . Pulmonary hypertension ([place]) 05/30/2016  . Hypokalemia 05/30/2016  . NICM (nonischemic cardiomyopathy) ([place]) 05/30/2016  . Other ascites   . Benign hypertensive renal disease   . DM (diabetes mellitus), type 2 with renal complications ([place])   . Bipolar 1 disorder ([place])   . Asthma   .  Generalized abdominal pain 05/07/2016    Past Surgical History:  Procedure Laterality Date  . ABDOMINAL HYSTERECTOMY    . debribalator  2016  . INSERT / REPLACE / REMOVE PACEMAKER    . INSERTION OF ICD    . TONSILECTOMY, ADENOIDECTOMY, BILATERAL MYRINGOTOMY AND TUBES    . TONSILLECTOMY    . TONSILLECTOMY    . TUBAL LIGATION  1980  . TUBAL LIGATION      Prior to Admission medications   Medication Sig Start Date End Date Taking? Authorizing Provider  atorvastatin (LIPITOR) 40 MG tablet TAKE 1 TABLET BY MOUTH EVERY DAY--NEEDS APPT BEFORE RUN OUT 04/25/18   Park Liter P, DO  blood glucose meter kit and supplies KIT Dispense based on patient and insurance preference. Use up to four times daily as directed. (FOR ICD-9 250.00, 250.01). 07/24/17   Park Liter P, DO  carvedilol (COREG) 6.25 MG tablet Take 1 tablet (6.25 mg total) by mouth 2 (two) times daily with a meal. 05/09/18   Max Sane, MD  dicyclomine (BENTYL) 20 MG tablet Take 1 tablet (20 mg total) by mouth 3 (three) times daily as needed for spasms. 05/08/18   Johnson, Megan P, DO  famotidine (PEPCID) 40 MG tablet Take 1 tablet (40 mg total) by mouth every evening. 04/25/18 04/25/19  Park Liter P, DO  Insulin Glargine (LANTUS SOLOSTAR) 100 UNIT/ML Solostar Pen Inject 20 Units into  the skin daily at 10 pm. 04/27/18   Bettey Costa, MD  Insulin Pen Needle 32G X 6 MM MISC 1 each by Does not apply route daily. 04/07/18   Johnson, Megan P, DO  lamoTRIgine (LAMICTAL) 25 MG tablet Take 1 tablet (25 mg total) by mouth 2 (two) times daily. 03/25/18   [name], MD  metoCLOPramide (REGLAN) 10 MG tablet Take 1 tablet (10 mg total) by mouth 3 (three) times daily with meals. 05/01/18 05/01/19  Park Liter P, DO  neomycin-polymyxin-hydrocortisone (CORTISPORIN) OTIC solution Place 3 drops into the right ear 4 (four) times daily. Patient not taking: Reported on 05/08/2018 09/12/17   Park Liter P, DO  nitroGLYCERIN (NITROSTAT) 0.4 MG SL  tablet Place 1 tablet (0.4 mg total) under the tongue every 5 (five) minutes as needed for chest pain. 05/19/18 05/19/19  [name], MD  ondansetron (ZOFRAN ODT) 4 MG disintegrating tablet Take 1 tablet (4 mg total) by mouth every 8 (eight) hours as needed for nausea or vomiting. Patient not taking: Reported on 05/08/2018 02/14/18   Nance Pear, MD  pantoprazole (PROTONIX) 40 MG tablet Take 1 tablet (40 mg total) by mouth daily. 08/28/17 08/28/18  [name], MD  PARoxetine (PAXIL) 30 MG tablet TAKE 1 TABLET BY MOUTH EVERY DAY 03/14/18   Volney American, PA-C  QUEtiapine (SEROQUEL) 300 MG tablet TAKE 1 TABLET BY MOUTH EVERYDAY AT BEDTIME 03/14/18   Volney American, PA-C  sucralfate (CARAFATE) 1 g tablet Take 1 tablet (1 g total) by mouth 4 (four) times daily. 05/01/18   Park Liter P, DO     Allergies Levothyroxine  Family History  Problem Relation Age of Onset  . Hypertension Mother   . Hyperlipidemia Mother   . Heart disease Mother   . Hypertension Sister   . Asthma Sister   . Heart disease Sister   . Diabetes Sister   . Cancer Sister   . Alzheimer's disease Maternal Grandfather   . Hyperlipidemia Brother   . Asthma Sister   . Hypertension Sister   . Diabetes Sister     Social History Social History   Tobacco Use  . Smoking status: Former Smoker    Types: Cigarettes  . Smokeless tobacco: Never Used  . Tobacco comment: quit about 20 years ago   Substance Use Topics  . Alcohol use: Not Currently  . Drug use: Yes    Frequency: 1.0 times per week    Types: Marijuana    Review of Systems  Constitutional: No fever/chills Eyes: No visual changes.  ENT: No sore throat. Cardiovascular: As above Respiratory: Denies shortness of breath. Gastrointestinal: No abdominal pain.  No nausea, no vomiting.   Genitourinary: Negative for dysuria. Musculoskeletal: Negative for back pain. Skin: Negative for rash. Neurological: Negative for headaches or  weakness   ____________________________________________   PHYSICAL EXAM:  VITAL SIGNS: ED Triage Vitals  Enc Vitals Group     BP 05/19/18 1143 121/80     Pulse Rate 05/19/18 1143 97     Resp 05/19/18 1143 16     Temp 05/19/18 1143 98.4 F (36.9 C)     Temp Source 05/19/18 1143 Oral     SpO2 05/19/18 1143 95 %     Weight 05/19/18 1141 90.9 kg (200 lb 6.4 oz)     Height 05/19/18 1141 1.626 m (5' 4" )     Head Circumference --      Peak Flow --      Pain Score 05/19/18 1141  9     Pain Loc --      Pain Edu? --      Excl. in [place]? --     Constitutional: Alert and oriented. No acute distress. Pleasant and interactive Eyes: Conjunctivae are normal.   Nose: No congestion/rhinnorhea. Mouth/Throat: Mucous membranes are moist.    Cardiovascular: Normal rate, regular rhythm. Grossly normal heart sounds.  Good peripheral circulation. Respiratory: Normal respiratory effort.  No retractions.  Gastrointestinal: Soft and nontender. No distention.  Musculoskeletal:.  Warm and well perfused Neurologic:  Normal speech and language. No gross focal neurologic deficits are appreciated.  Skin:  Skin is warm, dry and intact. No rash noted. Psychiatric: Mood and affect are normal. Speech and behavior are normal.  ____________________________________________   LABS (all labs ordered are listed, but only abnormal results are displayed)  Labs Reviewed  BASIC METABOLIC PANEL - Abnormal; Notable for the following components:      Result Value   Glucose, Bld 152 (*)    Creatinine, Ser 1.05 (*)    GFR calc non Af Amer 59 (*)    All other components within normal limits  CBC - Abnormal; Notable for the following components:   RBC 3.36 (*)    Hemoglobin 10.4 (*)    HCT 31.0 (*)    All other components within normal limits  TROPONIN I - Abnormal; Notable for the following components:   Troponin I 0.03 (*)    All other components within normal limits    ____________________________________________  EKG  ED ECG REPORT I, [name], the attending physician, personally viewed and interpreted this ECG.  Date: 05/19/2018  Rhythm: normal sinus rhythm QRS Axis: normal Intervals: normal ST/T Wave abnormalities: Nonspecific ST changes Narrative Interpretation: no evidence of acute ischemia  ____________________________________________  RADIOLOGY  Chest x-ray normal ____________________________________________   PROCEDURES  Procedure(s) performed: No  Procedures   Critical Care performed: No ____________________________________________   INITIAL IMPRESSION / ASSESSMENT AND PLAN / ED COURSE  Pertinent labs & imaging results that were available during my care of the patient were reviewed by me and considered in my medical decision making (see chart for details).  She overall well-appearing in no acute distress.  After nitroglycerin she reports complete improvement in her discomfort and feels much better.  Also given labetalol for blood pressure control, she had not taken her morning BP meds today.  Troponin is 0.03 which is decreased from 0.071-week ago.  No active chest pain.  Does not appear consistent with ACS especially given recent normal stress test.  Has cardiology follow-up.  She agrees to return if any further chest pain.    ____________________________________________   FINAL CLINICAL IMPRESSION(S) / ED DIAGNOSES  Final diagnoses:  Atypical chest pain        Note:  This document was prepared using Dragon voice recognition software and may include unintentional dictation errors.    [name], MD 05/19/18 1539

## 2018-05-19 NOTE — ED Notes (Signed)
Pt given sandwich tray; ok per Dr. Cyril Loosen

## 2018-05-19 NOTE — ED Triage Notes (Signed)
Says chest pressure since 9pm and shortness of breath.

## 2018-05-19 NOTE — ED Notes (Signed)
Pt discharged home after verbalizing understanding of discharge instructions; nad noted. 

## 2018-05-19 NOTE — ED Notes (Signed)
Pt presents with chest pain x 2 days. She states it feels like pressure, felt like burning yesterday. She reports that she has tingling in her left arm, as well as arm pain. Pt states she was recently discharged and told to take nitroglycerin prn but she did not have a prescription when she left. Pt alert & oriented with NAD noted,

## 2018-05-20 ENCOUNTER — Telehealth: Payer: Self-pay | Admitting: Cardiology

## 2018-05-20 NOTE — Telephone Encounter (Signed)
Attempted to confirm remote transmission with pt. No answer and was unable to leave a message.   

## 2018-05-22 ENCOUNTER — Encounter: Payer: Self-pay | Admitting: Endocrinology

## 2018-05-22 ENCOUNTER — Inpatient Hospital Stay: Payer: Medicare Other | Admitting: Family Medicine

## 2018-05-25 ENCOUNTER — Emergency Department: Payer: Medicare Other

## 2018-05-25 ENCOUNTER — Other Ambulatory Visit: Payer: Self-pay

## 2018-05-25 ENCOUNTER — Inpatient Hospital Stay
Admission: EM | Admit: 2018-05-25 | Discharge: 2018-06-01 | DRG: 433 | Disposition: A | Discharge status: Home / Self Care | Payer: Medicare Other | Attending: Specialist | Admitting: Specialist

## 2018-05-25 DIAGNOSIS — Z820 Family history of epilepsy and other diseases of the nervous system: Secondary | ICD-10-CM

## 2018-05-25 DIAGNOSIS — R1010 Upper abdominal pain, unspecified: Secondary | ICD-10-CM

## 2018-05-25 DIAGNOSIS — I5022 Chronic systolic (congestive) heart failure: Secondary | ICD-10-CM | POA: Diagnosis present

## 2018-05-25 DIAGNOSIS — Z8249 Family history of ischemic heart disease and other diseases of the circulatory system: Secondary | ICD-10-CM

## 2018-05-25 DIAGNOSIS — Z794 Long term (current) use of insulin: Secondary | ICD-10-CM

## 2018-05-25 DIAGNOSIS — I509 Heart failure, unspecified: Secondary | ICD-10-CM | POA: Diagnosis not present

## 2018-05-25 DIAGNOSIS — F319 Bipolar disorder, unspecified: Secondary | ICD-10-CM | POA: Diagnosis present

## 2018-05-25 DIAGNOSIS — R1011 Right upper quadrant pain: Secondary | ICD-10-CM | POA: Diagnosis not present

## 2018-05-25 DIAGNOSIS — R7989 Other specified abnormal findings of blood chemistry: Secondary | ICD-10-CM | POA: Diagnosis present

## 2018-05-25 DIAGNOSIS — I428 Other cardiomyopathies: Secondary | ICD-10-CM | POA: Diagnosis not present

## 2018-05-25 DIAGNOSIS — J811 Chronic pulmonary edema: Secondary | ICD-10-CM

## 2018-05-25 DIAGNOSIS — K810 Acute cholecystitis: Secondary | ICD-10-CM | POA: Diagnosis present

## 2018-05-25 DIAGNOSIS — E1122 Type 2 diabetes mellitus with diabetic chronic kidney disease: Secondary | ICD-10-CM | POA: Diagnosis present

## 2018-05-25 DIAGNOSIS — K589 Irritable bowel syndrome without diarrhea: Secondary | ICD-10-CM | POA: Diagnosis present

## 2018-05-25 DIAGNOSIS — Z95810 Presence of automatic (implantable) cardiac defibrillator: Secondary | ICD-10-CM

## 2018-05-25 DIAGNOSIS — Z8349 Family history of other endocrine, nutritional and metabolic diseases: Secondary | ICD-10-CM

## 2018-05-25 DIAGNOSIS — I130 Hypertensive heart and chronic kidney disease with heart failure and stage 1 through stage 4 chronic kidney disease, or unspecified chronic kidney disease: Secondary | ICD-10-CM | POA: Diagnosis not present

## 2018-05-25 DIAGNOSIS — R748 Abnormal levels of other serum enzymes: Secondary | ICD-10-CM

## 2018-05-25 DIAGNOSIS — K219 Gastro-esophageal reflux disease without esophagitis: Secondary | ICD-10-CM | POA: Diagnosis present

## 2018-05-25 DIAGNOSIS — F4310 Post-traumatic stress disorder, unspecified: Secondary | ICD-10-CM | POA: Diagnosis present

## 2018-05-25 DIAGNOSIS — K7030 Alcoholic cirrhosis of liver without ascites: Principal | ICD-10-CM | POA: Diagnosis present

## 2018-05-25 DIAGNOSIS — K819 Cholecystitis, unspecified: Secondary | ICD-10-CM | POA: Diagnosis not present

## 2018-05-25 DIAGNOSIS — K828 Other specified diseases of gallbladder: Secondary | ICD-10-CM | POA: Diagnosis not present

## 2018-05-25 DIAGNOSIS — Z825 Family history of asthma and other chronic lower respiratory diseases: Secondary | ICD-10-CM

## 2018-05-25 DIAGNOSIS — N183 Chronic kidney disease, stage 3 (moderate): Secondary | ICD-10-CM | POA: Diagnosis present

## 2018-05-25 DIAGNOSIS — I5082 Biventricular heart failure: Secondary | ICD-10-CM | POA: Diagnosis present

## 2018-05-25 DIAGNOSIS — Z833 Family history of diabetes mellitus: Secondary | ICD-10-CM

## 2018-05-25 LAB — COMPREHENSIVE METABOLIC PANEL
ALBUMIN: 4.1 g/dL (ref 3.5–5.0)
ALT: 20 U/L (ref 0–44)
ANION GAP: 7 (ref 5–15)
AST: 15 U/L (ref 15–41)
Alkaline Phosphatase: 116 U/L (ref 38–126)
BILIRUBIN TOTAL: 2.1 mg/dL — AB (ref 0.3–1.2)
BUN: 19 mg/dL (ref 6–20)
CO2: 25 mmol/L (ref 22–32)
Calcium: 9.1 mg/dL (ref 8.9–10.3)
Chloride: 107 mmol/L (ref 98–111)
Creatinine, Ser: 1.16 mg/dL — ABNORMAL HIGH (ref 0.44–1.00)
GFR calc non Af Amer: 52 mL/min — ABNORMAL LOW (ref >60)
GLUCOSE: 208 mg/dL — AB (ref 70–99)
POTASSIUM: 3.3 mmol/L — AB (ref 3.5–5.1)
Sodium: 139 mmol/L (ref 135–145)
TOTAL PROTEIN: 8.1 g/dL (ref 6.5–8.1)

## 2018-05-25 LAB — CBC
HEMATOCRIT: 30.6 % — AB (ref 36.0–46.0)
Hemoglobin: 9.6 g/dL — ABNORMAL LOW (ref 12.0–15.0)
MCH: 30.4 pg (ref 26.0–34.0)
MCHC: 31.4 g/dL (ref 30.0–36.0)
MCV: 96.8 fL (ref 80.0–100.0)
PLATELETS: 243 10*3/uL (ref 150–400)
RBC: 3.16 MIL/uL — ABNORMAL LOW (ref 3.87–5.11)
RDW: 15.5 % (ref 11.5–15.5)
WBC: 8.5 10*3/uL (ref 4.0–10.5)
nRBC: 0.4 % — ABNORMAL HIGH (ref 0.0–0.2)

## 2018-05-25 LAB — TROPONIN I: Troponin I: 0.05 ng/mL (ref <0.03)

## 2018-05-25 LAB — LIPASE, BLOOD: LIPASE: 30 U/L (ref 11–51)

## 2018-05-25 MED ORDER — MORPHINE SULFATE (PF) 4 MG/ML IV SOLN
4.0000 mg | Freq: Once | INTRAVENOUS | Status: AC
  Administered 2018-05-25: 4 mg via INTRAVENOUS
  Filled 2018-05-25: qty 1

## 2018-05-25 MED ORDER — ONDANSETRON HCL 4 MG/2ML IJ SOLN
4.0000 mg | Freq: Once | INTRAMUSCULAR | Status: AC
  Administered 2018-05-25: 4 mg via INTRAVENOUS
  Filled 2018-05-25: qty 2

## 2018-05-25 NOTE — ED Notes (Signed)
CRITICAL LAB: TROPONIN is 0.05, [facility], Dr. Cyril Loosen notified, orders received

## 2018-05-25 NOTE — ED Triage Notes (Signed)
States feels like throat is "clogging" up since 2 pm.

## 2018-05-25 NOTE — ED Notes (Signed)
Patient transported to Ultrasound 

## 2018-05-25 NOTE — ED Notes (Addendum)
Pt c/o pain (worse with palpation) to right upper quad,pain worse with cough or deep inhalation, reports hx of diverticulitis, tubal ligation, partial hysterectomy, denies recent N/V/D reports meal of pizza to but usually low fat diet  Takes meds for hyper and hypotension, and DM

## 2018-05-25 NOTE — ED Notes (Signed)
ED Provider at bedside.  POC updated

## 2018-05-25 NOTE — ED Provider Notes (Signed)
[facility] Emergency Department Provider Note   ____________________________________________    I have reviewed the triage vital signs and the nursing notes.   HISTORY  Chief Complaint Shortness of Breath     HPI Bonnie Ochoa is a 52 y.o. female presents with complaints of upper abdominal pain, primarily, she reports this started earlier today and she describes sharp pain without radiation in her right upper quadrant.  Positive nausea, no vomiting.  Also complains that she had "a panic attack ".  At that time she felt like her throat was closing up, she reports this feels better now.  Denies difficulty breathing "except sometimes ".  No pleurisy.  No fevers or chills.  No history of abdominal surgery.   Past Medical History:  Diagnosis Date  . ADHD   . Arthritis   . Asthma   . Bipolar 1 disorder ([place])   . CHF (congestive heart failure) ([place])   . Cirrhosis of liver ([place])   . Coronary artery disease   . Depression   . Diabetes mellitus without complication ([place])   . Diverticulitis   . Hypertension   . IBS (irritable bowel syndrome)   . Insomnia   . Migraines   . PTSD (post-traumatic stress disorder)   . Restless leg syndrome   . Sleep apnea   . Vertigo     Patient Active Problem List   Diagnosis Date Noted  . Chest pain 05/08/2018  . Acute renal failure (ARF) ([place]) 04/25/2018  . Hypothyroid 04/30/2017  . HTN (hypertension) 03/11/2017  . Multiple thyroid nodules 01/29/2017  . Chronic systolic heart failure ([place]) 10/08/2016  . Obstructive sleep apnea 10/08/2016  . Noncompliance 09/18/2016  . Cirrhosis ([place]) 09/18/2016  . Pulmonary hypertension ([place]) 05/30/2016  . Hypokalemia 05/30/2016  . NICM (nonischemic cardiomyopathy) ([place]) 05/30/2016  . Other ascites   . Benign hypertensive renal disease   . DM (diabetes mellitus), type 2 with renal complications ([place])   . Bipolar 1 disorder ([place])   . Asthma   . Generalized abdominal pain  05/07/2016    Past Surgical History:  Procedure Laterality Date  . ABDOMINAL HYSTERECTOMY    . debribalator  2016  . INSERT / REPLACE / REMOVE PACEMAKER    . INSERTION OF ICD    . TONSILECTOMY, ADENOIDECTOMY, BILATERAL MYRINGOTOMY AND TUBES    . TONSILLECTOMY    . TONSILLECTOMY    . TUBAL LIGATION  1980  . TUBAL LIGATION      Prior to Admission medications   Medication Sig Start Date End Date Taking? Authorizing Provider  atorvastatin (LIPITOR) 40 MG tablet TAKE 1 TABLET BY MOUTH EVERY DAY--NEEDS APPT BEFORE RUN OUT 04/25/18   Park Liter P, DO  blood glucose meter kit and supplies KIT Dispense based on patient and insurance preference. Use up to four times daily as directed. (FOR ICD-9 250.00, 250.01). 07/24/17   Park Liter P, DO  carvedilol (COREG) 6.25 MG tablet Take 1 tablet (6.25 mg total) by mouth 2 (two) times daily with a meal. 05/09/18   Max Sane, MD  dicyclomine (BENTYL) 20 MG tablet Take 1 tablet (20 mg total) by mouth 3 (three) times daily as needed for spasms. 05/08/18   Johnson, Megan P, DO  famotidine (PEPCID) 40 MG tablet Take 1 tablet (40 mg total) by mouth every evening. 04/25/18 04/25/19  Park Liter P, DO  Insulin Glargine (LANTUS SOLOSTAR) 100 UNIT/ML Solostar Pen Inject 20 Units into the skin daily at 10 pm. 04/27/18  Bettey Costa, MD  Insulin Pen Needle 32G X 6 MM MISC 1 each by Does not apply route daily. 04/07/18   Johnson, Megan P, DO  lamoTRIgine (LAMICTAL) 25 MG tablet Take 1 tablet (25 mg total) by mouth 2 (two) times daily. 03/25/18   [name], MD  metoCLOPramide (REGLAN) 10 MG tablet Take 1 tablet (10 mg total) by mouth 3 (three) times daily with meals. 05/01/18 05/01/19  Park Liter P, DO  neomycin-polymyxin-hydrocortisone (CORTISPORIN) OTIC solution Place 3 drops into the right ear 4 (four) times daily. Patient not taking: Reported on 05/08/2018 09/12/17   Park Liter P, DO  nitroGLYCERIN (NITROSTAT) 0.4 MG SL tablet Place 1 tablet (0.4 mg  total) under the tongue every 5 (five) minutes as needed for chest pain. 05/19/18 05/19/19  [name], MD  ondansetron (ZOFRAN ODT) 4 MG disintegrating tablet Take 1 tablet (4 mg total) by mouth every 8 (eight) hours as needed for nausea or vomiting. Patient not taking: Reported on 05/08/2018 02/14/18   Nance Pear, MD  pantoprazole (PROTONIX) 40 MG tablet Take 1 tablet (40 mg total) by mouth daily. 08/28/17 08/28/18  [name], MD  PARoxetine (PAXIL) 30 MG tablet TAKE 1 TABLET BY MOUTH EVERY DAY 03/14/18   Volney American, PA-C  QUEtiapine (SEROQUEL) 300 MG tablet TAKE 1 TABLET BY MOUTH EVERYDAY AT BEDTIME 03/14/18   Volney American, PA-C  sucralfate (CARAFATE) 1 g tablet Take 1 tablet (1 g total) by mouth 4 (four) times daily. 05/01/18   Park Liter P, DO     Allergies Levothyroxine  Family History  Problem Relation Age of Onset  . Hypertension Mother   . Hyperlipidemia Mother   . Heart disease Mother   . Hypertension Sister   . Asthma Sister   . Heart disease Sister   . Diabetes Sister   . Cancer Sister   . Alzheimer's disease Maternal Grandfather   . Hyperlipidemia Brother   . Asthma Sister   . Hypertension Sister   . Diabetes Sister     Social History Social History   Tobacco Use  . Smoking status: Former Smoker    Types: Cigarettes  . Smokeless tobacco: Never Used  . Tobacco comment: quit about 20 years ago   Substance Use Topics  . Alcohol use: Not Currently  . Drug use: Yes    Frequency: 1.0 times per week    Types: Marijuana    Review of Systems  Constitutional: No fever/chills Eyes: No visual changes.  ENT: As above Cardiovascular: Denies chest pain. Respiratory: As above, no cough Gastrointestinal: As above Genitourinary: Negative for dysuria. Musculoskeletal: Negative for back pain. Skin: Negative for rash. Neurological: Negative for headaches   ____________________________________________   PHYSICAL EXAM:  VITAL SIGNS: ED  Triage Vitals [05/25/18 2039]  Enc Vitals Group     BP (!) 138/96     Pulse Rate 96     Resp 20     Temp 98.4 F (36.9 C)     Temp Source Oral     SpO2 94 %     Weight      Height      Head Circumference      Peak Flow      Pain Score      Pain Loc      Pain Edu?      Excl. in [place]?     Constitutional: Alert and oriented. No acute distress. Pleasant and interactive Eyes: Conjunctivae are normal.   Nose: No  congestion/rhinnorhea. Mouth/Throat: Mucous membranes are moist.  Neck normal, no swelling no stridor  Cardiovascular: Normal rate, regular rhythm. Grossly normal heart sounds.  Good peripheral circulation. Respiratory: Normal respiratory effort.  No retractions. Lungs CTAB. Gastrointestinal: Tenderness to palpation of the right upper quadrant, no distention  Musculoskeletal:  Warm and well perfused Neurologic:  Normal speech and language. No gross focal neurologic deficits are appreciated.  Skin:  Skin is warm, dry and intact. No rash noted. Psychiatric: Mood and affect are normal. Speech and behavior are normal.  ____________________________________________   LABS (all labs ordered are listed, but only abnormal results are displayed)  Labs Reviewed  CBC - Abnormal; Notable for the following components:      Result Value   RBC 3.16 (*)    Hemoglobin 9.6 (*)    HCT 30.6 (*)    nRBC 0.4 (*)    All other components within normal limits  COMPREHENSIVE METABOLIC PANEL - Abnormal; Notable for the following components:   Potassium 3.3 (*)    Glucose, Bld 208 (*)    Creatinine, Ser 1.16 (*)    Total Bilirubin 2.1 (*)    GFR calc non Af Amer 52 (*)    All other components within normal limits  TROPONIN I - Abnormal; Notable for the following components:   Troponin I 0.05 (*)    All other components within normal limits  CULTURE, BLOOD (ROUTINE X 2)  CULTURE, BLOOD (ROUTINE X 2)  LIPASE, BLOOD   ____________________________________________  EKG  ED ECG  REPORT I, [name], the attending physician, personally viewed and interpreted this ECG.  Date: 05/25/2018  Rhythm: normal sinus rhythm QRS Axis: normal Intervals: normal ST/T Wave abnormalities: Nonspecific changes Narrative Interpretation: no evidence of acute ischemia  ____________________________________________  RADIOLOGY  Chest x-ray unremarkable Ultrasound demonstrates pericholecystic edema, gallbladder wall thickening but no stones ____________________________________________   PROCEDURES  Procedure(s) performed: No  Procedures   Critical Care performed: No ____________________________________________   INITIAL IMPRESSION / ASSESSMENT AND PLAN / ED COURSE  Pertinent labs & imaging results that were available during my care of the patient were reviewed by me and considered in my medical decision making (see chart for details).  Patient presents with primarily right upper quadrant pain, she is significantly tender in this area, will treat with IV morphine, IV Zofran.  Obtain ultrasound, labs including lipase.  Obtain chest x-ray given intermittent shortness of breath which she attributes the panic attack/anxiety, and reevaluate  ----------------------------------------- 10:13 PM on 05/25/2018 -----------------------------------------  Discussed ultrasound results with Dr. Rosana Hoes of general surgery, requests admission to medicine given EF of 20%, surgical clearance, HIDA scan    ____________________________________________   FINAL CLINICAL IMPRESSION(S) / ED DIAGNOSES  Final diagnoses:  Upper abdominal pain  Acute acalculous cholecystitis        Note:  This document was prepared using Dragon voice recognition software and may include unintentional dictation errors.     [name], MD 05/25/18 2215

## 2018-05-26 ENCOUNTER — Ambulatory Visit: Payer: Self-pay | Admitting: Family Medicine

## 2018-05-26 ENCOUNTER — Inpatient Hospital Stay: Payer: Medicare Other

## 2018-05-26 ENCOUNTER — Encounter: Payer: Self-pay | Admitting: *Deleted

## 2018-05-26 DIAGNOSIS — N183 Chronic kidney disease, stage 3 (moderate): Secondary | ICD-10-CM | POA: Diagnosis present

## 2018-05-26 DIAGNOSIS — R748 Abnormal levels of other serum enzymes: Secondary | ICD-10-CM | POA: Diagnosis not present

## 2018-05-26 DIAGNOSIS — R7989 Other specified abnormal findings of blood chemistry: Secondary | ICD-10-CM | POA: Diagnosis present

## 2018-05-26 DIAGNOSIS — E1122 Type 2 diabetes mellitus with diabetic chronic kidney disease: Secondary | ICD-10-CM | POA: Diagnosis present

## 2018-05-26 DIAGNOSIS — R1011 Right upper quadrant pain: Secondary | ICD-10-CM

## 2018-05-26 DIAGNOSIS — R945 Abnormal results of liver function studies: Secondary | ICD-10-CM | POA: Diagnosis not present

## 2018-05-26 DIAGNOSIS — I428 Other cardiomyopathies: Secondary | ICD-10-CM | POA: Diagnosis present

## 2018-05-26 DIAGNOSIS — I5022 Chronic systolic (congestive) heart failure: Secondary | ICD-10-CM | POA: Diagnosis present

## 2018-05-26 DIAGNOSIS — I130 Hypertensive heart and chronic kidney disease with heart failure and stage 1 through stage 4 chronic kidney disease, or unspecified chronic kidney disease: Secondary | ICD-10-CM | POA: Diagnosis present

## 2018-05-26 DIAGNOSIS — Z8349 Family history of other endocrine, nutritional and metabolic diseases: Secondary | ICD-10-CM | POA: Diagnosis not present

## 2018-05-26 DIAGNOSIS — Z833 Family history of diabetes mellitus: Secondary | ICD-10-CM | POA: Diagnosis not present

## 2018-05-26 DIAGNOSIS — K589 Irritable bowel syndrome without diarrhea: Secondary | ICD-10-CM | POA: Diagnosis present

## 2018-05-26 DIAGNOSIS — K810 Acute cholecystitis: Secondary | ICD-10-CM | POA: Diagnosis not present

## 2018-05-26 DIAGNOSIS — Z820 Family history of epilepsy and other diseases of the nervous system: Secondary | ICD-10-CM | POA: Diagnosis not present

## 2018-05-26 DIAGNOSIS — Z95810 Presence of automatic (implantable) cardiac defibrillator: Secondary | ICD-10-CM | POA: Diagnosis not present

## 2018-05-26 DIAGNOSIS — I5082 Biventricular heart failure: Secondary | ICD-10-CM | POA: Diagnosis present

## 2018-05-26 DIAGNOSIS — K7030 Alcoholic cirrhosis of liver without ascites: Secondary | ICD-10-CM | POA: Diagnosis present

## 2018-05-26 DIAGNOSIS — Z8249 Family history of ischemic heart disease and other diseases of the circulatory system: Secondary | ICD-10-CM | POA: Diagnosis not present

## 2018-05-26 DIAGNOSIS — K7460 Unspecified cirrhosis of liver: Secondary | ICD-10-CM | POA: Diagnosis not present

## 2018-05-26 DIAGNOSIS — J9811 Atelectasis: Secondary | ICD-10-CM | POA: Diagnosis not present

## 2018-05-26 DIAGNOSIS — K219 Gastro-esophageal reflux disease without esophagitis: Secondary | ICD-10-CM | POA: Diagnosis present

## 2018-05-26 DIAGNOSIS — Z825 Family history of asthma and other chronic lower respiratory diseases: Secondary | ICD-10-CM | POA: Diagnosis not present

## 2018-05-26 DIAGNOSIS — F4310 Post-traumatic stress disorder, unspecified: Secondary | ICD-10-CM | POA: Diagnosis present

## 2018-05-26 DIAGNOSIS — Z794 Long term (current) use of insulin: Secondary | ICD-10-CM | POA: Diagnosis not present

## 2018-05-26 DIAGNOSIS — F319 Bipolar disorder, unspecified: Secondary | ICD-10-CM | POA: Diagnosis present

## 2018-05-26 DIAGNOSIS — I509 Heart failure, unspecified: Secondary | ICD-10-CM | POA: Diagnosis not present

## 2018-05-26 LAB — BASIC METABOLIC PANEL
Anion gap: 11 (ref 5–15)
BUN: 15 mg/dL (ref 6–20)
CO2: 23 mmol/L (ref 22–32)
Calcium: 8.5 mg/dL — ABNORMAL LOW (ref 8.9–10.3)
Chloride: 103 mmol/L (ref 98–111)
Creatinine, Ser: 0.9 mg/dL (ref 0.44–1.00)
GFR calc non Af Amer: >60 mL/min (ref >60)
GLUCOSE: 213 mg/dL — AB (ref 70–99)
Potassium: 3.2 mmol/L — ABNORMAL LOW (ref 3.5–5.1)
Sodium: 137 mmol/L (ref 135–145)

## 2018-05-26 LAB — GLUCOSE, CAPILLARY
GLUCOSE-CAPILLARY: 125 mg/dL — AB (ref 70–99)
GLUCOSE-CAPILLARY: 185 mg/dL — AB (ref 70–99)
Glucose-Capillary: 182 mg/dL — ABNORMAL HIGH (ref 70–99)
Glucose-Capillary: 153 mg/dL — ABNORMAL HIGH (ref 70–99)

## 2018-05-26 LAB — MAGNESIUM: Magnesium: 1.9 mg/dL (ref 1.7–2.4)

## 2018-05-26 LAB — CBC
HCT: 28.9 % — ABNORMAL LOW (ref 36.0–46.0)
HEMOGLOBIN: 9 g/dL — AB (ref 12.0–15.0)
MCH: 30.2 pg (ref 26.0–34.0)
MCHC: 31.1 g/dL (ref 30.0–36.0)
MCV: 97 fL (ref 80.0–100.0)
Platelets: 242 10*3/uL (ref 150–400)
RBC: 2.98 MIL/uL — ABNORMAL LOW (ref 3.87–5.11)
RDW: 15.5 % (ref 11.5–15.5)
WBC: 8.8 10*3/uL (ref 4.0–10.5)
nRBC: 0.3 % — ABNORMAL HIGH (ref 0.0–0.2)

## 2018-05-26 LAB — PHOSPHORUS: Phosphorus: 3.3 mg/dL (ref 2.5–4.6)

## 2018-05-26 LAB — TROPONIN I: Troponin I: 0.04 ng/mL (ref <0.03)

## 2018-05-26 MED ORDER — ACETAMINOPHEN 325 MG PO TABS: 650.0000 mg | ORAL_TABLET | Freq: Four times a day (QID) | ORAL | Status: DC | PRN

## 2018-05-26 MED ORDER — PIPERACILLIN-TAZOBACTAM 3.375 G IVPB
3.3750 g | Freq: Three times a day (TID) | INTRAVENOUS | Status: DC
  Administered 2018-05-26: 3.375 g via INTRAVENOUS
  Filled 2018-05-26 (×2): qty 50

## 2018-05-26 MED ORDER — HYDROMORPHONE HCL 1 MG/ML IJ SOLN
0.5000 mg | INTRAMUSCULAR | Status: DC | PRN
  Administered 2018-05-26: 0.5 mg via INTRAVENOUS
  Filled 2018-05-26: qty 0.5

## 2018-05-26 MED ORDER — HYDRALAZINE HCL 20 MG/ML IJ SOLN
10.0000 mg | Freq: Four times a day (QID) | INTRAMUSCULAR | Status: DC | PRN
  Administered 2018-05-26: 10 mg via INTRAVENOUS

## 2018-05-26 MED ORDER — TRAZODONE HCL 50 MG PO TABS
25.0000 mg | ORAL_TABLET | Freq: Every evening | ORAL | Status: DC | PRN
  Filled 2018-05-26: qty 1

## 2018-05-26 MED ORDER — INSULIN ASPART 100 UNIT/ML ~~LOC~~ SOLN: 0.0000 [IU] | Freq: Every day | SUBCUTANEOUS | Status: DC

## 2018-05-26 MED ORDER — TECHNETIUM TC 99M MEBROFENIN IV KIT
7.4540 | PACK | Freq: Once | INTRAVENOUS | Status: AC | PRN
  Administered 2018-05-26: 7.454 via INTRAVENOUS

## 2018-05-26 MED ORDER — TRAMADOL HCL 50 MG PO TABS
100.0000 mg | ORAL_TABLET | Freq: Three times a day (TID) | ORAL | Status: DC | PRN
  Administered 2018-05-26 – 2018-05-29 (×5): 100 mg via ORAL
  Filled 2018-05-26 (×5): qty 2

## 2018-05-26 MED ORDER — PANTOPRAZOLE SODIUM 40 MG PO TBEC
40.0000 mg | DELAYED_RELEASE_TABLET | Freq: Every day | ORAL | Status: DC
  Administered 2018-05-27 – 2018-06-01 (×6): 40 mg via ORAL
  Filled 2018-05-26 (×6): qty 1

## 2018-05-26 MED ORDER — TRAMADOL HCL 50 MG PO TABS
50.0000 mg | ORAL_TABLET | Freq: Three times a day (TID) | ORAL | Status: DC | PRN
  Administered 2018-05-29 – 2018-06-01 (×6): 50 mg via ORAL
  Filled 2018-05-26 (×7): qty 1

## 2018-05-26 MED ORDER — DOCUSATE SODIUM 100 MG PO CAPS
100.0000 mg | ORAL_CAPSULE | Freq: Two times a day (BID) | ORAL | Status: DC
  Administered 2018-05-26 – 2018-06-01 (×12): 100 mg via ORAL
  Filled 2018-05-26 (×13): qty 1

## 2018-05-26 MED ORDER — SODIUM CHLORIDE 0.9 % IV SOLN
250.0000 mL | INTRAVENOUS | Status: DC | PRN
  Administered 2018-05-26: 250 mL via INTRAVENOUS

## 2018-05-26 MED ORDER — OXYCODONE HCL 5 MG PO TABS: 5.0000 mg | ORAL_TABLET | ORAL | Status: DC | PRN

## 2018-05-26 MED ORDER — ONDANSETRON HCL 4 MG PO TABS: 4.0000 mg | ORAL_TABLET | Freq: Four times a day (QID) | ORAL | Status: DC | PRN

## 2018-05-26 MED ORDER — HYDROMORPHONE HCL 1 MG/ML IJ SOLN
INTRAMUSCULAR | Status: AC
  Administered 2018-05-26: 0.5 mg via INTRAVENOUS
  Filled 2018-05-26: qty 1

## 2018-05-26 MED ORDER — QUETIAPINE FUMARATE 300 MG PO TABS
300.0000 mg | ORAL_TABLET | Freq: Every day | ORAL | Status: DC
  Administered 2018-05-26 – 2018-05-29 (×4): 300 mg via ORAL
  Filled 2018-05-26 (×5): qty 1

## 2018-05-26 MED ORDER — HYDRALAZINE HCL 20 MG/ML IJ SOLN
INTRAMUSCULAR | Status: AC
  Administered 2018-05-26: 10 mg via INTRAVENOUS
  Filled 2018-05-26: qty 1

## 2018-05-26 MED ORDER — HYDROMORPHONE HCL 1 MG/ML IJ SOLN
0.5000 mg | INTRAMUSCULAR | Status: DC | PRN
  Administered 2018-05-26: 0.5 mg via INTRAVENOUS

## 2018-05-26 MED ORDER — SODIUM CHLORIDE 0.9% FLUSH
3.0000 mL | Freq: Two times a day (BID) | INTRAVENOUS | Status: DC
  Administered 2018-05-27 – 2018-05-31 (×10): 3 mL via INTRAVENOUS

## 2018-05-26 MED ORDER — BISACODYL 5 MG PO TBEC
5.0000 mg | DELAYED_RELEASE_TABLET | Freq: Every day | ORAL | Status: DC | PRN
  Administered 2018-05-28: 5 mg via ORAL
  Filled 2018-05-26: qty 1

## 2018-05-26 MED ORDER — SODIUM CHLORIDE 0.9% FLUSH
3.0000 mL | INTRAVENOUS | Status: DC | PRN
  Administered 2018-06-01: 3 mL via INTRAVENOUS
  Filled 2018-05-26: qty 3

## 2018-05-26 MED ORDER — INSULIN ASPART 100 UNIT/ML ~~LOC~~ SOLN
0.0000 [IU] | Freq: Three times a day (TID) | SUBCUTANEOUS | Status: DC
  Administered 2018-05-26: 3 [IU] via SUBCUTANEOUS
  Administered 2018-05-26: 2 [IU] via SUBCUTANEOUS
  Administered 2018-05-26: 3 [IU] via SUBCUTANEOUS
  Administered 2018-05-27 (×2): 2 [IU] via SUBCUTANEOUS
  Administered 2018-05-27: 5 [IU] via SUBCUTANEOUS
  Administered 2018-05-28: 3 [IU] via SUBCUTANEOUS
  Administered 2018-05-28: 2 [IU] via SUBCUTANEOUS
  Administered 2018-05-28 – 2018-05-29 (×2): 3 [IU] via SUBCUTANEOUS
  Administered 2018-05-29: 5 [IU] via SUBCUTANEOUS
  Administered 2018-05-30: 3 [IU] via SUBCUTANEOUS
  Administered 2018-05-30 – 2018-05-31 (×3): 2 [IU] via SUBCUTANEOUS
  Administered 2018-05-31: 5 [IU] via SUBCUTANEOUS
  Administered 2018-05-31: 3 [IU] via SUBCUTANEOUS
  Administered 2018-06-01: 5 [IU] via SUBCUTANEOUS
  Filled 2018-05-26 (×18): qty 1

## 2018-05-26 MED ORDER — CARVEDILOL 6.25 MG PO TABS
6.2500 mg | ORAL_TABLET | Freq: Two times a day (BID) | ORAL | Status: DC
  Administered 2018-05-26: 6.25 mg via ORAL
  Filled 2018-05-26: qty 1

## 2018-05-26 MED ORDER — ONDANSETRON HCL 4 MG/2ML IJ SOLN
4.0000 mg | Freq: Four times a day (QID) | INTRAMUSCULAR | Status: DC | PRN
  Administered 2018-05-26 – 2018-05-28 (×3): 4 mg via INTRAVENOUS
  Filled 2018-05-26 (×3): qty 2

## 2018-05-26 MED ORDER — SODIUM CHLORIDE 0.9 % IV SOLN
INTRAVENOUS | Status: DC | PRN
  Administered 2018-05-26 (×2): 250 mL via INTRAVENOUS

## 2018-05-26 MED ORDER — LAMOTRIGINE 25 MG PO TABS
25.0000 mg | ORAL_TABLET | Freq: Two times a day (BID) | ORAL | Status: DC
  Administered 2018-05-26 – 2018-06-01 (×12): 25 mg via ORAL
  Filled 2018-05-26 (×12): qty 1

## 2018-05-26 MED ORDER — ACETAMINOPHEN 650 MG RE SUPP: 650.0000 mg | Freq: Four times a day (QID) | RECTAL | Status: DC | PRN

## 2018-05-26 MED ORDER — SODIUM CHLORIDE 0.9 % IV SOLN
Freq: Once | INTRAVENOUS | Status: AC
  Administered 2018-05-26: 03:00:00 via INTRAVENOUS

## 2018-05-26 MED ORDER — PAROXETINE HCL 30 MG PO TABS
30.0000 mg | ORAL_TABLET | Freq: Every day | ORAL | Status: DC
  Administered 2018-05-26 – 2018-05-30 (×5): 30 mg via ORAL
  Filled 2018-05-26 (×5): qty 1

## 2018-05-26 MED ORDER — HYDROCODONE-ACETAMINOPHEN 5-325 MG PO TABS: 1.0000 | ORAL_TABLET | ORAL | Status: DC | PRN

## 2018-05-26 MED ORDER — HEPARIN SODIUM (PORCINE) 5000 UNIT/ML IJ SOLN
5000.0000 [IU] | Freq: Three times a day (TID) | INTRAMUSCULAR | Status: DC
  Administered 2018-05-26 – 2018-06-01 (×19): 5000 [IU] via SUBCUTANEOUS
  Filled 2018-05-26 (×19): qty 1

## 2018-05-26 MED ORDER — MORPHINE SULFATE (PF) 2 MG/ML IV SOLN
2.0000 mg | Freq: Four times a day (QID) | INTRAVENOUS | Status: DC | PRN
  Administered 2018-05-26 – 2018-05-28 (×4): 2 mg via INTRAVENOUS
  Filled 2018-05-26 (×4): qty 1

## 2018-05-26 MED ORDER — POTASSIUM CHLORIDE 10 MEQ/100ML IV SOLN
10.0000 meq | INTRAVENOUS | Status: AC
  Administered 2018-05-26 (×4): 10 meq via INTRAVENOUS
  Filled 2018-05-26 (×2): qty 100

## 2018-05-26 NOTE — Progress Notes (Signed)
Pharmacy Electrolyte Monitoring Consult:  Pharmacy consulted to assist in monitoring and replacing electrolytes in this 52 y.o. female admitted on 05/25/2018 with Shortness of Breath   Labs:  Sodium (mmol/L)  Date Value  05/25/2018 139  05/01/2018 142   Potassium (mmol/L)  Date Value  05/25/2018 3.3 (L)   Magnesium (mg/dL)  Date Value  11/91/4782 1.9   Calcium (mg/dL)  Date Value  95/62/1308 9.1   Albumin (g/dL)  Date Value  65/78/4696 4.1  04/07/2018 4.8    Assessment/Plan: Will replace K+ w/ KCI 10 mEq IV x 4 and will recheck w/ am labs. Will check mag and phos and assess replacement.  Thomasene Ripple, PharmD, BCPS Clinical Pharmacist 05/26/2018

## 2018-05-26 NOTE — ED Notes (Signed)
Report complete, transport called

## 2018-05-26 NOTE — Progress Notes (Signed)
No ICM remote transmission received for 05/20/2018 and next ICM transmission scheduled for 06/03/2018.

## 2018-05-26 NOTE — Progress Notes (Addendum)
Inpatient Diabetes Program Recommendations  AACE/ADA: New Consensus Statement on Inpatient Glycemic Control (2019)  Target Ranges:  Prepandial:   less than 140 mg/dL      Peak postprandial:   less than 180 mg/dL (1-2 hours)      Critically ill patients:  140 - 180 mg/dL   Results for ADDYLYNN, MUKES (MRN [id number]) as of 05/26/2018 10:21  Ref. Range 05/26/2018 07:30  Glucose-Capillary Latest Ref Range: 70 - 99 mg/dL 829 (H)  Results for STEPHANIEANN, GILLAND (MRN [id number]) as of 05/26/2018 10:21  Ref. Range 02/19/2018 00:00 05/08/2018 12:18  Hemoglobin A1C Latest Ref Range: 4.8 - 5.6 % 7.8 8.1 (H)   Review of Glycemic Control  Diabetes history: DM2 Outpatient Diabetes medications: Lantus 20 units QHS Current orders for Inpatient glycemic control: Novolog 0-15 units TID with meals, Novolog 0-5 units QHS  Inpatient Diabetes Program Recommendations: Insulin - Basal: If glucose remains consistently greater than 180 mg/dl, please consider ordering Lantus 10 units Q24H. HgbA1C: A1C 8.1% on 05/08/18 indicating an average glucose of 186 mg/dl.  Thanks, Orlando Penner, RN, MSN, CDE Diabetes Coordinator Inpatient Diabetes Program [phone] (Team Pager from 8am to 5pm)

## 2018-05-26 NOTE — Progress Notes (Signed)
[facility] - [place] at [facility]   PATIENT NAME: Bonnie Ochoa    MR#:  [id number]  DATE OF BIRTH:  1966-01-09  SUBJECTIVE:  CHIEF COMPLAINT:   Chief Complaint  Patient presents with  . Shortness of Breath  Patient feeling better, general surgery input appreciated, HIDA scan was negative  REVIEW OF SYSTEMS:  CONSTITUTIONAL: No fever, fatigue or weakness.  EYES: No blurred or double vision.  EARS, NOSE, AND THROAT: No tinnitus or ear pain.  RESPIRATORY: No cough, shortness of breath, wheezing or hemoptysis.  CARDIOVASCULAR: No chest pain, orthopnea, edema.  GASTROINTESTINAL: No nausea, vomiting, diarrhea or abdominal pain.  GENITOURINARY: No dysuria, hematuria.  ENDOCRINE: No polyuria, nocturia,  HEMATOLOGY: No anemia, easy bruising or bleeding SKIN: No rash or lesion. MUSCULOSKELETAL: No joint pain or arthritis.   NEUROLOGIC: No tingling, numbness, weakness.  PSYCHIATRY: No anxiety or depression.   ROS  DRUG ALLERGIES:   Allergies  Allergen Reactions  . Levothyroxine Rash    VITALS:  Blood pressure (!) 139/94, pulse (!) 102, temperature 98 F (36.7 C), temperature source Oral, resp. rate 19, height 5\' 4"  (1.626 m), weight 93.7 kg, SpO2 96 %.  PHYSICAL EXAMINATION:  GENERAL:  53 y.o.-year-old patient lying in the bed with no acute distress.  EYES: Pupils equal, round, reactive to light and accommodation. No scleral icterus. Extraocular muscles intact.  HEENT: Head atraumatic, normocephalic. Oropharynx and nasopharynx clear.  NECK:  Supple, no jugular venous distention. No thyroid enlargement, no tenderness.  LUNGS: Normal breath sounds bilaterally, no wheezing, rales,rhonchi or crepitation. No use of accessory muscles of respiration.  CARDIOVASCULAR: S1, S2 normal. No murmurs, rubs, or gallops.  ABDOMEN: Soft, nontender, nondistended. Bowel sounds present. No organomegaly or mass.  EXTREMITIES: No pedal edema, cyanosis, or clubbing.  NEUROLOGIC:  Cranial nerves II through XII are intact. Muscle strength 5/5 in all extremities. Sensation intact. Gait not checked.  PSYCHIATRIC: The patient is alert and oriented x 3.  SKIN: No obvious rash, lesion, or ulcer.   Physical Exam LABORATORY PANEL:   CBC Recent Labs  Lab 05/26/18 0717  WBC 8.8  HGB 9.0*  HCT 28.9*  PLT 242   ------------------------------------------------------------------------------------------------------------------  Chemistries  Recent Labs  Lab 05/25/18 2109 05/26/18 0717  NA 139 137  K 3.3* 3.2*  CL 107 103  CO2 25 23  GLUCOSE 208* 213*  BUN 19 15  CREATININE 1.16* 0.90  CALCIUM 9.1 8.5*  MG  --  1.9  AST 15  --   ALT 20  --   ALKPHOS 116  --   BILITOT 2.1*  --    ------------------------------------------------------------------------------------------------------------------  Cardiac Enzymes Recent Labs  Lab 05/25/18 2109 05/26/18 0717  TROPONINI 0.05* 0.04*   ------------------------------------------------------------------------------------------------------------------  RADIOLOGY:  Nm Hepatobiliary Liver Func  Result Date: 05/26/2018 CLINICAL DATA:  52 year old female with right upper quadrant pain. Subsequent encounter. EXAM: NUCLEAR MEDICINE HEPATOBILIARY IMAGING TECHNIQUE: Sequential images of the abdomen were obtained out to 60 minutes following intravenous administration of radiopharmaceutical. RADIOPHARMACEUTICALS:  7.454 mCi Tc-23m  Choletec IV COMPARISON:  05/25/2018 ultrasound. FINDINGS: Prompt uptake and biliary excretion of activity by the liver is seen. Gallbladder activity is visualized, consistent with patency of cystic duct. Biliary activity passes into small bowel, consistent with patent common bile duct. IMPRESSION: 1. Gallbladder activity is visualized, consistent with patency of cystic duct. 2. Biliary activity passes into small bowel, consistent with patent common bile duct. Electronically Signed   By: Lacy Duverney  M.D.   On: 05/26/2018 12:59  Dg Chest Port 1 View  Result Date: 05/25/2018 CLINICAL DATA:  Shortness of breath. Dyspnea. Asthma. Congestive heart failure. Cirrhosis. EXAM: PORTABLE CHEST 1 VIEW COMPARISON:  05/19/2018 FINDINGS: Stable mild cardiomegaly. Pacemaker remains in appropriate position. Both lungs are well aerated and clear. IMPRESSION: No active disease. Electronically Signed   By: Myles Rosenthal M.D.   On: 05/25/2018 21:19   US Abdomen Limited Ruq  Result Date: 05/25/2018 CLINICAL DATA:  Acute onset of upper abdominal pain today. EXAM: ULTRASOUND ABDOMEN LIMITED RIGHT UPPER QUADRANT COMPARISON:  Right upper quadrant ultrasound 02/14/2018. CT abdomen and pelvis 02/09/2018 FINDINGS: Gallbladder: No gallstones identified. Gallbladder wall thickening with pericholecystic edema. Gallbladder wall measures up to about 8 mm in thickness. Murphy's sign is negative although patient was given morphine before the scan limiting the sensitivity of this sign. Common bile duct: Diameter: 4.5 mm, normal Liver: No focal lesion identified. Within normal limits in parenchymal echogenicity. Portal vein is patent on color Doppler imaging with normal direction of blood flow towards the liver. IMPRESSION: Gallbladder wall thickening and edema. No stones. Changes may be due to acalculous cholecystitis, liver disease, or other inflammatory hepatobiliary process. Could consider radionuclide biliary scan for further evaluation. Electronically Signed   By: Burman Nieves M.D.   On: 05/25/2018 21:45    ASSESSMENT AND PLAN:  * Acute right upper quadrant pain Etiology unknown -though suspected due to liver cirrhosis Improved General surgery input appreciated, HIDA scan was negative, advance to clear liquid diet, ambulate/increase activity, discontinue IV fluids, taper off opioid meds  *Acute elevated troponins Inconsistent with acute coronary syndrome Cardiac enzymes negative for acute coronary syndrome Cardiology  consult if expert opinion  *HFrEF secondary to NICM status post ICD placement, EF 20% Continue current regiment  *CKD 3 Stable   *Chronic diabetes mellitus type 2  Controlled on current regiment   *Chronic alcoholic liver cirrhosis  Stable  Need to continue to follow-up with gastroenterology status post discharge   Discharge on tomorrow barring any complications  All the records are reviewed and case discussed with Care Management/Social Workerr. Management plans discussed with the patient, family and they are in agreement.  CODE STATUS: full  TOTAL TIME TAKING CARE OF THIS PATIENT: 40 minutes.     POSSIBLE D/C IN 1 DAYS, DEPENDING ON CLINICAL CONDITION.   Evelena Asa Amyiah Gaba M.D on 05/26/2018   Between 7am to 6pm - Pager - [phone]  After 6pm go to [email or web address] - password [email or web address]  [facility]  Office  [phone]  CC: Primary care physician; Dorcas Carrow, DO  Note: This dictation was prepared with Dragon dictation along with smaller phrase technology. Any transcriptional errors that result from this process are unintentional.

## 2018-05-26 NOTE — H&P (Signed)
[place] at [place] NAME: Bonnie Ochoa    MR#:  [id number]  DATE OF BIRTH:  Oct 19, 1965  DATE OF ADMISSION:  05/25/2018  PRIMARY CARE PHYSICIAN: Valerie Roys, DO   REQUESTING/REFERRING PHYSICIAN:   CHIEF COMPLAINT:   Chief Complaint  Patient presents with  . Shortness of Breath    HISTORY OF PRESENT ILLNESS: Bonnie Ochoa  is a 52 y.o. female with a known history of ADHD, asthma, polysubstance abuse, bipolar disorder, CHF, ejection fraction around 38%, alcoholic liver cirrhosis, hypertension sleep apnea and other comorbidities. Patient presented to emergency room for acute onset of severe right upper quadrant abdominal pain, associated with nausea and vomiting.  Patient denies having similar episodes in the past.  She did not eat anything unusual.  No new medications. Blood test done emergency room revealed glucose level at 208, creatinine 1.16, troponin 0 0.05, potassium 3.3.  WBCs within normal limits. Abdominal ultrasound shows gallbladder wall thickening and edema. No stones.  Patient is admitted for further evaluation and treatment.  PAST MEDICAL HISTORY:   Past Medical History:  Diagnosis Date  . ADHD   . Arthritis   . Asthma   . Bipolar 1 disorder ([place])   . CHF (congestive heart failure) ([place])   . Cirrhosis of liver ([place])   . Coronary artery disease   . Depression   . Diabetes mellitus without complication ([place])   . Diverticulitis   . Hypertension   . IBS (irritable bowel syndrome)   . Insomnia   . Migraines   . PTSD (post-traumatic stress disorder)   . Restless leg syndrome   . Sleep apnea   . Vertigo     PAST SURGICAL HISTORY:  Past Surgical History:  Procedure Laterality Date  . ABDOMINAL HYSTERECTOMY    . debribalator  2016  . INSERT / REPLACE / REMOVE PACEMAKER    . INSERTION OF ICD    . TONSILECTOMY, ADENOIDECTOMY, BILATERAL MYRINGOTOMY AND TUBES    . TONSILLECTOMY    . TONSILLECTOMY    .  TUBAL LIGATION  1980  . TUBAL LIGATION      SOCIAL HISTORY:  Social History   Tobacco Use  . Smoking status: Former Smoker    Types: Cigarettes  . Smokeless tobacco: Never Used  . Tobacco comment: quit about 20 years ago   Substance Use Topics  . Alcohol use: Not Currently    FAMILY HISTORY:  Family History  Problem Relation Age of Onset  . Hypertension Mother   . Hyperlipidemia Mother   . Heart disease Mother   . Hypertension Sister   . Asthma Sister   . Heart disease Sister   . Diabetes Sister   . Cancer Sister   . Alzheimer's disease Maternal Grandfather   . Hyperlipidemia Brother   . Asthma Sister   . Hypertension Sister   . Diabetes Sister     DRUG ALLERGIES:  Allergies  Allergen Reactions  . Levothyroxine Rash    REVIEW OF SYSTEMS:   CONSTITUTIONAL: No fever, fatigue or weakness.  EYES: No changes in vision.  EARS, NOSE, AND THROAT: No tinnitus or ear pain.  RESPIRATORY: No cough, shortness of breath, wheezing or hemoptysis.  CARDIOVASCULAR: No chest pain, orthopnea, edema.  GASTROINTESTINAL: Positive for nausea, vomiting, and right upper quadrant abdominal pain.  No diarrhea, no bleeding. GENITOURINARY: No dysuria, hematuria.  ENDOCRINE: No polyuria, nocturia. HEMATOLOGY: No bleeding. SKIN: No rash or lesion. MUSCULOSKELETAL: No joint pain at this  time.   NEUROLOGIC: No focal weakness.  PSYCHIATRY: No anxiety or depression.   MEDICATIONS AT HOME:  Prior to Admission medications   Medication Sig Start Date End Date Taking? Authorizing Provider  atorvastatin (LIPITOR) 40 MG tablet TAKE 1 TABLET BY MOUTH EVERY DAY--NEEDS APPT BEFORE RUN OUT 04/25/18  Yes Johnson, Megan P, DO  carvedilol (COREG) 6.25 MG tablet Take 1 tablet (6.25 mg total) by mouth 2 (two) times daily with a meal. 05/09/18  Yes Max Sane, MD  dicyclomine (BENTYL) 20 MG tablet Take 1 tablet (20 mg total) by mouth 3 (three) times daily as needed for spasms. 05/08/18  Yes Johnson, Megan P,  DO  famotidine (PEPCID) 40 MG tablet Take 1 tablet (40 mg total) by mouth every evening. 04/25/18 04/25/19 Yes Johnson, Megan P, DO  Insulin Glargine (LANTUS SOLOSTAR) 100 UNIT/ML Solostar Pen Inject 20 Units into the skin daily at 10 pm. 04/27/18  Yes Bettey Costa, MD  lamoTRIgine (LAMICTAL) 25 MG tablet Take 1 tablet (25 mg total) by mouth 2 (two) times daily. 03/25/18  Yes [name], MD  metoCLOPramide (REGLAN) 10 MG tablet Take 1 tablet (10 mg total) by mouth 3 (three) times daily with meals. 05/01/18 05/01/19 Yes Johnson, Megan P, DO  pantoprazole (PROTONIX) 40 MG tablet Take 1 tablet (40 mg total) by mouth daily. 08/28/17 08/28/18 Yes [name], MD  PARoxetine (PAXIL) 30 MG tablet TAKE 1 TABLET BY MOUTH EVERY DAY 03/14/18  Yes Volney American, PA-C  QUEtiapine (SEROQUEL) 300 MG tablet TAKE 1 TABLET BY MOUTH EVERYDAY AT BEDTIME 03/14/18  Yes Volney American, PA-C  sucralfate (CARAFATE) 1 g tablet Take 1 tablet (1 g total) by mouth 4 (four) times daily. 05/01/18  Yes Johnson, Megan P, DO  blood glucose meter kit and supplies KIT Dispense based on patient and insurance preference. Use up to four times daily as directed. (FOR ICD-9 250.00, 250.01). 07/24/17   Johnson, Megan P, DO  Insulin Pen Needle 32G X 6 MM MISC 1 each by Does not apply route daily. 04/07/18   Johnson, Megan P, DO  neomycin-polymyxin-hydrocortisone (CORTISPORIN) OTIC solution Place 3 drops into the right ear 4 (four) times daily. Patient not taking: Reported on 05/08/2018 09/12/17   Park Liter P, DO  nitroGLYCERIN (NITROSTAT) 0.4 MG SL tablet Place 1 tablet (0.4 mg total) under the tongue every 5 (five) minutes as needed for chest pain. 05/19/18 05/19/19  [name], MD  ondansetron (ZOFRAN ODT) 4 MG disintegrating tablet Take 1 tablet (4 mg total) by mouth every 8 (eight) hours as needed for nausea or vomiting. Patient not taking: Reported on 05/08/2018 02/14/18   Nance Pear, MD      PHYSICAL EXAMINATION:    VITAL SIGNS: Blood pressure (!) 142/101, pulse (!) 102, temperature 98.4 F (36.9 C), temperature source Oral, resp. rate 16, SpO2 94 %.  GENERAL:  52 y.o.-year-old patient lying in the bed with moderate distress.  EYES: Pupils equal, round, reactive to light and accommodation. No scleral icterus. Extraocular muscles intact.  HEENT: Head atraumatic, normocephalic. Oropharynx and nasopharynx clear.  NECK:  Supple, no jugular venous distention. No thyroid enlargement, no tenderness.  LUNGS: Reduced breath sounds bilaterally, no wheezing, rales,rhonchi or crepitation. No use of accessory muscles of respiration.  CARDIOVASCULAR: S1, S2 normal. No S3/S4.  ABDOMEN: Soft, nontender, nondistended. Bowel sounds present. No organomegaly or mass.  EXTREMITIES: No pedal edema, cyanosis, or clubbing.  NEUROLOGIC: Cranial nerves II through XII are intact. Muscle strength 5/5 in all  extremities. Sensation intact.   PSYCHIATRIC: The patient is alert and oriented x 3.  SKIN: No obvious rash, lesion, or ulcer.   LABORATORY PANEL:   CBC Recent Labs  Lab 05/19/18 1150 05/25/18 2109  WBC 5.2 8.5  HGB 10.4* 9.6*  HCT 31.0* 30.6*  PLT 220 243  MCV 92.4 96.8  MCH 31.1 30.4  MCHC 33.6 31.4  RDW 14.4 15.5   ------------------------------------------------------------------------------------------------------------------  Chemistries  Recent Labs  Lab 05/19/18 1150 05/25/18 2109  NA 140 139  K 3.5 3.3*  CL 108 107  CO2 22 25  GLUCOSE 152* 208*  BUN 15 19  CREATININE 1.05* 1.16*  CALCIUM 8.9 9.1  AST  --  15  ALT  --  20  ALKPHOS  --  116  BILITOT  --  2.1*   ------------------------------------------------------------------------------------------------------------------ estimated creatinine clearance is 60.6 mL/min (A) (by C-G formula based on SCr of 1.16 mg/dL (H)). ------------------------------------------------------------------------------------------------------------------ No  results for input(s): TSH, T4TOTAL, T3FREE, THYROIDAB in the last 72 hours.  Invalid input(s): FREET3   Coagulation profile No results for input(s): INR, PROTIME in the last 168 hours. ------------------------------------------------------------------------------------------------------------------- No results for input(s): DDIMER in the last 72 hours. -------------------------------------------------------------------------------------------------------------------  Cardiac Enzymes Recent Labs  Lab 05/19/18 1150 05/25/18 2109  TROPONINI 0.03* 0.05*   ------------------------------------------------------------------------------------------------------------------ Invalid input(s): POCBNP  ---------------------------------------------------------------------------------------------------------------  Urinalysis    Component Value Date/Time   COLORURINE STRAW (A) 02/09/2018 0458   APPEARANCEUR Hazy (A) 02/19/2018 1127   LABSPEC 1.014 02/09/2018 0458   PHURINE 6.0 02/09/2018 0458   GLUCOSEU Negative 02/19/2018 1127   HGBUR NEGATIVE 02/09/2018 0458   BILIRUBINUR Negative 02/19/2018 1127   KETONESUR NEGATIVE 02/09/2018 0458   PROTEINUR 2+ (A) 02/19/2018 1127   PROTEINUR NEGATIVE 02/09/2018 0458   NITRITE Negative 02/19/2018 1127   NITRITE NEGATIVE 02/09/2018 0458   LEUKOCYTESUR Negative 02/19/2018 1127     RADIOLOGY: Dg Chest Port 1 View  Result Date: 05/25/2018 CLINICAL DATA:  Shortness of breath. Dyspnea. Asthma. Congestive heart failure. Cirrhosis. EXAM: PORTABLE CHEST 1 VIEW COMPARISON:  05/19/2018 FINDINGS: Stable mild cardiomegaly. Pacemaker remains in appropriate position. Both lungs are well aerated and clear. IMPRESSION: No active disease. Electronically Signed   By: Earle Gell M.D.   On: 05/25/2018 21:19   US Abdomen Limited Ruq  Result Date: 05/25/2018 CLINICAL DATA:  Acute onset of upper abdominal pain today. EXAM: ULTRASOUND ABDOMEN LIMITED RIGHT UPPER  QUADRANT COMPARISON:  Right upper quadrant ultrasound 02/14/2018. CT abdomen and pelvis 02/09/2018 FINDINGS: Gallbladder: No gallstones identified. Gallbladder wall thickening with pericholecystic edema. Gallbladder wall measures up to about 8 mm in thickness. Murphy's sign is negative although patient was given morphine before the scan limiting the sensitivity of this sign. Common bile duct: Diameter: 4.5 mm, normal Liver: No focal lesion identified. Within normal limits in parenchymal echogenicity. Portal vein is patent on color Doppler imaging with normal direction of blood flow towards the liver. IMPRESSION: Gallbladder wall thickening and edema. No stones. Changes may be due to acalculous cholecystitis, liver disease, or other inflammatory hepatobiliary process. Could consider radionuclide biliary scan for further evaluation. Electronically Signed   By: Lucienne Capers M.D.   On: 05/25/2018 21:45    EKG: Orders placed or performed in visit on 05/25/18  . EKG 12-Lead    IMPRESSION AND PLAN:  1.  Acute right upper quadrant pain, likely secondary to acute cholecystitis.  We will keep patient n.p.o.  Will further evaluate with HIDA scan. We will start antibiotics IV, Zosyn.  General surgery  team is consulted for further evaluation and treatment. Cardiology is consulted for preop clearance. 2.  Elevated troponin level at 0.05, which seems to be at baseline for this patient with cardiomyopathy.  Continue to monitor patient on telemetry and follow troponin levels, to rule out ACS. 3. HFrEF secondary to NICM, status post ICD placement.  Ejection fraction is around 20%.  Patient is currently clinically compensated.  Will avoid IV fluids.  Continue medical treatment. 4.  CKD 3.  Creatinine is 1.16, at baseline.  Continue to monitor kidney function closely.  Avoid nephrotoxic medications. 5.  Diabetes type 2.  Will monitor blood sugars before meals and at bedtime and use insulin treatment during the  hospital stay. 6.  Alcoholic liver cirrhosis, stable, continue management per gastroenterology.  All the records are reviewed and case discussed with ED provider. Management plans discussed with the patient, family and they are in agreement.  CODE STATUS: Full Code Status History    Date Active Date Inactive Code Status Order ID Comments User Context   05/08/2018 1627 05/09/2018 1834 Full Code [id number]  Hillary Bow, MD ED   04/25/2018 2135 04/27/2018 1510 Full Code [id number]  Dustin Flock, MD Inpatient   07/03/2017 0100 07/05/2017 1806 Full Code [id number]  Harrie Foreman, MD Inpatient   09/17/2016 1712 09/18/2016 2025 Full Code [id number]  Fritzi Mandes, MD Inpatient   09/10/2016 1105 09/11/2016 2045 Full Code [id number]  Dustin Flock, MD Inpatient   05/30/2016 1129 06/04/2016 2226 Full Code [id number]  Fritzi Mandes, MD Inpatient       TOTAL TIME TAKING CARE OF THIS PATIENT: 55 minutes.    Amelia Jo M.D on 05/26/2018 at 2:02 AM  Between 7am to 6pm - Pager - [phone]  After 6pm go to [email or web address] - password [email or web address] [facility] [place] [facility]  [phone]  CC: Primary care physician; Valerie Roys, DO

## 2018-05-26 NOTE — Progress Notes (Signed)
Pharmacy Antibiotic Note  Bonnie Ochoa is a 52 y.o. female admitted on 05/25/2018 with intra-abdominal infx.  Pharmacy has been consulted for zosyn dosing.  Plan: Zosyn 3.375g IV q8h (4 hour infusion).  Height: 5\' 4"  (162.6 cm) Weight: 206 lb 9.1 oz (93.7 kg) IBW/kg (Calculated) : 54.7  Temp (24hrs), Avg:98.4 F (36.9 C), Min:98.3 F (36.8 C), Max:98.4 F (36.9 C)  Recent Labs  Lab 05/19/18 1150 05/25/18 2109  WBC 5.2 8.5  CREATININE 1.05* 1.16*    Estimated Creatinine Clearance: 61.5 mL/min (A) (by C-G formula based on SCr of 1.16 mg/dL (H)).    Allergies  Allergen Reactions  . Levothyroxine Rash    Thank you for allowing pharmacy to be a part of this patient's care.  Thomasene Ripple, PharmD, BCPS Clinical Pharmacist 05/26/2018

## 2018-05-26 NOTE — Consult Note (Deleted)
SURGICAL CONSULTATION NOTE (initial) - cpt: 35573   HISTORY OF PRESENT ILLNESS (HPI):  52 y.o. female presented to [facility] ED on 05/25/09 for evaluation of right upper quadrant abdominal pain. She notes a 1 day history of worsening RUQ abdominal pain which she described as sharp in nature. She has associated nausea with the pain but denied any emesis. She endorses a hisdtory of simlar pain in the past most recently about 2 weeks ago. Typically the pain last for hours to days but subsides on its own. No association with food. She denied any fevers, chills, acute CP, worsening SOB, diarrhea, constipation, or urinary changes. Work up in the ED was concerning for acalculous cholecystitis.   Surgery is consulted by hospitalist physician Dr. Amelia Jo, MD in this context for evaluation and management of right upper quadrant pain and possible cholecystitis.   PAST MEDICAL HISTORY (PMH):  Past Medical History:  Diagnosis Date  . ADHD   . Arthritis   . Asthma   . Bipolar 1 disorder ([place])   . CHF (congestive heart failure) ([place])   . Cirrhosis of liver ([place])   . Coronary artery disease   . Depression   . Diabetes mellitus without complication ([place])   . Diverticulitis   . Hypertension   . IBS (irritable bowel syndrome)   . Insomnia   . Migraines   . PTSD (post-traumatic stress disorder)   . Restless leg syndrome   . Sleep apnea   . Vertigo      PAST SURGICAL HISTORY ([place]):  Past Surgical History:  Procedure Laterality Date  . ABDOMINAL HYSTERECTOMY    . debribalator  2016  . INSERT / REPLACE / REMOVE PACEMAKER    . INSERTION OF ICD    . TONSILECTOMY, ADENOIDECTOMY, BILATERAL MYRINGOTOMY AND TUBES    . TONSILLECTOMY    . TONSILLECTOMY    . TUBAL LIGATION  1980  . TUBAL LIGATION       MEDICATIONS:  Prior to Admission medications   Medication Sig Start Date End Date Taking? Authorizing Provider  atorvastatin (LIPITOR) 40 MG tablet TAKE 1 TABLET BY MOUTH EVERY DAY--NEEDS APPT BEFORE  RUN OUT 04/25/18  Yes Johnson, Megan P, DO  carvedilol (COREG) 6.25 MG tablet Take 1 tablet (6.25 mg total) by mouth 2 (two) times daily with a meal. 05/09/18  Yes Max Sane, MD  dicyclomine (BENTYL) 20 MG tablet Take 1 tablet (20 mg total) by mouth 3 (three) times daily as needed for spasms. 05/08/18  Yes Johnson, Megan P, DO  famotidine (PEPCID) 40 MG tablet Take 1 tablet (40 mg total) by mouth every evening. 04/25/18 04/25/19 Yes Johnson, Megan P, DO  Insulin Glargine (LANTUS SOLOSTAR) 100 UNIT/ML Solostar Pen Inject 20 Units into the skin daily at 10 pm. 04/27/18  Yes Bettey Costa, MD  lamoTRIgine (LAMICTAL) 25 MG tablet Take 1 tablet (25 mg total) by mouth 2 (two) times daily. 03/25/18  Yes [name], MD  metoCLOPramide (REGLAN) 10 MG tablet Take 1 tablet (10 mg total) by mouth 3 (three) times daily with meals. 05/01/18 05/01/19 Yes Johnson, Megan P, DO  pantoprazole (PROTONIX) 40 MG tablet Take 1 tablet (40 mg total) by mouth daily. 08/28/17 08/28/18 Yes [name], MD  PARoxetine (PAXIL) 30 MG tablet TAKE 1 TABLET BY MOUTH EVERY DAY 03/14/18  Yes Volney American, PA-C  QUEtiapine (SEROQUEL) 300 MG tablet TAKE 1 TABLET BY MOUTH EVERYDAY AT BEDTIME 03/14/18  Yes Volney American, PA-C  sucralfate (CARAFATE) 1 g tablet  Take 1 tablet (1 g total) by mouth 4 (four) times daily. 05/01/18  Yes Johnson, Megan P, DO  blood glucose meter kit and supplies KIT Dispense based on patient and insurance preference. Use up to four times daily as directed. (FOR ICD-9 250.00, 250.01). 07/24/17   Johnson, Megan P, DO  Insulin Pen Needle 32G X 6 MM MISC 1 each by Does not apply route daily. 04/07/18   Johnson, Megan P, DO  neomycin-polymyxin-hydrocortisone (CORTISPORIN) OTIC solution Place 3 drops into the right ear 4 (four) times daily. Patient not taking: Reported on 05/08/2018 09/12/17   Park Liter P, DO  nitroGLYCERIN (NITROSTAT) 0.4 MG SL tablet Place 1 tablet (0.4 mg total) under the tongue every 5  (five) minutes as needed for chest pain. 05/19/18 05/19/19  [name], MD  ondansetron (ZOFRAN ODT) 4 MG disintegrating tablet Take 1 tablet (4 mg total) by mouth every 8 (eight) hours as needed for nausea or vomiting. Patient not taking: Reported on 05/08/2018 02/14/18   Nance Pear, MD     ALLERGIES:  Allergies  Allergen Reactions  . Levothyroxine Rash     SOCIAL HISTORY:  Social History   Socioeconomic History  . Marital status: Married    Spouse name: Not on file  . Number of children: 2  . Years of education: Not on file  . Highest education level: High school graduate  Occupational History    Comment: disabled  Social Needs  . Financial resource strain: Not hard at all  . Food insecurity:    Worry: Sometimes true    Inability: Sometimes true  . Transportation needs:    Medical: Yes    Non-medical: Yes  Tobacco Use  . Smoking status: Former Smoker    Types: Cigarettes  . Smokeless tobacco: Never Used  . Tobacco comment: quit about 20 years ago   Substance and Sexual Activity  . Alcohol use: Not Currently  . Drug use: Yes    Frequency: 1.0 times per week    Types: Marijuana  . Sexual activity: Yes    Birth control/protection: Surgical    Comment: one partner  Lifestyle  . Physical activity:    Days per week: 0 days    Minutes per session: 0 min  . Stress: Very much  Relationships  . Social connections:    Talks on phone: Never    Gets together: Twice a week    Attends religious service: Never    Active member of club or organization: No    Attends meetings of clubs or organizations: Never    Relationship status: Married  . Intimate partner violence:    Fear of current or ex partner: No    Emotionally abused: No    Physically abused: No    Forced sexual activity: No  Other Topics Concern  . Not on file  Social History Narrative   Volunteers occasionally     The patient currently resides (home / rehab facility / nursing home): Home The patient  normally is (ambulatory / bedbound): Ambulatory   FAMILY HISTORY:  Family History  Problem Relation Age of Onset  . Hypertension Mother   . Hyperlipidemia Mother   . Heart disease Mother   . Hypertension Sister   . Asthma Sister   . Heart disease Sister   . Diabetes Sister   . Cancer Sister   . Alzheimer's disease Maternal Grandfather   . Hyperlipidemia Brother   . Asthma Sister   . Hypertension Sister   .  Diabetes Sister      REVIEW OF SYSTEMS:  Constitutional: denies weight loss, fever, chills, or sweats  Eyes: denies any other vision changes, history of eye injury  ENT: denies sore throat, hearing problems  Respiratory: denies shortness of breath, wheezing  Cardiovascular: denies chest pain, palpitations  Gastrointestinal: + abdominal pain, + Nausea, denied vomiting, or diarrhea/and bowel function as per HPI Genitourinary: denies burning with urination or urinary frequency Musculoskeletal: denies any other joint pains or cramps  Skin: denies any other rashes or skin discolorations  Neurological: denies any other headache, dizziness, weakness  Psychiatric: denies any other depression, anxiety   All other review of systems were negative   VITAL SIGNS:  Temp:  [98.3 F (36.8 C)-98.4 F (36.9 C)] 98.3 F (36.8 C) (10/14 0255) Pulse Rate:  [95-104] 104 (10/14 0255) Resp:  [16-20] 18 (10/14 0255) BP: (129-150)/(72-105) 129/92 (10/14 0255) SpO2:  [92 %-97 %] 97 % (10/14 0255) Weight:  [93.7 kg] 93.7 kg (10/14 0218)     Height: _0  (162.6 cm) Weight: 93.7 kg BMI (Calculated): 35.44   INTAKE/OUTPUT:  This shift: Total I/O In: -  Out: 325 [Urine:325]  Last 2 shifts: _1 @   PHYSICAL EXAM:  Constitutional:  -- Obese body habitus  -- Awake, alert, and oriented x3, no apparent distress Eyes:  -- Pupils equally round and reactive to light  -- No scleral icterus, B/L no occular discharge Ear, nose, throat: -- Neck is FROM WNL Pulmonary:  -- No wheezes or  rhales -- Equal breath sounds bilaterally -- Breathing non-labored at rest Cardiovascular:  -- S1, S2 present  -- No pericardial rubs  Gastrointestinal:  -- Abdomen soft, RUQ tenderness, non-distended, no guarding or rebound tenderness -- No abdominal masses appreciated, pulsatile or otherwise  Musculoskeletal and Integumentary:  -- Extremities: B/L UE and LE FROM, hands and feet warm, no edema  Neurologic:  -- Motor function: Intact and symmetric -- Sensation: Intact and symmetric Psychiatric:  -- Mood and affect WNL     Labs:  CBC Latest Ref Rng & Units 05/26/2018 05/25/2018 05/19/2018  WBC 4.0 - 10.5 K/uL 8.8 8.5 5.2  Hemoglobin 12.0 - 15.0 g/dL 9.0(L) 9.6(L) 10.4(L)  Hematocrit 36.0 - 46.0 % 28.9(L) 30.6(L) 31.0(L)  Platelets 150 - 400 K/uL 242 243 220   CMP Latest Ref Rng & Units 05/26/2018 05/25/2018 05/19/2018  Glucose 70 - 99 mg/dL 213(H) 208(H) 152(H)  BUN 6 - 20 mg/dL _2 Creatinine 0.44 - 1.00 mg/dL 0.90 1.16(H) 1.05(H)  Sodium 135 - 145 mmol/L 137 139 140  Potassium 3.5 - 5.1 mmol/L 3.2(L) 3.3(L) 3.5  Chloride 98 - 111 mmol/L 103 107 108  CO2 22 - 32 mmol/L _3 Calcium 8.9 - 10.3 mg/dL 8.5(L) 9.1 8.9  Total Protein 6.5 - 8.1 g/dL - 8.1 -  Total Bilirubin 0.3 - 1.2 mg/dL - 2.1(H) -  Alkaline Phos 38 - 126 U/L - 116 -  AST 15 - 41 U/L - 15 -  ALT 0 - 44 U/L - 20 -     Imaging studies:   -- [place] Limited RUQ on 10/13:  IMPRESSION: Gallbladder wall thickening and edema. No stones. Changes may be due to acalculous cholecystitis, liver disease, or other inflammatory hepatobiliary process. Could consider radionuclide biliary scan for further evaluation.   Assessment/Plan: (ICD-10's: K17.9) 52 y.o. female with possible acalculous cholecystitis and very mild hyperbilirubinemia, complicated by a multitude of pertinent comorbidities including asthma, Cirrhosis, CAD, DM, HTN, Sleep Apnea,  CHF with an EF of 20%, obesity, and former tobacco abuse  (smoking)   - NPO, IVF   - IV Abx  - Further surgery recommendations pending HIDA results later today. Consider percutaneous cholecystostomy placement rather than cholecystectomy given patient is a poor surgical candidate  - Cardiology consult for possible surgical clearance, appreciate their help  - Trend total bilirubin (CMP) tomorrow  - Mobilize, DVT prophylaxis  - Medicine primary  All of the above findings and recommendations were discussed with the patient and her family, and all of patient's and her family's questions were answered to their expressed satisfaction.  Thank you for the opportunity to participate in this patient's care.   -- Edison Simon, PA-C [facility] 05/26/2018, 9:51 AM [phone] M-F: 7am - 4pm

## 2018-05-26 NOTE — Consult Note (Addendum)
SURGICAL CONSULTATION NOTE (initial) - cpt: 00712  Patient seen and examined as described below with surgical PA-C, Ardell Isaacs.  Assessment/Plan: (ICD-10's: R10.11, E80.6) 52 y.o. female with right upper quadrant pain and mild hyperbilirubinemia without evidence of cholecystitis on HIDA, complicated by comorbidities significant for morbid obesity (BMI >35), DM, HTN, CAD with CHF (LVEF 20%), asthma, alcoholic cirrhosis, asthma, OSA, and former chronic tobacco abuse (smoking)   - ultrasound and HIDA results discussed with patient  - currently no emergent indication for cholecystectomy or cholecystostomy drain placement  - medical management and disposition as per primary medical team, consider GI consult prn  - will signoff, please call if any questions or concerns  I have personally reviewed the patient's chart, evaluated/examined the patient, proposed the recommended management, and discussed these recommendations with the patient and her family to their expressed satisfaction as well as with patient's RN.  Thank you for the opportunity to participate in this patient's care.  -- Marilynne Drivers Rosana Hoes, MD, [place]: [facility] - Partnering for exceptional care. Office: [phone]    SURGICAL CONSULTATION NOTE (initial) - cpt: 99254  HISTORY OF PRESENT ILLNESS (HPI):  52 y.o. female presented to [facility] ED on 05/25/09 for evaluation of right upper quadrant abdominal pain. She notes a 1 day history of worsening RUQ abdominal pain which she described as sharp in nature. She has associated nausea with the pain but denied any emesis. She endorses a hisdtory of simlar pain in the past most recently about 2 weeks ago. Typically the pain last for hours to days but subsides on its own. No association with food. She denied any fevers, chills, acute CP, worsening SOB, diarrhea, constipation, or urinary changes. Work up in the ED was concerning for acalculous  cholecystitis.   Surgery is consulted by hospitalist physician Dr. Amelia Jo, MD in this context for evaluation and management of right upper quadrant pain and possible cholecystitis.   PAST MEDICAL HISTORY (PMH):  Past Medical History:  Diagnosis Date  . ADHD   . Arthritis   . Asthma   . Bipolar 1 disorder ([place])   . CHF (congestive heart failure) ([place])   . Cirrhosis of liver ([place])   . Coronary artery disease   . Depression   . Diabetes mellitus without complication ([place])   . Diverticulitis   . Hypertension   . IBS (irritable bowel syndrome)   . Insomnia   . Migraines   . PTSD (post-traumatic stress disorder)   . Restless leg syndrome   . Sleep apnea   . Vertigo      PAST SURGICAL HISTORY ([place]):  Past Surgical History:  Procedure Laterality Date  . ABDOMINAL HYSTERECTOMY    . debribalator  2016  . INSERT / REPLACE / REMOVE PACEMAKER    . INSERTION OF ICD    . TONSILECTOMY, ADENOIDECTOMY, BILATERAL MYRINGOTOMY AND TUBES    . TONSILLECTOMY    . TONSILLECTOMY    . TUBAL LIGATION  1980  . TUBAL LIGATION       MEDICATIONS:  Prior to Admission medications   Medication Sig Start Date End Date Taking? Authorizing Provider  atorvastatin (LIPITOR) 40 MG tablet TAKE 1 TABLET BY MOUTH EVERY DAY--NEEDS APPT BEFORE RUN OUT 04/25/18  Yes Johnson, Megan P, DO  carvedilol (COREG) 6.25 MG tablet Take 1 tablet (6.25 mg total) by mouth 2 (two) times daily with a meal. 05/09/18  Yes Max Sane, MD  dicyclomine (BENTYL) 20 MG tablet Take 1 tablet (  20 mg total) by mouth 3 (three) times daily as needed for spasms. 05/08/18  Yes Johnson, Megan P, DO  famotidine (PEPCID) 40 MG tablet Take 1 tablet (40 mg total) by mouth every evening. 04/25/18 04/25/19 Yes Johnson, Megan P, DO  Insulin Glargine (LANTUS SOLOSTAR) 100 UNIT/ML Solostar Pen Inject 20 Units into the skin daily at 10 pm. 04/27/18  Yes Bettey Costa, MD  lamoTRIgine (LAMICTAL) 25 MG tablet Take 1 tablet (25 mg total) by mouth 2 (two)  times daily. 03/25/18  Yes [name], MD  metoCLOPramide (REGLAN) 10 MG tablet Take 1 tablet (10 mg total) by mouth 3 (three) times daily with meals. 05/01/18 05/01/19 Yes Johnson, Megan P, DO  pantoprazole (PROTONIX) 40 MG tablet Take 1 tablet (40 mg total) by mouth daily. 08/28/17 08/28/18 Yes [name], MD  PARoxetine (PAXIL) 30 MG tablet TAKE 1 TABLET BY MOUTH EVERY DAY 03/14/18  Yes Volney American, PA-C  QUEtiapine (SEROQUEL) 300 MG tablet TAKE 1 TABLET BY MOUTH EVERYDAY AT BEDTIME 03/14/18  Yes Volney American, PA-C  sucralfate (CARAFATE) 1 g tablet Take 1 tablet (1 g total) by mouth 4 (four) times daily. 05/01/18  Yes Johnson, Megan P, DO  blood glucose meter kit and supplies KIT Dispense based on patient and insurance preference. Use up to four times daily as directed. (FOR ICD-9 250.00, 250.01). 07/24/17   Johnson, Megan P, DO  Insulin Pen Needle 32G X 6 MM MISC 1 each by Does not apply route daily. 04/07/18   Johnson, Megan P, DO  neomycin-polymyxin-hydrocortisone (CORTISPORIN) OTIC solution Place 3 drops into the right ear 4 (four) times daily. Patient not taking: Reported on 05/08/2018 09/12/17   Park Liter P, DO  nitroGLYCERIN (NITROSTAT) 0.4 MG SL tablet Place 1 tablet (0.4 mg total) under the tongue every 5 (five) minutes as needed for chest pain. 05/19/18 05/19/19  [name], MD  ondansetron (ZOFRAN ODT) 4 MG disintegrating tablet Take 1 tablet (4 mg total) by mouth every 8 (eight) hours as needed for nausea or vomiting. Patient not taking: Reported on 05/08/2018 02/14/18   Nance Pear, MD     ALLERGIES:  Allergies  Allergen Reactions  . Levothyroxine Rash     SOCIAL HISTORY:  Social History   Socioeconomic History  . Marital status: Married    Spouse name: Not on file  . Number of children: 2  . Years of education: Not on file  . Highest education level: High school graduate  Occupational History    Comment: disabled  Social Needs  . Financial  resource strain: Not hard at all  . Food insecurity:    Worry: Sometimes true    Inability: Sometimes true  . Transportation needs:    Medical: Yes    Non-medical: Yes  Tobacco Use  . Smoking status: Former Smoker    Types: Cigarettes  . Smokeless tobacco: Never Used  . Tobacco comment: quit about 20 years ago   Substance and Sexual Activity  . Alcohol use: Not Currently  . Drug use: Yes    Frequency: 1.0 times per week    Types: Marijuana  . Sexual activity: Yes    Birth control/protection: Surgical    Comment: one partner  Lifestyle  . Physical activity:    Days per week: 0 days    Minutes per session: 0 min  . Stress: Very much  Relationships  . Social connections:    Talks on phone: Never    Gets together: Twice a week  Attends religious service: Never    Active member of club or organization: No    Attends meetings of clubs or organizations: Never    Relationship status: Married  . Intimate partner violence:    Fear of current or ex partner: No    Emotionally abused: No    Physically abused: No    Forced sexual activity: No  Other Topics Concern  . Not on file  Social History Narrative   Volunteers occasionally     The patient currently resides (home / rehab facility / nursing home): Home The patient normally is (ambulatory / bedbound): Ambulatory   FAMILY HISTORY:  Family History  Problem Relation Age of Onset  . Hypertension Mother   . Hyperlipidemia Mother   . Heart disease Mother   . Hypertension Sister   . Asthma Sister   . Heart disease Sister   . Diabetes Sister   . Cancer Sister   . Alzheimer's disease Maternal Grandfather   . Hyperlipidemia Brother   . Asthma Sister   . Hypertension Sister   . Diabetes Sister      REVIEW OF SYSTEMS:  Constitutional: denies weight loss, fever, chills, or sweats  Eyes: denies any other vision changes, history of eye injury  ENT: denies sore throat, hearing problems  Respiratory: denies shortness of  breath, wheezing  Cardiovascular: denies chest pain, palpitations  Gastrointestinal: + abdominal pain, + Nausea, denied vomiting, or diarrhea/and bowel function as per HPI Genitourinary: denies burning with urination or urinary frequency Musculoskeletal: denies any other joint pains or cramps  Skin: denies any other rashes or skin discolorations  Neurological: denies any other headache, dizziness, weakness  Psychiatric: denies any other depression, anxiety   All other review of systems were negative   VITAL SIGNS:  Temp:  [98.3 F (36.8 C)-98.4 F (36.9 C)] 98.3 F (36.8 C) (10/14 0255) Pulse Rate:  [95-104] 104 (10/14 0255) Resp:  [16-20] 18 (10/14 0255) BP: (129-150)/(72-105) 129/92 (10/14 0255) SpO2:  [92 %-97 %] 97 % (10/14 0255) Weight:  [93.7 kg] 93.7 kg (10/14 0218)     Height: 5' 4"  (162.6 cm) Weight: 93.7 kg BMI (Calculated): 35.44   INTAKE/OUTPUT:  This shift: Total I/O In: -  Out: 325 [Urine:325]  Last 2 shifts: @IOLAST2SHIFTS @   PHYSICAL EXAM:  Constitutional:  -- Obese body habitus  -- Awake, alert, and oriented x3, no apparent distress Eyes:  -- Pupils equally round and reactive to light  -- No scleral icterus, B/L no occular discharge Ear, nose, throat: -- Neck is FROM WNL Pulmonary:  -- No wheezes or rhales -- Equal breath sounds bilaterally -- Breathing non-labored at rest Cardiovascular:  -- S1, S2 present  -- No pericardial rubs  Gastrointestinal:  -- Abdomen soft, RUQ tenderness, non-distended, no guarding or rebound tenderness -- No abdominal masses appreciated, pulsatile or otherwise  Musculoskeletal and Integumentary:  -- Extremities: B/L UE and LE FROM, hands and feet warm, no edema  Neurologic:  -- Motor function: Intact and symmetric -- Sensation: Intact and symmetric Psychiatric:  -- Mood and affect WNL  Labs:  CBC Latest Ref Rng & Units 05/26/2018 05/25/2018 05/19/2018  WBC 4.0 - 10.5 K/uL 8.8 8.5 5.2  Hemoglobin 12.0 - 15.0 g/dL  9.0(L) 9.6(L) 10.4(L)  Hematocrit 36.0 - 46.0 % 28.9(L) 30.6(L) 31.0(L)  Platelets 150 - 400 K/uL 242 243 220   CMP Latest Ref Rng & Units 05/26/2018 05/25/2018 05/19/2018  Glucose 70 - 99 mg/dL 213(H) 208(H) 152(H)  BUN 6 - 20  mg/dL 15 19 15   Creatinine 0.44 - 1.00 mg/dL 0.90 1.16(H) 1.05(H)  Sodium 135 - 145 mmol/L 137 139 140  Potassium 3.5 - 5.1 mmol/L 3.2(L) 3.3(L) 3.5  Chloride 98 - 111 mmol/L 103 107 108  CO2 22 - 32 mmol/L 23 25 22   Calcium 8.9 - 10.3 mg/dL 8.5(L) 9.1 8.9  Total Protein 6.5 - 8.1 g/dL - 8.1 -  Total Bilirubin 0.3 - 1.2 mg/dL - 2.1(H) -  Alkaline Phos 38 - 126 U/L - 116 -  AST 15 - 41 U/L - 15 -  ALT 0 - 44 U/L - 20 -   Imaging studies:   -- [place] Limited RUQ on 10/13:  IMPRESSION: Gallbladder wall thickening and edema. No stones. Changes may be due to acalculous cholecystitis, liver disease, or other inflammatory hepatobiliary process. Could consider radionuclide biliary scan for further evaluation.  Assessment/Plan: (ICD-10's: R10.11) 52 y.o. female with right upper quadrant pain and mild hyperbilirubinemia without evidence of cholecystitis on HIDA, complicated by comorbidities significant for morbid obesity (BMI >35), DM, HTN, CAD with CHF (LVEF 20%), asthma, alcoholic cirrhosis, asthma, OSA, and former chronic tobacco abuse (smoking)   - HIDA scan reviewed with Dr. Rosana Hoes, shows patent cystic and common bile duct, no cholecystitis   - No indications for surgical intervention at this time. Patient would need surgical clearance in the future as she is a poor surgical candidate.   - Medical management form Hospitalist, cardiology following  - general surgery will sign off, please re-consult as need arises.   All of the above findings and recommendations were discussed with the patient and her family, and all of patient's and her family's questions were answered to their expressed satisfaction.  Thank you for the opportunity to participate in this patient's  care.   -- Edison Simon, PA-C [facility] 05/26/2018, 9:51 AM [phone] M-F: 7am - 4pm

## 2018-05-26 NOTE — ED Notes (Signed)
Floor called for pt on the way

## 2018-05-27 LAB — COMPREHENSIVE METABOLIC PANEL
ALBUMIN: 3.6 g/dL (ref 3.5–5.0)
ALT: 44 U/L (ref 0–44)
ANION GAP: 8 (ref 5–15)
AST: 37 U/L (ref 15–41)
Alkaline Phosphatase: 101 U/L (ref 38–126)
BUN: 12 mg/dL (ref 6–20)
CHLORIDE: 108 mmol/L (ref 98–111)
CO2: 24 mmol/L (ref 22–32)
Calcium: 8.6 mg/dL — ABNORMAL LOW (ref 8.9–10.3)
Creatinine, Ser: 1.03 mg/dL — ABNORMAL HIGH (ref 0.44–1.00)
GFR calc Af Amer: >60 mL/min (ref >60)
GFR calc non Af Amer: >60 mL/min (ref >60)
Glucose, Bld: 131 mg/dL — ABNORMAL HIGH (ref 70–99)
POTASSIUM: 3.3 mmol/L — AB (ref 3.5–5.1)
Sodium: 140 mmol/L (ref 135–145)
Total Bilirubin: 2.2 mg/dL — ABNORMAL HIGH (ref 0.3–1.2)
Total Protein: 7 g/dL (ref 6.5–8.1)

## 2018-05-27 LAB — CBC WITH DIFFERENTIAL/PLATELET
ABS IMMATURE GRANULOCYTES: 0.03 10*3/uL (ref 0.00–0.07)
BASOS ABS: 0 10*3/uL (ref 0.0–0.1)
BASOS PCT: 1 %
Eosinophils Absolute: 0.4 10*3/uL (ref 0.0–0.5)
Eosinophils Relative: 5 %
HCT: 27.2 % — ABNORMAL LOW (ref 36.0–46.0)
Hemoglobin: 8.4 g/dL — ABNORMAL LOW (ref 12.0–15.0)
IMMATURE GRANULOCYTES: 0 %
LYMPHS PCT: 34 %
Lymphs Abs: 2.3 10*3/uL (ref 0.7–4.0)
MCH: 30.3 pg (ref 26.0–34.0)
MCHC: 30.9 g/dL (ref 30.0–36.0)
MCV: 98.2 fL (ref 80.0–100.0)
MONO ABS: 0.5 10*3/uL (ref 0.1–1.0)
Monocytes Relative: 7 %
NEUTROS ABS: 3.5 10*3/uL (ref 1.7–7.7)
NEUTROS PCT: 53 %
Platelets: 209 10*3/uL (ref 150–400)
RBC: 2.77 MIL/uL — ABNORMAL LOW (ref 3.87–5.11)
RDW: 15.6 % — ABNORMAL HIGH (ref 11.5–15.5)
WBC: 6.8 10*3/uL (ref 4.0–10.5)
nRBC: 0.3 % — ABNORMAL HIGH (ref 0.0–0.2)

## 2018-05-27 LAB — GLUCOSE, CAPILLARY
GLUCOSE-CAPILLARY: 129 mg/dL — AB (ref 70–99)
Glucose-Capillary: 214 mg/dL — ABNORMAL HIGH (ref 70–99)
Glucose-Capillary: 249 mg/dL — ABNORMAL HIGH (ref 70–99)
Glucose-Capillary: 180 mg/dL — ABNORMAL HIGH (ref 70–99)
Glucose-Capillary: 184 mg/dL — ABNORMAL HIGH (ref 70–99)

## 2018-05-27 LAB — MAGNESIUM: MAGNESIUM: 1.8 mg/dL (ref 1.7–2.4)

## 2018-05-27 LAB — PHOSPHORUS: PHOSPHORUS: 3 mg/dL (ref 2.5–4.6)

## 2018-05-27 MED ORDER — POTASSIUM CHLORIDE 20 MEQ PO PACK
40.0000 meq | PACK | Freq: Once | ORAL | Status: AC
  Administered 2018-05-27: 40 meq via ORAL
  Filled 2018-05-27: qty 2

## 2018-05-27 MED ORDER — CARVEDILOL 12.5 MG PO TABS
12.5000 mg | ORAL_TABLET | Freq: Two times a day (BID) | ORAL | Status: DC
  Administered 2018-05-27 – 2018-06-01 (×10): 12.5 mg via ORAL
  Filled 2018-05-27 (×10): qty 1

## 2018-05-27 MED ORDER — POTASSIUM CHLORIDE 10 MEQ/100ML IV SOLN: 10.0000 meq | INTRAVENOUS | Status: DC

## 2018-05-27 NOTE — Progress Notes (Signed)
Pharmacy Electrolyte Monitoring Consult:  Pharmacy consulted to assist in monitoring and replacing electrolytes in this 52 y.o. female admitted on 05/25/2018 with Shortness of Breath   Labs:  Sodium (mmol/L)  Date Value  05/27/2018 140  05/01/2018 142   Potassium (mmol/L)  Date Value  05/27/2018 3.3 (L)   Magnesium (mg/dL)  Date Value  28/78/6767 1.8   Phosphorus (mg/dL)  Date Value  20/94/7096 3.0   Calcium (mg/dL)  Date Value  28/36/6294 8.6 (L)   Albumin (g/dL)  Date Value  76/54/6503 3.6  04/07/2018 4.8    Assessment/Plan: Will replace K+ w/ KCI 10 mEq IV x 2 and will recheck w/ am labs. Mag and phos wnl No replenishment needed at this time  Albina Billet, PharmD Clinical Pharmacist 05/27/2018 7:40 AM

## 2018-05-27 NOTE — Discharge Summary (Signed)
[facility] NAME: Bonnie Ochoa    MR#:  [id number]  DATE OF BIRTH:  Feb 19, 1966  DATE OF ADMISSION:  05/25/2018 ADMITTING PHYSICIAN: Amelia Jo, MD  DATE OF DISCHARGE: No discharge date for patient encounter.  PRIMARY CARE PHYSICIAN: Park Liter P, DO    ADMISSION DIAGNOSIS:  Acute acalculous cholecystitis [K81.0] Upper abdominal pain [R10.10]  DISCHARGE DIAGNOSIS:  Active Problems:   Acute cholecystitis   Abdominal pain, right upper quadrant   Hyperbilirubinemia   SECONDARY DIAGNOSIS:   Past Medical History:  Diagnosis Date  . ADHD   . Arthritis   . Asthma   . Bipolar 1 disorder ([place])   . CHF (congestive heart failure) ([place])   . Cirrhosis of liver ([place])   . Coronary artery disease   . Depression   . Diabetes mellitus without complication ([place])   . Diverticulitis   . Hypertension   . IBS (irritable bowel syndrome)   . Insomnia   . Migraines   . PTSD (post-traumatic stress disorder)   . Restless leg syndrome   . Sleep apnea   . Vertigo     HOSPITAL COURSE:  *Acute right upper quadrant pain Resolved Etiology unknown -though suspected due to liver disease General surgery input appreciated, HIDA scan was negative, tolerating a diet, ambulating without difficulty, and patient did well   *Acute elevated troponins Inconsistent with acute coronary syndrome Cardiac enzymes negative for acute coronary syndrome Cardiology consulted while in house  *HFrEF secondary to NICM status post ICD placement, EF 20% Stable on current regiment  *CKD 3 Stable   *Chronic diabetes mellitus type 2  Controlled on current regiment   *Chronic alcoholic liver cirrhosis  Stable  Need to continue to follow-up with gastroenterology status post discharge   DISCHARGE CONDITIONS:   stable  CONSULTS OBTAINED:  Treatment Team:  Vickie Epley, MD Yolonda Kida, MD  DRUG ALLERGIES:   Allergies   Allergen Reactions  . Levothyroxine Rash    DISCHARGE MEDICATIONS:   Allergies as of 05/27/2018      Reactions   Levothyroxine Rash      Medication List    STOP taking these medications   atorvastatin 40 MG tablet Commonly known as:  LIPITOR   carvedilol 6.25 MG tablet Commonly known as:  COREG     TAKE these medications   blood glucose meter kit and supplies Kit Dispense based on patient and insurance preference. Use up to four times daily as directed. (FOR ICD-9 250.00, 250.01).   dicyclomine 20 MG tablet Commonly known as:  BENTYL Take 1 tablet (20 mg total) by mouth 3 (three) times daily as needed for spasms.   famotidine 40 MG tablet Commonly known as:  PEPCID Take 1 tablet (40 mg total) by mouth every evening.   Insulin Glargine 100 UNIT/ML Solostar Pen Commonly known as:  LANTUS Inject 20 Units into the skin daily at 10 pm.   Insulin Pen Needle 32G X 6 MM Misc 1 each by Does not apply route daily.   lamoTRIgine 25 MG tablet Commonly known as:  LAMICTAL Take 1 tablet (25 mg total) by mouth 2 (two) times daily.   metoCLOPramide 10 MG tablet Commonly known as:  REGLAN Take 1 tablet (10 mg total) by mouth 3 (three) times daily with meals.   neomycin-polymyxin-hydrocortisone OTIC solution Commonly known as:  CORTISPORIN Place 3 drops into the right ear 4 (four) times daily.   nitroGLYCERIN 0.4 MG  SL tablet Commonly known as:  NITROSTAT Place 1 tablet (0.4 mg total) under the tongue every 5 (five) minutes as needed for chest pain.   ondansetron 4 MG disintegrating tablet Commonly known as:  ZOFRAN-ODT Take 1 tablet (4 mg total) by mouth every 8 (eight) hours as needed for nausea or vomiting.   pantoprazole 40 MG tablet Commonly known as:  PROTONIX Take 1 tablet (40 mg total) by mouth daily.   PARoxetine 30 MG tablet Commonly known as:  PAXIL TAKE 1 TABLET BY MOUTH EVERY DAY   QUEtiapine 300 MG tablet Commonly known as:  SEROQUEL TAKE 1 TABLET  BY MOUTH EVERYDAY AT BEDTIME   sucralfate 1 g tablet Commonly known as:  CARAFATE Take 1 tablet (1 g total) by mouth 4 (four) times daily.        DISCHARGE INSTRUCTIONS:  If you experience worsening of your admission symptoms, develop shortness of breath, life threatening emergency, suicidal or homicidal thoughts you must seek medical attention immediately by calling 911 or calling your MD immediately  if symptoms less severe.  You Must read complete instructions/literature along with all the possible adverse reactions/side effects for all the Medicines you take and that have been prescribed to you. Take any new Medicines after you have completely understood and accept all the possible adverse reactions/side effects.   Please note  You were cared for by a hospitalist during your hospital stay. If you have any questions about your discharge medications or the care you received while you were in the hospital after you are discharged, you can call the unit and asked to speak with the hospitalist on call if the hospitalist that took care of you is not available. Once you are discharged, your primary care physician will handle any further medical issues. Please note that NO REFILLS for any discharge medications will be authorized once you are discharged, as it is imperative that you return to your primary care physician (or establish a relationship with a primary care physician if you do not have one) for your aftercare needs so that they can reassess your need for medications and monitor your lab values.    Today   CHIEF COMPLAINT:   Chief Complaint  Patient presents with  . Shortness of Breath    HISTORY OF PRESENT ILLNESS:   52 y.o. female with a known history of ADHD, asthma, polysubstance abuse, bipolar disorder, CHF, ejection fraction around 42%, alcoholic liver cirrhosis, hypertension sleep apnea and other comorbidities. Patient presented to emergency room for acute onset of severe  right upper quadrant abdominal pain, associated with nausea and vomiting.  Patient denies having similar episodes in the past.  She did not eat anything unusual.  No new medications. Blood test done emergency room revealed glucose level at 208, creatinine 1.16, troponin 0 0.05, potassium 3.3.  WBCs within normal limits. Abdominal ultrasound shows gallbladder wall thickening and edema. No stones.  Patient is admitted for further evaluation and treatment.  VITAL SIGNS:  Blood pressure 132/82, pulse (!) 101, temperature 98.7 F (37.1 C), temperature source Oral, resp. rate 18, height 5' 4"  (1.626 m), weight 92.1 kg, SpO2 90 %.  I/O:    Intake/Output Summary (Last 24 hours) at 05/27/2018 1057 Last data filed at 05/27/2018 0948 Gross per 24 hour  Intake 603.38 ml  Output 400 ml  Net 203.38 ml    PHYSICAL EXAMINATION:  GENERAL:  52 y.o.-year-old patient lying in the bed with no acute distress.  EYES: Pupils equal, round, reactive  to light and accommodation. No scleral icterus. Extraocular muscles intact.  HEENT: Head atraumatic, normocephalic. Oropharynx and nasopharynx clear.  NECK:  Supple, no jugular venous distention. No thyroid enlargement, no tenderness.  LUNGS: Normal breath sounds bilaterally, no wheezing, rales,rhonchi or crepitation. No use of accessory muscles of respiration.  CARDIOVASCULAR: S1, S2 normal. No murmurs, rubs, or gallops.  ABDOMEN: Soft, non-tender, non-distended. Bowel sounds present. No organomegaly or mass.  EXTREMITIES: No pedal edema, cyanosis, or clubbing.  NEUROLOGIC: Cranial nerves II through XII are intact. Muscle strength 5/5 in all extremities. Sensation intact. Gait not checked.  PSYCHIATRIC: The patient is alert and oriented x 3.  SKIN: No obvious rash, lesion, or ulcer.   DATA REVIEW:   CBC Recent Labs  Lab 05/27/18 0446  WBC 6.8  HGB 8.4*  HCT 27.2*  PLT 209    Chemistries  Recent Labs  Lab 05/27/18 0446  NA 140  K 3.3*  CL 108   CO2 24  GLUCOSE 131*  BUN 12  CREATININE 1.03*  CALCIUM 8.6*  MG 1.8  AST 37  ALT 44  ALKPHOS 101  BILITOT 2.2*    Cardiac Enzymes Recent Labs  Lab 05/26/18 0717  TROPONINI 0.04*    Microbiology Results  Results for orders placed or performed during the hospital encounter of 05/25/18  Blood culture (routine x 2)     Status: None (Preliminary result)   Collection Time: 05/25/18 10:32 PM  Result Value Ref Range Status   Specimen Description BLOOD LEFT ANTECUBITAL  Final   Special Requests   Final    BOTTLES DRAWN AEROBIC AND ANAEROBIC Blood Culture adequate volume   Culture   Final    NO GROWTH 2 DAYS Performed at [facility], [address], [place], [place] [address]    Report Status PENDING  Incomplete  Blood culture (routine x 2)     Status: None (Preliminary result)   Collection Time: 05/25/18 10:32 PM  Result Value Ref Range Status   Specimen Description BLOOD LEFT HAND  Final   Special Requests   Final    BOTTLES DRAWN AEROBIC AND ANAEROBIC Blood Culture adequate volume   Culture   Final    NO GROWTH 2 DAYS Performed at [facility], [address] [place], [place] [address]    Report Status PENDING  Incomplete    RADIOLOGY:  Nm Hepatobiliary Liver Func  Result Date: 05/26/2018 CLINICAL DATA:  52 year old female with right upper quadrant pain. Subsequent encounter. EXAM: NUCLEAR MEDICINE HEPATOBILIARY IMAGING TECHNIQUE: Sequential images of the abdomen were obtained out to 60 minutes following intravenous administration of radiopharmaceutical. RADIOPHARMACEUTICALS:  7.454 mCi Tc-69m Choletec IV COMPARISON:  05/25/2018 ultrasound. FINDINGS: Prompt uptake and biliary excretion of activity by the liver is seen. Gallbladder activity is visualized, consistent with patency of cystic duct. Biliary activity passes into small bowel, consistent with patent common bile duct. IMPRESSION: 1. Gallbladder activity is visualized, consistent with patency  of cystic duct. 2. Biliary activity passes into small bowel, consistent with patent common bile duct. Electronically Signed   By: SGenia DelM.D.   On: 05/26/2018 12:59   Dg Chest Port 1 View  Result Date: 05/25/2018 CLINICAL DATA:  Shortness of breath. Dyspnea. Asthma. Congestive heart failure. Cirrhosis. EXAM: PORTABLE CHEST 1 VIEW COMPARISON:  05/19/2018 FINDINGS: Stable mild cardiomegaly. Pacemaker remains in appropriate position. Both lungs are well aerated and clear. IMPRESSION: No active disease. Electronically Signed   By: JEarle GellM.D.   On: 05/25/2018  21:19   US Abdomen Limited Ruq  Result Date: 05/25/2018 CLINICAL DATA:  Acute onset of upper abdominal pain today. EXAM: ULTRASOUND ABDOMEN LIMITED RIGHT UPPER QUADRANT COMPARISON:  Right upper quadrant ultrasound 02/14/2018. CT abdomen and pelvis 02/09/2018 FINDINGS: Gallbladder: No gallstones identified. Gallbladder wall thickening with pericholecystic edema. Gallbladder wall measures up to about 8 mm in thickness. Murphy's sign is negative although patient was given morphine before the scan limiting the sensitivity of this sign. Common bile duct: Diameter: 4.5 mm, normal Liver: No focal lesion identified. Within normal limits in parenchymal echogenicity. Portal vein is patent on color Doppler imaging with normal direction of blood flow towards the liver. IMPRESSION: Gallbladder wall thickening and edema. No stones. Changes may be due to acalculous cholecystitis, liver disease, or other inflammatory hepatobiliary process. Could consider radionuclide biliary scan for further evaluation. Electronically Signed   By: Lucienne Capers M.D.   On: 05/25/2018 21:45    EKG:   Orders placed or performed in visit on 05/25/18  . EKG 12-Lead  . EKG 12-Lead  . EKG 12-Lead      Management plans discussed with the patient, family and they are in agreement.  CODE STATUS:     Code Status Orders  (From admission, onward)         Start      Ordered   05/26/18 0254  Full code  Continuous     05/26/18 0253        Code Status History    Date Active Date Inactive Code Status Order ID Comments User Context   05/08/2018 1627 05/09/2018 1834 Full Code [id number]  Hillary Bow, MD ED   04/25/2018 2135 04/27/2018 1510 Full Code [id number]  Dustin Flock, MD Inpatient   07/03/2017 0100 07/05/2017 1806 Full Code [id number]  Harrie Foreman, MD Inpatient   09/17/2016 1712 09/18/2016 2025 Full Code [id number]  Fritzi Mandes, MD Inpatient   09/10/2016 1105 09/11/2016 2045 Full Code [id number]  Dustin Flock, MD Inpatient   05/30/2016 1129 06/04/2016 2226 Full Code [id number]  Fritzi Mandes, MD Inpatient      TOTAL TIME TAKING CARE OF THIS PATIENT: 45 minutes.    Avel Peace Salary M.D on 05/27/2018 at 10:57 AM  Between 7am to 6pm - Pager - [phone]  After 6pm go to [email or web address] - password [email or web address] [facility]  Office  [phone]  CC: Primary care physician; Valerie Roys, DO   Note: This dictation was prepared with Dragon dictation along with smaller phrase technology. Any transcriptional errors that result from this process are unintentional.

## 2018-05-28 ENCOUNTER — Inpatient Hospital Stay: Payer: Medicare Other

## 2018-05-28 DIAGNOSIS — R1011 Right upper quadrant pain: Secondary | ICD-10-CM

## 2018-05-28 LAB — BASIC METABOLIC PANEL
Anion gap: 9 (ref 5–15)
BUN: 15 mg/dL (ref 6–20)
CHLORIDE: 105 mmol/L (ref 98–111)
CO2: 22 mmol/L (ref 22–32)
CREATININE: 0.95 mg/dL (ref 0.44–1.00)
Calcium: 8.7 mg/dL — ABNORMAL LOW (ref 8.9–10.3)
GFR calc Af Amer: >60 mL/min (ref >60)
GFR calc non Af Amer: >60 mL/min (ref >60)
Glucose, Bld: 144 mg/dL — ABNORMAL HIGH (ref 70–99)
Potassium: 3.7 mmol/L (ref 3.5–5.1)
SODIUM: 136 mmol/L (ref 135–145)

## 2018-05-28 LAB — GLUCOSE, CAPILLARY
GLUCOSE-CAPILLARY: 147 mg/dL — AB (ref 70–99)
Glucose-Capillary: 198 mg/dL — ABNORMAL HIGH (ref 70–99)
Glucose-Capillary: 158 mg/dL — ABNORMAL HIGH (ref 70–99)
Glucose-Capillary: 151 mg/dL — ABNORMAL HIGH (ref 70–99)

## 2018-05-28 LAB — BRAIN NATRIURETIC PEPTIDE: B Natriuretic Peptide: 1003 pg/mL — ABNORMAL HIGH (ref 0.0–100.0)

## 2018-05-28 LAB — SEDIMENTATION RATE: Sed Rate: 64 mm/hr — ABNORMAL HIGH (ref 0–30)

## 2018-05-28 LAB — LACTIC ACID, PLASMA
LACTIC ACID, VENOUS: 1.6 mmol/L (ref 0.5–1.9)
Lactic Acid, Venous: 1.5 mmol/L (ref 0.5–1.9)

## 2018-05-28 LAB — LIPASE, BLOOD: LIPASE: 23 U/L (ref 11–51)

## 2018-05-28 MED ORDER — IOPAMIDOL (ISOVUE-300) INJECTION 61%
100.0000 mL | Freq: Once | INTRAVENOUS | Status: AC | PRN
  Administered 2018-05-28: 100 mL via INTRAVENOUS

## 2018-05-28 MED ORDER — IOPAMIDOL (ISOVUE-300) INJECTION 61%
15.0000 mL | INTRAVENOUS | Status: AC
  Administered 2018-05-28 (×2): 15 mL via ORAL

## 2018-05-28 MED ORDER — MORPHINE SULFATE (PF) 4 MG/ML IV SOLN
3.0000 mg | Freq: Once | INTRAVENOUS | Status: AC
  Administered 2018-05-28: 3 mg via INTRAVENOUS
  Filled 2018-05-28: qty 1

## 2018-05-28 MED ORDER — DICYCLOMINE HCL 20 MG PO TABS
20.0000 mg | ORAL_TABLET | Freq: Three times a day (TID) | ORAL | Status: DC
  Administered 2018-05-28 – 2018-06-01 (×17): 20 mg via ORAL
  Filled 2018-05-28 (×20): qty 1

## 2018-05-28 MED ORDER — PANCRELIPASE (LIP-PROT-AMYL) 12000-38000 UNITS PO CPEP
24000.0000 [IU] | ORAL_CAPSULE | Freq: Three times a day (TID) | ORAL | Status: DC
  Administered 2018-05-29 – 2018-06-01 (×11): 24000 [IU] via ORAL
  Filled 2018-05-28 (×14): qty 2

## 2018-05-28 NOTE — Consult Note (Signed)
Jonathon Bellows , MD [address] [place], [place], [place], [address] [address], [place], [place], [place], [address] Phone: [phone]  Fax: [phone]  Consultation  Referring Provider:  Dr Jerelyn Charles  Primary Care Physician:  Valerie Roys, DO Primary Gastroenterologist:  Dr. Bonna Gains         Reason for Consultation: abdominal pain and nausea.   Date of Admission:  05/25/2018 Date of Consultation:  05/28/2018         HPI:   Bonnie Ochoa is a 52 y.o. female is a patient of Dr Adolphus Birchwood at our clinic and was last seen on 03/06/18 . Followed for cirrhosis of the liver attributed to heart failure.  She states that she has been having a lot of right-sided abdominal discomfort which has been continuous waking her up from the sleep ongoing for a week.  Extensive nausea.  No vomiting.  No use of NSAIDs.  The discomfort is localized nonradiating.  All the symptoms significantly improved when she has a hot shower.  She does admit to use of cannabis.  The cannabis is supplied by a friend.  When I went to see her she appeared comfortable not in any pain or distress she says she is able to keep food down.  Denies ever having an endoscopy or colonoscopy.  She has no known family history of colon cancer or polyps.  RUQ USG 05/25/18 - gall bladder wall thickening ?acalculous cholecystitis  HIDA scan 05/26/18 - patent CBD and cystic duct.   She presented to the ER on 05/26/18 with RUQ pain , nausea and vomiting .   Past Medical History:  Diagnosis Date  . ADHD   . Arthritis   . Asthma   . Bipolar 1 disorder ([place])   . CHF (congestive heart failure) ([place])   . Cirrhosis of liver ([place])   . Coronary artery disease   . Depression   . Diabetes mellitus without complication ([place])   . Diverticulitis   . Hypertension   . IBS (irritable bowel syndrome)   . Insomnia   . Migraines   . PTSD (post-traumatic stress disorder)   . Restless leg syndrome   . Sleep apnea   . Vertigo     Past  Surgical History:  Procedure Laterality Date  . ABDOMINAL HYSTERECTOMY    . debribalator  2016  . INSERT / REPLACE / REMOVE PACEMAKER    . INSERTION OF ICD    . TONSILECTOMY, ADENOIDECTOMY, BILATERAL MYRINGOTOMY AND TUBES    . TONSILLECTOMY    . TONSILLECTOMY    . TUBAL LIGATION  1980  . TUBAL LIGATION      Prior to Admission medications   Medication Sig Start Date End Date Taking? Authorizing Provider  atorvastatin (LIPITOR) 40 MG tablet TAKE 1 TABLET BY MOUTH EVERY DAY--NEEDS APPT BEFORE RUN OUT 04/25/18  Yes Johnson, Megan P, DO  carvedilol (COREG) 6.25 MG tablet Take 1 tablet (6.25 mg total) by mouth 2 (two) times daily with a meal. 05/09/18  Yes Max Sane, MD  dicyclomine (BENTYL) 20 MG tablet Take 1 tablet (20 mg total) by mouth 3 (three) times daily as needed for spasms. 05/08/18  Yes Johnson, Megan P, DO  famotidine (PEPCID) 40 MG tablet Take 1 tablet (40 mg total) by mouth every evening. 04/25/18 04/25/19 Yes Johnson, Megan P, DO  Insulin Glargine (LANTUS SOLOSTAR) 100 UNIT/ML Solostar Pen Inject 20 Units into the skin daily at 10 pm. 04/27/18  Yes Bettey Costa, MD  lamoTRIgine (  LAMICTAL) 25 MG tablet Take 1 tablet (25 mg total) by mouth 2 (two) times daily. 03/25/18  Yes [name], MD  metoCLOPramide (REGLAN) 10 MG tablet Take 1 tablet (10 mg total) by mouth 3 (three) times daily with meals. 05/01/18 05/01/19 Yes Johnson, Megan P, DO  pantoprazole (PROTONIX) 40 MG tablet Take 1 tablet (40 mg total) by mouth daily. 08/28/17 08/28/18 Yes [name], MD  PARoxetine (PAXIL) 30 MG tablet TAKE 1 TABLET BY MOUTH EVERY DAY 03/14/18  Yes Volney American, PA-C  QUEtiapine (SEROQUEL) 300 MG tablet TAKE 1 TABLET BY MOUTH EVERYDAY AT BEDTIME 03/14/18  Yes Volney American, PA-C  sucralfate (CARAFATE) 1 g tablet Take 1 tablet (1 g total) by mouth 4 (four) times daily. 05/01/18  Yes Johnson, Megan P, DO  blood glucose meter kit and supplies KIT Dispense based on patient and insurance  preference. Use up to four times daily as directed. (FOR ICD-9 250.00, 250.01). 07/24/17   Johnson, Megan P, DO  Insulin Pen Needle 32G X 6 MM MISC 1 each by Does not apply route daily. 04/07/18   Johnson, Megan P, DO  neomycin-polymyxin-hydrocortisone (CORTISPORIN) OTIC solution Place 3 drops into the right ear 4 (four) times daily. Patient not taking: Reported on 05/08/2018 09/12/17   Park Liter P, DO  nitroGLYCERIN (NITROSTAT) 0.4 MG SL tablet Place 1 tablet (0.4 mg total) under the tongue every 5 (five) minutes as needed for chest pain. 05/19/18 05/19/19  [name], MD  ondansetron (ZOFRAN ODT) 4 MG disintegrating tablet Take 1 tablet (4 mg total) by mouth every 8 (eight) hours as needed for nausea or vomiting. Patient not taking: Reported on 05/08/2018 02/14/18   Nance Pear, MD    Family History  Problem Relation Age of Onset  . Hypertension Mother   . Hyperlipidemia Mother   . Heart disease Mother   . Hypertension Sister   . Asthma Sister   . Heart disease Sister   . Diabetes Sister   . Cancer Sister   . Alzheimer's disease Maternal Grandfather   . Hyperlipidemia Brother   . Asthma Sister   . Hypertension Sister   . Diabetes Sister      Social History   Tobacco Use  . Smoking status: Former Smoker    Types: Cigarettes  . Smokeless tobacco: Never Used  . Tobacco comment: quit about 20 years ago   Substance Use Topics  . Alcohol use: Not Currently  . Drug use: Yes    Frequency: 1.0 times per week    Types: Marijuana    Allergies as of 05/25/2018 - Review Complete 05/25/2018  Allergen Reaction Noted  . Levothyroxine Rash 07/02/2017    Review of Systems:    All systems reviewed and negative except where noted in HPI.   Physical Exam:  Vital signs in last 24 hours: Temp:  [97.9 F (36.6 C)-98.4 F (36.9 C)] 98.4 F (36.9 C) (10/16 0753) Pulse Rate:  [91-99] 99 (10/16 0753) Resp:  [16-20] 16 (10/16 0753) BP: (108-127)/(82-103) 127/103 (10/16 0753) SpO2:   [97 %-98 %] 97 % (10/16 0753) Weight:  [92.3 kg] 92.3 kg (10/16 0416) Last BM Date: 05/27/18 General:   Pleasant, cooperative in NAD Head:  Normocephalic and atraumatic. Eyes:   No icterus.   Conjunctiva pink. PERRLA. Ears:  Normal auditory acuity. Neck:  Supple; no masses or thyroidomegaly Lungs: Respirations even and unlabored. Lungs clear to auscultation bilaterally.   No wheezes, crackles, or rhonchi.  Heart:  Regular rate and  rhythm;  Without murmur, clicks, rubs or gallops Abdomen:  Soft, nondistended, nontender. Normal bowel sounds. No appreciable masses or hepatomegaly.  No rebound or guarding.  Neurologic:  Alert and oriented x3;  grossly normal neurologically. Skin:  Intact without significant lesions or rashes. Cervical Nodes:  No significant cervical adenopathy. Psych:  Alert and cooperative. Normal affect.  LAB RESULTS: Recent Labs    05/25/18 2109 05/26/18 0717 05/27/18 0446  WBC 8.5 8.8 6.8  HGB 9.6* 9.0* 8.4*  HCT 30.6* 28.9* 27.2*  PLT 243 242 209   BMET Recent Labs    05/26/18 0717 05/27/18 0446 05/28/18 0646  NA 137 140 136  K 3.2* 3.3* 3.7  CL 103 108 105  CO2 _0 GLUCOSE 213* 131* 144*  BUN _1 CREATININE 0.90 1.03* 0.95  CALCIUM 8.5* 8.6* 8.7*   LFT Recent Labs    05/27/18 0446  PROT 7.0  ALBUMIN 3.6  AST 37  ALT 44  ALKPHOS 101  BILITOT 2.2*   PT/INR No results for input(s): LABPROT, INR in the last 72 hours.  STUDIES: Nm Hepatobiliary Liver Func  Result Date: 05/26/2018 CLINICAL DATA:  52 year old female with right upper quadrant pain. Subsequent encounter. EXAM: NUCLEAR MEDICINE HEPATOBILIARY IMAGING TECHNIQUE: Sequential images of the abdomen were obtained out to 60 minutes following intravenous administration of radiopharmaceutical. RADIOPHARMACEUTICALS:  7.454 mCi Tc-44m Choletec IV COMPARISON:  05/25/2018 ultrasound. FINDINGS: Prompt uptake and biliary excretion of activity by the liver is seen. Gallbladder  activity is visualized, consistent with patency of cystic duct. Biliary activity passes into small bowel, consistent with patent common bile duct. IMPRESSION: 1. Gallbladder activity is visualized, consistent with patency of cystic duct. 2. Biliary activity passes into small bowel, consistent with patent common bile duct. Electronically Signed   By: SGenia DelM.D.   On: 05/26/2018 12:59      Impression / Plan:   CKylee Umanais a 52y.o. y/o female with history of liver cirrhosis attributed to heart failure.  She is admitted with right-sided abdominal discomfort along with extensive nausea.  She does give a history that the symptoms are resolved when she takes a hot shower.  Does admit to cannabis use.  The differentials for her condition are cannabinoid hyperemesis syndrome versus hepatic congestion from possible heart failure.  Other less likely causes could include portal vein thrombosis although she appears markedly comfortable at this point of time.  Ms. PRuttanPlan 1.  Suggest CT scan of the abdomen to evaluate the colon rest of the organs and portal veins.  If negative I would lean more on the diagnosis of cannabinoid hyperemesis syndrome.  The fact that she is able to lay flat makes it less likely that she has significant heart failure at this point of time.  I will also check her BNP. Thank you for involving me in the care of this patient.      LOS: 2 days   KJonathon Bellows MD  05/28/2018, 9:57 AM

## 2018-05-28 NOTE — Progress Notes (Signed)
Pharmacy Electrolyte Monitoring Consult:  Pharmacy consulted to assist in monitoring and replacing electrolytes in this 52 y.o. female admitted on 05/25/2018 with Shortness of Breath   Labs:  Sodium (mmol/L)  Date Value  05/28/2018 136  05/01/2018 142   Potassium (mmol/L)  Date Value  05/28/2018 3.7   Magnesium (mg/dL)  Date Value  27/51/7001 1.8   Phosphorus (mg/dL)  Date Value  74/94/4967 3.0   Calcium (mg/dL)  Date Value  59/16/3846 8.7 (L)   Albumin (g/dL)  Date Value  65/99/3570 3.6  04/07/2018 4.8    Assessment/Plan:  K and Mag and phos wnl No replenishment needed at this time  Albina Billet, PharmD Clinical Pharmacist 05/28/2018 9:19 AM

## 2018-05-28 NOTE — Progress Notes (Signed)
[employer] [facility] [place] [facility]   PATIENT NAME: Bonnie Ochoa    MR#:  [id number]  DATE OF BIRTH:  January 19, 1966  SUBJECTIVE:  CHIEF COMPLAINT:   Chief Complaint  Patient presents with  . Shortness of Breath  Continues to complain of abdominal pain, gastroenterology input appreciated, abnormal repeat CT scan of the abdomen noted REVIEW OF SYSTEMS:  CONSTITUTIONAL: No fever, fatigue or weakness.  EYES: No blurred or double vision.  EARS, NOSE, AND THROAT: No tinnitus or ear pain.  RESPIRATORY: No cough, shortness of breath, wheezing or hemoptysis.  CARDIOVASCULAR: No chest pain, orthopnea, edema.  GASTROINTESTINAL: No nausea, vomiting, diarrhea or abdominal pain.  GENITOURINARY: No dysuria, hematuria.  ENDOCRINE: No polyuria, nocturia,  HEMATOLOGY: No anemia, easy bruising or bleeding SKIN: No rash or lesion. MUSCULOSKELETAL: No joint pain or arthritis.   NEUROLOGIC: No tingling, numbness, weakness.  PSYCHIATRY: No anxiety or depression.   ROS  DRUG ALLERGIES:   Allergies  Allergen Reactions  . Levothyroxine Rash    VITALS:  Blood pressure (!) 120/93, pulse 88, temperature 98.3 F (36.8 C), temperature source Oral, resp. rate 20, height 5\' 4"  (1.626 m), weight 92.3 kg, SpO2 98 %.  PHYSICAL EXAMINATION:  GENERAL:  52 y.o.-year-old patient lying in the bed with no acute distress.  EYES: Pupils equal, round, reactive to light and accommodation. No scleral icterus. Extraocular muscles intact.  HEENT: Head atraumatic, normocephalic. Oropharynx and nasopharynx clear.  NECK:  Supple, no jugular venous distention. No thyroid enlargement, no tenderness.  LUNGS: Normal breath sounds bilaterally, no wheezing, rales,rhonchi or crepitation. No use of accessory muscles of respiration.  CARDIOVASCULAR: S1, S2 normal. No murmurs, rubs, or gallops.  ABDOMEN: Soft, nontender, nondistended. Bowel sounds present. No organomegaly or mass.  EXTREMITIES: No pedal edema,  cyanosis, or clubbing.  NEUROLOGIC: Cranial nerves II through XII are intact. Muscle strength 5/5 in all extremities. Sensation intact. Gait not checked.  PSYCHIATRIC: The patient is alert and oriented x 3.  SKIN: No obvious rash, lesion, or ulcer.   Physical Exam LABORATORY PANEL:   CBC Recent Labs  Lab 05/27/18 0446  WBC 6.8  HGB 8.4*  HCT 27.2*  PLT 209   ------------------------------------------------------------------------------------------------------------------  Chemistries  Recent Labs  Lab 05/27/18 0446 05/28/18 0646  NA 140 136  K 3.3* 3.7  CL 108 105  CO2 24 22  GLUCOSE 131* 144*  BUN 12 15  CREATININE 1.03* 0.95  CALCIUM 8.6* 8.7*  MG 1.8  --   AST 37  --   ALT 44  --   ALKPHOS 101  --   BILITOT 2.2*  --    ------------------------------------------------------------------------------------------------------------------  Cardiac Enzymes Recent Labs  Lab 05/25/18 2109 05/26/18 0717  TROPONINI 0.05* 0.04*   ------------------------------------------------------------------------------------------------------------------  RADIOLOGY:  Ct Abdomen Pelvis W Contrast  Result Date: 05/28/2018 CLINICAL DATA:  Cirrhosis of the liver, heart failure some abdominal discomfort primarily right-sided, nausea EXAM: CT ABDOMEN AND PELVIS WITH CONTRAST TECHNIQUE: Multidetector CT imaging of the abdomen and pelvis was performed using the standard protocol following bolus administration of intravenous contrast. CONTRAST:  ISOVUE-300 IOPAMIDOL (ISOVUE-300) INJECTION 61% COMPARISON:  CT abdomen pelvis of 02/09/2017 FINDINGS: Lower chest: There is linear atelectasis or scarring in the anterior left lower lobe. No pneumonia or effusion is evident. There is moderate cardiomegaly noted. No pericardial effusion is seen. Hepatobiliary: The liver is somewhat inhomogeneous with slightly nodular contours consistent with diagnosis of cirrhosis. No focal hepatic abnormality is  seen. However, the gallbladder appears to  have a somewhat thickened wall. No definite calcified gallstones are seen but either ultrasound of the right upper quadrant or nuclear medicine hepatobiliary scan would be recommended to evaluate for possible cholecystitis. Prominent gallbladder wall could also be seen in hypoproteinemia. Pancreas: The head of the pancreas is very indistinct and there is some peripancreatic fluid extending into the anterior pararenal space. Acute pancreatitis of the pancreatic head is a definite consideration. The body and tail of the pancreas is unremarkable and the pancreatic duct is not dilated. Correlate with appropriate laboratory values. Spleen: The spleen is unremarkable.  A splenule is noted medially Adrenals/Urinary Tract: The adrenal glands appear normal. The kidneys enhance with no calculus or mass. On delayed images, the pelvocaliceal systems are unremarkable. The ureters appear normal caliber. The urinary bladder is not well distended but no abnormality is seen. Stomach/Bowel: The stomach is moderately distended with food debris and oral contrast media with no definite abnormality. There is some indistinctness of the descending duodenum which most likely is related to possible inflammatory process as described above involving the head of the pancreas. Stepped contrast is scattered throughout small bowel which is nondistended. There are a few rectosigmoid diverticula present. There is a moderate amount of feces throughout the colon. Terminal ileum and the appendix are unremarkable. Vascular/Lymphatic: The abdominal aorta is normal in caliber with mild to moderate abdominal aortic atherosclerosis present. Only a few small retroperitoneal nodes are present. Reproductive: The uterus has previously been resected. No adnexal lesion is seen. No fluid is noted within the pelvis. Other: No abdominal wall hernia is seen. Musculoskeletal: The lumbar vertebrae are in normal alignment.  Degenerative disc disease is present primarily at L4-5. IMPRESSION: 1. Indistinctness around the head of the pancreas with some fluid in the anterior pararenal space may indicate acute pancreatitis involving the head of the pancreas. Correlate with appropriate laboratory values. 2. Changes of hepatic cirrhosis.  No focal hepatic abnormality. 3. Somewhat thickened gallbladder wall most likely due to hypoproteinemia but developing acute cholecystitis cannot be excluded. Correlate clinically. 4. Moderate abdominal aortic atherosclerosis. Electronically Signed   By: Dwyane Dee M.D.   On: 05/28/2018 13:56    ASSESSMENT AND PLAN:  *Acute recurrent right upper quadrant pain Etiology unknown- ? Due to chronic liver disease HIDA scan was a negative study, gastroenterology input appreciated, will ask general surgery to reevaluate given recurrent abdominal pain/abnormal CT findings of the abdomen per below. Repeat CT of the abdomen: 1. Indistinctness around the head of the pancreas with some fluid in the anterior pararenal space may indicate acute pancreatitis involving the head of the pancreas. Correlate with appropriate laboratory values. 2. Changes of hepatic cirrhosis.  No focal hepatic abnormality. 3. Somewhat thickened gallbladder wall most likely due to hypoproteinemia but developing acute cholecystitis cannot be excluded. Correlate clinically. 4. Moderate abdominal aortic atherosclerosis.  *Acute elevated troponins Inconsistent with acute coronary syndrome Cardiac enzymes negative for acute coronary syndrome  *HFrEF secondary to NICM status post ICD placement, EF 20% Stable on current regiment  *CKD 3 Stable  *Chronic diabetes mellitus type 2  Controlled on current regiment  *Chronic alcoholic liver cirrhosis  Stable  Need to continue to follow-up with gastroenterology status post discharge  for continued care/medical monitoring  All the records are reviewed and case  discussed with Care Management/Social Workerr. Management plans discussed with the patient, family and they are in agreement.  CODE STATUS: full  TOTAL TIME TAKING CARE OF THIS PATIENT: 40 minutes.     POSSIBLE D/C  IN 1-2 DAYS, DEPENDING ON CLINICAL CONDITION.   Evelena Asa Salary M.D on 05/28/2018   Between 7am to 6pm - Pager - [phone]  After 6pm go to [email or web address] - password [email or web address]  [facility]  Office  [phone]  CC: Primary care physician; Dorcas Carrow, DO  Note: This dictation was prepared with Dragon dictation along with smaller phrase technology. Any transcriptional errors that result from this process are unintentional.

## 2018-05-29 LAB — CBC
HEMATOCRIT: 27.8 % — AB (ref 36.0–46.0)
Hemoglobin: 8.5 g/dL — ABNORMAL LOW (ref 12.0–15.0)
MCH: 30.6 pg (ref 26.0–34.0)
MCHC: 30.6 g/dL (ref 30.0–36.0)
MCV: 100 fL (ref 80.0–100.0)
NRBC: 0.6 % — AB (ref 0.0–0.2)
PLATELETS: 198 10*3/uL (ref 150–400)
RBC: 2.78 MIL/uL — ABNORMAL LOW (ref 3.87–5.11)
RDW: 15.9 % — AB (ref 11.5–15.5)
WBC: 6.8 10*3/uL (ref 4.0–10.5)

## 2018-05-29 LAB — COMPREHENSIVE METABOLIC PANEL
ALT: 545 U/L — AB (ref 0–44)
AST: 432 U/L — AB (ref 15–41)
Albumin: 3.6 g/dL (ref 3.5–5.0)
Alkaline Phosphatase: 125 U/L (ref 38–126)
Anion gap: 11 (ref 5–15)
BUN: 13 mg/dL (ref 6–20)
CHLORIDE: 105 mmol/L (ref 98–111)
CO2: 22 mmol/L (ref 22–32)
CREATININE: 0.94 mg/dL (ref 0.44–1.00)
Calcium: 8.6 mg/dL — ABNORMAL LOW (ref 8.9–10.3)
Glucose, Bld: 92 mg/dL (ref 70–99)
Potassium: 3.5 mmol/L (ref 3.5–5.1)
Sodium: 138 mmol/L (ref 135–145)
TOTAL PROTEIN: 7.6 g/dL (ref 6.5–8.1)
Total Bilirubin: 2.5 mg/dL — ABNORMAL HIGH (ref 0.3–1.2)

## 2018-05-29 LAB — GLUCOSE, CAPILLARY
GLUCOSE-CAPILLARY: 207 mg/dL — AB (ref 70–99)
GLUCOSE-CAPILLARY: 134 mg/dL — AB (ref 70–99)
Glucose-Capillary: 101 mg/dL — ABNORMAL HIGH (ref 70–99)
Glucose-Capillary: 181 mg/dL — ABNORMAL HIGH (ref 70–99)

## 2018-05-29 LAB — LIPASE, BLOOD: LIPASE: 21 U/L (ref 11–51)

## 2018-05-29 MED ORDER — MORPHINE SULFATE (PF) 2 MG/ML IV SOLN
2.0000 mg | INTRAVENOUS | Status: DC | PRN
  Administered 2018-05-29 – 2018-06-01 (×12): 2 mg via INTRAVENOUS
  Filled 2018-05-29 (×12): qty 1

## 2018-05-29 NOTE — Progress Notes (Signed)
Pharmacy Electrolyte Monitoring Consult:  Pharmacy consulted to assist in monitoring and replacing electrolytes (potassium) in this 52 y.o. female admitted on 05/25/2018 with Shortness of Breath   Labs:  Sodium (mmol/L)  Date Value  05/29/2018 138  05/01/2018 142   Potassium (mmol/L)  Date Value  05/29/2018 3.5   Magnesium (mg/dL)  Date Value  81/82/9937 1.8   Phosphorus (mg/dL)  Date Value  16/96/7893 3.0   Calcium (mg/dL)  Date Value  81/08/7508 8.6 (L)   Albumin (g/dL)  Date Value  25/85/2778 3.6  04/07/2018 4.8    Assessment/Plan:  K wnl No replenishment needed at this time Will continue to follow pt potassium  Albina Billet, PharmD Clinical Pharmacist 05/29/2018 7:51 AM

## 2018-05-29 NOTE — Progress Notes (Signed)
Brief Follow Up Progress Note  Asked by Hospitalist to follow up on this patient previously signed off on to evaluate for developing cholecystitis and worsening LFTs.   Patient reports no changes in her right abdominal pain since admission. Endorses some nausea bu no emesis, fever, chills. She notes that the pain medicine has helped with the pain.   Reviewed patient's case with Dr. Earlene Plater  -- HIDA scan again reviewed and not consistent with acute cholecystitis with a patent cystic duct which is a specific finding. While, gallbladder wall thickening in the context of CHF and cirrhosis is non-specific.  -- Her elevated LFTs are most likely a progression of her chronic liver disease given her known history of alcoholic cirrhosis and CHF -- Pancreatitis was considered however less likely given a Lipase that is WNL. Likely causes would be alcoholic vs biliary although again less likely given normal lipase.  -- If there is concern that choledocholithiasis is the cause to her elevated LFTs and RUQ pain, would recommend MRCP to further evaluate.   D/W Dr. Earlene Plater and Hospitalist on 2C. Please re-consult if new issues or concerns arise.   ------ Lynden Oxford, PA-C [facility] 05/29/2018, 1:52 PM [phone] M-F: 7am - 4pm

## 2018-05-29 NOTE — Progress Notes (Signed)
Bonnie Minium, MD [facility]   [address] [place], [place] [address] Phone: [phone] Fax : [phone]   Subjective: The patient continues to have abdominal pain.  The patient states her abdominal pain is in the mid right quadrant.  Since yesterday the patient had a lipase that was normal with the patient LFTs to have increased dramatically i since yesterday.  The patient's AST has gone up to 432 with the ALT of 545 with a normal alkaline phosphatase and the bilirubin has increased to 2.5.   Objective: Vital signs in last 24 hours: Vitals:   05/29/18 0453 05/29/18 0500 05/29/18 0800 05/29/18 1438  BP: (!) 124/97  (!) 135/93 121/88  Pulse: 90  86 80  Resp: 20  15 18   Temp: 98 F (36.7 C)  99.6 F (37.6 C) 98.5 F (36.9 C)  TempSrc: Oral  Oral Oral  SpO2: 94%  100% 99%  Weight:  92.9 kg    Height:       Weight change: 0.551 kg  Intake/Output Summary (Last 24 hours) at 05/29/2018 1609 Last data filed at 05/29/2018 0900 Gross per 24 hour  Intake 3 ml  Output 400 ml  Net -397 ml     Exam: Heart:: Regular rate and rhythm, S1S2 present or without murmur or extra heart sounds Lungs: normal and clear to auscultation and percussion Abdomen: Soft nondistended with positive tenderness in the right middle quadrant   Lab Results: @LABTEST2 @ Micro Results: Recent Results (from the past 240 hour(s))  Blood culture (routine x 2)     Status: None (Preliminary result)   Collection Time: 05/25/18 10:32 PM  Result Value Ref Range Status   Specimen Description BLOOD LEFT ANTECUBITAL  Final   Special Requests   Final    BOTTLES DRAWN AEROBIC AND ANAEROBIC Blood Culture adequate volume   Culture   Final    NO GROWTH 4 DAYS Performed at [facility], [address] [place], [place] [address]    Report Status PENDING  Incomplete  Blood culture (routine x 2)     Status: None (Preliminary result)   Collection Time: 05/25/18 10:32 PM  Result Value Ref Range Status    Specimen Description BLOOD LEFT HAND  Final   Special Requests   Final    BOTTLES DRAWN AEROBIC AND ANAEROBIC Blood Culture adequate volume   Culture   Final    NO GROWTH 4 DAYS Performed at [facility], [address], [place], [place] [address]    Report Status PENDING  Incomplete   Studies/Results: Ct Abdomen Pelvis W Contrast  Result Date: 05/28/2018 CLINICAL DATA:  Cirrhosis of the liver, heart failure some abdominal discomfort primarily right-sided, nausea EXAM: CT ABDOMEN AND PELVIS WITH CONTRAST TECHNIQUE: Multidetector CT imaging of the abdomen and pelvis was performed using the standard protocol following bolus administration of intravenous contrast. CONTRAST:  ISOVUE-300 IOPAMIDOL (ISOVUE-300) INJECTION 61% COMPARISON:  CT abdomen pelvis of 02/09/2017 FINDINGS: Lower chest: There is linear atelectasis or scarring in the anterior left lower lobe. No pneumonia or effusion is evident. There is moderate cardiomegaly noted. No pericardial effusion is seen. Hepatobiliary: The liver is somewhat inhomogeneous with slightly nodular contours consistent with diagnosis of cirrhosis. No focal hepatic abnormality is seen. However, the gallbladder appears to have a somewhat thickened wall. No definite calcified gallstones are seen but either ultrasound of the right upper quadrant or nuclear medicine hepatobiliary scan would be recommended to evaluate for possible cholecystitis. Prominent gallbladder wall could also  be seen in hypoproteinemia. Pancreas: The head of the pancreas is very indistinct and there is some peripancreatic fluid extending into the anterior pararenal space. Acute pancreatitis of the pancreatic head is a definite consideration. The body and tail of the pancreas is unremarkable and the pancreatic duct is not dilated. Correlate with appropriate laboratory values. Spleen: The spleen is unremarkable.  A splenule is noted medially Adrenals/Urinary Tract: The adrenal glands  appear normal. The kidneys enhance with no calculus or mass. On delayed images, the pelvocaliceal systems are unremarkable. The ureters appear normal caliber. The urinary bladder is not well distended but no abnormality is seen. Stomach/Bowel: The stomach is moderately distended with food debris and oral contrast media with no definite abnormality. There is some indistinctness of the descending duodenum which most likely is related to possible inflammatory process as described above involving the head of the pancreas. Stepped contrast is scattered throughout small bowel which is nondistended. There are a few rectosigmoid diverticula present. There is a moderate amount of feces throughout the colon. Terminal ileum and the appendix are unremarkable. Vascular/Lymphatic: The abdominal aorta is normal in caliber with mild to moderate abdominal aortic atherosclerosis present. Only a few small retroperitoneal nodes are present. Reproductive: The uterus has previously been resected. No adnexal lesion is seen. No fluid is noted within the pelvis. Other: No abdominal wall hernia is seen. Musculoskeletal: The lumbar vertebrae are in normal alignment. Degenerative disc disease is present primarily at L4-5. IMPRESSION: 1. Indistinctness around the head of the pancreas with some fluid in the anterior pararenal space may indicate acute pancreatitis involving the head of the pancreas. Correlate with appropriate laboratory values. 2. Changes of hepatic cirrhosis.  No focal hepatic abnormality. 3. Somewhat thickened gallbladder wall most likely due to hypoproteinemia but developing acute cholecystitis cannot be excluded. Correlate clinically. 4. Moderate abdominal aortic atherosclerosis. Electronically Signed   By: Dwyane Dee M.D.   On: 05/28/2018 13:56   Medications: I have reviewed the patient's current medications. Scheduled Meds: . carvedilol  12.5 mg Oral BID WC  . dicyclomine  20 mg Oral TID AC & HS  . docusate sodium   100 mg Oral BID  . heparin  5,000 Units Subcutaneous Q8H  . insulin aspart  0-15 Units Subcutaneous TID WC  . insulin aspart  0-5 Units Subcutaneous QHS  . lamoTRIgine  25 mg Oral BID  . lipase/protease/amylase  24,000 Units Oral TID WC  . pantoprazole  40 mg Oral Daily  . PARoxetine  30 mg Oral Daily  . QUEtiapine  300 mg Oral QHS  . sodium chloride flush  3 mL Intravenous Q12H   Continuous Infusions: . sodium chloride Stopped (05/26/18 1412)   PRN Meds:.sodium chloride, bisacodyl, hydrALAZINE, morphine injection, ondansetron **OR** ondansetron (ZOFRAN) IV, sodium chloride flush, traMADol, traZODone   Assessment: Active Problems:   Acute cholecystitis   Abdominal pain, right upper quadrant   Hyperbilirubinemia    Plan: This patient has abdominal pain of unknown etiology with a CT scan showing some fluid around the head of the pancreas with the recommendation to correlate with patient's pancreatic enzymes for possible pancreatitis.  The patient's lipase is normal.  The patient has had a bump in her LFTs.  The patient will have labs sent off for possible causes of the increased liver enzymes.  The patient denies any high risk activities such as homemade tattoos, blood transfusions or sexual contact with high risk individuals.  With the patient's alkaline phosphatase being normal this is not consistent  with stone disease or biliary obstruction but more likely with hepatocellular damage.   LOS: 3 days   Darnise Montag 05/29/2018, 4:09 PM

## 2018-05-29 NOTE — Progress Notes (Signed)
[employer] [facility] [place] at [facility]   PATIENT NAME: Bonnie Ochoa    MR#:  [id number]  DATE OF BIRTH:  01/04/1966  SUBJECTIVE:  CHIEF COMPLAINT:   Chief Complaint  Patient presents with  . Shortness of Breath  Continues to complain of abdominal pain, gastroenterology input appreciated, abnormal repeat CT scan of the abdomen noted with possibility of acute pancreatitis.  LFTs are elevated today. REVIEW OF SYSTEMS:  CONSTITUTIONAL: No fever, fatigue or weakness.  EYES: No blurred or double vision.  EARS, NOSE, AND THROAT: No tinnitus or ear pain.  RESPIRATORY: No cough, shortness of breath, wheezing or hemoptysis.  CARDIOVASCULAR: No chest pain, orthopnea, edema.  GASTROINTESTINAL: No nausea, vomiting, diarrhea , have abdominal pain.  GENITOURINARY: No dysuria, hematuria.  ENDOCRINE: No polyuria, nocturia,  HEMATOLOGY: No anemia, easy bruising or bleeding SKIN: No rash or lesion. MUSCULOSKELETAL: No joint pain or arthritis.   NEUROLOGIC: No tingling, numbness, weakness.  PSYCHIATRY: No anxiety or depression.   ROS  DRUG ALLERGIES:   Allergies  Allergen Reactions  . Levothyroxine Rash    VITALS:  Blood pressure 116/82, pulse 85, temperature 98.8 F (37.1 C), temperature source Oral, resp. rate 20, height 5\' 4"  (1.626 m), weight 92.9 kg, SpO2 99 %.  PHYSICAL EXAMINATION:  GENERAL:  52 y.o.-year-old patient lying in the bed with no acute distress.  EYES: Pupils equal, round, reactive to light and accommodation. No scleral icterus. Extraocular muscles intact.  HEENT: Head atraumatic, normocephalic. Oropharynx and nasopharynx clear.  NECK:  Supple, no jugular venous distention. No thyroid enlargement, no tenderness.  LUNGS: Normal breath sounds bilaterally, no wheezing, rales,rhonchi or crepitation. No use of accessory muscles of respiration.  CARDIOVASCULAR: S1, S2 normal. No murmurs, rubs, or gallops.  ABDOMEN: Soft, epigastric tender, nondistended. Bowel  sounds present. No organomegaly or mass.  EXTREMITIES: No pedal edema, cyanosis, or clubbing.  NEUROLOGIC: Cranial nerves II through XII are intact. Muscle strength 5/5 in all extremities. Sensation intact. Gait not checked.  PSYCHIATRIC: The patient is alert and oriented x 3.  SKIN: No obvious rash, lesion, or ulcer.   Physical Exam LABORATORY PANEL:   CBC Recent Labs  Lab 05/29/18 0526  WBC 6.8  HGB 8.5*  HCT 27.8*  PLT 198   ------------------------------------------------------------------------------------------------------------------  Chemistries  Recent Labs  Lab 05/27/18 0446  05/29/18 0526  NA 140   < > 138  K 3.3*   < > 3.5  CL 108   < > 105  CO2 24   < > 22  GLUCOSE 131*   < > 92  BUN 12   < > 13  CREATININE 1.03*   < > 0.94  CALCIUM 8.6*   < > 8.6*  MG 1.8  --   --   AST 37  --  432*  ALT 44  --  545*  ALKPHOS 101  --  125  BILITOT 2.2*  --  2.5*   < > = values in this interval not displayed.   ------------------------------------------------------------------------------------------------------------------  Cardiac Enzymes Recent Labs  Lab 05/25/18 2109 05/26/18 0717  TROPONINI 0.05* 0.04*   ------------------------------------------------------------------------------------------------------------------  RADIOLOGY:  Ct Abdomen Pelvis W Contrast  Result Date: 05/28/2018 CLINICAL DATA:  Cirrhosis of the liver, heart failure some abdominal discomfort primarily right-sided, nausea EXAM: CT ABDOMEN AND PELVIS WITH CONTRAST TECHNIQUE: Multidetector CT imaging of the abdomen and pelvis was performed using the standard protocol following bolus administration of intravenous contrast. CONTRAST:  ISOVUE-300 IOPAMIDOL (ISOVUE-300) INJECTION 61% COMPARISON:  CT  abdomen pelvis of 02/09/2017 FINDINGS: Lower chest: There is linear atelectasis or scarring in the anterior left lower lobe. No pneumonia or effusion is evident. There is moderate cardiomegaly  noted. No pericardial effusion is seen. Hepatobiliary: The liver is somewhat inhomogeneous with slightly nodular contours consistent with diagnosis of cirrhosis. No focal hepatic abnormality is seen. However, the gallbladder appears to have a somewhat thickened wall. No definite calcified gallstones are seen but either ultrasound of the right upper quadrant or nuclear medicine hepatobiliary scan would be recommended to evaluate for possible cholecystitis. Prominent gallbladder wall could also be seen in hypoproteinemia. Pancreas: The head of the pancreas is very indistinct and there is some peripancreatic fluid extending into the anterior pararenal space. Acute pancreatitis of the pancreatic head is a definite consideration. The body and tail of the pancreas is unremarkable and the pancreatic duct is not dilated. Correlate with appropriate laboratory values. Spleen: The spleen is unremarkable.  A splenule is noted medially Adrenals/Urinary Tract: The adrenal glands appear normal. The kidneys enhance with no calculus or mass. On delayed images, the pelvocaliceal systems are unremarkable. The ureters appear normal caliber. The urinary bladder is not well distended but no abnormality is seen. Stomach/Bowel: The stomach is moderately distended with food debris and oral contrast media with no definite abnormality. There is some indistinctness of the descending duodenum which most likely is related to possible inflammatory process as described above involving the head of the pancreas. Stepped contrast is scattered throughout small bowel which is nondistended. There are a few rectosigmoid diverticula present. There is a moderate amount of feces throughout the colon. Terminal ileum and the appendix are unremarkable. Vascular/Lymphatic: The abdominal aorta is normal in caliber with mild to moderate abdominal aortic atherosclerosis present. Only a few small retroperitoneal nodes are present. Reproductive: The uterus has  previously been resected. No adnexal lesion is seen. No fluid is noted within the pelvis. Other: No abdominal wall hernia is seen. Musculoskeletal: The lumbar vertebrae are in normal alignment. Degenerative disc disease is present primarily at L4-5. IMPRESSION: 1. Indistinctness around the head of the pancreas with some fluid in the anterior pararenal space may indicate acute pancreatitis involving the head of the pancreas. Correlate with appropriate laboratory values. 2. Changes of hepatic cirrhosis.  No focal hepatic abnormality. 3. Somewhat thickened gallbladder wall most likely due to hypoproteinemia but developing acute cholecystitis cannot be excluded. Correlate clinically. 4. Moderate abdominal aortic atherosclerosis. Electronically Signed   By: Dwyane Dee M.D.   On: 05/28/2018 13:56    ASSESSMENT AND PLAN:  *Acute recurrent right upper quadrant pain Etiology unknown- ? Due to chronic liver disease HIDA scan was a negative study, gastroenterology input appreciated, will ask general surgery to reevaluate given recurrent abdominal pain-suggested no interventions still/abnormal CT findings of the abdomen per below. Repeat CT of the abdomen: 1. Indistinctness around the head of the pancreas with some fluid in the anterior pararenal space may indicate acute pancreatitis involving the head of the pancreas. Correlate with appropriate laboratory values. Check lipase and that is not elevated.  Keep on clear liquid diet for now. 2. Changes of hepatic cirrhosis.  No focal hepatic abnormality. 3. Somewhat thickened gallbladder wall most likely due to hypoproteinemia but developing acute cholecystitis cannot be excluded. Correlate clinically. 4. Moderate abdominal aortic atherosclerosis.  *Acute elevated troponins Inconsistent with acute coronary syndrome Cardiac enzymes negative for acute coronary syndrome  *HFrEF secondary to NICM status post ICD placement, EF 20% Stable on current  regiment  *CKD 3  Stable  *Chronic diabetes mellitus type 2  Controlled on current regiment  *Chronic alcoholic liver cirrhosis  Stable  Need to continue to follow-up with gastroenterology status post discharge  for continued care/medical monitoring  All the records are reviewed and case discussed with Care Management/Social Workerr. Management plans discussed with the patient, family and they are in agreement.  CODE STATUS: full  TOTAL TIME TAKING CARE OF THIS PATIENT: 40 minutes.    POSSIBLE D/C IN 1-2 DAYS, DEPENDING ON CLINICAL CONDITION.   Altamese Dilling M.D on 05/29/2018   Between 7am to 6pm - Pager - [phone]  After 6pm go to [email or web address] - password [email or web address]  [facility]  Office  [phone]  CC: Primary care physician; Dorcas Carrow, DO  Note: This dictation was prepared with Dragon dictation along with smaller phrase technology. Any transcriptional errors that result from this process are unintentional.

## 2018-05-29 NOTE — Care Management Important Message (Signed)
Copy of signed IM left with patient in room.  

## 2018-05-30 DIAGNOSIS — R748 Abnormal levels of other serum enzymes: Secondary | ICD-10-CM

## 2018-05-30 LAB — COMPREHENSIVE METABOLIC PANEL
ALK PHOS: 124 U/L (ref 38–126)
ALT: 719 U/L — ABNORMAL HIGH (ref 0–44)
ANION GAP: 8 (ref 5–15)
AST: 427 U/L — ABNORMAL HIGH (ref 15–41)
Albumin: 3.5 g/dL (ref 3.5–5.0)
BILIRUBIN TOTAL: 2.5 mg/dL — AB (ref 0.3–1.2)
BUN: 14 mg/dL (ref 6–20)
CALCIUM: 8.4 mg/dL — AB (ref 8.9–10.3)
CO2: 23 mmol/L (ref 22–32)
Chloride: 108 mmol/L (ref 98–111)
Creatinine, Ser: 1.04 mg/dL — ABNORMAL HIGH (ref 0.44–1.00)
GFR, EST NON AFRICAN AMERICAN: 60 mL/min — AB (ref >60)
Glucose, Bld: 110 mg/dL — ABNORMAL HIGH (ref 70–99)
Potassium: 3.7 mmol/L (ref 3.5–5.1)
Sodium: 139 mmol/L (ref 135–145)
TOTAL PROTEIN: 7.6 g/dL (ref 6.5–8.1)

## 2018-05-30 LAB — CULTURE, BLOOD (ROUTINE X 2)
Culture: NO GROWTH
Culture: NO GROWTH
Special Requests: ADEQUATE
Special Requests: ADEQUATE

## 2018-05-30 LAB — CBC
HEMATOCRIT: 27 % — AB (ref 36.0–46.0)
Hemoglobin: 8.4 g/dL — ABNORMAL LOW (ref 12.0–15.0)
MCH: 31 pg (ref 26.0–34.0)
MCHC: 31.1 g/dL (ref 30.0–36.0)
MCV: 99.6 fL (ref 80.0–100.0)
NRBC: 0.8 % — AB (ref 0.0–0.2)
PLATELETS: 191 10*3/uL (ref 150–400)
RBC: 2.71 MIL/uL — AB (ref 3.87–5.11)
RDW: 16.3 % — ABNORMAL HIGH (ref 11.5–15.5)
WBC: 7.3 10*3/uL (ref 4.0–10.5)

## 2018-05-30 LAB — IRON AND TIBC
Iron: 32 ug/dL (ref 28–170)
SATURATION RATIOS: 12 % (ref 10.4–31.8)
TIBC: 263 ug/dL (ref 250–450)
UIBC: 231 ug/dL

## 2018-05-30 LAB — FERRITIN: Ferritin: 302 ng/mL (ref 11–307)

## 2018-05-30 LAB — GLUCOSE, CAPILLARY
GLUCOSE-CAPILLARY: 165 mg/dL — AB (ref 70–99)
Glucose-Capillary: 144 mg/dL — ABNORMAL HIGH (ref 70–99)
Glucose-Capillary: 128 mg/dL — ABNORMAL HIGH (ref 70–99)
Glucose-Capillary: 163 mg/dL — ABNORMAL HIGH (ref 70–99)

## 2018-05-30 LAB — PROTIME-INR
INR: 1.25
Prothrombin Time: 15.6 seconds — ABNORMAL HIGH (ref 11.4–15.2)

## 2018-05-30 LAB — LIPASE, BLOOD: Lipase: 17 U/L (ref 11–51)

## 2018-05-30 MED ORDER — QUETIAPINE FUMARATE 200 MG PO TABS
200.0000 mg | ORAL_TABLET | Freq: Every day | ORAL | Status: DC
  Administered 2018-05-30 – 2018-05-31 (×2): 200 mg via ORAL
  Filled 2018-05-30 (×3): qty 1

## 2018-05-30 MED ORDER — PAROXETINE HCL 20 MG PO TABS
20.0000 mg | ORAL_TABLET | Freq: Every day | ORAL | Status: DC
  Administered 2018-05-31 – 2018-06-01 (×2): 20 mg via ORAL
  Filled 2018-05-30 (×2): qty 1

## 2018-05-30 NOTE — Progress Notes (Signed)
Midge Minium, MD [facility]   [address] [place], [place] [address] Phone: [phone] Fax : [phone]   Subjective: This patient was admitted with abdominal pain and thought to have cholecystitis.  The patient's HIDA scan did not show any sign of cholecystitis.  The patient continues to have lower abdominal pain.  She was found to have findings consistent with cirrhosis and a CT scan showing fluid around the pancreas although her lipase is normal   Objective: Vital signs in last 24 hours: Vitals:   05/30/18 0801 05/30/18 1133 05/30/18 1729 05/30/18 2123  BP: 130/85 95/68 122/84 111/87  Pulse: 92 77 85 80  Resp: 18   20  Temp: 98.8 F (37.1 C) 98 F (36.7 C)  98.4 F (36.9 C)  TempSrc: Oral Oral  Oral  SpO2: 99% 98%  100%  Weight:      Height:       Weight change: -0.751 kg  Intake/Output Summary (Last 24 hours) at 05/30/2018 2134 Last data filed at 05/30/2018 1846 Gross per 24 hour  Intake 480 ml  Output 300 ml  Net 180 ml     Exam: Heart:: Regular rate and rhythm, S1S2 present or without murmur or extra heart sounds Lungs: normal and clear to auscultation and percussion Abdomen: Abdomen soft with tenderness in the lower abdomen without rebound or guarding   Lab Results: @LABTEST2 @ Micro Results: Recent Results (from the past 240 hour(s))  Blood culture (routine x 2)     Status: None   Collection Time: 05/25/18 10:32 PM  Result Value Ref Range Status   Specimen Description BLOOD LEFT ANTECUBITAL  Final   Special Requests   Final    BOTTLES DRAWN AEROBIC AND ANAEROBIC Blood Culture adequate volume   Culture   Final    NO GROWTH 5 DAYS Performed at [facility], [address] [place], [place] [address]    Report Status 05/30/2018 FINAL  Final  Blood culture (routine x 2)     Status: None   Collection Time: 05/25/18 10:32 PM  Result Value Ref Range Status   Specimen Description BLOOD LEFT HAND  Final   Special Requests   Final   BOTTLES DRAWN AEROBIC AND ANAEROBIC Blood Culture adequate volume   Culture   Final    NO GROWTH 5 DAYS Performed at [facility], [address], [place], [place] [address]    Report Status 05/30/2018 FINAL  Final   Studies/Results: No results found. Medications: I have reviewed the patient's current medications. Scheduled Meds: . carvedilol  12.5 mg Oral BID WC  . dicyclomine  20 mg Oral TID AC & HS  . docusate sodium  100 mg Oral BID  . heparin  5,000 Units Subcutaneous Q8H  . insulin aspart  0-15 Units Subcutaneous TID WC  . insulin aspart  0-5 Units Subcutaneous QHS  . lamoTRIgine  25 mg Oral BID  . lipase/protease/amylase  24,000 Units Oral TID WC  . pantoprazole  40 mg Oral Daily  . [START ON 05/31/2018] PARoxetine  20 mg Oral Daily  . QUEtiapine  200 mg Oral QHS  . sodium chloride flush  3 mL Intravenous Q12H   Continuous Infusions: . sodium chloride Stopped (05/26/18 1412)   PRN Meds:.sodium chloride, bisacodyl, hydrALAZINE, morphine injection, ondansetron **OR** ondansetron (ZOFRAN) IV, sodium chloride flush, traMADol, traZODone   Assessment: Active Problems:   Acute cholecystitis   Abdominal pain, right upper quadrant   Hyperbilirubinemia    Plan: This patient has  abdominal pain also on the right in the lower abdomen.  The patient's liver enzymes have been creeping up with the patient's AST being normal 3 days ago and is now 427 and the ALT which was normal 3 days ago is now 719.  The patient's blood work has been sent off for possible causes of her abnormal liver enzymes.  The patient denies any Hepatotoxins.   LOS: 4 days   Midge Minium 05/30/2018, 9:34 PM

## 2018-05-30 NOTE — Progress Notes (Signed)
Pharmacy Electrolyte Monitoring Consult:  Pharmacy consulted to assist in monitoring and replacing electrolytes (potassium) in this 52 y.o. female admitted on 05/25/2018 with Shortness of Breath   Labs:  Sodium (mmol/L)  Date Value  05/30/2018 139  05/01/2018 142   Potassium (mmol/L)  Date Value  05/30/2018 3.7   Magnesium (mg/dL)  Date Value  67/59/1638 1.8   Phosphorus (mg/dL)  Date Value  46/65/9935 3.0   Calcium (mg/dL)  Date Value  70/17/7939 8.4 (L)   Albumin (g/dL)  Date Value  03/00/9233 3.5  04/07/2018 4.8    Assessment/Plan:  K wnl  potassium 10/15 3.3 10/16 3.7 10/17 3.5 10/18 3.7  No replenishment needed at this time Pharmacy signing off on consult at this time  Albina Billet, PharmD Clinical Pharmacist 05/30/2018 8:18 AM

## 2018-05-30 NOTE — Progress Notes (Signed)
[employer] -  at [facility]   PATIENT NAME: Bonnie Ochoa    MR#:  [id number]  DATE OF BIRTH:  1965-08-19  SUBJECTIVE:  CHIEF COMPLAINT:   Chief Complaint  Patient presents with  . Shortness of Breath  Continues to complain of abdominal pain, gastroenterology input appreciated, abnormal repeat CT scan of the abdomen noted with possibility of acute pancreatitis.  LFTs are elevated today. REVIEW OF SYSTEMS:  CONSTITUTIONAL: No fever, fatigue or weakness.  EYES: No blurred or double vision.  EARS, NOSE, AND THROAT: No tinnitus or ear pain.  RESPIRATORY: No cough, shortness of breath, wheezing or hemoptysis.  CARDIOVASCULAR: No chest pain, orthopnea, edema.  GASTROINTESTINAL: No nausea, vomiting, diarrhea , have abdominal pain.  GENITOURINARY: No dysuria, hematuria.  ENDOCRINE: No polyuria, nocturia,  HEMATOLOGY: No anemia, easy bruising or bleeding SKIN: No rash or lesion. MUSCULOSKELETAL: No joint pain or arthritis.   NEUROLOGIC: No tingling, numbness, weakness.  PSYCHIATRY: No anxiety or depression.   ROS  DRUG ALLERGIES:   Allergies  Allergen Reactions  . Levothyroxine Rash    VITALS:  Blood pressure 122/84, pulse 85, temperature 98 F (36.7 C), temperature source Oral, resp. rate 18, height 5\' 4"  (1.626 m), weight 92.1 kg, SpO2 98 %.  PHYSICAL EXAMINATION:  GENERAL:  52 y.o.-year-old patient lying in the bed with no acute distress.  EYES: Pupils equal, round, reactive to light and accommodation. No scleral icterus. Extraocular muscles intact.  HEENT: Head atraumatic, normocephalic. Oropharynx and nasopharynx clear.  NECK:  Supple, no jugular venous distention. No thyroid enlargement, no tenderness.  LUNGS: Normal breath sounds bilaterally, no wheezing, rales,rhonchi or crepitation. No use of accessory muscles of respiration.  CARDIOVASCULAR: S1, S2 normal. No murmurs, rubs, or gallops.  ABDOMEN: Soft, epigastric tender, nondistended. Bowel  sounds present. No organomegaly or mass.  EXTREMITIES: No pedal edema, cyanosis, or clubbing.  NEUROLOGIC: Cranial nerves II through XII are intact. Muscle strength 5/5 in all extremities. Sensation intact. Gait not checked.  PSYCHIATRIC: The patient is alert and oriented x 3.  SKIN: No obvious rash, lesion, or ulcer.   Physical Exam LABORATORY PANEL:   CBC Recent Labs  Lab 05/30/18 0358  WBC 7.3  HGB 8.4*  HCT 27.0*  PLT 191   ------------------------------------------------------------------------------------------------------------------  Chemistries  Recent Labs  Lab 05/27/18 0446  05/30/18 0358  NA 140   < > 139  K 3.3*   < > 3.7  CL 108   < > 108  CO2 24   < > 23  GLUCOSE 131*   < > 110*  BUN 12   < > 14  CREATININE 1.03*   < > 1.04*  CALCIUM 8.6*   < > 8.4*  MG 1.8  --   --   AST 37   < > 427*  ALT 44   < > 719*  ALKPHOS 101   < > 124  BILITOT 2.2*   < > 2.5*   < > = values in this interval not displayed.   ------------------------------------------------------------------------------------------------------------------  Cardiac Enzymes Recent Labs  Lab 05/25/18 2109 05/26/18 0717  TROPONINI 0.05* 0.04*   ------------------------------------------------------------------------------------------------------------------  RADIOLOGY:  No results found.  ASSESSMENT AND PLAN:  *Acute recurrent right upper quadrant pain Etiology unknown- ? Due to chronic liver disease HIDA scan was a negative study, gastroenterology input appreciated, will ask general surgery to reevaluate given recurrent abdominal pain-suggested no interventions still/abnormal CT findings of the abdomen  Shows Possible pancreatitis, Liver cirrhosis.  *Acute elevated troponins  Inconsistent with acute coronary syndrome Cardiac enzymes negative for acute coronary syndrome  *HFrEF secondary to NICM status post ICD placement, EF 20% Stable on current regiment  * Elevated Liver  enzymes   Seen by GI- suspect hepato cellular damage, work up sent.   If symptomatically improves, may follow In clinic.    *CKD 3 Stable  *Chronic diabetes mellitus type 2  Controlled on current regiment  *Chronic alcoholic liver cirrhosis  Stable   All the records are reviewed and case discussed with Care Management/Social Workerr. Management plans discussed with the patient, family and they are in agreement.  CODE STATUS: full  TOTAL TIME TAKING CARE OF THIS PATIENT: 40 minutes.    POSSIBLE D/C IN 1-2 DAYS, DEPENDING ON CLINICAL CONDITION.   Altamese Dilling M.D on 05/30/2018   Between 7am to 6pm - Pager - [phone]  After 6pm go to [email or web address] - password [email or web address]  [facility] [place] [facility]  Office  [phone]  CC: Primary care physician; Dorcas Carrow, DO  Note: This dictation was prepared with Dragon dictation along with smaller phrase technology. Any transcriptional errors that result from this process are unintentional.

## 2018-05-31 ENCOUNTER — Inpatient Hospital Stay: Payer: Medicare Other

## 2018-05-31 DIAGNOSIS — R748 Abnormal levels of other serum enzymes: Secondary | ICD-10-CM

## 2018-05-31 LAB — MITOCHONDRIAL ANTIBODIES

## 2018-05-31 LAB — COMPREHENSIVE METABOLIC PANEL
ALBUMIN: 3.5 g/dL (ref 3.5–5.0)
ALK PHOS: 122 U/L (ref 38–126)
ALT: 720 U/L — AB (ref 0–44)
ANION GAP: 10 (ref 5–15)
AST: 340 U/L — ABNORMAL HIGH (ref 15–41)
BUN: 13 mg/dL (ref 6–20)
CALCIUM: 8.5 mg/dL — AB (ref 8.9–10.3)
CHLORIDE: 108 mmol/L (ref 98–111)
CO2: 22 mmol/L (ref 22–32)
CREATININE: 1.07 mg/dL — AB (ref 0.44–1.00)
GFR calc Af Amer: >60 mL/min (ref >60)
GFR calc non Af Amer: 58 mL/min — ABNORMAL LOW (ref >60)
GLUCOSE: 123 mg/dL — AB (ref 70–99)
Potassium: 3.8 mmol/L (ref 3.5–5.1)
Sodium: 140 mmol/L (ref 135–145)
Total Bilirubin: 2.7 mg/dL — ABNORMAL HIGH (ref 0.3–1.2)
Total Protein: 7.2 g/dL (ref 6.5–8.1)

## 2018-05-31 LAB — GLUCOSE, CAPILLARY
GLUCOSE-CAPILLARY: 135 mg/dL — AB (ref 70–99)
GLUCOSE-CAPILLARY: 181 mg/dL — AB (ref 70–99)
GLUCOSE-CAPILLARY: 161 mg/dL — AB (ref 70–99)
Glucose-Capillary: 211 mg/dL — ABNORMAL HIGH (ref 70–99)

## 2018-05-31 LAB — IGG, IGA, IGM
IGG (IMMUNOGLOBIN G), SERUM: 1656 mg/dL — AB (ref 700–1600)
IGM (IMMUNOGLOBULIN M), SRM: 84 mg/dL (ref 26–217)
IgA: 328 mg/dL (ref 87–352)

## 2018-05-31 LAB — HEPATITIS PANEL, ACUTE
Hep A IgM: NEGATIVE
Hep B C IgM: NEGATIVE
Hepatitis B Surface Ag: NEGATIVE

## 2018-05-31 LAB — BILIRUBIN, DIRECT: Bilirubin, Direct: 0.6 mg/dL — ABNORMAL HIGH (ref 0.0–0.2)

## 2018-05-31 LAB — CERULOPLASMIN: Ceruloplasmin: 48.6 mg/dL — ABNORMAL HIGH (ref 19.0–39.0)

## 2018-05-31 LAB — ANTI-SMOOTH MUSCLE ANTIBODY, IGG: F-Actin IgG: 9 Units (ref 0–19)

## 2018-05-31 LAB — AFP TUMOR MARKER: AFP, SERUM, TUMOR MARKER: 2.4 ng/mL (ref 0.0–8.3)

## 2018-05-31 MED ORDER — FUROSEMIDE 10 MG/ML IJ SOLN
40.0000 mg | Freq: Once | INTRAMUSCULAR | Status: AC
  Administered 2018-05-31: 40 mg via INTRAVENOUS
  Filled 2018-05-31: qty 4

## 2018-05-31 NOTE — Progress Notes (Addendum)
  , MD [address], [address], [place], [place], [address] [address], [address], [place], [place], [address] Phone: [phone]  Fax: [phone]   Subjective:  Patient reports continued abdominal pain, right upper quadrant but no midepigastric, and improved from before but still persistent.  No nausea or vomiting.  Has been tolerating clear liquid diet and soft diet has been ordered for today.  Objective: Exam: Vital signs in last 24 hours: Vitals:   05/30/18 1133 05/30/18 1729 05/30/18 2123 05/31/18 0559  BP: 95/68 122/84 111/87 106/72  Pulse: 77 85 80 83  Resp:   20 (!) 22  Temp: 98 F (36.7 C)  98.4 F (36.9 C) 98.2 F (36.8 C)  TempSrc: Oral  Oral Oral  SpO2: 98%  100% 99%  Weight:      Height:       Weight change:   Intake/Output Summary (Last 24 hours) at 05/31/2018 0904 Last data filed at 05/31/2018 0602 Gross per 24 hour  Intake 480 ml  Output 350 ml  Net 130 ml    General: No acute distress, AAO x3 Abd: Soft, NT/ND, No HSM Skin: Warm, no rashes Neck: Supple, Trachea midline   Lab Results: Lab Results  Component Value Date   WBC 7.3 05/30/2018   HGB 8.4 (L) 05/30/2018   HCT 27.0 (L) 05/30/2018   MCV 99.6 05/30/2018   PLT 191 05/30/2018   Micro Results: Recent Results (from the past 240 hour(s))  Blood culture (routine x 2)     Status: None   Collection Time: 05/25/18 10:32 PM  Result Value Ref Range Status   Specimen Description BLOOD LEFT ANTECUBITAL  Final   Special Requests   Final    BOTTLES DRAWN AEROBIC AND ANAEROBIC Blood Culture adequate volume   Culture   Final    NO GROWTH 5 DAYS Performed at [facility], [address] , [place] [address]    Report Status 05/30/2018 FINAL  Final  Blood culture (routine x 2)     Status: None   Collection Time: 05/25/18 10:32 PM  Result Value Ref Range Status   Specimen Description BLOOD LEFT HAND  Final   Special Requests   Final    BOTTLES DRAWN  AEROBIC AND ANAEROBIC Blood Culture adequate volume   Culture   Final    NO GROWTH 5 DAYS Performed at [facility], [address] , [place] [address]    Report Status 05/30/2018 FINAL  Final   Studies/Results: Dg Chest 2 View  Result Date: 05/31/2018 CLINICAL DATA:  Right side abdominal pain radiating into the chest. EXAM: CHEST - 2 VIEW COMPARISON:  Single-view of the chest 05/25/2018. PA and lateral chest 05/19/2018 and 07/02/2017. FINDINGS: AICD is in place. Tip of the lead is in the right ventricle. Cardiomegaly without edema is seen. Small focus of linear atelectasis is noted in the lingula. No pneumothorax or pleural effusion. IMPRESSION: Cardiomegaly without pulmonary edema. Linear subsegmental atelectasis in the lingula. Electronically Signed   By: Thomas  Dalessio M.D.   On: 05/31/2018 08:39   Medications:  Scheduled Meds: . carvedilol  12.5 mg Oral BID WC  . dicyclomine  20 mg Oral TID AC & HS  . docusate sodium  100 mg Oral BID  . heparin  5,000 Units Subcutaneous Q8H  . insulin aspart  0-15 Units Subcutaneous TID WC  . insulin aspart  0-5 Units Subcutaneous QHS  . lamoTRIgine  25 mg Oral BID  . lipase/protease/amylase    24,000 Units Oral TID WC  . pantoprazole  40 mg Oral Daily  . PARoxetine  20 mg Oral Daily  . QUEtiapine  200 mg Oral QHS  . sodium chloride flush  3 mL Intravenous Q12H   Continuous Infusions: . sodium chloride Stopped (05/26/18 1412)   PRN Meds:.sodium chloride, bisacodyl, hydrALAZINE, morphine injection, ondansetron **OR** ondansetron (ZOFRAN) IV, sodium chloride flush, traMADol, traZODone   Assessment: Active Problems:   Acute cholecystitis   Abdominal pain, right upper quadrant   Hyperbilirubinemia   Abnormal liver enzymes    Plan: Patient is right upper quadrant pain and elevated liver enzymes Total bilirubin is elevated 2.7, and was elevated to 2.2 on recent admission on May 27, 2018.  It was elevated to 1.5 in  July 2019 in the setting of no abdominal pain  Her alk phos is normal, and direct bilirubin today is only 0.6, which makes indirect bilirubin higher, which would go against a biliary duct obstruction  She has a history of cirrhosis attributed to right heart failure, with EF of 25% and patient is on AICD BNP ordered by Dr. Anna on this admission is acutely elevated to 1003 Acute heart failure, as suggested by increased BNP can lead to elevated liver enzymes. Would recommend cardiology consult  Liver work-up so far has included acute hepatitis panel which was negative, ferritin was normal ceruloplasmin is elevated which would go against Wilson's disease, anti-smooth muscle antibody, antimitochondrial antibody are pending and I do not anticipate the dose to be a cause of her elevated liver enzymes, as these were recently done in August 2019 and were normal.  Her ultrasound on October 13 showed gallbladder wall thickening and edema, and a calculus cholecystitis is on the differential.  However, HIDA scan was negative.  CT abdomen showed indistinctness around the head of the pancreas, but lipase was normal.  CT also reported thickened wall of the gallbladder.  Given gallbladder wall thickening reported on two separate imaging test, ultrasound, and CT done 3 days apart, would recommend surgery consult and MRCP today as it would allow for evaluation of her pancreas, gallbladder, bile ducts more closely.  Avoid hepatotoxic drugs Monitor CMP daily   LOS: 5 days    , MD 05/31/2018, 9:04 AM  

## 2018-05-31 NOTE — Progress Notes (Signed)
[employer] [facility] [place] [facility]      PATIENT NAME: Bonnie Ochoa    MR#:  [id number]  DATE OF BIRTH:  1966/08/02  SUBJECTIVE:   Patient still complaining of some right upper quadrant abdominal pain.  LFTs have improved.  Tolerating soft diet well.  Seen by gastroenterology and the recommend MRCP but that cannot be done as patient has a defibrillator/pacemaker.  REVIEW OF SYSTEMS:    Review of Systems  Constitutional: Negative for chills and fever.  HENT: Negative for congestion and tinnitus.   Eyes: Negative for blurred vision and double vision.  Respiratory: Negative for cough, shortness of breath and wheezing.   Cardiovascular: Negative for chest pain, orthopnea and PND.  Gastrointestinal: Positive for abdominal pain. Negative for diarrhea, nausea and vomiting.  Genitourinary: Negative for dysuria and hematuria.  Neurological: Negative for dizziness, sensory change and focal weakness.  All other systems reviewed and are negative.   Nutrition: Soft diet Tolerating Diet: Yes Tolerating PT: Ambulatory  DRUG ALLERGIES:   Allergies  Allergen Reactions  . Levothyroxine Rash    VITALS:  Blood pressure 109/83, pulse 73, temperature 97.6 F (36.4 C), temperature source Oral, resp. rate (!) 22, height 5\' 4"  (1.626 m), weight 92.1 kg, SpO2 100 %.  PHYSICAL EXAMINATION:   Physical Exam  GENERAL:  52 y.o.-year-old patient lying in bed in no acute distress.  EYES: Pupils equal, round, reactive to light and accommodation. No scleral icterus. Extraocular muscles intact.  HEENT: Head atraumatic, normocephalic. Oropharynx and nasopharynx clear.  NECK:  Supple, no jugular venous distention. No thyroid enlargement, no tenderness.  LUNGS: Normal breath sounds bilaterally, no wheezing, rales, rhonchi. No use of accessory muscles of respiration.  CARDIOVASCULAR: S1, S2 normal. No murmurs, rubs, or gallops.  ABDOMEN: Soft, Tender in the RUQ, nondistended. Bowel  sounds present. No organomegaly or mass.  EXTREMITIES: No cyanosis, clubbing or edema b/l.    NEUROLOGIC: Cranial nerves II through XII are intact. No focal Motor or sensory deficits b/l.   PSYCHIATRIC: The patient is alert and oriented x 3.  SKIN: No obvious rash, lesion, or ulcer.    LABORATORY PANEL:   CBC Recent Labs  Lab 05/30/18 0358  WBC 7.3  HGB 8.4*  HCT 27.0*  PLT 191   ------------------------------------------------------------------------------------------------------------------  Chemistries  Recent Labs  Lab 05/27/18 0446  05/31/18 0454  NA 140   < > 140  K 3.3*   < > 3.8  CL 108   < > 108  CO2 24   < > 22  GLUCOSE 131*   < > 123*  BUN 12   < > 13  CREATININE 1.03*   < > 1.07*  CALCIUM 8.6*   < > 8.5*  MG 1.8  --   --   AST 37   < > 340*  ALT 44   < > 720*  ALKPHOS 101   < > 122  BILITOT 2.2*   < > 2.7*   < > = values in this interval not displayed.   ------------------------------------------------------------------------------------------------------------------  Cardiac Enzymes Recent Labs  Lab 05/26/18 0717  TROPONINI 0.04*   ------------------------------------------------------------------------------------------------------------------  RADIOLOGY:  Dg Chest 2 View  Result Date: 05/31/2018 CLINICAL DATA:  Right side abdominal pain radiating into the chest. EXAM: CHEST - 2 VIEW COMPARISON:  Single-view of the chest 05/25/2018. PA and lateral chest 05/19/2018 and 07/02/2017. FINDINGS: AICD is in place. Tip of the lead is in the right ventricle. Cardiomegaly without edema is seen. Small  focus of linear atelectasis is noted in the lingula. No pneumothorax or pleural effusion. IMPRESSION: Cardiomegaly without pulmonary edema. Linear subsegmental atelectasis in the lingula. Electronically Signed   By: Drusilla Kanner M.D.   On: 05/31/2018 08:39     ASSESSMENT AND PLAN:   52 year old female with past medical history of chronic systolic CHF,  severe cardiomyopathy EF of 20 to 25%, PTSD, diabetes, hypertension, history of coronary artery disease IBS who presents to the hospital due to abdominal pain.  1.  Right upper quadrant abdominal pain/Abnormal LFT's-etiology unclear presently.  Patient does have abnormal LFTs but source remains unclear presently.  Patient had a right upper quadrant ultrasound which was nondiagnostic, underwent a HIDA scan which was negative for acute cholecystitis or any gallstones. - Seen by gastroenterology and serologic work-up for acute hepatitis has been initiated. - No plans for surgical intervention.  Follow LFTs, continue supportive care for now.  No clinical evidence of pancreatitis.  2.  Chronic systolic CHF-clinically patient is not in congestive heart failure. - Continue carvedilol, questionable LFTs abnormal secondary to chronic passive congestion from CHF-we will give 1 dose of IV Lasix and follow response, follow LFTs in a.m.  3.  Diabetes type 2 without complication-continue sliding scale insulin.  4.  History of IBS-continue Bentyl.  5.  Anxiety/PTSD-continue Paxil.  6.  GERD-continue Protonix.  Discussed plan of care with Pt's family at bedside and also GI.   All the records are reviewed and case discussed with Care Management/Social Worker. Management plans discussed with the patient, family and they are in agreement.  CODE STATUS: Full code  DVT Prophylaxis: Hep SQ  TOTAL TIME TAKING CARE OF THIS PATIENT: 35 minutes.   POSSIBLE D/C IN 1-2 DAYS, DEPENDING ON CLINICAL CONDITION.   Houston Siren M.D on 05/31/2018 at 2:51 PM  Between 7am to 6pm - Pager - [phone]  After 6pm go to [email or web address] - Scientist, research (life sciences) [facility]  Office  [phone]  CC: Primary care physician; Dorcas Carrow, DO

## 2018-06-01 LAB — COMPREHENSIVE METABOLIC PANEL
ALT: 541 U/L — ABNORMAL HIGH (ref 0–44)
ANION GAP: 8 (ref 5–15)
AST: 108 U/L — ABNORMAL HIGH (ref 15–41)
Albumin: 3.3 g/dL — ABNORMAL LOW (ref 3.5–5.0)
Alkaline Phosphatase: 114 U/L (ref 38–126)
BILIRUBIN TOTAL: 1.8 mg/dL — AB (ref 0.3–1.2)
BUN: 13 mg/dL (ref 6–20)
CALCIUM: 8.3 mg/dL — AB (ref 8.9–10.3)
CO2: 27 mmol/L (ref 22–32)
Chloride: 105 mmol/L (ref 98–111)
Creatinine, Ser: 1.14 mg/dL — ABNORMAL HIGH (ref 0.44–1.00)
GFR, EST NON AFRICAN AMERICAN: 54 mL/min — AB (ref >60)
GLUCOSE: 143 mg/dL — AB (ref 70–99)
POTASSIUM: 3.3 mmol/L — AB (ref 3.5–5.1)
SODIUM: 140 mmol/L (ref 135–145)
Total Protein: 7.4 g/dL (ref 6.5–8.1)

## 2018-06-01 LAB — GLUCOSE, CAPILLARY
GLUCOSE-CAPILLARY: 205 mg/dL — AB (ref 70–99)
Glucose-Capillary: 111 mg/dL — ABNORMAL HIGH (ref 70–99)

## 2018-06-01 MED ORDER — CARVEDILOL 12.5 MG PO TABS: 6.2500 mg | ORAL_TABLET | Freq: Two times a day (BID) | ORAL | 1 refills | Status: DC

## 2018-06-01 MED ORDER — LACTULOSE 10 GM/15ML PO SOLN
30.0000 g | Freq: Two times a day (BID) | ORAL | Status: DC | PRN
  Administered 2018-06-01: 30 g via ORAL
  Filled 2018-06-01: qty 60

## 2018-06-01 MED ORDER — TRAMADOL HCL 50 MG PO TABS: 50.0000 mg | ORAL_TABLET | Freq: Three times a day (TID) | ORAL | 0 refills | Status: DC | PRN

## 2018-06-01 NOTE — Progress Notes (Signed)
Bonnie Bouillon, MD [address], [place], [place], [address] [address], [place], [place], [address] Phone: [phone]  Fax: [phone]   Subjective: Abdominal pain improved but still present. Cramping. Has not had a BM in 2 days. No N/V, no fever/chills.    Objective: Exam: Vital signs in last 24 hours: Vitals:   05/31/18 0559 05/31/18 1217 05/31/18 2001 06/01/18 0502  BP: 106/72 109/83 109/86 115/78  Pulse: 83 73 82 78  Resp: (!) 22  20 20   Temp: 98.2 F (36.8 C) 97.6 F (36.4 C) 98.3 F (36.8 C) 98.2 F (36.8 C)  TempSrc: Oral Oral    SpO2: 99% 100% 97% 95%  Weight:    88.6 kg  Height:       Weight change:   Intake/Output Summary (Last 24 hours) at 06/01/2018 1300 Last data filed at 06/01/2018 0514 Gross per 24 hour  Intake 120 ml  Output 400 ml  Net -280 ml    General: No acute distress, AAO x3 Abd: Soft, NT/ND, No HSM Skin: Warm, no rashes Neck: Supple, Trachea midline   Lab Results: Lab Results  Component Value Date   WBC 7.3 05/30/2018   HGB 8.4 (L) 05/30/2018   HCT 27.0 (L) 05/30/2018   MCV 99.6 05/30/2018   PLT 191 05/30/2018   Micro Results: Recent Results (from the past 240 hour(s))  Blood culture (routine x 2)     Status: None   Collection Time: 05/25/18 10:32 PM  Result Value Ref Range Status   Specimen Description BLOOD LEFT ANTECUBITAL  Final   Special Requests   Final    BOTTLES DRAWN AEROBIC AND ANAEROBIC Blood Culture adequate volume   Culture   Final    NO GROWTH 5 DAYS Performed at [facility] [address] [place], [place] [address]    Report Status 05/30/2018 FINAL  Final  Blood culture (routine x 2)     Status: None   Collection Time: 05/25/18 10:32 PM  Result Value Ref Range Status   Specimen Description BLOOD LEFT HAND  Final   Special Requests   Final    BOTTLES DRAWN AEROBIC AND ANAEROBIC Blood Culture adequate volume   Culture   Final    NO GROWTH 5 DAYS Performed at  [facility], [address] [place], [place] [address]    Report Status 05/30/2018 FINAL  Final   Studies/Results: Dg Chest 2 View  Result Date: 05/31/2018 CLINICAL DATA:  Right side abdominal pain radiating into the chest. EXAM: CHEST - 2 VIEW COMPARISON:  Single-view of the chest 05/25/2018. PA and lateral chest 05/19/2018 and 07/02/2017. FINDINGS: AICD is in place. Tip of the lead is in the right ventricle. Cardiomegaly without edema is seen. Small focus of linear atelectasis is noted in the lingula. No pneumothorax or pleural effusion. IMPRESSION: Cardiomegaly without pulmonary edema. Linear subsegmental atelectasis in the lingula. Electronically Signed   By: Drusilla Kanner M.D.   On: 05/31/2018 08:39   Medications:  Scheduled Meds: . carvedilol  12.5 mg Oral BID WC  . dicyclomine  20 mg Oral TID AC & HS  . docusate sodium  100 mg Oral BID  . heparin  5,000 Units Subcutaneous Q8H  . insulin aspart  0-15 Units Subcutaneous TID WC  . insulin aspart  0-5 Units Subcutaneous QHS  . lamoTRIgine  25 mg Oral BID  . lipase/protease/amylase  24,000 Units Oral TID WC  . pantoprazole  40 mg Oral Daily  .  PARoxetine  20 mg Oral Daily  . QUEtiapine  200 mg Oral QHS  . sodium chloride flush  3 mL Intravenous Q12H   Continuous Infusions: . sodium chloride Stopped (05/26/18 1412)   PRN Meds:.sodium chloride, bisacodyl, hydrALAZINE, lactulose, morphine injection, ondansetron **OR** ondansetron (ZOFRAN) IV, sodium chloride flush, traMADol, traZODone   Assessment: Active Problems:   Acute cholecystitis   Abdominal pain, right upper quadrant   Hyperbilirubinemia   Abnormal liver enzymes    Plan: Transaminases much improved today Abdominal pain possibly due to constipation Dulcolax 10 mg PO today and repeat tomorrow if no BM today Avoid hepatotoxic drugs All vital and autoimmune liver workup negative on this admission Continue CMP monitoring   LOS: 6 days   Bonnie Bouillon, MD 06/01/2018, 1:00 PM

## 2018-06-01 NOTE — Addendum Note (Signed)
Addended by: Dorcas Carrow on: 06/01/2018 10:10 PM   Modules accepted: Level of Service

## 2018-06-01 NOTE — Progress Notes (Addendum)
[facility] [place] NAME: Bonnie Ochoa    MR#:  [id number]  DATE OF BIRTH:  Jun 25, 1966  DATE OF ADMISSION:  05/25/2018 ADMITTING PHYSICIAN: Amelia Jo, MD  DATE OF DISCHARGE: 06/01/2018  PRIMARY CARE PHYSICIAN: Valerie Roys, DO    ADMISSION DIAGNOSIS:  Acute acalculous cholecystitis [K81.0] Upper abdominal pain [R10.10]  DISCHARGE DIAGNOSIS:  Active Problems:   Acute cholecystitis   Abdominal pain, right upper quadrant   Hyperbilirubinemia   Abnormal liver enzymes   SECONDARY DIAGNOSIS:   Past Medical History:  Diagnosis Date  . ADHD   . Arthritis   . Asthma   . Bipolar 1 disorder ([place])   . CHF (congestive heart failure) ([place])   . Cirrhosis of liver ([place])   . Coronary artery disease   . Depression   . Diabetes mellitus without complication ([place])   . Diverticulitis   . Hypertension   . IBS (irritable bowel syndrome)   . Insomnia   . Migraines   . PTSD (post-traumatic stress disorder)   . Restless leg syndrome   . Sleep apnea   . Vertigo     HOSPITAL COURSE:   52 year old female with past medical history of chronic systolic CHF, severe cardiomyopathy EF of 20 to 25%, PTSD, diabetes, hypertension, history of coronary artery disease IBS who presents to the hospital due to abdominal pain.  1.  Right upper quadrant abdominal pain/Abnormal LFT's- etiology remains unclear.  Patient does have abnormal LFTs but source remains unclear presently.  Patient had a right upper quadrant ultrasound which was nondiagnostic, underwent a HIDA scan which was negative for acute cholecystitis or any gallstones. - Seen by gastroenterology and serologic work-up for acute viral hepatitis was (-) and immunologic work up so far negative.  -Patient's LFTs have improved, there is no acute surgical intervention.  Patient is presently being discharged on oral pain control and outpatient follow-up with GI for her LFTs.  She is tolerating p.o.  well without any nausea or vomiting. - pt's Statin has been discontinued and can be resumed once her LFT's are improved.    2.  Chronic systolic CHF-clinically patient was not in congestive heart failure. - she will cont. Her Coreg.  There was a question of her LFTs being elevated due to chronic passive congestion but patient had no pulmonary edema or any lower extremity edema.  She was given 1 dose of IV Lasix.  LFTs have somewhat improved.  Further follow-up is with her cardiologist Dr. Rockey Situ.  3.  Diabetes type 2 without complication- on the hospital patient was on sliding scale insulin but will resume her Levemir upon discharge.  4.  History of IBS- she will continue Bentyl.  5.  Anxiety/PTSD-she will continue Paxil.  6.  GERD- she will continue Protonix.  7. Constipation - resolved with Lactulose, Miralax.    DISCHARGE CONDITIONS:   Stable.   CONSULTS OBTAINED:  Treatment Team:  Yolonda Kida, MD [name], MD  DRUG ALLERGIES:   Allergies  Allergen Reactions  . Levothyroxine Rash    DISCHARGE MEDICATIONS:   Allergies as of 06/01/2018      Reactions   Levothyroxine Rash      Medication List    STOP taking these medications   atorvastatin 40 MG tablet Commonly known as:  LIPITOR     TAKE these medications   blood glucose meter kit and supplies Kit Dispense based on patient and insurance preference. Use up to four times  daily as directed. (FOR ICD-9 250.00, 250.01).   carvedilol 12.5 MG tablet Commonly known as:  COREG Take 0.5 tablets (6.25 mg total) by mouth 2 (two) times daily with a meal. What changed:  medication strength   dicyclomine 20 MG tablet Commonly known as:  BENTYL Take 1 tablet (20 mg total) by mouth 3 (three) times daily as needed for spasms.   famotidine 40 MG tablet Commonly known as:  PEPCID Take 1 tablet (40 mg total) by mouth every evening.   Insulin Glargine 100 UNIT/ML Solostar Pen Commonly known as:   LANTUS Inject 20 Units into the skin daily at 10 pm.   Insulin Pen Needle 32G X 6 MM Misc 1 each by Does not apply route daily.   lamoTRIgine 25 MG tablet Commonly known as:  LAMICTAL Take 1 tablet (25 mg total) by mouth 2 (two) times daily.   metoCLOPramide 10 MG tablet Commonly known as:  REGLAN Take 1 tablet (10 mg total) by mouth 3 (three) times daily with meals.   neomycin-polymyxin-hydrocortisone OTIC solution Commonly known as:  CORTISPORIN Place 3 drops into the right ear 4 (four) times daily.   nitroGLYCERIN 0.4 MG SL tablet Commonly known as:  NITROSTAT Place 1 tablet (0.4 mg total) under the tongue every 5 (five) minutes as needed for chest pain.   ondansetron 4 MG disintegrating tablet Commonly known as:  ZOFRAN-ODT Take 1 tablet (4 mg total) by mouth every 8 (eight) hours as needed for nausea or vomiting.   pantoprazole 40 MG tablet Commonly known as:  PROTONIX Take 1 tablet (40 mg total) by mouth daily.   PARoxetine 30 MG tablet Commonly known as:  PAXIL TAKE 1 TABLET BY MOUTH EVERY DAY   QUEtiapine 300 MG tablet Commonly known as:  SEROQUEL TAKE 1 TABLET BY MOUTH EVERYDAY AT BEDTIME   sucralfate 1 g tablet Commonly known as:  CARAFATE Take 1 tablet (1 g total) by mouth 4 (four) times daily.   traMADol 50 MG tablet Commonly known as:  ULTRAM Take 1 tablet (50 mg total) by mouth every 8 (eight) hours as needed for moderate pain.         DISCHARGE INSTRUCTIONS:   DIET:  Cardiac diet and Diabetic diet  DISCHARGE CONDITION:  Stable  ACTIVITY:  Activity as tolerated  OXYGEN:  Home Oxygen: No.   Oxygen Delivery: room air  DISCHARGE LOCATION:  home   If you experience worsening of your admission symptoms, develop shortness of breath, life threatening emergency, suicidal or homicidal thoughts you must seek medical attention immediately by calling 911 or calling your MD immediately  if symptoms less severe.  You Must read complete  instructions/literature along with all the possible adverse reactions/side effects for all the Medicines you take and that have been prescribed to you. Take any new Medicines after you have completely understood and accpet all the possible adverse reactions/side effects.   Please note  You were cared for by a hospitalist during your hospital stay. If you have any questions about your discharge medications or the care you received while you were in the hospital after you are discharged, you can call the unit and asked to speak with the hospitalist on call if the hospitalist that took care of you is not available. Once you are discharged, your primary care physician will handle any further medical issues. Please note that NO REFILLS for any discharge medications will be authorized once you are discharged, as it is imperative that you return to  your primary care physician (or establish a relationship with a primary care physician if you do not have one) for your aftercare needs so that they can reassess your need for medications and monitor your lab values.     Today   Still having some right upper quadrant abdominal pain, LFTs have improved.  No nausea or vomiting.  Tolerating p.o. well.  VITAL SIGNS:  Blood pressure 115/78, pulse 78, temperature 98.2 F (36.8 C), resp. rate 20, height 5' 4"  (1.626 m), weight 88.6 kg, SpO2 95 %.  I/O:    Intake/Output Summary (Last 24 hours) at 06/01/2018 1304 Last data filed at 06/01/2018 0514 Gross per 24 hour  Intake 120 ml  Output 400 ml  Net -280 ml    PHYSICAL EXAMINATION:   GENERAL:  52 y.o.-year-old patient lying in bed in no acute distress.  EYES: Pupils equal, round, reactive to light and accommodation. No scleral icterus. Extraocular muscles intact.  HEENT: Head atraumatic, normocephalic. Oropharynx and nasopharynx clear.  NECK:  Supple, no jugular venous distention. No thyroid enlargement, no tenderness.  LUNGS: Normal breath sounds  bilaterally, no wheezing, rales, rhonchi. No use of accessory muscles of respiration.  CARDIOVASCULAR: S1, S2 normal. No murmurs, rubs, or gallops.  ABDOMEN: Soft, Tender in the RUQ, no rebound, rigidity, nondistended. Bowel sounds present. No organomegaly or mass.  EXTREMITIES: No cyanosis, clubbing or edema b/l.    NEUROLOGIC: Cranial nerves II through XII are intact. No focal Motor or sensory deficits b/l.   PSYCHIATRIC: The patient is alert and oriented x 3.  SKIN: No obvious rash, lesion, or ulcer.   DATA REVIEW:   CBC Recent Labs  Lab 05/30/18 0358  WBC 7.3  HGB 8.4*  HCT 27.0*  PLT 191    Chemistries  Recent Labs  Lab 05/27/18 0446  06/01/18 0445  NA 140   < > 140  K 3.3*   < > 3.3*  CL 108   < > 105  CO2 24   < > 27  GLUCOSE 131*   < > 143*  BUN 12   < > 13  CREATININE 1.03*   < > 1.14*  CALCIUM 8.6*   < > 8.3*  MG 1.8  --   --   AST 37   < > 108*  ALT 44   < > 541*  ALKPHOS 101   < > 114  BILITOT 2.2*   < > 1.8*   < > = values in this interval not displayed.    Cardiac Enzymes Recent Labs  Lab 05/26/18 0717  TROPONINI 0.04*    Microbiology Results  Results for orders placed or performed during the hospital encounter of 05/25/18  Blood culture (routine x 2)     Status: None   Collection Time: 05/25/18 10:32 PM  Result Value Ref Range Status   Specimen Description BLOOD LEFT ANTECUBITAL  Final   Special Requests   Final    BOTTLES DRAWN AEROBIC AND ANAEROBIC Blood Culture adequate volume   Culture   Final    NO GROWTH 5 DAYS Performed at [facility], [address] [place], [place] [address]    Report Status 05/30/2018 FINAL  Final  Blood culture (routine x 2)     Status: None   Collection Time: 05/25/18 10:32 PM  Result Value Ref Range Status   Specimen Description BLOOD LEFT HAND  Final   Special Requests   Final    BOTTLES DRAWN AEROBIC AND ANAEROBIC Blood Culture  adequate volume   Culture   Final    NO GROWTH 5 DAYS Performed  at [facility], [address] [place], [place] [address]    Report Status 05/30/2018 FINAL  Final    RADIOLOGY:  Dg Chest 2 View  Result Date: 05/31/2018 CLINICAL DATA:  Right side abdominal pain radiating into the chest. EXAM: CHEST - 2 VIEW COMPARISON:  Single-view of the chest 05/25/2018. PA and lateral chest 05/19/2018 and 07/02/2017. FINDINGS: AICD is in place. Tip of the lead is in the right ventricle. Cardiomegaly without edema is seen. Small focus of linear atelectasis is noted in the lingula. No pneumothorax or pleural effusion. IMPRESSION: Cardiomegaly without pulmonary edema. Linear subsegmental atelectasis in the lingula. Electronically Signed   By: Inge Rise M.D.   On: 05/31/2018 08:39      Management plans discussed with the patient, family and they are in agreement.  CODE STATUS:     Code Status Orders  (From admission, onward)         Start     Ordered   05/26/18 0254  Full code  Continuous     05/26/18 0253        Code Status History    Date Active Date Inactive Code Status Order ID Comments User Context   05/08/2018 1627 05/09/2018 1834 Full Code [id number]  Hillary Bow, MD ED    TOTAL TIME TAKING CARE OF THIS PATIENT: 40 minutes.    Henreitta Leber M.D on 06/01/2018 at 1:04 PM  Between 7am to 6pm - Pager - [phone]  After 6pm go to [email or web address] - [employer] [facility]  Office  [phone]  CC: Primary care physician; Valerie Roys, DO

## 2018-06-02 ENCOUNTER — Telehealth: Payer: Self-pay

## 2018-06-02 LAB — ANTINUCLEAR ANTIBODIES, IFA: ANA Ab, IFA: NEGATIVE

## 2018-06-02 NOTE — Telephone Encounter (Signed)
Transition Care Management Follow-up Telephone Call  Date of discharge and from where: 06/01/2018 from [facility]   How have you been since you were released from the hospital? "still a litlte sore this morning "  Any questions or concerns? No   Items Reviewed:  Did the pt receive and understand the discharge instructions provided? Yes   Medications obtained and verified? No verified, has not picked up tramadol form pharmacy yet, will pick up today   Any new allergies since your discharge? No   Dietary orders reviewed? yes  Do you have support at home? Yes   Functional Questionnaire: (I = Independent and D = Dependent) ADLs:   Bathing/Dressing- I  Meal Prep- I  Eating- i  Maintaining continence- I  Transferring/Ambulation- I  Managing Meds- I  Follow up appointments reviewed:   PCP Hospital f/u appt confirmed? Yes  Scheduled to see Dr.Johnson on 06/05/2018 @ 2:30pm.  Specialist Hospital f/u appt confirmed? Yes  Scheduled to see Dr.Tahiliani on 06/03/2018 @ 1:15pm.  Are transportation arrangements needed? No   If their condition worsens, is the pt aware to call PCP or go to the Emergency Dept.? Yes  Was the patient provided with contact information for the PCP's office or ED? Yes  Was to pt encouraged to call back with questions or concerns? Yes

## 2018-06-03 ENCOUNTER — Encounter: Payer: Self-pay | Admitting: Nurse Practitioner

## 2018-06-03 ENCOUNTER — Ambulatory Visit (INDEPENDENT_AMBULATORY_CARE_PROVIDER_SITE_OTHER): Payer: Medicare Other | Admitting: Gastroenterology

## 2018-06-03 ENCOUNTER — Ambulatory Visit (INDEPENDENT_AMBULATORY_CARE_PROVIDER_SITE_OTHER): Payer: Medicare Other | Admitting: Nurse Practitioner

## 2018-06-03 ENCOUNTER — Encounter: Payer: Self-pay | Admitting: Gastroenterology

## 2018-06-03 ENCOUNTER — Ambulatory Visit (INDEPENDENT_AMBULATORY_CARE_PROVIDER_SITE_OTHER): Payer: Medicare Other

## 2018-06-03 VITALS — BP 126/87 | HR 99 | Ht 64.0 in | Wt 201.8 lb

## 2018-06-03 VITALS — BP 132/80 | HR 95 | Ht 64.0 in | Wt 201.5 lb

## 2018-06-03 DIAGNOSIS — R748 Abnormal levels of other serum enzymes: Secondary | ICD-10-CM

## 2018-06-03 DIAGNOSIS — I10 Essential (primary) hypertension: Secondary | ICD-10-CM | POA: Diagnosis not present

## 2018-06-03 DIAGNOSIS — Z1211 Encounter for screening for malignant neoplasm of colon: Secondary | ICD-10-CM

## 2018-06-03 DIAGNOSIS — Z95810 Presence of automatic (implantable) cardiac defibrillator: Secondary | ICD-10-CM | POA: Diagnosis not present

## 2018-06-03 DIAGNOSIS — K7460 Unspecified cirrhosis of liver: Secondary | ICD-10-CM

## 2018-06-03 DIAGNOSIS — I5022 Chronic systolic (congestive) heart failure: Secondary | ICD-10-CM | POA: Diagnosis not present

## 2018-06-03 DIAGNOSIS — I428 Other cardiomyopathies: Secondary | ICD-10-CM

## 2018-06-03 DIAGNOSIS — R1010 Upper abdominal pain, unspecified: Secondary | ICD-10-CM

## 2018-06-03 LAB — ALPHA-1 ANTITRYPSIN PHENOTYPE: A1 ANTITRYPSIN SER: 183 mg/dL (ref 90–200)

## 2018-06-03 MED ORDER — FUROSEMIDE 20 MG PO TABS: ORAL_TABLET | ORAL | 3 refills | Status: DC

## 2018-06-03 MED ORDER — CARVEDILOL 12.5 MG PO TABS: 12.5000 mg | ORAL_TABLET | Freq: Two times a day (BID) | ORAL | 3 refills | Status: DC

## 2018-06-03 NOTE — Progress Notes (Signed)
Office Visit    Patient Name: Bonnie Ochoa Date of Encounter: 06/03/2018  Primary Care Provider:  Valerie Roys, DO Primary Cardiologist:  Ida Rogue, MD  Chief Complaint    52 year old female with a history of nonischemic cardiomyopathy, HFrEF, diabetes, hypertension, bipolar disorder, sleep apnea, PTSD, restless legs, and hepatic cirrhosis, who presents for follow-up after multiple recent hospitalizations including one for chest pain in September.  Past Medical History    Past Medical History:  Diagnosis Date  . Abdominal pain    a. 05/2018 HIDA scan wnl.  . ADHD   . Arthritis   . Asthma   . Bipolar 1 disorder ([place])   . Chest pain    a. Hx of cath in [place] - reportedly nl; b. 04/2018 MV: EF 22%, fixed dist ant septal, apical, and inferoapical defects - ? scar vs. attenuation. No ischemia.  . Cirrhosis of liver ([place])   . Depression   . Diabetes mellitus without complication ([place])   . Diverticulitis   . HFrEF (heart failure with reduced ejection fraction) ([place])    a. 06/2017 Echo: EF 20-25%, diff HK. Mildly dil LA.  Marland Kitchen Hypertension   . IBS (irritable bowel syndrome)   . Insomnia   . Migraines   . NICM (nonischemic cardiomyopathy) ([place])    a. EF prev 25%; b. 11/2014 s/p SJM Fortify [employer], single lead AICD (ser# [id number]); c. 06/2017 Echo: EF 20-25%.  . OSA (obstructive sleep apnea)   . PTSD (post-traumatic stress disorder)   . Restless leg syndrome   . Sleep apnea   . Vertigo    Past Surgical History:  Procedure Laterality Date  . ABDOMINAL HYSTERECTOMY    . debribalator  2016  . INSERT / REPLACE / REMOVE PACEMAKER    . INSERTION OF ICD    . TONSILECTOMY, ADENOIDECTOMY, BILATERAL MYRINGOTOMY AND TUBES    . TONSILLECTOMY    . TONSILLECTOMY    . TUBAL LIGATION  1980  . TUBAL LIGATION      Allergies  Allergies  Allergen Reactions  . Levothyroxine Rash    History of Present Illness    52 year old female with the above complex past medical history  including nonischemic cardiomyopathy with reportedly normal coronary arteries by catheterization in [place] several years ago, HFrEF with an EF of 20 to 25%, hypertension, diabetes, bipolar disorder, sleep apnea, PTSD, restless leg syndrome, hepatic cirrhosis, and right upper quadrant pain.  She has had multiple hospitalizations over the past month with prerenal azotemia and acute renal failure in September in the setting of nausea, diarrhea, and diuretic therapy.  This responded well to IV fluids and holding of Entresto, spinal lactone, and Lasix.  She was readmitted September 26 with chest pain and minimal troponin elevation.  She was seen by our team and underwent stress testing which was nonischemic.  Medical therapy was recommended.  He was advised that Entresto, beta-blocker, spironolactone, and Lasix be resumed but only the beta-blocker was resumed at discharge.  She was most recently readmitted October 14 with right upper quadrant pain.  It was initially suspected that this represented cholecystitis however, HIDA scan was normal.  Troponins were again minimally elevated.  Creatinine was stable during that admission she was discharged home October 20.  Since her discharge, she is continued to have intermittent bilateral upper quadrant discomfort and mild tenderness.  She has been seen by gastroenterology and is going to undergo colonoscopy next month.  From a cardiac standpoint, she has been doing well  over the past week.  She remains off of Entresto, spironolactone, and Lasix.  Her weight is been stable at 201 pounds.  She has not had any significant dyspnea but if she gets out and walks greater than 100 yards, she might experience dyspnea on exertion.  She has not been having any chest pain.  No PND, orthopnea, dizziness, syncope, edema, or early satiety.  Home Medications    Prior to Admission medications   Medication Sig Start Date End Date Taking? Authorizing Provider  blood glucose meter kit and  supplies KIT Dispense based on patient and insurance preference. Use up to four times daily as directed. (FOR ICD-9 250.00, 250.01). 07/24/17  Yes Johnson, Megan P, DO  carvedilol (COREG) 12.5 MG tablet Take 0.5 tablets (6.25 mg total) by mouth 2 (two) times daily with a meal. 06/01/18 07/31/18 Yes Sainani, Belia Heman, MD  dicyclomine (BENTYL) 20 MG tablet Take 1 tablet (20 mg total) by mouth 3 (three) times daily as needed for spasms. 05/08/18  Yes Johnson, Megan P, DO  famotidine (PEPCID) 40 MG tablet Take 1 tablet (40 mg total) by mouth every evening. 04/25/18 04/25/19 Yes Johnson, Megan P, DO  Insulin Glargine (LANTUS SOLOSTAR) 100 UNIT/ML Solostar Pen Inject 20 Units into the skin daily at 10 pm. 04/27/18  Yes Mody, Sital, MD  Insulin Pen Needle 32G X 6 MM MISC 1 each by Does not apply route daily. 04/07/18  Yes Johnson, Megan P, DO  lamoTRIgine (LAMICTAL) 25 MG tablet Take 1 tablet (25 mg total) by mouth 2 (two) times daily. 03/25/18  Yes [name], MD  metoCLOPramide (REGLAN) 10 MG tablet Take 1 tablet (10 mg total) by mouth 3 (three) times daily with meals. 05/01/18 05/01/19 Yes Johnson, Megan P, DO  nitroGLYCERIN (NITROSTAT) 0.4 MG SL tablet Place 1 tablet (0.4 mg total) under the tongue every 5 (five) minutes as needed for chest pain. 05/19/18 05/19/19 Yes [name], MD  ondansetron (ZOFRAN ODT) 4 MG disintegrating tablet Take 1 tablet (4 mg total) by mouth every 8 (eight) hours as needed for nausea or vomiting. 02/14/18  Yes Nance Pear, MD  pantoprazole (PROTONIX) 40 MG tablet Take 1 tablet (40 mg total) by mouth daily. 08/28/17 08/28/18 Yes [name], MD  PARoxetine (PAXIL) 30 MG tablet TAKE 1 TABLET BY MOUTH EVERY DAY 03/14/18  Yes Volney American, PA-C  QUEtiapine (SEROQUEL) 300 MG tablet TAKE 1 TABLET BY MOUTH EVERYDAY AT BEDTIME 03/14/18  Yes Volney American, PA-C  sucralfate (CARAFATE) 1 g tablet Take 1 tablet (1 g total) by mouth 4 (four) times daily. 05/01/18  Yes Johnson,  Megan P, DO  traMADol (ULTRAM) 50 MG tablet Take 1 tablet (50 mg total) by mouth every 8 (eight) hours as needed for moderate pain. 06/01/18  Yes Henreitta Leber, MD    Review of Systems    Chronic dyspnea on exertion.  Bilateral upper quadrant discomfort since her most recent hospitalization with plan for GI follow-up.  She denies palpitations, PND, orthopnea, dizziness, syncope, edema, or early satiety.  All other systems reviewed and are otherwise negative except as noted above.  Physical Exam    VS:  BP 132/80 (BP Location: Left Arm, Patient Position: Sitting, Cuff Size: Normal)   Pulse 95   Ht 5' 4" (1.626 m)   Wt 201 lb 8 oz (91.4 kg)   BMI 34.59 kg/m  , BMI Body mass index is 34.59 kg/m. GEN: Well nourished, well developed, in no acute distress. HEENT:  normal. Neck: Supple, no JVD, carotid bruits, or masses. Cardiac: RRR, no murmurs, rubs, or gallops. No clubbing, cyanosis, edema.  Radials/DP/PT 2+ and equal bilaterally.  Respiratory:  Respirations regular and unlabored, clear to auscultation bilaterally. GI: Soft, diffusely tender to mild touch, BS + x 4. MS: no deformity or atrophy. Skin: warm and dry, no rash. Neuro:  Strength and sensation are intact. Psych: Normal affect.  Accessory Clinical Findings    ECG personally reviewed by me today -regular sinus rhythm, 95, left atrial enlargement, nonspecific T changes- no acute changes.  Lab Results  Component Value Date   CREATININE 1.14 (H) 06/01/2018   BUN 13 06/01/2018   NA 140 06/01/2018   K 3.3 (L) 06/01/2018   CL 105 06/01/2018   CO2 27 06/01/2018     Assessment & Plan    1.  HFrEF/nonischemic cardiomyopathy: EF 20 to 25%.  Recent nonischemic Myoview in late September, in the setting of chest pain and chronically elevated troponin.  Volume has been stable at home.  Intracardiac monitoring was evaluated today and today is the first day in nearly a month, that impedance is normal and does not suggest volume  overload.  This is in the setting of having been off of Entresto, Spironolactone, and Lasix since mid September after admission for nausea, vomiting, and acute renal failure.  Volume is stable today.  Heart rate and blood pressure are mildly elevated I am going to have her increase her carvedilol back to 12.5 mg twice daily.  She has continued to have some nausea though is not currently vomiting and with recent hospitalization related abdominal pain and hospitalization September with acute renal failure, I remain somewhat reluctant to resume either Spironolactone or Entresto at this time.  She had labs on the 20th, which showed slight rise in creatinine to 1.14, as well.  We discussed the importance of daily weights, sodium restriction, medication compliance, and symptom reporting and she verbalizes understanding.  She will use Lasix 20 mg as needed for weight gain of 2 pounds over 24 hours or 5 pounds over the course of a week.  We will plan to see her back in 1 month, and follow-up a basic metabolic panel at that time.  Provided that renal function is stable, we can look to resume low-dose Entresto and perhaps Spironolactone at a later date.  2.  Essential hypertension: Blood pressure mildly elevated.  Titrating beta-blocker as above.  3.  Abdominal pain with transaminitis: Recent admission with abdominal discomfort.  LFTs were elevated but did trend down during hospitalization.  There was some question as to whether or not LFT elevation could represent heart failure as BNP was elevated though she was not otherwise felt to be volume overloaded on exam and weight was relatively stable.  HIDA scan was negative.  She was seen by GI this morning with tentative plans for colonoscopy.  Abdominal discomfort appears to be musculoskeletal as she is very tender with minimal touch.  4.  Chest pain: Patient with chronically elevated troponin.  Recently admitted with chest pain and underwent stress testing which was  nonischemic.  She has not had any significant chest pain since but continues to have abdominal discomfort as outlined above.  Continue medical therapy.  5.  Acute kidney injury: This occurred in mid-September in the setting of nausea, vomiting, and diuretic/heart failure medications.  Creatinine relatively stable during the last admission with slight rise to 1.14 on the day of discharge on October 20.  Entresto and  Spironolactone remain on hold at this time but we will look to resume in the future provided that renal function stable.  6.  Disposition: Follow-up in clinic in 1 month or sooner if necessary.  Murray Hodgkins, NP 06/03/2018, 3:30 PM

## 2018-06-03 NOTE — Progress Notes (Signed)
[name], MD [address]  [place]  [place], [place] [address]  Main: [phone]  Fax: [phone]   Primary Care Physician: Valerie Roys, DO  Primary Gastroenterologist:  Dr. [name]  Chief Complaint  Patient presents with  . Follow-up    Cirrhosis, ED on 10/13    HPI: Bonnie Ochoa is a 52 y.o. female here for posthospitalization follow-up.  Recently admitted with abdominal pain and also found to have elevated transaminases with negative GI work-up otherwise.  Abdominal pain is right upper quadrant, and patient underwent imaging with ultrasound, CT scan and HIDA scan,.  Ultrasound and CT scan had shown gallbladder wall thickening, but HIDA scan was negative for cholecystitis, and surgery did not recommend surgical intervention.  No evidence of biliary obstruction on imaging.  Abdominal pain is much improved but still present in the right upper quadrant.  Patient reports that she has made diet changes recently and to lose weight, and has been going to the gym, and has been doing crunches and lifting.  Current Outpatient Medications  Medication Sig Dispense Refill  . blood glucose meter kit and supplies KIT Dispense based on patient and insurance preference. Use up to four times daily as directed. (FOR ICD-9 250.00, 250.01). 1 each 0  . carvedilol (COREG) 12.5 MG tablet Take 0.5 tablets (6.25 mg total) by mouth 2 (two) times daily with a meal. 30 tablet 1  . dicyclomine (BENTYL) 20 MG tablet Take 1 tablet (20 mg total) by mouth 3 (three) times daily as needed for spasms. 90 tablet 2  . famotidine (PEPCID) 40 MG tablet Take 1 tablet (40 mg total) by mouth every evening. 90 tablet 1  . Insulin Glargine (LANTUS SOLOSTAR) 100 UNIT/ML Solostar Pen Inject 20 Units into the skin daily at 10 pm. 5 pen 12  . Insulin Pen Needle 32G X 6 MM MISC 1 each by Does not apply route daily. 100 each 12  . lamoTRIgine (LAMICTAL) 25 MG tablet Take 1 tablet (25 mg total)  by mouth 2 (two) times daily. 180 tablet 1  . metoCLOPramide (REGLAN) 10 MG tablet Take 1 tablet (10 mg total) by mouth 3 (three) times daily with meals. 90 tablet 1  . nitroGLYCERIN (NITROSTAT) 0.4 MG SL tablet Place 1 tablet (0.4 mg total) under the tongue every 5 (five) minutes as needed for chest pain. 30 tablet 0  . ondansetron (ZOFRAN ODT) 4 MG disintegrating tablet Take 1 tablet (4 mg total) by mouth every 8 (eight) hours as needed for nausea or vomiting. 20 tablet 0  . pantoprazole (PROTONIX) 40 MG tablet Take 1 tablet (40 mg total) by mouth daily. 30 tablet 11  . PARoxetine (PAXIL) 30 MG tablet TAKE 1 TABLET BY MOUTH EVERY DAY 90 tablet 1  . QUEtiapine (SEROQUEL) 300 MG tablet TAKE 1 TABLET BY MOUTH EVERYDAY AT BEDTIME 90 tablet 1  . sucralfate (CARAFATE) 1 g tablet Take 1 tablet (1 g total) by mouth 4 (four) times daily. 60 tablet 3  . traMADol (ULTRAM) 50 MG tablet Take 1 tablet (50 mg total) by mouth every 8 (eight) hours as needed for moderate pain. (Patient not taking: Reported on 06/02/2018) 30 tablet 0   No current facility-administered medications for this visit.     Allergies as of 06/03/2018 - Review Complete 06/03/2018  Allergen Reaction Noted  . Levothyroxine Rash 07/02/2017    ROS:  General: Negative for anorexia, weight loss, fever, chills, fatigue, weakness. ENT: Negative for hoarseness, difficulty swallowing ,  nasal congestion. CV: Negative for chest pain, angina, palpitations, dyspnea on exertion, peripheral edema.  Respiratory: Negative for dyspnea at rest, dyspnea on exertion, cough, sputum, wheezing.  GI: See history of present illness. GU:  Negative for dysuria, hematuria, urinary incontinence, urinary frequency, nocturnal urination.  Endo: Negative for unusual weight change.    Physical Examination:   BP 126/87   Pulse 99   Ht 5' 4"  (1.626 m)   Wt 201 lb 12.8 oz (91.5 kg)   BMI 34.64 kg/m   General: Well-nourished, well-developed in no acute  distress.  Eyes: No icterus. Conjunctivae pink. Mouth: Oropharyngeal mucosa moist and pink , no lesions erythema or exudate. Neck: Supple, Trachea midline Abdomen: Bowel sounds are normal, right upper quadrant pain present on superficial palpation of the right upper quadrant, nondistended, no hepatosplenomegaly or masses, no abdominal bruits or hernia , no rebound or guarding.   Extremities: No lower extremity edema. No clubbing or deformities. Neuro: Alert and oriented x 3.  Grossly intact. Skin: Warm and dry, no jaundice.   Psych: Alert and cooperative, normal mood and affect.   Labs: CMP     Component Value Date/Time   NA 140 06/01/2018 0445   NA 142 05/01/2018 1200   K 3.3 (L) 06/01/2018 0445   CL 105 06/01/2018 0445   CO2 27 06/01/2018 0445   GLUCOSE 143 (H) 06/01/2018 0445   BUN 13 06/01/2018 0445   BUN 39 (H) 05/01/2018 1200   CREATININE 1.14 (H) 06/01/2018 0445   CALCIUM 8.3 (L) 06/01/2018 0445   PROT 7.4 06/01/2018 0445   PROT 8.7 (H) 04/07/2018 1633   ALBUMIN 3.3 (L) 06/01/2018 0445   ALBUMIN 4.8 04/07/2018 1633   AST 108 (H) 06/01/2018 0445   ALT 541 (H) 06/01/2018 0445   ALKPHOS 114 06/01/2018 0445   BILITOT 1.8 (H) 06/01/2018 0445   BILITOT 0.8 04/07/2018 1633   GFRNONAA 54 (L) 06/01/2018 0445   GFRAA >60 06/01/2018 0445   Lab Results  Component Value Date   WBC 7.3 05/30/2018   HGB 8.4 (L) 05/30/2018   HCT 27.0 (L) 05/30/2018   MCV 99.6 05/30/2018   PLT 191 05/30/2018    Imaging Studies: Dg Chest 2 View  Result Date: 05/31/2018 CLINICAL DATA:  Right side abdominal pain radiating into the chest. EXAM: CHEST - 2 VIEW COMPARISON:  Single-view of the chest 05/25/2018. PA and lateral chest 05/19/2018 and 07/02/2017. FINDINGS: AICD is in place. Tip of the lead is in the right ventricle. Cardiomegaly without edema is seen. Small focus of linear atelectasis is noted in the lingula. No pneumothorax or pleural effusion. IMPRESSION: Cardiomegaly without pulmonary  edema. Linear subsegmental atelectasis in the lingula. Electronically Signed   By: Inge Rise M.D.   On: 05/31/2018 08:39   Dg Chest 2 View  Result Date: 05/19/2018 CLINICAL DATA:  Chest pain EXAM: CHEST - 2 VIEW COMPARISON:  05/08/2018 FINDINGS: Left AICD remains in place, unchanged. Heart is borderline in size. Lingular scarring. No confluent opacity on the right. No effusions or acute bony abnormality. IMPRESSION: Lingular scarring.  No active disease. Electronically Signed   By: Rolm Baptise M.D.   On: 05/19/2018 12:18   Dg Chest 2 View  Result Date: 05/08/2018 CLINICAL DATA:  Midsternal chest pain a couple days. EXAM: CHEST - 2 VIEW COMPARISON:  04/25/2018 FINDINGS: Left-sided pacemaker unchanged. Lungs are adequately inflated without consolidation or effusion. Cardiomediastinal silhouette is within normal. Minimal degenerate change of the spine. IMPRESSION: No active cardiopulmonary disease. Electronically  Signed   By: Marin Olp M.D.   On: 05/08/2018 13:29   Nm Hepatobiliary Liver Func  Result Date: 05/26/2018 CLINICAL DATA:  52 year old female with right upper quadrant pain. Subsequent encounter. EXAM: NUCLEAR MEDICINE HEPATOBILIARY IMAGING TECHNIQUE: Sequential images of the abdomen were obtained out to 60 minutes following intravenous administration of radiopharmaceutical. RADIOPHARMACEUTICALS:  7.454 mCi Tc-60m Choletec IV COMPARISON:  05/25/2018 ultrasound. FINDINGS: Prompt uptake and biliary excretion of activity by the liver is seen. Gallbladder activity is visualized, consistent with patency of cystic duct. Biliary activity passes into small bowel, consistent with patent common bile duct. IMPRESSION: 1. Gallbladder activity is visualized, consistent with patency of cystic duct. 2. Biliary activity passes into small bowel, consistent with patent common bile duct. Electronically Signed   By: SGenia DelM.D.   On: 05/26/2018 12:59   Ct Abdomen Pelvis W Contrast  Result Date:  05/28/2018 CLINICAL DATA:  Cirrhosis of the liver, heart failure some abdominal discomfort primarily right-sided, nausea EXAM: CT ABDOMEN AND PELVIS WITH CONTRAST TECHNIQUE: Multidetector CT imaging of the abdomen and pelvis was performed using the standard protocol following bolus administration of intravenous contrast. CONTRAST:  1072mISOVUE-300 IOPAMIDOL (ISOVUE-300) INJECTION 61% COMPARISON:  CT abdomen pelvis of 02/09/2017 FINDINGS: Lower chest: There is linear atelectasis or scarring in the anterior left lower lobe. No pneumonia or effusion is evident. There is moderate cardiomegaly noted. No pericardial effusion is seen. Hepatobiliary: The liver is somewhat inhomogeneous with slightly nodular contours consistent with diagnosis of cirrhosis. No focal hepatic abnormality is seen. However, the gallbladder appears to have a somewhat thickened wall. No definite calcified gallstones are seen but either ultrasound of the right upper quadrant or nuclear medicine hepatobiliary scan would be recommended to evaluate for possible cholecystitis. Prominent gallbladder wall could also be seen in hypoproteinemia. Pancreas: The head of the pancreas is very indistinct and there is some peripancreatic fluid extending into the anterior pararenal space. Acute pancreatitis of the pancreatic head is a definite consideration. The body and tail of the pancreas is unremarkable and the pancreatic duct is not dilated. Correlate with appropriate laboratory values. Spleen: The spleen is unremarkable.  A splenule is noted medially Adrenals/Urinary Tract: The adrenal glands appear normal. The kidneys enhance with no calculus or mass. On delayed images, the pelvocaliceal systems are unremarkable. The ureters appear normal caliber. The urinary bladder is not well distended but no abnormality is seen. Stomach/Bowel: The stomach is moderately distended with food debris and oral contrast media with no definite abnormality. There is some  indistinctness of the descending duodenum which most likely is related to possible inflammatory process as described above involving the head of the pancreas. Stepped contrast is scattered throughout small bowel which is nondistended. There are a few rectosigmoid diverticula present. There is a moderate amount of feces throughout the colon. Terminal ileum and the appendix are unremarkable. Vascular/Lymphatic: The abdominal aorta is normal in caliber with mild to moderate abdominal aortic atherosclerosis present. Only a few small retroperitoneal nodes are present. Reproductive: The uterus has previously been resected. No adnexal lesion is seen. No fluid is noted within the pelvis. Other: No abdominal wall hernia is seen. Musculoskeletal: The lumbar vertebrae are in normal alignment. Degenerative disc disease is present primarily at L4-5. IMPRESSION: 1. Indistinctness around the head of the pancreas with some fluid in the anterior pararenal space may indicate acute pancreatitis involving the head of the pancreas. Correlate with appropriate laboratory values. 2. Changes of hepatic cirrhosis.  No focal hepatic abnormality.  3. Somewhat thickened gallbladder wall most likely due to hypoproteinemia but developing acute cholecystitis cannot be excluded. Correlate clinically. 4. Moderate abdominal aortic atherosclerosis. Electronically Signed   By: Ivar Drape M.D.   On: 05/28/2018 13:56   Nm Myocar Multi W/spect W/wall Motion / Ef  Result Date: 05/09/2018 Pharmacological myocardial perfusion imaging study with no significant  Ischemia Fixed distal anterior septal and apical region, also fixed inferoapical region, unable to exclude old scar vs attenuation artifact Global hypokinesis,  EF estimated at 22% No EKG changes concerning for ischemia at peak stress or in recovery. High risk scan based on low ejection fraction. Low EF is known, confirmed by prior echocardiogram. Signed, Esmond Plants, MD, Ph.D [facility]   Dg  Chest Port 1 View  Result Date: 05/25/2018 CLINICAL DATA:  Shortness of breath. Dyspnea. Asthma. Congestive heart failure. Cirrhosis. EXAM: PORTABLE CHEST 1 VIEW COMPARISON:  05/19/2018 FINDINGS: Stable mild cardiomegaly. Pacemaker remains in appropriate position. Both lungs are well aerated and clear. IMPRESSION: No active disease. Electronically Signed   By: Earle Gell M.D.   On: 05/25/2018 21:19   US Abdomen Limited Ruq  Result Date: 05/25/2018 CLINICAL DATA:  Acute onset of upper abdominal pain today. EXAM: ULTRASOUND ABDOMEN LIMITED RIGHT UPPER QUADRANT COMPARISON:  Right upper quadrant ultrasound 02/14/2018. CT abdomen and pelvis 02/09/2018 FINDINGS: Gallbladder: No gallstones identified. Gallbladder wall thickening with pericholecystic edema. Gallbladder wall measures up to about 8 mm in thickness. Murphy's sign is negative although patient was given morphine before the scan limiting the sensitivity of this sign. Common bile duct: Diameter: 4.5 mm, normal Liver: No focal lesion identified. Within normal limits in parenchymal echogenicity. Portal vein is patent on color Doppler imaging with normal direction of blood flow towards the liver. IMPRESSION: Gallbladder wall thickening and edema. No stones. Changes may be due to acalculous cholecystitis, liver disease, or other inflammatory hepatobiliary process. Could consider radionuclide biliary scan for further evaluation. Electronically Signed   By: Lucienne Capers M.D.   On: 05/25/2018 21:45    Assessment and Plan:   Bonnie Ochoa is a 52 y.o. y/o female here for follow-up of cirrhosis, and recent hospitalization for abdominal pain and was found to have elevated transaminases  Right upper quadrant pain is musculoskeletal in nature based on clinical exam and negative GI work-up otherwise Patient was asked to lay off heavy lifting, the abdominal crunches she has been doing at the gym recently, but she can continue her cardio workout.  She can  resume her normal gym activities in 2 to 3 weeks.  Her transaminases were elevated during hospitalization Viral and autoimmune hepatitis work-up was negative The only positive finding was an elevated BNP in the thousands and she was given 1 dose of IV Lasix and liver enzymes were noted to be decreased the next day, but otherwise patient did not have any signs of volume overload so it is unclear if the decrease in liver enzymes occurred due to the Lasix. We will repeat CMP today to ensure they are continuing to decrease  Patient was also using marijuana at home and she has been asked to abstain from any drugs including alcohol or marijuana or any herbal products or supplements. She had some nausea vomiting prior to presentation, and that itself can sometimes lead to elevation liver enzymes but were very acutely elevated Marijuana associated nausea vomiting was also discussed  Her cirrhosis is known to be from her EF of 25% requiring AICD No signs of volume overload Meld 12  on August 2019 labs Ultrasound up-to-date Total bilirubin chronically elevated due to cirrhosis No history of GI bleeds No confusion  She was supposed to be scheduled for EGD for variceal screening and CRC screening on last visit but patient did not have this scheduled.  We will get it scheduled today if patient willing, otherwise patient can follow-up in 1 to 2 months to get this scheduled if she would rather not schedule today   Dr [name]

## 2018-06-03 NOTE — Patient Instructions (Signed)
Medication Instructions:  Your physician has recommended you make the following change in your medication:  1- INCREASE Carvedilol to 12.5 mg (1 tablet) by mouth two times a day. 2- TAKE lasix as needed - Take 1 tablet (20 mg) by mouth once a day as needed for 2 lb weight gain.  If you need a refill on your cardiac medications before your next appointment, please call your pharmacy.   Lab work: none If you have labs (blood work) drawn today and your tests are completely normal, you will receive your results only by: Marland Kitchen MyChart Message (if you have MyChart) OR . A paper copy in the mail If you have any lab test that is abnormal or we need to change your treatment, we will call you to review the results.  Testing/Procedures: none  Follow-Up: At [facility], you and your health needs are our priority.  As part of our continuing mission to provide you with exceptional heart care, we have created designated Provider Care Teams.  These Care Teams include your primary Cardiologist (physician) and Advanced Practice Providers (APPs -  Physician Assistants and Nurse Practitioners) who all work together to provide you with the care you need, when you need it. You will need a follow up appointment in 1 months.  Please call our office 2 months in advance to schedule this appointment.  You may see DR Julien Nordmann or one of the following Advanced Practice Providers on your designated Care Team:   Nicolasa Ducking, NP Eula Listen, PA-C . Marisue Ivan, PA-C

## 2018-06-03 NOTE — Progress Notes (Signed)
EPIC Encounter for ICM Monitoring  Patient Name: Bonnie Ochoa is a 52 y.o. female Date: 06/03/2018 Primary Care Physican: Valerie Roys, DO Primary Cardiologist: Sanjuana Letters, NP Electrophysiologist: Faustino Congress Weight: 201lbs (office weight 06/03/2018)      Patient seen in office today.   Thoracic impedance normal in past 2 days but was abnormal suggesting fluid accumulation from 04/27/2018 - 06/03/2018.  Impedance occurred during 4 ER visits with 3 visits resulting in admissions since 04/25/2018  Prescribed: Furosemide20 mg 1 tablet (30m total) daily.   Labs: 06/01/2018 Creatinine 1.14, BUN 13, Potassium 3.3, Sodium 140, eGFR 54->60 05/31/2018 Creatinine 1.07, BUN 13, Potassium 3.8, Sodium 140, eGFR 58->60  05/30/2018 Creatinine 1.04, BUN 14, Potassium 3.7, Sodium 139, eGFR >60  05/29/2018 Creatinine 0.94, BUN 13, Potassium 3.5, Sodium 138, eGFR >60  05/28/2018 Creatinine 0.95, BUN 15, Potassium 3.7, Sodium 136, eGFR >60  05/27/2018 Creatinine 1.03, BUN 12, Potassium 3.3, Sodium 140, eGFR >60  05/26/2018 Creatinine 0.90, BUN 15, Potassium 3.2, Sodium 137, eGFR >60  05/25/2018 Creatinine 1.16, BUN 19, Potassium 3.3, Sodium 139, eGFR 52->60  05/19/2018 Creatinine 1.05, BUN 15, Potassium 3.5, Sodium 140, eGFR 59->60  05/09/2018 Creatinine 1.23, BUN 17, Potassium 3.1, Sodium 140, eGFR 49-57  05/08/2018 Creatinine 1.24, BUN 15, Potassium 2.8, Sodium 141, eGFR 48-56 05/01/2018 Creatinine 1.88, BUN 39, Potassium 3.3, Sodium 142, eGFR 30-34  04/27/2018 Creatinine 2.26, BUN 63, Potassium 3.1, Sodium 139, eGFR 23-27  04/26/2018 Creatinine 2.76, BUN 80, Potassium 2.3, Sodium 137, eGFR 18-21  04/25/2018 Creatinine 3.70, BUN 88, Potassium 2.4, Sodium 132, eGFR 13-15  04/24/2018 Creatinine 3.23, BUN 86, Potassium 3.0, Sodium 138, eGFR 16-18  04/07/2018 Creatinine 1.67, BUN 24, Potassium 4.3, Sodium 140, eGFR 34-40  A complete set of results can be found in Results  Review.  Recommendations: None  Follow-up plan: ICM clinic phone appointment on 07/04/2018.   Office appointment scheduled 06/03/2018 with CIgnacia Bayley NP.    Copy of ICM check sent to Dr. KCaryl Comesand provided to CIgnacia Bayleyfor office visit.    3 month ICM trend: 06/03/2018    1 Year ICM trend:       LRosalene Billings RN 06/03/2018 11:21 AM

## 2018-06-03 NOTE — Progress Notes (Signed)
ekg 

## 2018-06-04 LAB — COMPREHENSIVE METABOLIC PANEL
A/G RATIO: 1.2 (ref 1.2–2.2)
ALBUMIN: 4.3 g/dL (ref 3.5–5.5)
ALT: 266 IU/L — ABNORMAL HIGH (ref 0–32)
AST: 27 IU/L (ref 0–40)
Alkaline Phosphatase: 133 IU/L — ABNORMAL HIGH (ref 39–117)
BILIRUBIN TOTAL: 1 mg/dL (ref 0.0–1.2)
BUN / CREAT RATIO: 10 (ref 9–23)
BUN: 9 mg/dL (ref 6–24)
CHLORIDE: 105 mmol/L (ref 96–106)
CO2: 22 mmol/L (ref 20–29)
Calcium: 9.2 mg/dL (ref 8.7–10.2)
Creatinine, Ser: 0.9 mg/dL (ref 0.57–1.00)
GFR calc non Af Amer: 73 mL/min/{1.73_m2} (ref >59)
GFR, EST AFRICAN AMERICAN: 84 mL/min/{1.73_m2} (ref >59)
Globulin, Total: 3.6 g/dL (ref 1.5–4.5)
Glucose: 190 mg/dL — ABNORMAL HIGH (ref 65–99)
POTASSIUM: 4.2 mmol/L (ref 3.5–5.2)
Sodium: 142 mmol/L (ref 134–144)
TOTAL PROTEIN: 7.9 g/dL (ref 6.0–8.5)

## 2018-06-05 ENCOUNTER — Encounter: Payer: Self-pay | Admitting: Family Medicine

## 2018-06-05 ENCOUNTER — Ambulatory Visit: Payer: Self-pay | Admitting: Family Medicine

## 2018-06-05 ENCOUNTER — Ambulatory Visit (INDEPENDENT_AMBULATORY_CARE_PROVIDER_SITE_OTHER): Payer: Medicare Other | Admitting: Family Medicine

## 2018-06-05 VITALS — BP 116/82 | HR 96

## 2018-06-05 DIAGNOSIS — R079 Chest pain, unspecified: Secondary | ICD-10-CM

## 2018-06-05 DIAGNOSIS — I5022 Chronic systolic (congestive) heart failure: Secondary | ICD-10-CM

## 2018-06-05 DIAGNOSIS — K810 Acute cholecystitis: Secondary | ICD-10-CM

## 2018-06-05 DIAGNOSIS — G4733 Obstructive sleep apnea (adult) (pediatric): Secondary | ICD-10-CM | POA: Diagnosis not present

## 2018-06-05 DIAGNOSIS — R7989 Other specified abnormal findings of blood chemistry: Secondary | ICD-10-CM

## 2018-06-05 DIAGNOSIS — R945 Abnormal results of liver function studies: Secondary | ICD-10-CM

## 2018-06-05 DIAGNOSIS — N289 Disorder of kidney and ureter, unspecified: Secondary | ICD-10-CM

## 2018-06-05 DIAGNOSIS — R1011 Right upper quadrant pain: Secondary | ICD-10-CM

## 2018-06-05 DIAGNOSIS — H539 Unspecified visual disturbance: Secondary | ICD-10-CM | POA: Diagnosis not present

## 2018-06-05 MED ORDER — CYCLOBENZAPRINE HCL 5 MG PO TABS: 5.0000 mg | ORAL_TABLET | Freq: Three times a day (TID) | ORAL | 1 refills | Status: DC | PRN

## 2018-06-05 NOTE — Assessment & Plan Note (Signed)
Off her entresto and spironalactone. To follow up with cardiology in 1 month to see if they have improved enough to restart. Euvolemic today.

## 2018-06-05 NOTE — Assessment & Plan Note (Signed)
Surgery did not think she needs CCY. Working with GI. Transaminases improving. Continue to monitor. Call with any concerns.

## 2018-06-05 NOTE — Progress Notes (Signed)
BP 116/82   Pulse 96   SpO2 99%    Subjective:    Patient ID: Bonnie Ochoa, female    DOB: 1966/05/11, 52 y.o.   MRN: [id number]  HPI: Bonnie Ochoa is a 52 y.o. female  Chief Complaint  Patient presents with  . Hospitalization Follow-up   Transition of Care Hospital Follow up.   Hospital/Facility: [facility] D/C Physician: Dr. Verdell Carmine D/C Date: 06/01/18  Records Requested: 06/01/18 Records Received: 06/01/18 Records Reviewed: 06/01/18  Diagnoses on Discharge: Acute acalculous cholecysitis, abnormal liver enzymes, RUQ pain  Date of interactive Contact within 48 hours of discharge: 06/02/18 Contact was through: phone  Date of 7 day or 14 day face-to-face visit: 06/05/18   within 7 days  Outpatient Encounter Medications as of 06/05/2018  Medication Sig  . blood glucose meter kit and supplies KIT Dispense based on patient and insurance preference. Use up to four times daily as directed. (FOR ICD-9 250.00, 250.01).  . carvedilol (COREG) 12.5 MG tablet Take 1 tablet (12.5 mg total) by mouth 2 (two) times daily.  Marland Kitchen dicyclomine (BENTYL) 20 MG tablet Take 1 tablet (20 mg total) by mouth 3 (three) times daily as needed for spasms.  . famotidine (PEPCID) 40 MG tablet Take 1 tablet (40 mg total) by mouth every evening.  . furosemide (LASIX) 20 MG tablet Take 1 tablet (20 mg) by mouth once a day as needed for weight gain of 2 lbs.  . Insulin Glargine (LANTUS SOLOSTAR) 100 UNIT/ML Solostar Pen Inject 20 Units into the skin daily at 10 pm.  . Insulin Pen Needle 32G X 6 MM MISC 1 each by Does not apply route daily.  Marland Kitchen lamoTRIgine (LAMICTAL) 25 MG tablet Take 1 tablet (25 mg total) by mouth 2 (two) times daily.  . metoCLOPramide (REGLAN) 10 MG tablet Take 1 tablet (10 mg total) by mouth 3 (three) times daily with meals.  . nitroGLYCERIN (NITROSTAT) 0.4 MG SL tablet Place 1 tablet (0.4 mg total) under the tongue every 5 (five) minutes as needed for chest pain.  Marland Kitchen ondansetron (ZOFRAN ODT) 4 MG  disintegrating tablet Take 1 tablet (4 mg total) by mouth every 8 (eight) hours as needed for nausea or vomiting.  . pantoprazole (PROTONIX) 40 MG tablet Take 1 tablet (40 mg total) by mouth daily.  Marland Kitchen PARoxetine (PAXIL) 30 MG tablet TAKE 1 TABLET BY MOUTH EVERY DAY  . QUEtiapine (SEROQUEL) 300 MG tablet TAKE 1 TABLET BY MOUTH EVERYDAY AT BEDTIME  . sucralfate (CARAFATE) 1 g tablet Take 1 tablet (1 g total) by mouth 4 (four) times daily.  . traMADol (ULTRAM) 50 MG tablet Take 1 tablet (50 mg total) by mouth every 8 (eight) hours as needed for moderate pain.  . cyclobenzaprine (FLEXERIL) 5 MG tablet Take 1 tablet (5 mg total) by mouth 3 (three) times daily as needed for muscle spasms.   No facility-administered encounter medications on file as of 06/05/2018.    Per discharge Summary: " HOSPITAL COURSE:   52 year old female with past medical history of chronic systolic CHF, severe cardiomyopathy EF of 20 to 25%, PTSD, diabetes, hypertension, history of coronary artery disease IBS who presents to the hospital due to abdominal pain.  1. Right upper quadrant abdominal pain/Abnormal LFT's- etiology remains unclear. Patient does have abnormal LFTs but source remains unclear presently. Patient had a right upper quadrant ultrasound which was nondiagnostic, underwent a HIDA scan which was negative for acute cholecystitis or any gallstones. - Seen by gastroenterology and serologic work-up  for acute viral hepatitis was (-) and immunologic work up so far negative.  -Patient's LFTs have improved, there is no acute surgical intervention.  Patient is presently being discharged on oral pain control and outpatient follow-up with GI for her LFTs.  She is tolerating p.o. well without any nausea or vomiting. - pt's Statin has been discontinued and can be resumed once her LFT's are improved.    2. Chronic systolic CHF-clinically patient was not in congestive heart failure. - she will cont. Her Coreg.  There  was a question of her LFTs being elevated due to chronic passive congestion but patient had no pulmonary edema or any lower extremity edema.  She was given 1 dose of IV Lasix.  LFTs have somewhat improved.  Further follow-up is with her cardiologist Dr. Rockey Situ.  3. Diabetes type 2 without complication- on the hospital patient was on sliding scale insulin but will resume her Levemir upon discharge.  4. History of IBS- she will continue Bentyl.  5. Anxiety/PTSD-she will continue Paxil.  6. GERD- she will continue Protonix.  7. Constipation - resolved with Lactulose, Miralax."     Diagnostic Tests Reviewed:  CLINICAL DATA:  Acute onset of upper abdominal pain today.  EXAM: ULTRASOUND ABDOMEN LIMITED RIGHT UPPER QUADRANT  COMPARISON:  Right upper quadrant ultrasound 02/14/2018. CT abdomen and pelvis 02/09/2018  FINDINGS: Gallbladder:  No gallstones identified. Gallbladder wall thickening with pericholecystic edema. Gallbladder wall measures up to about 8 mm in thickness. Murphy's sign is negative although patient was given morphine before the scan limiting the sensitivity of this sign.  Common bile duct:  Diameter: 4.5 mm, normal  Liver:  No focal lesion identified. Within normal limits in parenchymal echogenicity. Portal vein is patent on color Doppler imaging with normal direction of blood flow towards the liver.  IMPRESSION: Gallbladder wall thickening and edema. No stones. Changes may be due to acalculous cholecystitis, liver disease, or other inflammatory hepatobiliary process. Could consider radionuclide biliary scan for further evaluation.  CLINICAL DATA:  52 year old female with right upper quadrant pain. Subsequent encounter.  EXAM: NUCLEAR MEDICINE HEPATOBILIARY IMAGING  TECHNIQUE: Sequential images of the abdomen were obtained out to 60 minutes following intravenous administration of radiopharmaceutical.  RADIOPHARMACEUTICALS:   7.454 mCi Tc-32m Choletec IV  COMPARISON:  05/25/2018 ultrasound.  FINDINGS: Prompt uptake and biliary excretion of activity by the liver is seen. Gallbladder activity is visualized, consistent with patency of cystic duct. Biliary activity passes into small bowel, consistent with patent common bile duct.  IMPRESSION: 1. Gallbladder activity is visualized, consistent with patency of cystic duct. 2. Biliary activity passes into small bowel, consistent with patent common bile duct.  CLINICAL DATA:  Cirrhosis of the liver, heart failure some abdominal discomfort primarily right-sided, nausea  EXAM: CT ABDOMEN AND PELVIS WITH CONTRAST  TECHNIQUE: Multidetector CT imaging of the abdomen and pelvis was performed using the standard protocol following bolus administration of intravenous contrast.  CONTRAST:  1049mISOVUE-300 IOPAMIDOL (ISOVUE-300) INJECTION 61%  COMPARISON:  CT abdomen pelvis of 02/09/2017  FINDINGS: Lower chest: There is linear atelectasis or scarring in the anterior left lower lobe. No pneumonia or effusion is evident. There is moderate cardiomegaly noted. No pericardial effusion is seen.  Hepatobiliary: The liver is somewhat inhomogeneous with slightly nodular contours consistent with diagnosis of cirrhosis. No focal hepatic abnormality is seen. However, the gallbladder appears to have a somewhat thickened wall. No definite calcified gallstones are seen but either ultrasound of the right upper quadrant or nuclear medicine hepatobiliary  scan would be recommended to evaluate for possible cholecystitis. Prominent gallbladder wall could also be seen in hypoproteinemia.  Pancreas: The head of the pancreas is very indistinct and there is some peripancreatic fluid extending into the anterior pararenal space. Acute pancreatitis of the pancreatic head is a definite consideration. The body and tail of the pancreas is unremarkable and the pancreatic duct is  not dilated. Correlate with appropriate laboratory values.  Spleen: The spleen is unremarkable.  A splenule is noted medially  Adrenals/Urinary Tract: The adrenal glands appear normal. The kidneys enhance with no calculus or mass. On delayed images, the pelvocaliceal systems are unremarkable. The ureters appear normal caliber. The urinary bladder is not well distended but no abnormality is seen.  Stomach/Bowel: The stomach is moderately distended with food debris and oral contrast media with no definite abnormality. There is some indistinctness of the descending duodenum which most likely is related to possible inflammatory process as described above involving the head of the pancreas. Stepped contrast is scattered throughout small bowel which is nondistended. There are a few rectosigmoid diverticula present. There is a moderate amount of feces throughout the colon. Terminal ileum and the appendix are unremarkable.  Vascular/Lymphatic: The abdominal aorta is normal in caliber with mild to moderate abdominal aortic atherosclerosis present. Only a few small retroperitoneal nodes are present.  Reproductive: The uterus has previously been resected. No adnexal lesion is seen. No fluid is noted within the pelvis.  Other: No abdominal wall hernia is seen.  Musculoskeletal: The lumbar vertebrae are in normal alignment. Degenerative disc disease is present primarily at L4-5.  IMPRESSION: 1. Indistinctness around the head of the pancreas with some fluid in the anterior pararenal space may indicate acute pancreatitis involving the head of the pancreas. Correlate with appropriate laboratory values. 2. Changes of hepatic cirrhosis.  No focal hepatic abnormality. 3. Somewhat thickened gallbladder wall most likely due to hypoproteinemia but developing acute cholecystitis cannot be excluded. Correlate clinically. 4. Moderate abdominal aortic atherosclerosis.  EXAM: CHEST - 2  VIEW  COMPARISON:  Single-view of the chest 05/25/2018. PA and lateral chest 05/19/2018 and 07/02/2017.  FINDINGS: AICD is in place. Tip of the lead is in the right ventricle. Cardiomegaly without edema is seen. Small focus of linear atelectasis is noted in the lingula. No pneumothorax or pleural effusion.  IMPRESSION: Cardiomegaly without pulmonary edema.  Linear subsegmental atelectasis in the lingula.  Disposition: Home  Consults: Cardiology GI General Surgery  Discharge Instructions: Follow up with GI  Disease/illness Education: Given today  Home Health/Community Services Discussions/Referrals: N/A  Establishment or re-establishment of referral orders for community resources: N/A  Discussion with other health care providers: None  Assessment and Support of treatment regimen adherence: [place] with: Patient and her wife  Since her discharge, Masie has been feeling terrible. She states that she had a panic attack right before she came in and her pain started to get worse. She is very uncomfortable today. She rates her pain at a 10/10. She is not able to walk more than 5 feet without stopping to rest. She had evaluation in the hospital of her gall bladder including HIDA scan which was negative for cholecystitis. They signed off. No signs of billiary dyskinesia on her HIDA. Since getting out of the hospital, she has had follow ups with her GI doctor and her cardiologist. When she was seen on 06/03/18 at the GI doctor, her pain was much improved, but she was still having some in the RUQ, it  was thought that her pain was musculoskeletal in nature and not GI related. She was advised to stop doing crunches at the gym. Her work up for viral hepatitis and autoimmune hepatitis were negative in the hospital. She is scheduled for colonoscopy and EGD in 2 weeks.   She saw cardiology also on 06/03/18- has been off her entresto and spironalactone due to volume  depletion at last hospitalization.  CHEST PAIN Time since onset: 10 minutes Onset: sudden Quality: sharp Severity: severe Location: left para substernal Radiation: none Episode duration: couple of minutes Frequency: coming and going Related to exertion: no Activity when pain started: getting to doctors office with  Trauma: no Anxiety/recent stressors: yes Status: worse Treatments attempted: nothing  Current pain status: in pain Shortness of breath: yes Cough: yes Nausea: yes Diaphoresis: yes Heartburn: no Palpitations: no  SLEEP APNEA Sleep apnea status: uncontrolled Duration: chronic Satisfied with current treatment?:  no CPAP use:  Only 2x a week Sleep quality with CPAP use: average Treament compliance:fair compliance Last sleep study: 02/26/17 Treatments attempted: CPAP Wakes feeling refreshed:  yes Daytime hypersomnolence:  no Fatigue:  yes Insomnia:  no Good sleep hygiene:  yes Difficulty falling asleep:  no Difficulty staying asleep:  no Snoring bothers bed partner:  yes Observed apnea by bed partner: yes Obesity:  yes Hypertension: yes  Pulmonary hypertension:  yes Coronary artery disease:  yes  Having difficulty seeing out of her L eye.   Relevant past medical, surgical, family and social history reviewed and updated as indicated. Interim medical history since our last visit reviewed. Allergies and medications reviewed and updated.  Review of Systems  Constitutional: Positive for activity change, diaphoresis and fatigue. Negative for appetite change, chills, fever and unexpected weight change.  HENT: Negative.   Eyes: Positive for visual disturbance. Negative for photophobia, pain, discharge, redness and itching.  Respiratory: Positive for cough and shortness of breath. Negative for apnea, choking, chest tightness, wheezing and stridor.   Cardiovascular: Positive for chest pain. Negative for palpitations and leg swelling.  Gastrointestinal: Positive  for abdominal pain. Negative for abdominal distention, anal bleeding, blood in stool, constipation, diarrhea, nausea, rectal pain and vomiting.  Musculoskeletal: Positive for myalgias. Negative for arthralgias, back pain, gait problem, joint swelling, neck pain and neck stiffness.  Skin: Negative.   Neurological: Negative.   Psychiatric/Behavioral: Negative for agitation, behavioral problems, confusion, decreased concentration, dysphoric mood, hallucinations, self-injury, sleep disturbance and suicidal ideas. The patient is nervous/anxious. The patient is not hyperactive.     Per HPI unless specifically indicated above     Objective:    BP 116/82   Pulse 96   SpO2 99%   Wt Readings from Last 3 Encounters:  06/03/18 201 lb 8 oz (91.4 kg)  06/03/18 201 lb 12.8 oz (91.5 kg)  06/01/18 195 lb 4.8 oz (88.6 kg)    Physical Exam  Constitutional: She is oriented to person, place, and time. She appears well-developed and well-nourished. She appears ill. She appears distressed.  HENT:  Head: Normocephalic and atraumatic.  Right Ear: Hearing normal.  Left Ear: Hearing normal.  Nose: Nose normal.  Eyes: Conjunctivae and lids are normal. Right eye exhibits no discharge. Left eye exhibits no discharge. No scleral icterus.  Cardiovascular: Normal rate, regular rhythm, normal heart sounds and intact distal pulses. Exam reveals no gallop and no friction rub.  No murmur heard. Pulmonary/Chest: Effort normal and breath sounds normal. No stridor. No respiratory distress. She has no wheezes. She has no rales. She exhibits  no tenderness.  Abdominal: Soft. Bowel sounds are normal. She exhibits no distension and no mass. There is tenderness (In RUQ_ seems to be diaphragm not abdomen). There is no rebound and no guarding. No hernia.  Musculoskeletal: Normal range of motion.  Neurological: She is alert and oriented to person, place, and time.  Skin: Skin is warm, dry and intact. Capillary refill takes less  than 2 seconds. No rash noted. No erythema. No pallor.  Psychiatric: She has a normal mood and affect. Her speech is normal and behavior is normal. Judgment and thought content normal. Cognition and memory are normal.  Nursing note and vitals reviewed.   Results for orders placed or performed in visit on 06/03/18  Comprehensive metabolic panel  Result Value Ref Range   Glucose 190 (H) 65 - 99 mg/dL   BUN 9 6 - 24 mg/dL   Creatinine, Ser 0.90 0.57 - 1.00 mg/dL   GFR calc non Af Amer 73 >59 mL/min/1.73   GFR calc Af Amer 84 >59 mL/min/1.73   BUN/Creatinine Ratio 10 9 - 23   Sodium 142 134 - 144 mmol/L   Potassium 4.2 3.5 - 5.2 mmol/L   Chloride 105 96 - 106 mmol/L   CO2 22 20 - 29 mmol/L   Calcium 9.2 8.7 - 10.2 mg/dL   Total Protein 7.9 6.0 - 8.5 g/dL   Albumin 4.3 3.5 - 5.5 g/dL   Globulin, Total 3.6 1.5 - 4.5 g/dL   Albumin/Globulin Ratio 1.2 1.2 - 2.2   Bilirubin Total 1.0 0.0 - 1.2 mg/dL   Alkaline Phosphatase 133 (H) 39 - 117 IU/L   AST 27 0 - 40 IU/L   ALT 266 (H) 0 - 32 IU/L      Assessment & Plan:   Problem List Items Addressed This Visit      Cardiovascular and Mediastinum   Chronic systolic heart failure (HCC) (Chronic)    Off her entresto and spironalactone. To follow up with cardiology in 1 month to see if they have improved enough to restart. Euvolemic today.        Respiratory   Obstructive sleep apnea (Chronic)    Needs her tubing and max replaced. Encouraged patient to use CPAP nightly. Will get CPAP supplies for her.         Digestive   Acute cholecystitis    Surgery did not think she needs CCY. Working with GI. Transaminases improving. Continue to monitor. Call with any concerns.         Other   Chest pain - Primary   Relevant Orders   EKG 12-Lead (Completed)    Other Visit Diagnoses    RUQ pain       Agree with GI that this is likely muscular. Will start flexeril to help with spasms. Follow up 1-2 weeks.    Abnormal LFTs       Following  with GI. These have been improving. Call with any concerns.    Abnormal kidney function       Off her entresto and spironalactone. To follow up with cardiology in 1 month to see if they have improved enough to restart.    Visual changes       Needs to see opthalmology. Referral generated today.   Relevant Orders   Ambulatory referral to Ophthalmology       Follow up plan: Return 1-2 weeks, for follow up pain.

## 2018-06-05 NOTE — Assessment & Plan Note (Signed)
Needs her tubing and max replaced. Encouraged patient to use CPAP nightly. Will get CPAP supplies for her.

## 2018-06-06 ENCOUNTER — Telehealth: Payer: Self-pay

## 2018-06-06 NOTE — Telephone Encounter (Signed)
-----   Message from Pasty Spillers, MD sent at 06/04/2018 12:56 PM EDT ----- [name], please let patient know her liver enzymes are continuing to improve.  She should stay away from alcohol, drugs, or any over-the-counter products or herbal supplements.  Please order repeat CMP, with GGT in 1 week.

## 2018-06-06 NOTE — Telephone Encounter (Signed)
Unable to reach pt at home or cell number. Left message to check my chart for lab results and contact office if any questions.

## 2018-06-07 ENCOUNTER — Inpatient Hospital Stay
Admission: EM | Admit: 2018-06-07 | Discharge: 2018-06-13 | DRG: 286 | Disposition: A | Discharge status: Home / Self Care | Payer: Medicare Other | Attending: Internal Medicine | Admitting: Internal Medicine

## 2018-06-07 ENCOUNTER — Emergency Department: Payer: Medicare Other

## 2018-06-07 ENCOUNTER — Encounter: Payer: Self-pay | Admitting: Emergency Medicine

## 2018-06-07 ENCOUNTER — Other Ambulatory Visit: Payer: Self-pay

## 2018-06-07 DIAGNOSIS — Z90710 Acquired absence of both cervix and uterus: Secondary | ICD-10-CM | POA: Diagnosis not present

## 2018-06-07 DIAGNOSIS — Z833 Family history of diabetes mellitus: Secondary | ICD-10-CM

## 2018-06-07 DIAGNOSIS — I130 Hypertensive heart and chronic kidney disease with heart failure and stage 1 through stage 4 chronic kidney disease, or unspecified chronic kidney disease: Principal | ICD-10-CM | POA: Diagnosis present

## 2018-06-07 DIAGNOSIS — Z8249 Family history of ischemic heart disease and other diseases of the circulatory system: Secondary | ICD-10-CM

## 2018-06-07 DIAGNOSIS — Z79899 Other long term (current) drug therapy: Secondary | ICD-10-CM | POA: Diagnosis not present

## 2018-06-07 DIAGNOSIS — R079 Chest pain, unspecified: Secondary | ICD-10-CM | POA: Diagnosis not present

## 2018-06-07 DIAGNOSIS — Z23 Encounter for immunization: Secondary | ICD-10-CM | POA: Diagnosis not present

## 2018-06-07 DIAGNOSIS — K7460 Unspecified cirrhosis of liver: Secondary | ICD-10-CM | POA: Diagnosis present

## 2018-06-07 DIAGNOSIS — Z95810 Presence of automatic (implantable) cardiac defibrillator: Secondary | ICD-10-CM

## 2018-06-07 DIAGNOSIS — I2729 Other secondary pulmonary hypertension: Secondary | ICD-10-CM | POA: Diagnosis present

## 2018-06-07 DIAGNOSIS — G2581 Restless legs syndrome: Secondary | ICD-10-CM | POA: Diagnosis present

## 2018-06-07 DIAGNOSIS — Z87891 Personal history of nicotine dependence: Secondary | ICD-10-CM | POA: Diagnosis not present

## 2018-06-07 DIAGNOSIS — G4733 Obstructive sleep apnea (adult) (pediatric): Secondary | ICD-10-CM | POA: Diagnosis present

## 2018-06-07 DIAGNOSIS — I10 Essential (primary) hypertension: Secondary | ICD-10-CM | POA: Diagnosis not present

## 2018-06-07 DIAGNOSIS — N182 Chronic kidney disease, stage 2 (mild): Secondary | ICD-10-CM | POA: Diagnosis present

## 2018-06-07 DIAGNOSIS — I214 Non-ST elevation (NSTEMI) myocardial infarction: Secondary | ICD-10-CM | POA: Diagnosis not present

## 2018-06-07 DIAGNOSIS — Z6834 Body mass index (BMI) 34.0-34.9, adult: Secondary | ICD-10-CM | POA: Diagnosis not present

## 2018-06-07 DIAGNOSIS — I248 Other forms of acute ischemic heart disease: Secondary | ICD-10-CM | POA: Diagnosis present

## 2018-06-07 DIAGNOSIS — F319 Bipolar disorder, unspecified: Secondary | ICD-10-CM | POA: Diagnosis present

## 2018-06-07 DIAGNOSIS — I428 Other cardiomyopathies: Secondary | ICD-10-CM | POA: Diagnosis present

## 2018-06-07 DIAGNOSIS — I5030 Unspecified diastolic (congestive) heart failure: Secondary | ICD-10-CM | POA: Diagnosis not present

## 2018-06-07 DIAGNOSIS — R000 Tachycardia, unspecified: Secondary | ICD-10-CM | POA: Diagnosis not present

## 2018-06-07 DIAGNOSIS — R0602 Shortness of breath: Secondary | ICD-10-CM | POA: Diagnosis not present

## 2018-06-07 DIAGNOSIS — R188 Other ascites: Secondary | ICD-10-CM | POA: Diagnosis not present

## 2018-06-07 DIAGNOSIS — R109 Unspecified abdominal pain: Secondary | ICD-10-CM | POA: Diagnosis not present

## 2018-06-07 DIAGNOSIS — J45909 Unspecified asthma, uncomplicated: Secondary | ICD-10-CM | POA: Diagnosis present

## 2018-06-07 DIAGNOSIS — F4310 Post-traumatic stress disorder, unspecified: Secondary | ICD-10-CM | POA: Diagnosis present

## 2018-06-07 DIAGNOSIS — D649 Anemia, unspecified: Secondary | ICD-10-CM | POA: Diagnosis present

## 2018-06-07 DIAGNOSIS — E1165 Type 2 diabetes mellitus with hyperglycemia: Secondary | ICD-10-CM | POA: Diagnosis present

## 2018-06-07 DIAGNOSIS — E669 Obesity, unspecified: Secondary | ICD-10-CM | POA: Diagnosis present

## 2018-06-07 DIAGNOSIS — I429 Cardiomyopathy, unspecified: Secondary | ICD-10-CM | POA: Diagnosis not present

## 2018-06-07 DIAGNOSIS — E876 Hypokalemia: Secondary | ICD-10-CM | POA: Diagnosis present

## 2018-06-07 DIAGNOSIS — K589 Irritable bowel syndrome without diarrhea: Secondary | ICD-10-CM | POA: Diagnosis present

## 2018-06-07 DIAGNOSIS — I5022 Chronic systolic (congestive) heart failure: Secondary | ICD-10-CM

## 2018-06-07 DIAGNOSIS — Z794 Long term (current) use of insulin: Secondary | ICD-10-CM

## 2018-06-07 DIAGNOSIS — I5023 Acute on chronic systolic (congestive) heart failure: Secondary | ICD-10-CM

## 2018-06-07 DIAGNOSIS — I200 Unstable angina: Secondary | ICD-10-CM | POA: Diagnosis not present

## 2018-06-07 DIAGNOSIS — I959 Hypotension, unspecified: Secondary | ICD-10-CM | POA: Diagnosis not present

## 2018-06-07 LAB — BASIC METABOLIC PANEL
ANION GAP: 10 (ref 5–15)
BUN: 24 mg/dL — ABNORMAL HIGH (ref 6–20)
CHLORIDE: 105 mmol/L (ref 98–111)
CO2: 22 mmol/L (ref 22–32)
Calcium: 8.8 mg/dL — ABNORMAL LOW (ref 8.9–10.3)
Creatinine, Ser: 1.26 mg/dL — ABNORMAL HIGH (ref 0.44–1.00)
GFR calc non Af Amer: 47 mL/min — ABNORMAL LOW (ref >60)
GFR, EST AFRICAN AMERICAN: 55 mL/min — AB (ref >60)
Glucose, Bld: 239 mg/dL — ABNORMAL HIGH (ref 70–99)
POTASSIUM: 3.6 mmol/L (ref 3.5–5.1)
SODIUM: 137 mmol/L (ref 135–145)

## 2018-06-07 LAB — PROTIME-INR
INR: 1.09
Prothrombin Time: 14 s (ref 11.4–15.2)

## 2018-06-07 LAB — CBC
HEMATOCRIT: 30.4 % — AB (ref 36.0–46.0)
HEMOGLOBIN: 9 g/dL — AB (ref 12.0–15.0)
MCH: 29.5 pg (ref 26.0–34.0)
MCHC: 29.6 g/dL — ABNORMAL LOW (ref 30.0–36.0)
MCV: 99.7 fL (ref 80.0–100.0)
Platelets: 286 10*3/uL (ref 150–400)
RBC: 3.05 MIL/uL — AB (ref 3.87–5.11)
RDW: 16.9 % — AB (ref 11.5–15.5)
WBC: 7.7 10*3/uL (ref 4.0–10.5)
nRBC: 0 % (ref 0.0–0.2)

## 2018-06-07 LAB — TROPONIN I
TROPONIN I: 0.14 ng/mL — AB (ref <0.03)
Troponin I: 0.16 ng/mL (ref <0.03)
Troponin I: 0.22 ng/mL

## 2018-06-07 LAB — URINE DRUG SCREEN, QUALITATIVE (ARMC ONLY)
AMPHETAMINES, UR SCREEN: NOT DETECTED
BARBITURATES, UR SCREEN: NOT DETECTED
BENZODIAZEPINE, UR SCRN: NOT DETECTED
Cannabinoid 50 Ng, Ur ~~LOC~~: POSITIVE — AB
Cocaine Metabolite,Ur ~~LOC~~: NOT DETECTED
MDMA (Ecstasy)Ur Screen: NOT DETECTED
METHADONE SCREEN, URINE: NOT DETECTED
OPIATE, UR SCREEN: NOT DETECTED
Phencyclidine (PCP) Ur S: NOT DETECTED
Tricyclic, Ur Screen: POSITIVE — AB

## 2018-06-07 LAB — HEPATIC FUNCTION PANEL
ALBUMIN: 3.8 g/dL (ref 3.5–5.0)
ALK PHOS: 103 U/L (ref 38–126)
ALT: 125 U/L — AB (ref 0–44)
AST: 38 U/L (ref 15–41)
Bilirubin, Direct: 0.3 mg/dL — ABNORMAL HIGH (ref 0.0–0.2)
Indirect Bilirubin: 1 mg/dL — ABNORMAL HIGH (ref 0.3–0.9)
TOTAL PROTEIN: 8.1 g/dL (ref 6.5–8.1)
Total Bilirubin: 1.3 mg/dL — ABNORMAL HIGH (ref 0.3–1.2)

## 2018-06-07 LAB — URINALYSIS, COMPLETE (UACMP) WITH MICROSCOPIC
Bacteria, UA: NONE SEEN
Bilirubin Urine: NEGATIVE
Glucose, UA: NEGATIVE mg/dL
Hgb urine dipstick: NEGATIVE
KETONES UR: NEGATIVE mg/dL
LEUKOCYTES UA: NEGATIVE
Nitrite: NEGATIVE
PH: 6 (ref 5.0–8.0)
Protein, ur: 100 mg/dL — AB
Specific Gravity, Urine: 1.025 (ref 1.005–1.030)

## 2018-06-07 LAB — GLUCOSE, CAPILLARY
GLUCOSE-CAPILLARY: 164 mg/dL — AB (ref 70–99)
Glucose-Capillary: 198 mg/dL — ABNORMAL HIGH (ref 70–99)

## 2018-06-07 LAB — HEPARIN LEVEL (UNFRACTIONATED): Heparin Unfractionated: 0.24 [IU]/mL — ABNORMAL LOW (ref 0.30–0.70)

## 2018-06-07 LAB — LIPASE, BLOOD: LIPASE: 54 U/L — AB (ref 11–51)

## 2018-06-07 LAB — APTT: aPTT: 30 seconds (ref 24–36)

## 2018-06-07 MED ORDER — ACETAMINOPHEN 650 MG RE SUPP: 650.0000 mg | Freq: Four times a day (QID) | RECTAL | Status: DC | PRN

## 2018-06-07 MED ORDER — HEPARIN (PORCINE) IN NACL 100-0.45 UNIT/ML-% IJ SOLN
1350.0000 [IU]/h | INTRAMUSCULAR | Status: DC
  Administered 2018-06-07: 900 [IU]/h via INTRAVENOUS
  Administered 2018-06-08: 1200 [IU]/h via INTRAVENOUS
  Administered 2018-06-09: 1350 [IU]/h via INTRAVENOUS
  Filled 2018-06-07 (×3): qty 250

## 2018-06-07 MED ORDER — TRAMADOL HCL 50 MG PO TABS
50.0000 mg | ORAL_TABLET | Freq: Three times a day (TID) | ORAL | Status: DC | PRN
  Administered 2018-06-07 – 2018-06-12 (×5): 50 mg via ORAL
  Filled 2018-06-07 (×5): qty 1

## 2018-06-07 MED ORDER — NITROGLYCERIN 0.4 MG SL SUBL
0.4000 mg | SUBLINGUAL_TABLET | SUBLINGUAL | Status: DC | PRN
  Administered 2018-06-07 – 2018-06-08 (×3): 0.4 mg via SUBLINGUAL
  Filled 2018-06-07 (×3): qty 1

## 2018-06-07 MED ORDER — LAMOTRIGINE 25 MG PO TABS
25.0000 mg | ORAL_TABLET | Freq: Two times a day (BID) | ORAL | Status: DC
  Administered 2018-06-07 – 2018-06-13 (×11): 25 mg via ORAL
  Filled 2018-06-07 (×14): qty 1

## 2018-06-07 MED ORDER — CARVEDILOL 12.5 MG PO TABS
12.5000 mg | ORAL_TABLET | Freq: Two times a day (BID) | ORAL | Status: DC
  Administered 2018-06-07 – 2018-06-08 (×3): 12.5 mg via ORAL
  Filled 2018-06-07 (×3): qty 1

## 2018-06-07 MED ORDER — FUROSEMIDE 10 MG/ML IJ SOLN
40.0000 mg | Freq: Every day | INTRAMUSCULAR | Status: DC
  Administered 2018-06-07 – 2018-06-08 (×2): 40 mg via INTRAVENOUS
  Filled 2018-06-07 (×2): qty 4

## 2018-06-07 MED ORDER — INSULIN GLARGINE 100 UNIT/ML ~~LOC~~ SOLN
20.0000 [IU] | Freq: Every day | SUBCUTANEOUS | Status: DC
  Administered 2018-06-07: 20 [IU] via SUBCUTANEOUS
  Filled 2018-06-07 (×2): qty 0.2

## 2018-06-07 MED ORDER — PNEUMOCOCCAL VAC POLYVALENT 25 MCG/0.5ML IJ INJ: 0.5000 mL | INJECTION | INTRAMUSCULAR | Status: DC

## 2018-06-07 MED ORDER — HEPARIN BOLUS VIA INFUSION
4000.0000 [IU] | Freq: Once | INTRAVENOUS | Status: AC
  Administered 2018-06-07: 4000 [IU] via INTRAVENOUS
  Filled 2018-06-07: qty 4000

## 2018-06-07 MED ORDER — POTASSIUM CHLORIDE CRYS ER 20 MEQ PO TBCR
40.0000 meq | EXTENDED_RELEASE_TABLET | Freq: Once | ORAL | Status: AC
  Administered 2018-06-07: 40 meq via ORAL
  Filled 2018-06-07: qty 2

## 2018-06-07 MED ORDER — INSULIN ASPART 100 UNIT/ML ~~LOC~~ SOLN
0.0000 [IU] | Freq: Three times a day (TID) | SUBCUTANEOUS | Status: DC
  Administered 2018-06-07: 2 [IU] via SUBCUTANEOUS
  Administered 2018-06-08: 5 [IU] via SUBCUTANEOUS
  Administered 2018-06-08 – 2018-06-11 (×6): 2 [IU] via SUBCUTANEOUS
  Administered 2018-06-11: 3 [IU] via SUBCUTANEOUS
  Administered 2018-06-11: 2 [IU] via SUBCUTANEOUS
  Administered 2018-06-12 (×2): 1 [IU] via SUBCUTANEOUS
  Administered 2018-06-12: 5 [IU] via SUBCUTANEOUS
  Administered 2018-06-13: 3 [IU] via SUBCUTANEOUS
  Administered 2018-06-13: 1 [IU] via SUBCUTANEOUS
  Filled 2018-06-07 (×15): qty 1

## 2018-06-07 MED ORDER — INFLUENZA VAC SPLIT QUAD 0.5 ML IM SUSY
0.5000 mL | PREFILLED_SYRINGE | INTRAMUSCULAR | Status: AC
  Administered 2018-06-13: 0.5 mL via INTRAMUSCULAR
  Filled 2018-06-07: qty 0.5

## 2018-06-07 MED ORDER — DICYCLOMINE HCL 20 MG PO TABS
20.0000 mg | ORAL_TABLET | Freq: Three times a day (TID) | ORAL | Status: DC | PRN
  Administered 2018-06-10 (×2): 20 mg via ORAL
  Filled 2018-06-07 (×3): qty 1

## 2018-06-07 MED ORDER — ONDANSETRON 4 MG PO TBDP
4.0000 mg | ORAL_TABLET | Freq: Three times a day (TID) | ORAL | Status: DC | PRN
  Filled 2018-06-07: qty 1

## 2018-06-07 MED ORDER — SODIUM CHLORIDE 0.9% FLUSH
3.0000 mL | Freq: Two times a day (BID) | INTRAVENOUS | Status: DC
  Administered 2018-06-07 – 2018-06-09 (×3): 3 mL via INTRAVENOUS

## 2018-06-07 MED ORDER — FAMOTIDINE 20 MG PO TABS
40.0000 mg | ORAL_TABLET | Freq: Every evening | ORAL | Status: DC
  Administered 2018-06-07 – 2018-06-12 (×6): 40 mg via ORAL
  Filled 2018-06-07 (×6): qty 2

## 2018-06-07 MED ORDER — SODIUM CHLORIDE 0.9% FLUSH
3.0000 mL | Freq: Two times a day (BID) | INTRAVENOUS | Status: DC
  Administered 2018-06-08: 3 mL via INTRAVENOUS

## 2018-06-07 MED ORDER — ASPIRIN 81 MG PO CHEW
324.0000 mg | CHEWABLE_TABLET | Freq: Once | ORAL | Status: AC
  Administered 2018-06-07: 324 mg via ORAL
  Filled 2018-06-07: qty 4

## 2018-06-07 MED ORDER — METOCLOPRAMIDE HCL 10 MG PO TABS
10.0000 mg | ORAL_TABLET | Freq: Three times a day (TID) | ORAL | Status: DC
  Administered 2018-06-07 – 2018-06-13 (×15): 10 mg via ORAL
  Filled 2018-06-07 (×15): qty 1

## 2018-06-07 MED ORDER — ONDANSETRON HCL 4 MG PO TABS: 4.0000 mg | ORAL_TABLET | Freq: Four times a day (QID) | ORAL | Status: DC | PRN

## 2018-06-07 MED ORDER — MORPHINE SULFATE (PF) 2 MG/ML IV SOLN
0.5000 mg | Freq: Once | INTRAVENOUS | Status: AC
  Administered 2018-06-07: 0.5 mg via INTRAVENOUS
  Filled 2018-06-07: qty 1

## 2018-06-07 MED ORDER — ONDANSETRON HCL 4 MG/2ML IJ SOLN: 4.0000 mg | Freq: Four times a day (QID) | INTRAMUSCULAR | Status: DC | PRN

## 2018-06-07 MED ORDER — CYCLOBENZAPRINE HCL 10 MG PO TABS
5.0000 mg | ORAL_TABLET | Freq: Three times a day (TID) | ORAL | Status: DC | PRN
  Administered 2018-06-09: 5 mg via ORAL
  Filled 2018-06-07: qty 1

## 2018-06-07 MED ORDER — QUETIAPINE FUMARATE 300 MG PO TABS
300.0000 mg | ORAL_TABLET | Freq: Every day | ORAL | Status: DC
  Administered 2018-06-07 – 2018-06-12 (×6): 300 mg via ORAL
  Filled 2018-06-07 (×7): qty 1

## 2018-06-07 MED ORDER — HYDRALAZINE HCL 10 MG PO TABS
10.0000 mg | ORAL_TABLET | Freq: Three times a day (TID) | ORAL | Status: DC
  Administered 2018-06-07 – 2018-06-11 (×12): 10 mg via ORAL
  Filled 2018-06-07 (×16): qty 1

## 2018-06-07 MED ORDER — HEPARIN BOLUS VIA INFUSION
1100.0000 [IU] | Freq: Once | INTRAVENOUS | Status: AC
  Administered 2018-06-07: 1100 [IU] via INTRAVENOUS
  Filled 2018-06-07: qty 1100

## 2018-06-07 MED ORDER — ACETAMINOPHEN 325 MG PO TABS
650.0000 mg | ORAL_TABLET | Freq: Four times a day (QID) | ORAL | Status: DC | PRN
  Administered 2018-06-11 – 2018-06-13 (×2): 650 mg via ORAL
  Filled 2018-06-07 (×2): qty 2

## 2018-06-07 MED ORDER — PAROXETINE HCL 30 MG PO TABS
30.0000 mg | ORAL_TABLET | Freq: Every day | ORAL | Status: DC
  Administered 2018-06-07 – 2018-06-13 (×6): 30 mg via ORAL
  Filled 2018-06-07 (×8): qty 1

## 2018-06-07 MED ORDER — FUROSEMIDE 20 MG PO TABS
20.0000 mg | ORAL_TABLET | Freq: Every day | ORAL | Status: DC
  Filled 2018-06-07: qty 1

## 2018-06-07 MED ORDER — ISOSORBIDE DINITRATE 10 MG PO TABS
10.0000 mg | ORAL_TABLET | Freq: Three times a day (TID) | ORAL | Status: DC
  Administered 2018-06-07 – 2018-06-11 (×12): 10 mg via ORAL
  Filled 2018-06-07 (×16): qty 1

## 2018-06-07 MED ORDER — PANTOPRAZOLE SODIUM 40 MG PO TBEC
40.0000 mg | DELAYED_RELEASE_TABLET | Freq: Every day | ORAL | Status: DC
  Administered 2018-06-07 – 2018-06-13 (×6): 40 mg via ORAL
  Filled 2018-06-07 (×6): qty 1

## 2018-06-07 MED ORDER — SODIUM CHLORIDE 0.9% FLUSH: 3.0000 mL | INTRAVENOUS | Status: DC | PRN

## 2018-06-07 NOTE — ED Notes (Signed)
Dr. Lenard Lance aware of Troponin of 0.22.

## 2018-06-07 NOTE — ED Provider Notes (Signed)
[facility] Emergency Department Provider Note  Time seen: 10:05 AM  I have reviewed the triage vital signs and the nursing notes.   HISTORY  Chief Complaint Chest Pain    HPI Bonnie Ochoa is a 52 y.o. female with a past medical history of asthma, bipolar, chest pain status post AICD, hypertension, presents to the emergency department with complaints of chest pain and abdominal pain.  According to the patient for the past 2 days she has been experiencing a chest pain, generalized weakness shortness of breath with exertion and bilateral abdominal pain.  Patient states she was admitted for the same last week, but was told everything was normal and was discharged home.  Patient is very sleepy during examination, will wake up and talk to me but then does fall back asleep.  States she has been taking tramadol as prescribed but it has not been very helpful.   Past Medical History:  Diagnosis Date  . Abdominal pain    a. 05/2018 HIDA scan wnl.  . ADHD   . Arthritis   . Asthma   . Bipolar 1 disorder ([place])   . Chest pain    a. Hx of cath in [place] - reportedly nl; b. 04/2018 MV: EF 22%, fixed dist ant septal, apical, and inferoapical defects - ? scar vs. attenuation. No ischemia.  . Cirrhosis of liver ([place])   . Depression   . Diabetes mellitus without complication ([place])   . Diverticulitis   . HFrEF (heart failure with reduced ejection fraction) ([place])    a. 06/2017 Echo: EF 20-25%, diff HK. Mildly dil LA.  Marland Kitchen Hypertension   . IBS (irritable bowel syndrome)   . Insomnia   . Migraines   . NICM (nonischemic cardiomyopathy) ([place])    a. EF prev 25%; b. 11/2014 s/p SJM Fortify Assura, single lead AICD (ser# [id number]); c. 06/2017 Echo: EF 20-25%.  . OSA (obstructive sleep apnea)   . PTSD (post-traumatic stress disorder)   . Restless leg syndrome   . Sleep apnea   . Vertigo     Patient Active Problem List   Diagnosis Date Noted  . Abnormal liver enzymes   . Acute  cholecystitis 05/26/2018  . Abdominal pain, right upper quadrant   . Hyperbilirubinemia   . Chest pain 05/08/2018  . Acute renal failure (ARF) ([place]) 04/25/2018  . Hypothyroid 04/30/2017  . HTN (hypertension) 03/11/2017  . Multiple thyroid nodules 01/29/2017  . Chronic systolic heart failure ([place]) 10/08/2016  . Obstructive sleep apnea 10/08/2016  . Noncompliance 09/18/2016  . Cirrhosis ([place]) 09/18/2016  . Pulmonary hypertension ([place]) 05/30/2016  . Hypokalemia 05/30/2016  . NICM (nonischemic cardiomyopathy) ([place]) 05/30/2016  . Other ascites   . Benign hypertensive renal disease   . DM (diabetes mellitus), type 2 with renal complications ([place])   . Bipolar 1 disorder ([place])   . Asthma   . Generalized abdominal pain 05/07/2016    Past Surgical History:  Procedure Laterality Date  . ABDOMINAL HYSTERECTOMY    . debribalator  2016  . INSERT / REPLACE / REMOVE PACEMAKER    . INSERTION OF ICD    . TONSILECTOMY, ADENOIDECTOMY, BILATERAL MYRINGOTOMY AND TUBES    . TONSILLECTOMY    . TONSILLECTOMY    . TUBAL LIGATION  1980  . TUBAL LIGATION      Prior to Admission medications   Medication Sig Start Date End Date Taking? Authorizing Provider  blood glucose meter kit and supplies KIT Dispense based on patient and  insurance preference. Use up to four times daily as directed. (FOR ICD-9 250.00, 250.01). 07/24/17   Park Liter P, DO  carvedilol (COREG) 12.5 MG tablet Take 1 tablet (12.5 mg total) by mouth 2 (two) times daily. 06/03/18 09/01/18  Theora Gianotti, NP  cyclobenzaprine (FLEXERIL) 5 MG tablet Take 1 tablet (5 mg total) by mouth 3 (three) times daily as needed for muscle spasms. 06/05/18   Johnson, Megan P, DO  dicyclomine (BENTYL) 20 MG tablet Take 1 tablet (20 mg total) by mouth 3 (three) times daily as needed for spasms. 05/08/18   Johnson, Megan P, DO  famotidine (PEPCID) 40 MG tablet Take 1 tablet (40 mg total) by mouth every evening. 04/25/18 04/25/19  Park Liter  P, DO  furosemide (LASIX) 20 MG tablet Take 1 tablet (20 mg) by mouth once a day as needed for weight gain of 2 lbs. 06/03/18   Theora Gianotti, NP  Insulin Glargine (LANTUS SOLOSTAR) 100 UNIT/ML Solostar Pen Inject 20 Units into the skin daily at 10 pm. 04/27/18   Bettey Costa, MD  Insulin Pen Needle 32G X 6 MM MISC 1 each by Does not apply route daily. 04/07/18   Johnson, Megan P, DO  lamoTRIgine (LAMICTAL) 25 MG tablet Take 1 tablet (25 mg total) by mouth 2 (two) times daily. 03/25/18   [name], MD  metoCLOPramide (REGLAN) 10 MG tablet Take 1 tablet (10 mg total) by mouth 3 (three) times daily with meals. 05/01/18 05/01/19  Park Liter P, DO  nitroGLYCERIN (NITROSTAT) 0.4 MG SL tablet Place 1 tablet (0.4 mg total) under the tongue every 5 (five) minutes as needed for chest pain. 05/19/18 05/19/19  [name], MD  ondansetron (ZOFRAN ODT) 4 MG disintegrating tablet Take 1 tablet (4 mg total) by mouth every 8 (eight) hours as needed for nausea or vomiting. 02/14/18   Nance Pear, MD  pantoprazole (PROTONIX) 40 MG tablet Take 1 tablet (40 mg total) by mouth daily. 08/28/17 08/28/18  [name], MD  PARoxetine (PAXIL) 30 MG tablet TAKE 1 TABLET BY MOUTH EVERY DAY 03/14/18   Volney American, PA-C  QUEtiapine (SEROQUEL) 300 MG tablet TAKE 1 TABLET BY MOUTH EVERYDAY AT BEDTIME 03/14/18   Volney American, PA-C  sucralfate (CARAFATE) 1 g tablet Take 1 tablet (1 g total) by mouth 4 (four) times daily. 05/01/18   Johnson, Megan P, DO  traMADol (ULTRAM) 50 MG tablet Take 1 tablet (50 mg total) by mouth every 8 (eight) hours as needed for moderate pain. 06/01/18   Henreitta Leber, MD    Allergies  Allergen Reactions  . Levothyroxine Rash    Family History  Problem Relation Age of Onset  . Hypertension Mother   . Hyperlipidemia Mother   . Heart disease Mother   . Hypertension Sister   . Asthma Sister   . Heart disease Sister   . Diabetes Sister   . Cancer Sister   .  Alzheimer's disease Maternal Grandfather   . Hyperlipidemia Brother   . Asthma Sister   . Hypertension Sister   . Diabetes Sister     Social History Social History   Tobacco Use  . Smoking status: Former Smoker    Types: Cigarettes  . Smokeless tobacco: Never Used  . Tobacco comment: quit about 20 years ago   Substance Use Topics  . Alcohol use: Not Currently  . Drug use: Yes    Frequency: 1.0 times per week    Types: Marijuana  Review of Systems Constitutional: Negative for fever. Cardiovascular: Positive for moderate chest pain described as a dullness/pressure. Respiratory: Shortness of breath with exertion Gastrointestinal: Mild diffuse upper abdominal pain.  Positive for nausea times denies vomiting or diarrhea today. Musculoskeletal: Negative for musculoskeletal complaints Skin: Negative for skin complaints  Neurological: Negative for headache All other ROS negative  ____________________________________________   PHYSICAL EXAM:  VITAL SIGNS: ED Triage Vitals [06/07/18 0746]  Enc Vitals Group     BP (!) 131/93     Pulse Rate 98     Resp 18     Temp 97.8 F (36.6 C)     Temp Source Oral     SpO2 97 %     Weight 201 lb 8 oz (91.4 kg)     Height 5' 4"  (1.626 m)     Head Circumference      Peak Flow      Pain Score 9     Pain Loc      Pain Edu?      Excl. in [place]?    Constitutional: Alert and oriented. Well appearing and in no distress. Eyes: Normal exam ENT   Head: Normocephalic and atraumatic.   Mouth/Throat: Mucous membranes are moist. Cardiovascular: Normal rate, regular rhythm. No murmur.  Moderate central and left-sided chest tenderness to palpation Respiratory: Normal respiratory effort without tachypnea nor retractions. Breath sounds are clear Gastrointestinal: Mild upper abdominal right and left upper quadrant tenderness to palpation.  No rebound or guarding. Musculoskeletal: Nontender with normal range of motion in all extremities. No  lower extremity tenderness or edema. Neurologic:  Normal speech and language. No gross focal neurologic deficits are appreciated. Skin:  Skin is warm, dry and intact.  Psychiatric: Mood and affect are normal. Speech and behavior are normal.   ____________________________________________    EKG  EKG reviewed and interpreted by myself shows sinus tachycardia 102 bpm with a narrow QRS, normal axis, largely normal intervals and nonspecific ST changes.  ____________________________________________    RADIOLOGY  Chest x-ray shows cardiomegaly with mild pulmonary vascular congestion  ____________________________________________   INITIAL IMPRESSION / ASSESSMENT AND PLAN / ED COURSE  Pertinent labs & imaging results that were available during my care of the patient were reviewed by me and considered in my medical decision making (see chart for details).  Patient presents to the emergency department with complaints of chest and upper abdominal pain, states it feels very similar to 10 days ago and the patient was admitted to the hospital.  Patient is having chest pain with shortness of breath with exertion.  Differential this time would include ACS, chest wall pain, angina, abdominal pain, pancreatitis, gallbladder related pain.  Patient was admitted to the hospital approximately 1.5 weeks ago had a fairly significant work-up performed at that time including a HIDA scan to help rule out gallbladder pathology.  Was ultimately discharged home.  Continues to have intermittent pain although states for the past 2 days it has been more constant.  Patient had a CT scan showing possible early pancreatitis versus cholecystitis although after which had a considerable work-up including HIDA scan was negative.  Patient's troponin was slightly elevated although largely at baseline.  Today the patient's work-up shows a more elevated troponin 0 0.16.  I have added on LFTs and lipase.  We will continue to closely  monitor the patient in the emergency department while awaiting results.  Patient follows up with Dr. Rockey Situ of cardiology, if her work-up is otherwise normal we will discuss  with Dr. Rockey Situ.  Patient's LFTs are mildly elevated although largely baseline.  Lipase is slightly elevated as well possibly representing a mild pancreatitis given her CT findings from last week.  Patient's repeat troponin has resulted elevated 0.22, given the increase of 0.16 to now 0.22 with a baseline around 0.03-0.05 patient could very likely be experiencing an NSTEMI.  We will start on a heparin infusion and have the hospitalist admit the patient.  CRITICAL CARE Performed by: Harvest Dark   Total critical care time: 30 minutes  Critical care time was exclusive of separately billable procedures and treating other patients.  Critical care was necessary to treat or prevent imminent or life-threatening deterioration.  Critical care was time spent personally by me on the following activities: development of treatment plan with patient and/or surrogate as well as nursing, discussions with consultants, evaluation of patient's response to treatment, examination of patient, obtaining history from patient or surrogate, ordering and performing treatments and interventions, ordering and review of laboratory studies, ordering and review of radiographic studies, pulse oximetry and re-evaluation of patient's condition.  ____________________________________________   FINAL CLINICAL IMPRESSION(S) / ED DIAGNOSES  Chest pain Abdominal pain NSTEMI   Harvest Dark, MD 06/07/18 1215

## 2018-06-07 NOTE — ED Notes (Signed)
Pt reports that she has been having chest pain over the last week. Pt reports that she has been taking Nitro tablets which relieves the chest pain. Pt states that she has also had shortness of breath, dizziness, diaphoresis, and nausea. Pt reports episode of vomiting 2 days ago.   Pt reports that she was unable to sleep last night due to shortness of breath and chest pain. Pt currently reports that she is having 9/10 chest pain.

## 2018-06-07 NOTE — H&P (Signed)
[place] [facility] [place] NAME: Bonnie Ochoa    MR#:  [id number]  DATE OF BIRTH:  [birth year]  DATE OF ADMISSION:  06/07/2018  PRIMARY CARE PHYSICIAN: Valerie Roys, DO   REQUESTING/REFERRING PHYSICIAN: Dr. Kerman Passey  CHIEF COMPLAINT:   Chest pain on and off for 2  days, dizziness and shortness of breath HISTORY OF PRESENT ILLNESS:  Bonnie Ochoa  is a 52 y.o. female with a known history of non-ischemic cardiomyopathy EF of 20 to 25% status post AICD placement, bipolar disorder, ADHD, chronic abdominal pain with recent extensive workup including HIDA scan which was essentially normal. Patient had elevated transaminases and follows with Dr. Bonna Gains as outpatient comes in today with chest pain radiating to the jaw and shortness of breath with dizziness patient has had workup in the past. Her last stress test was in 2016 in [place] where she was told her coronaries were not blocked.  Her troponin was .22. She started on IV heparin drip. Dr. ENd aware of patient being admitted. ER physician spoke with him.  PAST MEDICAL HISTORY:   Past Medical History:  Diagnosis Date  . Abdominal pain    a. 05/2018 HIDA scan wnl.  . ADHD   . Arthritis   . Asthma   . Bipolar 1 disorder ([place])   . Chest pain    a. Hx of cath in [place] - reportedly nl; b. 04/2018 MV: EF 22%, fixed dist ant septal, apical, and inferoapical defects - ? scar vs. attenuation. No ischemia.  . Cirrhosis of liver ([place])   . Depression   . Diabetes mellitus without complication ([place])   . Diverticulitis   . HFrEF (heart failure with reduced ejection fraction) ([place])    a. 06/2017 Echo: EF 20-25%, diff HK. Mildly dil LA.  Marland Kitchen Hypertension   . IBS (irritable bowel syndrome)   . Insomnia   . Migraines   . NICM (nonischemic cardiomyopathy) ([place])    a. EF prev 25%; b. 11/2014 s/p SJM Fortify Assura, single lead AICD (ser# [id number]); c. 06/2017 Echo: EF 20-25%.  . OSA (obstructive sleep  apnea)   . PTSD (post-traumatic stress disorder)   . Restless leg syndrome   . Sleep apnea   . Vertigo     PAST SURGICAL HISTOIRY:   Past Surgical History:  Procedure Laterality Date  . ABDOMINAL HYSTERECTOMY    . debribalator  2016  . INSERT / REPLACE / REMOVE PACEMAKER    . INSERTION OF ICD    . TONSILECTOMY, ADENOIDECTOMY, BILATERAL MYRINGOTOMY AND TUBES    . TONSILLECTOMY    . TONSILLECTOMY    . TUBAL LIGATION  1980  . TUBAL LIGATION      SOCIAL HISTORY:   Social History   Tobacco Use  . Smoking status: Former Smoker    Types: Cigarettes  . Smokeless tobacco: Never Used  . Tobacco comment: quit about 20 years ago   Substance Use Topics  . Alcohol use: Not Currently    FAMILY HISTORY:   Family History  Problem Relation Age of Onset  . Hypertension Mother   . Hyperlipidemia Mother   . Heart disease Mother   . Hypertension Sister   . Asthma Sister   . Heart disease Sister   . Diabetes Sister   . Cancer Sister   . Alzheimer's disease Maternal Grandfather   . Hyperlipidemia Brother   . Asthma Sister   . Hypertension Sister   . Diabetes Sister  DRUG ALLERGIES:   Allergies  Allergen Reactions  . Levothyroxine Rash    REVIEW OF SYSTEMS:  Review of Systems  Constitutional: Positive for malaise/fatigue. Negative for chills, fever and weight loss.  HENT: Negative for ear discharge, ear pain and nosebleeds.   Eyes: Negative for blurred vision, pain and discharge.  Respiratory: Positive for shortness of breath. Negative for sputum production, wheezing and stridor.   Cardiovascular: Positive for chest pain. Negative for palpitations, orthopnea and PND.  Gastrointestinal: Negative for abdominal pain, diarrhea, nausea and vomiting.  Genitourinary: Negative for frequency and urgency.  Musculoskeletal: Negative for back pain and joint pain.  Neurological: Positive for dizziness. Negative for sensory change, speech change, focal weakness and weakness.   Psychiatric/Behavioral: Negative for depression and hallucinations. The patient is not nervous/anxious.      MEDICATIONS AT HOME:   Prior to Admission medications   Medication Sig Start Date End Date Taking? Authorizing Provider  blood glucose meter kit and supplies KIT Dispense based on patient and insurance preference. Use up to four times daily as directed. (FOR ICD-9 250.00, 250.01). 07/24/17  Yes Johnson, Megan P, DO  carvedilol (COREG) 12.5 MG tablet Take 1 tablet (12.5 mg total) by mouth 2 (two) times daily. 06/03/18 09/01/18 Yes Theora Gianotti, NP  dicyclomine (BENTYL) 20 MG tablet Take 1 tablet (20 mg total) by mouth 3 (three) times daily as needed for spasms. 05/08/18  Yes Johnson, Megan P, DO  famotidine (PEPCID) 40 MG tablet Take 1 tablet (40 mg total) by mouth every evening. 04/25/18 04/25/19 Yes Johnson, Megan P, DO  furosemide (LASIX) 20 MG tablet Take 1 tablet (20 mg) by mouth once a day as needed for weight gain of 2 lbs. Patient taking differently: Take 20 mg by mouth daily. Take 1 tablet (20 mg) by mouth once a day as needed for weight gain of 2 lbs. 06/03/18  Yes Theora Gianotti, NP  Insulin Glargine (LANTUS SOLOSTAR) 100 UNIT/ML Solostar Pen Inject 20 Units into the skin daily at 10 pm. 04/27/18  Yes Mody, Sital, MD  Insulin Pen Needle 32G X 6 MM MISC 1 each by Does not apply route daily. 04/07/18  Yes Johnson, Megan P, DO  lamoTRIgine (LAMICTAL) 25 MG tablet Take 1 tablet (25 mg total) by mouth 2 (two) times daily. 03/25/18  Yes [name], MD  metoCLOPramide (REGLAN) 10 MG tablet Take 1 tablet (10 mg total) by mouth 3 (three) times daily with meals. 05/01/18 05/01/19 Yes Johnson, Megan P, DO  nitroGLYCERIN (NITROSTAT) 0.4 MG SL tablet Place 1 tablet (0.4 mg total) under the tongue every 5 (five) minutes as needed for chest pain. 05/19/18 05/19/19 Yes [name], MD  ondansetron (ZOFRAN ODT) 4 MG disintegrating tablet Take 1 tablet (4 mg total) by mouth  every 8 (eight) hours as needed for nausea or vomiting. 02/14/18  Yes Nance Pear, MD  pantoprazole (PROTONIX) 40 MG tablet Take 1 tablet (40 mg total) by mouth daily. 08/28/17 08/28/18 Yes [name], MD  PARoxetine (PAXIL) 30 MG tablet TAKE 1 TABLET BY MOUTH EVERY DAY 03/14/18  Yes Volney American, PA-C  QUEtiapine (SEROQUEL) 300 MG tablet TAKE 1 TABLET BY MOUTH EVERYDAY AT BEDTIME Patient taking differently: Take 300 mg by mouth at bedtime.  03/14/18  Yes Volney American, PA-C  sucralfate (CARAFATE) 1 g tablet Take 1 tablet (1 g total) by mouth 4 (four) times daily. 05/01/18  Yes Johnson, Megan P, DO  traMADol (ULTRAM) 50 MG tablet Take 1 tablet (  50 mg total) by mouth every 8 (eight) hours as needed for moderate pain. 06/01/18  Yes Henreitta Leber, MD  cyclobenzaprine (FLEXERIL) 5 MG tablet Take 1 tablet (5 mg total) by mouth 3 (three) times daily as needed for muscle spasms. Patient not taking: Reported on 06/07/2018 06/05/18   Park Liter P, DO      VITAL SIGNS:  Blood pressure (!) 132/101, pulse 96, temperature 97.8 F (36.6 C), temperature source Oral, resp. rate 17, height _0  (1.626 m), weight 91.4 kg, SpO2 95 %.  PHYSICAL EXAMINATION:  GENERAL:  52 y.o.-year-old patient lying in the bed with no acute distress. obese EYES: Pupils equal, round, reactive to light and accommodation. No scleral icterus. Extraocular muscles intact.  HEENT: Head atraumatic, normocephalic. Oropharynx and nasopharynx clear.  NECK:  Supple, no jugular venous distention. No thyroid enlargement, no tenderness.  LUNGS: Normal breath sounds bilaterally, no wheezing, rales,rhonchi or crepitation. No use of accessory muscles of respiration.  CARDIOVASCULAR: S1, S2 normal. No murmurs, rubs, or gallops.  ABDOMEN: Soft, nontender, nondistended. Bowel sounds present. No organomegaly or mass.  EXTREMITIES: No pedal edema, cyanosis, or clubbing.  NEUROLOGIC: Cranial nerves II through XII are intact.  Muscle strength 5/5 in all extremities. Sensation intact. Gait not checked.  PSYCHIATRIC: The patient is alert and oriented x 3.  SKIN: No obvious rash, lesion, or ulcer.   LABORATORY PANEL:   CBC Recent Labs  Lab 06/07/18 0752  WBC 7.7  HGB 9.0*  HCT 30.4*  PLT 286   ------------------------------------------------------------------------------------------------------------------  Chemistries  Recent Labs  Lab 06/07/18 0752  NA 137  K 3.6  CL 105  CO2 22  GLUCOSE 239*  BUN 24*  CREATININE 1.26*  CALCIUM 8.8*  AST 38  ALT 125*  ALKPHOS 103  BILITOT 1.3*   ------------------------------------------------------------------------------------------------------------------  Cardiac Enzymes Recent Labs  Lab 06/07/18 1126  TROPONINI 0.22*   ------------------------------------------------------------------------------------------------------------------  RADIOLOGY:  Dg Chest 2 View  Result Date: 06/07/2018 CLINICAL DATA:  Chest pain.  Shortness of breath. EXAM: CHEST - 2 VIEW COMPARISON:  May 31, 2018 FINDINGS: Stable cardiomegaly. Minimal interstitial prominence without overt edema. Mild atelectasis in the left base. Stable AICD device. No other interval change. IMPRESSION: Cardiomegaly and probable mild pulmonary venous congestion. Electronically Signed   By: Dorise Bullion III M.D   On: 06/07/2018 09:00    EKG:   Paced rhythm IMPRESSION AND PLAN:   Aaralyn Kil  is a 52 y.o. female with a known history of non-ischemic cardiomyopathy EF of 20 to 25% status post AICD placement, bipolar disorder, ADHD, chronic abdominal pain with recent extensive workup including HIDA scan which was essentially normal. Patient had elevated transaminases and follows with Dr. Bonna Gains as outpatient comes in today with chest pain radiating to the jaw and shortness of breath with dizziness  1. unstable angina inpatient with non-ischemic cardiomyopathy EF 20 to 25% -patient  presents with chest pain bilateral radiating to the jaw, shortness of breath and dizziness. She has troponin of .22 -ER physician has spoken with cardiology -IV heparin drip -troponin times three -patient statins were discontinued recently due to elevated LFTs -continue aspirin, Coreg, Lasix  2. abdominal pain on and off with essentially negative workup recently patient is following Dr. Bonna Gains for elevated transaminases. Her statins were discontinued -workup so far has been negative. She is scheduled for colonoscopy as outpatient November 7  3. Type II diabetes  -you Lantus and sliding scale  4. Bipolar disorder continue Paxil and Seroquel  5.  DVT prophylaxis already on heparin drip   All the records are reviewed and case discussed with ED provider. Management plans discussed with the patient, family and they are in agreement.  CODE STATUS: full  TOTAL TIME TAKING CARE OF THIS PATIENT: *50* minutes.    Fritzi Mandes M.D on 06/07/2018 at 2:21 PM  Between 7am to 6pm - Pager - [phone]  After 6pm go to [email or web address] - password [email or web address] [facility]  Office  [phone]  CC: Primary care physician; Valerie Roys, DO

## 2018-06-07 NOTE — ED Triage Notes (Signed)
Pt to ed with c/o CP that started yesterday with weakness and sob when attempting to walk.  Pt reports she has hx of implanted AICD but does not feel like it went off.

## 2018-06-07 NOTE — Consult Note (Signed)
Cardiology Consultation:   Patient ID: Bonnie Ochoa MRN: [id number]; DOB: [birth year]  Admit date: 06/07/2018 Date of Consult: 06/07/2018  Primary Care Provider: Valerie Roys, DO Primary Cardiologist: Bonnie Rogue, Ochoa Primary Electrophysiologist:  Bonnie Axe, Ochoa   Patient Profile:   Bonnie Ochoa is a 52 y.o. female with a hx of chronic systolic heart failure due to non-ischemic cardiomyopathy, moderate mitral regurgitation, pulmonary hypertension, bipolar disorder, hepatic cirrhosis, hypertension, diabetes mellitus, obstructive sleep apnea, chronic anemia, and polysubstance abuse, who is being seen today for the evaluation of chest pain and elevated troponin at the request of Dr. Posey Ochoa.  History of Present Illness:   Bonnie Ochoa has been admitted multiple times at [facility], most recently for epigastric pain on 05/25/18.  Workup was concerning for acalculous cholecystitis versus pancreatitis.  The patient also complained of some chest pain and was noted to have a mild troponin elevation.  However, findings were not felt to be consistent with acute coronary syndrome.  She was discharged home 6 days ago, noting resolution of chest and abdominal pain.  She subsequently followed up in our office with Bonnie Ochoa, 4 days ago.  She reported continued intermittent upper abdominal tenderness but no chest pain.  She was off Entresto, spironolactone, and furosemide due to prerenal azotemia in the setting of nausea and diarrhea in September.  Two days ago, Bonnie Ochoa developed sharp substernal chest pain without radiation that would wax and wane in intensity without exacerbating factors.  She took sublingual NTG once at home and noticed transient improvement in the pain. She was seen by her PCP yesterday, who recommended that Bonnie Ochoa come to the ED if her chest pain were to return.  She had recurrence of chest pain overnight and therefore presented to the ED today.  She is currently pain free  after receiving morphine in the ED.  She notes progressive exertional dyspnea over the last few days.  She has stable 2-pillow orthopnea and intermittent PND. She denies worsening edema or weight changes.  She has been compliant with her medications, though her only current cardiac medications are carvedilol 12.5 mg BID and furosemide 20 mg daily.  Entresto and spironolactone have not been restarted.  Bonnie Ochoa denies recent tobacco or alcohol use.  She was smoking marijuana up until a few days ago but has stopped out of concern that it may have been laced with something.  She denies using other illicit drugs.  Past Medical History:  Diagnosis Date  . Abdominal pain    a. 05/2018 HIDA scan wnl.  . ADHD   . Arthritis   . Asthma   . Bipolar 1 disorder ([place])   . Chest pain    a. Hx of cath in [place] - reportedly nl; b. 04/2018 MV: EF 22%, fixed dist ant septal, apical, and inferoapical defects - ? scar vs. attenuation. No ischemia.  . Cirrhosis of liver ([place])   . Depression   . Diabetes mellitus without complication ([place])   . Diverticulitis   . HFrEF (heart failure with reduced ejection fraction) ([place])    a. 06/2017 Echo: EF 20-25%, diff HK. Mildly dil LA.  Marland Kitchen Hypertension   . IBS (irritable bowel syndrome)   . Insomnia   . Migraines   . NICM (nonischemic cardiomyopathy) ([place])    a. EF prev 25%; b. 11/2014 s/Ochoa SJM Fortify Assura, single lead AICD (ser# [id number]); c. 06/2017 Echo: EF 20-25%.  . OSA (obstructive sleep apnea)   . PTSD (post-traumatic stress disorder)   .  Restless leg syndrome   . Sleep apnea   . Vertigo     Past Surgical History:  Procedure Laterality Date  . ABDOMINAL HYSTERECTOMY    . debribalator  2016  . INSERT / REPLACE / REMOVE PACEMAKER    . INSERTION OF ICD    . TONSILECTOMY, ADENOIDECTOMY, BILATERAL MYRINGOTOMY AND TUBES    . TONSILLECTOMY    . TONSILLECTOMY    . TUBAL LIGATION  1980  . TUBAL LIGATION       Home Medications:  Prior to Admission medications     Medication Sig Start Date Bonnie Ochoa Date Taking? Authorizing Provider  blood glucose meter kit and supplies KIT Dispense based on patient and insurance preference. Use up to four times daily as directed. (FOR ICD-9 250.00, 250.01). 07/24/17  Yes Ochoa, Bonnie P, DO  carvedilol (COREG) 12.5 MG tablet Take 1 tablet (12.5 mg total) by mouth 2 (two) times daily. 06/03/18 09/01/18 Yes Bonnie Gianotti, Ochoa  dicyclomine (BENTYL) 20 MG tablet Take 1 tablet (20 mg total) by mouth 3 (three) times daily as needed for spasms. 05/08/18  Yes Ochoa, Bonnie P, DO  famotidine (PEPCID) 40 MG tablet Take 1 tablet (40 mg total) by mouth every evening. 04/25/18 04/25/19 Yes Ochoa, Bonnie P, DO  furosemide (LASIX) 20 MG tablet Take 1 tablet (20 mg) by mouth once a day as needed for weight gain of 2 lbs. Patient taking differently: Take 20 mg by mouth daily. Take 1 tablet (20 mg) by mouth once a day as needed for weight gain of 2 lbs. 06/03/18  Yes Bonnie Gianotti, Ochoa  Insulin Glargine (LANTUS SOLOSTAR) 100 UNIT/ML Solostar Pen Inject 20 Units into the skin daily at 10 pm. 04/27/18  Yes Ochoa, Sital, Ochoa  Insulin Pen Needle 32G X 6 MM MISC 1 each by Does not apply route daily. 04/07/18  Yes Ochoa, Bonnie P, DO  lamoTRIgine (LAMICTAL) 25 MG tablet Take 1 tablet (25 mg total) by mouth 2 (two) times daily. 03/25/18  Yes Bonnie Ochoa  metoCLOPramide (REGLAN) 10 MG tablet Take 1 tablet (10 mg total) by mouth 3 (three) times daily with meals. 05/01/18 05/01/19 Yes Ochoa, Bonnie P, DO  nitroGLYCERIN (NITROSTAT) 0.4 MG SL tablet Place 1 tablet (0.4 mg total) under the tongue every 5 (five) minutes as needed for chest pain. 05/19/18 05/19/19 Yes Bonnie Drafts, Ochoa  ondansetron (ZOFRAN ODT) 4 MG disintegrating tablet Take 1 tablet (4 mg total) by mouth every 8 (eight) hours as needed for nausea or vomiting. 02/14/18  Yes Bonnie Pear, Ochoa  pantoprazole (PROTONIX) 40 MG tablet Take 1 tablet (40 mg total) by mouth daily.  08/28/17 08/28/18 Yes Bonnie Lame, Ochoa  PARoxetine (PAXIL) 30 MG tablet TAKE 1 TABLET BY MOUTH EVERY DAY 03/14/18  Yes Bonnie Ochoa  QUEtiapine (SEROQUEL) 300 MG tablet TAKE 1 TABLET BY MOUTH EVERYDAY AT BEDTIME Patient taking differently: Take 300 mg by mouth at bedtime.  03/14/18  Yes Bonnie Ochoa  sucralfate (CARAFATE) 1 g tablet Take 1 tablet (1 g total) by mouth 4 (four) times daily. 05/01/18  Yes Ochoa, Bonnie P, DO  traMADol (ULTRAM) 50 MG tablet Take 1 tablet (50 mg total) by mouth every 8 (eight) hours as needed for moderate pain. 06/01/18  Yes Henreitta Leber, Ochoa  cyclobenzaprine (FLEXERIL) 5 MG tablet Take 1 tablet (5 mg total) by mouth 3 (three) times daily as needed for muscle spasms. Patient not taking: Reported on 06/07/2018 06/05/18  Park Liter P, DO    Inpatient Medications: Scheduled Meds: . carvedilol  12.5 mg Oral BID  . famotidine  40 mg Oral QPM  . furosemide  20 mg Oral Daily  . [START ON 06/08/2018] Influenza vac split quadrivalent PF  0.5 mL Intramuscular Tomorrow-1000  . insulin aspart  0-9 Units Subcutaneous TID WC  . insulin glargine  20 Units Subcutaneous Q2200  . lamoTRIgine  25 mg Oral BID  . metoCLOPramide  10 mg Oral TID WC  . pantoprazole  40 mg Oral Daily  . PARoxetine  30 mg Oral Daily  . [START ON 06/08/2018] pneumococcal 23 valent vaccine  0.5 mL Intramuscular Tomorrow-1000  . QUEtiapine  300 mg Oral QHS   Continuous Infusions: . heparin 900 Units/hr (06/07/18 1340)   PRN Meds: acetaminophen **OR** acetaminophen, cyclobenzaprine, dicyclomine, nitroGLYCERIN, ondansetron **OR** ondansetron (ZOFRAN) IV, ondansetron, traMADol  Allergies:    Allergies  Allergen Reactions  . Levothyroxine Rash    Social History:   Social History   Socioeconomic History  . Marital status: Married    Spouse name: Not on file  . Number of children: 2  . Years of education: Not on file  . Highest education level: High school  graduate  Occupational History    Comment: disabled  Social Needs  . Financial resource strain: Not hard at all  . Food insecurity:    Worry: Sometimes true    Inability: Sometimes true  . Transportation needs:    Medical: Yes    Non-medical: Yes  Tobacco Use  . Smoking status: Former Smoker    Types: Cigarettes  . Smokeless tobacco: Never Used  . Tobacco comment: quit about 20 years ago   Substance and Sexual Activity  . Alcohol use: Not Currently  . Drug use: Yes    Frequency: 1.0 times per week    Types: Marijuana  . Sexual activity: Yes    Birth control/protection: Surgical    Comment: one partner  Lifestyle  . Physical activity:    Days per week: 0 days    Minutes per session: 0 min  . Stress: Very much  Relationships  . Social connections:    Talks on phone: Never    Gets together: Twice a week    Attends religious service: Never    Active member of club or organization: No    Attends meetings of clubs or organizations: Never    Relationship status: Married  . Intimate partner violence:    Fear of current or ex partner: No    Emotionally abused: No    Physically abused: No    Forced sexual activity: No  Other Topics Concern  . Not on file  Social History Narrative   Volunteers occasionally     Family History:   Family History  Problem Relation Age of Onset  . Hypertension Mother   . Hyperlipidemia Mother   . Heart disease Mother   . Hypertension Sister   . Asthma Sister   . Heart disease Sister   . Diabetes Sister   . Cancer Sister   . Alzheimer's disease Maternal Grandfather   . Hyperlipidemia Brother   . Asthma Sister   . Hypertension Sister   . Diabetes Sister     ROS:  Please see the history of present illness. All other ROS reviewed and negative.     Physical Exam/Data:   Vitals:   06/07/18 1400 06/07/18 1521 06/07/18 1535 06/07/18 1608  BP: (!) 141/96 (!) 155/105 (!) 139/96 Marland Kitchen)  140/105  Pulse: (!) 101 100 99 95  Resp: (!) 26 20 18  18   Temp:    98 F (36.7 C)  TempSrc:    Oral  SpO2: (!) 88% 100% 97% 97%  Weight:      Height:       No intake or output data in the 24 hours ending 06/07/18 1641 Filed Weights   06/07/18 0746  Weight: 91.4 kg   Body mass index is 34.59 kg/m.  General:  Obese woman, lying comfortably in bed. HEENT: Conjunctival pallor noted.  Low lip piercing noted. Lymph: no adenopathy Neck: JVP 10+cm with positive HJR. Endocrine:  No thryomegaly Vascular: No carotid bruits; FA pulses 2+ bilaterally without bruits  Cardiac:  Distant heart sounds.  RRR with 1/6 systolic murmur.  No rubs or gallops. Lungs:  Fair air movement with bibasilar crackles.  No wheezes. Abd: Soft with mild diffuse tenderness, most pronounced in the epigastrum. Ext: Trace ankle edema. Musculoskeletal:  No deformities, BUE and BLE strength normal and equal Skin: warm and dry  Neuro:  CNs 3-12 intact, no focal abnormalities noted Psych:  Normal affect   EKG:  The EKG was personally reviewed and demonstrates:  NSR with occasional PVC's, LAE, and non-specific T-wave changes. Telemetry:  Telemetry was personally reviewed and demonstrates:  NSR.  Relevant CV Studies: Echo (07/03/17): - Procedure narrative: Transthoracic echocardiography. The study   was technically difficult. - Left ventricle: The cavity size was normal. There was moderate   concentric hypertrophy. Systolic function was severely reduced.   The estimated ejection fraction was in the range of 20% to 25%.   Diffuse hypokinesis. Regional wall motion abnormalities cannot be   excluded. - Left atrium: The atrium was mildly dilated. - Right ventricle: Systolic function was normal. - Pulmonary arteries: Systolic pressure could not be accurately   estimated.  Laboratory Data:  Chemistry Recent Labs  Lab 06/01/18 0445 06/03/18 1347 06/07/18 0752  NA 140 142 137  K 3.3* 4.2 3.6  CL 105 105 105  CO2 27 22 22   GLUCOSE 143* 190* 239*  BUN 13 9 24*    CREATININE 1.14* 0.90 1.26*  CALCIUM 8.3* 9.2 8.8*  GFRNONAA 54* 73 47*  GFRAA >60 84 55*  ANIONGAP 8  --  10    Recent Labs  Lab 06/01/18 0445 06/03/18 1347 06/07/18 0752  PROT 7.4 7.9 8.1  ALBUMIN 3.3* 4.3 3.8  AST 108* 27 38  ALT 541* 266* 125*  ALKPHOS 114 133* 103  BILITOT 1.8* 1.0 1.3*   Hematology Recent Labs  Lab 06/07/18 0752  WBC 7.7  RBC 3.05*  HGB 9.0*  HCT 30.4*  MCV 99.7  MCH 29.5  MCHC 29.6*  RDW 16.9*  PLT 286   Cardiac Enzymes Recent Labs  Lab 06/07/18 0752 06/07/18 1126  TROPONINI 0.16* 0.22*   No results for input(s): TROPIPOC in the last 168 hours.  BNPNo results for input(s): BNP, PROBNP in the last 168 hours.  DDimer No results for input(s): DDIMER in the last 168 hours.  Radiology/Studies:  Dg Chest 2 View  Result Date: 06/07/2018 CLINICAL DATA:  Chest pain.  Shortness of breath. EXAM: CHEST - 2 VIEW COMPARISON:  May 31, 2018 FINDINGS: Stable cardiomegaly. Minimal interstitial prominence without overt edema. Mild atelectasis in the left base. Stable AICD device. No other interval change. IMPRESSION: Cardiomegaly and probable mild pulmonary venous congestion. Electronically Signed   By: Dorise Bullion III M.D   On: 06/07/2018 09:00  Assessment and Plan:   NSTEMI Patient presents with recurrent chest pain and was noted to have mild troponin elevation in the ED.  EKG shows non-specific changes.  Given history of catheterization in [place] with no significant CAD around 2015, I am most suspicious for supply-demand mismatch with likely acute on chronic systolic heart failure.  Nonetheless, Bonnie Ochoa has multiple cardiac risk factors and was noted to have aortic atherosclerosis and abdominal CT during recent hospitalization.  Heparin infusion was started in the ED.  It is reasonable to continue heparin infusion.  Continue ASA 81 mg daily and carvedilol 12.5 mg BID.  Though patient has reported negative cath 4 years ago, I think it  would be reasonable to proceed with left and right heart catheterization during this admission, as renal function allows.  Check urine drug screen.  Statin recently held due to abnormal LFT's; I will defer starting at this time.  Acute on chronic systolic heart failure Patient reports symptoms of chronic heart failure and is noted to have elevated JVP and crackles on exam today.  It is possible that some of her abdominal pain, abnormal LFT's, and gallbladder edema noted during recent hospitalization are due to venous congestion in the setting of heart failure (particularly right heart failure).  Repeat echocardiogram.  Begin diuresis with furosemide 40 mg IV daily; goal net negative 1-2 L in 24 hours.  Continue carvedilol.  Anticipate restarting Entresto or ACEI/ARB prior to discharge, though I will defer right now in the setting of mildly elevated creatinine and ongoing diuresis.  I will add low-dose hydralazine and isosorbide dinitrate for BP control and HF therapy.  Hypertension BP not well controlled.  Continue carvedilol.  I will start hydralazine 10 mg TID and isosorbide dinitrate 10 mg TID.  Hopefully, Entresto or ACEI/ARB can be restarted prior to discharge, as renal function allows.  Cirrhosis Mild transaminits and bilirubin elevation noted.  Synthetic function (albumin and INR) are normal.  Continue to hold statin for now.  Treat heart failure, as above, for possible component of congestive hepatopathy.  Further workup/management per internal medicine.  Anemia Chronic and stable since last admission.  Patient denies bleeding.  Close monitoring of hemoglobin with ongoing anticoagulation and antiplatelet therapy.  Further workup per internal medicine.  For questions or updates, please contact [place] Please consult [email or web address] for contact info under [facility] Cardiology.  Signed, Nelva Bush, Ochoa  06/07/2018 4:41 PM

## 2018-06-07 NOTE — Consult Note (Signed)
ANTICOAGULATION CONSULT NOTE - Initial Consult  Pharmacy Consult for heparin infusion Indication: chest pain/ACS  Allergies  Allergen Reactions  . Levothyroxine Rash    Patient Measurements: Height: _0  (162.6 cm) Weight: 197 lb 11.2 oz (89.7 kg) IBW/kg (Calculated) : 54.7 Heparin Dosing Weight: 75.3  Vital Signs: Temp: 97.5 F (36.4 C) (10/26 2024) Temp Source: Oral (10/26 2024) BP: 137/105 (10/26 2026) Pulse Rate: 100 (10/26 2026)  Labs: Recent Labs    06/07/18 0752 06/07/18 1126 06/07/18 1228 06/07/18 1632 06/07/18 1925  HGB 9.0*  --   --   --   --   HCT 30.4*  --   --   --   --   PLT 286  --   --   --   --   APTT  --   --  30  --   --   LABPROT  --   --  14.0  --   --   INR  --   --  1.09  --   --   HEPARINUNFRC  --   --   --   --  0.24*  CREATININE 1.26*  --   --   --   --   TROPONINI 0.16* 0.22*  --  0.14*  --     Estimated Creatinine Clearance: 55.4 mL/min (A) (by C-G formula based on SCr of 1.26 mg/dL (H)).   Medical History: Past Medical History:  Diagnosis Date  . Abdominal pain    a. 05/2018 HIDA scan wnl.  . ADHD   . Arthritis   . Asthma   . Bipolar 1 disorder ([place])   . Chest pain    a. Hx of cath in [place] - reportedly nl; b. 04/2018 MV: EF 22%, fixed dist ant septal, apical, and inferoapical defects - ? scar vs. attenuation. No ischemia.  . Cirrhosis of liver ([place])   . Depression   . Diabetes mellitus without complication ([place])   . Diverticulitis   . HFrEF (heart failure with reduced ejection fraction) ([place])    a. 06/2017 Echo: EF 20-25%, diff HK. Mildly dil LA.  Marland Kitchen Hypertension   . IBS (irritable bowel syndrome)   . Insomnia   . Migraines   . NICM (nonischemic cardiomyopathy) ([place])    a. EF prev 25%; b. 11/2014 s/p SJM Fortify Assura, single lead AICD (ser# [id number]); c. 06/2017 Echo: EF 20-25%.  . OSA (obstructive sleep apnea)   . PTSD (post-traumatic stress disorder)   . Restless leg syndrome   . Sleep apnea   . Vertigo      Medications:  Medications Prior to Admission  Medication Sig Dispense Refill Last Dose  . blood glucose meter kit and supplies KIT Dispense based on patient and insurance preference. Use up to four times daily as directed. (FOR ICD-9 250.00, 250.01). 1 each 0 Taking  . carvedilol (COREG) 12.5 MG tablet Take 1 tablet (12.5 mg total) by mouth 2 (two) times daily. 180 tablet 3 06/06/2018 at Unknown time  . dicyclomine (BENTYL) 20 MG tablet Take 1 tablet (20 mg total) by mouth 3 (three) times daily as needed for spasms. 90 tablet 2 PRN at PRN  . famotidine (PEPCID) 40 MG tablet Take 1 tablet (40 mg total) by mouth every evening. 90 tablet 1 06/06/2018 at Unknown time  . furosemide (LASIX) 20 MG tablet Take 1 tablet (20 mg) by mouth once a day as needed for weight gain of 2 lbs. (Patient taking differently: Take 20 mg by  mouth daily. Take 1 tablet (20 mg) by mouth once a day as needed for weight gain of 2 lbs.) 90 tablet 3 06/06/2018 at Unknown time  . Insulin Glargine (LANTUS SOLOSTAR) 100 UNIT/ML Solostar Pen Inject 20 Units into the skin daily at 10 pm. 5 pen 12 06/06/2018 at Unknown time  . Insulin Pen Needle 32G X 6 MM MISC 1 each by Does not apply route daily. 100 each 12 Taking  . lamoTRIgine (LAMICTAL) 25 MG tablet Take 1 tablet (25 mg total) by mouth 2 (two) times daily. 180 tablet 1 06/06/2018 at Unknown time  . metoCLOPramide (REGLAN) 10 MG tablet Take 1 tablet (10 mg total) by mouth 3 (three) times daily with meals. 90 tablet 1 06/06/2018 at Unknown time  . nitroGLYCERIN (NITROSTAT) 0.4 MG SL tablet Place 1 tablet (0.4 mg total) under the tongue every 5 (five) minutes as needed for chest pain. 30 tablet 0 06/07/2018 at 0500  . ondansetron (ZOFRAN ODT) 4 MG disintegrating tablet Take 1 tablet (4 mg total) by mouth every 8 (eight) hours as needed for nausea or vomiting. 20 tablet 0 PRN at PRN  . pantoprazole (PROTONIX) 40 MG tablet Take 1 tablet (40 mg total) by mouth daily. 30 tablet 11  06/06/2018 at Unknown time  . PARoxetine (PAXIL) 30 MG tablet TAKE 1 TABLET BY MOUTH EVERY DAY 90 tablet 1 06/06/2018 at Unknown time  . QUEtiapine (SEROQUEL) 300 MG tablet TAKE 1 TABLET BY MOUTH EVERYDAY AT BEDTIME (Patient taking differently: Take 300 mg by mouth at bedtime. ) 90 tablet 1 06/06/2018 at Unknown time  . sucralfate (CARAFATE) 1 g tablet Take 1 tablet (1 g total) by mouth 4 (four) times daily. 60 tablet 3 PRN at PRN  . traMADol (ULTRAM) 50 MG tablet Take 1 tablet (50 mg total) by mouth every 8 (eight) hours as needed for moderate pain. 30 tablet 0 PRN at PRN  . cyclobenzaprine (FLEXERIL) 5 MG tablet Take 1 tablet (5 mg total) by mouth 3 (three) times daily as needed for muscle spasms. (Patient not taking: Reported on 06/07/2018) 60 tablet 1 Not Taking at Unknown time    Assessment: 52 y.o. female with a past medical history of asthma, bipolar, chest pain status post AICD, hypertension, presents to the emergency department with complaints of chest pain and abdominal pain. Prior admission approximately 1.5 weeks ago had a fairly significant work-up performed at that time including a HIDA scan to help rule out gallbladder pathology. No listed history of PTA anticoagulation.  Goal of Therapy:  Heparin level 0.3-0.7 units/ml Monitor platelets by anticoagulation protocol: Yes   Plan:  10/26 1925 heparin level 0.24 Will give additional bolus of 1100 units and increase rate to 1050 units/hr. Will draw heparin level every 6 hours until 2 consecutive therapeutic levels and then daily and CBC daily. Continue to monitor H&H and platelets  Forrest Moron, PharmD 06/07/2018,8:45 PM

## 2018-06-07 NOTE — ED Notes (Signed)
Pt O2 sat dropping while pt sleeping. Pt states that she wears Cpap at home while sleeping. Pt placed on 2 Liter via [place]

## 2018-06-07 NOTE — Consult Note (Signed)
ANTICOAGULATION CONSULT NOTE - Initial Consult  Pharmacy Consult for heparin infusion Indication: chest pain/ACS  Allergies  Allergen Reactions  . Levothyroxine Rash    Patient Measurements: Height: 5\' 4"  (162.6 cm) Weight: 201 lb 8 oz (91.4 kg) IBW/kg (Calculated) : 54.7 Heparin Dosing Weight: 75.3  Vital Signs: Temp: 97.8 F (36.6 C) (10/26 0746) Temp Source: Oral (10/26 0746) BP: 137/107 (10/26 1200) Pulse Rate: 97 (10/26 1200)  Labs: Recent Labs    06/07/18 0752 06/07/18 1126  HGB 9.0*  --   HCT 30.4*  --   PLT 286  --   CREATININE 1.26*  --   TROPONINI 0.16* 0.22*    Estimated Creatinine Clearance: 55.9 mL/min (A) (by C-G formula based on SCr of 1.26 mg/dL (H)).   Medical History: Past Medical History:  Diagnosis Date  . Abdominal pain    a. 05/2018 HIDA scan wnl.  . ADHD   . Arthritis   . Asthma   . Bipolar 1 disorder ([facility])   . Chest pain    a. Hx of cath in [place] - reportedly nl; b. 04/2018 MV: EF 22%, fixed dist ant septal, apical, and inferoapical defects - ? scar vs. attenuation. No ischemia.  . Cirrhosis of liver ([facility])   . Depression   . Diabetes mellitus without complication ([facility])   . Diverticulitis   . HFrEF (heart failure with reduced ejection fraction) ([facility])    a. 06/2017 Echo: EF 20-25%, diff HK. Mildly dil LA.  Marland Kitchen Hypertension   . IBS (irritable bowel syndrome)   . Insomnia   . Migraines   . NICM (nonischemic cardiomyopathy) ([facility])    a. EF prev 25%; b. 11/2014 s/p SJM Fortify Assura, single lead AICD (ser# [id number]); c. 06/2017 Echo: EF 20-25%.  . OSA (obstructive sleep apnea)   . PTSD (post-traumatic stress disorder)   . Restless leg syndrome   . Sleep apnea   . Vertigo     Medications:   (Not in a hospital admission)  Assessment: 52 y.o. female with a past medical history of asthma, bipolar, chest pain status post AICD, hypertension, presents to the emergency department with complaints of chest pain and abdominal pain. Prior  admission approximately 1.5 weeks ago had a fairly significant work-up performed at that time including a HIDA scan to help rule out gallbladder pathology. No listed history of PTA anticoagulation.  Goal of Therapy:  Heparin level 0.3-0.7 units/ml Monitor platelets by anticoagulation protocol: Yes   Plan:  Give 4000 units bolus x 1 Start heparin infusion at 900 units/hr Will draw heparin level every 6 hours until 2 consecutive therapeutic levels and then daily and CBC daily. Continue to monitor H&H and platelets  Orinda Kenner, PharmD 06/07/2018,12:19 PM

## 2018-06-07 NOTE — ED Notes (Signed)
Heparin administration verified by Jearld Lesch RN

## 2018-06-07 NOTE — Progress Notes (Signed)
Family Meeting Note  Advance Directive:yes  Today a meeting took place with the pt in ER patient has history of non-ischemic cardiomyopathy. Hypertension and bipolar disorder comes in with chest pain and shortness of breath but dizziness. She has troponin of .22. She is being admitted with unstable angina with cardiology consultation pending. Discuss code status patient wants to be full code.   Time spent during discussion: 16 mins Enedina Finner, MD

## 2018-06-07 NOTE — Progress Notes (Signed)
Pt c/o chest pain 8/10 not relieved by tramadol. 2 x SL nitro given. Pt states pain is gone. Will continue to monitor

## 2018-06-08 ENCOUNTER — Inpatient Hospital Stay (HOSPITAL_COMMUNITY)
Admit: 2018-06-08 | Discharge: 2018-06-08 | Disposition: A | Discharge status: Home / Self Care | Payer: Medicare Other | Attending: Internal Medicine | Admitting: Internal Medicine

## 2018-06-08 DIAGNOSIS — I5030 Unspecified diastolic (congestive) heart failure: Secondary | ICD-10-CM

## 2018-06-08 LAB — GLUCOSE, CAPILLARY
GLUCOSE-CAPILLARY: 162 mg/dL — AB (ref 70–99)
Glucose-Capillary: 196 mg/dL — ABNORMAL HIGH (ref 70–99)
Glucose-Capillary: 275 mg/dL — ABNORMAL HIGH (ref 70–99)
Glucose-Capillary: 225 mg/dL — ABNORMAL HIGH (ref 70–99)

## 2018-06-08 LAB — BASIC METABOLIC PANEL WITH GFR
Anion gap: 6 (ref 5–15)
BUN: 19 mg/dL (ref 6–20)
CO2: 26 mmol/L (ref 22–32)
Calcium: 8.6 mg/dL — ABNORMAL LOW (ref 8.9–10.3)
Chloride: 108 mmol/L (ref 98–111)
Creatinine, Ser: 1.11 mg/dL — ABNORMAL HIGH (ref 0.44–1.00)
GFR calc Af Amer: >60 mL/min
GFR calc non Af Amer: 55 mL/min — ABNORMAL LOW
Glucose, Bld: 222 mg/dL — ABNORMAL HIGH (ref 70–99)
Potassium: 3.5 mmol/L (ref 3.5–5.1)
Sodium: 140 mmol/L (ref 135–145)

## 2018-06-08 LAB — TROPONIN I
Troponin I: 0.12 ng/mL
Troponin I: 0.11 ng/mL

## 2018-06-08 LAB — CBC
HCT: 30.6 % — ABNORMAL LOW (ref 36.0–46.0)
Hemoglobin: 9.1 g/dL — ABNORMAL LOW (ref 12.0–15.0)
MCH: 29.6 pg (ref 26.0–34.0)
MCHC: 29.7 g/dL — ABNORMAL LOW (ref 30.0–36.0)
MCV: 99.7 fL (ref 80.0–100.0)
Platelets: 291 K/uL (ref 150–400)
RBC: 3.07 MIL/uL — ABNORMAL LOW (ref 3.87–5.11)
RDW: 17.2 % — ABNORMAL HIGH (ref 11.5–15.5)
WBC: 7 K/uL (ref 4.0–10.5)
nRBC: 0 % (ref 0.0–0.2)

## 2018-06-08 LAB — HEPARIN LEVEL (UNFRACTIONATED)
HEPARIN UNFRACTIONATED: 0.24 [IU]/mL — AB (ref 0.30–0.70)
HEPARIN UNFRACTIONATED: 0.52 [IU]/mL (ref 0.30–0.70)
Heparin Unfractionated: 0.22 [IU]/mL — ABNORMAL LOW (ref 0.30–0.70)

## 2018-06-08 LAB — ECHOCARDIOGRAM COMPLETE
Height: 64 in
Weight: 3163.2 [oz_av]

## 2018-06-08 MED ORDER — SODIUM CHLORIDE 0.9 % IV SOLN
INTRAVENOUS | Status: DC
  Administered 2018-06-09: 06:00:00 via INTRAVENOUS

## 2018-06-08 MED ORDER — SODIUM CHLORIDE 0.9 % IV SOLN: 250.0000 mL | INTRAVENOUS | Status: DC | PRN

## 2018-06-08 MED ORDER — SODIUM CHLORIDE 0.9% FLUSH: 3.0000 mL | INTRAVENOUS | Status: DC | PRN

## 2018-06-08 MED ORDER — ASPIRIN 81 MG PO CHEW
81.0000 mg | CHEWABLE_TABLET | ORAL | Status: AC
  Administered 2018-06-09: 81 mg via ORAL
  Filled 2018-06-08: qty 1

## 2018-06-08 MED ORDER — HEPARIN BOLUS VIA INFUSION
1100.0000 [IU] | Freq: Once | INTRAVENOUS | Status: AC
  Administered 2018-06-08: 1100 [IU] via INTRAVENOUS
  Filled 2018-06-08: qty 1100

## 2018-06-08 MED ORDER — INSULIN GLARGINE 100 UNIT/ML ~~LOC~~ SOLN
10.0000 [IU] | Freq: Every day | SUBCUTANEOUS | Status: DC
  Administered 2018-06-08: 10 [IU] via SUBCUTANEOUS
  Filled 2018-06-08 (×2): qty 0.1

## 2018-06-08 MED ORDER — SODIUM CHLORIDE 0.9% FLUSH: 3.0000 mL | Freq: Two times a day (BID) | INTRAVENOUS | Status: DC

## 2018-06-08 MED ORDER — POTASSIUM CHLORIDE CRYS ER 20 MEQ PO TBCR
40.0000 meq | EXTENDED_RELEASE_TABLET | ORAL | Status: AC
  Administered 2018-06-08 (×2): 40 meq via ORAL
  Filled 2018-06-08 (×2): qty 2

## 2018-06-08 NOTE — Plan of Care (Signed)
Patient c/o pain coming and going throughout shift, medications given. Remains on heparin gtt.  Patient was shown cardiac cath video, consent signed for procedure. Will continue to monitor.   Problem: Education: Goal: Knowledge of General Education information will improve Description Including pain rating scale, medication(s)/side effects and non-pharmacologic comfort measures Outcome: Progressing   Problem: Nutrition: Goal: Adequate nutrition will be maintained Outcome: Progressing   Problem: Pain Managment: Goal: General experience of comfort will improve Outcome: Not Progressing   Problem: Safety: Goal: Ability to remain free from injury will improve Outcome: Progressing   Problem: Education: Goal: Understanding of CV disease, CV risk reduction, and recovery process will improve Outcome: Progressing Goal: Individualized Educational Video(s) Outcome: Progressing   Problem: Education: Goal: Individualized Educational Video(s) Outcome: Progressing

## 2018-06-08 NOTE — Progress Notes (Signed)
*  PRELIMINARY RESULTS* Echocardiogram 2D Echocardiogram has been performed.  Bonnie Ochoa 06/08/2018, 1:43 PM

## 2018-06-08 NOTE — H&P (View-Only) (Signed)
Progress Note  Patient Name: Bonnie Ochoa Date of Encounter: 06/08/2018  Primary Cardiologist: Julien Nordmann, MD   Subjective   Feeling okay this morning but woke up with chest pain overnight.  Noted shortness of breath walking to the bathroom.  Abdominal pain/distention a little better today.  Inpatient Medications    Scheduled Meds: . carvedilol  12.5 mg Oral BID  . famotidine  40 mg Oral QPM  . furosemide  40 mg Intravenous Daily  . hydrALAZINE  10 mg Oral TID  . Influenza vac split quadrivalent PF  0.5 mL Intramuscular Tomorrow-1000  . insulin aspart  0-9 Units Subcutaneous TID WC  . insulin glargine  20 Units Subcutaneous Q2200  . isosorbide dinitrate  10 mg Oral TID  . lamoTRIgine  25 mg Oral BID  . metoCLOPramide  10 mg Oral TID WC  . pantoprazole  40 mg Oral Daily  . PARoxetine  30 mg Oral Daily  . QUEtiapine  300 mg Oral QHS  . sodium chloride flush  3 mL Intravenous Q12H  . sodium chloride flush  3 mL Intravenous Q12H   Continuous Infusions: . heparin 1,200 Units/hr (06/08/18 0859)   PRN Meds: acetaminophen **OR** acetaminophen, cyclobenzaprine, dicyclomine, nitroGLYCERIN, ondansetron **OR** ondansetron (ZOFRAN) IV, ondansetron, sodium chloride flush, traMADol   Vital Signs    Vitals:   06/07/18 2154 06/07/18 2159 06/08/18 0514 06/08/18 0834  BP: (!) 146/101 (!) 134/99 110/73 (!) 126/98  Pulse: 98 98 89 89  Resp:   18   Temp:   98 F (36.7 C) (!) 97.4 F (36.3 C)  TempSrc:    Oral  SpO2:   94% 99%  Weight:      Height:        Intake/Output Summary (Last 24 hours) at 06/08/2018 1023 Last data filed at 06/08/2018 0859 Gross per 24 hour  Intake 491.83 ml  Output 1000 ml  Net -508.17 ml   Filed Weights   06/07/18 0746 06/07/18 1618  Weight: 91.4 kg 89.7 kg    Telemetry    NSR with isolated PVC's. - Personally Reviewed  ECG    No new tracing.  Physical Exam   GEN: No acute distress.  Sitting in bed, eating breakfast.   Neck: No  JVD Cardiac: RRR, no murmurs, rubs, or gallops.  Respiratory: Mildly diminished breath sounds at the lung bases.  No wheezes or crackles. GI: Mildly distended and diffusely tender. MS: No edema; No deformity. Neuro:  Nonfocal  Psych: Normal affect   Labs    Chemistry Recent Labs  Lab 06/03/18 1347 06/07/18 0752 06/08/18 0506  NA 142 137 140  K 4.2 3.6 3.5  CL 105 105 108  CO2 22 22 26   GLUCOSE 190* 239* 222*  BUN 9 24* 19  CREATININE 0.90 1.26* 1.11*  CALCIUM 9.2 8.8* 8.6*  PROT 7.9 8.1  --   ALBUMIN 4.3 3.8  --   AST 27 38  --   ALT 266* 125*  --   ALKPHOS 133* 103  --   BILITOT 1.0 1.3*  --   GFRNONAA 73 47* 55*  GFRAA 84 55* >60  ANIONGAP  --  10 6     Hematology Recent Labs  Lab 06/07/18 0752 06/08/18 0506  WBC 7.7 7.0  RBC 3.05* 3.07*  HGB 9.0* 9.1*  HCT 30.4* 30.6*  MCV 99.7 99.7  MCH 29.5 29.6  MCHC 29.6* 29.7*  RDW 16.9* 17.2*  PLT 286 291    Cardiac Enzymes Recent Labs  Lab 06/07/18 1126 06/07/18 1632 06/07/18 2201 06/08/18 0506  TROPONINI 0.22* 0.14* 0.12* 0.11*   No results for input(s): TROPIPOC in the last 168 hours.   BNPNo results for input(s): BNP, PROBNP in the last 168 hours.   DDimer No results for input(s): DDIMER in the last 168 hours.   Radiology    Dg Chest 2 View  Result Date: 06/07/2018 CLINICAL DATA:  Chest pain.  Shortness of breath. EXAM: CHEST - 2 VIEW COMPARISON:  May 31, 2018 FINDINGS: Stable cardiomegaly. Minimal interstitial prominence without overt edema. Mild atelectasis in the left base. Stable AICD device. No other interval change. IMPRESSION: Cardiomegaly and probable mild pulmonary venous congestion. Electronically Signed   By: Gerome Sam III M.D   On: 06/07/2018 09:00    Cardiac Studies   Echo (07/03/17): - Procedure narrative: Transthoracic echocardiography. The study was technically difficult. - Left ventricle: The cavity size was normal. There was moderate concentric hypertrophy.  Systolic function was severely reduced. The estimated ejection fraction was in the range of 20% to 25%. Diffuse hypokinesis. Regional wall motion abnormalities cannot be excluded. - Left atrium: The atrium was mildly dilated. - Right ventricle: Systolic function was normal. - Pulmonary arteries: Systolic pressure could not be accurately estimated.  Patient Profile     52 y.o. female with a hx of chronic systolic heart failure due to non-ischemic cardiomyopathy, moderate mitral regurgitation, pulmonary hypertension, bipolar disorder, hepatic cirrhosis, hypertension, diabetes mellitus, obstructive sleep apnea, chronic anemia, and polysubstance abuse, admitted with chest pain and mild troponin elevation in the setting of acute on chronic systolic heart failure.  Assessment & Plan    NSTEMI Patient reports recurrent chest pain, though I am more suspicious for demand ischemia in the setting of acute on chronic systolic heart failure.  Troponin peaked at 0.22 yesterday.  She is currently chest pain free.  Continue heparin infusion.  Continue ASA 81 mg daily and carvedilol 12.5 mg BID.  Isosorbide dinitrate added yesterday; continue current dose.  Plan for right and left heart catheterization tomorrow.  I have reviewed the risks, indications, and alternatives to cardiac catheterization, possible angioplasty, and stenting with the patient. Risks include but are not limited to bleeding, infection, vascular injury, stroke, myocardial infection, arrhythmia, kidney injury, radiation-related injury in the case of prolonged fluoroscopy use, emergency cardiac surgery, and death. The patient understands the risks of serious complication is 1-2 in 1000 with diagnostic cardiac cath and 1-2% or less with angioplasty/stenting.  Acute on chronic systolic heart failure Volume status appears better today, though patient still reports orthopnea and significant exertional dyspnea.  She was net even, though  I suspect I/O's are incompletely recorded, as weight is down almost 4 pounds.  Renal function slightly better with diuresis.  Furosemide 40 mg IV given this morning; no additional diuretic tonight or tomorrow morning in anticipation of catheterization.  Continue current doses of carvedilol, hydralazine, and isosorbide dinitrate.  Anticipate restarting ARB/ARNI following catheterization, as renal function allows.  L/RHC catheterization tomorrow.  Echocardiogram pending.  Hypertension BP improved, though diastolic is still somewhat elevated.  Continue current doses of carvedilol, hydralazine, and isosorbide dinitrate.  Hopefully, can restart Entresto or ARB after catheterization, as renal function allows.  Anemia Hemoglobin stable and likely multifactorial.  Continue to monitor closely, given ongoing anticoagulation with heparin and plan for catheterization tomorrow.    For questions or updates, please contact [facility] Please consult [email or web address] for contact info under [facility].     Signed, Yvonne Kendall, MD  06/08/2018, 10:23 AM

## 2018-06-08 NOTE — Progress Notes (Signed)
 Progress Note  Patient Name: Bonnie Ochoa Date of Encounter: 06/08/2018  Primary Cardiologist: Timothy Gollan, MD   Subjective   Feeling okay this morning but woke up with chest pain overnight.  Noted shortness of breath walking to the bathroom.  Abdominal pain/distention a little better today.  Inpatient Medications    Scheduled Meds: . carvedilol  12.5 mg Oral BID  . famotidine  40 mg Oral QPM  . furosemide  40 mg Intravenous Daily  . hydrALAZINE  10 mg Oral TID  . Influenza vac split quadrivalent PF  0.5 mL Intramuscular Tomorrow-1000  . insulin aspart  0-9 Units Subcutaneous TID WC  . insulin glargine  20 Units Subcutaneous Q2200  . isosorbide dinitrate  10 mg Oral TID  . lamoTRIgine  25 mg Oral BID  . metoCLOPramide  10 mg Oral TID WC  . pantoprazole  40 mg Oral Daily  . PARoxetine  30 mg Oral Daily  . QUEtiapine  300 mg Oral QHS  . sodium chloride flush  3 mL Intravenous Q12H  . sodium chloride flush  3 mL Intravenous Q12H   Continuous Infusions: . heparin 1,200 Units/hr (06/08/18 0859)   PRN Meds: acetaminophen **OR** acetaminophen, cyclobenzaprine, dicyclomine, nitroGLYCERIN, ondansetron **OR** ondansetron (ZOFRAN) IV, ondansetron, sodium chloride flush, traMADol   Vital Signs    Vitals:   06/07/18 2154 06/07/18 2159 06/08/18 0514 06/08/18 0834  BP: (!) 146/101 (!) 134/99 110/73 (!) 126/98  Pulse: 98 98 89 89  Resp:   18   Temp:   98 F (36.7 C) (!) 97.4 F (36.3 C)  TempSrc:    Oral  SpO2:   94% 99%  Weight:      Height:        Intake/Output Summary (Last 24 hours) at 06/08/2018 1023 Last data filed at 06/08/2018 0859 Gross per 24 hour  Intake 491.83 ml  Output 1000 ml  Net -508.17 ml   Filed Weights   06/07/18 0746 06/07/18 1618  Weight: 91.4 kg 89.7 kg    Telemetry    NSR with isolated PVC's. - Personally Reviewed  ECG    No new tracing.  Physical Exam   GEN: No acute distress.  Sitting in bed, eating breakfast.   Neck: No  JVD Cardiac: RRR, no murmurs, rubs, or gallops.  Respiratory: Mildly diminished breath sounds at the lung bases.  No wheezes or crackles. GI: Mildly distended and diffusely tender. MS: No edema; No deformity. Neuro:  Nonfocal  Psych: Normal affect   Labs    Chemistry Recent Labs  Lab 06/03/18 1347 06/07/18 0752 06/08/18 0506  NA 142 137 140  K 4.2 3.6 3.5  CL 105 105 108  CO2 22 22 26  GLUCOSE 190* 239* 222*  BUN 9 24* 19  CREATININE 0.90 1.26* 1.11*  CALCIUM 9.2 8.8* 8.6*  PROT 7.9 8.1  --   ALBUMIN 4.3 3.8  --   AST 27 38  --   ALT 266* 125*  --   ALKPHOS 133* 103  --   BILITOT 1.0 1.3*  --   GFRNONAA 73 47* 55*  GFRAA 84 55* >60  ANIONGAP  --  10 6     Hematology Recent Labs  Lab 06/07/18 0752 06/08/18 0506  WBC 7.7 7.0  RBC 3.05* 3.07*  HGB 9.0* 9.1*  HCT 30.4* 30.6*  MCV 99.7 99.7  MCH 29.5 29.6  MCHC 29.6* 29.7*  RDW 16.9* 17.2*  PLT 286 291    Cardiac Enzymes Recent Labs    Lab 06/07/18 1126 06/07/18 1632 06/07/18 2201 06/08/18 0506  TROPONINI 0.22* 0.14* 0.12* 0.11*   No results for input(s): TROPIPOC in the last 168 hours.   BNPNo results for input(s): BNP, PROBNP in the last 168 hours.   DDimer No results for input(s): DDIMER in the last 168 hours.   Radiology    Dg Chest 2 View  Result Date: 06/07/2018 CLINICAL DATA:  Chest pain.  Shortness of breath. EXAM: CHEST - 2 VIEW COMPARISON:  May 31, 2018 FINDINGS: Stable cardiomegaly. Minimal interstitial prominence without overt edema. Mild atelectasis in the left base. Stable AICD device. No other interval change. IMPRESSION: Cardiomegaly and probable mild pulmonary venous congestion. Electronically Signed   By: David  Williams III M.D   On: 06/07/2018 09:00    Cardiac Studies   Echo (07/03/17): - Procedure narrative: Transthoracic echocardiography. The study was technically difficult. - Left ventricle: The cavity size was normal. There was moderate concentric hypertrophy.  Systolic function was severely reduced. The estimated ejection fraction was in the range of 20% to 25%. Diffuse hypokinesis. Regional wall motion abnormalities cannot be excluded. - Left atrium: The atrium was mildly dilated. - Right ventricle: Systolic function was normal. - Pulmonary arteries: Systolic pressure could not be accurately estimated.  Patient Profile     52 y.o. female with a hx of chronic systolic heart failure due to non-ischemic cardiomyopathy, moderate mitral regurgitation, pulmonary hypertension, bipolar disorder, hepatic cirrhosis, hypertension, diabetes mellitus, obstructive sleep apnea, chronic anemia, and polysubstance abuse, admitted with chest pain and mild troponin elevation in the setting of acute on chronic systolic heart failure.  Assessment & Plan    NSTEMI Patient reports recurrent chest pain, though I am more suspicious for demand ischemia in the setting of acute on chronic systolic heart failure.  Troponin peaked at 0.22 yesterday.  She is currently chest pain free.  Continue heparin infusion.  Continue ASA 81 mg daily and carvedilol 12.5 mg BID.  Isosorbide dinitrate added yesterday; continue current dose.  Plan for right and left heart catheterization tomorrow.  I have reviewed the risks, indications, and alternatives to cardiac catheterization, possible angioplasty, and stenting with the patient. Risks include but are not limited to bleeding, infection, vascular injury, stroke, myocardial infection, arrhythmia, kidney injury, radiation-related injury in the case of prolonged fluoroscopy use, emergency cardiac surgery, and death. The patient understands the risks of serious complication is 1-2 in 1000 with diagnostic cardiac cath and 1-2% or less with angioplasty/stenting.  Acute on chronic systolic heart failure Volume status appears better today, though patient still reports orthopnea and significant exertional dyspnea.  She was net even, though  I suspect I/O's are incompletely recorded, as weight is down almost 4 pounds.  Renal function slightly better with diuresis.  Furosemide 40 mg IV given this morning; no additional diuretic tonight or tomorrow morning in anticipation of catheterization.  Continue current doses of carvedilol, hydralazine, and isosorbide dinitrate.  Anticipate restarting ARB/ARNI following catheterization, as renal function allows.  L/RHC catheterization tomorrow.  Echocardiogram pending.  Hypertension BP improved, though diastolic is still somewhat elevated.  Continue current doses of carvedilol, hydralazine, and isosorbide dinitrate.  Hopefully, can restart Entresto or ARB after catheterization, as renal function allows.  Anemia Hemoglobin stable and likely multifactorial.  Continue to monitor closely, given ongoing anticoagulation with heparin and plan for catheterization tomorrow.    For questions or updates, please contact [facility] Please consult [email or web address] for contact info under [facility] Cardiology.     Signed, Mattisen Pohlmann, MD    06/08/2018, 10:23 AM    

## 2018-06-08 NOTE — Consult Note (Signed)
[place] for heparin infusion Indication: chest pain/ACS  Allergies  Allergen Reactions  . Levothyroxine Rash    Patient Measurements: Height: _0  (162.6 cm) Weight: 197 lb 11.2 oz (89.7 kg) IBW/kg (Calculated) : 54.7 Heparin Dosing Weight: 75.3  Vital Signs: Temp: 97.4 F (36.3 C) (10/27 0834) Temp Source: Oral (10/27 0834) BP: 126/98 (10/27 0834) Pulse Rate: 89 (10/27 0834)  Labs: Recent Labs    06/07/18 0752  06/07/18 1228 06/07/18 1632 06/07/18 1925 06/07/18 2201 06/08/18 0506 06/08/18 1308  HGB 9.0*  --   --   --   --   --  9.1*  --   HCT 30.4*  --   --   --   --   --  30.6*  --   PLT 286  --   --   --   --   --  291  --   APTT  --   --  30  --   --   --   --   --   LABPROT  --   --  14.0  --   --   --   --   --   INR  --   --  1.09  --   --   --   --   --   HEPARINUNFRC  --   --   --   --  0.24*  --  0.22* 0.24*  CREATININE 1.26*  --   --   --   --   --  1.11*  --   TROPONINI 0.16*   < >  --  0.14*  --  0.12* 0.11*  --    < > = values in this interval not displayed.    Estimated Creatinine Clearance: 62.8 mL/min (A) (by C-G formula based on SCr of 1.11 mg/dL (H)).   Medical History: Past Medical History:  Diagnosis Date  . Abdominal pain    a. 05/2018 HIDA scan wnl.  . ADHD   . Arthritis   . Asthma   . Bipolar 1 disorder ([place])   . Chest pain    a. Hx of cath in [place] - reportedly nl; b. 04/2018 MV: EF 22%, fixed dist ant septal, apical, and inferoapical defects - ? scar vs. attenuation. No ischemia.  . Cirrhosis of liver ([place])   . Depression   . Diabetes mellitus without complication ([place])   . Diverticulitis   . HFrEF (heart failure with reduced ejection fraction) ([place])    a. 06/2017 Echo: EF 20-25%, diff HK. Mildly dil LA.  Marland Kitchen Hypertension   . IBS (irritable bowel syndrome)   . Insomnia   . Migraines   . NICM (nonischemic cardiomyopathy) ([place])    a. EF prev 25%; b. 11/2014 s/p SJM Fortify [place], single  lead AICD (ser# [id number]); c. 06/2017 Echo: EF 20-25%.  . OSA (obstructive sleep apnea)   . PTSD (post-traumatic stress disorder)   . Restless leg syndrome   . Sleep apnea   . Vertigo     Medications:  Medications Prior to Admission  Medication Sig Dispense Refill Last Dose  . blood glucose meter kit and supplies KIT Dispense based on patient and insurance preference. Use up to four times daily as directed. (FOR ICD-9 250.00, 250.01). 1 each 0 Taking  . carvedilol (COREG) 12.5 MG tablet Take 1 tablet (12.5 mg total) by mouth 2 (two) times daily. 180 tablet 3 06/06/2018 at Unknown time  . dicyclomine (  BENTYL) 20 MG tablet Take 1 tablet (20 mg total) by mouth 3 (three) times daily as needed for spasms. 90 tablet 2 PRN at PRN  . famotidine (PEPCID) 40 MG tablet Take 1 tablet (40 mg total) by mouth every evening. 90 tablet 1 06/06/2018 at Unknown time  . furosemide (LASIX) 20 MG tablet Take 1 tablet (20 mg) by mouth once a day as needed for weight gain of 2 lbs. (Patient taking differently: Take 20 mg by mouth daily. Take 1 tablet (20 mg) by mouth once a day as needed for weight gain of 2 lbs.) 90 tablet 3 06/06/2018 at Unknown time  . Insulin Glargine (LANTUS SOLOSTAR) 100 UNIT/ML Solostar Pen Inject 20 Units into the skin daily at 10 pm. 5 pen 12 06/06/2018 at Unknown time  . Insulin Pen Needle 32G X 6 MM MISC 1 each by Does not apply route daily. 100 each 12 Taking  . lamoTRIgine (LAMICTAL) 25 MG tablet Take 1 tablet (25 mg total) by mouth 2 (two) times daily. 180 tablet 1 06/06/2018 at Unknown time  . metoCLOPramide (REGLAN) 10 MG tablet Take 1 tablet (10 mg total) by mouth 3 (three) times daily with meals. 90 tablet 1 06/06/2018 at Unknown time  . nitroGLYCERIN (NITROSTAT) 0.4 MG SL tablet Place 1 tablet (0.4 mg total) under the tongue every 5 (five) minutes as needed for chest pain. 30 tablet 0 06/07/2018 at 0500  . ondansetron (ZOFRAN ODT) 4 MG disintegrating tablet Take 1 tablet (4 mg total)  by mouth every 8 (eight) hours as needed for nausea or vomiting. 20 tablet 0 PRN at PRN  . pantoprazole (PROTONIX) 40 MG tablet Take 1 tablet (40 mg total) by mouth daily. 30 tablet 11 06/06/2018 at Unknown time  . PARoxetine (PAXIL) 30 MG tablet TAKE 1 TABLET BY MOUTH EVERY DAY 90 tablet 1 06/06/2018 at Unknown time  . QUEtiapine (SEROQUEL) 300 MG tablet TAKE 1 TABLET BY MOUTH EVERYDAY AT BEDTIME (Patient taking differently: Take 300 mg by mouth at bedtime. ) 90 tablet 1 06/06/2018 at Unknown time  . sucralfate (CARAFATE) 1 g tablet Take 1 tablet (1 g total) by mouth 4 (four) times daily. 60 tablet 3 PRN at PRN  . traMADol (ULTRAM) 50 MG tablet Take 1 tablet (50 mg total) by mouth every 8 (eight) hours as needed for moderate pain. 30 tablet 0 PRN at PRN  . cyclobenzaprine (FLEXERIL) 5 MG tablet Take 1 tablet (5 mg total) by mouth 3 (three) times daily as needed for muscle spasms. (Patient not taking: Reported on 06/07/2018) 60 tablet 1 Not Taking at Unknown time    Assessment: 52 y.o. female with a past medical history of asthma, bipolar, chest pain status post AICD, hypertension, presents to the emergency department with complaints of chest pain and abdominal pain. Prior admission approximately 1.5 weeks ago had a fairly significant work-up performed at that time including a HIDA scan to help rule out gallbladder pathology. No listed history of PTA anticoagulation.  Goal of Therapy:  Heparin level 0.3-0.7 units/ml Monitor platelets by anticoagulation protocol: Yes   Plan:  10/27 @ 0700 HL 0.22 subtherapeutic. Will rebolus w/ heparin 1100 units IV x 1 and increase rate to 1200 units/hr and will recheck HL @ 1300.  10/27 @ 1308 HL=0.24. Will rebolus w/ 1100 units and increase drip to 1350 units/hr. REcheck HL in 6 hours  Chinita Greenland PharmD Clinical Pharmacist 06/08/2018

## 2018-06-08 NOTE — Progress Notes (Signed)
[facility] - [place] at [facility]   PATIENT NAME: Bonnie Ochoa    MR#:  [id number]  DATE OF BIRTH:  11/23/1965  SUBJECTIVE: Patient admitted for chest pain, shortness of breath.  CHIEF COMPLAINT:   Chief Complaint  Patient presents with  . Chest Pain  No chest pain or shortness of breath this morning.  Troponin peaked up to 0.12.  REVIEW OF SYSTEMS:   ROS CONSTITUTIONAL: No fever, fatigue or weakness.  EYES: No blurred or double vision.  EARS, NOSE, AND THROAT: No tinnitus or ear pain.  RESPIRATORY: Chest pain, shortness of breath yesterday cARDIOVASCULAR: No chest pain, orthopnea, edema.  GASTROINTESTINAL: No nausea, vomiting, diarrhea or abdominal pain.  GENITOURINARY: No dysuria, hematuria.  ENDOCRINE: No polyuria, nocturia,  HEMATOLOGY: No anemia, easy bruising or bleeding SKIN: No rash or lesion. MUSCULOSKELETAL: No joint pain or arthritis.   NEUROLOGIC: No tingling, numbness, weakness.  PSYCHIATRY: No anxiety or depression.   DRUG ALLERGIES:   Allergies  Allergen Reactions  . Levothyroxine Rash    VITALS:  Blood pressure (!) 126/98, pulse 89, temperature (!) 97.4 F (36.3 C), temperature source Oral, resp. rate 18, height 5\' 4"  (1.626 m), weight 89.7 kg, SpO2 99 %.  PHYSICAL EXAMINATION:  GENERAL:  52 y.o.-year-old patient lying in the bed with no acute distress.  EYES: Pupils equal, round, reactive to light and accommodation. No scleral icterus. Extraocular muscles intact.  HEENT: Head atraumatic, normocephalic. Oropharynx and nasopharynx clear.  NECK:  Supple, no jugular venous distention. No thyroid enlargement, no tenderness.  LUNGS: Normal breath sounds bilaterally, no wheezing, rales,rhonchi or crepitation. No use of accessory muscles of respiration.  CARDIOVASCULAR: S1, S2 normal. No murmurs, rubs, or gallops.  ABDOMEN: Soft, nontender, nondistended. Bowel sounds present. No organomegaly or mass.  EXTREMITIES: No pedal edema,  cyanosis, or clubbing.  NEUROLOGIC: Cranial nerves II through XII are intact. Muscle strength 5/5 in all extremities. Sensation intact. Gait not checked.  PSYCHIATRIC: The patient is alert and oriented x 3.  SKIN: No obvious rash, lesion, or ulcer.    LABORATORY PANEL:   CBC Recent Labs  Lab 06/08/18 0506  WBC 7.0  HGB 9.1*  HCT 30.6*  PLT 291   ------------------------------------------------------------------------------------------------------------------  Chemistries  Recent Labs  Lab 06/07/18 0752 06/08/18 0506  NA 137 140  K 3.6 3.5  CL 105 108  CO2 22 26  GLUCOSE 239* 222*  BUN 24* 19  CREATININE 1.26* 1.11*  CALCIUM 8.8* 8.6*  AST 38  --   ALT 125*  --   ALKPHOS 103  --   BILITOT 1.3*  --    ------------------------------------------------------------------------------------------------------------------  Cardiac Enzymes Recent Labs  Lab 06/08/18 0506  TROPONINI 0.11*   ------------------------------------------------------------------------------------------------------------------  RADIOLOGY:  Dg Chest 2 View  Result Date: 06/07/2018 CLINICAL DATA:  Chest pain.  Shortness of breath. EXAM: CHEST - 2 VIEW COMPARISON:  May 31, 2018 FINDINGS: Stable cardiomegaly. Minimal interstitial prominence without overt edema. Mild atelectasis in the left base. Stable AICD device. No other interval change. IMPRESSION: Cardiomegaly and probable mild pulmonary venous congestion. Electronically Signed   By: Gerome Sam III M.D   On: 06/07/2018 09:00    EKG:   Orders placed or performed during the hospital encounter of 06/07/18  . EKG 12-Lead  . EKG 12-Lead  . ED EKG within 10 minutes  . ED EKG within 10 minutes    ASSESSMENT AND PLAN:   52 year old female patient seen with chest pain, shortness of breath, found to  have mildly elevated troponins. 1.  Non-ST elevation MI likely due to demand ischemia.  Continue aspirin, Coreg, heparin infusion, possible  cardiac cath tomorrow. 2.  Acute on chronic systolic heart failure, continue IV Lasix, Coreg, hydralazine, Imdur.  Restart ARB's after cardiac cath tomorrow.  3/ essential hypertension: Stable. #4 diabetes mellitus type 2: Decrease Lantus dose by 50% as patient is n.p.o. after midnight for cardiac cath tomorrow.  Bipolar disorder: Patient is on Lamictal.  Paxil, Seroquel.  All the records are reviewed and case discussed with Care Management/Social Workerr. Management plans discussed with the patient, family and they are in agreement.  CODE STATUS: full  TOTAL TIME TAKING CARE OF THIS PATIENT: 40 minutes.   POSSIBLE D/C IN 1-2 DAYS, DEPENDING ON CLINICAL CONDITION.   Katha Hamming M.D on 06/08/2018 at 12:42 PM  Between 7am to 6pm - Pager - [phone]  After 6pm go to [email or web address] - password [email or web address] [email or web address]  [place] [facility]  Office  [phone]  CC: Primary care physician; Dorcas Carrow, DO   Note: This dictation was prepared with Dragon dictation along with smaller phrase technology. Any transcriptional errors that result from this process are unintentional.

## 2018-06-08 NOTE — Consult Note (Signed)
[facility] for heparin infusion Indication: chest pain/ACS  Allergies  Allergen Reactions  . Levothyroxine Rash    Patient Measurements: Height: _0  (162.6 cm) Weight: 197 lb 11.2 oz (89.7 kg) IBW/kg (Calculated) : 54.7 Heparin Dosing Weight: 75.3  Vital Signs: Temp: 98 F (36.7 C) (10/27 2010) Temp Source: Oral (10/27 2010) BP: 129/98 (10/27 2010) Pulse Rate: 94 (10/27 2010)  Labs: Recent Labs    06/07/18 0752  06/07/18 1228 06/07/18 1632  06/07/18 2201 06/08/18 0506 06/08/18 1308 06/08/18 1947  HGB 9.0*  --   --   --   --   --  9.1*  --   --   HCT 30.4*  --   --   --   --   --  30.6*  --   --   PLT 286  --   --   --   --   --  291  --   --   APTT  --   --  30  --   --   --   --   --   --   LABPROT  --   --  14.0  --   --   --   --   --   --   INR  --   --  1.09  --   --   --   --   --   --   HEPARINUNFRC  --   --   --   --    < >  --  0.22* 0.24* 0.52  CREATININE 1.26*  --   --   --   --   --  1.11*  --   --   TROPONINI 0.16*   < >  --  0.14*  --  0.12* 0.11*  --   --    < > = values in this interval not displayed.    Estimated Creatinine Clearance: 62.8 mL/min (A) (by C-G formula based on SCr of 1.11 mg/dL (H)).   Medical History: Past Medical History:  Diagnosis Date  . Abdominal pain    a. 05/2018 HIDA scan wnl.  . ADHD   . Arthritis   . Asthma   . Bipolar 1 disorder ([place])   . Chest pain    a. Hx of cath in [place] - reportedly nl; b. 04/2018 MV: EF 22%, fixed dist ant septal, apical, and inferoapical defects - ? scar vs. attenuation. No ischemia.  . Cirrhosis of liver ([place])   . Depression   . Diabetes mellitus without complication ([place])   . Diverticulitis   . HFrEF (heart failure with reduced ejection fraction) ([place])    a. 06/2017 Echo: EF 20-25%, diff HK. Mildly dil LA.  Marland Kitchen Hypertension   . IBS (irritable bowel syndrome)   . Insomnia   . Migraines   . NICM (nonischemic cardiomyopathy) ([place])    a. EF prev 25%;  b. 11/2014 s/p SJM Fortify Assura, single lead AICD (ser# [id number]); c. 06/2017 Echo: EF 20-25%.  . OSA (obstructive sleep apnea)   . PTSD (post-traumatic stress disorder)   . Restless leg syndrome   . Sleep apnea   . Vertigo     Medications:  Medications Prior to Admission  Medication Sig Dispense Refill Last Dose  . blood glucose meter kit and supplies KIT Dispense based on patient and insurance preference. Use up to four times daily as directed. (FOR ICD-9 250.00, 250.01). 1 each 0 Taking  .  carvedilol (COREG) 12.5 MG tablet Take 1 tablet (12.5 mg total) by mouth 2 (two) times daily. 180 tablet 3 06/06/2018 at Unknown time  . dicyclomine (BENTYL) 20 MG tablet Take 1 tablet (20 mg total) by mouth 3 (three) times daily as needed for spasms. 90 tablet 2 PRN at PRN  . famotidine (PEPCID) 40 MG tablet Take 1 tablet (40 mg total) by mouth every evening. 90 tablet 1 06/06/2018 at Unknown time  . furosemide (LASIX) 20 MG tablet Take 1 tablet (20 mg) by mouth once a day as needed for weight gain of 2 lbs. (Patient taking differently: Take 20 mg by mouth daily. Take 1 tablet (20 mg) by mouth once a day as needed for weight gain of 2 lbs.) 90 tablet 3 06/06/2018 at Unknown time  . Insulin Glargine (LANTUS SOLOSTAR) 100 UNIT/ML Solostar Pen Inject 20 Units into the skin daily at 10 pm. 5 pen 12 06/06/2018 at Unknown time  . Insulin Pen Needle 32G X 6 MM MISC 1 each by Does not apply route daily. 100 each 12 Taking  . lamoTRIgine (LAMICTAL) 25 MG tablet Take 1 tablet (25 mg total) by mouth 2 (two) times daily. 180 tablet 1 06/06/2018 at Unknown time  . metoCLOPramide (REGLAN) 10 MG tablet Take 1 tablet (10 mg total) by mouth 3 (three) times daily with meals. 90 tablet 1 06/06/2018 at Unknown time  . nitroGLYCERIN (NITROSTAT) 0.4 MG SL tablet Place 1 tablet (0.4 mg total) under the tongue every 5 (five) minutes as needed for chest pain. 30 tablet 0 06/07/2018 at 0500  . ondansetron (ZOFRAN ODT) 4 MG  disintegrating tablet Take 1 tablet (4 mg total) by mouth every 8 (eight) hours as needed for nausea or vomiting. 20 tablet 0 PRN at PRN  . pantoprazole (PROTONIX) 40 MG tablet Take 1 tablet (40 mg total) by mouth daily. 30 tablet 11 06/06/2018 at Unknown time  . PARoxetine (PAXIL) 30 MG tablet TAKE 1 TABLET BY MOUTH EVERY DAY 90 tablet 1 06/06/2018 at Unknown time  . QUEtiapine (SEROQUEL) 300 MG tablet TAKE 1 TABLET BY MOUTH EVERYDAY AT BEDTIME (Patient taking differently: Take 300 mg by mouth at bedtime. ) 90 tablet 1 06/06/2018 at Unknown time  . sucralfate (CARAFATE) 1 g tablet Take 1 tablet (1 g total) by mouth 4 (four) times daily. 60 tablet 3 PRN at PRN  . traMADol (ULTRAM) 50 MG tablet Take 1 tablet (50 mg total) by mouth every 8 (eight) hours as needed for moderate pain. 30 tablet 0 PRN at PRN  . cyclobenzaprine (FLEXERIL) 5 MG tablet Take 1 tablet (5 mg total) by mouth 3 (three) times daily as needed for muscle spasms. (Patient not taking: Reported on 06/07/2018) 60 tablet 1 Not Taking at Unknown time    Assessment: 52 y.o. female with a past medical history of asthma, bipolar, chest pain status post AICD, hypertension, presents to the emergency department with complaints of chest pain and abdominal pain. Prior admission approximately 1.5 weeks ago had a fairly significant work-up performed at that time including a HIDA scan to help rule out gallbladder pathology. No listed history of PTA anticoagulation.  Goal of Therapy:  Heparin level 0.3-0.7 units/ml Monitor platelets by anticoagulation protocol: Yes   Plan:  10/27 @ 0700 HL 0.22 subtherapeutic. Will rebolus w/ heparin 1100 units IV x 1 and increase rate to 1200 units/hr and will recheck HL @ 1300.  10/27 @ 1308 HL=0.24. Will rebolus w/ 1100 units and increase drip to  1350 units/hr. REcheck HL in 6 hours  10/27 @ 1947 heparin level 0.52.  Will continue current rate of 1350 units/hr.  Will recheck heparin level in 6 hours.  Evelena Asa, PharmD Clinical Pharmacist 06/08/2018

## 2018-06-08 NOTE — Consult Note (Signed)
ANTICOAGULATION CONSULT NOTE - Initial Consult  Pharmacy Consult for heparin infusion Indication: chest pain/ACS  Allergies  Allergen Reactions  . Levothyroxine Rash    Patient Measurements: Height: _0  (162.6 cm) Weight: 197 lb 11.2 oz (89.7 kg) IBW/kg (Calculated) : 54.7 Heparin Dosing Weight: 75.3  Vital Signs: Temp: 98 F (36.7 C) (10/27 0514) Temp Source: Oral (10/26 2024) BP: 110/73 (10/27 0514) Pulse Rate: 89 (10/27 0514)  Labs: Recent Labs    06/07/18 0752  06/07/18 1228 06/07/18 1632 06/07/18 1925 06/07/18 2201 06/08/18 0506  HGB 9.0*  --   --   --   --   --  9.1*  HCT 30.4*  --   --   --   --   --  30.6*  PLT 286  --   --   --   --   --  291  APTT  --   --  30  --   --   --   --   LABPROT  --   --  14.0  --   --   --   --   INR  --   --  1.09  --   --   --   --   HEPARINUNFRC  --   --   --   --  0.24*  --  0.22*  CREATININE 1.26*  --   --   --   --   --  1.11*  TROPONINI 0.16*   < >  --  0.14*  --  0.12* 0.11*   < > = values in this interval not displayed.    Estimated Creatinine Clearance: 62.8 mL/min (A) (by C-G formula based on SCr of 1.11 mg/dL (H)).   Medical History: Past Medical History:  Diagnosis Date  . Abdominal pain    a. 05/2018 HIDA scan wnl.  . ADHD   . Arthritis   . Asthma   . Bipolar 1 disorder ([place])   . Chest pain    a. Hx of cath in [place] - reportedly nl; b. 04/2018 MV: EF 22%, fixed dist ant septal, apical, and inferoapical defects - ? scar vs. attenuation. No ischemia.  . Cirrhosis of liver ([place])   . Depression   . Diabetes mellitus without complication ([place])   . Diverticulitis   . HFrEF (heart failure with reduced ejection fraction) ([place])    a. 06/2017 Echo: EF 20-25%, diff HK. Mildly dil LA.  Marland Kitchen Hypertension   . IBS (irritable bowel syndrome)   . Insomnia   . Migraines   . NICM (nonischemic cardiomyopathy) ([place])    a. EF prev 25%; b. 11/2014 s/p SJM Fortify Assura, single lead AICD (ser# [id number]); c. 06/2017 Echo: EF  20-25%.  . OSA (obstructive sleep apnea)   . PTSD (post-traumatic stress disorder)   . Restless leg syndrome   . Sleep apnea   . Vertigo     Medications:  Medications Prior to Admission  Medication Sig Dispense Refill Last Dose  . blood glucose meter kit and supplies KIT Dispense based on patient and insurance preference. Use up to four times daily as directed. (FOR ICD-9 250.00, 250.01). 1 each 0 Taking  . carvedilol (COREG) 12.5 MG tablet Take 1 tablet (12.5 mg total) by mouth 2 (two) times daily. 180 tablet 3 06/06/2018 at Unknown time  . dicyclomine (BENTYL) 20 MG tablet Take 1 tablet (20 mg total) by mouth 3 (three) times daily as needed for spasms. 90 tablet 2 PRN at  PRN  . famotidine (PEPCID) 40 MG tablet Take 1 tablet (40 mg total) by mouth every evening. 90 tablet 1 06/06/2018 at Unknown time  . furosemide (LASIX) 20 MG tablet Take 1 tablet (20 mg) by mouth once a day as needed for weight gain of 2 lbs. (Patient taking differently: Take 20 mg by mouth daily. Take 1 tablet (20 mg) by mouth once a day as needed for weight gain of 2 lbs.) 90 tablet 3 06/06/2018 at Unknown time  . Insulin Glargine (LANTUS SOLOSTAR) 100 UNIT/ML Solostar Pen Inject 20 Units into the skin daily at 10 pm. 5 pen 12 06/06/2018 at Unknown time  . Insulin Pen Needle 32G X 6 MM MISC 1 each by Does not apply route daily. 100 each 12 Taking  . lamoTRIgine (LAMICTAL) 25 MG tablet Take 1 tablet (25 mg total) by mouth 2 (two) times daily. 180 tablet 1 06/06/2018 at Unknown time  . metoCLOPramide (REGLAN) 10 MG tablet Take 1 tablet (10 mg total) by mouth 3 (three) times daily with meals. 90 tablet 1 06/06/2018 at Unknown time  . nitroGLYCERIN (NITROSTAT) 0.4 MG SL tablet Place 1 tablet (0.4 mg total) under the tongue every 5 (five) minutes as needed for chest pain. 30 tablet 0 06/07/2018 at 0500  . ondansetron (ZOFRAN ODT) 4 MG disintegrating tablet Take 1 tablet (4 mg total) by mouth every 8 (eight) hours as needed for  nausea or vomiting. 20 tablet 0 PRN at PRN  . pantoprazole (PROTONIX) 40 MG tablet Take 1 tablet (40 mg total) by mouth daily. 30 tablet 11 06/06/2018 at Unknown time  . PARoxetine (PAXIL) 30 MG tablet TAKE 1 TABLET BY MOUTH EVERY DAY 90 tablet 1 06/06/2018 at Unknown time  . QUEtiapine (SEROQUEL) 300 MG tablet TAKE 1 TABLET BY MOUTH EVERYDAY AT BEDTIME (Patient taking differently: Take 300 mg by mouth at bedtime. ) 90 tablet 1 06/06/2018 at Unknown time  . sucralfate (CARAFATE) 1 g tablet Take 1 tablet (1 g total) by mouth 4 (four) times daily. 60 tablet 3 PRN at PRN  . traMADol (ULTRAM) 50 MG tablet Take 1 tablet (50 mg total) by mouth every 8 (eight) hours as needed for moderate pain. 30 tablet 0 PRN at PRN  . cyclobenzaprine (FLEXERIL) 5 MG tablet Take 1 tablet (5 mg total) by mouth 3 (three) times daily as needed for muscle spasms. (Patient not taking: Reported on 06/07/2018) 60 tablet 1 Not Taking at Unknown time    Assessment: 52 y.o. female with a past medical history of asthma, bipolar, chest pain status post AICD, hypertension, presents to the emergency department with complaints of chest pain and abdominal pain. Prior admission approximately 1.5 weeks ago had a fairly significant work-up performed at that time including a HIDA scan to help rule out gallbladder pathology. No listed history of PTA anticoagulation.  Goal of Therapy:  Heparin level 0.3-0.7 units/ml Monitor platelets by anticoagulation protocol: Yes   Plan:  10/27 @ 0700 HL 0.22 subtherapeutic. Will rebolus w/ heparin 1100 units IV x 1 and increase rate to 1200 units/hr and will recheck HL @ 1300.  Tobie Lords, PharmD, BCPS Clinical Pharmacist 06/08/2018

## 2018-06-09 ENCOUNTER — Encounter: Payer: Self-pay | Admitting: Cardiovascular Disease

## 2018-06-09 ENCOUNTER — Encounter
Admission: EM | Disposition: A | Discharge status: Home / Self Care | Payer: Self-pay | Source: Home / Self Care | Attending: Internal Medicine

## 2018-06-09 DIAGNOSIS — I214 Non-ST elevation (NSTEMI) myocardial infarction: Secondary | ICD-10-CM

## 2018-06-09 HISTORY — PX: RIGHT/LEFT HEART CATH AND CORONARY ANGIOGRAPHY: CATH118266

## 2018-06-09 LAB — HEPARIN LEVEL (UNFRACTIONATED): Heparin Unfractionated: 0.49 IU/mL (ref 0.30–0.70)

## 2018-06-09 LAB — BASIC METABOLIC PANEL
Anion gap: 9 (ref 5–15)
BUN: 25 mg/dL — ABNORMAL HIGH (ref 6–20)
CALCIUM: 8.5 mg/dL — AB (ref 8.9–10.3)
CO2: 22 mmol/L (ref 22–32)
CREATININE: 1.2 mg/dL — AB (ref 0.44–1.00)
Chloride: 106 mmol/L (ref 98–111)
GFR calc Af Amer: 58 mL/min — ABNORMAL LOW (ref >60)
GFR calc non Af Amer: 50 mL/min — ABNORMAL LOW (ref >60)
GLUCOSE: 217 mg/dL — AB (ref 70–99)
Potassium: 3.9 mmol/L (ref 3.5–5.1)
Sodium: 137 mmol/L (ref 135–145)

## 2018-06-09 LAB — CBC
HEMATOCRIT: 29.7 % — AB (ref 36.0–46.0)
Hemoglobin: 8.8 g/dL — ABNORMAL LOW (ref 12.0–15.0)
MCH: 29.3 pg (ref 26.0–34.0)
MCHC: 29.6 g/dL — AB (ref 30.0–36.0)
MCV: 99 fL (ref 80.0–100.0)
Platelets: 290 10*3/uL (ref 150–400)
RBC: 3 MIL/uL — ABNORMAL LOW (ref 3.87–5.11)
RDW: 17.1 % — AB (ref 11.5–15.5)
WBC: 6.8 10*3/uL (ref 4.0–10.5)
nRBC: 0 % (ref 0.0–0.2)

## 2018-06-09 LAB — PROTIME-INR
INR: 1.12
Prothrombin Time: 14.3 seconds (ref 11.4–15.2)

## 2018-06-09 LAB — GLUCOSE, CAPILLARY
GLUCOSE-CAPILLARY: 199 mg/dL — AB (ref 70–99)
Glucose-Capillary: 186 mg/dL — ABNORMAL HIGH (ref 70–99)
Glucose-Capillary: 271 mg/dL — ABNORMAL HIGH (ref 70–99)

## 2018-06-09 SURGERY — RIGHT/LEFT HEART CATH AND CORONARY ANGIOGRAPHY
Anesthesia: Moderate Sedation

## 2018-06-09 MED ORDER — FENTANYL CITRATE (PF) 100 MCG/2ML IJ SOLN
INTRAMUSCULAR | Status: DC | PRN
  Administered 2018-06-09 (×2): 25 ug via INTRAVENOUS

## 2018-06-09 MED ORDER — MIDAZOLAM HCL 2 MG/2ML IJ SOLN
INTRAMUSCULAR | Status: DC | PRN
  Administered 2018-06-09: 1 mg via INTRAVENOUS

## 2018-06-09 MED ORDER — HEPARIN SODIUM (PORCINE) 1000 UNIT/ML IJ SOLN
INTRAMUSCULAR | Status: AC
  Filled 2018-06-09: qty 1

## 2018-06-09 MED ORDER — FUROSEMIDE 10 MG/ML IJ SOLN
40.0000 mg | Freq: Two times a day (BID) | INTRAMUSCULAR | Status: DC
  Administered 2018-06-09 – 2018-06-10 (×2): 40 mg via INTRAVENOUS
  Filled 2018-06-09 (×2): qty 4

## 2018-06-09 MED ORDER — FENTANYL CITRATE (PF) 100 MCG/2ML IJ SOLN
INTRAMUSCULAR | Status: AC
  Filled 2018-06-09: qty 2

## 2018-06-09 MED ORDER — ENOXAPARIN SODIUM 40 MG/0.4ML ~~LOC~~ SOLN
40.0000 mg | SUBCUTANEOUS | Status: DC
  Administered 2018-06-10 – 2018-06-13 (×4): 40 mg via SUBCUTANEOUS
  Filled 2018-06-09 (×4): qty 0.4

## 2018-06-09 MED ORDER — MIDAZOLAM HCL 2 MG/2ML IJ SOLN
INTRAMUSCULAR | Status: AC
  Filled 2018-06-09: qty 2

## 2018-06-09 MED ORDER — INSULIN GLARGINE 100 UNIT/ML ~~LOC~~ SOLN
14.0000 [IU] | Freq: Every day | SUBCUTANEOUS | Status: DC
  Administered 2018-06-09 – 2018-06-10 (×2): 14 [IU] via SUBCUTANEOUS
  Filled 2018-06-09 (×3): qty 0.14

## 2018-06-09 MED ORDER — HEPARIN (PORCINE) IN NACL 1000-0.9 UT/500ML-% IV SOLN
INTRAVENOUS | Status: AC
  Filled 2018-06-09: qty 1000

## 2018-06-09 MED ORDER — SODIUM CHLORIDE 0.9% FLUSH
3.0000 mL | Freq: Two times a day (BID) | INTRAVENOUS | Status: DC
  Administered 2018-06-09 – 2018-06-13 (×8): 3 mL via INTRAVENOUS

## 2018-06-09 MED ORDER — CARVEDILOL 6.25 MG PO TABS
6.2500 mg | ORAL_TABLET | Freq: Two times a day (BID) | ORAL | Status: DC
  Administered 2018-06-09 – 2018-06-13 (×7): 6.25 mg via ORAL
  Filled 2018-06-09 (×8): qty 1

## 2018-06-09 MED ORDER — SODIUM CHLORIDE 0.9% FLUSH: 3.0000 mL | INTRAVENOUS | Status: DC | PRN

## 2018-06-09 MED ORDER — VERAPAMIL HCL 2.5 MG/ML IV SOLN
INTRAVENOUS | Status: AC
  Filled 2018-06-09: qty 2

## 2018-06-09 MED ORDER — SACUBITRIL-VALSARTAN 24-26 MG PO TABS
1.0000 | ORAL_TABLET | Freq: Two times a day (BID) | ORAL | Status: DC
  Administered 2018-06-09 – 2018-06-12 (×6): 1 via ORAL
  Filled 2018-06-09 (×7): qty 1

## 2018-06-09 MED ORDER — SODIUM CHLORIDE 0.9 % IV SOLN: 250.0000 mL | INTRAVENOUS | Status: DC | PRN

## 2018-06-09 MED ORDER — HEPARIN SODIUM (PORCINE) 1000 UNIT/ML IJ SOLN
INTRAMUSCULAR | Status: DC | PRN
  Administered 2018-06-09: 4500 [IU] via INTRAVENOUS

## 2018-06-09 SURGICAL SUPPLY — 10 items
CATH BALLN WEDGE 5F 110CM (CATHETERS) ×3 IMPLANT
CATH INFINITI 5FR JK (CATHETERS) ×3 IMPLANT
DEVICE RAD TR BAND REGULAR (VASCULAR PRODUCTS) ×3 IMPLANT
GLIDESHEATH SLEND SS 6F .021 (SHEATH) ×3 IMPLANT
GUIDEWIRE .025 260CM (WIRE) ×3 IMPLANT
KIT MANI 3VAL PERCEP (MISCELLANEOUS) ×3 IMPLANT
KIT RIGHT HEART (MISCELLANEOUS) ×3 IMPLANT
PACK CARDIAC CATH (CUSTOM PROCEDURE TRAY) ×3 IMPLANT
SHEATH GLIDE SLENDER 4/5FR (SHEATH) ×3 IMPLANT
WIRE ROSEN-J .035X260CM (WIRE) ×3 IMPLANT

## 2018-06-09 NOTE — Interval H&P Note (Signed)
Cath Lab Visit (complete for each Cath Lab visit)  Clinical Evaluation Leading to the Procedure:   ACS: Yes.    Non-ACS:     History and Physical Interval Note:  06/09/2018 10:16 AM  Bonnie Ochoa  has presented today for surgery, with the diagnosis of NSTEMI and acute on chronic systolic heart failure  The various methods of treatment have been discussed with the patient and family. After consideration of risks, benefits and other options for treatment, the patient has consented to  Procedure(s): RIGHT/LEFT HEART CATH AND CORONARY ANGIOGRAPHY (N/A) as a surgical intervention .  The patient's history has been reviewed, patient examined, no change in status, stable for surgery.  I have reviewed the patient's chart and labs.  Questions were answered to the patient's satisfaction.     Lorine Bears

## 2018-06-09 NOTE — Progress Notes (Signed)
[facility] - [place] at [facility]   PATIENT NAME: Bonnie Ochoa    MR#:  [id number]  DATE OF BIRTH:  [birth year]  SUBJECTIVE: Patient admitted for chest pain, shortness of breath.  CHIEF COMPLAINT:   Chief Complaint  Patient presents with  . Chest Pain  No chest pain or shortness of breath this morning.  Troponin peaked up to 0.12.   post cardiac cath this morning.  Found to have elevated wedge pressure with normal coronary arteries.Marland Kitchen  REVIEW OF SYSTEMS:   ROS CONSTITUTIONAL: No fever, fatigue or weakness.  EYES: No blurred or double vision.  EARS, NOSE, AND THROAT: No tinnitus or ear pain.  RESPIRATORY: Chest pain, shortness of breath  cARDIOVASCULAR: No chest pain, orthopnea, edema.  GASTROINTESTINAL: No nausea, vomiting, diarrhea or abdominal pain.  GENITOURINARY: No dysuria, hematuria.  ENDOCRINE: No polyuria, nocturia,  HEMATOLOGY: No anemia, easy bruising or bleeding SKIN: No rash or lesion. MUSCULOSKELETAL: No joint pain or arthritis.   NEUROLOGIC: No tingling, numbness, weakness.  PSYCHIATRY: No anxiety or depression.   DRUG ALLERGIES:   Allergies  Allergen Reactions  . Levothyroxine Rash    VITALS:  Blood pressure 119/88, pulse 85, temperature 97.8 F (36.6 C), temperature source Oral, resp. rate (!) 30, height 5\' 4"  (1.626 m), weight 89.4 kg, SpO2 99 %.  PHYSICAL EXAMINATION:  GENERAL:  52 y.o.-year-old patient lying in the bed with no acute distress.  EYES: Pupils equal, round, reactive to light and accommodation. No scleral icterus. Extraocular muscles intact.  HEENT: Head atraumatic, normocephalic. Oropharynx and nasopharynx clear.  NECK:  Supple, no jugular venous distention. No thyroid enlargement, no tenderness.  LUNGS: Normal breath sounds bilaterally, no wheezing, rales,rhonchi or crepitation. No use of accessory muscles of respiration.  CARDIOVASCULAR: S1, S2 normal. No murmurs, rubs, or gallops.  ABDOMEN: Soft, nontender,  nondistended. Bowel sounds present. No organomegaly or mass.  EXTREMITIES: No pedal edema, cyanosis, or clubbing.  NEUROLOGIC: Cranial nerves II through XII are intact. Muscle strength 5/5 in all extremities. Sensation intact. Gait not checked.  PSYCHIATRIC: The patient is alert and oriented x 3.  SKIN: No obvious rash, lesion, or ulcer.    LABORATORY PANEL:   CBC Recent Labs  Lab 06/09/18 0153  WBC 6.8  HGB 8.8*  HCT 29.7*  PLT 290   ------------------------------------------------------------------------------------------------------------------  Chemistries  Recent Labs  Lab 06/07/18 0752  06/09/18 0153  NA 137   < > 137  K 3.6   < > 3.9  CL 105   < > 106  CO2 22   < > 22  GLUCOSE 239*   < > 217*  BUN 24*   < > 25*  CREATININE 1.26*   < > 1.20*  CALCIUM 8.8*   < > 8.5*  AST 38  --   --   ALT 125*  --   --   ALKPHOS 103  --   --   BILITOT 1.3*  --   --    < > = values in this interval not displayed.   ------------------------------------------------------------------------------------------------------------------  Cardiac Enzymes Recent Labs  Lab 06/08/18 0506  TROPONINI 0.11*   ------------------------------------------------------------------------------------------------------------------  RADIOLOGY:  No results found.  EKG:   Orders placed or performed during the hospital encounter of 06/07/18  . EKG 12-Lead  . EKG 12-Lead  . ED EKG within 10 minutes  . ED EKG within 10 minutes    ASSESSMENT AND PLAN:   52 year old female patient seen with chest pain, shortness of breath,  found to have mildly elevated troponins. 1.  Non-ST elevation MI likely due to supply, demand ischemia due to acute decompensated heart failure.    2.  Acute on chronic systolic heart failure, continue IV Lasix, Coreg, hydralazine, Imdur.  Patient pulmonary capillary wedge pressure is still high, cardiologist recommend continuing IV diuretics for at least 24 to 48 hours.   Continue beta-blockers, Entresto.   Continue oxygen patient is on 4 L of oxygen and sats  99% so wean off oxygen as tolerated. 3/ essential hypertension: Stable. #4 diabetes mellitus type 2: Decrease Lantus dose by 50% as patient is n.p.o. after midnight for cardiac cath tomorrow.  Bipolar disorder: Patient is on Lamictal.  Paxil, Seroquel.  All the records are reviewed and case discussed with Care Management/Social Workerr. Management plans discussed with the patient, family and they are in agreement.  CODE STATUS: full  TOTAL TIME TAKING CARE OF THIS PATIENT: 40 minutes.   POSSIBLE D/C IN 1-2 DAYS, DEPENDING ON CLINICAL CONDITION.   Katha Hamming M.D on 06/09/2018 at 1:13 PM  Between 7am to 6pm - Pager - [phone]  After 6pm go to [email or web address] - password [email or web address] [facility]  [place] [facility]  Office  [phone]  CC: Primary care physician; Dorcas Carrow, DO   Note: This dictation was prepared with Dragon dictation along with smaller phrase technology. Any transcriptional errors that result from this process are unintentional.

## 2018-06-09 NOTE — Consult Note (Signed)
[place] for heparin infusion Indication: chest pain/ACS  Allergies  Allergen Reactions  . Levothyroxine Rash    Patient Measurements: Height: 5' 4" (162.6 cm) Weight: 197 lb 11.2 oz (89.7 kg) IBW/kg (Calculated) : 54.7 Heparin Dosing Weight: 75.3  Vital Signs: Temp: 98 F (36.7 C) (10/27 2010) Temp Source: Oral (10/27 2010) BP: 129/98 (10/27 2010) Pulse Rate: 94 (10/27 2010)  Labs: Recent Labs    06/07/18 0752  06/07/18 1228 06/07/18 1632  06/07/18 2201 06/08/18 0506 06/08/18 1308 06/08/18 1947 06/09/18 0153  HGB 9.0*  --   --   --   --   --  9.1*  --   --  8.8*  HCT 30.4*  --   --   --   --   --  30.6*  --   --  29.7*  PLT 286  --   --   --   --   --  291  --   --  290  APTT  --   --  30  --   --   --   --   --   --   --   LABPROT  --   --  14.0  --   --   --   --   --   --  14.3  INR  --   --  1.09  --   --   --   --   --   --  1.12  HEPARINUNFRC  --   --   --   --    < >  --  0.22* 0.24* 0.52 0.49  CREATININE 1.26*  --   --   --   --   --  1.11*  --   --  1.20*  TROPONINI 0.16*   < >  --  0.14*  --  0.12* 0.11*  --   --   --    < > = values in this interval not displayed.    Estimated Creatinine Clearance: 58.1 mL/min (A) (by C-G formula based on SCr of 1.2 mg/dL (H)).   Medical History: Past Medical History:  Diagnosis Date  . Abdominal pain    a. 05/2018 HIDA scan wnl.  . ADHD   . Arthritis   . Asthma   . Bipolar 1 disorder ([place])   . Chest pain    a. Hx of cath in [place] - reportedly nl; b. 04/2018 MV: EF 22%, fixed dist ant septal, apical, and inferoapical defects - ? scar vs. attenuation. No ischemia.  . Cirrhosis of liver ([place])   . Depression   . Diabetes mellitus without complication ([place])   . Diverticulitis   . HFrEF (heart failure with reduced ejection fraction) ([place])    a. 06/2017 Echo: EF 20-25%, diff HK. Mildly dil LA.  Marland Kitchen Hypertension   . IBS (irritable bowel syndrome)   . Insomnia   . Migraines   .  NICM (nonischemic cardiomyopathy) ([place])    a. EF prev 25%; b. 11/2014 s/p SJM Fortify [place], single lead AICD (ser# [id number]); c. 06/2017 Echo: EF 20-25%.  . OSA (obstructive sleep apnea)   . PTSD (post-traumatic stress disorder)   . Restless leg syndrome   . Sleep apnea   . Vertigo     Medications:  Medications Prior to Admission  Medication Sig Dispense Refill Last Dose  . blood glucose meter kit and supplies KIT Dispense based on patient and insurance preference. Use  up to four times daily as directed. (FOR ICD-9 250.00, 250.01). 1 each 0 Taking  . carvedilol (COREG) 12.5 MG tablet Take 1 tablet (12.5 mg total) by mouth 2 (two) times daily. 180 tablet 3 06/06/2018 at Unknown time  . dicyclomine (BENTYL) 20 MG tablet Take 1 tablet (20 mg total) by mouth 3 (three) times daily as needed for spasms. 90 tablet 2 PRN at PRN  . famotidine (PEPCID) 40 MG tablet Take 1 tablet (40 mg total) by mouth every evening. 90 tablet 1 06/06/2018 at Unknown time  . furosemide (LASIX) 20 MG tablet Take 1 tablet (20 mg) by mouth once a day as needed for weight gain of 2 lbs. (Patient taking differently: Take 20 mg by mouth daily. Take 1 tablet (20 mg) by mouth once a day as needed for weight gain of 2 lbs.) 90 tablet 3 06/06/2018 at Unknown time  . Insulin Glargine (LANTUS SOLOSTAR) 100 UNIT/ML Solostar Pen Inject 20 Units into the skin daily at 10 pm. 5 pen 12 06/06/2018 at Unknown time  . Insulin Pen Needle 32G X 6 MM MISC 1 each by Does not apply route daily. 100 each 12 Taking  . lamoTRIgine (LAMICTAL) 25 MG tablet Take 1 tablet (25 mg total) by mouth 2 (two) times daily. 180 tablet 1 06/06/2018 at Unknown time  . metoCLOPramide (REGLAN) 10 MG tablet Take 1 tablet (10 mg total) by mouth 3 (three) times daily with meals. 90 tablet 1 06/06/2018 at Unknown time  . nitroGLYCERIN (NITROSTAT) 0.4 MG SL tablet Place 1 tablet (0.4 mg total) under the tongue every 5 (five) minutes as needed for chest pain. 30 tablet 0  06/07/2018 at 0500  . ondansetron (ZOFRAN ODT) 4 MG disintegrating tablet Take 1 tablet (4 mg total) by mouth every 8 (eight) hours as needed for nausea or vomiting. 20 tablet 0 PRN at PRN  . pantoprazole (PROTONIX) 40 MG tablet Take 1 tablet (40 mg total) by mouth daily. 30 tablet 11 06/06/2018 at Unknown time  . PARoxetine (PAXIL) 30 MG tablet TAKE 1 TABLET BY MOUTH EVERY DAY 90 tablet 1 06/06/2018 at Unknown time  . QUEtiapine (SEROQUEL) 300 MG tablet TAKE 1 TABLET BY MOUTH EVERYDAY AT BEDTIME (Patient taking differently: Take 300 mg by mouth at bedtime. ) 90 tablet 1 06/06/2018 at Unknown time  . sucralfate (CARAFATE) 1 g tablet Take 1 tablet (1 g total) by mouth 4 (four) times daily. 60 tablet 3 PRN at PRN  . traMADol (ULTRAM) 50 MG tablet Take 1 tablet (50 mg total) by mouth every 8 (eight) hours as needed for moderate pain. 30 tablet 0 PRN at PRN  . cyclobenzaprine (FLEXERIL) 5 MG tablet Take 1 tablet (5 mg total) by mouth 3 (three) times daily as needed for muscle spasms. (Patient not taking: Reported on 06/07/2018) 60 tablet 1 Not Taking at Unknown time    Assessment: 52 y.o. female with a past medical history of asthma, bipolar, chest pain status post AICD, hypertension, presents to the emergency department with complaints of chest pain and abdominal pain. Prior admission approximately 1.5 weeks ago had a fairly significant work-up performed at that time including a HIDA scan to help rule out gallbladder pathology. No listed history of PTA anticoagulation.  Goal of Therapy:  Heparin level 0.3-0.7 units/ml Monitor platelets by anticoagulation protocol: Yes   Plan:  10/28 @ 0200 HL 0.49 therapeutic. Will continue current rate and will recheck HL w/ am labs. Hgb down to 8.8 will continue to   monitor.   , PharmD, BCPS Clinical Pharmacist 06/09/2018  

## 2018-06-09 NOTE — Progress Notes (Signed)
Inpatient Diabetes Program Recommendations  AACE/ADA: New Consensus Statement on Inpatient Glycemic Control (2015)  Target Ranges:  Prepandial:   less than 140 mg/dL      Peak postprandial:   less than 180 mg/dL (1-2 hours)      Critically ill patients:  140 - 180 mg/dL   Results for SAPPHIRA, VARIAN (MRN [id number]) as of 06/09/2018 09:53  Ref. Range 06/08/2018 08:30 06/08/2018 11:38 06/08/2018 16:52 06/08/2018 21:10  Glucose-Capillary Latest Ref Range: 70 - 99 mg/dL 035 (H)  2 units NOVOLOG  275 (H)  5 units NOVOLOG  162 (H)  2 units NOVOLOG  225 (H)    10 units LANTUS   Results for LAKASHA, BEATIE (MRN [id number]) as of 06/09/2018 09:53  Ref. Range 06/09/2018 08:10  Glucose-Capillary Latest Ref Range: 70 - 99 mg/dL 937 (H)    Admit with: CP/ SOB/ Unstable Angina  History: DM  Home DM Meds: Lantus 20 units QHS  Current Orders: Lantus 10 units QHS      Novolog Sensitive Correction Scale/ SSI (0-9 units) TID AC      Note patient received 10 units Lantus last PM (50% reduction) since NPO this AM for Cardiac Cath.  Having some glucose elevations.    MD- Please consider the following in-hospital insulin adjustments after patient resumes PO diet after Procedure today:  1. Increase Lantus to 15 units QHS (75% total home dose)  2. Start Novolog Meal Coverage: Novolog 4 units TID with meals  (Please add the following Hold Parameters: Hold if pt eats <50% of meal, Hold if pt NPO)     --Will follow patient during hospitalization--  Ambrose Finland RN, MSN, CDE Diabetes Coordinator Inpatient Glycemic Control Team Team Pager: [phone] (8a-5p)

## 2018-06-09 NOTE — Progress Notes (Signed)
Progress Note  Patient Name: Bonnie Ochoa Date of Encounter: 06/09/2018  Primary Cardiologist: Julien Nordmann, MD   Subjective   The patient reports improvement in shortness of breath but she is still not back to baseline.  She underwent a right and left cardiac catheterization today which showed normal coronary arteries.  Right heart catheterization showed severely elevated wedge pressure and severely reduced cardiac output.  Inpatient Medications    Scheduled Meds: . [MAR Hold] carvedilol  12.5 mg Oral BID  . [MAR Hold] famotidine  40 mg Oral QPM  . [MAR Hold] hydrALAZINE  10 mg Oral TID  . [MAR Hold] Influenza vac split quadrivalent PF  0.5 mL Intramuscular Tomorrow-1000  . [MAR Hold] insulin aspart  0-9 Units Subcutaneous TID WC  . [MAR Hold] insulin glargine  10 Units Subcutaneous Q2200  . [MAR Hold] isosorbide dinitrate  10 mg Oral TID  . [MAR Hold] lamoTRIgine  25 mg Oral BID  . [MAR Hold] metoCLOPramide  10 mg Oral TID WC  . [MAR Hold] pantoprazole  40 mg Oral Daily  . [MAR Hold] PARoxetine  30 mg Oral Daily  . [MAR Hold] QUEtiapine  300 mg Oral QHS  . [MAR Hold] sodium chloride flush  3 mL Intravenous Q12H  . [MAR Hold] sodium chloride flush  3 mL Intravenous Q12H  . sodium chloride flush  3 mL Intravenous Q12H   Continuous Infusions: . sodium chloride    . sodium chloride 10 mL/hr at 06/09/18 0612  . heparin 1,350 Units/hr (06/09/18 0314)   PRN Meds: sodium chloride, [MAR Hold] acetaminophen **OR** [MAR Hold] acetaminophen, [MAR Hold] cyclobenzaprine, [MAR Hold] dicyclomine, [MAR Hold] nitroGLYCERIN, [MAR Hold] ondansetron **OR** [MAR Hold] ondansetron (ZOFRAN) IV, [MAR Hold] ondansetron, [MAR Hold] sodium chloride flush, sodium chloride flush, [MAR Hold] traMADol   Vital Signs    Vitals:   06/09/18 0953 06/09/18 1108 06/09/18 1115 06/09/18 1130  BP: (!) 132/100 (!) 120/98 (!) 122/91 118/89  Pulse: 94  84 83  Resp: (!) 27 (!) 22 (!) 22 (!) 25  Temp:        TempSrc:      SpO2: 92% 99% 100% 100%  Weight: 89.4 kg     Height:        Intake/Output Summary (Last 24 hours) at 06/09/2018 1147 Last data filed at 06/08/2018 2207 Gross per 24 hour  Intake 830.55 ml  Output 1500 ml  Net -669.45 ml   Filed Weights   06/07/18 0746 06/07/18 1618 06/09/18 0953  Weight: 91.4 kg 89.7 kg 89.4 kg    Telemetry    Normal sinus rhythm with PVCs.- Personally Reviewed  ECG     - Personally Reviewed  Physical Exam   GEN: No acute distress.   Neck:  Jugular venous pressure is not well visualized. Cardiac: RRR, no murmurs, rubs, or gallops.  Respiratory: Clear to auscultation bilaterally.  Diminished breath sounds at the base. GI: Soft, nontender, non-distended  MS: No edema; No deformity. Neuro:  Nonfocal  Psych: Normal affect   Labs    Chemistry Recent Labs  Lab 06/03/18 1347 06/07/18 0752 06/08/18 0506 06/09/18 0153  NA 142 137 140 137  K 4.2 3.6 3.5 3.9  CL 105 105 108 106  CO2 22 22 26 22   GLUCOSE 190* 239* 222* 217*  BUN 9 24* 19 25*  CREATININE 0.90 1.26* 1.11* 1.20*  CALCIUM 9.2 8.8* 8.6* 8.5*  PROT 7.9 8.1  --   --   ALBUMIN 4.3 3.8  --   --  AST 27 38  --   --   ALT 266* 125*  --   --   ALKPHOS 133* 103  --   --   BILITOT 1.0 1.3*  --   --   GFRNONAA 73 47* 55* 50*  GFRAA 84 55* >60 58*  ANIONGAP  --  10 6 9      Hematology Recent Labs  Lab 06/07/18 0752 06/08/18 0506 06/09/18 0153  WBC 7.7 7.0 6.8  RBC 3.05* 3.07* 3.00*  HGB 9.0* 9.1* 8.8*  HCT 30.4* 30.6* 29.7*  MCV 99.7 99.7 99.0  MCH 29.5 29.6 29.3  MCHC 29.6* 29.7* 29.6*  RDW 16.9* 17.2* 17.1*  PLT 286 291 290    Cardiac Enzymes Recent Labs  Lab 06/07/18 1126 06/07/18 1632 06/07/18 2201 06/08/18 0506  TROPONINI 0.22* 0.14* 0.12* 0.11*   No results for input(s): TROPIPOC in the last 168 hours.   BNPNo results for input(s): BNP, PROBNP in the last 168 hours.   DDimer No results for input(s): DDIMER in the last 168 hours.   Radiology     No results found.  Cardiac Studies   Echo (07/03/17): - Procedure narrative: Transthoracic echocardiography. The study was technically difficult. - Left ventricle: The cavity size was normal. There was moderate concentric hypertrophy. Systolic function was severely reduced. The estimated ejection fraction was in the range of 20% to 25%. Diffuse hypokinesis. Regional wall motion abnormalities cannot be excluded. - Left atrium: The atrium was mildly dilated. - Right ventricle: Systolic function was normal. - Pulmonary arteries: Systolic pressure could not be accurately estimated.  Patient Profile     52 y.o. female with a hx of chronic systolic heart failure due to non-ischemic cardiomyopathy, moderate mitral regurgitation, pulmonary hypertension, bipolar disorder, hepatic cirrhosis, hypertension, diabetes mellitus, obstructive sleep apnea, chronic anemia, and polysubstance abuse,admitted with chest pain and mild troponin elevation in the setting of acute on chronic systolic heart failure.  Assessment & Plan    1.  Supply demand ischemia: Elevated troponin is due to supply demand ischemia in the setting of acute decompensated systolic heart failure.  Cardiac catheterization was done today which showed normal coronary arteries.  2.  Acute on chronic systolic heart failure: Right heart catheterization today showed severely elevated filling pressures and severely reduced cardiac output.  The patient continues to be significantly volume overloaded. I resumed furosemide 40 mg intravenously twice daily. I elected to decrease the dose of carvedilol to 6.25 mg twice daily in order to initiate a small dose of Entresto. Monitor renal function closely.  If there is worsening renal function with diuresis, the patient might require short-term inotropic support.  3.  Essential hypertension: Blood pressure is controlled on current medications.  Medications were adjusted as outlined  above.  4.  Obstructive sleep apnea: Continue CPAP.     For questions or updates, please contact [facility] Please consult [email or web address] for contact info under        Signed, Lorine Bears, MD  06/09/2018, 11:47 AM

## 2018-06-10 DIAGNOSIS — I428 Other cardiomyopathies: Secondary | ICD-10-CM

## 2018-06-10 LAB — BASIC METABOLIC PANEL
Anion gap: 12 (ref 5–15)
BUN: 18 mg/dL (ref 6–20)
CHLORIDE: 102 mmol/L (ref 98–111)
CO2: 23 mmol/L (ref 22–32)
CREATININE: 1.11 mg/dL — AB (ref 0.44–1.00)
Calcium: 9.4 mg/dL (ref 8.9–10.3)
GFR calc non Af Amer: 55 mL/min — ABNORMAL LOW (ref >60)
Glucose, Bld: 157 mg/dL — ABNORMAL HIGH (ref 70–99)
POTASSIUM: 3.7 mmol/L (ref 3.5–5.1)
Sodium: 137 mmol/L (ref 135–145)

## 2018-06-10 LAB — GLUCOSE, CAPILLARY
GLUCOSE-CAPILLARY: 164 mg/dL — AB (ref 70–99)
GLUCOSE-CAPILLARY: 177 mg/dL — AB (ref 70–99)
GLUCOSE-CAPILLARY: 168 mg/dL — AB (ref 70–99)
Glucose-Capillary: 213 mg/dL — ABNORMAL HIGH (ref 70–99)

## 2018-06-10 LAB — CBC
HCT: 34.3 % — ABNORMAL LOW (ref 36.0–46.0)
HEMOGLOBIN: 10.3 g/dL — AB (ref 12.0–15.0)
MCH: 29.5 pg (ref 26.0–34.0)
MCHC: 30 g/dL (ref 30.0–36.0)
MCV: 98.3 fL (ref 80.0–100.0)
NRBC: 0 % (ref 0.0–0.2)
PLATELETS: 310 10*3/uL (ref 150–400)
RBC: 3.49 MIL/uL — AB (ref 3.87–5.11)
RDW: 16.7 % — ABNORMAL HIGH (ref 11.5–15.5)
WBC: 5.4 10*3/uL (ref 4.0–10.5)

## 2018-06-10 LAB — MAGNESIUM: MAGNESIUM: 2.1 mg/dL (ref 1.7–2.4)

## 2018-06-10 MED ORDER — FUROSEMIDE 40 MG PO TABS
40.0000 mg | ORAL_TABLET | Freq: Two times a day (BID) | ORAL | Status: DC
  Administered 2018-06-10 (×2): 40 mg via ORAL
  Filled 2018-06-10 (×2): qty 1

## 2018-06-10 MED ORDER — FUROSEMIDE 10 MG/ML IJ SOLN
40.0000 mg | Freq: Two times a day (BID) | INTRAMUSCULAR | Status: DC
  Administered 2018-06-11 (×2): 40 mg via INTRAVENOUS
  Filled 2018-06-10 (×2): qty 4

## 2018-06-10 MED ORDER — POTASSIUM CHLORIDE CRYS ER 10 MEQ PO TBCR
10.0000 meq | EXTENDED_RELEASE_TABLET | Freq: Two times a day (BID) | ORAL | Status: AC
  Administered 2018-06-10 – 2018-06-11 (×3): 10 meq via ORAL
  Filled 2018-06-10 (×3): qty 1

## 2018-06-10 NOTE — Progress Notes (Signed)
Progress Note  Patient Name: Bonnie Ochoa Date of Encounter: 06/10/2018  Primary Cardiologist: Julien Nordmann, MD   Subjective   1 day s/p R/L heart catheterization, which showed normal cors but severely elevated wedge pressure and severely reduced CO.   She reportedly experienced chest pain earlier today, described as sharp and lasting only seconds and identified as a "pinpoint location" at the left side of her chest. She rated this pain 8/10 and reported both diaphoresis and feeling flushed shortly thereafter. She reportedly has not had an additional episode since that time. She denies any current SOB, nausea (currently eating breakfast), feelings of pre-syncope, or HA. She also denies c/o current CP, palpitations, or rapid heart rate.  She also reports left epigastric pain and stated she had questions regarding her pancreas - she plans to ask these questions when IM sees her later this AM.   Ordered daily BMET as continued diuresis with addition of Entresto: K 3.9  3.7. Repleting, Mg check. Renal function improved overnight Cr 1.20  1.11 (baseline 0.90) Net I/O yesterday - . Weight not yet documented today  Inpatient Medications    Scheduled Meds: . carvedilol  6.25 mg Oral BID  . enoxaparin (LOVENOX) injection  40 mg Subcutaneous Q24H  . famotidine  40 mg Oral QPM  . furosemide  40 mg Intravenous Q12H  . hydrALAZINE  10 mg Oral TID  . Influenza vac split quadrivalent PF  0.5 mL Intramuscular Tomorrow-1000  . insulin aspart  0-9 Units Subcutaneous TID WC  . insulin glargine  14 Units Subcutaneous Q2200  . isosorbide dinitrate  10 mg Oral TID  . lamoTRIgine  25 mg Oral BID  . metoCLOPramide  10 mg Oral TID WC  . pantoprazole  40 mg Oral Daily  . PARoxetine  30 mg Oral Daily  . QUEtiapine  300 mg Oral QHS  . sacubitril-valsartan  1 tablet Oral BID  . sodium chloride flush  3 mL Intravenous Q12H  . sodium chloride flush  3 mL Intravenous Q12H  . sodium chloride flush   3 mL Intravenous Q12H   Continuous Infusions: . sodium chloride     PRN Meds: sodium chloride, acetaminophen **OR** acetaminophen, cyclobenzaprine, dicyclomine, nitroGLYCERIN, ondansetron **OR** ondansetron (ZOFRAN) IV, ondansetron, sodium chloride flush, sodium chloride flush, traMADol   Vital Signs    Vitals:   06/09/18 1507 06/09/18 2036 06/10/18 0518 06/10/18 0519  BP: (!) 123/94 131/85 112/70   Pulse: 87 95 87 89  Resp: 18 18 18    Temp: 98.1 F (36.7 C) 98.6 F (37 C) 97.8 F (36.6 C)   TempSrc: Oral Oral Oral   SpO2: 99% 100% 90% 93%  Weight:      Height:        Intake/Output Summary (Last 24 hours) at 06/10/2018 0756 Last data filed at 06/09/2018 2130 Gross per 24 hour  Intake -  Output 700 ml  Net -700 ml   Filed Weights   06/07/18 0746 06/07/18 1618 06/09/18 0953  Weight: 91.4 kg 89.7 kg 89.4 kg    Telemetry    Normal sinus rhythm with PVCs, HR 80-90s.- Personally Reviewed  ECG     No new tracings- Personally Reviewed  Physical Exam   GEN: No acute distress.  Joined by a family member. Eating breakfast. Neck:  Jugular venous pressure is not well visualized. Cardiac: RRR, no murmurs, rubs, or gallops.  Respiratory: Clear to auscultation bilaterally.  Bibasilar diminished breath sounds. GI: Soft, nontender, non-distended  MS: No edema; No deformity.  Neuro:  Nonfocal  Psych: Normal affect   Labs    Chemistry Recent Labs  Lab 06/03/18 1347 06/07/18 0752 06/08/18 0506 06/09/18 0153  NA 142 137 140 137  K 4.2 3.6 3.5 3.9  CL 105 105 108 106  CO2 22 22 26 22   GLUCOSE 190* 239* 222* 217*  BUN 9 24* 19 25*  CREATININE 0.90 1.26* 1.11* 1.20*  CALCIUM 9.2 8.8* 8.6* 8.5*  PROT 7.9 8.1  --   --   ALBUMIN 4.3 3.8  --   --   AST 27 38  --   --   ALT 266* 125*  --   --   ALKPHOS 133* 103  --   --   BILITOT 1.0 1.3*  --   --   GFRNONAA 73 47* 55* 50*  GFRAA 84 55* >60 58*  ANIONGAP  --  10 6 9      Hematology Recent Labs  Lab 06/08/18 0506  06/09/18 0153 06/10/18 0603  WBC 7.0 6.8 5.4  RBC 3.07* 3.00* 3.49*  HGB 9.1* 8.8* 10.3*  HCT 30.6* 29.7* 34.3*  MCV 99.7 99.0 98.3  MCH 29.6 29.3 29.5  MCHC 29.7* 29.6* 30.0  RDW 17.2* 17.1* 16.7*  PLT 291 290 310    Cardiac Enzymes Recent Labs  Lab 06/07/18 1126 06/07/18 1632 06/07/18 2201 06/08/18 0506  TROPONINI 0.22* 0.14* 0.12* 0.11*   No results for input(s): TROPIPOC in the last 168 hours.   BNPNo results for input(s): BNP, PROBNP in the last 168 hours.   DDimer No results for input(s): DDIMER in the last 168 hours.   Radiology    No results found.  Cardiac Studies   Echo (07/03/17): - Procedure narrative: Transthoracic echocardiography. The study was technically difficult. - Left ventricle: The cavity size was normal. There was moderate concentric hypertrophy. Systolic function was severely reduced. The estimated ejection fraction was in the range of 20% to 25%. Diffuse hypokinesis. Regional wall motion abnormalities cannot be excluded. - Left atrium: The atrium was mildly dilated. - Right ventricle: Systolic function was normal. - Pulmonary arteries: Systolic pressure could not be accurately estimated.  Patient Profile     52 y.o. female with a hx of chronic systolic heart failure due to non-ischemic cardiomyopathy, moderate mitral regurgitation, pulmonary hypertension, bipolar disorder, hepatic cirrhosis, hypertension, diabetes mellitus, obstructive sleep apnea, chronic anemia, and polysubstance abuse,admitted with chest pain and mild troponin elevation in the setting of acute on chronic systolic heart failure.  Assessment & Plan    Supply demand ischemia:  - 1 day s/p L/R heart catheterization. Normal Cors.  - Troponin mildly elevated as above and peaked 0.22- likely 2/2 supply demand ischemia in the setting of acute decompensated systolic heart failure.    Acute on chronic systolic heart failure:  - Right heart catheterization 10/28  showed severely elevated filling pressures and severely reduced cardiac output.   - Recommend continued diuresis with furosemide 40 mg intravenously twice daily as renal function continues to be stable. Continue to monitor I/O, daily weights. Yesterday's net I/O - - Started on carvedilol 6.25 mg BID and small dose of Entresto. Renal function remains stable. Ordered daily BMET as need to continue to monitor renal function closely with diuresis and start of Coreg and low dose Entresto. Low CO with diuresis - monitor vitals and document in chart.   Essential hypertension:  - Blood pressure and HR slightly elevated this AM with BP 134/97, HR 92. Continue to monitor vitals closely as previous  BP and HR within range. Continue lasix 40mg  BID and hydralazine 10mg  TID. Continue Coreg 6.25mg  BID. Started on Entresto 24/26 BID yesterday. Due to low cardiac output and as s/p cardiac cath, would recommend continue to very closely monitor vitals and renal function at this time and document in chart.   Hypokalemia - Repleting with goal 4.0 - Ordered daily BMET - Checking Mg  Anemia, mild - Hgb 10.3 with baseline ~12-13 - Recommend closely monitor  - Per IM.  Obstructive sleep apnea:  - Continue CPAP.    For questions or updates, please contact [facility] Please consult [email or web address] for contact info under        Signed, Lennon Alstrom, PA-C  06/10/2018, 7:56 AM

## 2018-06-10 NOTE — Progress Notes (Signed)
[facility] - [place] at [facility]   PATIENT NAME: Bonnie Ochoa    MR#:  [id number]  DATE OF BIRTH:  18-Mar-1966  SUBJECTIVE: Patient is feeling better.  Less shortness of breath.  CHIEF COMPLAINT:   Chief Complaint  Patient presents with  . Chest Pain    REVIEW OF SYSTEMS:   ROS CONSTITUTIONAL: No fever, fatigue or weakness.  EYES: No blurred or double vision.  EARS, NOSE, AND THROAT: No tinnitus or ear pain.  RESPIRATORY: Chest pain, shortness of breath  cARDIOVASCULAR: No chest pain, orthopnea, edema.  GASTROINTESTINAL: No nausea, vomiting, diarrhea or abdominal pain.  GENITOURINARY: No dysuria, hematuria.  ENDOCRINE: No polyuria, nocturia,  HEMATOLOGY: No anemia, easy bruising or bleeding SKIN: No rash or lesion. MUSCULOSKELETAL: No joint pain or arthritis.   NEUROLOGIC: No tingling, numbness, weakness.  PSYCHIATRY: No anxiety or depression.   DRUG ALLERGIES:   Allergies  Allergen Reactions  . Levothyroxine Rash    VITALS:  Blood pressure (!) 134/97, pulse 92, temperature 97.8 F (36.6 C), temperature source Oral, resp. rate 19, height 5\' 4"  (1.626 m), weight 89.4 kg, SpO2 (!) 89 %.  PHYSICAL EXAMINATION:  GENERAL:  52 y.o.-year-old patient lying in the bed with no acute distress.  EYES: Pupils equal, round, reactive to light and accommodation. No scleral icterus. Extraocular muscles intact.  HEENT: Head atraumatic, normocephalic. Oropharynx and nasopharynx clear.  NECK:  Supple, no jugular venous distention. No thyroid enlargement, no tenderness.  LUNGS: Normal breath sounds bilaterally, no wheezing, rales,rhonchi or crepitation. No use of accessory muscles of respiration.  CARDIOVASCULAR: S1, S2 normal. No murmurs, rubs, or gallops.  ABDOMEN: Soft, nontender, nondistended. Bowel sounds present. No organomegaly or mass.  EXTREMITIES: No pedal edema, cyanosis, or clubbing.  NEUROLOGIC: Cranial nerves II through XII are intact. Muscle  strength 5/5 in all extremities. Sensation intact. Gait not checked.  PSYCHIATRIC: The patient is alert and oriented x 3.  SKIN: No obvious rash, lesion, or ulcer.    LABORATORY PANEL:   CBC Recent Labs  Lab 06/10/18 0603  WBC 5.4  HGB 10.3*  HCT 34.3*  PLT 310   ------------------------------------------------------------------------------------------------------------------  Chemistries  Recent Labs  Lab 06/07/18 0752  06/10/18 0855  NA 137   < > 137  K 3.6   < > 3.7  CL 105   < > 102  CO2 22   < > 23  GLUCOSE 239*   < > 157*  BUN 24*   < > 18  CREATININE 1.26*   < > 1.11*  CALCIUM 8.8*   < > 9.4  AST 38  --   --   ALT 125*  --   --   ALKPHOS 103  --   --   BILITOT 1.3*  --   --    < > = values in this interval not displayed.   ------------------------------------------------------------------------------------------------------------------  Cardiac Enzymes Recent Labs  Lab 06/08/18 0506  TROPONINI 0.11*   ------------------------------------------------------------------------------------------------------------------  RADIOLOGY:  No results found.  EKG:   Orders placed or performed during the hospital encounter of 06/07/18  . EKG 12-Lead  . EKG 12-Lead  . ED EKG within 10 minutes  . ED EKG within 10 minutes    ASSESSMENT AND PLAN:   52 year old female patient seen with chest pain, shortness of breath, found to have mildly elevated troponins. 1.  Non-ST elevation MI likely due to supply, demand ischemia due to acute decompensated heart failure.   Cardiac cath normal coronaries but  pulmonary capillary wedge pressure was high so cardiologist recommended to continue diuretics. 2.  Acute on chronic systolic heart failure, IV access, Lasix changed to p.o., Coreg, hydralazine, Imdur.  Patient is on Entresto, renal function is stable .  3/ essential hypertension: Stable., #4 diabetes mellitus type 2: Controlled.    Bipolar disorder: Patient is on  Lamictal.  Paxil, Seroquel. Discussed with patient's wife at bedside.  Has history of pancreatitis, gallstones, seen by gastroenterology during last admission, history of marijuana abuse, liver cirrhosis of cardiac origin patient to have EGD, variceal screen and outpatient likely Dr. Maximino Greenland as an outpatient.   All the records are reviewed and case discussed with Care Management/Social Workerr. Management plans discussed with the patient, family and they are in agreement.  CODE STATUS: full  TOTAL TIME TAKING CARE OF THIS PATIENT: 40 minutes.    POSSIBLE D/C IN 1-2 DAYS, DEPENDING ON CLINICAL CONDITION. Possible discharge home tomorrow. Katha Hamming M.D on 06/10/2018 at 12:26 PM  Between 7am to 6pm - Pager - [phone]  After 6pm go to [email or web address] - password [email or web address] [facility]  [place] [facility]  Office  [phone]  CC: Primary care physician; Dorcas Carrow, DO   Note: This dictation was prepared with Dragon dictation along with smaller phrase technology. Any transcriptional errors that result from this process are unintentional.

## 2018-06-10 NOTE — Care Management Important Message (Signed)
Copy of signed IM left with patient in room.  

## 2018-06-11 LAB — GLUCOSE, CAPILLARY
GLUCOSE-CAPILLARY: 192 mg/dL — AB (ref 70–99)
GLUCOSE-CAPILLARY: 160 mg/dL — AB (ref 70–99)
GLUCOSE-CAPILLARY: 248 mg/dL — AB (ref 70–99)
Glucose-Capillary: 243 mg/dL — ABNORMAL HIGH (ref 70–99)

## 2018-06-11 LAB — BASIC METABOLIC PANEL
Anion gap: 12 (ref 5–15)
BUN: 20 mg/dL (ref 6–20)
CO2: 24 mmol/L (ref 22–32)
Calcium: 9.2 mg/dL (ref 8.9–10.3)
Chloride: 102 mmol/L (ref 98–111)
Creatinine, Ser: 1.16 mg/dL — ABNORMAL HIGH (ref 0.44–1.00)
GFR, EST NON AFRICAN AMERICAN: 52 mL/min — AB (ref >60)
Glucose, Bld: 180 mg/dL — ABNORMAL HIGH (ref 70–99)
POTASSIUM: 3.8 mmol/L (ref 3.5–5.1)
SODIUM: 138 mmol/L (ref 135–145)

## 2018-06-11 MED ORDER — INSULIN GLARGINE 100 UNIT/ML ~~LOC~~ SOLN
16.0000 [IU] | Freq: Every day | SUBCUTANEOUS | Status: DC
  Administered 2018-06-11: 16 [IU] via SUBCUTANEOUS
  Filled 2018-06-11 (×2): qty 0.16

## 2018-06-11 MED ORDER — POTASSIUM CHLORIDE CRYS ER 10 MEQ PO TBCR
10.0000 meq | EXTENDED_RELEASE_TABLET | Freq: Once | ORAL | Status: AC
  Administered 2018-06-11: 10 meq via ORAL
  Filled 2018-06-11: qty 1

## 2018-06-11 MED ORDER — MORPHINE SULFATE (PF) 2 MG/ML IV SOLN
1.0000 mg | INTRAVENOUS | Status: DC | PRN
  Administered 2018-06-11: 1 mg via INTRAVENOUS
  Filled 2018-06-11: qty 1

## 2018-06-11 NOTE — Progress Notes (Signed)
Progress Note  Patient Name: Floella Ensz Date of Encounter: 06/11/2018  Primary Cardiologist: Mariah Milling  Subjective   Sleepy this morning.  Shortness of breath about the same.  Denies any lower extremity swelling or abdominal distention.  Documented urine output of 200 cc for the past 24 hours with a net -1.7 L.  No weight this morning.  Serum creatinine stable at 1.16 with a prior 1.11.  Potassium 3.8.  Has been resumed on IV Lasix 40 mg twice daily to be given this morning.  Inpatient Medications    Scheduled Meds: . carvedilol  6.25 mg Oral BID  . enoxaparin (LOVENOX) injection  40 mg Subcutaneous Q24H  . famotidine  40 mg Oral QPM  . furosemide  40 mg Intravenous BID  . hydrALAZINE  10 mg Oral TID  . Influenza vac split quadrivalent PF  0.5 mL Intramuscular Tomorrow-1000  . insulin aspart  0-9 Units Subcutaneous TID WC  . insulin glargine  14 Units Subcutaneous Q2200  . isosorbide dinitrate  10 mg Oral TID  . lamoTRIgine  25 mg Oral BID  . metoCLOPramide  10 mg Oral TID WC  . pantoprazole  40 mg Oral Daily  . PARoxetine  30 mg Oral Daily  . QUEtiapine  300 mg Oral QHS  . sacubitril-valsartan  1 tablet Oral BID  . sodium chloride flush  3 mL Intravenous Q12H  . sodium chloride flush  3 mL Intravenous Q12H  . sodium chloride flush  3 mL Intravenous Q12H   Continuous Infusions: . sodium chloride     PRN Meds: sodium chloride, acetaminophen **OR** acetaminophen, cyclobenzaprine, dicyclomine, nitroGLYCERIN, ondansetron **OR** ondansetron (ZOFRAN) IV, ondansetron, sodium chloride flush, sodium chloride flush, traMADol   Vital Signs    Vitals:   06/10/18 1719 06/10/18 2028 06/11/18 0554 06/11/18 0833  BP: (!) 118/97 118/83 115/84 (!) 116/91  Pulse: 88 78 84 83  Resp:  17 16   Temp:  98 F (36.7 C) 98 F (36.7 C) 97.7 F (36.5 C)  TempSrc:  Oral Oral Oral  SpO2: 98% 93% 98% 96%  Weight:      Height:        Intake/Output Summary (Last 24 hours) at 06/11/2018  0922 Last data filed at 06/11/2018 0200 Gross per 24 hour  Intake -  Output 200 ml  Net -200 ml   Filed Weights   06/07/18 0746 06/07/18 1618 06/09/18 0953  Weight: 91.4 kg 89.7 kg 89.4 kg    Telemetry    NSR - Personally Reviewed  ECG    n/a - Personally Reviewed  Physical Exam   GEN: No acute distress.   Neck: No JVD. Cardiac: RRR, II/VI systolic murmur at the apex, no rubs, or gallops.  Respiratory:  Mildly diminished breath sounds along the bilateral bases.  GI: Soft, nontender, non-distended.   MS: No edema; No deformity. Neuro:  Alert and oriented x 3; Nonfocal.  Psych: Normal affect.  Labs    Chemistry Recent Labs  Lab 06/07/18 0752  06/09/18 0153 06/10/18 0855 06/11/18 0600  NA 137   < > 137 137 138  K 3.6   < > 3.9 3.7 3.8  CL 105   < > 106 102 102  CO2 22   < > 22 23 24   GLUCOSE 239*   < > 217* 157* 180*  BUN 24*   < > 25* 18 20  CREATININE 1.26*   < > 1.20* 1.11* 1.16*  CALCIUM 8.8*   < >  8.5* 9.4 9.2  PROT 8.1  --   --   --   --   ALBUMIN 3.8  --   --   --   --   AST 38  --   --   --   --   ALT 125*  --   --   --   --   ALKPHOS 103  --   --   --   --   BILITOT 1.3*  --   --   --   --   GFRNONAA 47*   < > 50* 55* 52*  GFRAA 55*   < > 58* >60 >60  ANIONGAP 10   < > 9 12 12    < > = values in this interval not displayed.     Hematology Recent Labs  Lab 06/08/18 0506 06/09/18 0153 06/10/18 0603  WBC 7.0 6.8 5.4  RBC 3.07* 3.00* 3.49*  HGB 9.1* 8.8* 10.3*  HCT 30.6* 29.7* 34.3*  MCV 99.7 99.0 98.3  MCH 29.6 29.3 29.5  MCHC 29.7* 29.6* 30.0  RDW 17.2* 17.1* 16.7*  PLT 291 290 310    Cardiac Enzymes Recent Labs  Lab 06/07/18 1126 06/07/18 1632 06/07/18 2201 06/08/18 0506  TROPONINI 0.22* 0.14* 0.12* 0.11*   No results for input(s): TROPIPOC in the last 168 hours.   BNPNo results for input(s): BNP, PROBNP in the last 168 hours.   DDimer No results for input(s): DDIMER in the last 168 hours.   Radiology    No results  found.  Cardiac Studies   Echo 06/08/2018: Study Conclusions  - Left ventricle: The cavity size was severely dilated. Wall   thickness was at the upper limits of normal. Systolic function   was severely reduced. The estimated ejection fraction was in the   range of 25% to 30%. Diffuse hypokinesis. Features are consistent   with a pseudonormal left ventricular filling pattern, with   concomitant abnormal relaxation and increased filling pressure   (grade 2 diastolic dysfunction). Doppler parameters are   consistent with high ventricular filling pressure. - Aortic valve: Trileaflet; mildly thickened, mildly calcified   leaflets. There was trivial regurgitation. - Mitral valve: There was at least moderate regurgitation. - Left atrium: The atrium was mildly dilated. - Right ventricle: The cavity size was mildly dilated. Wall   thickness was normal. Systolic function was mildly reduced. - Right atrium: The atrium was mildly dilated. - Tricuspid valve: There was moderate-severe regurgitation. - Pulmonary arteries: Systolic pressure was moderately increased,   in the range of 45 mm Hg to 50 mm Hg. - Inferior vena cava: The vessel was dilated. The respirophasic   diameter changes were blunted (< 50%), consistent with elevated   central venous pressure. - Pericardium, extracardiac: A small pericardial effusion was   identified. __________ [facility] 06/09/2018: Conclusion   1.  Normal coronary arteries. 2.  Severely reduced LV systolic function by echo.  Left ventricular angiography was not performed. 3.  Right heart catheterization showed severely elevated filling pressures with a wedge pressure of 32 mmHg, severe pulmonary hypertension with mean pulmonary pressure of 52 mmHg and severely reduced cardiac output at 3.04 with a cardiac index of 1.56.  Recommendations: The patient has nonischemic cardiomyopathy. She continues to be severely volume overloaded.  I am going to resume IV  diuresis. I elected to decrease the dose of carvedilol to 6.25 mg twice daily in order to allow initiation of Entresto. If renal function worsens with diuresis, the patient  might need short-term inotrope support.    Patient Profile     52 y.o. female with history of chronic systolic CHF due to NICM s/p ICD,pulmonary hypertension, moderate mitral regurgitation,polysubstance abuse, noncompliance, bipolar, anxiety, cirrhosis with ascites requiring prior paracentesis, DM, HTN, asthma, OSA, RLS, and anemia  admitted with chest pain and mild elevated troponin in setting of acute chronic systolic CHF.  Assessment & Plan    1.  Acute on chronic systolic CHF secondary to an ICM: -She remains volume overloaded and will require at least another day or 2 of inpatient, IV diuresis prior to transitioning to p.o. Diuretic -Right heart cath on 10/28 demonstrated severely elevated filling pressures with a wedge pressure of 32, severe pulmonary hypertension with a mean pulmonary pressure of 52 and a severely reduced cardiac output at 3.04 with a cardiac 0.56 -Continue carvedilol, Entresto, hydralazine/Isordil -Continue IV Lasix 40 mg twice daily with KCl repletion -CHF education -Daily weights with strict I's and O's  2.  Demand ischemia: -Troponin mildly elevated with a peak of 0.22 and down trending -Left heart cath on 10/28 demonstrating normal coronary arteries -Continue medical management with risk factor reduction  3.  Hypertension: -Blood pressure well controlled -Continue current medications as above  4.  Hypokalemia: -Replete to goal 4.0  5.  Anemia: -Improved on 10/29 -Per IM  6.  OSA: -CPAP  For questions or updates, please contact [facility] Please consult [email or web address] for contact info under Cardiology/STEMI.    Signed, Eula Listen, PA-C [facility] Pager: [phone] 06/11/2018, 9:22 AM

## 2018-06-11 NOTE — Progress Notes (Signed)
MD notified: Pt complaining of 10/10 left sided chest pain. VS WNL. Pt denies any shortness of breath or other symptoms.  Return call from MD: 1mg  Morphine IV every 4 hours PRN severe pain ordered

## 2018-06-11 NOTE — Progress Notes (Signed)
[facility] - [place] at [facility]   PATIENT NAME: Bonnie Ochoa    MR#:  [id number]  DATE OF BIRTH:  [birth year]  SUBJECTIVE: Patient is feeling better.  Less shortness of breath.  CHIEF COMPLAINT:   Chief Complaint  Patient presents with  . Chest Pain    REVIEW OF SYSTEMS:   ROS CONSTITUTIONAL: No fever, fatigue or weakness.  EYES: No blurred or double vision.  EARS, NOSE, AND THROAT: No tinnitus or ear pain.  RESPIRATORY: Chest pain, shortness of breath  cARDIOVASCULAR: No chest pain, orthopnea, edema.  GASTROINTESTINAL: No nausea, vomiting, diarrhea or abdominal pain.  GENITOURINARY: No dysuria, hematuria.  ENDOCRINE: No polyuria, nocturia,  HEMATOLOGY: No anemia, easy bruising or bleeding SKIN: No rash or lesion. MUSCULOSKELETAL: No joint pain or arthritis.   NEUROLOGIC: No tingling, numbness, weakness.  PSYCHIATRY: No anxiety or depression.   DRUG ALLERGIES:   Allergies  Allergen Reactions  . Levothyroxine Rash    VITALS:  Blood pressure (!) 116/91, pulse 83, temperature 97.7 F (36.5 C), temperature source Oral, resp. rate 16, height 5\' 4"  (1.626 m), weight 89.4 kg, SpO2 96 %.  PHYSICAL EXAMINATION:  GENERAL:  52 y.o.-year-old patient lying in the bed with no acute distress.  EYES: Pupils equal, round, reactive to light and accommodation. No scleral icterus. Extraocular muscles intact.  HEENT: Head atraumatic, normocephalic. Oropharynx and nasopharynx clear.  NECK:  Supple, no jugular venous distention. No thyroid enlargement, no tenderness.  LUNGS: Normal breath sounds bilaterally, no wheezing, rales,rhonchi or crepitation. No use of accessory muscles of respiration.  CARDIOVASCULAR: S1, S2 normal. No murmurs, rubs, or gallops.  ABDOMEN: Soft, nontender, nondistended. Bowel sounds present. No organomegaly or mass.  EXTREMITIES: No pedal edema, cyanosis, or clubbing.  NEUROLOGIC: Cranial nerves II through XII are intact. Muscle  strength 5/5 in all extremities. Sensation intact. Gait not checked.  PSYCHIATRIC: The patient is alert and oriented x 3.  SKIN: No obvious rash, lesion, or ulcer.    LABORATORY PANEL:   CBC Recent Labs  Lab 06/10/18 0603  WBC 5.4  HGB 10.3*  HCT 34.3*  PLT 310   ------------------------------------------------------------------------------------------------------------------  Chemistries  Recent Labs  Lab 06/07/18 0752  06/10/18 0855 06/11/18 0600  NA 137   < > 137 138  K 3.6   < > 3.7 3.8  CL 105   < > 102 102  CO2 22   < > 23 24  GLUCOSE 239*   < > 157* 180*  BUN 24*   < > 18 20  CREATININE 1.26*   < > 1.11* 1.16*  CALCIUM 8.8*   < > 9.4 9.2  MG  --   --  2.1  --   AST 38  --   --   --   ALT 125*  --   --   --   ALKPHOS 103  --   --   --   BILITOT 1.3*  --   --   --    < > = values in this interval not displayed.   ------------------------------------------------------------------------------------------------------------------  Cardiac Enzymes Recent Labs  Lab 06/08/18 0506  TROPONINI 0.11*   ------------------------------------------------------------------------------------------------------------------  RADIOLOGY:  No results found.  EKG:   Orders placed or performed during the hospital encounter of 06/07/18  . EKG 12-Lead  . EKG 12-Lead  . ED EKG within 10 minutes  . ED EKG within 10 minutes    ASSESSMENT AND PLAN:   52 year old female patient seen with chest pain,  shortness of breath, found to have mildly elevated troponins. 1.  Non-ST elevation MI likely due to supply, demand ischemia due to acute decompensated heart failure.   Cardiac cath normal coronaries but pulmonary capillary wedge pressure was high so cardiologist recommended to continue diuretics. 2.  Acute on chronic systolic heart failure; continue IV Lasix for 1 more day, seen by cardiology.  Patient feels much better.  \.  3/ essential hypertension: Stable., #4 diabetes  mellitus type 2: Uncontrolled, blood sugar is been running high the average more than 170.  Increase the dose of Lantus to 16 units at night. #5 hypokalemia, replace potassium. Bipolar disorder: Patient is on Lamictal.  Paxil, Seroquel. Discussed with patient's wife at bedside.  Has history of pancreatitis, gallstones, seen by gastroenterology during last admission, history of marijuana abuse, liver cirrhosis of cardiac origin patient to have EGD, variceal screen and outpatient likely Dr. Maximino Greenland as an outpatient.   All the records are reviewed and case discussed with Care Management/Social Workerr. Management plans discussed with the patient, family and they are in agreement.  CODE STATUS: full  TOTAL TIME TAKING CARE OF THIS PATIENT: 40 minutes.    POSSIBLE D/C IN 1-2 DAYS, DEPENDING ON CLINICAL CONDITION. Possible discharge home tomorrow. Katha Hamming M.D on 06/11/2018 at 12:09 PM  Between 7am to 6pm - Pager - [phone]  After 6pm go to [email or web address] - password [email or web address]  [place] [facility]  Office  [phone]  CC: Primary care physician; Dorcas Carrow, DO   Note: This dictation was prepared with Dragon dictation along with smaller phrase technology. Any transcriptional errors that result from this process are unintentional.

## 2018-06-11 NOTE — Plan of Care (Signed)
  Problem: Education: Goal: Knowledge of General Education information will improve Description Including pain rating scale, medication(s)/side effects and non-pharmacologic comfort measures Outcome: Progressing   Problem: Health Behavior/Discharge Planning: Goal: Ability to manage health-related needs will improve Outcome: Progressing   Problem: Clinical Measurements: Goal: Ability to maintain clinical measurements within normal limits will improve Outcome: Progressing Goal: Will remain free from infection Outcome: Progressing Goal: Diagnostic test results will improve Outcome: Progressing Goal: Respiratory complications will improve Outcome: Progressing Goal: Cardiovascular complication will be avoided Outcome: Progressing   Problem: Activity: Goal: Risk for activity intolerance will decrease Outcome: Progressing   Problem: Nutrition: Goal: Adequate nutrition will be maintained Outcome: Progressing   Problem: Coping: Goal: Level of anxiety will decrease Outcome: Progressing   Problem: Elimination: Goal: Will not experience complications related to bowel motility Outcome: Progressing Goal: Will not experience complications related to urinary retention Outcome: Progressing   Problem: Pain Managment: Goal: General experience of comfort will improve Outcome: Progressing   Problem: Safety: Goal: Ability to remain free from injury will improve Outcome: Progressing   Problem: Skin Integrity: Goal: Risk for impaired skin integrity will decrease Outcome: Progressing   Problem: Education: Goal: Understanding of CV disease, CV risk reduction, and recovery process will improve Outcome: Progressing Goal: Individualized Educational Video(s) Outcome: Progressing   Problem: Activity: Goal: Ability to return to baseline activity level will improve Outcome: Progressing   Problem: Cardiovascular: Goal: Ability to achieve and maintain adequate cardiovascular perfusion  will improve Outcome: Progressing Goal: Vascular access site(s) Level 0-1 will be maintained Outcome: Progressing   Problem: Health Behavior/Discharge Planning: Goal: Ability to safely manage health-related needs after discharge will improve Outcome: Progressing   Problem: Education: Goal: Ability to demonstrate management of disease process will improve Outcome: Progressing Goal: Ability to verbalize understanding of medication therapies will improve Outcome: Progressing   Problem: Activity: Goal: Capacity to carry out activities will improve Outcome: Progressing   Problem: Cardiac: Goal: Ability to achieve and maintain adequate cardiopulmonary perfusion will improve Outcome: Progressing

## 2018-06-12 ENCOUNTER — Telehealth: Payer: Self-pay

## 2018-06-12 ENCOUNTER — Ambulatory Visit (INDEPENDENT_AMBULATORY_CARE_PROVIDER_SITE_OTHER): Payer: Medicare Other | Admitting: *Deleted

## 2018-06-12 DIAGNOSIS — I428 Other cardiomyopathies: Secondary | ICD-10-CM | POA: Diagnosis not present

## 2018-06-12 LAB — GLUCOSE, CAPILLARY
GLUCOSE-CAPILLARY: 150 mg/dL — AB (ref 70–99)
GLUCOSE-CAPILLARY: 252 mg/dL — AB (ref 70–99)
GLUCOSE-CAPILLARY: 138 mg/dL — AB (ref 70–99)
Glucose-Capillary: 243 mg/dL — ABNORMAL HIGH (ref 70–99)

## 2018-06-12 LAB — BASIC METABOLIC PANEL
ANION GAP: 12 (ref 5–15)
BUN: 27 mg/dL — ABNORMAL HIGH (ref 6–20)
CHLORIDE: 100 mmol/L (ref 98–111)
CO2: 23 mmol/L (ref 22–32)
Calcium: 9 mg/dL (ref 8.9–10.3)
Creatinine, Ser: 1.34 mg/dL — ABNORMAL HIGH (ref 0.44–1.00)
GFR calc non Af Amer: 44 mL/min — ABNORMAL LOW (ref >60)
GFR, EST AFRICAN AMERICAN: 51 mL/min — AB (ref >60)
Glucose, Bld: 175 mg/dL — ABNORMAL HIGH (ref 70–99)
POTASSIUM: 3.7 mmol/L (ref 3.5–5.1)
SODIUM: 135 mmol/L (ref 135–145)

## 2018-06-12 MED ORDER — MECLIZINE HCL 12.5 MG PO TABS
12.5000 mg | ORAL_TABLET | Freq: Two times a day (BID) | ORAL | Status: DC | PRN
  Filled 2018-06-12: qty 1

## 2018-06-12 MED ORDER — INSULIN ASPART 100 UNIT/ML ~~LOC~~ SOLN
3.0000 [IU] | Freq: Three times a day (TID) | SUBCUTANEOUS | Status: DC
  Administered 2018-06-12 – 2018-06-13 (×3): 3 [IU] via SUBCUTANEOUS
  Filled 2018-06-12 (×3): qty 1

## 2018-06-12 MED ORDER — SODIUM CHLORIDE 0.9 % IV BOLUS
500.0000 mL | Freq: Once | INTRAVENOUS | Status: AC
  Administered 2018-06-12: 500 mL via INTRAVENOUS

## 2018-06-12 MED ORDER — INSULIN GLARGINE 100 UNIT/ML ~~LOC~~ SOLN
18.0000 [IU] | Freq: Every day | SUBCUTANEOUS | Status: DC
  Administered 2018-06-12: 18 [IU] via SUBCUTANEOUS
  Filled 2018-06-12 (×2): qty 0.18

## 2018-06-12 NOTE — Care Management Note (Signed)
Case Management Note  Patient Details  Name: Bonnie Ochoa MRN: [id number] Date of Birth: 07-13-1966  Subjective/Objective:    Patient is from home; independent.   Admitted for NSTEMI with acute on chronic CHF.  Has been diuresing with IV lasix.  Hypotensive today, beta- blockers held.  Independent in all adls, denies issues accessing medical care, obtaining medications or with transportation.  Current with PCP.  [facility] with [facility].  Also making [facility] referral.  No discharge needs identified at present by care manager or members of care team.               Action/Plan:   Expected Discharge Date:                  Expected Discharge Plan:  Home/Self Care  In-House Referral:     Discharge planning Services  CM Consult  Post Acute Care Choice:    Choice offered to:     DME Arranged:    DME Agency:     HH Arranged:    HH Agency:     Status of Service:  In process, will continue to follow  If discussed at Long Length of Stay Meetings, dates discussed:    Additional Comments:  Sherren Kerns, RN 06/12/2018, 3:11 PM

## 2018-06-12 NOTE — Progress Notes (Signed)
[facility]   PATIENT NAME: Bonnie Ochoa    MR#:  [id number]  DATE OF BIRTH:  12-07-1965  SUBJECTIVE: Recent is hypotensive overnight.  Denies any shortness of breath but does feel some dizziness.  CHIEF COMPLAINT:   Chief Complaint  Patient presents with  . Chest Pain   Chest pain yesterday.  Resolved now. REVIEW OF SYSTEMS:   ROS CONSTITUTIONAL: No fever,  dizzy.  EYES: No blurred or double vision.  EARS, NOSE, AND THROAT: No tinnitus or ear pain.  RESPIRATORY: Chest pain, shortness of breath  cARDIOVASCULAR: No chest pain, orthopnea, edema.  GASTROINTESTINAL: No nausea, vomiting, diarrhea or abdominal pain.  GENITOURINARY: No dysuria, hematuria.  ENDOCRINE: No polyuria, nocturia,  HEMATOLOGY: No anemia, easy bruising or bleeding SKIN: No rash or lesion. MUSCULOSKELETAL: No joint pain or arthritis.   NEUROLOGIC: No tingling, numbness, weakness.  PSYCHIATRY: No anxiety or depression.   DRUG ALLERGIES:   Allergies  Allergen Reactions  . Levothyroxine Rash    VITALS:  Blood pressure 99/69, pulse 82, temperature 98 F (36.7 C), temperature source Oral, resp. rate 19, height 5\' 4"  (1.626 m), weight 86.2 kg, SpO2 94 %.  PHYSICAL EXAMINATION:  GENERAL:  52 y.o.-year-old patient lying in the bed with no acute distress.  EYES: Pupils equal, round, reactive to light and accommodation. No scleral icterus. Extraocular muscles intact.  HEENT: Head atraumatic, normocephalic. Oropharynx and nasopharynx clear.  NECK:  Supple, no jugular venous distention. No thyroid enlargement, no tenderness.  LUNGS: Normal breath sounds bilaterally, no wheezing, rales,rhonchi or crepitation. No use of accessory muscles of respiration.  CARDIOVASCULAR: S1, S2 normal. No murmurs, rubs, or gallops.  ABDOMEN: Soft, nontender, nondistended. Bowel sounds present. No organomegaly or mass.  EXTREMITIES: No pedal edema, cyanosis, or clubbing.  NEUROLOGIC:  Cranial nerves II through XII are intact. Muscle strength 5/5 in all extremities. Sensation intact. Gait not checked.  PSYCHIATRIC: The patient is alert and oriented x 3.  SKIN: No obvious rash, lesion, or ulcer.    LABORATORY PANEL:   CBC Recent Labs  Lab 06/10/18 0603  WBC 5.4  HGB 10.3*  HCT 34.3*  PLT 310   ------------------------------------------------------------------------------------------------------------------  Chemistries  Recent Labs  Lab 06/07/18 0752  06/10/18 0855  06/12/18 0435  NA 137   < > 137   < > 135  K 3.6   < > 3.7   < > 3.7  CL 105   < > 102   < > 100  CO2 22   < > 23   < > 23  GLUCOSE 239*   < > 157*   < > 175*  BUN 24*   < > 18   < > 27*  CREATININE 1.26*   < > 1.11*   < > 1.34*  CALCIUM 8.8*   < > 9.4   < > 9.0  MG  --   --  2.1  --   --   AST 38  --   --   --   --   ALT 125*  --   --   --   --   ALKPHOS 103  --   --   --   --   BILITOT 1.3*  --   --   --   --    < > = values in this interval not displayed.   ------------------------------------------------------------------------------------------------------------------  Cardiac Enzymes Recent Labs  Lab 06/08/18 0506  TROPONINI 0.11*   ------------------------------------------------------------------------------------------------------------------  RADIOLOGY:  No results found.  EKG:   Orders placed or performed during the hospital encounter of 06/07/18  . EKG 12-Lead  . EKG 12-Lead  . ED EKG within 10 minutes  . ED EKG within 10 minutes    ASSESSMENT AND PLAN:   52 year old female patient seen with chest pain, shortness of breath, found to have mildly elevated troponins. 1.  Non-ST elevation MI likely due to supply, demand ischemia due to acute decompensated heart failure.   Cardiac cath normal coronaries . 2.  Acute on chronic systolic heart failure; hypotension, hold diuretics today.  slight bump in creatinine, narrow euvolemic in window. \.  3/ essential  hypertension ; hypotensive today, hold beta-blockers,  #4 diabetes mellitus type 2: Uncontrolled, blood sugar is been running high the average more than 170.  Increase the dose of Lantus to 18 units at night. #5. hypokalemia, replaced potassium.  Potassium better today. Bipolar disorder: Patient is on Lamictal.  Paxil, Seroquel. Discussed with patient's wife at bedside.  Has history of pancreatitis, gallstones, seen by gastroenterology during last admission, history of marijuana abuse, liver cirrhosis of cardiac origin patient to have EGD, variceal screen and outpatient likely Dr. Maximino Greenland as an outpatient.   All the records are reviewed and case discussed with Care Management/Social Workerr. Management plans discussed with the patient, family and they are in agreement.  CODE STATUS: full  TOTAL TIME TAKING CARE OF THIS PATIENT: 40 minutes.    POSSIBLE D/C IN 1-2 DAYS, DEPENDING ON CLINICAL CONDITION. Possible discharge home tomorrow. Katha Hamming M.D on 06/12/2018 at 12:02 PM  Between 7am to 6pm - Pager - [phone]  After 6pm go to [email or web address] - password [email or web address] [facility]  [facility]  Office  [phone]  CC: Primary care physician; Dorcas Carrow, DO   Note: This dictation was prepared with Dragon dictation along with smaller phrase technology. Any transcriptional errors that result from this process are unintentional.

## 2018-06-12 NOTE — Progress Notes (Signed)
Inpatient Diabetes Program Recommendations  AACE/ADA: New Consensus Statement on Inpatient Glycemic Control (2015)  Target Ranges:  Prepandial:   less than 140 mg/dL      Peak postprandial:   less than 180 mg/dL (1-2 hours)      Critically ill patients:  140 - 180 mg/dL   Results for Bonnie Ochoa, Bonnie Ochoa (MRN [id number]) as of 06/12/2018 12:51  Ref. Range 06/11/2018 08:35 06/11/2018 12:01 06/11/2018 17:26 06/11/2018 21:03  Glucose-Capillary Latest Ref Range: 70 - 99 mg/dL 656 (H)  2 units NOVOLOG  243 (H)  3 units NOVOLOG  160 (H)  2 units NOVOLOG  248 (H)    16 units LANTUS   Results for Bonnie Ochoa, Bonnie Ochoa (MRN [id number]) as of 06/12/2018 12:51  Ref. Range 06/12/2018 08:17 06/12/2018 11:18  Glucose-Capillary Latest Ref Range: 70 - 99 mg/dL 174 (H)  1 unit NOVOLOG  252 (H)  5 units NOVOLOG      Home DM Meds: Lantus 20 unitsQHS  Current Orders: Lantus 18 units QHS       Novolog Sensitive Correction Scale/ SSI (0-9 units) TID AC      MD- Note Lantus dose increased for tonight.  Please also consider starting low dose Novolog Meal Coverage: Novolog 3 units TID with meals  (Please add the following Hold Parameters: Hold if pt eats <50% of meal, Hold if pt NPO)     --Will follow patient during hospitalization--  Ambrose Finland RN, MSN, CDE Diabetes Coordinator Inpatient Glycemic Control Team Team Pager: [phone] (8a-5p)

## 2018-06-12 NOTE — Plan of Care (Signed)
Blood pressure remains WNL, No complaints of SOB noted.

## 2018-06-12 NOTE — Telephone Encounter (Signed)
Spoke with pt and reminded pt of remote transmission that is due today. Pt verbalized understanding.   

## 2018-06-12 NOTE — Progress Notes (Signed)
Progress Note  Patient Name: Bonnie Ochoa Date of Encounter: 06/12/2018  Primary Cardiologist: Julien Nordmann, MD   Subjective   Blood pressure has been running low overnight and she complains of dizziness.  She had an episode of chest pain yesterday.  Inpatient Medications    Scheduled Meds: . carvedilol  6.25 mg Oral BID  . enoxaparin (LOVENOX) injection  40 mg Subcutaneous Q24H  . famotidine  40 mg Oral QPM  . Influenza vac split quadrivalent PF  0.5 mL Intramuscular Tomorrow-1000  . insulin aspart  0-9 Units Subcutaneous TID WC  . insulin glargine  16 Units Subcutaneous Q2200  . lamoTRIgine  25 mg Oral BID  . metoCLOPramide  10 mg Oral TID WC  . pantoprazole  40 mg Oral Daily  . PARoxetine  30 mg Oral Daily  . QUEtiapine  300 mg Oral QHS  . sacubitril-valsartan  1 tablet Oral BID  . sodium chloride flush  3 mL Intravenous Q12H  . sodium chloride flush  3 mL Intravenous Q12H  . sodium chloride flush  3 mL Intravenous Q12H   Continuous Infusions: . sodium chloride     PRN Meds: sodium chloride, acetaminophen **OR** acetaminophen, cyclobenzaprine, dicyclomine, meclizine, morphine injection, nitroGLYCERIN, ondansetron **OR** ondansetron (ZOFRAN) IV, ondansetron, sodium chloride flush, sodium chloride flush, traMADol   Vital Signs    Vitals:   06/12/18 0508 06/12/18 0510 06/12/18 0542 06/12/18 0712  BP: (!) 83/53 (!) 90/59  94/63  Pulse: 83 86  84  Resp:    19  Temp:    98 F (36.7 C)  TempSrc:    Oral  SpO2:    94%  Weight:   86.2 kg   Height:        Intake/Output Summary (Last 24 hours) at 06/12/2018 0829 Last data filed at 06/12/2018 0533 Gross per 24 hour  Intake 546.63 ml  Output 2000 ml  Net -1453.37 ml   Filed Weights   06/09/18 0953 06/11/18 1944 06/12/18 0542  Weight: 89.4 kg 86.2 kg 86.2 kg    Telemetry    Normal sinus rhythm with PVCs.- Personally Reviewed  ECG     - Personally Reviewed  Physical Exam   GEN: No acute distress.     Neck:  Jugular venous pressure is not well visualized. Cardiac: RRR, no murmurs, rubs, or gallops.  Respiratory: Clear to auscultation bilaterally.   GI: Soft, nontender, non-distended  MS: No edema; No deformity. Neuro:  Nonfocal  Psych: Normal affect   Labs    Chemistry Recent Labs  Lab 06/07/18 0752  06/10/18 0855 06/11/18 0600 06/12/18 0435  NA 137   < > 137 138 135  K 3.6   < > 3.7 3.8 3.7  CL 105   < > 102 102 100  CO2 22   < > 23 24 23   GLUCOSE 239*   < > 157* 180* 175*  BUN 24*   < > 18 20 27*  CREATININE 1.26*   < > 1.11* 1.16* 1.34*  CALCIUM 8.8*   < > 9.4 9.2 9.0  PROT 8.1  --   --   --   --   ALBUMIN 3.8  --   --   --   --   AST 38  --   --   --   --   ALT 125*  --   --   --   --   ALKPHOS 103  --   --   --   --  BILITOT 1.3*  --   --   --   --   GFRNONAA 47*   < > 55* 52* 44*  GFRAA 55*   < > >60 >60 51*  ANIONGAP 10   < > 12 12 12    < > = values in this interval not displayed.     Hematology Recent Labs  Lab 06/08/18 0506 06/09/18 0153 06/10/18 0603  WBC 7.0 6.8 5.4  RBC 3.07* 3.00* 3.49*  HGB 9.1* 8.8* 10.3*  HCT 30.6* 29.7* 34.3*  MCV 99.7 99.0 98.3  MCH 29.6 29.3 29.5  MCHC 29.7* 29.6* 30.0  RDW 17.2* 17.1* 16.7*  PLT 291 290 310    Cardiac Enzymes Recent Labs  Lab 06/07/18 1126 06/07/18 1632 06/07/18 2201 06/08/18 0506  TROPONINI 0.22* 0.14* 0.12* 0.11*   No results for input(s): TROPIPOC in the last 168 hours.   BNPNo results for input(s): BNP, PROBNP in the last 168 hours.   DDimer No results for input(s): DDIMER in the last 168 hours.   Radiology    No results found.  Cardiac Studies   Echo (07/03/17): - Procedure narrative: Transthoracic echocardiography. The study was technically difficult. - Left ventricle: The cavity size was normal. There was moderate concentric hypertrophy. Systolic function was severely reduced. The estimated ejection fraction was in the range of 20% to 25%. Diffuse hypokinesis.  Regional wall motion abnormalities cannot be excluded. - Left atrium: The atrium was mildly dilated. - Right ventricle: Systolic function was normal. - Pulmonary arteries: Systolic pressure could not be accurately estimated.  Patient Profile     52 y.o. female with a hx of chronic systolic heart failure due to non-ischemic cardiomyopathy, moderate mitral regurgitation, pulmonary hypertension, bipolar disorder, hepatic cirrhosis, hypertension, diabetes mellitus, obstructive sleep apnea, chronic anemia, and polysubstance abuse,admitted with chest pain and mild troponin elevation in the setting of acute on chronic systolic heart failure.  Assessment & Plan    1.  Acute on chronic systolic heart failure: The patient's blood pressure has been trending down with slight worsening of renal function. I am going to hold diuresis today.  Hopefully we can resume oral furosemide tomorrow. I suspect that she has narrow euvolemic window.  The patient had acute renal failure in September in the setting of diuretic therapy. Given low blood pressure, elected to stop hydralazine and Imdur. I placed parameters on Entresto. Possible discharge home tomorrow.   2.  Supply demand ischemia: Elevated troponin is due to supply demand ischemia in the setting of acute decompensated systolic heart failure.  Cardiac catheterization during this admission showed normal coronary arteries.  3.  Essential hypertension: Blood pressure is low.  I adjusted her medications as outlined above.  4.  Obstructive sleep apnea: Continue CPAP.     For questions or updates, please contact [facility] Please consult [email or web address] for contact info under        Signed, Lorine Bears, MD  06/12/2018, 8:29 AM

## 2018-06-12 NOTE — Plan of Care (Signed)
  Problem: Education: Goal: Knowledge of General Education information will improve Description Including pain rating scale, medication(s)/side effects and non-pharmacologic comfort measures Outcome: Progressing   Problem: Health Behavior/Discharge Planning: Goal: Ability to manage health-related needs will improve Outcome: Progressing   Problem: Clinical Measurements: Goal: Ability to maintain clinical measurements within normal limits will improve Outcome: Progressing Goal: Will remain free from infection Outcome: Progressing Goal: Diagnostic test results will improve Outcome: Progressing Goal: Respiratory complications will improve Outcome: Progressing Goal: Cardiovascular complication will be avoided Outcome: Progressing   Problem: Activity: Goal: Risk for activity intolerance will decrease Outcome: Progressing   Problem: Nutrition: Goal: Adequate nutrition will be maintained Outcome: Progressing   Problem: Coping: Goal: Level of anxiety will decrease Outcome: Progressing   Problem: Elimination: Goal: Will not experience complications related to bowel motility Outcome: Progressing Goal: Will not experience complications related to urinary retention Outcome: Progressing   Problem: Pain Managment: Goal: General experience of comfort will improve Outcome: Progressing   Problem: Safety: Goal: Ability to remain free from injury will improve Outcome: Progressing   Problem: Skin Integrity: Goal: Risk for impaired skin integrity will decrease Outcome: Progressing   Problem: Education: Goal: Understanding of CV disease, CV risk reduction, and recovery process will improve Outcome: Progressing Goal: Individualized Educational Video(s) Outcome: Progressing   Problem: Activity: Goal: Ability to return to baseline activity level will improve Outcome: Progressing   Problem: Cardiovascular: Goal: Ability to achieve and maintain adequate cardiovascular perfusion  will improve Outcome: Progressing Goal: Vascular access site(s) Level 0-1 will be maintained Outcome: Progressing   Problem: Health Behavior/Discharge Planning: Goal: Ability to safely manage health-related needs after discharge will improve Outcome: Progressing   Problem: Education: Goal: Ability to demonstrate management of disease process will improve Outcome: Progressing Goal: Ability to verbalize understanding of medication therapies will improve Outcome: Progressing   Problem: Activity: Goal: Capacity to carry out activities will improve Outcome: Progressing   Problem: Cardiac: Goal: Ability to achieve and maintain adequate cardiopulmonary perfusion will improve Outcome: Progressing

## 2018-06-13 ENCOUNTER — Telehealth: Payer: Self-pay

## 2018-06-13 ENCOUNTER — Telehealth: Payer: Self-pay | Admitting: Cardiovascular Disease

## 2018-06-13 ENCOUNTER — Encounter: Payer: Self-pay | Admitting: Dietician

## 2018-06-13 LAB — GLUCOSE, CAPILLARY
GLUCOSE-CAPILLARY: 148 mg/dL — AB (ref 70–99)
GLUCOSE-CAPILLARY: 213 mg/dL — AB (ref 70–99)

## 2018-06-13 MED ORDER — FUROSEMIDE 40 MG PO TABS: 40.0000 mg | ORAL_TABLET | Freq: Every day | ORAL | Status: DC

## 2018-06-13 NOTE — Progress Notes (Addendum)
Cardiovascular and Pulmonary Nurse Navigator Note:   52 year old female with a history of chronic systolic heart failure due to NICM, moderate mitral regurgitation, pulmonary HTN, bipolar disorder, hepatic cirrhosis, HTN, DM, OSA, chronic anemia, and polysubstance abuse, who was admitted with dx of acute on chronic systolic heart failure.   ? CHF Education:?? Educational session with patient completed. CHF is not a new diagnosis for this patient.   Provided patient with "Living Better with Heart Failure" packet. Briefly reviewed definition of heart failure and signs and symptoms of an exacerbation.?Explained to patient that HF is a chronic illness which requires self-assessment / self-management along with help from the cardiologist/PCP.?? ? *Reviewed importance of and reason behind checking weight daily in the AM, after using the bathroom, but before getting dressed. Patient has scales. Patient reports she weighs herself daily.   ? *Reviewed with patient the following information: *Discussed when to call the Dr= weight gain of >2-3lb overnight of 5lb in a week,  *Discussed yellow zone= call MD: weight gain of >2-3lb overnight of 5lb in a week, increased swelling, increased SOB when lying down, chest discomfort, dizziness, increased fatigue *Red Zone= call 911: struggle to breath, fainting or near fainting, significant chest pain   *Reviewed low sodium diet-provided handout of recommended and not recommended foods.? Dietitian Consultation for education completed today. Patient stated she does not do the cooking, but her wife does.  Patient reports they do not add salt and use Ms[employer] Dash and Salt Substitute. This RN cautioned patient about using salt substitute due to some salt substitutes having sodium and / or potassium.   ? *Discussed fluid intake with patient as well. Patient not currently on a fluid restriction, but advised no more than 8-8 ounces glass of fluids per day.? ? *Instructed patient  to take medications as prescribed for heart failure. Explained briefly why pt is on the medications (either make you feel better, live longer or keep you out of the hospital) and discussed monitoring and side effects.  ? *Discussed the benefits of exercise.  Informed patient under Medicare guidelines with an EF of 25-30% she is a candidate for Cardiac Rehab.   Referral entered for Cardiac Rehab.  Explained to patient per Medicare guidelines she will need to be out of the hospital and stable on heart failure medications for 6 weeks before participating in Cardiac Rehab.      *Smoking Cessation- Patient is a former smoker.? ? *[facility] - Patient is an established patient in the [facility].  Dr. Mariah Milling is patient's primary cardiologist.  F/U appointment in the Heart Failure Clinic scheduled for 06/18/2018 at 1:40 p.m.   ? Again, the 5 Steps to Living Better with Heart Failure were reviewed with patient.   [facility] [place] - Support Group for Women Living with Heart Disease. Information provided. Next meeting June 24, 2018 at 11:30 a.m.  Patient invited and encouraged to attend. Brochure given.   Patient thanked me for providing the above information. ? ? Army Melia, RN, BSN, [facility]? [facility]  [facility] Cardiac &?Pulmonary Rehab  Cardiovascular &?Pulmonary Nurse Navigator  Direct Line: [phone]  Department Phone #: [phone] Fax: [phone]? Email Address: [email or web address]    ?

## 2018-06-13 NOTE — Discharge Instructions (Signed)
Cardiac Rehabilitation  What is cardiac rehabilitation?  Cardiac rehabilitation is a treatment program that helps improve the health and well-being of people who have heart problems. Cardiac rehabilitation includes exercise training, education, and counseling to help you get stronger and return to an active lifestyle. This program can help you get better faster and reduce any future hospital stays.  Why might I need cardiac rehabilitation?    Cardiac rehabilitation programs can help when you have or have had:   A heart attack.   Heart failure.   Peripheral artery disease.   Coronary artery disease.   Angina.   Lung or breathing problems.    Cardiac rehabilitation programs are also used when you have had:   Coronary artery bypass graft surgery.   Heart valve replacement.   Heart stent placement.   Heart transplant.   Aneurysm repair.    What are the benefits of cardiac rehabilitation?  Cardiac rehabilitation can help:   Reduce problems like chest pain and trouble breathing.   Change risk factors that contribute to heart disease, such as:  ? Smoking.  ? High blood pressure.  ? High cholesterol.  ? Diabetes.  ? Being out of shape or not active.  ? Weighing more than 30% higher than your ideal weight.  ? Diet.   Improve your mental outlook so you feel:  ? More hopeful.  ? Better about yourself.  ? More confident about taking care of yourself.   Get support from health experts as well as other people with similar problems.   Learn how to manage and understand your medicines.   Teach your family about your condition and how to participate in your recovery.    What happens in cardiac rehabilitation?  You will be assessed by a cardiac rehabilitation team. They will check your health history and do a physical exam. You may need blood tests, stress tests, and other evaluations to make sure that you are ready to start cardiac rehabilitation.  The cardiac rehabilitation team works with you to make a plan based  on your health and goals. Your program will be tailored to fit you and your needs and may change as you progress. You may work with a health care team that includes:   Doctors.   Nurses.   Dietitians.   Psychologists.   Exercise specialists.   Physical and occupational therapists.    What are the phases of cardiac rehabilitation?  A cardiac rehabilitation program is often divided into phases. You advance from one phase to the next.  Phase One  This phase starts while you are still in the hospital. You may start by walking in your room and then in the hall. You may start some simple exercises with a therapist.  Phase Two  This phase begins when you go home or to another facility. This phase may last 8-12 weeks. You will travel to a cardiac rehabilitation center or another place where rehabilitation is offered. You will slowly increase your activity level while being closely watched by a nurse or therapist. Exercises may include a combination of strength or resistance training and "cardio" or aerobic movement on a treadmill or other machines. Your condition will determine how often and how long these sessions last.  In phase two, you may learn how to cook healthy meals, control your blood sugar, and manage your medicines. You may need help with scheduling or planning how and when to take your medicines. If you have questions about your medicines, it is   very important that you talk to your health care provider.  Phase Three  This phase continues for the rest of your life. There will be less supervision. You may still participate in cardiac rehabilitation activities or become part of a group in your community. You may benefit from talking about your experience with other people who are facing similar challenges.  Get help right away if:   You have severe chest discomfort, especially if the pain is crushing or pressure-like and spreads to your arms, back, neck, or jaw. Do not wait to see if the pain will go  away.   You have weakness or numbness in your face, arms, or legs, especially on one side of the body.   Your speech is slurred.   You are confused.   You have a sudden severe headache or loss of vision.   You have shortness of breath.   You are sweating and have nausea.   You feel dizzy or faint.   You are fatigued.  These symptoms may represent a serious problem that is an emergency. Do not wait to see if the symptoms will go away. Get medical help right away. Call your local emergency services (911 in the [place] Do not drive yourself to the hospital.  This information is not intended to replace advice given to you by your health care provider. Make sure you discuss any questions you have with your health care provider.  Document Released: 05/08/2008 Document Revised: 07/16/2016 Document Reviewed: 06/13/2015  Elsevier Interactive Patient Education  2018 Elsevier Inc[employer]

## 2018-06-13 NOTE — Telephone Encounter (Signed)
-----   Message from Iran Ouch, MD sent at 06/13/2018  8:50 AM EDT ----- This is a patient of Dr. Mariah Milling.  TCM follow-up needed in 1 week for heart failure.  She needs to have basic metabolic profile done in mid week either Tuesday or Wednesday.

## 2018-06-13 NOTE — Telephone Encounter (Signed)
Called and s/w patient's sister, ok per DPR.  Patient has not been discharged from the hospital yet. They were on the way to pick her up now. Wendi verbalized understanding of the appointment for labs next week.  We still need to call patient for TCM.

## 2018-06-13 NOTE — Care Management Note (Addendum)
Case Management Note  Patient Details  Name: Zendayah Elenes MRN: [id number] Date of Birth: Apr 15, 1966  Subjective/Objective:    Discharging to home today with wife.  Follow up appointment on discharge summary.  No needs identified bt RNCM.     [facility] referral made.            Action/Plan:   Expected Discharge Date:  06/13/18               Expected Discharge Plan:  Home/Self Care  In-House Referral:     Discharge planning Services  CM Consult  Post Acute Care Choice:    Choice offered to:     DME Arranged:    DME Agency:     HH Arranged:    HH Agency:     Status of Service:  Completed, signed off  If discussed at [employer] of Stay Meetings, dates discussed:    Additional Comments:  Sherren Kerns, RN 06/13/2018, 10:34 AM

## 2018-06-13 NOTE — Care Management Important Message (Signed)
Copy of signed IM left with patient in room.  

## 2018-06-13 NOTE — Telephone Encounter (Signed)
I have made the 1st attempt to contact the patient or family member in charge, in order to follow up from recently being discharged from the hospital. I left a message on voicemail requesting a CB. -MM 

## 2018-06-13 NOTE — Progress Notes (Signed)
Progress Note  Patient Name: Bonnie Ochoa Date of Encounter: 06/13/2018  Primary Cardiologist: Julien Nordmann, MD   Subjective   She complains of headache.  Blood pressure continues to be low with systolic blood pressure of 85.  Renal function is slightly worse.  Inpatient Medications    Scheduled Meds: . carvedilol  6.25 mg Oral BID  . enoxaparin (LOVENOX) injection  40 mg Subcutaneous Q24H  . famotidine  40 mg Oral QPM  . Influenza vac split quadrivalent PF  0.5 mL Intramuscular Tomorrow-1000  . insulin aspart  0-9 Units Subcutaneous TID WC  . insulin aspart  3 Units Subcutaneous TID WC  . insulin glargine  18 Units Subcutaneous Q2200  . lamoTRIgine  25 mg Oral BID  . metoCLOPramide  10 mg Oral TID WC  . pantoprazole  40 mg Oral Daily  . PARoxetine  30 mg Oral Daily  . QUEtiapine  300 mg Oral QHS  . sodium chloride flush  3 mL Intravenous Q12H  . sodium chloride flush  3 mL Intravenous Q12H   Continuous Infusions: . sodium chloride     PRN Meds: sodium chloride, acetaminophen **OR** acetaminophen, cyclobenzaprine, dicyclomine, meclizine, morphine injection, nitroGLYCERIN, ondansetron **OR** ondansetron (ZOFRAN) IV, ondansetron, sodium chloride flush, traMADol   Vital Signs    Vitals:   06/12/18 1510 06/12/18 2018 06/13/18 0342 06/13/18 0817  BP: 104/75 102/74 116/83 (!) 86/75  Pulse: 85 84 85 95  Resp: 19 18 18 20   Temp: 98.5 F (36.9 C) 98.3 F (36.8 C) 98.2 F (36.8 C) 98.4 F (36.9 C)  TempSrc: Oral Oral Oral   SpO2: 95% 99% 98% 99%  Weight:   87 kg   Height:        Intake/Output Summary (Last 24 hours) at 06/13/2018 0846 Last data filed at 06/13/2018 0604 Gross per 24 hour  Intake 720 ml  Output 1400 ml  Net -680 ml   Filed Weights   06/11/18 1944 06/12/18 0542 06/13/18 0342  Weight: 86.2 kg 86.2 kg 87 kg    Telemetry    Normal sinus rhythm with PVCs.- Personally Reviewed  ECG     - Personally Reviewed  Physical Exam   GEN: No acute  distress.   Neck:  Jugular venous pressure is not well visualized. Cardiac: RRR, no murmurs, rubs, or gallops.  Respiratory: Clear to auscultation bilaterally.   GI: Soft, nontender, non-distended  MS: No edema; No deformity. Neuro:  Nonfocal  Psych: Normal affect   Labs    Chemistry Recent Labs  Lab 06/07/18 0752  06/10/18 0855 06/11/18 0600 06/12/18 0435  NA 137   < > 137 138 135  K 3.6   < > 3.7 3.8 3.7  CL 105   < > 102 102 100  CO2 22   < > 23 24 23   GLUCOSE 239*   < > 157* 180* 175*  BUN 24*   < > 18 20 27*  CREATININE 1.26*   < > 1.11* 1.16* 1.34*  CALCIUM 8.8*   < > 9.4 9.2 9.0  PROT 8.1  --   --   --   --   ALBUMIN 3.8  --   --   --   --   AST 38  --   --   --   --   ALT 125*  --   --   --   --   ALKPHOS 103  --   --   --   --  BILITOT 1.3*  --   --   --   --   GFRNONAA 47*   < > 55* 52* 44*  GFRAA 55*   < > >60 >60 51*  ANIONGAP 10   < > 12 12 12    < > = values in this interval not displayed.     Hematology Recent Labs  Lab 06/08/18 0506 06/09/18 0153 06/10/18 0603  WBC 7.0 6.8 5.4  RBC 3.07* 3.00* 3.49*  HGB 9.1* 8.8* 10.3*  HCT 30.6* 29.7* 34.3*  MCV 99.7 99.0 98.3  MCH 29.6 29.3 29.5  MCHC 29.7* 29.6* 30.0  RDW 17.2* 17.1* 16.7*  PLT 291 290 310    Cardiac Enzymes Recent Labs  Lab 06/07/18 1126 06/07/18 1632 06/07/18 2201 06/08/18 0506  TROPONINI 0.22* 0.14* 0.12* 0.11*   No results for input(s): TROPIPOC in the last 168 hours.   BNPNo results for input(s): BNP, PROBNP in the last 168 hours.   DDimer No results for input(s): DDIMER in the last 168 hours.   Radiology    No results found.  Cardiac Studies   Echo (07/03/17): - Procedure narrative: Transthoracic echocardiography. The study was technically difficult. - Left ventricle: The cavity size was normal. There was moderate concentric hypertrophy. Systolic function was severely reduced. The estimated ejection fraction was in the range of 20% to 25%. Diffuse  hypokinesis. Regional wall motion abnormalities cannot be excluded. - Left atrium: The atrium was mildly dilated. - Right ventricle: Systolic function was normal. - Pulmonary arteries: Systolic pressure could not be accurately estimated.  Patient Profile     52 y.o. female with a hx of chronic systolic heart failure due to non-ischemic cardiomyopathy, moderate mitral regurgitation, pulmonary hypertension, bipolar disorder, hepatic cirrhosis, hypertension, diabetes mellitus, obstructive sleep apnea, chronic anemia, and polysubstance abuse,admitted with chest pain and mild troponin elevation in the setting of acute on chronic systolic heart failure.  Assessment & Plan    1.  Acute on chronic systolic heart failure: The patient's blood pressure continues to be low in spite of stopping hydralazine and Imdur yesterday.  With current systolic blood pressure of 85, I do not think she will tolerate Entresto at the present time.  Thus, I stopped this medication. Continue current dose of carvedilol. The patient does not appear to be volume overloaded.  Given slight worsening of renal function, I think we can hold furosemide another day and resume at 40 mg by mouth once daily tomorrow. I think she can be discharged home today and I can arrange for her to have labs done in our office next week with close follow-up.  2.  Supply demand ischemia: Elevated troponin is due to supply demand ischemia in the setting of acute decompensated systolic heart failure.  Cardiac catheterization during this admission showed normal coronary arteries.  3.  Essential hypertension: Blood pressure is low.  I adjusted her medications as outlined above.  4.  Obstructive sleep apnea: Continue CPAP.     For questions or updates, please contact [facility] Please consult [email or web address] for contact info under        Signed, Lorine Bears, MD  06/13/2018, 8:46 AM

## 2018-06-13 NOTE — Progress Notes (Signed)
Remote ICD transmission.   

## 2018-06-13 NOTE — Progress Notes (Signed)
Discharge home today, discharge instructions on the computer, spoke with cardiology Dr. Kirke Corin.  Patient BP still low .  We will hold that she can start Lasix from tomorrow and discontinue Entresto, continue Coreg at the same dose, Dr. Kirke Corin will see her in the office on Tuesday regarding blood work.  Discharge summary to follow.  Total time spent more than 30 minutes.

## 2018-06-13 NOTE — Plan of Care (Signed)
  Problem: Education: Goal: Knowledge of General Education information will improve Description Including pain rating scale, medication(s)/side effects and non-pharmacologic comfort measures Outcome: Progressing   Problem: Health Behavior/Discharge Planning: Goal: Ability to manage health-related needs will improve Outcome: Progressing   Problem: Clinical Measurements: Goal: Ability to maintain clinical measurements within normal limits will improve Outcome: Progressing Goal: Will remain free from infection Outcome: Progressing Goal: Diagnostic test results will improve Outcome: Progressing Goal: Respiratory complications will improve Outcome: Progressing Goal: Cardiovascular complication will be avoided Outcome: Progressing   Problem: Activity: Goal: Risk for activity intolerance will decrease Outcome: Progressing   Problem: Nutrition: Goal: Adequate nutrition will be maintained Outcome: Progressing   Problem: Coping: Goal: Level of anxiety will decrease Outcome: Progressing   Problem: Elimination: Goal: Will not experience complications related to bowel motility Outcome: Progressing Goal: Will not experience complications related to urinary retention Outcome: Progressing   Problem: Pain Managment: Goal: General experience of comfort will improve Outcome: Progressing   Problem: Safety: Goal: Ability to remain free from injury will improve Outcome: Progressing   Problem: Skin Integrity: Goal: Risk for impaired skin integrity will decrease Outcome: Progressing   Problem: Education: Goal: Understanding of CV disease, CV risk reduction, and recovery process will improve Outcome: Progressing Goal: Individualized Educational Video(s) Outcome: Progressing   Problem: Activity: Goal: Ability to return to baseline activity level will improve Outcome: Progressing   Problem: Cardiovascular: Goal: Ability to achieve and maintain adequate cardiovascular perfusion  will improve Outcome: Progressing Goal: Vascular access site(s) Level 0-1 will be maintained Outcome: Progressing   Problem: Health Behavior/Discharge Planning: Goal: Ability to safely manage health-related needs after discharge will improve Outcome: Progressing   Problem: Education: Goal: Ability to demonstrate management of disease process will improve Outcome: Progressing Goal: Ability to verbalize understanding of medication therapies will improve Outcome: Progressing   Problem: Activity: Goal: Capacity to carry out activities will improve Outcome: Progressing   Problem: Cardiac: Goal: Ability to achieve and maintain adequate cardiopulmonary perfusion will improve Outcome: Progressing

## 2018-06-13 NOTE — Telephone Encounter (Signed)
TCM  Patient is now scheduled for TCM on 06/20/18 with Ward Givens  And labs on 06/17/18 just need orders placed please

## 2018-06-13 NOTE — Progress Notes (Unsigned)
Nutrition Education Note  RD consulted for nutrition education regarding CHF.  RD provided "Low Sodium Nutrition Therapy" handout from the Academy of [employer]. Reviewed patient's dietary recall. Provided examples on ways to decrease sodium intake in diet. Discouraged intake of processed foods and use of salt shaker. Encouraged fresh fruits and vegetables as well as whole grain sources of carbohydrates to maximize fiber intake.   RD discussed why it is important for patient to adhere to diet recommendations, and emphasized the role of fluids, foods to avoid, and importance of weighing self daily. Teach back method used.  Expect good compliance.  Current diet order is HH, patient is consuming approximately 100% of meals at this time. Labs and medications reviewed. No further nutrition interventions warranted at this time. RD contact information provided. If additional nutrition issues arise, please re-consult RD.   Betsey Holiday MS, RD, LDN Pager #- [phone] Office#- [phone] After Hours Pager: [phone]

## 2018-06-16 ENCOUNTER — Other Ambulatory Visit: Payer: Self-pay | Admitting: *Deleted

## 2018-06-16 ENCOUNTER — Encounter: Payer: Self-pay | Admitting: *Deleted

## 2018-06-16 NOTE — Telephone Encounter (Signed)
Patient contacted regarding discharge from [facility] on 06/13/18.   Patient understands to follow up with provider ? On 06/20/18 at 10am at [place].  Patient understands discharge instructions? Yes  Patient understands medications and regiment? Yes  Patient understands to bring all medications to this visit? Yes   She is also aware to come to the office tomorrow morning for lab work. She was very Adult nurse.

## 2018-06-16 NOTE — Patient Outreach (Signed)
[employer] Customer service manager [facility]) Care Management [facility] Community CM Telephone Outreach  PCP completes Transition of Care follow up post-hospital discharge Post-hospital discharge day # 3  06/16/2018  Bonnie Ochoa 01/24/1966 [id number]  Successful telephone outreach to [facility], 52 year old female referred to [facility] Community CM by inpatient RN CM after recent hospitalization October 26-June 13, 2018 for acute on chronic CHF exacerbation.  Patient was discharged home to self care without home health services in place.  Patient has history including, but not limited to, CHF/ cardiomyopathy; previous NSTEMI in 2016 with ICD placement; OSA; HTN; DM; Bipolar disorder; cirrhosis.  HIPAA/ identity verified with patient during phone call today, and [facility] Community CM services were discussed with patient; patient provides verbal consent for [facility] Community RN CM involvement in her care post-recent hospital discharge.  Patient also provides verbal permission to speak with her spouse, Bonnie Ochoa [phone]) at "any time" as indicated.  Today, patient reports that she is "doing really good," after her recent hospitalization.  She denies concerns, pain, new/ recent falls post-hospital discharge and she sounds to be in no obvious distress throughout today's 45 minute phone call.  Patient further reports:  Medications: -- Has all medicationsand takes as prescribed;denies questions about current medications.  -- Verbalizes good general understanding of the purpose, dosing, and scheduling of medications.   -- family manages medications for patient using weekly pill planner box- once pill box is filled, patient takes out of pill box independently -- denies issues with swallowing medications -- patient was recently discharged from the hospital and all medications were thoroughly reviewed with patient today during phone call  Provider appointments: -- All upcoming provider appointments were reviewed with  patient today and she verbalizes an accurate understanding of all scheduled provider appointments: June 17, 2018- lab appointment June 18, 2018- heart failure office visit June 19, 2018- scheduled/ planned colonoscopy as outpatient June 20, 2018- cardiology provider office visit June 23, 2018- PCP office visit  Safety/ Mobility/ Falls: -- denies new/ recent falls; reports one fall in 2019 where she "got dizzy and passed out" while on the toilet -- assistive devices: has cane, but does not need to use; "walking fine" post-hospital discharge  -- general fall risks/ prevention education discussed with patient today  Social/ Community Resource needs: -- currently denies community resource needs, stating supportive family members that assist with care needs as indicated: patient lives with her spouse and her sister, whom she reports is a Engineer, civil (consulting) -- family provides transportation for patient to all provider appointments, errands, etc  Advanced Directive (AD) Planning:   --reports does not currently have exisisting AD in place.  Discussed basics of AD planning with patient, and she is agreeable to my placing educational material in the mail to her for her review.  Self-health management of CHF: -- states has just recently started monitoring and recording daily weights; patient unsure around rationale for same, and education around importance/ role of daily weight monitoring/ recording at home was thoroughly discussed with patient today. -- discussed weight gain guidelines in setting of CHF with patient, to promptly report weight gain > 3 lbs overnight/ 5 lbs in one week to cardiology provider -- basic pathophysiology of fluid overload in setting of CHF discussed with patient; we also reviewed signs/ symptoms of CHF that would require prompt reporting/ seeking emergent care: shortness of breath/ difficulty breathing unrelieved by rest/ medications.  We briefly reviewed signs/ symptoms  yellow CHF zone along with corresponding action plan -- patient uses  CPAP for OSA -- patient has ICD from previous MI in 2016  Patient denies further issues, concerns, or problems today.  I provided/ confirmed that patient has my direct phone number, the main [facility] CM office phone number, and the [facility] CM 24-hour nurse advice phone number should issues arise prior to next scheduled [facility] Community CM outreach with scheduled phone call next week post-scheduled provider visits.  Encouraged patient to contact me directly if needs, questions, issues, or concerns arise prior to next scheduled outreach; patient agreed to do so.  Plan:  Patient will take medications as prescribed and will attend all scheduled provider appointments  Patient will promptly notify care providers for any new concerns/ issues/ problems that arise  Patient will continue monitoring/ recording daily weights   I will make patien't PCP aware of [facility] Community RN CM involvement in patient's care-- will send barriers letter  [facility] Community CM outreach to continue with scheduled phone call next week  [facility] CM Care Plan Problem One     Most Recent Value  Care Plan Problem One  High risk for hospital readmission secondary to/ as evidenced by recent hospitalization for CHF exacerbation  Role Documenting the Problem One  Care Management Coordinator  Care Plan for Problem One  Active  [facility] Long Term Goal   Over the next 31 days, patient will not experience hospital readmission, as evidenced by patient reporting and review of EMR during [name] Community RN CM outreach  [facility] Long Term Goal Start Date  06/16/18  Interventions for Problem One Long Term Goal  Discussed with patient her current clinical condition post-hospital discharge on 06/13/18,  reviewed current list of medications with patient and confirmed that patient does not have medication concerns and is taking medications as prescribed post-hospital discharge,  discussed [facility] Community CM  services with patient and initiated [facility] CCM program post-recent hospital discharge  [facility] CM Short Term Goal #1   Over the next 30 days, patient will attend all scheduled provider visits, as evidenced by patient reporting and review of EMR/ collaboration with care providers as indicated during [facility] RN CCM outreach  [facility] CM Short Term Goal #1 Start Date  06/16/18  Interventions for Short Term Goal #1  Reviewed all upcoming provider appointments with patient and confirmed that she has reliable transportation for all scheduled appointments  [facility] CM Short Term Goal #2   Over the next 30 days, patient will continue monitoring and recording daily weights at home, as evidenced by review of same with patient during [facility] RN CCM outreach  [facility] CM Short Term Goal #2 Start Date  06/16/18  Interventions for Short Term Goal #2  Discussed with patient role of daily weight monitoring/ recording in setting of CHF,  discussed weight gain guidelines in setting of CHF to report weight gain > 3 lbs overnight/ 5 lbs in one week to cardiology providers,  discussed rationale and pathophysiology of fluid management for chronic disease management of CHF       I appreciate the opportunity to participate in Elsey's care,  Caryl Pina, RN, BSN, [employer] Coordinator [facility] Care Management  [phone]

## 2018-06-17 ENCOUNTER — Other Ambulatory Visit (INDEPENDENT_AMBULATORY_CARE_PROVIDER_SITE_OTHER): Payer: Medicare Other | Admitting: *Deleted

## 2018-06-17 DIAGNOSIS — I10 Essential (primary) hypertension: Secondary | ICD-10-CM

## 2018-06-17 DIAGNOSIS — I5023 Acute on chronic systolic (congestive) heart failure: Secondary | ICD-10-CM

## 2018-06-17 NOTE — Discharge Summary (Signed)
Bonnie Ochoa, is a 52 y.o. female  DOB 04-11-1966  MRN [id number].  Admission date:  06/07/2018  Admitting Physician  Fritzi Mandes, MD  Discharge Date:  06/13/2018   Primary MD  Valerie Roys, DO  Recommendations for primary care physician for things to follow:  Follow-up with PCP Follow-up with Dr. Fletcher Anon in 3 to 4 days regarding renal function.    Admission Diagnosis  NSTEMI (non-ST elevated myocardial infarction) ([place]) [I21.4] Chest pain, unspecified type [R07.9]   Discharge Diagnosis  NSTEMI (non-ST elevated myocardial infarction) ([place]) [I21.4] Chest pain, unspecified type [R07.9]    Active Problems:   Acute on chronic systolic (congestive) heart failure (HCC)   Unstable angina (HCC)   NSTEMI (non-ST elevated myocardial infarction) [facility])      Past Medical History:  Diagnosis Date  . Abdominal pain    a. 05/2018 HIDA scan wnl.  . ADHD   . Arthritis   . Asthma   . Bipolar 1 disorder ([place])   . Chest pain    a. Hx of cath in [place] - reportedly nl; b. 04/2018 MV: EF 22%, fixed dist ant septal, apical, and inferoapical defects - ? scar vs. attenuation. No ischemia.  . Cirrhosis of liver ([place])   . Depression   . Diabetes mellitus without complication ([place])   . Diverticulitis   . HFrEF (heart failure with reduced ejection fraction) ([place])    a. 06/2017 Echo: EF 20-25%, diff HK. Mildly dil LA.  Marland Kitchen Hypertension   . IBS (irritable bowel syndrome)   . Insomnia   . Migraines   . NICM (nonischemic cardiomyopathy) ([place])    a. EF prev 25%; b. 11/2014 s/p SJM Fortify [place], single lead AICD (ser# [id number]); c. 06/2017 Echo: EF 20-25%.  . OSA (obstructive sleep apnea)   . PTSD (post-traumatic stress disorder)   . Restless leg syndrome   . Sleep apnea   . Vertigo     Past Surgical History:  Procedure Laterality Date   . ABDOMINAL HYSTERECTOMY    . debribalator  2016  . INSERT / REPLACE / REMOVE PACEMAKER    . INSERTION OF ICD    . RIGHT/LEFT HEART CATH AND CORONARY ANGIOGRAPHY N/A 06/09/2018   Procedure: RIGHT/LEFT HEART CATH AND CORONARY ANGIOGRAPHY;  Surgeon: Wellington Hampshire, MD;  Location: [facility];  Service: Cardiovascular;  Laterality: N/A;  . TONSILECTOMY, ADENOIDECTOMY, BILATERAL MYRINGOTOMY AND TUBES    . TONSILLECTOMY    . TONSILLECTOMY    . TUBAL LIGATION  1980  . TUBAL LIGATION         History of present illness and  Hospital Course:     Kindly see H&P for history of present illness and admission details, please review complete Labs, Consult reports and Test reports for all details in brief  HPI  from the history and physical done on the day of admission] 52 year old female with history of systolic heart failure comes in with worsening swelling, shortness of breath, found to have CHF.  Hospital Course  Acute on chronic systolic heart failure: Improved with IV Lasix, seen by cardiology with Dr. Fletcher Anon, patient received her home dose Coreg, Entresto but patient blood pressure was low during hospitalization and then discontinued Lasix, stopped Entresto, patient hypotension improved, seen by cardiology, advised to restart Coreg, Lasix, discharged home with them and patient Delene Loll not started at discharge time, patient to see Dr. Fletcher Anon in few days to restart Entresto based on her renal function, blood pressure. 2.  History of anxiety, depression: Continue home meds 3.  Diabetes mellitus type 2, patient is on Lantus.     Discharge Condition: Stable   Follow UP  Follow-up Information    Park Liter P, DO. Schedule an appointment as soon as possible for a visit on 06/24/2018.   Specialty:  Family Medicine Why:  Appointment Time: @ 2:30pm Contact information: [place] [address] [phone]        Theora Gianotti, NP. Schedule an appointment  as soon as possible for a visit on 06/20/2018.   Specialties:  Nurse Practitioner, Cardiology, Radiology Why:  Appointment Time: @ 10:00 Contact information: [address] [place] [address] [place] Follow up on 06/17/2018.   Why:   Appointment at [facility] work [address] [place] [address] Appointment Time; 9:30 for [employer] information: [place] [address] [phone]       [place] On 06/18/2018.   Specialty:  Cardiology Why:  @ 1:40 p.m.   Follow-up in the [facility] with Deanna Artis, FNP.   Contact information: [place] [phone]            Discharge Instructions  and  Discharge Medications     Discharge Instructions    Amb Referral to Cardiac Rehabilitation   Complete by:  As directed    Diagnosis:  Heart Failure (see criteria below if ordering Phase II)   Heart Failure Type:  Chronic Systolic Comment - EF 25 - 30%.     Discharge instructions   Complete by:  As directed    Resume lasix from tomorrow No entresto until seen by cardiology     Allergies as of 06/13/2018      Reactions   Levothyroxine Rash      Medication List    TAKE these medications   blood glucose meter kit and supplies Kit Dispense based on patient and insurance preference. Use up to four times daily as directed. (FOR ICD-9 250.00, 250.01).   carvedilol 12.5 MG tablet Commonly known as:  COREG Take 1 tablet (12.5 mg total) by mouth 2 (two) times daily.   cyclobenzaprine 5 MG tablet Commonly known as:  FLEXERIL Take 1 tablet (5 mg total) by mouth 3 (three) times daily as needed for muscle spasms.   dicyclomine 20 MG tablet Commonly known as:  BENTYL Take 1 tablet  (20 mg total) by mouth 3 (three) times daily as needed for spasms.   famotidine 40 MG tablet Commonly known as:  PEPCID Take 1 tablet (40 mg total) by mouth every evening.   furosemide 40 MG tablet Commonly known as:  LASIX Take 1 tablet (40 mg total) by mouth daily. What changed:    medication strength  how much to take  how to take this  when to take this  additional instructions   Insulin Glargine 100 UNIT/ML Solostar Pen Commonly known as:  LANTUS Inject 20 Units into the skin daily at 10 pm.   Insulin Pen Needle 32G X 6 MM Misc 1 each by Does not apply route daily.   lamoTRIgine 25 MG tablet Commonly known as:  LAMICTAL Take 1 tablet (25 mg total) by mouth 2 (two) times daily.   metoCLOPramide 10 MG  tablet Commonly known as:  REGLAN Take 1 tablet (10 mg total) by mouth 3 (three) times daily with meals.   nitroGLYCERIN 0.4 MG SL tablet Commonly known as:  NITROSTAT Place 1 tablet (0.4 mg total) under the tongue every 5 (five) minutes as needed for chest pain.   ondansetron 4 MG disintegrating tablet Commonly known as:  ZOFRAN-ODT Take 1 tablet (4 mg total) by mouth every 8 (eight) hours as needed for nausea or vomiting.   pantoprazole 40 MG tablet Commonly known as:  PROTONIX Take 1 tablet (40 mg total) by mouth daily.   PARoxetine 30 MG tablet Commonly known as:  PAXIL TAKE 1 TABLET BY MOUTH EVERY DAY   QUEtiapine 300 MG tablet Commonly known as:  SEROQUEL TAKE 1 TABLET BY MOUTH EVERYDAY AT BEDTIME What changed:  See the new instructions.   sucralfate 1 g tablet Commonly known as:  CARAFATE Take 1 tablet (1 g total) by mouth 4 (four) times daily.   traMADol 50 MG tablet Commonly known as:  ULTRAM Take 1 tablet (50 mg total) by mouth every 8 (eight) hours as needed for moderate pain.         Diet and Activity recommendation: See Discharge Instructions above   Consults obtained -cardiology   Major procedures and Radiology Reports - PLEASE  review detailed and final reports for all details, in brief -      Dg Chest 2 View  Result Date: 06/07/2018 CLINICAL DATA:  Chest pain.  Shortness of breath. EXAM: CHEST - 2 VIEW COMPARISON:  May 31, 2018 FINDINGS: Stable cardiomegaly. Minimal interstitial prominence without overt edema. Mild atelectasis in the left base. Stable AICD device. No other interval change. IMPRESSION: Cardiomegaly and probable mild pulmonary venous congestion. Electronically Signed   By: Dorise Bullion III M.D   On: 06/07/2018 09:00   Dg Chest 2 View  Result Date: 05/31/2018 CLINICAL DATA:  Right side abdominal pain radiating into the chest. EXAM: CHEST - 2 VIEW COMPARISON:  Single-view of the chest 05/25/2018. PA and lateral chest 05/19/2018 and 07/02/2017. FINDINGS: AICD is in place. Tip of the lead is in the right ventricle. Cardiomegaly without edema is seen. Small focus of linear atelectasis is noted in the lingula. No pneumothorax or pleural effusion. IMPRESSION: Cardiomegaly without pulmonary edema. Linear subsegmental atelectasis in the lingula. Electronically Signed   By: Inge Rise M.D.   On: 05/31/2018 08:39   Dg Chest 2 View  Result Date: 05/19/2018 CLINICAL DATA:  Chest pain EXAM: CHEST - 2 VIEW COMPARISON:  05/08/2018 FINDINGS: Left AICD remains in place, unchanged. Heart is borderline in size. Lingular scarring. No confluent opacity on the right. No effusions or acute bony abnormality. IMPRESSION: Lingular scarring.  No active disease. Electronically Signed   By: Rolm Baptise M.D.   On: 05/19/2018 12:18   Nm Hepatobiliary Liver Func  Result Date: 05/26/2018 CLINICAL DATA:  52 year old female with right upper quadrant pain. Subsequent encounter. EXAM: NUCLEAR MEDICINE HEPATOBILIARY IMAGING TECHNIQUE: Sequential images of the abdomen were obtained out to 60 minutes following intravenous administration of radiopharmaceutical. RADIOPHARMACEUTICALS:  7.454 mCi Tc-29m Choletec IV COMPARISON:   05/25/2018 ultrasound. FINDINGS: Prompt uptake and biliary excretion of activity by the liver is seen. Gallbladder activity is visualized, consistent with patency of cystic duct. Biliary activity passes into small bowel, consistent with patent common bile duct. IMPRESSION: 1. Gallbladder activity is visualized, consistent with patency of cystic duct. 2. Biliary activity passes into small bowel, consistent with patent common bile duct.  Electronically Signed   By: Genia Del M.D.   On: 05/26/2018 12:59   Ct Abdomen Pelvis W Contrast  Result Date: 05/28/2018 CLINICAL DATA:  Cirrhosis of the liver, heart failure some abdominal discomfort primarily right-sided, nausea EXAM: CT ABDOMEN AND PELVIS WITH CONTRAST TECHNIQUE: Multidetector CT imaging of the abdomen and pelvis was performed using the standard protocol following bolus administration of intravenous contrast. CONTRAST:  143m ISOVUE-300 IOPAMIDOL (ISOVUE-300) INJECTION 61% COMPARISON:  CT abdomen pelvis of 02/09/2017 FINDINGS: Lower chest: There is linear atelectasis or scarring in the anterior left lower lobe. No pneumonia or effusion is evident. There is moderate cardiomegaly noted. No pericardial effusion is seen. Hepatobiliary: The liver is somewhat inhomogeneous with slightly nodular contours consistent with diagnosis of cirrhosis. No focal hepatic abnormality is seen. However, the gallbladder appears to have a somewhat thickened wall. No definite calcified gallstones are seen but either ultrasound of the right upper quadrant or nuclear medicine hepatobiliary scan would be recommended to evaluate for possible cholecystitis. Prominent gallbladder wall could also be seen in hypoproteinemia. Pancreas: The head of the pancreas is very indistinct and there is some peripancreatic fluid extending into the anterior pararenal space. Acute pancreatitis of the pancreatic head is a definite consideration. The body and tail of the pancreas is unremarkable and the  pancreatic duct is not dilated. Correlate with appropriate laboratory values. Spleen: The spleen is unremarkable.  A splenule is noted medially Adrenals/Urinary Tract: The adrenal glands appear normal. The kidneys enhance with no calculus or mass. On delayed images, the pelvocaliceal systems are unremarkable. The ureters appear normal caliber. The urinary bladder is not well distended but no abnormality is seen. Stomach/Bowel: The stomach is moderately distended with food debris and oral contrast media with no definite abnormality. There is some indistinctness of the descending duodenum which most likely is related to possible inflammatory process as described above involving the head of the pancreas. Stepped contrast is scattered throughout small bowel which is nondistended. There are a few rectosigmoid diverticula present. There is a moderate amount of feces throughout the colon. Terminal ileum and the appendix are unremarkable. Vascular/Lymphatic: The abdominal aorta is normal in caliber with mild to moderate abdominal aortic atherosclerosis present. Only a few small retroperitoneal nodes are present. Reproductive: The uterus has previously been resected. No adnexal lesion is seen. No fluid is noted within the pelvis. Other: No abdominal wall hernia is seen. Musculoskeletal: The lumbar vertebrae are in normal alignment. Degenerative disc disease is present primarily at L4-5. IMPRESSION: 1. Indistinctness around the head of the pancreas with some fluid in the anterior pararenal space may indicate acute pancreatitis involving the head of the pancreas. Correlate with appropriate laboratory values. 2. Changes of hepatic cirrhosis.  No focal hepatic abnormality. 3. Somewhat thickened gallbladder wall most likely due to hypoproteinemia but developing acute cholecystitis cannot be excluded. Correlate clinically. 4. Moderate abdominal aortic atherosclerosis. Electronically Signed   By: PIvar DrapeM.D.   On: 05/28/2018  13:56   Dg Chest Port 1 View  Result Date: 05/25/2018 CLINICAL DATA:  Shortness of breath. Dyspnea. Asthma. Congestive heart failure. Cirrhosis. EXAM: PORTABLE CHEST 1 VIEW COMPARISON:  05/19/2018 FINDINGS: Stable mild cardiomegaly. Pacemaker remains in appropriate position. Both lungs are well aerated and clear. IMPRESSION: No active disease. Electronically Signed   By: JEarle GellM.D.   On: 05/25/2018 21:19   UKoreaAbdomen Limited Ruq  Result Date: 05/25/2018 CLINICAL DATA:  Acute onset of upper abdominal pain today. EXAM: ULTRASOUND ABDOMEN LIMITED RIGHT UPPER  QUADRANT COMPARISON:  Right upper quadrant ultrasound 02/14/2018. CT abdomen and pelvis 02/09/2018 FINDINGS: Gallbladder: No gallstones identified. Gallbladder wall thickening with pericholecystic edema. Gallbladder wall measures up to about 8 mm in thickness. Murphy's sign is negative although patient was given morphine before the scan limiting the sensitivity of this sign. Common bile duct: Diameter: 4.5 mm, normal Liver: No focal lesion identified. Within normal limits in parenchymal echogenicity. Portal vein is patent on color Doppler imaging with normal direction of blood flow towards the liver. IMPRESSION: Gallbladder wall thickening and edema. No stones. Changes may be due to acalculous cholecystitis, liver disease, or other inflammatory hepatobiliary process. Could consider radionuclide biliary scan for further evaluation. Electronically Signed   By: Lucienne Capers M.D.   On: 05/25/2018 21:45    Micro Results     No results found for this or any previous visit (from the past 240 hour(s)).     Today   Subjective:   Ziyonna Christner today has no headache,no chest abdominal pain,no new weakness tingling or numbness, feels much better wants to go home today.   Objective:   Blood pressure 112/81, pulse 91, temperature 98.4 F (36.9 C), resp. rate 20, height _0  (1.626 m), weight 87 kg, SpO2 100 %.  No intake or output data  in the 24 hours ending 06/17/18 1027  Exam Awake Alert, Oriented x 3, No new F.N deficits, Normal affect [place].AT,PERRAL Supple Neck,No JVD, No cervical lymphadenopathy appriciated.  Symmetrical Chest wall movement, Good air movement bilaterally, CTAB RRR,No Gallops,Rubs or new Murmurs, No Parasternal Heave +ve B.Sounds, Abd Soft, Non tender, No organomegaly appriciated, No rebound -guarding or rigidity. No Cyanosis, Clubbing or edema, No new Rash or bruise  Data Review   CBC w Diff:  Lab Results  Component Value Date   WBC 5.4 06/10/2018   HGB 10.3 (L) 06/10/2018   HGB 12.5 02/19/2018   HCT 34.3 (L) 06/10/2018   HCT 38.3 02/19/2018   PLT 310 06/10/2018   PLT 267 02/19/2018   LYMPHOPCT 34 05/27/2018   MONOPCT 7 05/27/2018   EOSPCT 5 05/27/2018   BASOPCT 1 05/27/2018    CMP:  Lab Results  Component Value Date   NA 135 06/12/2018   NA 142 06/03/2018   K 3.7 06/12/2018   CL 100 06/12/2018   CO2 23 06/12/2018   BUN 27 (H) 06/12/2018   BUN 9 06/03/2018   CREATININE 1.34 (H) 06/12/2018   PROT 8.1 06/07/2018   PROT 7.9 06/03/2018   ALBUMIN 3.8 06/07/2018   ALBUMIN 4.3 06/03/2018   BILITOT 1.3 (H) 06/07/2018   BILITOT 1.0 06/03/2018   ALKPHOS 103 06/07/2018   AST 38 06/07/2018   ALT 125 (H) 06/07/2018  .   Total Time in preparing paper work, data evaluation and todays exam - 35 minutes  Epifanio Lesches M.D on 06/13/2018 at 10:27 AM    Note: This dictation was prepared with Dragon dictation along with smaller phrase technology. Any transcriptional errors that result from this process are unintentional.

## 2018-06-18 ENCOUNTER — Ambulatory Visit: Payer: Medicare Other | Attending: Family | Admitting: Family

## 2018-06-18 ENCOUNTER — Ambulatory Visit: Payer: Medicare Other | Admitting: Family

## 2018-06-18 ENCOUNTER — Other Ambulatory Visit: Payer: Self-pay | Admitting: Cardiovascular Disease

## 2018-06-18 ENCOUNTER — Encounter: Payer: Self-pay | Admitting: Family

## 2018-06-18 VITALS — BP 120/86 | HR 90 | Resp 18 | Ht 64.0 in | Wt 198.2 lb

## 2018-06-18 DIAGNOSIS — G2581 Restless legs syndrome: Secondary | ICD-10-CM | POA: Insufficient documentation

## 2018-06-18 DIAGNOSIS — K7460 Unspecified cirrhosis of liver: Secondary | ICD-10-CM | POA: Insufficient documentation

## 2018-06-18 DIAGNOSIS — I110 Hypertensive heart disease with heart failure: Secondary | ICD-10-CM | POA: Diagnosis not present

## 2018-06-18 DIAGNOSIS — Z8249 Family history of ischemic heart disease and other diseases of the circulatory system: Secondary | ICD-10-CM | POA: Diagnosis not present

## 2018-06-18 DIAGNOSIS — Z95810 Presence of automatic (implantable) cardiac defibrillator: Secondary | ICD-10-CM | POA: Diagnosis not present

## 2018-06-18 DIAGNOSIS — I5022 Chronic systolic (congestive) heart failure: Secondary | ICD-10-CM

## 2018-06-18 DIAGNOSIS — G4700 Insomnia, unspecified: Secondary | ICD-10-CM | POA: Insufficient documentation

## 2018-06-18 DIAGNOSIS — F4310 Post-traumatic stress disorder, unspecified: Secondary | ICD-10-CM | POA: Insufficient documentation

## 2018-06-18 DIAGNOSIS — G43909 Migraine, unspecified, not intractable, without status migrainosus: Secondary | ICD-10-CM | POA: Insufficient documentation

## 2018-06-18 DIAGNOSIS — M1990 Unspecified osteoarthritis, unspecified site: Secondary | ICD-10-CM | POA: Insufficient documentation

## 2018-06-18 DIAGNOSIS — Z79899 Other long term (current) drug therapy: Secondary | ICD-10-CM | POA: Insufficient documentation

## 2018-06-18 DIAGNOSIS — I509 Heart failure, unspecified: Secondary | ICD-10-CM | POA: Insufficient documentation

## 2018-06-18 DIAGNOSIS — E119 Type 2 diabetes mellitus without complications: Secondary | ICD-10-CM | POA: Diagnosis not present

## 2018-06-18 DIAGNOSIS — F319 Bipolar disorder, unspecified: Secondary | ICD-10-CM | POA: Diagnosis not present

## 2018-06-18 DIAGNOSIS — K589 Irritable bowel syndrome without diarrhea: Secondary | ICD-10-CM | POA: Diagnosis not present

## 2018-06-18 DIAGNOSIS — Z87891 Personal history of nicotine dependence: Secondary | ICD-10-CM | POA: Diagnosis not present

## 2018-06-18 DIAGNOSIS — G4733 Obstructive sleep apnea (adult) (pediatric): Secondary | ICD-10-CM | POA: Insufficient documentation

## 2018-06-18 DIAGNOSIS — F909 Attention-deficit hyperactivity disorder, unspecified type: Secondary | ICD-10-CM | POA: Diagnosis not present

## 2018-06-18 DIAGNOSIS — I2510 Atherosclerotic heart disease of native coronary artery without angina pectoris: Secondary | ICD-10-CM | POA: Insufficient documentation

## 2018-06-18 DIAGNOSIS — Z794 Long term (current) use of insulin: Secondary | ICD-10-CM | POA: Diagnosis not present

## 2018-06-18 DIAGNOSIS — F1290 Cannabis use, unspecified, uncomplicated: Secondary | ICD-10-CM | POA: Diagnosis not present

## 2018-06-18 DIAGNOSIS — J45909 Unspecified asthma, uncomplicated: Secondary | ICD-10-CM | POA: Insufficient documentation

## 2018-06-18 DIAGNOSIS — I10 Essential (primary) hypertension: Secondary | ICD-10-CM

## 2018-06-18 LAB — BASIC METABOLIC PANEL
BUN/Creatinine Ratio: 15 (ref 9–23)
BUN: 17 mg/dL (ref 6–24)
CALCIUM: 9.3 mg/dL (ref 8.7–10.2)
CHLORIDE: 99 mmol/L (ref 96–106)
CO2: 20 mmol/L (ref 20–29)
CREATININE: 1.14 mg/dL — AB (ref 0.57–1.00)
GFR calc Af Amer: 63 mL/min/{1.73_m2} (ref >59)
GFR calc non Af Amer: 55 mL/min/{1.73_m2} — ABNORMAL LOW (ref >59)
GLUCOSE: 204 mg/dL — AB (ref 65–99)
Potassium: 4.1 mmol/L (ref 3.5–5.2)
Sodium: 136 mmol/L (ref 134–144)

## 2018-06-18 NOTE — Progress Notes (Signed)
Patient ID: Bonnie Ochoa, female    DOB: February 14, 1966, 52 y.o.   MRN: [id number]  HPI  Bonnie Ochoa is a 52 y/o female with a history of obstructive sleep apnea, restless leg syndrome, PTSD, HTN, DM, depression, CAD, cirrhosis, bipolar, asthma, marijuana use and chronic heart failure.   Echo report from 07/03/17 reviewed and showed an EF of 20-25%. Echo was done 05/28/16 and showed an EF of 20-25% along with moderate MR/TR and severely elevated PA pressure of 64 mm Hg.    Has been admitted twice and to the ED once during October. Admitted twice during the month of September.  Was in the ED 02/14/18 due to gastritis. Abdominal ultrasound negative for stones. Released same day. Was in the ED 02/09/18 due to abdominal pain. Abdominal/pelvic CT were negative. Released same day.   She presents today for her follow-up visit with a chief complaint of minimal fatigue upon moderate exertion. She describes this as chronic in nature having been present for several years. She does feel like her energy level is improving. She has associated cough, rare chest pain and palpitations along with this. She denies any difficulty sleeping, abdominal distention, pedal edema, shortness of breath, dizziness or weight gain. Overall, feels much better than she had been feeling.   Past Medical History:  Diagnosis Date  . Abdominal pain    a. 05/2018 HIDA scan wnl.  . ADHD   . Arthritis   . Asthma   . Bipolar 1 disorder ([place])   . Chest pain    a. Hx of cath in [place] - reportedly nl; b. 04/2018 MV: EF 22%, fixed dist ant septal, apical, and inferoapical defects - ? scar vs. attenuation. No ischemia.  . Cirrhosis of liver ([place])   . Depression   . Diabetes mellitus without complication ([place])   . Diverticulitis   . HFrEF (heart failure with reduced ejection fraction) ([place])    a. 06/2017 Echo: EF 20-25%, diff HK. Mildly dil LA.  Marland Kitchen Hypertension   . IBS (irritable bowel syndrome)   . Insomnia   . Migraines   . NICM (nonischemic  cardiomyopathy) ([place])    a. EF prev 25%; b. 11/2014 s/p SJM Fortify Assura, single lead AICD (ser# [id number]); c. 06/2017 Echo: EF 20-25%.  . OSA (obstructive sleep apnea)   . PTSD (post-traumatic stress disorder)   . Restless leg syndrome   . Sleep apnea   . Vertigo    Past Surgical History:  Procedure Laterality Date  . ABDOMINAL HYSTERECTOMY    . debribalator  2016  . INSERT / REPLACE / REMOVE PACEMAKER    . INSERTION OF ICD    . RIGHT/LEFT HEART CATH AND CORONARY ANGIOGRAPHY N/A 06/09/2018   Procedure: RIGHT/LEFT HEART CATH AND CORONARY ANGIOGRAPHY;  Surgeon: Wellington Hampshire, MD;  Location: [facility];  Service: Cardiovascular;  Laterality: N/A;  . TONSILECTOMY, ADENOIDECTOMY, BILATERAL MYRINGOTOMY AND TUBES    . TONSILLECTOMY    . TONSILLECTOMY    . TUBAL LIGATION  1980  . TUBAL LIGATION     Family History  Problem Relation Age of Onset  . Hypertension Mother   . Hyperlipidemia Mother   . Heart disease Mother   . Hypertension Sister   . Asthma Sister   . Heart disease Sister   . Diabetes Sister   . Cancer Sister   . Alzheimer's disease Maternal Grandfather   . Hyperlipidemia Brother   . Asthma Sister   . Hypertension Sister   . Diabetes  Sister    Social History   Tobacco Use  . Smoking status: Former Smoker    Types: Cigarettes  . Smokeless tobacco: Never Used  . Tobacco comment: quit about 20 years ago   Substance Use Topics  . Alcohol use: Not Currently   Allergies  Allergen Reactions  . Levothyroxine Rash   Prior to Admission medications   Medication Sig Start Date End Date Taking? Authorizing Provider  blood glucose meter kit and supplies KIT Dispense based on patient and insurance preference. Use up to four times daily as directed. (FOR ICD-9 250.00, 250.01). 07/24/17  Yes Johnson, Megan P, DO  carvedilol (COREG) 12.5 MG tablet Take 1 tablet (12.5 mg total) by mouth 2 (two) times daily. 06/03/18 09/01/18 Yes Theora Gianotti, NP   famotidine (PEPCID) 40 MG tablet Take 1 tablet (40 mg total) by mouth every evening. 04/25/18 04/25/19 Yes Johnson, Megan P, DO  furosemide (LASIX) 40 MG tablet Take 1 tablet (40 mg total) by mouth daily. 06/14/18  Yes Wellington Hampshire, MD  Insulin Glargine (LANTUS SOLOSTAR) 100 UNIT/ML Solostar Pen Inject 20 Units into the skin daily at 10 pm. 04/27/18  Yes Mody, Sital, MD  Insulin Pen Needle 32G X 6 MM MISC 1 each by Does not apply route daily. 04/07/18  Yes Johnson, Megan P, DO  lamoTRIgine (LAMICTAL) 25 MG tablet Take 1 tablet (25 mg total) by mouth 2 (two) times daily. 03/25/18  Yes [name], MD  metoCLOPramide (REGLAN) 10 MG tablet Take 1 tablet (10 mg total) by mouth 3 (three) times daily with meals. 05/01/18 05/01/19 Yes Johnson, Megan P, DO  nitroGLYCERIN (NITROSTAT) 0.4 MG SL tablet Place 1 tablet (0.4 mg total) under the tongue every 5 (five) minutes as needed for chest pain. 05/19/18 05/19/19 Yes [name], MD  ondansetron (ZOFRAN ODT) 4 MG disintegrating tablet Take 1 tablet (4 mg total) by mouth every 8 (eight) hours as needed for nausea or vomiting. 02/14/18  Yes Nance Pear, MD  pantoprazole (PROTONIX) 40 MG tablet Take 1 tablet (40 mg total) by mouth daily. 08/28/17 08/28/18 Yes [name], MD  PARoxetine (PAXIL) 30 MG tablet TAKE 1 TABLET BY MOUTH EVERY DAY 03/14/18  Yes Volney American, PA-C  QUEtiapine (SEROQUEL) 300 MG tablet TAKE 1 TABLET BY MOUTH EVERYDAY AT BEDTIME Patient taking differently: Take 300 mg by mouth at bedtime.  03/14/18  Yes Volney American, PA-C  sucralfate (CARAFATE) 1 g tablet Take 1 tablet (1 g total) by mouth 4 (four) times daily. 05/01/18  Yes Johnson, Megan P, DO  traMADol (ULTRAM) 50 MG tablet Take 1 tablet (50 mg total) by mouth every 8 (eight) hours as needed for moderate pain. 06/01/18  Yes Sainani, Belia Heman, MD  dicyclomine (BENTYL) 20 MG tablet Take 1 tablet (20 mg total) by mouth 3 (three) times daily as needed for spasms. Patient  not taking: Reported on 06/18/2018 05/08/18   Valerie Roys, DO    Review of Systems  Constitutional: Positive for fatigue ("better"). Negative for appetite change.  HENT: Positive for congestion. Negative for ear pain, nosebleeds and sore throat.   Eyes: Negative.   Respiratory: Positive for cough. Negative for chest tightness and shortness of breath.   Cardiovascular: Positive for chest pain (intermittently) and palpitations. Negative for leg swelling.  Gastrointestinal: Negative for abdominal distention and abdominal pain.  Endocrine: Negative.   Genitourinary: Negative.   Musculoskeletal: Positive for back pain. Negative for arthralgias and neck pain.  Skin: Negative.  Allergic/Immunologic: Negative.   Neurological: Negative for dizziness, light-headedness and headaches.  Hematological: Negative for adenopathy. Does not bruise/bleed easily.  Psychiatric/Behavioral: Negative for dysphoric mood, sleep disturbance (sleeping on 2 pillows) and suicidal ideas. The patient is not nervous/anxious.    Vitals:   06/18/18 1122  BP: 120/86  Pulse: 90  Resp: 18  SpO2: 100%  Weight: 198 lb 4 oz (89.9 kg)  Height: 5' 4"  (1.626 m)   Wt Readings from Last 3 Encounters:  06/18/18 198 lb 4 oz (89.9 kg)  06/13/18 191 lb 14.4 oz (87 kg)  06/03/18 201 lb 8 oz (91.4 kg)   Lab Results  Component Value Date   CREATININE 1.14 (H) 06/17/2018   CREATININE 1.34 (H) 06/12/2018   CREATININE 1.16 (H) 06/11/2018    Physical Exam  Constitutional: She is oriented to person, place, and time. She appears well-developed and well-nourished.  HENT:  Head: Normocephalic and atraumatic.  Neck: Normal range of motion. Neck supple. No JVD present.  Cardiovascular: Normal rate and regular rhythm.  Pulmonary/Chest: Effort normal. She has no wheezes. She has no rales.  Abdominal: Soft. She exhibits no distension. There is no tenderness.  Musculoskeletal: She exhibits no edema or tenderness.  Neurological:  She is alert and oriented to person, place, and time.  Skin: Skin is warm and dry.  Psychiatric: She has a normal mood and affect. Her behavior is normal. Thought content normal.  Nursing note and vitals reviewed.  Assessment & Plan:  1: Chronic heart failure with reduced ejection fraction- - NYHA class II - euvolemic - weighing daily; reminded to call for an overnight weight gain of >2 pounds or a weekly weight gain of >5 pounds - weight down 5 pounds from last visit 6 weeks ago - not adding salt to her food and is trying to eat low sodium foods. Continues to read food labels so that she can keep her sodium intake to <2068m daily. - walking 20-30 minutes on treadmill a few days/ week - continues to remain off entresto; discussed possibly resuming in the future if BP maintains - saw cardiology (Sharolyn Douglas 06/03/18 - saw EP (Caryl Comes 08/01/17 - BNP 05/28/18 was 1003.0 - has received her flu vaccine for this season already - no marijuana for last 2 weeks - will refer to paramedicine program due to frequent admissions recently  2: HTN- - BP looks good today - saw PCP (Wynetta Emery 06/05/18 - BMP 06/17/18 reviewed and showed sodium 136, potassium 4.1, creatinine 1.14 and GFR 63 - saw nephrology (Holley Raring recently  Patient did not bring her medications nor a list. Each medication was verbally reviewed with the patient and she was encouraged to bring the bottles to every visit to confirm accuracy of list.  Return in 1 month or sooner for any questions/problems before then.

## 2018-06-18 NOTE — Patient Instructions (Signed)
Continue weighing daily and call for an overnight weight gain of > 2 pounds or a weekly weight gain of >5 pounds. 

## 2018-06-18 NOTE — Telephone Encounter (Signed)
Please verify refill for Lasix 40 mg one tablet daily.  The patient was recently in the hospital and the dose was different.  The patient also had a BMET yesterday.  Please review for refill.

## 2018-06-18 NOTE — Telephone Encounter (Signed)
Do you mind calling her to verify how she is taking this. Her discharge summary states lasix 40 mg once daily, but there is also a note in her chart that she is taking lasix 20 mg BID.  Just let us know what she says.  She has a follow up appt with Ward Givens, NP on 06/20/18.

## 2018-06-19 ENCOUNTER — Encounter: Payer: Self-pay | Admitting: Anesthesiology

## 2018-06-19 ENCOUNTER — Ambulatory Visit
Admission: RE | Admit: 2018-06-19 | Discharge: 2018-06-19 | Disposition: A | Discharge status: Home / Self Care | Payer: Medicare Other | Source: Ambulatory Visit | Attending: Gastroenterology | Admitting: Gastroenterology

## 2018-06-19 ENCOUNTER — Encounter: Payer: Self-pay | Admitting: *Deleted

## 2018-06-19 ENCOUNTER — Other Ambulatory Visit: Payer: Self-pay

## 2018-06-19 ENCOUNTER — Encounter
Admission: RE | Disposition: A | Discharge status: Home / Self Care | Payer: Self-pay | Source: Ambulatory Visit | Attending: Gastroenterology

## 2018-06-19 DIAGNOSIS — Z1211 Encounter for screening for malignant neoplasm of colon: Secondary | ICD-10-CM

## 2018-06-19 DIAGNOSIS — Z5309 Procedure and treatment not carried out because of other contraindication: Secondary | ICD-10-CM | POA: Insufficient documentation

## 2018-06-19 DIAGNOSIS — K7460 Unspecified cirrhosis of liver: Secondary | ICD-10-CM

## 2018-06-19 LAB — GLUCOSE, CAPILLARY: Glucose-Capillary: 155 mg/dL — ABNORMAL HIGH (ref 70–99)

## 2018-06-19 SURGERY — COLONOSCOPY WITH PROPOFOL
Anesthesia: General

## 2018-06-19 MED ORDER — SODIUM CHLORIDE 0.9 % IV SOLN
INTRAVENOUS | Status: DC
  Administered 2018-06-19: 10:00:00 via INTRAVENOUS

## 2018-06-19 NOTE — Op Note (Addendum)
[facility] Patient Name: Bonnie Ochoa Procedure Date: 06/19/2018 10:08 AM MRN: [id number] Account #: [id number] Date of Birth: 14-Jun-1966 Admit Type: Inpatient Age: 52 Room: [facility] ENDO ROOM 2 Gender: Female Note Status: Finalized THIS EXAM WAS SENT IN ERROR

## 2018-06-19 NOTE — Progress Notes (Signed)
Patient cancelled by anesthesia due to recent hospital admission for heart issues.

## 2018-06-20 ENCOUNTER — Encounter: Payer: Self-pay | Admitting: Cardiology

## 2018-06-20 ENCOUNTER — Ambulatory Visit (INDEPENDENT_AMBULATORY_CARE_PROVIDER_SITE_OTHER): Payer: Medicare Other | Admitting: Nurse Practitioner

## 2018-06-20 ENCOUNTER — Encounter: Payer: Self-pay | Admitting: Nurse Practitioner

## 2018-06-20 VITALS — BP 136/100 | HR 101 | Ht 65.0 in | Wt 196.5 lb

## 2018-06-20 DIAGNOSIS — I5022 Chronic systolic (congestive) heart failure: Secondary | ICD-10-CM | POA: Diagnosis not present

## 2018-06-20 DIAGNOSIS — I10 Essential (primary) hypertension: Secondary | ICD-10-CM

## 2018-06-20 DIAGNOSIS — I428 Other cardiomyopathies: Secondary | ICD-10-CM | POA: Diagnosis not present

## 2018-06-20 DIAGNOSIS — N182 Chronic kidney disease, stage 2 (mild): Secondary | ICD-10-CM

## 2018-06-20 MED ORDER — SACUBITRIL-VALSARTAN 24-26 MG PO TABS: 1.0000 | ORAL_TABLET | Freq: Two times a day (BID) | ORAL | 6 refills | Status: DC

## 2018-06-20 NOTE — Progress Notes (Signed)
Office Visit    Patient Name: Bonnie Ochoa Date of Encounter: 06/20/2018  Primary Care Provider:  Valerie Roys, DO Primary Cardiologist:  Ida Rogue, MD  Chief Complaint    52 year old female with history of nonischemic cardia myopathy, HFrEF, diabetes, hypertension, bipolar disorder, sleep apnea, PTSD, restless legs, and hepatic cirrhosis, who presents for follow-up after recent hospitalization.  Past Medical History    Past Medical History:  Diagnosis Date  . Abdominal pain    a. 05/2018 HIDA scan wnl.  . ADHD   . Arthritis   . Asthma   . Bipolar 1 disorder ([place])   . Chest pain    a. Hx of cath in [place] - reportedly nl; b. 04/2018 MV: EF 22%, fixed dist ant septal, apical, and inferoapical defects - ? scar vs. attenuation. No ischemia.  . CHF (congestive heart failure) ([place])   . Cirrhosis of liver ([place])   . Depression   . Diabetes mellitus without complication ([place])   . Diverticulitis   . HFrEF (heart failure with reduced ejection fraction) ([place])    a. 06/2017 Echo: EF 20-25%, diff HK. Mildly dil LA; b. 05/2018 Echo: EF 25-30%, diff HK, Gr2 DD. Triv AI. Mod MR. Mildly reduced RV fxn. Mod-Sev TR. PASP 45-31mHg.  .Marland KitchenHypertension   . IBS (irritable bowel syndrome)   . Insomnia   . Migraines   . NICM (nonischemic cardiomyopathy) ([place]    a. EF prev 25%; b. 11/2014 s/p SJM Fortify Assura, single lead AICD (ser# [id number]; c. 06/2017 Echo: EF 20-25%; d. 05/2018 Echo: EF 25-30%, diff HK, Gr2 DD; e. 05/2018 Cath: Nl cors. LVEDP 289mg, PCWP 3239m. PA 65/40 (52). CO/CI 3.04/1.56.  . OSA (obstructive sleep apnea)   . PTSD (post-traumatic stress disorder)   . Restless leg syndrome   . Sleep apnea   . Vertigo    Past Surgical History:  Procedure Laterality Date  . ABDOMINAL HYSTERECTOMY    . CARDIAC CATHETERIZATION    . debribalator  2016  . INSERT / REPLACE / REMOVE PACEMAKER    . INSERTION OF ICD    . RIGHT/LEFT HEART CATH AND CORONARY ANGIOGRAPHY N/A 06/09/2018   Procedure: RIGHT/LEFT HEART CATH AND CORONARY ANGIOGRAPHY;  Surgeon: AriWellington HampshireD;  Location: [facility];  Service: Cardiovascular;  Laterality: N/A;  . TONSILLECTOMY    . TONSILLECTOMY    . TONSILLECTOMY    . TUBAL LIGATION  1980  . TUBAL LIGATION      Allergies  Allergies  Allergen Reactions  . Levothyroxine Rash    History of Present Illness    [age]ar old female with the above complex past medical history including nonischemic cardia myopathy with an EF of 20 to 25%, hypertension, diabetes, bipolar disorder, sleep apnea, PTSD, restless leg syndrome, hepatic cirrhosis, and recent bouts of right upper quadrant pain.  She has had multiple hospitalizations over the past 2 months with prerenal azotemia and acute renal failure in September in the setting of nausea, diarrhea, and diuretic therapy.  This responded well to IV fluids and holding of Entresto, Spironolactone, and Lasix.  She was readmitted September 26 with chest pain and minimal troponin elevation.  She was seen by our team and underwent stress testing which was nonischemic.  Beta-blocker was resumed at discharge while Entresto and diuretic therapy was held in the setting of mild renal insufficiency.  She was readmitted October 14 with right upper quadrant pain.  This was initially suspected to be secondary to cholecystitis  however HIDA scan was normal.  Troponins again were minimally elevated.  She was seen in clinic on October 22 and weight had been stable at her home scale.  She noted mild dyspnea on exertion but volume was relatively stable.  She was advised to resume Lasix on a as needed basis with plan for early follow-up and resumption's of Entresto and Spironolactone in the future.  Unfortunately, she was readmitted to [facility] October 26 with chest pain and was found to be volume overloaded.  Troponin was again mildly elevated.  Renal function was stable.  She was diuresed.  Echocardiogram showed an EF  of 25 to 30% with diffuse hypokinesis.  Decision was made to pursue right and left heart cardiac catheterization and this showed normal coronary arteries with an LVEDP of 28 mmHg.  Cardiac output was 3.04 with an index of 1.56.  Following additional diuresis, she did have slight rise in creatinine.  She was advised to start Lasix 40 mg daily on November 2 and was continued on carvedilol 12.5 mill grams twice daily.  Because of soft blood pressures and mild renal insufficiency, Entresto was not resumed.  She follow-up in heart failure clinic on November 6 and was stable at that time.  Today, she reports relative stability.  Her weight has been stable on her home scale at 192 pounds.  She has not had any chest pain or abdominal discomfort and further denies dyspnea, PND, orthopnea, dizziness, syncope, edema, or early satiety.  She was supposed to have had a colonoscopy yesterday however this was canceled in the setting of recent cardiac hospitalizations.  Home Medications    Prior to Admission medications   Medication Sig Start Date End Date Taking? Authorizing Provider  blood glucose meter kit and supplies KIT Dispense based on patient and insurance preference. Use up to four times daily as directed. (FOR ICD-9 250.00, 250.01). Patient not taking: Reported on 06/19/2018 07/24/17   Park Liter P, DO  carvedilol (COREG) 12.5 MG tablet Take 1 tablet (12.5 mg total) by mouth 2 (two) times daily. 06/03/18 09/01/18  Theora Gianotti, NP  dicyclomine (BENTYL) 20 MG tablet Take 1 tablet (20 mg total) by mouth 3 (three) times daily as needed for spasms. Patient not taking: Reported on 06/18/2018 05/08/18   Park Liter P, DO  famotidine (PEPCID) 40 MG tablet Take 1 tablet (40 mg total) by mouth every evening. 04/25/18 04/25/19  Park Liter P, DO  furosemide (LASIX) 40 MG tablet Take 1 tablet (40 mg total) by mouth daily. 06/14/18   Wellington Hampshire, MD  Insulin Glargine (LANTUS SOLOSTAR) 100 UNIT/ML  Solostar Pen Inject 20 Units into the skin daily at 10 pm. 04/27/18   Bettey Costa, MD  Insulin Pen Needle 32G X 6 MM MISC 1 each by Does not apply route daily. 04/07/18   Johnson, Megan P, DO  lamoTRIgine (LAMICTAL) 25 MG tablet Take 1 tablet (25 mg total) by mouth 2 (two) times daily. 03/25/18   [name], MD  metoCLOPramide (REGLAN) 10 MG tablet Take 1 tablet (10 mg total) by mouth 3 (three) times daily with meals. 05/01/18 05/01/19  Park Liter P, DO  nitroGLYCERIN (NITROSTAT) 0.4 MG SL tablet Place 1 tablet (0.4 mg total) under the tongue every 5 (five) minutes as needed for chest pain. 05/19/18 05/19/19  [name], MD  ondansetron (ZOFRAN ODT) 4 MG disintegrating tablet Take 1 tablet (4 mg total) by mouth every 8 (eight) hours as needed for nausea or vomiting.  02/14/18   Nance Pear, MD  pantoprazole (PROTONIX) 40 MG tablet Take 1 tablet (40 mg total) by mouth daily. 08/28/17 08/28/18  [name], MD  PARoxetine (PAXIL) 30 MG tablet TAKE 1 TABLET BY MOUTH EVERY DAY 03/14/18   Volney American, PA-C  QUEtiapine (SEROQUEL) 300 MG tablet TAKE 1 TABLET BY MOUTH EVERYDAY AT BEDTIME Patient taking differently: Take 300 mg by mouth at bedtime.  03/14/18   Volney American, PA-C  sucralfate (CARAFATE) 1 g tablet Take 1 tablet (1 g total) by mouth 4 (four) times daily. 05/01/18   Johnson, Megan P, DO  traMADol (ULTRAM) 50 MG tablet Take 1 tablet (50 mg total) by mouth every 8 (eight) hours as needed for moderate pain. 06/01/18   Henreitta Leber, MD    Review of Systems    Doing well since most recent hospitalization.  She denies chest pain, palpitations, dyspnea, PND, orthopnea, dizziness, syncope, edema, or early satiety.  Abdominal discomfort has resolved..  All other systems reviewed and are otherwise negative except as noted above.  Physical Exam    VS:  BP (!) 136/100 (BP Location: Left Arm, Patient Position: Sitting, Cuff Size: Large)   Pulse (!) 101   Ht 5' 5"  (1.651 m)    Wt 196 lb 8 oz (89.1 kg)   BMI 32.70 kg/m  , BMI Body mass index is 32.7 kg/m. GEN: Well nourished, well developed, in no acute distress. HEENT: normal. Neck: Supple, no JVD, carotid bruits, or masses. Cardiac: RRR, no murmurs, rubs, or gallops. No clubbing, cyanosis, edema.  Radials/DP/PT 2+ and equal bilaterally.  Respiratory:  Respirations regular and unlabored, clear to auscultation bilaterally. GI: Soft, nontender, nondistended, BS + x 4. MS: no deformity or atrophy. Skin: warm and dry, no rash. Neuro:  Strength and sensation are intact. Psych: Normal affect.  Accessory Clinical Findings    ECG personally reviewed by me today -sinus tachycardia, 101, nonspecific T changes.- no acute changes.  Lab Results  Component Value Date   CREATININE 1.14 (H) 06/17/2018   BUN 17 06/17/2018   NA 136 06/17/2018   K 4.1 06/17/2018   CL 99 06/17/2018   CO2 20 06/17/2018    Assessment & Plan    1.  HFrEF/nonischemic cardiomyopathy: Multiple hospitalizations in the setting of dehydration and prerenal azotemia in September resulting in discontinuation of heart failure meds and diuretics.  More recently, she was admitted October 26 with chest pain and mild troponin elevation.  She was volume overloaded.  Repeat echo showed an EF of 25 to 30%.  Right and left heart catheterization showed normal coronary arteries with elevated LVEDP, pulmonary hypertension, and low output with cardiac output of 3.04 and an index of 1.56.  Following additional diuresis, she did have mild rising creatinine but was subsequently discharged home on Lasix 40 mg daily.  She has been doing well on this dose and creatinine was stable on follow-up on November 5.  BP higher today.  I will add back low-dose entresto today and plan a follow-up basic metabolic panel in 1 week.  We also discussed her prognosis at length.  I am concerned about low output heart failure as evidenced by higher heart rates at baseline, cardiorenal  syndrome, and LFT abnormalities in September.  I suspect some of her abdominal discomfort recently has been related to CHF as well.  In that setting, she is agreeable to entertain referral to our advanced heart failure clinic in [place].  We will arrange for this.  I will otherwise plan to see her back in 2 weeks.  Renal function stable, we can consider initiation of digoxin therapy.  2.  Essential hypertension: Adding back low-dose Entresto with plan to evaluate basic metabolic panel next week.  Continue current dose of carvedilol.  3.  Chest pain: No recurrence since recent hospitalization.  Catheterization showed normal coronary arteries.  4.  Stage II-III chronic kidney disease: Creatinine was stable at 1.14 in November 5.  Follow-up again next week in the setting of reinitiation of Entresto.  5.  Disposition: Follow-up basic metabolic panel in 1 week.  I will see her back in clinic in 2 weeks.  Referral to heart failure clinic in [place].   Murray Hodgkins, NP 06/20/2018, 11:05 AM

## 2018-06-20 NOTE — Patient Instructions (Addendum)
Medication Instructions:  Your physician has recommended you make the following change in your medication:   1) Start Entresto 24/26 mg. Take one tablet by mouth twice daily.   Samples Given: Entresto 24/26 mg Lot: [id number] Exp: 6/21 #1 box  If you need a refill on your cardiac medications before your next appointment, please call your pharmacy.   Lab work: Your physician recommends that you return for lab work in: at [facility] 1 week. BMP.   If you have labs (blood work) drawn today and your tests are completely normal, you will receive your results only by: Marland Kitchen MyChart Message (if you have MyChart) OR . A paper copy in the mail If you have any lab test that is abnormal or we need to change your treatment, we will call you to review the results.  Testing/Procedures: No testing procedures scheduled.   Follow-Up: At [facility], you and your health needs are our priority.  As part of our continuing mission to provide you with exceptional heart care, we have created designated Provider Care Teams.  These Care Teams include your primary Cardiologist (physician) and Advanced Practice Providers (APPs -  Physician Assistants and Nurse Practitioners) who all work together to provide you with the care you need, when you need it. You will need a follow up appointment in 2 weeks with Ward Givens, NP   You have been referred to [facility]. They will call you with an appointment for appointment in 1-2 months.    Any Other Special Instructions Will Be Listed Below (If Applicable). None

## 2018-06-20 NOTE — Telephone Encounter (Signed)
Bonnie Ochoa was here today to see Johnette Abraham, NP and the correct dose of Lasix is 20 mg twice a day.

## 2018-06-23 ENCOUNTER — Encounter: Payer: Self-pay | Admitting: Family Medicine

## 2018-06-23 ENCOUNTER — Ambulatory Visit (INDEPENDENT_AMBULATORY_CARE_PROVIDER_SITE_OTHER): Payer: Medicare Other | Admitting: Family Medicine

## 2018-06-23 VITALS — BP 131/87 | HR 93 | Temp 98.9°F | Ht 65.0 in | Wt 197.2 lb

## 2018-06-23 DIAGNOSIS — H547 Unspecified visual loss: Secondary | ICD-10-CM

## 2018-06-23 DIAGNOSIS — N179 Acute kidney failure, unspecified: Secondary | ICD-10-CM

## 2018-06-23 DIAGNOSIS — H2589 Other age-related cataract: Secondary | ICD-10-CM | POA: Diagnosis not present

## 2018-06-23 DIAGNOSIS — I214 Non-ST elevation (NSTEMI) myocardial infarction: Secondary | ICD-10-CM | POA: Diagnosis not present

## 2018-06-23 DIAGNOSIS — I5022 Chronic systolic (congestive) heart failure: Secondary | ICD-10-CM

## 2018-06-23 LAB — HM DIABETES EYE EXAM

## 2018-06-23 NOTE — Progress Notes (Signed)
BP 131/87   Pulse 93   Temp 98.9 F (37.2 C) (Oral)   Ht _0  (1.651 m)   Wt 197 lb 3.2 oz (89.4 kg)   SpO2 97%   BMI 32.82 kg/m    Subjective:    Patient ID: Bonnie Ochoa, female    DOB: 1966-05-26, 52 y.o.   MRN: [id number]  HPI: Bonnie Ochoa is a 52 y.o. female  Chief Complaint  Patient presents with  . Pain  . Eye Problem    pt states she has a green spot in her eye that she noticied last night, states she cant see   Transition of Care Hospital Follow up.   Hospital/Facility: [facility] D/C Physician: Dr. Vianne Bulls D/C Date: 06/13/18  Records Requested: 06/13/18 Records Received: 06/13/18 Records Reviewed: 06/13/18  Diagnoses on Discharge: NSTEMI  Date of interactive Contact within 48 hours of discharge: 06/16/18 Contact was through: phone  Date of 7 day or 14 day face-to-face visit: 06/23/18   within 14 days  Outpatient Encounter Medications as of 06/23/2018  Medication Sig Note  . blood glucose meter kit and supplies KIT Dispense based on patient and insurance preference. Use up to four times daily as directed. (FOR ICD-9 250.00, 250.01).   . carvedilol (COREG) 12.5 MG tablet Take 1 tablet (12.5 mg total) by mouth 2 (two) times daily.   Marland Kitchen dicyclomine (BENTYL) 20 MG tablet Take 1 tablet (20 mg total) by mouth 3 (three) times daily as needed for spasms. 06/16/2018: Has not needed recently post-hospital discharge 06/13/18  . famotidine (PEPCID) 40 MG tablet Take 1 tablet (40 mg total) by mouth every evening.   . furosemide (LASIX) 40 MG tablet Take 1 tablet (40 mg total) by mouth daily.   . Insulin Glargine (LANTUS SOLOSTAR) 100 UNIT/ML Solostar Pen Inject 20 Units into the skin daily at 10 pm.   . Insulin Pen Needle 32G X 6 MM MISC 1 each by Does not apply route daily.   Marland Kitchen lamoTRIgine (LAMICTAL) 25 MG tablet Take 1 tablet (25 mg total) by mouth 2 (two) times daily.   . metoCLOPramide (REGLAN) 10 MG tablet Take 1 tablet (10 mg total) by mouth 3 (three) times daily with  meals.   . nitroGLYCERIN (NITROSTAT) 0.4 MG SL tablet Place 1 tablet (0.4 mg total) under the tongue every 5 (five) minutes as needed for chest pain. 06/16/2018: Reports has not needed post-hospital discharge  . ondansetron (ZOFRAN ODT) 4 MG disintegrating tablet Take 1 tablet (4 mg total) by mouth every 8 (eight) hours as needed for nausea or vomiting.   . pantoprazole (PROTONIX) 40 MG tablet Take 1 tablet (40 mg total) by mouth daily.   Marland Kitchen PARoxetine (PAXIL) 30 MG tablet TAKE 1 TABLET BY MOUTH EVERY DAY   . QUEtiapine (SEROQUEL) 300 MG tablet TAKE 1 TABLET BY MOUTH EVERYDAY AT BEDTIME (Patient taking differently: Take 300 mg by mouth at bedtime. )   . sacubitril-valsartan (ENTRESTO) 24-26 MG Take 1 tablet by mouth 2 (two) times daily.   . sucralfate (CARAFATE) 1 g tablet Take 1 tablet (1 g total) by mouth 4 (four) times daily. 06/07/2018: Takes as needed  . traMADol (ULTRAM) 50 MG tablet Take 1 tablet (50 mg total) by mouth every 8 (eight) hours as needed for moderate pain. 06/16/2018: Has not needed recently post-hospital discharge   No facility-administered encounter medications on file as of 06/23/2018.    Hospital Course Per Hospitalist:  "Hospital Course  Acute on chronic systolic heart failure:  Improved with IV Lasix, seen by cardiology with Dr. Fletcher Anon, patient received her home dose Coreg, Entresto but patient blood pressure was low during hospitalization and then discontinued Lasix, stopped Entresto, patient hypotension improved, seen by cardiology, advised to restart Coreg, Lasix, discharged home with them and patient Delene Loll not started at discharge time, patient to see Dr. Fletcher Anon in few days to restart Entresto based on her renal function, blood pressure. 2.  History of anxiety, depression: Continue home meds 3.  Diabetes mellitus type 2, patient is on Lantus."  Diagnostic Tests Reviewed:   CLINICAL DATA:  Chest pain.  Shortness of breath.  EXAM: CHEST - 2 VIEW  COMPARISON:   May 31, 2018  FINDINGS: Stable cardiomegaly. Minimal interstitial prominence without overt edema. Mild atelectasis in the left base. Stable AICD device. No other interval change.  IMPRESSION: Cardiomegaly and probable mild pulmonary venous congestion.  Result status: Final result                   *[place], [place] [address]                            [phone]  ------------------------------------------------------------------- Transthoracic Echocardiography  Patient:    Bonnie, Ochoa MR #:       [id number] Study Date: 06/08/2018 Gender:     F Age:        79 Height:     162.6 cm Weight:     89.7 kg BSA:        2.05 m^2 Pt. Status: Room:   ATTENDING    Nelva Bush, MD  ORDERING     Nelva Bush, MD  REFERRING    Nelva Bush, MD  PERFORMING   [facility] [employer]  SONOGRAPHER  Charmayne Sheer, RDCS  cc:  ------------------------------------------------------------------- LV EF: 25% -   30%  ------------------------------------------------------------------- Indications:      Chest Pain 786.50.  ------------------------------------------------------------------- History:   PMH:  NICM, HFrEF.  Risk factors:  Diabetes mellitus.   ------------------------------------------------------------------- Study Conclusions  - Left ventricle: The cavity size was severely dilated. Wall   thickness was at the upper limits of normal. Systolic function   was severely reduced. The estimated ejection fraction was in the   range of 25% to 30%. Diffuse hypokinesis. Features are consistent   with a pseudonormal left ventricular filling pattern, with   concomitant abnormal relaxation and increased filling pressure   (grade 2 diastolic dysfunction). Doppler parameters are   consistent with high ventricular filling pressure. - Aortic valve: Trileaflet; mildly thickened,  mildly calcified   leaflets. There was trivial regurgitation. - Mitral valve: There was at least moderate regurgitation. - Left atrium: The atrium was mildly dilated. - Right ventricle: The cavity size was mildly dilated. Wall   thickness was normal. Systolic function was mildly reduced. - Right atrium: The atrium was mildly dilated. - Tricuspid valve: There was moderate-severe regurgitation. - Pulmonary arteries: Systolic pressure was moderately increased,   in the range of 45 mm Hg to 50 mm Hg. - Inferior vena cava: The vessel was dilated. The respirophasic   diameter changes were blunted (< 50%), consistent with elevated   central venous  pressure. - Pericardium, extracardiac: A small pericardial effusion was   identified.  ------------------------------------------------------------------- Labs, prior tests, procedures, and surgery: Permanent pacemaker system implantation.  ------------------------------------------------------------------- Study data:   Study status:  Routine.  Procedure:  The patient reported no pain pre or post test. Transthoracic echocardiography for left ventricular function evaluation, for right ventricular function evaluation, and for assessment of valvular function. Image quality was fair.  Study completion:  There were no complications.         Transthoracic echocardiography.  M-mode, complete 2D, spectral Doppler, and color Doppler.  Birthdate:  Patient birthdate: 1966-08-02.  Age:  Patient is 52 yr old.  Sex:  Gender: female.    BMI: 33.9 kg/m^2.  Blood pressure:     126/98  Patient status:  Inpatient.  Study date:  Study date: 06/08/2018. Study time: 01:01 PM.  Location:  Bedside.  -------------------------------------------------------------------  ------------------------------------------------------------------- Left ventricle:  The cavity size was severely dilated. Wall thickness was at the upper limits of normal. Systolic function  was severely reduced. The estimated ejection fraction was in the range of 25% to 30%. Diffuse hypokinesis. Features are consistent with a pseudonormal left ventricular filling pattern, with concomitant abnormal relaxation and increased filling pressure (grade 2 diastolic dysfunction). Doppler parameters are consistent with high ventricular filling pressure.  ------------------------------------------------------------------- Aortic valve:   Trileaflet; mildly thickened, mildly calcified leaflets. Cusp separation was normal.  Doppler:  Transvalvular velocity was within the normal range. There was no stenosis. There was trivial regurgitation.    VTI ratio of LVOT to aortic valve: 0.59. Valve area (VTI): 1.84 cm^2. Indexed valve area (VTI): 0.9 cm^2/m^2. Peak velocity ratio of LVOT to aortic valve: 0.56. Valve area (Vmax): 1.77 cm^2. Indexed valve area (Vmax): 0.86 cm^2/m^2. Mean velocity ratio of LVOT to aortic valve: 0.53. Valve area (Vmean): 1.67 cm^2. Indexed valve area (Vmean): 0.82 cm^2/m^2. Mean gradient (S): 6 mm Hg. Peak gradient (S): 9 mm Hg.  ------------------------------------------------------------------- Aorta:  Aortic root: The aortic root was normal in size.  ------------------------------------------------------------------- Mitral valve:   Mildly thickened leaflets . Leaflet separation was normal.  Doppler:  Transvalvular velocity was within the normal range. There was no evidence for stenosis. There was at least moderate regurgitation.    Valve area by pressure half-time: 4.23 cm^2. Indexed valve area by pressure half-time: 2.06 cm^2/m^2. Valve area by continuity equation (using LVOT flow): 1.8 cm^2. Indexed valve area by continuity equation (using LVOT flow): 0.88 cm^2/m^2.    Mean gradient (D): 3 mm Hg. Peak gradient (D): 4 mm Hg.  ------------------------------------------------------------------- Left atrium:  The atrium was mildly  dilated.  ------------------------------------------------------------------- Right ventricle:  The cavity size was mildly dilated. Wall thickness was normal. Pacer wire or catheter noted in right ventricle. Systolic function was mildly reduced.  ------------------------------------------------------------------- Pulmonic valve:    Structurally normal valve.   Cusp separation was normal.  Doppler:  Transvalvular velocity was within the normal range. There was no regurgitation.  ------------------------------------------------------------------- Tricuspid valve:   Structurally normal valve.   Leaflet separation was normal.  Doppler:  Transvalvular velocity was within the normal range. There was moderate-severe regurgitation.  ------------------------------------------------------------------- Pulmonary artery:   The main pulmonary artery was mildly dilated. Systolic pressure was moderately increased, in the range of 45 mm Hg to 50 mm Hg.  ------------------------------------------------------------------- Right atrium:  The atrium was mildly dilated.  ------------------------------------------------------------------- Pericardium:  A small pericardial effusion was identified.  ------------------------------------------------------------------- Systemic veins: Inferior vena cava: The vessel was dilated. The respirophasic diameter changes were blunted (< 50%), consistent with  elevated central venous pressure.  ------------------------------------------------------------------- Measurements   Left ventricle                           Value          Reference  LV ID, ED, PLAX chordal          (H)     64.1  mm       43 - 52  LV ID, ES, PLAX chordal          (H)     57.8  mm       23 - 38  LV fx shortening, PLAX chordal   (L)     10    %        >=29  LV PW thickness, ED                      9.41  mm       ----------  IVS/LV PW ratio, ED                      0.84            <=1.3  Stroke volume, 2D                        46    ml       ----------  Stroke volume/bsa, 2D                    22    ml/m^2   ----------  LV ejection fraction, 1-p A4C            32    %        ----------  LV end-diastolic volume, 2-p             103   ml       ----------  LV end-systolic volume, 2-p              74    ml       ----------  LV ejection fraction, 2-p                29    %        ----------  Stroke volume, 2-p                       29    ml       ----------  LV end-diastolic volume/bsa, 2-p         50    ml/m^2   ----------  LV end-systolic volume/bsa, 2-p          36    ml/m^2   ----------  Stroke volume/bsa, 2-p                   14.3  ml/m^2   ----------  LV e&', lateral                           5.22  cm/s     ----------  LV E/e&', lateral                         18.97          ----------  LV e&', medial  4.35  cm/s     ----------  LV E/e&', medial                          22.76          ----------  LV e&', average                           4.79  cm/s     ----------  LV E/e&', average                         20.69          ----------    Ventricular septum                       Value          Reference  IVS thickness, ED                        7.89  mm       ----------    LVOT                                     Value          Reference  LVOT ID, S                               20    mm       ----------  LVOT area                                3.14  cm^2     ----------  LVOT ID                                  20    mm       ----------  LVOT peak velocity, S                    85.7  cm/s     ----------  LVOT mean velocity, S                    61.3  cm/s     ----------  LVOT VTI, S                              14.6  cm       ----------  Stroke volume (SV), LVOT DP              45.9  ml       ----------  Stroke index (SV/bsa), LVOT DP           22.4  ml/m^2   ----------    Aortic valve                             Value           Reference  Aortic valve peak velocity, S  152   cm/s     ----------  Aortic valve mean velocity, S            115   cm/s     ----------  Aortic valve VTI, S                      24.9  cm       ----------  Aortic mean gradient, S                  6     mm Hg    ----------  Aortic peak gradient, S                  9     mm Hg    ----------  VTI ratio, LVOT/AV                       0.59           ----------  Aortic valve area, VTI                   1.84  cm^2     ----------  Aortic valve area/bsa, VTI               0.9   cm^2/m^2 ----------  Velocity ratio, peak, LVOT/AV            0.56           ----------  Aortic valve area, peak velocity         1.77  cm^2     ----------  Aortic valve area/bsa, peak              0.86  cm^2/m^2 ----------  velocity  Velocity ratio, mean, LVOT/AV            0.53           ----------  Aortic valve area, mean velocity         1.67  cm^2     ----------  Aortic valve area/bsa, mean              0.82  cm^2/m^2 ----------  velocity    Aorta                                    Value          Reference  Aortic root ID, ED                       26    mm       ----------    Left atrium                              Value          Reference  LA ID, A-P, ES                           53    mm       ----------  LA ID/bsa, A-P                   (H)     2.59  cm/m^2   <=2.2  LA volume, S  83.1  ml       ----------  LA volume/bsa, S                         40.5  ml/m^2   ----------  LA volume, ES, 1-p A4C                   80.4  ml       ----------  LA volume/bsa, ES, 1-p A4C               39.2  ml/m^2   ----------  LA volume, ES, 1-p A2C                   75.1  ml       ----------  LA volume/bsa, ES, 1-p A2C               36.6  ml/m^2   ----------    Mitral valve                             Value          Reference  Mitral E-wave peak velocity              99    cm/s     ----------  Mitral A-wave peak velocity              56.8  cm/s      ----------  Mitral mean velocity, D                  77.4  cm/s     ----------  Mitral deceleration time                 190   ms       150 - 230  Mitral pressure half-time                52    ms       ----------  Mitral mean gradient, D                  3     mm Hg    ----------  Mitral peak gradient, D                  4     mm Hg    ----------  Mitral E/A ratio, peak                   1.7            ----------  Mitral valve area, PHT, DP               4.23  cm^2     ----------  Mitral valve area/bsa, PHT, DP           2.06  cm^2/m^2 ----------  Mitral valve area, LVOT                  1.8   cm^2     ----------  continuity  Mitral valve area/bsa, LVOT              0.88  cm^2/m^2 ----------  continuity  Mitral annulus VTI, D                    25.5  cm       ----------  Right atrium                             Value          Reference  RA ID, S-I, ES                           58.3  mm       ----------  RA ID, M-L, ES, A4C                      44.4  mm       30 - 46  RA ID, S-I, ES, A4C              (H)     62    mm       34 - 49  RA area, ES, A4C                 (H)     20.7  cm^2     8.3 - 19.5  RA volume, ES, A/L                       53.1  ml       ----------  RA volume/bsa, ES, A/L                   25.9  ml/m^2   ----------    Right ventricle                          Value          Reference  RV ID, minor axis, ED, A4C base          36.8  mm       ----------    Pulmonic valve                           Value          Reference  Pulmonic valve peak velocity, S          106   cm/s     ----------  Pulmonic acceleration time               129   ms       ----------  Legend: (L)  and  (H)  mark values outside specified reference range.  ------------------------------------------------------------------- Prepared and Electronically Authenticated by  Nelva Bush, MD 2019-10-27T23:27:21   1.  Normal coronary arteries. 2.  Severely reduced LV systolic function by  echo.  Left ventricular angiography was not performed. 3.  Right heart catheterization showed severely elevated filling pressures with a wedge pressure of 32 mmHg, severe pulmonary hypertension with mean pulmonary pressure of 52 mmHg and severely reduced cardiac output at 3.04 with a cardiac index of 1.56.  Recommendations: The patient has nonischemic cardiomyopathy. She continues to be severely volume overloaded.  I am going to resume IV diuresis. I elected to decrease the dose of carvedilol to 6.25 mg twice daily in order to allow initiation of Entresto. If renal function worsens with diuresis, the patient might need short-term inotrope support.  Disposition: Home  Consults: Cardiology, nephrology  Discharge Instructions: Follow up here and with Dr. Fletcher Anon  Disease/illness Education:  Given in writing  Home Health/Community Services Discussions/Referrals: N/A  Establishment or re-establishment of referral orders for community resources: N/A  Discussion with other health care providers: Call to [facility]- will see her today  Assessment and Support of treatment regimen adherence:  [place] with:  Patient and her wife  EYE PROBLEM Duration:  9-10PM Involved eye:  right Onset: sudden- vision looked like It was closing in in the R eye and then her vision disappeared and she had a cloudy look in her eye. Cannot see anything.  Severity: severe  Quality: no pain Foreign body sensation:yes Visual impairment: yes Eye redness: no Discharge: no Crusting or matting of eyelids: no Swelling: no Photophobia: no Itching: no Tearing: no Headache: no Floaters: no URI symptoms: no Contact lens use: no Close contacts with similar problems: no Eye trauma: no Status: stable  Relevant past medical, surgical, family and social history reviewed and updated as indicated. Interim medical history since our last visit reviewed. Allergies and medications reviewed and  updated.  Review of Systems  Constitutional: Negative.   HENT: Negative.   Eyes: Positive for visual disturbance. Negative for photophobia, pain, discharge, redness and itching.  Respiratory: Negative.   Cardiovascular: Negative.   Musculoskeletal: Negative.   Neurological: Negative.   Hematological: Negative.   Psychiatric/Behavioral: Negative.     Per HPI unless specifically indicated above     Objective:    BP 131/87   Pulse 93   Temp 98.9 F (37.2 C) (Oral)   Ht _0  (1.651 m)   Wt 197 lb 3.2 oz (89.4 kg)   SpO2 97%   BMI 32.82 kg/m   Wt Readings from Last 3 Encounters:  06/23/18 197 lb 3.2 oz (89.4 kg)  06/20/18 196 lb 8 oz (89.1 kg)  06/19/18 192 lb (87.1 kg)    Physical Exam  Constitutional: She is oriented to person, place, and time. She appears well-developed and well-nourished. No distress.  HENT:  Head: Normocephalic and atraumatic.  Right Ear: Hearing normal.  Left Ear: Hearing normal.  Nose: Nose normal.  Eyes: Pupils are equal, round, and reactive to light. Conjunctivae, EOM and lids are normal. Right eye exhibits no discharge. Left eye exhibits no discharge. No scleral icterus. Right pupil is round and reactive. Left pupil is round and reactive. Pupils are equal.    Not able to see peripheral vision, not able to see anything out of her eye  Cardiovascular: Normal rate, regular rhythm, normal heart sounds and intact distal pulses. Exam reveals no gallop and no friction rub.  No murmur heard. Pulmonary/Chest: Effort normal and breath sounds normal. No stridor. No respiratory distress. She has no wheezes. She has no rales. She exhibits no tenderness.  Musculoskeletal: Normal range of motion. She exhibits no edema, tenderness or deformity.  Neurological: She is alert and oriented to person, place, and time. She displays normal reflexes. No cranial nerve deficit or sensory deficit. She exhibits normal muscle tone. Coordination normal.  Skin: Skin is warm,  dry and intact. Capillary refill takes less than 2 seconds. No rash noted. She is not diaphoretic. No erythema. No pallor.  Psychiatric: She has a normal mood and affect. Her speech is normal and behavior is normal. Judgment and thought content normal. Cognition and memory are normal.  Nursing note and vitals reviewed.   Results for orders placed or performed during the hospital encounter of 06/19/18  Glucose, capillary  Result Value Ref Range   Glucose-Capillary 155 (H) 70 - 99 mg/dL      Assessment & Plan:  Problem List Items Addressed This Visit      Cardiovascular and Mediastinum   Chronic systolic heart failure (HCC) (Chronic)    Euvolemic today. Doing well. Continue to follow with cardiology. Will establish with the advanced CHF clinic in [place]. Monitor closely.      NSTEMI (non-ST elevated myocardial infarction) ([place])    Continue to follow with cardiology. Will keep BP, sugars and cholesterol under good control. Call with any concerns. Continue to monitor.        Other Visit Diagnoses    Loss of vision    -  Primary   New onset last night. Cannot see anything, no peripheral vision, eye reacts to light. To go immediately from here to opthamology.    AKI (acute kidney injury) ([place])       Rechecking labs today. Await results.    Relevant Orders   Basic metabolic panel       Follow up plan: Return in about 4 weeks (around 07/21/2018).

## 2018-06-23 NOTE — Assessment & Plan Note (Signed)
Continue to follow with cardiology. Will keep BP, sugars and cholesterol under good control. Call with any concerns. Continue to monitor.

## 2018-06-23 NOTE — Assessment & Plan Note (Signed)
Euvolemic today. Doing well. Continue to follow with cardiology. Will establish with the advanced CHF clinic in [place]. Monitor closely.

## 2018-06-24 ENCOUNTER — Inpatient Hospital Stay: Payer: Medicare Other | Admitting: Family Medicine

## 2018-06-24 ENCOUNTER — Other Ambulatory Visit: Payer: Self-pay | Admitting: *Deleted

## 2018-06-24 LAB — BASIC METABOLIC PANEL
BUN/Creatinine Ratio: 11 (ref 9–23)
BUN: 12 mg/dL (ref 6–24)
CALCIUM: 9.5 mg/dL (ref 8.7–10.2)
CO2: 24 mmol/L (ref 20–29)
Chloride: 101 mmol/L (ref 96–106)
Creatinine, Ser: 1.07 mg/dL — ABNORMAL HIGH (ref 0.57–1.00)
GFR calc non Af Amer: 59 mL/min/{1.73_m2} — ABNORMAL LOW (ref >59)
GFR, EST AFRICAN AMERICAN: 68 mL/min/{1.73_m2} (ref >59)
Glucose: 123 mg/dL — ABNORMAL HIGH (ref 65–99)
Potassium: 3.9 mmol/L (ref 3.5–5.2)
Sodium: 140 mmol/L (ref 134–144)

## 2018-06-24 MED ORDER — FUROSEMIDE 40 MG PO TABS: 40.0000 mg | ORAL_TABLET | Freq: Every day | ORAL | 6 refills | Status: DC

## 2018-06-26 ENCOUNTER — Other Ambulatory Visit: Payer: Medicare Other

## 2018-06-26 ENCOUNTER — Encounter (HOSPITAL_COMMUNITY): Payer: Self-pay

## 2018-06-26 ENCOUNTER — Other Ambulatory Visit (HOSPITAL_COMMUNITY): Payer: Self-pay

## 2018-06-26 ENCOUNTER — Encounter: Payer: Self-pay | Admitting: *Deleted

## 2018-06-26 ENCOUNTER — Other Ambulatory Visit: Payer: Self-pay | Admitting: *Deleted

## 2018-06-26 NOTE — Patient Outreach (Signed)
[employer] Customer service manager [name]) Care Management [facility] CM Telephone Outreach  PCP office completes Transition of Care follow up post-hospital discharge Post-hospital discharge day # 13  06/26/2018  Bonnie Ochoa November 14, 1965 [id number]  Successful telephone outreach to [name], 52 year old female referred to [name] Community CM by inpatient RN CM after recent hospitalization October 26-June 13, 2018 for acute on chronic CHF exacerbation.  Patient was discharged home to self care without home health services in place.  Patient has history including, but not limited to, CHF/ cardiomyopathy; previous NSTEMI in 2016 with ICD placement; OSA; HTN; DM; Bipolar disorder; cirrhosis.  HIPAA/ identity verified with patient during phone call today.  Today, patient reports that she is "doing just fine," and she denies concerns, pain, new/ recent falls post-hospital discharge and sounds to be in no obvious distress throughout today's phone call.  Patient reports that she has a recent episode where "one night all of a sudden, lost vision in (R) eye;" states that she had scheduled PCP office visit the next day and was sent immediately to the eye doctor, where it was determined that she had "sudden onset of cataracts" in (R) eye.  States that she "is fine," and verbalizes plan to have upcoming cataract surgery, "probably in January 2020."  Denies that her vision limitations from this diagnosis is bothering her; states, "they told me not to worry about it, it will get better as soon as I have the surgery."  Patient further reports:  Medications: -- Has all medicationsand takes as prescribed;denies questions/ concerns around current medications.  -- acknowledges that she was recently re- started on Entresto; confirms that she has obtained and is taking as prescribed "twice daily;" reports obtained samples from cardiology provider  Provider appointments: -- attended recent provider appointments as  scheduled/ visits reviewed with patient today: June 17, 2018- lab appointment June 18, 2018- heart failure office visit June 19, 2018- scheduled/ planned colonoscopy as outpatient June 20, 2018- cardiology provider office visit June 23, 2018- PCP office visit -- all upcoming provider appointments reviewed; patient verbalizes good understanding of appointments and reports family will continue to provide transportation and will attend with her  Self-health management of CHF: -- has continued to monitor/ recording daily weights;  role of daily weight monitoring/ recording at home was reviewed with patient today. -- reviewed recent weight ranges with patient: 196-197 lbs consistently per patient report, with a weight this morning of 197 lbs -- today, patient is able to verbalize weight gain guidelines in setting of CHF to promptly report weight gain > 3 lbs overnight/ 5 lbs in one week to cardiology provider, with minimal prompting -- denies signs/ symptoms concerning for yellow CHF zone -- explained that I had previously placed printed EMMI educational material in the mail to her; patient reports she has not yet received-- encouraged patient to look out for this, and to promptly review once she receives it- she agrees to do so  Patient denies further issues, concerns, or problems today. I provided/ confirmed that patient hasmy direct phone number, the main [name] CM office phone number, and the [name] CM 24-hour nurse advice phone number should issues arise prior to next scheduled [name] Community CM outreach with scheduled phone call next week post-scheduled provider visits.  Encouraged patient to contact me directly if needs, questions, issues, or concerns arise prior to next scheduled outreach; patient agreed to do so.  Plan:  Patient will take medications as prescribed and will attend all scheduled provider appointments  Patient will promptly notify care providers for any new  concerns/ issues/ problems that arise  Patient will continue monitoring/ recording daily weights   [facility] CM outreach to continue with scheduled phone call in 2 weeks  [facility] CM Care Plan Problem One     Most Recent Value  Care Plan Problem One  High risk for hospital readmission secondary to/ as evidenced by recent hospitalization for CHF exacerbation  Role Documenting the Problem One  Care Management Coordinator  Care Plan for Problem One  Active  [facility] Long Term Goal   Over the next 31 days, patient will not experience hospital readmission, as evidenced by patient reporting and review of EMR during [name] Community RN CM outreach  [facility] Long Term Goal Start Date  06/16/18  Interventions for Problem One Long Term Goal  Discussed current clinical condition with patient and confirmed that she has no concerns around her condition,  discussed recent addition of Entresto to her medications and confirmed that patient has obtained and is currently taking as prescribed  [facility] CM Short Term Goal #1   Over the next 30 days, patient will attend all scheduled provider visits, as evidenced by patient reporting and review of EMR/ collaboration with care providers as indicated during [name] RN CCM outreach  [name] CM Short Term Goal #1 Start Date  06/16/18  Interventions for Short Term Goal #1  Reviewed recent and upcoming provider appointments with patient and confirmed that she has reliable transportation and plans to attend all as scheduled  [facility] CM Short Term Goal #2   Over the next 30 days, patient will continue monitoring and recording daily weights at home, as evidenced by review of same with patient during [name] RN CCM outreach  [name] CM Short Term Goal #2 Start Date  06/16/18  Interventions for Short Term Goal #2  Confirmed that patient contiues monitoring and recording daily weights,  reviewed recent weight trends at home and confirmed that patient is able to independently verbalize weight gain guidelines in  setting of CHF,  encouraged patient to promptly review printed EMMI education which was previously mailed to her      Caryl Pina, RN, BSN, [employer] Coordinator [facility] Care Management  [phone]

## 2018-06-26 NOTE — Progress Notes (Signed)
Bp: 118/80, pulse: 87, resp: 18, spo2: 95 bgl: 165  Today first visit, explained the program and she is wanting to part of the program.  She weighs daily and writes it down.  She is supported by spouse and sister.  She is due to have cataracts surgery in January.  She states been feeling good since discharge.  She has been watching her diet and fluids.  She has lost weight lately.  She does not have a meter to check her glucose, advised them to get her PCP to get her one.  Today I checked it.  She is aware of red zones, went over weight gain of 3 lbs overnight and 5 pounds to call.  Discussed the holidays and they have a trip coming up.  Meds verified and she is aware of what meds to take and for what.  Spouse places meds in a med box for her. She is able to afford meds and has transportation to appts.  Will visit weekly to educate on heart failure and discuss diet.    Earmon [place] EMT-Paramedic [phone]

## 2018-06-27 ENCOUNTER — Telehealth (HOSPITAL_COMMUNITY): Payer: Self-pay | Admitting: Cardiology

## 2018-06-27 NOTE — Telephone Encounter (Signed)
NP appt needed per [facility] referral  Per Amy Clegg,NP ok to schedule appt on APP side   Left message on voicemail

## 2018-07-01 ENCOUNTER — Telehealth (HOSPITAL_COMMUNITY): Payer: Self-pay

## 2018-07-01 ENCOUNTER — Telehealth: Payer: Self-pay | Admitting: Family

## 2018-07-01 ENCOUNTER — Other Ambulatory Visit (INDEPENDENT_AMBULATORY_CARE_PROVIDER_SITE_OTHER): Payer: Medicare Other | Admitting: *Deleted

## 2018-07-01 ENCOUNTER — Other Ambulatory Visit: Payer: Medicare Other

## 2018-07-01 DIAGNOSIS — I10 Essential (primary) hypertension: Secondary | ICD-10-CM

## 2018-07-01 DIAGNOSIS — I5022 Chronic systolic (congestive) heart failure: Secondary | ICD-10-CM

## 2018-07-01 DIAGNOSIS — I428 Other cardiomyopathies: Secondary | ICD-10-CM | POA: Diagnosis not present

## 2018-07-01 NOTE — Telephone Encounter (Signed)
Received phone call from [place] (paramedic) that she had received a phone call from patient's spouse regarding patient's symptoms.  Called and spoke to patient's spouse. She says that patient has gained 5 pounds over the last 2 days. Took an extra furosemide last night without any change in her weight today. She says that Ms. Cassels has some edema around her ankles, stable angina that comes and goes and slight worsening of her shortness of breath.   Advised patient's spouse to have patient take an additional 40mg  furosemide today and will see patient in clinic tomorrow at 9:20am.

## 2018-07-01 NOTE — Telephone Encounter (Signed)
Davey's spouse contacted me and advised her weight is up and she has been having some chest pain.  She has experienced same type chest pain in past, comes and goes.  Her weight is up 5 lbs in past 5 days, she took extra lasix last night without change.  Contacted HF clinic at [facility] to see if they could possibly to see Naavah today since she is coming to the hospital for labs and they advised they could not today, since they quit seeing patients at 2:00.  Marchelle Folks advised they could see her at 9:20 in the morning and that if the chest pain is her normal and its just a weight problem she could be seen then. Advised Murrell's spouse and they were good with that, they stated she will take extra lasix today and see Inetta Fermo in the morning.    Earmon [place] EMT-Paramedic [phone]

## 2018-07-02 ENCOUNTER — Encounter: Payer: Self-pay | Admitting: Family

## 2018-07-02 ENCOUNTER — Telehealth (HOSPITAL_COMMUNITY): Payer: Self-pay

## 2018-07-02 ENCOUNTER — Ambulatory Visit: Payer: Medicare Other | Attending: Family | Admitting: Family

## 2018-07-02 VITALS — BP 120/94 | HR 89 | Resp 18 | Ht 65.0 in | Wt 196.5 lb

## 2018-07-02 DIAGNOSIS — Z90710 Acquired absence of both cervix and uterus: Secondary | ICD-10-CM | POA: Insufficient documentation

## 2018-07-02 DIAGNOSIS — Z79899 Other long term (current) drug therapy: Secondary | ICD-10-CM | POA: Insufficient documentation

## 2018-07-02 DIAGNOSIS — I110 Hypertensive heart disease with heart failure: Secondary | ICD-10-CM | POA: Diagnosis not present

## 2018-07-02 DIAGNOSIS — E119 Type 2 diabetes mellitus without complications: Secondary | ICD-10-CM | POA: Insufficient documentation

## 2018-07-02 DIAGNOSIS — F909 Attention-deficit hyperactivity disorder, unspecified type: Secondary | ICD-10-CM | POA: Insufficient documentation

## 2018-07-02 DIAGNOSIS — Z95810 Presence of automatic (implantable) cardiac defibrillator: Secondary | ICD-10-CM | POA: Insufficient documentation

## 2018-07-02 DIAGNOSIS — G2581 Restless legs syndrome: Secondary | ICD-10-CM | POA: Diagnosis not present

## 2018-07-02 DIAGNOSIS — Z87891 Personal history of nicotine dependence: Secondary | ICD-10-CM | POA: Insufficient documentation

## 2018-07-02 DIAGNOSIS — F4310 Post-traumatic stress disorder, unspecified: Secondary | ICD-10-CM | POA: Diagnosis not present

## 2018-07-02 DIAGNOSIS — G4733 Obstructive sleep apnea (adult) (pediatric): Secondary | ICD-10-CM | POA: Diagnosis not present

## 2018-07-02 DIAGNOSIS — I5022 Chronic systolic (congestive) heart failure: Secondary | ICD-10-CM | POA: Insufficient documentation

## 2018-07-02 DIAGNOSIS — Z9851 Tubal ligation status: Secondary | ICD-10-CM | POA: Diagnosis not present

## 2018-07-02 DIAGNOSIS — Z794 Long term (current) use of insulin: Secondary | ICD-10-CM | POA: Insufficient documentation

## 2018-07-02 DIAGNOSIS — Z8249 Family history of ischemic heart disease and other diseases of the circulatory system: Secondary | ICD-10-CM | POA: Diagnosis not present

## 2018-07-02 DIAGNOSIS — I10 Essential (primary) hypertension: Secondary | ICD-10-CM

## 2018-07-02 DIAGNOSIS — Z9889 Other specified postprocedural states: Secondary | ICD-10-CM | POA: Insufficient documentation

## 2018-07-02 DIAGNOSIS — F319 Bipolar disorder, unspecified: Secondary | ICD-10-CM | POA: Insufficient documentation

## 2018-07-02 LAB — BASIC METABOLIC PANEL
BUN/Creatinine Ratio: 14 (ref 9–23)
BUN: 16 mg/dL (ref 6–24)
CALCIUM: 9.4 mg/dL (ref 8.7–10.2)
CHLORIDE: 98 mmol/L (ref 96–106)
CO2: 21 mmol/L (ref 20–29)
Creatinine, Ser: 1.18 mg/dL — ABNORMAL HIGH (ref 0.57–1.00)
GFR calc non Af Amer: 52 mL/min/{1.73_m2} — ABNORMAL LOW (ref >59)
GFR, EST AFRICAN AMERICAN: 60 mL/min/{1.73_m2} (ref >59)
Glucose: 197 mg/dL — ABNORMAL HIGH (ref 65–99)
Potassium: 3.9 mmol/L (ref 3.5–5.2)
Sodium: 137 mmol/L (ref 134–144)

## 2018-07-02 NOTE — Telephone Encounter (Signed)
appt made 1/22

## 2018-07-02 NOTE — Progress Notes (Signed)
Patient ID: Bonnie Ochoa, female    DOB: [birth year], 52 y.o.   MRN: [id number]  HPI  Bonnie Ochoa is a 52 y/o female with a history of obstructive sleep apnea, restless leg syndrome, PTSD, HTN, DM, depression, CAD, cirrhosis, bipolar, asthma, marijuana use and chronic heart failure.   Echo report from 06/08/18 reviewed and showed an EF of 25-30% along with trivial AR, moderate MR, moderate/severe TR and elevated PA pressure of 45-50 mm Hg. Echo report from 07/03/17 reviewed and showed an EF of 20-25%. Echo was done 05/28/16 and showed an EF of 20-25% along with moderate MR/TR and severely elevated PA pressure of 64 mm Hg.    Right/left catheterization done 06/09/18 and showed normal coronary arteries along with reduced cardiac output of 3.04 with cardiac index of 1.56 and mean pulmonary pressure of 52 mm Hg.   Has been admitted twice and to the ED once during October. Admitted twice during the month of September.  Was in the ED 02/14/18 due to gastritis. Abdominal ultrasound negative for stones. Released same day. Was in the ED 02/09/18 due to abdominal pain. Abdominal/pelvic CT were negative. Released same day.   She presents today for an acute visit after paramedic called to report a weight gain with worsening edema and shortness of breath yesterday. She took 2 extra doses of 66m furosemide over the last 2 days and feels better. Has fatigue and cough present. Denies any difficulty sleeping, abdominal distention, palpitations, pedal edema, chest pain, shortness of breath or dizziness. Says that she's lost 5 pounds at home over the last 2 days. Feels quite sleepy today and doesn't know why.   Past Medical History:  Diagnosis Date  . Abdominal pain    a. 05/2018 HIDA scan wnl.  . ADHD   . Arthritis   . Asthma   . Bipolar 1 disorder ([place]   . Chest pain    a. [place]; b. 04/2018 MV: EF 22%, fixed dist ant septal, apical, and inferoapical defects - ? scar vs. attenuation. No  ischemia.  . CHF (congestive heart failure) ([place]   . Cirrhosis of liver ([place]   . Depression   . Diabetes mellitus without complication ([place]   . Diverticulitis   . HFrEF (heart failure with reduced ejection fraction) ([place]    a. 06/2017 Echo: EF 20-25%, diff HK. Mildly dil LA; b. 05/2018 Echo: EF 25-30%, diff HK, Gr2 DD. Triv AI. Mod MR. Mildly reduced RV fxn. Mod-Sev TR. PASP 45-522mg.  . Marland Kitchenypertension   . IBS (irritable bowel syndrome)   . Insomnia   . Migraines   . NICM (nonischemic cardiomyopathy) ([place]   a. EF prev 25%; b. 11/2014 s/p SJM Fortify Assura, single lead AICD (ser# [id number] c. 06/2017 Echo: EF 20-25%; d. 05/2018 Echo: EF 25-30%, diff HK, Gr2 DD; e. 05/2018 Cath: Nl cors. LVEDP 2838m, PCWP 61m70m PA 65/40 (52). CO/CI 3.04/1.56.  . OSA (obstructive sleep apnea)   . PTSD (post-traumatic stress disorder)   . Restless leg syndrome   . Sleep apnea   . Vertigo    Past Surgical History:  Procedure Laterality Date  . ABDOMINAL HYSTERECTOMY    . CARDIAC CATHETERIZATION    . debribalator  2016  . INSERT / REPLACE / REMOVE PACEMAKER    . INSERTION OF ICD    . RIGHT/LEFT HEART CATH AND CORONARY ANGIOGRAPHY N/A 06/09/2018   Procedure: RIGHT/LEFT HEART CATH AND CORONARY ANGIOGRAPHY;  Surgeon: AridWellington Hampshire;  Location: [facility];  Service: Cardiovascular;  Laterality: N/A;  . TONSILLECTOMY    . TONSILLECTOMY    . TONSILLECTOMY    . TUBAL LIGATION  1980  . TUBAL LIGATION     Family History  Problem Relation Age of Onset  . Hypertension Mother   . Hyperlipidemia Mother   . Heart disease Mother   . Hypertension Sister   . Asthma Sister   . Heart disease Sister   . Diabetes Sister   . Cancer Sister   . Alzheimer's disease Maternal Grandfather   . Hyperlipidemia Brother   . Asthma Sister   . Hypertension Sister   . Diabetes Sister    Social History   Tobacco Use  . Smoking status: Former Smoker    Types: Cigarettes  . Smokeless tobacco:  Never Used  . Tobacco comment: quit about 20 years ago   Substance Use Topics  . Alcohol use: Not Currently   Allergies  Allergen Reactions  . Levothyroxine Rash   Prior to Admission medications   Medication Sig Start Date End Date Taking? Authorizing Provider  blood glucose meter kit and supplies KIT Dispense based on patient and insurance preference. Use up to four times daily as directed. (FOR ICD-9 250.00, 250.01). 07/24/17  Yes Johnson, Megan P, DO  carvedilol (COREG) 12.5 MG tablet Take 1 tablet (12.5 mg total) by mouth 2 (two) times daily. 06/03/18 09/01/18 Yes Theora Gianotti, NP  dicyclomine (BENTYL) 20 MG tablet Take 1 tablet (20 mg total) by mouth 3 (three) times daily as needed for spasms. 05/08/18  Yes Johnson, Megan P, DO  famotidine (PEPCID) 40 MG tablet Take 1 tablet (40 mg total) by mouth every evening. 04/25/18 04/25/19 Yes Johnson, Megan P, DO  furosemide (LASIX) 40 MG tablet Take 1 tablet (40 mg total) by mouth daily. 06/24/18  Yes Theora Gianotti, NP  Insulin Glargine (LANTUS SOLOSTAR) 100 UNIT/ML Solostar Pen Inject 20 Units into the skin daily at 10 pm. 04/27/18  Yes Mody, Sital, MD  Insulin Pen Needle 32G X 6 MM MISC 1 each by Does not apply route daily. 04/07/18  Yes Johnson, Megan P, DO  lamoTRIgine (LAMICTAL) 25 MG tablet Take 1 tablet (25 mg total) by mouth 2 (two) times daily. 03/25/18  Yes [name], MD  metoCLOPramide (REGLAN) 10 MG tablet Take 1 tablet (10 mg total) by mouth 3 (three) times daily with meals. 05/01/18 05/01/19 Yes Johnson, Megan P, DO  nitroGLYCERIN (NITROSTAT) 0.4 MG SL tablet Place 1 tablet (0.4 mg total) under the tongue every 5 (five) minutes as needed for chest pain. 05/19/18 05/19/19 Yes [name], MD  ondansetron (ZOFRAN ODT) 4 MG disintegrating tablet Take 1 tablet (4 mg total) by mouth every 8 (eight) hours as needed for nausea or vomiting. 02/14/18  Yes Nance Pear, MD  pantoprazole (PROTONIX) 40 MG tablet Take 1  tablet (40 mg total) by mouth daily. 08/28/17 08/28/18 Yes [name], MD  PARoxetine (PAXIL) 30 MG tablet TAKE 1 TABLET BY MOUTH EVERY DAY 03/14/18  Yes Volney American, PA-C  QUEtiapine (SEROQUEL) 300 MG tablet TAKE 1 TABLET BY MOUTH EVERYDAY AT BEDTIME Patient taking differently: Take 300 mg by mouth at bedtime.  03/14/18  Yes Volney American, PA-C  sacubitril-valsartan (ENTRESTO) 24-26 MG Take 1 tablet by mouth 2 (two) times daily. 06/20/18  Yes Theora Gianotti, NP  sucralfate (CARAFATE) 1 g tablet Take 1 tablet (1 g total) by mouth 4 (four) times daily. 05/01/18  Yes Johnson, Megan P, DO  traMADol (ULTRAM) 50 MG tablet Take 1 tablet (50 mg total) by mouth every 8 (eight) hours as needed for moderate pain. 06/01/18  Yes Henreitta Leber, MD    Review of Systems  Constitutional: Positive for fatigue. Negative for appetite change.  HENT: Positive for congestion. Negative for ear pain, nosebleeds and sore throat.   Eyes: Negative.   Respiratory: Positive for cough. Negative for chest tightness and shortness of breath.   Cardiovascular: Negative for chest pain, palpitations and leg swelling.  Gastrointestinal: Negative for abdominal distention and abdominal pain.  Endocrine: Negative.   Genitourinary: Negative.   Musculoskeletal: Positive for back pain. Negative for arthralgias and neck pain.  Skin: Negative.   Allergic/Immunologic: Negative.   Neurological: Negative for dizziness, light-headedness and headaches.  Hematological: Negative for adenopathy. Does not bruise/bleed easily.  Psychiatric/Behavioral: Negative for dysphoric mood, sleep disturbance (sleeping on 2 pillows) and suicidal ideas. The patient is not nervous/anxious.    Vitals:   07/02/18 0921  BP: (!) 120/94  Pulse: 89  Resp: 18  SpO2: 99%  Weight: 196 lb 8 oz (89.1 kg)  Height: 5' 5"  (1.651 m)   Wt Readings from Last 3 Encounters:  07/02/18 196 lb 8 oz (89.1 kg)  06/23/18 197 lb 3.2 oz (89.4 kg)   06/20/18 196 lb 8 oz (89.1 kg)   Lab Results  Component Value Date   CREATININE 1.18 (H) 07/01/2018   CREATININE 1.07 (H) 06/23/2018   CREATININE 1.14 (H) 06/17/2018    Physical Exam  Constitutional: She is oriented to person, place, and time. She appears well-developed and well-nourished.  HENT:  Head: Normocephalic and atraumatic.  Neck: Normal range of motion. Neck supple. No JVD present.  Cardiovascular: Normal rate and regular rhythm.  Pulmonary/Chest: Effort normal. She has no wheezes. She has no rales.  Abdominal: Soft. She exhibits no distension. There is no tenderness.  Musculoskeletal: She exhibits no edema or tenderness.  Neurological: She is alert and oriented to person, place, and time.  Skin: Skin is warm and dry.  Psychiatric: She has a normal mood and affect. Her behavior is normal. Thought content normal.  Nursing note and vitals reviewed.  Assessment & Plan:  1: Chronic heart failure with reduced ejection fraction- - NYHA class II - euvolemic - weighing daily; reminded to call for an overnight weight gain of >2 pounds or a weekly weight gain of >5 pounds - weight down 2 pounds from last visit 2 weeks ago - took extra furosemide for 2 days due to home weight gain - BMP obtained yesterday - not adding salt to her food and is trying to eat low sodium foods. Continues to read food labels so that she can keep her sodium intake to <2083m daily. - walking 20-30 minutes on treadmill a few days/ week - cardiology has resumed low dose entresto; has had it stopped in the past due to hypotension - appointment scheduled with [facility] on 07/14/18 - saw cardiology (Sharolyn Douglas 06/20/18 - saw EP (Caryl Comes 08/01/17 - BNP 05/28/18 was 1003.0 - has received her flu vaccine for this season already - no marijuana in the last 4 weeks - now participating in paramedicine program - PharmD reconciled medications with patient  2: HTN- - BP ok today - saw PCP (Wynetta Emery 06/23/18 - BMP  07/01/18 reviewed and showed sodium 137, potassium 3.9, creatinine 1.18 and GFR 60 - saw nephrology (Holley Raring recently  Patient did not bring her medications nor a list. Each medication  was verbally reviewed with the patient and she was encouraged to bring the bottles to every visit to confirm accuracy of list.  Will not make a follow-up appointment at this time since she will be establishing care with [facility] in [place]. Advised patient and her spouse that when she is released from [facility], she can call back to make another appointment with [place].

## 2018-07-02 NOTE — Patient Instructions (Signed)
Continue weighing daily and call for an overnight weight gain of > 2 pounds or a weekly weight gain of >5 pounds. 

## 2018-07-02 NOTE — Telephone Encounter (Signed)
Spoke with Bonnie Ochoa's spouse this am and advised she was doing better this morning.  She is back down the 5 pounds and shortness of breath and chest pain has subsided.  She has appt this am with Inetta Fermo and will visit tomorrow at home.   Earmon [place] EMT-Paramedic [phone]

## 2018-07-03 ENCOUNTER — Other Ambulatory Visit (HOSPITAL_COMMUNITY): Payer: Self-pay

## 2018-07-03 NOTE — Progress Notes (Signed)
Bp: 112/84, pulse: 87, resp: 97, todays weight: 196 lbs Bgl: 339  Today Bonnie Ochoa states she feels a lot better,  Chest pain has subsided and weight is down 2 more lbs today.  Her sugar is elevated, advised them to contact her PCP and maybe move her appt up, she has one Dec 12th.  Talked about diet, her spouse cooks for her and watches what she eats.  She drinks mostly water, some juices.  She eats no bread. Talked about holidays coming up.  Talked about how much fluids to drink.  She denies chest pain, shortness of breath, headaches or dizziness.  She has no swelling in extremities and lungs are clear. Meds verified and spouse places meds in box.  Will continue to visit weekly and educate on heart failure.   Earmon [place] EMT-Paramedic [phone]

## 2018-07-04 ENCOUNTER — Ambulatory Visit (INDEPENDENT_AMBULATORY_CARE_PROVIDER_SITE_OTHER): Payer: Medicare Other

## 2018-07-04 ENCOUNTER — Ambulatory Visit: Payer: Medicare Other | Admitting: Nurse Practitioner

## 2018-07-04 ENCOUNTER — Telehealth: Payer: Self-pay

## 2018-07-04 DIAGNOSIS — Z95810 Presence of automatic (implantable) cardiac defibrillator: Secondary | ICD-10-CM

## 2018-07-04 DIAGNOSIS — I5022 Chronic systolic (congestive) heart failure: Secondary | ICD-10-CM | POA: Diagnosis not present

## 2018-07-04 DIAGNOSIS — E1122 Type 2 diabetes mellitus with diabetic chronic kidney disease: Secondary | ICD-10-CM | POA: Diagnosis not present

## 2018-07-04 DIAGNOSIS — D631 Anemia in chronic kidney disease: Secondary | ICD-10-CM | POA: Diagnosis not present

## 2018-07-04 DIAGNOSIS — N183 Chronic kidney disease, stage 3 (moderate): Secondary | ICD-10-CM | POA: Diagnosis not present

## 2018-07-04 NOTE — Telephone Encounter (Signed)
Remote ICM transmission received.  Attempted call to patient regarding ICM remote transmission and no answer.  

## 2018-07-04 NOTE — Progress Notes (Signed)
EPIC Encounter for ICM Monitoring  Patient Name: Bonnie Ochoa is a 52 y.o. female Date: 07/04/2018 Primary Care Physican: Valerie Roys, DO Primary Cardiologist: Sanjuana Letters, NP Electrophysiologist: Faustino Congress Weight: 196lbs (office weight 07/02/2018)       Attempted call to patient and unable to reach.   Transmission reviewed.   Hospitalized 06/07/2018 to 06/13/2018   Thoracic impedance normal after taking extra Furosemide per [name], NP 07/02/2018 note.   Prescribed: Furosemide 40 take 1 tablet a day  Furosemide20 mg 1 tablet (56m total) daily.   Labs: 07/01/2018 Creatinine 1.18, BUN 16, Potassium 3.9, Sodium 137, eGFR 52-60 06/23/2018 Creatinine 1.07, BUN 12, Potassium 3.9, Sodium 140, eGFR 59-68  06/17/2018 Creatinine 1.14, BUN 17, Potassium 4.1, Sodium 136, eGFR 55-63  06/12/2018 Creatinine 1.34, BUN 27, Potassium 3.7, Sodium 135, eGFR 44-51  06/11/2018 Creatinine 1.16, BUN 20, Potassium 3.8, Sodium 138, eGFR 52->60  06/10/2018 Creatinine 1.11, BUN 18, Potassium 3.7, Sodium 137, eGFR 55->60  See set of results for 06/09/2018-06-03-2018 06/01/2018 Creatinine 1.14, BUN 13, Potassium 3.3, Sodium 140, eGFR 54->60 05/31/2018 Creatinine 1.07, BUN 13, Potassium 3.8, Sodium 140, eGFR 58->60  05/30/2018 Creatinine 1.04, BUN 14, Potassium 3.7, Sodium 139, eGFR >60  05/29/2018 Creatinine 0.94, BUN 13, Potassium 3.5, Sodium 138, eGFR >60  05/28/2018 Creatinine 0.95, BUN 15, Potassium 3.7, Sodium 136, eGFR >60  05/27/2018 Creatinine 1.03, BUN 12, Potassium 3.3, Sodium 140, eGFR >60  05/26/2018 Creatinine 0.90, BUN 15, Potassium 3.2, Sodium 137, eGFR >60  05/25/2018 Creatinine 1.16, BUN 19, Potassium 3.3, Sodium 139, eGFR 52->60  05/19/2018 Creatinine 1.05, BUN 15, Potassium 3.5, Sodium 140, eGFR 59->60  05/09/2018 Creatinine 1.23, BUN 17, Potassium 3.1, Sodium 140, eGFR 49-57  05/08/2018 Creatinine 1.24, BUN 15, Potassium 2.8, Sodium 141, eGFR 48-56 05/01/2018  Creatinine 1.88, BUN 39, Potassium 3.3, Sodium 142, eGFR 30-34  04/27/2018 Creatinine 2.26, BUN 63, Potassium 3.1, Sodium 139, eGFR 23-27  04/26/2018 Creatinine 2.76, BUN 80, Potassium 2.3, Sodium 137, eGFR 18-21  04/25/2018 Creatinine 3.70, BUN 88, Potassium 2.4, Sodium 132, eGFR 13-15   Recommendations:  Unable to reach.  Follow-up plan: ICM clinic phone appointment on 08/04/2018.   Office appointment scheduled 07/08/2018 with CIgnacia Bayley PA. And 07/14/2018 with HF clinic (1st visit).     Copy of ICM check sent to Dr. KCaryl Comes   3 month ICM trend: 07/03/2018    1 Year ICM trend:       LRosalene Billings RN 07/04/2018 5:06 PM

## 2018-07-06 ENCOUNTER — Other Ambulatory Visit: Payer: Self-pay | Admitting: Family Medicine

## 2018-07-07 ENCOUNTER — Other Ambulatory Visit: Payer: Self-pay | Admitting: *Deleted

## 2018-07-07 ENCOUNTER — Encounter: Payer: Self-pay | Admitting: *Deleted

## 2018-07-07 DIAGNOSIS — I10 Essential (primary) hypertension: Secondary | ICD-10-CM | POA: Diagnosis not present

## 2018-07-07 DIAGNOSIS — K219 Gastro-esophageal reflux disease without esophagitis: Secondary | ICD-10-CM | POA: Diagnosis not present

## 2018-07-07 DIAGNOSIS — E669 Obesity, unspecified: Secondary | ICD-10-CM | POA: Diagnosis not present

## 2018-07-07 DIAGNOSIS — H2589 Other age-related cataract: Secondary | ICD-10-CM | POA: Diagnosis not present

## 2018-07-07 DIAGNOSIS — G4733 Obstructive sleep apnea (adult) (pediatric): Secondary | ICD-10-CM | POA: Diagnosis not present

## 2018-07-07 DIAGNOSIS — F341 Dysthymic disorder: Secondary | ICD-10-CM | POA: Diagnosis not present

## 2018-07-07 DIAGNOSIS — I509 Heart failure, unspecified: Secondary | ICD-10-CM | POA: Diagnosis not present

## 2018-07-07 NOTE — Patient Outreach (Signed)
[facility] Customer service manager [facility]) Care Management [facility] CM Telephone Outreach PCP office completes Transition of Care follow up post hospital discharge Post-hospital discharge day # 24  07/07/2018   Bonnie Ochoa [birth year] [id number]  11:10 am: Unsuccessful telephone outreach to [facility], 52 year old female referred to [facility] Community CM by inpatient RN CM after recent hospitalization October 26-June 13, 2018 for acute on chronic CHFexacerbation. Patient was discharged home to self care without home health services in place.Patient has history including, but not limited to, CHF/ cardiomyopathy;previous NSTEMI in 2016 with ICD placement;OSA; HTN; DM; Bipolar disorder; cirrhosis.  HIPAA compliant voice mail message left for patient, requesting return call back.  Plan:  Engaged patient-- Will re-attempt [facility] CM telephone outreach later this week if I do not hear back from patient first.  Caryl Pina, RN, BSN, CCRN [employer] Coordinator [facility] Care Management  [phone]

## 2018-07-08 ENCOUNTER — Other Ambulatory Visit (HOSPITAL_COMMUNITY): Payer: Self-pay

## 2018-07-08 ENCOUNTER — Ambulatory Visit (INDEPENDENT_AMBULATORY_CARE_PROVIDER_SITE_OTHER): Payer: Medicare Other | Admitting: Nurse Practitioner

## 2018-07-08 ENCOUNTER — Encounter: Payer: Self-pay | Admitting: Nurse Practitioner

## 2018-07-08 VITALS — BP 116/60 | HR 74 | Ht 65.0 in | Wt 194.2 lb

## 2018-07-08 DIAGNOSIS — E119 Type 2 diabetes mellitus without complications: Secondary | ICD-10-CM

## 2018-07-08 DIAGNOSIS — Z794 Long term (current) use of insulin: Secondary | ICD-10-CM

## 2018-07-08 DIAGNOSIS — N182 Chronic kidney disease, stage 2 (mild): Secondary | ICD-10-CM

## 2018-07-08 DIAGNOSIS — I428 Other cardiomyopathies: Secondary | ICD-10-CM

## 2018-07-08 DIAGNOSIS — I10 Essential (primary) hypertension: Secondary | ICD-10-CM | POA: Diagnosis not present

## 2018-07-08 DIAGNOSIS — I5022 Chronic systolic (congestive) heart failure: Secondary | ICD-10-CM | POA: Diagnosis not present

## 2018-07-08 MED ORDER — SPIRONOLACTONE 25 MG PO TABS: 12.5000 mg | ORAL_TABLET | Freq: Every day | ORAL | 3 refills | Status: DC

## 2018-07-08 NOTE — Patient Instructions (Signed)
Medication Instructions:  Your physician has recommended you make the following change in your medication:  1- START Spironolactone 12.5 mg (0.5 tablet) by mouth once a day.  If you need a refill on your cardiac medications before your next appointment, please call your pharmacy.   Lab work: Your physician recommends that you return for lab work in: 1 WEEK AT THE MEDICAL MALL TO CHECK BMET if they do not draw lab work at your Heart Failure appointment. - Please go to the [facility]. You will check in at the front desk to the right as you walk into the atrium. Valet Parking is offered if needed.    If you have labs (blood work) drawn today and your tests are completely normal, you will receive your results only by: Marland Kitchen MyChart Message (if you have MyChart) OR . A paper copy in the mail If you have any lab test that is abnormal or we need to change your treatment, we will call you to review the results.  Testing/Procedures: none  Follow-Up: At [facility], you and your health needs are our priority.  As part of our continuing mission to provide you with exceptional heart care, we have created designated Provider Care Teams.  These Care Teams include your primary Cardiologist (physician) and Advanced Practice Providers (APPs -  Physician Assistants and Nurse Practitioners) who all work together to provide you with the care you need, when you need it. You will need a follow up appointment in 2 months.  Please call our office 2 months in advance to schedule this appointment.  You may see Julien Nordmann, MD or one of the following Advanced Practice Providers on your designated Care Team:   Nicolasa Ducking, NP Eula Listen, PA-C . Marisue Ivan, PA-C

## 2018-07-08 NOTE — Progress Notes (Signed)
Office Visit    Patient Name: Bonnie Ochoa Date of Encounter: 07/08/2018  Primary Care Provider:  Valerie Roys, DO Primary Cardiologist:  Ida Rogue, MD  Chief Complaint    52 year old female with a history of nonischemic cardiomyopathy, HFrEF, diabetes, hypertension, bipolar disorder, sleep apnea, PTSD, restless legs, and hepatic cirrhosis, who presents for follow-up related to heart failure.  Past Medical History    Past Medical History:  Diagnosis Date  . Abdominal pain    a. 05/2018 HIDA scan wnl.  . ADHD   . Arthritis   . Asthma   . Bipolar 1 disorder ([place])   . Chest pain    a. Hx of cath in [place] - reportedly nl; b. 04/2018 MV: EF 22%, fixed dist ant septal, apical, and inferoapical defects - ? scar vs. attenuation. No ischemia.  . CHF (congestive heart failure) ([place])   . Cirrhosis of liver ([place])   . Depression   . Diabetes mellitus without complication ([place])   . Diverticulitis   . HFrEF (heart failure with reduced ejection fraction) ([place])    a. 06/2017 Echo: EF 20-25%, diff HK. Mildly dil LA; b. 05/2018 Echo: EF 25-30%, diff HK, Gr2 DD. Triv AI. Mod MR. Mildly reduced RV fxn. Mod-Sev TR. PASP 45-54mHg.  .Marland KitchenHypertension   . IBS (irritable bowel syndrome)   . Insomnia   . Migraines   . NICM (nonischemic cardiomyopathy) ([place]    a. EF prev 25%; b. 11/2014 s/p SJM Fortify Assura, single lead AICD (ser# [id number]; c. 06/2017 Echo: EF 20-25%; d. 05/2018 Echo: EF 25-30%, diff HK, Gr2 DD; e. 05/2018 Cath: Nl cors. LVEDP 296mg, PCWP 321m. PA 65/40 (52). CO/CI 3.04/1.56.  . OSA (obstructive sleep apnea)   . PTSD (post-traumatic stress disorder)   . Restless leg syndrome   . Sleep apnea   . Vertigo    Past Surgical History:  Procedure Laterality Date  . ABDOMINAL HYSTERECTOMY    . CARDIAC CATHETERIZATION    . debribalator  2016  . INSERT / REPLACE / REMOVE PACEMAKER    . INSERTION OF ICD    . RIGHT/LEFT HEART CATH AND CORONARY ANGIOGRAPHY N/A 06/09/2018   Procedure: RIGHT/LEFT HEART CATH AND CORONARY ANGIOGRAPHY;  Surgeon: AriWellington HampshireD;  [facility];  Service: Cardiovascular;  Laterality: N/A;  . TONSILLECTOMY    . TONSILLECTOMY    . TONSILLECTOMY    . TUBAL LIGATION  1980  . TUBAL LIGATION      Allergies  Allergies  Allergen Reactions  . Levothyroxine Rash    History of Present Illness    [age]ar old female with the above complex past medical history including nonischemic cardia myopathy with an EF of 20 to 25%, single lead ICD placement, hypertension, diabetes, bipolar disorder, sleep apnea, PTSD, restless leg syndrome, hepatic cirrhosis, and recent bouts of right upper quadrant pain.  She has had multiple hospitalizations over the past 2 months with prerenal azotemia and acute renal failure in September in the setting of nausea, diarrhea, and diuretic therapy.  This responded well to IV fluids and holding of Entresto, Spironolactone, and Lasix.  She was readmitted September 26 with chest pain and minimal troponin elevation.  She was seen by our team and underwent stress testing which was nonischemic.  Beta-blocker was resumed at discharge while Entresto and diuretic therapy was held in the setting of mild renal insufficiency.  She was readmitted October 14 with right upper quadrant pain.  This was initially suspected  to be secondary to cholecystitis however HIDA scan was normal.  Troponins again were minimally elevated.  She was seen in clinic on October 22 and weight had been stable at her home scale.  She noted mild dyspnea on exertion but volume was relatively stable.  She was advised to resume Lasix on a as needed basis with plan for early follow-up and resumption's of Entresto and Spironolactone in the future.  Unfortunately, she was readmitted to [facility] October 26 with chest pain and was found to be volume overloaded.  Troponin was again mildly elevated.  Renal function was stable.  She was diuresed.   Echocardiogram showed an EF of 25 to 30% with diffuse hypokinesis.  Decision was made to pursue right and left heart cardiac catheterization and this showed normal coronary arteries with an LVEDP of 28 mmHg.  Cardiac output was 3.04 with an index of 1.56.  Following additional diuresis, she was discharged home.    I saw her in follow-up on November 8, at which time her weight had been stable at 192 pounds on her home scale.  She was feeling much better and blood pressure had improved.  I took that opportunity to add Entresto back to her regimen.  Follow-up labs a week later showed stable renal function and electrolytes.  We also opted to refer her to heart failure clinic in [place] and she has follow-up on Monday.  Since her last visit, she has continued to feel well.  Her weight is been stable and she denies chest pain, palpitations, dyspnea, PND, orthopnea, dizziness, syncope, edema, or early satiety.  She is tolerating her medications well and reports compliance.  She was seen by primary care on November 11 with a report of loss of vision.  She says currently saw ophthalmology and was diagnosed with a cataract in the right eye.  She is pending cataract surgery next week.  Local anesthesia only will be used.  Home Medications    Prior to Admission medications   Medication Sig Start Date End Date Taking? Authorizing Provider  blood glucose meter kit and supplies KIT Dispense based on patient and insurance preference. Use up to four times daily as directed. (FOR ICD-9 250.00, 250.01). 07/24/17  Yes Johnson, Megan P, DO  carvedilol (COREG) 12.5 MG tablet Take 1 tablet (12.5 mg total) by mouth 2 (two) times daily. 06/03/18 09/01/18 Yes Theora Gianotti, NP  cyclobenzaprine (FLEXERIL) 5 MG tablet Take 5 mg by mouth 3 (three) times daily as needed for muscle spasms.   Yes [provider]  dicyclomine (BENTYL) 20 MG tablet Take 1 tablet (20 mg total) by mouth 3 (three) times daily as needed  for spasms. Patient taking differently: Take 20 mg by mouth 3 (three) times daily as needed for spasms. Abdominal spasms. 05/08/18  Yes Johnson, Megan P, DO  DUREZOL 0.05 % EMUL Place 1 drop into the right eye as directed. 07/07/18  Yes [provider]  famotidine (PEPCID) 40 MG tablet Take 1 tablet (40 mg total) by mouth every evening. 04/25/18 04/25/19 Yes Johnson, Megan P, DO  furosemide (LASIX) 40 MG tablet Take 1 tablet (40 mg total) by mouth daily. 06/24/18  Yes Theora Gianotti, NP  ILEVRO 0.3 % ophthalmic suspension Place 1 drop into the right eye as directed. 07/07/18  Yes [provider]  Insulin Glargine (LANTUS SOLOSTAR) 100 UNIT/ML Solostar Pen Inject 20 Units into the skin daily at 10 pm. 04/27/18  Yes Bettey Costa, MD  Insulin Pen  Needle 32G X 6 MM MISC 1 each by Does not apply route daily. 04/07/18  Yes Johnson, Megan P, DO  lamoTRIgine (LAMICTAL) 25 MG tablet Take 1 tablet (25 mg total) by mouth 2 (two) times daily. 03/25/18  Yes [name], MD  metoCLOPramide (REGLAN) 10 MG tablet Take 1 tablet (10 mg total) by mouth 3 (three) times daily with meals. 05/01/18 05/01/19 Yes Johnson, Megan P, DO  nitroGLYCERIN (NITROSTAT) 0.4 MG SL tablet Place 1 tablet (0.4 mg total) under the tongue every 5 (five) minutes as needed for chest pain. Patient taking differently: Place 0.4 mg under the tongue every 5 (five) minutes as needed for chest pain. Chest pain. 05/19/18 05/19/19 Yes [name], MD  ondansetron (ZOFRAN ODT) 4 MG disintegrating tablet Take 1 tablet (4 mg total) by mouth every 8 (eight) hours as needed for nausea or vomiting. 02/14/18  Yes Nance Pear, MD  pantoprazole (PROTONIX) 40 MG tablet Take 1 tablet (40 mg total) by mouth daily. 08/28/17 08/28/18 Yes Wohl, Darren, MD  PARoxetine (PAXIL) 30 MG tablet TAKE 1 TABLET BY MOUTH EVERY DAY Patient taking differently: Take 30 mg by mouth daily.  03/14/18  Yes Volney American, PA-C  QUEtiapine (SEROQUEL)  300 MG tablet TAKE 1 TABLET BY MOUTH EVERYDAY AT BEDTIME Patient taking differently: Take 300 mg by mouth at bedtime.  03/14/18  Yes Volney American, PA-C  sacubitril-valsartan (ENTRESTO) 24-26 MG Take 1 tablet by mouth 2 (two) times daily. 06/20/18  Yes Theora Gianotti, NP  sucralfate (CARAFATE) 1 g tablet TAKE 1 TABLET (1 G TOTAL) BY MOUTH 4 (FOUR) TIMES DAILY. 07/07/18  Yes Johnson, Megan P, DO  traMADol (ULTRAM) 50 MG tablet Take 1 tablet (50 mg total) by mouth every 8 (eight) hours as needed for moderate pain. 06/01/18  Yes Henreitta Leber, MD    Review of Systems    Doing well since her last visit.  She denies chest pain, palpitations, dyspnea, pnd, orthopnea, n, v, dizziness, syncope, edema, weight gain, or early satiety.  All other systems reviewed and are otherwise negative except as noted above.  Physical Exam    VS:  BP 116/60 (BP Location: Left Arm, Patient Position: Sitting, Cuff Size: Normal)   Pulse 74   Ht 5' 5"  (1.651 m)   Wt 194 lb 4 oz (88.1 kg)   BMI 32.32 kg/m  , BMI Body mass index is 32.32 kg/m. GEN: Well nourished, well developed, in no acute distress. HEENT: normal. Neck: Supple, no JVD, carotid bruits, or masses. Cardiac: RRR, no murmurs, rubs, or gallops. No clubbing, cyanosis, edema.  Radials/DP/PT 2+ and equal bilaterally.  Respiratory:  Respirations regular and unlabored, clear to auscultation bilaterally. GI: Soft, nontender, nondistended, BS + x 4. MS: no deformity or atrophy. Skin: warm and dry, no rash. Neuro:  Strength and sensation are intact. Psych: Normal affect.  Accessory Clinical Findings    Lab Results  Component Value Date   CREATININE 1.18 (H) 07/01/2018   BUN 16 07/01/2018   NA 137 07/01/2018   K 3.9 07/01/2018   CL 98 07/01/2018   CO2 21 07/01/2018    Assessment & Plan    1.  HFrEF/nonischemic cardiomyopathy: Multiple hospitalizations in September and October, initially in the setting of prerenal azotemia  resulting in discontinuation of heart failure medications and diuretics.  More recently, she was admitted October 26 with chest pain and mild troponin elevation and was noted to be mildly volume overloaded.  Echo at that time  showed an EF of 25 to 30%.  Right and left heart cath showed normal coronary arteries with elevated LVEDP, pulmonary hypertension, and low output of 3.04 with an index of 1.56.  At her last visit on November 8, I added Entresto and she had stable labs in between that visit and this 1.  She has also had significant improvement in exercise tolerance and is euvolemic today.  She has been compliant with medications including carvedilol, Lasix, and Entresto.  She seemed to do much much better when on evidence-based therapy.  Blood pressure stable today and I am going to add back Spironolactone 12.5 mg daily.  She had been on 25 mg daily prior to her admission in September.  She will need a follow-up basic metabolic panel in 1 week and this can either be carried out when seen in heart failure clinic 12/2, or she can come back here and have it done at [facility] next week if for some reason it cannot be drawn there.  2.  Essential hypertension: Blood pressure stable on beta-blocker, Entresto, and diuretic therapy.  Adding low-dose Spironolactone in the setting of above.  3.  Stage II-III chronic kidney disease: Creatinine was stable at 1.18 on her last evaluation November 19.  She will need follow-up basic metabolic panel in 1 week.  She has also been seen by nephrology.  4.  Type 2 diabetes mellitus: A1c was 8.1 on September 26.  She is followed closely by primary care and is on Lantus.  We will have a low threshold to add an SGLT2 inhibitor in the future if not cost prohibitive.  5.  Disposition: Patient has follow-up in advanced heart failure clinic in [place] next Monday.  She will need a follow-up basic metabolic panel 1 week which we have placed an order for and cannot be performed here if  labs are not checked in heart failure clinic.  Murray Hodgkins, NP 07/08/2018, 4:46 PM

## 2018-07-08 NOTE — Progress Notes (Signed)
Today's was visit with Brion Aliment at cardiology appt.  He states everything is looking good, labs, etc.  He is starting back spironolactone at 12.5 mg, she is aware.  Needs labs in 1 week, she has appt with HF in [place] next week and they can do labs, if not she will have to get labs done here.  She states feeling good lately.  She has cateracts surgery next week, Brion Aliment signed off on it.  She visits with GI doctor tomorrow.  Will continue to visit weekly to educate on heart failure and diet.  Discussed holiday and diet.   Earmon [place] EMT-Paramedic [phone]

## 2018-07-09 ENCOUNTER — Encounter: Payer: Self-pay | Admitting: *Deleted

## 2018-07-09 ENCOUNTER — Ambulatory Visit: Payer: Medicare Other | Admitting: Gastroenterology

## 2018-07-09 NOTE — Pre-Procedure Instructions (Addendum)
LM FOR EYE CENTER. MI 10 [phone]  AND CHF. CAN NOT HAVE CATARACT SURGERY FOR 6 MO POST MI ALSO LM FOR SURGERY COORDINATOR

## 2018-07-11 ENCOUNTER — Other Ambulatory Visit: Payer: Self-pay | Admitting: *Deleted

## 2018-07-11 ENCOUNTER — Encounter: Payer: Self-pay | Admitting: *Deleted

## 2018-07-11 NOTE — Patient Outreach (Signed)
[facility] Customer service manager [facility]) Care Management [facility] CM Telephone Outreach PCP office completes Transition of Care follow up post-hospital discharge Post-hospital discharge day # 28 Unsuccessful call attempt # 2  07/11/2018  Bonnie Ochoa [birth year] [id number]  12:20 pm: Unsuccessful telephone outreach to [facility], 52 year old female referred to [facility] Community CM by inpatient RN CM after recent hospitalization October 26-June 13, 2018 for acute on chronic CHFexacerbation. Patient was discharged home to self care without home health services in place.Patient has history including, but not limited to, CHF/ cardiomyopathy;previous NSTEMI in 2016 with ICD placement;OSA; HTN; DM; Bipolar disorder; cirrhosis.   HIPAA compliant voice mail message left for patient, requesting return call back.  Plan:  Will place [facility] Community CM unsuccessful patient outreach letter in mail requesting call back in writing, as this is the second consecutive unsuccessful call attempt without patient call back, in previously engaged patient  Will re-attempt [facility] Community CM telephone outreach again next week if I do not hear back from patient first.  Caryl Pina, RN, BSN, CCRN [employer] Coordinator [facility] Care Management  [phone]

## 2018-07-14 ENCOUNTER — Other Ambulatory Visit: Payer: Self-pay

## 2018-07-14 ENCOUNTER — Encounter (HOSPITAL_COMMUNITY): Payer: Self-pay

## 2018-07-14 ENCOUNTER — Telehealth (HOSPITAL_COMMUNITY): Payer: Self-pay | Admitting: Cardiology

## 2018-07-14 ENCOUNTER — Ambulatory Visit (HOSPITAL_COMMUNITY)
Admission: RE | Admit: 2018-07-14 | Discharge: 2018-07-14 | Disposition: A | Discharge status: Home / Self Care | Payer: Medicare Other | Source: Ambulatory Visit | Attending: Internal Medicine | Admitting: Internal Medicine

## 2018-07-14 VITALS — BP 130/86 | HR 82 | Wt 199.6 lb

## 2018-07-14 DIAGNOSIS — Z87891 Personal history of nicotine dependence: Secondary | ICD-10-CM | POA: Diagnosis not present

## 2018-07-14 DIAGNOSIS — I5022 Chronic systolic (congestive) heart failure: Secondary | ICD-10-CM

## 2018-07-14 DIAGNOSIS — N182 Chronic kidney disease, stage 2 (mild): Secondary | ICD-10-CM | POA: Diagnosis not present

## 2018-07-14 DIAGNOSIS — F319 Bipolar disorder, unspecified: Secondary | ICD-10-CM | POA: Insufficient documentation

## 2018-07-14 DIAGNOSIS — F909 Attention-deficit hyperactivity disorder, unspecified type: Secondary | ICD-10-CM | POA: Insufficient documentation

## 2018-07-14 DIAGNOSIS — E669 Obesity, unspecified: Secondary | ICD-10-CM | POA: Diagnosis not present

## 2018-07-14 DIAGNOSIS — J45909 Unspecified asthma, uncomplicated: Secondary | ICD-10-CM | POA: Insufficient documentation

## 2018-07-14 DIAGNOSIS — F4310 Post-traumatic stress disorder, unspecified: Secondary | ICD-10-CM | POA: Insufficient documentation

## 2018-07-14 DIAGNOSIS — E1122 Type 2 diabetes mellitus with diabetic chronic kidney disease: Secondary | ICD-10-CM | POA: Insufficient documentation

## 2018-07-14 DIAGNOSIS — I2510 Atherosclerotic heart disease of native coronary artery without angina pectoris: Secondary | ICD-10-CM | POA: Insufficient documentation

## 2018-07-14 DIAGNOSIS — Z95810 Presence of automatic (implantable) cardiac defibrillator: Secondary | ICD-10-CM | POA: Insufficient documentation

## 2018-07-14 DIAGNOSIS — Z794 Long term (current) use of insulin: Secondary | ICD-10-CM | POA: Diagnosis not present

## 2018-07-14 DIAGNOSIS — G4700 Insomnia, unspecified: Secondary | ICD-10-CM | POA: Insufficient documentation

## 2018-07-14 DIAGNOSIS — G2581 Restless legs syndrome: Secondary | ICD-10-CM | POA: Insufficient documentation

## 2018-07-14 DIAGNOSIS — K7460 Unspecified cirrhosis of liver: Secondary | ICD-10-CM | POA: Diagnosis not present

## 2018-07-14 DIAGNOSIS — Z6833 Body mass index (BMI) 33.0-33.9, adult: Secondary | ICD-10-CM | POA: Insufficient documentation

## 2018-07-14 DIAGNOSIS — I428 Other cardiomyopathies: Secondary | ICD-10-CM | POA: Insufficient documentation

## 2018-07-14 DIAGNOSIS — Z79899 Other long term (current) drug therapy: Secondary | ICD-10-CM | POA: Insufficient documentation

## 2018-07-14 DIAGNOSIS — G4733 Obstructive sleep apnea (adult) (pediatric): Secondary | ICD-10-CM | POA: Diagnosis not present

## 2018-07-14 DIAGNOSIS — I5042 Chronic combined systolic (congestive) and diastolic (congestive) heart failure: Secondary | ICD-10-CM | POA: Diagnosis not present

## 2018-07-14 DIAGNOSIS — I10 Essential (primary) hypertension: Secondary | ICD-10-CM | POA: Diagnosis not present

## 2018-07-14 DIAGNOSIS — E042 Nontoxic multinodular goiter: Secondary | ICD-10-CM | POA: Diagnosis not present

## 2018-07-14 DIAGNOSIS — K589 Irritable bowel syndrome without diarrhea: Secondary | ICD-10-CM | POA: Insufficient documentation

## 2018-07-14 DIAGNOSIS — E039 Hypothyroidism, unspecified: Secondary | ICD-10-CM | POA: Insufficient documentation

## 2018-07-14 DIAGNOSIS — I130 Hypertensive heart and chronic kidney disease with heart failure and stage 1 through stage 4 chronic kidney disease, or unspecified chronic kidney disease: Secondary | ICD-10-CM | POA: Diagnosis not present

## 2018-07-14 LAB — BASIC METABOLIC PANEL
Anion gap: 8 (ref 5–15)
BUN: 13 mg/dL (ref 6–20)
CHLORIDE: 103 mmol/L (ref 98–111)
CO2: 27 mmol/L (ref 22–32)
CREATININE: 1.07 mg/dL — AB (ref 0.44–1.00)
Calcium: 9 mg/dL (ref 8.9–10.3)
GFR calc Af Amer: >60 mL/min (ref >60)
GFR calc non Af Amer: 59 mL/min — ABNORMAL LOW (ref >60)
GLUCOSE: 147 mg/dL — AB (ref 70–99)
Potassium: 3.1 mmol/L — ABNORMAL LOW (ref 3.5–5.1)
Sodium: 138 mmol/L (ref 135–145)

## 2018-07-14 MED ORDER — SPIRONOLACTONE 25 MG PO TABS: 25.0000 mg | ORAL_TABLET | Freq: Every day | ORAL | 3 refills | Status: DC

## 2018-07-14 MED ORDER — DIGOXIN 125 MCG PO TABS: 0.1250 mg | ORAL_TABLET | Freq: Every day | ORAL | 3 refills | Status: DC

## 2018-07-14 MED ORDER — POTASSIUM CHLORIDE CRYS ER 20 MEQ PO TBCR: 20.0000 meq | EXTENDED_RELEASE_TABLET | Freq: Every day | ORAL | 3 refills | Status: DC

## 2018-07-14 NOTE — Patient Instructions (Addendum)
INCREASE Spironolactone to 25 mg, one tab daily START Digoxin 0.125 mg, one tab daily  Labs today We will only contact you if something comes back abnormal or we need to make some changes. Otherwise no news is good news!  Labs needed in 7-10 days  Your physician has recommended that you have a cardiopulmonary stress test (CPX). CPX testing is a non-invasive measurement of heart and lung function. It replaces a traditional treadmill stress test. This type of test provides a tremendous amount of information that relates not only to your present condition but also for future outcomes. This test combines measurements of you ventilation, respiratory gas exchange in the lungs, electrocardiogram (EKG), blood pressure and physical response before, during, and following an exercise protocol.   You have been referred for a Cardiomems Device  Please review the educational material we have provided to you  We will submit the application to your insurance company, this process can take 4-8 weeks  Once it is approved we will call you to schedule the procedure   Once it is implanted you can expect:   A weekly call from our office about your readings for the first 3 weeks after implant  After that we will just call you periodically    A follow up appointment with our office about 3 weeks after implant   A monthly bill from our office   **If you have questions about this process or the device please Meredith Staggers, RN at [phone]   Your physician recommends that you schedule a follow-up appointment in: 3 week  in the Advanced Practitioners (PA/NP) Clinic   Do the following things EVERYDAY: 1) Weigh yourself in the morning before breakfast. Write it down and keep it in a log. 2) Take your medicines as prescribed 3) Eat low salt foods-Limit salt (sodium) to 2000 mg per day.  4) Stay as active as you can everyday 5) Limit all fluids for the day to less than 2 liters    At the Advanced  Heart Failure Clinic, you and your health needs are our priority. As part of our continuing mission to provide you with exceptional heart care, we have created designated Provider Care Teams. These Care Teams include your primary Cardiologist (physician) and Advanced Practice Providers (APPs- Physician Assistants and Nurse Practitioners) who all work together to provide you with the care you need, when you need it.   You may see any of the following providers on your designated Care Team at your next follow up: Marland Kitchen Dr Arvilla Meres . Dr Marca Ancona . Tonye Becket, NP . Duwaine Maxin, NP . Joanell Rising

## 2018-07-14 NOTE — Telephone Encounter (Signed)
-----   Message from Sherald Hess, NP sent at 07/14/2018  3:26 PM EST ----- Potassium low. Spironolactone increased today to 25 mg daily. Add 20 meq potassium daily. Repeat BMET next week.

## 2018-07-14 NOTE — Progress Notes (Signed)
ADVANCED HF CLINIC CONSULT NOTE  Referring Physician: Carmela Rima NP  Primary Care: Dr Rosita Kea Primary Cardiologist: Dr Rockey Situ  Nephrology: Dr Billey Chang  HPI: Bonnie Ochoa is being referred to the [facility] at the request of [name] NP for heart failure consultation.   Bonnie Strength is a 52 year old with a history of NICM, chronic systolic heart failure,St Jude single chamber ICD,  DMII, HTN, bipolar disorder, OSA, PTSD, depression, cataract right eye, thyroid nodules, former heavy smoker, and cirrhosis. Initially diagnosed with HF in 2008 while living in [place]. In 2017 she relocated to [place] to be closer to her sister.   She has had multiple hospital admissions and ED visits at [facility] over the last 3 months for a/c systolic heart failure.    Admitted to [facility] in June 07, 2018 with chest pain and shortness of breath. Diuresed with IV lasix and later underwent RHC/LHC. She has elevated filling pressures and lowe cardiac index was 1.5. Since discharge entresto was started on 06/20/2018.   She has been followed closely at [facility] in [place] and was last seen 07/08/2018. Low dose spironolactone was started. Weight at that time was 194 pounds.   Overall feeling fine. SOB with steps. Denies PND/Orthopnea. Denies chest pain. Says she gains fluid in her abdomen. Not doing much at home. Appetite ok. No fever or chills. Weight at home 196-198  pounds. Taking all medications. Lives with her wife and sister. Followed by HF Paramedicine in [facility].   CorVue : Impedance trending down, no SVT  Cardiac Test 05/2018 RHC/LHC   Normal coronary arteries. PA 65/40 Mean 52  PCWP 32 PVR 6.6 CO 3.04 CI 1.56.   ECHO 06/08/2018  Left ventricle: The cavity size was severely dilated. Wall   thickness was at the upper limits of normal. Systolic function   was severely reduced. The estimated ejection fraction was in the   range of 25% to 30%. Diffuse hypokinesis. Features are  consistent   with a pseudonormal left ventricular filling pattern, with   concomitant abnormal relaxation and increased filling pressure   (grade 2 diastolic dysfunction). Doppler parameters are   consistent with high ventricular filling pressure. - Aortic valve: Trileaflet; mildly thickened, mildly calcified   leaflets. There was trivial regurgitation. - Mitral valve: There was at least moderate regurgitation. - Left atrium: The atrium was mildly dilated. - Right ventricle: The cavity size was mildly dilated. Wall   thickness was normal. Systolic function was mildly reduced. - Right atrium: The atrium was mildly dilated. - Tricuspid valve: There was moderate-severe regurgitation. - Pulmonary arteries: Systolic pressure was moderately increased,   in the range of 45 mm Hg to 50 mm Hg. - Inferior vena cava: The vessel was dilated. The respirophasic   diameter changes were blunted (< 50%), consistent with elevated   central venous pressure. - Pericardium, extracardiac: A small pericardial effusion was   identified.   Review of Systems: [y] = yes, _0  = no   General: Weight gain _1 ; Weight loss _2 ; Anorexia _3 ; Fatigue _4 ; Fever _5 ; Chills _6 ; Weakness [Y ]  Cardiac: Chest pain/pressure _7 ; Resting SOB _8 ; Exertional SOB [ Y]; Orthopnea _9 ; Pedal Edema [Y ]; Palpitations _10 ; Syncope _11 ; Presyncope _12 ; Paroxysmal nocturnal dyspnea_13   Pulmonary: Cough _14 ; Wheezing_15 ; Hemoptysis_16 ; Sputum _17 ; Snoring _18   GI: Vomiting_19 ; Dysphagia_20 ; Melena_21 ; Hematochezia _22 ;  Heartburn_0 ; Abdominal pain _1 ; Constipation _2 ; Diarrhea _3 ; BRBPR _4   GU: Hematuria_5 ; Dysuria _6 ; Nocturia_7   Vascular: Pain in legs with walking _8 ; Pain in feet with lying flat _9 ; Non-healing sores _10 ; Stroke _11 ; TIA _12 ; Slurred speech _13 ;  Neuro: Headaches_14 ; Vertigo_15 ; Seizures_16 ; Paresthesias_17 ;Blurred vision _18 ; Diplopia _19 ; Vision changes _20   Ortho/Skin: Arthritis _21 ; Joint pain [Y  ]; Muscle pain _22 ; Joint swelling _23 ; Back Pain [ Y]; Rash _24   Psych: Depression_25 ; Anxiety_26   Heme: Bleeding problems _27 ; Clotting disorders _28 ; Anemia _29   Endocrine: Diabetes [Y ]; Thyroid dysfunction[Y ]   Past Medical History:  Diagnosis Date  . Abdominal pain    a. 05/2018 HIDA scan wnl.  . ADHD   . Anemia   . Arthritis   . Asthma   . Bipolar 1 disorder ([place])   . Chest pain    a. Hx of cath in [place] - reportedly nl; b. 04/2018 MV: EF 22%, fixed dist ant septal, apical, and inferoapical defects - ? scar vs. attenuation. No ischemia.  . CHF (congestive heart failure) ([place])   . Cirrhosis of liver ([place])   . Coronary artery disease   . Depression   . Diabetes mellitus without complication ([place])   . Diverticulitis   . Dysrhythmia   . Heart murmur   . HFrEF (heart failure with reduced ejection fraction) ([place])    a. 06/2017 Echo: EF 20-25%, diff HK. Mildly dil LA; b. 05/2018 Echo: EF 25-30%, diff HK, Gr2 DD. Triv AI. Mod MR. Mildly reduced RV fxn. Mod-Sev TR. PASP 45-50mHg.  .Marland KitchenHypertension   . Hypothyroidism   . IBS (irritable bowel syndrome)   . Insomnia   . Migraines   . NICM (nonischemic cardiomyopathy) ([place]    a. EF prev 25%; b. 11/2014 s/p SJM Fortify [place]ura, single lead AICD (ser# [id number]; c. 06/2017 Echo: EF 20-25%; d. 05/2018 Echo: EF 25-30%, diff HK, Gr2 DD; e. 05/2018 Cath: Nl cors. LVEDP 267mg, PCWP 3261m. PA 65/40 (52). CO/CI 3.04/1.56.  . OSA (obstructive sleep apnea)   . PTSD (post-traumatic stress disorder)   . Restless leg syndrome   . Sleep apnea   . Vertigo     Current Outpatient Medications  Medication Sig Dispense Refill  . carvedilol (COREG) 12.5 MG tablet Take 1 tablet (12.5 mg total) by mouth 2 (two) times daily. 180 tablet 3  . furosemide (LASIX) 40 MG tablet Take 1 tablet (40 mg total) by mouth daily. 30 tablet 6  . sacubitril-valsartan (ENTRESTO) 24-26 MG Take 1 tablet by mouth 2 (two) times daily. 60 tablet 6  . spironolactone (ALDACTONE)  25 MG tablet Take 0.5 tablets (12.5 mg total) by mouth daily. 45 tablet 3  . blood glucose meter kit and supplies KIT Dispense based on patient and insurance preference. Use up to four times daily as directed. (FOR ICD-9 250.00, 250.01). 1 each 0  . cyclobenzaprine (FLEXERIL) 5 MG tablet Take 5 mg by mouth 3 (three) times daily as needed for muscle spasms.    . dMarland Kitchencyclomine (BENTYL) 20 MG tablet Take 1 tablet (20 mg total) by mouth 3 (three) times daily as needed for spasms. (Patient taking differently: Take 20 mg by mouth 3 (three) times daily as needed for spasms. Abdominal spasms.) 90 tablet 2  . DUREZOL 0.05 % EMUL Place 1 drop into the right  eye as directed.    . famotidine (PEPCID) 40 MG tablet Take 1 tablet (40 mg total) by mouth every evening. 90 tablet 1  . ILEVRO 0.3 % ophthalmic suspension Place 1 drop into the right eye as directed.    . Insulin Glargine (LANTUS SOLOSTAR) 100 UNIT/ML Solostar Pen Inject 20 Units into the skin daily at 10 pm. 5 pen 12  . Insulin Pen Needle 32G X 6 MM MISC 1 each by Does not apply route daily. 100 each 12  . lamoTRIgine (LAMICTAL) 25 MG tablet Take 1 tablet (25 mg total) by mouth 2 (two) times daily. 180 tablet 1  . metoCLOPramide (REGLAN) 10 MG tablet Take 1 tablet (10 mg total) by mouth 3 (three) times daily with meals. 90 tablet 1  . nitroGLYCERIN (NITROSTAT) 0.4 MG SL tablet Place 1 tablet (0.4 mg total) under the tongue every 5 (five) minutes as needed for chest pain. (Patient taking differently: Place 0.4 mg under the tongue every 5 (five) minutes as needed for chest pain. Chest pain.) 30 tablet 0  . ondansetron (ZOFRAN ODT) 4 MG disintegrating tablet Take 1 tablet (4 mg total) by mouth every 8 (eight) hours as needed for nausea or vomiting. 20 tablet 0  . pantoprazole (PROTONIX) 40 MG tablet Take 1 tablet (40 mg total) by mouth daily. 30 tablet 11  . PARoxetine (PAXIL) 30 MG tablet TAKE 1 TABLET BY MOUTH EVERY DAY (Patient taking differently: Take 30 mg  by mouth daily. ) 90 tablet 1  . QUEtiapine (SEROQUEL) 300 MG tablet TAKE 1 TABLET BY MOUTH EVERYDAY AT BEDTIME (Patient taking differently: Take 300 mg by mouth at bedtime. ) 90 tablet 1  . sucralfate (CARAFATE) 1 g tablet TAKE 1 TABLET (1 G TOTAL) BY MOUTH 4 (FOUR) TIMES DAILY. 360 tablet 3  . traMADol (ULTRAM) 50 MG tablet Take 1 tablet (50 mg total) by mouth every 8 (eight) hours as needed for moderate pain. 30 tablet 0   No current facility-administered medications for this encounter.     Allergies  Allergen Reactions  . Levothyroxine Rash      Social History   Socioeconomic History  . Marital status: Married    Spouse name: Not on file  . Number of children: 2  . Years of education: Not on file  . Highest education level: High school graduate  Occupational History    Comment: disabled  Social Needs  . Financial resource strain: Not hard at all  . Food insecurity:    Worry: Sometimes true    Inability: Sometimes true  . Transportation needs:    Medical: Yes    Non-medical: Yes  Tobacco Use  . Smoking status: Former Smoker    Types: Cigarettes  . Smokeless tobacco: Never Used  . Tobacco comment: quit about 20 years ago   Substance and Sexual Activity  . Alcohol use: Not Currently  . Drug use: Not Currently    Frequency: 1.0 times per week    Types: Marijuana  . Sexual activity: Yes    Birth control/protection: Surgical    Comment: one partner  Lifestyle  . Physical activity:    Days per week: 0 days    Minutes per session: 0 min  . Stress: Very much  Relationships  . Social connections:    Talks on phone: Never    Gets together: Twice a week    Attends religious service: Never    Active member of club or organization: No    Attends  meetings of clubs or organizations: Never    Relationship status: Married  . Intimate partner violence:    Fear of current or ex partner: No    Emotionally abused: No    Physically abused: No    Forced sexual activity: No   Other Topics Concern  . Not on file  Social History Narrative   Volunteers occasionally       Family History  Problem Relation Age of Onset  . Hypertension Mother   . Hyperlipidemia Mother   . Heart disease Mother   . Hypertension Sister   . Asthma Sister   . Heart disease Sister   . Diabetes Sister   . Cancer Sister   . Alzheimer's disease Maternal Grandfather   . Hyperlipidemia Brother   . Asthma Sister   . Hypertension Sister   . Diabetes Sister     Vitals:   07/14/18 0840  BP: 130/86  Pulse: 82  SpO2: 98%  Weight: 90.5 kg (199 lb 9.6 oz)   Wt Readings from Last 3 Encounters:  07/14/18 90.5 kg (199 lb 9.6 oz)  07/08/18 88.1 kg (194 lb 4 oz)  07/03/18 88.9 kg (196 lb)    PHYSICAL EXAM: General:  Well appearing. No respiratory difficulty HEENT: normal. Thyroid fullness noted Neck: supple. JVP 8-9. Carotids 2+ bilat; no bruits. No lymphadenopathy or thryomegaly appreciated. Cor: PMI nondisplaced. Regular rate & rhythm. No rubs, gallops or murmurs. No S3.   Lungs: clear Abdomen: obese, soft, nontender, nondistended. No hepatosplenomegaly. No bruits or masses. Good bowel sounds. Extremities: no cyanosis, clubbing, rash, edema Neuro: alert & oriented x 3, cranial nerves grossly intact. moves all 4 extremities w/o difficulty. Affect pleasant.  ASSESSMENT & PLAN:  1. Chronic Systolic/Diastolic  Heart Failure , NICM. St Jude single chamber ICD.  05/2018 RHC/LHC cors normal. Cardiac output 3. Cardiac Index 1.56.  - ECHO 06/08/2018 EF 25-30% Grade II DD. - Recent hospitalization for A/C systolic hf. Not sure the root of the problem.  Continue to optimize HF meds.  NYHA IIIb. Volume status mildly elevated. Continue lasix 40 mg daily.  -Increase spironolactone to 25 mg daily  - continue carvedilol 12.5 mg twice a day. May need to cut back but will assess next visit.  - Add digoxin 0.125 mg daily.  -Continue entresto 24/26 mg twice a day  Set up CPX test .  - I  discussed cardiomems and she would like to pursue. Application process started. This may help manage her fluid overload a little better.  2. HTN  Ok. Continue current medications and increase spironolactone. 3. CKD Stage II  Had recent AKI with creatinine >3. Most recent creatinine was back to normal.  Will need to follow renal function closely with medication changes.  Check BMEt today.  4. DMII On insulin per PCP.  5. Obesity  Body mass index is 33.22 kg/m.  6. Thyroid nodules 12/2016 Head/Neck US with thyroid nodules and R and L. Needs yearly [place]. We will need to set up next visit if she hasn't seen her PCP.  Says she has had thyroid biopsy but I don't see results in Epic.    Today she was set up for CPX and cardiomems was set up. Check BMET today and in 7 days. . Consider increasing entresto at that time. Continue HF paramedicine.   Follow up in 3 weeks.   Amy Clegg NP-C  2:54 PM

## 2018-07-14 NOTE — Telephone Encounter (Signed)
Notes recorded by Theresia Bough, CMA on 07/14/2018 at 4:45 PM EST Patient aware. Patient voiced understanding- repeat labs 12/9   ------  Notes recorded by Tonye Becket D, NP on 07/14/2018 at 3:26 PM EST Potassium low. Spironolactone increased today to 25 mg daily. Add 20 meq potassium daily. Repeat BMET next week.

## 2018-07-15 ENCOUNTER — Encounter: Admission: RE | Payer: Self-pay | Source: Ambulatory Visit

## 2018-07-15 ENCOUNTER — Ambulatory Visit: Admission: RE | Admit: 2018-07-15 | Payer: Medicare Other | Source: Ambulatory Visit | Admitting: Ophthalmology

## 2018-07-15 ENCOUNTER — Ambulatory Visit (HOSPITAL_COMMUNITY): Payer: Medicare Other | Attending: Cardiology

## 2018-07-15 DIAGNOSIS — I5022 Chronic systolic (congestive) heart failure: Secondary | ICD-10-CM | POA: Diagnosis not present

## 2018-07-15 HISTORY — DX: Cardiac arrhythmia, unspecified: I49.9

## 2018-07-15 HISTORY — DX: Hypothyroidism, unspecified: E03.9

## 2018-07-15 HISTORY — DX: Cardiac murmur, unspecified: R01.1

## 2018-07-15 HISTORY — DX: Anemia, unspecified: D64.9

## 2018-07-15 SURGERY — PHACOEMULSIFICATION, CATARACT, WITH IOL INSERTION
Anesthesia: Choice | Laterality: Right

## 2018-07-16 ENCOUNTER — Other Ambulatory Visit (HOSPITAL_COMMUNITY): Payer: Self-pay | Admitting: *Deleted

## 2018-07-16 DIAGNOSIS — I5022 Chronic systolic (congestive) heart failure: Secondary | ICD-10-CM | POA: Diagnosis not present

## 2018-07-17 ENCOUNTER — Other Ambulatory Visit: Payer: Self-pay | Admitting: *Deleted

## 2018-07-17 ENCOUNTER — Encounter: Payer: Self-pay | Admitting: *Deleted

## 2018-07-17 NOTE — Patient Outreach (Addendum)
[facility]) [place] Telephone Outreach PCP office completes Transition of Care follow up post-hospital discharge Post-hospital discharge day # 34  07/17/2018  Bonnie Ochoa 1966/07/14 [id number]  Successful telephone outreach to [name], 52 year old female referred to [place] by inpatient RN CM after recent hospitalization October 26-June 13, 2018 for acute on chronic CHFexacerbation. Patient was discharged home to self care without home health services in place.Patient has history including, but not limited to, CHF/ cardiomyopathy;previous NSTEMI in 2016 with ICD placement;OSA; HTN; DM; Bipolar disorder; cirrhosis. HIPAA/ identity verified with patient during phone call today.  Today, patient reports that she is "doing really good," and she denies concerns, pain, new/ recent falls post-hospital discharge and sounds to be in no obvious distress throughout today's phone call.  Patient reports that her previously scheduled cataract removal surgery has been postponed until she becomes more stable after her recent hospitalization.  Patient states this "is fine with" her, and states that she is getting "used to" decreased vision and would rather wait to have the surgery than to "take any chances."   Patient further reports:  -- attended recent Advanced Heart failure clinic office visit 07/14/18: post-office visits instructions were reviewed with patient, including changes to medications- patient reports that she has/ is taking all medications post-office visit and is able to verbalize accurate independent reporting of medication changes/ additions- see medication list in EMR, updated to reflect recent changes.  Patient reports that she is planning to obtain and begin taking recently post-visit prescribed potassium today.  All previous and upcoming provider appointments reviewed with patient today; patient verbalizes accurate understanding  of all upcoming appointments, as follows: ---- 12/09 labs/ CHF ---- 12/10 GI provider ---- 12/12 PCP ---- 12/30 [facility] [place] office  -- reports no concerns, or problems around medications; spouse continues managing patient's medications, patient assists  --has continued to monitor/ recording daily weights;  role of daily weight monitoring/ recording at home was reviewed with patient today. -- reviewed recent weight ranges with patient: 196-197 lbs consistently per patient report, with a weight this morning of 197 lbs -- again able to independently verbalize weight gain guidelines in setting of CHF to promptly report weight gain > 3 lbs overnight/ 5 lbs in one week to cardiology provider -- denies signs/ symptoms concerning for yellow CHF zone; discussed with patient value of following action plan to promptly report any new concerns/ issues/ problems to care providers promptly -- acknowledges that she is being visited by [employer] HF paramedicine weekly- states that she is glad this is available to her; states visits "going well."  Confirms that she has contact information for paramedic following her in community -- denies issues aorund breathing: states "breathing fine;" reports she is waiting on status of new CPAP machine; hopeful that this will be delivered soon -- continues following low sodium diet   Patient denies further issues, concerns, or problems today. We discussed whether patient would like to continue participating in [place] program telephonically, since she now has [facility] HF Paramedicine program in place.  Patient stated she owuld like for me to continue checking in with her by phone "for now," stating that she wants to have all the help she can get 'to stay on track."  Iconfirmed that patient hasmy direct phone number, the main [facility] CM office phone number, and the [facility] CM 24-hour nurse advice phone number should issues arise prior to next scheduled [place]  scheduled phone call in approximately 4 weeks. Encouraged patient to contact me directly if needs, questions, issues, or concerns arise prior to next scheduled outreach; patient agreed to do so.  Plan:  Patient will take medications as prescribed and will attend all scheduled provider appointments  Patient will promptly notify care providers for any new concerns/ issues/ problems that arise  Patient will continue monitoring/ recording daily weights   Patient will continue actively participating in Community HF paramedicine program  I will route today's [facility] CM note and care plan to patient's PCP as initial assessment  [facility] Community CM outreach to continue with scheduled phone call in 4 weeks  [facility] CM Care Plan Problem One     Most Recent Value  Care Plan Problem One  High risk for hospital readmission secondary to/ as evidenced by recent hospitalization for CHF exacerbation  Role Documenting the Problem One  Care Management Coordinator  Care Plan for Problem One  Not Active  [facility] Long Term Goal   Over the next 31 days, patient will not experience hospital readmission, as evidenced by patient reporting and review of EMR during [place] RN CM outreach  [place] Term Goal Start Date  06/16/18  [facility] Long Term Goal Met Date  07/17/18 [facility] met]  Interventions for Problem One Long Term Goal  Discussed with patient current clinical conditon and confirms that she has no current concerns around her condition,  encouraged patient to promptly report any new concerns to care providers  [facility] CM Short Term Goal #1   Over the next 30 days, patient will attend all scheduled provider visits, as evidenced by patient reporting and review of EMR/ collaboration with care providers as indicated during [facility] RN CCM outreach  [facility] CM Short Term Goal #1 Start Date  06/16/18  [facility] CM Short Term Goal #1 Met Date  07/17/18 [Goal Met]  Interventions for Short Term Goal #1  Reviewed recent  provider office visits with patient and confirmed that she is taking and is able to accurately/ independently verbalize new medication instructions,  reviewed upcoming provider appointments with patient and confirmed that she plans to attend and has ongoing reliable transportation to appointments  [facility] CM Short Term Goal #2   Over the next 30 days, patient will continue monitoring and recording daily weights at home, as evidenced by review of same with patient during [place] CCM outreach  [facility] CM Short Term Goal #2 Start Date  06/16/18  [facility] CM Short Term Goal #2 Met Date  07/17/18 [Goal Met]  Interventions for Short Term Goal #2  Confirmed that patient continues monitoring and recording daily weights and reviewed recent weights at home with patient,  reiterated with patient weight gain guidelines in setting of CHF    [facility] CM Care Plan Problem Two     Most Recent Value  Care Plan Problem Two  Ongoing self-health management of chronic disease state of CHF at home, as evidenced by patient reporting   Role Documenting the Problem Two  Care Management [place] for Problem Two  Active  Interventions for Problem Two Long Term Goal   Discussed with patient HF paramedic home visits and confirmed that patient is visited weekly by paramedic and actively participates in home visits with paramedic  [facility] Long Term Goal  Over the next 45 days, patient will continue actively participating in heart failure paramedic home visits, as evidenced by patient reporitng and collaboration with HF paramedic as indicated during [facility]  RN CCM outreach  [facility] Long Term Goal Start Date  07/17/18  [facility] CM Short Term Goal #1   Over the next 30 days, patient will promptly report any new concerns, issues, problems to care providers as evidenced by patient reporting during [facility] RN CCM outreach  [facility] CM Short Term Goal #1 Start Date  07/17/18  Interventions for Short Term Goal #2   Discussed importance of prompt norification of care  providers for new concerns,  discussed signs/ symptoms of CHF yellow zone with corresponding action plan     Oneta Rack, RN, BSN, [place] Coordinator [facility] Care Management  [phone]

## 2018-07-20 ENCOUNTER — Other Ambulatory Visit: Payer: Self-pay | Admitting: Family Medicine

## 2018-07-21 ENCOUNTER — Telehealth (HOSPITAL_COMMUNITY): Payer: Self-pay

## 2018-07-21 ENCOUNTER — Ambulatory Visit (HOSPITAL_COMMUNITY)
Admission: RE | Admit: 2018-07-21 | Discharge: 2018-07-21 | Disposition: A | Discharge status: Home / Self Care | Payer: Medicare Other | Source: Ambulatory Visit | Attending: Cardiology | Admitting: Cardiology

## 2018-07-21 DIAGNOSIS — I5022 Chronic systolic (congestive) heart failure: Secondary | ICD-10-CM | POA: Insufficient documentation

## 2018-07-21 LAB — BASIC METABOLIC PANEL
ANION GAP: 10 (ref 5–15)
BUN: 15 mg/dL (ref 6–20)
CO2: 25 mmol/L (ref 22–32)
Calcium: 9.3 mg/dL (ref 8.9–10.3)
Chloride: 101 mmol/L (ref 98–111)
Creatinine, Ser: 1.14 mg/dL — ABNORMAL HIGH (ref 0.44–1.00)
GFR calc Af Amer: >60 mL/min (ref >60)
GFR calc non Af Amer: 54 mL/min — ABNORMAL LOW (ref >60)
Glucose, Bld: 192 mg/dL — ABNORMAL HIGH (ref 70–99)
POTASSIUM: 4.8 mmol/L (ref 3.5–5.1)
Sodium: 136 mmol/L (ref 135–145)

## 2018-07-21 NOTE — Telephone Encounter (Signed)
Spoke with sister of the pt. She said she would let her sister know to call the HF clinic.

## 2018-07-21 NOTE — Telephone Encounter (Signed)
Spoke with pharmacy (per pts's request) regarding potassium prescription not being filled. They relayed that the pt picked up a 90 day refill on 06/19/18 and isnt eligible for a refill per her insurance.

## 2018-07-22 ENCOUNTER — Encounter: Payer: Self-pay | Admitting: Gastroenterology

## 2018-07-22 ENCOUNTER — Ambulatory Visit (INDEPENDENT_AMBULATORY_CARE_PROVIDER_SITE_OTHER): Payer: Medicare Other | Admitting: Gastroenterology

## 2018-07-22 VITALS — BP 110/76 | HR 89 | Ht 65.0 in | Wt 202.8 lb

## 2018-07-22 DIAGNOSIS — K7469 Other cirrhosis of liver: Secondary | ICD-10-CM

## 2018-07-22 NOTE — Progress Notes (Signed)
[name], MD [address]  [place]  [place], [place] [address]  Main: [phone]  Fax: [phone]   Primary Care Physician: Valerie Roys, DO  Primary Gastroenterologist:  Dr. [name]  Chief complaint: Follow-up for cirrhosis  HPI: Bonnie Ochoa is a 52 y.o. female with history of cirrhosis due to low EF of 25% requiring AICD, here for follow-up.  Denies any abdominal pain.  Had a recent hospitalization for abdominal pain which is completely resolved.  Had extensive work-up during that admission which was essentially negative.  Patient was doing abdominal crunches and working out and etiology abdominal pain was thought to be musculoskeletal.  No altered bowel habits.  No blood in stool.  No episodes of bleeding.  No episodes of confusion.  No abdominal distention.  Patient was scheduled for screening colonoscopy and EGD for variceal screening, but procedures had to be rescheduled due to very recent admission for CHF.  Patient being followed by cardiology with recent changes in medication, and medication optimization.  No family history of GI malignancy.  Current Outpatient Medications  Medication Sig Dispense Refill  . blood glucose meter kit and supplies KIT Dispense based on patient and insurance preference. Use up to four times daily as directed. (FOR ICD-9 250.00, 250.01). 1 each 0  . carvedilol (COREG) 12.5 MG tablet Take 1 tablet (12.5 mg total) by mouth 2 (two) times daily. 180 tablet 3  . cyclobenzaprine (FLEXERIL) 5 MG tablet Take 5 mg by mouth 3 (three) times daily as needed for muscle spasms.    Marland Kitchen dicyclomine (BENTYL) 20 MG tablet Take 1 tablet (20 mg total) by mouth 3 (three) times daily as needed for spasms. (Patient taking differently: Take 20 mg by mouth 3 (three) times daily as needed for spasms. Abdominal spasms.) 90 tablet 2  . digoxin (LANOXIN) 0.125 MG tablet Take 1 tablet (0.125 mg total) by mouth daily. 90 tablet 3  . DUREZOL 0.05  % EMUL Place 1 drop into the right eye as directed.    . famotidine (PEPCID) 40 MG tablet Take 1 tablet (40 mg total) by mouth every evening. 90 tablet 1  . furosemide (LASIX) 40 MG tablet Take 1 tablet (40 mg total) by mouth daily. 30 tablet 6  . ILEVRO 0.3 % ophthalmic suspension Place 1 drop into the right eye as directed.    . Insulin Glargine (LANTUS SOLOSTAR) 100 UNIT/ML Solostar Pen Inject 20 Units into the skin daily at 10 pm. 5 pen 12  . Insulin Pen Needle 32G X 6 MM MISC 1 each by Does not apply route daily. 100 each 12  . lamoTRIgine (LAMICTAL) 25 MG tablet Take 1 tablet (25 mg total) by mouth 2 (two) times daily. 180 tablet 1  . metoCLOPramide (REGLAN) 10 MG tablet Take 1 tablet (10 mg total) by mouth 3 (three) times daily with meals. 90 tablet 1  . nitroGLYCERIN (NITROSTAT) 0.4 MG SL tablet Place 1 tablet (0.4 mg total) under the tongue every 5 (five) minutes as needed for chest pain. (Patient taking differently: Place 0.4 mg under the tongue every 5 (five) minutes as needed for chest pain. Chest pain.) 30 tablet 0  . ondansetron (ZOFRAN ODT) 4 MG disintegrating tablet Take 1 tablet (4 mg total) by mouth every 8 (eight) hours as needed for nausea or vomiting. 20 tablet 0  . pantoprazole (PROTONIX) 40 MG tablet Take 1 tablet (40 mg total) by mouth daily. 30 tablet 11  . PARoxetine (PAXIL) 30 MG  tablet TAKE 1 TABLET BY MOUTH EVERY DAY (Patient taking differently: Take 30 mg by mouth daily. ) 90 tablet 1  . potassium chloride SA (K-DUR,KLOR-CON) 20 MEQ tablet Take 1 tablet (20 mEq total) by mouth daily. 90 tablet 3  . QUEtiapine (SEROQUEL) 300 MG tablet TAKE 1 TABLET BY MOUTH EVERYDAY AT BEDTIME (Patient taking differently: Take 300 mg by mouth at bedtime. ) 90 tablet 1  . sacubitril-valsartan (ENTRESTO) 24-26 MG Take 1 tablet by mouth 2 (two) times daily. 60 tablet 6  . spironolactone (ALDACTONE) 25 MG tablet Take 1 tablet (25 mg total) by mouth daily. 90 tablet 3  . sucralfate (CARAFATE)  1 g tablet TAKE 1 TABLET (1 G TOTAL) BY MOUTH 4 (FOUR) TIMES DAILY. 360 tablet 3  . traMADol (ULTRAM) 50 MG tablet Take 1 tablet (50 mg total) by mouth every 8 (eight) hours as needed for moderate pain. 30 tablet 0   No current facility-administered medications for this visit.     Allergies as of 07/22/2018 - Review Complete 07/17/2018  Allergen Reaction Noted  . Levothyroxine Rash 07/02/2017    ROS:  General: Negative for anorexia, weight loss, fever, chills, fatigue, weakness. ENT: Negative for hoarseness, difficulty swallowing , nasal congestion. CV: Negative for chest pain, angina, palpitations, dyspnea on exertion, peripheral edema.  Respiratory: Negative for dyspnea at rest, dyspnea on exertion, cough, sputum, wheezing.  GI: See history of present illness. GU:  Negative for dysuria, hematuria, urinary incontinence, urinary frequency, nocturnal urination.  Endo: Negative for unusual weight change.    Physical Examination: Vitals:   07/22/18 1648  BP: 110/76  Pulse: 89  Weight: 202 lb 12.8 oz (92 kg)  Height: 5' 5" (1.651 m)    General: Well-nourished, well-developed in no acute distress.  Eyes: No icterus. Conjunctivae pink. Mouth: Oropharyngeal mucosa moist and pink , no lesions erythema or exudate. Neck: Supple, Trachea midline Abdomen: Bowel sounds are normal, nontender, nondistended, no hepatosplenomegaly or masses, no abdominal bruits or hernia , no rebound or guarding.   Extremities: No lower extremity edema. No clubbing or deformities. Neuro: Alert and oriented x 3.  Grossly intact. Skin: Warm and dry, no jaundice.   Psych: Alert and cooperative, normal mood and affect. Cardiac: No lower extremity edema  Labs: CMP     Component Value Date/Time   NA 136 07/21/2018 1329   NA 137 07/01/2018 1415   K 4.8 07/21/2018 1329   CL 101 07/21/2018 1329   CO2 25 07/21/2018 1329   GLUCOSE 192 (H) 07/21/2018 1329   BUN 15 07/21/2018 1329   BUN 16 07/01/2018 1415    CREATININE 1.14 (H) 07/21/2018 1329   CALCIUM 9.3 07/21/2018 1329   PROT 8.1 06/07/2018 0752   PROT 7.9 06/03/2018 1347   ALBUMIN 3.8 06/07/2018 0752   ALBUMIN 4.3 06/03/2018 1347   AST 38 06/07/2018 0752   ALT 125 (H) 06/07/2018 0752   ALKPHOS 103 06/07/2018 0752   BILITOT 1.3 (H) 06/07/2018 0752   BILITOT 1.0 06/03/2018 1347   GFRNONAA 54 (L) 07/21/2018 1329   GFRAA >60 07/21/2018 1329   Lab Results  Component Value Date   WBC 5.4 06/10/2018   HGB 10.3 (L) 06/10/2018   HCT 34.3 (L) 06/10/2018   MCV 98.3 06/10/2018   PLT 310 06/10/2018    Imaging Studies: No results found.  Assessment and Plan:   Bonnie Ochoa is a 52 y.o. y/o female with history of cirrhosis due to low EF here for follow-up  [place] 23 May 2018 labs Liver imaging up-to-date from October 2019 with a CT scan was done  No previous history of GI bleed Patient needs a variceal screening and colorectal cancer screening, however, due to recent heart failure exacerbations requiring medication optimization by cardiology, will schedule procedures for a later date to allow for medical optimization prior to the procedures.  Due to recent CHF exacerbation risk of procedure would outweigh benefits at this time.  I discussed alternative methods of colorectal cancer screening such as stool testing.  We discussed that if the stool test is positive she will need a colonoscopy.  She would rather have the colonoscopy done for screening and instead of stool test.  We will follow her in clinic closely and once medically optimized from CHF standpoint, can schedule EGD and colonoscopy at that time.  If patient has any changes in medical condition in the interim she was asked to notify us and she verbalized understanding.   Dr [name]

## 2018-07-23 ENCOUNTER — Ambulatory Visit: Payer: Medicare Other | Admitting: Psychiatry

## 2018-07-23 ENCOUNTER — Other Ambulatory Visit (HOSPITAL_COMMUNITY): Payer: Self-pay

## 2018-07-23 ENCOUNTER — Encounter (HOSPITAL_COMMUNITY): Payer: Self-pay

## 2018-07-23 NOTE — Progress Notes (Signed)
Bgl: 236 bp: 112/76, pulse: 60, resp: 18, spo2: 97% todays weight: 196 lbs  Today Dmia states she has been feeling good.  They cancelled her eye surgery due to not wanting to put her under anesthesia. She appears to be ok with that, she does not want to take a chance.  Her sugar is elevated, she has appt with her PCP that controls her diabetes.  Reminded her to request a prescription for glucometer so she can check it. She states has some chest pain now and then, not lasting long and not enough to take a nitro.  She denies chest pain today, shortness of breath, headaches or dizziness. She watches what she eats, she states has cheated a little today, she had hash browns and potato chips today. She watches her fluids everyday.  Advised her just because she is feeling good don't slide into eating anything, talked about holiday coming up and her traveling for a week to [place].  Advised her if she has problems to seek medical attention, don't wait.  Meds verified with her wife, she takes care of her meds for her. Will continue to visit weekly to educate on heart failure.   Earmon [place] EMT-Paramedic [phone]

## 2018-07-24 ENCOUNTER — Ambulatory Visit: Payer: Medicare Other | Admitting: Family

## 2018-07-24 ENCOUNTER — Telehealth: Payer: Self-pay | Admitting: Family Medicine

## 2018-07-24 ENCOUNTER — Ambulatory Visit: Payer: Medicare Other | Admitting: Family Medicine

## 2018-07-24 NOTE — Telephone Encounter (Signed)
Bonnie Ochoa presented in office states that she needs a refill on her metoclopramide 10mg  and also needs to have a need machine to check blood sugar and needles to draw blood patient uses [facility] in [place] on [address] rescheduled appointment 07/30/2018

## 2018-07-28 ENCOUNTER — Ambulatory Visit (HOSPITAL_COMMUNITY)
Admission: RE | Admit: 2018-07-28 | Discharge: 2018-07-28 | Disposition: A | Discharge status: Home / Self Care | Payer: Medicare Other | Source: Ambulatory Visit | Attending: Cardiology | Admitting: Cardiology

## 2018-07-28 DIAGNOSIS — I5022 Chronic systolic (congestive) heart failure: Secondary | ICD-10-CM

## 2018-07-28 LAB — BASIC METABOLIC PANEL
Anion gap: 11 (ref 5–15)
BUN: 13 mg/dL (ref 6–20)
CHLORIDE: 102 mmol/L (ref 98–111)
CO2: 23 mmol/L (ref 22–32)
Calcium: 9 mg/dL (ref 8.9–10.3)
Creatinine, Ser: 1.15 mg/dL — ABNORMAL HIGH (ref 0.44–1.00)
GFR calc Af Amer: >60 mL/min (ref >60)
GFR calc non Af Amer: 54 mL/min — ABNORMAL LOW (ref >60)
Glucose, Bld: 307 mg/dL — ABNORMAL HIGH (ref 70–99)
Potassium: 3.6 mmol/L (ref 3.5–5.1)
SODIUM: 136 mmol/L (ref 135–145)

## 2018-07-30 ENCOUNTER — Ambulatory Visit (INDEPENDENT_AMBULATORY_CARE_PROVIDER_SITE_OTHER): Payer: Medicare Other | Admitting: Psychiatry

## 2018-07-30 ENCOUNTER — Other Ambulatory Visit: Payer: Self-pay

## 2018-07-30 ENCOUNTER — Encounter: Payer: Self-pay | Admitting: Psychiatry

## 2018-07-30 ENCOUNTER — Encounter: Payer: Self-pay | Admitting: Family Medicine

## 2018-07-30 ENCOUNTER — Ambulatory Visit (INDEPENDENT_AMBULATORY_CARE_PROVIDER_SITE_OTHER): Payer: Medicare Other | Admitting: Family Medicine

## 2018-07-30 VITALS — BP 136/85 | HR 81 | Temp 98.0°F | Wt 201.4 lb

## 2018-07-30 VITALS — BP 139/93 | HR 81 | Temp 98.4°F | Ht 65.0 in | Wt 198.4 lb

## 2018-07-30 DIAGNOSIS — E1122 Type 2 diabetes mellitus with diabetic chronic kidney disease: Secondary | ICD-10-CM | POA: Diagnosis not present

## 2018-07-30 DIAGNOSIS — E042 Nontoxic multinodular goiter: Secondary | ICD-10-CM | POA: Diagnosis not present

## 2018-07-30 DIAGNOSIS — F3175 Bipolar disorder, in partial remission, most recent episode depressed: Secondary | ICD-10-CM | POA: Diagnosis not present

## 2018-07-30 DIAGNOSIS — N289 Disorder of kidney and ureter, unspecified: Secondary | ICD-10-CM

## 2018-07-30 DIAGNOSIS — F410 Panic disorder [episodic paroxysmal anxiety] without agoraphobia: Secondary | ICD-10-CM | POA: Diagnosis not present

## 2018-07-30 DIAGNOSIS — F1221 Cannabis dependence, in remission: Secondary | ICD-10-CM | POA: Diagnosis not present

## 2018-07-30 DIAGNOSIS — F4312 Post-traumatic stress disorder, chronic: Secondary | ICD-10-CM

## 2018-07-30 DIAGNOSIS — Z1211 Encounter for screening for malignant neoplasm of colon: Secondary | ICD-10-CM | POA: Diagnosis not present

## 2018-07-30 DIAGNOSIS — I5022 Chronic systolic (congestive) heart failure: Secondary | ICD-10-CM

## 2018-07-30 DIAGNOSIS — Z794 Long term (current) use of insulin: Secondary | ICD-10-CM | POA: Diagnosis not present

## 2018-07-30 DIAGNOSIS — N183 Chronic kidney disease, stage 3 (moderate): Secondary | ICD-10-CM | POA: Diagnosis not present

## 2018-07-30 LAB — BAYER DCA HB A1C WAIVED: HB A1C (BAYER DCA - WAIVED): 7.5 % — ABNORMAL HIGH (ref <7.0)

## 2018-07-30 MED ORDER — METOCLOPRAMIDE HCL 10 MG PO TABS: 10.0000 mg | ORAL_TABLET | Freq: Three times a day (TID) | ORAL | 1 refills | Status: DC

## 2018-07-30 MED ORDER — LAMOTRIGINE 25 MG PO TABS: 25.0000 mg | ORAL_TABLET | Freq: Two times a day (BID) | ORAL | 1 refills | Status: DC

## 2018-07-30 MED ORDER — QUETIAPINE FUMARATE 300 MG PO TABS: 300.0000 mg | ORAL_TABLET | Freq: Every day | ORAL | 1 refills | Status: DC

## 2018-07-30 MED ORDER — PAROXETINE HCL 30 MG PO TABS: 30.0000 mg | ORAL_TABLET | Freq: Every day | ORAL | 1 refills | Status: DC

## 2018-07-30 MED ORDER — EMPAGLIFLOZIN 25 MG PO TABS: 25.0000 mg | ORAL_TABLET | Freq: Every day | ORAL | 3 refills | Status: DC

## 2018-07-30 NOTE — Assessment & Plan Note (Signed)
Had her repeat thyroid ultrasound done in September. Doesn't want to see endocrine just yet- too many appointments. Will discuss again in the new year.

## 2018-07-30 NOTE — Assessment & Plan Note (Signed)
Better than last visit with A1c of 7.5- will start jardiance and continue metformin. Elevated sugar 2 days ago due to not taking her medicine. Advised her to take her medicine daily.

## 2018-07-30 NOTE — Telephone Encounter (Signed)
[place] appointment has been cancelled per Dr. Laural Benes. Pt notified of the appt cancellation.

## 2018-07-30 NOTE — Progress Notes (Signed)
BP (!) 139/93 (BP Location: Left Arm, Patient Position: Sitting, Cuff Size: Normal)   Pulse 81   Temp 98.4 F (36.9 C)   Ht 5\' 5"  (1.651 m)   Wt 198 lb 6 oz (90 kg)   SpO2 98%   BMI 33.01 kg/m    Subjective:    Patient ID: Bonnie Ochoa, female    DOB: 12/04/1965, 52 y.o.   MRN: [id number]  HPI: Bonnie Ochoa is a 52 y.o. female  No chief complaint on file.  Saw cardiology and started on spironalactone. Saw advanced CHF clinic 07/14/18 in [place]- they increased her spironalactone. Has been seeing nephro and ophthalmology. To have cataract surgery. She has been followed by the telephone paramedicine to try to help manage her CHF. Saw GI on 07/22/18- she was supposed to have colonoscopy and EGD for variceal screening, but it was postponed due to her recent CHF exacerbation and hospitalization. They recommended cologuard due to illness. Has been having her kidney function followed by CHF clinic- last time, it was back to baseline, but her sugar was 307- up from 192 1 week prior. She notes that she had not taken her lantus that day as she hadn't eaten. She is going to have her cataract surgery in April  DIABETES Hypoglycemic episodes:no Polydipsia/polyuria: yes Visual disturbance: yes Chest pain: no Paresthesias: no Glucose Monitoring: no  Accucheck frequency: Not Checking Taking Insulin?: yes Blood Pressure Monitoring: not checking Retinal Examination: Up to Date Foot Exam: Up to Date Diabetic Education: Completed Pneumovax: Up to Date Influenza: Up to Date Aspirin: no   Relevant past medical, surgical, family and social history reviewed and updated as indicated. Interim medical history since our last visit reviewed. Allergies and medications reviewed and updated.  Review of Systems  Constitutional: Negative.   Respiratory: Negative.   Cardiovascular: Negative.   Gastrointestinal: Negative.   Musculoskeletal: Negative.   Psychiatric/Behavioral: Negative.     Per  HPI unless specifically indicated above     Objective:    BP (!) 139/93 (BP Location: Left Arm, Patient Position: Sitting, Cuff Size: Normal)   Pulse 81   Temp 98.4 F (36.9 C)   Ht 5\' 5"  (1.651 m)   Wt 198 lb 6 oz (90 kg)   SpO2 98%   BMI 33.01 kg/m   Wt Readings from Last 3 Encounters:  07/30/18 198 lb 6 oz (90 kg)  07/23/18 196 lb (88.9 kg)  07/22/18 202 lb 12.8 oz (92 kg)    Physical Exam Vitals signs and nursing note reviewed.  Constitutional:      General: She is not in acute distress.    Appearance: Normal appearance. She is not ill-appearing, toxic-appearing or diaphoretic.  HENT:     Head: Normocephalic and atraumatic.     Right Ear: External ear normal.     Left Ear: External ear normal.     Nose: Nose normal.     Mouth/Throat:     Mouth: Mucous membranes are moist.     Pharynx: Oropharynx is clear.  Eyes:     General: No scleral icterus.       Right eye: No discharge.        Left eye: No discharge.     Extraocular Movements: Extraocular movements intact.     Conjunctiva/sclera: Conjunctivae normal.     Pupils: Pupils are equal, round, and reactive to light.  Neck:     Musculoskeletal: Normal range of motion and neck supple.  Cardiovascular:     Rate  and Rhythm: Normal rate and regular rhythm.     Pulses: Normal pulses.     Heart sounds: Normal heart sounds. No murmur. No friction rub. No gallop.   Pulmonary:     Effort: Pulmonary effort is normal. No respiratory distress.     Breath sounds: Normal breath sounds. No stridor. No wheezing, rhonchi or rales.  Chest:     Chest wall: No tenderness.  Musculoskeletal: Normal range of motion.  Skin:    General: Skin is warm and dry.     Capillary Refill: Capillary refill takes less than 2 seconds.     Coloration: Skin is not jaundiced or pale.     Findings: No bruising, erythema, lesion or rash.  Neurological:     General: No focal deficit present.     Mental Status: She is alert and oriented to person,  place, and time. Mental status is at baseline.  Psychiatric:        Mood and Affect: Mood normal.        Behavior: Behavior normal.        Thought Content: Thought content normal.        Judgment: Judgment normal.     Results for orders placed or performed in visit on 07/30/18  Bayer DCA Hb A1c Waived  Result Value Ref Range   HB A1C (BAYER DCA - WAIVED) 7.5 (H) <7.0 %      Assessment & Plan:   Problem List Items Addressed This Visit      Cardiovascular and Mediastinum   Chronic systolic heart failure (HCC) (Chronic)    Following with cardiology, CHF clinic, telephone paramedicine. Euvolemic today. Call with any concerns.         Endocrine   DM (diabetes mellitus), type 2 with renal complications (HCC) - Primary    Better than last visit with A1c of 7.5- will start jardiance and continue metformin. Elevated sugar 2 days ago due to not taking her medicine. Advised her to take her medicine daily.      Relevant Medications   empagliflozin (JARDIANCE) 25 MG TABS tablet   Other Relevant Orders   Bayer DCA Hb A1c Waived (Completed)   Multiple thyroid nodules    Had her repeat thyroid ultrasound done in September. Doesn't want to see endocrine just yet- too many appointments. Will discuss again in the new year.       Relevant Orders   US Soft Tissue Head/Neck    Other Visit Diagnoses    Abnormal kidney function       Labs have been better. Continue to monitor. Labs drawn today.   Relevant Orders   Comprehensive metabolic panel   Screening for colon cancer       Will do cologuard. Ordered today.        Follow up plan: Return in about 4 weeks (around 08/27/2018).

## 2018-07-30 NOTE — Assessment & Plan Note (Signed)
Following with cardiology, CHF clinic, telephone paramedicine. Euvolemic today. Call with any concerns.

## 2018-07-30 NOTE — Progress Notes (Signed)
[facility] MD OP Progress Note  07/30/2018 5:54 PM Bonnie Ochoa  MRN:  [id number]  Chief Complaint: ' I am here for follow up." Chief Complaint    Follow-up; Medication Refill     HPI: Bonnie Ochoa is a 52 year old African-American female, engaged, lives in [place], has a history of bipolar disorder, panic disorder, PTSD, ADD, agoraphobia, cannabis use disorder, multiple medical problems including osteoarthritis, congestive heart failure, cirrhosis of liver, diabetes, hypertension, IBS, chronic renal failure, OSA on CPAP, RLS, presented to the clinic today for a follow-up visit.  Patient today presented along with her significant other.  Patient today reports mood symptoms are stable.  Denies any significant anxiety or agitation.  Pt reports being compliant with medications.  Denies any side effects.  She denies any suicidality, homicidality or perceptual disturbances.  Patient reports sleep is good.  Patient reports patient has been noncompliant with CPAP since it is currently being replaced.  Patient looks forward to Christmas holidays which she plans to spend with her family in [place].  She has stopped smoking cannabis and continues to stay away from it.   Visit Diagnosis:    ICD-10-CM   1. Bipolar disorder, in partial remission, most recent episode depressed (HCC) F31.75 lamoTRIgine (LAMICTAL) 25 MG tablet  2. Chronic post-traumatic stress disorder (PTSD) F43.12 QUEtiapine (SEROQUEL) 300 MG tablet    PARoxetine (PAXIL) 30 MG tablet    lamoTRIgine (LAMICTAL) 25 MG tablet  3. Panic disorder F41.0 PARoxetine (PAXIL) 30 MG tablet  4. Cannabis use disorder, moderate, in early remission ([place]) F12.21     Past Psychiatric History: Reviewed past psychiatric history from my progress note on 10/16/2017  Past Medical History:  Past Medical History:  Diagnosis Date  . Abdominal pain    a. 05/2018 HIDA scan wnl.  . ADHD   . Anemia   . Arthritis   . Asthma   . Bipolar 1 disorder ([place])   .  Chest pain    a. Hx of cath in [place] - reportedly nl; b. 04/2018 MV: EF 22%, fixed dist ant septal, apical, and inferoapical defects - ? scar vs. attenuation. No ischemia.  . CHF (congestive heart failure) ([place])   . Cirrhosis of liver ([place])   . Coronary artery disease   . Depression   . Diabetes mellitus without complication ([place])   . Diverticulitis   . Dysrhythmia   . Heart murmur   . HFrEF (heart failure with reduced ejection fraction) ([place])    a. 06/2017 Echo: EF 20-25%, diff HK. Mildly dil LA; b. 05/2018 Echo: EF 25-30%, diff HK, Gr2 DD. Triv AI. Mod MR. Mildly reduced RV fxn. Mod-Sev TR. PASP 45-26mHg.  .Marland KitchenHypertension   . Hypothyroidism   . IBS (irritable bowel syndrome)   . Insomnia   . Migraines   . NICM (nonischemic cardiomyopathy) ([place]    a. EF prev 25%; b. 11/2014 s/p SJM Fortify [place], single lead AICD (ser# [id number]; c. 06/2017 Echo: EF 20-25%; d. 05/2018 Echo: EF 25-30%, diff HK, Gr2 DD; e. 05/2018 Cath: Nl cors. LVEDP 290mg, PCWP 3215m. PA 65/40 (52). CO/CI 3.04/1.56.  . OSA (obstructive sleep apnea)   . PTSD (post-traumatic stress disorder)   . Restless leg syndrome   . Sleep apnea   . Vertigo     Past Surgical History:  Procedure Laterality Date  . ABDOMINAL HYSTERECTOMY    . CARDIAC CATHETERIZATION    . CORONARY ARTERY BYPASS GRAFT    . debribalator  2016  . INSERT / REPLACE /  REMOVE PACEMAKER    . INSERTION OF ICD    . RIGHT/LEFT HEART CATH AND CORONARY ANGIOGRAPHY N/A 06/09/2018   Procedure: RIGHT/LEFT HEART CATH AND CORONARY ANGIOGRAPHY;  Surgeon: Wellington Hampshire, MD;  Location: [facility];  Service: Cardiovascular;  Laterality: N/A;  . TONSILLECTOMY    . TONSILLECTOMY    . TONSILLECTOMY    . TUBAL LIGATION  1980  . TUBAL LIGATION      Family Psychiatric History: Reviewed family psychiatric history from my progress note on 10/16/2017.  Family History:  Family History  Problem Relation Age of Onset  . Hypertension Mother   . Hyperlipidemia  Mother   . Heart disease Mother   . Hypertension Sister   . Asthma Sister   . Heart disease Sister   . Diabetes Sister   . Cancer Sister   . Alzheimer's disease Maternal Grandfather   . Hyperlipidemia Brother   . Asthma Sister   . Hypertension Sister   . Diabetes Sister     Social History: Reviewed social history from my progress note on 10/16/2017. Social History   Socioeconomic History  . Marital status: Married    Spouse name: Not on file  . Number of children: 2  . Years of education: Not on file  . Highest education level: High school graduate  Occupational History    Comment: disabled  Social Needs  . Financial resource strain: Not hard at all  . Food insecurity:    Worry: Sometimes true    Inability: Sometimes true  . Transportation needs:    Medical: Yes    Non-medical: Yes  Tobacco Use  . Smoking status: Former Smoker    Types: Cigarettes  . Smokeless tobacco: Never Used  . Tobacco comment: quit about 20 years ago   Substance and Sexual Activity  . Alcohol use: Not Currently  . Drug use: Not Currently    Frequency: 1.0 times per week    Types: Marijuana  . Sexual activity: Yes    Birth control/protection: Surgical    Comment: one partner  Lifestyle  . Physical activity:    Days per week: 0 days    Minutes per session: 0 min  . Stress: Very much  Relationships  . Social connections:    Talks on phone: Never    Gets together: Twice a week    Attends religious service: Never    Active member of club or organization: No    Attends meetings of clubs or organizations: Never    Relationship status: Married  Other Topics Concern  . Not on file  Social History Narrative   Volunteers occasionally     Allergies:  Allergies  Allergen Reactions  . Levothyroxine Rash    Metabolic Disorder Labs: Lab Results  Component Value Date   HGBA1C 8.1 (H) 05/08/2018   MPG 185.77 05/08/2018   MPG >398 07/03/2017   No results found for: PROLACTIN Lab Results   Component Value Date   CHOL 168 05/09/2018   TRIG 265 (H) 05/09/2018   HDL 31 (L) 05/09/2018   CHOLHDL 5.4 05/09/2018   VLDL 53 (H) 05/09/2018   LDLCALC 84 05/09/2018   LDLCALC 76 02/19/2018   Lab Results  Component Value Date   TSH 1.560 02/19/2018   TSH 2.620 07/15/2017    Therapeutic Level Labs: No results found for: LITHIUM No results found for: VALPROATE No components found for:  CBMZ  Current Medications: Current Outpatient Medications  Medication Sig Dispense Refill  .  blood glucose meter kit and supplies KIT Dispense based on patient and insurance preference. Use up to four times daily as directed. (FOR ICD-9 250.00, 250.01). 1 each 0  . carvedilol (COREG) 12.5 MG tablet Take 1 tablet (12.5 mg total) by mouth 2 (two) times daily. 180 tablet 3  . cyclobenzaprine (FLEXERIL) 5 MG tablet Take 5 mg by mouth 3 (three) times daily as needed for muscle spasms.    Marland Kitchen dicyclomine (BENTYL) 20 MG tablet Take 1 tablet (20 mg total) by mouth 3 (three) times daily as needed for spasms. (Patient taking differently: Take 20 mg by mouth 3 (three) times daily as needed for spasms. Abdominal spasms.) 90 tablet 2  . digoxin (LANOXIN) 0.125 MG tablet Take 1 tablet (0.125 mg total) by mouth daily. 90 tablet 3  . empagliflozin (JARDIANCE) 25 MG TABS tablet Take 25 mg by mouth daily. 30 tablet 3  . famotidine (PEPCID) 40 MG tablet Take 1 tablet (40 mg total) by mouth every evening. 90 tablet 1  . furosemide (LASIX) 40 MG tablet Take 1 tablet (40 mg total) by mouth daily. 30 tablet 6  . Insulin Glargine (LANTUS SOLOSTAR) 100 UNIT/ML Solostar Pen Inject 20 Units into the skin daily at 10 pm. 5 pen 12  . Insulin Pen Needle 32G X 6 MM MISC 1 each by Does not apply route daily. 100 each 12  . lamoTRIgine (LAMICTAL) 25 MG tablet Take 1 tablet (25 mg total) by mouth 2 (two) times daily. 180 tablet 1  . metoCLOPramide (REGLAN) 10 MG tablet Take 1 tablet (10 mg total) by mouth 3 (three) times daily with  meals. 90 tablet 1  . nitroGLYCERIN (NITROSTAT) 0.4 MG SL tablet Place 1 tablet (0.4 mg total) under the tongue every 5 (five) minutes as needed for chest pain. (Patient taking differently: Place 0.4 mg under the tongue every 5 (five) minutes as needed for chest pain. Chest pain.) 30 tablet 0  . ondansetron (ZOFRAN ODT) 4 MG disintegrating tablet Take 1 tablet (4 mg total) by mouth every 8 (eight) hours as needed for nausea or vomiting. 20 tablet 0  . pantoprazole (PROTONIX) 40 MG tablet Take 1 tablet (40 mg total) by mouth daily. 30 tablet 11  . PARoxetine (PAXIL) 30 MG tablet Take 1 tablet (30 mg total) by mouth daily. 90 tablet 1  . potassium chloride SA (K-DUR,KLOR-CON) 20 MEQ tablet Take 1 tablet (20 mEq total) by mouth daily. 90 tablet 3  . QUEtiapine (SEROQUEL) 300 MG tablet Take 1 tablet (300 mg total) by mouth at bedtime. 90 tablet 1  . sacubitril-valsartan (ENTRESTO) 24-26 MG Take 1 tablet by mouth 2 (two) times daily. 60 tablet 6  . spironolactone (ALDACTONE) 25 MG tablet Take 1 tablet (25 mg total) by mouth daily. 90 tablet 3  . sucralfate (CARAFATE) 1 g tablet TAKE 1 TABLET (1 G TOTAL) BY MOUTH 4 (FOUR) TIMES DAILY. 360 tablet 3   No current facility-administered medications for this visit.      Musculoskeletal: Strength & Muscle Tone: within normal limits Gait & Station: normal Patient leans: N/A  Psychiatric Specialty Exam: Review of Systems  Psychiatric/Behavioral: The patient is not nervous/anxious.   All other systems reviewed and are negative.   Blood pressure 136/85, pulse 81, temperature 98 F (36.7 C), temperature source Oral, weight 201 lb 6.4 oz (91.4 kg).Body mass index is 33.51 kg/m.  General Appearance: Casual  Eye Contact:  Fair  Speech:  Normal Rate  Volume:  Normal  Mood:  Euthymic  Affect:  Congruent  Thought Process:  Goal Directed and Descriptions of Associations: Intact  Orientation:  Full (Time, Place, and Person)  Thought Content: Logical    Suicidal Thoughts:  No  Homicidal Thoughts:  No  Memory:  Immediate;   Fair Recent;   Fair Remote;   Fair  Judgement:  Fair  Insight:  Fair  Psychomotor Activity:  Normal  Concentration:  Concentration: Fair and Attention Span: Fair  Recall:  [employer] of Knowledge: Fair  Language: Fair  Akathisia:  No  Handed:  Right  AIMS (if indicated): 0  Assets:  Communication Skills Desire for Improvement Social Support  ADL's:  Intact  Cognition: WNL  Sleep:  Fair   Screenings: GAD-7     Office Visit from 12/28/2016 in [facility]  Total GAD-7 Score  21    PHQ2-9     Patient Outreach Telephone from 06/16/2018 in [facility] Office Visit from 05/08/2018 in [facility] Office Visit from 04/23/2018 in [facility] from 02/19/2018 in [facility] Office Visit from 12/24/2017 in [facility]  PHQ-2 Total Score  0  1  0  2  0  PHQ-9 Total Score  -  -  -  7  -       Assessment and Plan: Shayleen is a 52 year old African-American female who has a history of bipolar disorder, PTSD, panic disorder, cannabis use disorder, congestive heart failure, liver disease, chronic renal failure, diabetes mellitus, OSA, hypertension, RLS who, presented to the clinic today for a follow-up visit.  Patient is currently doing well on the current combination of medications.  She denies any concerns today.  Will continue plan as noted below.  Plan Bipolar disorder Continue Seroquel 300 mg p.o. nightly.  Discussed with patient that dosage cannot be tapered down if she continues to do well. Lamictal 25 mg p.o. twice daily Paxil 30 mg p.o. daily  For PTSD Paxil and Seroquel as prescribed  For panic disorder Paxil 30 mg p.o. daily  For insomnia Seroquel 300 mg p.o. nightly OSA, noncompliant with CPAP- waiting to get the device  replaced.  Cannabis use disorder-in early remission She continues to stay away.  Alcohol use disorder in remission She quit in 2006, occasional use only now.  She will continue psychotherapy sessions with Ms. Peacock.  Follow-up in clinic in 6 months or sooner if needed.  More than 50 % of the time was spent for psychoeducation and supportive psychotherapy and care coordination.  This note was generated in part or whole with voice recognition software. Voice recognition is usually quite accurate but there are transcription errors that can and very often do occur. I apologize for any typographical errors that were not detected and corrected.        [name], MD 07/30/2018, 5:54 PM

## 2018-07-31 ENCOUNTER — Other Ambulatory Visit: Payer: Self-pay | Admitting: Family Medicine

## 2018-07-31 ENCOUNTER — Other Ambulatory Visit (HOSPITAL_COMMUNITY): Payer: Self-pay

## 2018-07-31 ENCOUNTER — Telehealth: Payer: Self-pay | Admitting: Family Medicine

## 2018-07-31 ENCOUNTER — Encounter (HOSPITAL_COMMUNITY): Payer: Self-pay

## 2018-07-31 LAB — COMPREHENSIVE METABOLIC PANEL
ALT: 11 IU/L (ref 0–32)
AST: 14 IU/L (ref 0–40)
Albumin/Globulin Ratio: 1.1 — ABNORMAL LOW (ref 1.2–2.2)
Albumin: 4.4 g/dL (ref 3.5–5.5)
Alkaline Phosphatase: 119 IU/L — ABNORMAL HIGH (ref 39–117)
BUN/Creatinine Ratio: 16 (ref 9–23)
BUN: 16 mg/dL (ref 6–24)
Bilirubin Total: 0.5 mg/dL (ref 0.0–1.2)
CO2: 21 mmol/L (ref 20–29)
Calcium: 9.7 mg/dL (ref 8.7–10.2)
Chloride: 100 mmol/L (ref 96–106)
Creatinine, Ser: 0.99 mg/dL (ref 0.57–1.00)
GFR calc Af Amer: 75 mL/min/{1.73_m2} (ref >59)
GFR calc non Af Amer: 65 mL/min/{1.73_m2} (ref >59)
Globulin, Total: 4.1 g/dL (ref 1.5–4.5)
Glucose: 157 mg/dL — ABNORMAL HIGH (ref 65–99)
Potassium: 3.7 mmol/L (ref 3.5–5.2)
Sodium: 140 mmol/L (ref 134–144)
TOTAL PROTEIN: 8.5 g/dL (ref 6.0–8.5)

## 2018-07-31 NOTE — Telephone Encounter (Signed)
See note

## 2018-07-31 NOTE — Progress Notes (Signed)
Bp: 138/80, pulse: 60, resp: 18, spo2: 98%, last weight: 196, todays: 195 lbs  Visit today Bonnie Ochoa states she has been feeling good.  She states her sugars have been running high and her doctor has changed her meds.  She states she has started eating a little better, she fell off eating potato chips and bread.  She denis shortness of breath, chest pain, dizziness or headaches.  They have a trip coming up and we discussed that.  Discussed holidays and diet. Meds verified and wife places meds in box for her. Lungs are clear.  No swelling in extremities.  Appetite has been good. Will continue to visit weekly.  Earmon Phoenix [place] EMT-Paramedic [phone]

## 2018-07-31 NOTE — Telephone Encounter (Signed)
Copied from [place] [id number]. Topic: Quick Communication - Rx Refill/Question >> Jul 31, 2018  2:02 PM Bea Graff, NT wrote: Medication: blood glucose meter kit and supplies   Has the patient contacted their pharmacy? Yes.   (Agent: If no, request that the patient contact the pharmacy for the refill.) (Agent: If yes, when and what did the pharmacy advise?)  Preferred Pharmacy (with phone number or street name): [id number] [place] [place] [phone](Phone) [phone](Fax)    Agent: Please be advised that RX refills may take up to 3 business days. We ask that you follow-up with your pharmacy.

## 2018-07-31 NOTE — Telephone Encounter (Signed)
Requested medication (s) are due for refill today: yes  Requested medication (s) are on the active medication list: yes  Last refill:  02/05/18 for 90 tabs and 2 refills  Future visit scheduled: yes  Notes to clinic:  none  Requested Prescriptions  Pending Prescriptions Disp Refills   dicyclomine (BENTYL) 20 MG tablet [Pharmacy Med Name: DICYCLOMINE 20 MG TABLET] 270 tablet 0    Sig: Take 1 tablet (20 mg total) by mouth 3 (three) times daily as needed for spasms.     Gastroenterology:  Antispasmodic Agents Passed - 07/31/2018  1:34 AM      Passed - Last Heart Rate in normal range    Pulse Readings from Last 1 Encounters:  07/30/18 81         Passed - Valid encounter within last 12 months    Recent Outpatient Visits          Yesterday Type 2 diabetes mellitus with stage 3 chronic kidney disease, with long-term current use of insulin (HCC)   [facility] [place], [place], DO   1 month ago Loss of vision   [facility], [place], DO   1 month ago Chest pain, unspecified type   [facility] [place], Megan P, DO   3 months ago Acute renal failure, unspecified acute renal failure type [facility])   [facility] Johnson, Megan P, DO   3 months ago Alcoholic cirrhosis of liver with ascites [facility])   [facility] [place], [place], DO      Future Appointments            In 1 month Johnson, Oralia Rud, DO [facility]   In 3 months Tahiliani, Dolphus Jenny, MD [facility]   In 6 months  [facility]   In 6 months Johnson, Oralia Rud, DO [facility]

## 2018-08-01 ENCOUNTER — Other Ambulatory Visit: Payer: Self-pay | Admitting: Internal Medicine

## 2018-08-01 NOTE — Telephone Encounter (Signed)
Form completed, signed by provider, and [facility] [address].

## 2018-08-01 NOTE — Telephone Encounter (Signed)
Can we please get a diabetic testing supply form for her? Thanks!

## 2018-08-01 NOTE — Telephone Encounter (Signed)
Done and faxed

## 2018-08-04 ENCOUNTER — Telehealth: Payer: Self-pay

## 2018-08-04 ENCOUNTER — Ambulatory Visit: Payer: Medicare Other

## 2018-08-04 NOTE — Telephone Encounter (Signed)
LMOVM reminding pt to send remote transmission.   

## 2018-08-07 NOTE — Progress Notes (Signed)
No ICM remote transmission received for 08/04/2018 and next ICM transmission scheduled for 08/18/2018.

## 2018-08-11 ENCOUNTER — Encounter (HOSPITAL_COMMUNITY): Payer: Medicare Other

## 2018-08-12 ENCOUNTER — Other Ambulatory Visit: Payer: Self-pay | Admitting: *Deleted

## 2018-08-12 ENCOUNTER — Encounter: Payer: Self-pay | Admitting: *Deleted

## 2018-08-12 LAB — CUP PACEART REMOTE DEVICE CHECK
Battery Remaining Percentage: 70 %
Battery Voltage: 3.02 V
Brady Statistic RV Percent Paced: 1 %
Date Time Interrogation Session: 20191101210618
HighPow Impedance: 75 Ohm
HighPow Impedance: 75 Ohm
Implantable Lead Implant Date: 20160410
Implantable Pulse Generator Implant Date: 20160410
Lead Channel Impedance Value: 410 Ohm
Lead Channel Pacing Threshold Amplitude: 1 V
Lead Channel Pacing Threshold Pulse Width: 0.5 ms
Lead Channel Sensing Intrinsic Amplitude: 12 mV
Lead Channel Setting Pacing Pulse Width: 0.5 ms
Lead Channel Setting Sensing Sensitivity: 0.5 mV
MDC IDC LEAD LOCATION: 753860
MDC IDC MSMT BATTERY REMAINING LONGEVITY: 77 mo
MDC IDC SET LEADCHNL RV PACING AMPLITUDE: 2.5 V
Pulse Gen Serial Number: 7259964

## 2018-08-12 NOTE — Patient Outreach (Signed)
[facility] Customer service manager [facility]) Care Management [facility] CM Telephone Outreach PCP office completes Transition of Care follow up post-hospital discharge Post-hospital discharge day # 60 Unsuccessful consecutive telephone outreach # 1- engaged patient 08/12/2018  Bonnie Ochoa Apr 30, 1966 [id number]  Unsuccessful telephone outreach to [facility], 52 year old female referred to [facility] Community CM by inpatient RN CM after recent hospitalization October 26-June 13, 2018 for acute on chronic CHFexacerbation. Patient was discharged home to self care without home health services in place.Patient has history including, but not limited to, CHF/ cardiomyopathy;previous NSTEMI in 2016 with ICD placement;OSA; HTN; DM; Bipolar disorder; cirrhosis.   HIPAA compliant voice mail message left for patient, requesting return call back.  Plan:  Will re-attempt [facility] CM telephone outreach within 4 business days if I do not hear back from patient first.  Caryl Pina, RN, BSN, CCRN [employer] Coordinator [facility] Care Management  [phone]

## 2018-08-19 ENCOUNTER — Other Ambulatory Visit: Payer: Self-pay | Admitting: *Deleted

## 2018-08-19 ENCOUNTER — Encounter: Payer: Self-pay | Admitting: *Deleted

## 2018-08-19 ENCOUNTER — Telehealth (HOSPITAL_COMMUNITY): Payer: Self-pay

## 2018-08-19 NOTE — Telephone Encounter (Signed)
Attempted to contact Bonnie Ochoa today to schedule home visit.  I know she went to [place] during holidays and not sure what date they were coming back.  Will continue to contact.  Earmon Phoenix [place] EMT-Paramedic [phone]

## 2018-08-19 NOTE — Progress Notes (Signed)
No ICM remote transmission received for 08/18/2018 and next ICM transmission scheduled for 09/11/2018.

## 2018-08-19 NOTE — Patient Outreach (Signed)
[facility] Customer service manager [facility]) Care Management [facility] CM Telephone Outreach Post-hospital discharge day # 67 Unsuccessful consecutive outreach attempt # 2  08/19/2018  Abbie Dykeman September 11, 1965 [id number]  Unsuccessful telephone outreach x 2 to [facility], 53 year old female referred to [facility] Community CM by inpatient RN CM after recent hospitalization October 26-June 13, 2018 for acute on chronic CHFexacerbation. Patient was discharged home to self care without home health services in place.Patient has history including, but not limited to, CHF/ cardiomyopathy;previous NSTEMI in 2016 with ICD placement;OSA; HTN; DM; Bipolar disorder; cirrhosis.   HIPAA compliant voice mail message left for patient on both cell and home numbers listed in EMR, requesting return call back.  Plan:  Will place [facility] Community CM unsuccessful patient outreach letter in mail requesting call back in writing  Will re-attempt [facility] Community CM telephone outreach within 4 business days if I do not hear back from patient first.  Caryl Pina, RN, BSN, [employer] Coordinator [facility] Care Management  [phone]

## 2018-08-22 ENCOUNTER — Other Ambulatory Visit: Payer: Self-pay | Admitting: *Deleted

## 2018-08-22 ENCOUNTER — Encounter: Payer: Self-pay | Admitting: *Deleted

## 2018-08-22 ENCOUNTER — Ambulatory Visit: Payer: Medicare Other | Admitting: Family

## 2018-08-22 NOTE — Patient Outreach (Signed)
[facility] Customer service manager [facility]) Care Management [facility] Community CM Telephone Outreach Post-hospital discharge day # 70 Unsuccessful consecutive outreach attempt # 3  08/22/2018  Bonnie Ochoa 09/15/1965 [id number]  Unsuccessful third attempt for telephone outreach to [facility], 53 year old female referred to [facility] Community CM by inpatient RN CM after recent hospitalization October 26-June 13, 2018 for acute on chronic CHFexacerbation. Patient was discharged home to self care without home health services in place.Patient has history including, but not limited to, CHF/ cardiomyopathy;previous NSTEMI in 2016 with ICD placement;OSA; HTN; DM; Bipolar disorder; cirrhosis.   Today, I was unable to leave patient voice message requesting call back, as automated outgoing voice message on her phone states that her voice mail box is full.  Plan:  Verified that [facility] Community CM unsuccessful patient outreach letter was placed in mail to patient requesting call back in writing on August 19, 2018  Will move to [facility] Community CM case closure next [employer] I do not hear back from patient first, as patient has not responded to last 3 outreach attempts nor written request for follow up  Caryl Pina, RN, BSN, [employer] Coordinator [facility] Care Management  [phone]

## 2018-08-25 ENCOUNTER — Encounter (HOSPITAL_COMMUNITY): Payer: Self-pay

## 2018-08-25 ENCOUNTER — Other Ambulatory Visit: Payer: Self-pay

## 2018-08-25 ENCOUNTER — Ambulatory Visit (HOSPITAL_COMMUNITY)
Admission: RE | Admit: 2018-08-25 | Discharge: 2018-08-25 | Disposition: A | Discharge status: Home / Self Care | Payer: Medicare Other | Source: Ambulatory Visit | Attending: Internal Medicine | Admitting: Internal Medicine

## 2018-08-25 VITALS — BP 164/102 | HR 91 | Wt 201.6 lb

## 2018-08-25 DIAGNOSIS — G2581 Restless legs syndrome: Secondary | ICD-10-CM | POA: Diagnosis not present

## 2018-08-25 DIAGNOSIS — I5042 Chronic combined systolic (congestive) and diastolic (congestive) heart failure: Secondary | ICD-10-CM | POA: Diagnosis not present

## 2018-08-25 DIAGNOSIS — F319 Bipolar disorder, unspecified: Secondary | ICD-10-CM | POA: Diagnosis not present

## 2018-08-25 DIAGNOSIS — I130 Hypertensive heart and chronic kidney disease with heart failure and stage 1 through stage 4 chronic kidney disease, or unspecified chronic kidney disease: Secondary | ICD-10-CM | POA: Insufficient documentation

## 2018-08-25 DIAGNOSIS — E669 Obesity, unspecified: Secondary | ICD-10-CM | POA: Insufficient documentation

## 2018-08-25 DIAGNOSIS — E1122 Type 2 diabetes mellitus with diabetic chronic kidney disease: Secondary | ICD-10-CM | POA: Diagnosis not present

## 2018-08-25 DIAGNOSIS — G4733 Obstructive sleep apnea (adult) (pediatric): Secondary | ICD-10-CM | POA: Insufficient documentation

## 2018-08-25 DIAGNOSIS — K7460 Unspecified cirrhosis of liver: Secondary | ICD-10-CM | POA: Diagnosis not present

## 2018-08-25 DIAGNOSIS — F909 Attention-deficit hyperactivity disorder, unspecified type: Secondary | ICD-10-CM | POA: Diagnosis not present

## 2018-08-25 DIAGNOSIS — G4700 Insomnia, unspecified: Secondary | ICD-10-CM | POA: Diagnosis not present

## 2018-08-25 DIAGNOSIS — F4310 Post-traumatic stress disorder, unspecified: Secondary | ICD-10-CM | POA: Insufficient documentation

## 2018-08-25 DIAGNOSIS — Z6833 Body mass index (BMI) 33.0-33.9, adult: Secondary | ICD-10-CM | POA: Insufficient documentation

## 2018-08-25 DIAGNOSIS — I10 Essential (primary) hypertension: Secondary | ICD-10-CM | POA: Diagnosis not present

## 2018-08-25 DIAGNOSIS — Z95810 Presence of automatic (implantable) cardiac defibrillator: Secondary | ICD-10-CM | POA: Insufficient documentation

## 2018-08-25 DIAGNOSIS — E039 Hypothyroidism, unspecified: Secondary | ICD-10-CM | POA: Insufficient documentation

## 2018-08-25 DIAGNOSIS — Z794 Long term (current) use of insulin: Secondary | ICD-10-CM | POA: Insufficient documentation

## 2018-08-25 DIAGNOSIS — N182 Chronic kidney disease, stage 2 (mild): Secondary | ICD-10-CM | POA: Insufficient documentation

## 2018-08-25 DIAGNOSIS — Z87891 Personal history of nicotine dependence: Secondary | ICD-10-CM | POA: Insufficient documentation

## 2018-08-25 DIAGNOSIS — I5022 Chronic systolic (congestive) heart failure: Secondary | ICD-10-CM

## 2018-08-25 DIAGNOSIS — Z79899 Other long term (current) drug therapy: Secondary | ICD-10-CM | POA: Insufficient documentation

## 2018-08-25 DIAGNOSIS — E042 Nontoxic multinodular goiter: Secondary | ICD-10-CM | POA: Diagnosis not present

## 2018-08-25 DIAGNOSIS — I2510 Atherosclerotic heart disease of native coronary artery without angina pectoris: Secondary | ICD-10-CM | POA: Insufficient documentation

## 2018-08-25 DIAGNOSIS — I428 Other cardiomyopathies: Secondary | ICD-10-CM | POA: Diagnosis not present

## 2018-08-25 DIAGNOSIS — Z8249 Family history of ischemic heart disease and other diseases of the circulatory system: Secondary | ICD-10-CM | POA: Insufficient documentation

## 2018-08-25 LAB — BASIC METABOLIC PANEL
Anion gap: 11 (ref 5–15)
BUN: 10 mg/dL (ref 6–20)
CO2: 25 mmol/L (ref 22–32)
Calcium: 9.5 mg/dL (ref 8.9–10.3)
Chloride: 100 mmol/L (ref 98–111)
Creatinine, Ser: 1.06 mg/dL — ABNORMAL HIGH (ref 0.44–1.00)
GFR calc Af Amer: >60 mL/min (ref >60)
GFR calc non Af Amer: 59 mL/min — ABNORMAL LOW (ref >60)
GLUCOSE: 217 mg/dL — AB (ref 70–99)
Potassium: 3.6 mmol/L (ref 3.5–5.1)
Sodium: 136 mmol/L (ref 135–145)

## 2018-08-25 LAB — DIGOXIN LEVEL: Digoxin Level: 0.4 ng/mL — ABNORMAL LOW (ref 0.8–2.0)

## 2018-08-25 MED ORDER — SACUBITRIL-VALSARTAN 49-51 MG PO TABS: 1.0000 | ORAL_TABLET | Freq: Two times a day (BID) | ORAL | 3 refills | Status: DC

## 2018-08-25 NOTE — Progress Notes (Signed)
ADVANCED HF CLINIC  NOTE  Referring Physician: Carmela Rima NP  Primary Care: Dr Rosita Kea Primary Cardiologist: Dr Rockey Situ  Nephrology: Dr Billey Chang HF MD: Dr Haroldine Laws  HPI: Mrs Smart was being referred to the [facility] at the request of [name] NP for heart failure consultation.   Mrs Parrilla is a 53 year old with a history of NICM, chronic systolic heart failure,St Jude single chamber ICD,  DMII, HTN, bipolar disorder, OSA, PTSD, depression, cataract right eye, thyroid nodules, former heavy smoker, and cirrhosis. Initially diagnosed with HF in 2008 while living in [place]. In 2017 she relocated to [place] to be closer to her sister.   She has had multiple hospital admissions and ED visits at [facility] over the last 3 months for a/c systolic heart failure.    Admitted to [facility] in June 07, 2018 with chest pain and shortness of breath. Diuresed with IV lasix and later underwent RHC/LHC. She has elevated filling pressures and lowe cardiac index was 1.5. Since discharge entresto was started on 06/20/2018.   She has been followed closely at [facility] in [place] and was last seen 07/08/2018. Low dose spironolactone was started. Weight at that time was 194 pounds.   Today she returns for HF follow up. Overall feeling fair. Get tired after walking. Uses the electric cart at the grocery more often. Appetite ok. SOB with steps. Denies PND. Sleeps on 3 pillows. Weight at home 196--199 pounds. Tries to walk on the treadmill 15 minutes. Taking all medications but did not take meds this morning. Her wife sets up all medications in a pill box.  Lives with her wife and 2 other women.   CorVue : Impendance down. No VT  CPX 07/16/2018  Peak VO2: 11.8 (66% predicted peak VO2) VE/VCO2 slope: 37 OUES: 1.12 Peak RER: 1.09 Moderate limitation with moderately elevated VE/VCO2.   Cardiac Test 05/2018 RHC/LHC   Normal coronary arteries. PA 65/40 Mean 52  PCWP 32 PVR 6.6 CO 3.04 CI 1.56.    ECHO 06/08/2018  Left ventricle: The cavity size was severely dilated. Wall   thickness was at the upper limits of normal. Systolic function   was severely reduced. The estimated ejection fraction was in the   range of 25% to 30%. Diffuse hypokinesis. Features are consistent   with a pseudonormal left ventricular filling pattern, with   concomitant abnormal relaxation and increased filling pressure   (grade 2 diastolic dysfunction). Doppler parameters are   consistent with high ventricular filling pressure. - Aortic valve: Trileaflet; mildly thickened, mildly calcified   leaflets. There was trivial regurgitation. - Mitral valve: There was at least moderate regurgitation. - Left atrium: The atrium was mildly dilated. - Right ventricle: The cavity size was mildly dilated. Wall   thickness was normal. Systolic function was mildly reduced. - Right atrium: The atrium was mildly dilated. - Tricuspid valve: There was moderate-severe regurgitation. - Pulmonary arteries: Systolic pressure was moderately increased,   in the range of 45 mm Hg to 50 mm Hg. - Inferior vena cava: The vessel was dilated. The respirophasic   diameter changes were blunted (< 50%), consistent with elevated   central venous pressure. - Pericardium, extracardiac: A small pericardial effusion was   identified.    Past Medical History:  Diagnosis Date  . Abdominal pain    a. 05/2018 HIDA scan wnl.  . ADHD   . Anemia   . Arthritis   . Asthma   . Bipolar 1 disorder ([place])   .  Chest pain    a. Hx of cath in [place] - reportedly nl; b. 04/2018 MV: EF 22%, fixed dist ant septal, apical, and inferoapical defects - ? scar vs. attenuation. No ischemia.  . CHF (congestive heart failure) ([place])   . Cirrhosis of liver ([place])   . Coronary artery disease   . Depression   . Diabetes mellitus without complication ([place])   . Diverticulitis   . Dysrhythmia   . Heart murmur   . HFrEF (heart failure with reduced ejection fraction)  ([place])    a. 06/2017 Echo: EF 20-25%, diff HK. Mildly dil LA; b. 05/2018 Echo: EF 25-30%, diff HK, Gr2 DD. Triv AI. Mod MR. Mildly reduced RV fxn. Mod-Sev TR. PASP 45-21mHg.  .Marland KitchenHypertension   . Hypothyroidism   . IBS (irritable bowel syndrome)   . Insomnia   . Migraines   . N[facility]) [facility]    a. EF prev 25%; b. 11/2014 s/p SJM Fortify [place], single lead AICD (ser# [id number]; c. 06/2017 Echo: EF 20-25%; d. 05/2018 Echo: EF 25-30%, diff HK, Gr2 DD; e. 05/2018 Cath: Nl cors. LVEDP 262mg, PCWP 3265m. PA 65/40 (52). CO/CI 3.04/1.56.  . OSA (obstructive sleep apnea)   . PTSD (post-traumatic stress disorder)   . Restless leg syndrome   . Sleep apnea   . Vertigo     Current Outpatient Medications  Medication Sig Dispense Refill  . carvedilol (COREG) 12.5 MG tablet Take 1 tablet (12.5 mg total) by mouth 2 (two) times daily. 180 tablet 3  . cyclobenzaprine (FLEXERIL) 5 MG tablet Take 5 mg by mouth 3 (three) times daily as needed for muscle spasms.    . dMarland Kitchencyclomine (BENTYL) 20 MG tablet TAKE 1 TABLET (20 MG TOTAL) BY MOUTH 3 (THREE) TIMES DAILY AS NEEDED FOR SPASMS. 270 tablet 0  . digoxin (LANOXIN) 0.125 MG tablet Take 1 tablet (0.125 mg total) by mouth daily. 90 tablet 3  . empagliflozin (JARDIANCE) 25 MG TABS tablet Take 25 mg by mouth daily. 30 tablet 3  . famotidine (PEPCID) 40 MG tablet Take 1 tablet (40 mg total) by mouth every evening. 90 tablet 1  . furosemide (LASIX) 40 MG tablet Take 1 tablet (40 mg total) by mouth daily. 30 tablet 6  . Insulin Glargine (LANTUS SOLOSTAR) 100 UNIT/ML Solostar Pen Inject 20 Units into the skin daily at 10 pm. 5 pen 12  . Insulin Pen Needle 32G X 6 MM MISC 1 each by Does not apply route daily. 100 each 12  . lamoTRIgine (LAMICTAL) 25 MG tablet Take 1 tablet (25 mg total) by mouth 2 (two) times daily. 180 tablet 1  . metoCLOPramide (REGLAN) 10 MG tablet Take 1 tablet (10 mg total) by mouth 3 (three) times daily with meals. 90 tablet 1   . nitroGLYCERIN (NITROSTAT) 0.4 MG SL tablet Place 1 tablet (0.4 mg total) under the tongue every 5 (five) minutes as needed for chest pain. (Patient taking differently: Place 0.4 mg under the tongue every 5 (five) minutes as needed for chest pain. Chest pain.) 30 tablet 0  . ondansetron (ZOFRAN ODT) 4 MG disintegrating tablet Take 1 tablet (4 mg total) by mouth every 8 (eight) hours as needed for nausea or vomiting. 20 tablet 0  . pantoprazole (PROTONIX) 40 MG tablet Take 1 tablet (40 mg total) by mouth daily. 30 tablet 11  . PARoxetine (PAXIL) 30 MG tablet Take 1 tablet (30 mg total) by mouth daily. 90 tablet 1  . potassium chloride SA (K-DUR,KLOR-CON)  20 MEQ tablet Take 1 tablet (20 mEq total) by mouth daily. 90 tablet 3  . QUEtiapine (SEROQUEL) 300 MG tablet Take 1 tablet (300 mg total) by mouth at bedtime. 90 tablet 1  . sacubitril-valsartan (ENTRESTO) 24-26 MG Take 1 tablet by mouth 2 (two) times daily. 60 tablet 6  . spironolactone (ALDACTONE) 25 MG tablet Take 1 tablet (25 mg total) by mouth daily. 90 tablet 3  . sucralfate (CARAFATE) 1 g tablet TAKE 1 TABLET (1 G TOTAL) BY MOUTH 4 (FOUR) TIMES DAILY. 360 tablet 3  . blood glucose meter kit and supplies KIT Dispense based on patient and insurance preference. Use up to four times daily as directed. (FOR ICD-9 250.00, 250.01). (Patient not taking: Reported on 07/31/2018) 1 each 0   No current facility-administered medications for this encounter.     Allergies  Allergen Reactions  . Levothyroxine Rash      Social History   Socioeconomic History  . Marital status: Married    Spouse name: Not on file  . Number of children: 2  . Years of education: Not on file  . Highest education level: High school graduate  Occupational History    Comment: disabled  Social Needs  . Financial resource strain: Not hard at all  . Food insecurity:    Worry: Sometimes true    Inability: Sometimes true  . Transportation needs:    Medical: Yes     Non-medical: Yes  Tobacco Use  . Smoking status: Former Smoker    Types: Cigarettes  . Smokeless tobacco: Never Used  . Tobacco comment: quit about 20 years ago   Substance and Sexual Activity  . Alcohol use: Not Currently  . Drug use: Not Currently    Frequency: 1.0 times per week    Types: Marijuana  . Sexual activity: Yes    Birth control/protection: Surgical    Comment: one partner  Lifestyle  . Physical activity:    Days per week: 0 days    Minutes per session: 0 min  . Stress: Very much  Relationships  . Social connections:    Talks on phone: Never    Gets together: Twice a week    Attends religious service: Never    Active member of club or organization: No    Attends meetings of clubs or organizations: Never    Relationship status: Married  . Intimate partner violence:    Fear of current or ex partner: No    Emotionally abused: No    Physically abused: No    Forced sexual activity: No  Other Topics Concern  . Not on file  Social History Narrative   Volunteers occasionally       Family History  Problem Relation Age of Onset  . Hypertension Mother   . Hyperlipidemia Mother   . Heart disease Mother   . Hypertension Sister   . Asthma Sister   . Heart disease Sister   . Diabetes Sister   . Cancer Sister   . Alzheimer's disease Maternal Grandfather   . Hyperlipidemia Brother   . Asthma Sister   . Hypertension Sister   . Diabetes Sister     Vitals:   08/25/18 1034  BP: (!) 164/102  Pulse: 91  SpO2: 92%  Weight: 91.4 kg (201 lb 9.6 oz)   Wt Readings from Last 3 Encounters:  08/25/18 91.4 kg (201 lb 9.6 oz)  07/31/18 88.5 kg (195 lb)  07/30/18 90 kg (198 lb 6 oz)  PHYSICAL EXAM: General:  No resp difficulty. Walked slowly in the clinic with her wife.  HEENT: normal Neck: supple. no JVD. Carotids 2+ bilat; no bruits. No lymphadenopathy or thryomegaly appreciated. Cor: PMI nondisplaced. Regular rate & rhythm. No rubs, gallops or murmurs. Lungs:  clear Abdomen: soft, nontender, nondistended. No hepatosplenomegaly. No bruits or masses. Good bowel sounds. Extremities: no cyanosis, clubbing, rash, edema Neuro: alert & orientedx3, cranial nerves grossly intact. moves all 4 extremities w/o difficulty. Affect pleasant  EKG: SR with occasional PVC 79 bpm QRS 110 ms  ASSESSMENT & PLAN:  1. Chronic Systolic/Diastolic  Heart Failure , NICM. St Jude single chamber ICD.  05/2018 RHC/LHC cors normal. Cardiac output 3. Cardiac Index 1.56.  CPX 07/2018 -slope 37, RER 1.1  VO 2 11.8  - ECHO 06/08/2018 EF 25-30% Grade II DD. - Dr Haroldine Laws reviewed and discussed CPX test. Plan to repeat CPX in 3-4 months after HF meds optimized.  NYHA IIIb. Continue lasix 40 mg daily.  -Continue spironolactone to 25 mg daily  - continue carvedilol 12.5 mg twice a day. - Continue digoxin 0.125 mg daily.  -Increase entresto 49-51 mg twice a day.  - Can consider bidil next visit.  - Check BMET today and in 7-10 days.  - Briefly discussed advanced therapies such as possible LVAD.  2. HTN  Increase entresto as noted above.  3. CKD Stage II  Renal function normal 0.99 4. DMII On insulin per PCP.  5. Obesity  Body mass index is 33.55 kg/m.  6. Thyroid nodules 12/2016 Head/Neck US with thyroid nodules and R and L.  -Needs yearly [place]. We will need to set up next visit if she hasn't seen her PCP.  Says she has had thyroid biopsy but I don't see results in Epic.   Follow up in 6-8 weeks with Dr Haroldine Laws. Plan to repeat CPX 3-4 months.   Amy Clegg NP-C  10:53 AM   Patient seen and examined with Darrick Grinder, NP. We discussed all aspects of the encounter. I agree with the assessment and plan as stated above.   53 y/o woman with advanced systolic HF in setting of NICM EF 20-25%. Now will chronic NYHA III-IIIB symptoms. Volume status much improved. CPX testing reviewed with her and her wife. Shows moderate to severe HF limitation. Although se has advanced HF, I do not  think she is at the point where she would need advanced therapies just yet. I am also concerned about her mental capacity and ability to handle advanced therapies though she has great social support. FOr now would continue aggressive medical therapy and see how she does. ICD interrogated in clinic. No VT. Volume ok. Agree with increasing Entresto.   Glori Bickers, MD  8:53 PM

## 2018-08-25 NOTE — Patient Instructions (Signed)
EKG done today  INCREASE Entresto 49-51 twice daily  Follow up with Dr. Gala Romney in 6-8 weeks

## 2018-08-27 ENCOUNTER — Other Ambulatory Visit (HOSPITAL_COMMUNITY): Payer: Self-pay

## 2018-08-27 ENCOUNTER — Encounter (HOSPITAL_COMMUNITY): Payer: Self-pay

## 2018-08-27 ENCOUNTER — Other Ambulatory Visit: Payer: Self-pay | Admitting: *Deleted

## 2018-08-27 NOTE — Progress Notes (Signed)
Bp: 138/90, pulse: 95, resp: 18, spo2: 96, last weight: 195 lbs, todays: 199 lbs  Today Bonnie Ochoa states she is tired a lot.  She states trip went well and they are going back to [place] for the weekend but taking a 2 day bus ride.  Advised her the trip during holidays probably has her worn out, she seen cardiology Monday and advised them she has been having chest pain frequently.  She takes nitro when needed and it helps. She has some weight gain, but she states she had more and she has lost back down a few pounds.  She is watching her diet closely again, due to her wife cooking.  She does treadmill at 15 minutes and going to increase to 25 minutes but slowly.  Meds verified, wife places her meds in box.  She is aware of changes to meds (entresto and spirolactone) of increase made by cardiology.  Lungs are clear, no extremity swelling, denies headaches, chest pain today or shortness of breath at rest.  Advised her when exerting herself and feel chest pressure to stop what she is doing.  Discussed diet and advised her to rest on bus ride, lots of stretching and watch diet, especially with snacks.  Will continue to visit weekly for educate on heart failulre.   Earmon Phoenix [place] EMT-Paramedic [phone]

## 2018-08-27 NOTE — Patient Outreach (Signed)
[facility] Customer service manager [facility]) Care Management [facility] Community CM Telephone Outreach Case Closure- unable to maintain contact   08/27/2018  [name] 28-Jun-1966 [id number]  [facility] Community CM Case Closure note re:  Bonnie Ochoa, 53 year old female referred to [facility] Community CM by inpatient RN CM after recent hospitalization October 26-June 13, 2018 for acute on chronic CHFexacerbation. Patient was discharged home to self care without home health services in place.Patient has history including, but not limited to, CHF/ cardiomyopathy;previous NSTEMI in 2016 with ICD placement;OSA; HTN; DM; Bipolar disorder; cirrhosis.   As of end of business day today, I have not received call-back from patient after three consecutive outreach attempts and written request for call back, over course of 10 business days  Plan:  Will close [facility] Community CM case, as I have been unable to maintain contact with patient and will make patient's PCP aware of same  Caryl Pina, RN, BSN, CCRN [facility] Coordinator [facility] Care Management  [phone]

## 2018-09-02 ENCOUNTER — Ambulatory Visit: Payer: Medicare Other | Admitting: Family Medicine

## 2018-09-02 ENCOUNTER — Telehealth: Payer: Self-pay | Admitting: Cardiovascular Disease

## 2018-09-02 NOTE — Telephone Encounter (Signed)
Patient calling  Has an upcoming appointment with Dr Mariah Milling on 1/27 but states that Amy from the [facility] Clinic in [place] has not released her yet Would like to know if she should still keep her 1/27 appointment with Dr Mariah Milling or reschedule for after she is released Please call to discuss

## 2018-09-03 NOTE — Telephone Encounter (Signed)
Probably make sense to move our appointment until after she is released from CHF clinic, Or after approval from the doctors in [place]

## 2018-09-03 NOTE — Telephone Encounter (Signed)
Dr. Mariah Milling,  The patient is scheduled to see you on 09/08/18. She also has appointments with Dr. Gala Romney on 09/18/18 & 10/20/18.  Ok to cancel the appointment 1/27 with you? If so, when do you want to see her back in relation to her other appointments with the CHF clinic?  Thanks!

## 2018-09-04 ENCOUNTER — Ambulatory Visit (INDEPENDENT_AMBULATORY_CARE_PROVIDER_SITE_OTHER): Payer: Medicare Other | Admitting: Family Medicine

## 2018-09-04 ENCOUNTER — Other Ambulatory Visit: Payer: Self-pay

## 2018-09-04 ENCOUNTER — Encounter: Payer: Self-pay | Admitting: Family Medicine

## 2018-09-04 ENCOUNTER — Emergency Department: Payer: Medicare Other

## 2018-09-04 ENCOUNTER — Emergency Department
Admission: EM | Admit: 2018-09-04 | Discharge: 2018-09-04 | Disposition: A | Discharge status: Home / Self Care | Payer: Medicare Other | Attending: Emergency Medicine | Admitting: Emergency Medicine

## 2018-09-04 VITALS — BP 100/68 | HR 76 | Temp 97.6°F | Ht 65.0 in | Wt 204.8 lb

## 2018-09-04 DIAGNOSIS — I10 Essential (primary) hypertension: Secondary | ICD-10-CM

## 2018-09-04 DIAGNOSIS — N183 Chronic kidney disease, stage 3 unspecified: Secondary | ICD-10-CM

## 2018-09-04 DIAGNOSIS — I200 Unstable angina: Secondary | ICD-10-CM

## 2018-09-04 DIAGNOSIS — Z87891 Personal history of nicotine dependence: Secondary | ICD-10-CM | POA: Insufficient documentation

## 2018-09-04 DIAGNOSIS — E119 Type 2 diabetes mellitus without complications: Secondary | ICD-10-CM | POA: Diagnosis not present

## 2018-09-04 DIAGNOSIS — Z794 Long term (current) use of insulin: Secondary | ICD-10-CM

## 2018-09-04 DIAGNOSIS — J45909 Unspecified asthma, uncomplicated: Secondary | ICD-10-CM | POA: Insufficient documentation

## 2018-09-04 DIAGNOSIS — I509 Heart failure, unspecified: Secondary | ICD-10-CM | POA: Insufficient documentation

## 2018-09-04 DIAGNOSIS — I110 Hypertensive heart disease with heart failure: Secondary | ICD-10-CM | POA: Insufficient documentation

## 2018-09-04 DIAGNOSIS — R079 Chest pain, unspecified: Secondary | ICD-10-CM | POA: Diagnosis not present

## 2018-09-04 DIAGNOSIS — E1122 Type 2 diabetes mellitus with diabetic chronic kidney disease: Secondary | ICD-10-CM | POA: Diagnosis not present

## 2018-09-04 DIAGNOSIS — Z79899 Other long term (current) drug therapy: Secondary | ICD-10-CM | POA: Diagnosis not present

## 2018-09-04 LAB — CBC WITH DIFFERENTIAL/PLATELET
ABS IMMATURE GRANULOCYTES: 0.02 10*3/uL (ref 0.00–0.07)
BASOS PCT: 1 %
Basophils Absolute: 0 10*3/uL (ref 0.0–0.1)
Eosinophils Absolute: 0.1 10*3/uL (ref 0.0–0.5)
Eosinophils Relative: 2 %
HCT: 37.8 % (ref 36.0–46.0)
Hemoglobin: 12.4 g/dL (ref 12.0–15.0)
Immature Granulocytes: 0 %
Lymphocytes Relative: 39 %
Lymphs Abs: 2.1 10*3/uL (ref 0.7–4.0)
MCH: 28.8 pg (ref 26.0–34.0)
MCHC: 32.8 g/dL (ref 30.0–36.0)
MCV: 87.7 fL (ref 80.0–100.0)
Monocytes Absolute: 0.5 10*3/uL (ref 0.1–1.0)
Monocytes Relative: 9 %
NEUTROS PCT: 49 %
Neutro Abs: 2.6 10*3/uL (ref 1.7–7.7)
Platelets: 178 10*3/uL (ref 150–400)
RBC: 4.31 MIL/uL (ref 3.87–5.11)
RDW: 14.5 % (ref 11.5–15.5)
WBC: 5.4 10*3/uL (ref 4.0–10.5)
nRBC: 0 % (ref 0.0–0.2)

## 2018-09-04 LAB — COMPREHENSIVE METABOLIC PANEL
ALT: 12 U/L (ref 0–44)
ANION GAP: 7 (ref 5–15)
AST: 14 U/L — ABNORMAL LOW (ref 15–41)
Albumin: 4.1 g/dL (ref 3.5–5.0)
Alkaline Phosphatase: 96 U/L (ref 38–126)
BUN: 19 mg/dL (ref 6–20)
CO2: 29 mmol/L (ref 22–32)
Calcium: 9.3 mg/dL (ref 8.9–10.3)
Chloride: 101 mmol/L (ref 98–111)
Creatinine, Ser: 1.14 mg/dL — ABNORMAL HIGH (ref 0.44–1.00)
GFR calc Af Amer: >60 mL/min (ref >60)
GFR calc non Af Amer: 54 mL/min — ABNORMAL LOW (ref >60)
Glucose, Bld: 303 mg/dL — ABNORMAL HIGH (ref 70–99)
Potassium: 3.1 mmol/L — ABNORMAL LOW (ref 3.5–5.1)
Sodium: 137 mmol/L (ref 135–145)
Total Bilirubin: 0.7 mg/dL (ref 0.3–1.2)
Total Protein: 8 g/dL (ref 6.5–8.1)

## 2018-09-04 LAB — TROPONIN I
Troponin I: 0.04 ng/mL (ref <0.03)
Troponin I: 0.03 ng/mL (ref <0.03)

## 2018-09-04 LAB — DIGOXIN LEVEL: Digoxin Level: 0.5 ng/mL — ABNORMAL LOW (ref 0.8–2.0)

## 2018-09-04 MED ORDER — POTASSIUM CHLORIDE CRYS ER 20 MEQ PO TBCR
40.0000 meq | EXTENDED_RELEASE_TABLET | Freq: Once | ORAL | Status: AC
  Administered 2018-09-04: 40 meq via ORAL

## 2018-09-04 NOTE — Assessment & Plan Note (Signed)
EKG shows T-wave changes and widening QRSs. Given known CVD, recent NSTEMI and persistent chest pain- will send her to ER for evaluation and troponins. Seeing Dr. Mariah Milling Monday

## 2018-09-04 NOTE — Progress Notes (Signed)
BP 100/68 (BP Location: Right Arm, Cuff Size: Normal)   Pulse 76   Temp 97.6 F (36.4 C) (Oral)   Ht 5\' 5"  (1.651 m)   Wt 204 lb 12.8 oz (92.9 kg)   SpO2 96%   BMI 34.08 kg/m    Subjective:    Patient ID: Bonnie Ochoa, female    DOB: Jun 18, 1966, 53 y.o.   MRN: [id number]  HPI: Bonnie Ochoa is a 53 y.o. female  Chief Complaint  Patient presents with  . Congestive Heart Failure  . Diabetes  . Medication Refill    Metoclopramide and Famotidine   CHEST PAIN  Duration:weeks- hurting yesterday Onset: sudden Quality: sharp pain, "pain" Severity: severe Location: upper sternum Radiation: left arm Episode duration: few minutes Frequency: intermittent Related to exertion: no Activity when pain started: when laying down or sitting Trauma: no Anxiety/recent stressors: yes Status: fluctuating Treatments attempted: nitroglycerin   Current pain status: pain free Shortness of breath: no Cough: no Nausea: no Diaphoresis: no Heartburn: no Palpitations: no  DIABETES-  Hypoglycemic episodes:no Polydipsia/polyuria: no Visual disturbance: no Chest pain: no Paresthesias: no Glucose Monitoring: yes  Accucheck frequency: Daily  Fasting glucose: low 200s Taking Insulin?: yes Blood Pressure Monitoring: not checking Retinal Examination: Up to Date Foot Exam: Up to Date Diabetic Education: Completed Pneumovax: Up to Date Influenza: Up to Date Aspirin: yes   Relevant past medical, surgical, family and social history reviewed and updated as indicated. Interim medical history since our last visit reviewed. Allergies and medications reviewed and updated.  Review of Systems  Constitutional: Negative.   Respiratory: Negative.   Cardiovascular: Positive for chest pain. Negative for palpitations and leg swelling.  Gastrointestinal: Negative.   Neurological: Negative.   Psychiatric/Behavioral: Negative.     Per HPI unless specifically indicated above     Objective:      BP 100/68 (BP Location: Right Arm, Cuff Size: Normal)   Pulse 76   Temp 97.6 F (36.4 C) (Oral)   Ht 5\' 5"  (1.651 m)   Wt 204 lb 12.8 oz (92.9 kg)   SpO2 96%   BMI 34.08 kg/m   Wt Readings from Last 3 Encounters:  09/04/18 204 lb 12.8 oz (92.9 kg)  08/27/18 199 lb (90.3 kg)  08/25/18 201 lb 9.6 oz (91.4 kg)    Physical Exam Vitals signs and nursing note reviewed.  Constitutional:      General: She is not in acute distress.    Appearance: Normal appearance. She is not ill-appearing, toxic-appearing or diaphoretic.  HENT:     Head: Normocephalic and atraumatic.     Right Ear: External ear normal.     Left Ear: External ear normal.     Nose: Nose normal.     Mouth/Throat:     Mouth: Mucous membranes are moist.     Pharynx: Oropharynx is clear.  Eyes:     General: No scleral icterus.       Right eye: No discharge.        Left eye: No discharge.     Extraocular Movements: Extraocular movements intact.     Conjunctiva/sclera: Conjunctivae normal.     Pupils: Pupils are equal, round, and reactive to light.  Neck:     Musculoskeletal: Normal range of motion and neck supple.  Cardiovascular:     Rate and Rhythm: Normal rate and regular rhythm.     Pulses: Normal pulses.     Heart sounds: Normal heart sounds. No murmur. No friction rub. No gallop.  Pulmonary:     Effort: Pulmonary effort is normal. No respiratory distress.     Breath sounds: Normal breath sounds. No stridor. No wheezing, rhonchi or rales.  Chest:     Chest wall: No tenderness.  Musculoskeletal: Normal range of motion.  Skin:    General: Skin is warm and dry.     Capillary Refill: Capillary refill takes less than 2 seconds.     Coloration: Skin is not jaundiced or pale.     Findings: No bruising, erythema, lesion or rash.  Neurological:     General: No focal deficit present.     Mental Status: She is alert and oriented to person, place, and time. Mental status is at baseline.  Psychiatric:        Mood  and Affect: Mood normal.        Behavior: Behavior normal.        Thought Content: Thought content normal.        Judgment: Judgment normal.     Results for orders placed or performed during the hospital encounter of 08/25/18  Basic Metabolic Panel (BMET)  Result Value Ref Range   Sodium 136 135 - 145 mmol/L   Potassium 3.6 3.5 - 5.1 mmol/L   Chloride 100 98 - 111 mmol/L   CO2 25 22 - 32 mmol/L   Glucose, Bld 217 (H) 70 - 99 mg/dL   BUN 10 6 - 20 mg/dL   Creatinine, Ser 9.24 (H) 0.44 - 1.00 mg/dL   Calcium 9.5 8.9 - 26.8 mg/dL   GFR calc non Af Amer 59 (L) >60 mL/min   GFR calc Af Amer >60 >60 mL/min   Anion gap 11 5 - 15  Digoxin level  Result Value Ref Range   Digoxin Level 0.4 (L) 0.8 - 2.0 ng/mL      Assessment & Plan:   Problem List Items Addressed This Visit      Cardiovascular and Mediastinum   HTN (hypertension) (Chronic)    BP low today- better after fluids. Asymptomatic. Continue fluids. Call with any concerns. Seeing cardiology in 4 days.       Unstable angina (HCC)    EKG shows T-wave changes and widening QRSs. Given known CVD and persistent chest pain- will send her to ER for evaluation and troponins. Due to follow up with cardiology on Monday.        Endocrine   DM (diabetes mellitus), type 2 with renal complications (HCC)    Sugars are still up, but doing a little better on jardiance. Tolerating it well. Due for A1c again in 2 months. Follow up then.         Other   Chest pain - Primary    EKG shows T-wave changes and widening QRSs. Given known CVD, recent NSTEMI and persistent chest pain- will send her to ER for evaluation and troponins. Seeing Dr. Mariah Milling Monday      Relevant Orders   EKG 12-Lead (Completed)       Follow up plan: Return in about 2 months (around 11/03/2018) for DM follow up.

## 2018-09-04 NOTE — Assessment & Plan Note (Signed)
BP low today- better after fluids. Asymptomatic. Continue fluids. Call with any concerns. Seeing cardiology in 4 days.

## 2018-09-04 NOTE — Assessment & Plan Note (Signed)
Sugars are still up, but doing a little better on jardiance. Tolerating it well. Due for A1c again in 2 months. Follow up then.

## 2018-09-04 NOTE — ED Notes (Signed)
NAD noted at time of D/C. Pt denies questions or concerns. Pt ambulatory to the lobby at this time.  

## 2018-09-04 NOTE — ED Provider Notes (Signed)
[facility] Emergency Department Provider Note   ____________________________________________   First MD Initiated Contact with Patient 09/04/18 1014     (approximate)  I have reviewed the triage vital signs and the nursing notes.   HISTORY  Chief Complaint Chest Pain  HPI Bonnie Ochoa is a 53 y.o. female who reports she is having left-sided upper chest pain.  It comes and goes last about 2 minutes does not seem to be brought on by exertion.  It can come on with rest as well.  Sometimes it radiates into the left axilla.  This is similar to the pain she has had in the past but now it is more often.  She gets short of breath with it and sweaty with it but not nauseated or vomiting with it.  Had a normal cath in October noted she had normal coronary arteries but pulmonary hypertension and abnormal ejection fraction.   Past Medical History:  Diagnosis Date  . Abdominal pain    a. 05/2018 HIDA scan wnl.  . ADHD   . Anemia   . Arthritis   . Asthma   . Bipolar 1 disorder ([place])   . Chest pain    a. Hx of cath in [place] - reportedly nl; b. 04/2018 MV: EF 22%, fixed dist ant septal, apical, and inferoapical defects - ? scar vs. attenuation. No ischemia.  . CHF (congestive heart failure) ([place])   . Cirrhosis of liver ([place])   . Coronary artery disease   . Depression   . Diabetes mellitus without complication ([place])   . Diverticulitis   . Dysrhythmia   . Heart murmur   . HFrEF (heart failure with reduced ejection fraction) ([place])    a. 06/2017 Echo: EF 20-25%, diff HK. Mildly dil LA; b. 05/2018 Echo: EF 25-30%, diff HK, Gr2 DD. Triv AI. Mod MR. Mildly reduced RV fxn. Mod-Sev TR. PASP 45-25mHg.  .Marland KitchenHypertension   . Hypothyroidism   . IBS (irritable bowel syndrome)   . Insomnia   . Migraines   . NICM (nonischemic cardiomyopathy) ([place]    a. EF prev 25%; b. 11/2014 s/p SJM Fortify Assura, single lead AICD (ser# [id number]; c. 06/2017 Echo: EF 20-25%; d. 05/2018 Echo: EF  25-30%, diff HK, Gr2 DD; e. 05/2018 Cath: Nl cors. LVEDP 254mg, PCWP 3263m. PA 65/40 (52). CO/CI 3.04/1.56.  . OSA (obstructive sleep apnea)   . PTSD (post-traumatic stress disorder)   . Restless leg syndrome   . Sleep apnea   . Vertigo     Patient Active Problem List   Diagnosis Date Noted  . NSTEMI (non-ST elevated myocardial infarction) (HCC[place] Mind[place] . Unstable angina (HCCWarren0/26/2019  . Abnormal liver enzymes   . Acute cholecystitis 05/26/2018  . Abdominal pain, right upper quadrant   . Hyperbilirubinemia   . Chest pain 05/08/2018  . Acute renal failure (ARF) (HCC[place]9/13/2019  . Hypothyroid 04/30/2017  . HTN (hypertension) 03/11/2017  . Multiple thyroid nodules 01/29/2017  . Chronic systolic heart failure (HCCNew Vienna2/26/2018  . Obstructive sleep apnea 10/08/2016  . Noncompliance 09/18/2016  . Cirrhosis (HCCLatt[place]2/01/2017  . Pulmonary hypertension (HCC[place]neb[place]0/18/2017  . Hypokalemia 05/30/2016  . NICM (nonischemic cardiomyopathy) (HCCLog[place]0/18/2017  . Other ascites   . Benign hypertensive renal disease   . DM (diabetes mellitus), type 2 with renal complications (HCCVega Alta . Bipolar 1 disorder (HCCR[place] . Asthma   . Generalized abdominal pain 05/07/2016    Past Surgical History:  Procedure Laterality  Date  . ABDOMINAL HYSTERECTOMY    . CARDIAC CATHETERIZATION    . CORONARY ARTERY BYPASS GRAFT    . debribalator  2016  . INSERT / REPLACE / REMOVE PACEMAKER    . INSERTION OF ICD    . RIGHT/LEFT HEART CATH AND CORONARY ANGIOGRAPHY N/A 06/09/2018   Procedure: RIGHT/LEFT HEART CATH AND CORONARY ANGIOGRAPHY;  Surgeon: Wellington Hampshire, MD;  Location: [facility];  Service: Cardiovascular;  Laterality: N/A;  . TONSILLECTOMY    . TONSILLECTOMY    . TONSILLECTOMY    . TUBAL LIGATION  1980  . TUBAL LIGATION      Prior to Admission medications   Medication Sig Start Date End Date Taking? Authorizing Provider  blood glucose meter kit and supplies KIT Dispense based on patient and  insurance preference. Use up to four times daily as directed. (FOR ICD-9 250.00, 250.01). 07/24/17  Yes Johnson, Megan P, DO  carvedilol (COREG) 12.5 MG tablet Take 1 tablet (12.5 mg total) by mouth 2 (two) times daily. 06/03/18 09/04/18 Yes Theora Gianotti, NP  cyclobenzaprine (FLEXERIL) 5 MG tablet Take 5 mg by mouth 3 (three) times daily as needed for muscle spasms.   Yes [provider]  dicyclomine (BENTYL) 20 MG tablet TAKE 1 TABLET (20 MG TOTAL) BY MOUTH 3 (THREE) TIMES DAILY AS NEEDED FOR SPASMS. 07/31/18  Yes Johnson, Megan P, DO  digoxin (LANOXIN) 0.125 MG tablet Take 1 tablet (0.125 mg total) by mouth daily. 07/14/18  Yes Clegg, Amy D, NP  empagliflozin (JARDIANCE) 25 MG TABS tablet Take 25 mg by mouth daily. 07/30/18  Yes Johnson, Megan P, DO  furosemide (LASIX) 40 MG tablet Take 1 tablet (40 mg total) by mouth daily. 06/24/18  Yes Theora Gianotti, NP  Insulin Glargine (LANTUS SOLOSTAR) 100 UNIT/ML Solostar Pen Inject 20 Units into the skin daily at 10 pm. 04/27/18  Yes Mody, Sital, MD  Insulin Pen Needle 32G X 6 MM MISC 1 each by Does not apply route daily. 04/07/18  Yes Johnson, Megan P, DO  lamoTRIgine (LAMICTAL) 25 MG tablet Take 1 tablet (25 mg total) by mouth 2 (two) times daily. 07/30/18  Yes [name], MD  metoCLOPramide (REGLAN) 10 MG tablet Take 1 tablet (10 mg total) by mouth 3 (three) times daily with meals. 07/30/18 07/30/19 Yes Johnson, Megan P, DO  nitroGLYCERIN (NITROSTAT) 0.4 MG SL tablet Place 1 tablet (0.4 mg total) under the tongue every 5 (five) minutes as needed for chest pain. Patient taking differently: Place 0.4 mg under the tongue every 5 (five) minutes as needed for chest pain. Chest pain. 05/19/18 05/19/19 Yes [name], MD  pantoprazole (PROTONIX) 40 MG tablet Take 1 tablet (40 mg total) by mouth daily. 08/28/17 09/04/18 Yes [name], MD  PARoxetine (PAXIL) 30 MG tablet Take 1 tablet (30 mg total) by mouth daily. 07/30/18  Yes  [name], MD  potassium chloride SA (K-DUR,KLOR-CON) 20 MEQ tablet Take 1 tablet (20 mEq total) by mouth daily. 07/14/18  Yes Clegg, Amy D, NP  QUEtiapine (SEROQUEL) 300 MG tablet Take 1 tablet (300 mg total) by mouth at bedtime. 07/30/18  Yes Eappen, Ria Clock, MD  sacubitril-valsartan (ENTRESTO) 49-51 MG Take 1 tablet by mouth 2 (two) times daily. 08/25/18  Yes Clegg, Amy D, NP  spironolactone (ALDACTONE) 25 MG tablet Take 1 tablet (25 mg total) by mouth daily. 07/14/18 10/12/18 Yes Clegg, Amy D, NP  famotidine (PEPCID) 40 MG tablet Take 1 tablet (40 mg total) by  mouth every evening. Patient not taking: Reported on 09/04/2018 04/25/18 04/25/19  Park Liter P, DO  sucralfate (CARAFATE) 1 g tablet TAKE 1 TABLET (1 G TOTAL) BY MOUTH 4 (FOUR) TIMES DAILY. Patient not taking: Reported on 09/04/2018 07/07/18   Valerie Roys, DO    Allergies Levothyroxine  Family History  Problem Relation Age of Onset  . Hypertension Mother   . Hyperlipidemia Mother   . Heart disease Mother   . Hypertension Sister   . Asthma Sister   . Heart disease Sister   . Diabetes Sister   . Cancer Sister   . Alzheimer's disease Maternal Grandfather   . Hyperlipidemia Brother   . Asthma Sister   . Hypertension Sister   . Diabetes Sister     Social History Social History   Tobacco Use  . Smoking status: Former Smoker    Types: Cigarettes  . Smokeless tobacco: Never Used  . Tobacco comment: quit about 20 years ago   Substance Use Topics  . Alcohol use: Not Currently  . Drug use: Not Currently    Frequency: 1.0 times per week    Types: Marijuana    Review of Systems  Constitutional: No fever/chills Eyes: No visual changes. ENT: No sore throat. Cardiovascular: See HPI Respiratory: See HPI. Gastrointestinal: No abdominal pain.  No nausea, no vomiting.  No diarrhea.  No constipation. Genitourinary: Negative for dysuria. Musculoskeletal: Negative for back pain. Skin: Negative for  rash. Neurological: Negative for headaches, focal weakness  ____________________________________________   PHYSICAL EXAM:  VITAL SIGNS: ED Triage Vitals  Enc Vitals Group     BP 09/04/18 1016 119/75     Pulse Rate 09/04/18 1016 74     Resp 09/04/18 1016 16     Temp 09/04/18 1016 (!) 97.5 F (36.4 C)     Temp Source 09/04/18 1016 Oral     SpO2 09/04/18 1016 97 %     Weight 09/04/18 1011 198 lb (89.8 kg)     Height 09/04/18 1011 '5\' 5"'$  (1.651 m)     Head Circumference --      Peak Flow --      Pain Score 09/04/18 1011 10     Pain Loc --      Pain Edu? --      Excl. in [place]? --     Constitutional: Alert and oriented. Well appearing and in no acute distress. Eyes: Conjunctivae are normal.  Head: Atraumatic. Nose: No congestion/rhinnorhea. Mouth/Throat: Mucous membranes are moist.  Oropharynx non-erythematous. Neck: No stridor.   Cardiovascular: Normal rate, regular rhythm. Grossly normal heart sounds.  Good peripheral circulation.  Left chest is tender to palpation but this pain does not reproduce her regular pain. Respiratory: Normal respiratory effort.  No retractions. Lungs CTAB. Gastrointestinal: Soft and nontender. No distention. No abdominal bruits. No CVA tenderness. Musculoskeletal: No lower extremity tenderness nor edema. Neurologic:  Normal speech and language. No gross focal neurologic deficits are appreciated. No gait instability. Skin:  Skin is warm, dry and intact. No rash noted.   ____________________________________________   LABS (all labs ordered are listed, but only abnormal results are displayed)  Labs Reviewed  COMPREHENSIVE METABOLIC PANEL - Abnormal; Notable for the following components:      Result Value   Potassium 3.1 (*)    Glucose, Bld 303 (*)    Creatinine, Ser 1.14 (*)    AST 14 (*)    GFR calc non Af Amer 54 (*)    All other components within  normal limits  DIGOXIN LEVEL - Abnormal; Notable for the following components:   Digoxin Level  0.5 (*)    All other components within normal limits  TROPONIN I - Abnormal; Notable for the following components:   Troponin I 0.04 (*)    All other components within normal limits  TROPONIN I - Abnormal; Notable for the following components:   Troponin I 0.03 (*)    All other components within normal limits  CBC WITH DIFFERENTIAL/PLATELET   ____________________________________________  EKG  EKG read and interpreted by me shows new flipped T waves in 1 and L. ____________________________________________  RADIOLOGY  ED MD interpretation: Wrist x-ray read by radiology reviewed by me shows no acute disease  Official radiology report(s): Dg Chest 2 View  Result Date: 09/04/2018 CLINICAL DATA:  Chest pain with abnormal electrocardiogram EXAM: CHEST - 2 VIEW COMPARISON:  June 07, 2018 FINDINGS: There is mild scarring in the left lower lobe region. The lungs elsewhere are clear. Heart size and pulmonary vascularity are normal. No adenopathy. Pacemaker lead is attached to the right ventricle. There is mild degenerative change in the thoracic spine. No pneumothorax. IMPRESSION: Mild scarring left lower lobe. No edema or consolidation. Stable cardiac silhouette. Electronically Signed   By: Lowella Grip III M.D.   On: 09/04/2018 10:34    ____________________________________________   PROCEDURES  Procedure(s) performed:   Procedures  Critical Care performed:   ____________________________________________   INITIAL IMPRESSION / ASSESSMENT AND PLAN / ED COURSE  Discussed patient at length with Dr. Rockey Situ reviewed her old findings in detail.  We will give her some potassium here.  She does not feel like she has increased fluid at this point so she does not want to take any extra Lasix.  This is okay with me.  I told her if she does feel like she gets extra fluid she can take 2 Lasix and to potassium she says she has all her medicines at home.  We will follow-up with Dr. Rockey Situ  chart has an appointment on the 27th.  This should be okay.  She will return if she has any further problems.         ____________________________________________   FINAL CLINICAL IMPRESSION(S) / ED DIAGNOSES  Final diagnoses:  Nonspecific chest pain     ED Discharge Orders    None       Note:  This document was prepared using Dragon voice recognition software and may include unintentional dictation errors.    Nena Polio, MD 09/04/18 [phone]

## 2018-09-04 NOTE — Telephone Encounter (Signed)
I attempted to call the patient with Dr. Windell Hummingbird recommendations. No answer & her voice mail box is currently full. We will attempt to call back at a later time.

## 2018-09-04 NOTE — ED Notes (Signed)
Pt denies any needs at this time. Lights dimmed for patient comfort. Will continue to monitor for further patient needs.

## 2018-09-04 NOTE — Assessment & Plan Note (Addendum)
EKG shows T-wave changes and widening QRSs. Given known CVD and persistent chest pain- will send her to ER for evaluation and troponins. Due to follow up with cardiology on Monday.

## 2018-09-04 NOTE — ED Triage Notes (Signed)
Pt sent by PCP for chest pain for several weeks with abnormal EKG - pain radiates under left arm - reports shortness of breath and dizziness - denies N/V

## 2018-09-04 NOTE — Discharge Instructions (Addendum)
The test here today were negative.  Please give Dr. Windell Hummingbird office a call and arrange follow-up with him in the next few days.  Please return here if you get any worse.

## 2018-09-04 NOTE — ED Notes (Signed)
Pt up to the bathroom at this time.

## 2018-09-04 NOTE — ED Notes (Signed)
Date and time results received: 09/04/18 11:20 AM  (use smartphrase ".now" to insert current time)  Test: Trop Critical Value: 0.04  Name of Provider Notified: Dr. Darnelle Catalan  Orders Received? Or Actions Taken?: Orders Received - See Orders for details and Actions Taken: Repeat Trop at 1230

## 2018-09-05 ENCOUNTER — Other Ambulatory Visit (HOSPITAL_COMMUNITY): Payer: Medicare Other

## 2018-09-05 NOTE — Telephone Encounter (Signed)
I spoke with the patient. She is aware we will cancel her appointment for 09/08/18 with Dr. Mariah Milling and he can see her back after she is released from the CHF clinic/ when the CHF team feels he needs to see her back.   The patient voices understanding and is agreeable.  Appt for 09/08/18 with Dr. Mariah Milling has been cancelled.

## 2018-09-08 ENCOUNTER — Ambulatory Visit: Payer: Medicare Other | Admitting: Cardiovascular Disease

## 2018-09-08 ENCOUNTER — Other Ambulatory Visit: Payer: Self-pay | Admitting: Family Medicine

## 2018-09-08 ENCOUNTER — Telehealth (HOSPITAL_COMMUNITY): Payer: Self-pay

## 2018-09-08 NOTE — Telephone Encounter (Signed)
Attempted to contact [facility] no answer.  Spoke with her sister and asked about appt with Dr Mariah Milling today and she advised they had cancelled due to transportation issues.  She states she is doing better and that she was out of her heart burn medication and they did not know.  They got her refill for it and since she has not had any chest pain.  Will attempt to contact [facility] later in week for home visit.   Earmon Phoenix [place] EMT-Paramedic [phone]

## 2018-09-12 ENCOUNTER — Telehealth (HOSPITAL_COMMUNITY): Payer: Self-pay | Admitting: Cardiology

## 2018-09-12 ENCOUNTER — Ambulatory Visit (HOSPITAL_COMMUNITY)
Admission: RE | Admit: 2018-09-12 | Discharge: 2018-09-12 | Disposition: A | Discharge status: Home / Self Care | Payer: Medicare Other | Source: Ambulatory Visit | Attending: Cardiology | Admitting: Cardiology

## 2018-09-12 DIAGNOSIS — I5022 Chronic systolic (congestive) heart failure: Secondary | ICD-10-CM | POA: Insufficient documentation

## 2018-09-12 LAB — BASIC METABOLIC PANEL
Anion gap: 11 (ref 5–15)
BUN: 15 mg/dL (ref 6–20)
CHLORIDE: 102 mmol/L (ref 98–111)
CO2: 24 mmol/L (ref 22–32)
Calcium: 9.5 mg/dL (ref 8.9–10.3)
Creatinine, Ser: 1.21 mg/dL — ABNORMAL HIGH (ref 0.44–1.00)
GFR calc Af Amer: 59 mL/min — ABNORMAL LOW (ref >60)
GFR calc non Af Amer: 51 mL/min — ABNORMAL LOW (ref >60)
Glucose, Bld: 270 mg/dL — ABNORMAL HIGH (ref 70–99)
Potassium: 3 mmol/L — ABNORMAL LOW (ref 3.5–5.1)
Sodium: 137 mmol/L (ref 135–145)

## 2018-09-12 MED ORDER — POTASSIUM CHLORIDE CRYS ER 20 MEQ PO TBCR: 40.0000 meq | EXTENDED_RELEASE_TABLET | Freq: Two times a day (BID) | ORAL | 3 refills | Status: DC

## 2018-09-12 NOTE — Telephone Encounter (Signed)
Notes recorded by Theresia Bough, CMA on 09/12/2018 at 2:19 PM EST Patient aware. Patient voiced understanding-  [phone] (H) ------  Notes recorded by Sherald Hess, NP on 09/12/2018 at 1:33 PM EST Renal function stable. Increase potassium to 40 meq twice a day. Please call

## 2018-09-15 NOTE — Progress Notes (Signed)
No ICM remote transmission received for 09/11/2018 and next ICM transmission scheduled for 09/23/2018.   

## 2018-09-16 ENCOUNTER — Encounter (HOSPITAL_COMMUNITY): Payer: Self-pay

## 2018-09-16 ENCOUNTER — Other Ambulatory Visit (HOSPITAL_COMMUNITY): Payer: Self-pay

## 2018-09-16 NOTE — Progress Notes (Signed)
Bgl: 392 bp: 150/90, pulse: 90, resp: 18, spo2: 95 last weight: 199, todays: 197  Today Chantina states she has been feeling good.  She states chest pain are not as bad and not as frequent.  She states exercises.  She states does not check her blood sugar, she started new meds a couple of weeks ago,  Sugar is 392 today.  She has not ate, used a teaspoon of sugar in coffee.  Advised her she needs to check her sugar 3 times daily and write it down to show her doctor.  Explained importance of getting her sugars under control.  Advised her about getting in with endocrinologist for more help.  If sugar remains high she needs to contact her PCP and advise them.  Lungs are clear, denies chest pain, shortness of breath, headaches or dizziness.  Meds verified.  She states was drinking a lot of juice but now has switched to water.  She tries to watch her diet, her spouse cooks and takes care of meds for her.  No swelling in lower extremity and abdomen does not feel tight.  Will continue to visit for heart failure.   Earmon Phoenix [place] EMT-Paramedic [phone]

## 2018-09-17 ENCOUNTER — Other Ambulatory Visit: Payer: Self-pay | Admitting: Gastroenterology

## 2018-09-18 ENCOUNTER — Ambulatory Visit (HOSPITAL_COMMUNITY)
Admission: RE | Admit: 2018-09-18 | Discharge: 2018-09-18 | Disposition: A | Discharge status: Home / Self Care | Payer: Medicare Other | Source: Ambulatory Visit | Attending: Internal Medicine | Admitting: Internal Medicine

## 2018-09-18 ENCOUNTER — Encounter (HOSPITAL_COMMUNITY): Payer: Self-pay | Admitting: Internal Medicine

## 2018-09-18 VITALS — BP 100/76 | HR 93 | Wt 198.4 lb

## 2018-09-18 DIAGNOSIS — F319 Bipolar disorder, unspecified: Secondary | ICD-10-CM | POA: Insufficient documentation

## 2018-09-18 DIAGNOSIS — K589 Irritable bowel syndrome without diarrhea: Secondary | ICD-10-CM | POA: Insufficient documentation

## 2018-09-18 DIAGNOSIS — I081 Rheumatic disorders of both mitral and tricuspid valves: Secondary | ICD-10-CM | POA: Diagnosis not present

## 2018-09-18 DIAGNOSIS — Z87891 Personal history of nicotine dependence: Secondary | ICD-10-CM | POA: Diagnosis not present

## 2018-09-18 DIAGNOSIS — E876 Hypokalemia: Secondary | ICD-10-CM | POA: Insufficient documentation

## 2018-09-18 DIAGNOSIS — G4733 Obstructive sleep apnea (adult) (pediatric): Secondary | ICD-10-CM | POA: Diagnosis not present

## 2018-09-18 DIAGNOSIS — I2510 Atherosclerotic heart disease of native coronary artery without angina pectoris: Secondary | ICD-10-CM | POA: Diagnosis not present

## 2018-09-18 DIAGNOSIS — E1122 Type 2 diabetes mellitus with diabetic chronic kidney disease: Secondary | ICD-10-CM | POA: Insufficient documentation

## 2018-09-18 DIAGNOSIS — Z8249 Family history of ischemic heart disease and other diseases of the circulatory system: Secondary | ICD-10-CM | POA: Insufficient documentation

## 2018-09-18 DIAGNOSIS — Z95810 Presence of automatic (implantable) cardiac defibrillator: Secondary | ICD-10-CM | POA: Diagnosis not present

## 2018-09-18 DIAGNOSIS — Z794 Long term (current) use of insulin: Secondary | ICD-10-CM | POA: Insufficient documentation

## 2018-09-18 DIAGNOSIS — J45909 Unspecified asthma, uncomplicated: Secondary | ICD-10-CM | POA: Diagnosis not present

## 2018-09-18 DIAGNOSIS — I5022 Chronic systolic (congestive) heart failure: Secondary | ICD-10-CM | POA: Diagnosis not present

## 2018-09-18 DIAGNOSIS — Z6833 Body mass index (BMI) 33.0-33.9, adult: Secondary | ICD-10-CM | POA: Insufficient documentation

## 2018-09-18 DIAGNOSIS — I130 Hypertensive heart and chronic kidney disease with heart failure and stage 1 through stage 4 chronic kidney disease, or unspecified chronic kidney disease: Secondary | ICD-10-CM | POA: Insufficient documentation

## 2018-09-18 DIAGNOSIS — I10 Essential (primary) hypertension: Secondary | ICD-10-CM

## 2018-09-18 DIAGNOSIS — N182 Chronic kidney disease, stage 2 (mild): Secondary | ICD-10-CM | POA: Insufficient documentation

## 2018-09-18 DIAGNOSIS — I313 Pericardial effusion (noninflammatory): Secondary | ICD-10-CM | POA: Insufficient documentation

## 2018-09-18 DIAGNOSIS — Z833 Family history of diabetes mellitus: Secondary | ICD-10-CM | POA: Diagnosis not present

## 2018-09-18 DIAGNOSIS — I5042 Chronic combined systolic (congestive) and diastolic (congestive) heart failure: Secondary | ICD-10-CM | POA: Insufficient documentation

## 2018-09-18 DIAGNOSIS — Z79899 Other long term (current) drug therapy: Secondary | ICD-10-CM | POA: Diagnosis not present

## 2018-09-18 DIAGNOSIS — E669 Obesity, unspecified: Secondary | ICD-10-CM | POA: Insufficient documentation

## 2018-09-18 DIAGNOSIS — K7460 Unspecified cirrhosis of liver: Secondary | ICD-10-CM | POA: Insufficient documentation

## 2018-09-18 DIAGNOSIS — Z888 Allergy status to other drugs, medicaments and biological substances status: Secondary | ICD-10-CM | POA: Insufficient documentation

## 2018-09-18 DIAGNOSIS — I428 Other cardiomyopathies: Secondary | ICD-10-CM | POA: Diagnosis not present

## 2018-09-18 LAB — BASIC METABOLIC PANEL
Anion gap: 15 (ref 5–15)
BUN: 22 mg/dL — ABNORMAL HIGH (ref 6–20)
CO2: 20 mmol/L — ABNORMAL LOW (ref 22–32)
Calcium: 9.7 mg/dL (ref 8.9–10.3)
Chloride: 100 mmol/L (ref 98–111)
Creatinine, Ser: 1.28 mg/dL — ABNORMAL HIGH (ref 0.44–1.00)
GFR calc Af Amer: 55 mL/min — ABNORMAL LOW (ref >60)
GFR calc non Af Amer: 47 mL/min — ABNORMAL LOW (ref >60)
Glucose, Bld: 284 mg/dL — ABNORMAL HIGH (ref 70–99)
Potassium: 4.4 mmol/L (ref 3.5–5.1)
Sodium: 135 mmol/L (ref 135–145)

## 2018-09-18 NOTE — Addendum Note (Signed)
Encounter addended by: Nicole Cella, RN on: 09/18/2018 11:23 AM  Actions taken: Charge Capture section accepted, Clinical Note Signed

## 2018-09-18 NOTE — Patient Instructions (Signed)
Lab work done today. We will contact you with any abnormal lab work.  Follow up with Dr. Gala Romney in 2-3 months

## 2018-09-18 NOTE — Progress Notes (Signed)
ADVANCED HF CLINIC  NOTE  Referring Physician: Carmela Rima NP  Primary Care: Dr Rosita Kea Primary Cardiologist: Dr Rockey Situ  Nephrology: Dr Billey Chang HF MD: Dr Haroldine Laws  HPI: Bonnie Ochoa was being referred to the [facility] at the request of [name] NP for heart failure consultation.   Bonnie Ochoa is a 53 year old with a history of NICM, chronic systolic heart failure,St Jude single chamber ICD,  DMII, HTN, bipolar disorder, OSA, PTSD, depression, cataract right eye, thyroid nodules, former heavy smoker, and cirrhosis. Initially diagnosed with HF in 2008 while living in [place]. In 2017 she relocated to [place] to be closer to her sister.   She has had multiple hospital admissions and ED visits at [facility] over the last 3 months for a/c systolic heart failure.    Admitted to [facility] in June 07, 2018 with chest pain and shortness of breath. Diuresed with IV lasix and later underwent RHC/LHC. She has elevated filling pressures and lowe cardiac index was 1.5. Since discharge entresto was started on 06/20/2018.   Today she returns for HF follow up. Overall feeling much better. Does ADLs without a problem. No longer using the electric cart at the store. No edema, orthopnea or PND. Says she isn't SOB. Weight stable 196-197. Not taking extra lasix.    CorVue : Impendance down. No VT  CPX 07/16/2018  Peak VO2: 11.8 (66% predicted peak VO2) VE/VCO2 slope: 37 OUES: 1.12 Peak RER: 1.09 Moderate limitation with moderately elevated VE/VCO2.   Cardiac Test 05/2018 RHC/LHC   Normal coronary arteries. PA 65/40 Mean 52  PCWP 32 PVR 6.6 CO 3.04 CI 1.56.   ECHO 06/08/2018  Left ventricle: The cavity size was severely dilated. Wall   thickness was at the upper limits of normal. Systolic function   was severely reduced. The estimated ejection fraction was in the   range of 25% to 30%. Diffuse hypokinesis. Features are consistent   with a pseudonormal left ventricular filling pattern,  with   concomitant abnormal relaxation and increased filling pressure   (grade 2 diastolic dysfunction). Doppler parameters are   consistent with high ventricular filling pressure. - Aortic valve: Trileaflet; mildly thickened, mildly calcified   leaflets. There was trivial regurgitation. - Mitral valve: There was at least moderate regurgitation. - Left atrium: The atrium was mildly dilated. - Right ventricle: The cavity size was mildly dilated. Wall   thickness was normal. Systolic function was mildly reduced. - Right atrium: The atrium was mildly dilated. - Tricuspid valve: There was moderate-severe regurgitation. - Pulmonary arteries: Systolic pressure was moderately increased,   in the range of 45 mm Hg to 50 mm Hg. - Inferior vena cava: The vessel was dilated. The respirophasic   diameter changes were blunted (< 50%), consistent with elevated   central venous pressure. - Pericardium, extracardiac: A small pericardial effusion was   identified.    Past Medical History:  Diagnosis Date  . Abdominal pain    a. 05/2018 HIDA scan wnl.  . ADHD   . Anemia   . Arthritis   . Asthma   . Bipolar 1 disorder ([place])   . Chest pain    a. Hx of cath in [place] - reportedly nl; b. 04/2018 MV: EF 22%, fixed dist ant septal, apical, and inferoapical defects - ? scar vs. attenuation. No ischemia.  . CHF (congestive heart failure) ([place])   . Cirrhosis of liver ([place])   . Coronary artery disease   . Depression   .  Diabetes mellitus without complication ([facility])   . Diverticulitis   . Dysrhythmia   . Heart murmur   . HFrEF (heart failure with reduced ejection fraction) ([place])    a. 06/2017 Echo: EF 20-25%, diff HK. Mildly dil LA; b. 05/2018 Echo: EF 25-30%, diff HK, Gr2 DD. Triv AI. Mod MR. Mildly reduced RV fxn. Mod-Sev TR. PASP 45-6mHg.  .Marland KitchenHypertension   . Hypothyroidism   . IBS (irritable bowel syndrome)   . Insomnia   . Migraines   . NICM (nonischemic cardiomyopathy) ([place]    a. EF prev 25%;  b. 11/2014 s/p SJM Fortify Assura, single lead AICD (ser# [id number]; c. 06/2017 Echo: EF 20-25%; d. 05/2018 Echo: EF 25-30%, diff HK, Gr2 DD; e. 05/2018 Cath: Nl cors. LVEDP 258mg, PCWP 3268m. PA 65/40 (52). CO/CI 3.04/1.56.  . OSA (obstructive sleep apnea)   . PTSD (post-traumatic stress disorder)   . Restless leg syndrome   . Sleep apnea   . Vertigo     Current Outpatient Medications  Medication Sig Dispense Refill  . blood glucose meter kit and supplies KIT Dispense based on patient and insurance preference. Use up to four times daily as directed. (FOR ICD-9 250.00, 250.01). 1 each 0  . carvedilol (COREG) 12.5 MG tablet Take 1 tablet (12.5 mg total) by mouth 2 (two) times daily. 180 tablet 3  . cyclobenzaprine (FLEXERIL) 5 MG tablet Take 5 mg by mouth 3 (three) times daily as needed for muscle spasms.    . dMarland Kitchencyclomine (BENTYL) 20 MG tablet TAKE 1 TABLET (20 MG TOTAL) BY MOUTH 3 (THREE) TIMES DAILY AS NEEDED FOR SPASMS. 270 tablet 0  . digoxin (LANOXIN) 0.125 MG tablet Take 1 tablet (0.125 mg total) by mouth daily. 90 tablet 3  . empagliflozin (JARDIANCE) 25 MG TABS tablet Take 25 mg by mouth daily. 30 tablet 3  . famotidine (PEPCID) 40 MG tablet TAKE 1 TABLET BY MOUTH EVERY DAY IN THE EVENING 90 tablet 0  . furosemide (LASIX) 40 MG tablet Take 1 tablet (40 mg total) by mouth daily. 30 tablet 6  . Insulin Glargine (LANTUS SOLOSTAR) 100 UNIT/ML Solostar Pen Inject 20 Units into the skin daily at 10 pm. 5 pen 12  . Insulin Pen Needle 32G X 6 MM MISC 1 each by Does not apply route daily. 100 each 12  . lamoTRIgine (LAMICTAL) 25 MG tablet Take 1 tablet (25 mg total) by mouth 2 (two) times daily. 180 tablet 1  . metoCLOPramide (REGLAN) 10 MG tablet Take 1 tablet (10 mg total) by mouth 3 (three) times daily with meals. 90 tablet 1  . nitroGLYCERIN (NITROSTAT) 0.4 MG SL tablet Place 1 tablet (0.4 mg total) under the tongue every 5 (five) minutes as needed for chest pain. (Patient taking differently:  Place 0.4 mg under the tongue every 5 (five) minutes as needed for chest pain. Chest pain.) 30 tablet 0  . pantoprazole (PROTONIX) 40 MG tablet Take 1 tablet (40 mg total) by mouth daily. 30 tablet 11  . PARoxetine (PAXIL) 30 MG tablet Take 1 tablet (30 mg total) by mouth daily. 90 tablet 1  . potassium chloride SA (K-DUR,KLOR-CON) 20 MEQ tablet Take 2 tablets (40 mEq total) by mouth 2 (two) times daily. 120 tablet 3  . QUEtiapine (SEROQUEL) 300 MG tablet Take 1 tablet (300 mg total) by mouth at bedtime. 90 tablet 1  . sacubitril-valsartan (ENTRESTO) 49-51 MG Take 1 tablet by mouth 2 (two) times daily. 60 tablet 3  . spironolactone (ALDACTONE)  25 MG tablet Take 1 tablet (25 mg total) by mouth daily. 90 tablet 3  . sucralfate (CARAFATE) 1 g tablet TAKE 1 TABLET (1 G TOTAL) BY MOUTH 4 (FOUR) TIMES DAILY. 360 tablet 3   No current facility-administered medications for this encounter.     Allergies  Allergen Reactions  . Levothyroxine Rash      Social History   Socioeconomic History  . Marital status: Married    Spouse name: Not on file  . Number of children: 2  . Years of education: Not on file  . Highest education level: High school graduate  Occupational History    Comment: disabled  Social Needs  . Financial resource strain: Not hard at all  . Food insecurity:    Worry: Sometimes true    Inability: Sometimes true  . Transportation needs:    Medical: Yes    Non-medical: Yes  Tobacco Use  . Smoking status: Former Smoker    Types: Cigarettes  . Smokeless tobacco: Never Used  . Tobacco comment: quit about 20 years ago   Substance and Sexual Activity  . Alcohol use: Not Currently  . Drug use: Not Currently    Frequency: 1.0 times per week    Types: Marijuana  . Sexual activity: Yes    Birth control/protection: Surgical    Comment: one partner  Lifestyle  . Physical activity:    Days per week: 0 days    Minutes per session: 0 min  . Stress: Very much  Relationships  .  Social connections:    Talks on phone: Never    Gets together: Twice a week    Attends religious service: Never    Active member of club or organization: No    Attends meetings of clubs or organizations: Never    Relationship status: Married  . Intimate partner violence:    Fear of current or ex partner: No    Emotionally abused: No    Physically abused: No    Forced sexual activity: No  Other Topics Concern  . Not on file  Social History Narrative   Volunteers occasionally       Family History  Problem Relation Age of Onset  . Hypertension Mother   . Hyperlipidemia Mother   . Heart disease Mother   . Hypertension Sister   . Asthma Sister   . Heart disease Sister   . Diabetes Sister   . Cancer Sister   . Alzheimer's disease Maternal Grandfather   . Hyperlipidemia Brother   . Asthma Sister   . Hypertension Sister   . Diabetes Sister     Vitals:   09/18/18 1050  BP: 100/76  Pulse: 93  SpO2: 98%  Weight: 90 kg (198 lb 6.4 oz)   Wt Readings from Last 3 Encounters:  09/18/18 90 kg (198 lb 6.4 oz)  09/16/18 89.4 kg (197 lb)  09/04/18 89.8 kg (198 lb)    PHYSICAL EXAM: General:  Well appearing. No resp difficulty HEENT: normal Neck: supple. no JVD. Carotids 2+ bilat; no bruits. No lymphadenopathy or thryomegaly appreciated. Cor: PMI nondisplaced. Regular rate & rhythm. No rubs, gallops or murmurs. Lungs: clear Abdomen: obese soft, nontender, nondistended. No hepatosplenomegaly. No bruits or masses. Good bowel sounds. Extremities: no cyanosis, clubbing, rash, edema Neuro: alert & orientedx3, cranial nerves grossly intact. moves all 4 extremities w/o difficulty. Affect pleasant  EKG: SR with occasional PVC 79 bpm QRS 110 ms  ASSESSMENT & PLAN:  1. Chronic Systolic/Diastolic  Heart Failure ,  NICM. St Jude single chamber ICD. 05/2018 RHC/LHC cors normal. Cardiac output 3. Cardiac Index 1.56.  - CPX 07/2018 -slope 37, RER 1.1  VO 2 11.8  - ECHO 06/08/2018 EF  25-30% Grade II DD. - Improved NYHA II-III - Continue spironolactone to 25 mg daily  - Continue carvedilol 12.5 mg twice a day. - Continue digoxin 0.125 mg daily.  - Continue entresto 49-51 mg twice a day. BP too low too titrate - Can consider bidil as BP tolerates - Check BMET today - Briefly discussed advanced therapies such as possible LVAD at last visit. Will repeat CPX later this year.  2. HTN  - BP low today 3. CKD Stage II  - repeat BMET today 4. DMII -On insulin per PCP.  5. Obesity  Body mass index is 33.02 kg/m.  6. Hypokalemia - recheck today  Glori Bickers MD 11:03 AM

## 2018-09-18 NOTE — Addendum Note (Signed)
Encounter addended by: Nicole Cella, RN on: 09/18/2018 11:13 AM  Actions taken: Order list changed, Diagnosis association updated

## 2018-09-19 ENCOUNTER — Ambulatory Visit (INDEPENDENT_AMBULATORY_CARE_PROVIDER_SITE_OTHER): Payer: Medicare Other

## 2018-09-19 DIAGNOSIS — I5022 Chronic systolic (congestive) heart failure: Secondary | ICD-10-CM | POA: Diagnosis not present

## 2018-09-19 DIAGNOSIS — Z95810 Presence of automatic (implantable) cardiac defibrillator: Secondary | ICD-10-CM

## 2018-09-19 NOTE — Progress Notes (Signed)
EPIC Encounter for ICM Monitoring  Patient Name: Bonnie Ochoa is a 53 y.o. female Date: 09/19/2018 Primary Care Physican: Valerie Roys, DO Primary Cardiologist: Gollan/Bensimhon Electrophysiologist: Faustino Congress Weight: 198lbs (office weight 09/18/2018)   Heart failure questions reviewed.  Pt asymptomatic.     Thoracic impedance normal.  Prescribed: Furosemide 40 take 1 tablet a day  Furosemide40 mg 1 tablet (47m total) daily.   Labs: 09/18/2018 Creatinine 1.28, BUN 22, Potassium 4.4, Sodium 135, GFR 47-55 09/12/2018 Creatinine 1.21, BUN 15, Potassium 3.0, Sodium 137, GFR 51-59  09/04/2018 Creatinine 1.14, BUN 19, Potassium 3.1, Sodium 137, GFR 54->60  08/25/2018 Creatinine 1.06, BUN 10, Potassium 3.6, Sodium 136, GFR 59->60  07/30/2018 Creatinine 0.99, BUN 16, Potassium 3.7, Sodium 140, GFR 65-75  07/28/2018 Creatinine 1.15, BUN 13, Potassium 3.6, Sodium 136, GFR 54->60  07/21/2018 Creatinine 1.14, BUN 15, Potassium 4.8, Sodium 136, GFR 54->60  07/14/2018 Creatinine 1.07, BUN 13, Potassium 3.1, Sodium 138, GFR 59->60  07/01/2018 Creatinine 1.18, BUN 16, Potassium 3.9, Sodium 137, eGFR 52-60 06/23/2018 Creatinine 1.07, BUN 12, Potassium 3.9, Sodium 140, eGFR 59-68  06/17/2018 Creatinine 1.14, BUN 17, Potassium 4.1, Sodium 136, eGFR 55-63  06/12/2018 Creatinine 1.34, BUN 27, Potassium 3.7, Sodium 135, eGFR 44-51  06/11/2018 Creatinine 1.16, BUN 20, Potassium 3.8, Sodium 138, eGFR 52->60  06/10/2018 Creatinine 1.11, BUN 18, Potassium 3.7, Sodium 137, eGFR 55->60  A complete set of results can be found in Results Review.  Recommendations:  Unable to reach.  Follow-up plan: ICM clinic phone appointment on 10/21/2018.   Office appointment scheduled 12/19/2018 with Dr BHaroldine Laws     Copy of ICM check sent to Dr. KCaryl Comes   3 month ICM trend: 09/18/2018    1 Year ICM trend:       LRosalene Billings RN 09/19/2018 4:40 PM

## 2018-09-24 ENCOUNTER — Telehealth: Payer: Self-pay | Admitting: Family Medicine

## 2018-09-24 ENCOUNTER — Encounter (HOSPITAL_COMMUNITY): Payer: Self-pay

## 2018-09-24 ENCOUNTER — Other Ambulatory Visit (HOSPITAL_COMMUNITY): Payer: Self-pay

## 2018-09-24 NOTE — Progress Notes (Signed)
Bgl: 415 bp: 102/68, pulse: 82, resp: 18, spo2: 97 last weight: 197 todays: 198  Today Ashwini states she is just tired all the time.  She denies any chest pain in past week.  She states been trying to eat healthy, low sodium and low carbs.  BGL: is 415, advised her to contact her PCP and advise her of her readings.  She is not checking it at home.  Lungs are clear, no extremity swelling.  She stays active, walks on tread mill, walks through stores.  Her bedroom is up stairs, she denies getting short of breath climbing up them.  States she has headaches lately and denies dizziness.  She states eats cereal for breakfast today and that is all she has had.  Yesterday ate cereal, watermelon, steak, broccolli, crescent roll and mashed potatoes.  No Lunch.  Discussed high blood sugar reading can do to the body and kidneys.  She states mostly drinks water, coffee and maybe occasionally a soda.  Has quit drink juices.  Will continue to visit for heart failure and diet.   Earmon Phoenix [place] EMT-Paramedic [phone]

## 2018-09-24 NOTE — Telephone Encounter (Signed)
Patient needs refill on the following sent to [facility] [address]-  Famotidine Metoclopramide   Thank you

## 2018-09-25 NOTE — Telephone Encounter (Signed)
OK to fill

## 2018-09-25 NOTE — Telephone Encounter (Signed)
Severe level one contraindication with paxil, other medication is to early to fill.

## 2018-09-25 NOTE — Telephone Encounter (Signed)
Called and left a VM to let them know that it is fine to fill the medication.

## 2018-09-25 NOTE — Telephone Encounter (Signed)
Not due for either of those medicines. 90 days sent in on famotidine in January. 6 month supply of metoclopramide sent in in December.

## 2018-09-29 DIAGNOSIS — H2589 Other age-related cataract: Secondary | ICD-10-CM | POA: Diagnosis not present

## 2018-10-15 ENCOUNTER — Other Ambulatory Visit (HOSPITAL_COMMUNITY): Payer: Self-pay

## 2018-10-15 ENCOUNTER — Encounter (HOSPITAL_COMMUNITY): Payer: Self-pay

## 2018-10-15 NOTE — Progress Notes (Signed)
Bgl: 133 bp: 100/72, pulse: 88, resp: 18, spo2:  97 had not weighed today  Rannie states she has been sick past 2 weeks, been laying in bed everyday.  She states first day out was yesterday, she states feeling better and going out today.  She states she is very tired everyday.  She states her stomach been messed up.  After talking with her, her cpap broke in December.  Talked with her wife and she is going to get her appt to get her cpap working.  She states been watching her sugars and sodium more lately.  She has not been exercising.  She denies chest pain, she has been having headaches, she denies abdominal tightness.  She has no swelling in extremities.  Meds verified, her wife takes care of her meds.  Will continue to visit for heart failure and diet.   Earmon Phoenix [place] EMT-Paramedic [phone]

## 2018-10-20 ENCOUNTER — Encounter (HOSPITAL_COMMUNITY): Payer: Medicare Other | Admitting: Internal Medicine

## 2018-10-21 ENCOUNTER — Encounter: Payer: Medicare Other | Admitting: *Deleted

## 2018-10-21 ENCOUNTER — Other Ambulatory Visit (HOSPITAL_COMMUNITY): Payer: Self-pay

## 2018-10-21 ENCOUNTER — Encounter (HOSPITAL_COMMUNITY): Payer: Self-pay

## 2018-10-21 NOTE — Progress Notes (Signed)
Bp: 102/69, pulse: 62, resp: 18, spo2: 95 todays weight: 190 lbs, BGL: 173  Today she is up and moving about the house.  She states she has not been sleeping and staying in bed.  She states went out to supper last night at [place], her weight is up a couple pounds today she states.  Advised her a lot of sodium in [place] food.  She states still having problems with her gut, feeling nauseated every time she eats.  She is unsure if her diverticulitis is acting up or what.  Her abdomen is tight and tender.  Advised her she need to contact her GI doctor.  She states she will get her wife to.  She states been watching her sugars and sodium at home.  She has not been exercising yet.  She states her seroquel is not making her sleepy anymore, she takes for ever to fall asleep and gets up around 12 noon.  Meds verified and her wife takes care of her meds for her.  She states she is feeling a little better.  She is unsure if her wife as contacted her pulmonologist about her cpap.  With contact her wife.  Will continue to visit for heart failure, med compliance and diet.   Earmon Phoenix [place] EMT-Paramedic [phone]

## 2018-10-22 ENCOUNTER — Telehealth: Payer: Self-pay

## 2018-10-22 NOTE — Telephone Encounter (Signed)
Left message for patient to remind of missed remote transmission.  

## 2018-10-23 DIAGNOSIS — E1122 Type 2 diabetes mellitus with diabetic chronic kidney disease: Secondary | ICD-10-CM | POA: Diagnosis not present

## 2018-10-23 DIAGNOSIS — N183 Chronic kidney disease, stage 3 (moderate): Secondary | ICD-10-CM | POA: Diagnosis not present

## 2018-10-23 DIAGNOSIS — I5022 Chronic systolic (congestive) heart failure: Secondary | ICD-10-CM | POA: Diagnosis not present

## 2018-10-23 DIAGNOSIS — D631 Anemia in chronic kidney disease: Secondary | ICD-10-CM | POA: Diagnosis not present

## 2018-10-24 ENCOUNTER — Other Ambulatory Visit: Payer: Self-pay | Admitting: Family Medicine

## 2018-10-24 ENCOUNTER — Other Ambulatory Visit: Payer: Self-pay

## 2018-10-24 ENCOUNTER — Inpatient Hospital Stay
Admission: EM | Admit: 2018-10-24 | Discharge: 2018-10-25 | DRG: 683 | Disposition: A | Discharge status: Home / Self Care | Payer: Medicare Other | Attending: Internal Medicine | Admitting: Internal Medicine

## 2018-10-24 ENCOUNTER — Emergency Department: Payer: Medicare Other

## 2018-10-24 DIAGNOSIS — Z87891 Personal history of nicotine dependence: Secondary | ICD-10-CM | POA: Diagnosis not present

## 2018-10-24 DIAGNOSIS — Z79899 Other long term (current) drug therapy: Secondary | ICD-10-CM

## 2018-10-24 DIAGNOSIS — I252 Old myocardial infarction: Secondary | ICD-10-CM

## 2018-10-24 DIAGNOSIS — G2581 Restless legs syndrome: Secondary | ICD-10-CM | POA: Diagnosis present

## 2018-10-24 DIAGNOSIS — I959 Hypotension, unspecified: Secondary | ICD-10-CM | POA: Diagnosis not present

## 2018-10-24 DIAGNOSIS — E1122 Type 2 diabetes mellitus with diabetic chronic kidney disease: Secondary | ICD-10-CM | POA: Diagnosis present

## 2018-10-24 DIAGNOSIS — Z95810 Presence of automatic (implantable) cardiac defibrillator: Secondary | ICD-10-CM | POA: Diagnosis not present

## 2018-10-24 DIAGNOSIS — Z951 Presence of aortocoronary bypass graft: Secondary | ICD-10-CM

## 2018-10-24 DIAGNOSIS — Z833 Family history of diabetes mellitus: Secondary | ICD-10-CM | POA: Diagnosis not present

## 2018-10-24 DIAGNOSIS — E039 Hypothyroidism, unspecified: Secondary | ICD-10-CM | POA: Diagnosis present

## 2018-10-24 DIAGNOSIS — F4310 Post-traumatic stress disorder, unspecified: Secondary | ICD-10-CM | POA: Diagnosis present

## 2018-10-24 DIAGNOSIS — G4733 Obstructive sleep apnea (adult) (pediatric): Secondary | ICD-10-CM | POA: Diagnosis not present

## 2018-10-24 DIAGNOSIS — Z820 Family history of epilepsy and other diseases of the nervous system: Secondary | ICD-10-CM

## 2018-10-24 DIAGNOSIS — R6883 Chills (without fever): Secondary | ICD-10-CM | POA: Diagnosis not present

## 2018-10-24 DIAGNOSIS — Z888 Allergy status to other drugs, medicaments and biological substances status: Secondary | ICD-10-CM

## 2018-10-24 DIAGNOSIS — E875 Hyperkalemia: Secondary | ICD-10-CM | POA: Diagnosis present

## 2018-10-24 DIAGNOSIS — N189 Chronic kidney disease, unspecified: Secondary | ICD-10-CM | POA: Diagnosis present

## 2018-10-24 DIAGNOSIS — R509 Fever, unspecified: Secondary | ICD-10-CM | POA: Diagnosis not present

## 2018-10-24 DIAGNOSIS — I5042 Chronic combined systolic (congestive) and diastolic (congestive) heart failure: Secondary | ICD-10-CM | POA: Diagnosis present

## 2018-10-24 DIAGNOSIS — I2510 Atherosclerotic heart disease of native coronary artery without angina pectoris: Secondary | ICD-10-CM | POA: Diagnosis not present

## 2018-10-24 DIAGNOSIS — Z8249 Family history of ischemic heart disease and other diseases of the circulatory system: Secondary | ICD-10-CM

## 2018-10-24 DIAGNOSIS — F319 Bipolar disorder, unspecified: Secondary | ICD-10-CM | POA: Diagnosis not present

## 2018-10-24 DIAGNOSIS — N183 Chronic kidney disease, stage 3 (moderate): Secondary | ICD-10-CM | POA: Diagnosis not present

## 2018-10-24 DIAGNOSIS — N179 Acute kidney failure, unspecified: Secondary | ICD-10-CM | POA: Diagnosis not present

## 2018-10-24 DIAGNOSIS — Z794 Long term (current) use of insulin: Secondary | ICD-10-CM

## 2018-10-24 DIAGNOSIS — I130 Hypertensive heart and chronic kidney disease with heart failure and stage 1 through stage 4 chronic kidney disease, or unspecified chronic kidney disease: Secondary | ICD-10-CM | POA: Diagnosis not present

## 2018-10-24 DIAGNOSIS — R7989 Other specified abnormal findings of blood chemistry: Secondary | ICD-10-CM | POA: Diagnosis not present

## 2018-10-24 DIAGNOSIS — Z8349 Family history of other endocrine, nutritional and metabolic diseases: Secondary | ICD-10-CM | POA: Diagnosis not present

## 2018-10-24 DIAGNOSIS — E860 Dehydration: Secondary | ICD-10-CM | POA: Diagnosis not present

## 2018-10-24 DIAGNOSIS — Z8261 Family history of arthritis: Secondary | ICD-10-CM

## 2018-10-24 DIAGNOSIS — K589 Irritable bowel syndrome without diarrhea: Secondary | ICD-10-CM | POA: Diagnosis not present

## 2018-10-24 DIAGNOSIS — Z825 Family history of asthma and other chronic lower respiratory diseases: Secondary | ICD-10-CM

## 2018-10-24 DIAGNOSIS — I428 Other cardiomyopathies: Secondary | ICD-10-CM | POA: Diagnosis not present

## 2018-10-24 DIAGNOSIS — K7460 Unspecified cirrhosis of liver: Secondary | ICD-10-CM | POA: Diagnosis present

## 2018-10-24 LAB — CBC
HCT: 30.1 % — ABNORMAL LOW (ref 36.0–46.0)
Hemoglobin: 9.6 g/dL — ABNORMAL LOW (ref 12.0–15.0)
MCH: 29.5 pg (ref 26.0–34.0)
MCHC: 31.9 g/dL (ref 30.0–36.0)
MCV: 92.6 fL (ref 80.0–100.0)
Platelets: 202 10*3/uL (ref 150–400)
RBC: 3.25 MIL/uL — ABNORMAL LOW (ref 3.87–5.11)
RDW: 13.2 % (ref 11.5–15.5)
WBC: 5.6 10*3/uL (ref 4.0–10.5)
nRBC: 0 % (ref 0.0–0.2)

## 2018-10-24 LAB — BASIC METABOLIC PANEL
Anion gap: 11 (ref 5–15)
BUN: 70 mg/dL — AB (ref 6–20)
CO2: 15 mmol/L — ABNORMAL LOW (ref 22–32)
Calcium: 9.4 mg/dL (ref 8.9–10.3)
Chloride: 107 mmol/L (ref 98–111)
Creatinine, Ser: 2.98 mg/dL — ABNORMAL HIGH (ref 0.44–1.00)
GFR calc Af Amer: 20 mL/min — ABNORMAL LOW (ref >60)
GFR calc non Af Amer: 17 mL/min — ABNORMAL LOW (ref >60)
Glucose, Bld: 224 mg/dL — ABNORMAL HIGH (ref 70–99)
Potassium: 5.8 mmol/L — ABNORMAL HIGH (ref 3.5–5.1)
Sodium: 133 mmol/L — ABNORMAL LOW (ref 135–145)

## 2018-10-24 LAB — BRAIN NATRIURETIC PEPTIDE: B Natriuretic Peptide: 49 pg/mL (ref 0.0–100.0)

## 2018-10-24 LAB — INFLUENZA PANEL BY PCR (TYPE A & B)
Influenza A By PCR: NEGATIVE
Influenza B By PCR: NEGATIVE

## 2018-10-24 MED ORDER — SODIUM CHLORIDE 0.9 % IV BOLUS: 500.0000 mL | Freq: Once | INTRAVENOUS | Status: DC

## 2018-10-24 MED ORDER — SODIUM POLYSTYRENE SULFONATE 15 GM/60ML PO SUSP
30.0000 g | Freq: Once | ORAL | Status: AC
  Administered 2018-10-24: 30 g via ORAL
  Filled 2018-10-24: qty 120

## 2018-10-24 NOTE — ED Notes (Signed)
Patient transported to X-ray 

## 2018-10-24 NOTE — Telephone Encounter (Signed)
Requested Prescriptions  Pending Prescriptions Disp Refills  . dicyclomine (BENTYL) 20 MG tablet [Pharmacy Med Name: DICYCLOMINE 20 MG TABLET] 270 tablet 0    Sig: TAKE 1 TABLET (20 MG TOTAL) BY MOUTH 3 (THREE) TIMES DAILY AS NEEDED FOR SPASMS.     Gastroenterology:  Antispasmodic Agents Passed - 10/24/2018 11:05 AM      Passed - Last Heart Rate in normal range    Pulse Readings from Last 1 Encounters:  10/21/18 82         Passed - Valid encounter within last 12 months    Recent Outpatient Visits          1 month ago Chest pain, unspecified type   [facility], Megan P, DO   2 months ago Type 2 diabetes mellitus with stage 3 chronic kidney disease, with long-term current use of insulin (HCC)   [facility] [place], [place], DO   4 months ago Loss of vision   [facility], [place], DO   4 months ago Chest pain, unspecified type   [facility] [place], Megan P, DO   5 months ago Acute renal failure, unspecified acute renal failure type [facility])   [facility] [place], Megan P, DO      Future Appointments            In 5 days Pasty Spillers, MD [facility]   In 4 months  [facility]   In 4 months [place], Oralia Rud, DO [facility]

## 2018-10-24 NOTE — ED Triage Notes (Signed)
Pt states her kidney MD told her to come to ED, states "my kidneys are low and my potassium is too high." A&O, in wheelchair. Is not a dialysis pt.

## 2018-10-25 ENCOUNTER — Other Ambulatory Visit: Payer: Self-pay

## 2018-10-25 DIAGNOSIS — I959 Hypotension, unspecified: Secondary | ICD-10-CM | POA: Diagnosis present

## 2018-10-25 DIAGNOSIS — I252 Old myocardial infarction: Secondary | ICD-10-CM | POA: Diagnosis not present

## 2018-10-25 DIAGNOSIS — F4310 Post-traumatic stress disorder, unspecified: Secondary | ICD-10-CM | POA: Diagnosis present

## 2018-10-25 DIAGNOSIS — E875 Hyperkalemia: Secondary | ICD-10-CM | POA: Diagnosis present

## 2018-10-25 DIAGNOSIS — E119 Type 2 diabetes mellitus without complications: Secondary | ICD-10-CM | POA: Diagnosis not present

## 2018-10-25 DIAGNOSIS — I428 Other cardiomyopathies: Secondary | ICD-10-CM | POA: Diagnosis present

## 2018-10-25 DIAGNOSIS — N179 Acute kidney failure, unspecified: Secondary | ICD-10-CM | POA: Diagnosis present

## 2018-10-25 DIAGNOSIS — N189 Chronic kidney disease, unspecified: Secondary | ICD-10-CM | POA: Diagnosis present

## 2018-10-25 DIAGNOSIS — E039 Hypothyroidism, unspecified: Secondary | ICD-10-CM | POA: Diagnosis present

## 2018-10-25 DIAGNOSIS — E860 Dehydration: Secondary | ICD-10-CM | POA: Diagnosis present

## 2018-10-25 DIAGNOSIS — Z951 Presence of aortocoronary bypass graft: Secondary | ICD-10-CM | POA: Diagnosis not present

## 2018-10-25 DIAGNOSIS — I2510 Atherosclerotic heart disease of native coronary artery without angina pectoris: Secondary | ICD-10-CM | POA: Diagnosis present

## 2018-10-25 DIAGNOSIS — R7989 Other specified abnormal findings of blood chemistry: Secondary | ICD-10-CM | POA: Diagnosis not present

## 2018-10-25 DIAGNOSIS — I5042 Chronic combined systolic (congestive) and diastolic (congestive) heart failure: Secondary | ICD-10-CM | POA: Diagnosis present

## 2018-10-25 DIAGNOSIS — E1122 Type 2 diabetes mellitus with diabetic chronic kidney disease: Secondary | ICD-10-CM | POA: Diagnosis present

## 2018-10-25 DIAGNOSIS — I5022 Chronic systolic (congestive) heart failure: Secondary | ICD-10-CM | POA: Diagnosis not present

## 2018-10-25 DIAGNOSIS — I130 Hypertensive heart and chronic kidney disease with heart failure and stage 1 through stage 4 chronic kidney disease, or unspecified chronic kidney disease: Secondary | ICD-10-CM | POA: Diagnosis present

## 2018-10-25 DIAGNOSIS — Z95810 Presence of automatic (implantable) cardiac defibrillator: Secondary | ICD-10-CM | POA: Diagnosis not present

## 2018-10-25 DIAGNOSIS — K589 Irritable bowel syndrome without diarrhea: Secondary | ICD-10-CM | POA: Diagnosis present

## 2018-10-25 DIAGNOSIS — F319 Bipolar disorder, unspecified: Secondary | ICD-10-CM | POA: Diagnosis present

## 2018-10-25 DIAGNOSIS — N183 Chronic kidney disease, stage 3 (moderate): Secondary | ICD-10-CM | POA: Diagnosis present

## 2018-10-25 DIAGNOSIS — Z8249 Family history of ischemic heart disease and other diseases of the circulatory system: Secondary | ICD-10-CM | POA: Diagnosis not present

## 2018-10-25 DIAGNOSIS — Z8349 Family history of other endocrine, nutritional and metabolic diseases: Secondary | ICD-10-CM | POA: Diagnosis not present

## 2018-10-25 DIAGNOSIS — G4733 Obstructive sleep apnea (adult) (pediatric): Secondary | ICD-10-CM | POA: Diagnosis present

## 2018-10-25 DIAGNOSIS — Z87891 Personal history of nicotine dependence: Secondary | ICD-10-CM | POA: Diagnosis not present

## 2018-10-25 DIAGNOSIS — G2581 Restless legs syndrome: Secondary | ICD-10-CM | POA: Diagnosis present

## 2018-10-25 DIAGNOSIS — Z833 Family history of diabetes mellitus: Secondary | ICD-10-CM | POA: Diagnosis not present

## 2018-10-25 DIAGNOSIS — K7460 Unspecified cirrhosis of liver: Secondary | ICD-10-CM | POA: Diagnosis present

## 2018-10-25 LAB — GLUCOSE, CAPILLARY
Glucose-Capillary: 152 mg/dL — ABNORMAL HIGH (ref 70–99)
Glucose-Capillary: 132 mg/dL — ABNORMAL HIGH (ref 70–99)
Glucose-Capillary: 117 mg/dL — ABNORMAL HIGH (ref 70–99)
Glucose-Capillary: 181 mg/dL — ABNORMAL HIGH (ref 70–99)

## 2018-10-25 LAB — BASIC METABOLIC PANEL
Anion gap: 9 (ref 5–15)
BUN: 59 mg/dL — ABNORMAL HIGH (ref 6–20)
CALCIUM: 9.1 mg/dL (ref 8.9–10.3)
CO2: 19 mmol/L — ABNORMAL LOW (ref 22–32)
Chloride: 107 mmol/L (ref 98–111)
Creatinine, Ser: 2.15 mg/dL — ABNORMAL HIGH (ref 0.44–1.00)
GFR calc Af Amer: 29 mL/min — ABNORMAL LOW (ref >60)
GFR calc non Af Amer: 25 mL/min — ABNORMAL LOW (ref >60)
Glucose, Bld: 131 mg/dL — ABNORMAL HIGH (ref 70–99)
Potassium: 4.5 mmol/L (ref 3.5–5.1)
Sodium: 135 mmol/L (ref 135–145)

## 2018-10-25 LAB — TSH: TSH: 1.651 u[IU]/mL (ref 0.350–4.500)

## 2018-10-25 MED ORDER — ONDANSETRON HCL 4 MG PO TABS: 4.0000 mg | ORAL_TABLET | Freq: Four times a day (QID) | ORAL | Status: DC | PRN

## 2018-10-25 MED ORDER — ACETAMINOPHEN 650 MG RE SUPP: 650.0000 mg | Freq: Four times a day (QID) | RECTAL | Status: DC | PRN

## 2018-10-25 MED ORDER — ONDANSETRON HCL 4 MG/2ML IJ SOLN: 4.0000 mg | Freq: Four times a day (QID) | INTRAMUSCULAR | Status: DC | PRN

## 2018-10-25 MED ORDER — ACETAMINOPHEN 325 MG PO TABS
650.0000 mg | ORAL_TABLET | Freq: Four times a day (QID) | ORAL | Status: DC | PRN
  Administered 2018-10-25 (×2): 650 mg via ORAL
  Filled 2018-10-25 (×2): qty 2

## 2018-10-25 MED ORDER — LAMOTRIGINE 25 MG PO TABS
25.0000 mg | ORAL_TABLET | Freq: Two times a day (BID) | ORAL | Status: DC
  Administered 2018-10-25 (×2): 25 mg via ORAL
  Filled 2018-10-25 (×2): qty 1

## 2018-10-25 MED ORDER — CARVEDILOL 12.5 MG PO TABS
12.5000 mg | ORAL_TABLET | Freq: Two times a day (BID) | ORAL | Status: DC
  Administered 2018-10-25: 12.5 mg via ORAL
  Filled 2018-10-25: qty 1

## 2018-10-25 MED ORDER — PANTOPRAZOLE SODIUM 40 MG PO TBEC
40.0000 mg | DELAYED_RELEASE_TABLET | Freq: Every day | ORAL | Status: DC
  Administered 2018-10-25: 40 mg via ORAL
  Filled 2018-10-25: qty 1

## 2018-10-25 MED ORDER — INSULIN GLARGINE 100 UNIT/ML ~~LOC~~ SOLN
12.0000 [IU] | Freq: Every day | SUBCUTANEOUS | Status: DC
  Administered 2018-10-25: 12 [IU] via SUBCUTANEOUS
  Filled 2018-10-25 (×2): qty 0.12

## 2018-10-25 MED ORDER — OXYCODONE-ACETAMINOPHEN 5-325 MG PO TABS
1.0000 | ORAL_TABLET | ORAL | Status: DC | PRN
  Administered 2018-10-25: 1 via ORAL
  Filled 2018-10-25: qty 1

## 2018-10-25 MED ORDER — INSULIN ASPART 100 UNIT/ML ~~LOC~~ SOLN: 0.0000 [IU] | Freq: Three times a day (TID) | SUBCUTANEOUS | Status: DC

## 2018-10-25 MED ORDER — FAMOTIDINE 20 MG PO TABS: 20.0000 mg | ORAL_TABLET | Freq: Every evening | ORAL | Status: DC

## 2018-10-25 MED ORDER — HEPARIN SODIUM (PORCINE) 5000 UNIT/ML IJ SOLN
5000.0000 [IU] | Freq: Three times a day (TID) | INTRAMUSCULAR | Status: DC
  Administered 2018-10-25: 5000 [IU] via SUBCUTANEOUS
  Filled 2018-10-25: qty 1

## 2018-10-25 MED ORDER — NITROGLYCERIN 0.4 MG SL SUBL: 0.4000 mg | SUBLINGUAL_TABLET | SUBLINGUAL | Status: DC | PRN

## 2018-10-25 MED ORDER — GABAPENTIN 100 MG PO CAPS: 100.0000 mg | ORAL_CAPSULE | Freq: Two times a day (BID) | ORAL | Status: DC

## 2018-10-25 MED ORDER — DOCUSATE SODIUM 100 MG PO CAPS
100.0000 mg | ORAL_CAPSULE | Freq: Two times a day (BID) | ORAL | Status: DC
  Administered 2018-10-25 (×2): 100 mg via ORAL
  Filled 2018-10-25 (×2): qty 1

## 2018-10-25 NOTE — Progress Notes (Signed)
Pt complaining of headache and generalized pain; Primary nurse paged and spoke to Dr. Arville Care; Orders received for Percoset 1 Tablet q 4 hr prn.

## 2018-10-25 NOTE — H&P (Signed)
Bonnie Ochoa is an 53 y.o. female.   Chief Complaint: Abnormal labs HPI: The patient with past medical history of CHF, CAD, diabetes, hypertension and hypothyroidism presents emergency department upon request of her physician due to elevated creatinine and hyperkalemia.  She reports that she feels sore all over.  EKG shows no signs of hyperkalemia nor arrhythmia.  However due to her worsening kidney function the emergency department staff called the hospitalist service for admission.  Past Medical History:  Diagnosis Date  . Abdominal pain    a. 05/2018 HIDA scan wnl.  . ADHD   . Anemia   . Arthritis   . Asthma   . Bipolar 1 disorder (HCC)   . Chest pain    a. Hx of cath in [place] - reportedly nl; b. 04/2018 MV: EF 22%, fixed dist ant septal, apical, and inferoapical defects - ? scar vs. attenuation. No ischemia.  . CHF (congestive heart failure) (HCC)   . Cirrhosis of liver (HCC)   . Coronary artery disease   . Depression   . Diabetes mellitus without complication (HCC)   . Diverticulitis   . Dysrhythmia   . Heart murmur   . HFrEF (heart failure with reduced ejection fraction) (HCC)    a. 06/2017 Echo: EF 20-25%, diff HK. Mildly dil LA; b. 05/2018 Echo: EF 25-30%, diff HK, Gr2 DD. Triv AI. Mod MR. Mildly reduced RV fxn. Mod-Sev TR. PASP 45-5mmHg.  Marland Kitchen Hypertension   . Hypothyroidism   . IBS (irritable bowel syndrome)   . Insomnia   . Migraines   . NICM (nonischemic cardiomyopathy) (HCC)    a. EF prev 25%; b. 11/2014 s/p SJM Fortify Assura, single lead AICD (ser# [id number]); c. 06/2017 Echo: EF 20-25%; d. 05/2018 Echo: EF 25-30%, diff HK, Gr2 DD; e. 05/2018 Cath: Nl cors. LVEDP , PCWP . PA 65/40 (52). CO/CI 3.04/1.56.  . OSA (obstructive sleep apnea)   . PTSD (post-traumatic stress disorder)   . Restless leg syndrome   . Sleep apnea   . Vertigo     Past Surgical History:  Procedure Laterality Date  . ABDOMINAL HYSTERECTOMY    . CARDIAC CATHETERIZATION    . CORONARY  ARTERY BYPASS GRAFT    . debribalator  2016  . INSERT / REPLACE / REMOVE PACEMAKER    . INSERTION OF ICD    . RIGHT/LEFT HEART CATH AND CORONARY ANGIOGRAPHY N/A 06/09/2018   Procedure: RIGHT/LEFT HEART CATH AND CORONARY ANGIOGRAPHY;  Surgeon: Iran Ouch, MD;  Location: [facility] INVASIVE CV LAB;  Service: Cardiovascular;  Laterality: N/A;  . TONSILLECTOMY    . TONSILLECTOMY    . TONSILLECTOMY    . TUBAL LIGATION  1980  . TUBAL LIGATION      Family History  Problem Relation Age of Onset  . Hypertension Mother   . Hyperlipidemia Mother   . Heart disease Mother   . Hypertension Sister   . Asthma Sister   . Heart disease Sister   . Diabetes Sister   . Cancer Sister   . Alzheimer's disease Maternal Grandfather   . Hyperlipidemia Brother   . Asthma Sister   . Hypertension Sister   . Diabetes Sister    Social History:  reports that she has quit smoking. Her smoking use included cigarettes. She has never used smokeless tobacco. She reports previous alcohol use. She reports previous drug use. Frequency: 1.00 time per week. Drug: Marijuana.  Allergies:  Allergies  Allergen Reactions  . Levothyroxine Rash    (Not  in a hospital admission)   Results for orders placed or performed during the hospital encounter of 10/24/18 (from the past 48 hour(s))  CBC     Status: Abnormal   Collection Time: 10/24/18  4:20 PM  Result Value Ref Range   WBC 5.6 4.0 - 10.5 K/uL   RBC 3.25 (L) 3.87 - 5.11 MIL/uL   Hemoglobin 9.6 (L) 12.0 - 15.0 g/dL   HCT 50.5 (L) 39.7 - 67.3 %   MCV 92.6 80.0 - 100.0 fL   MCH 29.5 26.0 - 34.0 pg   MCHC 31.9 30.0 - 36.0 g/dL   RDW 41.9 37.9 - 02.4 %   Platelets 202 150 - 400 K/uL   nRBC 0.0 0.0 - 0.2 %    Comment: Performed at [facility], [address] [place], [place] [address]  Basic metabolic panel     Status: Abnormal   Collection Time: 10/24/18  4:20 PM  Result Value Ref Range   Sodium 133 (L) 135 - 145 mmol/L   Potassium 5.8 (H) 3.5 -  5.1 mmol/L   Chloride 107 98 - 111 mmol/L   CO2 15 (L) 22 - 32 mmol/L   Glucose, Bld 224 (H) 70 - 99 mg/dL   BUN 70 (H) 6 - 20 mg/dL   Creatinine, Ser 3.29 (H) 0.44 - 1.00 mg/dL   Calcium 9.4 8.9 - 92.4 mg/dL   GFR calc non Af Amer 17 (L) >60 mL/min   GFR calc Af Amer 20 (L) >60 mL/min   Anion gap 11 5 - 15    Comment: Performed at [facility], [address] [place], [place] [address]  Brain natriuretic peptide     Status: None   Collection Time: 10/24/18  4:20 PM  Result Value Ref Range   B Natriuretic Peptide 49.0 0.0 - 100.0 pg/mL    Comment: Performed at [facility], [address] [place], [place] [address]  Influenza panel by PCR (type A & B)     Status: None   Collection Time: 10/24/18  9:26 PM  Result Value Ref Range   Influenza A By PCR NEGATIVE NEGATIVE   Influenza B By PCR NEGATIVE NEGATIVE    Comment: (NOTE) The Xpert Xpress Flu assay is intended as an aid in the diagnosis of  influenza and should not be used as a sole basis for treatment.  This  assay is FDA approved for nasopharyngeal swab specimens only. Nasal  washings and aspirates are unacceptable for Xpert Xpress Flu testing. Performed at [facility], [address] [place], [place] [address]    Dg Chest 2 View  Result Date: 10/24/2018 CLINICAL DATA:  Fever and chills EXAM: CHEST - 2 VIEW COMPARISON:  09/04/2018 FINDINGS: The heart size and mediastinal contours are within normal limits. AICD device projects over the left mid lung with lead in the right ventricle. Both lungs are clear. The visualized skeletal structures are unremarkable. IMPRESSION: No active cardiopulmonary disease. Electronically Signed   By: Tollie Eth M.D.   On: 10/24/2018 22:24    Review of Systems  Constitutional: Negative for chills and fever.  HENT: Negative for sore throat and tinnitus.   Eyes: Negative for blurred vision and redness.  Respiratory: Negative for cough and shortness of breath.    Cardiovascular: Negative for chest pain, palpitations, orthopnea and PND.  Gastrointestinal: Negative for abdominal pain, diarrhea, nausea and vomiting.  Genitourinary: Negative for dysuria, frequency and urgency.  Musculoskeletal: Negative for joint pain and  myalgias.  Skin: Negative for rash.       No lesions  Neurological: Negative for speech change, focal weakness and weakness.  Endo/Heme/Allergies: Does not bruise/bleed easily.       No temperature intolerance  Psychiatric/Behavioral: Negative for depression and suicidal ideas.    Blood pressure 118/76, pulse 79, temperature 98.3 F (36.8 C), temperature source Oral, resp. rate 18, height 5\' 5"  (1.651 m), weight 86.6 kg, SpO2 98 %. Physical Exam  Vitals reviewed. Constitutional: She is oriented to person, place, and time. She appears well-developed and well-nourished. No distress.  HENT:  Head: Normocephalic and atraumatic.  Mouth/Throat: Oropharynx is clear and moist.  Eyes: Pupils are equal, round, and reactive to light. Conjunctivae and EOM are normal. No scleral icterus.  Neck: Normal range of motion. Neck supple. No JVD present. No tracheal deviation present. No thyromegaly present.  Cardiovascular: Normal rate, regular rhythm and normal heart sounds. Exam reveals no gallop and no friction rub.  No murmur heard. Respiratory: Effort normal and breath sounds normal.  GI: Soft. Bowel sounds are normal. She exhibits no distension. There is no abdominal tenderness.  Genitourinary:    Genitourinary Comments: Deferred   Musculoskeletal: Normal range of motion.        General: No edema.  Lymphadenopathy:    She has no cervical adenopathy.  Neurological: She is alert and oriented to person, place, and time. No cranial nerve deficit. She exhibits normal muscle tone.  Skin: Skin is warm and dry. No rash noted. No erythema.  Psychiatric: She has a normal mood and affect. Her behavior is normal. Judgment and thought content normal.      Assessment/Plan This is a 53 year old female admitted for acute on chronic kidney disease. 1.  AKI: Superimposed upon chronic kidney disease.  Hold Lasix.  I have also held Entresto and spironolactone.  Avoid nephrotoxic agents.  Consult nephrology. 2.  Hyperkalemia: Kayexalate in the emergency department.  Continue to monitor telemetry. 3.  CHF: Chronic; systolic.  Last EF 20 to 25%.  Avoid fluid overload 4.  Diabetes mellitus type 2: Continue basal insulin therapy.  Hold oral antihyperglycemic agents.  Sliding scale insulin while hospitalized. 5.  DVT prophylaxis: Heparin 6.  GI prophylaxis: Pantoprazole per home regimen The patient is a full code.  Time spent on admission orders and patient care approximately 45 minutes  Arnaldo Natal, MD 10/25/2018, 1:37 AM

## 2018-10-25 NOTE — Progress Notes (Signed)
[facility], [place] 10/25/18  Subjective:   Patient known to our practice from outpatient follow-up.  She is followed for chronic kidney disease.Her baseline creatinine from November 2019 is 1.35, GFR 51.  She had outpatient labs which showed acute increase in creatinine and potassium. Creatinine was 24, potassium of 6.4.  She was asked to come to the emergency room for evaluation.  Outpatient she has been feeling poorly.  Reports pain in her chest, shoulder, legs and numbness in her hands.  This is chronic.  She has some upper respiratory symptoms, generalized body ache for the past 1 to 2 weeks.  No fever.  No sputum production.  Objective:  Vital signs in last 24 hours:  Temp:  [98 F (36.7 C)-98.3 F (36.8 C)] 98 F (36.7 C) (03/14 0158) Pulse Rate:  [77-83] 77 (03/14 0158) Resp:  [18] 18 (03/14 0158) BP: (84-118)/(53-76) 106/70 (03/14 0158) SpO2:  [97 %-100 %] 100 % (03/14 0158) Weight:  [86.6 kg] 86.6 kg (03/13 1618)  Weight change:  Filed Weights   10/24/18 1618  Weight: 86.6 kg    Intake/Output:   No intake or output data in the 24 hours ending 10/25/18 0824   Physical Exam: General:  Laying in the bed, no acute distress  HEENT  anicteric, moist oral mucous membranes  Neck  supple, no masses  Pulm/lungs  normal breathing effort, clear to auscultation  CVS/Heart  regular rhythm, no rub  Abdomen:   Soft, mild discomfort  Extremities:  No edema  Neurologic:  Alert, oriented  Skin:  No acute rashes    Basic Metabolic Panel:  Recent Labs  Lab 10/24/18 1620  NA 133*  K 5.8*  CL 107  CO2 15*  GLUCOSE 224*  BUN 70*  CREATININE 2.98*  CALCIUM 9.4     CBC: Recent Labs  Lab 10/24/18 1620  WBC 5.6  HGB 9.6*  HCT 30.1*  MCV 92.6  PLT 202      Lab Results  Component Value Date   HEPBSAG Negative 05/30/2018   HEPBIGM Negative 05/30/2018      Microbiology:  No results found for this or any previous visit (from the  past 240 hour(s)).  Coagulation Studies: No results for input(s): LABPROT, INR in the last 72 hours.  Urinalysis: No results for input(s): COLORURINE, LABSPEC, PHURINE, GLUCOSEU, HGBUR, BILIRUBINUR, KETONESUR, PROTEINUR, UROBILINOGEN, NITRITE, LEUKOCYTESUR in the last 72 hours.  Invalid input(s): APPERANCEUR    Imaging: Dg Chest 2 View  Result Date: 10/24/2018 CLINICAL DATA:  Fever and chills EXAM: CHEST - 2 VIEW COMPARISON:  09/04/2018 FINDINGS: The heart size and mediastinal contours are within normal limits. AICD device projects over the left mid lung with lead in the right ventricle. Both lungs are clear. The visualized skeletal structures are unremarkable. IMPRESSION: No active cardiopulmonary disease. Electronically Signed   By: Tollie Eth M.D.   On: 10/24/2018 22:24     Medications:   . sodium chloride Stopped (10/24/18 2119)   . carvedilol  12.5 mg Oral BID  . docusate sodium  100 mg Oral BID  . famotidine  20 mg Oral QPM  . heparin  5,000 Units Subcutaneous Q8H  . insulin aspart  0-9 Units Subcutaneous TID WC  . insulin glargine  12 Units Subcutaneous QHS  . lamoTRIgine  25 mg Oral BID  . pantoprazole  40 mg Oral Daily   acetaminophen **OR** acetaminophen, nitroGLYCERIN, ondansetron **OR** ondansetron (ZOFRAN) IV, oxyCODONE-acetaminophen  Assessment/ Plan:  53 y.o. female  osteoarthritis, bipolar disorder, congestive heart failure, cirrhosis of the liver, depression, diabetes mellitus type 2, hypertension, irritable bowel syndrome, PTSD, restless leg syndrome, obstructive sleep apnea  1.  Chronic kidney disease stage III, acute renal failure. baseline creatinine from November 2019 is 1.35, GFR 51 2.  Hyperkalemia  Acute kidney injury likely secondary to poor oral intake for the past few days and continuation of diuretics.  Patient is on potassium supplementation and spironolactone which may have contributed to hyperkalemia.  We also reviewed her diet.  Patient drinks  orange juice at home.  Asked her to discontinue that.  We also reviewed other sources of potassium including potatoes, tomatoes, juices, salt substitutes etc.  We will get a dietitian consult for detailed evaluation Potassium level has improved this morning Volume status is acceptable   LOS: 0 Brysten Reister 3/14/20208:24 AM  [facility] [place], [place] [phone]  Note: This note was prepared with Dragon dictation. Any transcription errors are unintentional

## 2018-10-25 NOTE — ED Provider Notes (Signed)
[facility] Emergency Department Provider Note   ____________________________________________   First MD Initiated Contact with Patient 10/24/18 2104     (approximate)  I have reviewed the triage vital signs and the nursing notes.   HISTORY  Chief Complaint Abnormal Lab    HPI Bonnie Ochoa is a 53 y.o. female reports she is here because her doctor called her with some concerning lab work  Patient here for evaluation, states for the last 2 weeks she has had some slight body aches, some chills and her diet has been decreased.  She continues to take her fluid pills for congestive heart failure.  She had lab work done and was told she needed to come in because her kidneys were weak and her potassium was high  Denies nausea or vomiting at this time.  Reports just a little bit decreased appetite.  Feels a little bit dry or dehydrated and thirsty.  She follows with Dr. Ronna Polio of cardiology.  She has a history of congestive heart failure but denies weight gain, reports she is actually lost weight and is not having any swelling in her legs.  No shortness of breath or cough.  The muscle aches and discomfort she had 2 weeks ago seem to be getting better   Past Medical History:  Diagnosis Date  . Abdominal pain    a. 05/2018 HIDA scan wnl.  . ADHD   . Anemia   . Arthritis   . Asthma   . Bipolar 1 disorder ([place])   . Chest pain    a. Hx of cath in [place] - reportedly nl; b. 04/2018 MV: EF 22%, fixed dist ant septal, apical, and inferoapical defects - ? scar vs. attenuation. No ischemia.  . CHF (congestive heart failure) ([place])   . Cirrhosis of liver ([place])   . Coronary artery disease   . Depression   . Diabetes mellitus without complication ([place])   . Diverticulitis   . Dysrhythmia   . Heart murmur   . HFrEF (heart failure with reduced ejection fraction) ([place])    a. 06/2017 Echo: EF 20-25%, diff HK. Mildly dil LA; b. 05/2018 Echo: EF 25-30%, diff HK, Gr2 DD.  Triv AI. Mod MR. Mildly reduced RV fxn. Mod-Sev TR. PASP 45-21mHg.  .Marland KitchenHypertension   . Hypothyroidism   . IBS (irritable bowel syndrome)   . Insomnia   . Migraines   . NICM (nonischemic cardiomyopathy) ([place]    a. EF prev 25%; b. 11/2014 s/p SJM Fortify Assura, single lead AICD (ser# [id number]; c. 06/2017 Echo: EF 20-25%; d. 05/2018 Echo: EF 25-30%, diff HK, Gr2 DD; e. 05/2018 Cath: Nl cors. LVEDP 220mg, PCWP 3221m. PA 65/40 (52). CO/CI 3.04/1.56.  . OSA (obstructive sleep apnea)   . PTSD (post-traumatic stress disorder)   . Restless leg syndrome   . Sleep apnea   . Vertigo     Patient Active Problem List   Diagnosis Date Noted  . NSTEMI (non-ST elevated myocardial infarction) (HCC[place] . Unstable angina (HCCTatums0/26/2019  . Abnormal liver enzymes   . Acute cholecystitis 05/26/2018  . Abdominal pain, right upper quadrant   . Hyperbilirubinemia   . Chest pain 05/08/2018  . Acute renal failure (ARF) (HCC[place]ris[place]9/13/2019  . Hypothyroid 04/30/2017  . HTN (hypertension) 03/11/2017  . Multiple thyroid nodules 01/29/2017  . Chronic systolic heart failure (HCCWaldorf2/26/2018  . Obstructive sleep apnea 10/08/2016  . Noncompliance 09/18/2016  . Cirrhosis (HCCNey2/01/2017  . Pulmonary hypertension ([place]CC[place]  05/30/2016  . Hypokalemia 05/30/2016  . NICM (nonischemic cardiomyopathy) ([place]) 05/30/2016  . Other ascites   . Benign hypertensive renal disease   . DM (diabetes mellitus), type 2 with renal complications ([place])   . Bipolar 1 disorder ([place])   . Asthma   . Generalized abdominal pain 05/07/2016    Past Surgical History:  Procedure Laterality Date  . ABDOMINAL HYSTERECTOMY    . CARDIAC CATHETERIZATION    . CORONARY ARTERY BYPASS GRAFT    . debribalator  2016  . INSERT / REPLACE / REMOVE PACEMAKER    . INSERTION OF ICD    . RIGHT/LEFT HEART CATH AND CORONARY ANGIOGRAPHY N/A 06/09/2018   Procedure: RIGHT/LEFT HEART CATH AND CORONARY ANGIOGRAPHY;  Surgeon: Wellington Hampshire, MD;  Location:  [place] [facility];  Service: Cardiovascular;  Laterality: N/A;  . TONSILLECTOMY    . TONSILLECTOMY    . TONSILLECTOMY    . TUBAL LIGATION  1980  . TUBAL LIGATION      Prior to Admission medications   Medication Sig Start Date End Date Taking? Authorizing Provider  blood glucose meter kit and supplies KIT Dispense based on patient and insurance preference. Use up to four times daily as directed. (FOR ICD-9 250.00, 250.01). 07/24/17  Yes Johnson, Megan P, DO  carvedilol (COREG) 12.5 MG tablet Take 1 tablet (12.5 mg total) by mouth 2 (two) times daily. 06/03/18 09/19/19  Theora Gianotti, NP  cyclobenzaprine (FLEXERIL) 5 MG tablet Take 5 mg by mouth 3 (three) times daily as needed for muscle spasms.    [provider]  dicyclomine (BENTYL) 20 MG tablet TAKE 1 TABLET (20 MG TOTAL) BY MOUTH 3 (THREE) TIMES DAILY AS NEEDED FOR SPASMS. 10/24/18   Johnson, Megan P, DO  digoxin (LANOXIN) 0.125 MG tablet Take 1 tablet (0.125 mg total) by mouth daily. 07/14/18   Clegg, Amy D, NP  empagliflozin (JARDIANCE) 25 MG TABS tablet Take 25 mg by mouth daily. 07/30/18   Johnson, Megan P, DO  famotidine (PEPCID) 40 MG tablet TAKE 1 TABLET BY MOUTH EVERY DAY IN THE EVENING 09/09/18   Johnson, Megan P, DO  furosemide (LASIX) 40 MG tablet Take 1 tablet (40 mg total) by mouth daily. 06/24/18   Theora Gianotti, NP  Insulin Glargine (LANTUS SOLOSTAR) 100 UNIT/ML Solostar Pen Inject 20 Units into the skin daily at 10 pm. 04/27/18   Bettey Costa, MD  Insulin Pen Needle 32G X 6 MM MISC 1 each by Does not apply route daily. 04/07/18   Johnson, Megan P, DO  lamoTRIgine (LAMICTAL) 25 MG tablet Take 1 tablet (25 mg total) by mouth 2 (two) times daily. 07/30/18   [name], MD  metoCLOPramide (REGLAN) 10 MG tablet Take 1 tablet (10 mg total) by mouth 3 (three) times daily with meals. 07/30/18 07/30/19  Park Liter P, DO  nitroGLYCERIN (NITROSTAT) 0.4 MG SL tablet Place 1 tablet (0.4 mg total) under  the tongue every 5 (five) minutes as needed for chest pain. Patient taking differently: Place 0.4 mg under the tongue every 5 (five) minutes as needed for chest pain. Chest pain. 05/19/18 05/19/19  [name], MD  pantoprazole (PROTONIX) 40 MG tablet TAKE 1 TABLET BY MOUTH EVERY DAY 09/22/18   [name], MD  PARoxetine (PAXIL) 30 MG tablet Take 1 tablet (30 mg total) by mouth daily. 07/30/18   [name], MD  potassium chloride SA (K-DUR,KLOR-CON) 20 MEQ tablet Take 2 tablets (40 mEq total) by mouth 2 (two) times  daily. 09/12/18   Clegg, Amy D, NP  QUEtiapine (SEROQUEL) 300 MG tablet Take 1 tablet (300 mg total) by mouth at bedtime. 07/30/18   [name], MD  sacubitril-valsartan (ENTRESTO) 49-51 MG Take 1 tablet by mouth 2 (two) times daily. 08/25/18   Clegg, Amy D, NP  spironolactone (ALDACTONE) 25 MG tablet Take 1 tablet (25 mg total) by mouth daily. 07/14/18 10/21/18  Clegg, Amy D, NP  sucralfate (CARAFATE) 1 g tablet TAKE 1 TABLET (1 G TOTAL) BY MOUTH 4 (FOUR) TIMES DAILY. 07/07/18   Park Liter P, DO    Allergies Levothyroxine  Family History  Problem Relation Age of Onset  . Hypertension Mother   . Hyperlipidemia Mother   . Heart disease Mother   . Hypertension Sister   . Asthma Sister   . Heart disease Sister   . Diabetes Sister   . Cancer Sister   . Alzheimer's disease Maternal Grandfather   . Hyperlipidemia Brother   . Asthma Sister   . Hypertension Sister   . Diabetes Sister     Social History Social History   Tobacco Use  . Smoking status: Former Smoker    Types: Cigarettes  . Smokeless tobacco: Never Used  . Tobacco comment: quit about 20 years ago   Substance Use Topics  . Alcohol use: Not Currently  . Drug use: Not Currently    Frequency: 1.0 times per week    Types: Marijuana    Review of Systems Constitutional: See HPI eyes: No visual changes. ENT: No sore throat. Cardiovascular: Denies chest pain. Respiratory: Denies shortness of  breath. Gastrointestinal: No abdominal pain.   Genitourinary: Negative for dysuria. Musculoskeletal: Negative for back pain. Skin: Negative for rash. Neurological: Negative for headaches, areas of focal weakness or numbness.    ____________________________________________   PHYSICAL EXAM:  VITAL SIGNS: ED Triage Vitals [10/24/18 1618]  Enc Vitals Group     BP (!) 84/53     Pulse Rate 83     Resp 18     Temp 98.3 F (36.8 C)     Temp Source Oral     SpO2 97 %     Weight 191 lb (86.6 kg)     Height 5' 5"  (1.651 m)     Head Circumference      Peak Flow      Pain Score 0     Pain Loc      Pain Edu?      Excl. in [place]?   Blood pressure 111/73 at midnight after fluids  Constitutional: Alert and oriented. Well appearing and in no acute distress. Eyes: Conjunctivae are normal. Head: Atraumatic. Nose: No congestion/rhinnorhea. Mouth/Throat: Mucous membranes are slightly dry. Neck: No stridor.  Cardiovascular: Normal rate, regular rhythm. Grossly normal heart sounds.  Good peripheral circulation. Respiratory: Normal respiratory effort.  No retractions. Lungs CTAB. Gastrointestinal: Soft and nontender. No distention. Musculoskeletal: No lower extremity tenderness nor edema. Neurologic:  Normal speech and language. No gross focal neurologic deficits are appreciated.  Skin:  Skin is warm, dry and intact. No rash noted. Psychiatric: Mood and affect are normal. Speech and behavior are normal.  ____________________________________________   LABS (all labs ordered are listed, but only abnormal results are displayed)  Labs Reviewed  CBC - Abnormal; Notable for the following components:      Result Value   RBC 3.25 (*)    Hemoglobin 9.6 (*)    HCT 30.1 (*)    All other components within normal limits  BASIC METABOLIC PANEL - Abnormal; Notable for the following components:   Sodium 133 (*)    Potassium 5.8 (*)    CO2 15 (*)    Glucose, Bld 224 (*)    BUN 70 (*)     Creatinine, Ser 2.98 (*)    GFR calc non Af Amer 17 (*)    GFR calc Af Amer 20 (*)    All other components within normal limits  BRAIN NATRIURETIC PEPTIDE  INFLUENZA PANEL BY PCR (TYPE A & B)   ____________________________________________  EKG  Reviewed at 1620 Heart rate 90 QRS 100 QTc 400 Normal sinus rhythm, T wave inversions in a inferior lateral distribution.  No ST elevation.  Similar to September 04, 2018 compared ____________________________________________  RADIOLOGY  Dg Chest 2 View  Result Date: 10/24/2018 CLINICAL DATA:  Fever and chills EXAM: CHEST - 2 VIEW COMPARISON:  09/04/2018 FINDINGS: The heart size and mediastinal contours are within normal limits. AICD device projects over the left mid lung with lead in the right ventricle. Both lungs are clear. The visualized skeletal structures are unremarkable. IMPRESSION: No active cardiopulmonary disease. Electronically Signed   By: Ashley Royalty M.D.   On: 10/24/2018 22:24    Chest x-ray reviewed negative for acute ____________________________________________   PROCEDURES  Procedure(s) performed: None  Procedures  Critical Care performed: No  ____________________________________________   INITIAL IMPRESSION / ASSESSMENT AND PLAN / ED COURSE  Pertinent labs & imaging results that were available during my care of the patient were reviewed by me and considered in my medical decision making (see chart for details).   Patient presents for abnormal labs.  Suspect likely element of dehydration likely between her Lasix and illness which sounds like it was likely a viral illness 2 weeks ago is now improving.  She was initially just slightly hypotensive but after fluid bolus normotensive fully awake and alert no distress.  Lab work reassuring except for mild hyperkalemia without EKG changes and obvious acute kidney injury.  Initiate hydration here, Kayexalate.  Patient agreeable with plan for admission.  No acute cardiac  pulmonary infectious symptoms noted at this time  Case and care discussed with Dr. Marcille Blanco who is going to admit the patient      ____________________________________________   FINAL CLINICAL IMPRESSION(S) / ED DIAGNOSES  Final diagnoses:  AKI (acute kidney injury) ([place])  Dehydration  Hyperkalemia        Note:  This document was prepared using Dragon voice recognition software and may include unintentional dictation errors       Delman Kitten, MD 10/25/18 0012

## 2018-10-25 NOTE — ED Notes (Signed)
ED TO INPATIENT HANDOFF REPORT  ED Nurse Name and Phone #: [phone] [phone]  S Name/Age/Gender Bonnie Ochoa 53 y.o. female Room/Bed: ED08A/ED08A  Code Status   Code Status: Prior  Home/SNF/Other Home Patient oriented to: self, place, time and situation Is this baseline? Yes   Triage Complete: Triage complete  Chief Complaint High K  Triage Note Pt states her kidney MD told her to come to ED, states "my kidneys are low and my potassium is too high." A&O, in wheelchair. Is not a dialysis pt.    Allergies Allergies  Allergen Reactions  . Levothyroxine Rash    Level of Care/Admitting Diagnosis ED Disposition    ED Disposition Condition Comment   Admit  Hospital Area[facility] [[id number]]  Level of Care: Med-Surg [16]  Diagnosis: Acute on chronic kidney failure [facility]) [[id number]]  Admitting Physician: Arnaldo Natal [[id number]]  Attending Physician: Arnaldo Natal [[id number]]  Estimated length of stay: past midnight tomorrow  Certification:: I certify this patient will need inpatient services for at least 2 midnights  PT Class (Do Not Modify): Inpatient [101]  PT Acc Code (Do Not Modify): Private [1]       B Medical/Surgery History Past Medical History:  Diagnosis Date  . Abdominal pain    a. 05/2018 HIDA scan wnl.  . ADHD   . Anemia   . Arthritis   . Asthma   . Bipolar 1 disorder (HCC)   . Chest pain    a. Hx of cath in [place] - reportedly nl; b. 04/2018 MV: EF 22%, fixed dist ant septal, apical, and inferoapical defects - ? scar vs. attenuation. No ischemia.  . CHF (congestive heart failure) (HCC)   . Cirrhosis of liver (HCC)   . Coronary artery disease   . Depression   . Diabetes mellitus without complication (HCC)   . Diverticulitis   . Dysrhythmia   . Heart murmur   . HFrEF (heart failure with reduced ejection fraction) (HCC)    a. 06/2017 Echo: EF 20-25%, diff HK. Mildly dil LA; b. 05/2018 Echo: EF 25-30%, diff HK, Gr2 DD. Triv AI.  Mod MR. Mildly reduced RV fxn. Mod-Sev TR. PASP 45-10mmHg.  Marland Kitchen Hypertension   . Hypothyroidism   . IBS (irritable bowel syndrome)   . Insomnia   . Migraines   . NICM (nonischemic cardiomyopathy) (HCC)    a. EF prev 25%; b. 11/2014 s/p SJM Fortify Assura, single lead AICD (ser# [id number]); c. 06/2017 Echo: EF 20-25%; d. 05/2018 Echo: EF 25-30%, diff HK, Gr2 DD; e. 05/2018 Cath: Nl cors. LVEDP , PCWP . PA 65/40 (52). CO/CI 3.04/1.56.  . OSA (obstructive sleep apnea)   . PTSD (post-traumatic stress disorder)   . Restless leg syndrome   . Sleep apnea   . Vertigo    Past Surgical History:  Procedure Laterality Date  . ABDOMINAL HYSTERECTOMY    . CARDIAC CATHETERIZATION    . CORONARY ARTERY BYPASS GRAFT    . debribalator  2016  . INSERT / REPLACE / REMOVE PACEMAKER    . INSERTION OF ICD    . RIGHT/LEFT HEART CATH AND CORONARY ANGIOGRAPHY N/A 06/09/2018   Procedure: RIGHT/LEFT HEART CATH AND CORONARY ANGIOGRAPHY;  Surgeon: Iran Ouch, MD;  Location: [facility] INVASIVE CV LAB;  Service: Cardiovascular;  Laterality: N/A;  . TONSILLECTOMY    . TONSILLECTOMY    . TONSILLECTOMY    . TUBAL LIGATION  1980  . TUBAL LIGATION       A  IV Location/Drains/Wounds Patient Lines/Drains/Airways Status   Active Line/Drains/Airways    Name:   Placement date:   Placement time:   Site:   Days:   Peripheral IV 06/19/18 Right Antecubital   06/19/18    1006    Antecubital   128          Intake/Output Last 24 hours No intake or output data in the 24 hours ending 10/25/18 0113  Labs/Imaging Results for orders placed or performed during the hospital encounter of 10/24/18 (from the past 48 hour(s))  CBC     Status: Abnormal   Collection Time: 10/24/18  4:20 PM  Result Value Ref Range   WBC 5.6 4.0 - 10.5 K/uL   RBC 3.25 (L) 3.87 - 5.11 MIL/uL   Hemoglobin 9.6 (L) 12.0 - 15.0 g/dL   HCT 31.4 (L) 27.6 - 70.1 %   MCV 92.6 80.0 - 100.0 fL   MCH 29.5 26.0 - 34.0 pg   MCHC 31.9 30.0 - 36.0  g/dL   RDW 10.0 34.9 - 61.1 %   Platelets 202 150 - 400 K/uL   nRBC 0.0 0.0 - 0.2 %    Comment: Performed at [facility], [address] [place], [place] [address]  Basic metabolic panel     Status: Abnormal   Collection Time: 10/24/18  4:20 PM  Result Value Ref Range   Sodium 133 (L) 135 - 145 mmol/L   Potassium 5.8 (H) 3.5 - 5.1 mmol/L   Chloride 107 98 - 111 mmol/L   CO2 15 (L) 22 - 32 mmol/L   Glucose, Bld 224 (H) 70 - 99 mg/dL   BUN 70 (H) 6 - 20 mg/dL   Creatinine, Ser 9.12 (H) 0.44 - 1.00 mg/dL   Calcium 9.4 8.9 - 25.8 mg/dL   GFR calc non Af Amer 17 (L) >60 mL/min   GFR calc Af Amer 20 (L) >60 mL/min   Anion gap 11 5 - 15    Comment: Performed at [facility], [address] [place], [place] [address]  Brain natriuretic peptide     Status: None   Collection Time: 10/24/18  4:20 PM  Result Value Ref Range   B Natriuretic Peptide 49.0 0.0 - 100.0 pg/mL    Comment: Performed at [facility], [address] [place], [place] [address]  Influenza panel by PCR (type A & B)     Status: None   Collection Time: 10/24/18  9:26 PM  Result Value Ref Range   Influenza A By PCR NEGATIVE NEGATIVE   Influenza B By PCR NEGATIVE NEGATIVE    Comment: (NOTE) The Xpert Xpress Flu assay is intended as an aid in the diagnosis of  influenza and should not be used as a sole basis for treatment.  This  assay is FDA approved for nasopharyngeal swab specimens only. Nasal  washings and aspirates are unacceptable for Xpert Xpress Flu testing. Performed at [facility], [address] [place], [place] [address]    Dg Chest 2 View  Result Date: 10/24/2018 CLINICAL DATA:  Fever and chills EXAM: CHEST - 2 VIEW COMPARISON:  09/04/2018 FINDINGS: The heart size and mediastinal contours are within normal limits. AICD device projects over the left mid lung with lead in the right ventricle. Both lungs are clear. The visualized skeletal structures are unremarkable.  IMPRESSION: No active cardiopulmonary disease. Electronically Signed   By: Tollie Eth M.D.   On: 10/24/2018 22:24    Pending Labs Unresulted  Labs (From admission, onward)    Start     Ordered   Signed and Held  TSH  Add-on,   R     Signed and Held          Vitals/Pain Today's Vitals   10/24/18 1618 10/24/18 1619 10/24/18 2059  BP: (!) 84/53 (!) 88/53   Pulse: 83    Resp: 18    Temp: 98.3 F (36.8 C)    TempSrc: Oral    SpO2: 97%    Weight: 86.6 kg    Height: 5\' 5"  (1.651 m)    PainSc: 0-No pain  10-Worst pain ever    Isolation Precautions No active isolations  Medications Medications  sodium chloride 0.9 % bolus 500 mL (500 mLs Intravenous Bolus 10/24/18 2119)  sodium polystyrene (KAYEXALATE) 15 GM/60ML suspension 30 g (30 g Oral Given 10/24/18 2156)    Mobility walks Low fall risk   Focused Assessments Renal Assessment Handoff:  Hemodialysis Schedule: Hemodialysis Schedule: Monday/Wednesday/Friday Last Hemodialysis date and time: n/a   Restricted appendage: right arm     R Recommendations: See Admitting Provider Note  Report given to:   Additional Notes: not dialysis pt.  '

## 2018-10-25 NOTE — Discharge Summary (Signed)
[facility] NAME: Bonnie Ochoa    MR#:  [id number]  DATE OF BIRTH:  1965-11-12  DATE OF ADMISSION:  10/24/2018 ADMITTING PHYSICIAN: Harrie Foreman, MD  DATE OF DISCHARGE: 10/25/2018  PRIMARY CARE PHYSICIAN: Valerie Roys, DO    ADMISSION DIAGNOSIS:  Dehydration [E86.0] Hyperkalemia [E87.5] AKI (acute kidney injury) ([place]) [N17.9]  DISCHARGE DIAGNOSIS:  Active Problems:   Acute on chronic kidney failure ([place])   SECONDARY DIAGNOSIS:   Past Medical History:  Diagnosis Date  . Abdominal pain    a. 05/2018 HIDA scan wnl.  . ADHD   . Anemia   . Arthritis   . Asthma   . Bipolar 1 disorder ([place])   . Chest pain    a. Hx of cath in [place] - reportedly nl; b. 04/2018 MV: EF 22%, fixed dist ant septal, apical, and inferoapical defects - ? scar vs. attenuation. No ischemia.  . CHF (congestive heart failure) ([place])   . Cirrhosis of liver ([place])   . Coronary artery disease   . Depression   . Diabetes mellitus without complication ([place])   . Diverticulitis   . Dysrhythmia   . Heart murmur   . HFrEF (heart failure with reduced ejection fraction) ([place])    a. 06/2017 Echo: EF 20-25%, diff HK. Mildly dil LA; b. 05/2018 Echo: EF 25-30%, diff HK, Gr2 DD. Triv AI. Mod MR. Mildly reduced RV fxn. Mod-Sev TR. PASP 45-47mHg.  .Marland KitchenHypertension   . Hypothyroidism   . IBS (irritable bowel syndrome)   . Insomnia   . Migraines   . NICM (nonischemic cardiomyopathy) ([place]    a. EF prev 25%; b. 11/2014 s/p SJM Fortify [place]ura, single lead AICD (ser# [id number]; c. 06/2017 Echo: EF 20-25%; d. 05/2018 Echo: EF 25-30%, diff HK, Gr2 DD; e. 05/2018 Cath: Nl cors. LVEDP 280mg, PCWP 3225m. PA 65/40 (52). CO/CI 3.04/1.56.  . OSA (obstructive sleep apnea)   . PTSD (post-traumatic stress disorder)   . Restless leg syndrome   . Sleep apnea   . Vertigo     HOSPITAL COURSE:   [age]ar old female with a history of chronic systolic and diastolic heart failure,  hypertension and hypothyroidism who presented to the ER due to request of her physician for elevated creatinine and hyperkalemia.  1.  Acute on chronic kidney disease stage III: Creatinine is elevated due to dehydration as well as medications.  Creatinine has improved with IV fluids.  She will discontinue Aldactone for now.  As per nephrology continue Lasix.  She will follow-up with nephrology in 3 days for repeat creatinine.  She is asked to avoid nephrotoxic medications.   2.  Hyperkalemia: This is treated.  3.  Chronic combined systolic and diastolic heart failure without signs of exacerbation: She will continue on Lasix.  She will need to follow-up with her cardiologist as an outpatient to restart Aldactone if indicated. Continue Coreg 4.  Diabetes: Continue outpatient regimen with ADA diet   DISCHARGE CONDITIONS AND DIET:   Stable for discharge heart healthy diabetic diet  CONSULTS OBTAINED:  Treatment Team:  SinMurlean IbaD  DRUG ALLERGIES:   Allergies  Allergen Reactions  . Levothyroxine Rash    DISCHARGE MEDICATIONS:   Allergies as of 10/25/2018      Reactions   Levothyroxine Rash      Medication List    STOP taking these medications   spironolactone 25 MG tablet Commonly known as:  ALDACTONE  TAKE these medications   blood glucose meter kit and supplies Kit Dispense based on patient and insurance preference. Use up to four times daily as directed. (FOR ICD-9 250.00, 250.01).   carvedilol 12.5 MG tablet Commonly known as:  COREG Take 1 tablet (12.5 mg total) by mouth 2 (two) times daily.   cyclobenzaprine 5 MG tablet Commonly known as:  FLEXERIL Take 5 mg by mouth 3 (three) times daily as needed for muscle spasms.   dicyclomine 20 MG tablet Commonly known as:  BENTYL TAKE 1 TABLET (20 MG TOTAL) BY MOUTH 3 (THREE) TIMES DAILY AS NEEDED FOR SPASMS.   digoxin 0.125 MG tablet Commonly known as:  LANOXIN Take 1 tablet (0.125 mg total) by mouth  daily.   empagliflozin 25 MG Tabs tablet Commonly known as:  JARDIANCE Take 25 mg by mouth daily.   famotidine 40 MG tablet Commonly known as:  PEPCID TAKE 1 TABLET BY MOUTH EVERY DAY IN THE EVENING   furosemide 40 MG tablet Commonly known as:  LASIX Take 1 tablet (40 mg total) by mouth daily.   Insulin Glargine 100 UNIT/ML Solostar Pen Commonly known as:  Lantus SoloStar Inject 20 Units into the skin daily at 10 pm.   Insulin Pen Needle 32G X 6 MM Misc 1 each by Does not apply route daily.   lamoTRIgine 25 MG tablet Commonly known as:  LaMICtal Take 1 tablet (25 mg total) by mouth 2 (two) times daily.   metoCLOPramide 10 MG tablet Commonly known as:  REGLAN Take 1 tablet (10 mg total) by mouth 3 (three) times daily with meals.   nitroGLYCERIN 0.4 MG SL tablet Commonly known as:  Nitrostat Place 1 tablet (0.4 mg total) under the tongue every 5 (five) minutes as needed for chest pain. What changed:  additional instructions   pantoprazole 40 MG tablet Commonly known as:  PROTONIX TAKE 1 TABLET BY MOUTH EVERY DAY   PARoxetine 30 MG tablet Commonly known as:  PAXIL Take 1 tablet (30 mg total) by mouth daily.   potassium chloride SA 20 MEQ tablet Commonly known as:  K-DUR,KLOR-CON Take 2 tablets (40 mEq total) by mouth 2 (two) times daily.   QUEtiapine 300 MG tablet Commonly known as:  SEROQUEL Take 1 tablet (300 mg total) by mouth at bedtime.   sacubitril-valsartan 49-51 MG Commonly known as:  ENTRESTO Take 1 tablet by mouth 2 (two) times daily.   sucralfate 1 g tablet Commonly known as:  CARAFATE TAKE 1 TABLET (1 G TOTAL) BY MOUTH 4 (FOUR) TIMES DAILY.         Today   CHIEF COMPLAINT:  Patient is doing well without issues this morning   VITAL SIGNS:  Blood pressure 106/70, pulse 77, temperature 98 F (36.7 C), temperature source Oral, resp. rate 18, height 5' 5"  (1.651 m), weight 86.6 kg, SpO2 100 %.   REVIEW OF SYSTEMS:  Review of Systems   Constitutional: Negative.  Negative for chills, fever and malaise/fatigue.  HENT: Negative.  Negative for ear discharge, ear pain, hearing loss, nosebleeds and sore throat.   Eyes: Negative.  Negative for blurred vision and pain.  Respiratory: Negative.  Negative for cough, hemoptysis, shortness of breath and wheezing.   Cardiovascular: Negative.  Negative for chest pain, palpitations and leg swelling.  Gastrointestinal: Negative.  Negative for abdominal pain, blood in stool, diarrhea, nausea and vomiting.  Genitourinary: Negative.  Negative for dysuria.  Musculoskeletal: Negative.  Negative for back pain.  Skin: Negative.   Neurological:  Negative for dizziness, tremors, speech change, focal weakness, seizures and headaches.  Endo/Heme/Allergies: Negative.  Does not bruise/bleed easily.  Psychiatric/Behavioral: Negative.  Negative for depression, hallucinations and suicidal ideas.     PHYSICAL EXAMINATION:  GENERAL:  53 y.o.-year-old patient lying in the bed with no acute distress.  NECK:  Supple, no jugular venous distention. No thyroid enlargement, no tenderness.  LUNGS: Normal breath sounds bilaterally, no wheezing, rales,rhonchi  No use of accessory muscles of respiration.  CARDIOVASCULAR: S1, S2 normal. No murmurs, rubs, or gallops.  ABDOMEN: Soft, non-tender, non-distended. Bowel sounds present. No organomegaly or mass.  EXTREMITIES: No pedal edema, cyanosis, or clubbing.  PSYCHIATRIC: The patient is alert and oriented x 3.  SKIN: No obvious rash, lesion, or ulcer.   DATA REVIEW:   CBC Recent Labs  Lab 10/24/18 1620  WBC 5.6  HGB 9.6*  HCT 30.1*  PLT 202    Chemistries  Recent Labs  Lab 10/25/18 0925  NA 135  K 4.5  CL 107  CO2 19*  GLUCOSE 131*  BUN 59*  CREATININE 2.15*  CALCIUM 9.1    Cardiac Enzymes No results for input(s): TROPONINI in the last 168 hours.  Microbiology Results  @MICRORSLT48 @  RADIOLOGY:  Dg Chest 2 View  Result Date:  10/24/2018 CLINICAL DATA:  Fever and chills EXAM: CHEST - 2 VIEW COMPARISON:  09/04/2018 FINDINGS: The heart size and mediastinal contours are within normal limits. AICD device projects over the left mid lung with lead in the right ventricle. Both lungs are clear. The visualized skeletal structures are unremarkable. IMPRESSION: No active cardiopulmonary disease. Electronically Signed   By: Ashley Royalty M.D.   On: 10/24/2018 22:24      Allergies as of 10/25/2018      Reactions   Levothyroxine Rash      Medication List    STOP taking these medications   spironolactone 25 MG tablet Commonly known as:  ALDACTONE     TAKE these medications   blood glucose meter kit and supplies Kit Dispense based on patient and insurance preference. Use up to four times daily as directed. (FOR ICD-9 250.00, 250.01).   carvedilol 12.5 MG tablet Commonly known as:  COREG Take 1 tablet (12.5 mg total) by mouth 2 (two) times daily.   cyclobenzaprine 5 MG tablet Commonly known as:  FLEXERIL Take 5 mg by mouth 3 (three) times daily as needed for muscle spasms.   dicyclomine 20 MG tablet Commonly known as:  BENTYL TAKE 1 TABLET (20 MG TOTAL) BY MOUTH 3 (THREE) TIMES DAILY AS NEEDED FOR SPASMS.   digoxin 0.125 MG tablet Commonly known as:  LANOXIN Take 1 tablet (0.125 mg total) by mouth daily.   empagliflozin 25 MG Tabs tablet Commonly known as:  JARDIANCE Take 25 mg by mouth daily.   famotidine 40 MG tablet Commonly known as:  PEPCID TAKE 1 TABLET BY MOUTH EVERY DAY IN THE EVENING   furosemide 40 MG tablet Commonly known as:  LASIX Take 1 tablet (40 mg total) by mouth daily.   Insulin Glargine 100 UNIT/ML Solostar Pen Commonly known as:  Lantus SoloStar Inject 20 Units into the skin daily at 10 pm.   Insulin Pen Needle 32G X 6 MM Misc 1 each by Does not apply route daily.   lamoTRIgine 25 MG tablet Commonly known as:  LaMICtal Take 1 tablet (25 mg total) by mouth 2 (two) times daily.    metoCLOPramide 10 MG tablet Commonly known as:  REGLAN Take 1  tablet (10 mg total) by mouth 3 (three) times daily with meals.   nitroGLYCERIN 0.4 MG SL tablet Commonly known as:  Nitrostat Place 1 tablet (0.4 mg total) under the tongue every 5 (five) minutes as needed for chest pain. What changed:  additional instructions   pantoprazole 40 MG tablet Commonly known as:  PROTONIX TAKE 1 TABLET BY MOUTH EVERY DAY   PARoxetine 30 MG tablet Commonly known as:  PAXIL Take 1 tablet (30 mg total) by mouth daily.   potassium chloride SA 20 MEQ tablet Commonly known as:  K-DUR,KLOR-CON Take 2 tablets (40 mEq total) by mouth 2 (two) times daily.   QUEtiapine 300 MG tablet Commonly known as:  SEROQUEL Take 1 tablet (300 mg total) by mouth at bedtime.   sacubitril-valsartan 49-51 MG Commonly known as:  ENTRESTO Take 1 tablet by mouth 2 (two) times daily.   sucralfate 1 g tablet Commonly known as:  CARAFATE TAKE 1 TABLET (1 G TOTAL) BY MOUTH 4 (FOUR) TIMES DAILY.          Management plans discussed with the patient and she is in agreement. Stable for discharge home  Patient should follow up with pcp  CODE STATUS:     Code Status Orders  (From admission, onward)         Start     Ordered   10/25/18 0151  Full code  Continuous     10/25/18 0150        Code Status History    Date Active Date Inactive Code Status Order ID Comments User Context   06/07/2018 1606 06/13/2018 1651 Full Code [id number]  Fritzi Mandes, MD Inpatient   05/26/2018 0253 06/01/2018 1823 Full Code [id number]  Amelia Jo, MD Inpatient   05/08/2018 1627 05/09/2018 1834 Full Code [id number]  Hillary Bow, MD ED   04/25/2018 2135 04/27/2018 1510 Full Code [id number]  Dustin Flock, MD Inpatient   07/03/2017 0100 07/05/2017 1806 Full Code [id number]  Harrie Foreman, MD Inpatient   09/17/2016 1712 09/18/2016 2025 Full Code [id number]  Fritzi Mandes, MD Inpatient   09/10/2016 1105 09/11/2016 2045 Full Code  [id number]  Dustin Flock, MD Inpatient   05/30/2016 1129 06/04/2016 2226 Full Code [id number]  Fritzi Mandes, MD Inpatient      TOTAL TIME TAKING CARE OF THIS PATIENT: 38 minutes.    Note: This dictation was prepared with Dragon dictation along with smaller phrase technology. Any transcriptional errors that result from this process are unintentional.  Cereniti Curb M.D on 10/25/2018 at 11:39 AM  Between 7am to 6pm - Pager - [phone] After 6pm go to [email or web address] - password [email or web address] [facility]  Office  [phone]  CC: Primary care physician; Valerie Roys, DO

## 2018-10-25 NOTE — Progress Notes (Signed)
   10/25/18 0945  Clinical Encounter Type  Visited With Patient  Visit Type Initial (order for advanced directive)  Referral From Nurse  Recommendations follow up Sunday 10/26/18   Chaplain responded to order regarding AD.  Patient expressed that she had just had some medication and needed to sleep as the night before had not been restful.  Patient would appreciate follow up on Sunday 10/26/18.

## 2018-10-27 ENCOUNTER — Telehealth: Payer: Self-pay

## 2018-10-27 ENCOUNTER — Other Ambulatory Visit: Payer: Self-pay

## 2018-10-27 DIAGNOSIS — N179 Acute kidney failure, unspecified: Secondary | ICD-10-CM

## 2018-10-27 NOTE — Telephone Encounter (Signed)
I have made the 1st attempt to contact the patient or family member in charge, in order to follow up from recently being discharged from the hospital. I left a message on voicemail but I will make another attempt at a different time.  

## 2018-10-27 NOTE — Telephone Encounter (Signed)
I have made the 2nd attempt to contact the patient or family member in charge, in order to follow up from recently being discharged from the hospital. I left a message on voicemail but I will make another attempt at a different time.  

## 2018-10-29 ENCOUNTER — Encounter: Payer: Self-pay | Admitting: Cardiology

## 2018-10-29 ENCOUNTER — Ambulatory Visit (INDEPENDENT_AMBULATORY_CARE_PROVIDER_SITE_OTHER): Payer: Medicare Other | Admitting: Gastroenterology

## 2018-10-29 ENCOUNTER — Other Ambulatory Visit (HOSPITAL_COMMUNITY): Payer: Self-pay | Admitting: Internal Medicine

## 2018-10-29 ENCOUNTER — Other Ambulatory Visit: Payer: Self-pay

## 2018-10-29 ENCOUNTER — Encounter: Payer: Self-pay | Admitting: Gastroenterology

## 2018-10-29 VITALS — BP 92/60 | HR 81 | Ht 65.0 in | Wt 196.6 lb

## 2018-10-29 DIAGNOSIS — R748 Abnormal levels of other serum enzymes: Secondary | ICD-10-CM | POA: Diagnosis not present

## 2018-10-29 DIAGNOSIS — K7469 Other cirrhosis of liver: Secondary | ICD-10-CM

## 2018-10-29 NOTE — Progress Notes (Signed)
[name], MD [address]  [place], [place] [address]  Main: [phone]  Fax: [phone]   Primary Care Physician: Valerie Roys, DO   Chief Complaint  Patient presents with  . Follow-up    cirrhosis    HPI: Bonnie Ochoa is a 53 y.o. female here for follow-up of cirrhosis.  Patient having episodes of confusion.  No episodes of bleeding. The patient denies abdominal or flank pain, anorexia, nausea or vomiting, dysphagia, change in bowel habits or black or bloody stools or weight loss.  Patient was recently admitted with AKI on CKD.   Has history of cirrhosis due to low EF of 25% requiring AICD.  She was previously scheduled for variceal screening and screening colonoscopy which had to be rescheduled due to admission for CHF around that time.  Current Outpatient Medications  Medication Sig Dispense Refill  . blood glucose meter kit and supplies KIT Dispense based on patient and insurance preference. Use up to four times daily as directed. (FOR ICD-9 250.00, 250.01). 1 each 0  . carvedilol (COREG) 12.5 MG tablet Take 1 tablet (12.5 mg total) by mouth 2 (two) times daily. 180 tablet 3  . cyclobenzaprine (FLEXERIL) 5 MG tablet Take 5 mg by mouth 3 (three) times daily as needed for muscle spasms.    Marland Kitchen dicyclomine (BENTYL) 20 MG tablet TAKE 1 TABLET (20 MG TOTAL) BY MOUTH 3 (THREE) TIMES DAILY AS NEEDED FOR SPASMS. 270 tablet 0  . digoxin (LANOXIN) 0.125 MG tablet Take 1 tablet (0.125 mg total) by mouth daily. 90 tablet 3  . empagliflozin (JARDIANCE) 25 MG TABS tablet Take 25 mg by mouth daily. 30 tablet 3  . famotidine (PEPCID) 40 MG tablet TAKE 1 TABLET BY MOUTH EVERY DAY IN THE EVENING 90 tablet 0  . furosemide (LASIX) 40 MG tablet Take 1 tablet (40 mg total) by mouth daily. 30 tablet 6  . Insulin Glargine (LANTUS SOLOSTAR) 100 UNIT/ML Solostar Pen Inject 20 Units into the skin daily at 10 pm. 5 pen 12  . Insulin Pen Needle 32G X 6 MM MISC 1 each by  Does not apply route daily. 100 each 12  . lamoTRIgine (LAMICTAL) 25 MG tablet Take 1 tablet (25 mg total) by mouth 2 (two) times daily. 180 tablet 1  . metoCLOPramide (REGLAN) 10 MG tablet Take 1 tablet (10 mg total) by mouth 3 (three) times daily with meals. 90 tablet 1  . nitroGLYCERIN (NITROSTAT) 0.4 MG SL tablet Place 1 tablet (0.4 mg total) under the tongue every 5 (five) minutes as needed for chest pain. (Patient taking differently: Place 0.4 mg under the tongue every 5 (five) minutes as needed for chest pain. Chest pain.) 30 tablet 0  . pantoprazole (PROTONIX) 40 MG tablet TAKE 1 TABLET BY MOUTH EVERY DAY 90 tablet 3  . PARoxetine (PAXIL) 30 MG tablet Take 1 tablet (30 mg total) by mouth daily. 90 tablet 1  . potassium chloride SA (K-DUR,KLOR-CON) 20 MEQ tablet Take 2 tablets (40 mEq total) by mouth 2 (two) times daily. 120 tablet 3  . QUEtiapine (SEROQUEL) 300 MG tablet Take 1 tablet (300 mg total) by mouth at bedtime. 90 tablet 1  . sacubitril-valsartan (ENTRESTO) 49-51 MG Take 1 tablet by mouth 2 (two) times daily. 60 tablet 3  . sucralfate (CARAFATE) 1 g tablet TAKE 1 TABLET (1 G TOTAL) BY MOUTH 4 (FOUR) TIMES DAILY. 360 tablet 3   No current facility-administered medications for this visit.  Allergies as of 10/29/2018 - Review Complete 10/29/2018  Allergen Reaction Noted  . Levothyroxine Rash 07/02/2017    ROS:  General: Negative for anorexia, weight loss, fever, chills, fatigue, weakness. ENT: Negative for hoarseness, difficulty swallowing , nasal congestion. CV: Negative for chest pain, angina, palpitations, dyspnea on exertion, peripheral edema.  Respiratory: Negative for dyspnea at rest, dyspnea on exertion, cough, sputum, wheezing.  GI: See history of present illness. GU:  Negative for dysuria, hematuria, urinary incontinence, urinary frequency, nocturnal urination.  Endo: Negative for unusual weight change.    Physical Examination:   BP 92/60   Pulse 81   Ht 5'  5" (1.651 m)   Wt 196 lb 9.6 oz (89.2 kg)   BMI 32.72 kg/m   General: Well-nourished, well-developed in no acute distress.  Eyes: No icterus. Conjunctivae pink. Mouth: Oropharyngeal mucosa moist and pink , no lesions erythema or exudate. Neck: Supple, Trachea midline Abdomen: Bowel sounds are normal, nontender, nondistended, no hepatosplenomegaly or masses, no abdominal bruits or hernia , no rebound or guarding.   Extremities: No lower extremity edema. No clubbing or deformities. Neuro: Alert and oriented x 3.  Grossly intact. Skin: Warm and dry, no jaundice.   Psych: Alert and cooperative, normal mood and affect.   Labs: CMP     Component Value Date/Time   NA 135 10/25/2018 0925   NA 140 07/30/2018 1102   K 4.5 10/25/2018 0925   CL 107 10/25/2018 0925   CO2 19 (L) 10/25/2018 0925   GLUCOSE 131 (H) 10/25/2018 0925   BUN 59 (H) 10/25/2018 0925   BUN 16 07/30/2018 1102   CREATININE 2.15 (H) 10/25/2018 0925   CALCIUM 9.1 10/25/2018 0925   PROT 8.0 09/04/2018 1036   PROT 8.5 07/30/2018 1102   ALBUMIN 4.1 09/04/2018 1036   ALBUMIN 4.4 07/30/2018 1102   AST 14 (L) 09/04/2018 1036   ALT 12 09/04/2018 1036   ALKPHOS 96 09/04/2018 1036   BILITOT 0.7 09/04/2018 1036   BILITOT 0.5 07/30/2018 1102   GFRNONAA 25 (L) 10/25/2018 0925   GFRAA 29 (L) 10/25/2018 0925   Lab Results  Component Value Date   WBC 5.6 10/24/2018   HGB 9.6 (L) 10/24/2018   HCT 30.1 (L) 10/24/2018   MCV 92.6 10/24/2018   PLT 202 10/24/2018    Imaging Studies: Dg Chest 2 View  Result Date: 10/24/2018 CLINICAL DATA:  Fever and chills EXAM: CHEST - 2 VIEW COMPARISON:  09/04/2018 FINDINGS: The heart size and mediastinal contours are within normal limits. AICD device projects over the left mid lung with lead in the right ventricle. Both lungs are clear. The visualized skeletal structures are unremarkable. IMPRESSION: No active cardiopulmonary disease. Electronically Signed   By: Ashley Royalty M.D.   On:  10/24/2018 22:24    Assessment and Plan:   Bonnie Ochoa is a 53 y.o. y/o female here for follow-up of cirrhosis due to low EF  We will repeat lab work today to [facility] from October 2019 was 11 We will repeat liver imaging as well as last one was in October 2019  Due to the coronavirus situation, we are unable to schedule elective procedures at this time and patient is variceal and colorectal cancer screening will need to be rescheduled to a later time  No episodes of bleeding, no indication for urgent procedures at this time  Continue follow-up with cardiology and nephrology  I have also asked her to get vaccination for hepatitis B at her primary  care doctor's office or at a pharmacy.  We do not have the hepatitis B vaccine alone, like to have the hepatitis A and B vaccination.  She is already immunized for hepatitis a based on her lab work from August 2019.  No signs of decompensation at this time Continue to avoid hepatotoxic drugs Sodium restriction to less than 2 g a day  Continue clinic follow-up  Dr [name]

## 2018-10-30 ENCOUNTER — Telehealth: Payer: Medicare Other

## 2018-10-30 ENCOUNTER — Other Ambulatory Visit: Payer: Self-pay

## 2018-10-30 ENCOUNTER — Telehealth: Payer: Self-pay

## 2018-10-30 DIAGNOSIS — K7469 Other cirrhosis of liver: Secondary | ICD-10-CM

## 2018-10-30 LAB — HEPATIC FUNCTION PANEL
ALT: 12 IU/L (ref 0–32)
AST: 7 IU/L (ref 0–40)
Albumin: 4.3 g/dL (ref 3.8–4.9)
Alkaline Phosphatase: 120 IU/L — ABNORMAL HIGH (ref 39–117)
Bilirubin Total: 0.3 mg/dL (ref 0.0–1.2)
Bilirubin, Direct: 0.09 mg/dL (ref 0.00–0.40)
Total Protein: 8.3 g/dL (ref 6.0–8.5)

## 2018-10-30 LAB — PROTIME-INR
INR: 0.9 (ref 0.8–1.2)
Prothrombin Time: 9.8 s (ref 9.1–12.0)

## 2018-10-30 NOTE — Telephone Encounter (Signed)
I spoke with pt's wife and informed her of pt's RUQ ultrasound schedule for [facility] on 12/01/2018 at 8:30 am. Arrival time 8:15 am at the [facility] mall. Nothing to eat or drink after midnight prior to ultrasound.

## 2018-10-31 ENCOUNTER — Ambulatory Visit: Payer: Medicare Other | Admitting: *Deleted

## 2018-10-31 ENCOUNTER — Telehealth: Payer: Self-pay

## 2018-10-31 ENCOUNTER — Other Ambulatory Visit: Payer: Self-pay

## 2018-10-31 DIAGNOSIS — E1122 Type 2 diabetes mellitus with diabetic chronic kidney disease: Secondary | ICD-10-CM

## 2018-10-31 DIAGNOSIS — Z794 Long term (current) use of insulin: Principal | ICD-10-CM

## 2018-10-31 DIAGNOSIS — I5022 Chronic systolic (congestive) heart failure: Secondary | ICD-10-CM

## 2018-10-31 DIAGNOSIS — N183 Chronic kidney disease, stage 3 (moderate): Principal | ICD-10-CM

## 2018-10-31 DIAGNOSIS — R748 Abnormal levels of other serum enzymes: Secondary | ICD-10-CM

## 2018-10-31 NOTE — Progress Notes (Signed)
No ICM remote transmission received for 10/22/2018 and next ICM transmission scheduled for 11/10/2018.

## 2018-10-31 NOTE — Telephone Encounter (Signed)
[facility]ed [facility] center and added Creatinine level and sodium to blood work.

## 2018-10-31 NOTE — Chronic Care Management (AMB) (Signed)
  Care Management Note   Bonnie Ochoa is a 53 y.o. year old female who is a primary care patient of Dorcas Carrow, DO. The CM team was consulted for assistance with chronic disease management and care coordination, by [facility] CM hospital liaison.   I reached out to [employer] by phone today. Patient's spouse/DPR Latricia answered the phone and spoke on behalf of the patient.  Introduced TEFL teacher at [employer]. Patient's spouse stated patient was doing well after recent hospitalization. Spouse reports weights have been steady, they have pcp appointment scheduled and they have all ordered medications. Spouse stated they have an EMT who calls and visits to assist with monitoring. Spouse stated they have transportation to appointments and they have no medication questions for CCM Pharmacist at this time.     Follow Up Plan: The patient has been provided with contact information for the chronic care management team and has been advised to call with any health related questions or concerns.   Ma Rings Monicka Cyran RN, BSN Nurse Case Education officer, [facility]  [phone]) Business Mobile

## 2018-10-31 NOTE — Patient Instructions (Signed)
Patient declined CCM services at this time.

## 2018-10-31 NOTE — Telephone Encounter (Signed)
-----   Message from Pasty Spillers, MD sent at 10/30/2018 12:19 PM EDT ----- Bonnie Ochoa can you call lab and ask them if they can creatinine level and sodium to this lab that was just drawn yesterday please. They may say they can add on BMP to this, which is fine too. Thank you.

## 2018-11-01 LAB — SPECIMEN STATUS REPORT

## 2018-11-01 LAB — SODIUM: Sodium: 137 mmol/L (ref 134–144)

## 2018-11-01 LAB — CREATININE, SERUM
Creatinine, Ser: 1.79 mg/dL — ABNORMAL HIGH (ref 0.57–1.00)
GFR calc Af Amer: 37 mL/min/{1.73_m2} — ABNORMAL LOW (ref >59)
GFR calc non Af Amer: 32 mL/min/{1.73_m2} — ABNORMAL LOW (ref >59)

## 2018-11-03 ENCOUNTER — Encounter: Payer: Self-pay | Admitting: Family Medicine

## 2018-11-03 ENCOUNTER — Ambulatory Visit (INDEPENDENT_AMBULATORY_CARE_PROVIDER_SITE_OTHER): Payer: Medicare Other | Admitting: Family Medicine

## 2018-11-03 ENCOUNTER — Other Ambulatory Visit: Payer: Self-pay

## 2018-11-03 VITALS — BP 96/63 | HR 73 | Temp 98.6°F | Ht 65.0 in | Wt 201.0 lb

## 2018-11-03 DIAGNOSIS — Z794 Long term (current) use of insulin: Secondary | ICD-10-CM | POA: Diagnosis not present

## 2018-11-03 DIAGNOSIS — K7469 Other cirrhosis of liver: Secondary | ICD-10-CM | POA: Diagnosis not present

## 2018-11-03 DIAGNOSIS — N183 Chronic kidney disease, stage 3 unspecified: Secondary | ICD-10-CM

## 2018-11-03 DIAGNOSIS — N179 Acute kidney failure, unspecified: Secondary | ICD-10-CM

## 2018-11-03 DIAGNOSIS — Z1239 Encounter for other screening for malignant neoplasm of breast: Secondary | ICD-10-CM | POA: Diagnosis not present

## 2018-11-03 DIAGNOSIS — Z23 Encounter for immunization: Secondary | ICD-10-CM | POA: Diagnosis not present

## 2018-11-03 DIAGNOSIS — I129 Hypertensive chronic kidney disease with stage 1 through stage 4 chronic kidney disease, or unspecified chronic kidney disease: Secondary | ICD-10-CM | POA: Diagnosis not present

## 2018-11-03 DIAGNOSIS — E1122 Type 2 diabetes mellitus with diabetic chronic kidney disease: Secondary | ICD-10-CM

## 2018-11-03 DIAGNOSIS — I200 Unstable angina: Secondary | ICD-10-CM

## 2018-11-03 LAB — BAYER DCA HB A1C WAIVED: HB A1C (BAYER DCA - WAIVED): 8.3 % — ABNORMAL HIGH (ref <7.0)

## 2018-11-03 MED ORDER — INSULIN GLARGINE 100 UNIT/ML SOLOSTAR PEN: 20.0000 [IU] | PEN_INJECTOR | Freq: Every day | SUBCUTANEOUS | 12 refills | Status: DC

## 2018-11-03 MED ORDER — INSULIN PEN NEEDLE 32G X 6 MM MISC: 1.0000 | Freq: Every day | 12 refills | Status: DC

## 2018-11-03 MED ORDER — METOCLOPRAMIDE HCL 10 MG PO TABS: 10.0000 mg | ORAL_TABLET | Freq: Three times a day (TID) | ORAL | 1 refills | Status: DC

## 2018-11-03 MED ORDER — POTASSIUM CHLORIDE CRYS ER 20 MEQ PO TBCR: 40.0000 meq | EXTENDED_RELEASE_TABLET | Freq: Two times a day (BID) | ORAL | 1 refills | Status: DC

## 2018-11-03 MED ORDER — FAMOTIDINE 40 MG PO TABS: ORAL_TABLET | ORAL | 1 refills | Status: DC

## 2018-11-03 MED ORDER — SACUBITRIL-VALSARTAN 49-51 MG PO TABS: 1.0000 | ORAL_TABLET | Freq: Two times a day (BID) | ORAL | 1 refills | Status: DC

## 2018-11-03 MED ORDER — EMPAGLIFLOZIN 25 MG PO TABS: 25.0000 mg | ORAL_TABLET | Freq: Every day | ORAL | 1 refills | Status: DC

## 2018-11-03 MED ORDER — ATORVASTATIN CALCIUM 40 MG PO TABS: 40.0000 mg | ORAL_TABLET | Freq: Every day | ORAL | 1 refills | Status: DC

## 2018-11-03 MED ORDER — DULAGLUTIDE 0.75 MG/0.5ML ~~LOC~~ SOAJ: 0.7500 mg | SUBCUTANEOUS | 3 refills | Status: DC

## 2018-11-03 NOTE — Assessment & Plan Note (Signed)
No fluid wave, some mild swelling- watch salt. Now off spironalactone. BP to soft to restart it. Monitor closely.

## 2018-11-03 NOTE — Patient Instructions (Signed)
Hepatitis B Vaccine: What You Need to Know  1. Why get vaccinated?  Hepatitis B vaccine can prevent hepatitis B. Hepatitis B is a liver disease that can cause mild illness lasting a few weeks, or it can lead to a serious, lifelong illness.  · Acute hepatitis B infection is a short-term illness that can lead to fever, fatigue, loss of appetite, nausea, vomiting, jaundice (yellow skin or eyes, dark urine, clay-colored bowel movements), and pain in the muscles, joints, and stomach.  · Chronic hepatitis B infection is a long-term illness that occurs when the hepatitis B virus remains in a person's body. Most people who go on to develop chronic hepatitis B do not have symptoms, but it is still very serious and can lead to liver damage (cirrhosis), liver cancer, and death. Chronically-infected people can spread hepatitis B virus to others, even if they do not feel or look sick themselves.  Hepatitis B is spread when blood, semen, or other body fluid infected with the hepatitis B virus enters the body of a person who is not infected. People can become infected through:  · Birth (if a mother has hepatitis B, her baby can become infected)  · Sharing items such as razors or toothbrushes with an infected person  · Contact with the blood or open sores of an infected person  · Sex with an infected partner  · Sharing needles, syringes, or other drug-injection equipment  · Exposure to blood from needlesticks or other sharp instruments  Most people who are vaccinated with hepatitis B vaccine are immune for life.  2. Hepatitis B vaccine  Hepatitis B vaccine is usually given as 2, 3, or 4 shots.  Infants should get their first dose of hepatitis B vaccine at birth and will usually complete the series at 6 months of age (sometimes it will take longer than 6 months to complete the series).  Children and adolescents younger than 19 years of age who have not yet gotten the vaccine should also be vaccinated.  Hepatitis B vaccine is also  recommended for certain unvaccinated adults:  · People whose sex partners have hepatitis B  · Sexually active persons who are not in a long-term monogamous relationship  · Persons seeking evaluation or treatment for a sexually transmitted disease  · Men who have sexual contact with other men  · People who share needles, syringes, or other drug-injection equipment  · People who have household contact with someone infected with the hepatitis B virus  · Health care and public safety workers at risk for exposure to blood or body fluids  · Residents and staff of facilities for developmentally disabled persons  · Persons in correctional facilities  · Victims of sexual assault or abuse  · Travelers to regions with increased rates of hepatitis B  · People with chronic liver disease, kidney disease, HIV infection, infection with hepatitis C, or diabetes  · Anyone who wants to be protected from hepatitis B  Hepatitis B vaccine may be given at the same time as other vaccines.  3. Talk with your health care provider  Tell your vaccine provider if the person getting the vaccine:  · Has had an allergic reaction after a previous dose of hepatitis B vaccine, or has any severe, life-threatening allergies.  In some cases, your health care provider may decide to postpone hepatitis B vaccination to a future visit.  People with minor illnesses, such as a cold, may be vaccinated. People who are moderately or severely ill   should usually wait until they recover before getting hepatitis B vaccine.  Your health care provider can give you more information.  4. Risks of a vaccine reaction  · Soreness where the shot is given or fever can happen after hepatitis B vaccine.  People sometimes faint after medical procedures, including vaccination. Tell your provider if you feel dizzy or have vision changes or ringing in the ears.  As with any medicine, there is a very remote chance of a vaccine causing a severe allergic reaction, other serious  injury, or death.  5. What if there is a serious problem?  An allergic reaction could occur after the vaccinated person leaves the clinic. If you see signs of a severe allergic reaction (hives, swelling of the face and throat, difficulty breathing, a fast heartbeat, dizziness, or weakness), call 9-1-1 and get the person to the nearest hospital.  For other signs that concern you, call your health care provider.  Adverse reactions should be reported to the Vaccine Adverse Event Reporting System (VAERS). Your health care provider will usually file this report, or you can do it yourself. Visit the VAERS website at www.vaers.hhs.gov or call 1-800-822-7967.VAERS is only for reporting reactions, and VAERS staff do not give medical advice.  6. The National Vaccine Injury Compensation Program  The National Vaccine Injury Compensation Program (VICP) is a federal program that was created to compensate people who may have been injured by certain vaccines. Visit the VICP website at www.hrsa.gov/vaccinecompensation or call 1-800-338-2382 to learn about the program and about filing a claim. There is a time limit to file a claim for compensation.  7. How can I learn more?  · Ask your healthcare provider.  · Call your local or state health department.  · Contact the Centers for Disease Control and Prevention (CDC):  ? Call 1-800-232-4636 (1-800-CDC-INFO) or  ? Visit CDC's www.cdc.gov/vaccines  CDC Vaccine Information Statement (Interim) Hepatitis B Vaccine (03/27/2018)  This information is not intended to replace advice given to you by your health care provider. Make sure you discuss any questions you have with your health care provider.  Document Released: 05/24/2006 Document Revised: 04/02/2018 Document Reviewed: 04/02/2018  Elsevier Interactive Patient Education © 2019 [employer].

## 2018-11-03 NOTE — Assessment & Plan Note (Signed)
Running on the low side. Already off her spironalactone. Has some mild swelling in her belly. Watch salt. Continue to monitor. Follow up with cardiology as needed.

## 2018-11-03 NOTE — Progress Notes (Signed)
BP 96/63   Pulse 73   Temp 98.6 F (37 C) (Oral)   Ht _0  (1.651 m)   Wt 201 lb (91.2 kg)   SpO2 98%   BMI 33.45 kg/m    Subjective:    Patient ID: Bonnie Ochoa, female    DOB: [birth year], 53 y.o.   MRN: [id number]  HPI: Bonnie Ochoa is a 53 y.o. female  Chief Complaint  Patient presents with  . Hospitalization Follow-up   Transition of Care Hospital Follow up.   Hospital/Facility: [facility] D/C Physician: Dr. Benjie Karvonen D/C Date: 10/25/18  Records Requested: 10/25/18 Records Received: 10/25/18 Records Reviewed: 10/25/18  Diagnoses on Discharge: Acute on chronic renal failure  Date of interactive Contact within 48 hours of discharge: 10/27/18- attempted x3, patient never called back/answered phone Contact was through: phone  Date of 7 day or 14 day face-to-face visit: 11/03/18   within 14 days  Outpatient Encounter Medications as of 11/03/2018  Medication Sig  . atorvastatin (LIPITOR) 40 MG tablet Take 1 tablet (40 mg total) by mouth daily at 6 PM.  . blood glucose meter kit and supplies KIT Dispense based on patient and insurance preference. Use up to four times daily as directed. (FOR ICD-9 250.00, 250.01).  . carvedilol (COREG) 12.5 MG tablet Take 1 tablet (12.5 mg total) by mouth 2 (two) times daily.  . cyclobenzaprine (FLEXERIL) 5 MG tablet Take 5 mg by mouth 3 (three) times daily as needed for muscle spasms.  Marland Kitchen dicyclomine (BENTYL) 20 MG tablet TAKE 1 TABLET (20 MG TOTAL) BY MOUTH 3 (THREE) TIMES DAILY AS NEEDED FOR SPASMS.  Marland Kitchen digoxin (LANOXIN) 0.125 MG tablet Take 1 tablet (0.125 mg total) by mouth daily.  . empagliflozin (JARDIANCE) 25 MG TABS tablet Take 25 mg by mouth daily.  . famotidine (PEPCID) 40 MG tablet TAKE 1 TABLET BY MOUTH EVERY DAY IN THE EVENING  . furosemide (LASIX) 40 MG tablet Take 1 tablet (40 mg total) by mouth daily.  . Insulin Glargine (LANTUS SOLOSTAR) 100 UNIT/ML Solostar Pen Inject 20 Units into the skin daily at 10 pm.  . Insulin Pen Needle 32G X  6 MM MISC 1 each by Does not apply route daily.  Marland Kitchen lamoTRIgine (LAMICTAL) 25 MG tablet Take 1 tablet (25 mg total) by mouth 2 (two) times daily.  . metoCLOPramide (REGLAN) 10 MG tablet Take 1 tablet (10 mg total) by mouth 3 (three) times daily with meals.  . nitroGLYCERIN (NITROSTAT) 0.4 MG SL tablet Place 1 tablet (0.4 mg total) under the tongue every 5 (five) minutes as needed for chest pain. (Patient taking differently: Place 0.4 mg under the tongue every 5 (five) minutes as needed for chest pain. Chest pain.)  . pantoprazole (PROTONIX) 40 MG tablet TAKE 1 TABLET BY MOUTH EVERY DAY  . PARoxetine (PAXIL) 30 MG tablet Take 1 tablet (30 mg total) by mouth daily.  . potassium chloride SA (K-DUR,KLOR-CON) 20 MEQ tablet Take 2 tablets (40 mEq total) by mouth 2 (two) times daily.  . QUEtiapine (SEROQUEL) 300 MG tablet Take 1 tablet (300 mg total) by mouth at bedtime.  . sacubitril-valsartan (ENTRESTO) 49-51 MG Take 1 tablet by mouth 2 (two) times daily.  . sucralfate (CARAFATE) 1 g tablet TAKE 1 TABLET (1 G TOTAL) BY MOUTH 4 (FOUR) TIMES DAILY.  . [DISCONTINUED] atorvastatin (LIPITOR) 40 MG tablet   . [DISCONTINUED] empagliflozin (JARDIANCE) 25 MG TABS tablet Take 25 mg by mouth daily.  . [DISCONTINUED] famotidine (PEPCID) 40 MG tablet TAKE  1 TABLET BY MOUTH EVERY DAY IN THE EVENING  . [DISCONTINUED] Insulin Glargine (LANTUS SOLOSTAR) 100 UNIT/ML Solostar Pen Inject 20 Units into the skin daily at 10 pm.  . [DISCONTINUED] Insulin Pen Needle 32G X 6 MM MISC 1 each by Does not apply route daily.  . [DISCONTINUED] metoCLOPramide (REGLAN) 10 MG tablet Take 1 tablet (10 mg total) by mouth 3 (three) times daily with meals.  . [DISCONTINUED] potassium chloride SA (K-DUR,KLOR-CON) 20 MEQ tablet Take 2 tablets (40 mEq total) by mouth 2 (two) times daily.  . [DISCONTINUED] sacubitril-valsartan (ENTRESTO) 49-51 MG Take 1 tablet by mouth 2 (two) times daily.  . Dulaglutide (TRULICITY) 5.40 JW/1.1BJ SOPN Inject  0.75 mg into the skin once a week.   No facility-administered encounter medications on file as of 11/03/2018.    HOSPITAL COURSE:   53 year old female with a history of chronic systolic and diastolic heart failure, hypertension and hypothyroidism who presented to the ER due to request of her physician for elevated creatinine and hyperkalemia.  1.  Acute on chronic kidney disease stage III: Creatinine is elevated due to dehydration as well as medications.  Creatinine has improved with IV fluids.  She will discontinue Aldactone for now.  As per nephrology continue Lasix.  She will follow-up with nephrology in 3 days for repeat creatinine.  She is asked to avoid nephrotoxic medications.  2.  Hyperkalemia: This is treated.  3.  Chronic combined systolic and diastolic heart failure without signs of exacerbation: She will continue on Lasix.  She will need to follow-up with her cardiologist as an outpatient to restart Aldactone if indicated. Continue Coreg 4.  Diabetes: Continue outpatient regimen with ADA diet   Diagnostic Tests Reviewed:  CLINICAL DATA:  Fever and chills  EXAM: CHEST - 2 VIEW  COMPARISON:  09/04/2018  FINDINGS: The heart size and mediastinal contours are within normal limits. AICD device projects over the left mid lung with lead in the right ventricle. Both lungs are clear. The visualized skeletal structures are unremarkable.  IMPRESSION: No active cardiopulmonary disease.  Disposition: Home  Consults: Nephrology  Discharge Instructions: Follow up here and with nephrology  Disease/illness Education: Discussed today  Home Health/Community Services Discussions/Referrals: Chronic care management in place  Establishment or re-establishment of referral orders for community resources: Chronic care management in place  Discussion with other health care providers: Call to Nephrology- they don't need to follow up with her as long as they get her BMP   Assessment and Support of treatment regimen adherence:  [place] with: Patient   Education for self-management, independent living, and ADLs: Given today  Has seen GI since her discharge- needs colonoscopy but cannot have one due to corona virus situation preventing elective procedures- she will get this when things calm down. Due to see her kidney doctor- but doesn't know when. She is felling well since getting out of the hospital. She has no concerns.   HYPERTENSION / HYPERLIPIDEMIA Satisfied with current treatment? yes Duration of hypertension: chronic BP monitoring frequency: rarely BP medication side effects: no Past BP meds: carvedilol, digoxin, lasix, entresto Duration of hyperlipidemia: chronic Cholesterol medication side effects: no Cholesterol supplements: none Past cholesterol medications: atorvastatin Medication compliance: good compliance Aspirin: no Recent stressors: no Recurrent headaches: no Visual changes: no Palpitations: no Dyspnea: no Chest pain: no Lower extremity edema: no Dizzy/lightheaded: no  DIABETES Hypoglycemic episodes:no Polydipsia/polyuria: no Visual disturbance: no Chest pain: no Paresthesias: no Glucose Monitoring: no  Accucheck frequency: Not Checking Taking  Insulin?: yes Blood Pressure Monitoring: not checking Retinal Examination: Up to Date Foot Exam: Up to Date Diabetic Education: Completed Pneumovax: Up to Date Influenza: Up to Date Aspirin: no   Relevant past medical, surgical, family and social history reviewed and updated as indicated. Interim medical history since our last visit reviewed. Allergies and medications reviewed and updated.  Review of Systems  Constitutional: Negative.   Respiratory: Negative.   Cardiovascular: Negative.   Gastrointestinal: Positive for abdominal pain. Negative for abdominal distention, anal bleeding, blood in stool, constipation, diarrhea, nausea, rectal pain and  vomiting.  Genitourinary: Negative.   Skin: Negative.   Neurological: Negative.   Psychiatric/Behavioral: Negative.     Per HPI unless specifically indicated above     Objective:    BP 96/63   Pulse 73   Temp 98.6 F (37 C) (Oral)   Ht _0  (1.651 m)   Wt 201 lb (91.2 kg)   SpO2 98%   BMI 33.45 kg/m   Wt Readings from Last 3 Encounters:  11/03/18 201 lb (91.2 kg)  10/29/18 196 lb 9.6 oz (89.2 kg)  10/24/18 191 lb (86.6 kg)    Physical Exam Vitals signs and nursing note reviewed.  Constitutional:      General: She is not in acute distress.    Appearance: Normal appearance. She is not ill-appearing, toxic-appearing or diaphoretic.  HENT:     Head: Normocephalic and atraumatic.     Right Ear: External ear normal.     Left Ear: External ear normal.     Nose: Nose normal.     Mouth/Throat:     Mouth: Mucous membranes are moist.     Pharynx: Oropharynx is clear.  Eyes:     General: No scleral icterus.       Right eye: No discharge.        Left eye: No discharge.     Extraocular Movements: Extraocular movements intact.     Conjunctiva/sclera: Conjunctivae normal.     Pupils: Pupils are equal, round, and reactive to light.  Neck:     Musculoskeletal: Normal range of motion and neck supple.  Cardiovascular:     Rate and Rhythm: Normal rate and regular rhythm.     Pulses: Normal pulses.     Heart sounds: Normal heart sounds. No murmur. No friction rub. No gallop.   Pulmonary:     Effort: Pulmonary effort is normal. No respiratory distress.     Breath sounds: Normal breath sounds. No stridor. No wheezing, rhonchi or rales.  Chest:     Chest wall: No tenderness.  Abdominal:     General: Bowel sounds are normal. There is distension.     Palpations: There is no mass.     Tenderness: There is no abdominal tenderness. There is no right CVA tenderness, left CVA tenderness, guarding or rebound.     Hernia: No hernia is present.  Musculoskeletal: Normal range of motion.   Skin:    General: Skin is warm and dry.     Capillary Refill: Capillary refill takes less than 2 seconds.     Coloration: Skin is not jaundiced or pale.     Findings: No bruising, erythema, lesion or rash.  Neurological:     General: No focal deficit present.     Mental Status: She is alert and oriented to person, place, and time. Mental status is at baseline.  Psychiatric:        Mood and Affect: Mood normal.  Behavior: Behavior normal.        Thought Content: Thought content normal.        Judgment: Judgment normal.     Results for orders placed or performed in visit on 11/03/18  Bayer DCA Hb A1c Waived  Result Value Ref Range   HB A1C (BAYER DCA - WAIVED) 8.3 (H) <7.0 %      Assessment & Plan:   Problem List Items Addressed This Visit      Digestive   Cirrhosis ([place])    No fluid wave, some mild swelling- watch salt. Now off spironalactone. BP to soft to restart it. Monitor closely.        Endocrine   DM (diabetes mellitus), type 2 with renal complications ([place])    Going in the wrong direction with A1c up to 8.5 from 7.9. Will start trulicity and recheck 1 month. 1st shot given in the office today. Continue other medications. Call with any concerns.       Relevant Medications   Dulaglutide (TRULICITY) 9.53 KV/2.2HC SOPN   atorvastatin (LIPITOR) 40 MG tablet   empagliflozin (JARDIANCE) 25 MG TABS tablet   Insulin Glargine (LANTUS SOLOSTAR) 100 UNIT/ML Solostar Pen   Other Relevant Orders   Bayer DCA Hb A1c Waived (Completed)   Comprehensive metabolic panel   Lipid Panel w/o Chol/HDL Ratio     Genitourinary   Benign hypertensive renal disease    Running on the low side. Already off her spironalactone. Has some mild swelling in her belly. Watch salt. Continue to monitor. Follow up with cardiology as needed.       Relevant Orders   Comprehensive metabolic panel   Lipid Panel w/o Chol/HDL Ratio    Other Visit Diagnoses    AKI (acute kidney injury) ([place])     -  Primary   Rechecking labs today. Doing well following discharge. Continue to monitor.    Need for hepatitis B booster vaccination       Hep B #1 given today.   Breast cancer screening       Mammogram ordered today.       Follow up plan: Return in about 4 weeks (around 12/01/2018) for follow up on trulicity.

## 2018-11-03 NOTE — Assessment & Plan Note (Addendum)
Going in the wrong direction with A1c up to 8.3 from 7.9. Will start trulicity and recheck 1 month. 1st shot given in the office today. Continue other medications. Call with any concerns.

## 2018-11-04 LAB — COMPREHENSIVE METABOLIC PANEL
ALT: 10 IU/L (ref 0–32)
AST: 20 IU/L (ref 0–40)
Albumin/Globulin Ratio: 1.2 (ref 1.2–2.2)
Albumin: 4 g/dL (ref 3.8–4.9)
Alkaline Phosphatase: 104 IU/L (ref 39–117)
BUN/Creatinine Ratio: 17 (ref 9–23)
BUN: 23 mg/dL (ref 6–24)
Bilirubin Total: <0.2 mg/dL (ref 0.0–1.2)
CHLORIDE: 100 mmol/L (ref 96–106)
CO2: 22 mmol/L (ref 20–29)
Calcium: 9.1 mg/dL (ref 8.7–10.2)
Creatinine, Ser: 1.35 mg/dL — ABNORMAL HIGH (ref 0.57–1.00)
GFR calc Af Amer: 51 mL/min/{1.73_m2} — ABNORMAL LOW (ref >59)
GFR calc non Af Amer: 45 mL/min/{1.73_m2} — ABNORMAL LOW (ref >59)
Globulin, Total: 3.3 g/dL (ref 1.5–4.5)
Glucose: 280 mg/dL — ABNORMAL HIGH (ref 65–99)
Potassium: 3.5 mmol/L (ref 3.5–5.2)
Sodium: 141 mmol/L (ref 134–144)
Total Protein: 7.3 g/dL (ref 6.0–8.5)

## 2018-11-04 LAB — LIPID PANEL W/O CHOL/HDL RATIO
Cholesterol, Total: 303 mg/dL — ABNORMAL HIGH (ref 100–199)
HDL: 24 mg/dL — AB (ref >39)
Triglycerides: 1246 mg/dL (ref 0–149)

## 2018-11-05 ENCOUNTER — Telehealth: Payer: Self-pay | Admitting: Family Medicine

## 2018-11-05 NOTE — Telephone Encounter (Signed)
Please let Lauraann know that her kidney function looks better, but her cholesterol looks a lot worse. Can we check to see if she's taking her medicine?

## 2018-11-05 NOTE — Telephone Encounter (Signed)
Message relayed to patient. Verbalized understanding. Pt has been taking cholesterol medication daily.

## 2018-11-05 NOTE — Telephone Encounter (Signed)
Left message on machine for pt to return call to the office.  

## 2018-11-06 MED ORDER — ATORVASTATIN CALCIUM 80 MG PO TABS: 80.0000 mg | ORAL_TABLET | Freq: Every day | ORAL | 1 refills | Status: DC

## 2018-11-06 NOTE — Telephone Encounter (Signed)
Latricia called in returning call advised of Dr Henriette Combs message and let her know med was sent to pharmacy.

## 2018-11-06 NOTE — Telephone Encounter (Signed)
Called and left patient a VM asking for her to please return my call.  

## 2018-11-06 NOTE — Telephone Encounter (Signed)
Please have her increase to 80mg  of atorvastatin daily and we'll recheck at her follow up. Thanks!

## 2018-11-10 ENCOUNTER — Other Ambulatory Visit: Payer: Self-pay

## 2018-11-11 ENCOUNTER — Telehealth: Payer: Self-pay

## 2018-11-11 NOTE — Telephone Encounter (Signed)
Left message for patient to remind of missed remote transmission.  

## 2018-11-17 NOTE — Progress Notes (Signed)
No ICM remote transmission received for 11/10/2018 and next ICM transmission scheduled for 12/01/2018.   

## 2018-11-25 ENCOUNTER — Telehealth: Payer: Self-pay

## 2018-11-25 ENCOUNTER — Ambulatory Visit: Admission: RE | Admit: 2018-11-25 | Payer: Medicare Other | Source: Home / Self Care | Admitting: Ophthalmology

## 2018-11-25 ENCOUNTER — Encounter: Admission: RE | Payer: Self-pay | Source: Home / Self Care

## 2018-11-25 ENCOUNTER — Telehealth (HOSPITAL_COMMUNITY): Payer: Self-pay

## 2018-11-25 SURGERY — PHACOEMULSIFICATION, CATARACT, WITH IOL INSERTION
Anesthesia: Choice | Laterality: Right

## 2018-11-25 NOTE — Telephone Encounter (Signed)
Virtual Visit Pre-Appointment Phone Call  Steps For Call:  1. Confirm consent - "In the setting of the current Covid19 crisis, you are scheduled for a TELEPHONE visit with your provider on 12/01/2018 at 4:00pm.  Just as we do with many in-office visits, in order for you to participate in this visit, we must obtain consent.  If you'd like, I can send this to your mychart (if signed up) or email for you to review.  Otherwise, I can obtain your verbal consent now.  All virtual visits are billed to your insurance company just like a normal visit would be.  By agreeing to a virtual visit, we'd like you to understand that the technology does not allow for your provider to perform an examination, and thus may limit your provider's ability to fully assess your condition.  Finally, though the technology is pretty good, we cannot assure that it will always work on either your or our end, and in the setting of a video visit, we may have to convert it to a phone-only visit.  In either situation, we cannot ensure that we have a secure connection.  Are you willing to proceed?" STAFF: Did the patient verbally acknowledge consent to telehealth visit? Document YES/NO here: YES  2. Confirm the BEST phone number to call the day of the visit by including in appointment notes  3. Give patient instructions for WebEx/MyChart download to smartphone as below or Doximity/Doxy.me if video visit (depending on what platform provider is using)  4. Advise patient to be prepared with their blood pressure, heart rate, weight, any heart rhythm information, their current medicines, and a piece of paper and pen handy for any instructions they may receive the day of their visit  5. Inform patient they will receive a phone call 15 minutes prior to their appointment time (may be from unknown caller ID) so they should be prepared to answer  6. Confirm that appointment type is correct in Epic appointment notes (VIDEO vs PHONE)      TELEPHONE CALL NOTE  Alicianna Connelley has been deemed a candidate for a follow-up tele-health visit to limit community exposure during the Covid-19 pandemic. I spoke with the patient via phone to ensure availability of phone/video source, confirm preferred email & phone number, and discuss instructions and expectations.  I reminded Bonnie Ochoa to be prepared with any vital sign and/or heart rhythm information that could potentially be obtained via home monitoring, at the time of her visit. I reminded Yesmin Bishoff to expect a phone call at the time of her visit if her visit.  Margrett Rud, [place] 11/25/2018 4:32 PM   INSTRUCTIONS FOR DOWNLOADING THE WEBEX APP TO SMARTPHONE  - If Apple, ask patient to go to [employer] and type in WebEx in the search bar. Download [employer], the blue/green circle. If Android, go to [employer] and type in [employer] in the search bar. The app is free but as with any other app downloads, their phone may require them to verify saved payment information or Apple/Android password.  - The patient does NOT have to create an account. - On the day of the visit, the assist will walk the patient through joining the meeting with the meeting number/password.  INSTRUCTIONS FOR DOWNLOADING THE MYCHART APP TO SMARTPHONE  - The patient must first make sure to have activated MyChart and know their login information - If Apple, go to [employer] and type in MyChart in the search  bar and download the app. If Android, ask patient to go to [employer] and type in [place] in the search bar and download the app. The app is free but as with any other app downloads, their phone may require them to verify saved payment information or Apple/Android password.  - The patient will need to then log into the app with their MyChart username and password, and select [facility] as their healthcare provider to link the account. When it is time for your visit, go to the MyChart app,  find appointments, and click Begin Video Visit. Be sure to Select Allow for your device to access the Microphone and Camera for your visit. You will then be connected, and your provider will be with you shortly.  **If they have any issues connecting, or need assistance please contact MyChart service desk [phone])**  **If using a computer, in order to ensure the best quality for their visit they will need to use either of the following Internet Browsers: [employer], or Google Chrome**  IF USING DOXIMITY or DOXY.ME - The patient will receive a link just prior to their visit, either by text or email (to be determined day of appointment depending on if it's doxy.me or Doximity).     FULL LENGTH CONSENT FOR TELE-HEALTH VISIT   I hereby voluntarily request, consent and authorize [facility] and its employed or contracted physicians, physician assistants, nurse practitioners or other licensed health care professionals (the Practitioner), to provide me with telemedicine health care services (the "Services") as deemed necessary by the treating Practitioner. I acknowledge and consent to receive the Services by the Practitioner via telemedicine. I understand that the telemedicine visit will involve communicating with the Practitioner through live audiovisual communication technology and the disclosure of certain medical information by electronic transmission. I acknowledge that I have been given the opportunity to request an in-person assessment or other available alternative prior to the telemedicine visit and am voluntarily participating in the telemedicine visit.  I understand that I have the right to withhold or withdraw my consent to the use of telemedicine in the course of my care at any time, without affecting my right to future care or treatment, and that the Practitioner or I may terminate the telemedicine visit at any time. I understand that I have the right to inspect all  information obtained and/or recorded in the course of the telemedicine visit and may receive copies of available information for a reasonable fee.  I understand that some of the potential risks of receiving the Services via telemedicine include:  Marland Kitchen Delay or interruption in medical evaluation due to technological equipment failure or disruption; . Information transmitted may not be sufficient (e.g. poor resolution of images) to allow for appropriate medical decision making by the Practitioner; and/or  . In rare instances, security protocols could fail, causing a breach of personal health information.  Furthermore, I acknowledge that it is my responsibility to provide information about my medical history, conditions and care that is complete and accurate to the best of my ability. I acknowledge that Practitioner's advice, recommendations, and/or decision may be based on factors not within their control, such as incomplete or inaccurate data provided by me or distortions of diagnostic images or specimens that may result from electronic transmissions. I understand that the practice of medicine is not an exact science and that Practitioner makes no warranties or guarantees regarding treatment outcomes. I acknowledge that I will receive a copy of this consent concurrently  upon execution via email to the email address I last provided but may also request a printed copy by calling the office of [place] [facility].    I understand that my insurance will be billed for this visit.   I have read or had this consent read to me. . I understand the contents of this consent, which adequately explains the benefits and risks of the Services being provided via telemedicine.  . I have been provided ample opportunity to ask questions regarding this consent and the Services and have had my questions answered to my satisfaction. . I give my informed consent for the services to be provided through the use of telemedicine in my  medical care  By participating in this telemedicine visit I agree to the above.

## 2018-11-25 NOTE — Telephone Encounter (Signed)
Contacted to set up appt by telephone.  Set up one for this Thursday 16 at 4:00 pm.    [place] EMT-Paramedic [phone]

## 2018-11-27 ENCOUNTER — Telehealth (HOSPITAL_COMMUNITY): Payer: Self-pay

## 2018-11-27 NOTE — Telephone Encounter (Signed)
Had a phone appt with Bonnie Ochoa today and her wife.  States she is doing good, denies any complications, chest pain, shortness of breath, headaches or dizziness.  She has all her medications, verified her medications.  She is staying at home mostly due to this virus.  She is aware of appts coming up.  They are watching her sugar and sodium levels.  Her wife and sister manages her medications for her.  She is not sleeping all day now, she is getting up and moving around the inside the house.  Advised them on pretty days to walk around the neighborhood, get some fresh air.  They state her weight has fluctuated up and down but no more than 2 pounds.  Will continue to visit for heart failure and medication management.  [employer] [phone]

## 2018-12-01 ENCOUNTER — Other Ambulatory Visit: Payer: Self-pay

## 2018-12-01 ENCOUNTER — Telehealth (INDEPENDENT_AMBULATORY_CARE_PROVIDER_SITE_OTHER): Payer: Medicare Other | Admitting: Cardiovascular Disease

## 2018-12-01 ENCOUNTER — Ambulatory Visit: Payer: Medicare Other

## 2018-12-01 DIAGNOSIS — I10 Essential (primary) hypertension: Secondary | ICD-10-CM

## 2018-12-01 DIAGNOSIS — I5022 Chronic systolic (congestive) heart failure: Secondary | ICD-10-CM | POA: Diagnosis not present

## 2018-12-01 DIAGNOSIS — E119 Type 2 diabetes mellitus without complications: Secondary | ICD-10-CM

## 2018-12-01 DIAGNOSIS — Z794 Long term (current) use of insulin: Secondary | ICD-10-CM

## 2018-12-01 DIAGNOSIS — I428 Other cardiomyopathies: Secondary | ICD-10-CM

## 2018-12-01 DIAGNOSIS — N182 Chronic kidney disease, stage 2 (mild): Secondary | ICD-10-CM | POA: Diagnosis not present

## 2018-12-01 DIAGNOSIS — I110 Hypertensive heart disease with heart failure: Secondary | ICD-10-CM | POA: Diagnosis not present

## 2018-12-01 NOTE — Progress Notes (Signed)
Virtual Visit via Video Note   This visit type was conducted due to national recommendations for restrictions regarding the COVID-19 Pandemic (e.g. social distancing) in an effort to limit this patient's exposure and mitigate transmission in our community.  Due to her co-morbid illnesses, this patient is at least at moderate risk for complications without adequate follow up.  This format is felt to be most appropriate for this patient at this time.  All issues noted in this document were discussed and addressed.  A limited physical exam was performed with this format.  Please refer to the patient's chart for her consent to telehealth for [facility].    I connected with  Bonnie Ochoa on 12/01/18 by a video enabled telemedicine application and verified that I am speaking with the correct person using two identifiers. I discussed the limitations of evaluation and management by telemedicine. The patient expressed understanding and agreed to proceed.   Evaluation Performed:  Follow-up visit  Date:  12/01/2018   ID:  Bonnie Ochoa, DOB [birth year], MRN [id number]  Patient Location:  [address] [place] [address]   Provider location:   [facility], [place] office  PCP:  Valerie Roys, DO  Cardiologist:  [facility]   Chief Complaint:  Little SOB   History of Present Illness:    Bonnie Ochoa is a 53 y.o. female who presents via audio/video conferencing for a telehealth visit today.   The patient does not symptoms concerning for COVID-19 infection (fever, chills, cough, or new SHORTNESS OF BREATH).   Patient has a past medical history of Medical and appt noncompliance, Substance abuse-chronic /marijuana bipolar,  anxiety diabetes,  hypertension,  asthma,  Obstructive sleep apnea, restless leg syndrome Nonischemic cardiomyopathy, history of ICD,  ejection fraction 25%, 05/2018 Normal coronary artery anemia Previous alcohol problem  (patient denies any coronary disease on prior cardiac catheterization)  ascites, cirrhosis, History of paracentesis Previously on pain medication at home, oxycodone She presents today for follow-up of her chronic systolic CHF  Recent hospitalization October 25, 2018 for renal failure CR 2.15 Hospital records reviewed with the patient in detail Felt to be secondary to dehydration and improved with IV fluids She was continued on Lasix at discharge  She feels that she has made a good recovery Doing some walking No leg edema, no abd swelling Continues to take Lasix daily  Blood pressure was low in the setting of dehydration,  Does not feel orthostatic, dizzy  Denies any chest discomfort, lower extremity edema No abdominal bloating  Used to drink alcohol, no longer drinks  Other past medical history reviewed Previous hospitalization November 2018 for hyperglycemia nonketotic coma She had acute renal failure and hyponatremia  Hospital admission 09/10/16 numerous sx on arrival to ER chest pain, shortness of breath, abdominal pain, dysuria, urinary frequency, syncope 2 today with lightheadedness, nausea and vomiting, and diarrhea , sob TBili 2.4, had paracentesis, diuresis D/c on lasix 60 BID  Hospital admission 09/17/16: chest pain, ABD pain fevers, chills, chest pain, shortness of breath, vomiting and diarrhea. "ran out of meds" HTN, acute on chronic systolic CHF  Seen in the emergency room 05/19/2016 for abdominal pain, chest pain BNP in the hospital 3700 acute on chronic CHF, Had 20 L diuresis   emergency room in December 2017 for nausea, vomiting, URI symptoms  CT scan consistent with moderate abdominal ascites, fatty liver Aortic atherosclerosis   Prior CV studies:   The following studies were reviewed today:  Past Medical History:  Diagnosis Date  . Abdominal pain    a. 05/2018 HIDA scan wnl.  . ADHD   . Anemia   . Arthritis   . Asthma   . Bipolar 1  disorder ([place])   . Chest pain    a. Hx of cath in [place] - reportedly nl; b. 04/2018 MV: EF 22%, fixed dist ant septal, apical, and inferoapical defects - ? scar vs. attenuation. No ischemia.  . CHF (congestive heart failure) ([place])   . Cirrhosis of liver ([place])   . Coronary artery disease   . Depression   . Diabetes mellitus without complication ([place])   . Diverticulitis   . Dysrhythmia   . Heart murmur   . HFrEF (heart failure with reduced ejection fraction) ([place])    a. 06/2017 Echo: EF 20-25%, diff HK. Mildly dil LA; b. 05/2018 Echo: EF 25-30%, diff HK, Gr2 DD. Triv AI. Mod MR. Mildly reduced RV fxn. Mod-Sev TR. PASP 45-42mHg.  .Marland KitchenHypertension   . Hypothyroidism   . IBS (irritable bowel syndrome)   . Insomnia   . Migraines   . NICM (nonischemic cardiomyopathy) ([place]    a. EF prev 25%; b. 11/2014 s/p SJM Fortify Assura, single lead AICD (ser# [id number]; c. 06/2017 Echo: EF 20-25%; d. 05/2018 Echo: EF 25-30%, diff HK, Gr2 DD; e. 05/2018 Cath: Nl cors. LVEDP 216mg, PCWP 3241m. PA 65/40 (52). CO/CI 3.04/1.56.  . OSA (obstructive sleep apnea)   . PTSD (post-traumatic stress disorder)   . Restless leg syndrome   . Sleep apnea   . Vertigo    Past Surgical History:  Procedure Laterality Date  . ABDOMINAL HYSTERECTOMY    . CARDIAC CATHETERIZATION    . CORONARY ARTERY BYPASS GRAFT    . debribalator  2016  . INSERT / REPLACE / REMOVE PACEMAKER    . INSERTION OF ICD    . RIGHT/LEFT HEART CATH AND CORONARY ANGIOGRAPHY N/A 06/09/2018   Procedure: RIGHT/LEFT HEART CATH AND CORONARY ANGIOGRAPHY;  Surgeon: AriWellington HampshireD;  [facility];  Service: Cardiovascular;  Laterality: N/A;  . TONSILLECTOMY    . TONSILLECTOMY    . TONSILLECTOMY    . TUBAL LIGATION  1980  . TUBAL LIGATION       No outpatient medications have been marked as taking for the 12/01/18 encounter (Appointment) with GolMinna MerrittsD.     Allergies:   Levothyroxine   Social History   Tobacco Use   . Smoking status: Former Smoker    Types: Cigarettes  . Smokeless tobacco: Never Used  . Tobacco comment: quit about 20 years ago   Substance Use Topics  . Alcohol use: Not Currently  . Drug use: Not Currently    Frequency: 1.0 times per week    Types: Marijuana     Current Outpatient Medications on File Prior to Visit  Medication Sig Dispense Refill  . atorvastatin (LIPITOR) 80 MG tablet Take 1 tablet (80 mg total) by mouth daily at 6 PM. 90 tablet 1  . blood glucose meter kit and supplies KIT Dispense based on patient and insurance preference. Use up to four times daily as directed. (FOR ICD-9 250.00, 250.01). 1 each 0  . carvedilol (COREG) 12.5 MG tablet Take 1 tablet (12.5 mg total) by mouth 2 (two) times daily. 180 tablet 3  . cyclobenzaprine (FLEXERIL) 5 MG tablet Take 5 mg by mouth 3 (three) times daily as needed for muscle spasms.    . dMarland Kitchencyclomine (  BENTYL) 20 MG tablet TAKE 1 TABLET (20 MG TOTAL) BY MOUTH 3 (THREE) TIMES DAILY AS NEEDED FOR SPASMS. 270 tablet 0  . digoxin (LANOXIN) 0.125 MG tablet Take 1 tablet (0.125 mg total) by mouth daily. 90 tablet 3  . Dulaglutide (TRULICITY) 5.70 VX/7.9TJ SOPN Inject 0.75 mg into the skin once a week. 4 pen 3  . empagliflozin (JARDIANCE) 25 MG TABS tablet Take 25 mg by mouth daily. 90 tablet 1  . famotidine (PEPCID) 40 MG tablet TAKE 1 TABLET BY MOUTH EVERY DAY IN THE EVENING 90 tablet 1  . furosemide (LASIX) 40 MG tablet Take 1 tablet (40 mg total) by mouth daily. 30 tablet 6  . Insulin Glargine (LANTUS SOLOSTAR) 100 UNIT/ML Solostar Pen Inject 20 Units into the skin daily at 10 pm. 5 pen 12  . Insulin Pen Needle 32G X 6 MM MISC 1 each by Does not apply route daily. 100 each 12  . lamoTRIgine (LAMICTAL) 25 MG tablet Take 1 tablet (25 mg total) by mouth 2 (two) times daily. 180 tablet 1  . metoCLOPramide (REGLAN) 10 MG tablet Take 1 tablet (10 mg total) by mouth 3 (three) times daily with meals. 90 tablet 1  . nitroGLYCERIN (NITROSTAT) 0.4  MG SL tablet Place 1 tablet (0.4 mg total) under the tongue every 5 (five) minutes as needed for chest pain. (Patient taking differently: Place 0.4 mg under the tongue every 5 (five) minutes as needed for chest pain. Chest pain.) 30 tablet 0  . pantoprazole (PROTONIX) 40 MG tablet TAKE 1 TABLET BY MOUTH EVERY DAY 90 tablet 3  . PARoxetine (PAXIL) 30 MG tablet Take 1 tablet (30 mg total) by mouth daily. 90 tablet 1  . potassium chloride SA (K-DUR,KLOR-CON) 20 MEQ tablet Take 2 tablets (40 mEq total) by mouth 2 (two) times daily. 360 tablet 1  . QUEtiapine (SEROQUEL) 300 MG tablet Take 1 tablet (300 mg total) by mouth at bedtime. 90 tablet 1  . sacubitril-valsartan (ENTRESTO) 49-51 MG Take 1 tablet by mouth 2 (two) times daily. 180 tablet 1  . sucralfate (CARAFATE) 1 g tablet TAKE 1 TABLET (1 G TOTAL) BY MOUTH 4 (FOUR) TIMES DAILY. 360 tablet 3   No current facility-administered medications on file prior to visit.      Family Hx: The patient's family history includes Alzheimer's disease in her maternal grandfather; Asthma in her sister and sister; Cancer in her sister; Diabetes in her sister and sister; Heart disease in her mother and sister; Hyperlipidemia in her brother and mother; Hypertension in her mother, sister, and sister.  ROS:   Please see the history of present illness.    Review of Systems  Constitutional: Negative.   Respiratory: Negative.   Cardiovascular: Negative.   Gastrointestinal: Negative.   Musculoskeletal: Negative.   Neurological: Negative.   Psychiatric/Behavioral: Negative.   All other systems reviewed and are negative.    Labs/Other Tests and Data Reviewed:    Recent Labs: 06/10/2018: Magnesium 2.1 10/24/2018: B Natriuretic Peptide 49.0; Hemoglobin 9.6; Platelets 202; TSH 1.651 11/03/2018: ALT 10; BUN 23; Creatinine, Ser 1.35; Potassium 3.5; Sodium 141   Recent Lipid Panel Lab Results  Component Value Date/Time   CHOL 303 (H) 11/03/2018 10:31 AM   TRIG  1,246 (HH) 11/03/2018 10:31 AM   HDL 24 (L) 11/03/2018 10:31 AM   CHOLHDL 5.4 05/09/2018 05:00 AM   LDLCALC Comment 11/03/2018 10:31 AM    Wt Readings from Last 3 Encounters:  11/03/18 201 lb (91.2 kg)  10/29/18 196 lb 9.6 oz (89.2 kg)  10/24/18 191 lb (86.6 kg)     Exam:    Vital Signs: Vital signs may also be detailed in the HPI There were no vitals taken for this visit.  Wt Readings from Last 3 Encounters:  11/03/18 201 lb (91.2 kg)  10/29/18 196 lb 9.6 oz (89.2 kg)  10/24/18 191 lb (86.6 kg)   Temp Readings from Last 3 Encounters:  11/03/18 98.6 F (37 C) (Oral)  10/25/18 98.2 F (36.8 C) (Oral)  09/04/18 (!) 97.5 F (36.4 C) (Oral)   BP Readings from Last 3 Encounters:  11/03/18 96/63  10/29/18 92/60  10/25/18 95/61   Pulse Readings from Last 3 Encounters:  11/03/18 73  10/29/18 81  10/25/18 74    100/60 Pulse 70 resp 16  Well nourished, well developed female in no acute distress. Constitutional:  oriented to person, place, and time. No distress.     ASSESSMENT & PLAN:    Chronic systolic heart failure (HCC)  Reports feeling euvolemic, No abdominal swelling, no leg swelling Goal weight likely between 196 and 200  NICM (nonischemic cardiomyopathy) (HCC) Pressure low, asymptomatic, no orthostasis symptoms No medication changes made  CKD (chronic kidney disease), stage II Exacerbated in the setting of dehydration  Followed by nephrology  type 2 diabetes mellitus without complication, with long-term current use of insulin ([facility]) We have encouraged continued exercise, careful diet management in an effort to lose weight.  Essential hypertension Blood pressure low but stable Continue current medications   COVID-19 Education: The signs and symptoms of COVID-19 were discussed with the patient and how to seek care for testing (follow up with PCP or arrange E-visit).  The importance of social distancing was discussed today.  Patient Risk:   After  full review of this patients clinical status, I feel that they are at least moderate risk at this time.  Time:   Today, I have spent 25 minutes with the patient with telehealth technology discussing the cardiac and medical problems/diagnoses detailed above   10 min spent reviewing the chart prior to patient visit today   Medication Adjustments/Labs and Tests Ordered: Current medicines are reviewed at length with the patient today.  Concerns regarding medicines are outlined above.   Tests Ordered: No tests ordered   Medication Changes: No changes made   Disposition: Follow-up in 6 months   Signed, Ida Rogue, MD  12/01/2018 4:16 PM    [facility] [address] [place] [address], [place], [place] [address]

## 2018-12-01 NOTE — Patient Instructions (Addendum)
Medication Instructions:  No changes  If you need a refill on your cardiac medications before your next appointment, please call your pharmacy.    Lab work: No new labs needed   If you have labs (blood work) drawn today and your tests are completely normal, you will receive your results only by: Marland Kitchen MyChart Message (if you have MyChart) OR . A paper copy in the mail If you have any lab test that is abnormal or we need to change your treatment, we will call you to review the results.   Testing/Procedures: No new testing needed   Follow-Up: At [facility], you and your health needs are our priority.  As part of our continuing mission to provide you with exceptional heart care, we have created designated Provider Care Teams.  These Care Teams include your primary Cardiologist (physician) and Advanced Practice Providers (APPs -  Physician Assistants and Nurse Practitioners) who all work together to provide you with the care you need, when you need it.  . You will need a follow up appointment in 6 months .   Please call our office 2 months in advance to schedule this appointment.  (call the office in early August to schedule)   . Providers on your designated Care Team:   . Nicolasa Ducking, NP . Eula Listen, PA-C . Marisue Ivan, PA-C  Any Other Special Instructions Will Be Listed Below (If Applicable).  For educational health videos Log in to : [email or web address] Or : [email or web address], password : [email or web address]

## 2018-12-03 ENCOUNTER — Telehealth: Payer: Self-pay | Admitting: Family Medicine

## 2018-12-03 NOTE — Telephone Encounter (Signed)
Called pt to set up virtual appt for tomorrow no answer left voicemail °

## 2018-12-04 ENCOUNTER — Other Ambulatory Visit: Payer: Self-pay

## 2018-12-04 ENCOUNTER — Ambulatory Visit (INDEPENDENT_AMBULATORY_CARE_PROVIDER_SITE_OTHER): Payer: Medicare Other | Admitting: Family Medicine

## 2018-12-04 ENCOUNTER — Encounter: Payer: Self-pay | Admitting: Family Medicine

## 2018-12-04 VITALS — BP 116/82

## 2018-12-04 DIAGNOSIS — Z794 Long term (current) use of insulin: Secondary | ICD-10-CM

## 2018-12-04 DIAGNOSIS — E1122 Type 2 diabetes mellitus with diabetic chronic kidney disease: Secondary | ICD-10-CM | POA: Diagnosis not present

## 2018-12-04 DIAGNOSIS — N183 Chronic kidney disease, stage 3 unspecified: Secondary | ICD-10-CM

## 2018-12-04 DIAGNOSIS — I200 Unstable angina: Secondary | ICD-10-CM

## 2018-12-04 NOTE — Progress Notes (Signed)
BP 116/82 Comment: pt reported- virtual visit   Subjective:    Patient ID: Bonnie Ochoa, female    DOB: Dec 13, 1965, 53 y.o.   MRN: [id number]  HPI: Bonnie Ochoa is a 53 y.o. female  Chief Complaint  Patient presents with  . Diabetes    4 week f/up, no issues with trulicity   DIABETES- not having any issues with giving herself the shot Hypoglycemic episodes:no Polydipsia/polyuria: yes Visual disturbance: no Chest pain: no Paresthesias: no Glucose Monitoring: yes  Accucheck frequency: Daily  Fasting glucose: 135 yesterday in the AM Taking Insulin?: no Blood Pressure Monitoring: not checking Retinal Examination: Not up to Date Foot Exam: Not up to Date Diabetic Education: Completed Pneumovax: Up to Date Influenza: Up to Date Aspirin: no  Relevant past medical, surgical, family and social history reviewed and updated as indicated. Interim medical history since our last visit reviewed. Allergies and medications reviewed and updated.  Review of Systems  Constitutional: Negative.   Respiratory: Negative.   Cardiovascular: Negative.   Musculoskeletal: Negative.   Neurological: Negative.   Psychiatric/Behavioral: Negative.     Per HPI unless specifically indicated above     Objective:    BP 116/82 Comment: pt reported- virtual visit  Wt Readings from Last 3 Encounters:  11/03/18 201 lb (91.2 kg)  10/29/18 196 lb 9.6 oz (89.2 kg)  10/24/18 191 lb (86.6 kg)    Physical Exam Vitals signs and nursing note reviewed.  Pulmonary:     Effort: Pulmonary effort is normal. No respiratory distress.     Comments: Speaking in full sentences Neurological:     Mental Status: She is alert.  Psychiatric:        Mood and Affect: Mood normal.        Behavior: Behavior normal.        Thought Content: Thought content normal.        Judgment: Judgment normal.     Results for orders placed or performed in visit on 11/03/18  Bayer DCA Hb A1c Waived  Result Value Ref Range   HB A1C (BAYER DCA - WAIVED) 8.3 (H) <7.0 %  Comprehensive metabolic panel  Result Value Ref Range   Glucose 280 (H) 65 - 99 mg/dL   BUN 23 6 - 24 mg/dL   Creatinine, Ser 3.78 (H) 0.57 - 1.00 mg/dL   GFR calc non Af Amer 45 (L) >59 mL/min/1.73   GFR calc Af Amer 51 (L) >59 mL/min/1.73   BUN/Creatinine Ratio 17 9 - 23   Sodium 141 134 - 144 mmol/L   Potassium 3.5 3.5 - 5.2 mmol/L   Chloride 100 96 - 106 mmol/L   CO2 22 20 - 29 mmol/L   Calcium 9.1 8.7 - 10.2 mg/dL   Total Protein 7.3 6.0 - 8.5 g/dL   Albumin 4.0 3.8 - 4.9 g/dL   Globulin, Total 3.3 1.5 - 4.5 g/dL   Albumin/Globulin Ratio 1.2 1.2 - 2.2   Bilirubin Total <0.2 0.0 - 1.2 mg/dL   Alkaline Phosphatase 104 39 - 117 IU/L   AST 20 0 - 40 IU/L   ALT 10 0 - 32 IU/L  Lipid Panel w/o Chol/HDL Ratio  Result Value Ref Range   Cholesterol, Total 303 (H) 100 - 199 mg/dL   Triglycerides 5,885 (HH) 0 - 149 mg/dL   HDL 24 (L) >02 mg/dL   VLDL Cholesterol Cal Comment 5 - 40 mg/dL   LDL Calculated Comment 0 - 99 mg/dL      Assessment &  Plan:   Problem List Items Addressed This Visit      Endocrine   DM (diabetes mellitus), type 2 with renal complications (HCC) - Primary    Tolerating her trulicity well. Continue current regimen. Continue to monitor. Call with any concerns. Recheck A1c in 2 months. Will get her back in ASAP for BMP.          Follow up plan: Return in about 2 months (around 02/03/2019) for DM visit.    . This visit was completed via telephone due to the restrictions of the COVID-19 pandemic. All issues as above were discussed and addressed but no physical exam was performed. If it was felt that the patient should be evaluated in the office, they were directed there. The patient verbally consented to this visit. Patient was unable to complete an audio/visual visit due to Lack of equipment. Due to the catastrophic nature of the COVID-19 pandemic, this visit was done through audio contact only. . Location of the  patient: home . Location of the provider: home . Those involved with this call:  . Provider: Olevia Perches, DO . CMA: Wilhemena Durie, CMA . Front Desk/Registration: Adela Ports  . Time spent on call: 15 minutes on the phone discussing health concerns. 23 minutes total spent in review of patient's record and preparation of their chart.

## 2018-12-04 NOTE — Assessment & Plan Note (Signed)
Tolerating her trulicity well. Continue current regimen. Continue to monitor. Call with any concerns. Recheck A1c in 2 months. Will get her back in ASAP for BMP.

## 2018-12-05 ENCOUNTER — Telehealth: Payer: Self-pay

## 2018-12-05 NOTE — Telephone Encounter (Signed)
Left message for patient to remind of missed remote transmission.  

## 2018-12-08 NOTE — Progress Notes (Signed)
No ICM remote transmission received for 12/01/2018 and next ICM transmission scheduled for 12/17/2018.

## 2018-12-16 ENCOUNTER — Ambulatory Visit (INDEPENDENT_AMBULATORY_CARE_PROVIDER_SITE_OTHER): Payer: Medicare Other | Admitting: Psychiatry

## 2018-12-16 ENCOUNTER — Encounter: Payer: Self-pay | Admitting: Psychiatry

## 2018-12-16 ENCOUNTER — Other Ambulatory Visit: Payer: Self-pay

## 2018-12-16 DIAGNOSIS — F410 Panic disorder [episodic paroxysmal anxiety] without agoraphobia: Secondary | ICD-10-CM | POA: Diagnosis not present

## 2018-12-16 DIAGNOSIS — I200 Unstable angina: Secondary | ICD-10-CM

## 2018-12-16 DIAGNOSIS — F3175 Bipolar disorder, in partial remission, most recent episode depressed: Secondary | ICD-10-CM

## 2018-12-16 DIAGNOSIS — F4312 Post-traumatic stress disorder, chronic: Secondary | ICD-10-CM | POA: Diagnosis not present

## 2018-12-16 DIAGNOSIS — F1221 Cannabis dependence, in remission: Secondary | ICD-10-CM | POA: Diagnosis not present

## 2018-12-16 MED ORDER — LAMOTRIGINE 25 MG PO TABS: 75.0000 mg | ORAL_TABLET | ORAL | 1 refills | Status: DC

## 2018-12-16 NOTE — Progress Notes (Signed)
Virtual Visit via Telephone Note  I connected with Bonnie Ochoa on 12/16/18 at 10:45 AM EDT by telephone and verified that I am speaking with the correct person using two identifiers.   I discussed the limitations, risks, security and privacy concerns of performing an evaluation and management service by telephone and the availability of in person appointments. I also discussed with the patient that there may be a patient responsible charge related to this service. The patient expressed understanding and agreed to proceed.    I discussed the assessment and treatment plan with the patient. The patient was provided an opportunity to ask questions and all were answered. The patient agreed with the plan and demonstrated an understanding of the instructions.   The patient was advised to call back or seek an in-person evaluation if the symptoms worsen or if the condition fails to improve as anticipated.   [place] MD OP Progress Note  12/16/2018 11:38 AM Bonnie Ochoa  MRN:  [id number]  Chief Complaint:  Chief Complaint    Follow-up     HPI: Bonnie Ochoa is a 53 year old African-American female, engaged, lives in [place], has a history of bipolar disorder, panic disorder, PTSD, ADD, agoraphobia, cannabis use disorder, multiple medical problems including osteoarthritis, chronic renal failure, congestive heart failure, cirrhosis of liver, diabetes, hypertension, OSA on CPAP, RLS was evaluated by telephone today.  Patient was offered video consult however declined.  Patient today reports she is currently feeling depressed.  She reports she feels sad and struggles with lack of motivation.  She reports it is due to the current COVID-19 outbreak, being trapped inside the home as well as Mother's Day coming up.  Her mother passed away in 10-26-2012.  With Mother's Day as well as her birthday coming up she has been missing her mother a lot.  Patient reports she continues to be compliant with her medications.  She  reports sleep is good.  She denies suicidality, homicidality or perceptual disturbances.  I have obtained collateral information from her significant other-Bonnie Ochoa-' Per Burna Mortimer patient has been very depressed recently.  She struggles with the loss of her mother a lot and she has been trying to support her as best as she can.  However that has not worked.'  Discussed readjusting her medications as well as referring her back to her therapist. Visit Diagnosis:    ICD-10-CM   1. Bipolar disorder, in partial remission, most recent episode depressed (HCC) F31.75 lamoTRIgine (LAMICTAL) 25 MG tablet  2. Chronic post-traumatic stress disorder (PTSD) F43.12 lamoTRIgine (LAMICTAL) 25 MG tablet  3. Panic disorder F41.0   4. Cannabis use disorder, moderate, in early remission ([place]) F12.21     Past Psychiatric History: Reviewed past psychiatric history from my progress note on 10/16/2017.  Past Medical History:  Past Medical History:  Diagnosis Date  . Abdominal pain    a. 05/2018 HIDA scan wnl.  . ADHD   . Anemia   . Arthritis   . Asthma   . Bipolar 1 disorder ([place])   . Chest pain    a. Hx of cath in [place] - reportedly nl; b. 04/2018 MV: EF 22%, fixed dist ant septal, apical, and inferoapical defects - ? scar vs. attenuation. No ischemia.  . CHF (congestive heart failure) ([place])   . Cirrhosis of liver ([place])   . Coronary artery disease   . Depression   . Diabetes mellitus without complication ([place])   . Diverticulitis   . Dysrhythmia   . Heart murmur   .  HFrEF (heart failure with reduced ejection fraction) ([place])    a. 06/2017 Echo: EF 20-25%, diff HK. Mildly dil LA; b. 05/2018 Echo: EF 25-30%, diff HK, Gr2 DD. Triv AI. Mod MR. Mildly reduced RV fxn. Mod-Sev TR. PASP 45-18mHg.  .Marland KitchenHypertension   . Hypothyroidism   . IBS (irritable bowel syndrome)   . Insomnia   . Migraines   . NICM (nonischemic cardiomyopathy) ([facility]    a. EF prev 25%; b. 11/2014 s/p SJM Fortify [place], single lead AICD (ser#  [id number]; c. 06/2017 Echo: EF 20-25%; d. 05/2018 Echo: EF 25-30%, diff HK, Gr2 DD; e. 05/2018 Cath: Nl cors. LVEDP 252mg, PCWP 3277m. PA 65/40 (52). CO/CI 3.04/1.56.  . OSA (obstructive sleep apnea)   . PTSD (post-traumatic stress disorder)   . Restless leg syndrome   . Sleep apnea   . Vertigo     Past Surgical History:  Procedure Laterality Date  . ABDOMINAL HYSTERECTOMY    . CARDIAC CATHETERIZATION    . CORONARY ARTERY BYPASS GRAFT    . debribalator  2016  . INSERT / REPLACE / REMOVE PACEMAKER    . INSERTION OF ICD    . RIGHT/LEFT HEART CATH AND CORONARY ANGIOGRAPHY N/A 06/09/2018   Procedure: RIGHT/LEFT HEART CATH AND CORONARY ANGIOGRAPHY;  Surgeon: AriWellington HampshireD;  [facility];  Service: Cardiovascular;  Laterality: N/A;  . TONSILLECTOMY    . TONSILLECTOMY    . TONSILLECTOMY    . TUBAL LIGATION  1980  . TUBAL LIGATION      Family Psychiatric History: Have reviewed family psychiatric history from my progress note on 10/16/2017.  Family History:  Family History  Problem Relation Age of Onset  . Hypertension Mother   . Hyperlipidemia Mother   . Heart disease Mother   . Hypertension Sister   . Asthma Sister   . Heart disease Sister   . Diabetes Sister   . Cancer Sister   . Alzheimer's disease Maternal Grandfather   . Hyperlipidemia Brother   . Asthma Sister   . Hypertension Sister   . Diabetes Sister     Social History: Have reviewed social history from my progress note on 10/16/2017. Social History   Socioeconomic History  . Marital status: Married    Spouse name: Not on file  . Number of children: 2  . Years of education: Not on file  . Highest education level: High school graduate  Occupational History    Comment: disabled  Social Needs  . Financial resource strain: Not hard at all  . Food insecurity:    Worry: Sometimes true    Inability: Sometimes true  . Transportation needs:    Medical: Yes    Non-medical: Yes  Tobacco Use   . Smoking status: Former Smoker    Types: Cigarettes  . Smokeless tobacco: Never Used  . Tobacco comment: quit about 20 years ago   Substance and Sexual Activity  . Alcohol use: Not Currently  . Drug use: Not Currently    Frequency: 1.0 times per week    Types: Marijuana  . Sexual activity: Yes    Birth control/protection: Surgical    Comment: one partner  Lifestyle  . Physical activity:    Days per week: 0 days    Minutes per session: 0 min  . Stress: Very much  Relationships  . Social connections:    Talks on phone: Never    Gets together: Twice a week    Attends religious service: Never  Active member of club or organization: No    Attends meetings of clubs or organizations: Never    Relationship status: Married  Other Topics Concern  . Not on file  Social History Narrative   Volunteers occasionally     Allergies:  Allergies  Allergen Reactions  . Levothyroxine Rash    Metabolic Disorder Labs: Lab Results  Component Value Date   HGBA1C 8.3 (H) 11/03/2018   MPG 185.77 05/08/2018   MPG >398 07/03/2017   No results found for: PROLACTIN Lab Results  Component Value Date   CHOL 303 (H) 11/03/2018   TRIG 1,246 (HH) 11/03/2018   HDL 24 (L) 11/03/2018   CHOLHDL 5.4 05/09/2018   VLDL 53 (H) 05/09/2018   [place] Comment 11/03/2018   LDLCALC 84 05/09/2018   Lab Results  Component Value Date   TSH 1.651 10/24/2018   TSH 1.560 02/19/2018    Therapeutic Level Labs: No results found for: LITHIUM No results found for: VALPROATE No components found for:  CBMZ  Current Medications: Current Outpatient Medications  Medication Sig Dispense Refill  . atorvastatin (LIPITOR) 80 MG tablet Take 1 tablet (80 mg total) by mouth daily at 6 PM. 90 tablet 1  . blood glucose meter kit and supplies KIT Dispense based on patient and insurance preference. Use up to four times daily as directed. (FOR ICD-9 250.00, 250.01). 1 each 0  . carvedilol (COREG) 12.5 MG tablet Take 1  tablet (12.5 mg total) by mouth 2 (two) times daily. 180 tablet 3  . cyclobenzaprine (FLEXERIL) 5 MG tablet Take 5 mg by mouth 3 (three) times daily as needed for muscle spasms.    Marland Kitchen dicyclomine (BENTYL) 20 MG tablet TAKE 1 TABLET (20 MG TOTAL) BY MOUTH 3 (THREE) TIMES DAILY AS NEEDED FOR SPASMS. 270 tablet 0  . digoxin (LANOXIN) 0.125 MG tablet Take 1 tablet (0.125 mg total) by mouth daily. 90 tablet 3  . Dulaglutide (TRULICITY) 7.49 SW/9.6PR SOPN Inject 0.75 mg into the skin once a week. 4 pen 3  . empagliflozin (JARDIANCE) 25 MG TABS tablet Take 25 mg by mouth daily. 90 tablet 1  . famotidine (PEPCID) 40 MG tablet TAKE 1 TABLET BY MOUTH EVERY DAY IN THE EVENING 90 tablet 1  . furosemide (LASIX) 40 MG tablet Take 1 tablet (40 mg total) by mouth daily. 30 tablet 6  . Insulin Glargine (LANTUS SOLOSTAR) 100 UNIT/ML Solostar Pen Inject 20 Units into the skin daily at 10 pm. 5 pen 12  . Insulin Pen Needle 32G X 6 MM MISC 1 each by Does not apply route daily. 100 each 12  . lamoTRIgine (LAMICTAL) 25 MG tablet Take 3 tablets (75 mg total) by mouth as directed. Take 25 mg in the AM and 50 mg PM 270 tablet 1  . metoCLOPramide (REGLAN) 10 MG tablet Take 1 tablet (10 mg total) by mouth 3 (three) times daily with meals. 90 tablet 1  . nitroGLYCERIN (NITROSTAT) 0.4 MG SL tablet Place 1 tablet (0.4 mg total) under the tongue every 5 (five) minutes as needed for chest pain. (Patient taking differently: Place 0.4 mg under the tongue every 5 (five) minutes as needed for chest pain. Chest pain.) 30 tablet 0  . pantoprazole (PROTONIX) 40 MG tablet TAKE 1 TABLET BY MOUTH EVERY DAY 90 tablet 3  . PARoxetine (PAXIL) 30 MG tablet Take 1 tablet (30 mg total) by mouth daily. 90 tablet 1  . potassium chloride SA (K-DUR,KLOR-CON) 20 MEQ tablet Take 2 tablets (40  mEq total) by mouth 2 (two) times daily. 360 tablet 1  . QUEtiapine (SEROQUEL) 300 MG tablet Take 1 tablet (300 mg total) by mouth at bedtime. 90 tablet 1  .  sacubitril-valsartan (ENTRESTO) 49-51 MG Take 1 tablet by mouth 2 (two) times daily. 180 tablet 1  . spironolactone (ALDACTONE) 25 MG tablet Take 25 mg by mouth daily.    . sucralfate (CARAFATE) 1 g tablet TAKE 1 TABLET (1 G TOTAL) BY MOUTH 4 (FOUR) TIMES DAILY. 360 tablet 3   No current facility-administered medications for this visit.      Musculoskeletal: Strength & Muscle Tone: UTA Gait & Station: UTA Patient leans: N/A  Psychiatric Specialty Exam: Review of Systems  Psychiatric/Behavioral: Positive for depression. The patient is nervous/anxious.   All other systems reviewed and are negative.   There were no vitals taken for this visit.There is no height or weight on file to calculate BMI.  General Appearance: UTA  Eye Contact:  UTA  Speech:  Clear and Coherent  Volume:  Decreased  Mood:  Anxious and Depressed  Affect:  UTA  Thought Process:  Goal Directed and Descriptions of Associations: Intact  Orientation:  Full (Time, Place, and Person)  Thought Content: Logical   Suicidal Thoughts:  No  Homicidal Thoughts:  No  Memory:  Immediate;   Fair Recent;   Fair Remote;   Fair  Judgement:  Intact  Insight:  Fair  Psychomotor Activity:  UTA  Concentration:  Concentration: Fair and Attention Span: Fair  Recall:  [employer] of Knowledge: Fair  Language: Fair  Akathisia:  No  Handed:  Right  AIMS (if indicated): Denies tremors, rigidity, stiffness  Assets:  Communication Skills Desire for Improvement Housing Social Support  ADL's:  Intact  Cognition: WNL  Sleep:  Fair   Screenings: GAD-7     Office Visit from 12/28/2016 in [facility]  Total GAD-7 Score  21    PHQ2-9     Patient Outreach Telephone from 06/16/2018 in [facility] Visit from 05/08/2018 in [facility] Office Visit from 04/23/2018 in [facility] from 02/19/2018 in [facility] Office Visit from 12/24/2017 in [facility]  PHQ-2 Total Score  0  1  0  2  0  PHQ-9 Total Score  -  -  -  7  -       Assessment and Plan: Zaryiah is a 53 year old African-American female who has a history of bipolar disorder, PTSD, panic disorder, cannabis use disorder, congestive heart failure, liver disease, chronic renal failure, diabetes melitis, OSA, hypertension, RLS was evaluated by telemedicine today.  Patient currently struggles with depressive symptoms.  Discussed readjusting her medications.  Plan as noted below.  Plan Bipolar disorder-unstable Continue Seroquel 300 mg p.o. nightly. Increase lamotrigine to 75 mg p.o. daily in divided dosage. Paxil 30 mg p.o. daily  For PTSD- stable Paxil and Seroquel as prescribed  For panic disorder-stable Paxil as prescribed  Insomnia-stable Seroquel 300 mg p.o. nightly OSA on CPAP.  Cannabis use disorder in early remission Continue to monitor closely.  For alcohol use disorder in remission She quit in 2006, occasional use only now.  Collateral information was obtained from spouse as summarized above.  This with patient to restart psychotherapy sessions with Ms. Peacock.  I have sent a message to Ms. Peacock as well as Surveyor, quantity to schedule an  appointment with Ms. Peacock since patient is struggling.  Follow-up in clinic in 3 weeks or sooner if needed.  I have spent atleast 25 minutes non face to face with patient today. More than 50 % of the time was spent for psychoeducation and supportive psychotherapy and care coordination.  This note was generated in part or whole with voice recognition software. Voice recognition is usually quite accurate but there are transcription errors that can and very often do occur. I apologize for any typographical errors that were not detected and corrected.          [name], MD 12/16/2018, 11:38 AM

## 2018-12-18 ENCOUNTER — Telehealth: Payer: Self-pay

## 2018-12-18 NOTE — Telephone Encounter (Signed)
Left message for patient to remind of missed remote transmission.  

## 2018-12-19 ENCOUNTER — Encounter (HOSPITAL_COMMUNITY): Payer: Self-pay | Admitting: *Deleted

## 2018-12-19 ENCOUNTER — Telehealth: Payer: Self-pay

## 2018-12-19 ENCOUNTER — Ambulatory Visit (HOSPITAL_COMMUNITY)
Admission: RE | Admit: 2018-12-19 | Discharge: 2018-12-19 | Disposition: A | Discharge status: Home / Self Care | Payer: Medicare Other | Source: Ambulatory Visit | Attending: Internal Medicine | Admitting: Internal Medicine

## 2018-12-19 ENCOUNTER — Other Ambulatory Visit: Payer: Self-pay

## 2018-12-19 VITALS — Wt 199.0 lb

## 2018-12-19 DIAGNOSIS — G4733 Obstructive sleep apnea (adult) (pediatric): Secondary | ICD-10-CM | POA: Diagnosis not present

## 2018-12-19 DIAGNOSIS — Z95810 Presence of automatic (implantable) cardiac defibrillator: Secondary | ICD-10-CM

## 2018-12-19 DIAGNOSIS — I5022 Chronic systolic (congestive) heart failure: Secondary | ICD-10-CM | POA: Diagnosis not present

## 2018-12-19 DIAGNOSIS — I428 Other cardiomyopathies: Secondary | ICD-10-CM | POA: Diagnosis not present

## 2018-12-19 NOTE — Addendum Note (Signed)
Encounter addended by: Noralee Space, RN on: 12/19/2018 1:58 PM  Actions taken: Clinical Note Signed

## 2018-12-19 NOTE — Patient Instructions (Signed)
Your physician recommends that you schedule a follow-up appointment in: 3 months with labs

## 2018-12-19 NOTE — Progress Notes (Addendum)
Heart Failure TeleHealth Note  Due to national recommendations of social distancing due to [place] 19, Audio/video telehealth visit is felt to be most appropriate for this patient at this time.  See MyChart message from today for patient consent regarding telehealth for [facility].  Date:  12/19/2018   ID:  Bonnie Ochoa, DOB 1965-11-11, MRN [id number]  Location: Home  Provider location: [place] [facility] Type of Visit: Established patient   PCP:  Valerie Roys, DO  Cardiologist:  Ida Rogue, MD Primary HF: Dr Haroldine Laws  Chief Complaint: Heart Failure   History of Present Illness  :Bonnie Ochoa is a 53 y.o. female with a history of NICM, chronic systolic heart failure,St Jude single chamber ICD,  DMII, HTN, bipolar disorder, OSA, PTSD, depression, cataract right eye, thyroid nodules, former heavy smoker, and cirrhosis. Initially diagnosed with HF in 2008 while living in [place]. In 2017 she relocated to [place] to be closer to her sister.   She has had multiple hospital admissions and ED visits at [facility] over the last 3 months for a/c systolic heart failure.    Admitted to [facility] in June 07, 2018 with chest pain and shortness of breath. Diuresed with IV lasix and later underwent RHC/LHC. She has elevated filling pressures and lowe cardiac index was 1.5. Since discharge entresto was started on 06/20/2018.   She  presents via Engineer, civil (consulting) for a telehealth visit today. Overall feeling fine. Denies SOB/PND/Orthopnea. CPAP is broken. She called to get it fixed.  Able to walk up steps. Appetite ok. No fever or chills. Weight at home 199 pounds.  Taking all medications. Followed by HF Paramedicine. . She is not smoking or drinking alcohol.     she denies symptoms worrisome for COVID 19.   CPX 07/16/2018  Peak VO2: 11.8 (66% predicted peak VO2) VE/VCO2 slope: 37 OUES: 1.12 Peak RER: 1.09 Moderate limitation with moderately elevated VE/VCO2.   Cardiac Test  05/2018 RHC/LHC  Normal coronary arteries. PA 65/40 Mean 52  PCWP 32 PVR 6.6 CO 3.04 CI 1.56.   ECHO 06/08/2018  Left ventricle: The cavity size was severely dilated. Wall thickness was at the upper limits of normal. Systolic function was severely reduced. The estimated ejection fraction was in the range of 25% to 30%. Diffuse hypokinesis. Features are consistent with a pseudonormal left ventricular filling pattern, with concomitant abnormal relaxation and increased filling pressure (grade 2 diastolic dysfunction). Doppler parameters are consistent with high ventricular filling pressure. - Aortic valve: Trileaflet; mildly thickened, mildly calcified leaflets. There was trivial regurgitation. - Mitral valve: There was at least moderate regurgitation. - Left atrium: The atrium was mildly dilated. - Right ventricle: The cavity size was mildly dilated. Wall thickness was normal. Systolic function was mildly reduced. - Right atrium: The atrium was mildly dilated. - Tricuspid valve: There was moderate-severe regurgitation. - Pulmonary arteries: Systolic pressure was moderately increased, in the range of 45 mm Hg to 50 mm Hg. - Inferior vena cava: The vessel was dilated. The respirophasic diameter changes were blunted (<50%), consistent with elevated central venous pressure. - Pericardium, extracardiac: A small pericardial effusion was identified.  Past Medical History:  Diagnosis Date  . Abdominal pain    a. 05/2018 HIDA scan wnl.  . ADHD   . Anemia   . Arthritis   . Asthma   . Bipolar 1 disorder ([place])   . Chest pain    a. Hx of cath in [place] - reportedly nl; b. 04/2018 MV:  EF 22%, fixed dist ant septal, apical, and inferoapical defects - ? scar vs. attenuation. No ischemia.  . CHF (congestive heart failure) ([place])   . Cirrhosis of liver ([place])   . Coronary artery disease   . Depression   . Diabetes mellitus without complication ([place])   .  Diverticulitis   . Dysrhythmia   . Heart murmur   . HFrEF (heart failure with reduced ejection fraction) ([place])    a. 06/2017 Echo: EF 20-25%, diff HK. Mildly dil LA; b. 05/2018 Echo: EF 25-30%, diff HK, Gr2 DD. Triv AI. Mod MR. Mildly reduced RV fxn. Mod-Sev TR. PASP 45-1mHg.  .Marland KitchenHypertension   . Hypothyroidism   . IBS (irritable bowel syndrome)   . Insomnia   . Migraines   . NICM ([place]ischemic cardiomyopathy) ([place]    a. EF prev 25%; b. 11/2014 s/p SJM Fortify Assura, single lead AICD (ser# [id number]; c. 06/2017 Echo: EF 20-25%; d. 05/2018 Echo: EF 25-30%, diff HK, Gr2 DD; e. 05/2018 Cath: Nl cors. LVEDP 280mg, PCWP 3214m. PA 65/40 (52). CO/CI 3.04/1.56.  . OSA (obstructive sleep apnea)   . PTSD (post-traumatic stress disorder)   . Restless leg syndrome   . Sleep apnea   . Vertigo    Past Surgical History:  Procedure Laterality Date  . ABDOMINAL HYSTERECTOMY    . CARDIAC CATHETERIZATION    . CORONARY ARTERY BYPASS GRAFT    . debribalator  2016  . INSERT / REPLACE / REMOVE PACEMAKER    . INSERTION OF ICD    . RIGHT/LEFT HEART CATH AND CORONARY ANGIOGRAPHY N/A 06/09/2018   Procedure: RIGHT/LEFT HEART CATH AND CORONARY ANGIOGRAPHY;  Surgeon: AriWellington HampshireD;  [facility];  Service: Cardiovascular;  Laterality: N/A;  . TONSILLECTOMY    . TONSILLECTOMY    . TONSILLECTOMY    . TUBAL LIGATION  1980  . TUBAL LIGATION       Current Outpatient Medications  Medication Sig Dispense Refill  . atorvastatin (LIPITOR) 80 MG tablet Take 1 tablet (80 mg total) by mouth daily at 6 PM. 90 tablet 1  . blood glucose meter kit and supplies KIT Dispense based on patient and insurance preference. Use up to four times daily as directed. (FOR ICD-9 250.00, 250.01). 1 each 0  . carvedilol (COREG) 12.5 MG tablet Take 1 tablet (12.5 mg total) by mouth 2 (two) times daily. 180 tablet 3  . cyclobenzaprine (FLEXERIL) 5 MG tablet Take 5 mg by mouth 3 (three) times daily as needed for  muscle spasms.    . dMarland Kitchencyclomine (BENTYL) 20 MG tablet TAKE 1 TABLET (20 MG TOTAL) BY MOUTH 3 (THREE) TIMES DAILY AS NEEDED FOR SPASMS. 270 tablet 0  . digoxin (LANOXIN) 0.125 MG tablet Take 1 tablet (0.125 mg total) by mouth daily. 90 tablet 3  . Dulaglutide (TRULICITY) 0.78.12/XN/1.7GYPN Inject 0.75 mg into the skin once a week. 4 pen 3  . empagliflozin (JARDIANCE) 25 MG TABS tablet Take 25 mg by mouth daily. 90 tablet 1  . famotidine (PEPCID) 40 MG tablet TAKE 1 TABLET BY MOUTH EVERY DAY IN THE EVENING 90 tablet 1  . furosemide (LASIX) 40 MG tablet Take 1 tablet (40 mg total) by mouth daily. 30 tablet 6  . Insulin Glargine (LANTUS SOLOSTAR) 100 UNIT/ML Solostar Pen Inject 20 Units into the skin daily at 10 pm. 5 pen 12  . Insulin Pen Needle 32G X 6 MM MISC 1 each by Does not apply route daily. 100  each 12  . lamoTRIgine (LAMICTAL) 25 MG tablet Take 3 tablets (75 mg total) by mouth as directed. Take 25 mg in the AM and 50 mg PM 270 tablet 1  . metoCLOPramide (REGLAN) 10 MG tablet Take 1 tablet (10 mg total) by mouth 3 (three) times daily with meals. 90 tablet 1  . nitroGLYCERIN (NITROSTAT) 0.4 MG SL tablet Place 1 tablet (0.4 mg total) under the tongue every 5 (five) minutes as needed for chest pain. (Patient taking differently: Place 0.4 mg under the tongue every 5 (five) minutes as needed for chest pain. Chest pain.) 30 tablet 0  . pantoprazole (PROTONIX) 40 MG tablet TAKE 1 TABLET BY MOUTH EVERY DAY 90 tablet 3  . PARoxetine (PAXIL) 30 MG tablet Take 1 tablet (30 mg total) by mouth daily. 90 tablet 1  . potassium chloride SA (K-DUR,KLOR-CON) 20 MEQ tablet Take 2 tablets (40 mEq total) by mouth 2 (two) times daily. 360 tablet 1  . QUEtiapine (SEROQUEL) 300 MG tablet Take 1 tablet (300 mg total) by mouth at bedtime. 90 tablet 1  . sacubitril-valsartan (ENTRESTO) 49-51 MG Take 1 tablet by mouth 2 (two) times daily. 180 tablet 1  . spironolactone (ALDACTONE) 25 MG tablet Take 25 mg by mouth daily.     . sucralfate (CARAFATE) 1 g tablet TAKE 1 TABLET (1 G TOTAL) BY MOUTH 4 (FOUR) TIMES DAILY. 360 tablet 3   No current facility-administered medications for this encounter.     Allergies:   Levothyroxine   Social History:  The patient  reports that she has quit smoking. Her smoking use included cigarettes. She has never used smokeless tobacco. She reports previous alcohol use. She reports previous drug use. Frequency: 1.00 time per week. Drug: Marijuana.   Family History:  The patient's family history includes Alzheimer's disease in her maternal grandfather; Asthma in her sister and sister; Cancer in her sister; Diabetes in her sister and sister; Heart disease in her mother and sister; Hyperlipidemia in her brother and mother; Hypertension in her mother, sister, and sister.   ROS:  Please see the history of present illness.   All other systems are personally reviewed and negative.   Exam:  Tele Health Call; Exam is subjective General:  Speaks in full sentences. No resp difficulty. Lungs: Normal respiratory effort with conversation.  Abdomen: Non-distended per patient report Extremities: Pt denies edema. Neuro: Alert & oriented x 3.   Recent Labs: 06/10/2018: Magnesium 2.1 10/24/2018: B Natriuretic Peptide 49.0; Hemoglobin 9.6; Platelets 202; TSH 1.651 11/03/2018: ALT 10; BUN 23; Creatinine, Ser 1.35; Potassium 3.5; Sodium 141  Personally reviewed   Wt Readings from Last 3 Encounters:  12/19/18 90.3 kg (199 lb)  11/03/18 91.2 kg (201 lb)  10/29/18 89.2 kg (196 lb 9.6 oz)      ASSESSMENT AND PLAN: 1. Chronic Systolic/Diastolic  Heart Failure , NICM. St Jude single chamber ICD. 05/2018 RHC/LHC cors normal. Cardiac output 3. Cardiac Index 1.56.  - CPX 07/2018 -slope 37, RER 1.1  VO 2 11.8 --. Repeat CPX in December.  - ECHO 06/08/2018 EF 25-30% Grade II DD. -NYHA II. Volume status stable. Unable to obtain device transmission says she is unable to transmit. I sent a message to the  device clinic to follow up with her.  - Continue spironolactone to 25 mg daily  - Continue carvedilol 12.5 mg twice a day. - Continue digoxin 0.125 mg daily, dig level 0.5 -->08/2018   - Continue entresto 49-51 mg twice a day.  -  Can consider bidil as BP tolerates - Briefly discussed advanced therapies such as possible LVAD 2. HTN  3. CKD Stage II  Recent BMEt reviewed from 11/03/2018 4. DMII -On insulin per PCP.  5. Obesity  Body mass index is 33.02 kg/m. Discussed portion control.  6. OSA CPAP broken she has reached out to her sleep doctor. Needs to resume.     COVID screen The patient does not have any symptoms that suggest any further testing/ screening at this time.  Social distancing reinforced today.  Patient Risk: After full review of this patients clinical status, I feel that they are at moderate risk for cardiac decompensation at this time.  Relevant cardiac medications were reviewed at length with the patient today. The patient does not have concerns regarding their medications at this time.   The following changes were made today:  none Recommended follow-up:  Follow up in 3 months with APP. Plan to check BMET at that time.   Today, I have spent 15  minutes with the patient with telehealth technology discussing the above issues .    Jeanmarie Hubert, NP  12/19/2018 12:27 PM  [place] [address] [facility] [place] [address] [phone] (office) [phone] (fax)

## 2018-12-19 NOTE — Progress Notes (Signed)
AVS sent to pt via mychart. 

## 2018-12-19 NOTE — Telephone Encounter (Signed)
Call to patient and explained I received a message from Tonye Becket, NP today after the televisit saying her Merlin home monitor is not working.  She said there is a piece missing.  Provided tech service number and advised to call to get a new monitor ordered. She said she will call today.

## 2018-12-22 ENCOUNTER — Other Ambulatory Visit: Payer: Self-pay

## 2018-12-22 MED ORDER — FUROSEMIDE 40 MG PO TABS: 40.0000 mg | ORAL_TABLET | Freq: Every day | ORAL | 6 refills | Status: DC

## 2018-12-23 NOTE — Progress Notes (Signed)
No ICM remote transmission received for 12/17/2018 and next ICM transmission scheduled for 01/12/2019.

## 2018-12-30 ENCOUNTER — Telehealth (HOSPITAL_COMMUNITY): Payer: Self-pay

## 2018-12-30 NOTE — Telephone Encounter (Signed)
Today had a telephone visit with Bonnie Ochoa.  She states been doing good.  She is getting up early and driving her wife to work at 6 am.  She is not sleeping all day now.  She been watching what she eats and drinks.  Her sugars has been staying in the 100's now.  She has not got her new cpap yet.  She has not got a new cord for her monitoring device yet, she states her wife has called them.  She denies chest pain, states has some headaches, denies dizziness or shortness of breath.  She states walks on pretty days.  She states has no swelling.  Her weight fluctuates a couple of pounds, stays below 200 lbs. Meds verified and she states she has them all, her wife takes care of her medications.  Will continue to visit for heart failure, medication compliance and diet.   Earmon [place] EMT-Paramedic [phone]

## 2019-01-06 ENCOUNTER — Ambulatory Visit (INDEPENDENT_AMBULATORY_CARE_PROVIDER_SITE_OTHER): Payer: Medicare Other | Admitting: Psychiatry

## 2019-01-06 ENCOUNTER — Encounter: Payer: Self-pay | Admitting: Psychiatry

## 2019-01-06 ENCOUNTER — Other Ambulatory Visit: Payer: Self-pay

## 2019-01-06 DIAGNOSIS — F1221 Cannabis dependence, in remission: Secondary | ICD-10-CM

## 2019-01-06 DIAGNOSIS — F3132 Bipolar disorder, current episode depressed, moderate: Secondary | ICD-10-CM | POA: Diagnosis not present

## 2019-01-06 DIAGNOSIS — F410 Panic disorder [episodic paroxysmal anxiety] without agoraphobia: Secondary | ICD-10-CM | POA: Diagnosis not present

## 2019-01-06 DIAGNOSIS — I200 Unstable angina: Secondary | ICD-10-CM

## 2019-01-06 DIAGNOSIS — F4312 Post-traumatic stress disorder, chronic: Secondary | ICD-10-CM | POA: Diagnosis not present

## 2019-01-06 DIAGNOSIS — F3175 Bipolar disorder, in partial remission, most recent episode depressed: Secondary | ICD-10-CM

## 2019-01-06 DIAGNOSIS — F5105 Insomnia due to other mental disorder: Secondary | ICD-10-CM

## 2019-01-06 MED ORDER — RISPERIDONE 1 MG PO TABS: 1.0000 mg | ORAL_TABLET | Freq: Every day | ORAL | 0 refills | Status: DC

## 2019-01-06 MED ORDER — PAROXETINE HCL 30 MG PO TABS: 30.0000 mg | ORAL_TABLET | Freq: Every day | ORAL | 1 refills | Status: DC

## 2019-01-06 MED ORDER — ESZOPICLONE 1 MG PO TABS: 1.0000 mg | ORAL_TABLET | Freq: Every evening | ORAL | 0 refills | Status: DC | PRN

## 2019-01-06 MED ORDER — LAMOTRIGINE 25 MG PO TABS: 75.0000 mg | ORAL_TABLET | ORAL | 1 refills | Status: DC

## 2019-01-06 NOTE — Progress Notes (Addendum)
Virtual Visit via Video Note  I connected with Bonnie Ochoa on 01/06/19 at  4:45 PM EDT by a video enabled telemedicine application and verified that I am speaking with the correct person using two identifiers.   I discussed the limitations of evaluation and management by telemedicine and the availability of in person appointments. The patient expressed understanding and agreed to proceed.   I discussed the assessment and treatment plan with the patient. The patient was provided an opportunity to ask questions and all were answered. The patient agreed with the plan and demonstrated an understanding of the instructions.   The patient was advised to call back or seek an in-person evaluation if the symptoms worsen or if the condition fails to improve as anticipated.   Camp Sherman MD  OP Progress Note  01/06/2019 12:46 PM Bonnie Ochoa  MRN:  [id number]  Chief Complaint:  Chief Complaint    Follow-up     HPI: Bonnie Ochoa is a 53 year old African-American female, engaged, lives in [place], has a history of bipolar disorder, PTSD, panic attacks, ADD, agoraphobia, cannabis use disorder, osteoarthritis, chronic renal failure, congestive heart failure, cirrhosis of liver, diabetes, hypertension, OSA on CPAP, RLS was evaluated by telemedicine today.  Patient reports she continues to feel depressed.  She reports she has been struggling with lack of motivation, low energy and sadness.  She continues to miss her mother who passed away in Oct 10, 2012.  She reports she is unable to stop thinking about her.  Patient reports sleep is restless.  She has been taking Seroquel which does not seem to be helpful.  Patient reports she never picked up the Lamictal and reports that the pharmacy told her that they never received it.  Discussed with patient about calling clinic when she has problems with medications in the future.  Discussed with her the Lamictal can be sent to the pharmacy again today.  Her Lamictal dosage was  increased to 75 mg last visit however she never took the higher dosage.  Patient reports she has been hearing voices which are asking her to snap and making her more irritable.  She does not think the Seroquel is helpful with the voices.  Discussed starting risperidone.  She agrees with plan.  Patient denies any suicidality.  Collateral information was obtained from her spouse-Bonnie Ochoa who reports that patient continues to struggle with sadness and also sleep problems.  She has been trying to help her and support her as best she can.  Discussed with spouse to reach out to the front desk to schedule an appointment to start therapy sessions again.  Patient denies any other concerns today. Visit Diagnosis:    ICD-10-CM   1. Bipolar disorder, current episode depressed, moderate (HCC) F31.32 lamoTRIgine (LAMICTAL) 25 MG tablet    eszopiclone (LUNESTA) 1 MG TABS tablet    risperiDONE (RISPERDAL) 1 MG tablet  2. Chronic post-traumatic stress disorder (PTSD) F43.12 lamoTRIgine (LAMICTAL) 25 MG tablet    PARoxetine (PAXIL) 30 MG tablet  3. Panic disorder F41.0 PARoxetine (PAXIL) 30 MG tablet  4. Cannabis use disorder, moderate, in early remission ([place]) F12.21   5. Insomnia due to mental condition F51.05     Past Psychiatric History: Reviewed past psychiatric history from my progress note on 10/16/2017.  Past Medical History:  Past Medical History:  Diagnosis Date  . Abdominal pain    a. 05/2018 HIDA scan wnl.  . ADHD   . Anemia   . Arthritis   . Asthma   . Bipolar 1  disorder ([place])   . Chest pain    a. Hx of cath in [place] - reportedly nl; b. 04/2018 MV: EF 22%, fixed dist ant septal, apical, and inferoapical defects - ? scar vs. attenuation. No ischemia.  . CHF (congestive heart failure) ([place])   . Cirrhosis of liver ([place])   . Coronary artery disease   . Depression   . Diabetes mellitus without complication ([place])   . Diverticulitis   . Dysrhythmia   . Heart murmur   . HFrEF (heart failure  with reduced ejection fraction) ([place])    a. 06/2017 Echo: EF 20-25%, diff HK. Mildly dil LA; b. 05/2018 Echo: EF 25-30%, diff HK, Gr2 DD. Triv AI. Mod MR. Mildly reduced RV fxn. Mod-Sev TR. PASP 45-62mHg.  .Marland KitchenHypertension   . Hypothyroidism   . IBS (irritable bowel syndrome)   . Insomnia   . Migraines   . NICM (nonischemic cardiomyopathy) ([place]    a. EF prev 25%; b. 11/2014 s/p SJM Fortify [place], single lead AICD (ser# [id number]; c. 06/2017 Echo: EF 20-25%; d. 05/2018 Echo: EF 25-30%, diff HK, Gr2 DD; e. 05/2018 Cath: Nl cors. LVEDP 280mg, PCWP 3281m. PA 65/40 (52). CO/CI 3.04/1.56.  . OSA (obstructive sleep apnea)   . PTSD (post-traumatic stress disorder)   . Restless leg syndrome   . Sleep apnea   . Vertigo     Past Surgical History:  Procedure Laterality Date  . ABDOMINAL HYSTERECTOMY    . CARDIAC CATHETERIZATION    . CORONARY ARTERY BYPASS GRAFT    . debribalator  2016  . INSERT / REPLACE / REMOVE PACEMAKER    . INSERTION OF ICD    . RIGHT/LEFT HEART CATH AND CORONARY ANGIOGRAPHY N/A 06/09/2018   Procedure: RIGHT/LEFT HEART CATH AND CORONARY ANGIOGRAPHY;  Surgeon: AriWellington HampshireD;  Location: [facility];  Service: Cardiovascular;  Laterality: N/A;  . TONSILLECTOMY    . TONSILLECTOMY    . TONSILLECTOMY    . TUBAL LIGATION  1980  . TUBAL LIGATION      Family Psychiatric History: Reviewed family psychiatric history from my progress note on 10/16/2017.  Family History:  Family History  Problem Relation Age of Onset  . Hypertension Mother   . Hyperlipidemia Mother   . Heart disease Mother   . Hypertension Sister   . Asthma Sister   . Heart disease Sister   . Diabetes Sister   . Cancer Sister   . Alzheimer's disease Maternal Grandfather   . Hyperlipidemia Brother   . Asthma Sister   . Hypertension Sister   . Diabetes Sister     Social History: Reviewed social history from my progress note on 10/16/2017. Social History   Socioeconomic History  .  Marital status: Married    Spouse name: Not on file  . Number of children: 2  . Years of education: Not on file  . Highest education level: High school graduate  Occupational History    Comment: disabled  Social Needs  . Financial resource strain: Not hard at all  . Food insecurity:    Worry: Sometimes true    Inability: Sometimes true  . Transportation needs:    Medical: Yes    Non-medical: Yes  Tobacco Use  . Smoking status: Former Smoker    Types: Cigarettes  . Smokeless tobacco: Never Used  . Tobacco comment: quit about 20 years ago   Substance and Sexual Activity  . Alcohol use: Not Currently  . Drug use: Not Currently  Frequency: 1.0 times per week    Types: Marijuana  . Sexual activity: Yes    Birth control/protection: Surgical    Comment: one partner  Lifestyle  . Physical activity:    Days per week: 0 days    Minutes per session: 0 min  . Stress: Very much  Relationships  . Social connections:    Talks on phone: Never    Gets together: Twice a week    Attends religious service: Never    Active member of club or organization: No    Attends meetings of clubs or organizations: Never    Relationship status: Married  Other Topics Concern  . Not on file  Social History Narrative   Volunteers occasionally     Allergies:  Allergies  Allergen Reactions  . Levothyroxine Rash    Metabolic Disorder Labs: Lab Results  Component Value Date   HGBA1C 8.3 (H) 11/03/2018   MPG 185.77 05/08/2018   MPG >398 07/03/2017   No results found for: PROLACTIN Lab Results  Component Value Date   CHOL 303 (H) 11/03/2018   TRIG 1,246 (HH) 11/03/2018   HDL 24 (L) 11/03/2018   CHOLHDL 5.4 05/09/2018   VLDL 53 (H) 05/09/2018   [place] Comment 11/03/2018   LDLCALC 84 05/09/2018   Lab Results  Component Value Date   TSH 1.651 10/24/2018   TSH 1.560 02/19/2018    Therapeutic Level Labs: No results found for: LITHIUM No results found for: VALPROATE No components  found for:  CBMZ  Current Medications: Current Outpatient Medications  Medication Sig Dispense Refill  . atorvastatin (LIPITOR) 80 MG tablet Take 1 tablet (80 mg total) by mouth daily at 6 PM. 90 tablet 1  . blood glucose meter kit and supplies KIT Dispense based on patient and insurance preference. Use up to four times daily as directed. (FOR ICD-9 250.00, 250.01). 1 each 0  . carvedilol (COREG) 12.5 MG tablet Take 1 tablet (12.5 mg total) by mouth 2 (two) times daily. 180 tablet 3  . cyclobenzaprine (FLEXERIL) 5 MG tablet Take 5 mg by mouth 3 (three) times daily as needed for muscle spasms.    Marland Kitchen dicyclomine (BENTYL) 20 MG tablet TAKE 1 TABLET (20 MG TOTAL) BY MOUTH 3 (THREE) TIMES DAILY AS NEEDED FOR SPASMS. 270 tablet 0  . digoxin (LANOXIN) 0.125 MG tablet Take 1 tablet (0.125 mg total) by mouth daily. 90 tablet 3  . Dulaglutide (TRULICITY) 8.50 YD/7.4JO SOPN Inject 0.75 mg into the skin once a week. 4 pen 3  . empagliflozin (JARDIANCE) 25 MG TABS tablet Take 25 mg by mouth daily. 90 tablet 1  . eszopiclone (LUNESTA) 1 MG TABS tablet Take 1 tablet (1 mg total) by mouth at bedtime as needed for sleep. Take immediately before bedtime 30 tablet 0  . famotidine (PEPCID) 40 MG tablet TAKE 1 TABLET BY MOUTH EVERY DAY IN THE EVENING 90 tablet 1  . furosemide (LASIX) 40 MG tablet Take 1 tablet (40 mg total) by mouth daily. 30 tablet 6  . Insulin Glargine (LANTUS SOLOSTAR) 100 UNIT/ML Solostar Pen Inject 20 Units into the skin daily at 10 pm. 5 pen 12  . Insulin Pen Needle 32G X 6 MM MISC 1 each by Does not apply route daily. 100 each 12  . lamoTRIgine (LAMICTAL) 25 MG tablet Take 3 tablets (75 mg total) by mouth as directed. Take 25 mg in the AM and 50 mg PM 270 tablet 1  . metoCLOPramide (REGLAN) 10 MG tablet Take  1 tablet (10 mg total) by mouth 3 (three) times daily with meals. 90 tablet 1  . nitroGLYCERIN (NITROSTAT) 0.4 MG SL tablet Place 1 tablet (0.4 mg total) under the tongue every 5 (five)  minutes as needed for chest pain. (Patient not taking: Reported on 12/19/2018) 30 tablet 0  . pantoprazole (PROTONIX) 40 MG tablet TAKE 1 TABLET BY MOUTH EVERY DAY 90 tablet 3  . PARoxetine (PAXIL) 30 MG tablet Take 1 tablet (30 mg total) by mouth daily. 90 tablet 1  . potassium chloride SA (K-DUR,KLOR-CON) 20 MEQ tablet Take 2 tablets (40 mEq total) by mouth 2 (two) times daily. 360 tablet 1  . risperiDONE (RISPERDAL) 1 MG tablet Take 1 tablet (1 mg total) by mouth at bedtime. Start with half tablet for 5 days and then increase to one tablet 30 tablet 0  . sacubitril-valsartan (ENTRESTO) 49-51 MG Take 1 tablet by mouth 2 (two) times daily. 180 tablet 1  . spironolactone (ALDACTONE) 25 MG tablet Take 25 mg by mouth daily.    . sucralfate (CARAFATE) 1 g tablet TAKE 1 TABLET (1 G TOTAL) BY MOUTH 4 (FOUR) TIMES DAILY. 360 tablet 3   No current facility-administered medications for this visit.      Musculoskeletal: Strength & Muscle Tone: within normal limits Gait & Station: normal Patient leans: N/A  Psychiatric Specialty Exam: Review of Systems  Psychiatric/Behavioral: Positive for depression. The patient has insomnia.   All other systems reviewed and are negative.   There were no vitals taken for this visit.There is no height or weight on file to calculate BMI.  General Appearance: Casual  Eye Contact:  Fair  Speech:  Clear and Coherent  Volume:  Normal  Mood:  Depressed  Affect:  Congruent  Thought Process:  Goal Directed and Descriptions of Associations: Intact  Orientation:  Full (Time, Place, and Person)  Thought Content: Hallucinations: Auditory asking her to snap  Suicidal Thoughts:  No  Homicidal Thoughts:  No  Memory:  Immediate;   Fair Recent;   Fair Remote;   Fair  Judgement:  Fair  Insight:  Fair  Psychomotor Activity:  Normal  Concentration:  Concentration: Fair and Attention Span: Fair  Recall:  [employer] of Knowledge: Fair  Language: Fair  Akathisia:  No   Handed:  Right  AIMS (if indicated): Denies tremors, rigidity, stiffness  Assets:  Communication Skills Desire for Improvement Housing Social Support  ADL's:  Intact  Cognition: WNL  Sleep:  Poor   Screenings: GAD-7     Office Visit from 12/28/2016 in [facility]  Total GAD-7 Score  21    PHQ2-9     Patient Outreach Telephone from 06/16/2018 in [facility] Office Visit from 05/08/2018 in [facility] Office Visit from 04/23/2018 in [facility] from 02/19/2018 in [facility] Office Visit from 12/24/2017 in [facility]  PHQ-2 Total Score  0  1  0  2  0  PHQ-9 Total Score  -  -  -  7  -       Assessment and Plan: Rheagan is a 52 year old African-American female who has a history of bipolar disorder, PTSD, panic disorder, cannabis use disorder, congestive heart failure, liver disease, chronic renal failure, diabetes melitis, OSA, hypertension, RLS was evaluated by telemedicine today.  Patient currently continues to struggle with depressive symptoms and has been noncompliant with  medications as prescribed last visit.  We will continue to make medication changes as discussed below.  Plan  For bipolar disorder-unstable Discontinue Seroquel for lack of efficacy Start risperidone 0.5 mg for the next 5 days and increase to 1 mg at bedtime. Increase lamotrigine to 75 mg p.o. daily in divided dosage, this was done last visit however she never picked up the prescription. Paxil 30 mg p.o. daily.  For PTSD-stable Paxil as prescribed  For panic disorder-stable Paxil as prescribed.  For insomnia-unstable Discontinue Seroquel Start Lunesta 1 mg p.o. nightly as needed.  Discussed with patient to start the Lunesta after a week of starting risperidone if she continues to have sleep problems. Continue CPAP for OSA.  For  cannabis use disorder in early remission Continue to monitor closely.  For alcohol use disorder in remission She quit in 2006, occasional use only now.  Collateral information was obtained from spouse-Bonnie Ochoa as summarized above.  Patient advised to call the front desk to schedule an appointment with therapist.  Follow-up in clinic in 2 weeks or sooner if needed.  June 9 at 4:45 PM.  I have spent atleast 25 minutes non face to face with patient today. More than 50 % of the time was spent for psychoeducation and supportive psychotherapy and care coordination.  This note was generated in part or whole with voice recognition software. Voice recognition is usually quite accurate but there are transcription errors that can and very often do occur. I apologize for any typographical errors that were not detected and corrected.           [name], MD 01/07/2019, 12:46 PM

## 2019-01-08 ENCOUNTER — Telehealth: Payer: Self-pay

## 2019-01-08 DIAGNOSIS — F5105 Insomnia due to other mental disorder: Secondary | ICD-10-CM

## 2019-01-08 MED ORDER — ZOLPIDEM TARTRATE 5 MG PO TABS: 5.0000 mg | ORAL_TABLET | Freq: Every evening | ORAL | 0 refills | Status: DC | PRN

## 2019-01-08 NOTE — Telephone Encounter (Signed)
pt called left message that she could not afford the $54 to get the medication you prescriped is it anything else

## 2019-01-08 NOTE — Telephone Encounter (Signed)
Sent Ambien to pharmacy , stopped lunesta for cost . Discussed with wife Joanie Coddington - patient unavailable.

## 2019-01-13 ENCOUNTER — Telehealth: Payer: Self-pay

## 2019-01-13 NOTE — Telephone Encounter (Signed)
Left message for patient to remind of missed remote transmission.  

## 2019-01-14 ENCOUNTER — Encounter (HOSPITAL_COMMUNITY): Payer: Self-pay

## 2019-01-14 ENCOUNTER — Telehealth (HOSPITAL_COMMUNITY): Payer: Self-pay

## 2019-01-14 NOTE — Telephone Encounter (Signed)
Today was a telephone visit with Bonnie Ochoa.  Talked with her and spouse.  She states doing well.  She has cataract surgery scheduled for this month.  She has all her medications and verified.  She is sleeping better at night and staying up during the day.  She denies chest pain, headaches, dizziness or shortness of breath.  They still have not got a hold of the company to get new cord for her cardiac monitor, will try my self to contact them.  Her sugar has been good lately.  She is watching her diet closely.  She has cut out sweets, watching low sodium foods and fluids.  She is walking some.  Will continue to visit for heart failure, medication and diet.   Earmon Phoenix [place] EMT-Paramedic [phone]

## 2019-01-14 NOTE — Progress Notes (Signed)
Contacted [employer] and they are sending a new cord for her transmission.  Should be there in 7 to 10 days and they will have to send old one back with the shipping label provided.  There is no charge.  Bonnie Ochoa spouse.  Earmon Phoenix [place] EMT-Paramedic [phone]

## 2019-01-17 ENCOUNTER — Other Ambulatory Visit: Payer: Self-pay | Admitting: Family Medicine

## 2019-01-17 NOTE — Telephone Encounter (Signed)
Requested Prescriptions  Pending Prescriptions Disp Refills  . dicyclomine (BENTYL) 20 MG tablet [Pharmacy Med Name: DICYCLOMINE 20 MG TABLET] 270 tablet 0    Sig: TAKE 1 TABLET (20 MG TOTAL) BY MOUTH 3 (THREE) TIMES DAILY AS NEEDED FOR SPASMS.     Gastroenterology:  Antispasmodic Agents Passed - 01/17/2019 12:56 AM      Passed - Last Heart Rate in normal range    Pulse Readings from Last 1 Encounters:  11/03/18 73         Passed - Valid encounter within last 12 months    Recent Outpatient Visits          1 month ago Type 2 diabetes mellitus with stage 3 chronic kidney disease, with long-term current use of insulin ([place])   [facility] [place], Megan P, DO   2 months ago AKI (acute kidney injury) ([place])   [place], Megan P, DO   4 months ago Chest pain, unspecified type   [facility] Megan P, DO   5 months ago Type 2 diabetes mellitus with stage 3 chronic kidney disease, with long-term current use of insulin ([place])   [place], [place], DO   6 months ago Loss of vision   [employer], [place], DO      Future Appointments            In 2 weeks Wynetta Emery, Barb Merino, DO [facility], [place]   In 1 month  [facility], [place]   In 1 month [place], Megan P, DO [facility] [facility], [place]   In 1 month [place], Lennette Bihari, MD [place]

## 2019-01-19 NOTE — Progress Notes (Signed)
No ICM remote transmission received for 01/12/2019 and next ICM transmission scheduled for 02/17/2019.   

## 2019-01-20 ENCOUNTER — Ambulatory Visit (INDEPENDENT_AMBULATORY_CARE_PROVIDER_SITE_OTHER): Payer: Medicare Other | Admitting: Psychiatry

## 2019-01-20 ENCOUNTER — Encounter: Payer: Self-pay | Admitting: Psychiatry

## 2019-01-20 ENCOUNTER — Other Ambulatory Visit: Payer: Self-pay

## 2019-01-20 DIAGNOSIS — F410 Panic disorder [episodic paroxysmal anxiety] without agoraphobia: Secondary | ICD-10-CM | POA: Diagnosis not present

## 2019-01-20 DIAGNOSIS — F5105 Insomnia due to other mental disorder: Secondary | ICD-10-CM

## 2019-01-20 DIAGNOSIS — I200 Unstable angina: Secondary | ICD-10-CM

## 2019-01-20 DIAGNOSIS — F4312 Post-traumatic stress disorder, chronic: Secondary | ICD-10-CM

## 2019-01-20 DIAGNOSIS — F3132 Bipolar disorder, current episode depressed, moderate: Secondary | ICD-10-CM

## 2019-01-20 DIAGNOSIS — F1210 Cannabis abuse, uncomplicated: Secondary | ICD-10-CM

## 2019-01-20 MED ORDER — ZOLPIDEM TARTRATE 5 MG PO TABS: 5.0000 mg | ORAL_TABLET | Freq: Every evening | ORAL | 1 refills | Status: DC | PRN

## 2019-01-20 MED ORDER — RISPERIDONE 1 MG PO TABS: 1.0000 mg | ORAL_TABLET | Freq: Every day | ORAL | 0 refills | Status: DC

## 2019-01-20 NOTE — Progress Notes (Signed)
Virtual Visit via Video Note  I connected with Bonnie Ochoa on 01/20/19 at  4:45 PM EDT by a video enabled telemedicine application and verified that I am speaking with the correct person using two identifiers.   I discussed the limitations of evaluation and management by telemedicine and the availability of in person appointments. The patient expressed understanding and agreed to proceed.    I discussed the assessment and treatment plan with the patient. The patient was provided an opportunity to ask questions and all were answered. The patient agreed with the plan and demonstrated an understanding of the instructions.   The patient was advised to call back or seek an in-person evaluation if the symptoms worsen or if the condition fails to improve as anticipated.   [facility] MD OP Progress Note  01/20/2019 5:54 PM Bonnie Ochoa  MRN:  [id number]  Chief Complaint:  Chief Complaint    Follow-up     HPI: [name] is a 53 year old African-American female, engaged, lives in [place], has a history of bipolar disorder, PTSD, panic attacks, ADD, agoraphobia, cannabis use disorder, osteoarthritis, chronic renal failure, congestive heart failure, cirrhosis of liver, diabetes, hypertension, OSA on CPAP, RLS was evaluated by telemedicine today.  A video call was initiated however due to connection problem it had to be changed to a phone call.  Patient reports she is currently doing well on the current medication regimen.  She started taking risperidone and is on 1 mg at bedtime now.  She reports she has been doing better with regards to her mood symptoms.  She denies any side effects to the risperidone.  Patient reports she has been taking the Ambien every night which has been very helpful with her sleep.  That is an improvement for her since she was struggling with sleep the past few weeks.  She denies any side effects to the Ambien.  She has been using cannabis more frequently now due to the locked down  and stay at home order.  Provided substance abuse counseling.  She will try to cut back.  Collateral information was obtained from [place] who reports patient is making progress on the current medication regimen.  Patient denies any suicidality, homicidality or perceptual disturbances.   Visit Diagnosis:    ICD-10-CM   1. Bipolar disorder, current episode depressed, moderate (HCC) F31.32 risperiDONE (RISPERDAL) 1 MG tablet  2. Chronic post-traumatic stress disorder (PTSD) F43.12   3. Panic disorder F41.0   4. Cannabis use disorder, mild, abuse F12.10   5. Insomnia due to mental condition F51.05 zolpidem (AMBIEN) 5 MG tablet    Past Psychiatric History: I have reviewed past psychiatric history from my progress note on 10/16/2017.  Past Medical History:  Past Medical History:  Diagnosis Date  . Abdominal pain    a. 05/2018 HIDA scan wnl.  . ADHD   . Anemia   . Arthritis   . Asthma   . Bipolar 1 disorder ([place])   . Chest pain    a. Hx of cath in [place] - reportedly nl; b. 04/2018 MV: EF 22%, fixed dist ant septal, apical, and inferoapical defects - ? scar vs. attenuation. No ischemia.  . CHF (congestive heart failure) ([place])   . Cirrhosis of liver ([place])   . Coronary artery disease   . Depression   . Diabetes mellitus without complication ([place])   . Diverticulitis   . Dysrhythmia   . Heart murmur   . HFrEF (heart failure with reduced ejection fraction) ([place])  a. 06/2017 Echo: EF 20-25%, diff HK. Mildly dil LA; b. 05/2018 Echo: EF 25-30%, diff HK, Gr2 DD. Triv AI. Mod MR. Mildly reduced RV fxn. Mod-Sev TR. PASP 45-87mHg.  .Marland KitchenHypertension   . Hypothyroidism   . IBS (irritable bowel syndrome)   . Insomnia   . Migraines   . NICM (nonischemic cardiomyopathy) ([place]    a. EF prev 25%; b. 11/2014 s/p SJM Fortify [place], single lead AICD (ser# [id number]; c. 06/2017 Echo: EF 20-25%; d. 05/2018 Echo: EF 25-30%, diff HK, Gr2 DD; e. 05/2018 Cath: Nl cors. LVEDP 215mg, PCWP 3219m. PA  65/40 (52). CO/CI 3.04/1.56.  . OSA (obstructive sleep apnea)   . PTSD (post-traumatic stress disorder)   . Restless leg syndrome   . Sleep apnea   . Vertigo     Past Surgical History:  Procedure Laterality Date  . ABDOMINAL HYSTERECTOMY    . CARDIAC CATHETERIZATION    . CORONARY ARTERY BYPASS GRAFT    . debribalator  2016  . INSERT / REPLACE / REMOVE PACEMAKER    . INSERTION OF ICD    . RIGHT/LEFT HEART CATH AND CORONARY ANGIOGRAPHY N/A 06/09/2018   Procedure: RIGHT/LEFT HEART CATH AND CORONARY ANGIOGRAPHY;  Surgeon: AriWellington HampshireD;  Location[facility];  Service: Cardiovascular;  Laterality: N/A;  . TONSILLECTOMY    . TONSILLECTOMY    . TONSILLECTOMY    . TUBAL LIGATION  1980  . TUBAL LIGATION      Family Psychiatric History: Reviewed family psychiatric history from my progress note on 10/16/2017  Family History:  Family History  Problem Relation Age of Onset  . Hypertension Mother   . Hyperlipidemia Mother   . Heart disease Mother   . Hypertension Sister   . Asthma Sister   . Heart disease Sister   . Diabetes Sister   . Cancer Sister   . Alzheimer's disease Maternal Grandfather   . Hyperlipidemia Brother   . Asthma Sister   . Hypertension Sister   . Diabetes Sister     Social History: Reviewed social history from my progress note on 10/16/2017. Social History   Socioeconomic History  . Marital status: Married    Spouse name: Not on file  . Number of children: 2  . Years of education: Not on file  . Highest education level: High school graduate  Occupational History    Comment: disabled  Social Needs  . Financial resource strain: Not hard at all  . Food insecurity:    Worry: Sometimes true    Inability: Sometimes true  . Transportation needs:    Medical: Yes    Non-medical: Yes  Tobacco Use  . Smoking status: Former Smoker    Types: Cigarettes  . Smokeless tobacco: Never Used  . Tobacco comment: quit about 20 years ago   Substance  and Sexual Activity  . Alcohol use: Not Currently  . Drug use: Not Currently    Frequency: 1.0 times per week    Types: Marijuana  . Sexual activity: Yes    Birth control/protection: Surgical    Comment: one partner  Lifestyle  . Physical activity:    Days per week: 0 days    Minutes per session: 0 min  . Stress: Very much  Relationships  . Social connections:    Talks on phone: Never    Gets together: Twice a week    Attends religious service: Never    Active member of club or organization: No  Attends meetings of clubs or organizations: Never    Relationship status: Married  Other Topics Concern  . Not on file  Social History Narrative   Volunteers occasionally     Allergies:  Allergies  Allergen Reactions  . Levothyroxine Rash    Metabolic Disorder Labs: Lab Results  Component Value Date   HGBA1C 8.3 (H) 11/03/2018   MPG 185.77 05/08/2018   MPG >398 07/03/2017   No results found for: PROLACTIN Lab Results  Component Value Date   CHOL 303 (H) 11/03/2018   TRIG 1,246 (HH) 11/03/2018   HDL 24 (L) 11/03/2018   CHOLHDL 5.4 05/09/2018   VLDL 53 (H) 05/09/2018   Kenneth Comment 11/03/2018   LDLCALC 84 05/09/2018   Lab Results  Component Value Date   TSH 1.651 10/24/2018   TSH 1.560 02/19/2018    Therapeutic Level Labs: No results found for: LITHIUM No results found for: VALPROATE No components found for:  CBMZ  Current Medications: Current Outpatient Medications  Medication Sig Dispense Refill  . atorvastatin (LIPITOR) 80 MG tablet Take 1 tablet (80 mg total) by mouth daily at 6 PM. 90 tablet 1  . blood glucose meter kit and supplies KIT Dispense based on patient and insurance preference. Use up to four times daily as directed. (FOR ICD-9 250.00, 250.01). 1 each 0  . carvedilol (COREG) 12.5 MG tablet Take 1 tablet (12.5 mg total) by mouth 2 (two) times daily. 180 tablet 3  . cyclobenzaprine (FLEXERIL) 5 MG tablet Take 5 mg by mouth 3 (three) times  daily as needed for muscle spasms.    Marland Kitchen dicyclomine (BENTYL) 20 MG tablet TAKE 1 TABLET (20 MG TOTAL) BY MOUTH 3 (THREE) TIMES DAILY AS NEEDED FOR SPASMS. 270 tablet 0  . digoxin (LANOXIN) 0.125 MG tablet Take 1 tablet (0.125 mg total) by mouth daily. 90 tablet 3  . Dulaglutide (TRULICITY) 1.85 UD/1.4HF SOPN Inject 0.75 mg into the skin once a week. 4 pen 3  . empagliflozin (JARDIANCE) 25 MG TABS tablet Take 25 mg by mouth daily. 90 tablet 1  . famotidine (PEPCID) 40 MG tablet TAKE 1 TABLET BY MOUTH EVERY DAY IN THE EVENING 90 tablet 1  . furosemide (LASIX) 40 MG tablet Take 1 tablet (40 mg total) by mouth daily. 30 tablet 6  . Insulin Glargine (LANTUS SOLOSTAR) 100 UNIT/ML Solostar Pen Inject 20 Units into the skin daily at 10 pm. 5 pen 12  . Insulin Pen Needle 32G X 6 MM MISC 1 each by Does not apply route daily. 100 each 12  . lamoTRIgine (LAMICTAL) 25 MG tablet Take 3 tablets (75 mg total) by mouth as directed. Take 25 mg in the AM and 50 mg PM 270 tablet 1  . metoCLOPramide (REGLAN) 10 MG tablet Take 1 tablet (10 mg total) by mouth 3 (three) times daily with meals. 90 tablet 1  . nitroGLYCERIN (NITROSTAT) 0.4 MG SL tablet Place 1 tablet (0.4 mg total) under the tongue every 5 (five) minutes as needed for chest pain. (Patient not taking: Reported on 12/19/2018) 30 tablet 0  . pantoprazole (PROTONIX) 40 MG tablet TAKE 1 TABLET BY MOUTH EVERY DAY 90 tablet 3  . PARoxetine (PAXIL) 30 MG tablet Take 1 tablet (30 mg total) by mouth daily. 90 tablet 1  . potassium chloride SA (K-DUR,KLOR-CON) 20 MEQ tablet Take 2 tablets (40 mEq total) by mouth 2 (two) times daily. 360 tablet 1  . risperiDONE (RISPERDAL) 1 MG tablet Take 1 tablet (1 mg total)  by mouth at bedtime. 90 tablet 0  . sacubitril-valsartan (ENTRESTO) 49-51 MG Take 1 tablet by mouth 2 (two) times daily. 180 tablet 1  . spironolactone (ALDACTONE) 25 MG tablet Take 25 mg by mouth daily.    . sucralfate (CARAFATE) 1 g tablet TAKE 1 TABLET (1 G  TOTAL) BY MOUTH 4 (FOUR) TIMES DAILY. 360 tablet 3  . [START ON 02/05/2019] zolpidem (AMBIEN) 5 MG tablet Take 1 tablet (5 mg total) by mouth at bedtime as needed for sleep. 30 tablet 1   No current facility-administered medications for this visit.      Musculoskeletal: Strength & Muscle Tone: reports as WNL Gait & Station: Reports as wnl Patient leans: N/A  Psychiatric Specialty Exam: Review of Systems  Psychiatric/Behavioral: Positive for depression. The patient is nervous/anxious.   All other systems reviewed and are negative.   There were no vitals taken for this visit.There is no height or weight on file to calculate BMI.  General Appearance: UTA  Eye Contact:  UTA  Speech:  Clear and Coherent  Volume:  Normal  Mood:  Anxious and Depressed Improving  Affect:  UTA  Thought Process:  Goal Directed and Descriptions of Associations: Intact  Orientation:  Full (Time, Place, and Person)  Thought Content: Logical   Suicidal Thoughts:  No  Homicidal Thoughts:  No  Memory:  Immediate;   Fair Recent;   Fair Remote;   Fair  Judgement:  Fair  Insight:  Good  Psychomotor Activity:  UTA  Concentration:  Concentration: Fair and Attention Span: Fair  Recall:  [employer] of Knowledge: Fair  Language: Fair  Akathisia:  No  Handed:  Right  AIMS (if indicated): denies tremors, rigidity  Assets:  Communication Skills Desire for Improvement Social Support  ADL's:  Intact  Cognition: WNL  Sleep:  improving   Screenings: GAD-7     Office Visit from 12/28/2016 in [facility]  Total GAD-7 Score  21    PHQ2-9     Patient Outreach Telephone from 06/16/2018 in [facility] Office Visit from 05/08/2018 in [facility] Office Visit from 04/23/2018 in [facility] from 02/19/2018 in [facility] Office Visit from 12/24/2017 in [facility]  PHQ-2 Total Score  0  1  0  2  0  PHQ-9 Total Score  -  -  -  7  -       Assessment and Plan: Rhianon is a 53 year old African-American female who has a history of bipolar disorder, PTSD, panic disorder, cannabis use disorder, congestive heart failure, liver disease, chronic renal failure, diabetes melitis, OSA, hypertension, RLS was evaluated by telemedicine today.  Patient is currently making progress on the current medication regimen.  Plan as noted below.  Plan For bipolar disorder-improving Risperidone 1 mg p.o. nightly Lamotrigine 75 mg p.o. daily in divided dosage. Paxil 30 mg p.o. daily.  For PTSD- stable Paxil as prescribed  For panic disorder- improving Paxil as prescribed  For insomnia- improving Ambien 5 mg p.o. nightly as needed Continue CPAP for OSA.  Cannabis use disorder-mild abuse Provided substance abuse counseling Continue to monitor closely.  For alcohol use disorder in remission She quit in 2006, occasional use only now.  Collateral information was obtained from spouse-Latricia-summarized above.  Patient was referred for CBT- has upcoming appointment with therapist.  Follow-up in clinic in 4 weeks or sooner if needed.  July 6 at 4:45 PM.  I have spent atleast 15 minutes non face to face with patient today. More than 50 % of the time was spent for psychoeducation and supportive psychotherapy and care coordination.  This note was generated in part or whole with voice recognition software. Voice recognition is usually quite accurate but there are transcription errors that can and very often do occur. I apologize for any typographical errors that were not detected and corrected.            [name], MD 01/20/2019, 5:54 PM

## 2019-01-21 ENCOUNTER — Encounter: Payer: Self-pay | Admitting: *Deleted

## 2019-01-21 ENCOUNTER — Other Ambulatory Visit: Payer: Self-pay

## 2019-01-22 NOTE — Anesthesia Preprocedure Evaluation (Addendum)
Anesthesia Evaluation  Patient identified by MRN, date of birth, ID band Patient awake    Reviewed: Allergy & Precautions, NPO status , Patient's Chart, lab work & pertinent test results  History of Anesthesia Complications Negative for: history of anesthetic complications  Airway Mallampati: IV   Neck ROM: Full    Dental  (+)    Pulmonary asthma , sleep apnea and Continuous Positive Airway Pressure Ventilation , former smoker (quit 1985),    Pulmonary exam normal breath sounds clear to auscultation       Cardiovascular hypertension, + CAD and +CHF  Normal cardiovascular exam+ dysrhythmias  Rhythm:Regular Rate:Normal  NICM, HFrEF (20%), murmur  ECG 10/25/18: Sinus tachycardia, LVH, no STEMI.   Neuro/Psych  Headaches, PSYCHIATRIC DISORDERS (PTSD, ADHD) Anxiety Depression Hx EtOH abuse, vertigo, RLS    GI/Hepatic negative GI ROS, (+) Cirrhosis       ,   Endo/Other  diabetes, Poorly Controlled, Type 2, Insulin DependentHypothyroidism   Renal/GU Renal disease (stage II CKD)     Musculoskeletal  (+) Arthritis ,   Abdominal   Peds  Hematology  (+) Blood dyscrasia, anemia ,   Anesthesia Other Findings   Reproductive/Obstetrics                            Anesthesia Physical Anesthesia Plan  ASA: III  Anesthesia Plan: MAC   Post-op Pain Management:    Induction: Intravenous  PONV Risk Score and Plan: 2 and TIVA and Midazolam  Airway Management Planned: Natural Airway  Additional Equipment:   Intra-op Plan:   Post-operative Plan:   Informed Consent: I have reviewed the patients History and Physical, chart, labs and discussed the procedure including the risks, benefits and alternatives for the proposed anesthesia with the patient or authorized representative who has indicated his/her understanding and acceptance.       Plan Discussed with: CRNA  Anesthesia Plan Comments:         Anesthesia Quick Evaluation

## 2019-01-22 NOTE — Discharge Instructions (Signed)

## 2019-01-23 ENCOUNTER — Other Ambulatory Visit
Admission: RE | Admit: 2019-01-23 | Discharge: 2019-01-23 | Disposition: A | Discharge status: Home / Self Care | Payer: Medicare Other | Source: Ambulatory Visit | Attending: Ophthalmology | Admitting: Ophthalmology

## 2019-01-23 ENCOUNTER — Other Ambulatory Visit: Payer: Self-pay

## 2019-01-23 DIAGNOSIS — Z1159 Encounter for screening for other viral diseases: Secondary | ICD-10-CM | POA: Insufficient documentation

## 2019-01-23 DIAGNOSIS — Z01812 Encounter for preprocedural laboratory examination: Secondary | ICD-10-CM | POA: Insufficient documentation

## 2019-01-24 LAB — NOVEL CORONAVIRUS, NAA (HOSP ORDER, SEND-OUT TO REF LAB; TAT 18-24 HRS): SARS-CoV-2, NAA: NOT DETECTED

## 2019-01-27 ENCOUNTER — Encounter
Admission: RE | Disposition: A | Discharge status: Home / Self Care | Payer: Self-pay | Source: Home / Self Care | Attending: Ophthalmology

## 2019-01-27 ENCOUNTER — Other Ambulatory Visit: Payer: Self-pay

## 2019-01-27 ENCOUNTER — Ambulatory Visit: Payer: Medicare Other | Admitting: Anesthesiology

## 2019-01-27 ENCOUNTER — Ambulatory Visit
Admission: RE | Admit: 2019-01-27 | Discharge: 2019-01-27 | Disposition: A | Discharge status: Home / Self Care | Payer: Medicare Other | Attending: Ophthalmology | Admitting: Ophthalmology

## 2019-01-27 ENCOUNTER — Ambulatory Visit: Payer: Medicare Other | Admitting: Licensed Clinical Social Worker

## 2019-01-27 DIAGNOSIS — Z87891 Personal history of nicotine dependence: Secondary | ICD-10-CM | POA: Insufficient documentation

## 2019-01-27 DIAGNOSIS — K219 Gastro-esophageal reflux disease without esophagitis: Secondary | ICD-10-CM | POA: Diagnosis not present

## 2019-01-27 DIAGNOSIS — H2589 Other age-related cataract: Secondary | ICD-10-CM | POA: Diagnosis not present

## 2019-01-27 DIAGNOSIS — E1136 Type 2 diabetes mellitus with diabetic cataract: Secondary | ICD-10-CM | POA: Insufficient documentation

## 2019-01-27 DIAGNOSIS — F319 Bipolar disorder, unspecified: Secondary | ICD-10-CM | POA: Insufficient documentation

## 2019-01-27 DIAGNOSIS — I130 Hypertensive heart and chronic kidney disease with heart failure and stage 1 through stage 4 chronic kidney disease, or unspecified chronic kidney disease: Secondary | ICD-10-CM | POA: Insufficient documentation

## 2019-01-27 DIAGNOSIS — Z794 Long term (current) use of insulin: Secondary | ICD-10-CM | POA: Diagnosis not present

## 2019-01-27 DIAGNOSIS — K589 Irritable bowel syndrome without diarrhea: Secondary | ICD-10-CM | POA: Insufficient documentation

## 2019-01-27 DIAGNOSIS — I5020 Unspecified systolic (congestive) heart failure: Secondary | ICD-10-CM | POA: Diagnosis not present

## 2019-01-27 DIAGNOSIS — E1165 Type 2 diabetes mellitus with hyperglycemia: Secondary | ICD-10-CM | POA: Insufficient documentation

## 2019-01-27 DIAGNOSIS — I2510 Atherosclerotic heart disease of native coronary artery without angina pectoris: Secondary | ICD-10-CM | POA: Insufficient documentation

## 2019-01-27 DIAGNOSIS — G4730 Sleep apnea, unspecified: Secondary | ICD-10-CM | POA: Insufficient documentation

## 2019-01-27 DIAGNOSIS — E7800 Pure hypercholesterolemia, unspecified: Secondary | ICD-10-CM | POA: Insufficient documentation

## 2019-01-27 DIAGNOSIS — F419 Anxiety disorder, unspecified: Secondary | ICD-10-CM | POA: Diagnosis not present

## 2019-01-27 DIAGNOSIS — Z79899 Other long term (current) drug therapy: Secondary | ICD-10-CM | POA: Diagnosis not present

## 2019-01-27 DIAGNOSIS — H25811 Combined forms of age-related cataract, right eye: Secondary | ICD-10-CM | POA: Diagnosis not present

## 2019-01-27 DIAGNOSIS — Z951 Presence of aortocoronary bypass graft: Secondary | ICD-10-CM | POA: Diagnosis not present

## 2019-01-27 DIAGNOSIS — N182 Chronic kidney disease, stage 2 (mild): Secondary | ICD-10-CM | POA: Diagnosis not present

## 2019-01-27 DIAGNOSIS — E1122 Type 2 diabetes mellitus with diabetic chronic kidney disease: Secondary | ICD-10-CM | POA: Diagnosis not present

## 2019-01-27 DIAGNOSIS — H2511 Age-related nuclear cataract, right eye: Secondary | ICD-10-CM | POA: Insufficient documentation

## 2019-01-27 HISTORY — PX: CATARACT EXTRACTION W/PHACO: SHX586

## 2019-01-27 LAB — GLUCOSE, CAPILLARY
Glucose-Capillary: 326 mg/dL — ABNORMAL HIGH (ref 70–99)
Glucose-Capillary: 253 mg/dL — ABNORMAL HIGH (ref 70–99)

## 2019-01-27 SURGERY — PHACOEMULSIFICATION, CATARACT, WITH IOL INSERTION
Anesthesia: Monitor Anesthesia Care | Site: Eye | Laterality: Right

## 2019-01-27 MED ORDER — TRYPAN BLUE 0.06 % OP SOLN
OPHTHALMIC | Status: DC | PRN
  Administered 2019-01-27: 0.5 mL via INTRAOCULAR

## 2019-01-27 MED ORDER — NON FORMULARY
10.0000 [IU] | Freq: Once | Status: AC
  Administered 2019-01-27: 09:00:00 10 [IU] via INTRAVENOUS

## 2019-01-27 MED ORDER — NA CHONDROIT SULF-NA HYALURON 40-17 MG/ML IO SOLN
INTRAOCULAR | Status: DC | PRN
  Administered 2019-01-27: 1 mL via INTRAOCULAR

## 2019-01-27 MED ORDER — TETRACAINE HCL 0.5 % OP SOLN
1.0000 [drp] | OPHTHALMIC | Status: DC | PRN
  Administered 2019-01-27 (×3): 1 [drp] via OPHTHALMIC

## 2019-01-27 MED ORDER — BRIMONIDINE TARTRATE-TIMOLOL 0.2-0.5 % OP SOLN
OPHTHALMIC | Status: DC | PRN
  Administered 2019-01-27: 1 [drp] via OPHTHALMIC

## 2019-01-27 MED ORDER — ARMC OPHTHALMIC DILATING DROPS
1.0000 "application " | OPHTHALMIC | Status: DC | PRN
  Administered 2019-01-27 (×3): 1 via OPHTHALMIC

## 2019-01-27 MED ORDER — ONDANSETRON HCL 4 MG/2ML IJ SOLN: 4.0000 mg | Freq: Once | INTRAMUSCULAR | Status: DC | PRN

## 2019-01-27 MED ORDER — NON FORMULARY
10.0000 [IU] | Freq: Once | Status: AC
  Administered 2019-01-27: 10 [IU] via INTRAVENOUS

## 2019-01-27 MED ORDER — MIDAZOLAM HCL 2 MG/2ML IJ SOLN
INTRAMUSCULAR | Status: DC | PRN
  Administered 2019-01-27: 2 mg via INTRAVENOUS

## 2019-01-27 MED ORDER — MOXIFLOXACIN HCL 0.5 % OP SOLN
OPHTHALMIC | Status: DC | PRN
  Administered 2019-01-27: 0.2 mL via OPHTHALMIC

## 2019-01-27 MED ORDER — EPINEPHRINE PF 1 MG/ML IJ SOLN
INTRAOCULAR | Status: DC | PRN
  Administered 2019-01-27: 09:00:00 58 mL via OPHTHALMIC

## 2019-01-27 MED ORDER — LIDOCAINE HCL (PF) 2 % IJ SOLN
INTRAOCULAR | Status: DC | PRN
  Administered 2019-01-27: 09:00:00 2 mL

## 2019-01-27 MED ORDER — FENTANYL CITRATE (PF) 100 MCG/2ML IJ SOLN
INTRAMUSCULAR | Status: DC | PRN
  Administered 2019-01-27: 50 ug via INTRAVENOUS

## 2019-01-27 SURGICAL SUPPLY — 19 items
CANNULA ANT/CHMB 27G (MISCELLANEOUS) ×1 IMPLANT
CANNULA ANT/CHMB 27GA (MISCELLANEOUS) ×6 IMPLANT
GLOVE SURG LX 8.0 MICRO (GLOVE) ×2
GLOVE SURG LX STRL 8.0 MICRO (GLOVE) ×1 IMPLANT
GLOVE SURG TRIUMPH 8.0 PF LTX (GLOVE) ×3 IMPLANT
GOWN STRL REUS W/ TWL LRG LVL3 (GOWN DISPOSABLE) ×2 IMPLANT
GOWN STRL REUS W/TWL LRG LVL3 (GOWN DISPOSABLE) ×4
LENS IOL TECNIS 16.5 (Intraocular Lens) ×2 IMPLANT
MARKER SKIN DUAL TIP RULER LAB (MISCELLANEOUS) ×3 IMPLANT
NDL FILTER BLUNT 18X1 1/2 (NEEDLE) ×1 IMPLANT
NDL RETROBULBAR .5 NSTRL (NEEDLE) ×3 IMPLANT
NEEDLE FILTER BLUNT 18X 1/2SAF (NEEDLE) ×2
NEEDLE FILTER BLUNT 18X1 1/2 (NEEDLE) ×1 IMPLANT
PACK EYE AFTER SURG (MISCELLANEOUS) ×3 IMPLANT
PACK OPTHALMIC (MISCELLANEOUS) ×3 IMPLANT
PACK PORFILIO (MISCELLANEOUS) ×3 IMPLANT
SYR 3ML LL SCALE MARK (SYRINGE) ×3 IMPLANT
SYR TB 1ML LUER SLIP (SYRINGE) ×3 IMPLANT
WIPE NON LINTING 3.25X3.25 (MISCELLANEOUS) ×3 IMPLANT

## 2019-01-27 NOTE — Anesthesia Postprocedure Evaluation (Signed)
Anesthesia Post Note  Patient: Bonnie Ochoa  Procedure(s) Performed: CATARACT EXTRACTION PHACO AND INTRAOCULAR LENS PLACEMENT (IOC)  VISION BLUE RIGHT DIABETES (Right Eye)  Patient location during evaluation: PACU Anesthesia Type: MAC Level of consciousness: awake and alert, oriented and patient cooperative Pain management: pain level controlled Vital Signs Assessment: post-procedure vital signs reviewed and stable Respiratory status: spontaneous breathing, nonlabored ventilation and respiratory function stable Cardiovascular status: blood pressure returned to baseline and stable Postop Assessment: adequate PO intake Anesthetic complications: no    Darrin Nipper

## 2019-01-27 NOTE — Anesthesia Procedure Notes (Signed)
Procedure Name: MAC Date/Time: 01/27/2019 8:28 AM Performed by: Cameron Ali, CRNA Pre-anesthesia Checklist: Patient identified, Emergency Drugs available, Suction available, Timeout performed and Patient being monitored Patient Re-evaluated:Patient Re-evaluated prior to induction Oxygen Delivery Method: Nasal cannula Placement Confirmation: positive ETCO2

## 2019-01-27 NOTE — Op Note (Signed)
PREOPERATIVE DIAGNOSIS:  Nuclear sclerotic cataract of the right eye.   POSTOPERATIVE DIAGNOSIS:  H15.89 CATARACT   OPERATIVE PROCEDURE: Procedure(s): CATARACT EXTRACTION PHACO AND INTRAOCULAR LENS PLACEMENT ([place])  VISION BLUE RIGHT DIABETES   SURGEON:  Birder Robson, MD.   ANESTHESIA:  Anesthesiologist: Darrin Nipper, MD CRNA: Cameron Ali, CRNA; Janna Arch, CRNA  1.      Managed anesthesia care. 2.      0.87ml of Shugarcaine was instilled in the eye following the paracentesis.   COMPLICATIONS:  Vision Blue was used to stain the anterior capsule due to very poor/ no visualization of the red reflex. .   TECHNIQUE:   Stop and chop   DESCRIPTION OF PROCEDURE:  The patient was examined and consented in the preoperative holding area where the aforementioned topical anesthesia was applied to the right eye and then brought back to the Operating Room where the right eye was prepped and draped in the usual sterile ophthalmic fashion and a lid speculum was placed. A paracentesis was created with the side port blade and the anterior chamber was filled with viscoelastic. A near clear corneal incision was performed with the steel keratome. A continuous curvilinear capsulorrhexis was performed with a cystotome followed by the capsulorrhexis forceps. Hydrodissection and hydrodelineation were carried out with BSS on a blunt cannula. The lens was removed in a stop and chop  technique and the remaining cortical material was removed with the irrigation-aspiration handpiece. The capsular bag was inflated with viscoelastic and the Technis [id number]  lens was placed in the capsular bag without complication. The remaining viscoelastic was removed from the eye with the irrigation-aspiration handpiece. The wounds were hydrated. The anterior chamber was flushed with BSS and the eye was inflated to physiologic pressure. 0.49ml of Vigamox was placed in the anterior chamber. The wounds were found to be water tight.  The eye was dressed with Combigan. The patient was given protective glasses to wear throughout the day and a shield with which to sleep tonight. The patient was also given drops with which to begin a drop regimen today and will follow-up with me in one day. Implant Name Type Inv. Item Serial No. Manufacturer Lot No. LRB No. Used Action  LENS IOL TECNIS 16.5 - [id number] Intraocular Lens LENS IOL TECNIS 16.5 [id number] AMO  Right 1 Implanted   Procedure(s) with comments: CATARACT EXTRACTION PHACO AND INTRAOCULAR LENS PLACEMENT (I[facility])  VISION BLUE RIGHT DIABETES (Right) - Diabetes - insulin sleep apnea  Electronically signed: Birder Robson 01/27/2019 8:48 AM

## 2019-01-27 NOTE — H&P (Signed)
All labs reviewed. Abnormal studies sent to patients PCP when indicated.  Previous H&P reviewed, patient examined, there are NO CHANGES.  Bonnie Templeman Porfilio6/16/20208:21 AM

## 2019-01-27 NOTE — Transfer of Care (Signed)
Immediate Anesthesia Transfer of Care Note  Patient: Bonnie Ochoa  Procedure(s) Performed: CATARACT EXTRACTION PHACO AND INTRAOCULAR LENS PLACEMENT (I[facility])  VISION BLUE RIGHT DIABETES (Right Eye)  Patient Location: PACU  Anesthesia Type: MAC  Level of Consciousness: awake, alert  and patient cooperative  Airway and Oxygen Therapy: Patient Spontanous Breathing and Patient connected to supplemental oxygen  Post-op Assessment: Post-op Vital signs reviewed, Patient's Cardiovascular Status Stable, Respiratory Function Stable, Patent Airway and No signs of Nausea or vomiting  Post-op Vital Signs: Reviewed and stable  Complications: No apparent anesthesia complications

## 2019-01-28 ENCOUNTER — Encounter: Payer: Self-pay | Admitting: Ophthalmology

## 2019-01-28 ENCOUNTER — Telehealth: Payer: Self-pay

## 2019-01-28 NOTE — Telephone Encounter (Signed)
Called and left a message for patient to call me back.  Copied from [place] [id number]. Topic: Appointment Scheduling - Scheduling Inquiry for Clinic >> Jan 26, 2019  4:51 PM Mathis Bud wrote: Reason for CRM: patient called stating she had a hep B shot back in march.  Patient has some follow up questions due to shot.   Call back [phone]

## 2019-01-29 ENCOUNTER — Other Ambulatory Visit: Payer: Self-pay

## 2019-01-29 ENCOUNTER — Telehealth (HOSPITAL_COMMUNITY): Payer: Self-pay

## 2019-01-29 ENCOUNTER — Ambulatory Visit: Payer: Medicare Other | Admitting: Licensed Clinical Social Worker

## 2019-01-29 ENCOUNTER — Ambulatory Visit: Payer: Medicare Other | Admitting: Psychiatry

## 2019-01-29 NOTE — Telephone Encounter (Signed)
Called and spoke with patient, to let her know why she was given a Hep B

## 2019-02-04 ENCOUNTER — Ambulatory Visit: Payer: Self-pay | Admitting: Family Medicine

## 2019-02-09 ENCOUNTER — Ambulatory Visit: Payer: Medicare Other | Admitting: Family Medicine

## 2019-02-09 NOTE — Progress Notes (Deleted)
   There were no vitals taken for this visit.   Subjective:    Patient ID: Bonnie Ochoa, female    DOB: 1966/06/30, 53 y.o.   MRN: [id number]  HPI: Bonnie Ochoa is a 53 y.o. female  No chief complaint on file.  DIABETES Hypoglycemic episodes:{Blank single:19197::"yes","no"} Polydipsia/polyuria: {Blank single:19197::"yes","no"} Visual disturbance: {Blank single:19197::"yes","no"} Chest pain: {Blank single:19197::"yes","no"} Paresthesias: {Blank single:19197::"yes","no"} Glucose Monitoring: {Blank single:19197::"yes","no"}  Accucheck frequency: {Blank single:19197::"Not Checking","Daily","BID","TID"}  Fasting glucose:  Post prandial:  Evening:  Before meals: Taking Insulin?: {Blank single:19197::"yes","no"}  Long acting insulin:  Short acting insulin: Blood Pressure Monitoring: {Blank single:19197::"not checking","rarely","daily","weekly","monthly","a few times a day","a few times a week","a few times a month"} Retinal Examination: {Blank single:19197::"Up to Date","Not up to Date"} Foot Exam: {Blank single:19197::"Up to Date","Not up to Date"} Diabetic Education: {Blank single:19197::"Completed","Not Completed"} Pneumovax: {Blank single:19197::"Up to Date","Not up to Date","unknown"} Influenza: {Blank single:19197::"Up to Date","Not up to Date","unknown"} Aspirin: {Blank single:19197::"yes","no"}  Relevant past medical, surgical, family and social history reviewed and updated as indicated. Interim medical history since our last visit reviewed. Allergies and medications reviewed and updated.  Review of Systems  Per HPI unless specifically indicated above     Objective:    There were no vitals taken for this visit.  Wt Readings from Last 3 Encounters:  01/27/19 199 lb (90.3 kg)  12/19/18 199 lb (90.3 kg)  11/03/18 201 lb (91.2 kg)    Physical Exam  Results for orders placed or performed during the hospital encounter of 01/27/19  Glucose, capillary  Result Value Ref  Range   Glucose-Capillary 326 (H) 70 - 99 mg/dL  Glucose, capillary  Result Value Ref Range   Glucose-Capillary 253 (H) 70 - 99 mg/dL      Assessment & Plan:   Problem List Items Addressed This Visit      Endocrine   DM (diabetes mellitus), type 2 with renal complications ([place]) - Primary       Follow up plan: No follow-ups on file.

## 2019-02-10 ENCOUNTER — Telehealth: Payer: Self-pay

## 2019-02-10 NOTE — Telephone Encounter (Signed)
Returned call to patient's spouse and scheduled an OV for 02/23/2019

## 2019-02-10 NOTE — Telephone Encounter (Signed)
Pt's spouse returned Rodena Piety call for scheduling. Would like a call back after 3:30 at: [phone]

## 2019-02-10 NOTE — Telephone Encounter (Signed)
Tried calling patient to schedule an OV in order for Dr. Wynetta Emery sign the parking placard. Per Dr. Wynetta Emery. Patient did not answer. Left VM to return call to the office.

## 2019-02-11 NOTE — Telephone Encounter (Signed)
Contacted Bonnie Ochoa and advised she has been doing well.  Cataract surgery did good, able to see better.  She has been sleeping well.  She received her new cord to be able to send her cardiac readings.  She has all her medications and verified them.  She states been watching what she eats and drinks.  Her sugars are more under control, doctor has changed her meds.  She has been staying awake during the day.  She has been more active.  She denies chest pain, headaches, dizziness or shortness of breath.  Will continue to visit for heart failure.   [place] [phone]

## 2019-02-16 ENCOUNTER — Ambulatory Visit (INDEPENDENT_AMBULATORY_CARE_PROVIDER_SITE_OTHER): Payer: Medicare Other | Admitting: Psychiatry

## 2019-02-16 ENCOUNTER — Encounter: Payer: Self-pay | Admitting: Psychiatry

## 2019-02-16 ENCOUNTER — Other Ambulatory Visit: Payer: Self-pay

## 2019-02-16 DIAGNOSIS — F3132 Bipolar disorder, current episode depressed, moderate: Secondary | ICD-10-CM | POA: Diagnosis not present

## 2019-02-16 DIAGNOSIS — F4312 Post-traumatic stress disorder, chronic: Secondary | ICD-10-CM

## 2019-02-16 DIAGNOSIS — F5105 Insomnia due to other mental disorder: Secondary | ICD-10-CM | POA: Insufficient documentation

## 2019-02-16 DIAGNOSIS — F1210 Cannabis abuse, uncomplicated: Secondary | ICD-10-CM | POA: Diagnosis not present

## 2019-02-16 DIAGNOSIS — F410 Panic disorder [episodic paroxysmal anxiety] without agoraphobia: Secondary | ICD-10-CM

## 2019-02-16 DIAGNOSIS — I200 Unstable angina: Secondary | ICD-10-CM

## 2019-02-16 MED ORDER — RISPERIDONE 1 MG PO TABS: 1.5000 mg | ORAL_TABLET | Freq: Every day | ORAL | 0 refills | Status: DC

## 2019-02-16 NOTE — Progress Notes (Signed)
Virtual Visit via Video Note  I connected with Bonnie Ochoa on 02/16/19 at  4:45 PM EDT by a video enabled telemedicine application and verified that I am speaking with the correct person using two identifiers.   I discussed the limitations of evaluation and management by telemedicine and the availability of in person appointments. The patient expressed understanding and agreed to proceed.   I discussed the assessment and treatment plan with the patient. The patient was provided an opportunity to ask questions and all were answered. The patient agreed with the plan and demonstrated an understanding of the instructions.   The patient was advised to call back or seek an in-person evaluation if the symptoms worsen or if the condition fails to improve as anticipated.   [facility] MD OP Progress Note  02/16/2019 5:24 PM Bonnie Ochoa  MRN:  [id number]  Chief Complaint:  Chief Complaint    Follow-up     HPI: Bonnie Ochoa is a 53 year old African-American female, engaged, lives in [place], has a history of bipolar disorder, PTSD, panic disorder, cannabis use disorder mild abuse, insomnia, OSA on CPAP, RLS, diabetes, cirrhosis of liver, chronic renal failure, congestive heart failure, recent cataract surgery, hypertension was evaluated by telemedicine today.  A video call was initiated however due to connection problem it had to be changed to a phone call.  Patient as well as partner participated in the evaluation.  Collateral information was obtained from Latricia-spouse.  Patient reports that she had cataract surgery recently.  She continues to have headaches from the same.  She reports recently she has noticed some mood lability, irritability more so due to her headaches.  She also reports hearing voices.  She however does not elaborate on them.  She reports she thought she may have hurt her spouse talk to her.  She reports sleep is improved.  She denies any suicidality, homicidality or perceptual  disturbances.  Per spouse patient may be hearing voices.  She wants to know what she can do to help support her spouse when she is having these problems.  Provided reassurance, provided education about how to cope with auditory hallucinations.  Discussed readjusting the dosage of her risperidone.  She agrees with plan.   Visit Diagnosis:    ICD-10-CM   1. Bipolar disorder, current episode depressed, moderate (HCC)  F31.32 risperiDONE (RISPERDAL) 1 MG tablet  2. Chronic post-traumatic stress disorder (PTSD)  F43.12   3. Panic disorder  F41.0   4. Cannabis use disorder, mild, abuse  F12.10   5. Insomnia due to mental condition  F51.05     Past Psychiatric History: I have reviewed past psychiatric history from my progress note on 10/16/2017.  Past Medical History:  Past Medical History:  Diagnosis Date  . Abdominal pain    a. 05/2018 HIDA scan wnl.  . ADHD   . Anemia   . Arthritis   . Asthma   . Bipolar 1 disorder ([place])   . Chest pain    a. Hx of cath in [place] - reportedly nl; b. 04/2018 MV: EF 22%, fixed dist ant septal, apical, and inferoapical defects - ? scar vs. attenuation. No ischemia.  . CHF (congestive heart failure) ([place])   . Cirrhosis of liver ([place])   . Coronary artery disease   . Depression   . Diabetes mellitus without complication ([place])   . Diverticulitis   . Dysrhythmia   . Heart murmur   . HFrEF (heart failure with reduced ejection fraction) ([place])    a.  06/2017 Echo: EF 20-25%, diff HK. Mildly dil LA; b. 05/2018 Echo: EF 25-30%, diff HK, Gr2 DD. Triv AI. Mod MR. Mildly reduced RV fxn. Mod-Sev TR. PASP 45-1mHg.  .Marland KitchenHypertension   . Hypothyroidism   . IBS (irritable bowel syndrome)   . Insomnia   . Migraines   . NICM (nonischemic cardiomyopathy) ([place]    a. EF prev 25%; b. 11/2014 s/p SJM Fortify Assura, single lead AICD (ser# [id number]; c. 06/2017 Echo: EF 20-25%; d. 05/2018 Echo: EF 25-30%, diff HK, Gr2 DD; e. 05/2018 Cath: Nl cors. LVEDP 281mg, PCWP 3239m. PA  65/40 (52). CO/CI 3.04/1.56.  . OSA (obstructive sleep apnea)    CPAP is broken  . PTSD (post-traumatic stress disorder)   . Restless leg syndrome   . Vertigo     Past Surgical History:  Procedure Laterality Date  . ABDOMINAL HYSTERECTOMY    . CARDIAC CATHETERIZATION    . CATARACT EXTRACTION W/PHACO Right 01/27/2019   Procedure: CATARACT EXTRACTION PHACO AND INTRAOCULAR LENS PLACEMENT ([place] BLUE RIGHT DIABETES;  Surgeon: PorBirder RobsonD;  [facility];  Laterality: Right;  Diabetes - insulin sleep apnea  . CORONARY ARTERY BYPASS GRAFT     Pt denies  . INSERT / REPLACE / [place]  ICD  . INSERTION OF ICD  2016   St Jude Single chamber ICD  . RIGHT/LEFT HEART CATH AND CORONARY ANGIOGRAPHY N/A 06/09/2018   Procedure: RIGHT/LEFT HEART CATH AND CORONARY ANGIOGRAPHY;  Surgeon: AriWellington HampshireD;  [facility];  Service: Cardiovascular;  Laterality: N/A;  . TONSILLECTOMY    . TUBAL LIGATION  1980    Family Psychiatric History: I have reviewed family psychiatric history from my progress note on 10/16/2017.  Family History:  Family History  Problem Relation Age of Onset  . Hypertension Mother   . Hyperlipidemia Mother   . Heart disease Mother   . Hypertension Sister   . Asthma Sister   . Heart disease Sister   . Diabetes Sister   . Cancer Sister   . Alzheimer's disease Maternal Grandfather   . Hyperlipidemia Brother   . Asthma Sister   . Hypertension Sister   . Diabetes Sister     Social History: I have reviewed social history from my progress note on 10/16/2017. Social History   Socioeconomic History  . Marital status: Married    Spouse name: Not on file  . Number of children: 2  . Years of education: Not on file  . Highest education level: High school graduate  Occupational History    Comment: disabled  Social Needs  . Financial resource strain: Not hard at all  . Food insecurity     Worry: Sometimes true    Inability: Sometimes true  . Transportation needs    Medical: Yes    Non-medical: Yes  Tobacco Use  . Smoking status: Former Smoker    Types: Cigarettes    Quit date: 1985    Years since quitting: 35.5  . Smokeless tobacco: Never Used  . Tobacco comment: quit over  20 years ago   Substance and Sexual Activity  . Alcohol use: Not Currently  . Drug use: Not Currently    Frequency: 1.0 times per week    Types: Marijuana  . Sexual activity: Yes    Birth control/protection: Surgical    Comment: one partner  Lifestyle  . Physical activity    Days per week: 0  days    Minutes per session: 0 min  . Stress: Very much  Relationships  . Social Herbalist on phone: Never    Gets together: Twice a week    Attends religious service: Never    Active member of club or organization: No    Attends meetings of clubs or organizations: Never    Relationship status: Married  Other Topics Concern  . Not on file  Social History Narrative   Volunteers occasionally     Allergies:  Allergies  Allergen Reactions  . Levothyroxine Rash    Metabolic Disorder Labs: Lab Results  Component Value Date   HGBA1C 8.3 (H) 11/03/2018   MPG 185.77 05/08/2018   MPG >398 07/03/2017   No results found for: PROLACTIN Lab Results  Component Value Date   CHOL 303 (H) 11/03/2018   TRIG 1,246 (HH) 11/03/2018   HDL 24 (L) 11/03/2018   CHOLHDL 5.4 05/09/2018   VLDL 53 (H) 05/09/2018   [place] Comment 11/03/2018   LDLCALC 84 05/09/2018   Lab Results  Component Value Date   TSH 1.651 10/24/2018   TSH 1.560 02/19/2018    Therapeutic Level Labs: No results found for: LITHIUM No results found for: VALPROATE No components found for:  CBMZ  Current Medications: Current Outpatient Medications  Medication Sig Dispense Refill  . atorvastatin (LIPITOR) 80 MG tablet Take 1 tablet (80 mg total) by mouth daily at 6 PM. 90 tablet 1  . blood glucose meter kit and  supplies KIT Dispense based on patient and insurance preference. Use up to four times daily as directed. (FOR ICD-9 250.00, 250.01). 1 each 0  . carvedilol (COREG) 12.5 MG tablet Take 1 tablet (12.5 mg total) by mouth 2 (two) times daily. 180 tablet 3  . cyclobenzaprine (FLEXERIL) 5 MG tablet Take 5 mg by mouth 3 (three) times daily as needed for muscle spasms.    Marland Kitchen dicyclomine (BENTYL) 20 MG tablet TAKE 1 TABLET (20 MG TOTAL) BY MOUTH 3 (THREE) TIMES DAILY AS NEEDED FOR SPASMS. 270 tablet 0  . digoxin (LANOXIN) 0.125 MG tablet Take 1 tablet (0.125 mg total) by mouth daily. 90 tablet 3  . Dulaglutide (TRULICITY) 8.28 MK/3.4JZ SOPN Inject 0.75 mg into the skin once a week. (Patient not taking: Reported on 01/21/2019) 4 pen 3  . DUREZOL 0.05 % EMUL     . empagliflozin (JARDIANCE) 25 MG TABS tablet Take 25 mg by mouth daily. (Patient not taking: Reported on 01/21/2019) 90 tablet 1  . famotidine (PEPCID) 40 MG tablet TAKE 1 TABLET BY MOUTH EVERY DAY IN THE EVENING 90 tablet 1  . furosemide (LASIX) 40 MG tablet Take 1 tablet (40 mg total) by mouth daily. 30 tablet 6  . ILEVRO 0.3 % ophthalmic suspension     . Insulin Glargine (LANTUS SOLOSTAR) 100 UNIT/ML Solostar Pen Inject 20 Units into the skin daily at 10 pm. 5 pen 12  . Insulin Pen Needle 32G X 6 MM MISC 1 each by Does not apply route daily. 100 each 12  . lamoTRIgine (LAMICTAL) 25 MG tablet Take 3 tablets (75 mg total) by mouth as directed. Take 25 mg in the AM and 50 mg PM 270 tablet 1  . metoCLOPramide (REGLAN) 10 MG tablet Take 1 tablet (10 mg total) by mouth 3 (three) times daily with meals. 90 tablet 1  . nitroGLYCERIN (NITROSTAT) 0.4 MG SL tablet Place 1 tablet (0.4 mg total) under the tongue every 5 (five) minutes as  needed for chest pain. 30 tablet 0  . pantoprazole (PROTONIX) 40 MG tablet TAKE 1 TABLET BY MOUTH EVERY DAY 90 tablet 3  . PARoxetine (PAXIL) 30 MG tablet Take 1 tablet (30 mg total) by mouth daily. 90 tablet 1  . potassium  chloride SA (K-DUR,KLOR-CON) 20 MEQ tablet Take 2 tablets (40 mEq total) by mouth 2 (two) times daily. (Patient not taking: Reported on 01/21/2019) 360 tablet 1  . risperiDONE (RISPERDAL) 1 MG tablet Take 1.5 tablets (1.5 mg total) by mouth at bedtime. 135 tablet 0  . sacubitril-valsartan (ENTRESTO) 49-51 MG Take 1 tablet by mouth 2 (two) times daily. 180 tablet 1  . spironolactone (ALDACTONE) 25 MG tablet Take 25 mg by mouth daily.    . sucralfate (CARAFATE) 1 g tablet TAKE 1 TABLET (1 G TOTAL) BY MOUTH 4 (FOUR) TIMES DAILY. 360 tablet 3  . zolpidem (AMBIEN) 5 MG tablet Take 1 tablet (5 mg total) by mouth at bedtime as needed for sleep. 30 tablet 1   No current facility-administered medications for this visit.      Musculoskeletal: Strength & Muscle Tone: UTA Gait & Station: UTA Patient leans: N/A  Psychiatric Specialty Exam: Review of Systems  Psychiatric/Behavioral: Positive for hallucinations.  All other systems reviewed and are negative.   There were no vitals taken for this visit.There is no height or weight on file to calculate BMI.  General Appearance: UTA  Eye Contact:  UTA  Speech:  Clear and Coherent  Volume:  Normal  Mood:  Anxious  Affect:  Appropriate  Thought Process:  Goal Directed and Descriptions of Associations: Intact  Orientation:  Full (Time, Place, and Person)  Thought Content: Hallucinations: Auditory on and off   Suicidal Thoughts:  No  Homicidal Thoughts:  No  Memory:  Immediate;   Fair Recent;   Fair Remote;   Fair  Judgement:  Fair  Insight:  Fair  Psychomotor Activity:  Normal  Concentration:  Concentration: Fair and Attention Span: Fair  Recall:  [employer] of Knowledge: Fair  Language: Fair  Akathisia:  No  Handed:  Right  AIMS (if indicated): Denies tremors, rigidity  Assets:  Communication Skills Desire for Improvement Social Support  ADL's:  Intact  Cognition: WNL  Sleep:  Fair   Screenings: GAD-7     Office Visit from 12/28/2016  in [facility]  Total GAD-7 Score  21    PHQ2-9     Patient Outreach Telephone from 06/16/2018 in [facility] Office Visit from 05/08/2018 in [facility] Office Visit from 04/23/2018 in [facility] from 02/19/2018 in [facility] Office Visit from 12/24/2017 in [facility]  PHQ-2 Total Score  0  1  0  2  0  PHQ-9 Total Score  -  -  -  7  -       Assessment and Plan: Bonnie Ochoa is a 53 year old African-American female who has a history of bipolar disorder, PTSD, panic attacks, cannabis use disorder, congestive heart failure, liver disease, chronic renal failure, diabetes melitis, OSA, hypertension, RLS was evaluated by telemedicine today.  Patient is currently struggling with mood lability and psychosis.  Patient will benefit from the following medication changes.  Plan For bipolar disorder- unstable Increase risperidone to 1.5 mg p.o. nightly Lamotrigine 75 mg p.o. daily in divided dosage Paxil 30 mg p.o. daily  For PTSD-stable Paxil as prescribed.  For panic disorder-improving Paxil as prescribed  For insomnia-improving Ambien 5 mg p.o. nightly as needed Continue CPAP for OSA  Cannabis use disorder-mild abuse-improving Provided substance abuse counseling.  She is cutting back.  For  alcohol use disorder in remission She quit in 2006, occasional use only now.  Collateral information was obtained from Latricia-spouse as summarized above.  Patient was referred for CBT previously.  Follow-up in clinic in 1 month or sooner if needed.  August 18 at 2:15 PM  I have spent atleast 15 minutes non face to face with patient today. More than 50 % of the time was spent for psychoeducation and supportive psychotherapy and care coordination.  This note was generated in part or whole with voice recognition software.  Voice recognition is usually quite accurate but there are transcription errors that can and very often do occur. I apologize for any typographical errors that were not detected and corrected.        [name], MD 02/16/2019, 5:24 PM

## 2019-02-17 ENCOUNTER — Other Ambulatory Visit: Payer: Self-pay | Admitting: Psychiatry

## 2019-02-17 DIAGNOSIS — F4312 Post-traumatic stress disorder, chronic: Secondary | ICD-10-CM

## 2019-02-17 NOTE — Progress Notes (Signed)
No ICM remote transmissions received since 09/2018 and unable to reach patient for follow up.  Patient disenrolled from monthly ICM follow up.  Device clinic will continue to follow every 91 days per protocol.

## 2019-02-22 NOTE — Progress Notes (Signed)
BP 129/83   Pulse 85   Temp 98.3 F (36.8 C) (Oral)   Ht 5\' 5"  (1.651 m)   Wt 198 lb (89.8 kg)   SpO2 97%   BMI 32.95 kg/m    Subjective:    Patient ID: Bonnie Ochoa, female    DOB: [birth year], 53 y.o.   MRN: [id number]  HPI: Bonnie Ochoa is a 53 y.o. female  Chief Complaint  Patient presents with  . Diabetes   DIABETES Hypoglycemic episodes:no Polydipsia/polyuria: yes Visual disturbance: no Chest pain: no Paresthesias: no Glucose Monitoring: yes  Accucheck frequency: occasionally, 150s Taking Insulin?: no Blood Pressure Monitoring: not checking Retinal Examination: Up to Date Foot Exam: Up to Date Diabetic Education: Completed Pneumovax: Up to Date Influenza: Up to Date Aspirin: no  Relevant past medical, surgical, family and social history reviewed and updated as indicated. Interim medical history since our last visit reviewed. Allergies and medications reviewed and updated.  Review of Systems  Constitutional: Negative.   Respiratory: Negative.   Cardiovascular: Negative.   Gastrointestinal: Negative.   Psychiatric/Behavioral: Negative.     Per HPI unless specifically indicated above     Objective:    BP 129/83   Pulse 85   Temp 98.3 F (36.8 C) (Oral)   Ht 5\' 5"  (1.651 m)   Wt 198 lb (89.8 kg)   SpO2 97%   BMI 32.95 kg/m   Wt Readings from Last 3 Encounters:  02/23/19 198 lb (89.8 kg)  01/27/19 199 lb (90.3 kg)  12/19/18 199 lb (90.3 kg)    Physical Exam Vitals signs and nursing note reviewed.  Constitutional:      General: She is not in acute distress.    Appearance: Normal appearance. She is not ill-appearing, toxic-appearing or diaphoretic.  HENT:     Head: Normocephalic and atraumatic.     Right Ear: External ear normal.     Left Ear: External ear normal.     Nose: Nose normal.     Mouth/Throat:     Mouth: Mucous membranes are moist.     Pharynx: Oropharynx is clear.  Eyes:     General: No scleral icterus.       Right eye:  No discharge.        Left eye: No discharge.     Extraocular Movements: Extraocular movements intact.     Conjunctiva/sclera: Conjunctivae normal.     Pupils: Pupils are equal, round, and reactive to light.  Neck:     Musculoskeletal: Normal range of motion and neck supple.  Cardiovascular:     Rate and Rhythm: Normal rate and regular rhythm.     Pulses: Normal pulses.     Heart sounds: Normal heart sounds. No murmur. No friction rub. No gallop.   Pulmonary:     Effort: Pulmonary effort is normal. No respiratory distress.     Breath sounds: Normal breath sounds. No stridor. No wheezing, rhonchi or rales.  Chest:     Chest wall: No tenderness.  Musculoskeletal: Normal range of motion.  Skin:    General: Skin is warm and dry.     Capillary Refill: Capillary refill takes less than 2 seconds.     Coloration: Skin is not jaundiced or pale.     Findings: No bruising, erythema, lesion or rash.  Neurological:     General: No focal deficit present.     Mental Status: She is alert and oriented to person, place, and time. Mental status is at baseline.  Psychiatric:  Mood and Affect: Mood normal.        Behavior: Behavior normal.        Thought Content: Thought content normal.        Judgment: Judgment normal.     Results for orders placed or performed during the hospital encounter of 01/27/19  Glucose, capillary  Result Value Ref Range   Glucose-Capillary 326 (H) 70 - 99 mg/dL  Glucose, capillary  Result Value Ref Range   Glucose-Capillary 253 (H) 70 - 99 mg/dL      Assessment & Plan:   Problem List Items Addressed This Visit      Endocrine   DM (diabetes mellitus), type 2 with renal complications (HCC) - Primary    Has not been taking her trulicity or her jardiance. Only taking her lantus. Sugars have gone up from 8.3 to 13.9. Will get her back on her trulicity and jardiance. Would likely benefit from switching from trulicity and lantus to xultophy- but I want to make sure  she tolerates the trulicity first. Will get [facility] pharmacy to give her a call and see if she can help with medication adherence. Recheck 2-3 weeks. Call with any concerns.       Relevant Medications   Dulaglutide (TRULICITY) 0.75 MG/0.5ML SOPN   empagliflozin (JARDIANCE) 25 MG TABS tablet   Other Relevant Orders   Bayer DCA Hb A1c Waived   Basic metabolic panel   Referral to Chronic Care Management Services    Other Visit Diagnoses    Flank pain       Likely due to poorly controlled sugars. Will check urine. Await results. Call with any concerns.    Relevant Orders   UA/M w/rflx Culture, Routine       Follow up plan: Return for 2-3 weeks, follow up sugars.

## 2019-02-23 ENCOUNTER — Encounter: Payer: Self-pay | Admitting: Family Medicine

## 2019-02-23 ENCOUNTER — Other Ambulatory Visit: Payer: Self-pay

## 2019-02-23 ENCOUNTER — Ambulatory Visit (INDEPENDENT_AMBULATORY_CARE_PROVIDER_SITE_OTHER): Payer: Medicare Other | Admitting: Family Medicine

## 2019-02-23 VITALS — BP 129/83 | HR 85 | Temp 98.3°F | Ht 65.0 in | Wt 198.0 lb

## 2019-02-23 DIAGNOSIS — I200 Unstable angina: Secondary | ICD-10-CM | POA: Diagnosis not present

## 2019-02-23 DIAGNOSIS — R109 Unspecified abdominal pain: Secondary | ICD-10-CM

## 2019-02-23 DIAGNOSIS — N183 Chronic kidney disease, stage 3 (moderate): Secondary | ICD-10-CM

## 2019-02-23 DIAGNOSIS — Z794 Long term (current) use of insulin: Secondary | ICD-10-CM | POA: Diagnosis not present

## 2019-02-23 DIAGNOSIS — E1122 Type 2 diabetes mellitus with diabetic chronic kidney disease: Secondary | ICD-10-CM

## 2019-02-23 LAB — UA/M W/RFLX CULTURE, ROUTINE
Bilirubin, UA: NEGATIVE
Ketones, UA: NEGATIVE
Leukocytes,UA: NEGATIVE
Nitrite, UA: NEGATIVE
Protein,UA: NEGATIVE
RBC, UA: NEGATIVE
Specific Gravity, UA: 1.01 (ref 1.005–1.030)
Urobilinogen, Ur: 0.2 mg/dL (ref 0.2–1.0)
pH, UA: 6.5 (ref 5.0–7.5)

## 2019-02-23 LAB — BAYER DCA HB A1C WAIVED: HB A1C (BAYER DCA - WAIVED): 13.9 % — ABNORMAL HIGH (ref <7.0)

## 2019-02-23 MED ORDER — EMPAGLIFLOZIN 25 MG PO TABS: 25.0000 mg | ORAL_TABLET | Freq: Every day | ORAL | 1 refills | Status: DC

## 2019-02-23 MED ORDER — TRULICITY 0.75 MG/0.5ML ~~LOC~~ SOAJ: 0.7500 mg | SUBCUTANEOUS | 3 refills | Status: DC

## 2019-02-23 NOTE — Assessment & Plan Note (Signed)
Has not been taking her trulicity or her jardiance. Only taking her lantus. Sugars have gone up from 8.3 to 13.9. Will get her back on her trulicity and jardiance. Would likely benefit from switching from trulicity and lantus to xultophy- but I want to make sure she tolerates the trulicity first. Will get [facility] pharmacy to give her a call and see if she can help with medication adherence. Recheck 2-3 weeks. Call with any concerns.

## 2019-02-24 ENCOUNTER — Telehealth (HOSPITAL_COMMUNITY): Payer: Self-pay

## 2019-02-24 LAB — BASIC METABOLIC PANEL
BUN/Creatinine Ratio: 16 (ref 9–23)
BUN: 18 mg/dL (ref 6–24)
CO2: 23 mmol/L (ref 20–29)
Calcium: 9.8 mg/dL (ref 8.7–10.2)
Chloride: 95 mmol/L — ABNORMAL LOW (ref 96–106)
Creatinine, Ser: 1.11 mg/dL — ABNORMAL HIGH (ref 0.57–1.00)
GFR calc Af Amer: 65 mL/min/{1.73_m2} (ref >59)
GFR calc non Af Amer: 56 mL/min/{1.73_m2} — ABNORMAL LOW (ref >59)
Glucose: 475 mg/dL — ABNORMAL HIGH (ref 65–99)
Potassium: 4 mmol/L (ref 3.5–5.2)
Sodium: 136 mmol/L (ref 134–144)

## 2019-02-24 NOTE — Telephone Encounter (Signed)
Attempted to contact Bonnie Ochoa today, left message.   [place] [phone]

## 2019-02-25 ENCOUNTER — Ambulatory Visit: Payer: Self-pay | Admitting: Family Medicine

## 2019-02-25 ENCOUNTER — Other Ambulatory Visit: Payer: Self-pay

## 2019-02-25 ENCOUNTER — Ambulatory Visit: Payer: Self-pay

## 2019-02-25 ENCOUNTER — Ambulatory Visit: Payer: Medicare Other | Admitting: Licensed Clinical Social Worker

## 2019-03-04 ENCOUNTER — Other Ambulatory Visit: Payer: Self-pay

## 2019-03-04 ENCOUNTER — Ambulatory Visit (INDEPENDENT_AMBULATORY_CARE_PROVIDER_SITE_OTHER): Payer: Medicare Other | Admitting: Gastroenterology

## 2019-03-04 ENCOUNTER — Encounter: Payer: Self-pay | Admitting: Anesthesiology

## 2019-03-04 ENCOUNTER — Encounter: Payer: Self-pay | Admitting: Gastroenterology

## 2019-03-04 VITALS — BP 110/77 | HR 94 | Temp 98.2°F | Ht 65.0 in | Wt 193.6 lb

## 2019-03-04 DIAGNOSIS — K7460 Unspecified cirrhosis of liver: Secondary | ICD-10-CM

## 2019-03-04 DIAGNOSIS — Z1211 Encounter for screening for malignant neoplasm of colon: Secondary | ICD-10-CM

## 2019-03-04 NOTE — Progress Notes (Signed)
[name], MD [address]  [place]  [place], [place] [address]  Main: [phone]  Fax: [phone]   Primary Care Physician: Valerie Roys, DO   Chief Complaint  Patient presents with  . Follow-up    cirrhosis of the liver    HPI: Bonnie Ochoa is a 53 y.o. female with history of cirrhosis here for follow-up.  The patient denies abdominal or flank pain, anorexia, nausea or vomiting, dysphagia, change in bowel habits or black or bloody stools or weight loss.  Patient denies any episodes of confusion or bleeding.  Cirrhosis due to low EF of 25% requiring AICD.  Following with cardiology.  Previously was scheduled for variceal screening and screening colonoscopy but that was rescheduled due to acute CHF admission around that time.    Current Outpatient Medications  Medication Sig Dispense Refill  . atorvastatin (LIPITOR) 80 MG tablet Take 1 tablet (80 mg total) by mouth daily at 6 PM. 90 tablet 1  . blood glucose meter kit and supplies KIT Dispense based on patient and insurance preference. Use up to four times daily as directed. (FOR ICD-9 250.00, 250.01). 1 each 0  . carvedilol (COREG) 12.5 MG tablet Take 1 tablet (12.5 mg total) by mouth 2 (two) times daily. 180 tablet 3  . cyclobenzaprine (FLEXERIL) 5 MG tablet Take 5 mg by mouth 3 (three) times daily as needed for muscle spasms.    Marland Kitchen dicyclomine (BENTYL) 20 MG tablet TAKE 1 TABLET (20 MG TOTAL) BY MOUTH 3 (THREE) TIMES DAILY AS NEEDED FOR SPASMS. 270 tablet 0  . digoxin (LANOXIN) 0.125 MG tablet Take 1 tablet (0.125 mg total) by mouth daily. 90 tablet 3  . Dulaglutide (TRULICITY) 8.50 YD/7.4JO SOPN Inject 0.75 mg into the skin once a week. 4 pen 3  . DUREZOL 0.05 % EMUL     . empagliflozin (JARDIANCE) 25 MG TABS tablet Take 25 mg by mouth daily. 90 tablet 1  . famotidine (PEPCID) 40 MG tablet TAKE 1 TABLET BY MOUTH EVERY DAY IN THE EVENING 90 tablet 1  . furosemide (LASIX) 40 MG tablet Take 1 tablet  (40 mg total) by mouth daily. 30 tablet 6  . ILEVRO 0.3 % ophthalmic suspension     . Insulin Glargine (LANTUS SOLOSTAR) 100 UNIT/ML Solostar Pen Inject 20 Units into the skin daily at 10 pm. 5 pen 12  . Insulin Pen Needle 32G X 6 MM MISC 1 each by Does not apply route daily. 100 each 12  . lamoTRIgine (LAMICTAL) 25 MG tablet Take 3 tablets (75 mg total) by mouth as directed. Take 25 mg in the AM and 50 mg PM 270 tablet 1  . metoCLOPramide (REGLAN) 10 MG tablet Take 1 tablet (10 mg total) by mouth 3 (three) times daily with meals. 90 tablet 1  . nitroGLYCERIN (NITROSTAT) 0.4 MG SL tablet Place 1 tablet (0.4 mg total) under the tongue every 5 (five) minutes as needed for chest pain. 30 tablet 0  . pantoprazole (PROTONIX) 40 MG tablet TAKE 1 TABLET BY MOUTH EVERY DAY 90 tablet 3  . PARoxetine (PAXIL) 30 MG tablet Take 1 tablet (30 mg total) by mouth daily. 90 tablet 1  . potassium chloride SA (K-DUR,KLOR-CON) 20 MEQ tablet Take 2 tablets (40 mEq total) by mouth 2 (two) times daily. 360 tablet 1  . risperiDONE (RISPERDAL) 1 MG tablet Take 1.5 tablets (1.5 mg total) by mouth at bedtime. 135 tablet 0  . sacubitril-valsartan (ENTRESTO) 49-51 MG Take  1 tablet by mouth 2 (two) times daily. 180 tablet 1  . spironolactone (ALDACTONE) 25 MG tablet Take 25 mg by mouth daily.    . sucralfate (CARAFATE) 1 g tablet TAKE 1 TABLET (1 G TOTAL) BY MOUTH 4 (FOUR) TIMES DAILY. 360 tablet 3  . zolpidem (AMBIEN) 5 MG tablet Take 1 tablet (5 mg total) by mouth at bedtime as needed for sleep. 30 tablet 1   No current facility-administered medications for this visit.     Allergies as of 03/04/2019 - Review Complete 03/04/2019  Allergen Reaction Noted  . Levothyroxine Rash 07/02/2017    ROS:  General: Negative for anorexia, weight loss, fever, chills, fatigue, weakness. ENT: Negative for hoarseness, difficulty swallowing , nasal congestion. CV: Negative for chest pain, angina, palpitations, dyspnea on exertion,  peripheral edema.  Respiratory: Negative for dyspnea at rest, dyspnea on exertion, cough, sputum, wheezing.  GI: See history of present illness. GU:  Negative for dysuria, hematuria, urinary incontinence, urinary frequency, nocturnal urination.  Endo: Negative for unusual weight change.    Physical Examination:   BP 110/77   Pulse 94   Temp 98.2 F (36.8 C) (Oral)   Ht 5' 5"  (1.651 m)   Wt 193 lb 9.6 oz (87.8 kg)   BMI 32.22 kg/m   General: Well-nourished, well-developed in no acute distress.  Eyes: No icterus. Conjunctivae pink. Mouth: Oropharyngeal mucosa moist and pink , no lesions erythema or exudate. Neck: Supple, Trachea midline Abdomen: Bowel sounds are normal, nontender, nondistended, no hepatosplenomegaly or masses, no abdominal bruits or hernia , no rebound or guarding.   Extremities: No lower extremity edema. No clubbing or deformities. Neuro: Alert and oriented x 3.  Grossly intact. Skin: Warm and dry, no jaundice.   Psych: Alert and cooperative, normal mood and affect.   Labs: CMP     Component Value Date/Time   NA 136 02/23/2019 1045   K 4.0 02/23/2019 1045   CL 95 (L) 02/23/2019 1045   CO2 23 02/23/2019 1045   GLUCOSE 475 (H) 02/23/2019 1045   GLUCOSE 131 (H) 10/25/2018 0925   BUN 18 02/23/2019 1045   CREATININE 1.11 (H) 02/23/2019 1045   CALCIUM 9.8 02/23/2019 1045   PROT 7.3 11/03/2018 1031   ALBUMIN 4.0 11/03/2018 1031   AST 20 11/03/2018 1031   ALT 10 11/03/2018 1031   ALKPHOS 104 11/03/2018 1031   BILITOT <0.2 11/03/2018 1031   GFRNONAA 56 (L) 02/23/2019 1045   GFRAA 65 02/23/2019 1045   Lab Results  Component Value Date   WBC 5.6 10/24/2018   HGB 9.6 (L) 10/24/2018   HCT 30.1 (L) 10/24/2018   MCV 92.6 10/24/2018   PLT 202 10/24/2018    Imaging Studies: No results found.  Assessment and Plan:   Bonnie Ochoa is a 53 y.o. y/o female here for follow-up of cirrhosis due to low EF  Repeat labs to recalculate meld Meld 12 on March 2020  labs Right upper quadrant ultrasound ordered in March 2020 but patient did not get it done  Patient was asked to get this done  Patient willing to schedule screening colonoscopy and variceal screening EGD as well  I see that she started the hepatitis B vaccine series with primary care provider.  I have asked her to make sure she completes this series at her PCP office  No signs of fluid overload, no confusion, no bleeding.  No signs of decompensation Continue to avoid hepatotoxic drugs Continue sodium restriction to less than 2 g  a day and continue follow-up with cardiology  I have discussed alternative options, risks & benefits,  which include, but are not limited to, bleeding, infection, perforation,respiratory complication & drug reaction.  The patient agrees with this plan & written consent will be obtained.     Dr [name]

## 2019-03-04 NOTE — Addendum Note (Signed)
Addended by: [name] on: 03/04/2019 04:09 PM   Modules accepted: Level of Service

## 2019-03-04 NOTE — Anesthesia Preprocedure Evaluation (Deleted)
Anesthesia Evaluation    History of Anesthesia Complications Negative for: history of anesthetic complications  Airway Mallampati: IV   Neck ROM: Full    Dental  (+)    Pulmonary asthma , sleep apnea and Continuous Positive Airway Pressure Ventilation , former smoker,           Cardiovascular hypertension, + CAD and +CHF  + dysrhythmias   NICM, HFrEF (20%), murmur  ECG 10/25/18: Sinus tachycardia, LVH, no STEMI.   Neuro/Psych  Headaches, PSYCHIATRIC DISORDERS (PTSD, ADHD) Anxiety Depression Hx EtOH abuse, vertigo, RLS    GI/Hepatic negative GI ROS, (+) Cirrhosis       ,   Endo/Other  diabetes, Poorly Controlled, Type 2, Insulin DependentHypothyroidism   Renal/GU Renal disease (stage II CKD)     Musculoskeletal  (+) Arthritis ,   Abdominal   Peds  Hematology  (+) Blood dyscrasia, anemia ,   Anesthesia Other Findings   Reproductive/Obstetrics                             Anesthesia Physical  Anesthesia Plan  ASA: III  Anesthesia Plan: MAC   Post-op Pain Management:    Induction: Intravenous  PONV Risk Score and Plan: 2 and TIVA and Midazolam  Airway Management Planned: Natural Airway  Additional Equipment:   Intra-op Plan:   Post-operative Plan:   Informed Consent:   Plan Discussed with:   Anesthesia Plan Comments:         Anesthesia Quick Evaluation

## 2019-03-05 ENCOUNTER — Other Ambulatory Visit
Admission: RE | Admit: 2019-03-05 | Discharge: 2019-03-05 | Disposition: A | Discharge status: Home / Self Care | Payer: Medicare Other | Source: Ambulatory Visit | Attending: Gastroenterology | Admitting: Gastroenterology

## 2019-03-05 ENCOUNTER — Telehealth: Payer: Self-pay

## 2019-03-05 DIAGNOSIS — Z20828 Contact with and (suspected) exposure to other viral communicable diseases: Secondary | ICD-10-CM | POA: Insufficient documentation

## 2019-03-05 DIAGNOSIS — Z01812 Encounter for preprocedural laboratory examination: Secondary | ICD-10-CM | POA: Insufficient documentation

## 2019-03-05 LAB — SARS CORONAVIRUS 2 (TAT 6-24 HRS): SARS Coronavirus 2: NEGATIVE

## 2019-03-05 LAB — COMPREHENSIVE METABOLIC PANEL
ALT: 18 IU/L (ref 0–32)
AST: 15 IU/L (ref 0–40)
Albumin/Globulin Ratio: 1.2 (ref 1.2–2.2)
Albumin: 4.4 g/dL (ref 3.8–4.9)
Alkaline Phosphatase: 121 IU/L — ABNORMAL HIGH (ref 39–117)
BUN/Creatinine Ratio: 12 (ref 9–23)
BUN: 14 mg/dL (ref 6–24)
Bilirubin Total: 1.3 mg/dL — ABNORMAL HIGH (ref 0.0–1.2)
CO2: 23 mmol/L (ref 20–29)
Calcium: 9.9 mg/dL (ref 8.7–10.2)
Chloride: 93 mmol/L — ABNORMAL LOW (ref 96–106)
Creatinine, Ser: 1.18 mg/dL — ABNORMAL HIGH (ref 0.57–1.00)
GFR calc Af Amer: 60 mL/min/{1.73_m2} (ref >59)
GFR calc non Af Amer: 52 mL/min/{1.73_m2} — ABNORMAL LOW (ref >59)
Globulin, Total: 3.6 g/dL (ref 1.5–4.5)
Glucose: 355 mg/dL — ABNORMAL HIGH (ref 65–99)
Potassium: 3.4 mmol/L — ABNORMAL LOW (ref 3.5–5.2)
Sodium: 137 mmol/L (ref 134–144)
Total Protein: 8 g/dL (ref 6.0–8.5)

## 2019-03-05 LAB — PROTIME-INR
INR: 1 (ref 0.8–1.2)
Prothrombin Time: 10.5 s (ref 9.1–12.0)

## 2019-03-05 NOTE — Telephone Encounter (Signed)
Patient has been advised to increase potassium rich foods such as bananas.  Her sister Bonnie Ochoa said she would let PCP know that her sister needs her BMP rechecked when she goes into the office on the 28th.  Also asked her to follow up with her kidney doctor, and if she doesn't have one to ask for a referral from her PCP.  Thanks [employer]

## 2019-03-05 NOTE — Telephone Encounter (Signed)
-----   Message from Virgel Manifold, MD sent at 03/05/2019 12:46 PM EDT ----- Sharyn Lull please let patient know, her potassium is slightly low.  She should eat bananas and other foods with potassium in it.  Please order repeat BMP in 1 week to recheck her potassium.  She should follow-up with the kidney doctor for her kidney function and obtain referral from her primary care provider for this that she has not already seeing one.  -- MELD Na 9

## 2019-03-06 ENCOUNTER — Other Ambulatory Visit: Payer: Self-pay

## 2019-03-06 ENCOUNTER — Telehealth: Payer: Self-pay

## 2019-03-06 ENCOUNTER — Ambulatory Visit
Admission: RE | Admit: 2019-03-06 | Discharge: 2019-03-06 | Disposition: A | Discharge status: Home / Self Care | Payer: Medicare Other | Source: Ambulatory Visit | Attending: Gastroenterology | Admitting: Gastroenterology

## 2019-03-06 ENCOUNTER — Telehealth: Payer: Self-pay | Admitting: Internal Medicine

## 2019-03-06 DIAGNOSIS — K7460 Unspecified cirrhosis of liver: Secondary | ICD-10-CM

## 2019-03-06 DIAGNOSIS — K7469 Other cirrhosis of liver: Secondary | ICD-10-CM

## 2019-03-06 DIAGNOSIS — Z1211 Encounter for screening for malignant neoplasm of colon: Secondary | ICD-10-CM

## 2019-03-06 NOTE — Telephone Encounter (Signed)
LMOVM for pt to return call 

## 2019-03-06 NOTE — Telephone Encounter (Signed)
Disregard preop form.

## 2019-03-06 NOTE — Telephone Encounter (Signed)
°  Bonnie Ochoa from [place] calling about AICD form for Colonoscopy and EGD on Monday. They need to be spoken with ASAP at [phone]

## 2019-03-06 NOTE — Telephone Encounter (Signed)
Returning call. Caryl Pina unavailable per clinic staff. DC contact # left for return call.

## 2019-03-06 NOTE — Telephone Encounter (Signed)
Patient at appointment. Spoke with sister Abigail Butts per DPR and asked that patient call office regarding remote monitor and # to device clinic provided. Monitor not transmitting  to [employer].

## 2019-03-06 NOTE — Telephone Encounter (Signed)
Ms. Wisor cannot be done here ([facility])since she has a Insurance account manager (ICD) and there is the potential for cautery to be used during this procedure. If the ICD has to be turned off during the use of cautery then it will have to be interrogated by the device rep and we do not have them available here in [place].   Patients sister Abigail Butts, has been contacted to let her know of this.  New procedure date is 03/11/19 at [facility].  Thanks [employer]

## 2019-03-09 ENCOUNTER — Ambulatory Visit: Admission: RE | Admit: 2019-03-09 | Payer: Medicare Other | Source: Home / Self Care | Admitting: Gastroenterology

## 2019-03-09 SURGERY — COLONOSCOPY WITH PROPOFOL
Anesthesia: General

## 2019-03-10 ENCOUNTER — Encounter: Payer: Self-pay | Admitting: Family Medicine

## 2019-03-10 ENCOUNTER — Ambulatory Visit (INDEPENDENT_AMBULATORY_CARE_PROVIDER_SITE_OTHER): Payer: Medicare Other | Admitting: Family Medicine

## 2019-03-10 ENCOUNTER — Ambulatory Visit (INDEPENDENT_AMBULATORY_CARE_PROVIDER_SITE_OTHER): Payer: Medicare Other | Admitting: Pharmacist

## 2019-03-10 ENCOUNTER — Other Ambulatory Visit: Payer: Self-pay

## 2019-03-10 VITALS — BP 124/84 | HR 81 | Temp 98.3°F | Ht 65.0 in | Wt 194.0 lb

## 2019-03-10 DIAGNOSIS — Z794 Long term (current) use of insulin: Secondary | ICD-10-CM

## 2019-03-10 DIAGNOSIS — E1122 Type 2 diabetes mellitus with diabetic chronic kidney disease: Secondary | ICD-10-CM | POA: Diagnosis not present

## 2019-03-10 DIAGNOSIS — I200 Unstable angina: Secondary | ICD-10-CM

## 2019-03-10 DIAGNOSIS — N183 Chronic kidney disease, stage 3 unspecified: Secondary | ICD-10-CM

## 2019-03-10 DIAGNOSIS — R300 Dysuria: Secondary | ICD-10-CM | POA: Diagnosis not present

## 2019-03-10 LAB — UA/M W/RFLX CULTURE, ROUTINE
Bilirubin, UA: NEGATIVE
Glucose, UA: NEGATIVE
Ketones, UA: NEGATIVE
Leukocytes,UA: NEGATIVE
Nitrite, UA: NEGATIVE
Protein,UA: NEGATIVE
RBC, UA: NEGATIVE
Specific Gravity, UA: <1.005 — ABNORMAL LOW (ref 1.005–1.030)
Urobilinogen, Ur: 1 mg/dL (ref 0.2–1.0)
pH, UA: 6 (ref 5.0–7.5)

## 2019-03-10 MED ORDER — TRULICITY 1.5 MG/0.5ML ~~LOC~~ SOAJ: 1.5000 mg | SUBCUTANEOUS | 3 refills | Status: DC

## 2019-03-10 NOTE — Progress Notes (Signed)
BP 124/84   Pulse 81   Temp 98.3 F (36.8 C) (Oral)   Ht 5\' 5"  (1.651 m)   Wt 194 lb (88 kg)   SpO2 93%   BMI 32.28 kg/m    Subjective:    Patient ID: Bonnie Ochoa, female    DOB: 02-02-1966, 53 y.o.   MRN: [id number]  HPI: Bonnie Ochoa is a 53 y.o. female  Chief Complaint  Patient presents with  . Diabetes    f/u   DIABETES Hypoglycemic episodes:no Polydipsia/polyuria: no Visual disturbance: no Chest pain: no Paresthesias: no Glucose Monitoring: yes  Accucheck frequency: occasionally  Post prandial: 200 after eating Taking Insulin?: yes  Long acting insulin: lantus 20 units Blood Pressure Monitoring: not checking Retinal Examination: Up to date Foot Exam: Up to Date Diabetic Education: Not Completed Pneumovax: Up to Date Influenza: Up to Date Aspirin: no  URINARY SYMPTOMS Duration: 2 weeks Dysuria: burning Urinary frequency: yes Urgency: yes Small volume voids: no Symptom severity: moderate Urinary incontinence: no Foul odor: no Hematuria: no Abdominal pain: no Back pain: no Suprapubic pain/pressure: no Flank pain: no Fever:  no Vomiting: no Relief with cranberry juice: no Relief with pyridium: no Status: stable  Relevant past medical, surgical, family and social history reviewed and updated as indicated. Interim medical history since our last visit reviewed. Allergies and medications reviewed and updated.  Review of Systems  Constitutional: Negative.   Respiratory: Negative.   Cardiovascular: Negative.   Gastrointestinal: Negative.   Genitourinary: Positive for dysuria, frequency and urgency. Negative for decreased urine volume, difficulty urinating, dyspareunia, enuresis, flank pain, genital sores, hematuria, menstrual problem, pelvic pain, vaginal bleeding, vaginal discharge and vaginal pain.  Psychiatric/Behavioral: Negative.     Per HPI unless specifically indicated above     Objective:    BP 124/84   Pulse 81   Temp 98.3 F  (36.8 C) (Oral)   Ht 5\' 5"  (1.651 m)   Wt 194 lb (88 kg)   SpO2 93%   BMI 32.28 kg/m   Wt Readings from Last 3 Encounters:  03/10/19 194 lb (88 kg)  03/04/19 193 lb 9.6 oz (87.8 kg)  02/23/19 198 lb (89.8 kg)    Physical Exam Vitals signs and nursing note reviewed.  Constitutional:      General: She is not in acute distress.    Appearance: Normal appearance. She is not ill-appearing, toxic-appearing or diaphoretic.  HENT:     Head: Normocephalic and atraumatic.     Right Ear: External ear normal.     Left Ear: External ear normal.     Nose: Nose normal.     Mouth/Throat:     Mouth: Mucous membranes are moist.     Pharynx: Oropharynx is clear.  Eyes:     General: No scleral icterus.       Right eye: No discharge.        Left eye: No discharge.     Extraocular Movements: Extraocular movements intact.     Conjunctiva/sclera: Conjunctivae normal.     Pupils: Pupils are equal, round, and reactive to light.  Neck:     Musculoskeletal: Normal range of motion and neck supple.  Cardiovascular:     Rate and Rhythm: Normal rate and regular rhythm.     Pulses: Normal pulses.     Heart sounds: Normal heart sounds. No murmur. No friction rub. No gallop.   Pulmonary:     Effort: Pulmonary effort is normal. No respiratory distress.  Breath sounds: Normal breath sounds. No stridor. No wheezing, rhonchi or rales.  Chest:     Chest wall: No tenderness.  Musculoskeletal: Normal range of motion.  Skin:    General: Skin is warm and dry.     Capillary Refill: Capillary refill takes less than 2 seconds.     Coloration: Skin is not jaundiced or pale.     Findings: No bruising, erythema, lesion or rash.  Neurological:     General: No focal deficit present.     Mental Status: She is alert and oriented to person, place, and time. Mental status is at baseline.  Psychiatric:        Mood and Affect: Mood normal.        Behavior: Behavior normal.        Thought Content: Thought content  normal.        Judgment: Judgment normal.     Results for orders placed or performed during the hospital encounter of 03/05/19  SARS Coronavirus 2 (Performed in [facility] hospital lab)   Specimen: Nasal Swab  Result Value Ref Range   SARS Coronavirus 2 NEGATIVE NEGATIVE      Assessment & Plan:   Problem List Items Addressed This Visit      Endocrine   DM (diabetes mellitus), type 2 with renal complications (HCC) - Primary    Tolerating the trulicity well. Will increase to 1.5mg  weekly and will continue metformin. Hold on the jardiance for right now given her dysuria. Call with any concerns. Recheck 1 month.       Relevant Medications   Dulaglutide (TRULICITY) 1.5 MG/0.5ML SOPN    Other Visit Diagnoses    Dysuria       Will check UA and treat as needed. Hold on jardiance for now given dysuria and recheck 1 month.    Relevant Orders   UA/M w/rflx Culture, Routine       Follow up plan: Return in about 4 weeks (around 04/07/2019) for rechcek sugars.

## 2019-03-10 NOTE — Chronic Care Management (AMB) (Signed)
Chronic Care Management   Note  03/10/2019 Name: Bonnie Ochoa MRN: [id number] DOB: 09-11-1965   Subjective:  Bonnie Ochoa is a 53 y.o. year old female who is a primary care patient of Bonnie Roys, DO. The CCM team was consulted for assistance with chronic disease management and care coordination needs.    Received referral to assist in diabetes management and chronic disease support. Met with patient face to face prior to her appointment with primary care provider today.  Bonnie Ochoa was given information about Chronic Care Management services today including:  1. CCM service includes personalized support from designated clinical staff supervised by her physician, including individualized plan of care and coordination with other care providers 2. 24/7 contact phone numbers for assistance for urgent and routine care needs. 3. Service will only be billed when office clinical staff spend 20 minutes or more in a month to coordinate care. 4. Only one practitioner may furnish and bill the service in a calendar month. 5. The patient may stop CCM services at any time (effective at the end of the month) by phone call to the office staff. 6. The patient will be responsible for cost sharing (co-pay) of up to 20% of the service fee (after annual deductible is met).  Patient agreed to services and verbal consent obtained.   Review of patient status, including review of consultants reports, laboratory and other test data, was performed as part of comprehensive evaluation and provision of chronic care management services.   Objective:  Lab Results  Component Value Date   CREATININE 1.18 (H) 03/04/2019   CREATININE 1.11 (H) 02/23/2019   CREATININE 1.35 (H) 11/03/2018    Lab Results  Component Value Date   HGBA1C 13.9 (H) 02/23/2019       Component Value Date/Time   CHOL 303 (H) 11/03/2018 1031   TRIG 1,246 (HH) 11/03/2018 1031   HDL 24 (L) 11/03/2018 1031   CHOLHDL 5.4 05/09/2018  0500   VLDL 53 (H) 05/09/2018 0500   LDLCALC Comment 11/03/2018 1031    Clinical ASCVD: noted dx NSTEMI, but I am unable to determine when that happened. Coronary arteries normal upon most recent LHC.   BP Readings from Last 3 Encounters:  03/10/19 124/84  03/04/19 110/77  02/23/19 129/83    Allergies  Allergen Reactions  . Levothyroxine Rash    Medications Reviewed Today    Reviewed by Bonnie Roys, DO (Physician) on 03/10/19 at 1148  Med List Status: <None>  Medication Order Taking? Sig Documenting Provider Last Dose Status Informant  atorvastatin (LIPITOR) 80 MG tablet [id number] Yes Take 1 tablet (80 mg total) by mouth daily at 6 PM. Bonnie Roys, DO Taking Active   blood glucose meter kit and supplies KIT [id number] Yes Dispense based on patient and insurance preference. Use up to four times daily as directed. (FOR ICD-9 250.00, 250.01). Park Liter P, DO Taking Active Self  carvedilol (COREG) 12.5 MG tablet [id number] Yes Take 1 tablet (12.5 mg total) by mouth 2 (two) times daily. Bonnie Gianotti, NP Taking Active Self  cyclobenzaprine (FLEXERIL) 5 MG tablet [id number] Yes Take 5 mg by mouth 3 (three) times daily as needed for muscle spasms. [provider] Taking Active Self  dicyclomine (BENTYL) 20 MG tablet [id number] Yes TAKE 1 TABLET (20 MG TOTAL) BY MOUTH 3 (THREE) TIMES DAILY AS NEEDED FOR SPASMS. Park Liter P, DO Taking Active   digoxin (LANOXIN) 0.125 MG tablet [id number] Yes Take 1 tablet (0.125 mg total)  by mouth daily. Bonnie Grinder D, NP Taking Active Self  Dulaglutide (TRULICITY) 0.01 VC/9.4WH SOPN [id number] Yes Inject 0.75 mg into the skin once a week. Johnson, Megan P, DO Taking Active   Dulaglutide (TRULICITY) 1.5 YK/5.9DJ SOPN [id number]  Inject 1.5 mg into the skin once a week. Johnson, Megan P, DO  Active   DUREZOL 0.05 % EMUL [id number] Yes  [provider] Taking Active   empagliflozin (JARDIANCE) 25 MG TABS tablet [id number]  Yes Take 25 mg by mouth daily. Park Liter P, DO Taking Active   famotidine (PEPCID) 40 MG tablet [id number] Yes TAKE 1 TABLET BY MOUTH EVERY DAY IN THE EVENING Johnson, Megan P, DO Taking Active   furosemide (LASIX) 40 MG tablet [id number] Yes Take 1 tablet (40 mg total) by mouth daily. Bonnie Merritts, MD Taking Active   ILEVRO 0.3 % ophthalmic suspension [id number] Yes  [provider] Taking Active   Insulin Glargine (LANTUS SOLOSTAR) 100 UNIT/ML Solostar Pen [id number] Yes Inject 20 Units into the skin daily at 10 pm. Park Liter P, DO Taking Active   Insulin Pen Needle 32G X 6 MM MISC [id number] Yes 1 each by Does not apply route daily. Johnson, Megan P, DO Taking Active   lamoTRIgine (LAMICTAL) 25 MG tablet [id number] Yes Take 3 tablets (75 mg total) by mouth as directed. Take 25 mg in the AM and 50 mg PM Eappen, Saramma, MD Taking Active   metoCLOPramide (REGLAN) 10 MG tablet [id number] Yes Take 1 tablet (10 mg total) by mouth 3 (three) times daily with meals. Johnson, Megan P, DO Taking Active   nitroGLYCERIN (NITROSTAT) 0.4 MG SL tablet [id number] No Place 1 tablet (0.4 mg total) under the tongue every 5 (five) minutes as needed for chest pain.  Patient not taking: Reported on 03/10/2019   [name], MD Not Taking Active Self           Med Note Vinie Sill Jul 08, 2018 11:44 AM)    pantoprazole (PROTONIX) 40 MG tablet [id number] Yes TAKE 1 TABLET BY MOUTH EVERY DAY Bonnie Lame, MD Taking Active   PARoxetine (PAXIL) 30 MG tablet [id number] Yes Take 1 tablet (30 mg total) by mouth daily. Bonnie Alert, MD Taking Active   potassium chloride SA (K-DUR,KLOR-CON) 20 MEQ tablet [id number] Yes Take 2 tablets (40 mEq total) by mouth 2 (two) times daily. Park Liter P, DO Taking Active   risperiDONE (RISPERDAL) 1 MG tablet [id number] Yes Take 1.5 tablets (1.5 mg total) by mouth at bedtime. Bonnie Alert, MD Taking Active   sacubitril-valsartan (ENTRESTO) 49-51 MG  [id number] Yes Take 1 tablet by mouth 2 (two) times daily. Park Liter P, DO Taking Active   spironolactone (ALDACTONE) 25 MG tablet [id number] Yes Take 25 mg by mouth daily. [provider] Taking Active   sucralfate (CARAFATE) 1 g tablet [id number] Yes TAKE 1 TABLET (1 G TOTAL) BY MOUTH 4 (FOUR) TIMES DAILY. Park Liter P, DO Taking Active Self  zolpidem (AMBIEN) 5 MG tablet [id number] Yes Take 1 tablet (5 mg total) by mouth at bedtime as needed for sleep. Bonnie Alert, MD Taking Active            Assessment:   Goals Addressed            This Visit's Progress   . "We need to work on diabetes"       Current Barriers:  . Diabetes: uncontrolled; most recent A1c 13.9%, worsened from 8.3%. Concurrent HFrEF w/  AICD, followed by Dr. Rockey Situ; bipolar disorder followed by Dr. Shea Evans; Cirrhosis followed by Dr. Bonna Gains . Patient notes that her wife and sister help her with medication management, including filling her pill box. She has a phone alarm reminder to help her remember to take her medications  . Current antihyperglycemic regimen: Lantus 20 units daily (patient's wife administers), Jardiance 25 mg daily, Trulicity 9.77 mg weekly (both restarted by Dr. Wynetta Emery earlier this month) o Endorses burning and itching upon urination since restarting Jardiance. Denies any GI upset with restarting Trulicity.  . Reports hyperglycemic symptoms, including polyuria, polydipsia, nocturia, neuropathy . Current meal patterns: o Breakfast: Sometimes skips; egg, [place] sausage, toast; coffee + splenda  o Lunch: Often skips lunch o Supper: Wife cooks; [place], Loss adjuster, chartered, vegables  o Snacks: None  o Drinks: Water; occasional Sprite; occasional juice . Current exercise: Active up and down stairs, walks her dog when it isn't hot outside . Current blood glucose readings: 200s right now, but not checking oftens  . Cardiovascular risk reduction: o Current hypertensive/CHF regimen: carvedilol 12.5  mg BID, digoxin 0.125 mg daily, Entresto 49/51 mg BID, spironolactone 25 mg daily  o Current hyperlipidemia regimen: atorvastatin 20 mg daily; LDL unable to be calculated on last check 10/2018 Ochoa/t significantly elevated triglycerides, but was 84 on last check 04/2018; not at goal <70   Pharmacist Clinical Goal(s):  Marland Kitchen Over the next 90 days, patient with work with PharmD and primary care provider to address optimized diabetes management  Interventions: . Comprehensive medication review performed . Informed Dr. Wynetta Emery of genitourinary symptoms; holding Jardiance and increasing Trulicity to max 1.5 mg weekly dose. Continue Lantus 20 units daily. . Discussed goal A1c, goal fasting, and goal postprandial blood sugar. Encouraged patient to start checking BID and documenting; provided with a blood sugar log to facilitate telephonic review at future appointments .   Patient Self Care Activities:  . Patient will check blood glucose BID, document, and provide at future appointments . Patient will take medications as prescribed  Initial goal documentation        Plan: - Will outreach patient in 3-4 weeks for continued medication management support. Will plan to engage her caregivers (wife and sister)  Courtney Heys, PharmD Clinical Pharmacist [place] [phone]

## 2019-03-10 NOTE — Assessment & Plan Note (Signed)
Tolerating the trulicity well. Will increase to 1.5mg  weekly and will continue metformin. Hold on the jardiance for right now given her dysuria. Call with any concerns. Recheck 1 month.

## 2019-03-10 NOTE — Patient Instructions (Signed)
STOP THE JARDIANCE UNTIL YOU SEE ME NEXT TIME TO AVOID THE BURNING WHEN YOU PEE  CHANGE FROM 7.54 OF THE TRULICITY TO 1.5MG  WEEKLY- NEW RX IS AT [facility]

## 2019-03-10 NOTE — Patient Instructions (Signed)
Visit Information  Goals Addressed            This Visit's Progress   . "We need to work on diabetes"       Current Barriers:  . Diabetes: uncontrolled; most recent A1c 13.9%, worsened from 8.3%. Concurrent HFrEF w/ AICD, followed by Dr. Rockey Situ; bipolar disorder followed by Dr. Shea Evans; Cirrhosis followed by Dr. Bonna Gains . Patient notes that her wife and sister help her with medication management, including filling her pill box. She has a phone alarm reminder to help her remember to take her medications  . Current antihyperglycemic regimen: Lantus 20 units daily (patient's wife administers), Jardiance 25 mg daily, Trulicity 0.93 mg weekly (both restarted by Dr. Wynetta Emery earlier this month) o Endorses burning and itching upon urination since restarting Jardiance. Denies any GI upset with restarting Trulicity.  . Reports hyperglycemic symptoms, including polyuria, polydipsia, nocturia, neuropathy . Current meal patterns: o Breakfast: Sometimes skips; egg, [place] sausage, toast; coffee + splenda  o Lunch: Often skips lunch o Supper: Wife cooks; [place], Loss adjuster, chartered, vegables  o Snacks: None  o Drinks: Water; occasional Sprite; occasional juice . Current exercise: Active up and down stairs, walks her dog when it isn't hot outside . Current blood glucose readings: 200s right now, but not checking oftens  . Cardiovascular risk reduction: o Current hypertensive/CHF regimen: carvedilol 12.5 mg BID, digoxin 0.125 mg daily, Entresto 49/51 mg BID, spironolactone 25 mg daily  o Current hyperlipidemia regimen: atorvastatin 20 mg daily; LDL unable to be calculated on last check 10/2018 d/t significantly elevated triglycerides, but was 84 on last check 04/2018; not at goal <70   Pharmacist Clinical Goal(s):  Marland Kitchen Over the next 90 days, patient with work with PharmD and primary care provider to address optimized diabetes management  Interventions: . Comprehensive medication review performed . Informed Dr.  Wynetta Emery of genitourinary symptoms; holding Jardiance and increasing Trulicity to max 1.5 mg weekly dose. Continue Lantus 20 units daily. . Discussed goal A1c, goal fasting, and goal postprandial blood sugar. Encouraged patient to start checking BID and documenting; provided with a blood sugar log to facilitate telephonic review at future appointments .   Patient Self Care Activities:  . Patient will check blood glucose BID, document, and provide at future appointments . Patient will take medications as prescribed  Initial goal documentation        Ms. Molock was given information about Chronic Care Management services today including:  1. CCM service includes personalized support from designated clinical staff supervised by her physician, including individualized plan of care and coordination with other care providers 2. 24/7 contact phone numbers for assistance for urgent and routine care needs. 3. Service will only be billed when office clinical staff spend 20 minutes or more in a month to coordinate care. 4. Only one practitioner may furnish and bill the service in a calendar month. 5. The patient may stop CCM services at any time (effective at the end of the month) by phone call to the office staff. 6. The patient will be responsible for cost sharing (co-pay) of up to 20% of the service fee (after annual deductible is met).  Patient agreed to services and verbal consent obtained.   The patient verbalized understanding of instructions provided today and declined a print copy of patient instruction materials.   Plan: - Will outreach patient in 3-4 weeks for continued medication management support. Will plan to engage her caregivers (wife and sister)  Catie Darnelle Maffucci, PharmD Clinical Pharmacist [facility]/[employer] [phone]

## 2019-03-11 ENCOUNTER — Ambulatory Visit: Payer: Medicare Other | Admitting: Certified Registered Nurse Anesthetist

## 2019-03-11 ENCOUNTER — Ambulatory Visit
Admission: RE | Admit: 2019-03-11 | Discharge: 2019-03-11 | Disposition: A | Discharge status: Home / Self Care | Payer: Medicare Other | Attending: Gastroenterology | Admitting: Gastroenterology

## 2019-03-11 ENCOUNTER — Encounter: Payer: Self-pay | Admitting: Certified Registered Nurse Anesthetist

## 2019-03-11 ENCOUNTER — Telehealth (HOSPITAL_COMMUNITY): Payer: Self-pay

## 2019-03-11 ENCOUNTER — Encounter
Admission: RE | Disposition: A | Discharge status: Home / Self Care | Payer: Self-pay | Source: Home / Self Care | Attending: Gastroenterology

## 2019-03-11 DIAGNOSIS — Z951 Presence of aortocoronary bypass graft: Secondary | ICD-10-CM | POA: Insufficient documentation

## 2019-03-11 DIAGNOSIS — K644 Residual hemorrhoidal skin tags: Secondary | ICD-10-CM | POA: Diagnosis not present

## 2019-03-11 DIAGNOSIS — Z79899 Other long term (current) drug therapy: Secondary | ICD-10-CM | POA: Insufficient documentation

## 2019-03-11 DIAGNOSIS — Z833 Family history of diabetes mellitus: Secondary | ICD-10-CM | POA: Insufficient documentation

## 2019-03-11 DIAGNOSIS — E039 Hypothyroidism, unspecified: Secondary | ICD-10-CM | POA: Insufficient documentation

## 2019-03-11 DIAGNOSIS — G4733 Obstructive sleep apnea (adult) (pediatric): Secondary | ICD-10-CM | POA: Insufficient documentation

## 2019-03-11 DIAGNOSIS — K589 Irritable bowel syndrome without diarrhea: Secondary | ICD-10-CM | POA: Insufficient documentation

## 2019-03-11 DIAGNOSIS — J45909 Unspecified asthma, uncomplicated: Secondary | ICD-10-CM | POA: Diagnosis not present

## 2019-03-11 DIAGNOSIS — E119 Type 2 diabetes mellitus without complications: Secondary | ICD-10-CM | POA: Diagnosis not present

## 2019-03-11 DIAGNOSIS — K228 Other specified diseases of esophagus: Secondary | ICD-10-CM | POA: Diagnosis not present

## 2019-03-11 DIAGNOSIS — I509 Heart failure, unspecified: Secondary | ICD-10-CM | POA: Insufficient documentation

## 2019-03-11 DIAGNOSIS — M1990 Unspecified osteoarthritis, unspecified site: Secondary | ICD-10-CM | POA: Diagnosis not present

## 2019-03-11 DIAGNOSIS — G2581 Restless legs syndrome: Secondary | ICD-10-CM | POA: Diagnosis not present

## 2019-03-11 DIAGNOSIS — F4310 Post-traumatic stress disorder, unspecified: Secondary | ICD-10-CM | POA: Insufficient documentation

## 2019-03-11 DIAGNOSIS — Z809 Family history of malignant neoplasm, unspecified: Secondary | ICD-10-CM | POA: Insufficient documentation

## 2019-03-11 DIAGNOSIS — K2960 Other gastritis without bleeding: Secondary | ICD-10-CM | POA: Insufficient documentation

## 2019-03-11 DIAGNOSIS — R011 Cardiac murmur, unspecified: Secondary | ICD-10-CM | POA: Diagnosis not present

## 2019-03-11 DIAGNOSIS — Z87891 Personal history of nicotine dependence: Secondary | ICD-10-CM | POA: Insufficient documentation

## 2019-03-11 DIAGNOSIS — F319 Bipolar disorder, unspecified: Secondary | ICD-10-CM | POA: Insufficient documentation

## 2019-03-11 DIAGNOSIS — D649 Anemia, unspecified: Secondary | ICD-10-CM | POA: Insufficient documentation

## 2019-03-11 DIAGNOSIS — K3189 Other diseases of stomach and duodenum: Secondary | ICD-10-CM

## 2019-03-11 DIAGNOSIS — R42 Dizziness and giddiness: Secondary | ICD-10-CM | POA: Insufficient documentation

## 2019-03-11 DIAGNOSIS — K5790 Diverticulosis of intestine, part unspecified, without perforation or abscess without bleeding: Secondary | ICD-10-CM | POA: Diagnosis not present

## 2019-03-11 DIAGNOSIS — Z1211 Encounter for screening for malignant neoplasm of colon: Secondary | ICD-10-CM | POA: Diagnosis not present

## 2019-03-11 DIAGNOSIS — N189 Chronic kidney disease, unspecified: Secondary | ICD-10-CM | POA: Diagnosis not present

## 2019-03-11 DIAGNOSIS — Z95810 Presence of automatic (implantable) cardiac defibrillator: Secondary | ICD-10-CM | POA: Diagnosis not present

## 2019-03-11 DIAGNOSIS — Z794 Long term (current) use of insulin: Secondary | ICD-10-CM | POA: Insufficient documentation

## 2019-03-11 DIAGNOSIS — I428 Other cardiomyopathies: Secondary | ICD-10-CM | POA: Insufficient documentation

## 2019-03-11 DIAGNOSIS — I110 Hypertensive heart disease with heart failure: Secondary | ICD-10-CM | POA: Diagnosis not present

## 2019-03-11 DIAGNOSIS — Z825 Family history of asthma and other chronic lower respiratory diseases: Secondary | ICD-10-CM | POA: Insufficient documentation

## 2019-03-11 DIAGNOSIS — K210 Gastro-esophageal reflux disease with esophagitis: Secondary | ICD-10-CM | POA: Diagnosis not present

## 2019-03-11 DIAGNOSIS — Z820 Family history of epilepsy and other diseases of the nervous system: Secondary | ICD-10-CM | POA: Insufficient documentation

## 2019-03-11 DIAGNOSIS — F909 Attention-deficit hyperactivity disorder, unspecified type: Secondary | ICD-10-CM | POA: Diagnosis not present

## 2019-03-11 DIAGNOSIS — G4700 Insomnia, unspecified: Secondary | ICD-10-CM | POA: Insufficient documentation

## 2019-03-11 DIAGNOSIS — I5022 Chronic systolic (congestive) heart failure: Secondary | ICD-10-CM | POA: Diagnosis not present

## 2019-03-11 DIAGNOSIS — K7460 Unspecified cirrhosis of liver: Secondary | ICD-10-CM

## 2019-03-11 DIAGNOSIS — K5730 Diverticulosis of large intestine without perforation or abscess without bleeding: Secondary | ICD-10-CM | POA: Insufficient documentation

## 2019-03-11 DIAGNOSIS — I499 Cardiac arrhythmia, unspecified: Secondary | ICD-10-CM | POA: Diagnosis not present

## 2019-03-11 DIAGNOSIS — I130 Hypertensive heart and chronic kidney disease with heart failure and stage 1 through stage 4 chronic kidney disease, or unspecified chronic kidney disease: Secondary | ICD-10-CM | POA: Diagnosis not present

## 2019-03-11 DIAGNOSIS — E1122 Type 2 diabetes mellitus with diabetic chronic kidney disease: Secondary | ICD-10-CM | POA: Diagnosis not present

## 2019-03-11 DIAGNOSIS — I2510 Atherosclerotic heart disease of native coronary artery without angina pectoris: Secondary | ICD-10-CM | POA: Diagnosis not present

## 2019-03-11 DIAGNOSIS — Z9841 Cataract extraction status, right eye: Secondary | ICD-10-CM | POA: Insufficient documentation

## 2019-03-11 DIAGNOSIS — Z888 Allergy status to other drugs, medicaments and biological substances status: Secondary | ICD-10-CM | POA: Insufficient documentation

## 2019-03-11 DIAGNOSIS — R51 Headache: Secondary | ICD-10-CM | POA: Insufficient documentation

## 2019-03-11 DIAGNOSIS — Z90710 Acquired absence of both cervix and uterus: Secondary | ICD-10-CM | POA: Insufficient documentation

## 2019-03-11 DIAGNOSIS — Z8249 Family history of ischemic heart disease and other diseases of the circulatory system: Secondary | ICD-10-CM | POA: Insufficient documentation

## 2019-03-11 HISTORY — PX: COLONOSCOPY WITH PROPOFOL: SHX5780

## 2019-03-11 HISTORY — DX: Presence of automatic (implantable) cardiac defibrillator: Z95.810

## 2019-03-11 HISTORY — PX: ESOPHAGOGASTRODUODENOSCOPY (EGD) WITH PROPOFOL: SHX5813

## 2019-03-11 LAB — GLUCOSE, CAPILLARY: Glucose-Capillary: 201 mg/dL — ABNORMAL HIGH (ref 70–99)

## 2019-03-11 SURGERY — COLONOSCOPY WITH PROPOFOL
Anesthesia: General

## 2019-03-11 MED ORDER — FENTANYL CITRATE (PF) 100 MCG/2ML IJ SOLN
INTRAMUSCULAR | Status: DC | PRN
  Administered 2019-03-11: 50 ug via INTRAVENOUS

## 2019-03-11 MED ORDER — MIDAZOLAM HCL 2 MG/2ML IJ SOLN
INTRAMUSCULAR | Status: DC | PRN
  Administered 2019-03-11: 2 mg via INTRAVENOUS

## 2019-03-11 MED ORDER — PROPOFOL 10 MG/ML IV BOLUS
INTRAVENOUS | Status: DC | PRN
  Administered 2019-03-11: 40 mg via INTRAVENOUS

## 2019-03-11 MED ORDER — LIDOCAINE HCL (PF) 2 % IJ SOLN
INTRAMUSCULAR | Status: AC
  Filled 2019-03-11: qty 10

## 2019-03-11 MED ORDER — PHENYLEPHRINE HCL (PRESSORS) 10 MG/ML IV SOLN
INTRAVENOUS | Status: DC | PRN
  Administered 2019-03-11: 100 ug via INTRAVENOUS

## 2019-03-11 MED ORDER — SODIUM CHLORIDE 0.9 % IV SOLN
INTRAVENOUS | Status: DC
  Administered 2019-03-11: 09:00:00 via INTRAVENOUS

## 2019-03-11 MED ORDER — LIDOCAINE HCL (CARDIAC) PF 100 MG/5ML IV SOSY
PREFILLED_SYRINGE | INTRAVENOUS | Status: DC | PRN
  Administered 2019-03-11: 50 mg via INTRAVENOUS

## 2019-03-11 MED ORDER — MIDAZOLAM HCL 2 MG/2ML IJ SOLN
INTRAMUSCULAR | Status: AC
  Filled 2019-03-11: qty 2

## 2019-03-11 MED ORDER — FENTANYL CITRATE (PF) 100 MCG/2ML IJ SOLN
INTRAMUSCULAR | Status: AC
  Filled 2019-03-11: qty 2

## 2019-03-11 MED ORDER — PROPOFOL 500 MG/50ML IV EMUL
INTRAVENOUS | Status: DC | PRN
  Administered 2019-03-11: 110 ug/kg/min via INTRAVENOUS

## 2019-03-11 MED ORDER — PROPOFOL 500 MG/50ML IV EMUL
INTRAVENOUS | Status: AC
  Filled 2019-03-11: qty 50

## 2019-03-11 MED ORDER — PHENYLEPHRINE HCL (PRESSORS) 10 MG/ML IV SOLN
INTRAVENOUS | Status: AC
  Filled 2019-03-11: qty 1

## 2019-03-11 NOTE — Op Note (Signed)
[facility] Gastroenterology Patient Name: Bonnie Ochoa Procedure Date: 03/11/2019 9:18 AM MRN: [id number] Account #: [id number] Date of Birth: 1966/03/17 Admit Type: Outpatient Age: 53 Room: [facility] ENDO ROOM 2 Gender: Female Note Status: Finalized Procedure:            Colonoscopy Indications:          Screening for colorectal malignant neoplasm Providers:            Breshae Belcher B. Bonna Gains MD, MD Medicines:            Monitored Anesthesia Care Complications:        No immediate complications. Procedure:            Pre-Anesthesia Assessment:                       - Prior to the procedure, a History and Physical was                        performed, and patient medications, allergies and                        sensitivities were reviewed. The patient's tolerance of                        previous anesthesia was reviewed.                       - The risks and benefits of the procedure and the                        sedation options and risks were discussed with the                        patient. All questions were answered and informed                        consent was obtained.                       - Patient identification and proposed procedure were                        verified prior to the procedure by the physician, the                        nurse, the anesthetist and the technician. The                        procedure was verified in the pre-procedure area in the                        procedure room in the endoscopy suite.                       - ASA Grade Assessment: II - A patient with mild                        systemic disease.                       - After reviewing the risks and benefits, the patient  was deemed in satisfactory condition to undergo the                        procedure.                       After obtaining informed consent, the colonoscope was                        passed under direct vision. Throughout the  procedure,                        the patient's blood pressure, pulse, and oxygen                        saturations were monitored continuously. The                        Colonoscope was introduced through the anus and                        advanced to the the cecum, identified by the ileocecal                        valve. The colonoscopy was performed with ease. The                        patient tolerated the procedure well. The quality of                        the bowel preparation was poor except the transverse                        colon was good. Findings:      Hemorrhoids were found on perianal exam.      A large amount of solid stool was found in the ascending colon and in       the cecum, precluding visualization.      A few diverticula were found in the sigmoid colon and ascending colon.      The exam was otherwise without abnormality.      The retroflexed view of the distal rectum and anal verge was normal and       showed no anal or rectal abnormalities. Impression:           - Hemorrhoids found on perianal exam.                       - Stool in the ascending colon and in the cecum.                       - Diverticulosis in the sigmoid colon and in the                        ascending colon.                       - The examination was otherwise normal.                       - The distal rectum and anal verge are normal on  retroflexion view.                       - No specimens collected. Recommendation:       - Discharge patient to home.                       - Resume previous diet.                       - Continue present medications.                       - Repeat colonoscopy within 3 months, with 2 day prep                        (low fiber diet 1 week before) for screening purposes.                       - Return to primary care physician as previously                        scheduled.                       - The findings and  recommendations were discussed with                        the patient.                       - The findings and recommendations were discussed with                        the patient's family.                       - High fiber diet. Procedure Code(s):    --- Professional ---                       [phone], Colonoscopy, flexible; diagnostic, including                        collection of specimen(s) by brushing or washing, when                        performed (separate procedure) Diagnosis Code(s):    --- Professional ---                       Z12.11, Encounter for screening for malignant neoplasm                        of colon                       K64.9, Unspecified hemorrhoids                       K57.30, Diverticulosis of large intestine without                        perforation or abscess without bleeding CPT copyright 2019 American Medical Association. All rights reserved. The codes documented in this report are preliminary  and upon coder review may  be revised to meet current compliance requirements.  Melodie BouillonVarnita Corin Tilly, MD Michel BickersVarnita B. Maximino Greenlandahiliani MD, MD 03/11/2019 [id number]:10:[id number]:28:25 AM This report has been signed electronically. Number of Addenda: 0 Note Initiated On: 03/11/2019 9:18 AM Scope Withdrawal Time: 0 hours 10 minutes 6 seconds  Total Procedure Duration: 0 hours 17 minutes 57 seconds       [facility]

## 2019-03-11 NOTE — Anesthesia Post-op Follow-up Note (Signed)
Anesthesia QCDR form completed.        

## 2019-03-11 NOTE — Op Note (Signed)
[facility] Gastroenterology Patient Name: Bonnie Ochoa Procedure Date: 03/11/2019 9:18 AM MRN: [id number] Account #: [id number] Date of Birth: 02/06/1966 Admit Type: Outpatient Age: 53 Room: [facility] ENDO ROOM 2 Gender: Female Note Status: Finalized Procedure:            Upper GI endoscopy Indications:          Cirrhosis rule out esophageal varices Providers:            Dewayne Severe B. Bonna Gains MD, MD Medicines:            Monitored Anesthesia Care Complications:        No immediate complications. Procedure:            Pre-Anesthesia Assessment:                       - Prior to the procedure, a History and Physical was                        performed, and patient medications, allergies and                        sensitivities were reviewed. The patient's tolerance of                        previous anesthesia was reviewed.                       - The risks and benefits of the procedure and the                        sedation options and risks were discussed with the                        patient. All questions were answered and informed                        consent was obtained.                       - Patient identification and proposed procedure were                        verified prior to the procedure by the physician, the                        nurse, the anesthesiologist, the anesthetist and the                        technician. The procedure was verified in the procedure                        room.                       - ASA Grade Assessment: II - A patient with mild                        systemic disease.                       After obtaining informed consent, the endoscope was  passed under direct vision. Throughout the procedure,                        the patient's blood pressure, pulse, and oxygen                        saturations were monitored continuously. The Endoscope                        was introduced through the  mouth, and advanced to the                        third part of duodenum. The upper GI endoscopy was                        accomplished with ease. The patient tolerated the                        procedure well. Findings:      Islands of salmon-colored mucosa were present. The maximum longitudinal       extent of these esophageal mucosal changes was 1 cm in length. Mucosa       was biopsied with a cold forceps for histology in a targeted manner and       in 4 quadrants. One specimen bottle was sent to pathology. 1 small       island seen.      There is no endoscopic evidence of varices in the entire esophagus.      Localized mild mucosal changes characterized by thickened folds were       found in the gastric antrum. Biopsies were taken with a cold forceps for       histology.      The exam of the stomach was otherwise normal.      The duodenal bulb, second portion of the duodenum, third portion of the       duodenum and examined duodenum were normal. Impression:           - Salmon-colored mucosa suspicious for short-segment                        Barrett's esophagus. Biopsied.                       - Thickened folds mucosa in the antrum. Biopsied.                       - Normal duodenal bulb, second portion of the duodenum,                        third portion of the duodenum and examined duodenum. Recommendation:       - Await pathology results.                       - Discharge patient to home (with escort).                       - Advance diet as tolerated.                       - Continue present medications.                       -  Patient has a contact number available for                        emergencies. The signs and symptoms of potential                        delayed complications were discussed with the patient.                        Return to normal activities tomorrow. Written discharge                        instructions were provided to the patient.                        - Discharge patient to home (with escort).                       - The findings and recommendations were discussed with                        the patient.                       - The findings and recommendations were discussed with                        the patient's family. Procedure Code(s):    --- Professional ---                       [phone], Esophagogastroduodenoscopy, flexible, transoral;                        with biopsy, single or multiple Diagnosis Code(s):    --- Professional ---                       K22.8, Other specified diseases of esophagus                       K31.89, Other diseases of stomach and duodenum                       K74.60, Unspecified cirrhosis of liver CPT copyright 2019 American Medical Association. All rights reserved. The codes documented in this report are preliminary and upon coder review may  be revised to meet current compliance requirements.  Melodie Bouillon, MD Michel Bickers B. Maximino Greenland MD, MD 03/11/2019 10:03:38 AM This report has been signed electronically. Number of Addenda: 0 Note Initiated On: 03/11/2019 9:18 AM Total Procedure Duration: 0 hours 12 minutes 17 seconds  Estimated Blood Loss: Estimated blood loss: none.      [facility]

## 2019-03-11 NOTE — Anesthesia Preprocedure Evaluation (Addendum)
EngATSelect SpKoreaecElm<MEAKProduct/process development scie73nHerbalistCape Canaveral HospitaSt [facility] Hspt[facility] [place] TAG> Lorne SkeensountyLorne SkeensHosNyNyra Capesra <BADTSanford Transp ant CenterXKoTimor-LesteTAG orne Skeensute PcespiNyra Capestal  Anesthesia Physical Anesthesia Plan  ASA: III  Anesthesia Plan: General   Post-op Pain Management:    Induction: Intravenous  PONV Risk Score and Plan: 3 and Propofol infusion and TIVA  Airway Management Planned: Natural Airway and Nasal Cannula  Additional Equipment:   Intra-op Plan:   Post-operative Plan:   Informed Consent: I have reviewed the patients History and Physical, chart, labs and discussed the procedure including the risks, benefits and alternatives for the proposed anesthesia with the patient or authorized representative who has indicated his/her understanding and acceptance.     Dental Advisory Given  Plan Discussed with: Anesthesiologist, CRNA and Surgeon  Anesthesia Plan Comments:         Anesthesia Quick Evaluation

## 2019-03-11 NOTE — Anesthesia Postprocedure Evaluation (Signed)
Anesthesia Post Note  Patient: Bonnie Ochoa  Procedure(s) Performed: COLONOSCOPY WITH PROPOFOL (N/A ) ESOPHAGOGASTRODUODENOSCOPY (EGD) WITH PROPOFOL (N/A )  Patient location during evaluation: Endoscopy Anesthesia Type: General Level of consciousness: awake and alert Pain management: pain level controlled Vital Signs Assessment: post-procedure vital signs reviewed and stable Respiratory status: spontaneous breathing, nonlabored ventilation, respiratory function stable and patient connected to nasal cannula oxygen Cardiovascular status: blood pressure returned to baseline and stable Postop Assessment: no apparent nausea or vomiting Anesthetic complications: no     Last Vitals:  Vitals:   03/11/19 1028 03/11/19 1048  BP: 107/67 135/87  Pulse: 76   Resp: 18   Temp:    SpO2: 91%     Last Pain:  Vitals:   03/11/19 1048  TempSrc:   PainSc: 0-No pain                 Martha Clan

## 2019-03-11 NOTE — Transfer of Care (Signed)
Immediate Anesthesia Transfer of Care Note  Patient: Bonnie Ochoa  Procedure(s) Performed: COLONOSCOPY WITH PROPOFOL (N/A ) ESOPHAGOGASTRODUODENOSCOPY (EGD) WITH PROPOFOL (N/A )  Patient Location: PACU and Endoscopy Unit  Anesthesia Type:General  Level of Consciousness: drowsy  Airway & Oxygen Therapy: Patient Spontanous Breathing and Patient connected to nasal cannula oxygen  Post-op Assessment: Report given to RN and Post -op Vital signs reviewed and stable  Post vital signs: Reviewed and stable  Last Vitals:  Vitals Value Taken Time  BP 107/67 03/11/19 1028  Temp 36.2 C 03/11/19 1026  Pulse 76 03/11/19 1028  Resp 18 03/11/19 1028  SpO2 90 % 03/11/19 1028  Vitals shown include unvalidated device data.  Last Pain:  Vitals:   03/11/19 1026  TempSrc: Tympanic         Complications: No apparent anesthesia complications

## 2019-03-11 NOTE — H&P (Signed)
Bonnie Antigua, MD [address], [place], [place], [place], [address] [address], [place], [place], [place], [address] Phone: [phone]  Fax: [phone]  Primary Care Physician:  Valerie Roys, DO   Pre-Procedure History & Physical: HPI:  Bonnie Ochoa is a 53 y.o. female is here for a colonoscopy and EGD.   Past Medical History:  Diagnosis Date  . Abdominal pain    a. 05/2018 HIDA scan wnl.  . ADHD   . AICD (automatic cardioverter/defibrillator) present   . Anemia   . Arthritis   . Asthma   . Bipolar 1 disorder ([place])   . Chest pain    a. Hx of cath in [place] - reportedly nl; b. 04/2018 MV: EF 22%, fixed dist ant septal, apical, and inferoapical defects - ? scar vs. attenuation. No ischemia.  . CHF (congestive heart failure) ([place])   . Cirrhosis of liver ([place])   . Coronary artery disease   . Depression   . Diabetes mellitus without complication ([place])   . Diverticulitis   . Dysrhythmia   . Heart murmur   . HFrEF (heart failure with reduced ejection fraction) ([place])    a. 06/2017 Echo: EF 20-25%, diff HK. Mildly dil LA; b. 05/2018 Echo: EF 25-30%, diff HK, Gr2 DD. Triv AI. Mod MR. Mildly reduced RV fxn. Mod-Sev TR. PASP 45-15mHg.  .Marland KitchenHypertension   . Hypothyroidism   . IBS (irritable bowel syndrome)   . Insomnia   . Migraines   . NICM (nonischemic cardiomyopathy) ([place]    a. EF prev 25%; b. 11/2014 s/p SJM Fortify Assura, single lead AICD (ser# [id number]; c. 06/2017 Echo: EF 20-25%; d. 05/2018 Echo: EF 25-30%, diff HK, Gr2 DD; e. 05/2018 Cath: Nl cors. LVEDP 275mg, PCWP 3255m. PA 65/40 (52). CO/CI 3.04/1.56.  . OSA (obstructive sleep apnea)    CPAP is broken  . PTSD (post-traumatic stress disorder)   . Restless leg syndrome   . Vertigo     Past Surgical History:  Procedure Laterality Date  . ABDOMINAL HYSTERECTOMY    . CARDIAC CATHETERIZATION    . CATARACT EXTRACTION W/PHACO Right 01/27/2019   Procedure: CATARACT EXTRACTION PHACO AND INTRAOCULAR LENS  PLACEMENT ([place] BLUE RIGHT DIABETES;  Surgeon: PorBirder RobsonD;  [facility];  Laterality: Right;  Diabetes - insulin sleep apnea  . CORONARY ARTERY BYPASS GRAFT     Pt denies  . INSERT / [place]PLACE / [place]  ICD  . INSERTION OF ICD  2016   St Jude Single chamber ICD  . RIGHT/LEFT HEART CATH AND CORONARY ANGIOGRAPHY N/A 06/09/2018   Procedure: RIGHT/LEFT HEART CATH AND CORONARY ANGIOGRAPHY;  Surgeon: AriWellington HampshireD;  [facility];  Service: Cardiovascular;  Laterality: N/A;  . TONSILLECTOMY    . TUBAL LIGATION  1980    Prior to Admission medications   Medication Sig Start Date End Date Taking? Authorizing Provider  atorvastatin (LIPITOR) 80 MG tablet Take 1 tablet (80 mg total) by mouth daily at 6 PM. 11/06/18  Yes Johnson, Megan P, DO  carvedilol (COREG) 12.5 MG tablet Take 1 tablet (12.5 mg total) by mouth 2 (two) times daily. 06/03/18 09/19/19 Yes BerTheora GianottiP  cyclobenzaprine (FLEXERIL) 5 MG tablet Take 5 mg by mouth 3 (three) times daily as needed for muscle spasms.   Yes [provider]  digoxin (LANOXIN) 0.125 MG tablet Take 1 tablet (0.125 mg total) by mouth daily.  07/14/18  Yes Clegg, Amy D, NP  Dulaglutide (TRULICITY) 1.5 OA/4.1YS SOPN Inject 1.5 mg into the skin once a week. 03/10/19  Yes Johnson, Megan P, DO  furosemide (LASIX) 40 MG tablet Take 1 tablet (40 mg total) by mouth daily. 12/22/18  Yes Gollan, Kathlene November, MD  Insulin Glargine (LANTUS SOLOSTAR) 100 UNIT/ML Solostar Pen Inject 20 Units into the skin daily at 10 pm. 11/03/18  Yes Johnson, Megan P, DO  lamoTRIgine (LAMICTAL) 25 MG tablet Take 3 tablets (75 mg total) by mouth as directed. Take 25 mg in the AM and 50 mg PM 01/06/19  Yes Eappen, Saramma, MD  pantoprazole (PROTONIX) 40 MG tablet TAKE 1 TABLET BY MOUTH EVERY DAY 09/22/18  Yes [name], MD  PARoxetine (PAXIL) 30 MG tablet Take 1 tablet (30 mg total)  by mouth daily. 01/06/19  Yes [name], MD  potassium chloride SA (K-DUR,KLOR-CON) 20 MEQ tablet Take 2 tablets (40 mEq total) by mouth 2 (two) times daily. 11/03/18  Yes Johnson, Megan P, DO  spironolactone (ALDACTONE) 25 MG tablet Take 25 mg by mouth daily. 11/16/18  Yes [provider]  sucralfate (CARAFATE) 1 g tablet TAKE 1 TABLET (1 G TOTAL) BY MOUTH 4 (FOUR) TIMES DAILY. 07/07/18  Yes Johnson, Megan P, DO  zolpidem (AMBIEN) 5 MG tablet Take 1 tablet (5 mg total) by mouth at bedtime as needed for sleep. 02/05/19  Yes [name], MD  blood glucose meter kit and supplies KIT Dispense based on patient and insurance preference. Use up to four times daily as directed. (FOR ICD-9 250.00, 250.01). 07/24/17   Wynetta Emery, Megan P, DO  dicyclomine (BENTYL) 20 MG tablet TAKE 1 TABLET (20 MG TOTAL) BY MOUTH 3 (THREE) TIMES DAILY AS NEEDED FOR SPASMS. Patient not taking: Reported on 03/11/2019 01/17/19   Park Liter P, DO  Dulaglutide (TRULICITY) 0.63 KZ/6.0FU SOPN Inject 0.75 mg into the skin once a week. 02/23/19   Johnson, Megan P, DO  DUREZOL 0.05 % EMUL  01/15/19   [provider]  empagliflozin (JARDIANCE) 25 MG TABS tablet Take 25 mg by mouth daily. Patient not taking: Reported on 03/11/2019 02/23/19   Park Liter P, DO  famotidine (PEPCID) 40 MG tablet TAKE 1 TABLET BY MOUTH EVERY DAY IN THE EVENING Patient not taking: Reported on 03/11/2019 11/03/18   Park Liter P, DO  ILEVRO 0.3 % ophthalmic suspension  01/15/19   [provider]  Insulin Pen Needle 32G X 6 MM MISC 1 each by Does not apply route daily. 11/03/18   Johnson, Megan P, DO  metoCLOPramide (REGLAN) 10 MG tablet Take 1 tablet (10 mg total) by mouth 3 (three) times daily with meals. 11/03/18 11/03/19  Park Liter P, DO  nitroGLYCERIN (NITROSTAT) 0.4 MG SL tablet Place 1 tablet (0.4 mg total) under the tongue every 5 (five) minutes as needed for chest pain. Patient not taking: Reported on 03/10/2019 05/19/18  05/19/19  [name], MD  risperiDONE (RISPERDAL) 1 MG tablet Take 1.5 tablets (1.5 mg total) by mouth at bedtime. 02/16/19   [name], MD  sacubitril-valsartan (ENTRESTO) 49-51 MG Take 1 tablet by mouth 2 (two) times daily. 11/03/18   Park Liter P, DO    Allergies as of 03/06/2019 - Review Complete 03/04/2019  Allergen Reaction Noted  . Levothyroxine Rash 07/02/2017    Family History  Problem Relation Age of Onset  . Hypertension Mother   . Hyperlipidemia Mother   . Heart disease Mother   . Hypertension Sister   .  Asthma Sister   . Heart disease Sister   . Diabetes Sister   . Cancer Sister   . Alzheimer's disease Maternal Grandfather   . Hyperlipidemia Brother   . Asthma Sister   . Hypertension Sister   . Diabetes Sister     Social History   Socioeconomic History  . Marital status: Married    Spouse name: Not on file  . Number of children: 2  . Years of education: Not on file  . Highest education level: High school graduate  Occupational History    Comment: disabled  Social Needs  . Financial resource strain: Not hard at all  . Food insecurity    Worry: Sometimes true    Inability: Sometimes true  . Transportation needs    Medical: Yes    Non-medical: Yes  Tobacco Use  . Smoking status: Former Smoker    Types: Cigarettes    Quit date: 1985    Years since quitting: 35.5  . Smokeless tobacco: Never Used  . Tobacco comment: quit over  20 years ago   Substance and Sexual Activity  . Alcohol use: Not Currently  . Drug use: Not Currently    Frequency: 1.0 times per week    Types: Marijuana  . Sexual activity: Yes    Birth control/protection: Surgical    Comment: one partner  Lifestyle  . Physical activity    Days per week: 0 days    Minutes per session: 0 min  . Stress: Very much  Relationships  . Social Herbalist on phone: Never    Gets together: Twice a week    Attends religious service: Never    Active member of club or  organization: No    Attends meetings of clubs or organizations: Never    Relationship status: Married  . Intimate partner violence    Fear of current or ex partner: No    Emotionally abused: No    Physically abused: No    Forced sexual activity: No  Other Topics Concern  . Not on file  Social History Narrative   Volunteers occasionally     Review of Systems: See HPI, otherwise negative ROS  Physical Exam: BP 122/83   Pulse 77   Temp (!) 96 F (35.6 C) (Tympanic)   Resp 16   Ht '5\' 4"'$  (1.626 m)   Wt 88 kg   SpO2 97%   BMI 33.30 kg/m  General:   Alert,  pleasant and cooperative in NAD Head:  Normocephalic and atraumatic. Neck:  Supple; no masses or thyromegaly. Lungs:  Clear throughout to auscultation, normal respiratory effort.    Heart:  +S1, +S2, Regular rate and rhythm, No edema. Abdomen:  Soft, nontender and nondistended. Normal bowel sounds, without guarding, and without rebound.   Neurologic:  Alert and  oriented x4;  grossly normal neurologically.  Impression/Plan: Bonnie Ochoa is here for a colonoscopy to be performed for average risk screening and EGD for variceal screening. Pt ate solid food at 5 pm yesterday. States output this morning is watery yellow with no stool. Possibility of a poor prep and need to abort procedure or repeat a colonoscopy within the year discussed and options of rescheduling today discussed. Pt would like to proceed with the procedure today knowing this possibility.   Risks, benefits, limitations, and alternatives regarding the procedures have been reviewed with the patient.  Questions have been answered.  All parties agreeable.   Virgel Manifold, MD  03/11/2019, 8:16  AM   

## 2019-03-12 ENCOUNTER — Encounter: Payer: Self-pay | Admitting: Gastroenterology

## 2019-03-12 NOTE — Telephone Encounter (Signed)
Today was a telephone appt.  She states doing well, had colonoscopy done today.  Has had several appts with med changes for her diabetes, she is aware of how to take her meds and her wife helps her to keep them straight.  She has all her medications and verified them.  She denies any chest pain, shortness of breath, headaches or dizziness.  She has her sleep more under control and been driving.  She walks when can in her neighborhool.  She tries to stay active.  Watches her diet with help of family.  Denies swelling and her weight has been staying in 190's.  Will continue to visit for heart failure.    [place] [phone]

## 2019-03-13 LAB — SURGICAL PATHOLOGY

## 2019-03-19 ENCOUNTER — Encounter (HOSPITAL_COMMUNITY): Payer: Medicare Other

## 2019-03-26 ENCOUNTER — Encounter: Payer: Self-pay | Admitting: Gastroenterology

## 2019-03-31 ENCOUNTER — Other Ambulatory Visit: Payer: Self-pay

## 2019-03-31 ENCOUNTER — Ambulatory Visit (INDEPENDENT_AMBULATORY_CARE_PROVIDER_SITE_OTHER): Payer: Medicare Other | Admitting: Psychiatry

## 2019-03-31 ENCOUNTER — Encounter: Payer: Self-pay | Admitting: Psychiatry

## 2019-03-31 DIAGNOSIS — F410 Panic disorder [episodic paroxysmal anxiety] without agoraphobia: Secondary | ICD-10-CM | POA: Diagnosis not present

## 2019-03-31 DIAGNOSIS — F1210 Cannabis abuse, uncomplicated: Secondary | ICD-10-CM

## 2019-03-31 DIAGNOSIS — F3132 Bipolar disorder, current episode depressed, moderate: Secondary | ICD-10-CM

## 2019-03-31 DIAGNOSIS — I200 Unstable angina: Secondary | ICD-10-CM

## 2019-03-31 DIAGNOSIS — F4312 Post-traumatic stress disorder, chronic: Secondary | ICD-10-CM

## 2019-03-31 DIAGNOSIS — F5105 Insomnia due to other mental disorder: Secondary | ICD-10-CM

## 2019-03-31 NOTE — Progress Notes (Signed)
Virtual Visit via Telephone Note  I connected with Bonnie Ochoa on 03/31/19 at  2:15 PM EDT by telephone and verified that I am speaking with the correct person using two identifiers.   I discussed the limitations, risks, security and privacy concerns of performing an evaluation and management service by telephone and the availability of in person appointments. I also discussed with the patient that there may be a patient responsible charge related to this service. The patient expressed understanding and agreed to proceed.   I discussed the assessment and treatment plan with the patient. The patient was provided an opportunity to ask questions and all were answered. The patient agreed with the plan and demonstrated an understanding of the instructions.   The patient was advised to call back or seek an in-person evaluation if the symptoms worsen or if the condition fails to improve as anticipated.   [place] MD OP Progress Note  03/31/2019 6:17 PM Bonnie Ochoa  MRN:  [id number]  Chief Complaint:  Chief Complaint    Follow-up     HPI: Bonnie Ochoa is a 53 year old African-American female, engaged, lives in [place], has a history of PTSD, bipolar disorder, panic disorder, cannabis use disorder, insomnia, OSA on CPAP, RLS, diabetes, cirrhosis of liver, chronic renal failure, congestive heart failure, recent cataract surgery, hypertension was evaluated by telemedicine today.  A video call was initiated however patient preferred to do a phone call.  Patient today reports she is currently doing okay on the current medication regimen except for her sleep.  She reports sleep continues to be restless.  She continues to take Ambien at bedtime.  She also take risperidone.  She reports she does not have any side effects to her medications at this time.  Patient continues to smoke cannabis on and off.  Provided substance abuse counseling.  Patient denies any suicidality, homicidality or perceptual  disturbances.  Patient denies any other concerns today.  She reports she continues to have good support system from her wife and her sister Visit Diagnosis:    ICD-10-CM   1. Bipolar disorder, current episode depressed, moderate ([place])  F31.32   2. Chronic post-traumatic stress disorder (PTSD)  F43.12   3. Panic disorder  F41.0   4. Cannabis use disorder, mild, abuse  F12.10   5. Insomnia due to mental condition  F51.05     Past Psychiatric History: I have reviewed past psychiatric history from my progress note on 10/16/2017.  Past Medical History:  Past Medical History:  Diagnosis Date  . Abdominal pain    a. 05/2018 HIDA scan wnl.  . ADHD   . AICD (automatic cardioverter/defibrillator) present   . Anemia   . Arthritis   . Asthma   . Bipolar 1 disorder ([place])   . Chest pain    a. Hx of cath in [place] - reportedly nl; b. 04/2018 MV: EF 22%, fixed dist ant septal, apical, and inferoapical defects - ? scar vs. attenuation. No ischemia.  . CHF (congestive heart failure) ([place])   . Cirrhosis of liver ([place])   . Coronary artery disease   . Depression   . Diabetes mellitus without complication ([place])   . Diverticulitis   . Dysrhythmia   . Heart murmur   . HFrEF (heart failure with reduced ejection fraction) ([place])    a. 06/2017 Echo: EF 20-25%, diff HK. Mildly dil LA; b. 05/2018 Echo: EF 25-30%, diff HK, Gr2 DD. Triv AI. Mod MR. Mildly reduced RV fxn. Mod-Sev TR. PASP 45-34mHg.  .Marland Kitchen  Hypertension   . Hypothyroidism   . IBS (irritable bowel syndrome)   . Insomnia   . Migraines   . NICM (nonischemic cardiomyopathy) ([place])    a. EF prev 25%; b. 11/2014 s/p SJM Fortify Assura, single lead AICD (ser# [id number]); c. 06/2017 Echo: EF 20-25%; d. 05/2018 Echo: EF 25-30%, diff HK, Gr2 DD; e. 05/2018 Cath: Nl cors. LVEDP 31mHg, PCWP 336mg. PA 65/40 (52). CO/CI 3.04/1.56.  . OSA (obstructive sleep apnea)    CPAP is broken  . PTSD (post-traumatic stress disorder)   . Restless leg syndrome   . Vertigo      Past Surgical History:  Procedure Laterality Date  . ABDOMINAL HYSTERECTOMY    . CARDIAC CATHETERIZATION    . CATARACT EXTRACTION W/PHACO Right 01/27/2019   Procedure: CATARACT EXTRACTION PHACO AND INTRAOCULAR LENS PLACEMENT ([place] BLUE RIGHT DIABETES;  Surgeon: PoBirder RobsonMD;  [facility] Service: Op[facility];  Laterality: Right;  Diabetes - insulin sleep apnea  . COLONOSCOPY WITH PROPOFOL N/A 03/11/2019   Procedure: COLONOSCOPY WITH PROPOFOL;  Surgeon: TaVirgel ManifoldMD;  Location: [facility];  Service: Endoscopy;  Laterality: N/A;  . CORONARY ARTERY BYPASS GRAFT     Pt denies  . ESOPHAGOGASTRODUODENOSCOPY (EGD) WITH PROPOFOL N/A 03/11/2019   Procedure: ESOPHAGOGASTRODUODENOSCOPY (EGD) WITH PROPOFOL;  Surgeon: TaVirgel ManifoldMD;  Location: [facility];  Service: Endoscopy;  Laterality: N/A;  . INSERT / REPLACE / REMOVE PACEMAKER     ICD  . INSERTION OF ICD  2016   St Jude Single chamber ICD  . RIGHT/LEFT HEART CATH AND CORONARY ANGIOGRAPHY N/A 06/09/2018   Procedure: RIGHT/LEFT HEART CATH AND CORONARY ANGIOGRAPHY;  Surgeon: ArWellington HampshireMD;  [facility];  Service: Cardiovascular;  Laterality: N/A;  . TONSILLECTOMY    . TUBAL LIGATION  1980    Family Psychiatric History: I have reviewed family psychiatric history from my progress note on 10/16/2017.  Family History:  Family History  Problem Relation Age of Onset  . Hypertension Mother   . Hyperlipidemia Mother   . Heart disease Mother   . Hypertension Sister   . Asthma Sister   . Heart disease Sister   . Diabetes Sister   . Cancer Sister   . Alzheimer's disease Maternal Grandfather   . Hyperlipidemia Brother   . Asthma Sister   . Hypertension Sister   . Diabetes Sister     Social History: I have reviewed social history from my progress note on 10/16/2017. Social History   Socioeconomic History  . Marital status: Married    Spouse name:  Not on file  . Number of children: 2  . Years of education: Not on file  . Highest education level: High school graduate  Occupational History    Comment: disabled  Social Needs  . Financial resource strain: Not hard at all  . Food insecurity    Worry: Sometimes true    Inability: Sometimes true  . Transportation needs    Medical: Yes    Non-medical: Yes  Tobacco Use  . Smoking status: Former Smoker    Types: Cigarettes    Quit date: 1985    Years since quitting: 35.6  . Smokeless tobacco: Never Used  . Tobacco comment: quit over  20 years ago   Substance and Sexual Activity  . Alcohol use: Not Currently  . Drug use: Not Currently    Frequency: 1.0 times per week    Types: Marijuana  .  Sexual activity: Yes    Birth control/protection: Surgical    Comment: one partner  Lifestyle  . Physical activity    Days per week: 0 days    Minutes per session: 0 min  . Stress: Very much  Relationships  . Social Herbalist on phone: Never    Gets together: Twice a week    Attends religious service: Never    Active member of club or organization: No    Attends meetings of clubs or organizations: Never    Relationship status: Married  Other Topics Concern  . Not on file  Social History Narrative   Volunteers occasionally     Allergies:  Allergies  Allergen Reactions  . Levothyroxine Rash    Metabolic Disorder Labs: Lab Results  Component Value Date   HGBA1C 13.9 (H) 02/23/2019   MPG 185.77 05/08/2018   MPG >398 07/03/2017   No results found for: PROLACTIN Lab Results  Component Value Date   CHOL 303 (H) 11/03/2018   TRIG 1,246 (HH) 11/03/2018   HDL 24 (L) 11/03/2018   CHOLHDL 5.4 05/09/2018   VLDL 53 (H) 05/09/2018   [place] Comment 11/03/2018   LDLCALC 84 05/09/2018   Lab Results  Component Value Date   TSH 1.651 10/24/2018   TSH 1.560 02/19/2018    Therapeutic Level Labs: No results found for: LITHIUM No results found for: VALPROATE No  components found for:  CBMZ  Current Medications: Current Outpatient Medications  Medication Sig Dispense Refill  . atorvastatin (LIPITOR) 80 MG tablet Take 1 tablet (80 mg total) by mouth daily at 6 PM. 90 tablet 1  . blood glucose meter kit and supplies KIT Dispense based on patient and insurance preference. Use up to four times daily as directed. (FOR ICD-9 250.00, 250.01). 1 each 0  . carvedilol (COREG) 12.5 MG tablet Take 1 tablet (12.5 mg total) by mouth 2 (two) times daily. 180 tablet 3  . cyclobenzaprine (FLEXERIL) 5 MG tablet Take 5 mg by mouth 3 (three) times daily as needed for muscle spasms.    Marland Kitchen dicyclomine (BENTYL) 20 MG tablet TAKE 1 TABLET (20 MG TOTAL) BY MOUTH 3 (THREE) TIMES DAILY AS NEEDED FOR SPASMS. (Patient not taking: Reported on 03/11/2019) 270 tablet 0  . digoxin (LANOXIN) 0.125 MG tablet Take 1 tablet (0.125 mg total) by mouth daily. 90 tablet 3  . Dulaglutide (TRULICITY) 2.24 MG/5.0IB SOPN Inject 0.75 mg into the skin once a week. 4 pen 3  . Dulaglutide (TRULICITY) 1.5 BC/4.8GQ SOPN Inject 1.5 mg into the skin once a week. 4 pen 3  . DUREZOL 0.05 % EMUL     . empagliflozin (JARDIANCE) 25 MG TABS tablet Take 25 mg by mouth daily. (Patient not taking: Reported on 03/11/2019) 90 tablet 1  . famotidine (PEPCID) 40 MG tablet TAKE 1 TABLET BY MOUTH EVERY DAY IN THE EVENING (Patient not taking: Reported on 03/11/2019) 90 tablet 1  . furosemide (LASIX) 40 MG tablet Take 1 tablet (40 mg total) by mouth daily. 30 tablet 6  . ILEVRO 0.3 % ophthalmic suspension     . Insulin Glargine (LANTUS SOLOSTAR) 100 UNIT/ML Solostar Pen Inject 20 Units into the skin daily at 10 pm. 5 pen 12  . Insulin Pen Needle 32G X 6 MM MISC 1 each by Does not apply route daily. 100 each 12  . lamoTRIgine (LAMICTAL) 25 MG tablet Take 3 tablets (75 mg total) by mouth as directed. Take 25 mg in the AM  and 50 mg PM 270 tablet 1  . metoCLOPramide (REGLAN) 10 MG tablet Take 1 tablet (10 mg total) by mouth 3  (three) times daily with meals. 90 tablet 1  . nitroGLYCERIN (NITROSTAT) 0.4 MG SL tablet Place 1 tablet (0.4 mg total) under the tongue every 5 (five) minutes as needed for chest pain. (Patient not taking: Reported on 03/10/2019) 30 tablet 0  . pantoprazole (PROTONIX) 40 MG tablet TAKE 1 TABLET BY MOUTH EVERY DAY 90 tablet 3  . PARoxetine (PAXIL) 30 MG tablet Take 1 tablet (30 mg total) by mouth daily. 90 tablet 1  . potassium chloride SA (K-DUR,KLOR-CON) 20 MEQ tablet Take 2 tablets (40 mEq total) by mouth 2 (two) times daily. 360 tablet 1  . risperiDONE (RISPERDAL) 1 MG tablet Take 1.5 tablets (1.5 mg total) by mouth at bedtime. 135 tablet 0  . sacubitril-valsartan (ENTRESTO) 49-51 MG Take 1 tablet by mouth 2 (two) times daily. 180 tablet 1  . spironolactone (ALDACTONE) 25 MG tablet Take 25 mg by mouth daily.    . sucralfate (CARAFATE) 1 g tablet TAKE 1 TABLET (1 G TOTAL) BY MOUTH 4 (FOUR) TIMES DAILY. 360 tablet 3  . zolpidem (AMBIEN) 5 MG tablet Take 1 tablet (5 mg total) by mouth at bedtime as needed for sleep. 30 tablet 1   No current facility-administered medications for this visit.      Musculoskeletal: Strength & Muscle Tone: UTA Gait & Station: UTA Patient leans: N/A  Psychiatric Specialty Exam: Review of Systems  Psychiatric/Behavioral: The patient has insomnia.   All other systems reviewed and are negative.   There were no vitals taken for this visit.There is no height or weight on file to calculate BMI.  General Appearance: UTA  Eye Contact:  UTA  Speech:  Clear and Coherent  Volume:  Normal  Mood:  Euthymic  Affect:  UTA  Thought Process:  Goal Directed and Descriptions of Associations: Intact  Orientation:  Full (Time, Place, and Person)  Thought Content: Logical   Suicidal Thoughts:  No  Homicidal Thoughts:  No  Memory:  Immediate;   Fair Recent;   Fair Remote;   Fair  Judgement:  Fair  Insight:  Fair  Psychomotor Activity:  UTA  Concentration:   Concentration: Fair and Attention Span: Fair  Recall:  [employer] of Knowledge: Fair  Language: Fair  Akathisia:  No  Handed:  Right  AIMS (if indicated): denies tremors, rigidity  Assets:  Communication Skills Desire for Improvement Social Support  ADL's:  Intact  Cognition: WNL  Sleep:  Restless   Screenings: GAD-7     Office Visit from 12/28/2016 in [facility]  Total GAD-7 Score  21    PHQ2-9     Patient Outreach Telephone from 06/16/2018 in [facility] Office Visit from 05/08/2018 in [place] Office Visit from 04/23/2018 in [place] from 02/19/2018 in [facility] Office Visit from 12/24/2017 in [facility]  PHQ-2 Total Score  0  1  0  2  0  PHQ-9 Total Score  -  -  -  7  -       Assessment and Plan: Bonnie Ochoa is a 53 year old African-American female who has a history of bipolar disorder, PTSD, panic attacks, cannabis use disorder, congestive heart failure, liver disease, chronic renal failure, diabetes melitis, OSA, hypertension, RLS was evaluated by telemedicine today.  Patient continues to struggle with sleep issues.  Her mood symptoms are improving.  Plan as noted below.  Plan Bipolar disorder-improving Risperidone 1.5 mg p.o. nightly Lamotrigine 75 mg p.o. daily in divided dosage Paxil 30 mg p.o. daily   For PTSD-stable Paxil as prescribed  For panic disorder-stable Paxil as prescribed  For insomnia-some progress Discussed sleep hygiene techniques.  Patient to continue Ambien 5 mg p.o. nightly as needed CPAP for OSA She will reach out to [place] if she continues to struggle with sleep issues.  For cannabis use disorder-mild abuse-improving Provided substance abuse counseling.  She is cutting back.  Follow-up in clinic in 1 month or sooner if needed.  September 30 at 4:15 PM  I have  spent atleast 15 minutes non face to face with patient today. More than 50 % of the time was spent for psychoeducation and supportive psychotherapy and care coordination.  This note was generated in part or whole with voice recognition software. Voice recognition is usually quite accurate but there are transcription errors that can and very often do occur. I apologize for any typographical errors that were not detected and corrected.      [name], MD 03/31/2019, 6:17 PM

## 2019-03-31 NOTE — Progress Notes (Signed)
error 

## 2019-04-07 ENCOUNTER — Telehealth: Payer: Medicare Other

## 2019-04-10 ENCOUNTER — Ambulatory Visit (INDEPENDENT_AMBULATORY_CARE_PROVIDER_SITE_OTHER): Payer: Medicare Other | Admitting: Pharmacist

## 2019-04-10 DIAGNOSIS — E1122 Type 2 diabetes mellitus with diabetic chronic kidney disease: Secondary | ICD-10-CM | POA: Diagnosis not present

## 2019-04-10 DIAGNOSIS — Z794 Long term (current) use of insulin: Secondary | ICD-10-CM | POA: Diagnosis not present

## 2019-04-10 DIAGNOSIS — I5022 Chronic systolic (congestive) heart failure: Secondary | ICD-10-CM | POA: Diagnosis not present

## 2019-04-10 DIAGNOSIS — N183 Chronic kidney disease, stage 3 (moderate): Secondary | ICD-10-CM | POA: Diagnosis not present

## 2019-04-10 NOTE — Chronic Care Management (AMB) (Signed)
Chronic Care Management   Follow Up Note   04/10/2019 Name: Bonnie Ochoa MRN: [id number] DOB: Dec 14, 1965  Referred by: Valerie Roys, DO Reason for referral : Chronic Care Management (Medication Management)   Bonnie Ochoa is a 53 y.o. year old female who is a primary care patient of Valerie Roys, DO. The CCM team was consulted for assistance with chronic disease management and care coordination needs.    Contacted patient for medication management support. Spoke with her and her wife, [name].   Review of patient status, including review of consultants reports, relevant laboratory and other test results, and collaboration with appropriate care team members and the patient's provider was performed as part of comprehensive patient evaluation and provision of chronic care management services.    SDOH (Social Determinants of Health) screening performed today: Physical Activity. See Care Plan for related entries.   Outpatient Encounter Medications as of 04/10/2019  Medication Sig  . Insulin Glargine (LANTUS SOLOSTAR) 100 UNIT/ML Solostar Pen Inject 20 Units into the skin daily at 10 pm.  . atorvastatin (LIPITOR) 80 MG tablet Take 1 tablet (80 mg total) by mouth daily at 6 PM.  . blood glucose meter kit and supplies KIT Dispense based on patient and insurance preference. Use up to four times daily as directed. (FOR ICD-9 250.00, 250.01).  . carvedilol (COREG) 12.5 MG tablet Take 1 tablet (12.5 mg total) by mouth 2 (two) times daily.  . cyclobenzaprine (FLEXERIL) 5 MG tablet Take 5 mg by mouth 3 (three) times daily as needed for muscle spasms.  Marland Kitchen dicyclomine (BENTYL) 20 MG tablet TAKE 1 TABLET (20 MG TOTAL) BY MOUTH 3 (THREE) TIMES DAILY AS NEEDED FOR SPASMS. (Patient not taking: Reported on 03/11/2019)  . digoxin (LANOXIN) 0.125 MG tablet Take 1 tablet (0.125 mg total) by mouth daily.  . Dulaglutide (TRULICITY) 1.5 VZ/5.6LO SOPN Inject 1.5 mg into the skin once a week.  . DUREZOL 0.05 %  EMUL   . empagliflozin (JARDIANCE) 25 MG TABS tablet Take 25 mg by mouth daily. (Patient not taking: Reported on 03/11/2019)  . famotidine (PEPCID) 40 MG tablet TAKE 1 TABLET BY MOUTH EVERY DAY IN THE EVENING (Patient not taking: Reported on 03/11/2019)  . furosemide (LASIX) 40 MG tablet Take 1 tablet (40 mg total) by mouth daily.  . ILEVRO 0.3 % ophthalmic suspension   . Insulin Pen Needle 32G X 6 MM MISC 1 each by Does not apply route daily.  Marland Kitchen lamoTRIgine (LAMICTAL) 25 MG tablet Take 3 tablets (75 mg total) by mouth as directed. Take 25 mg in the AM and 50 mg PM  . metoCLOPramide (REGLAN) 10 MG tablet Take 1 tablet (10 mg total) by mouth 3 (three) times daily with meals.  . nitroGLYCERIN (NITROSTAT) 0.4 MG SL tablet Place 1 tablet (0.4 mg total) under the tongue every 5 (five) minutes as needed for chest pain. (Patient not taking: Reported on 03/10/2019)  . pantoprazole (PROTONIX) 40 MG tablet TAKE 1 TABLET BY MOUTH EVERY DAY  . PARoxetine (PAXIL) 30 MG tablet Take 1 tablet (30 mg total) by mouth daily.  . potassium chloride SA (K-DUR,KLOR-CON) 20 MEQ tablet Take 2 tablets (40 mEq total) by mouth 2 (two) times daily.  . risperiDONE (RISPERDAL) 1 MG tablet Take 1.5 tablets (1.5 mg total) by mouth at bedtime.  . sacubitril-valsartan (ENTRESTO) 49-51 MG Take 1 tablet by mouth 2 (two) times daily.  Marland Kitchen spironolactone (ALDACTONE) 25 MG tablet Take 25 mg by mouth daily.  . sucralfate (  CARAFATE) 1 g tablet TAKE 1 TABLET (1 G TOTAL) BY MOUTH 4 (FOUR) TIMES DAILY.  Marland Kitchen zolpidem (AMBIEN) 5 MG tablet Take 1 tablet (5 mg total) by mouth at bedtime as needed for sleep.  . [DISCONTINUED] Dulaglutide (TRULICITY) 2.99 ME/2.6ST SOPN Inject 0.75 mg into the skin once a week.   No facility-administered encounter medications on file as of 04/10/2019.      Goals Addressed            This Visit's Progress     Patient Stated   . "We need to work on diabetes" (pt-stated)       Current Barriers:  . Diabetes:  uncontrolled; most recent A1c 13.9%, worsened from 8.3%. Concurrent HFrEF w/ AICD, followed by Dr. Rockey Situ; bipolar disorder followed by Dr. Shea Evans; Cirrhosis followed by Dr. Bonna Gains o Wife notes that she and her sister fill the patient's pill box o Wife notes that they have cut back on purchasing juice and sodas, and have tried to encourage patient to drink more water  . Current antihyperglycemic regimen: Lantus 20 units daily (patient's wife administers), Trulicity 1.5 mg weekly o Jardiance held s/p genitourinary itching; though sugars significantly elevated at that time . Reports hyperglycemic symptoms, including, polydipsia, nocturia, . Current meal patterns: o Breakfast: Sometimes skips; egg, [place] sausage, toast; coffee + splenda  o Lunch: Often skips lunch o Supper: Wife cooks; [place], Loss adjuster, chartered, vegables  o Snacks: None  o Drinks: Stopped drinking juice;  . Current exercise: Active up and down stairs, walks her dog when it isn't hot outside . Current blood glucose readings: 150-200s (after breakfast); HS: 150-200 o Patient often sleeps late and wife is already at work when she wakes up and eats breakfast, so is not checking fasting sugars  . Cardiovascular risk reduction: o Current hypertensive/CHF regimen: carvedilol 12.5 mg BID, digoxin 0.125 mg daily, Entresto 49/51 mg BID, spironolactone 25 mg daily  o Current hyperlipidemia regimen: atorvastatin 20 mg daily; LDL unable to be calculated on last check 10/2018 d/t significantly elevated triglycerides, but was 84 on last check 04/2018; not at goal <70  Pharmacist Clinical Goal(s):  Marland Kitchen Over the next 90 days, patient with work with PharmD and primary care provider to address optimized diabetes management  Interventions: . Confirmed Trulicity and insulin dosing.  . Educated patient's wife on goal A1c, goal fasting, and goal post prandial readings. Asked if some fasting readings could be obtained in the next few days before f/u with Dr.  Wynetta Emery. Requested she bring these readings to the appointment. Wife expressed understanding, and noted that she would try to attend the appointment as well  . As patient is more normoglycemic, she likely would have less glucosuria with SGLT2 therapy and tolerate better. SGLT2 therapy would be recommended with concurrent CHF. Consider restarting Jardiance, or restarting with 1/2 of previously prescription for 25 mg daily for ~a week to assess tolerability. Educated wife on HF benefit of Jardiance.   Patient Self Care Activities:  . Patient will check blood glucose BID, document, and provide at future appointments . Patient will take medications as prescribed  Please see past updates related to this goal by clicking on the "Past Updates" button in the selected goal         Plan:  - Will outreach patient in 4-5 weeks for continued medication management support  Catie Darnelle Maffucci, PharmD Clinical Pharmacist [place] [phone]

## 2019-04-10 NOTE — Patient Instructions (Signed)
Visit Information  Goals Addressed            This Visit's Progress     Patient Stated   . "We need to work on diabetes" (pt-stated)       Current Barriers:  . Diabetes: uncontrolled; most recent A1c 13.9%, worsened from 8.3%. Concurrent HFrEF w/ AICD, followed by Dr. Rockey Situ; bipolar disorder followed by Dr. Shea Evans; Cirrhosis followed by Dr. Bonna Gains o Wife notes that she and her sister fill the patient's pill box o Wife notes that they have cut back on purchasing juice and sodas, and have tried to encourage patient to drink more water  . Current antihyperglycemic regimen: Lantus 20 units daily (patient's wife administers), Trulicity 1.5 mg weekly o Jardiance held s/p genitourinary itching; though sugars significantly elevated at that time . Reports hyperglycemic symptoms, including, polydipsia, nocturia, . Current meal patterns: o Breakfast: Sometimes skips; egg, [place] sausage, toast; coffee + splenda  o Lunch: Often skips lunch o Supper: Wife cooks; [place], Loss adjuster, chartered, vegables  o Snacks: None  o Drinks: Stopped drinking juice;  . Current exercise: Active up and down stairs, walks her dog when it isn't hot outside . Current blood glucose readings: 150-200s (after breakfast); HS: 150-200 o Patient often sleeps late and wife is already at work when she wakes up and eats breakfast, so is not checking fasting sugars  . Cardiovascular risk reduction: o Current hypertensive/CHF regimen: carvedilol 12.5 mg BID, digoxin 0.125 mg daily, Entresto 49/51 mg BID, spironolactone 25 mg daily  o Current hyperlipidemia regimen: atorvastatin 20 mg daily; LDL unable to be calculated on last check 10/2018 d/t significantly elevated triglycerides, but was 84 on last check 04/2018; not at goal <70  Pharmacist Clinical Goal(s):  Marland Kitchen Over the next 90 days, patient with work with PharmD and primary care provider to address optimized diabetes management  Interventions: . Confirmed Trulicity and insulin dosing.   . Educated patient's wife on goal A1c, goal fasting, and goal post prandial readings. Asked if some fasting readings could be obtained in the next few days before f/u with Dr. Wynetta Emery. Requested she bring these readings to the appointment. Wife expressed understanding, and noted that she would try to attend the appointment as well  . As patient is more normoglycemic, she likely would have less glucosuria with SGLT2 therapy and tolerate better. SGLT2 therapy would be recommended with concurrent CHF. Consider restarting Jardiance, or restarting with 1/2 of previously prescription for 25 mg daily for ~a week to assess tolerability. Educated wife on HF benefit of Jardiance.   Patient Self Care Activities:  . Patient will check blood glucose BID, document, and provide at future appointments . Patient will take medications as prescribed  Please see past updates related to this goal by clicking on the "Past Updates" button in the selected goal         The patient verbalized understanding of instructions provided today and declined a print copy of patient instruction materials.   Plan:  - Will outreach patient in 4-5 weeks for continued medication management support  Catie Darnelle Maffucci, PharmD Clinical Pharmacist [place] [phone]

## 2019-04-11 ENCOUNTER — Other Ambulatory Visit: Payer: Self-pay | Admitting: Family Medicine

## 2019-04-13 ENCOUNTER — Encounter: Payer: Self-pay | Admitting: Family Medicine

## 2019-04-13 ENCOUNTER — Other Ambulatory Visit: Payer: Self-pay

## 2019-04-13 ENCOUNTER — Other Ambulatory Visit: Payer: Self-pay | Admitting: Psychiatry

## 2019-04-13 ENCOUNTER — Ambulatory Visit (INDEPENDENT_AMBULATORY_CARE_PROVIDER_SITE_OTHER): Payer: Medicare Other | Admitting: Family Medicine

## 2019-04-13 ENCOUNTER — Telehealth (HOSPITAL_COMMUNITY): Payer: Self-pay

## 2019-04-13 VITALS — BP 118/84 | HR 84 | Temp 98.2°F | Ht 65.0 in | Wt 191.0 lb

## 2019-04-13 DIAGNOSIS — N183 Chronic kidney disease, stage 3 unspecified: Secondary | ICD-10-CM

## 2019-04-13 DIAGNOSIS — I200 Unstable angina: Secondary | ICD-10-CM

## 2019-04-13 DIAGNOSIS — Z794 Long term (current) use of insulin: Secondary | ICD-10-CM

## 2019-04-13 DIAGNOSIS — F5105 Insomnia due to other mental disorder: Secondary | ICD-10-CM

## 2019-04-13 DIAGNOSIS — Z23 Encounter for immunization: Secondary | ICD-10-CM

## 2019-04-13 DIAGNOSIS — Z1239 Encounter for other screening for malignant neoplasm of breast: Secondary | ICD-10-CM

## 2019-04-13 DIAGNOSIS — F4312 Post-traumatic stress disorder, chronic: Secondary | ICD-10-CM

## 2019-04-13 DIAGNOSIS — E1122 Type 2 diabetes mellitus with diabetic chronic kidney disease: Secondary | ICD-10-CM | POA: Diagnosis not present

## 2019-04-13 DIAGNOSIS — F3132 Bipolar disorder, current episode depressed, moderate: Secondary | ICD-10-CM

## 2019-04-13 LAB — MICROALBUMIN, URINE WAIVED
Creatinine, Urine Waived: 50 mg/dL (ref 10–300)
Microalb, Ur Waived: 30 mg/L — ABNORMAL HIGH (ref 0–19)

## 2019-04-13 MED ORDER — EMPAGLIFLOZIN 25 MG PO TABS: 25.0000 mg | ORAL_TABLET | ORAL | 1 refills | Status: DC

## 2019-04-13 NOTE — Progress Notes (Signed)
BP 118/84   Pulse 84   Temp 98.2 F (36.8 C) (Oral)   Ht 5\' 5"  (1.651 m)   Wt 191 lb (86.6 kg)   SpO2 97%   BMI 31.78 kg/m    Subjective:    Patient ID: Bonnie Ochoa, female    DOB: 1965/12/09, 53 y.o.   MRN: [id number]  HPI: Bonnie MornCheryl Speedy is a 53 y.o. female  Chief Complaint  Patient presents with  . Diabetes   DIABETES Hypoglycemic episodes:no Polydipsia/polyuria: no Visual disturbance: no Chest pain: no Paresthesias: no Glucose Monitoring: yes  Accucheck frequency: occasionally Taking Insulin?: no Blood Pressure Monitoring: not checking Retinal Examination: Up to Date Foot Exam: Up to Date Diabetic Education: Completed Pneumovax: Up to Date Influenza: Up to Date Aspirin: no  Relevant past medical, surgical, family and social history reviewed and updated as indicated. Interim medical history since our last visit reviewed. Allergies and medications reviewed and updated.  Review of Systems  Constitutional: Negative.   Respiratory: Negative.   Cardiovascular: Negative.   Psychiatric/Behavioral: Negative.     Per HPI unless specifically indicated above    Objective:    BP 118/84   Pulse 84   Temp 98.2 F (36.8 C) (Oral)   Ht 5\' 5"  (1.651 m)   Wt 191 lb (86.6 kg)   SpO2 97%   BMI 31.78 kg/m   Wt Readings from Last 3 Encounters:  04/13/19 191 lb (86.6 kg)  03/11/19 194 lb (88 kg)  03/10/19 194 lb (88 kg)    Physical Exam Vitals signs and nursing note reviewed.  Constitutional:      General: She is not in acute distress.    Appearance: Normal appearance. She is not ill-appearing, toxic-appearing or diaphoretic.  HENT:     Head: Normocephalic and atraumatic.     Right Ear: External ear normal.     Left Ear: External ear normal.     Nose: Nose normal.     Mouth/Throat:     Mouth: Mucous membranes are moist.     Pharynx: Oropharynx is clear.  Eyes:     General: No scleral icterus.       Right eye: No discharge.        Left eye: No  discharge.     Extraocular Movements: Extraocular movements intact.     Conjunctiva/sclera: Conjunctivae normal.     Pupils: Pupils are equal, round, and reactive to light.  Neck:     Musculoskeletal: Normal range of motion and neck supple.  Cardiovascular:     Rate and Rhythm: Normal rate and regular rhythm.     Pulses: Normal pulses.     Heart sounds: Normal heart sounds. No murmur. No friction rub. No gallop.   Pulmonary:     Effort: Pulmonary effort is normal. No respiratory distress.     Breath sounds: Normal breath sounds. No stridor. No wheezing, rhonchi or rales.  Chest:     Chest wall: No tenderness.  Musculoskeletal: Normal range of motion.  Skin:    General: Skin is warm and dry.     Capillary Refill: Capillary refill takes less than 2 seconds.     Coloration: Skin is not jaundiced or pale.     Findings: No bruising, erythema, lesion or rash.  Neurological:     General: No focal deficit present.     Mental Status: She is alert and oriented to person, place, and time. Mental status is at baseline.  Psychiatric:  Mood and Affect: Mood normal.        Behavior: Behavior normal.        Thought Content: Thought content normal.        Judgment: Judgment normal.     Results for orders placed or performed during the hospital encounter of 03/11/19  Glucose, capillary  Result Value Ref Range   Glucose-Capillary 201 (H) 70 - 99 mg/dL  Surgical pathology  Result Value Ref Range   SURGICAL PATHOLOGY      Surgical Pathology CASE: [id number] PATIENT: Ailyn Sciarra Surgical Pathology Report     SPECIMEN SUBMITTED: A. Stomach, thickened folds; cbx B. GEJ, r/o Barrett's; cbx  CLINICAL HISTORY: None provided  PRE-OPERATIVE DIAGNOSIS: Screening colonoscopy, cirrhosis  POST-OPERATIVE DIAGNOSIS: Thickened gastric fold, external hemorrhoids, diverticulosis, poor prep     DIAGNOSIS: A.  STOMACH, THICKENED FOLDS; BIOPSY: - ANTRAL MUCOSA WITH FOCAL HEALING  EROSIVE GASTRITIS AND FOVEOLAR HYPERPLASIA CONSISTENT WITH REACTIVE GASTRITIS. - NEGATIVE FOR H. PYLORI, DYSPLASIA, AND MALIGNANCY. - IHC STAIN FOR PANCYTOKERATIN FAILS TO IDENTIFY A SUBTLE INFILTRATIVE PROCESS.  B.  GEJ; COLD BIOPSY: - REFLUX GASTROESOPHAGITIS. - NEGATIVE FOR INTESTINAL METAPLASIA, DYSPLASIA, AND MALIGNANCY.  IHC slides were prepared by Sammuel Hines, [place]. All controls stained appropriately.  This test was developed and its performance characteristics determined by [facility]. It has n ot been cleared or approved by the [place] Food and Drug Administration. The FDA does not require this test to go through premarket FDA review. This test is used for clinical purposes. It should not be regarded as investigational or for research. This laboratory is certified under the Clinical Laboratory Improvement Amendments (CLIA) as qualified to perform high complexity clinical laboratory testing.   GROSS DESCRIPTION: A. Labeled: C BX thickened gastric folds Received: Formalin Tissue fragment(s): 1 Size: 0.5 cm Description: Tan soft tissue fragment Entirely submitted in 1 cassette.  B. Labeled: C BX GEJ rule out Barrett's Received: Formalin Tissue fragment(s): Multiple Size: Aggregate, 0.6 x 0.2 x 0.1 cm Description: Tan soft tissue fragments Entirely submitted in 1 cassette.   Final Diagnosis performed by Elijah Birk, MD.   Electronically signed 03/13/2019 10:18:44AM The electronic signature indicates that the named Attending Pathologist has evaluated the spe cimen  Technical component performed at [facility], [address], [place], [place] [address] Lab: [phone] Dir: Jolene Schimke, MD, MMM  Professional component performed at [facility], [facility], [address] [place], [place], [place] [address] Lab: [phone] Dir: Georgiann Cocker. Rubinas, MD       Assessment & Plan:   Problem List Items Addressed This Visit      Endocrine   DM (diabetes mellitus),  type 2 with renal complications (HCC) - Primary    Tolerating her medicine well, but sugars still running a bit high. Will restart her jardiance every other day and recheck 1 month with goal of getting her back to daily. Watch for yeast infections. Call with any concerns.       Relevant Medications   empagliflozin (JARDIANCE) 25 MG TABS tablet   Other Relevant Orders   Basic metabolic panel   Microalbumin, Urine Waived    Other Visit Diagnoses    Flu vaccine need       Flu shot given today.   Relevant Orders   Flu Vaccine QUAD 36+ mos IM (Completed)   Screening for breast cancer       Mammorgam ordered today.   Relevant Orders   MM DIGITAL SCREENING BILATERAL       Follow up plan:  Return in about 4 weeks (around 05/11/2019).

## 2019-04-13 NOTE — Patient Instructions (Signed)
Call to schedule your mammogram: [facility]  Address: [address], [place], [place] [address]  Phone: [phone]  

## 2019-04-13 NOTE — Telephone Encounter (Signed)
Today was a telephone visit with Bonnie Ochoa.  Mostly spoke with wife who takes care of her meds, meals and appts.  She states she has been doing good, trying to get her sugars under control and they keep switching her meds around.  She denies any chest pain, shortness of breath, headaches or dizziness.  She has had cataract surgery that went well.  She is sleeping better nad more active.  She has no swelling.  She denies abdominal tightness.  She weighs daily and staying around 191 lbs.  She watches her diet and fluids.  Appetite is good.  Will continue to visit for heart failure.   [place] [phone]

## 2019-04-13 NOTE — Assessment & Plan Note (Signed)
Tolerating her medicine well, but sugars still running a bit high. Will restart her jardiance every other day and recheck 1 month with goal of getting her back to daily. Watch for yeast infections. Call with any concerns.

## 2019-04-14 LAB — BASIC METABOLIC PANEL
BUN/Creatinine Ratio: 16 (ref 9–23)
BUN: 20 mg/dL (ref 6–24)
CO2: 25 mmol/L (ref 20–29)
Calcium: 10.3 mg/dL — ABNORMAL HIGH (ref 8.7–10.2)
Chloride: 98 mmol/L (ref 96–106)
Creatinine, Ser: 1.28 mg/dL — ABNORMAL HIGH (ref 0.57–1.00)
GFR calc Af Amer: 54 mL/min/{1.73_m2} — ABNORMAL LOW (ref >59)
GFR calc non Af Amer: 47 mL/min/{1.73_m2} — ABNORMAL LOW (ref >59)
Glucose: 255 mg/dL — ABNORMAL HIGH (ref 65–99)
Potassium: 4.4 mmol/L (ref 3.5–5.2)
Sodium: 137 mmol/L (ref 134–144)

## 2019-05-08 ENCOUNTER — Other Ambulatory Visit: Payer: Self-pay | Admitting: Family Medicine

## 2019-05-11 ENCOUNTER — Telehealth (HOSPITAL_COMMUNITY): Payer: Self-pay

## 2019-05-11 NOTE — Telephone Encounter (Signed)
Today was a telephone appt with Eleonore and wife.  She has been doing well.  Been making her appts and aware of up coming appts. She has all her medications and knows how to take them.  She has been for several months doing well.  Will be discharging her from the program.  They are both aware to call if any problems arise and I can always add her back.  She watches her diet and fluids.  They bother appear to understand.  [name] with HF clinic is aware and agrees.   [place] [phone]

## 2019-05-13 ENCOUNTER — Ambulatory Visit (INDEPENDENT_AMBULATORY_CARE_PROVIDER_SITE_OTHER): Payer: Self-pay | Admitting: Psychiatry

## 2019-05-13 ENCOUNTER — Other Ambulatory Visit: Payer: Self-pay

## 2019-05-13 DIAGNOSIS — Z5329 Procedure and treatment not carried out because of patient's decision for other reasons: Secondary | ICD-10-CM

## 2019-05-13 NOTE — Progress Notes (Signed)
No response to calls 

## 2019-05-14 ENCOUNTER — Ambulatory Visit (INDEPENDENT_AMBULATORY_CARE_PROVIDER_SITE_OTHER): Payer: Medicare Other | Admitting: Family Medicine

## 2019-05-14 ENCOUNTER — Encounter: Payer: Self-pay | Admitting: Family Medicine

## 2019-05-14 VITALS — BP 123/85 | HR 87 | Temp 97.5°F

## 2019-05-14 DIAGNOSIS — I200 Unstable angina: Secondary | ICD-10-CM

## 2019-05-14 DIAGNOSIS — E1121 Type 2 diabetes mellitus with diabetic nephropathy: Secondary | ICD-10-CM | POA: Diagnosis not present

## 2019-05-14 DIAGNOSIS — N1831 Chronic kidney disease, stage 3a: Secondary | ICD-10-CM

## 2019-05-14 DIAGNOSIS — Z794 Long term (current) use of insulin: Secondary | ICD-10-CM | POA: Diagnosis not present

## 2019-05-14 LAB — BAYER DCA HB A1C WAIVED: HB A1C (BAYER DCA - WAIVED): 12.7 % — ABNORMAL HIGH (ref <7.0)

## 2019-05-14 MED ORDER — EMPAGLIFLOZIN 25 MG PO TABS: 25.0000 mg | ORAL_TABLET | Freq: Every day | ORAL | 1 refills | Status: DC

## 2019-05-14 NOTE — Progress Notes (Signed)
BP 123/85   Pulse 87   Temp (!) 97.5 F (36.4 C)   SpO2 97%    Subjective:    Patient ID: Bonnie Ochoa, female    DOB: [birth year], 53 y.o.   MRN: [id number]  HPI: Bonnie Ochoa is a 53 y.o. female  Chief Complaint  Patient presents with  . Diabetes   DIABETES Hypoglycemic episodes:no Polydipsia/polyuria: no Visual disturbance: no Chest pain: no Paresthesias: no Glucose Monitoring: no  Accucheck frequency: Not Checking Taking Insulin?: yes Blood Pressure Monitoring: not checking Retinal Examination: Up to Date Foot Exam: Up to Date Diabetic Education: Completed Pneumovax: Up to Date Influenza: Up to Date Aspirin: no   Relevant past medical, surgical, family and social history reviewed and updated as indicated. Interim medical history since our last visit reviewed. Allergies and medications reviewed and updated.  Review of Systems  Constitutional: Negative.   Respiratory: Negative.   Cardiovascular: Negative.   Neurological: Negative.   Psychiatric/Behavioral: Negative.     Per HPI unless specifically indicated above     Objective:    BP 123/85   Pulse 87   Temp (!) 97.5 F (36.4 C)   SpO2 97%   Wt Readings from Last 3 Encounters:  04/13/19 191 lb (86.6 kg)  03/11/19 194 lb (88 kg)  03/10/19 194 lb (88 kg)    Physical Exam Vitals signs and nursing note reviewed.  Constitutional:      General: She is not in acute distress.    Appearance: Normal appearance. She is not ill-appearing, toxic-appearing or diaphoretic.  HENT:     Head: Normocephalic and atraumatic.     Right Ear: External ear normal.     Left Ear: External ear normal.     Nose: Nose normal.     Mouth/Throat:     Mouth: Mucous membranes are moist.     Pharynx: Oropharynx is clear.  Eyes:     General: No scleral icterus.       Right eye: No discharge.        Left eye: No discharge.     Extraocular Movements: Extraocular movements intact.     Conjunctiva/sclera: Conjunctivae  normal.     Pupils: Pupils are equal, round, and reactive to light.  Neck:     Musculoskeletal: Normal range of motion and neck supple.  Cardiovascular:     Rate and Rhythm: Normal rate and regular rhythm.     Pulses: Normal pulses.     Heart sounds: Normal heart sounds. No murmur. No friction rub. No gallop.   Pulmonary:     Effort: Pulmonary effort is normal. No respiratory distress.     Breath sounds: Normal breath sounds. No stridor. No wheezing, rhonchi or rales.  Chest:     Chest wall: No tenderness.  Musculoskeletal: Normal range of motion.  Skin:    General: Skin is warm and dry.     Capillary Refill: Capillary refill takes less than 2 seconds.     Coloration: Skin is not jaundiced or pale.     Findings: No bruising, erythema, lesion or rash.  Neurological:     General: No focal deficit present.     Mental Status: She is alert and oriented to person, place, and time. Mental status is at baseline.  Psychiatric:        Mood and Affect: Mood normal.        Behavior: Behavior normal.        Thought Content: Thought content normal.  Judgment: Judgment normal.     Results for orders placed or performed in visit on 05/39/76  Basic metabolic panel  Result Value Ref Range   Glucose 255 (H) 65 - 99 mg/dL   BUN 20 6 - 24 mg/dL   Creatinine, Ser 1.28 (H) 0.57 - 1.00 mg/dL   GFR calc non Af Amer 47 (L) >59 mL/min/1.73   GFR calc Af Amer 54 (L) >59 mL/min/1.73   BUN/Creatinine Ratio 16 9 - 23   Sodium 137 134 - 144 mmol/L   Potassium 4.4 3.5 - 5.2 mmol/L   Chloride 98 96 - 106 mmol/L   CO2 25 20 - 29 mmol/L   Calcium 10.3 (H) 8.7 - 10.2 mg/dL  Microalbumin, Urine Waived  Result Value Ref Range   Microalb, Ur Waived 30 (H) 0 - 19 mg/L   Creatinine, Urine Waived 50 10 - 300 mg/dL   Microalb/Creat Ratio 30-300 (H) <30 mg/g      Assessment & Plan:   Problem List Items Addressed This Visit      Endocrine   DM (diabetes mellitus), type 2 with renal complications  (HCC) - Primary    Improved with A1c of 12.6 down from 13.9- increase jardiance to daily and recheck 3 months. Continue other medicines. Call with any concerns.       Relevant Medications   empagliflozin (JARDIANCE) 25 MG TABS tablet   Other Relevant Orders   Bayer DCA Hb A1c Waived       Follow up plan: Return in about 3 months (around 08/14/2019).

## 2019-05-14 NOTE — Assessment & Plan Note (Signed)
Improved with A1c of 12.6 down from 13.9- increase jardiance to daily and recheck 3 months. Continue other medicines. Call with any concerns.

## 2019-05-18 ENCOUNTER — Ambulatory Visit (INDEPENDENT_AMBULATORY_CARE_PROVIDER_SITE_OTHER): Payer: Medicare Other

## 2019-05-18 ENCOUNTER — Other Ambulatory Visit: Payer: Self-pay

## 2019-05-18 VITALS — BP 108/72 | HR 81 | Temp 98.0°F | Resp 15 | Ht 65.0 in | Wt 190.6 lb

## 2019-05-18 DIAGNOSIS — Z0000 Encounter for general adult medical examination without abnormal findings: Secondary | ICD-10-CM

## 2019-05-18 NOTE — Patient Instructions (Signed)
Bonnie Ochoa , Thank you for taking time to come for your Medicare Wellness Visit. I appreciate your ongoing commitment to your health goals. Please review the following plan we discussed and let me know if I can assist you in the future.   Screening recommendations/referrals: Colonoscopy: completed 03/11/2019 Mammogram: due now, Please call [phone] to schedule your mammogram.  Bone Density: not indicated  Recommended yearly ophthalmology/optometry visit for glaucoma screening and checkup Recommended yearly dental visit for hygiene and checkup  Vaccinations: Influenza vaccine: up to date Pneumococcal vaccine: up to date Tdap vaccine: up to date Shingles vaccine: shingrix eligible     Advanced directives: Advance directive discussed with you today. I have provided a copy for you to complete at home and have notarized. Once this is complete please bring a copy in to our office so we can scan it into your chart.  Conditions/risks identified: diabetic, in contact with chronic care management team   Next appointment: follow up in one year for your annual wellness visit.   Preventive Care 40-64 Years, Female Preventive care refers to lifestyle choices and visits with your health care provider that can promote health and wellness. What does preventive care include?  A yearly physical exam. This is also called an annual well check.  Dental exams once or twice a year.  Routine eye exams. Ask your health care provider how often you should have your eyes checked.  Personal lifestyle choices, including:  Daily care of your teeth and gums.  Regular physical activity.  Eating a healthy diet.  Avoiding tobacco and drug use.  Limiting alcohol use.  Practicing safe sex.  Taking low-dose aspirin daily starting at age 42.  Taking vitamin and mineral supplements as recommended by your health care provider. What happens during an annual well check? The services and screenings done by  your health care provider during your annual well check will depend on your age, overall health, lifestyle risk factors, and family history of disease. Counseling  Your health care provider may ask you questions about your:  Alcohol use.  Tobacco use.  Drug use.  Emotional well-being.  Home and relationship well-being.  Sexual activity.  Eating habits.  Work and work Statistician.  Method of birth control.  Menstrual cycle.  Pregnancy history. Screening  You may have the following tests or measurements:  Height, weight, and BMI.  Blood pressure.  Lipid and cholesterol levels. These may be checked every 5 years, or more frequently if you are over 56 years old.  Skin check.  Lung cancer screening. You may have this screening every year starting at age 28 if you have a 30-pack-year history of smoking and currently smoke or have quit within the past 15 years.  Fecal occult blood test (FOBT) of the stool. You may have this test every year starting at age 44.  Flexible sigmoidoscopy or colonoscopy. You may have a sigmoidoscopy every 5 years or a colonoscopy every 10 years starting at age 90.  Hepatitis C blood test.  Hepatitis B blood test.  Sexually transmitted disease (STD) testing.  Diabetes screening. This is done by checking your blood sugar (glucose) after you have not eaten for a while (fasting). You may have this done every 1-3 years.  Mammogram. This may be done every 1-2 years. Talk to your health care provider about when you should start having regular mammograms. This may depend on whether you have a family history of breast cancer.  BRCA-related cancer screening. This may be done  if you have a family history of breast, ovarian, tubal, or peritoneal cancers.  Pelvic exam and Pap test. This may be done every 3 years starting at age 14. Starting at age 77, this may be done every 5 years if you have a Pap test in combination with an HPV test.  Bone density  scan. This is done to screen for osteoporosis. You may have this scan if you are at high risk for osteoporosis. Discuss your test results, treatment options, and if necessary, the need for more tests with your health care provider. Vaccines  Your health care provider may recommend certain vaccines, such as:  Influenza vaccine. This is recommended every year.  Tetanus, diphtheria, and acellular pertussis (Tdap, Td) vaccine. You may need a Td booster every 10 years.  Zoster vaccine. You may need this after age 23.  Pneumococcal 13-valent conjugate (PCV13) vaccine. You may need this if you have certain conditions and were not previously vaccinated.  Pneumococcal polysaccharide (PPSV23) vaccine. You may need one or two doses if you smoke cigarettes or if you have certain conditions. Talk to your health care provider about which screenings and vaccines you need and how often you need them. This information is not intended to replace advice given to you by your health care provider. Make sure you discuss any questions you have with your health care provider. Document Released: 08/26/2015 Document Revised: 04/18/2016 Document Reviewed: 05/31/2015 Elsevier Interactive Patient Education  2017 [place] Prevention in the Home Falls can cause injuries. They can happen to people of all ages. There are many things you can do to make your home safe and to help prevent falls. What can I do on the outside of my home?  Regularly fix the edges of walkways and driveways and fix any cracks.  Remove anything that might make you trip as you walk through a door, such as a raised step or threshold.  Trim any bushes or trees on the path to your home.  Use bright outdoor lighting.  Clear any walking paths of anything that might make someone trip, such as rocks or tools.  Regularly check to see if handrails are loose or broken. Make sure that both sides of any steps have handrails.  Any raised  decks and porches should have guardrails on the edges.  Have any leaves, snow, or ice cleared regularly.  Use sand or salt on walking paths during winter.  Clean up any spills in your garage right away. This includes oil or grease spills. What can I do in the bathroom?  Use night lights.  Install grab bars by the toilet and in the tub and shower. Do not use towel bars as grab bars.  Use non-skid mats or decals in the tub or shower.  If you need to sit down in the shower, use a plastic, non-slip stool.  Keep the floor dry. Clean up any water that spills on the floor as soon as it happens.  Remove soap buildup in the tub or shower regularly.  Attach bath mats securely with double-sided non-slip rug tape.  Do not have throw rugs and other things on the floor that can make you trip. What can I do in the bedroom?  Use night lights.  Make sure that you have a light by your bed that is easy to reach.  Do not use any sheets or blankets that are too big for your bed. They should not hang down onto the floor.  Have a firm chair that has side arms. You can use this for support while you get dressed.  Do not have throw rugs and other things on the floor that can make you trip. What can I do in the kitchen?  Clean up any spills right away.  Avoid walking on wet floors.  Keep items that you use a lot in easy-to-reach places.  If you need to reach something above you, use a strong step stool that has a grab bar.  Keep electrical cords out of the way.  Do not use floor polish or wax that makes floors slippery. If you must use wax, use non-skid floor wax.  Do not have throw rugs and other things on the floor that can make you trip. What can I do with my stairs?  Do not leave any items on the stairs.  Make sure that there are handrails on both sides of the stairs and use them. Fix handrails that are broken or loose. Make sure that handrails are as long as the stairways.  Check  any carpeting to make sure that it is firmly attached to the stairs. Fix any carpet that is loose or worn.  Avoid having throw rugs at the top or bottom of the stairs. If you do have throw rugs, attach them to the floor with carpet tape.  Make sure that you have a light switch at the top of the stairs and the bottom of the stairs. If you do not have them, ask someone to add them for you. What else can I do to help prevent falls?  Wear shoes that:  Do not have high heels.  Have rubber bottoms.  Are comfortable and fit you well.  Are closed at the toe. Do not wear sandals.  If you use a stepladder:  Make sure that it is fully opened. Do not climb a closed stepladder.  Make sure that both sides of the stepladder are locked into place.  Ask someone to hold it for you, if possible.  Clearly mark and make sure that you can see:  Any grab bars or handrails.  First and last steps.  Where the edge of each step is.  Use tools that help you move around (mobility aids) if they are needed. These include:  Canes.  Walkers.  Scooters.  Crutches.  Turn on the lights when you go into a dark area. Replace any light bulbs as soon as they burn out.  Set up your furniture so you have a clear path. Avoid moving your furniture around.  If any of your floors are uneven, fix them.  If there are any pets around you, be aware of where they are.  Review your medicines with your doctor. Some medicines can make you feel dizzy. This can increase your chance of falling. Ask your doctor what other things that you can do to help prevent falls. This information is not intended to replace advice given to you by your health care provider. Make sure you discuss any questions you have with your health care provider. Document Released: 05/26/2009 Document Revised: 01/05/2016 Document Reviewed: 09/03/2014 Elsevier Interactive Patient Education  2017 [employer].

## 2019-05-18 NOTE — Progress Notes (Signed)
Subjective:   Bonnie Ochoa is a 53 y.o. female who presents for Medicare Annual (Subsequent) preventive examination.  Review of Systems:   Cardiac Risk Factors include: diabetes mellitus;dyslipidemia;hypertension     Objective:     Vitals: BP 108/72 (BP Location: Left Arm, Patient Position: Sitting, Cuff Size: Normal)   Pulse 81   Temp 98 F (36.7 C) (Oral)   Resp 15   Ht 5' 5"  (1.651 m)   Wt 190 lb 9.6 oz (86.5 kg)   BMI 31.72 kg/m   Body mass index is 31.72 kg/m.  Advanced Directives 03/11/2019 01/27/2019 10/25/2018 10/24/2018 09/04/2018 06/19/2018 06/16/2018  Does Patient Have a Medical Advance Directive? No No No No No No No  Type of Advance Directive - - - - - - -  Does patient want to make changes to medical advance directive? - - - - - - Yes (MAU/Ambulatory/Procedural Areas - Information given)  Copy of [place] in Chart? - - - - - - -  Would patient like information on creating a medical advance directive? No - Patient declined No - Patient declined Yes (Inpatient - patient requests chaplain consult to create a medical advance directive) No - Patient declined - No - Patient declined -  Some encounter information is confidential and restricted. Go to Review Flowsheets activity to see all data.    Tobacco Social History   Tobacco Use  Smoking Status Former Smoker  . Types: Cigarettes  . Quit date: 22  . Years since quitting: 35.7  Smokeless Tobacco Never Used  Tobacco Comment   quit over  20 years ago      Counseling given: Not Answered Comment: quit over  20 years ago    Clinical Intake:  Pre-visit preparation completed: Yes  Pain : No/denies pain     Nutritional Risks: None Diabetes: Yes CBG done?: No Did pt. bring in CBG monitor from home?: No  How often do you need to have someone help you when you read instructions, pamphlets, or other written materials from your doctor or pharmacy?: 1 - Never  Nutrition Risk Assessment:   Has the patient had any N/V/D within the last 2 months?  No  Does the patient have any non-healing wounds?  No  Has the patient had any unintentional weight loss or weight gain?  No   Diabetes:  Is the patient diabetic?  Yes  If diabetic, was a CBG obtained today?  No  Did the patient bring in their glucometer from home?  No  How often do you monitor your CBG's? As needed.   Financial Strains and Diabetes Management:  Are you having any financial strains with the device, your supplies or your medication? No .  Does the patient want to be seen by Chronic Care Management for management of their diabetes?  No  Would the patient like to be referred to a Nutritionist or for Diabetic Management?  No   Diabetic Exams:  Diabetic Eye Exam: Completed 06/23/2018.   Diabetic Foot Exam: Completed 11/03/2018.    Interpreter Needed?: No  Information entered by :: Ajahni Nay,LPN  Past Medical History:  Diagnosis Date  . Abdominal pain    a. 05/2018 HIDA scan wnl.  . ADHD   . AICD (automatic cardioverter/defibrillator) present   . Anemia   . Arthritis   . Asthma   . Bipolar 1 disorder ([place])   . Chest pain    a. Hx of cath in [place] - reportedly nl; b.  04/2018 MV: EF 22%, fixed dist ant septal, apical, and inferoapical defects - ? scar vs. attenuation. No ischemia.  . CHF (congestive heart failure) ([place])   . Cirrhosis of liver ([place])   . Coronary artery disease   . Depression   . Diabetes mellitus without complication ([place])   . Diverticulitis   . Dysrhythmia   . Heart murmur   . HFrEF (heart failure with reduced ejection fraction) ([place])    a. 06/2017 Echo: EF 20-25%, diff HK. Mildly dil LA; b. 05/2018 Echo: EF 25-30%, diff HK, Gr2 DD. Triv AI. Mod MR. Mildly reduced RV fxn. Mod-Sev TR. PASP 45-46mHg.  .Marland KitchenHypertension   . Hypothyroidism   . IBS (irritable bowel syndrome)   . Insomnia   . Migraines   . NICM (nonischemic cardiomyopathy) ([place]    a. EF prev 25%; b. 11/2014 s/p SJM  [employer], single lead AICD (ser# [id number]; c. 06/2017 Echo: EF 20-25%; d. 05/2018 Echo: EF 25-30%, diff HK, Gr2 DD; e. 05/2018 Cath: Nl cors. LVEDP 247mg, PCWP 3243m. PA 65/40 (52). CO/CI 3.04/1.56.  . OSA (obstructive sleep apnea)    CPAP is broken  . PTSD (post-traumatic stress disorder)   . Restless leg syndrome   . Vertigo    Past Surgical History:  Procedure Laterality Date  . ABDOMINAL HYSTERECTOMY    . CARDIAC CATHETERIZATION    . CATARACT EXTRACTION W/PHACO Right 01/27/2019   Procedure: CATARACT EXTRACTION PHACO AND INTRAOCULAR LENS PLACEMENT ([place] DIABET[place];  Surgeon: PorBirder RobsonD;  [facility];  Laterality: Right;  Diabetes - insulin sleep apnea  . COLONOSCOPY WITH PROPOFOL N/A 03/11/2019   Procedure: COLONOSCOPY WITH PROPOFOL;  Surgeon: TahVirgel ManifoldD;  Location: [facility] ENDOSCOPY;  Service: Endoscopy;  Laterality: N/A;  . CORONARY ARTERY BYPASS GRAFT     Pt denies  . ESOPHAGOGASTRODUODENOSCOPY (EGD) WITH PROPOFOL N/A 03/11/2019   Procedure: ESOPHAGOGASTRODUODENOSCOPY (EGD) WITH PROPOFOL;  Surgeon: TahVirgel ManifoldD;  Location: [facility] ENDOSCOPY;  Service: Endoscopy;  Laterality: N/A;  . INSERT / REPLACE / REMOVE PACEMAKER     ICD  . INSERTION OF ICD  2016   St Jude Single chamber ICD  . RIGHT/LEFT HEART CATH AND CORONARY ANGIOGRAPHY N/A 06/09/2018   Procedure: RIGHT/LEFT HEART CATH AND CORONARY ANGIOGRAPHY;  Surgeon: AriWellington HampshireD;  [facility];  Service: Cardiovascular;  Laterality: N/A;  . TONSILLECTOMY    . TUBAL LIGATION  1980   Family History  Problem Relation Age of Onset  . Hypertension Mother   . Hyperlipidemia Mother   . Heart disease Mother   . Hypertension Sister   . Asthma Sister   . Heart disease Sister   . Diabetes Sister   . Cancer Sister   . Alzheimer's disease Maternal Grandfather   . Hyperlipidemia Brother   . Asthma Sister   .  Hypertension Sister   . Diabetes Sister    Social History   Socioeconomic History  . Marital status: Married    Spouse name: Not on file  . Number of children: 2  . Years of education: Not on file  . Highest education level: High school graduate  Occupational History    Comment: disabled  Social Needs  . Financial resource strain: Not hard at all  . Food insecurity    Worry: Sometimes true    Inability: Sometimes true  . Transportation needs    Medical: Yes    Non-medical: Yes  Tobacco Use  . Smoking status: Former Smoker    Types: Cigarettes    Quit date: 1985    Years since quitting: 35.7  . Smokeless tobacco: Never Used  . Tobacco comment: quit over  20 years ago   Substance and Sexual Activity  . Alcohol use: Not Currently  . Drug use: Not Currently    Frequency: 1.0 times per week    Types: Marijuana  . Sexual activity: Yes    Birth control/protection: Surgical    Comment: one partner  Lifestyle  . Physical activity    Days per week: 0 days    Minutes per session: 0 min  . Stress: Very much  Relationships  . Social Herbalist on phone: Never    Gets together: Twice a week    Attends religious service: Never    Active member of club or organization: No    Attends meetings of clubs or organizations: Never    Relationship status: Married  Other Topics Concern  . Not on file  Social History Narrative   Volunteers occasionally     Outpatient Encounter Medications as of 05/18/2019  Medication Sig  . atorvastatin (LIPITOR) 80 MG tablet TAKE 1 TABLET (80 MG TOTAL) BY MOUTH DAILY AT 6 PM.  . carvedilol (COREG) 12.5 MG tablet Take 1 tablet (12.5 mg total) by mouth 2 (two) times daily.  . digoxin (LANOXIN) 0.125 MG tablet Take 1 tablet (0.125 mg total) by mouth daily.  . Dulaglutide (TRULICITY) 1.5 WU/9.8JX SOPN Inject 1.5 mg into the skin once a week.  . DUREZOL 0.05 % EMUL   . empagliflozin (JARDIANCE) 25 MG TABS tablet Take 25 mg by mouth daily.  .  famotidine (PEPCID) 40 MG tablet TAKE 1 TABLET BY MOUTH EVERY DAY IN THE EVENING  . furosemide (LASIX) 40 MG tablet Take 1 tablet (40 mg total) by mouth daily.  . ILEVRO 0.3 % ophthalmic suspension   . Insulin Glargine (LANTUS SOLOSTAR) 100 UNIT/ML Solostar Pen Inject 20 Units into the skin daily at 10 pm.  . Insulin Pen Needle 32G X 6 MM MISC 1 each by Does not apply route daily.  Marland Kitchen lamoTRIgine (LAMICTAL) 25 MG tablet TAKE 1 TABLET BY MOUTH TWICE A DAY  . metoCLOPramide (REGLAN) 10 MG tablet Take 1 tablet (10 mg total) by mouth 3 (three) times daily with meals.  . pantoprazole (PROTONIX) 40 MG tablet TAKE 1 TABLET BY MOUTH EVERY DAY  . PARoxetine (PAXIL) 30 MG tablet Take 1 tablet (30 mg total) by mouth daily.  . potassium chloride SA (K-DUR,KLOR-CON) 20 MEQ tablet Take 2 tablets (40 mEq total) by mouth 2 (two) times daily.  . risperiDONE (RISPERDAL) 1 MG tablet Take 1.5 tablets (1.5 mg total) by mouth at bedtime.  . sacubitril-valsartan (ENTRESTO) 49-51 MG Take 1 tablet by mouth 2 (two) times daily.  Marland Kitchen spironolactone (ALDACTONE) 25 MG tablet Take 25 mg by mouth daily.  . sucralfate (CARAFATE) 1 g tablet TAKE 1 TABLET (1 G TOTAL) BY MOUTH 4 (FOUR) TIMES DAILY.  Marland Kitchen zolpidem (AMBIEN) 5 MG tablet TAKE 1 TABLET (5 MG TOTAL) BY MOUTH AT BEDTIME AS NEEDED FOR SLEEP.  Marland Kitchen blood glucose meter kit and supplies KIT Dispense based on patient and insurance preference. Use up to four times daily as directed. (FOR ICD-9 250.00, 250.01). (Patient not taking: Reported on 05/18/2019)  . cyclobenzaprine (FLEXERIL) 5 MG tablet Take 5 mg by mouth 3 (three) times daily as needed for muscle spasms.  Marland Kitchen  dicyclomine (BENTYL) 20 MG tablet TAKE 1 TABLET (20 MG TOTAL) BY MOUTH 3 (THREE) TIMES DAILY AS NEEDED FOR SPASMS. (Patient not taking: Reported on 05/18/2019)  . nitroGLYCERIN (NITROSTAT) 0.4 MG SL tablet Place 1 tablet (0.4 mg total) under the tongue every 5 (five) minutes as needed for chest pain. (Patient not taking:  Reported on 05/18/2019)   No facility-administered encounter medications on file as of 05/18/2019.     Activities of Daily Living In your present state of health, do you have any difficulty performing the following activities: 05/18/2019 01/27/2019  Hearing? N N  Comment no hearing aids -  Vision? N N  Comment eyeglasses, [facility] -  Difficulty concentrating or making decisions? Y N  Walking or climbing stairs? N N  Dressing or bathing? N N  Doing errands, shopping? N -  Preparing Food and eating ? N -  Using the Toilet? N -  In the past six months, have you accidently leaked urine? N -  Do you have problems with loss of bowel control? N -  Managing your Medications? N -  Managing your Finances? N -  Housekeeping or managing your Housekeeping? N -  Some recent data might be hidden    Patient Care Team: Valerie Roys, DO as PCP - General (Family Medicine) Rockey Situ Kathlene November, MD as PCP - Cardiology (Cardiology) [name], MD as Consulting Physician (Gastroenterology) Rockey Situ, Kathlene November, MD as Consulting Physician (Cardiology) Alisa Graff, FNP as Nurse Practitioner (Family Medicine) De Hollingshead, [facility] as Pharmacist (Pharmacist)    Assessment:   This is a routine wellness examination for [place].  Exercise Activities and Dietary recommendations Current Exercise Habits: Home exercise routine, Type of exercise: walking, Time (Minutes): 15, Frequency (Times/Week): 5, Weekly Exercise (Minutes/Week): 75, Intensity: Mild, Exercise limited by: None identified  Goals    . "We need to work on diabetes" (pt-stated)     Current Barriers:  . Diabetes: uncontrolled; most recent A1c 13.9%, worsened from 8.3%. Concurrent HFrEF w/ AICD, followed by Dr. Rockey Situ; bipolar disorder followed by Dr. Shea Evans; Cirrhosis followed by Dr. Bonna Gains o Wife notes that she and her sister fill the patient's pill box o Wife notes that they have cut back on purchasing juice and sodas, and  have tried to encourage patient to drink more water  . Current antihyperglycemic regimen: Lantus 20 units daily (patient's wife administers), Trulicity 1.5 mg weekly o Jardiance held s/p genitourinary itching; though sugars significantly elevated at that time . Reports hyperglycemic symptoms, including, polydipsia, nocturia, . Current meal patterns: o Breakfast: Sometimes skips; egg, [place] sausage, toast; coffee + splenda  o Lunch: Often skips lunch o Supper: Wife cooks; [place], Loss adjuster, chartered, vegables  o Snacks: None  o Drinks: Stopped drinking juice;  . Current exercise: Active up and down stairs, walks her dog when it isn't hot outside . Current blood glucose readings: 150-200s (after breakfast); HS: 150-200 o Patient often sleeps late and wife is already at work when she wakes up and eats breakfast, so is not checking fasting sugars  . Cardiovascular risk reduction: o Current hypertensive/CHF regimen: carvedilol 12.5 mg BID, digoxin 0.125 mg daily, Entresto 49/51 mg BID, spironolactone 25 mg daily  o Current hyperlipidemia regimen: atorvastatin 20 mg daily; LDL unable to be calculated on last check 10/2018 d/t significantly elevated triglycerides, but was 84 on last check 04/2018; not at goal <70  Pharmacist Clinical Goal(s):  Marland Kitchen Over the next 90 days, patient with work with PharmD and primary  care provider to address optimized diabetes management  Interventions: . Confirmed Trulicity and insulin dosing.  . Educated patient's wife on goal A1c, goal fasting, and goal post prandial readings. Asked if some fasting readings could be obtained in the next few days before f/u with Dr. Wynetta Emery. Requested she bring these readings to the appointment. Wife expressed understanding, and noted that she would try to attend the appointment as well  . As patient is more normoglycemic, she likely would have less glucosuria with SGLT2 therapy and tolerate better. SGLT2 therapy would be recommended with concurrent  CHF. Consider restarting Jardiance, or restarting with 1/2 of previously prescription for 25 mg daily for ~a week to assess tolerability. Educated wife on HF benefit of Jardiance.   Patient Self Care Activities:  . Patient will check blood glucose BID, document, and provide at future appointments . Patient will take medications as prescribed  Please see past updates related to this goal by clicking on the "Past Updates" button in the selected goal      . DIET - INCREASE WATER INTAKE     Recommend drinking at least 6-8 glasses of water a day        Fall Risk: Fall Risk  05/18/2019 07/17/2018 07/02/2018 06/26/2018 06/18/2018  Falls in the past year? 0 (No Data) 0 (No Data) 1  Comment - Denies new/ recent falls - Denies new/ recent falls -  Number falls in past yr: 0 - 0 - 0  Injury with Fall? 0 - 0 - 0  Risk for fall due to : - - - - -  Follow up - Falls prevention discussed - - -    FALL RISK PREVENTION PERTAINING TO THE HOME:  Any stairs in or around the home? Yes  If so, are there any without handrails? No   Home free of loose throw rugs in walkways, pet beds, electrical cords, etc? Yes  Adequate lighting in your home to reduce risk of falls? Yes   ASSISTIVE DEVICES UTILIZED TO PREVENT FALLS:  Life alert? No  Use of a cane, walker or w/c? No  Grab bars in the bathroom? No  Shower chair or bench in shower? No  Elevated toilet seat or a handicapped toilet? No   TIMED UP AND GO:  Was the test performed? Yes .  Length of time to ambulate 10 feet: 10 sec.   GAIT:  Appearance of gait: Gait steady and fast without the use of an assistive device.  Education: Fall risk prevention has been discussed.  Intervention(s) required? No   DME/home health order needed?  No    Depression Screen PHQ 2/9 Scores 05/18/2019 06/16/2018 05/08/2018 04/23/2018  PHQ - 2 Score 1 0 1 0  PHQ- 9 Score - - - -     Cognitive Function     6CIT Screen 05/18/2019 02/19/2018  What Year? 0 points 0  points  What month? 0 points 0 points  What time? 0 points 0 points  Count back from 20 0 points 0 points  Months in reverse 0 points 0 points  Repeat phrase 0 points 2 points  Total Score 0 2    Immunization History  Administered Date(s) Administered  . Hepatitis B, adult 11/03/2018  . Influenza,inj,Quad PF,6+ Mos 05/31/2016, 05/02/2017, 07/04/2017, 04/07/2018, 06/13/2018, 04/13/2019  . Pneumococcal Polysaccharide-23 07/04/2017  . Td 04/07/2018    Qualifies for Shingles Vaccine? Yes  Zostavax completed n/a. Due for Shingrix. Education has been provided regarding the importance of this vaccine. Pt  has been advised to call insurance company to determine out of pocket expense. Advised may also receive vaccine at local pharmacy or Health Dept. Verbalized acceptance and understanding.  Tdap: up to date   Flu Vaccine: up to date   Pneumococcal Vaccine: up to date   Screening Tests Health Maintenance  Topic Date Due  . PAP SMEAR-Modifier  12/20/1984  . MAMMOGRAM  12/20/2013  . OPHTHALMOLOGY EXAM  06/24/2019  . FOOT EXAM  11/03/2019  . HEMOGLOBIN A1C  11/12/2019  . URINE MICROALBUMIN  04/12/2020  . TETANUS/TDAP  04/07/2028  . COLONOSCOPY  03/10/2029  . INFLUENZA VACCINE  Completed  . PNEUMOCOCCAL POLYSACCHARIDE VACCINE AGE 59-64 HIGH RISK  Completed  . Hepatitis C Screening  Completed  . HIV Screening  Completed    Cancer Screenings:  Colorectal Screening: Completed 03/11/2019. Repeat every 10 years  Mammogram: ordered  Bone Density:not indicated   Lung Cancer Screening: (Low Dose CT Chest recommended if Age 51-80 years, 30 pack-year currently smoking OR have quit w/in 15years.) does not qualify.    Additional Screening:  Hepatitis C Screening: does qualify; Completed 05/30/2018  Dental Screening: Recommended annual dental exams for proper oral hygiene   Community Resource Referral:  CRR required this visit?  No       Plan:  I have personally reviewed and  addressed the Medicare Annual Wellness questionnaire and have noted the following in the patient's chart:  A. Medical and social history B. Use of alcohol, tobacco or illicit drugs  C. Current medications and supplements D. Functional ability and status E.  Nutritional status F.  Physical activity G. Advance directives H. List of other physicians I.  Hospitalizations, surgeries, and ER visits in previous 12 months J.  [place] such as hearing and vision if needed, cognitive and depression L. Referrals and appointments   In addition, I have reviewed and discussed with patient certain preventive protocols, quality metrics, and best practice recommendations. A written personalized care plan for preventive services as well as general preventive health recommendations were provided to patient.   Signed,    Bevelyn Ngo, LPN  [id number] Nurse Health Advisor   Nurse Notes: none

## 2019-05-27 ENCOUNTER — Other Ambulatory Visit: Payer: Self-pay | Admitting: Family Medicine

## 2019-05-28 ENCOUNTER — Ambulatory Visit: Payer: Medicare Other | Admitting: Nurse Practitioner

## 2019-05-28 ENCOUNTER — Encounter: Payer: Self-pay | Admitting: Nurse Practitioner

## 2019-05-28 NOTE — Progress Notes (Deleted)
Office Visit    Patient Name: Callan Yontz Date of Encounter: 05/28/2019  Primary Care Provider:  Valerie Roys, DO Primary Cardiologist:  Ida Rogue, MD  Chief Complaint    53 year old female with a history of nonischemic cardiomyopathy, HFrEF, status post AICD, diabetes, hypertension, bipolar disorder, sleep apnea, PTSD, restless legs, and hepatic cirrhosis, who presents for follow-up related to heart failure.  Past Medical History    Past Medical History:  Diagnosis Date  . Abdominal pain    a. 05/2018 HIDA scan wnl.  . ADHD   . AICD (automatic cardioverter/defibrillator) present    a. 11/2014 s/p SJM Fortify Assura, single lead AICD (ser# [id number]).  Marland Kitchen Anemia   . Arthritis   . Asthma   . Bipolar 1 disorder ([place])   . Chest pain    a. Hx of cath in [place] - reportedly nl; b. 04/2018 MV: EF 22%, fixed dist ant septal, apical, and inferoapical defects - ? scar vs. attenuation. No ischemia.  . Cirrhosis of liver ([place])   . Coronary artery disease   . Depression   . Diabetes mellitus without complication ([place])   . Diverticulitis   . Dysrhythmia   . Heart murmur   . HFrEF (heart failure with reduced ejection fraction) ([place])    a. 06/2017 Echo: EF 20-25%, diff HK. Mildly dil LA; b. 05/2018 Echo: EF 25-30%, diff HK, Gr2 DD. Triv AI. Mod MR. Mildly reduced RV fxn. Mod-Sev TR. PASP 45-70mHg.  .Marland KitchenHypertension   . Hypothyroidism   . IBS (irritable bowel syndrome)   . Insomnia   . Migraines   . NIC[place] (nonischemic cardiomyopathy) [place]    a. EF prev 25%; b. 11/2014 s/p SJM Fortify Assura, single lead AICD (ser# [id number]; c. 06/2017 Echo: EF 20-25%; d. 05/2018 Echo: EF 25-30%, diff HK, Gr2 DD; e. 05/2018 Echo: EF 25-30%; f. 05/2018 Cath: Nl cors. LVEDP 22mg, PCWP 3259m. PA 65/40 (52). CO/CI 3.04/1.56; c. 07/2018 CPX: Mod HF limitation.  . OSA (obstructive sleep apnea)    CPAP is broken  . PTSD (post-traumatic stress disorder)   . Restless leg syndrome   . Vertigo    Past  Surgical History:  Procedure Laterality Date  . ABDOMINAL HYSTERECTOMY    . CARDIAC CATHETERIZATION    . CATARACT EXTRACTION W/PHACO Right 01/27/2019   Procedure: CATARACT EXTRACTION PHACO AND INTRAOCULAR LENS PLACEMENT (IOC[place]DonaldVISION BLUE RIGHT DIABETES;  Surgeon: PorBirder RobsonD;  [facility];  Laterality: Right;  Diabetes - insulin sleep apnea  . COLONOSCOPY WITH PROPOFOL N/A 03/11/2019   Procedure: COLONOSCOPY WITH PROPOFOL;  Surgeon: TahVirgel ManifoldD;  Location: [facility];  Service: Endoscopy;  Laterality: N/A;  . CORONARY ARTERY BYPASS GRAFT     Pt denies  . ESOPHAGOGASTRODUODENOSCOPY (EGD) WITH PROPOFOL N/A 03/11/2019   Procedure: ESOPHAGOGASTRODUODENOSCOPY (EGD) WITH PROPOFOL;  Surgeon: TahVirgel ManifoldD;  Location: [facility];  Service: Endoscopy;  Laterality: N/A;  . INSERT / REPLACE / REMOVE PACEMAKER     ICD  . INSERTION OF ICD  2016   St Jude Single chamber ICD  . RIGHT/LEFT HEART CATH AND CORONARY ANGIOGRAPHY N/A 06/09/2018   Procedure: RIGHT/LEFT HEART CATH AND CORONARY ANGIOGRAPHY;  Surgeon: AriWellington HampshireD;  [facility];  Service: Cardiovascular;  Laterality: N/A;  . TONSILLECTOMY    . TUBAL LIGATION  1980    Allergies  Allergies  Allergen Reactions  . Levothyroxine Rash    History  of Present Illness    53 year old female with above complex past medical history including nonischemic cardiomyopathy with an EF of 25 to 30% (05/2018), single-lead AICD placement, hypertension, diabetes, bipolar disorder, sleep apnea, PTSD, restless leg syndrome, and hepatic cirrhosis.  She was originally diagnosed with nonischemic cardiomyopathy in [place] in 2008.  She had normal heart catheterization at that time and subsequently underwent single lead AICD placement in 2016.  She relocated to [place] in 2017.  In the fall 2019, she had several admissions in the setting of initially  prerenal azotemia and subsequently heart failure.  Right and left heart cardiac catheterization October 2019 showed normal coronary arteries with elevated right heart filling pressures.  Cardiac index was 1.5.  We were able to get her back on evidence-based therapy and she was subsequently referred to heart failure clinic where she was last seen in February of this year.  CPX testing in December 2019 did show moderate heart failure related limitation of activity and it is plan for her to have a follow-up CPX before the end of this year.  Home Medications    Prior to Admission medications   Medication Sig Start Date End Date Taking? Authorizing Provider  atorvastatin (LIPITOR) 80 MG tablet TAKE 1 TABLET (80 MG TOTAL) BY MOUTH DAILY AT 6 PM. 05/08/19   Johnson, Megan P, DO  blood glucose meter kit and supplies KIT Dispense based on patient and insurance preference. Use up to four times daily as directed. (FOR ICD-9 250.00, 250.01). Patient not taking: Reported on 05/18/2019 07/24/17   Park Liter P, DO  carvedilol (COREG) 12.5 MG tablet Take 1 tablet (12.5 mg total) by mouth 2 (two) times daily. 06/03/18 09/19/19  Theora Gianotti, NP  cyclobenzaprine (FLEXERIL) 5 MG tablet Take 5 mg by mouth 3 (three) times daily as needed for muscle spasms.    [provider]  dicyclomine (BENTYL) 20 MG tablet TAKE 1 TABLET (20 MG TOTAL) BY MOUTH 3 (THREE) TIMES DAILY AS NEEDED FOR SPASMS. Patient not taking: Reported on 05/18/2019 01/17/19   Park Liter P, DO  digoxin (LANOXIN) 0.125 MG tablet Take 1 tablet (0.125 mg total) by mouth daily. 07/14/18   Clegg, Amy D, NP  Dulaglutide (TRULICITY) 1.5 TX/6.4WO SOPN Inject 1.5 mg into the skin once a week. 03/10/19   Johnson, Megan P, DO  DUREZOL 0.05 % EMUL  01/15/19   [provider]  empagliflozin (JARDIANCE) 25 MG TABS tablet Take 25 mg by mouth daily. 05/14/19   Johnson, Megan P, DO  famotidine (PEPCID) 40 MG tablet TAKE 1 TABLET BY MOUTH EVERY  DAY IN THE EVENING 11/03/18   Johnson, Megan P, DO  furosemide (LASIX) 40 MG tablet Take 1 tablet (40 mg total) by mouth daily. 12/22/18   Minna Merritts, MD  ILEVRO 0.3 % ophthalmic suspension  01/15/19   [provider]  Insulin Glargine (LANTUS SOLOSTAR) 100 UNIT/ML Solostar Pen Inject 20 Units into the skin daily at 10 pm. 11/03/18   Park Liter P, DO  Insulin Pen Needle 32G X 6 MM MISC 1 each by Does not apply route daily. 11/03/18   Johnson, Megan P, DO  KLOR-CON M20 20 MEQ tablet TAKE 2 TABLETS (40 MEQ TOTAL) BY MOUTH 2 (TWO) TIMES DAILY. 05/27/19   Johnson, Megan P, DO  lamoTRIgine (LAMICTAL) 25 MG tablet TAKE 1 TABLET BY MOUTH TWICE A DAY 04/13/19   [name], MD  metoCLOPramide (REGLAN) 10 MG tablet Take 1 tablet (10 mg total)  by mouth 3 (three) times daily with meals. 11/03/18 11/03/19  Park Liter P, DO  nitroGLYCERIN (NITROSTAT) 0.4 MG SL tablet Place 1 tablet (0.4 mg total) under the tongue every 5 (five) minutes as needed for chest pain. Patient not taking: Reported on 05/18/2019 05/19/18 05/19/19  [name], MD  pantoprazole (PROTONIX) 40 MG tablet TAKE 1 TABLET BY MOUTH EVERY DAY 09/22/18   [name], MD  PARoxetine (PAXIL) 30 MG tablet Take 1 tablet (30 mg total) by mouth daily. 01/06/19   [name], MD  risperiDONE (RISPERDAL) 1 MG tablet Take 1.5 tablets (1.5 mg total) by mouth at bedtime. 02/16/19   [name], MD  sacubitril-valsartan (ENTRESTO) 49-51 MG Take 1 tablet by mouth 2 (two) times daily. 11/03/18   Johnson, Megan P, DO  spironolactone (ALDACTONE) 25 MG tablet Take 25 mg by mouth daily. 11/16/18   [provider]  sucralfate (CARAFATE) 1 g tablet TAKE 1 TABLET (1 G TOTAL) BY MOUTH 4 (FOUR) TIMES DAILY. 07/07/18   Johnson, Megan P, DO  zolpidem (AMBIEN) 5 MG tablet TAKE 1 TABLET (5 MG TOTAL) BY MOUTH AT BEDTIME AS NEEDED FOR SLEEP. 04/13/19   [name], MD    Review of Systems    ***.  All other systems reviewed and are  otherwise negative except as noted above.  Physical Exam    VS:  There were no vitals taken for this visit. , BMI There is no height or weight on file to calculate BMI. GEN: Well nourished, well developed, in no acute distress. HEENT: normal. Neck: Supple, no JVD, carotid bruits, or masses. Cardiac: RRR, no murmurs, rubs, or gallops. No clubbing, cyanosis, edema.  Radials/DP/PT 2+ and equal bilaterally.  Respiratory:  Respirations regular and unlabored, clear to auscultation bilaterally. GI: Soft, nontender, nondistended, BS + x 4. MS: no deformity or atrophy. Skin: warm and dry, no rash. Neuro:  Strength and sensation are intact. Psych: Normal affect.  Accessory Clinical Findings    ECG personally reviewed by me today - *** - no acute changes.  Assessment & Plan    1.  ***   Murray Hodgkins, PA-C 05/28/2019, 9:28 AM

## 2019-06-02 ENCOUNTER — Other Ambulatory Visit: Payer: Self-pay

## 2019-06-02 ENCOUNTER — Ambulatory Visit (INDEPENDENT_AMBULATORY_CARE_PROVIDER_SITE_OTHER): Payer: Medicare Other | Admitting: Cardiovascular Disease

## 2019-06-02 ENCOUNTER — Encounter: Payer: Self-pay | Admitting: Cardiovascular Disease

## 2019-06-02 VITALS — BP 110/70 | HR 85 | Ht 65.0 in | Wt 191.0 lb

## 2019-06-02 DIAGNOSIS — I129 Hypertensive chronic kidney disease with stage 1 through stage 4 chronic kidney disease, or unspecified chronic kidney disease: Secondary | ICD-10-CM

## 2019-06-02 DIAGNOSIS — I200 Unstable angina: Secondary | ICD-10-CM

## 2019-06-02 DIAGNOSIS — I5022 Chronic systolic (congestive) heart failure: Secondary | ICD-10-CM | POA: Diagnosis not present

## 2019-06-02 DIAGNOSIS — K7469 Other cirrhosis of liver: Secondary | ICD-10-CM

## 2019-06-02 DIAGNOSIS — I428 Other cardiomyopathies: Secondary | ICD-10-CM | POA: Diagnosis not present

## 2019-06-02 NOTE — Patient Instructions (Signed)

## 2019-06-02 NOTE — Progress Notes (Signed)
Cardiology Office Note  Date:  06/02/2019   ID:  Bonnie Ochoa, DOB [birth year], MRN [id number]  PCP:  Valerie Roys, DO   Chief Complaint  Patient presents with  . Other    6 month follow up. Patient c.o chest pain. meds reviewed verbally with patient.     HPI:  [name] is a 53 yo female with PMH Medical and appt noncompliance, Substance abuse-chronic /marijuana bipolar,  anxiety diabetes,  hypertension,  asthma,  Obstructive sleep apnea, restless leg syndrome Nonischemic cardiomyopathy, history of ICD,  ejection fraction 25%, 05/2018 Normal coronary artery anemia Previous alcohol problem (patient denies any coronary disease on prior cardiac catheterization)  ascites, cirrhosis, History of paracentesis Previously on pain medication at home, oxycodone She presents today for follow-up of her chronic systolic CHF  In follow-up today reports that she feels relatively well Poor diet Sugars continue to run high, checking them as often as she should at home Reports having polydipsia, polyuria  Initial blood pressure was low, up to 110/70 on my recheck glucose today 397, in the office Checked at 4 PM, had [employer] with [place] fries for lunch  Denies lower extremity edema, abdominal bloating No PND orthopnea Does not do any regular exercise, Heading home today to watch movies this afternoon  EKG personally reviewed by myself on todays visit Shows normal sinus rhythm rate 85 bpm nonspecific ST abnormality anterolateral leads, 1 and aVL  Other past medical history reviewed hospitalization October 25, 2018 for renal failure CR 2.15 Felt to be secondary to dehydration and improved with IV fluids She was continued on Lasix at discharge  Previous hospitalization November 2018 for hyperglycemia nonketotic coma She had acute renal failure and hyponatremia  Hospital admission 09/10/16 numerous sx on arrival to ER chest pain, shortness of breath, abdominal pain,  dysuria, urinary frequency, syncope 2 today with lightheadedness, nausea and vomiting, and diarrhea , sob TBili 2.4, had paracentesis, diuresis D/c on lasix 60 BID  Hospital admission 09/17/16: chest pain, ABD pain fevers, chills, chest pain, shortness of breath, vomiting and diarrhea. "ran out of meds" HTN, acute on chronic systolic CHF  Seen in the emergency room 05/19/2016 for abdominal pain, chest pain BNP in the hospital 3700 acute on chronic CHF, Had 20 L diuresis   emergency room in December 2017 for nausea, vomiting, URI symptoms  CT scan consistent with moderate abdominal ascites, fatty liver Aortic atherosclerosis   PMH:   has a past medical history of Abdominal pain, ADHD, AICD (automatic cardioverter/defibrillator) present, Anemia, Arthritis, Asthma, Bipolar 1 disorder ([place]), Chest pain, Cirrhosis of liver ([place]), Coronary artery disease, Depression, Diabetes mellitus without complication ([place]), Diverticulitis, Dysrhythmia, Heart murmur, HFrEF (heart failure with reduced ejection fraction) ([place]), Hypertension, Hypothyroidism, IBS (irritable bowel syndrome), Insomnia, Migraines, NICM (nonischemic cardiomyopathy) ([place]), OSA (obstructive sleep apnea), PTSD (post-traumatic stress disorder), Restless leg syndrome, and Vertigo.  PSH:    Past Surgical History:  Procedure Laterality Date  . ABDOMINAL HYSTERECTOMY    . CARDIAC CATHETERIZATION    . CATARACT EXTRACTION W/PHACO Right 01/27/2019   Procedure: CATARACT EXTRACTION PHACO AND INTRAOCULAR LENS PLACEMENT ([place])  VISION BLUE RIGHT DIABETES;  Surgeon: Birder Robson, MD;  Location: [place] [facility];  Service: Ophthalmology;  Laterality: Right;  Diabetes - insulin sleep apnea  . COLONOSCOPY WITH PROPOFOL N/A 03/11/2019   Procedure: COLONOSCOPY WITH PROPOFOL;  Surgeon: Virgel Manifold, MD;  Location: [facility];  Service: Endoscopy;  Laterality: N/A;  . CORONARY ARTERY BYPASS GRAFT     Pt  denies  .  ESOPHAGOGASTRODUODENOSCOPY (EGD) WITH PROPOFOL N/A 03/11/2019   Procedure: ESOPHAGOGASTRODUODENOSCOPY (EGD) WITH PROPOFOL;  Surgeon: Virgel Manifold, MD;  Location: [facility];  Service: Endoscopy;  Laterality: N/A;  . INSERT / REPLACE / REMOVE PACEMAKER     ICD  . INSERTION OF ICD  2016   St Jude Single chamber ICD  . RIGHT/LEFT HEART CATH AND CORONARY ANGIOGRAPHY N/A 06/09/2018   Procedure: RIGHT/LEFT HEART CATH AND CORONARY ANGIOGRAPHY;  Surgeon: Wellington Hampshire, MD;  Location: [facility];  Service: Cardiovascular;  Laterality: N/A;  . TONSILLECTOMY    . TUBAL LIGATION  1980    Current Outpatient Medications  Medication Sig Dispense Refill  . atorvastatin (LIPITOR) 80 MG tablet TAKE 1 TABLET (80 MG TOTAL) BY MOUTH DAILY AT 6 PM. 90 tablet 1  . blood glucose meter kit and supplies KIT Dispense based on patient and insurance preference. Use up to four times daily as directed. (FOR ICD-9 250.00, 250.01). 1 each 0  . carvedilol (COREG) 12.5 MG tablet Take 1 tablet (12.5 mg total) by mouth 2 (two) times daily. 180 tablet 3  . cyclobenzaprine (FLEXERIL) 5 MG tablet Take 5 mg by mouth 3 (three) times daily as needed for muscle spasms.    Marland Kitchen dicyclomine (BENTYL) 20 MG tablet TAKE 1 TABLET (20 MG TOTAL) BY MOUTH 3 (THREE) TIMES DAILY AS NEEDED FOR SPASMS. 270 tablet 0  . digoxin (LANOXIN) 0.125 MG tablet Take 1 tablet (0.125 mg total) by mouth daily. 90 tablet 3  . Dulaglutide (TRULICITY) 1.5 HU/3.1SH SOPN Inject 1.5 mg into the skin once a week. 4 pen 3  . DUREZOL 0.05 % EMUL     . empagliflozin (JARDIANCE) 25 MG TABS tablet Take 25 mg by mouth daily. 90 tablet 1  . famotidine (PEPCID) 40 MG tablet TAKE 1 TABLET BY MOUTH EVERY DAY IN THE EVENING 90 tablet 1  . furosemide (LASIX) 40 MG tablet Take 1 tablet (40 mg total) by mouth daily. 30 tablet 6  . ILEVRO 0.3 % ophthalmic suspension     . Insulin Glargine (LANTUS SOLOSTAR) 100 UNIT/ML Solostar Pen Inject 20 Units into the  skin daily at 10 pm. 5 pen 12  . Insulin Pen Needle 32G X 6 MM MISC 1 each by Does not apply route daily. 100 each 12  . KLOR-CON M20 20 MEQ tablet TAKE 2 TABLETS (40 MEQ TOTAL) BY MOUTH 2 (TWO) TIMES DAILY. 360 tablet 1  . lamoTRIgine (LAMICTAL) 25 MG tablet TAKE 1 TABLET BY MOUTH TWICE A DAY 180 tablet 1  . metoCLOPramide (REGLAN) 10 MG tablet Take 1 tablet (10 mg total) by mouth 3 (three) times daily with meals. 90 tablet 1  . pantoprazole (PROTONIX) 40 MG tablet TAKE 1 TABLET BY MOUTH EVERY DAY 90 tablet 3  . PARoxetine (PAXIL) 30 MG tablet Take 1 tablet (30 mg total) by mouth daily. 90 tablet 1  . risperiDONE (RISPERDAL) 1 MG tablet Take 1.5 tablets (1.5 mg total) by mouth at bedtime. 135 tablet 0  . sacubitril-valsartan (ENTRESTO) 49-51 MG Take 1 tablet by mouth 2 (two) times daily. 180 tablet 1  . spironolactone (ALDACTONE) 25 MG tablet Take 25 mg by mouth daily.    . sucralfate (CARAFATE) 1 g tablet TAKE 1 TABLET (1 G TOTAL) BY MOUTH 4 (FOUR) TIMES DAILY. 360 tablet 3  . zolpidem (AMBIEN) 5 MG tablet TAKE 1 TABLET (5 MG TOTAL) BY MOUTH AT BEDTIME AS NEEDED FOR SLEEP. 30 tablet 1  .  nitroGLYCERIN (NITROSTAT) 0.4 MG SL tablet Place 1 tablet (0.4 mg total) under the tongue every 5 (five) minutes as needed for chest pain. (Patient not taking: Reported on 05/18/2019) 30 tablet 0   No current facility-administered medications for this visit.      Allergies:   Levothyroxine   Social History:  The patient  reports that she quit smoking about 35 years ago. Her smoking use included cigarettes. She has never used smokeless tobacco. She reports previous alcohol use. She reports previous drug use. Frequency: 1.00 time per week. Drug: Marijuana.   Family History:   family history includes Alzheimer's disease in her maternal grandfather; Asthma in her sister and sister; Cancer in her sister; Diabetes in her sister and sister; Heart disease in her mother and sister; Hyperlipidemia in her brother and  mother; Hypertension in her mother, sister, and sister.    Review of Systems: Review of Systems  Constitutional: Negative.   HENT: Negative.   Respiratory: Negative.   Cardiovascular: Negative.   Gastrointestinal: Negative.   Musculoskeletal: Negative.   Neurological: Negative.   Psychiatric/Behavioral: Negative.   All other systems reviewed and are negative.    PHYSICAL EXAM: VS:  BP 110/70 (BP Location: Left Arm, Patient Position: Sitting, Cuff Size: Normal)   Pulse 85   Ht _0  (1.651 m)   Wt 191 lb (86.6 kg)   BMI 31.78 kg/m  , BMI Body mass index is 31.78 kg/m.  Repeat blood pressure 110/70 GEN: Well nourished, well developed, in no acute distress HEENT: normal Neck: no JVD, carotid bruits, or masses Cardiac: RRR; no murmurs, rubs, or gallops,no edema  Respiratory:  clear to auscultation bilaterally, normal work of breathing GI: soft, nontender, nondistended, + BS MS: no deformity or atrophy Skin: warm and dry, no rash Neuro:  Strength and sensation are intact Psych: euthymic mood, full affect  Recent Labs: 06/10/2018: Magnesium 2.1 10/24/2018: B Natriuretic Peptide 49.0; Hemoglobin 9.6; Platelets 202; TSH 1.651 03/04/2019: ALT 18 04/13/2019: BUN 20; Creatinine, Ser 1.28; Potassium 4.4; Sodium 137    Lipid Panel Lab Results  Component Value Date   CHOL 303 (H) 11/03/2018   HDL 24 (L) 11/03/2018   LDLCALC Comment 11/03/2018   TRIG 1,246 (HH) 11/03/2018      Wt Readings from Last 3 Encounters:  06/02/19 191 lb (86.6 kg)  05/18/19 190 lb 9.6 oz (86.5 kg)  04/13/19 191 lb (86.6 kg)     ASSESSMENT AND PLAN:  Problem List Items Addressed This Visit      Cardiology Problems   Chronic systolic heart failure ([place]) - Primary (Chronic)   Relevant Orders   EKG 12-Lead   NICM (nonischemic cardiomyopathy) ([place])   Relevant Orders   EKG 12-Lead     Other   Benign hypertensive renal disease   Cirrhosis ([place])     Nonischemic cardiomyopathy No medication  changes made Appears euvolemic  Diabetes type 2 with complications On insulin Markedly elevated sugars today close to 400 Glucose checked for low blood pressure on arrival 90 systolic Recheck pressure 110 measured by myself -Long discussion concerning risk of dehydration and DKA from poorly controlled sugars Hemoglobin A1c greater than 12 Discussed diet with her such as eating [employer] as she did today for lunch Family tried to help her   Disposition:   F/U  12 months   Total encounter time more than 25 minutes  Greater than 50% was spent in counseling and coordination of care with the patient    Signed, Octavia Bruckner  Rockey Situ, M.D., Ph.D. [place], [place]

## 2019-06-08 ENCOUNTER — Other Ambulatory Visit: Payer: Self-pay | Admitting: Psychiatry

## 2019-06-08 ENCOUNTER — Telehealth: Payer: Self-pay | Admitting: Psychiatry

## 2019-06-08 ENCOUNTER — Other Ambulatory Visit: Payer: Self-pay | Admitting: Family Medicine

## 2019-06-08 DIAGNOSIS — F3132 Bipolar disorder, current episode depressed, moderate: Secondary | ICD-10-CM

## 2019-06-08 MED ORDER — RISPERIDONE 1 MG PO TABS: 1.5000 mg | ORAL_TABLET | Freq: Every day | ORAL | 0 refills | Status: DC

## 2019-06-08 NOTE — Telephone Encounter (Signed)
Sent Risperidone to pharmacy - 1.5 mg

## 2019-06-08 NOTE — Telephone Encounter (Signed)
Requested medication (s) are due for refill today: yes  Requested medication (s) are on the active medication list: yes  Last refill:  12/23/2018  Future visit scheduled: yes  Notes to clinic:  Refill cannot be delegated   Requested Prescriptions  Pending Prescriptions Disp Refills   dicyclomine (BENTYL) 20 MG tablet [Pharmacy Med Name: DICYCLOMINE 20 MG TABLET] 270 tablet 0    Sig: TAKE 1 TABLET BY MOUTH 3 TIMES DAILY AS NEEDED FOR SPASMS.     Gastroenterology:  Antispasmodic Agents Passed - 06/08/2019  9:03 AM      Passed - Last Heart Rate in normal range    Pulse Readings from Last 1 Encounters:  06/02/19 85         Passed - Valid encounter within last 12 months    Recent Outpatient Visits          3 weeks ago Type 2 diabetes mellitus with stage 3a chronic kidney disease, with long-term current use of insulin (HCC)   [facility] [place], Megan P, DO   1 month ago Type 2 diabetes mellitus with stage 3 chronic kidney disease, with long-term current use of insulin (HCC)   [facility] [place], Megan P, DO   3 months ago Type 2 diabetes mellitus with stage 3 chronic kidney disease, with long-term current use of insulin (HCC)   [facility] [place], Megan P, DO   3 months ago Type 2 diabetes mellitus with stage 3 chronic kidney disease, with long-term current use of insulin (HCC)   [facility] [place], Megan P, DO   6 months ago Type 2 diabetes mellitus with stage 3 chronic kidney disease, with long-term current use of insulin (HCC)   [facility] [place], [place], DO      Future Appointments            In 2 months Johnson, Megan P, DO [facility]            metoCLOPramide (REGLAN) 10 MG tablet [Pharmacy Med Name: METOCLOPRAMIDE 10 MG TABLET] 90 tablet 1    Sig: Take 1 tablet (10 mg total) by mouth 3 (three) times daily with meals.     Not Delegated - Gastroenterology: Antiemetics Failed -  06/08/2019  9:03 AM      Failed - This refill cannot be delegated      Passed - Valid encounter within last 6 months    Recent Outpatient Visits          3 weeks ago Type 2 diabetes mellitus with stage 3a chronic kidney disease, with long-term current use of insulin (HCC)   [facility] [place], Megan P, DO   1 month ago Type 2 diabetes mellitus with stage 3 chronic kidney disease, with long-term current use of insulin (HCC)   [facility] [place], Megan P, DO   3 months ago Type 2 diabetes mellitus with stage 3 chronic kidney disease, with long-term current use of insulin (HCC)   [facility] [place], Megan P, DO   3 months ago Type 2 diabetes mellitus with stage 3 chronic kidney disease, with long-term current use of insulin (HCC)   [facility] [place], Megan P, DO   6 months ago Type 2 diabetes mellitus with stage 3 chronic kidney disease, with long-term current use of insulin (HCC)   [facility] [place], Megan P, DO      Future Appointments  In 2 months Wynetta Emery, Barb Merino, DO [facility], [facility]

## 2019-06-09 NOTE — Telephone Encounter (Signed)
This encounter was created in error - please disregard.

## 2019-06-14 ENCOUNTER — Other Ambulatory Visit: Payer: Self-pay | Admitting: Cardiovascular Disease

## 2019-06-15 ENCOUNTER — Telehealth: Payer: Self-pay

## 2019-06-15 MED ORDER — CARVEDILOL 12.5 MG PO TABS: 12.5000 mg | ORAL_TABLET | Freq: Two times a day (BID) | ORAL | 1 refills | Status: DC

## 2019-06-15 NOTE — Telephone Encounter (Signed)
Requested Prescriptions   Signed Prescriptions Disp Refills  . carvedilol (COREG) 12.5 MG tablet 180 tablet 1    Sig: Take 1 tablet (12.5 mg total) by mouth 2 (two) times daily.    Authorizing Provider: Theora Gianotti    Ordering User: Raelene Bott, BRANDY L

## 2019-06-22 ENCOUNTER — Encounter: Payer: Self-pay | Admitting: Psychiatry

## 2019-06-22 ENCOUNTER — Other Ambulatory Visit: Payer: Self-pay

## 2019-06-22 ENCOUNTER — Ambulatory Visit (INDEPENDENT_AMBULATORY_CARE_PROVIDER_SITE_OTHER): Payer: Medicare Other | Admitting: Psychiatry

## 2019-06-22 DIAGNOSIS — F410 Panic disorder [episodic paroxysmal anxiety] without agoraphobia: Secondary | ICD-10-CM

## 2019-06-22 DIAGNOSIS — F1210 Cannabis abuse, uncomplicated: Secondary | ICD-10-CM | POA: Diagnosis not present

## 2019-06-22 DIAGNOSIS — F3132 Bipolar disorder, current episode depressed, moderate: Secondary | ICD-10-CM | POA: Diagnosis not present

## 2019-06-22 DIAGNOSIS — F5105 Insomnia due to other mental disorder: Secondary | ICD-10-CM

## 2019-06-22 DIAGNOSIS — I200 Unstable angina: Secondary | ICD-10-CM

## 2019-06-22 DIAGNOSIS — F4312 Post-traumatic stress disorder, chronic: Secondary | ICD-10-CM | POA: Diagnosis not present

## 2019-06-22 MED ORDER — RISPERIDONE 2 MG PO TABS: 2.0000 mg | ORAL_TABLET | Freq: Every day | ORAL | 0 refills | Status: DC

## 2019-06-22 MED ORDER — PAROXETINE HCL 30 MG PO TABS: 30.0000 mg | ORAL_TABLET | Freq: Every day | ORAL | 1 refills | Status: DC

## 2019-06-22 MED ORDER — ZOLPIDEM TARTRATE 5 MG PO TABS: 5.0000 mg | ORAL_TABLET | Freq: Every evening | ORAL | 1 refills | Status: DC | PRN

## 2019-06-22 MED ORDER — LAMOTRIGINE 25 MG PO TABS: 75.0000 mg | ORAL_TABLET | ORAL | 0 refills | Status: DC

## 2019-06-22 NOTE — Progress Notes (Signed)
Virtual Visit via Video Note  I connected with Bonnie Ochoa on 06/22/19 at  4:15 PM EST by a video enabled telemedicine application and verified that I am speaking with the correct person using two identifiers.   I discussed the limitations of evaluation and management by telemedicine and the availability of in person appointments. The patient expressed understanding and agreed to proceed.     I discussed the assessment and treatment plan with the patient. The patient was provided an opportunity to ask questions and all were answered. The patient agreed with the plan and demonstrated an understanding of the instructions.   The patient was advised to call back or seek an in-person evaluation if the symptoms worsen or if the condition fails to improve as anticipated.  [facility] MD OP Progress Note  06/22/2019 3:46 PM Bonnie Ochoa  MRN:  [id number]  Chief Complaint:  Chief Complaint    Follow-up     HPI: Bonnie Ochoa is a 53 year old African-American female, engaged, lives in [place], has a history of PTSD, bipolar disorder, panic disorder, cannabis use disorder, insomnia, OSA on CPAP, RLS, diabetes, cirrhosis of liver, chronic renal failure, congestive heart failure, recent cataract surgery, hypertension was evaluated by telemedicine today.  Collateral information was obtained from wife- Bonnie Ochoa.  Patient today reports she is currently struggling with mood swings.  She has been hearing auditory hallucinations which tells her to do negative things.  She reports the voices ask her to snap at her wife.  Patient also reports sleep is restless some days.  She continues to be compliant on her medications as prescribed.  She reports she has been cutting back on cannabis.  Patient denies any suicidality, homicidality or perceptual disturbances.  Collateral information from wife-Per wife patient has been having irritability as well as mood swings.  Patient is motivated to restart psychotherapy sessions,  she reports she has not seen her therapist in a while.   Visit Diagnosis:    ICD-10-CM   1. Bipolar disorder, current episode depressed, moderate (HCC)  F31.32 lamoTRIgine (LAMICTAL) 25 MG tablet    risperiDONE (RISPERDAL) 2 MG tablet  2. Chronic post-traumatic stress disorder (PTSD)  F43.12 lamoTRIgine (LAMICTAL) 25 MG tablet    PARoxetine (PAXIL) 30 MG tablet  3. Panic disorder  F41.0 PARoxetine (PAXIL) 30 MG tablet  4. Cannabis use disorder, mild, abuse  F12.10   5. Insomnia due to mental condition  F51.05 zolpidem (AMBIEN) 5 MG tablet    Past Psychiatric History: Reviewed past psychiatric history from my progress note on 10/16/2017  Past Medical History:  Past Medical History:  Diagnosis Date  . Abdominal pain    a. 05/2018 HIDA scan wnl.  . ADHD   . AICD (automatic cardioverter/defibrillator) present    a. 11/2014 s/p SJM Fortify Assura, single lead AICD (ser# [id number]).  Marland Kitchen Anemia   . Arthritis   . Asthma   . Bipolar 1 disorder ([place])   . Chest pain    a. Hx of cath in [place] - reportedly nl; b. 04/2018 MV: EF 22%, fixed dist ant septal, apical, and inferoapical defects - ? scar vs. attenuation. No ischemia.  . Cirrhosis of liver ([place])   . Coronary artery disease   . Depression   . Diabetes mellitus without complication ([place])   . Diverticulitis   . Dysrhythmia   . Heart murmur   . HFrEF (heart failure with reduced ejection fraction) ([place])    a. 06/2017 Echo: EF 20-25%, diff HK. Mildly dil LA; b. 05/2018 Echo:  EF 25-30%, diff HK, Gr2 DD. Triv AI. Mod MR. Mildly reduced RV fxn. Mod-Sev TR. PASP 45-50mHg.  .Marland KitchenHypertension   . Hypothyroidism   . IBS (irritable bowel syndrome)   . Insomnia   . Migraines   . NICM (nonischemic cardiomyopathy) ([place]    a. EF prev 25%; b. 11/2014 s/p SJM Fortify Assura, single lead AICD (ser# [id number]; c. 06/2017 Echo: EF 20-25%; d. 05/2018 Echo: EF 25-30%, diff HK, Gr2 DD; e. 05/2018 Echo: EF 25-30%; f. 05/2018 Cath: Nl cors. LVEDP 287mg, PCWP 327m.  PA 65/40 (52). CO/CI 3.04/1.56; c. 07/2018 CPX: Mod HF limitation.  . OSA (obstructive sleep apnea)    CPAP is broken  . PTSD (post-traumatic stress disorder)   . Restless leg syndrome   . Vertigo     Past Surgical History:  Procedure Laterality Date  . ABDOMINAL HYSTERECTOMY    . CARDIAC CATHETERIZATION    . CATARACT EXTRACTION W/PHACO Right 01/27/2019   Procedure: CATARACT EXTRACTION PHACO AND INTRAOCULAR LENS PLACEMENT ([place] DIABET[place];  Surgeon: PorBirder RobsonD;  [facility];  Laterality: Right;  Diabetes - insulin sleep apnea  . COLONOSCOPY WITH PROPOFOL N/A 03/11/2019   Procedure: COLONOSCOPY WITH PROPOFOL;  Surgeon: TahVirgel ManifoldD;  Location: [facility];  Service: Endoscopy;  Laterality: N/A;  . CORONARY ARTERY BYPASS GRAFT     Pt denies  . ESOPHAGOGASTRODUODENOSCOPY (EGD) WITH PROPOFOL N/A 03/11/2019   Procedure: ESOPHAGOGASTRODUODENOSCOPY (EGD) WITH PROPOFOL;  Surgeon: TahVirgel ManifoldD;  Location: [facility] ENDOSCOPY;  Service: Endoscopy;  Laterality: N/A;  . INSERT / REPLACE / REMOVE PACEMAKER     ICD  . INSERTION OF ICD  2016   St Jude Single chamber ICD  . RIGHT/LEFT HEART CATH AND CORONARY ANGIOGRAPHY N/A 06/09/2018   Procedure: RIGHT/LEFT HEART CATH AND CORONARY ANGIOGRAPHY;  Surgeon: AriWellington HampshireD;  [facility];  Service: Cardiovascular;  Laterality: N/A;  . TONSILLECTOMY    . TUBAL LIGATION  1980    Family Psychiatric History: Reviewed family psychiatric history from my progress note on 10/16/2017  Family History:  Family History  Problem Relation Age of Onset  . Hypertension Mother   . Hyperlipidemia Mother   . Heart disease Mother   . Hypertension Sister   . Asthma Sister   . Heart disease Sister   . Diabetes Sister   . Cancer Sister   . Alzheimer's disease Maternal Grandfather   . Hyperlipidemia Brother   . Asthma Sister   . Hypertension Sister   .  Diabetes Sister     Social History: Reviewed social history from my progress note on 10/16/2017 Social History   Socioeconomic History  . Marital status: Married    Spouse name: Not on file  . Number of children: 2  . Years of education: Not on file  . Highest education level: High school graduate  Occupational History    Comment: disabled  Social Needs  . Financial resource strain: Not hard at all  . Food insecurity    Worry: Sometimes true    Inability: Sometimes true  . Transportation needs    Medical: Yes    Non-medical: Yes  Tobacco Use  . Smoking status: Former Smoker    Types: Cigarettes    Quit date: 1985    Years since quitting: 35.8  . Smokeless tobacco: Never Used  . Tobacco comment: quit over  20 years ago   Substance and Sexual Activity  .  Alcohol use: Not Currently  . Drug use: Not Currently    Frequency: 1.0 times per week    Types: Marijuana  . Sexual activity: Yes    Birth control/protection: Surgical    Comment: one partner  Lifestyle  . Physical activity    Days per week: 0 days    Minutes per session: 0 min  . Stress: Very much  Relationships  . Social Herbalist on phone: Never    Gets together: Twice a week    Attends religious service: Never    Active member of club or organization: No    Attends meetings of clubs or organizations: Never    Relationship status: Married  Other Topics Concern  . Not on file  Social History Narrative   Volunteers occasionally     Allergies:  Allergies  Allergen Reactions  . Levothyroxine Rash    Metabolic Disorder Labs: Lab Results  Component Value Date   HGBA1C 12.7 (H) 05/14/2019   MPG 185.77 05/08/2018   MPG >398 07/03/2017   No results found for: PROLACTIN Lab Results  Component Value Date   CHOL 303 (H) 11/03/2018   TRIG 1,246 (HH) 11/03/2018   HDL 24 (L) 11/03/2018   CHOLHDL 5.4 05/09/2018   VLDL 53 (H) 05/09/2018   [place] Comment 11/03/2018   LDLCALC 84 05/09/2018    Lab Results  Component Value Date   TSH 1.651 10/24/2018   TSH 1.560 02/19/2018    Therapeutic Level Labs: No results found for: LITHIUM No results found for: VALPROATE No components found for:  CBMZ  Current Medications: Current Outpatient Medications  Medication Sig Dispense Refill  . atorvastatin (LIPITOR) 80 MG tablet TAKE 1 TABLET (80 MG TOTAL) BY MOUTH DAILY AT 6 PM. 90 tablet 1  . blood glucose meter kit and supplies KIT Dispense based on patient and insurance preference. Use up to four times daily as directed. (FOR ICD-9 250.00, 250.01). 1 each 0  . carvedilol (COREG) 12.5 MG tablet Take 1 tablet (12.5 mg total) by mouth 2 (two) times daily. 180 tablet 1  . chlorhexidine (PERIDEX) 0.12 % solution     . cyclobenzaprine (FLEXERIL) 5 MG tablet Take 5 mg by mouth 3 (three) times daily as needed for muscle spasms.    Marland Kitchen dicyclomine (BENTYL) 20 MG tablet TAKE 1 TABLET BY MOUTH 3 TIMES DAILY AS NEEDED FOR SPASMS. 270 tablet 0  . digoxin (LANOXIN) 0.125 MG tablet Take 1 tablet (0.125 mg total) by mouth daily. 90 tablet 3  . Dulaglutide (TRULICITY) 1.5 UV/2.5DG SOPN Inject 1.5 mg into the skin once a week. 4 pen 3  . DUREZOL 0.05 % EMUL     . famotidine (PEPCID) 40 MG tablet TAKE 1 TABLET BY MOUTH EVERY DAY IN THE EVENING 90 tablet 1  . furosemide (LASIX) 40 MG tablet TAKE 1 TABLET BY MOUTH EVERY DAY 90 tablet 2  . Insulin Glargine (LANTUS SOLOSTAR) 100 UNIT/ML Solostar Pen Inject 20 Units into the skin daily at 10 pm. 5 pen 12  . Insulin Pen Needle 32G X 6 MM MISC 1 each by Does not apply route daily. 100 each 12  . KLOR-CON M20 20 MEQ tablet TAKE 2 TABLETS (40 MEQ TOTAL) BY MOUTH 2 (TWO) TIMES DAILY. 360 tablet 1  . lamoTRIgine (LAMICTAL) 25 MG tablet Take 3 tablets (75 mg total) by mouth as directed. Take 1 tablet AM and 2 tablets PM- TOTAL 75 MG DAILY 270 tablet 0  . metoCLOPramide (REGLAN)  10 MG tablet TAKE 1 TABLET (10 MG TOTAL) BY MOUTH 3 (THREE) TIMES DAILY WITH MEALS. 90  tablet 1  . pantoprazole (PROTONIX) 40 MG tablet TAKE 1 TABLET BY MOUTH EVERY DAY 90 tablet 3  . PARoxetine (PAXIL) 30 MG tablet Take 1 tablet (30 mg total) by mouth daily. 90 tablet 1  . sacubitril-valsartan (ENTRESTO) 49-51 MG Take 1 tablet by mouth 2 (two) times daily. 180 tablet 1  . spironolactone (ALDACTONE) 25 MG tablet Take 25 mg by mouth daily.    . sucralfate (CARAFATE) 1 g tablet TAKE 1 TABLET (1 G TOTAL) BY MOUTH 4 (FOUR) TIMES DAILY. 360 tablet 3  . zolpidem (AMBIEN) 5 MG tablet Take 1 tablet (5 mg total) by mouth at bedtime as needed for sleep. 30 tablet 1  . empagliflozin (JARDIANCE) 25 MG TABS tablet Take 25 mg by mouth daily. (Patient not taking: Reported on 06/22/2019) 90 tablet 1  . ibuprofen (ADVIL) 400 MG tablet     . ILEVRO 0.3 % ophthalmic suspension     . nitroGLYCERIN (NITROSTAT) 0.4 MG SL tablet Place 1 tablet (0.4 mg total) under the tongue every 5 (five) minutes as needed for chest pain. (Patient not taking: Reported on 05/18/2019) 30 tablet 0  . risperiDONE (RISPERDAL) 2 MG tablet Take 1 tablet (2 mg total) by mouth at bedtime. 90 tablet 0   No current facility-administered medications for this visit.      Musculoskeletal: Strength & Muscle Tone: UTA Gait & Station: normal Patient leans: N/A  Psychiatric Specialty Exam: Review of Systems  Psychiatric/Behavioral: Positive for depression and hallucinations. The patient is nervous/anxious and has insomnia.   All other systems reviewed and are negative.   There were no vitals taken for this visit.There is no height or weight on file to calculate BMI.  General Appearance: Casual  Eye Contact:  Fair  Speech:  Clear and Coherent  Volume:  Normal  Mood:  Irritable  Affect:  Appropriate  Thought Process:  Goal Directed and Descriptions of Associations: Intact  Orientation:  Full (Time, Place, and Person)  Thought Content: Hallucinations: Auditory negative voices asking her snap at her wife  Suicidal Thoughts:   No  Homicidal Thoughts:  No  Memory:  Immediate;   Fair Recent;   Fair Remote;   Fair  Judgement:  Fair  Insight:  Fair  Psychomotor Activity:  Normal  Concentration:  Concentration: Fair and Attention Span: Fair  Recall:  [employer] of Knowledge: Fair  Language: Fair  Akathisia:  No  Handed:  Right  AIMS (if indicated): denies tremors, rigidity  Assets:  Communication Skills Desire for Improvement Housing Intimacy Social Support Talents/Skills  ADL's:  Intact  Cognition: WNL  Sleep:  Restless   Screenings: GAD-7     Office Visit from 12/28/2016 in [facility]  Total GAD-7 Score  21    PHQ2-9     Clinical Support from 05/18/2019 in [facility] Patient Outreach Telephone from 06/16/2018 in [place] Office Visit from 05/08/2018 in [place] Office Visit from 04/23/2018 in [facility] from 02/19/2018 in [facility]  PHQ-2 Total Score  1  0  1  0  2  PHQ-9 Total Score  -  -  -  -  7       Assessment and Plan: Ilene is a 53 year old African-American female who has a history of bipolar disorder, PTSD, panic  attacks, cannabis use disorder, congestive heart failure, liver disease, chronic renal failure, diabetes mellitus, OSA, hypertension, RLS was evaluated by telemedicine today.  Patient continues to struggle with mood swings as well as sleep problems.  Plan as noted below.  Plan Bipolar disorder-unstable Increase risperidone to 2 mg p.o. nightly Increase lamotrigine 75 mg p.o. daily in divided dosage Paxil 30 mg p.o. daily  PTSD-stable Paxil as prescribed  Panic disorder-stable Paxil as prescribed  Insomnia-unstable Discussed sleep hygiene techniques Ambien 5 mg p.o. nightly as needed CPAP for OSA  Cannabis use disorder-mild abuse-improving Provided substance abuse counseling.  She is cutting back  Patient  encouraged to schedule an appointment with therapist.  Collateral information obtained from wife as summarized above.   Follow-up in clinic in 1 month or sooner if needed.  December 8 at 3:30 PM I have spent atleast 15 minutes non  face to face with patient today. More than 50 % of the time was spent for psychoeducation and supportive psychotherapy and care coordination. This note was generated in part or whole with voice recognition software. Voice recognition is usually quite accurate but there are transcription errors that can and very often do occur. I apologize for any typographical errors that were not detected and corrected.      [name], MD 06/22/2019, 3:46 PM

## 2019-06-27 ENCOUNTER — Other Ambulatory Visit: Payer: Self-pay | Admitting: Family Medicine

## 2019-06-27 NOTE — Telephone Encounter (Signed)
Requested Prescriptions  Pending Prescriptions Disp Refills  . ENTRESTO 49-51 MG [Pharmacy Med Name: ENTRESTO 49 MG-51 MG TABLET] 180 tablet 0    Sig: TAKE 1 TABLET BY MOUTH TWICE A DAY     Off-Protocol Failed - 06/27/2019  9:01 AM      Failed - Medication not assigned to a protocol, review manually.      Passed - Valid encounter within last 12 months    Recent Outpatient Visits          1 month ago Type 2 diabetes mellitus with stage 3a chronic kidney disease, with long-term current use of insulin ([place])   [place], Megan P, DO   2 months ago Type 2 diabetes mellitus with stage 3 chronic kidney disease, with long-term current use of insulin ([place])   [place], Megan P, DO   3 months ago Type 2 diabetes mellitus with stage 3 chronic kidney disease, with long-term current use of insulin ([place])   [place], Megan P, DO   4 months ago Type 2 diabetes mellitus with stage 3 chronic kidney disease, with long-term current use of insulin ([place])   [place], Megan P, DO   6 months ago Type 2 diabetes mellitus with stage 3 chronic kidney disease, with long-term current use of insulin ([place])   [place], Aynor, DO      Future Appointments            In 1 month Johnson, Barb Merino, DO [employer]

## 2019-07-01 ENCOUNTER — Other Ambulatory Visit: Payer: Self-pay | Admitting: Family Medicine

## 2019-07-01 ENCOUNTER — Other Ambulatory Visit (HOSPITAL_COMMUNITY): Payer: Self-pay | Admitting: Adult Health

## 2019-07-21 ENCOUNTER — Encounter: Payer: Self-pay | Admitting: Psychiatry

## 2019-07-21 ENCOUNTER — Ambulatory Visit (INDEPENDENT_AMBULATORY_CARE_PROVIDER_SITE_OTHER): Payer: Medicare Other | Admitting: Psychiatry

## 2019-07-21 ENCOUNTER — Other Ambulatory Visit: Payer: Self-pay

## 2019-07-21 DIAGNOSIS — F4312 Post-traumatic stress disorder, chronic: Secondary | ICD-10-CM

## 2019-07-21 DIAGNOSIS — F410 Panic disorder [episodic paroxysmal anxiety] without agoraphobia: Secondary | ICD-10-CM

## 2019-07-21 DIAGNOSIS — F1210 Cannabis abuse, uncomplicated: Secondary | ICD-10-CM | POA: Diagnosis not present

## 2019-07-21 DIAGNOSIS — I200 Unstable angina: Secondary | ICD-10-CM | POA: Diagnosis not present

## 2019-07-21 DIAGNOSIS — F3175 Bipolar disorder, in partial remission, most recent episode depressed: Secondary | ICD-10-CM

## 2019-07-21 DIAGNOSIS — F5105 Insomnia due to other mental disorder: Secondary | ICD-10-CM

## 2019-07-21 NOTE — Progress Notes (Signed)
Virtual Visit via Telephone Note  I connected with Bonnie Ochoa on 07/21/19 at  3:30 PM EST by telephone and verified that I am speaking with the correct person using two identifiers.   I discussed the limitations, risks, security and privacy concerns of performing an evaluation and management service by telephone and the availability of in person appointments. I also discussed with the patient that there may be a patient responsible charge related to this service. The patient expressed understanding and agreed to proceed.      I discussed the assessment and treatment plan with the patient. The patient was provided an opportunity to ask questions and all were answered. The patient agreed with the plan and demonstrated an understanding of the instructions.   The patient was advised to call back or seek an in-person evaluation if the symptoms worsen or if the condition fails to improve as anticipated.  Bonnie Ochoa OP Progress Note  07/21/2019 5:53 PM Bonnie Ochoa  MRN:  [id number]  Chief Complaint:  Chief Complaint    Follow-up     HPI: Bonnie Ochoa is a 53 year old African-American female, engaged, lives in [place], has a history of PTSD, bipolar disorder, panic disorder, cannabis use disorder, insomnia, OSA on CPAP, RLS, diabetes, cirrhosis of liver, chronic renal failure, congestive heart failure, recent cataract surgery, hypertension was evaluated by phone today.  Patient preferred to do a phone call.  Patient today reports she is doing well with regards to her mood symptoms.  She denies any significant depression at this time.  Patient however does report auditory hallucinations-she is unable to elaborate them and reports they are just noises.  She however reports she is able to cope with that and it does not distress her.  Patient reports she sleeps during the day and is unable to sleep at night.  Her sleep hygiene is currently disrupted.  Patient is compliant on medications as  prescribed.  He is trying to cut back on cannabis.  Reports good support system from her partner.  Patient denies any suicidality or homicidality.   Visit Diagnosis:    ICD-10-CM   1. Bipolar disorder, in partial remission, most recent episode depressed ([place])  F31.75   2. Chronic post-traumatic stress disorder (PTSD)  F43.12   3. Panic disorder  F41.0   4. Cannabis use disorder, mild, abuse  F12.10   5. Insomnia due to mental condition  F51.05     Past Psychiatric History: I have reviewed past psychiatric history from my progress note on 10/16/2017.  Past Medical History:  Past Medical History:  Diagnosis Date  . Abdominal pain    a. 05/2018 HIDA scan wnl.  . ADHD   . AICD (automatic cardioverter/defibrillator) present    a. 11/2014 s/p SJM Fortify Assura, single lead AICD (ser# [id number]).  Marland Kitchen Anemia   . Arthritis   . Asthma   . Bipolar 1 disorder ([place])   . Chest pain    a. Hx of cath in [place] - reportedly nl; b. 04/2018 MV: EF 22%, fixed dist ant septal, apical, and inferoapical defects - ? scar vs. attenuation. No ischemia.  . Cirrhosis of liver ([place])   . Coronary artery disease   . Depression   . Diabetes mellitus without complication ([place])   . Diverticulitis   . Dysrhythmia   . Heart murmur   . HFrEF (heart failure with reduced ejection fraction) ([place])    a. 06/2017 Echo: EF 20-25%, diff HK. Mildly dil LA; b. 05/2018 Echo: EF 25-30%, diff  HK, Gr2 DD. Triv AI. Mod MR. Mildly reduced RV fxn. Mod-Sev TR. PASP 45-70mHg.  .Marland KitchenHypertension   . Hypothyroidism   . IBS (irritable bowel syndrome)   . Insomnia   . Migraines   . NICM (nonischemic cardiomyopathy) ([place]    a. EF prev 25%; b. 11/2014 s/p SJM Fortify Assura, single lead AICD (ser# [id number]; c. 06/2017 Echo: EF 20-25%; d. 05/2018 Echo: EF 25-30%, diff HK, Gr2 DD; e. 05/2018 Echo: EF 25-30%; f. 05/2018 Cath: Nl cors. LVEDP 234mg, PCWP 3264m. PA 65/40 (52). CO/CI 3.04/1.56; c. 07/2018 CPX: Mod HF limitation.  . OSA  (obstructive sleep apnea)    CPAP is broken  . PTSD (post-traumatic stress disorder)   . Restless leg syndrome   . Vertigo     Past Surgical History:  Procedure Laterality Date  . ABDOMINAL HYSTERECTOMY    . CARDIAC CATHETERIZATION    . CATARACT EXTRACTION W/PHACO Right 01/27/2019   Procedure: CATARACT EXTRACTION PHACO AND INTRAOCULAR LENS PLACEMENT ([facility] BLUE RIGHT DIABETES;  Surgeon: PorBirder RobsonD;  [facility];  Laterality: Right;  Diabetes - insulin sleep apnea  . COLONOSCOPY WITH PROPOFOL N/A 03/11/2019   Procedure: COLONOSCOPY WITH PROPOFOL;  Surgeon: TahVirgel ManifoldD;  Location: [facility];  Service: Endoscopy;  Laterality: N/A;  . CORONARY ARTERY BYPASS GRAFT     Pt denies  . ESOPHAGOGASTRODUODENOSCOPY (EGD) WITH PROPOFOL N/A 03/11/2019   Procedure: ESOPHAGOGASTRODUODENOSCOPY (EGD) WITH PROPOFOL;  Surgeon: TahVirgel ManifoldD;  Location: [facility] END[facility]COPY;  Service: Endoscopy;  Laterality: N/A;  . INSERT / REPLACE / REMOVE PACEMAKER     ICD  . INSERTION OF ICD  2016   St Jude Single chamber ICD  . RIGHT/LEFT HEART CATH AND CORONARY ANGIOGRAPHY N/A 06/09/2018   Procedure: RIGHT/LEFT HEART CATH AND CORONARY ANGIOGRAPHY;  Surgeon: AriWellington HampshireD;  [facility];  Service: Cardiovascular;  Laterality: N/A;  . TONSILLECTOMY    . TUBAL LIGATION  1980    Family Psychiatric History: I have reviewed family psychiatric history from my progress note on 10/16/2017. Family History:  Family History  Problem Relation Age of Onset  . Hypertension Mother   . Hyperlipidemia Mother   . Heart disease Mother   . Hypertension Sister   . Asthma Sister   . Heart disease Sister   . Diabetes Sister   . Cancer Sister   . Alzheimer's disease Maternal Grandfather   . Hyperlipidemia Brother   . Asthma Sister   . Hypertension Sister   . Diabetes Sister     Social History: I have reviewed social  history from my progress note on 10/16/2017. Social History   Socioeconomic History  . Marital status: Married    Spouse name: Not on file  . Number of children: 2  . Years of education: Not on file  . Highest education level: High school graduate  Occupational History    Comment: disabled  Social Needs  . Financial resource strain: Not hard at all  . Food insecurity    Worry: Sometimes true    Inability: Sometimes true  . Transportation needs    Medical: Yes    Non-medical: Yes  Tobacco Use  . Smoking status: Former Smoker    Types: Cigarettes    Quit date: 1985    Years since quitting: 35.9  . Smokeless tobacco: Never Used  . Tobacco comment: quit over  20 years ago   Substance and Sexual Activity  .  Alcohol use: Not Currently  . Drug use: Not Currently    Frequency: 1.0 times per week    Types: Marijuana  . Sexual activity: Yes    Birth control/protection: Surgical    Comment: one partner  Lifestyle  . Physical activity    Days per week: 0 days    Minutes per session: 0 min  . Stress: Very much  Relationships  . Social Herbalist on phone: Never    Gets together: Twice a week    Attends religious service: Never    Active member of club or organization: No    Attends meetings of clubs or organizations: Never    Relationship status: Married  Other Topics Concern  . Not on file  Social History Narrative   Volunteers occasionally     Allergies:  Allergies  Allergen Reactions  . Levothyroxine Rash    Metabolic Disorder Labs: Lab Results  Component Value Date   HGBA1C 12.7 (H) 05/14/2019   MPG 185.77 05/08/2018   MPG >398 07/03/2017   No results found for: PROLACTIN Lab Results  Component Value Date   CHOL 303 (H) 11/03/2018   TRIG 1,246 (HH) 11/03/2018   HDL 24 (L) 11/03/2018   CHOLHDL 5.4 05/09/2018   VLDL 53 (H) 05/09/2018   [place] Comment 11/03/2018   LDLCALC 84 05/09/2018   Lab Results  Component Value Date   TSH 1.651  10/24/2018   TSH 1.560 02/19/2018    Therapeutic Level Labs: No results found for: LITHIUM No results found for: VALPROATE No components found for:  CBMZ  Current Medications: Current Outpatient Medications  Medication Sig Dispense Refill  . atorvastatin (LIPITOR) 80 MG tablet TAKE 1 TABLET (80 MG TOTAL) BY MOUTH DAILY AT 6 PM. 90 tablet 1  . blood glucose meter kit and supplies KIT Dispense based on patient and insurance preference. Use up to four times daily as directed. (FOR ICD-9 250.00, 250.01). 1 each 0  . carvedilol (COREG) 12.5 MG tablet Take 1 tablet (12.5 mg total) by mouth 2 (two) times daily. 180 tablet 1  . chlorhexidine (PERIDEX) 0.12 % solution     . cyclobenzaprine (FLEXERIL) 5 MG tablet Take 5 mg by mouth 3 (three) times daily as needed for muscle spasms.    Marland Kitchen dicyclomine (BENTYL) 20 MG tablet TAKE 1 TABLET BY MOUTH 3 TIMES DAILY AS NEEDED FOR SPASMS. 270 tablet 0  . digoxin (LANOXIN) 0.125 MG tablet TAKE 1 TABLET BY MOUTH DAILY 90 tablet 3  . Dulaglutide (TRULICITY) 1.5 IR/6.7EL SOPN Inject 1.5 mg into the skin once a week. 4 pen 3  . DUREZOL 0.05 % EMUL     . empagliflozin (JARDIANCE) 25 MG TABS tablet Take 25 mg by mouth daily. (Patient not taking: Reported on 06/22/2019) 90 tablet 1  . ENTRESTO 49-51 MG TAKE 1 TABLET BY MOUTH TWICE A DAY 180 tablet 0  . famotidine (PEPCID) 40 MG tablet TAKE 1 TABLET BY MOUTH EVERY DAY IN THE EVENING 90 tablet 1  . furosemide (LASIX) 40 MG tablet TAKE 1 TABLET BY MOUTH EVERY DAY 90 tablet 2  . ibuprofen (ADVIL) 400 MG tablet     . ILEVRO 0.3 % ophthalmic suspension     . Insulin Glargine (LANTUS SOLOSTAR) 100 UNIT/ML Solostar Pen Inject 20 Units into the skin daily at 10 pm. 5 pen 12  . Insulin Pen Needle 32G X 6 MM MISC 1 each by Does not apply route daily. 100 each 12  .  KLOR-CON M20 20 MEQ tablet TAKE 2 TABLETS (40 MEQ TOTAL) BY MOUTH 2 (TWO) TIMES DAILY. 360 tablet 1  . lamoTRIgine (LAMICTAL) 25 MG tablet Take 3 tablets (75 mg  total) by mouth as directed. Take 1 tablet AM and 2 tablets PM- TOTAL 75 MG DAILY 270 tablet 0  . metoCLOPramide (REGLAN) 10 MG tablet TAKE 1 TABLET (10 MG TOTAL) BY MOUTH 3 (THREE) TIMES DAILY WITH MEALS. 90 tablet 1  . nitroGLYCERIN (NITROSTAT) 0.4 MG SL tablet Place 1 tablet (0.4 mg total) under the tongue every 5 (five) minutes as needed for chest pain. (Patient not taking: Reported on 05/18/2019) 30 tablet 0  . pantoprazole (PROTONIX) 40 MG tablet TAKE 1 TABLET BY MOUTH EVERY DAY 90 tablet 3  . PARoxetine (PAXIL) 30 MG tablet Take 1 tablet (30 mg total) by mouth daily. 90 tablet 1  . risperiDONE (RISPERDAL) 2 MG tablet Take 1 tablet (2 mg total) by mouth at bedtime. 90 tablet 0  . spironolactone (ALDACTONE) 25 MG tablet Take 25 mg by mouth daily.    . sucralfate (CARAFATE) 1 g tablet TAKE 1 TABLET (1 G TOTAL) BY MOUTH 4 (FOUR) TIMES DAILY. 360 tablet 0  . zolpidem (AMBIEN) 5 MG tablet Take 1 tablet (5 mg total) by mouth at bedtime as needed for sleep. 30 tablet 1   No current facility-administered medications for this visit.      Musculoskeletal: Strength & Muscle Tone: UTA Gait & Station: Reports as WNL Patient leans: N/A  Psychiatric Specialty Exam: Review of Systems  Psychiatric/Behavioral: Positive for hallucinations. The patient has insomnia.   All other systems reviewed and are negative.   There were no vitals taken for this visit.There is no height or weight on file to calculate BMI.  General Appearance: UTA  Eye Contact:  UTA  Speech:  Clear and Coherent  Volume:  Normal  Mood:  Euthymic  Affect:  UTA  Thought Process:  Goal Directed and Descriptions of Associations: Intact  Orientation:  Full (Time, Place, and Person)  Thought Content: Hallucinations: Auditory on and off - voices unable to elaborate - is able to cope  Suicidal Thoughts:  No  Homicidal Thoughts:  No  Memory:  Immediate;   Fair Recent;   Fair Remote;   Fair  Judgement:  Fair  Insight:  Fair   Psychomotor Activity:  UTA  Concentration:  Concentration: Fair and Attention Span: Fair  Recall:  [employer] of Knowledge: Fair  Language: Fair  Akathisia:  No  Handed:  Right  AIMS (if indicated): denies tremors, rigidity  Assets:  Communication Skills Desire for Improvement Housing Intimacy Social Support  ADL's:  Intact  Cognition: WNL  Sleep:  Restless   Screenings: GAD-7     Office Visit from 12/28/2016 in [place]  Total GAD-7 Score  21    PHQ2-9     Clinical Support from 05/18/2019 in [facility] Patient Outreach Telephone from 06/16/2018 in [place] Visit from 05/08/2018 in [place] Office Visit from 04/23/2018 in [facility] from 02/19/2018 in [facility]  PHQ-2 Total Score  1  0  1  0  2  PHQ-9 Total Score  -  -  -  -  7       Assessment and Plan: Clarrissa is a 53 year old African-American female who has a history of bipolar disorder, PTSD, panic attacks, cannabis use disorder,  congestive heart failure, liver disease, chronic renal failure, diabetes melitis, OSA, hypertension, RLS was evaluated by phone today.  Patient currently struggles with sleep problems.  Plan as noted below.  Plan Bipolar disorder-improving Risperidone 2 mg p.o. nightly Lamictal 75 mg p.o. daily in divided dosage Paxil 30 mg p.o. daily  PTSD-stable Paxil as prescribed  Panic disorder-stable Paxil as prescribed  Insomnia-unstable Patient reports she sleeps during the day and is not able to sleep at night. Discussed sleep hygiene techniques.  Patient will work on it. Ambien 5 mg p.o. nightly as needed. CPAP for OSA  Cannabis use disorder-mild abuse-improving Provided substance abuse counseling.  Patient encouraged to schedule an appointment with therapist.  Follow-up in clinic in 1 to 2 months or sooner if needed.   February 4 at 2 PM  I have spent atleast 15 minutes non  face to face with patient today. More than 50 % of the time was spent for psychoeducation and supportive psychotherapy and care coordination. This note was generated in part or whole with voice recognition software. Voice recognition is usually quite accurate but there are transcription errors that can and very often do occur. I apologize for any typographical errors that were not detected and corrected.         [name], Ochoa 07/21/2019, 5:53 PM

## 2019-08-04 ENCOUNTER — Other Ambulatory Visit: Payer: Self-pay | Admitting: Family Medicine

## 2019-08-10 IMAGING — NM NM MYOCAR MULTI W/SPECT W/WALL MOTION & EF
11 series · 60 of 60 positions shown · non-contrast
Comparison: none

[Series 1000: raw stress_dc · 4.80mm/px · 5 of 68 frames shown]
[frame 6/68]
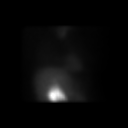
[frame 17/68]
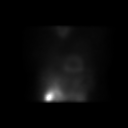
[frame 29/68]
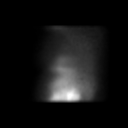
[frame 40/68]
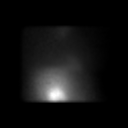
[frame 51/68]
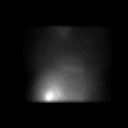

[Series 1000: raw stress (recon - ac ) · 4.8mm · 4.80mm/px · 6 of 39 frames shown]
[frame 4/39]
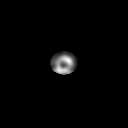
[frame 10/39]
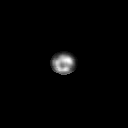
[frame 17/39]
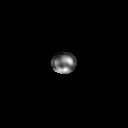
[frame 23/39]
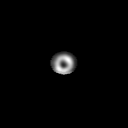
[frame 30/39]
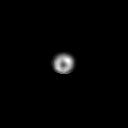
[frame 36/39]
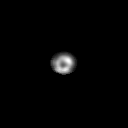

[Series 1000: raw stress (recon - noac ) · 4.8mm · 4.80mm/px · 5 of 32 frames shown]
[frame 3/32]
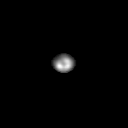
[frame 8/32]
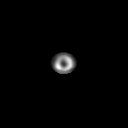
[frame 14/32]
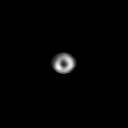
[frame 19/32]
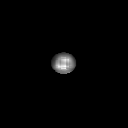
[frame 30/32]
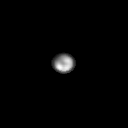

[Series 1000: raw stress-gated_dc · 4.80mm/px · 6 of 544 frames shown]
[frame 46/544]
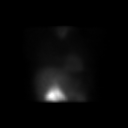
[frame 136/544]
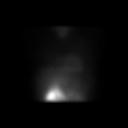
[frame 227/544]
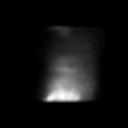
[frame 318/544]
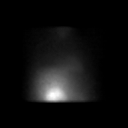
[frame 408/544]
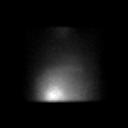
[frame 499/544]
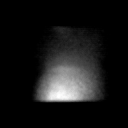

[Series 1000: raw rest · 4.80mm/px · 5 of 68 frames shown]
[frame 6/68]
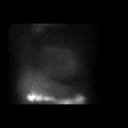
[frame 17/68]
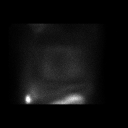
[frame 29/68]
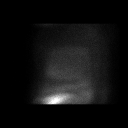
[frame 51/68]
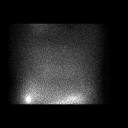
[frame 63/68]
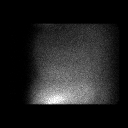

[Series 1000: raw stress · 4.80mm/px · 6 of 68 frames shown]
[frame 6/68]
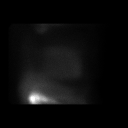
[frame 17/68]
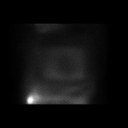
[frame 29/68]
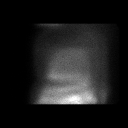
[frame 40/68]
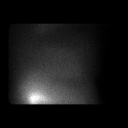
[frame 51/68]
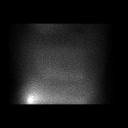
[frame 63/68]
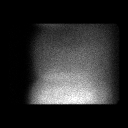

[Series 1000: raw rest_dc · 4.80mm/px · 5 of 68 frames shown]
[frame 6/68]
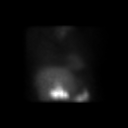
[frame 17/68]
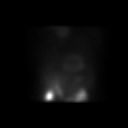
[frame 40/68]
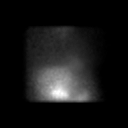
[frame 51/68]
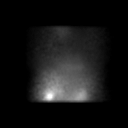
[frame 63/68]
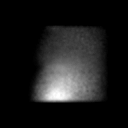

[Series 1000: raw rest (recon - noac ) · 4.8mm · 4.80mm/px · 6 of 34 frames shown]
[frame 3/34]
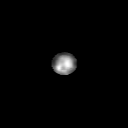
[frame 9/34]
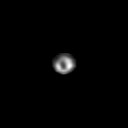
[frame 15/34]
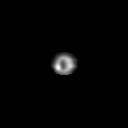
[frame 20/34]
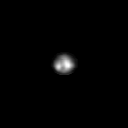
[frame 26/34]
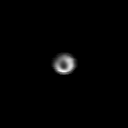
[frame 32/34]
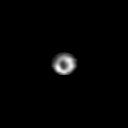

[Series 1000: raw stress-gated · 4.80mm/px · 5 of 544 frames shown]
[frame 46/544]
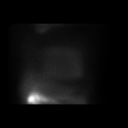
[frame 227/544]
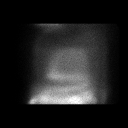
[frame 318/544]
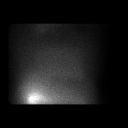
[frame 408/544]
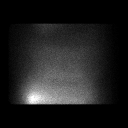
[frame 499/544]
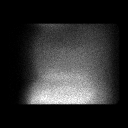

[Series 1000: raw rest (recon - ac ) · 4.8mm · 4.80mm/px · 6 of 36 frames shown]
[frame 4/36]
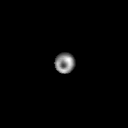
[frame 10/36]
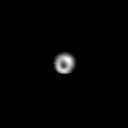
[frame 16/36]
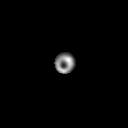
[frame 22/36]
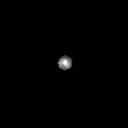
[frame 28/36]
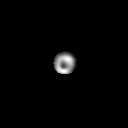
[frame 34/36]
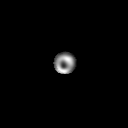

[Series 1000: raw stress-gated (recon) · 4.8mm · 4.80mm/px · 5 of 296 frames shown]
[frame 74/296]
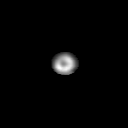
[frame 124/296]
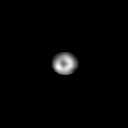
[frame 173/296]
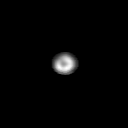
[frame 222/296]
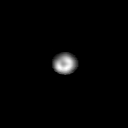
[frame 272/296]
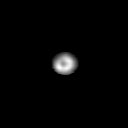

[60 of 60 positions shown; findings below may reference images not displayed]

Canned report from images found in remote index.

Refer to host system for actual result text.

## 2019-08-18 ENCOUNTER — Ambulatory Visit: Payer: Medicare Other | Admitting: Family Medicine

## 2019-08-20 IMAGING — CR DG CHEST 2V
1 series · 2 of 2 positions shown · non-contrast
Comparison: 05/08/2018

CLINICAL DATA: Chest pain

EXAM:
CHEST - 2 VIEW

[Series 1: w chest pa · 0.14mm/px · 2 of 2 slices shown]
[im 1/2]
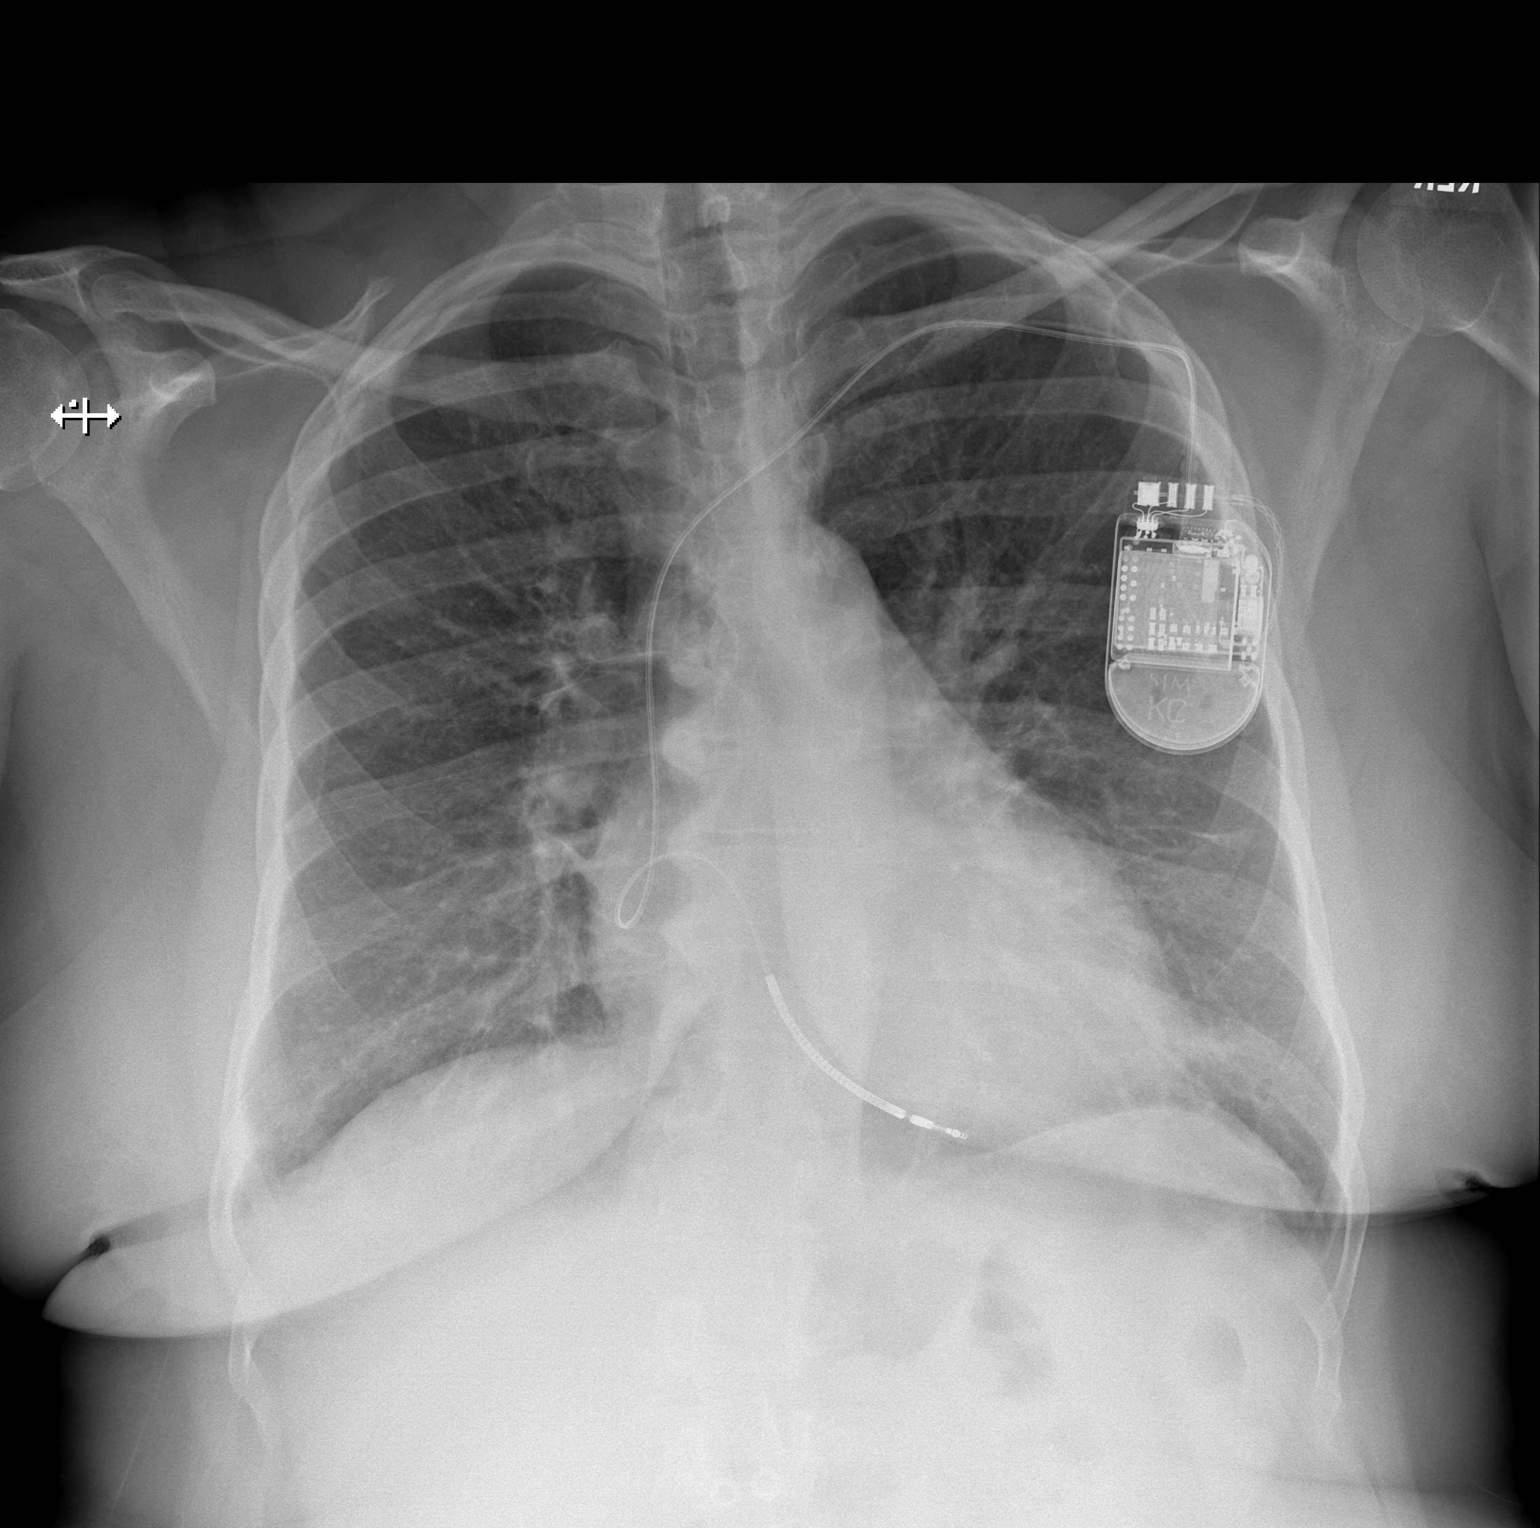
[im 2/2]
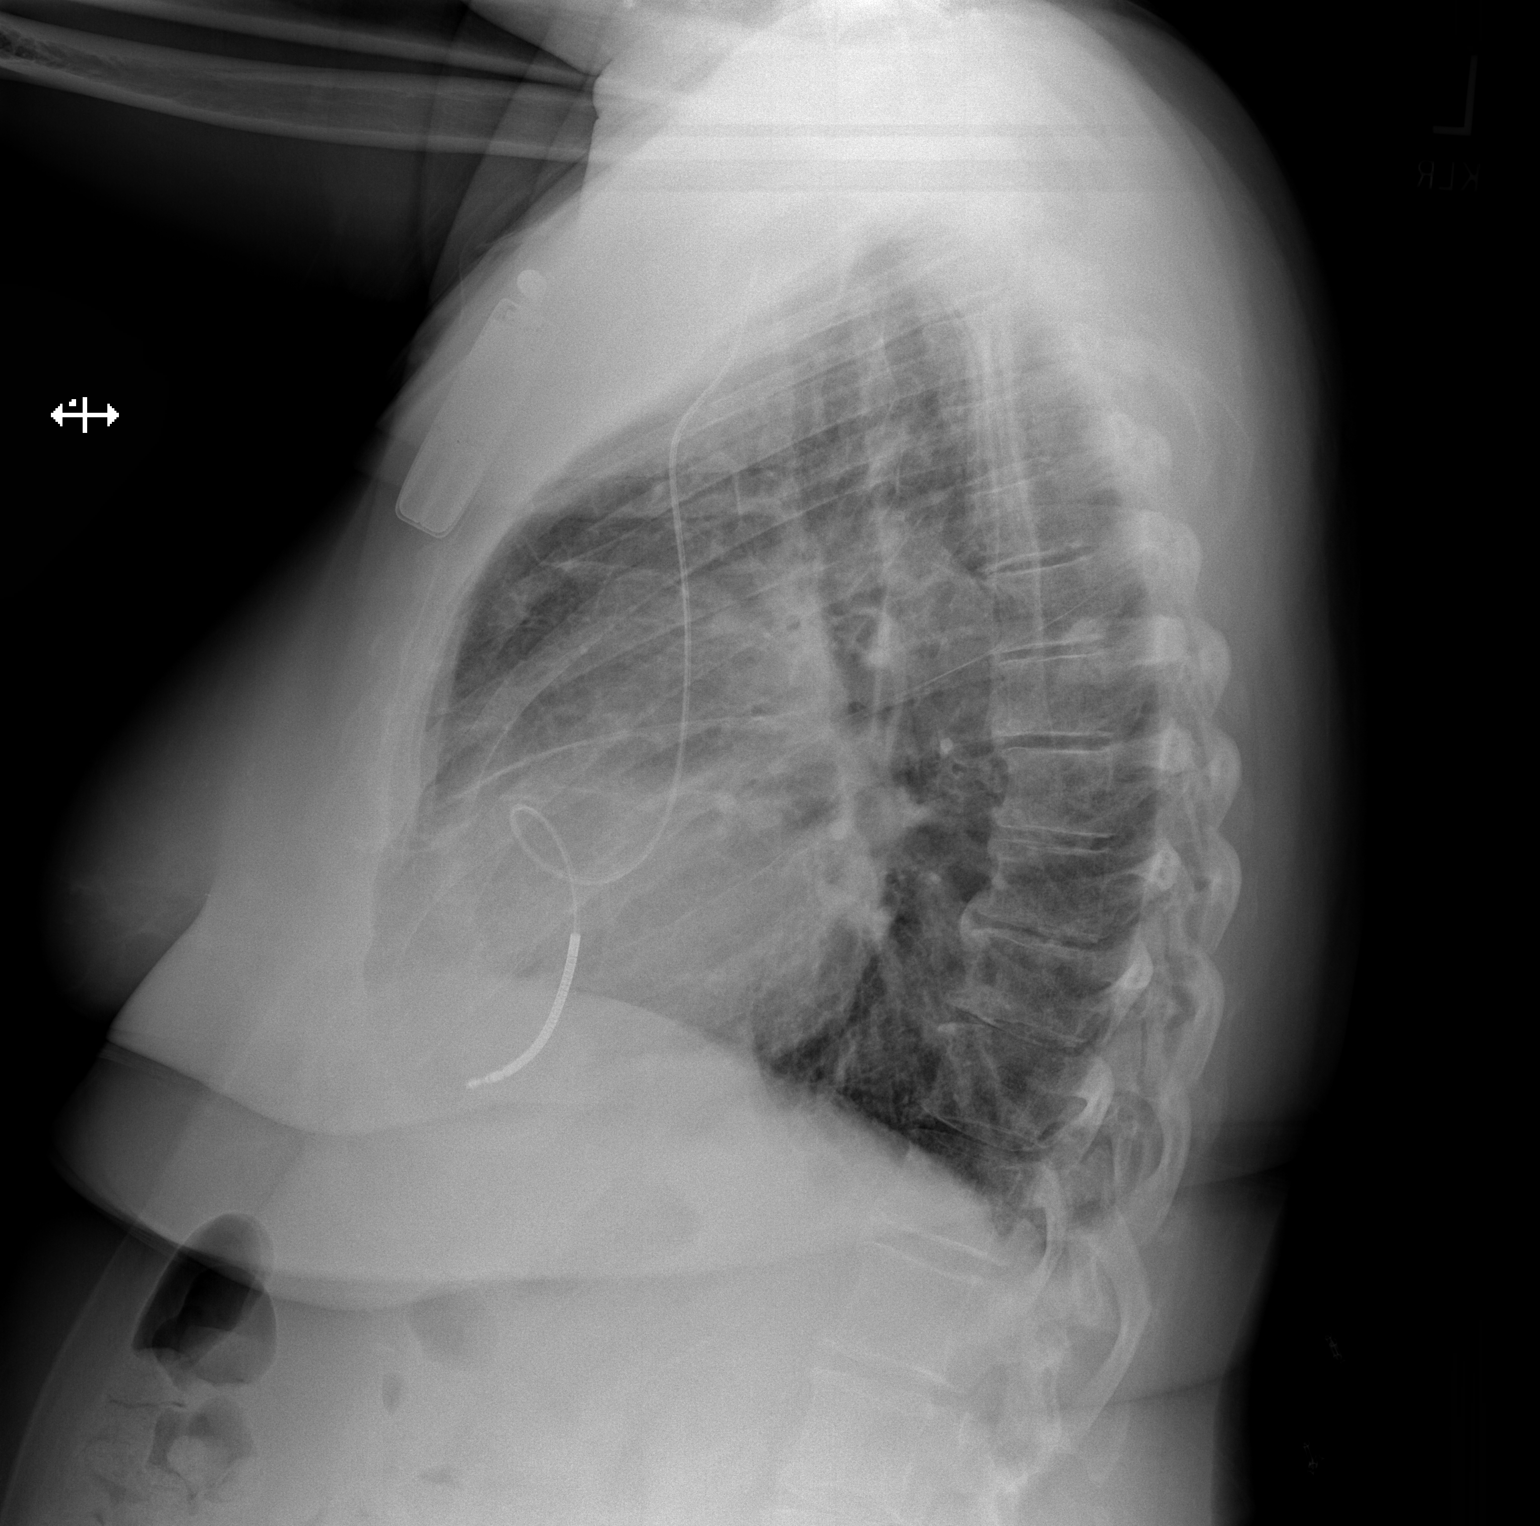

[2 of 2 positions shown; findings below may reference images not displayed]

FINDINGS: Left AICD remains in place, unchanged. Heart is borderline in size.
Lingular scarring. No confluent opacity on the right. No effusions
or acute bony abnormality.
IMPRESSION: Lingular scarring.  No active disease.

## 2019-08-25 ENCOUNTER — Ambulatory Visit: Payer: Medicare Other | Admitting: Psychiatry

## 2019-08-31 ENCOUNTER — Other Ambulatory Visit: Payer: Self-pay | Admitting: Family Medicine

## 2019-08-31 ENCOUNTER — Other Ambulatory Visit: Payer: Self-pay | Admitting: Psychiatry

## 2019-08-31 DIAGNOSIS — F3132 Bipolar disorder, current episode depressed, moderate: Secondary | ICD-10-CM

## 2019-08-31 NOTE — Telephone Encounter (Signed)
Requested Prescriptions  Pending Prescriptions Disp Refills  . ENTRESTO 49-51 MG [Pharmacy Med Name: ENTRESTO 49 MG-51 MG TABLET] 180 tablet 0    Sig: TAKE 1 TABLET BY MOUTH TWICE A DAY     Off-Protocol Failed - 08/31/2019 12:19 AM      Failed - Medication not assigned to a protocol, review manually.      Passed - Valid encounter within last 12 months    Recent Outpatient Visits          3 months ago Type 2 diabetes mellitus with stage 3a chronic kidney disease, with long-term current use of insulin ([place])   [place], Megan P, DO   4 months ago Type 2 diabetes mellitus with stage 3 chronic kidney disease, with long-term current use of insulin ([place])   [place], Megan P, DO   5 months ago Type 2 diabetes mellitus with stage 3 chronic kidney disease, with long-term current use of insulin ([place])   [place], Megan P, DO   6 months ago Type 2 diabetes mellitus with stage 3 chronic kidney disease, with long-term current use of insulin ([place])   [facility] [place], Megan P, DO   9 months ago Type 2 diabetes mellitus with stage 3 chronic kidney disease, with long-term current use of insulin ([place])   [facility] [place], Megan P, DO             . sucralfate (CARAFATE) 1 g tablet [Pharmacy Med Name: SUCRALFATE 1 GM TABLET] 360 tablet 0    Sig: TAKE 1 TABLET (1 G TOTAL) BY MOUTH 4 (FOUR) TIMES DAILY.     Gastroenterology: Antiacids Passed - 08/31/2019 12:19 AM      Passed - Valid encounter within last 12 months    Recent Outpatient Visits          3 months ago Type 2 diabetes mellitus with stage 3a chronic kidney disease, with long-term current use of insulin ([place])   [place], Megan P, DO   4 months ago Type 2 diabetes mellitus with stage 3 chronic kidney disease, with long-term current use of insulin ([place])   [place], Megan P, DO   5 months ago Type  2 diabetes mellitus with stage 3 chronic kidney disease, with long-term current use of insulin ([place])   [place], Megan P, DO   6 months ago Type 2 diabetes mellitus with stage 3 chronic kidney disease, with long-term current use of insulin ([place])   [facility] [place], Megan P, DO   9 months ago Type 2 diabetes mellitus with stage 3 chronic kidney disease, with long-term current use of insulin ([place])   [facility] [place], Megan P, DO             . metoCLOPramide (REGLAN) 10 MG tablet [Pharmacy Med Name: METOCLOPRAMIDE 10 MG TABLET] 90 tablet 1    Sig: TAKE 1 TABLET (10 MG TOTAL) BY MOUTH 3 (THREE) TIMES DAILY WITH MEALS.     Not Delegated - Gastroenterology: Antiemetics Failed - 08/31/2019 12:19 AM      Failed - This refill cannot be delegated      Passed - Valid encounter within last 6 months    Recent Outpatient Visits          3 months ago Type 2 diabetes mellitus with stage 3a chronic kidney disease, with long-term current use of insulin ([place])  [facility] [place], Megan P, DO   4 months ago Type 2 diabetes mellitus with stage 3 chronic kidney disease, with long-term current use of insulin (H[facility])   [facility] [place], Megan P, DO   5 months ago Type 2 diabetes mellitus with stage 3 chronic kidney disease, with long-term current use of insulin (H[facility])   [facility] [place], Megan P, DO   6 months ago Type 2 diabetes mellitus with stage 3 chronic kidney disease, with long-term current use of insulin (HCC)   [facility] [place], Megan P, DO   9 months ago Type 2 diabetes mellitus with stage 3 chronic kidney disease, with long-term current use of insulin (HCC)   [facility] [place], [place], DO

## 2019-08-31 NOTE — Telephone Encounter (Signed)
Requested medication (s) are due for refill today: entresto, yes  Requested medication (s) are on the active medication list: yes  Last refill:  06/27/2019  Future visit scheduled: no  Notes to clinic:  no assigned protocol  Requested medication (s) are due for refill today: metoclopramide, yes  Requested medication (s) are on the active medication list: yes  Last refill:  07/07/2019  Future visit scheduled: no  Notes to clinic:  not delegated  Requested Prescriptions  Pending Prescriptions Disp Refills   ENTRESTO 49-51 MG [Pharmacy Med Name: ENTRESTO 49 MG-51 MG TABLET] 180 tablet 0    Sig: TAKE 1 TABLET BY MOUTH TWICE A DAY      Off-Protocol Failed - 08/31/2019 12:19 AM      Failed - Medication not assigned to a protocol, review manually.      Passed - Valid encounter within last 12 months    Recent Outpatient Visits           3 months ago Type 2 diabetes mellitus with stage 3a chronic kidney disease, with long-term current use of insulin ([place])   [place], Megan P, DO   4 months ago Type 2 diabetes mellitus with stage 3 chronic kidney disease, with long-term current use of insulin ([place])   [place], Megan P, DO   5 months ago Type 2 diabetes mellitus with stage 3 chronic kidney disease, with long-term current use of insulin ([place])   [place], Megan P, DO   6 months ago Type 2 diabetes mellitus with stage 3 chronic kidney disease, with long-term current use of insulin ([place])   [facility] [place], Megan P, DO   9 months ago Type 2 diabetes mellitus with stage 3 chronic kidney disease, with long-term current use of insulin ([place])   [facility] [place], Megan P, DO                metoCLOPramide (REGLAN) 10 MG tablet [Pharmacy Med Name: METOCLOPRAMIDE 10 MG TABLET] 90 tablet 1    Sig: TAKE 1 TABLET (10 MG TOTAL) BY MOUTH 3 (THREE) TIMES DAILY WITH MEALS.      Not Delegated -  Gastroenterology: Antiemetics Failed - 08/31/2019 12:19 AM      Failed - This refill cannot be delegated      Passed - Valid encounter within last 6 months    Recent Outpatient Visits           3 months ago Type 2 diabetes mellitus with stage 3a chronic kidney disease, with long-term current use of insulin ([place])   [place], Megan P, DO   4 months ago Type 2 diabetes mellitus with stage 3 chronic kidney disease, with long-term current use of insulin ([place])   [place], Megan P, DO   5 months ago Type 2 diabetes mellitus with stage 3 chronic kidney disease, with long-term current use of insulin ([place])   [place], Megan P, DO   6 months ago Type 2 diabetes mellitus with stage 3 chronic kidney disease, with long-term current use of insulin ([place])   [facility] [place], Megan P, DO   9 months ago Type 2 diabetes mellitus with stage 3 chronic kidney disease, with long-term current use of insulin ([place])   [place], [place], DO               Signed Prescriptions Disp Refills  sucralfate (CARAFATE) 1 g tablet 360 tablet 0    Sig: TAKE 1 TABLET (1 G TOTAL) BY MOUTH 4 (FOUR) TIMES DAILY.      Gastroenterology: Antiacids Passed - 08/31/2019 12:19 AM      Passed - Valid encounter within last 12 months    Recent Outpatient Visits           3 months ago Type 2 diabetes mellitus with stage 3a chronic kidney disease, with long-term current use of insulin (HCC)   [facility] [place], Megan P, DO   4 months ago Type 2 diabetes mellitus with stage 3 chronic kidney disease, with long-term current use of insulin (HCC)   [facility] [place], Megan P, DO   5 months ago Type 2 diabetes mellitus with stage 3 chronic kidney disease, with long-term current use of insulin (HCC)   [facility] [place], Megan P, DO   6 months ago Type 2 diabetes mellitus with stage 3  chronic kidney disease, with long-term current use of insulin (HCC)   [facility] [place], Megan P, DO   9 months ago Type 2 diabetes mellitus with stage 3 chronic kidney disease, with long-term current use of insulin (HCC)   [facility] [place], [place], DO

## 2019-09-09 ENCOUNTER — Other Ambulatory Visit: Payer: Self-pay | Admitting: Gastroenterology

## 2019-09-15 ENCOUNTER — Other Ambulatory Visit: Payer: Self-pay | Admitting: Psychiatry

## 2019-09-15 DIAGNOSIS — F4312 Post-traumatic stress disorder, chronic: Secondary | ICD-10-CM

## 2019-09-15 DIAGNOSIS — F3132 Bipolar disorder, current episode depressed, moderate: Secondary | ICD-10-CM

## 2019-09-17 ENCOUNTER — Ambulatory Visit (INDEPENDENT_AMBULATORY_CARE_PROVIDER_SITE_OTHER): Payer: Medicare Other | Admitting: Psychiatry

## 2019-09-17 ENCOUNTER — Other Ambulatory Visit: Payer: Self-pay

## 2019-09-17 DIAGNOSIS — Z5329 Procedure and treatment not carried out because of patient's decision for other reasons: Secondary | ICD-10-CM | POA: Insufficient documentation

## 2019-09-17 DIAGNOSIS — Z91199 Patient's noncompliance with other medical treatment and regimen due to unspecified reason: Secondary | ICD-10-CM | POA: Insufficient documentation

## 2019-09-17 NOTE — Progress Notes (Signed)
No response to call or text. 

## 2019-09-28 ENCOUNTER — Encounter: Payer: Self-pay | Admitting: Family Medicine

## 2019-09-28 ENCOUNTER — Ambulatory Visit (INDEPENDENT_AMBULATORY_CARE_PROVIDER_SITE_OTHER): Payer: Medicare Other | Admitting: Family Medicine

## 2019-09-28 ENCOUNTER — Other Ambulatory Visit: Payer: Self-pay

## 2019-09-28 VITALS — BP 120/84 | HR 77 | Temp 98.3°F

## 2019-09-28 DIAGNOSIS — F319 Bipolar disorder, unspecified: Secondary | ICD-10-CM | POA: Diagnosis not present

## 2019-09-28 DIAGNOSIS — F3132 Bipolar disorder, current episode depressed, moderate: Secondary | ICD-10-CM | POA: Diagnosis not present

## 2019-09-28 DIAGNOSIS — N1831 Chronic kidney disease, stage 3a: Secondary | ICD-10-CM | POA: Diagnosis not present

## 2019-09-28 DIAGNOSIS — K7469 Other cirrhosis of liver: Secondary | ICD-10-CM

## 2019-09-28 DIAGNOSIS — I129 Hypertensive chronic kidney disease with stage 1 through stage 4 chronic kidney disease, or unspecified chronic kidney disease: Secondary | ICD-10-CM

## 2019-09-28 DIAGNOSIS — Z794 Long term (current) use of insulin: Secondary | ICD-10-CM | POA: Diagnosis not present

## 2019-09-28 DIAGNOSIS — E039 Hypothyroidism, unspecified: Secondary | ICD-10-CM

## 2019-09-28 DIAGNOSIS — E1121 Type 2 diabetes mellitus with diabetic nephropathy: Secondary | ICD-10-CM | POA: Diagnosis not present

## 2019-09-28 LAB — UA/M W/RFLX CULTURE, ROUTINE
Bilirubin, UA: NEGATIVE
Glucose, UA: NEGATIVE
Ketones, UA: NEGATIVE
Leukocytes,UA: NEGATIVE
Nitrite, UA: NEGATIVE
RBC, UA: NEGATIVE
Specific Gravity, UA: 1.025 (ref 1.005–1.030)
Urobilinogen, Ur: 0.2 mg/dL (ref 0.2–1.0)
pH, UA: 5.5 (ref 5.0–7.5)

## 2019-09-28 LAB — MICROALBUMIN, URINE WAIVED
Creatinine, Urine Waived: 300 mg/dL (ref 10–300)
Microalb, Ur Waived: 80 mg/L — ABNORMAL HIGH (ref 0–19)

## 2019-09-28 LAB — MICROSCOPIC EXAMINATION
Bacteria, UA: NONE SEEN
RBC, Urine: NONE SEEN /hpf (ref 0–2)
WBC, UA: NONE SEEN /hpf (ref 0–5)

## 2019-09-28 LAB — BAYER DCA HB A1C WAIVED: HB A1C (BAYER DCA - WAIVED): 9.9 % — ABNORMAL HIGH (ref <7.0)

## 2019-09-28 MED ORDER — TRULICITY 3 MG/0.5ML ~~LOC~~ SOAJ: 3.0000 mg | SUBCUTANEOUS | 3 refills | Status: DC

## 2019-09-28 NOTE — Progress Notes (Signed)
BP 120/84   Pulse 77   Temp 98.3 F (36.8 C)   SpO2 98%    Subjective:    Patient ID: Bonnie Ochoa, female    DOB: 03/24/1966, 54 y.o.   MRN: [id number]  HPI: Bonnie Ochoa is a 54 y.o. female  Chief Complaint  Patient presents with  . Diabetes   DIABETES Hypoglycemic episodes:no Polydipsia/polyuria: no Visual disturbance: no Chest pain: no Paresthesias: no Glucose Monitoring: yes  Accucheck frequency: Daily  Fasting glucose: 100 Taking Insulin?: yes Blood Pressure Monitoring: not checking Retinal Examination: Up to Date Foot Exam: Up to Date Diabetic Education: Completed Pneumovax: Up to Date Influenza: Up to Date Aspirin: yes  HYPERTENSION Hypertension status: controlled  Satisfied with current treatment? yes Duration of hypertension: chronic BP monitoring frequency:  not checking BP medication side effects:  no Medication compliance: excellent compliance Previous BP meds:lasix, digoxin, entresto, spironalactone Aspirin: no Recurrent headaches: no Visual changes: no Palpitations: no Dyspnea: no Chest pain: no Lower extremity edema: no Dizzy/lightheaded: no  HYPOTHYROIDISM Thyroid control status:controlled Satisfied with current treatment? yes Medication side effects: no Medication compliance: excellent compliance Recent dose adjustment:no Fatigue: no Cold intolerance: no Heat intolerance: no Weight gain: no Weight loss: no Constipation: no Diarrhea/loose stools: no Palpitations: no Lower extremity edema: no Anxiety/depressed mood: no   Relevant past medical, surgical, family and social history reviewed and updated as indicated. Interim medical history since our last visit reviewed. Allergies and medications reviewed and updated.  Review of Systems  Constitutional: Negative.   Respiratory: Negative.   Cardiovascular: Negative.   Musculoskeletal: Negative.   Neurological: Negative.   Psychiatric/Behavioral: Negative.     Per HPI  unless specifically indicated above     Objective:    BP 120/84   Pulse 77   Temp 98.3 F (36.8 C)   SpO2 98%   Wt Readings from Last 3 Encounters:  06/02/19 191 lb (86.6 kg)  05/18/19 190 lb 9.6 oz (86.5 kg)  04/13/19 191 lb (86.6 kg)    Physical Exam Vitals and nursing note reviewed.  Constitutional:      General: She is not in acute distress.    Appearance: Normal appearance. She is not ill-appearing, toxic-appearing or diaphoretic.  HENT:     Head: Normocephalic and atraumatic.     Right Ear: External ear normal.     Left Ear: External ear normal.     Nose: Nose normal.     Mouth/Throat:     Mouth: Mucous membranes are moist.     Pharynx: Oropharynx is clear.  Eyes:     General: No scleral icterus.       Right eye: No discharge.        Left eye: No discharge.     Extraocular Movements: Extraocular movements intact.     Conjunctiva/sclera: Conjunctivae normal.     Pupils: Pupils are equal, round, and reactive to light.  Cardiovascular:     Rate and Rhythm: Normal rate and regular rhythm.     Pulses: Normal pulses.     Heart sounds: Normal heart sounds. No murmur. No friction rub. No gallop.   Pulmonary:     Effort: Pulmonary effort is normal. No respiratory distress.     Breath sounds: Normal breath sounds. No stridor. No wheezing, rhonchi or rales.  Chest:     Chest wall: No tenderness.  Musculoskeletal:        General: Normal range of motion.     Cervical back: Normal range of motion  and neck supple.  Skin:    General: Skin is warm and dry.     Capillary Refill: Capillary refill takes less than 2 seconds.     Coloration: Skin is not jaundiced or pale.     Findings: No bruising, erythema, lesion or rash.  Neurological:     General: No focal deficit present.     Mental Status: She is alert and oriented to person, place, and time. Mental status is at baseline.  Psychiatric:        Mood and Affect: Mood normal.        Behavior: Behavior normal.         Thought Content: Thought content normal.        Judgment: Judgment normal.     Results for orders placed or performed in visit on 05/14/19  Bayer DCA Hb A1c Waived  Result Value Ref Range   HB A1C (BAYER DCA - WAIVED) 12.7 (H) <7.0 %      Assessment & Plan:   Problem List Items Addressed This Visit      Digestive   Cirrhosis ([place]) - Primary    Rechecking levels today. Continue to follow with GI. Call with any concerns. Continue to monitor.       Relevant Orders   CBC with Differential/Platelet   Comprehensive metabolic panel     Endocrine   DM (diabetes mellitus), type 2 with renal complications ([place])    Doing much better with A1c down to 9.9 from 12.7, but still too high. Will increase trulicity ot 3mg  weekly and continue other medicines. Recheck 3 months. Call with any concerns.       Relevant Medications   Dulaglutide (TRULICITY) 3 KG/4.0NU SOPN   Other Relevant Orders   Bayer DCA Hb A1c Waived   CBC with Differential/Platelet   Comprehensive metabolic panel   Lipid Panel w/o Chol/HDL Ratio   Microalbumin, Urine Waived   UA/M w/rflx Culture, Routine   Hypothyroid    Rechecking levels today. Await results, treat as needed. Call with any concerns.       Relevant Orders   CBC with Differential/Platelet   Comprehensive metabolic panel   TSH     Genitourinary   Benign hypertensive renal disease    Under good control on current regimen. Continue current regimen. Continue to monitor. Call with any concerns. Refills given. Labs drawn today.       Relevant Orders   CBC with Differential/Platelet   Comprehensive metabolic panel   Microalbumin, Urine Waived     Other   Bipolar 1 disorder (HCC)    Stable. Continue to follow with psychiatry. Call with any concerns. Continue to monitor.       Bipolar disorder, current episode depressed, moderate (HCC)    Stable. Continue to follow with psychiatry. Call with any concerns. Continue to monitor.           Follow  up plan: Return in about 3 months (around 12/26/2019).

## 2019-09-28 NOTE — Assessment & Plan Note (Signed)
Under good control on current regimen. Continue current regimen. Continue to monitor. Call with any concerns. Refills given. Labs drawn today.   

## 2019-09-28 NOTE — Assessment & Plan Note (Signed)
Stable. Continue to follow with psychiatry. Call with any concerns. Continue to monitor.  

## 2019-09-28 NOTE — Assessment & Plan Note (Signed)
Rechecking levels today. Continue to follow with GI. Call with any concerns. Continue to monitor.

## 2019-09-28 NOTE — Assessment & Plan Note (Signed)
Doing much better with A1c down to 9.9 from 12.7, but still too high. Will increase trulicity ot 3mg  weekly and continue other medicines. Recheck 3 months. Call with any concerns.

## 2019-09-28 NOTE — Assessment & Plan Note (Signed)
Rechecking levels today. Await results, treat as needed. Call with any concerns.

## 2019-09-29 LAB — COMPREHENSIVE METABOLIC PANEL
ALT: 15 IU/L (ref 0–32)
AST: 11 IU/L (ref 0–40)
Albumin/Globulin Ratio: 1.2 (ref 1.2–2.2)
Albumin: 4.2 g/dL (ref 3.8–4.9)
Alkaline Phosphatase: 106 IU/L (ref 39–117)
BUN/Creatinine Ratio: 15 (ref 9–23)
BUN: 16 mg/dL (ref 6–24)
Bilirubin Total: 0.7 mg/dL (ref 0.0–1.2)
CO2: 24 mmol/L (ref 20–29)
Calcium: 9.7 mg/dL (ref 8.7–10.2)
Chloride: 99 mmol/L (ref 96–106)
Creatinine, Ser: 1.05 mg/dL — ABNORMAL HIGH (ref 0.57–1.00)
GFR calc Af Amer: 69 mL/min/{1.73_m2} (ref >59)
GFR calc non Af Amer: 60 mL/min/{1.73_m2} (ref >59)
Globulin, Total: 3.5 g/dL (ref 1.5–4.5)
Glucose: 272 mg/dL — ABNORMAL HIGH (ref 65–99)
Potassium: 3.9 mmol/L (ref 3.5–5.2)
Sodium: 137 mmol/L (ref 134–144)
Total Protein: 7.7 g/dL (ref 6.0–8.5)

## 2019-09-29 LAB — CBC WITH DIFFERENTIAL/PLATELET
Basophils Absolute: 0 10*3/uL (ref 0.0–0.2)
Basos: 1 %
EOS (ABSOLUTE): 0.2 10*3/uL (ref 0.0–0.4)
Eos: 4 %
Hematocrit: 40.5 % (ref 34.0–46.6)
Hemoglobin: 13.8 g/dL (ref 11.1–15.9)
Immature Grans (Abs): 0 10*3/uL (ref 0.0–0.1)
Immature Granulocytes: 0 %
Lymphocytes Absolute: 2.5 10*3/uL (ref 0.7–3.1)
Lymphs: 40 %
MCH: 30.7 pg (ref 26.6–33.0)
MCHC: 34.1 g/dL (ref 31.5–35.7)
MCV: 90 fL (ref 79–97)
Monocytes Absolute: 0.5 10*3/uL (ref 0.1–0.9)
Monocytes: 9 %
Neutrophils Absolute: 2.8 10*3/uL (ref 1.4–7.0)
Neutrophils: 46 %
Platelets: 219 10*3/uL (ref 150–450)
RBC: 4.5 x10E6/uL (ref 3.77–5.28)
RDW: 11.8 % (ref 11.7–15.4)
WBC: 6.1 10*3/uL (ref 3.4–10.8)

## 2019-09-29 LAB — LIPID PANEL W/O CHOL/HDL RATIO
Cholesterol, Total: 214 mg/dL — ABNORMAL HIGH (ref 100–199)
HDL: 36 mg/dL — ABNORMAL LOW (ref >39)
LDL Chol Calc (NIH): 121 mg/dL — ABNORMAL HIGH (ref 0–99)
Triglycerides: 321 mg/dL — ABNORMAL HIGH (ref 0–149)
VLDL Cholesterol Cal: 57 mg/dL — ABNORMAL HIGH (ref 5–40)

## 2019-09-29 LAB — TSH: TSH: 1.42 u[IU]/mL (ref 0.450–4.500)

## 2019-10-02 ENCOUNTER — Other Ambulatory Visit: Payer: Self-pay | Admitting: Family Medicine

## 2019-10-02 MED ORDER — ROSUVASTATIN CALCIUM 40 MG PO TABS: 40.0000 mg | ORAL_TABLET | Freq: Every day | ORAL | 1 refills | Status: DC

## 2019-10-04 ENCOUNTER — Other Ambulatory Visit: Payer: Self-pay | Admitting: Family Medicine

## 2019-10-12 ENCOUNTER — Other Ambulatory Visit: Payer: Self-pay | Admitting: Family Medicine

## 2019-10-12 NOTE — Telephone Encounter (Signed)
Patient last seen 09/28/19 and has appointment 12/25/19

## 2019-10-23 ENCOUNTER — Other Ambulatory Visit: Payer: Self-pay | Admitting: Psychiatry

## 2019-10-23 DIAGNOSIS — F5105 Insomnia due to other mental disorder: Secondary | ICD-10-CM

## 2019-10-26 ENCOUNTER — Other Ambulatory Visit: Payer: Self-pay | Admitting: Psychiatry

## 2019-11-06 ENCOUNTER — Other Ambulatory Visit: Payer: Self-pay | Admitting: Family Medicine

## 2019-11-25 ENCOUNTER — Other Ambulatory Visit: Payer: Self-pay | Admitting: Family Medicine

## 2019-11-25 NOTE — Telephone Encounter (Signed)
Requested Prescriptions  Pending Prescriptions Disp Refills  . KLOR-CON M20 20 MEQ tablet [Pharmacy Med Name: KLOR-CON M20 TABLET] 360 tablet 2    Sig: TAKE 2 TABLETS (40 MEQ TOTAL) BY MOUTH 2 (TWO) TIMES DAILY.     Endocrinology:  Minerals - Potassium Supplementation Failed - 11/25/2019  1:25 AM      Failed - Cr in normal range and within 360 days    Creatinine, Ser  Date Value Ref Range Status  09/28/2019 1.05 (H) 0.57 - 1.00 mg/dL Final   Creatinine, Urine  Date Value Ref Range Status  07/02/2017 128 mg/dL Final         Passed - K in normal range and within 360 days    Potassium  Date Value Ref Range Status  09/28/2019 3.9 3.5 - 5.2 mmol/L Final         Passed - Valid encounter within last 12 months    Recent Outpatient Visits          1 month ago Other cirrhosis of liver (HCC)   [facility] [place], Megan P, DO   6 months ago Type 2 diabetes mellitus with stage 3a chronic kidney disease, with long-term current use of insulin (HCC)   [facility] [place], Megan P, DO   7 months ago Type 2 diabetes mellitus with stage 3 chronic kidney disease, with long-term current use of insulin (HCC)   [facility] [place], Megan P, DO   8 months ago Type 2 diabetes mellitus with stage 3 chronic kidney disease, with long-term current use of insulin (HCC)   [facility] [place], Megan P, DO   9 months ago Type 2 diabetes mellitus with stage 3 chronic kidney disease, with long-term current use of insulin (HCC)   [facility] [place], [place], DO      Future Appointments            In 1 month Johnson, Oralia Rud, DO [facility]

## 2019-12-04 ENCOUNTER — Other Ambulatory Visit: Payer: Self-pay | Admitting: Family Medicine

## 2019-12-20 ENCOUNTER — Other Ambulatory Visit: Payer: Self-pay | Admitting: Psychiatry

## 2019-12-20 DIAGNOSIS — F4312 Post-traumatic stress disorder, chronic: Secondary | ICD-10-CM

## 2019-12-20 DIAGNOSIS — F3132 Bipolar disorder, current episode depressed, moderate: Secondary | ICD-10-CM

## 2019-12-23 ENCOUNTER — Other Ambulatory Visit: Payer: Self-pay | Admitting: Family Medicine

## 2019-12-23 NOTE — Telephone Encounter (Signed)
Requested Prescriptions  Pending Prescriptions Disp Refills  . famotidine (PEPCID) 40 MG tablet [Pharmacy Med Name: FAMOTIDINE 40 MG TABLET] 90 tablet 3    Sig: TAKE 1 TABLET BY MOUTH EVERY DAY IN THE EVENING     Gastroenterology:  H2 Antagonists Passed - 12/23/2019  1:22 AM      Passed - Valid encounter within last 12 months    Recent Outpatient Visits          2 months ago Other cirrhosis of liver (HCC)   [facility] [place], Megan P, DO   7 months ago Type 2 diabetes mellitus with stage 3a chronic kidney disease, with long-term current use of insulin (HCC)   [facility] [place], Megan P, DO   8 months ago Type 2 diabetes mellitus with stage 3 chronic kidney disease, with long-term current use of insulin (HCC)   [facility] [place], Megan P, DO   9 months ago Type 2 diabetes mellitus with stage 3 chronic kidney disease, with long-term current use of insulin (HCC)   [facility] [place], Megan P, DO   10 months ago Type 2 diabetes mellitus with stage 3 chronic kidney disease, with long-term current use of insulin (HCC)   [facility] [place], [place], DO      Future Appointments            In 2 days Laural Benes, Oralia Rud, DO [employer] [facility], [facility]   In 2 weeks Dunn, Raymon Mutton, PA-C [facility]

## 2019-12-25 ENCOUNTER — Other Ambulatory Visit: Payer: Self-pay

## 2019-12-25 ENCOUNTER — Ambulatory Visit (INDEPENDENT_AMBULATORY_CARE_PROVIDER_SITE_OTHER): Payer: Medicare Other | Admitting: Family Medicine

## 2019-12-25 ENCOUNTER — Encounter: Payer: Self-pay | Admitting: Family Medicine

## 2019-12-25 VITALS — BP 119/83 | HR 76 | Temp 97.9°F | Ht 64.76 in | Wt 185.0 lb

## 2019-12-25 DIAGNOSIS — N1831 Chronic kidney disease, stage 3a: Secondary | ICD-10-CM | POA: Diagnosis not present

## 2019-12-25 DIAGNOSIS — E1122 Type 2 diabetes mellitus with diabetic chronic kidney disease: Secondary | ICD-10-CM

## 2019-12-25 DIAGNOSIS — E1121 Type 2 diabetes mellitus with diabetic nephropathy: Secondary | ICD-10-CM | POA: Diagnosis not present

## 2019-12-25 DIAGNOSIS — Z794 Long term (current) use of insulin: Secondary | ICD-10-CM | POA: Diagnosis not present

## 2019-12-25 DIAGNOSIS — Z961 Presence of intraocular lens: Secondary | ICD-10-CM | POA: Diagnosis not present

## 2019-12-25 LAB — BAYER DCA HB A1C WAIVED: HB A1C (BAYER DCA - WAIVED): 10.1 % — ABNORMAL HIGH (ref <7.0)

## 2019-12-25 LAB — HM DIABETES EYE EXAM

## 2019-12-25 MED ORDER — LANTUS SOLOSTAR 100 UNIT/ML ~~LOC~~ SOPN: 25.0000 [IU] | PEN_INJECTOR | Freq: Every day | SUBCUTANEOUS | 12 refills | Status: DC

## 2019-12-25 NOTE — Assessment & Plan Note (Signed)
Not under good control with A1c going in the wrong direction at 10.1. Will increase her lantus to 25units and recheck 1 week. Continue other medication. Continue to monitor. Check sugars in the AM. Call with any concerns.

## 2019-12-25 NOTE — Progress Notes (Signed)
BP 119/83 (BP Location: Left Arm, Patient Position: Sitting, Cuff Size: Normal)   Pulse 76   Temp 97.9 F (36.6 C) (Oral)   Ht 5' 4.76" (1.645 m)   Wt 185 lb (83.9 kg)   SpO2 97%   BMI 31.01 kg/m    Subjective:    Patient ID: Bonnie Ochoa, female    DOB: [birth year], 54 y.o.   MRN: [id number]  HPI: Bonnie Ochoa is a 54 y.o. female  Chief Complaint  Patient presents with  . Diabetes   DIABETES- has been cheating a bit on her diet. Has been feeling well, but her sugars have been running high Hypoglycemic episodes:no Polydipsia/polyuria: no Visual disturbance: no Chest pain: no Paresthesias: no Glucose Monitoring: yes  Accucheck frequency: occasionally  Fasting glucose: 150+ Taking Insulin?: yes  Long acting insulin: lantus Blood Pressure Monitoring: not checking Retinal Examination: Not up to Date Foot Exam: Not up to Date Diabetic Education: Completed Pneumovax: Up to Date Influenza: Up to Date Aspirin: yes  Relevant past medical, surgical, family and social history reviewed and updated as indicated. Interim medical history since our last visit reviewed. Allergies and medications reviewed and updated.  Review of Systems  Constitutional: Negative.   Respiratory: Negative.   Cardiovascular: Negative.   Musculoskeletal: Negative.   Neurological: Negative.   Psychiatric/Behavioral: Negative.     Per HPI unless specifically indicated above     Objective:    BP 119/83 (BP Location: Left Arm, Patient Position: Sitting, Cuff Size: Normal)   Pulse 76   Temp 97.9 F (36.6 C) (Oral)   Ht 5' 4.76" (1.645 m)   Wt 185 lb (83.9 kg)   SpO2 97%   BMI 31.01 kg/m   Wt Readings from Last 3 Encounters:  12/25/19 185 lb (83.9 kg)  06/02/19 191 lb (86.6 kg)  05/18/19 190 lb 9.6 oz (86.5 kg)    Physical Exam Vitals and nursing note reviewed.  Constitutional:      General: She is not in acute distress.    Appearance: Normal appearance. She is not ill-appearing,  toxic-appearing or diaphoretic.  HENT:     Head: Normocephalic and atraumatic.     Right Ear: External ear normal.     Left Ear: External ear normal.     Nose: Nose normal.     Mouth/Throat:     Mouth: Mucous membranes are moist.     Pharynx: Oropharynx is clear.  Eyes:     General: No scleral icterus.       Right eye: No discharge.        Left eye: No discharge.     Extraocular Movements: Extraocular movements intact.     Conjunctiva/sclera: Conjunctivae normal.     Pupils: Pupils are equal, round, and reactive to light.  Cardiovascular:     Rate and Rhythm: Normal rate and regular rhythm.     Pulses: Normal pulses.     Heart sounds: Normal heart sounds. No murmur. No friction rub. No gallop.   Pulmonary:     Effort: Pulmonary effort is normal. No respiratory distress.     Breath sounds: Normal breath sounds. No stridor. No wheezing, rhonchi or rales.  Chest:     Chest wall: No tenderness.  Musculoskeletal:        General: Normal range of motion.     Cervical back: Normal range of motion and neck supple.  Skin:    General: Skin is warm and dry.     Capillary Refill: Capillary refill  takes less than 2 seconds.     Coloration: Skin is not jaundiced or pale.     Findings: No bruising, erythema, lesion or rash.  Neurological:     General: No focal deficit present.     Mental Status: She is alert and oriented to person, place, and time. Mental status is at baseline.  Psychiatric:        Mood and Affect: Mood normal.        Behavior: Behavior normal.        Thought Content: Thought content normal.        Judgment: Judgment normal.     Results for orders placed or performed in visit on 09/28/19  Microscopic Examination   BLD  Result Value Ref Range   WBC, UA None seen 0 - 5 /hpf   RBC None seen 0 - 2 /hpf   Epithelial Cells (non renal) 0-10 0 - 10 /hpf   Bacteria, UA None seen None seen/Few  Bayer DCA Hb A1c Waived  Result Value Ref Range   HB A1C (BAYER DCA - WAIVED)  9.9 (H) <7.0 %  CBC with Differential/Platelet  Result Value Ref Range   WBC 6.1 3.4 - 10.8 x10E3/uL   RBC 4.50 3.77 - 5.28 x10E6/uL   Hemoglobin 13.8 11.1 - 15.9 g/dL   Hematocrit 40.5 34.0 - 46.6 %   MCV 90 79 - 97 fL   MCH 30.7 26.6 - 33.0 pg   MCHC 34.1 31.5 - 35.7 g/dL   RDW 11.8 11.7 - 15.4 %   Platelets 219 150 - 450 x10E3/uL   Neutrophils 46 Not Estab. %   Lymphs 40 Not Estab. %   Monocytes 9 Not Estab. %   Eos 4 Not Estab. %   Basos 1 Not Estab. %   Neutrophils Absolute 2.8 1.4 - 7.0 x10E3/uL   Lymphocytes Absolute 2.5 0.7 - 3.1 x10E3/uL   Monocytes Absolute 0.5 0.1 - 0.9 x10E3/uL   EOS (ABSOLUTE) 0.2 0.0 - 0.4 x10E3/uL   Basophils Absolute 0.0 0.0 - 0.2 x10E3/uL   Immature Granulocytes 0 Not Estab. %   Immature Grans (Abs) 0.0 0.0 - 0.1 x10E3/uL  Comprehensive metabolic panel  Result Value Ref Range   Glucose 272 (H) 65 - 99 mg/dL   BUN 16 6 - 24 mg/dL   Creatinine, Ser 1.05 (H) 0.57 - 1.00 mg/dL   GFR calc non Af Amer 60 >59 mL/min/1.73   GFR calc Af Amer 69 >59 mL/min/1.73   BUN/Creatinine Ratio 15 9 - 23   Sodium 137 134 - 144 mmol/L   Potassium 3.9 3.5 - 5.2 mmol/L   Chloride 99 96 - 106 mmol/L   CO2 24 20 - 29 mmol/L   Calcium 9.7 8.7 - 10.2 mg/dL   Total Protein 7.7 6.0 - 8.5 g/dL   Albumin 4.2 3.8 - 4.9 g/dL   Globulin, Total 3.5 1.5 - 4.5 g/dL   Albumin/Globulin Ratio 1.2 1.2 - 2.2   Bilirubin Total 0.7 0.0 - 1.2 mg/dL   Alkaline Phosphatase 106 39 - 117 IU/L   AST 11 0 - 40 IU/L   ALT 15 0 - 32 IU/L  Lipid Panel w/o Chol/HDL Ratio  Result Value Ref Range   Cholesterol, Total 214 (H) 100 - 199 mg/dL   Triglycerides 321 (H) 0 - 149 mg/dL   HDL 36 (L) >39 mg/dL   VLDL Cholesterol Cal 57 (H) 5 - 40 mg/dL   LDL Chol Calc (NIH) 121 (H) 0 -  99 mg/dL  Microalbumin, Urine Waived  Result Value Ref Range   Microalb, Ur Waived 80 (H) 0 - 19 mg/L   Creatinine, Urine Waived 300 10 - 300 mg/dL   Microalb/Creat Ratio 30-300 (H) <30 mg/g  TSH  Result Value  Ref Range   TSH 1.420 0.450 - 4.500 uIU/mL  UA/M w/rflx Culture, Routine   Specimen: Blood   BLD  Result Value Ref Range   Specific Gravity, UA 1.025 1.005 - 1.030   pH, UA 5.5 5.0 - 7.5   Color, UA Yellow Yellow   Appearance Ur Clear Clear   Leukocytes,UA Negative Negative   Protein,UA 1+ (A) Negative/Trace   Glucose, UA Negative Negative   Ketones, UA Negative Negative   RBC, UA Negative Negative   Bilirubin, UA Negative Negative   Urobilinogen, Ur 0.2 0.2 - 1.0 mg/dL   Nitrite, UA Negative Negative   Microscopic Examination See below:       Assessment & Plan:   Problem List Items Addressed This Visit      Endocrine   DM (diabetes mellitus), type 2 with renal complications (HCC) - Primary    Not under good control with A1c going in the wrong direction at 10.1. Will increase her lantus to 25units and recheck 1 week. Continue other medication. Continue to monitor. Check sugars in the AM. Call with any concerns.       Relevant Medications   insulin glargine (LANTUS SOLOSTAR) 100 UNIT/ML Solostar Pen   Other Relevant Orders   Bayer DCA Hb A1c Waived       Follow up plan: Return in about 1 week (around 01/01/2020).

## 2019-12-30 ENCOUNTER — Telehealth: Payer: Medicare Other | Admitting: Family Medicine

## 2020-01-03 NOTE — Progress Notes (Signed)
Cardiology Office Note    Date:  01/06/2020   ID:  Bonnie Ochoa, DOB 1966/02/13, MRN [id number]  PCP:  Valerie Roys, DO  Cardiologist:  Ida Rogue, MD  Electrophysiologist:  Virl Axe, MD  Advanced Heart Failure: Glori Bickers, MD  Chief Complaint: Follow-up  History of Present Illness:   Bonnie Ochoa is a 54 y.o. female with history of chronic combined systolic and diastolic CHF secondary to NICM status [employer] post single lead ICD, DM2, CKD stage II, HTN, bipolar disorder, sleep apnea, asthma, PTSD, restless leg syndrome, thyroid nodules, hepatic cirrhosis with ascites requiring prior paracentesis, bipolar disorder, and anxiety who presents for follow-up of her cardiomyopathy.  She was initially diagnosed with heart failure in 2008 while living in [place].  She relocated to [place] in 2017 to be closer to her sister.  Following this, she had multiple hospital admissions and ED visits at [facility] which have previously required brief interruptions in GDMT secondary to AKI.  Stress test was nonischemic in 04/2018.  She was admitted to [facility] in 05/2018 with volume overload requiring IV diuresis.  Echo at that time showed an EF of 25 to 30%, diffuse hypokinesis, grade 2 diastolic dysfunction, trivial aortic insufficiency, at least moderate mitral valve regurgitation, mildly dilated left atrium, mildly dilated RV with mildly reduced RV systolic function, mildly dilated right atrium, moderate to severe tricuspid regurgitation, PASP 45 to 50 mmHg.  Following diuresis, she underwent R/LHC which showed normal coronary arteries, elevated filling pressures, and a cardiac output 3.04 with an index of 1.5.  Following additional diuresis she was discharged home.  She was subsequently referred to the advanced heart failure service.  CPX on 07/16/2018 with moderate to severe limitation due primarily to heart failure and obesity with some mild restriction noted on spirometry.  She was last  seen by the advanced heart failure service virtually in 12/2018 and was doing well from a cardiac perspective.  Her CPAP was noted to be broken at that time.  Weight at home is 199 pounds.  She was taking all medications.  She is followed by the paramedicine service.  She was last seen by Dr. Rockey Situ in 05/2019 and was doing well from a cardiac perspective.  She was having issues with hypoglycemia.  Weight 191 pounds and euvolemic.  No changes were made.  She comes in just generally not feeling well over the past month.  She notes positional dizziness as well as generalized malaise and fatigue.  Her dyspnea is about the same.  No chest pain.  No lower extremity swelling.  She last weighed herself at home approximately 3 weeks ago with a weight of 194 pounds.  Her weight is down 7 pounds today when compared to her last in-person clinic visit in 05/2019.  She does not check her blood pressures at home.  She reports compliance with all medications.  She last checked her blood sugar 3 days prior with a reading greater than 300.  She reports drinking approximately 4 L of fluids on a daily basis.  She has stable 3 pillow orthopnea.  She denies eating salty foods or adding salt to her food.   Labs independently reviewed: 12/2019 - A1c 10.1 09/2018 - TSH normal, TC 214, TG 321, HDL 36, LDL 121, BUN 16, serum creatinine 1.05, potassium 3.9, albumin 4.2, AST/ALT normal, Hgb 13.8, PLT 219 08/2018 - digoxin 0.5   Past Medical History:  Diagnosis Date  . Abdominal pain    a. 05/2018  HIDA scan wnl.  . ADHD   . AICD (automatic cardioverter/defibrillator) present    a. 11/2014 s/p SJM Fortify Assura, single lead AICD (ser# [id number]).  Marland Kitchen Anemia   . Arthritis   . Asthma   . Bipolar 1 disorder ([place])   . Chest pain    a. Hx of cath in [place] - reportedly nl; b. 04/2018 MV: EF 22%, fixed dist ant septal, apical, and inferoapical defects - ? scar vs. attenuation. No ischemia.  . Cirrhosis of liver ([place])   . Coronary artery  disease   . Depression   . Diabetes mellitus without complication ([place])   . Diverticulitis   . Dysrhythmia   . Heart murmur   . HFrEF (heart failure with reduced ejection fraction) ([place])    a. 06/2017 Echo: EF 20-25%, diff HK. Mildly dil LA; b. 05/2018 Echo: EF 25-30%, diff HK, Gr2 DD. Triv AI. Mod MR. Mildly reduced RV fxn. Mod-Sev TR. PASP 45-49mHg.  .Marland KitchenHypertension   . Hypothyroidism   . IBS (irritable bowel syndrome)   . Insomnia   . Migraines   . NICM (nonischemic cardiomyopathy) ([place]    a. EF prev 25%; b. 11/2014 s/p SJM Fortify Assura, single lead AICD (ser# [id number]; c. 06/2017 Echo: EF 20-25%; d. 05/2018 Echo: EF 25-30%, diff HK, Gr2 DD; e. 05/2018 Echo: EF 25-30%; f. 05/2018 Cath: Nl cors. LVEDP 221mg, PCWP 324m. PA 65/40 (52). CO/CI 3.04/1.56; c. 07/2018 CPX: Mod HF limitation.  . OSA (obstructive sleep apnea)    CPAP is broken  . PTSD (post-traumatic stress disorder)   . Restless leg syndrome   . Vertigo     Past Surgical History:  Procedure Laterality Date  . ABDOMINAL HYSTERECTOMY    . CARDIAC CATHETERIZATION    . CATARACT EXTRACTION W/PHACO Right 01/27/2019   Procedure: CATARACT EXTRACTION PHACO AND INTRAOCULAR LENS PLACEMENT (IOC[place]ice PrairieVISION BLUE RIGHT DIABETES;  Surgeon: PorBirder RobsonD;  [facility];  Laterality: Right;  Diabetes - insulin sleep apnea  . COLONOSCOPY WITH PROPOFOL N/A 03/11/2019   Procedure: COLONOSCOPY WITH PROPOFOL;  Surgeon: TahVirgel ManifoldD;  Location: [facility] ENDOSCOPY;  Service: Endoscopy;  Laterality: N/A;  . CORONARY ARTERY BYPASS GRAFT     Pt denies  . ESOPHAGOGASTRODUODENOSCOPY (EGD) WITH PROPOFOL N/A 03/11/2019   Procedure: ESOPHAGOGASTRODUODENOSCOPY (EGD) WITH PROPOFOL;  Surgeon: TahVirgel ManifoldD;  Location: [facility] ENDOSCOPY;  Service: Endoscopy;  Laterality: N/A;  . INSERT / REPLACE / REMOVE PACEMAKER     ICD  . INSERTION OF ICD  2016   St Jude Single chamber ICD  .  RIGHT/LEFT HEART CATH AND CORONARY ANGIOGRAPHY N/A 06/09/2018   Procedure: RIGHT/LEFT HEART CATH AND CORONARY ANGIOGRAPHY;  Surgeon: AriWellington HampshireD;  [facility];  Service: Cardiovascular;  Laterality: N/A;  . TONSILLECTOMY    . TUBAL LIGATION  1980    Current Medications: Current Meds  Medication Sig  . blood glucose meter kit and supplies KIT Dispense based on patient and insurance preference. Use up to four times daily as directed. (FOR ICD-9 250.00, 250.01).  . carvedilol (COREG) 12.5 MG tablet Take 1 tablet (12.5 mg total) by mouth 2 (two) times daily.  . cyclobenzaprine (FLEXERIL) 5 MG tablet TAKE 1 TABLET BY MOUTH THREE TIMES A DAY AS NEEDED FOR MUSCLE SPASMS  . digoxin (LANOXIN) 0.125 MG tablet TAKE 1 TABLET BY MOUTH DAILY  . Dulaglutide (TRULICITY) 3 MG/KT/6.2BWPN Inject 3 mg into the skin once  a week.  Marland Kitchen ENTRESTO 49-51 MG TAKE 1 TABLET BY MOUTH TWICE A DAY  . famotidine (PEPCID) 40 MG tablet TAKE 1 TABLET BY MOUTH EVERY DAY IN THE EVENING  . furosemide (LASIX) 40 MG tablet TAKE 1 TABLET BY MOUTH EVERY DAY  . insulin glargine (LANTUS SOLOSTAR) 100 UNIT/ML Solostar Pen Inject 25 Units into the skin daily at 10 pm.  . Insulin Pen Needle 32G X 6 MM MISC 1 each by Does not apply route daily.  Marland Kitchen KLOR-CON M20 20 MEQ tablet TAKE 2 TABLETS (40 MEQ TOTAL) BY MOUTH 2 (TWO) TIMES DAILY.  Marland Kitchen lamoTRIgine (LAMICTAL) 25 MG tablet TAKE 3 TABLETS BY MOUTH AS DIRECTED. TAKE 1 TABLET AM AND 2 TABLETS PM- TOTAL 75 MG DAILY  . metoCLOPramide (REGLAN) 10 MG tablet TAKE 1 TABLET (10 MG TOTAL) BY MOUTH 3 (THREE) TIMES DAILY WITH MEALS.  Marland Kitchen pantoprazole (PROTONIX) 40 MG tablet TAKE 1 TABLET BY MOUTH EVERY DAY  . PARoxetine (PAXIL) 30 MG tablet Take 1 tablet (30 mg total) by mouth daily.  . risperiDONE (RISPERDAL) 2 MG tablet TAKE 1 TABLET (2 MG TOTAL) BY MOUTH AT BEDTIME.  . rosuvastatin (CRESTOR) 40 MG tablet Take 1 tablet (40 mg total) by mouth daily.  Marland Kitchen spironolactone (ALDACTONE)  25 MG tablet Take 25 mg by mouth daily.  . sucralfate (CARAFATE) 1 g tablet TAKE 1 TABLET (1 G TOTAL) BY MOUTH 4 (FOUR) TIMES DAILY.  Marland Kitchen zolpidem (AMBIEN) 5 MG tablet TAKE 1 TABLET (5 MG TOTAL) BY MOUTH AT BEDTIME AS NEEDED FOR SLEEP.    Allergies:   Levothyroxine   Social History   Socioeconomic History  . Marital status: Married    Spouse name: Not on file  . Number of children: 2  . Years of education: Not on file  . Highest education level: High school graduate  Occupational History    Comment: disabled  Tobacco Use  . Smoking status: Former Smoker    Types: Cigarettes    Quit date: 1985    Years since quitting: 36.4  . Smokeless tobacco: Never Used  . Tobacco comment: quit over  20 years ago   Substance and Sexual Activity  . Alcohol use: Not Currently  . Drug use: Not Currently    Frequency: 1.0 times per week    Types: Marijuana  . Sexual activity: Yes    Birth control/protection: Surgical    Comment: one partner  Other Topics Concern  . Not on file  Social History Narrative   Volunteers occasionally    Social Determinants of Health   Financial Resource Strain:   . Difficulty of Paying Living Expenses:   Food Insecurity:   . Worried About Charity fundraiser in the Last Year:   . Arboriculturist in the Last Year:   Transportation Needs:   . Film/video editor (Medical):   Marland Kitchen Lack of Transportation (Non-Medical):   Physical Activity:   . Days of Exercise per Week:   . Minutes of Exercise per Session:   Stress:   . Feeling of Stress :   Social Connections:   . Frequency of Communication with Friends and Family:   . Frequency of Social Gatherings with Friends and Family:   . Attends Religious Services:   . Active Member of Clubs or Organizations:   . Attends Archivist Meetings:   Marland Kitchen Marital Status:      Family History:  The patient's family history includes Alzheimer's disease in her maternal  grandfather; Asthma in her sister and sister;  Cancer in her sister; Diabetes in her sister and sister; Heart disease in her mother and sister; Hyperlipidemia in her brother and mother; Hypertension in her mother, sister, and sister.  ROS:   Review of Systems  Constitutional: Positive for malaise/fatigue. Negative for chills, diaphoresis, fever and weight loss.  HENT: Negative for congestion.   Eyes: Negative for discharge and redness.  Respiratory: Positive for shortness of breath. Negative for cough, sputum production and wheezing.   Cardiovascular: Negative for chest pain, palpitations, orthopnea, claudication, leg swelling and PND.  Gastrointestinal: Negative for abdominal pain, heartburn, nausea and vomiting.  Musculoskeletal: Negative for falls and myalgias.  Skin: Negative for rash.  Neurological: Positive for dizziness and weakness. Negative for tingling, tremors, sensory change, speech change, focal weakness and loss of consciousness.  Endo/Heme/Allergies: Does not bruise/bleed easily.  Psychiatric/Behavioral: Negative for substance abuse. The patient is not nervous/anxious.   All other systems reviewed and are negative.    EKGs/Labs/Other Studies Reviewed:    Studies reviewed were summarized above. The additional studies were reviewed today:  Cardiopulmonary stress test 07/2018: Conclusion: Exercise testing with gas exchange demonstrates moderate functional impairment when compared to matched sedentary norms. There is a moderate HF limitation with moderately elevated VE/VCO2 slope, indicating worsening short-term prognosis. Further, pre-exercise spirometry with restrictive properties suggests body habitus is contributing to exercise intolerance. Of note, O2pulse (surrogate for stroke volume) was reduced. __________  [facility] 05/2018: 1.  Normal coronary arteries. 2.  Severely reduced LV systolic function by echo.  Left ventricular angiography was not performed. 3.  Right heart catheterization showed severely elevated filling  pressures with a wedge pressure of 32 mmHg, severe pulmonary hypertension with mean pulmonary pressure of 52 mmHg and severely reduced cardiac output at 3.04 with a cardiac index of 1.56.  Recommendations: The patient has nonischemic cardiomyopathy. She continues to be severely volume overloaded.  I am going to resume IV diuresis. I elected to decrease the dose of carvedilol to 6.25 mg twice daily in order to allow initiation of Entresto. If renal function worsens with diuresis, the patient might need short-term inotrope support. __________  2D echo 05/2018: - Left ventricle: The cavity size was severely dilated. Wall  thickness was at the upper limits of normal. Systolic function  was severely reduced. The estimated ejection fraction was in the  range of 25% to 30%. Diffuse hypokinesis. Features are consistent  with a pseudonormal left ventricular filling pattern, with  concomitant abnormal relaxation and increased filling pressure  (grade 2 diastolic dysfunction). Doppler parameters are  consistent with high ventricular filling pressure.  - Aortic valve: Trileaflet; mildly thickened, mildly calcified  leaflets. There was trivial regurgitation.  - Mitral valve: There was at least moderate regurgitation.  - Left atrium: The atrium was mildly dilated.  - Right ventricle: The cavity size was mildly dilated. Wall  thickness was normal. Systolic function was mildly reduced.  - Right atrium: The atrium was mildly dilated.  - Tricuspid valve: There was moderate-severe regurgitation.  - Pulmonary arteries: Systolic pressure was moderately increased,  in the range of 45 mm Hg to 50 mm Hg.  - Inferior vena cava: The vessel was dilated. The respirophasic  diameter changes were blunted (< 50%), consistent with elevated  central venous pressure.  - Pericardium, extracardiac: A small pericardial effusion was  identified.    EKG:  EKG is ordered today.  The EKG ordered  today demonstrates NSR, 87 bpm, nonspecific inferolateral ST-T changes, unchanged  from prior  Recent Labs: 09/28/2019: ALT 15; BUN 16; Creatinine, Ser 1.05; Hemoglobin 13.8; Platelets 219; Potassium 3.9; Sodium 137; TSH 1.420  Recent Lipid Panel    Component Value Date/Time   CHOL 214 (H) 09/28/2019 1508   TRIG 321 (H) 09/28/2019 1508   HDL 36 (L) 09/28/2019 1508   CHOLHDL 5.4 05/09/2018 0500   VLDL 53 (H) 05/09/2018 0500   LDLCALC 121 (H) 09/28/2019 1508    PHYSICAL EXAM:    VS:  BP 90/70 (BP Location: Left Arm, Patient Position: Sitting, Cuff Size: Normal)   Pulse 77   Ht 5' 5"  (1.651 m)   Wt 184 lb 2 oz (83.5 kg)   SpO2 92%   BMI 30.64 kg/m   BMI: Body mass index is 30.64 kg/m.  Physical Exam  Constitutional: She is oriented to person, place, and time. She appears well-developed and well-nourished.  HENT:  Head: Normocephalic and atraumatic.  Eyes: Right eye exhibits no discharge. Left eye exhibits no discharge.  Neck: No JVD present.  Cardiovascular: Normal rate, regular rhythm, S1 normal, S2 normal and normal heart sounds. Exam reveals no distant heart sounds, no friction rub, no midsystolic click and no opening snap.  No murmur heard. Pulses:      Posterior tibial pulses are 2+ on the right side and 2+ on the left side.  Pulmonary/Chest: Effort normal and breath sounds normal. No respiratory distress. She has no decreased breath sounds. She has no wheezes. She has no rales. She exhibits no tenderness.  Abdominal: Soft. She exhibits no distension. There is no abdominal tenderness.  Musculoskeletal:        General: No edema.     Cervical back: Normal range of motion.  Neurological: She is alert and oriented to person, place, and time.  Skin: Skin is warm and dry. No cyanosis. Nails show no clubbing.  Psychiatric: She has a normal mood and affect. Her speech is normal and behavior is normal. Judgment and thought content normal.    Wt Readings from Last 3 Encounters:    01/06/20 184 lb 2 oz (83.5 kg)  12/25/19 185 lb (83.9 kg)  06/02/19 191 lb (86.6 kg)     Orthostatic vital signs: Lying: 104/71, 76 bpm, woozy Sitting: 96/64, 98 bpm Standing: 86/61, 87 bpm, mild wooziness Standing x3 minutes: 92/66, 86 bpm, dizzy  ASSESSMENT & PLAN:   1. Chronic combined systolic and diastolic CHF secondary to NICM: Status post [employer] single lead ICD.  She is euvolemic to possibly dehydrated on exam.  Decrease Entresto to 24/26 mg twice daily.  Otherwise, she will continue current dose carvedilol, furosemide, and spironolactone.  Followed by the advanced heart failure service.  It was recommended she schedule a follow-up appointment with him. It has been almost 3 years since she was last evaluated by EP.  Last remote device check from 07/2018.  Recommend she schedule follow-up with Dr. Caryl Comes. Check BMP and digoxin level.  Schedule echo.  CHF education.  2. HTN: Orthostatic in the office today.  Decrease Entresto as outlined above.  Otherwise, she will continue GDMT as above.  3. CKD stage II: Check BMP to update renal function. Goal potassium 4.0.  4. Obesity with OSA: Her weight is down 7 pounds today when compared to her last in-person clinic visit in 05/2019.  Compliance with CPAP recommended.  5. Poorly controlled diabetes: She reports blood sugars greater than 300 earlier this week.  [facility] will [facility] medical mall for a  stat BMP.  If blood sugars are significantly elevated she will possibly need to proceed to the ED.  Otherwise, I recommend she follow-up with her PCP regarding poorly controlled diabetes.  Disposition: F/u with Dr. Rockey Situ or an APP in 3 months.  She needs to reestablish with EP as it has been nearly 3 years since she was evaluated by them.  She is also overdue for follow-up with the advanced heart failure clinic and has been advised to schedule an appointment with them as well.   Medication Adjustments/Labs and Tests Ordered: Current  medicines are reviewed at length with the patient today.  Concerns regarding medicines are outlined above. Medication changes, Labs and Tests ordered today are summarized above and listed in the Patient Instructions accessible in Encounters.   Signed, Christell Faith, PA-C 01/06/2020 11:58 AM     [place] [address] [place], [place] [address] [phone]

## 2020-01-06 ENCOUNTER — Encounter: Payer: Self-pay | Admitting: Physician Assistant

## 2020-01-06 ENCOUNTER — Ambulatory Visit (INDEPENDENT_AMBULATORY_CARE_PROVIDER_SITE_OTHER): Payer: Medicare Other | Admitting: Physician Assistant

## 2020-01-06 ENCOUNTER — Other Ambulatory Visit: Payer: Self-pay

## 2020-01-06 VITALS — BP 90/70 | HR 77 | Ht 65.0 in | Wt 184.1 lb

## 2020-01-06 DIAGNOSIS — Z95810 Presence of automatic (implantable) cardiac defibrillator: Secondary | ICD-10-CM

## 2020-01-06 DIAGNOSIS — I10 Essential (primary) hypertension: Secondary | ICD-10-CM

## 2020-01-06 DIAGNOSIS — R0602 Shortness of breath: Secondary | ICD-10-CM | POA: Diagnosis not present

## 2020-01-06 DIAGNOSIS — I951 Orthostatic hypotension: Secondary | ICD-10-CM | POA: Diagnosis not present

## 2020-01-06 DIAGNOSIS — E1165 Type 2 diabetes mellitus with hyperglycemia: Secondary | ICD-10-CM

## 2020-01-06 DIAGNOSIS — I428 Other cardiomyopathies: Secondary | ICD-10-CM

## 2020-01-06 DIAGNOSIS — G4733 Obstructive sleep apnea (adult) (pediatric): Secondary | ICD-10-CM | POA: Diagnosis not present

## 2020-01-06 DIAGNOSIS — Z79899 Other long term (current) drug therapy: Secondary | ICD-10-CM | POA: Diagnosis not present

## 2020-01-06 DIAGNOSIS — I5042 Chronic combined systolic (congestive) and diastolic (congestive) heart failure: Secondary | ICD-10-CM | POA: Diagnosis not present

## 2020-01-06 DIAGNOSIS — N182 Chronic kidney disease, stage 2 (mild): Secondary | ICD-10-CM | POA: Diagnosis not present

## 2020-01-06 MED ORDER — ENTRESTO 24-26 MG PO TABS: 1.0000 | ORAL_TABLET | Freq: Two times a day (BID) | ORAL | 3 refills | Status: DC

## 2020-01-06 NOTE — Patient Instructions (Addendum)
Medication Instructions:   Your physician has recommended you: reduce your entresto to 24/26mg , take one tablet by mouth twice daily.  *If you need a refill on your cardiac medications before your next appointment, please call your pharmacy*   Lab Work:  If you have labs (blood work) drawn today and your tests are completely normal, you will receive your results only by: Marland Kitchen MyChart Message (if you have MyChart) OR . A paper copy in the mail If you have any lab test that is abnormal or we need to change your treatment, we will call you to review the results.   Testing/Procedures:  Your physician has requested that you have an echocardiogram. Echocardiography is a painless test that uses sound waves to create images of your heart. It provides your doctor with information about the size and shape of your heart and how well your heart's chambers and valves are working. This procedure takes approximately one hour. There are no restrictions for this procedure.    Follow-Up: At [facility], you and your health needs are our priority.  As part of our continuing mission to provide you with exceptional heart care, we have created designated Provider Care Teams.  These Care Teams include your primary Cardiologist (physician) and Advanced Practice Providers (APPs -  Physician Assistants and Nurse Practitioners) who all work together to provide you with the care you need, when you need it.  We recommend signing up for the patient portal called "MyChart".  Sign up information is provided on this After Visit Summary.  MyChart is used to connect with patients for Virtual Visits (Telemedicine).  Patients are able to view lab/test results, encounter notes, upcoming appointments, etc.  Non-urgent messages can be sent to your provider as well.   To learn more about what you can do with MyChart, go to [email or web address].    Your next appointment:   3 month(s)  The format for your next appointment:    In Person  Provider:   Dr. Mariah Milling or Eula Listen, PA-C    Other Instructions  1. Please schedule a follow up with Dr. Graciela Husbands (its been 3 years since last visit).  2. Please schedule a visit with Dr. Gala Romney in the Advanced Heart Failure Clinic.

## 2020-01-07 ENCOUNTER — Telehealth: Payer: Self-pay

## 2020-01-07 DIAGNOSIS — I5022 Chronic systolic (congestive) heart failure: Secondary | ICD-10-CM

## 2020-01-07 LAB — BASIC METABOLIC PANEL
BUN/Creatinine Ratio: 10 (ref 9–23)
BUN: 16 mg/dL (ref 6–24)
CO2: 23 mmol/L (ref 20–29)
Calcium: 9.5 mg/dL (ref 8.7–10.2)
Chloride: 99 mmol/L (ref 96–106)
Creatinine, Ser: 1.65 mg/dL — ABNORMAL HIGH (ref 0.57–1.00)
GFR calc Af Amer: 40 mL/min/{1.73_m2} — ABNORMAL LOW (ref >59)
GFR calc non Af Amer: 34 mL/min/{1.73_m2} — ABNORMAL LOW (ref >59)
Glucose: 296 mg/dL — ABNORMAL HIGH (ref 65–99)
Potassium: 3.5 mmol/L (ref 3.5–5.2)
Sodium: 139 mmol/L (ref 134–144)

## 2020-01-07 LAB — DIGOXIN LEVEL: Digoxin, Serum: 1.1 ng/mL — ABNORMAL HIGH (ref 0.5–0.9)

## 2020-01-07 NOTE — Telephone Encounter (Signed)
-----   Message from Sondra Barges, PA-C sent at 01/07/2020 11:09 AM EDT ----- Random glucose is elevated with known poorly controlled diabetes.  Kidney function is mildly elevated.  Potassium is normal, though slightly below goal of 4.0.  Digoxin level is a little high.    Recommendations:-Follow up with PCP regarding diabetes control  -Please hold digoxin x 2 days, then resume at lower dose 0.0625 mg daily -Please take an extra KCl 40 mEq x 1, followed by resumption of lower dose KCl 20 mEq daily -Please reduce Lasix to 20 mg daily -Recheck digoxin level and BMP in 1 week -Schedule follow up appointments with advanced heart failure service and EP

## 2020-01-07 NOTE — Telephone Encounter (Signed)
Attempted to call patient. LMTCB 01/07/2020   

## 2020-01-12 MED ORDER — FUROSEMIDE 40 MG PO TABS: 20.0000 mg | ORAL_TABLET | Freq: Every day | ORAL | 2 refills | Status: DC

## 2020-01-12 MED ORDER — DIGOXIN 125 MCG PO TABS: 0.0625 mg | ORAL_TABLET | Freq: Every day | ORAL | 3 refills | Status: DC

## 2020-01-12 NOTE — Telephone Encounter (Signed)
Call to patient to review labs.    Pt verbalized understanding and all questions were answered.  Orders and med list updated per advice.   Advised pt to call for any further questions or concerns.

## 2020-01-13 ENCOUNTER — Encounter: Payer: Self-pay | Admitting: Nurse Practitioner

## 2020-01-19 ENCOUNTER — Other Ambulatory Visit: Payer: Self-pay | Admitting: Psychiatry

## 2020-01-19 ENCOUNTER — Other Ambulatory Visit: Payer: Self-pay | Admitting: Family Medicine

## 2020-01-19 DIAGNOSIS — F3132 Bipolar disorder, current episode depressed, moderate: Secondary | ICD-10-CM

## 2020-01-19 NOTE — Telephone Encounter (Signed)
Requested Prescriptions  Pending Prescriptions Disp Refills  . sucralfate (CARAFATE) 1 g tablet [Pharmacy Med Name: SUCRALFATE 1 GM TABLET] 360 tablet 1    Sig: TAKE 1 TABLET (1 G TOTAL) BY MOUTH 4 (FOUR) TIMES DAILY.     Gastroenterology: Antiacids Passed - 01/19/2020  1:30 AM      Passed - Valid encounter within last 12 months    Recent Outpatient Visits          3 weeks ago Type 2 diabetes mellitus with stage 3a chronic kidney disease, with long-term current use of insulin (HCC)   [facility] [place], Megan P, DO   3 months ago Other cirrhosis of liver (HCC)   [facility] [place], Megan P, DO   8 months ago Type 2 diabetes mellitus with stage 3a chronic kidney disease, with long-term current use of insulin (HCC)   [facility] [place], Megan P, DO   9 months ago Type 2 diabetes mellitus with stage 3 chronic kidney disease, with long-term current use of insulin (HCC)   [facility] [place], Megan P, DO   10 months ago Type 2 diabetes mellitus with stage 3 chronic kidney disease, with long-term current use of insulin (HCC)   [facility] [place], [place], DO      Future Appointments            In 3 days Laural Benes, Oralia Rud, DO [employer], [facility]   In 2 months Gollan, Tollie Pizza, MD [facility] [employer], [facility]

## 2020-01-22 ENCOUNTER — Ambulatory Visit: Payer: Medicare Other | Admitting: Family Medicine

## 2020-02-01 ENCOUNTER — Other Ambulatory Visit: Payer: Self-pay | Admitting: Family Medicine

## 2020-02-01 NOTE — Telephone Encounter (Signed)
Requested medications are due for refill today? Yes  Requested medications are on active medication list?  Yes  Last Refill:    Cyclobenzaprine 10/12/2019  # 60 with one refill Jardiance   # 90 with one refill   Future visit scheduled?  Yes in three days.    Notes to Clinic:  Cyclobenzaprine refill cannot be delegated.  Jardiance - Recent abnormal lab values (01/06/2020)  from labs ordered by Cardiologist.  Patient missed two follow-up appointments - one in May and one in earlier this month.  Patient scheduled for f/u visit in 3 days.

## 2020-02-02 NOTE — Telephone Encounter (Signed)
Routing to provider  

## 2020-02-03 ENCOUNTER — Other Ambulatory Visit: Payer: Self-pay | Admitting: Physician Assistant

## 2020-02-03 DIAGNOSIS — R0600 Dyspnea, unspecified: Secondary | ICD-10-CM

## 2020-02-04 ENCOUNTER — Encounter: Payer: Self-pay | Admitting: Family Medicine

## 2020-02-04 ENCOUNTER — Other Ambulatory Visit: Payer: Self-pay

## 2020-02-04 ENCOUNTER — Ambulatory Visit (INDEPENDENT_AMBULATORY_CARE_PROVIDER_SITE_OTHER): Payer: Medicare Other | Admitting: Family Medicine

## 2020-02-04 VITALS — BP 113/77 | HR 74 | Temp 98.1°F | Wt 181.8 lb

## 2020-02-04 DIAGNOSIS — E1121 Type 2 diabetes mellitus with diabetic nephropathy: Secondary | ICD-10-CM

## 2020-02-04 DIAGNOSIS — E1122 Type 2 diabetes mellitus with diabetic chronic kidney disease: Secondary | ICD-10-CM

## 2020-02-04 DIAGNOSIS — N1831 Chronic kidney disease, stage 3a: Secondary | ICD-10-CM | POA: Diagnosis not present

## 2020-02-04 DIAGNOSIS — Z794 Long term (current) use of insulin: Secondary | ICD-10-CM | POA: Diagnosis not present

## 2020-02-04 MED ORDER — LANTUS SOLOSTAR 100 UNIT/ML ~~LOC~~ SOPN: 28.0000 [IU] | PEN_INJECTOR | Freq: Every day | SUBCUTANEOUS | 12 refills | Status: DC

## 2020-02-04 NOTE — Assessment & Plan Note (Signed)
Still running high- will increase lantus to 28 units daily and recheck by phone/virtual in 1 week. Check AM sugars at least 3x a week in the next week. Continue to monitor.

## 2020-02-04 NOTE — Progress Notes (Signed)
BP 113/77 (BP Location: Left Arm, Patient Position: Sitting, Cuff Size: Normal)   Pulse 74   Temp 98.1 F (36.7 C) (Oral)   Wt 181 lb 12.8 oz (82.5 kg)   SpO2 98%   BMI 30.25 kg/m    Subjective:    Patient ID: Bonnie Ochoa, female    DOB: 02-02-1966, 54 y.o.   MRN: [id number]  HPI: Bonnie Ochoa is a 54 y.o. female  Chief Complaint  Patient presents with  . Diabetes   DIABETES Hypoglycemic episodes: no Polydipsia/polyuria: yes Visual disturbance: yes Chest pain: no Paresthesias: no Glucose Monitoring: yes  Accucheck frequency: occasionally, 1-2 x a week  Fasting glucose: 150s  Post prandial:  3-something Taking Insulin?: yes  Long acting insulin: 25 units lantus Blood Pressure Monitoring: not checking Retinal Examination: Up to Date Foot Exam: Done today Diabetic Education: Completed Pneumovax: Up to Date Influenza: Up to Date Aspirin: yes  Relevant past medical, surgical, family and social history reviewed and updated as indicated. Interim medical history since our last visit reviewed. Allergies and medications reviewed and updated.  Review of Systems  Constitutional: Negative.   Respiratory: Negative.   Cardiovascular: Negative.   Gastrointestinal: Negative.   Musculoskeletal: Negative.   Psychiatric/Behavioral: Negative.     Per HPI unless specifically indicated above     Objective:    BP 113/77 (BP Location: Left Arm, Patient Position: Sitting, Cuff Size: Normal)   Pulse 74   Temp 98.1 F (36.7 C) (Oral)   Wt 181 lb 12.8 oz (82.5 kg)   SpO2 98%   BMI 30.25 kg/m   Wt Readings from Last 3 Encounters:  02/04/20 181 lb 12.8 oz (82.5 kg)  01/06/20 184 lb 2 oz (83.5 kg)  12/25/19 185 lb (83.9 kg)    Physical Exam Constitutional:      General: She is not in acute distress.    Appearance: She is well-developed.  HENT:     Head: Normocephalic and atraumatic.     Right Ear: Hearing normal.     Left Ear: Hearing normal.     Nose: Nose normal.    Eyes:     General: Lids are normal. No scleral icterus.       Right eye: No discharge.        Left eye: No discharge.     Conjunctiva/sclera: Conjunctivae normal.  Pulmonary:     Effort: Pulmonary effort is normal. No respiratory distress.  Musculoskeletal:        General: Normal range of motion.  Skin:    Findings: No rash.  Neurological:     Mental Status: She is alert and oriented to person, place, and time.  Psychiatric:        Mood and Affect: Mood normal.        Speech: Speech normal.        Behavior: Behavior normal.        Thought Content: Thought content normal.        Judgment: Judgment normal.     Results for orders placed or performed in visit on 01/06/20  Basic Metabolic Panel (BMET)  Result Value Ref Range   Glucose 296 (H) 65 - 99 mg/dL   BUN 16 6 - 24 mg/dL   Creatinine, Ser 7.82 (H) 0.57 - 1.00 mg/dL   GFR calc non Af Amer 34 (L) >59 mL/min/1.73   GFR calc Af Amer 40 (L) >59 mL/min/1.73   BUN/Creatinine Ratio 10 9 - 23   Sodium 139 134 -  144 mmol/L   Potassium 3.5 3.5 - 5.2 mmol/L   Chloride 99 96 - 106 mmol/L   CO2 23 20 - 29 mmol/L   Calcium 9.5 8.7 - 10.2 mg/dL  Digoxin level  Result Value Ref Range   Digoxin, Serum 1.1 (H) 0.5 - 0.9 ng/mL      Assessment & Plan:   Problem List Items Addressed This Visit      Endocrine   DM (diabetes mellitus), type 2 with renal complications ([place]) - Primary    Still running high- will increase lantus to 28 units daily and recheck by phone/virtual in 1 week. Check AM sugars at least 3x a week in the next week. Continue to monitor.       Relevant Medications   insulin glargine (LANTUS SOLOSTAR) 100 UNIT/ML Solostar Pen       Follow up plan: Return in about 1 week (around 02/11/2020).

## 2020-02-11 ENCOUNTER — Ambulatory Visit (INDEPENDENT_AMBULATORY_CARE_PROVIDER_SITE_OTHER): Payer: Medicare Other

## 2020-02-11 ENCOUNTER — Other Ambulatory Visit: Payer: Self-pay

## 2020-02-11 DIAGNOSIS — R0600 Dyspnea, unspecified: Secondary | ICD-10-CM

## 2020-02-12 ENCOUNTER — Telehealth (INDEPENDENT_AMBULATORY_CARE_PROVIDER_SITE_OTHER): Payer: Medicare Other | Admitting: Family Medicine

## 2020-02-12 ENCOUNTER — Telehealth: Payer: Self-pay

## 2020-02-12 ENCOUNTER — Encounter: Payer: Self-pay | Admitting: Family Medicine

## 2020-02-12 ENCOUNTER — Telehealth: Payer: Self-pay | Admitting: Family Medicine

## 2020-02-12 DIAGNOSIS — E1121 Type 2 diabetes mellitus with diabetic nephropathy: Secondary | ICD-10-CM

## 2020-02-12 DIAGNOSIS — N1831 Chronic kidney disease, stage 3a: Secondary | ICD-10-CM

## 2020-02-12 DIAGNOSIS — Z794 Long term (current) use of insulin: Secondary | ICD-10-CM | POA: Diagnosis not present

## 2020-02-12 MED ORDER — LANTUS SOLOSTAR 100 UNIT/ML ~~LOC~~ SOPN: 30.0000 [IU] | PEN_INJECTOR | Freq: Every day | SUBCUTANEOUS | 12 refills | Status: DC

## 2020-02-12 NOTE — Progress Notes (Signed)
There were no vitals taken for this visit.   Subjective:    Patient ID: Bonnie Ochoa, female    DOB: [birth year], 54 y.o.   MRN: [id number]  HPI: Bonnie Ochoa is a 54 y.o. female  Chief Complaint  Patient presents with   Diabetes   DIABETES Hypoglycemic episodes:no Polydipsia/polyuria: yes Visual disturbance: no Chest pain: no Paresthesias: no Glucose Monitoring: no  Accucheck frequency: Not Checking Taking Insulin?: no  Long acting insulin: yes 28 units lantus Blood Pressure Monitoring: not checking Retinal Examination: Up to Date Foot Exam: Up to Date Diabetic Education: Completed Pneumovax: Up to Date Influenza: Up to Date Aspirin: yes   Relevant past medical, surgical, family and social history reviewed and updated as indicated. Interim medical history since our last visit reviewed. Allergies and medications reviewed and updated.  Review of Systems  Constitutional: Negative.   Respiratory: Negative.   Cardiovascular: Negative.   Gastrointestinal: Negative.   Endocrine: Positive for polyuria. Negative for cold intolerance, heat intolerance, polydipsia and polyphagia.  Genitourinary: Negative.   Musculoskeletal: Negative.   Psychiatric/Behavioral: Negative.     Per HPI unless specifically indicated above     Objective:    There were no vitals taken for this visit.  Wt Readings from Last 3 Encounters:  02/04/20 181 lb 12.8 oz (82.5 kg)  01/06/20 184 lb 2 oz (83.5 kg)  12/25/19 185 lb (83.9 kg)    Physical Exam Vitals and nursing note reviewed.  Pulmonary:     Effort: Pulmonary effort is normal. No respiratory distress.     Comments: Speaking in full sentences Neurological:     Mental Status: She is alert.  Psychiatric:        Mood and Affect: Mood normal.        Behavior: Behavior normal.        Thought Content: Thought content normal.        Judgment: Judgment normal.     Results for orders placed or performed in visit on 01/06/20  Basic  Metabolic Panel (BMET)  Result Value Ref Range   Glucose 296 (H) 65 - 99 mg/dL   BUN 16 6 - 24 mg/dL   Creatinine, Ser 4.49 (H) 0.57 - 1.00 mg/dL   GFR calc non Af Amer 34 (L) >59 mL/min/1.73   GFR calc Af Amer 40 (L) >59 mL/min/1.73   BUN/Creatinine Ratio 10 9 - 23   Sodium 139 134 - 144 mmol/L   Potassium 3.5 3.5 - 5.2 mmol/L   Chloride 99 96 - 106 mmol/L   CO2 23 20 - 29 mmol/L   Calcium 9.5 8.7 - 10.2 mg/dL  Digoxin level  Result Value Ref Range   Digoxin, Serum 1.1 (H) 0.5 - 0.9 ng/mL      Assessment & Plan:   Problem List Items Addressed This Visit      Endocrine   DM (diabetes mellitus), type 2 with renal complications ([facility])    Has not been checking her sugars. Still having polyuria. No hypoglycemia. Will increase her insulin to 30units and recheck by phone 2 weeks. Strongly encouraged patient to check AM sugars at least 3x in the next 2 weeks so we can better adjust her medicine.       Relevant Medications   insulin glargine (LANTUS SOLOSTAR) 100 UNIT/ML Solostar Pen       Follow up plan: Return in about 2 weeks (around 02/26/2020) for follow up sugars. Virtual OK.    This visit was completed via telephone due  to the restrictions of the COVID-19 pandemic. All issues as above were discussed and addressed but no physical exam was performed. If it was felt that the patient should be evaluated in the office, they were directed there. The patient verbally consented to this visit. Patient was unable to complete an audio/visual visit due to Lack of equipment. Due to the catastrophic nature of the COVID-19 pandemic, this visit was done through audio contact only.  Location of the patient: home  Location of the provider: work  Those involved with this call:   Provider: Olevia Perches, DO  CMA: Verdon Cummins, CMA  Front Desk/Registration: Adela Ports   Time spent on call: 15 minutes on the phone discussing health concerns. 23 minutes total spent in review of  patient's record and preparation of their chart.

## 2020-02-12 NOTE — Telephone Encounter (Signed)
lvm to make this apt.  

## 2020-02-12 NOTE — Telephone Encounter (Signed)
Attempted to call patient. LMTCB 02/12/2020   

## 2020-02-12 NOTE — Assessment & Plan Note (Signed)
Has not been checking her sugars. Still having polyuria. No hypoglycemia. Will increase her insulin to 30units and recheck by phone 2 weeks. Strongly encouraged patient to check AM sugars at least 3x in the next 2 weeks so we can better adjust her medicine.

## 2020-02-12 NOTE — Telephone Encounter (Signed)
-----   Message from [facility], DO sent at 02/12/2020  9:28 AM EDT ----- 2 weeks follow up sugars. Virtual OK

## 2020-02-12 NOTE — Telephone Encounter (Signed)
-----   Message from Sondra Barges, PA-C sent at 02/11/2020  6:15 PM EDT ----- Echo showed reduced pump function of 20 to 25% (normal 55 to 60%), slightly stiffened heart, mildly leaky mitral valve, mild thickening of the aortic valve without evidence of narrowing.  When compared to prior study pump function has not improved despite maximally tolerated optimal guideline directed therapy including Entresto, spironolactone, and carvedilol.  With relative hypotension and orthostasis her Sherryll Burger had to be decreased at her last visit.  It has been approximately 3 years since she was evaluated by EP and is overdue for follow-up with the advanced heart failure clinic.  It is imperative that she keep these appointments which are scheduled for next month.

## 2020-02-16 ENCOUNTER — Other Ambulatory Visit: Payer: Self-pay | Admitting: Family Medicine

## 2020-02-16 NOTE — Telephone Encounter (Signed)
Patient calling to request a refill on Dulaglutide (TRULICITY) 3 MG/0.5ML SOPN and insulin glargine (LANTUS SOLOSTAR) 100 UNIT/ML Solostar Pen [id number] - [place], [place] - [address] [phone]

## 2020-02-17 MED ORDER — LANTUS SOLOSTAR 100 UNIT/ML ~~LOC~~ SOPN: 30.0000 [IU] | PEN_INJECTOR | Freq: Every day | SUBCUTANEOUS | 0 refills | Status: DC

## 2020-02-17 MED ORDER — TRULICITY 3 MG/0.5ML ~~LOC~~ SOAJ: 3.0000 mg | SUBCUTANEOUS | 0 refills | Status: DC

## 2020-02-24 DIAGNOSIS — H2512 Age-related nuclear cataract, left eye: Secondary | ICD-10-CM | POA: Diagnosis not present

## 2020-02-26 ENCOUNTER — Ambulatory Visit: Payer: Medicare Other | Admitting: Family Medicine

## 2020-03-01 NOTE — Telephone Encounter (Signed)
Attempted to call patient. LMTCB 03/01/2020

## 2020-03-02 ENCOUNTER — Ambulatory Visit (INDEPENDENT_AMBULATORY_CARE_PROVIDER_SITE_OTHER): Payer: Medicare Other | Admitting: Family Medicine

## 2020-03-02 ENCOUNTER — Other Ambulatory Visit: Payer: Self-pay

## 2020-03-02 ENCOUNTER — Encounter: Payer: Self-pay | Admitting: Family Medicine

## 2020-03-02 VITALS — BP 110/75 | HR 78 | Temp 98.2°F | Wt 176.6 lb

## 2020-03-02 DIAGNOSIS — Z794 Long term (current) use of insulin: Secondary | ICD-10-CM

## 2020-03-02 DIAGNOSIS — G2581 Restless legs syndrome: Secondary | ICD-10-CM

## 2020-03-02 DIAGNOSIS — N1831 Chronic kidney disease, stage 3a: Secondary | ICD-10-CM | POA: Diagnosis not present

## 2020-03-02 DIAGNOSIS — E559 Vitamin D deficiency, unspecified: Secondary | ICD-10-CM | POA: Diagnosis not present

## 2020-03-02 DIAGNOSIS — E1121 Type 2 diabetes mellitus with diabetic nephropathy: Secondary | ICD-10-CM | POA: Diagnosis not present

## 2020-03-02 DIAGNOSIS — Z862 Personal history of diseases of the blood and blood-forming organs and certain disorders involving the immune mechanism: Secondary | ICD-10-CM

## 2020-03-02 DIAGNOSIS — E039 Hypothyroidism, unspecified: Secondary | ICD-10-CM | POA: Diagnosis not present

## 2020-03-02 MED ORDER — LANTUS SOLOSTAR 100 UNIT/ML ~~LOC~~ SOPN: 35.0000 [IU] | PEN_INJECTOR | Freq: Every day | SUBCUTANEOUS | 0 refills | Status: DC

## 2020-03-02 NOTE — Progress Notes (Signed)
BP 110/75 (BP Location: Left Arm, Patient Position: Sitting, Cuff Size: Normal)   Pulse 78   Temp 98.2 F (36.8 C) (Oral)   Wt 176 lb 9.6 oz (80.1 kg)   SpO2 99%   BMI 29.39 kg/m    Subjective:    Patient ID: Bonnie Ochoa, female    DOB: 1966-01-19, 54 y.o.   MRN: [id number]  HPI: Bonnie Ochoa is a 54 y.o. female  Chief Complaint  Patient presents with  . Diabetes  . restless leg   DIABETES Hypoglycemic episodes:no Polydipsia/polyuria: yes Visual disturbance: no Chest pain: no Paresthesias: no Glucose Monitoring: yes  Accucheck frequency: Daily  Fasting glucose: 250+ Taking Insulin?: yes  Long acting insulin: 30 units Blood Pressure Monitoring: not checking Retinal Examination: up to date Foot Exam: Up to Date Diabetic Education: Completed Pneumovax: Up to Date Influenza: Up to Date Aspirin: yes  RESTLESS LEGS Duration: worse the past few weeks Discomfort description:  Creeping, crawling and cramping Pain: no Location: lower legs Bilateral: yes Symmetric: yes Severity: severe Onset:  sudden Frequency:  constant Symptoms only occur while legs at rest: yes Sudden unintentional leg jerking: yes Bed partner bothered by leg movements: yes LE numbness: yes Decreased sensation: yes Weakness: no Insomnia: no Daytime somnolence: no Fatigue: yes Status: worse   Relevant past medical, surgical, family and social history reviewed and updated as indicated. Interim medical history since our last visit reviewed. Allergies and medications reviewed and updated.  Review of Systems  Constitutional: Negative.   HENT: Negative.   Respiratory: Negative.   Cardiovascular: Negative.   Gastrointestinal: Negative.   Musculoskeletal: Negative.   Neurological: Negative.   Psychiatric/Behavioral: Negative.     Per HPI unless specifically indicated above     Objective:    BP 110/75 (BP Location: Left Arm, Patient Position: Sitting, Cuff Size: Normal)   Pulse 78    Temp 98.2 F (36.8 C) (Oral)   Wt 176 lb 9.6 oz (80.1 kg)   SpO2 99%   BMI 29.39 kg/m   Wt Readings from Last 3 Encounters:  03/02/20 176 lb 9.6 oz (80.1 kg)  02/04/20 181 lb 12.8 oz (82.5 kg)  01/06/20 184 lb 2 oz (83.5 kg)    Physical Exam Vitals and nursing note reviewed.  Constitutional:      General: She is not in acute distress.    Appearance: Normal appearance. She is not ill-appearing, toxic-appearing or diaphoretic.  HENT:     Head: Normocephalic and atraumatic.     Right Ear: External ear normal.     Left Ear: External ear normal.     Nose: Nose normal.     Mouth/Throat:     Mouth: Mucous membranes are moist.     Pharynx: Oropharynx is clear.  Eyes:     General: No scleral icterus.       Right eye: No discharge.        Left eye: No discharge.     Extraocular Movements: Extraocular movements intact.     Conjunctiva/sclera: Conjunctivae normal.     Pupils: Pupils are equal, round, and reactive to light.  Cardiovascular:     Rate and Rhythm: Normal rate and regular rhythm.     Pulses: Normal pulses.     Heart sounds: Normal heart sounds. No murmur heard.  No friction rub. No gallop.   Pulmonary:     Effort: Pulmonary effort is normal. No respiratory distress.     Breath sounds: Normal breath sounds. No stridor. No wheezing,  rhonchi or rales.  Chest:     Chest wall: No tenderness.  Musculoskeletal:        General: Normal range of motion.     Cervical back: Normal range of motion and neck supple.  Skin:    General: Skin is warm and dry.     Capillary Refill: Capillary refill takes less than 2 seconds.     Coloration: Skin is not jaundiced or pale.     Findings: No bruising, erythema, lesion or rash.  Neurological:     General: No focal deficit present.     Mental Status: She is alert and oriented to person, place, and time. Mental status is at baseline.  Psychiatric:        Mood and Affect: Mood normal.        Behavior: Behavior normal.        Thought  Content: Thought content normal.        Judgment: Judgment normal.     Results for orders placed or performed in visit on 03/02/20  Iron and TIBC  Result Value Ref Range   Total Iron Binding Capacity 240 (L) 250 - 450 ug/dL   UIBC 710 626 - 948 ug/dL   Iron 54 27 - 546 ug/dL   Iron Saturation 23 15 - 55 %  Comprehensive metabolic panel  Result Value Ref Range   Glucose 223 (H) 65 - 99 mg/dL   BUN 18 6 - 24 mg/dL   Creatinine, Ser 2.70 (H) 0.57 - 1.00 mg/dL   GFR calc non Af Amer 40 (L) >59 mL/min/1.73   GFR calc Af Amer 46 (L) >59 mL/min/1.73   BUN/Creatinine Ratio 12 9 - 23   Sodium 138 134 - 144 mmol/L   Potassium 3.9 3.5 - 5.2 mmol/L   Chloride 101 96 - 106 mmol/L   CO2 23 20 - 29 mmol/L   Calcium 9.5 8.7 - 10.2 mg/dL   Total Protein 7.8 6.0 - 8.5 g/dL   Albumin 4.4 3.8 - 4.9 g/dL   Globulin, Total 3.4 1.5 - 4.5 g/dL   Albumin/Globulin Ratio 1.3 1.2 - 2.2   Bilirubin Total 0.5 0.0 - 1.2 mg/dL   Alkaline Phosphatase 114 48 - 121 IU/L   AST 14 0 - 40 IU/L   ALT 21 0 - 32 IU/L  TSH  Result Value Ref Range   TSH 1.290 0.450 - 4.500 uIU/mL  VITAMIN D 25 Hydroxy (Vit-D Deficiency, Fractures)  Result Value Ref Range   Vit D, 25-Hydroxy 11.6 (L) 30.0 - 100.0 ng/mL  Ferritin  Result Value Ref Range   Ferritin 269 (H) 15.0 - 150.0 ng/mL  CBC with Differential/Platelet  Result Value Ref Range   WBC 7.1 3.4 - 10.8 x10E3/uL   RBC 4.30 3.77 - 5.28 x10E6/uL   Hemoglobin 12.8 11.1 - 15.9 g/dL   Hematocrit 35.0 09.3 - 46.6 %   MCV 89 79 - 97 fL   MCH 29.8 26.6 - 33.0 pg   MCHC 33.5 31 - 35 g/dL   RDW 81.8 29.9 - 37.1 %   Platelets 257 150 - 450 x10E3/uL   Neutrophils 49 Not Estab. %   Lymphs 39 Not Estab. %   Monocytes 8 Not Estab. %   Eos 3 Not Estab. %   Basos 1 Not Estab. %   Neutrophils Absolute 3.5 1 - 7 x10E3/uL   Lymphocytes Absolute 2.8 0 - 3 x10E3/uL   Monocytes Absolute 0.6 0 - 0 x10E3/uL   EOS (ABSOLUTE)  0.2 0.0 - 0.4 x10E3/uL   Basophils Absolute 0.1 0 - 0  x10E3/uL   Immature Granulocytes 0 Not Estab. %   Immature Grans (Abs) 0.0 0.0 - 0.1 x10E3/uL      Assessment & Plan:   Problem List Items Addressed This Visit      Endocrine   DM (diabetes mellitus), type 2 with renal complications ([facility]) - Primary    Still not under good control. Increase her insulin to 35units qHS and recheck in 2 weeks.       Relevant Medications   insulin glargine (LANTUS SOLOSTAR) 100 UNIT/ML Solostar Pen   Hypothyroid    Rechecking levels today. Await results. Treat as needed.       Relevant Orders   TSH (Completed)    Other Visit Diagnoses    RLS (restless legs syndrome)       New-onset. Will check labs to look for cause. Await results. Treat as needed.    Relevant Orders   Comprehensive metabolic panel (Completed)   History of anemia       Will check labs. Await results. Treat as needed.    Relevant Orders   Iron and TIBC (Completed)   Ferritin (Completed)   CBC with Differential/Platelet (Completed)   Vitamin D deficiency       Will check labs. Await results. Treat as needed.    Relevant Orders   VITAMIN D 25 Hydroxy (Vit-D Deficiency, Fractures) (Completed)       Follow up plan: Return in about 2 weeks (around 03/16/2020) for virtual OK, recheck sugars.

## 2020-03-03 ENCOUNTER — Other Ambulatory Visit: Payer: Self-pay | Admitting: Cardiovascular Disease

## 2020-03-03 LAB — CBC WITH DIFFERENTIAL/PLATELET
Basophils Absolute: 0.1 10*3/uL (ref 0.0–0.2)
Basos: 1 %
EOS (ABSOLUTE): 0.2 10*3/uL (ref 0.0–0.4)
Eos: 3 %
Hematocrit: 38.2 % (ref 34.0–46.6)
Hemoglobin: 12.8 g/dL (ref 11.1–15.9)
Immature Grans (Abs): 0 10*3/uL (ref 0.0–0.1)
Immature Granulocytes: 0 %
Lymphocytes Absolute: 2.8 10*3/uL (ref 0.7–3.1)
Lymphs: 39 %
MCH: 29.8 pg (ref 26.6–33.0)
MCHC: 33.5 g/dL (ref 31.5–35.7)
MCV: 89 fL (ref 79–97)
Monocytes Absolute: 0.6 10*3/uL (ref 0.1–0.9)
Monocytes: 8 %
Neutrophils Absolute: 3.5 10*3/uL (ref 1.4–7.0)
Neutrophils: 49 %
Platelets: 257 10*3/uL (ref 150–450)
RBC: 4.3 x10E6/uL (ref 3.77–5.28)
RDW: 12.3 % (ref 11.7–15.4)
WBC: 7.1 10*3/uL (ref 3.4–10.8)

## 2020-03-03 LAB — TSH: TSH: 1.29 u[IU]/mL (ref 0.450–4.500)

## 2020-03-03 LAB — COMPREHENSIVE METABOLIC PANEL
ALT: 21 IU/L (ref 0–32)
AST: 14 IU/L (ref 0–40)
Albumin/Globulin Ratio: 1.3 (ref 1.2–2.2)
Albumin: 4.4 g/dL (ref 3.8–4.9)
Alkaline Phosphatase: 114 IU/L (ref 48–121)
BUN/Creatinine Ratio: 12 (ref 9–23)
BUN: 18 mg/dL (ref 6–24)
Bilirubin Total: 0.5 mg/dL (ref 0.0–1.2)
CO2: 23 mmol/L (ref 20–29)
Calcium: 9.5 mg/dL (ref 8.7–10.2)
Chloride: 101 mmol/L (ref 96–106)
Creatinine, Ser: 1.47 mg/dL — ABNORMAL HIGH (ref 0.57–1.00)
GFR calc Af Amer: 46 mL/min/{1.73_m2} — ABNORMAL LOW (ref >59)
GFR calc non Af Amer: 40 mL/min/{1.73_m2} — ABNORMAL LOW (ref >59)
Globulin, Total: 3.4 g/dL (ref 1.5–4.5)
Glucose: 223 mg/dL — ABNORMAL HIGH (ref 65–99)
Potassium: 3.9 mmol/L (ref 3.5–5.2)
Sodium: 138 mmol/L (ref 134–144)
Total Protein: 7.8 g/dL (ref 6.0–8.5)

## 2020-03-03 LAB — IRON AND TIBC
Iron Saturation: 23 % (ref 15–55)
Iron: 54 ug/dL (ref 27–159)
Total Iron Binding Capacity: 240 ug/dL — ABNORMAL LOW (ref 250–450)
UIBC: 186 ug/dL (ref 131–425)

## 2020-03-03 LAB — VITAMIN D 25 HYDROXY (VIT D DEFICIENCY, FRACTURES): Vit D, 25-Hydroxy: 11.6 ng/mL — ABNORMAL LOW (ref 30.0–100.0)

## 2020-03-03 LAB — FERRITIN: Ferritin: 269 ng/mL — ABNORMAL HIGH (ref 15–150)

## 2020-03-03 NOTE — Assessment & Plan Note (Signed)
Rechecking levels today. Await results. Treat as needed.  

## 2020-03-03 NOTE — Assessment & Plan Note (Signed)
Still not under good control. Increase her insulin to 35units qHS and recheck in 2 weeks.

## 2020-03-04 ENCOUNTER — Other Ambulatory Visit
Admission: RE | Admit: 2020-03-04 | Discharge: 2020-03-04 | Disposition: A | Discharge status: Home / Self Care | Payer: Medicare Other | Source: Ambulatory Visit | Attending: Ophthalmology | Admitting: Ophthalmology

## 2020-03-04 ENCOUNTER — Encounter: Payer: Self-pay | Admitting: Ophthalmology

## 2020-03-04 ENCOUNTER — Other Ambulatory Visit: Payer: Self-pay

## 2020-03-04 DIAGNOSIS — Z20822 Contact with and (suspected) exposure to covid-19: Secondary | ICD-10-CM | POA: Insufficient documentation

## 2020-03-04 DIAGNOSIS — Z01812 Encounter for preprocedural laboratory examination: Secondary | ICD-10-CM | POA: Insufficient documentation

## 2020-03-04 NOTE — Discharge Instructions (Signed)

## 2020-03-05 LAB — SARS CORONAVIRUS 2 (TAT 6-24 HRS): SARS Coronavirus 2: NEGATIVE

## 2020-03-06 ENCOUNTER — Other Ambulatory Visit: Payer: Self-pay | Admitting: Family Medicine

## 2020-03-06 MED ORDER — VITAMIN D (ERGOCALCIFEROL) 1.25 MG (50000 UNIT) PO CAPS
50000.0000 [IU] | ORAL_CAPSULE | ORAL | 0 refills | Status: DC
Start: 2020-03-06 — End: 2020-06-06

## 2020-03-07 NOTE — Anesthesia Preprocedure Evaluation (Addendum)
Anesthesia Evaluation  Patient identified by MRN, date of birth, ID band Patient awake    Reviewed: NPO status   Airway Mallampati: II  TM Distance: >3 FB Neck ROM: full    Dental no notable dental hx.    Pulmonary asthma (mild) , sleep apnea and Continuous Positive Airway Pressure Ventilation , former smoker,    Pulmonary exam normal        Cardiovascular Exercise Tolerance: Good hypertension, (-) angina+CHF (ef=20%)  Normal cardiovascular exam+ Cardiac Defibrillator   cath: 2019: 1.  Normal coronary arteries. 2.  Severely reduced LV systolic function by echo.  Left ventricular angiography was not performed. 3.  Right heart catheterization showed severely elevated filling pressures with a wedge pressure of 32 mmHg, severe pulmonary hypertension with mean pulmonary pressure of 52 mmHg and severely reduced cardiac output at 3.04 with a cardiac index of 1.56. The patient has nonischemic cardiomyopathy.;  echo: 02/2020: 1. Left ventricular ejection fraction, by estimation, is 20 to 25%. The  left ventricle has severely decreased function. The left ventricle has no  regional wall motion abnormalities. The left ventricular internal cavity  size was mildly dilated. Left  ventricular diastolic parameters are consistent with Grade I diastolic  dysfunction (impaired relaxation).  2. Right ventricular systolic function is normal. The right ventricular  size is not well visualized.  3. The mitral valve is normal in structure. Mild mitral valve  regurgitation.  4. The aortic valve is tricuspid. Aortic valve regurgitation is not  visualized. Mild aortic valve sclerosis is present, with no evidence of  aortic valve stenosis.   ekg: 5/21: nsr;   Neuro/Psych  Headaches, PSYCHIATRIC DISORDERS Anxiety Bipolar Disorder ptsd RLS;  adhd;    GI/Hepatic (+) Cirrhosis   ascites    , hepatic cirrhosis with ascites requiring prior  paracentesis : 2020;  IBS;   Endo/Other  diabetes  Renal/GU CRFRenal disease (ckd2)  negative genitourinary   Musculoskeletal  (+) Arthritis ,   Abdominal   Peds  Hematology negative hematology ROS (+)   Anesthesia Other Findings cards note: 5/21: Dunn, PA; Covid: NEG.  Marijuana 2 weeks ago.  Magnet at bedside.  Reproductive/Obstetrics                            Anesthesia Physical Anesthesia Plan  ASA: III  Anesthesia Plan: MAC   Post-op Pain Management:    Induction:   PONV Risk Score and Plan: 2 and Midazolam and TIVA  Airway Management Planned:   Additional Equipment:   Intra-op Plan:   Post-operative Plan:   Informed Consent: I have reviewed the patients History and Physical, chart, labs and discussed the procedure including the risks, benefits and alternatives for the proposed anesthesia with the patient or authorized representative who has indicated his/her understanding and acceptance.       Plan Discussed with: CRNA  Anesthesia Plan Comments:         Anesthesia Quick Evaluation

## 2020-03-08 ENCOUNTER — Ambulatory Visit: Payer: Medicare Other | Admitting: Anesthesiology

## 2020-03-08 ENCOUNTER — Ambulatory Visit
Admission: RE | Admit: 2020-03-08 | Discharge: 2020-03-08 | Disposition: A | Discharge status: Home / Self Care | Payer: Medicare Other | Attending: Ophthalmology | Admitting: Ophthalmology

## 2020-03-08 ENCOUNTER — Encounter: Payer: Self-pay | Admitting: Ophthalmology

## 2020-03-08 ENCOUNTER — Encounter
Admission: RE | Disposition: A | Discharge status: Home / Self Care | Payer: Self-pay | Source: Home / Self Care | Attending: Ophthalmology

## 2020-03-08 ENCOUNTER — Other Ambulatory Visit: Payer: Self-pay

## 2020-03-08 DIAGNOSIS — G2581 Restless legs syndrome: Secondary | ICD-10-CM | POA: Insufficient documentation

## 2020-03-08 DIAGNOSIS — Z95810 Presence of automatic (implantable) cardiac defibrillator: Secondary | ICD-10-CM | POA: Diagnosis not present

## 2020-03-08 DIAGNOSIS — G4730 Sleep apnea, unspecified: Secondary | ICD-10-CM | POA: Diagnosis not present

## 2020-03-08 DIAGNOSIS — H2512 Age-related nuclear cataract, left eye: Secondary | ICD-10-CM | POA: Insufficient documentation

## 2020-03-08 DIAGNOSIS — K760 Fatty (change of) liver, not elsewhere classified: Secondary | ICD-10-CM | POA: Diagnosis not present

## 2020-03-08 DIAGNOSIS — K219 Gastro-esophageal reflux disease without esophagitis: Secondary | ICD-10-CM | POA: Diagnosis not present

## 2020-03-08 DIAGNOSIS — M1990 Unspecified osteoarthritis, unspecified site: Secondary | ICD-10-CM | POA: Diagnosis not present

## 2020-03-08 DIAGNOSIS — E079 Disorder of thyroid, unspecified: Secondary | ICD-10-CM | POA: Insufficient documentation

## 2020-03-08 DIAGNOSIS — F4310 Post-traumatic stress disorder, unspecified: Secondary | ICD-10-CM | POA: Insufficient documentation

## 2020-03-08 DIAGNOSIS — N189 Chronic kidney disease, unspecified: Secondary | ICD-10-CM | POA: Insufficient documentation

## 2020-03-08 DIAGNOSIS — I509 Heart failure, unspecified: Secondary | ICD-10-CM | POA: Diagnosis not present

## 2020-03-08 DIAGNOSIS — K589 Irritable bowel syndrome without diarrhea: Secondary | ICD-10-CM | POA: Diagnosis not present

## 2020-03-08 DIAGNOSIS — Z794 Long term (current) use of insulin: Secondary | ICD-10-CM | POA: Diagnosis not present

## 2020-03-08 DIAGNOSIS — R251 Tremor, unspecified: Secondary | ICD-10-CM | POA: Diagnosis not present

## 2020-03-08 DIAGNOSIS — J45909 Unspecified asthma, uncomplicated: Secondary | ICD-10-CM | POA: Diagnosis not present

## 2020-03-08 DIAGNOSIS — Z79899 Other long term (current) drug therapy: Secondary | ICD-10-CM | POA: Insufficient documentation

## 2020-03-08 DIAGNOSIS — E1122 Type 2 diabetes mellitus with diabetic chronic kidney disease: Secondary | ICD-10-CM | POA: Insufficient documentation

## 2020-03-08 DIAGNOSIS — Z888 Allergy status to other drugs, medicaments and biological substances status: Secondary | ICD-10-CM | POA: Insufficient documentation

## 2020-03-08 DIAGNOSIS — F329 Major depressive disorder, single episode, unspecified: Secondary | ICD-10-CM | POA: Insufficient documentation

## 2020-03-08 DIAGNOSIS — H25812 Combined forms of age-related cataract, left eye: Secondary | ICD-10-CM | POA: Diagnosis not present

## 2020-03-08 DIAGNOSIS — I130 Hypertensive heart and chronic kidney disease with heart failure and stage 1 through stage 4 chronic kidney disease, or unspecified chronic kidney disease: Secondary | ICD-10-CM | POA: Insufficient documentation

## 2020-03-08 DIAGNOSIS — Z87891 Personal history of nicotine dependence: Secondary | ICD-10-CM | POA: Diagnosis not present

## 2020-03-08 DIAGNOSIS — E7800 Pure hypercholesterolemia, unspecified: Secondary | ICD-10-CM | POA: Diagnosis not present

## 2020-03-08 HISTORY — PX: CATARACT EXTRACTION W/PHACO: SHX586

## 2020-03-08 LAB — GLUCOSE, CAPILLARY
Glucose-Capillary: 144 mg/dL — ABNORMAL HIGH (ref 70–99)
Glucose-Capillary: 153 mg/dL — ABNORMAL HIGH (ref 70–99)

## 2020-03-08 SURGERY — PHACOEMULSIFICATION, CATARACT, WITH IOL INSERTION
Anesthesia: Monitor Anesthesia Care | Site: Eye | Laterality: Left

## 2020-03-08 MED ORDER — ARMC OPHTHALMIC DILATING DROPS
1.0000 "application " | OPHTHALMIC | Status: DC | PRN
  Administered 2020-03-08 (×3): 1 via OPHTHALMIC

## 2020-03-08 MED ORDER — NA CHONDROIT SULF-NA HYALURON 40-17 MG/ML IO SOLN
INTRAOCULAR | Status: DC | PRN
  Administered 2020-03-08: 1 mL via INTRAOCULAR

## 2020-03-08 MED ORDER — MIDAZOLAM HCL 2 MG/2ML IJ SOLN
INTRAMUSCULAR | Status: DC | PRN
  Administered 2020-03-08 (×2): 1 mg via INTRAVENOUS

## 2020-03-08 MED ORDER — FENTANYL CITRATE (PF) 100 MCG/2ML IJ SOLN
INTRAMUSCULAR | Status: DC | PRN
  Administered 2020-03-08 (×2): 50 ug via INTRAVENOUS

## 2020-03-08 MED ORDER — TETRACAINE HCL 0.5 % OP SOLN
1.0000 [drp] | OPHTHALMIC | Status: DC | PRN
  Administered 2020-03-08 (×3): 1 [drp] via OPHTHALMIC

## 2020-03-08 MED ORDER — OXYCODONE HCL 5 MG/5ML PO SOLN: 5.0000 mg | Freq: Once | ORAL | Status: DC | PRN

## 2020-03-08 MED ORDER — MOXIFLOXACIN HCL 0.5 % OP SOLN
OPHTHALMIC | Status: DC | PRN
  Administered 2020-03-08: 0.2 mL via OPHTHALMIC

## 2020-03-08 MED ORDER — LIDOCAINE HCL (PF) 2 % IJ SOLN
INTRAOCULAR | Status: DC | PRN
  Administered 2020-03-08: 1 mL

## 2020-03-08 MED ORDER — OXYCODONE HCL 5 MG PO TABS: 5.0000 mg | ORAL_TABLET | Freq: Once | ORAL | Status: DC | PRN

## 2020-03-08 MED ORDER — LACTATED RINGERS IV SOLN: INTRAVENOUS | Status: DC

## 2020-03-08 MED ORDER — EPINEPHRINE PF 1 MG/ML IJ SOLN
INTRAOCULAR | Status: DC | PRN
  Administered 2020-03-08: 56 mL via OPHTHALMIC

## 2020-03-08 MED ORDER — BRIMONIDINE TARTRATE-TIMOLOL 0.2-0.5 % OP SOLN
OPHTHALMIC | Status: DC | PRN
  Administered 2020-03-08: 1 [drp] via OPHTHALMIC

## 2020-03-08 SURGICAL SUPPLY — 20 items
CANNULA ANT/CHMB 27G (MISCELLANEOUS) ×2 IMPLANT
CANNULA ANT/CHMB 27GA (MISCELLANEOUS) ×4 IMPLANT
GLOVE SURG LX 8.0 MICRO (GLOVE) ×1
GLOVE SURG LX STRL 8.0 MICRO (GLOVE) ×1 IMPLANT
GLOVE SURG TRIUMPH 8.0 PF LTX (GLOVE) ×2 IMPLANT
GOWN STRL REUS W/ TWL LRG LVL3 (GOWN DISPOSABLE) ×2 IMPLANT
GOWN STRL REUS W/TWL LRG LVL3 (GOWN DISPOSABLE) ×4
LENS IOL DIOP 17.5 (Intraocular Lens) ×2 IMPLANT
LENS IOL TECNIS MONO 17.5 (Intraocular Lens) IMPLANT
MARKER SKIN DUAL TIP RULER LAB (MISCELLANEOUS) ×2 IMPLANT
NDL FILTER BLUNT 18X1 1/2 (NEEDLE) ×1 IMPLANT
NEEDLE FILTER BLUNT 18X 1/2SAF (NEEDLE) ×1
NEEDLE FILTER BLUNT 18X1 1/2 (NEEDLE) ×1 IMPLANT
PACK EYE AFTER SURG (MISCELLANEOUS) ×2 IMPLANT
PACK OPTHALMIC (MISCELLANEOUS) ×2 IMPLANT
PACK PORFILIO (MISCELLANEOUS) ×2 IMPLANT
SYR 3ML LL SCALE MARK (SYRINGE) ×2 IMPLANT
SYR TB 1ML LUER SLIP (SYRINGE) ×2 IMPLANT
WATER STERILE IRR 250ML POUR (IV SOLUTION) ×2 IMPLANT
WIPE NON LINTING 3.25X3.25 (MISCELLANEOUS) ×2 IMPLANT

## 2020-03-08 NOTE — Transfer of Care (Signed)
Immediate Anesthesia Transfer of Care Note  Patient: Bonnie Ochoa  Procedure(s) Performed: CATARACT EXTRACTION PHACO AND INTRAOCULAR LENS PLACEMENT (IOC) LEFT DIABETIC 10.28  00:57.8 (Left Eye)  Patient Location: PACU  Anesthesia Type: MAC  Level of Consciousness: awake, alert  and patient cooperative  Airway and Oxygen Therapy: Patient Spontanous Breathing and Patient connected to supplemental oxygen  Post-op Assessment: Post-op Vital signs reviewed, Patient's Cardiovascular Status Stable, Respiratory Function Stable, Patent Airway and No signs of Nausea or vomiting  Post-op Vital Signs: Reviewed and stable  Complications: No complications documented.

## 2020-03-08 NOTE — Anesthesia Postprocedure Evaluation (Signed)
Anesthesia Post Note  Patient: Bonnie Ochoa  Procedure(s) Performed: CATARACT EXTRACTION PHACO AND INTRAOCULAR LENS PLACEMENT (IOC) LEFT DIABETIC 10.28  00:57.8 (Left Eye)     Patient location during evaluation: PACU Anesthesia Type: MAC Level of consciousness: awake and alert Pain management: pain level controlled Vital Signs Assessment: post-procedure vital signs reviewed and stable Respiratory status: spontaneous breathing, nonlabored ventilation, respiratory function stable and patient connected to nasal cannula oxygen Cardiovascular status: stable and blood pressure returned to baseline Postop Assessment: no apparent nausea or vomiting Anesthetic complications: no   No complications documented.  Fidel Levy

## 2020-03-08 NOTE — Op Note (Signed)
PREOPERATIVE DIAGNOSIS:  Nuclear sclerotic cataract of the left eye.   POSTOPERATIVE DIAGNOSIS:  Nuclear sclerotic cataract of the left eye.   OPERATIVE PROCEDURE:@   SURGEON:  Galen Manila, MD.   ANESTHESIA:  Anesthesiologist: Orrin Brigham, MD CRNA: Jennelle Human, CRNA  1.      Managed anesthesia care. 2.     0.28ml of Shugarcaine was instilled following the paracentesis   COMPLICATIONS:  None.   TECHNIQUE:   Stop and chop   DESCRIPTION OF PROCEDURE:  The patient was examined and consented in the preoperative holding area where the aforementioned topical anesthesia was applied to the left eye and then brought back to the Operating Room where the left eye was prepped and draped in the usual sterile ophthalmic fashion and a lid speculum was placed. A paracentesis was created with the side port blade and the anterior chamber was filled with viscoelastic. A near clear corneal incision was performed with the steel keratome. A continuous curvilinear capsulorrhexis was performed with a cystotome followed by the capsulorrhexis forceps. Hydrodissection and hydrodelineation were carried out with BSS on a blunt cannula. The lens was removed in a stop and chop  technique and the remaining cortical material was removed with the irrigation-aspiration handpiece. The capsular bag was inflated with viscoelastic and the Technis [id number] lens was placed in the capsular bag without complication. The remaining viscoelastic was removed from the eye with the irrigation-aspiration handpiece. The wounds were hydrated. The anterior chamber was flushed with BSS and the eye was inflated to physiologic pressure. 0.71ml Vigamox was placed in the anterior chamber. The wounds were found to be water tight. The eye was dressed with Combigan. The patient was given protective glasses to wear throughout the day and a shield with which to sleep tonight. The patient was also given drops with which to begin a drop regimen  today and will follow-up with me in one day. Implant Name Type Inv. Item Serial No. Manufacturer Lot No. LRB No. Used Action  LENS IOL DIOP 17.5 - [id number] Intraocular Lens LENS IOL DIOP 17.5 [id number] [employer]  Left 1 Implanted    Procedure(s): CATARACT EXTRACTION PHACO AND INTRAOCULAR LENS PLACEMENT (IOC) LEFT DIABETIC 10.28  00:57.8 (Left)  Electronically signed: Galen Manila 03/08/2020 7:44 AM

## 2020-03-08 NOTE — H&P (Signed)
All labs reviewed. Abnormal studies sent to patients PCP when indicated.  Previous H&P reviewed, patient examined, there are NO CHANGES.  Bonnie Riccardi Porfilio7/27/20217:20 AM

## 2020-03-08 NOTE — Anesthesia Procedure Notes (Signed)
Procedure Name: MAC Date/Time: 03/08/2020 7:24 AM Performed by: Nyoka Cowden, CRNA Pre-anesthesia Checklist: Patient identified, Emergency Drugs available, Suction available, Timeout performed and Patient being monitored Patient Re-evaluated:Patient Re-evaluated prior to induction Oxygen Delivery Method: Nasal cannula Placement Confirmation: positive ETCO2

## 2020-03-09 ENCOUNTER — Encounter: Payer: Self-pay | Admitting: Ophthalmology

## 2020-03-09 NOTE — Telephone Encounter (Signed)
Call to patient to review echo results.    Pt verbalized understanding and has no further questions at this time.    Advised pt to call for any further questions or concerns.  No further orders.   confirmed appt w Dr. Graciela Husbands 8/10.

## 2020-03-14 ENCOUNTER — Other Ambulatory Visit: Payer: Self-pay | Admitting: Psychiatry

## 2020-03-14 DIAGNOSIS — F5105 Insomnia due to other mental disorder: Secondary | ICD-10-CM

## 2020-03-22 ENCOUNTER — Encounter: Payer: Medicare Other | Admitting: Internal Medicine

## 2020-03-23 ENCOUNTER — Encounter: Payer: Self-pay | Admitting: Internal Medicine

## 2020-03-25 ENCOUNTER — Encounter: Payer: Self-pay | Admitting: Family Medicine

## 2020-03-25 ENCOUNTER — Telehealth (INDEPENDENT_AMBULATORY_CARE_PROVIDER_SITE_OTHER): Payer: Medicare Other | Admitting: Family Medicine

## 2020-03-25 DIAGNOSIS — Z539 Procedure and treatment not carried out, unspecified reason: Secondary | ICD-10-CM

## 2020-03-25 NOTE — Progress Notes (Signed)
Called patient at scheduled time. Went to VM. LMOM to call back. Did not call back. Appointment will be rescheduled.

## 2020-03-25 NOTE — Progress Notes (Deleted)
   There were no vitals taken for this visit.   Subjective:    Patient ID: Bonnie Ochoa, female    DOB: [birth year], 54 y.o.   MRN: [id number]  HPI: Bonnie Ochoa is a 54 y.o. female  Chief Complaint  Patient presents with  . Diabetes    Relevant past medical, surgical, family and social history reviewed and updated as indicated. Interim medical history since our last visit reviewed. Allergies and medications reviewed and updated.  Review of Systems  Per HPI unless specifically indicated above     Objective:    There were no vitals taken for this visit.  Wt Readings from Last 3 Encounters:  03/08/20 174 lb (78.9 kg)  03/02/20 176 lb 9.6 oz (80.1 kg)  02/04/20 181 lb 12.8 oz (82.5 kg)    Physical Exam  Results for orders placed or performed during the hospital encounter of 03/08/20  Glucose, capillary  Result Value Ref Range   Glucose-Capillary 144 (H) 70 - 99 mg/dL  Glucose, capillary  Result Value Ref Range   Glucose-Capillary 153 (H) 70 - 99 mg/dL      Assessment & Plan:   Problem List Items Addressed This Visit    None       Follow up plan: No follow-ups on file.

## 2020-03-29 ENCOUNTER — Encounter (HOSPITAL_COMMUNITY): Payer: Self-pay | Admitting: Internal Medicine

## 2020-03-29 ENCOUNTER — Encounter: Payer: Self-pay | Admitting: Family Medicine

## 2020-03-29 ENCOUNTER — Ambulatory Visit (HOSPITAL_COMMUNITY)
Admission: RE | Admit: 2020-03-29 | Discharge: 2020-03-29 | Disposition: A | Discharge status: Home / Self Care | Payer: Medicare Other | Source: Ambulatory Visit | Attending: Internal Medicine | Admitting: Internal Medicine

## 2020-03-29 ENCOUNTER — Other Ambulatory Visit: Payer: Self-pay

## 2020-03-29 VITALS — BP 130/90 | HR 77 | Wt 174.6 lb

## 2020-03-29 DIAGNOSIS — I5042 Chronic combined systolic (congestive) and diastolic (congestive) heart failure: Secondary | ICD-10-CM | POA: Insufficient documentation

## 2020-03-29 DIAGNOSIS — G4733 Obstructive sleep apnea (adult) (pediatric): Secondary | ICD-10-CM | POA: Diagnosis not present

## 2020-03-29 DIAGNOSIS — Z6829 Body mass index (BMI) 29.0-29.9, adult: Secondary | ICD-10-CM | POA: Diagnosis not present

## 2020-03-29 DIAGNOSIS — M1990 Unspecified osteoarthritis, unspecified site: Secondary | ICD-10-CM | POA: Diagnosis not present

## 2020-03-29 DIAGNOSIS — K7460 Unspecified cirrhosis of liver: Secondary | ICD-10-CM | POA: Insufficient documentation

## 2020-03-29 DIAGNOSIS — N182 Chronic kidney disease, stage 2 (mild): Secondary | ICD-10-CM

## 2020-03-29 DIAGNOSIS — Z79899 Other long term (current) drug therapy: Secondary | ICD-10-CM | POA: Insufficient documentation

## 2020-03-29 DIAGNOSIS — Z8249 Family history of ischemic heart disease and other diseases of the circulatory system: Secondary | ICD-10-CM | POA: Insufficient documentation

## 2020-03-29 DIAGNOSIS — G2581 Restless legs syndrome: Secondary | ICD-10-CM | POA: Diagnosis not present

## 2020-03-29 DIAGNOSIS — E039 Hypothyroidism, unspecified: Secondary | ICD-10-CM | POA: Diagnosis not present

## 2020-03-29 DIAGNOSIS — I313 Pericardial effusion (noninflammatory): Secondary | ICD-10-CM | POA: Diagnosis not present

## 2020-03-29 DIAGNOSIS — G4700 Insomnia, unspecified: Secondary | ICD-10-CM | POA: Diagnosis not present

## 2020-03-29 DIAGNOSIS — I130 Hypertensive heart and chronic kidney disease with heart failure and stage 1 through stage 4 chronic kidney disease, or unspecified chronic kidney disease: Secondary | ICD-10-CM | POA: Insufficient documentation

## 2020-03-29 DIAGNOSIS — I10 Essential (primary) hypertension: Secondary | ICD-10-CM | POA: Diagnosis not present

## 2020-03-29 DIAGNOSIS — Z87891 Personal history of nicotine dependence: Secondary | ICD-10-CM | POA: Insufficient documentation

## 2020-03-29 DIAGNOSIS — F4310 Post-traumatic stress disorder, unspecified: Secondary | ICD-10-CM | POA: Insufficient documentation

## 2020-03-29 DIAGNOSIS — E876 Hypokalemia: Secondary | ICD-10-CM | POA: Diagnosis not present

## 2020-03-29 DIAGNOSIS — K589 Irritable bowel syndrome without diarrhea: Secondary | ICD-10-CM | POA: Insufficient documentation

## 2020-03-29 DIAGNOSIS — Z95810 Presence of automatic (implantable) cardiac defibrillator: Secondary | ICD-10-CM | POA: Diagnosis not present

## 2020-03-29 DIAGNOSIS — I428 Other cardiomyopathies: Secondary | ICD-10-CM | POA: Diagnosis not present

## 2020-03-29 DIAGNOSIS — Z833 Family history of diabetes mellitus: Secondary | ICD-10-CM | POA: Insufficient documentation

## 2020-03-29 DIAGNOSIS — I5022 Chronic systolic (congestive) heart failure: Secondary | ICD-10-CM | POA: Diagnosis not present

## 2020-03-29 DIAGNOSIS — J45909 Unspecified asthma, uncomplicated: Secondary | ICD-10-CM | POA: Diagnosis not present

## 2020-03-29 DIAGNOSIS — Z794 Long term (current) use of insulin: Secondary | ICD-10-CM | POA: Insufficient documentation

## 2020-03-29 DIAGNOSIS — F319 Bipolar disorder, unspecified: Secondary | ICD-10-CM | POA: Insufficient documentation

## 2020-03-29 DIAGNOSIS — I2510 Atherosclerotic heart disease of native coronary artery without angina pectoris: Secondary | ICD-10-CM | POA: Insufficient documentation

## 2020-03-29 DIAGNOSIS — Z8349 Family history of other endocrine, nutritional and metabolic diseases: Secondary | ICD-10-CM | POA: Insufficient documentation

## 2020-03-29 DIAGNOSIS — I081 Rheumatic disorders of both mitral and tricuspid valves: Secondary | ICD-10-CM | POA: Diagnosis not present

## 2020-03-29 DIAGNOSIS — Z888 Allergy status to other drugs, medicaments and biological substances status: Secondary | ICD-10-CM | POA: Insufficient documentation

## 2020-03-29 LAB — BASIC METABOLIC PANEL
Anion gap: 10 (ref 5–15)
BUN: 14 mg/dL (ref 6–20)
CO2: 27 mmol/L (ref 22–32)
Calcium: 9.6 mg/dL (ref 8.9–10.3)
Chloride: 101 mmol/L (ref 98–111)
Creatinine, Ser: 1.42 mg/dL — ABNORMAL HIGH (ref 0.44–1.00)
GFR calc Af Amer: 48 mL/min — ABNORMAL LOW (ref >60)
GFR calc non Af Amer: 41 mL/min — ABNORMAL LOW (ref >60)
Glucose, Bld: 205 mg/dL — ABNORMAL HIGH (ref 70–99)
Potassium: 3.7 mmol/L (ref 3.5–5.1)
Sodium: 138 mmol/L (ref 135–145)

## 2020-03-29 NOTE — Progress Notes (Addendum)
[facility]  NOTE  Referring Physician: Carmela Rima NP  Primary Care: Dr Rosita Kea Primary Cardiologist: Dr Rockey Situ  Nephrology: Dr Billey Chang HF MD: Dr Haroldine Laws  HPI: Bonnie Ochoa was being referred to the [facility] at the request of Bonnie Bayley NP for heart failure consultation.   Bonnie Ochoa is a 54 year old with a history of NICM, chronic systolic heart failure,St Jude single chamber ICD,  DMII, HTN, bipolar disorder, OSA, PTSD, depression, morbid obesity, former heavy smoker, and cirrhosis. Initially diagnosed with HF in 2008 while living in [place]. In 2017 she relocated to [place] to be closer to her sister.   Admitted to [facility] in June 07, 2018 with chest pain and shortness of breath. Diuresed with IV lasix and later underwent RHC/LHC. She has elevated filling pressures and lowe cardiac index was 1.5. Normal coronaries   Echo 7/21 EF 20-25% RV ok.   Today she returns for HF follow up. Saw [name] in 5/21 and Entresto decreased to 24/26 bid due to low BP. Says she feels better since Entresto cut back. Can clean around the house but SOB with going to mailbox. Unable to go to store due to SOB - says her wife does most of the shopping. No edema, orthopnea or PND. Struggles with restless legs and tremor. Does get dizzy frequently  CorVue:   CPX 07/16/2018  Peak VO2: 11.8 (66% predicted peak VO2) VE/VCO2 slope: 37 OUES: 1.12 Peak RER: 1.09 Moderate limitation with moderately elevated VE/VCO2.   Cardiac Test 05/2018 RHC/LHC   Normal coronary arteries. PA 65/40 Mean 52  PCWP 32 PVR 6.6 CO 3.04 CI 1.56.   ECHO 06/08/2018  Left ventricle: The cavity size was severely dilated. Wall   thickness was at the upper limits of normal. Systolic function   was severely reduced. The estimated ejection fraction was in the   range of 25% to 30%. Diffuse hypokinesis. Features are consistent   with a pseudonormal left ventricular filling pattern, with   concomitant  abnormal relaxation and increased filling pressure   (grade 2 diastolic dysfunction). Doppler parameters are   consistent with high ventricular filling pressure. - Aortic valve: Trileaflet; mildly thickened, mildly calcified   leaflets. There was trivial regurgitation. - Mitral valve: There was at least moderate regurgitation. - Left atrium: The atrium was mildly dilated. - Right ventricle: The cavity size was mildly dilated. Wall   thickness was normal. Systolic function was mildly reduced. - Right atrium: The atrium was mildly dilated. - Tricuspid valve: There was moderate-severe regurgitation. - Pulmonary arteries: Systolic pressure was moderately increased,   in the range of 45 mm Hg to 50 mm Hg. - Inferior vena cava: The vessel was dilated. The respirophasic   diameter changes were blunted (< 50%), consistent with elevated   central venous pressure. - Pericardium, extracardiac: A small pericardial effusion was   identified.    Past Medical History:  Diagnosis Date  . Abdominal pain    a. 05/2018 HIDA scan wnl.  . ADHD   . AICD (automatic cardioverter/defibrillator) present    a. 11/2014 s/p SJM Fortify Assura, single lead AICD (ser# [id number]).  Marland Kitchen Anemia   . Arthritis   . Asthma   . Bipolar 1 disorder ([place])   . Chest pain    a. Hx of cath in [place] - reportedly nl; b. 04/2018 MV: EF 22%, fixed dist ant septal, apical, and inferoapical defects - ? scar vs. attenuation. No ischemia.  . Cirrhosis of  liver ([place])   . Coronary artery disease   . Depression   . Diabetes mellitus without complication ([place])   . Diverticulitis   . Dysrhythmia   . Heart murmur   . HFrEF (heart failure with reduced ejection fraction) ([place])    a. 06/2017 Echo: EF 20-25%, diff HK. Mildly dil LA; b. 05/2018 Echo: EF 25-30%, diff HK, Gr2 DD. Triv AI. Mod MR. Mildly reduced RV fxn. Mod-Sev TR. PASP 45-73mHg.  .Marland KitchenHypertension   . Hypothyroidism   . IBS (irritable bowel syndrome)   . Insomnia   . Migraines     . NICM ([place]ischemic cardiomyopathy) ([place]    a. EF prev 25%; b. 11/2014 s/p SJM Fortify Assura, single lead AICD (ser# [id number]; c. 06/2017 Echo: EF 20-25%; d. 05/2018 Echo: EF 25-30%, diff HK, Gr2 DD; e. 05/2018 Echo: EF 25-30%; f. 05/2018 Cath: Nl cors. LVEDP 296mg, PCWP 3274m. PA 65/40 (52). CO/CI 3.04/1.56; c. 07/2018 CPX: Mod HF limitation.  . OSA (obstructive sleep apnea)    CPAP is broken  . PTSD (post-traumatic stress disorder)   . Restless leg syndrome   . Vertigo     Current Outpatient Medications  Medication Sig Dispense Refill  . blood glucose meter kit and supplies KIT Dispense based on patient and insurance preference. Use up to four times daily as directed. (FOR ICD-9 250.00, 250.01). 1 each 0  . carvedilol (COREG) 12.5 MG tablet Take 12.5 mg by mouth 2 (two) times daily with a meal.    . cyclobenzaprine (FLEXERIL) 5 MG tablet TAKE 1 TABLET BY MOUTH THREE TIMES A DAY AS NEEDED FOR MUSCLE SPASMS 60 tablet 1  . digoxin (LANOXIN) 0.125 MG tablet Take 0.5 tablets (0.0625 mg total) by mouth daily. 90 tablet 3  . Dulaglutide (TRULICITY) 3 MG/WH/6.7RFPN Inject 0.5 mLs (3 mg total) into the skin once a week. 2 mL 0  . DUREZOL 0.05 % EMUL     . famotidine (PEPCID) 40 MG tablet TAKE 1 TABLET BY MOUTH EVERY DAY IN THE EVENING 90 tablet 3  . furosemide (LASIX) 20 MG tablet Take 1 tablet (20 mg total) by mouth daily. 90 tablet 0  . ILEVRO 0.3 % ophthalmic suspension     . insulin glargine (LANTUS SOLOSTAR) 100 UNIT/ML Solostar Pen Inject 35 Units into the skin daily at 10 pm. 5 pen 0  . Insulin Pen Needle 32G X 6 MM MISC 1 each by Does not apply route daily. 100 each 12  . KLOR-CON M20 20 MEQ tablet TAKE 2 TABLETS (40 MEQ TOTAL) BY MOUTH 2 (TWO) TIMES DAILY. 360 tablet 2  . lamoTRIgine (LAMICTAL) 25 MG tablet TAKE 3 TABLETS BY MOUTH AS DIRECTED. TAKE 1 TABLET AM AND 2 TABLETS PM- TOTAL 75 MG DAILY 270 tablet 0  . metoCLOPramide (REGLAN) 10 MG tablet TAKE 1 TABLET (10 MG TOTAL) BY MOUTH 3  (THREE) TIMES DAILY WITH MEALS. 90 tablet 1  . pantoprazole (PROTONIX) 40 MG tablet TAKE 1 TABLET BY MOUTH EVERY DAY 90 tablet 1  . PARoxetine (PAXIL) 30 MG tablet Take 1 tablet (30 mg total) by mouth daily. 90 tablet 1  . risperiDONE (RISPERDAL) 2 MG tablet TAKE 1 TABLET (2 MG TOTAL) BY MOUTH AT BEDTIME. 90 tablet 0  . rosuvastatin (CRESTOR) 40 MG tablet Take 1 tablet (40 mg total) by mouth daily. 90 tablet 1  . sacubitril-valsartan (ENTRESTO) 24-26 MG Take 1 tablet by mouth 2 (two) times daily. 60 tablet 3  . spironolactone (ALDACTONE) 25 MG  tablet Take 25 mg by mouth daily.    . sucralfate (CARAFATE) 1 g tablet TAKE 1 TABLET (1 G TOTAL) BY MOUTH 4 (FOUR) TIMES DAILY. 360 tablet 3  . Vitamin D, Ergocalciferol, (DRISDOL) 1.25 MG (50000 UNIT) CAPS capsule Take 1 capsule (50,000 Units total) by mouth every 7 (seven) days. 12 capsule 0  . zolpidem (AMBIEN) 5 MG tablet TAKE 1 TABLET (5 MG TOTAL) BY MOUTH AT BEDTIME AS NEEDED FOR SLEEP. 30 tablet 1   No current facility-administered medications for this encounter.    Allergies  Allergen Reactions  . Levothyroxine Rash      Social History   Socioeconomic History  . Marital status: Married    Spouse name: Not on file  . Number of children: 2  . Years of education: Not on file  . Highest education level: High school graduate  Occupational History    Comment: disabled  Tobacco Use  . Smoking status: Former Smoker    Types: Cigarettes    Quit date: 1985    Years since quitting: 36.6  . Smokeless tobacco: Never Used  . Tobacco comment: quit over  20 years ago   Vaping Use  . Vaping Use: Never used  Substance and Sexual Activity  . Alcohol use: Not Currently  . Drug use: Yes    Frequency: 1.0 times per week    Types: Marijuana  . Sexual activity: Yes    Birth control/protection: Surgical    Comment: one partner  Other Topics Concern  . Not on file  Social History Narrative   Volunteers occasionally    Social Determinants of  Health   Financial Resource Strain:   . Difficulty of Paying Living Expenses:   Food Insecurity:   . Worried About Charity fundraiser in the Last Year:   . Arboriculturist in the Last Year:   Transportation Needs:   . Film/video editor (Medical):   Marland Kitchen Lack of Transportation (Non-Medical):   Physical Activity:   . Days of Exercise per Week:   . Minutes of Exercise per Session:   Stress:   . Feeling of Stress :   Social Connections:   . Frequency of Communication with Friends and Family:   . Frequency of Social Gatherings with Friends and Family:   . Attends Religious Services:   . Active Member of Clubs or Organizations:   . Attends Archivist Meetings:   Marland Kitchen Marital Status:   Intimate Partner Violence:   . Fear of Current or Ex-Partner:   . Emotionally Abused:   Marland Kitchen Physically Abused:   . Sexually Abused:       Family History  Problem Relation Age of Onset  . Hypertension Mother   . Hyperlipidemia Mother   . Heart disease Mother   . Hypertension Sister   . Asthma Sister   . Heart disease Sister   . Diabetes Sister   . Cancer Sister   . Alzheimer's disease Maternal Grandfather   . Hyperlipidemia Brother   . Asthma Sister   . Hypertension Sister   . Diabetes Sister     Vitals:   03/29/20 1307  BP: 130/90  Pulse: 77  SpO2: 99%  Weight: 79.2 kg (174 lb 9.6 oz)   Wt Readings from Last 3 Encounters:  03/29/20 79.2 kg (174 lb 9.6 oz)  03/08/20 78.9 kg (174 lb)  03/02/20 80.1 kg (176 lb 9.6 oz)    PHYSICAL EXAM: General:  Weak appearing. + tremor No  resp difficulty HEENT: normal Neck: supple. no JVD. Carotids 2+ bilat; no bruits. No lymphadenopathy or thryomegaly appreciated. Cor: PMI nondisplaced. Regular rate & rhythm. No rubs, gallops or murmurs. Lungs: clear Abdomen: obese soft, nontender, nondistended. No hepatosplenomegaly. No bruits or masses. Good bowel sounds. Extremities: no cyanosis, clubbing, rash, edema Neuro: alert & orientedx3,  cranial nerves grossly intact. moves all 4 extremities w/o difficulty. Affect pleasant   ASSESSMENT & PLAN:  1. Chronic Systolic/Diastolic  Heart Failure , NICM. St Jude single chamber ICD. 05/2018 RHC/LHC cors normal. Cardiac output 3. Cardiac Index 1.56.  - CPX 07/2018 -slope 37, RER 1.1  VO 2 11.8  - ECHO 06/08/2018 EF 25-30% Grade II DD. - Echo 7/21 EF 20-25% RV ok.  - Stable NYHA III-IIIb - Continue spironolactone to 25 mg daily  - Continue carvedilol 12.5 mg twice a day. - Continue digoxin 0.125 mg daily.  - Continue entresto 24/26 bid mg twice a day. (decreased from 49/51 in 5/21 due to low BP) - Will not titrate SGLT2i or titrate other GDMT with frequent dizziness - I remain concerned about low output physiology. Although we have discussed advanced therapies in the past, I am not sure she would be a good candidate - Ideally would like to send her to [facility] but not with COVID surge. Will have her start by walking 5 minutes bid and move up to 73mn bid and see if this helps build some stamina - See back 2-3 months  2. HTN  - BP ok. See above 3. CKD Stage II  - repeat BMET today 4. DMII -On insulin per PCP.  5. Obesity  Body mass index is 29.05 kg/m.  6. Hypokalemia - labs today  DGlori BickersMD 1:12 PM

## 2020-03-29 NOTE — Patient Instructions (Signed)
Labs done today, your results will be available in MyChart, we will contact you for abnormal readings.   Walk for 5 minutes twice a day for 2 weeks.  Then walk for 10 minutes twice a day after that   Follow up in 2-3 months   If you have any questions or concerns before your next appointment please send [place] a message through [place] or call our office at [phone].    TO LEAVE A MESSAGE FOR THE NURSE SELECT OPTION 2, PLEASE LEAVE A MESSAGE INCLUDING: . YOUR NAME . DATE OF BIRTH . CALL BACK NUMBER . REASON FOR CALL**this is important as we prioritize the call backs  YOU WILL RECEIVE A CALL BACK THE SAME DAY AS LONG AS YOU CALL BEFORE 4:00 PM   At the [facility], you and your health needs are our priority. As part of our continuing mission to provide you with exceptional heart care, we have created designated Provider Care Teams. These Care Teams include your primary Cardiologist (physician) and Advanced Practice Providers (APPs- Physician Assistants and Nurse Practitioners) who all work together to provide you with the care you need, when you need it.   You may see any of the following providers on your designated Care Team at your next follow up: Marland Kitchen Dr Arvilla Meres . Dr Marca Ancona . Tonye Becket, NP . Robbie Lis, PA . Karle Plumber, PharmD   Please be sure to bring in all your medications bottles to every appointment.

## 2020-03-31 ENCOUNTER — Other Ambulatory Visit: Payer: Self-pay | Admitting: Family Medicine

## 2020-03-31 NOTE — Telephone Encounter (Signed)
Requested Prescriptions  Pending Prescriptions Disp Refills   rosuvastatin (CRESTOR) 40 MG tablet [Pharmacy Med Name: ROSUVASTATIN CALCIUM 40 MG TAB] 90 tablet 1    Sig: TAKE 1 TABLET BY MOUTH EVERY DAY     Cardiovascular:  Antilipid - Statins Failed - 03/31/2020  1:43 AM      Failed - Total Cholesterol in normal range and within 360 days    Cholesterol, Total  Date Value Ref Range Status  09/28/2019 214 (H) 100 - 199 mg/dL Final         Failed - LDL in normal range and within 360 days    LDL Chol Calc (NIH)  Date Value Ref Range Status  09/28/2019 121 (H) 0 - 99 mg/dL Final         Failed - HDL in normal range and within 360 days    HDL  Date Value Ref Range Status  09/28/2019 36 (L) >39 mg/dL Final         Failed - Triglycerides in normal range and within 360 days    Triglycerides  Date Value Ref Range Status  09/28/2019 321 (H) 0 - 149 mg/dL Final         Passed - Patient is not pregnant      Passed - Valid encounter within last 12 months    Recent Outpatient Visits          6 days ago Procedure not carried out   [facility], Megan P, DO   4 weeks ago Type 2 diabetes mellitus with stage 3a chronic kidney disease, with long-term current use of insulin (HCC)   [facility] [place], Megan P, DO   1 month ago Type 2 diabetes mellitus with stage 3a chronic kidney disease, with long-term current use of insulin (HCC)   [facility] [place], Megan P, DO   1 month ago Type 2 diabetes mellitus with stage 3a chronic kidney disease, with long-term current use of insulin (HCC)   [facility] [place], Megan P, DO   3 months ago Type 2 diabetes mellitus with stage 3a chronic kidney disease, with long-term current use of insulin (HCC)   [facility] [place], [place], DO      Future Appointments            In 6 days Johnson, Oralia Rud, DO [facility], [facility]   In 1 week Gollan, Tollie Pizza, MD [facility], [facility]

## 2020-04-06 ENCOUNTER — Other Ambulatory Visit: Payer: Self-pay

## 2020-04-06 ENCOUNTER — Ambulatory Visit (INDEPENDENT_AMBULATORY_CARE_PROVIDER_SITE_OTHER): Payer: Medicare Other | Admitting: Family Medicine

## 2020-04-06 ENCOUNTER — Encounter: Payer: Self-pay | Admitting: Family Medicine

## 2020-04-06 VITALS — BP 120/82 | HR 79 | Temp 98.7°F | Wt 178.6 lb

## 2020-04-06 DIAGNOSIS — F4312 Post-traumatic stress disorder, chronic: Secondary | ICD-10-CM

## 2020-04-06 DIAGNOSIS — N1831 Chronic kidney disease, stage 3a: Secondary | ICD-10-CM | POA: Diagnosis not present

## 2020-04-06 DIAGNOSIS — F319 Bipolar disorder, unspecified: Secondary | ICD-10-CM | POA: Diagnosis not present

## 2020-04-06 DIAGNOSIS — E1121 Type 2 diabetes mellitus with diabetic nephropathy: Secondary | ICD-10-CM

## 2020-04-06 DIAGNOSIS — Z794 Long term (current) use of insulin: Secondary | ICD-10-CM

## 2020-04-06 LAB — BAYER DCA HB A1C WAIVED: HB A1C (BAYER DCA - WAIVED): 8.5 % — ABNORMAL HIGH (ref <7.0)

## 2020-04-06 MED ORDER — INSULIN PEN NEEDLE 32G X 6 MM MISC: 1.0000 | Freq: Every day | 12 refills | Status: DC

## 2020-04-06 MED ORDER — LANTUS SOLOSTAR 100 UNIT/ML ~~LOC~~ SOPN: 35.0000 [IU] | PEN_INJECTOR | Freq: Every day | SUBCUTANEOUS | 3 refills | Status: DC

## 2020-04-06 MED ORDER — EMPAGLIFLOZIN 25 MG PO TABS: 25.0000 mg | ORAL_TABLET | Freq: Every day | ORAL | 1 refills | Status: DC

## 2020-04-06 MED ORDER — TRULICITY 4.5 MG/0.5ML ~~LOC~~ SOAJ
4.5000 mg | SUBCUTANEOUS | 3 refills | Status: DC
Start: 2020-04-06 — End: 2020-05-09

## 2020-04-06 NOTE — Patient Instructions (Signed)
Restart the jardiance.  Continue the 35 units of lantus Increase to the 4.5 mg of trulicity weekly (new Rx at pharmacy) Call Dr. Elna Breslow whenever. Referral is in.

## 2020-04-06 NOTE — Assessment & Plan Note (Signed)
Needs referral back to psychiatry. Made today. 

## 2020-04-06 NOTE — Assessment & Plan Note (Signed)
Needs referral back to psychiatry. Made today.

## 2020-04-06 NOTE — Assessment & Plan Note (Signed)
Doing much better with A1c of 8.5. Will increase trulicity to 4.5 and restart jardiance. Continue lantus. Recheck 1 month. Call with any concerns.

## 2020-04-06 NOTE — Progress Notes (Signed)
BP 120/82 (BP Location: Left Arm, Patient Position: Sitting, Cuff Size: Normal)   Pulse 79   Temp 98.7 F (37.1 C) (Oral)   Wt 178 lb 9.6 oz (81 kg)   SpO2 98%   BMI 29.72 kg/m    Subjective:    Patient ID: Bonnie Ochoa, female    DOB: October 13, 1965, 54 y.o.   MRN: [id number]  HPI: Jeanette Rauth is a 54 y.o. female  Chief Complaint  Patient presents with  . Diabetes   DIABETES Hypoglycemic episodes:no Polydipsia/polyuria: no Visual disturbance: no Chest pain: no Paresthesias: yes Glucose Monitoring: yes  Accucheck frequency: occasionally Taking Insulin?: yes Blood Pressure Monitoring: not checking Retinal Examination: Up to Date Foot Exam: Up to Date Diabetic Education: Completed Pneumovax: Up to Date Influenza: Not up to Date Aspirin: yes  Relevant past medical, surgical, family and social history reviewed and updated as indicated. Interim medical history since our last visit reviewed. Allergies and medications reviewed and updated.  Review of Systems  Constitutional: Negative.   Respiratory: Negative.   Cardiovascular: Negative.   Gastrointestinal: Negative.   Musculoskeletal: Negative.   Neurological: Negative.   Psychiatric/Behavioral: Negative.     Per HPI unless specifically indicated above     Objective:    BP 120/82 (BP Location: Left Arm, Patient Position: Sitting, Cuff Size: Normal)   Pulse 79   Temp 98.7 F (37.1 C) (Oral)   Wt 178 lb 9.6 oz (81 kg)   SpO2 98%   BMI 29.72 kg/m   Wt Readings from Last 3 Encounters:  04/06/20 178 lb 9.6 oz (81 kg)  03/29/20 174 lb 9.6 oz (79.2 kg)  03/08/20 174 lb (78.9 kg)    Physical Exam Vitals and nursing note reviewed.  Constitutional:      General: She is not in acute distress.    Appearance: Normal appearance. She is not ill-appearing, toxic-appearing or diaphoretic.  HENT:     Head: Normocephalic and atraumatic.     Right Ear: External ear normal.     Left Ear: External ear normal.      Nose: Nose normal.     Mouth/Throat:     Mouth: Mucous membranes are moist.     Pharynx: Oropharynx is clear.  Eyes:     General: No scleral icterus.       Right eye: No discharge.        Left eye: No discharge.     Extraocular Movements: Extraocular movements intact.     Conjunctiva/sclera: Conjunctivae normal.     Pupils: Pupils are equal, round, and reactive to light.  Cardiovascular:     Rate and Rhythm: Normal rate and regular rhythm.     Pulses: Normal pulses.     Heart sounds: Normal heart sounds. No murmur heard.  No friction rub. No gallop.   Pulmonary:     Effort: Pulmonary effort is normal. No respiratory distress.     Breath sounds: Normal breath sounds. No stridor. No wheezing, rhonchi or rales.  Chest:     Chest wall: No tenderness.  Musculoskeletal:        General: Normal range of motion.     Cervical back: Normal range of motion and neck supple.  Skin:    General: Skin is warm and dry.     Capillary Refill: Capillary refill takes less than 2 seconds.     Coloration: Skin is not jaundiced or pale.     Findings: No bruising, erythema, lesion or rash.  Neurological:  General: No focal deficit present.     Mental Status: She is alert and oriented to person, place, and time. Mental status is at baseline.  Psychiatric:        Mood and Affect: Mood normal.        Behavior: Behavior normal.        Thought Content: Thought content normal.        Judgment: Judgment normal.     Results for orders placed or performed during the hospital encounter of 03/29/20  Basic metabolic panel  Result Value Ref Range   Sodium 138 135 - 145 mmol/L   Potassium 3.7 3.5 - 5.1 mmol/L   Chloride 101 98 - 111 mmol/L   CO2 27 22 - 32 mmol/L   Glucose, Bld 205 (H) 70 - 99 mg/dL   BUN 14 6 - 20 mg/dL   Creatinine, Ser 2.99 (H) 0.44 - 1.00 mg/dL   Calcium 9.6 8.9 - 24.2 mg/dL   GFR calc non Af Amer 41 (L) >60 mL/min   GFR calc Af Amer 48 (L) >60 mL/min   Anion gap 10 5 - 15        Assessment & Plan:   Problem List Items Addressed This Visit      Endocrine   DM (diabetes mellitus), type 2 with renal complications (HCC) - Primary    Doing much better with A1c of 8.5. Will increase trulicity to 4.5 and restart jardiance. Continue lantus. Recheck 1 month. Call with any concerns.       Relevant Medications   empagliflozin (JARDIANCE) 25 MG TABS tablet   insulin glargine (LANTUS SOLOSTAR) 100 UNIT/ML Solostar Pen   Dulaglutide (TRULICITY) 4.5 MG/0.5ML SOPN   Other Relevant Orders   Bayer DCA Hb A1c Waived     Other   Bipolar 1 disorder (HCC)    Needs referral back to psychiatry. Made today.      Relevant Orders   Ambulatory referral to Psychiatry   Chronic post-traumatic stress disorder (PTSD)    Needs referral back to psychiatry. Made today.      Relevant Orders   Ambulatory referral to Psychiatry       Follow up plan: Return in about 4 weeks (around 05/04/2020).

## 2020-04-12 NOTE — Progress Notes (Signed)
Cardiology Office Note  Date:  04/13/2020   ID:  Bonnie Ochoa, DOB Aug 06, 1966, MRN [id number]  PCP:  Bonnie Roys, DO   Chief Complaint  Patient presents with  . office visit    3 monthF/U; Meds verbally reviewed with patient.    HPI:  Bonnie Ochoa is a 54 yo woman with PMH Medical and appt noncompliance, Substance abuse-chronic /marijuana bipolar,  anxiety diabetes,  hypertension,  asthma,  Obstructive sleep apnea, restless leg syndrome Nonischemic cardiomyopathy, history of ICD,  ejection fraction 25%, 05/2018 Normal coronary artery anemia Previous alcohol problem (patient denies any coronary disease on prior cardiac catheterization)  ascites, cirrhosis, History of paracentesis Previously on pain medication at home, oxycodone She presents today for follow-up of her chronic systolic CHF  Bonnie Ochoa in 5/21 and Entresto decreased to 24/26 bid due to low BP Echo 7/21 EF 20-25% RV ok.  Followed in the advanced heart failure clinic, seen 2 months ago - Stable NYHA III-IIIb spironolactone to 25 mg daily   carvedilol 12.5 mg twice a day.  digoxin 0.125 mg daily.  entresto 24/26 bid mg twice a day.  Overall she reports doing very well breathing getting better Eating out less, more home cooking Weight way down  No significant leg swelling, no abdominal bloating, does not feel she has extra fluid  Weight changes: 11/2017: weight 210 lbs 05/2019: 191 Weight today 177  Lab work reviewed with her HBA1c 12 down to 8.5 Vit D low  Active, walking , increasing scale 5 minutes up to 10 minutes Does not have good long walking endurance yet  EKG personally reviewed by myself on todays visit Shows normal sinus rhythm rate 77 bpm nonspecific T wave abnormality V6, 1 aVL Unchanged  Other past medical history reviewed hospitalization October 25, 2018 for renal failure CR 2.15 Felt to be secondary to dehydration and improved with IV fluids She was continued on Lasix at  discharge  Other past medical history reviewed Previous hospitalization November 2018 for hyperglycemia nonketotic coma She had acute renal failure and hyponatremia  Hospital admission 09/10/16 numerous sx on arrival to ER chest pain, shortness of breath, abdominal pain, dysuria, urinary frequency, syncope 2 today with lightheadedness, nausea and vomiting, and diarrhea , sob TBili 2.4, had paracentesis, diuresis D/c on lasix 60 BID  Hospital admission 09/17/16: chest pain, ABD pain fevers, chills, chest pain, shortness of breath, vomiting and diarrhea. "ran out of meds" HTN, acute on chronic systolic CHF  Seen in the emergency room 05/19/2016 for abdominal pain, chest pain BNP in the hospital 3700 acute on chronic CHF, Had 20 L diuresis  CT scan consistent with moderate abdominal ascites, fatty liver Aortic atherosclerosis   PMH:   has a past medical history of Abdominal pain, ADHD, AICD (automatic cardioverter/defibrillator) present, Anemia, Arthritis, Asthma, Bipolar 1 disorder (Bonnie Ochoa), Chest pain, Cirrhosis of liver (Bonnie Ochoa), Coronary artery disease, Depression, Diabetes mellitus without complication (Bonnie Ochoa), Diverticulitis, Dysrhythmia, Heart murmur, HFrEF (heart failure with reduced ejection fraction) (Bonnie Ochoa), Hypertension, Hypothyroidism, IBS (irritable bowel syndrome), Insomnia, Migraines, NICM (nonischemic cardiomyopathy) (Bonnie Ochoa), OSA (obstructive sleep apnea), PTSD (post-traumatic stress disorder), Restless leg syndrome, and Vertigo.  PSH:    Past Surgical History:  Procedure Laterality Date  . ABDOMINAL HYSTERECTOMY    . CARDIAC CATHETERIZATION    . CATARACT EXTRACTION W/PHACO Right 01/27/2019   Procedure: CATARACT EXTRACTION PHACO AND INTRAOCULAR LENS PLACEMENT ([place])  VISION BLUE RIGHT DIABETES;  Surgeon: Bonnie Robson, MD;  Location: [facility];  Service: Ophthalmology;  Laterality:  Right;  Diabetes - insulin sleep apnea  . CATARACT EXTRACTION W/PHACO Left 03/08/2020    Procedure: CATARACT EXTRACTION PHACO AND INTRAOCULAR LENS PLACEMENT ([facility]) LEFT DIABETIC 10.28  00:57.8;  Surgeon: Bonnie Robson, MD;  Location: [facility];  Service: Ophthalmology;  Laterality: Left;  . COLONOSCOPY WITH PROPOFOL N/A 03/11/2019   Procedure: COLONOSCOPY WITH PROPOFOL;  Surgeon: Bonnie Manifold, MD;  Location: [facility];  Service: Endoscopy;  Laterality: N/A;  . CORONARY ARTERY BYPASS GRAFT     Pt denies  . ESOPHAGOGASTRODUODENOSCOPY (EGD) WITH PROPOFOL N/A 03/11/2019   Procedure: ESOPHAGOGASTRODUODENOSCOPY (EGD) WITH PROPOFOL;  Surgeon: Bonnie Manifold, MD;  Location: [facility];  Service: Endoscopy;  Laterality: N/A;  . INSERT / REPLACE / REMOVE PACEMAKER     ICD  . INSERTION OF ICD  2016   St Jude Single chamber ICD  . RIGHT/LEFT HEART CATH AND CORONARY ANGIOGRAPHY N/A 06/09/2018   Procedure: RIGHT/LEFT HEART CATH AND CORONARY ANGIOGRAPHY;  Surgeon: Bonnie Hampshire, MD;  Location: [facility];  Service: Cardiovascular;  Laterality: N/A;  . TONSILLECTOMY    . TUBAL LIGATION  1980    Current Outpatient Medications  Medication Sig Dispense Refill  . blood glucose meter kit and supplies KIT Dispense based on patient and insurance preference. Use up to four times daily as directed. (FOR ICD-9 250.00, 250.01). 1 each 0  . carvedilol (COREG) 12.5 MG tablet Take 12.5 mg by mouth 2 (two) times daily with a meal.    . cyclobenzaprine (FLEXERIL) 5 MG tablet TAKE 1 TABLET BY MOUTH THREE TIMES A DAY AS NEEDED FOR MUSCLE SPASMS 60 tablet 1  . digoxin (LANOXIN) 0.125 MG tablet Take 0.5 tablets (0.0625 mg total) by mouth daily. 90 tablet 3  . Dulaglutide (TRULICITY) 4.5 HE/5.2DP SOPN Inject 0.5 mLs (4.5 mg total) as directed once a week. 2 mL 3  . DUREZOL 0.05 % EMUL Apply 1 drop to eye daily.     . empagliflozin (JARDIANCE) 25 MG TABS tablet Take 1 tablet (25 mg total) by mouth daily. 90 tablet 1  . famotidine (PEPCID) 40 MG tablet TAKE 1 TABLET  BY MOUTH EVERY DAY IN THE EVENING 90 tablet 3  . furosemide (LASIX) 20 MG tablet Take 1 tablet (20 mg total) by mouth daily. 90 tablet 0  . ILEVRO 0.3 % ophthalmic suspension Place 1 drop into the left eye daily.     . insulin glargine (LANTUS SOLOSTAR) 100 UNIT/ML Solostar Pen Inject 35 Units into the skin daily at 10 pm. 15 mL 3  . Insulin Pen Needle 32G X 6 MM MISC 1 each by Does not apply route daily. 100 each 12  . KLOR-CON M20 20 MEQ tablet TAKE 2 TABLETS (40 MEQ TOTAL) BY MOUTH 2 (TWO) TIMES DAILY. 360 tablet 2  . lamoTRIgine (LAMICTAL) 25 MG tablet TAKE 3 TABLETS BY MOUTH AS DIRECTED. TAKE 1 TABLET AM AND 2 TABLETS PM- TOTAL 75 MG DAILY 270 tablet 0  . metoCLOPramide (REGLAN) 10 MG tablet TAKE 1 TABLET (10 MG TOTAL) BY MOUTH 3 (THREE) TIMES DAILY WITH MEALS. 90 tablet 1  . pantoprazole (PROTONIX) 40 MG tablet TAKE 1 TABLET BY MOUTH EVERY DAY 90 tablet 1  . PARoxetine (PAXIL) 30 MG tablet Take 1 tablet (30 mg total) by mouth daily. 90 tablet 1  . risperiDONE (RISPERDAL) 2 MG tablet TAKE 1 TABLET (2 MG TOTAL) BY MOUTH AT BEDTIME. 90 tablet 0  . rosuvastatin (CRESTOR) 40 MG tablet TAKE 1 TABLET BY  MOUTH EVERY DAY 90 tablet 1  . sacubitril-valsartan (ENTRESTO) 24-26 MG Take 1 tablet by mouth 2 (two) times daily. 60 tablet 3  . spironolactone (ALDACTONE) 25 MG tablet Take 25 mg by mouth daily.    . sucralfate (CARAFATE) 1 g tablet TAKE 1 TABLET (1 G TOTAL) BY MOUTH 4 (FOUR) TIMES DAILY. 360 tablet 3  . Vitamin D, Ergocalciferol, (DRISDOL) 1.25 MG (50000 UNIT) CAPS capsule Take 1 capsule (50,000 Units total) by mouth every 7 (seven) days. 12 capsule 0  . zolpidem (AMBIEN) 5 MG tablet TAKE 1 TABLET (5 MG TOTAL) BY MOUTH AT BEDTIME AS NEEDED FOR SLEEP. 30 tablet 1   No current facility-administered medications for this visit.     Allergies:   Levothyroxine   Social History:  The patient  reports that she quit smoking about 36 years ago. Her smoking use included cigarettes. She has never used  smokeless tobacco. She reports previous alcohol use. She reports current drug use. Frequency: 1.00 time per week. Drug: Marijuana.   Family History:   family history includes Alzheimer's disease in her maternal grandfather; Asthma in her sister and sister; Cancer in her sister; Diabetes in her sister and sister; Heart disease in her mother and sister; Hyperlipidemia in her brother and mother; Hypertension in her mother, sister, and sister.    Review of Systems: Review of Systems  Constitutional: Negative.   HENT: Negative.   Respiratory: Negative.   Cardiovascular: Negative.   Gastrointestinal: Negative.   Musculoskeletal: Negative.   Neurological: Negative.   Psychiatric/Behavioral: Negative.   All other systems reviewed and are negative.    PHYSICAL EXAM: VS:  BP 120/84 (BP Location: Left Arm, Patient Position: Sitting, Cuff Size: Normal)   Pulse 77   Ht 5' 5"  (1.651 m)   Wt 177 lb (80.3 kg)   SpO2 97%   BMI 29.45 kg/m  , BMI Body mass index is 29.45 kg/m.  Constitutional:  oriented to person, place, and time. No distress.  HENT:  Head: Grossly normal Eyes:  no discharge. No scleral icterus.  Neck: No JVD, no carotid bruits  Cardiovascular: Regular rate and rhythm, no murmurs appreciated Pulmonary/Chest: Clear to auscultation bilaterally, no wheezes or rails Abdominal: Soft.  no distension.  no tenderness.  Musculoskeletal: Normal range of motion Neurological:  normal muscle tone. Coordination normal. No atrophy Skin: Skin warm and dry Psychiatric: normal affect, pleasant   Recent Labs: 03/02/2020: ALT 21; Hemoglobin 12.8; Platelets 257; TSH 1.290 03/29/2020: BUN 14; Creatinine, Ser 1.42; Potassium 3.7; Sodium 138    Lipid Panel Lab Results  Component Value Date   CHOL 214 (H) 09/28/2019   HDL 36 (L) 09/28/2019   LDLCALC 121 (H) 09/28/2019   TRIG 321 (H) 09/28/2019      Wt Readings from Last 3 Encounters:  04/13/20 177 lb (80.3 kg)  04/06/20 178 lb 9.6 oz  (81 kg)  03/29/20 174 lb 9.6 oz (79.2 kg)     ASSESSMENT AND PLAN:  Problem List Items Addressed This Visit      Cardiology Problems   Chronic systolic heart failure (HCC) - Primary (Chronic)   Relevant Orders   EKG 12-Lead   Digoxin level   NICM (nonischemic cardiomyopathy) ([place])   Relevant Orders   EKG 12-Lead   Digoxin level    Other Visit Diagnoses    Essential hypertension       Relevant Orders   EKG 12-Lead   CKD (chronic kidney disease), stage II  OSA (obstructive sleep apnea)       Poorly controlled diabetes mellitus ([place])       ICD (implantable cardioverter-defibrillator) in place       Relevant Orders   EKG 12-Lead   Medication management       Relevant Orders   Digoxin level     Nonischemic cardiomyopathy Appears relatively euvolemic, Will check a digoxin level again today No medication changes made Weight trending downward, she has moderated her diet, more active  Diabetes type 2 with complications Hemoglobin A1c dramatically improved was greater than 12 now 8.5 Complements given, encouraged her to keep up the good work Discussed diet restriction Medication compliance  Bipolar 1 disorder/PTSD Has been referred to psychiatry by primary care  Cirrhosis Followed by GI, alcohol cessation recommended     Total encounter time more than 25 minutes  Greater than 50% was spent in counseling and coordination of care with the patient    Signed, Esmond Plants, M.D., Ph.D. [place], [place]

## 2020-04-13 ENCOUNTER — Ambulatory Visit (INDEPENDENT_AMBULATORY_CARE_PROVIDER_SITE_OTHER): Payer: Medicare Other | Admitting: Cardiovascular Disease

## 2020-04-13 ENCOUNTER — Encounter: Payer: Self-pay | Admitting: Cardiovascular Disease

## 2020-04-13 ENCOUNTER — Other Ambulatory Visit: Payer: Self-pay

## 2020-04-13 VITALS — BP 120/84 | HR 77 | Ht 65.0 in | Wt 177.0 lb

## 2020-04-13 DIAGNOSIS — I428 Other cardiomyopathies: Secondary | ICD-10-CM | POA: Diagnosis not present

## 2020-04-13 DIAGNOSIS — E1165 Type 2 diabetes mellitus with hyperglycemia: Secondary | ICD-10-CM | POA: Diagnosis not present

## 2020-04-13 DIAGNOSIS — G4733 Obstructive sleep apnea (adult) (pediatric): Secondary | ICD-10-CM

## 2020-04-13 DIAGNOSIS — Z79899 Other long term (current) drug therapy: Secondary | ICD-10-CM

## 2020-04-13 DIAGNOSIS — Z95810 Presence of automatic (implantable) cardiac defibrillator: Secondary | ICD-10-CM

## 2020-04-13 DIAGNOSIS — I10 Essential (primary) hypertension: Secondary | ICD-10-CM

## 2020-04-13 DIAGNOSIS — I5022 Chronic systolic (congestive) heart failure: Secondary | ICD-10-CM

## 2020-04-13 DIAGNOSIS — N182 Chronic kidney disease, stage 2 (mild): Secondary | ICD-10-CM | POA: Diagnosis not present

## 2020-04-13 NOTE — Patient Instructions (Addendum)
Medication Instructions:  No changes  If you need a refill on your cardiac medications before your next appointment, please call your pharmacy.    Lab work: Your physician recommends that you have lab work today: Digoxin    If you have labs (blood work) drawn today and your tests are completely normal, you will receive your results only by: Marland Kitchen MyChart Message (if you have MyChart) OR . A paper copy in the mail If you have any lab test that is abnormal or we need to change your treatment, we will call you to review the results.   Testing/Procedures: No new testing needed   Follow-Up: At [facility], you and your health needs are our priority.  As part of our continuing mission to provide you with exceptional heart care, we have created designated Provider Care Teams.  These Care Teams include your primary Cardiologist (physician) and Advanced Practice Providers (APPs -  Physician Assistants and Nurse Practitioners) who all work together to provide you with the care you need, when you need it.  . You will need a follow up appointment in 6 months  . Providers on your designated Care Team:   . Nicolasa Ducking, NP . Eula Listen, PA-C . Marisue Ivan, PA-C  Any Other Special Instructions Will Be Listed Below (If Applicable).  COVID-19 Vaccine Information can be found at: [email or web address] For questions related to vaccine distribution or appointments, please email [email or web address] or call [phone].

## 2020-04-14 ENCOUNTER — Encounter: Payer: Self-pay | Admitting: Psychiatry

## 2020-04-14 ENCOUNTER — Telehealth (INDEPENDENT_AMBULATORY_CARE_PROVIDER_SITE_OTHER): Payer: Medicare Other | Admitting: Psychiatry

## 2020-04-14 DIAGNOSIS — F3131 Bipolar disorder, current episode depressed, mild: Secondary | ICD-10-CM | POA: Insufficient documentation

## 2020-04-14 DIAGNOSIS — F1210 Cannabis abuse, uncomplicated: Secondary | ICD-10-CM

## 2020-04-14 DIAGNOSIS — F410 Panic disorder [episodic paroxysmal anxiety] without agoraphobia: Secondary | ICD-10-CM | POA: Diagnosis not present

## 2020-04-14 DIAGNOSIS — F4312 Post-traumatic stress disorder, chronic: Secondary | ICD-10-CM | POA: Diagnosis not present

## 2020-04-14 DIAGNOSIS — F5105 Insomnia due to other mental disorder: Secondary | ICD-10-CM

## 2020-04-14 DIAGNOSIS — Z91199 Patient's noncompliance with other medical treatment and regimen due to unspecified reason: Secondary | ICD-10-CM

## 2020-04-14 DIAGNOSIS — Z9119 Patient's noncompliance with other medical treatment and regimen: Secondary | ICD-10-CM

## 2020-04-14 LAB — DIGOXIN LEVEL: Digoxin, Serum: 1.1 ng/mL — ABNORMAL HIGH (ref 0.5–0.9)

## 2020-04-14 MED ORDER — ZOLPIDEM TARTRATE 5 MG PO TABS: 5.0000 mg | ORAL_TABLET | Freq: Every evening | ORAL | 1 refills | Status: DC | PRN

## 2020-04-14 MED ORDER — PAROXETINE HCL 30 MG PO TABS: 30.0000 mg | ORAL_TABLET | Freq: Every day | ORAL | 1 refills | Status: DC

## 2020-04-14 MED ORDER — LAMOTRIGINE 100 MG PO TABS: 100.0000 mg | ORAL_TABLET | Freq: Every day | ORAL | 0 refills | Status: DC

## 2020-04-14 MED ORDER — RISPERIDONE 2 MG PO TABS: 2.0000 mg | ORAL_TABLET | Freq: Every day | ORAL | 0 refills | Status: DC

## 2020-04-14 NOTE — Progress Notes (Signed)
Provider Location : [facility] Patient Location : Home  Participants: Patient , Provider  Virtual Visit via Telephone Note  I connected with Bonnie Ochoa on 04/14/20 at 10:05 AM EDT by telephone and verified that I am speaking with the correct person using two identifiers.   I discussed the limitations, risks, security and privacy concerns of performing an evaluation and management service by telephone and the availability of in person appointments. I also discussed with the patient that there may be a patient responsible charge related to this service. The patient expressed understanding and agreed to proceed.      I discussed the assessment and treatment plan with the patient. The patient was provided an opportunity to ask questions and all were answered. The patient agreed with the plan and demonstrated an understanding of the instructions.   The patient was advised to call back or seek an in-person evaluation if the symptoms worsen or if the condition fails to improve as anticipated.     BH MD OP Progress Note  04/14/2020 12:23 PM Bonnie Ochoa  MRN:  [id number]  Chief Complaint:  Chief Complaint    Follow-up     HPI: Bonnie Ochoa is a 54-year-old African-American female, engaged, lives in , has a history of PTSD, bipolar disorder, panic disorder, cannabis use disorder, insomnia, OSA on CPAP, RLS, diabetes, cirrhosis of liver, chronic renal failure, congestive heart failure, recent cataract surgery, hypertension was evaluated by phone today.  Patient was last evaluated on 07/21/2019.  Patient after that missed an appointment.  Patient today reports she is going through depressive symptoms.  She however is not sure if she is compliant on all her medications.  Patient also reports sleep issues.  She however is not sure if she is taking the Ambien as prescribed or not.  Per review of medical records, patient's medications were filled several months ago and no refills were  authorized.  This was discussed with patient.  She however continues to report she is not sure if she is taking medications as prescribed since all her medications are currently given to her by her sister who is currently not available to talk.  Patient reports recent situational stressors.  She reports her mother's property was recently sold by her brother.  She reports this does upset her and that has a lot to do with her mood problems and also sleep issues.  She is willing to start talking to her therapist again.  Patient reports she continues to cut back on cannabis.  She continues to have good support system from her partner.  Patient denies suicidality, homicidality or perceptual disturbances.  Patient denies any other concerns today.  Visit Diagnosis:    ICD-10-CM   1. Bipolar 1 disorder, depressed, mild (HCC)  F31.31 lamoTRIgine (LAMICTAL) 100 MG tablet  2. Chronic post-traumatic stress disorder (PTSD)  F43.12 PARoxetine (PAXIL) 30 MG tablet  3. Panic disorder  F41.0 PARoxetine (PAXIL) 30 MG tablet  4. Cannabis use disorder, mild, abuse  F12.10   5. Insomnia due to mental condition  F51.05 zolpidem (AMBIEN) 5 MG tablet  6. Noncompliance with treatment regimen  Z91.19 risperiDONE (RISPERDAL) 2 MG tablet    Past Psychiatric History: I have reviewed past psychiatric history from my progress note on 10/16/2017  Past Medical History:  Past Medical History:  Diagnosis Date  . Abdominal pain    a. 05/2018 HIDA scan wnl.  . ADHD   . AICD (automatic cardioverter/defibrillator) present    a. 11/2014 s/p SJM Fortify   Assura, single lead AICD (ser# [id number]).  . Anemia   . Arthritis   . Asthma   . Bipolar 1 disorder (HCC)   . Chest pain    a. Hx of cath in [place] - reportedly nl; b. 04/2018 MV: EF 22%, fixed dist ant septal, apical, and inferoapical defects - ? scar vs. attenuation. No ischemia.  . Cirrhosis of liver (HCC)   . Coronary artery disease   . Depression   . Diabetes mellitus  without complication (HCC)   . Diverticulitis   . Dysrhythmia   . Heart murmur   . HFrEF (heart failure with reduced ejection fraction) (HCC)    a. 06/2017 Echo: EF 20-25%, diff HK. Mildly dil LA; b. 05/2018 Echo: EF 25-30%, diff HK, Gr2 DD. Triv AI. Mod MR. Mildly reduced RV fxn. Mod-Sev TR. PASP 45-50mmHg.  . Hypertension   . Hypothyroidism   . IBS (irritable bowel syndrome)   . Insomnia   . Migraines   . NICM (nonischemic cardiomyopathy) (HCC)    a. EF prev 25%; b. 11/2014 s/p SJM Fortify Assura, single lead AICD (ser# [id number]); c. 06/2017 Echo: EF 20-25%; d. 05/2018 Echo: EF 25-30%, diff HK, Gr2 DD; e. 05/2018 Echo: EF 25-30%; f. 05/2018 Cath: Nl cors. LVEDP 28mmHg, PCWP 32mmHg. PA 65/40 (52). CO/CI 3.04/1.56; c. 07/2018 CPX: Mod HF limitation.  . OSA (obstructive sleep apnea)    CPAP is broken  . PTSD (post-traumatic stress disorder)   . Restless leg syndrome   . Vertigo     Past Surgical History:  Procedure Laterality Date  . ABDOMINAL HYSTERECTOMY    . CARDIAC CATHETERIZATION    . CATARACT EXTRACTION W/PHACO Right 01/27/2019   Procedure: CATARACT EXTRACTION PHACO AND INTRAOCULAR LENS PLACEMENT (IOC)  VISION BLUE RIGHT DIABETES;  Surgeon: Porfilio, William, MD;  Location: [facility];  Service: Ophthalmology;  Laterality: Right;  Diabetes - insulin sleep apnea  . CATARACT EXTRACTION W/PHACO Left 03/08/2020   Procedure: CATARACT EXTRACTION PHACO AND INTRAOCULAR LENS PLACEMENT (IOC) LEFT DIABETIC 10.28  00:57.8;  Surgeon: Porfilio, William, MD;  Location: [facility];  Service: Ophthalmology;  Laterality: Left;  . COLONOSCOPY WITH PROPOFOL N/A 03/11/2019   Procedure: COLONOSCOPY WITH PROPOFOL;  Surgeon: Tahiliani, Varnita B, MD;  Location: [facility];  Service: Endoscopy;  Laterality: N/A;  . CORONARY ARTERY BYPASS GRAFT     Pt denies  . ESOPHAGOGASTRODUODENOSCOPY (EGD) WITH PROPOFOL N/A 03/11/2019   Procedure: ESOPHAGOGASTRODUODENOSCOPY (EGD) WITH PROPOFOL;   Surgeon: Tahiliani, Varnita B, MD;  Location: [facility];  Service: Endoscopy;  Laterality: N/A;  . INSERT / REPLACE / REMOVE PACEMAKER     ICD  . INSERTION OF ICD  2016   St Jude Single chamber ICD  . RIGHT/LEFT HEART CATH AND CORONARY ANGIOGRAPHY N/A 06/09/2018   Procedure: RIGHT/LEFT HEART CATH AND CORONARY ANGIOGRAPHY;  Surgeon: Arida, Muhammad A, MD;  Location: [facility];  Service: Cardiovascular;  Laterality: N/A;  . TONSILLECTOMY    . TUBAL LIGATION  1980    Family Psychiatric History: I have reviewed family psychiatric history from my progress note on 10/16/2017  Family History:  Family History  Problem Relation Age of Onset  . Hypertension Mother   . Hyperlipidemia Mother   . Heart disease Mother   . Hypertension Sister   . Asthma Sister   . Heart disease Sister   . Diabetes Sister   . Cancer Sister   . Alzheimer's disease Maternal Grandfather   . Hyperlipidemia Brother   . Asthma   Sister   . Hypertension Sister   . Diabetes Sister     Social History: I have reviewed social history from my progress note on 10/16/2017 Social History   Socioeconomic History  . Marital status: Married    Spouse name: Not on file  . Number of children: 2  . Years of education: Not on file  . Highest education level: High school graduate  Occupational History    Comment: disabled  Tobacco Use  . Smoking status: Former Smoker    Types: Cigarettes    Quit date: 1985    Years since quitting: 36.6  . Smokeless tobacco: Never Used  . Tobacco comment: quit over  20 years ago   Vaping Use  . Vaping Use: Never used  Substance and Sexual Activity  . Alcohol use: Not Currently  . Drug use: Yes    Frequency: 1.0 times per week    Types: Marijuana    Comment: occassionally  . Sexual activity: Yes    Birth control/protection: Surgical    Comment: one partner  Other Topics Concern  . Not on file  Social History Narrative   Volunteers occasionally    Social Determinants  of Health   Financial Resource Strain:   . Difficulty of Paying Living Expenses: Not on file  Food Insecurity:   . Worried About Charity fundraiser in the Last Year: Not on file  . Ran Out of Food in the Last Year: Not on file  Transportation Needs:   . Lack of Transportation (Medical): Not on file  . Lack of Transportation (Non-Medical): Not on file  Physical Activity:   . Days of Exercise per Week: Not on file  . Minutes of Exercise per Session: Not on file  Stress:   . Feeling of Stress : Not on file  Social Connections:   . Frequency of Communication with Friends and Family: Not on file  . Frequency of Social Gatherings with Friends and Family: Not on file  . Attends Religious Services: Not on file  . Active Member of Clubs or Organizations: Not on file  . Attends Archivist Meetings: Not on file  . Marital Status: Not on file    Allergies:  Allergies  Allergen Reactions  . Levothyroxine Rash    Metabolic Disorder Labs: Lab Results  Component Value Date   HGBA1C 8.5 (H) 04/06/2020   MPG 185.77 05/08/2018   MPG >398 07/03/2017   No results found for: PROLACTIN Lab Results  Component Value Date   CHOL 214 (H) 09/28/2019   TRIG 321 (H) 09/28/2019   HDL 36 (L) 09/28/2019   CHOLHDL 5.4 05/09/2018   VLDL 53 (H) 05/09/2018   LDLCALC 121 (H) 09/28/2019   [place] Comment 11/03/2018   Lab Results  Component Value Date   TSH 1.290 03/02/2020   TSH 1.420 09/28/2019    Therapeutic Level Labs: No results found for: LITHIUM No results found for: VALPROATE No components found for:  CBMZ  Current Medications: Current Outpatient Medications  Medication Sig Dispense Refill  . blood glucose meter kit and supplies KIT Dispense based on patient and insurance preference. Use up to four times daily as directed. (FOR ICD-9 250.00, 250.01). 1 each 0  . carvedilol (COREG) 12.5 MG tablet Take 12.5 mg by mouth 2 (two) times daily with a meal.    . cyclobenzaprine  (FLEXERIL) 5 MG tablet TAKE 1 TABLET BY MOUTH THREE TIMES A DAY AS NEEDED FOR MUSCLE SPASMS 60 tablet 1  .  digoxin (LANOXIN) 0.125 MG tablet Take 0.5 tablets (0.0625 mg total) by mouth daily. 90 tablet 3  . Dulaglutide (TRULICITY) 4.5 MG/0.5ML SOPN Inject 0.5 mLs (4.5 mg total) as directed once a week. 2 mL 3  . DUREZOL 0.05 % EMUL Apply 1 drop to eye daily.     . empagliflozin (JARDIANCE) 25 MG TABS tablet Take 1 tablet (25 mg total) by mouth daily. 90 tablet 1  . famotidine (PEPCID) 40 MG tablet TAKE 1 TABLET BY MOUTH EVERY DAY IN THE EVENING 90 tablet 3  . furosemide (LASIX) 20 MG tablet Take 1 tablet (20 mg total) by mouth daily. 90 tablet 0  . ILEVRO 0.3 % ophthalmic suspension Place 1 drop into the left eye daily.     . insulin glargine (LANTUS SOLOSTAR) 100 UNIT/ML Solostar Pen Inject 35 Units into the skin daily at 10 pm. 15 mL 3  . Insulin Pen Needle 32G X 6 MM MISC 1 each by Does not apply route daily. 100 each 12  . KLOR-CON M20 20 MEQ tablet TAKE 2 TABLETS (40 MEQ TOTAL) BY MOUTH 2 (TWO) TIMES DAILY. 360 tablet 2  . lamoTRIgine (LAMICTAL) 100 MG tablet Take 1 tablet (100 mg total) by mouth daily. 90 tablet 0  . metoCLOPramide (REGLAN) 10 MG tablet TAKE 1 TABLET (10 MG TOTAL) BY MOUTH 3 (THREE) TIMES DAILY WITH MEALS. 90 tablet 1  . pantoprazole (PROTONIX) 40 MG tablet TAKE 1 TABLET BY MOUTH EVERY DAY 90 tablet 1  . PARoxetine (PAXIL) 30 MG tablet Take 1 tablet (30 mg total) by mouth daily. 90 tablet 1  . risperiDONE (RISPERDAL) 2 MG tablet Take 1 tablet (2 mg total) by mouth at bedtime. 90 tablet 0  . rosuvastatin (CRESTOR) 40 MG tablet TAKE 1 TABLET BY MOUTH EVERY DAY 90 tablet 1  . sacubitril-valsartan (ENTRESTO) 24-26 MG Take 1 tablet by mouth 2 (two) times daily. 60 tablet 3  . spironolactone (ALDACTONE) 25 MG tablet Take 25 mg by mouth daily.    . sucralfate (CARAFATE) 1 g tablet TAKE 1 TABLET (1 G TOTAL) BY MOUTH 4 (FOUR) TIMES DAILY. 360 tablet 3  . Vitamin D, Ergocalciferol,  (DRISDOL) 1.25 MG (50000 UNIT) CAPS capsule Take 1 capsule (50,000 Units total) by mouth every 7 (seven) days. 12 capsule 0  . zolpidem (AMBIEN) 5 MG tablet Take 1 tablet (5 mg total) by mouth at bedtime as needed for sleep. 30 tablet 1   No current facility-administered medications for this visit.     Musculoskeletal: Strength & Muscle Tone: UTA Gait & Station: UTA Patient leans: N/A  Psychiatric Specialty Exam: Review of Systems  Psychiatric/Behavioral: Positive for dysphoric mood and sleep disturbance.  All other systems reviewed and are negative.   There were no vitals taken for this visit.There is no height or weight on file to calculate BMI.  General Appearance: UTA  Eye Contact:  UTA  Speech:  Clear and Coherent  Volume:  Normal  Mood:  Dysphoric  Affect:  UTA  Thought Process:  Goal Directed and Descriptions of Associations: Intact  Orientation:  Full (Time, Place, and Person)  Thought Content: Logical   Suicidal Thoughts:  No  Homicidal Thoughts:  No  Memory:  Immediate;   Fair Recent;   Fair Remote;   Fair  Judgement:  Fair  Insight:  Fair  Psychomotor Activity:  UTA  Concentration:  Concentration: Fair and Attention Span: Fair  Recall:  Fair  Fund of Knowledge: Fair  Language: Fair    Akathisia:  No  Handed:  Right  AIMS (if indicated): UTA  Assets:  Communication Skills Desire for Improvement Housing Social Support  ADL's:  Intact  Cognition: WNL  Sleep:  Poor   Screenings: GAD-7     Office Visit from 12/28/2016 in [place]  Total GAD-7 Score 21    PHQ2-9     Office Visit from 09/28/2019 in [place] from 05/18/2019 in [facility] Patient Outreach Telephone from 06/16/2018 in [place] Visit from 05/08/2018 in [place] Office Visit from 04/23/2018 in [place]  PHQ-2 Total Score 0 1 0 1  0  PHQ-9 Total Score 1 -- -- -- --       Assessment and Plan: Bonnie Ochoa  Is a 54 year old African-American female who has a history of bipolar disorder, PTSD, panic attacks, cannabis use disorder, congestive heart failure, liver disease, chronic renal failure, diabetes melitis, OSA, hypertension, RLS was evaluated by phone today.  Patient is currently struggling with situational stressors, sleep problems and depressive symptoms.  Patient likely with current noncompliance with medications as well as follow-up appointments.  Discussed plan as noted below.  Plan Bipolar disorder-depressed-unstable Risperidone 2 mg p.o. nightly Increase lamotrigine to 100 mg p.o. daily in divided dosage. Restart Paxil 30 mg p.o. daily-patient likely noncompliant.  PTSD-stable Paxil as prescribed  Panic disorder-stable Paxil as prescribed  Insomnia-unstable Restart Ambien 5 mg p.o. nightly as needed Patient to continue to work on sleep hygiene techniques. Continue CPAP for OSA  Cannabis use disorder-improving Provided counseling.  She is currently cutting back.  I have sent a referral for patient to start CBT.  Noncompliance with medications and treatment plan-encouraged compliance, provided education.  Follow-up in clinic in 4 weeks or sooner if needed.  I have spent atleast 20 minutes non face to face with patient today. More than 50 % of the time was spent for preparing to see the patient ( e.g., review of test, records ), ordering medications and test ,psychoeducation and supportive psychotherapy and care coordination,as well as documenting clinical information in electronic health record. This note was generated in part or whole with voice recognition software. Voice recognition is usually quite accurate but there are transcription errors that can and very often do occur. I apologize for any typographical errors that were not detected and corrected.     [name], MD 04/14/2020, 12:23  PM

## 2020-04-22 ENCOUNTER — Other Ambulatory Visit: Payer: Self-pay | Admitting: Gastroenterology

## 2020-04-25 ENCOUNTER — Telehealth: Payer: Self-pay

## 2020-04-25 NOTE — Telephone Encounter (Signed)
-----   Message from Antonieta Iba, MD sent at 04/24/2020  3:01 PM EDT ----- Digoxin level Mildly elevated Would recommend she hold the digoxin 2 days a week Continue the other 5 days

## 2020-04-25 NOTE — Telephone Encounter (Signed)
Attempted to call patient. LMTCB 04/25/2020 ° ° °

## 2020-04-26 NOTE — Telephone Encounter (Signed)
Attempted to call patient. LMTCB 04/26/2020   

## 2020-04-27 MED ORDER — DIGOXIN 125 MCG PO TABS
ORAL_TABLET | ORAL | 3 refills | Status: DC
Start: 2020-04-27 — End: 2020-07-11

## 2020-05-06 ENCOUNTER — Other Ambulatory Visit: Payer: Self-pay | Admitting: Physician Assistant

## 2020-05-09 ENCOUNTER — Encounter: Payer: Self-pay | Admitting: Family Medicine

## 2020-05-09 ENCOUNTER — Ambulatory Visit (INDEPENDENT_AMBULATORY_CARE_PROVIDER_SITE_OTHER): Payer: Medicare Other | Admitting: Family Medicine

## 2020-05-09 ENCOUNTER — Other Ambulatory Visit: Payer: Self-pay

## 2020-05-09 VITALS — BP 120/82 | HR 89 | Temp 99.0°F | Wt 177.0 lb

## 2020-05-09 DIAGNOSIS — Z794 Long term (current) use of insulin: Secondary | ICD-10-CM | POA: Diagnosis not present

## 2020-05-09 DIAGNOSIS — N1831 Chronic kidney disease, stage 3a: Secondary | ICD-10-CM

## 2020-05-09 DIAGNOSIS — E1121 Type 2 diabetes mellitus with diabetic nephropathy: Secondary | ICD-10-CM | POA: Diagnosis not present

## 2020-05-09 DIAGNOSIS — Z1231 Encounter for screening mammogram for malignant neoplasm of breast: Secondary | ICD-10-CM

## 2020-05-09 DIAGNOSIS — Z23 Encounter for immunization: Secondary | ICD-10-CM | POA: Diagnosis not present

## 2020-05-09 DIAGNOSIS — E1122 Type 2 diabetes mellitus with diabetic chronic kidney disease: Secondary | ICD-10-CM

## 2020-05-09 MED ORDER — TRULICITY 4.5 MG/0.5ML ~~LOC~~ SOAJ: 4.5000 mg | SUBCUTANEOUS | 1 refills | Status: DC

## 2020-05-09 NOTE — Patient Instructions (Signed)
Please call at this number ([facility]) to schedule your mammogram. [phone] 

## 2020-05-09 NOTE — Assessment & Plan Note (Signed)
Tolerating her meds well. Will check BMP today. Await results. Recheck A1c 2 months.

## 2020-05-09 NOTE — Progress Notes (Signed)
BP 120/82   Pulse 89   Temp 99 F (37.2 C) (Oral)   Wt 177 lb (80.3 kg)   SpO2 98%   BMI 29.45 kg/m    Subjective:    Patient ID: Bonnie Ochoa, female    DOB: 18-Jun-1966, 54 y.o.   MRN: [id number]  HPI: Kristl Morioka is a 54 y.o. female  Chief Complaint  Patient presents with  . Diabetes   DIABETES Hypoglycemic episodes:no Polydipsia/polyuria: no Visual disturbance: no Chest pain: no Paresthesias: no Glucose Monitoring: yes  Accucheck frequency: occasionally  Fasting glucose: 190 Taking Insulin?: yes Blood Pressure Monitoring: not checking Retinal Examination: Up to Date Foot Exam: Up to Date Diabetic Education: Completed Pneumovax: Up to Date Influenza: Up to Date Aspirin: yes   Relevant past medical, surgical, family and social history reviewed and updated as indicated. Interim medical history since our last visit reviewed. Allergies and medications reviewed and updated.  Review of Systems  Constitutional: Negative.   Respiratory: Negative.   Cardiovascular: Negative.   Gastrointestinal: Negative.   Musculoskeletal: Negative.   Psychiatric/Behavioral: Negative.     Per HPI unless specifically indicated above     Objective:    BP 120/82   Pulse 89   Temp 99 F (37.2 C) (Oral)   Wt 177 lb (80.3 kg)   SpO2 98%   BMI 29.45 kg/m   Wt Readings from Last 3 Encounters:  05/09/20 177 lb (80.3 kg)  04/13/20 177 lb (80.3 kg)  04/06/20 178 lb 9.6 oz (81 kg)    Physical Exam Vitals and nursing note reviewed.  Constitutional:      General: She is not in acute distress.    Appearance: Normal appearance. She is not ill-appearing, toxic-appearing or diaphoretic.  HENT:     Head: Normocephalic and atraumatic.     Right Ear: External ear normal.     Left Ear: External ear normal.     Nose: Nose normal.     Mouth/Throat:     Mouth: Mucous membranes are moist.     Pharynx: Oropharynx is clear.  Eyes:     General: No scleral icterus.       Right eye:  No discharge.        Left eye: No discharge.     Extraocular Movements: Extraocular movements intact.     Conjunctiva/sclera: Conjunctivae normal.     Pupils: Pupils are equal, round, and reactive to light.  Cardiovascular:     Rate and Rhythm: Normal rate and regular rhythm.     Pulses: Normal pulses.     Heart sounds: Normal heart sounds. No murmur heard.  No friction rub. No gallop.   Pulmonary:     Effort: Pulmonary effort is normal. No respiratory distress.     Breath sounds: Normal breath sounds. No stridor. No wheezing, rhonchi or rales.  Chest:     Chest wall: No tenderness.  Musculoskeletal:        General: Normal range of motion.     Cervical back: Normal range of motion and neck supple.  Skin:    General: Skin is warm and dry.     Capillary Refill: Capillary refill takes less than 2 seconds.     Coloration: Skin is not jaundiced or pale.     Findings: No bruising, erythema, lesion or rash.  Neurological:     General: No focal deficit present.     Mental Status: She is alert and oriented to person, place, and time. Mental status is  at baseline.  Psychiatric:        Mood and Affect: Mood normal.        Behavior: Behavior normal.        Thought Content: Thought content normal.        Judgment: Judgment normal.     Results for orders placed or performed in visit on 04/13/20  Digoxin level  Result Value Ref Range   Digoxin, Serum 1.1 (H) 0.5 - 0.9 ng/mL      Assessment & Plan:   Problem List Items Addressed This Visit      Endocrine   DM (diabetes mellitus), type 2 with renal complications (HCC) - Primary    Tolerating her meds well. Will check BMP today. Await results. Recheck A1c 2 months.       Relevant Medications   Dulaglutide (TRULICITY) 4.5 MG/0.5ML SOPN   Other Relevant Orders   Basic metabolic panel    Other Visit Diagnoses    Flu vaccine need       Flu shot given today.   Relevant Orders   Flu Vaccine QUAD 36+ mos IM   Encounter for screening  mammogram for malignant neoplasm of breast       Mammogram ordered today.   Relevant Orders   MM DIGITAL SCREENING BILATERAL       Follow up plan: Return in about 2 months (around 07/09/2020).

## 2020-05-10 LAB — BASIC METABOLIC PANEL
BUN/Creatinine Ratio: 13 (ref 9–23)
BUN: 15 mg/dL (ref 6–24)
CO2: 23 mmol/L (ref 20–29)
Calcium: 10 mg/dL (ref 8.7–10.2)
Chloride: 102 mmol/L (ref 96–106)
Creatinine, Ser: 1.13 mg/dL — ABNORMAL HIGH (ref 0.57–1.00)
GFR calc Af Amer: 63 mL/min/{1.73_m2} (ref >59)
GFR calc non Af Amer: 54 mL/min/{1.73_m2} — ABNORMAL LOW (ref >59)
Glucose: 223 mg/dL — ABNORMAL HIGH (ref 65–99)
Potassium: 3.7 mmol/L (ref 3.5–5.2)
Sodium: 139 mmol/L (ref 134–144)

## 2020-05-17 ENCOUNTER — Telehealth: Payer: Self-pay | Admitting: Family Medicine

## 2020-05-17 NOTE — Telephone Encounter (Signed)
Copied from CRM [id number]. Topic: Medicare AWV >> May 17, 2020 11:23 AM Claudette Laws R wrote: Reason for CRM:  Left message for patient to call back and schedule the Medicare Annual Wellness Visit (AWV) virtually.  Last AWV 05/18/2019  Please schedule at anytime with [facility]Nurse Health Advisor.  45 minute appointment  Any questions, please call me at [phone]

## 2020-05-26 ENCOUNTER — Other Ambulatory Visit: Payer: Self-pay

## 2020-05-26 ENCOUNTER — Telehealth (INDEPENDENT_AMBULATORY_CARE_PROVIDER_SITE_OTHER): Payer: Medicare Other | Admitting: Psychiatry

## 2020-05-26 DIAGNOSIS — F3131 Bipolar disorder, current episode depressed, mild: Secondary | ICD-10-CM

## 2020-05-26 NOTE — Progress Notes (Signed)
Patient attempted to connect however less than 50% into the appointment patient had phone trouble.  We will reschedule this appointment.

## 2020-06-04 ENCOUNTER — Other Ambulatory Visit: Payer: Self-pay | Admitting: Family Medicine

## 2020-06-04 NOTE — Telephone Encounter (Signed)
Requested medications are due for refill today yes  Requested medications are on the active medication list yes  Last refill 7/25  Last visit 02/2020  Notes to clinic Not Delegated

## 2020-06-05 ENCOUNTER — Other Ambulatory Visit: Payer: Self-pay | Admitting: Physician Assistant

## 2020-06-07 ENCOUNTER — Ambulatory Visit: Payer: Medicare Other | Admitting: Licensed Clinical Social Worker

## 2020-06-27 ENCOUNTER — Other Ambulatory Visit: Payer: Self-pay | Admitting: Family Medicine

## 2020-06-27 NOTE — Telephone Encounter (Signed)
Requested medication (s) are due for refill today: Yes  Requested medication (s) are on the active medication list: Yes  Last refill: 06/06/20  Future visit scheduled: Yes  Notes to clinic: Unable to refill per protocol, cannot delegate     Requested Prescriptions  Pending Prescriptions Disp Refills   Vitamin D, Ergocalciferol, (DRISDOL) 1.25 MG (50000 UNIT) CAPS capsule [Pharmacy Med Name: VITAMIN D2 1.25MG (50,000 UNIT)] 4 capsule 0    Sig: TAKE 1 CAPSULE (50,000 UNITS TOTAL) BY MOUTH EVERY 7 (SEVEN) DAYS.      Endocrinology:  Vitamins - Vitamin D Supplementation Failed - 06/27/2020  1:27 AM      Failed - 50,000 IU strengths are not delegated      Failed - Phosphate in normal range and within 360 days    Phosphorus  Date Value Ref Range Status  05/27/2018 3.0 2.5 - 4.6 mg/dL Final    Comment:    Performed at [facility], [address] [place], [place] [address]          Failed - Vitamin D in normal range and within 360 days    Vit D, 25-Hydroxy  Date Value Ref Range Status  03/02/2020 11.6 (L) 30.0 - 100.0 ng/mL Final    Comment:    Vitamin D deficiency has been defined by the Institute of Medicine and an Endocrine Society practice guideline as a level of serum 25-OH vitamin D less than 20 ng/mL (1,2). The Endocrine Society went on to further define vitamin D insufficiency as a level between 21 and 29 ng/mL (2). 1. IOM (Institute of Medicine). 2010. Dietary reference    intakes for calcium and D. [place]: The    [employer]. 2. Holick MF, Binkley [place], Bischoff-Ferrari HA, et al.    Evaluation, treatment, and prevention of vitamin D    deficiency: an Endocrine Society clinical practice    guideline. JCEM. 2011 Jul; 96(7):1911-30.           Passed - Ca in normal range and within 360 days    Calcium  Date Value Ref Range Status  05/09/2020 10.0 8.7 - 10.2 mg/dL Final          Passed - Valid encounter within last 12 months     Recent Outpatient Visits           1 month ago Type 2 diabetes mellitus with stage 3a chronic kidney disease, with long-term current use of insulin (HCC)   [facility] [place], Megan P, DO   2 months ago Type 2 diabetes mellitus with stage 3a chronic kidney disease, with long-term current use of insulin (HCC)   [facility] [place], Megan P, DO   3 months ago Procedure not carried out   [facility], Megan P, DO   3 months ago Type 2 diabetes mellitus with stage 3a chronic kidney disease, with long-term current use of insulin (HCC)   [facility] [place], Megan P, DO   4 months ago Type 2 diabetes mellitus with stage 3a chronic kidney disease, with long-term current use of insulin (HCC)   [facility] [place], [place], DO       Future Appointments             In 2 weeks Laural Benes, Oralia Rud, DO [employer] [facility], [facility]   In 3 months Gollan, Tollie Pizza, MD [facility] [employer], [facility]

## 2020-06-28 ENCOUNTER — Other Ambulatory Visit: Payer: Self-pay | Admitting: Family Medicine

## 2020-06-28 DIAGNOSIS — Z1231 Encounter for screening mammogram for malignant neoplasm of breast: Secondary | ICD-10-CM

## 2020-06-29 ENCOUNTER — Other Ambulatory Visit: Payer: Self-pay | Admitting: Psychiatry

## 2020-06-29 ENCOUNTER — Encounter (HOSPITAL_COMMUNITY): Payer: Self-pay | Admitting: Internal Medicine

## 2020-06-29 ENCOUNTER — Other Ambulatory Visit: Payer: Self-pay

## 2020-06-29 ENCOUNTER — Ambulatory Visit (HOSPITAL_COMMUNITY)
Admission: RE | Admit: 2020-06-29 | Discharge: 2020-06-29 | Disposition: A | Discharge status: Home / Self Care | Payer: Medicare Other | Source: Ambulatory Visit | Attending: Internal Medicine | Admitting: Internal Medicine

## 2020-06-29 ENCOUNTER — Other Ambulatory Visit: Payer: Self-pay | Admitting: Family Medicine

## 2020-06-29 VITALS — BP 140/80 | HR 80 | Wt 180.8 lb

## 2020-06-29 DIAGNOSIS — Z79899 Other long term (current) drug therapy: Secondary | ICD-10-CM | POA: Diagnosis not present

## 2020-06-29 DIAGNOSIS — I10 Essential (primary) hypertension: Secondary | ICD-10-CM

## 2020-06-29 DIAGNOSIS — F319 Bipolar disorder, unspecified: Secondary | ICD-10-CM | POA: Diagnosis not present

## 2020-06-29 DIAGNOSIS — Z9119 Patient's noncompliance with other medical treatment and regimen: Secondary | ICD-10-CM

## 2020-06-29 DIAGNOSIS — G4733 Obstructive sleep apnea (adult) (pediatric): Secondary | ICD-10-CM | POA: Diagnosis not present

## 2020-06-29 DIAGNOSIS — I130 Hypertensive heart and chronic kidney disease with heart failure and stage 1 through stage 4 chronic kidney disease, or unspecified chronic kidney disease: Secondary | ICD-10-CM | POA: Insufficient documentation

## 2020-06-29 DIAGNOSIS — N182 Chronic kidney disease, stage 2 (mild): Secondary | ICD-10-CM | POA: Diagnosis not present

## 2020-06-29 DIAGNOSIS — I428 Other cardiomyopathies: Secondary | ICD-10-CM | POA: Insufficient documentation

## 2020-06-29 DIAGNOSIS — E1122 Type 2 diabetes mellitus with diabetic chronic kidney disease: Secondary | ICD-10-CM | POA: Insufficient documentation

## 2020-06-29 DIAGNOSIS — Z8249 Family history of ischemic heart disease and other diseases of the circulatory system: Secondary | ICD-10-CM | POA: Insufficient documentation

## 2020-06-29 DIAGNOSIS — I5042 Chronic combined systolic (congestive) and diastolic (congestive) heart failure: Secondary | ICD-10-CM | POA: Diagnosis not present

## 2020-06-29 DIAGNOSIS — E876 Hypokalemia: Secondary | ICD-10-CM | POA: Insufficient documentation

## 2020-06-29 DIAGNOSIS — Z794 Long term (current) use of insulin: Secondary | ICD-10-CM | POA: Diagnosis not present

## 2020-06-29 DIAGNOSIS — Z833 Family history of diabetes mellitus: Secondary | ICD-10-CM | POA: Insufficient documentation

## 2020-06-29 DIAGNOSIS — I5022 Chronic systolic (congestive) heart failure: Secondary | ICD-10-CM | POA: Diagnosis not present

## 2020-06-29 DIAGNOSIS — Z95810 Presence of automatic (implantable) cardiac defibrillator: Secondary | ICD-10-CM

## 2020-06-29 DIAGNOSIS — Z6830 Body mass index (BMI) 30.0-30.9, adult: Secondary | ICD-10-CM | POA: Insufficient documentation

## 2020-06-29 DIAGNOSIS — K7460 Unspecified cirrhosis of liver: Secondary | ICD-10-CM | POA: Diagnosis not present

## 2020-06-29 DIAGNOSIS — Z91199 Patient's noncompliance with other medical treatment and regimen due to unspecified reason: Secondary | ICD-10-CM

## 2020-06-29 DIAGNOSIS — F3131 Bipolar disorder, current episode depressed, mild: Secondary | ICD-10-CM

## 2020-06-29 DIAGNOSIS — G2581 Restless legs syndrome: Secondary | ICD-10-CM | POA: Diagnosis not present

## 2020-06-29 DIAGNOSIS — Z87891 Personal history of nicotine dependence: Secondary | ICD-10-CM | POA: Diagnosis not present

## 2020-06-29 LAB — BASIC METABOLIC PANEL
Anion gap: 8 (ref 5–15)
BUN: 14 mg/dL (ref 6–20)
CO2: 27 mmol/L (ref 22–32)
Calcium: 10.1 mg/dL (ref 8.9–10.3)
Chloride: 103 mmol/L (ref 98–111)
Creatinine, Ser: 1.1 mg/dL — ABNORMAL HIGH (ref 0.44–1.00)
GFR, Estimated: 59 mL/min — ABNORMAL LOW (ref >60)
Glucose, Bld: 173 mg/dL — ABNORMAL HIGH (ref 70–99)
Potassium: 4.1 mmol/L (ref 3.5–5.1)
Sodium: 138 mmol/L (ref 135–145)

## 2020-06-29 LAB — DIGOXIN LEVEL: Digoxin Level: 1 ng/mL (ref 1.0–2.0)

## 2020-06-29 MED ORDER — FUROSEMIDE 20 MG PO TABS
20.0000 mg | ORAL_TABLET | ORAL | 2 refills | Status: DC
Start: 2020-06-29 — End: 2020-10-11

## 2020-06-29 MED ORDER — ENTRESTO 49-51 MG PO TABS: 1.0000 | ORAL_TABLET | Freq: Two times a day (BID) | ORAL | 3 refills | Status: DC

## 2020-06-29 NOTE — Patient Instructions (Signed)
INCREASE Entresto 49/51mg  (1 tablet) twice daily  DECREASE Lasix 20mg  (1 tablet) every Monday, Wednesday, Friday  Labs done today, your results will be available in MyChart, we will contact you for abnormal readings.  You have been referred to Cardiac Rehab. They will contact you to schedule.  Your physician has requested that you have an echocardiogram. Echocardiography is a painless test that uses sound waves to create images of your heart. It provides your doctor with information about the size and shape of your heart and how well your heart's chambers and valves are working. This procedure takes approximately one hour. There are no restrictions for this procedure.  Your physician recommends that you schedule a follow-up appointment in: 3 months.  If you have any questions or concerns before your next appointment please send Wednesday a message through [facility] or call our office at [phone].    TO LEAVE A MESSAGE FOR THE NURSE SELECT OPTION 2, PLEASE LEAVE A MESSAGE INCLUDING: . YOUR NAME . DATE OF BIRTH . CALL BACK NUMBER . REASON FOR CALL**this is important as we prioritize the call backs  YOU WILL RECEIVE A CALL BACK THE SAME DAY AS LONG AS YOU CALL BEFORE 4:00 PM

## 2020-06-29 NOTE — Progress Notes (Signed)
ADVANCED HF CLINIC NOTE  Referring Provider: Carmela Rima NP  Primary Care: Dr. Rosita Kea Primary Cardiologist: Dr. Rockey Situ  Nephrology: Dr. Billey Chang HF MD: Dr. Haroldine Laws  HPI: Bonnie Ochoa was being referred to the [facility] at the request of [name] NP for heart failure consultation.   Bonnie Ochoa is a 54 year old with a history of NICM, chronic systolic heart failure,St Jude single chamber ICD,  DMII, HTN, bipolar disorder, OSA, PTSD, depression, morbid obesity, former heavy smoker, and cirrhosis. Initially diagnosed with HF in 2008 while living in [place]. In 2017 she relocated to [place] to be closer to her sister.   Ad[facility] to [facility] June 07, 2018 with chest pain and shortness of breath. Diuresed with IV lasix and later underwent RHC/LHC. She has elevated filling pressures and lowe cardiac index was 1.5. Normal coronaries   Echo 7/21 EF 20-25% RV ok.   Today she returns for HF follow up. She is here with her wife. Since last visit has been walking 10-15 minutes 4 days/week. No SOB, some CP resolves with stopping. PCP added Jardiance last month. No dizziness. Not wearing CPAP anymore, it broke. Weighs occasionally but is eating more, trying to limit salt and fried foods. Home weights average 170's.  No edema, orthopnea or PND. Struggles with restless legs and tremor.   CorVue 11/21: ERI~5.1 years, no VT/VF, impedence trending back up but has not crossed threshold (personally reviewed).  CPX 07/16/2018  Peak VO2: 11.8 (66% predicted peak VO2) VE/VCO2 slope: 37 OUES: 1.12 Peak RER: 1.09 Moderate limitation with moderately elevated VE/VCO2.   Cardiac Test 05/2018 RHC/LHC   Normal coronary arteries. PA 65/40 Mean 52  PCWP 32 PVR 6.6 CO 3.04 CI 1.56.   ECHO 06/08/2018  Left ventricle: The cavity size was severely dilated. Wall   thickness was at the upper limits of normal. Systolic function   was severely reduced. The estimated ejection fraction was in the    range of 25% to 30%. Diffuse hypokinesis. Features are consistent   with a pseudonormal left ventricular filling pattern, with   concomitant abnormal relaxation and increased filling pressure   (grade 2 diastolic dysfunction). Doppler parameters are   consistent with high ventricular filling pressure. - Aortic valve: Trileaflet; mildly thickened, mildly calcified   leaflets. There was trivial regurgitation. - Mitral valve: There was at least moderate regurgitation. - Left atrium: The atrium was mildly dilated. - Right ventricle: The cavity size was mildly dilated. Wall   thickness was normal. Systolic function was mildly reduced. - Right atrium: The atrium was mildly dilated. - Tricuspid valve: There was moderate-severe regurgitation. - Pulmonary arteries: Systolic pressure was moderately increased,   in the range of 45 mm Hg to 50 mm Hg. - Inferior vena cava: The vessel was dilated. The respirophasic   diameter changes were blunted (< 50%), consistent with elevated   central venous pressure. - Pericardium, extracardiac: A small pericardial effusion was   identified.    Past Medical History:  Diagnosis Date  . Abdominal pain    a. 05/2018 HIDA scan wnl.  . ADHD   . AICD (automatic cardioverter/defibrillator) present    a. 11/2014 s/p SJM Fortify Assura, single lead AICD (ser# [id number]).  Marland Kitchen Anemia   . Arthritis   . Asthma   . Bipolar 1 disorder ([place])   . Chest pain    a. Hx of cath in [place] - reportedly nl; b. 04/2018 MV: EF 22%, fixed dist ant septal, apical,  and inferoapical defects - ? scar vs. attenuation. No ischemia.  . CHF (congestive heart failure) ([place])   . Cirrhosis of liver ([place])   . Coronary artery disease   . Depression   . Diabetes mellitus without complication ([place])   . Diverticulitis   . Dysrhythmia   . Heart murmur   . HFrEF (heart failure with reduced ejection fraction) ([place])    a. 06/2017 Echo: EF 20-25%, diff HK. Mildly dil LA; b. 05/2018 Echo: EF 25-30%,  diff HK, Gr2 DD. Triv AI. Mod MR. Mildly reduced RV fxn. Mod-Sev TR. PASP 45-55mHg.  .Marland KitchenHypertension   . Hypothyroidism   . IBS (irritable bowel syndrome)   . Insomnia   . Migraines   . NICM (nonischemic cardiomyopathy) ([place]    a. EF prev 25%; b. 11/2014 s/p SJM Fortify Assura, single lead AICD (ser# [id number]; c. 06/2017 Echo: EF 20-25%; d. 05/2018 Echo: EF 25-30%, diff HK, Gr2 DD; e. 05/2018 Echo: EF 25-30%; f. 05/2018 Cath: Nl cors. LVEDP 27mg, PCWP 3243m. PA 65/40 (52). CO/CI 3.04/1.56; c. 07/2018 CPX: Mod HF limitation.  . OSA (obstructive sleep apnea)    CPAP is broken  . PTSD (post-traumatic stress disorder)   . Restless leg syndrome   . Vertigo     Current Outpatient Medications  Medication Sig Dispense Refill  . blood glucose meter kit and supplies KIT Dispense based on patient and insurance preference. Use up to four times daily as directed. (FOR ICD-9 250.00, 250.01). 1 each 0  . carvedilol (COREG) 12.5 MG tablet Take 12.5 mg by mouth 2 (two) times daily with a meal.    . cyclobenzaprine (FLEXERIL) 5 MG tablet TAKE 1 TABLET BY MOUTH THREE TIMES A DAY AS NEEDED FOR MUSCLE SPASMS 60 tablet 1  . digoxin (LANOXIN) 0.125 MG tablet On Monday through Friday only, hold on weekends 90 tablet 3  . Dulaglutide (TRULICITY) 4.5 MG/YI/5.0YDPN Inject 4.5 mg as directed once a week. 6 mL 1  . empagliflozin (JARDIANCE) 25 MG TABS tablet Take 1 tablet (25 mg total) by mouth daily. 90 tablet 1  . ENTRESTO 24-26 MG TAKE 1 TABLET BY MOUTH 2 (TWO) TIMES DAILY. 60 tablet 3  . famotidine (PEPCID) 40 MG tablet TAKE 1 TABLET BY MOUTH EVERY DAY IN THE EVENING 90 tablet 3  . furosemide (LASIX) 20 MG tablet TAKE 1 TABLET BY MOUTH EVERY DAY 90 tablet 2  . insulin glargine (LANTUS SOLOSTAR) 100 UNIT/ML Solostar Pen Inject 35 Units into the skin daily at 10 pm. 15 mL 3  . Insulin Pen Needle 32G X 6 MM MISC 1 each by Does not apply route daily. 100 each 12  . KLOR-CON M20 20 MEQ tablet TAKE 2 TABLETS (40 MEQ  TOTAL) BY MOUTH 2 (TWO) TIMES DAILY. 360 tablet 2  . lamoTRIgine (LAMICTAL) 100 MG tablet Take 1 tablet (100 mg total) by mouth daily. 90 tablet 0  . metoCLOPramide (REGLAN) 10 MG tablet TAKE 1 TABLET (10 MG TOTAL) BY MOUTH 3 (THREE) TIMES DAILY WITH MEALS. 90 tablet 1  . pantoprazole (PROTONIX) 40 MG tablet TAKE 1 TABLET BY MOUTH EVERY DAY 90 tablet 1  . PARoxetine (PAXIL) 30 MG tablet Take 1 tablet (30 mg total) by mouth daily. 90 tablet 1  . risperiDONE (RISPERDAL) 2 MG tablet Take 1 tablet (2 mg total) by mouth at bedtime. 90 tablet 0  . rosuvastatin (CRESTOR) 40 MG tablet TAKE 1 TABLET BY MOUTH EVERY DAY 90 tablet 1  . spironolactone (ALDACTONE)  25 MG tablet Take 25 mg by mouth daily.    . sucralfate (CARAFATE) 1 g tablet TAKE 1 TABLET (1 G TOTAL) BY MOUTH 4 (FOUR) TIMES DAILY. 360 tablet 3  . Vitamin D, Ergocalciferol, (DRISDOL) 1.25 MG (50000 UNIT) CAPS capsule TAKE 1 CAPSULE (50,000 UNITS TOTAL) BY MOUTH EVERY 7 (SEVEN) DAYS 4 capsule 0  . zolpidem (AMBIEN) 5 MG tablet Take 1 tablet (5 mg total) by mouth at bedtime as needed for sleep. 30 tablet 1   No current facility-administered medications for this encounter.    Allergies  Allergen Reactions  . Levothyroxine Rash      Social History   Socioeconomic History  . Marital status: Married    Spouse name: Not on file  . Number of children: 2  . Years of education: Not on file  . Highest education level: High school graduate  Occupational History    Comment: disabled  Tobacco Use  . Smoking status: Former Smoker    Types: Cigarettes    Quit date: 1985    Years since quitting: 36.9  . Smokeless tobacco: Never Used  . Tobacco comment: quit over  20 years ago   Vaping Use  . Vaping Use: Never used  Substance and Sexual Activity  . Alcohol use: Not Currently  . Drug use: Yes    Frequency: 1.0 times per week    Types: Marijuana    Comment: occassionally  . Sexual activity: Yes    Birth control/protection: Surgical     Comment: one partner  Other Topics Concern  . Not on file  Social History Narrative   Volunteers occasionally    Social Determinants of Health   Financial Resource Strain:   . Difficulty of Paying Living Expenses: Not on file  Food Insecurity:   . Worried About Charity fundraiser in the Last Year: Not on file  . Ran Out of Food in the Last Year: Not on file  Transportation Needs:   . Lack of Transportation (Medical): Not on file  . Lack of Transportation (Non-Medical): Not on file  Physical Activity:   . Days of Exercise per Week: Not on file  . Minutes of Exercise per Session: Not on file  Stress:   . Feeling of Stress : Not on file  Social Connections:   . Frequency of Communication with Friends and Family: Not on file  . Frequency of Social Gatherings with Friends and Family: Not on file  . Attends Religious Services: Not on file  . Active Member of Clubs or Organizations: Not on file  . Attends Archivist Meetings: Not on file  . Marital Status: Not on file  Intimate Partner Violence:   . Fear of Current or Ex-Partner: Not on file  . Emotionally Abused: Not on file  . Physically Abused: Not on file  . Sexually Abused: Not on file      Family History  Problem Relation Age of Onset  . Hypertension Mother   . Hyperlipidemia Mother   . Heart disease Mother   . Hypertension Sister   . Asthma Sister   . Heart disease Sister   . Diabetes Sister   . Cancer Sister   . Alzheimer's disease Maternal Grandfather   . Hyperlipidemia Brother   . Asthma Sister   . Hypertension Sister   . Diabetes Sister     Vitals:   06/29/20 1122  BP: 140/80  Pulse: 80  SpO2: 97%  Weight: 82 kg (180 lb 12.8  oz)   Wt Readings from Last 3 Encounters:  06/29/20 82 kg (180 lb 12.8 oz)  05/09/20 80.3 kg (177 lb)  04/13/20 80.3 kg (177 lb)    PHYSICAL EXAM: General:  Well appearing. No resp difficulty HEENT: normal Neck: supple. no JVD. Carotids 2+ bilat; no bruits. No  lymphadenopathy or thryomegaly appreciated. Cor: PMI nondisplaced. Regular rate & rhythm. No rubs, gallops or murmurs. Lungs: clear Abdomen: obese, soft, nontender, nondistended. No hepatosplenomegaly. No bruits or masses. Good bowel sounds. Extremities: no cyanosis, clubbing, rash, edema Neuro: alert & orientedx3, cranial nerves grossly intact. moves all 4 extremities w/o difficulty. Affect pleasant   ASSESSMENT & PLAN:  1. Chronic Systolic/Diastolic  Heart Failure , NICM. St Jude single chamber ICD. 05/2018 RHC/LHC cors normal. Cardiac output 3. Cardiac Index 1.56.  - CPX 07/2018 -slope 37, RER 1.1  VO 2 11.8  - ECHO 06/08/2018 EF 25-30% Grade II DD. - Echo 7/21 EF 20-25% RV ok.  - She is improved. NYHA II at worst. Euvolemic on exam today - Continue spironolactone to 25 mg daily.  - Continue carvedilol 12.5 mg twice a day. - Continue digoxin 0.125 mg daily.  - Change Lasix 20 mg to M/W/F, OK to take extra if needed - Increase Entresto to 49/51bid mg twice a day. (decreased from 49/51 in 5/21 due to low BP) Check BP - set up CR in [place] - Repeat Echo - ICD interrogated as above    2. HTN  - BP up, increase Entresto as above - Check BP at home  3. CKD Stage II  - repeat BMET today  4. DMII - On Jardiance 25 - On insulin per PCP.   5. OSA - CPAP at home but machine broken - follow back with sleep medicine   6. Obesity  Body mass index is 30.09 kg/m.  - encouraged portion control and trying to increase 15 minute daily walks to 5-6x/week  7. Hypokalemia - BMET today  Glori Bickers MD 11:43 AM

## 2020-06-30 NOTE — Addendum Note (Signed)
Encounter addended by: Dolores Patty, MD on: 06/30/2020 1:36 PM  Actions taken: Charge Capture section accepted

## 2020-06-30 NOTE — Telephone Encounter (Signed)
Requested medications are due for refill today yes  Requested medications are on the active medication list yes  Last refill 8/16   Notes to clinic Not Delegated

## 2020-07-08 ENCOUNTER — Other Ambulatory Visit (HOSPITAL_COMMUNITY): Payer: Self-pay | Admitting: Adult Health

## 2020-07-11 ENCOUNTER — Encounter: Payer: Self-pay | Admitting: Family Medicine

## 2020-07-11 ENCOUNTER — Other Ambulatory Visit: Payer: Self-pay

## 2020-07-11 ENCOUNTER — Ambulatory Visit (INDEPENDENT_AMBULATORY_CARE_PROVIDER_SITE_OTHER): Payer: Medicare Other | Admitting: Family Medicine

## 2020-07-11 VITALS — BP 120/84 | HR 74 | Temp 98.6°F | Ht 65.47 in | Wt 182.0 lb

## 2020-07-11 DIAGNOSIS — N1831 Chronic kidney disease, stage 3a: Secondary | ICD-10-CM | POA: Diagnosis not present

## 2020-07-11 DIAGNOSIS — Z794 Long term (current) use of insulin: Secondary | ICD-10-CM

## 2020-07-11 DIAGNOSIS — E1122 Type 2 diabetes mellitus with diabetic chronic kidney disease: Secondary | ICD-10-CM

## 2020-07-11 DIAGNOSIS — I129 Hypertensive chronic kidney disease with stage 1 through stage 4 chronic kidney disease, or unspecified chronic kidney disease: Secondary | ICD-10-CM

## 2020-07-11 LAB — BAYER DCA HB A1C WAIVED: HB A1C (BAYER DCA - WAIVED): 6.8 % (ref <7.0)

## 2020-07-11 MED ORDER — LANTUS SOLOSTAR 100 UNIT/ML ~~LOC~~ SOPN: 35.0000 [IU] | PEN_INJECTOR | Freq: Every day | SUBCUTANEOUS | 3 refills | Status: DC

## 2020-07-11 MED ORDER — ROSUVASTATIN CALCIUM 40 MG PO TABS
40.0000 mg | ORAL_TABLET | Freq: Every day | ORAL | 1 refills | Status: DC
Start: 2020-07-11 — End: 2020-10-11

## 2020-07-11 MED ORDER — EMPAGLIFLOZIN 25 MG PO TABS
25.0000 mg | ORAL_TABLET | Freq: Every day | ORAL | 1 refills | Status: DC
Start: 2020-07-11 — End: 2021-02-15

## 2020-07-11 MED ORDER — TRULICITY 4.5 MG/0.5ML ~~LOC~~ SOAJ: 4.5000 mg | SUBCUTANEOUS | 1 refills | Status: AC

## 2020-07-11 NOTE — Assessment & Plan Note (Signed)
Doing great with A1c of 6.8. Continue current regimen. Continue to monitor. Refills given today. Call with any concerns.

## 2020-07-11 NOTE — Assessment & Plan Note (Signed)
Under good control on current regimen. Continue current regimen. Continue to monitor. Call with any concerns. Refills given. Labs drawn today.   

## 2020-07-11 NOTE — Progress Notes (Signed)
BP 120/84   Pulse 74   Temp 98.6 F (37 C) (Oral)   Ht 5' 5.47" (1.663 m)   Wt 182 lb (82.6 kg)   SpO2 98%   BMI 29.85 kg/m    Subjective:    Patient ID: Bonnie Ochoa, female    DOB: 11/09/1965, 54 y.o.   MRN: [id number]  HPI: Stuart Guillen is a 54 y.o. female  Chief Complaint  Patient presents with  . Diabetes  . Depression   DIABETES Hypoglycemic episodes:no Polydipsia/polyuria: no Visual disturbance: no Chest pain: no Paresthesias: no Glucose Monitoring: no  Accucheck frequency: Not Checking Taking Insulin?: yes  Long acting insulin: 35 units Blood Pressure Monitoring: not checking Retinal Examination: Up to Date Foot Exam: Up to Date Diabetic Education: Completed Pneumovax: Up to Date Influenza: Up to Date Aspirin: no  HYPERTENSION Hypertension status: controlled  Satisfied with current treatment? yes Duration of hypertension: chronic BP monitoring frequency:  not checking BP medication side effects:  no Medication compliance: excellent compliance Aspirin: no Recurrent headaches: no Visual changes: no Palpitations: no Dyspnea: no Chest pain: no Lower extremity edema: no Dizzy/lightheaded: no  Relevant past medical, surgical, family and social history reviewed and updated as indicated. Interim medical history since our last visit reviewed. Allergies and medications reviewed and updated.  Review of Systems  Constitutional: Negative.   Respiratory: Negative.   Cardiovascular: Negative.   Gastrointestinal: Negative.   Genitourinary: Negative.   Musculoskeletal: Negative.   Neurological: Negative.   Psychiatric/Behavioral: Negative.     Per HPI unless specifically indicated above     Objective:    BP 120/84   Pulse 74   Temp 98.6 F (37 C) (Oral)   Ht 5' 5.47" (1.663 m)   Wt 182 lb (82.6 kg)   SpO2 98%   BMI 29.85 kg/m   Wt Readings from Last 3 Encounters:  07/11/20 182 lb (82.6 kg)  06/29/20 180 lb 12.8 oz (82 kg)  05/09/20 177  lb (80.3 kg)    Physical Exam Vitals and nursing note reviewed.  Constitutional:      General: She is not in acute distress.    Appearance: Normal appearance. She is not ill-appearing, toxic-appearing or diaphoretic.  HENT:     Head: Normocephalic and atraumatic.     Right Ear: External ear normal.     Left Ear: External ear normal.     Nose: Nose normal.     Mouth/Throat:     Mouth: Mucous membranes are moist.     Pharynx: Oropharynx is clear.  Eyes:     General: No scleral icterus.       Right eye: No discharge.        Left eye: No discharge.     Extraocular Movements: Extraocular movements intact.     Conjunctiva/sclera: Conjunctivae normal.     Pupils: Pupils are equal, round, and reactive to light.  Cardiovascular:     Rate and Rhythm: Normal rate and regular rhythm.     Pulses: Normal pulses.     Heart sounds: Normal heart sounds. No murmur heard.  No friction rub. No gallop.   Pulmonary:     Effort: Pulmonary effort is normal. No respiratory distress.     Breath sounds: Normal breath sounds. No stridor. No wheezing, rhonchi or rales.  Chest:     Chest wall: No tenderness.  Musculoskeletal:        General: Normal range of motion.     Cervical back: Normal range of motion  and neck supple.  Skin:    General: Skin is warm and dry.     Capillary Refill: Capillary refill takes less than 2 seconds.     Coloration: Skin is not jaundiced or pale.     Findings: No bruising, erythema, lesion or rash.  Neurological:     General: No focal deficit present.     Mental Status: She is alert and oriented to person, place, and time. Mental status is at baseline.  Psychiatric:        Mood and Affect: Mood normal.        Behavior: Behavior normal.        Thought Content: Thought content normal.        Judgment: Judgment normal.     Results for orders placed or performed during the hospital encounter of 06/29/20  Basic metabolic panel  Result Value Ref Range   Sodium 138 135 -  145 mmol/L   Potassium 4.1 3.5 - 5.1 mmol/L   Chloride 103 98 - 111 mmol/L   CO2 27 22 - 32 mmol/L   Glucose, Bld 173 (H) 70 - 99 mg/dL   BUN 14 6 - 20 mg/dL   Creatinine, Ser 3.24 (H) 0.44 - 1.00 mg/dL   Calcium 40.1 8.9 - 02.7 mg/dL   GFR, Estimated 59 (L) >60 mL/min   Anion gap 8 5 - 15  Digoxin level  Result Value Ref Range   Digoxin Level 1.0 1.0 - 2.0 ng/mL      Assessment & Plan:   Problem List Items Addressed This Visit      Endocrine   DM (diabetes mellitus), type 2 with renal complications (HCC) - Primary    Doing great with A1c of 6.8. Continue current regimen. Continue to monitor. Refills given today. Call with any concerns.       Relevant Medications   rosuvastatin (CRESTOR) 40 MG tablet   insulin glargine (LANTUS SOLOSTAR) 100 UNIT/ML Solostar Pen   empagliflozin (JARDIANCE) 25 MG TABS tablet   Dulaglutide (TRULICITY) 4.5 MG/0.5ML SOPN   Other Relevant Orders   Bayer DCA Hb A1c Waived   Comprehensive metabolic panel   Lipid Panel w/o Chol/HDL Ratio     Genitourinary   Benign hypertensive renal disease    Under good control on current regimen. Continue current regimen. Continue to monitor. Call with any concerns. Refills given. Labs drawn today.       Relevant Orders   Comprehensive metabolic panel       Follow up plan: Return in about 6 months (around 01/08/2021).

## 2020-07-12 ENCOUNTER — Telehealth: Payer: Self-pay

## 2020-07-12 ENCOUNTER — Ambulatory Visit
Admission: RE | Admit: 2020-07-12 | Discharge: 2020-07-12 | Disposition: A | Discharge status: Home / Self Care | Payer: Medicare Other | Source: Ambulatory Visit | Attending: Family Medicine | Admitting: Family Medicine

## 2020-07-12 ENCOUNTER — Encounter: Payer: Self-pay | Admitting: Psychiatry

## 2020-07-12 ENCOUNTER — Telehealth (INDEPENDENT_AMBULATORY_CARE_PROVIDER_SITE_OTHER): Payer: Medicare Other | Admitting: Psychiatry

## 2020-07-12 DIAGNOSIS — F410 Panic disorder [episodic paroxysmal anxiety] without agoraphobia: Secondary | ICD-10-CM

## 2020-07-12 DIAGNOSIS — F3131 Bipolar disorder, current episode depressed, mild: Secondary | ICD-10-CM | POA: Diagnosis not present

## 2020-07-12 DIAGNOSIS — F1210 Cannabis abuse, uncomplicated: Secondary | ICD-10-CM

## 2020-07-12 DIAGNOSIS — F5105 Insomnia due to other mental disorder: Secondary | ICD-10-CM

## 2020-07-12 DIAGNOSIS — F4312 Post-traumatic stress disorder, chronic: Secondary | ICD-10-CM | POA: Diagnosis not present

## 2020-07-12 DIAGNOSIS — Z1231 Encounter for screening mammogram for malignant neoplasm of breast: Secondary | ICD-10-CM

## 2020-07-12 LAB — LIPID PANEL W/O CHOL/HDL RATIO
Cholesterol, Total: 152 mg/dL (ref 100–199)
HDL: 39 mg/dL — ABNORMAL LOW (ref >39)
LDL Chol Calc (NIH): 84 mg/dL (ref 0–99)
Triglycerides: 168 mg/dL — ABNORMAL HIGH (ref 0–149)
VLDL Cholesterol Cal: 29 mg/dL (ref 5–40)

## 2020-07-12 LAB — COMPREHENSIVE METABOLIC PANEL
ALT: 18 IU/L (ref 0–32)
AST: 14 IU/L (ref 0–40)
Albumin/Globulin Ratio: 1.3 (ref 1.2–2.2)
Albumin: 4.4 g/dL (ref 3.8–4.9)
Alkaline Phosphatase: 87 IU/L (ref 44–121)
BUN/Creatinine Ratio: 12 (ref 9–23)
BUN: 14 mg/dL (ref 6–24)
Bilirubin Total: 0.6 mg/dL (ref 0.0–1.2)
CO2: 23 mmol/L (ref 20–29)
Calcium: 8.6 mg/dL — ABNORMAL LOW (ref 8.7–10.2)
Chloride: 106 mmol/L (ref 96–106)
Creatinine, Ser: 1.17 mg/dL — ABNORMAL HIGH (ref 0.57–1.00)
GFR calc Af Amer: 60 mL/min/{1.73_m2} (ref >59)
GFR calc non Af Amer: 52 mL/min/{1.73_m2} — ABNORMAL LOW (ref >59)
Globulin, Total: 3.5 g/dL (ref 1.5–4.5)
Glucose: 116 mg/dL — ABNORMAL HIGH (ref 65–99)
Potassium: 4.2 mmol/L (ref 3.5–5.2)
Sodium: 142 mmol/L (ref 134–144)
Total Protein: 7.9 g/dL (ref 6.0–8.5)

## 2020-07-12 MED ORDER — ZOLPIDEM TARTRATE 5 MG PO TABS: 5.0000 mg | ORAL_TABLET | Freq: Every evening | ORAL | 0 refills | Status: DC | PRN

## 2020-07-12 MED ORDER — LAMOTRIGINE 150 MG PO TABS: 150.0000 mg | ORAL_TABLET | Freq: Every day | ORAL | 0 refills | Status: DC

## 2020-07-12 NOTE — Telephone Encounter (Signed)
Called pt to schedule a time for her to come into the office today no answer left vm

## 2020-07-12 NOTE — Progress Notes (Signed)
Virtual Visit via Video Note  I connected with Bonnie Ochoa on 07/12/20 at  9:00 AM EST by a video enabled telemedicine application and verified that I am speaking with the correct person using two identifiers.  Location Provider Location : [facility] Patient Location : Home  Participants: Patient ,Sister, Provider    I discussed the limitations of evaluation and management by telemedicine and the availability of in person appointments. The patient expressed understanding and agreed to proceed.   I discussed the assessment and treatment plan with the patient. The patient was provided an opportunity to ask questions and all were answered. The patient agreed with the plan and demonstrated an understanding of the instructions.   The patient was advised to call back or seek an in-person evaluation if the symptoms worsen or if the condition fails to improve as anticipated.   [place] MD OP Progress Note  07/12/2020 12:30 PM Bonnie Ochoa  MRN:  [id number]  Chief Complaint:  Chief Complaint    Follow-up     HPI: Bonnie Ochoa is a 54 year old African-American female, engaged, lives in [place], has a history of PTSD, bipolar disorder, panic disorder, cannabis use disorder, insomnia, OSA on CPAP, RLS, diabetes, cirrhosis of liver, chronic renal failure, congestive heart failure, recent cataract surgery, hypertension was evaluated by telemedicine today.  Patient today reports she continues to have episodes of feeling down.  She reports it could be because she does not have much to do.  She is interested in picking up a hobby.  She reports she has not started psychotherapy sessions yet.  She reports she had some problem with her phone last time she tried to connect with her therapist.  She agrees to give her therapist a call.  She reports she is compliant on medications as prescribed.  Denies side effects.  She reports sleep is improved.  She denies suicidality, homicidality or perceptual  disturbances.  She reports she has been using cannabis-2 joints 2-3 times a week.  She also uses CBD.  Patient denies any other concerns today.  Visit Diagnosis:    ICD-10-CM   1. Bipolar 1 disorder, depressed, mild (HCC)  F31.31 lamoTRIgine (LAMICTAL) 150 MG tablet  2. Chronic post-traumatic stress disorder (PTSD)  F43.12   3. Panic disorder  F41.0   4. Cannabis use disorder, mild, abuse  F12.10   5. Insomnia due to mental condition  F51.05 zolpidem (AMBIEN) 5 MG tablet    Past Psychiatric History: I have reviewed past psychiatric history from my progress note from 10/16/2017  Past Medical History:  Past Medical History:  Diagnosis Date  . Abdominal pain    a. 05/2018 HIDA scan wnl.  . ADHD   . AICD (automatic cardioverter/defibrillator) present    a. 11/2014 s/p SJM Fortify Assura, single lead AICD (ser# [id number]).  Marland Kitchen Anemia   . Arthritis   . Asthma   . Bipolar 1 disorder ([place])   . Chest pain    a. Hx of cath in [place] - reportedly nl; b. 04/2018 MV: EF 22%, fixed dist ant septal, apical, and inferoapical defects - ? scar vs. attenuation. No ischemia.  . CHF (congestive heart failure) ([place])   . Cirrhosis of liver ([place])   . Coronary artery disease   . Depression   . Diabetes mellitus without complication ([place])   . Diverticulitis   . Dysrhythmia   . Heart murmur   . HFrEF (heart failure with reduced ejection fraction) ([place])    a. 06/2017 Echo: EF 20-25%, diff  HK. Mildly dil LA; b. 05/2018 Echo: EF 25-30%, diff HK, Gr2 DD. Triv AI. Mod MR. Mildly reduced RV fxn. Mod-Sev TR. PASP 45-34mHg.  .Marland KitchenHypertension   . Hypothyroidism   . IBS (irritable bowel syndrome)   . Insomnia   . Migraines   . NICM ([place]ischemic cardiomyopathy) ([place]    a. EF prev 25%; b. 11/2014 s/p SJM Fortify Assura, single lead AICD (ser# [id number]; c. 06/2017 Echo: EF 20-25%; d. 05/2018 Echo: EF 25-30%, diff HK, Gr2 DD; e. 05/2018 Echo: EF 25-30%; f. 05/2018 Cath: Nl cors. LVEDP 223mg, PCWP 3226m. PA 65/40 (52).  CO/CI 3.04/1.56; c. 07/2018 CPX: Mod HF limitation.  . OSA (obstructive sleep apnea)    CPAP is broken  . PTSD (post-traumatic stress disorder)   . Restless leg syndrome   . Vertigo     Past Surgical History:  Procedure Laterality Date  . ABDOMINAL HYSTERECTOMY    . CARDIAC CATHETERIZATION    . CATARACT EXTRACTION W/PHACO Right 01/27/2019   Procedure: CATARACT EXTRACTION PHACO AND INTRAOCULAR LENS PLACEMENT (IOC[place]allard[place]ION BLUE RIGHT DIABETES;  Surgeon: PorBirder RobsonD;  [facility];  Laterality: Right;  Diabetes - insulin sleep apnea  . CATARACT EXTRACTION W/PHACO Left 03/08/2020   Procedure: CATARACT EXTRACTION PHACO AND INTRAOCULAR LENS PLACEMENT (IOC) LEFT DIABETIC 10.28  00:57.8;  Surgeon: PorBirder RobsonD;  [place];  Laterality: Left;  . COLONOSCOPY WITH PROPOFOL N/A 03/11/2019   Procedure: COLONOSCOPY WITH PROPOFOL;  Surgeon: TahVirgel ManifoldD;  Location: [facility];  Service: Endoscopy;  Laterality: N/A;  . CORONARY ARTERY BYPASS GRAFT     Pt denies  . ESOPHAGOGASTRODUODENOSCOPY (EGD) WITH PROPOFOL N/A 03/11/2019   Procedure: ESOPHAGOGASTRODUODENOSCOPY (EGD) WITH PROPOFOL;  Surgeon: TahVirgel ManifoldD;  Location: [facility];  Service: Endoscopy;  Laterality: N/A;  . INSERT / REPLACE / REMOVE PACEMAKER     ICD  . INSERTION OF ICD  2016   St Jude Single chamber ICD  . RIGHT/LEFT HEART CATH AND CORONARY ANGIOGRAPHY N/A 06/09/2018   Procedure: RIGHT/LEFT HEART CATH AND CORONARY ANGIOGRAPHY;  Surgeon: AriWellington HampshireD;  [facility];  Service: Cardiovascular;  Laterality: N/A;  . TONSILLECTOMY    . TUBAL LIGATION  1980    Family Psychiatric History: I have reviewed family psychiatric history from my progress note on 10/16/2017  Family History:  Family History  Problem Relation Age of Onset  . Hypertension Mother   . Hyperlipidemia Mother    . Heart disease Mother   . Hypertension Sister   . Asthma Sister   . Heart disease Sister   . Diabetes Sister   . Cancer Sister   . Alzheimer's disease Maternal Grandfather   . Hyperlipidemia Brother   . Asthma Sister   . Hypertension Sister   . Diabetes Sister   . Breast cancer Paternal Aunt     Social History: I have reviewed social history from my progress note on 10/16/2017 Social History   Socioeconomic History  . Marital status: Married    Spouse name: Not on file  . Number of children: 2  . Years of education: Not on file  . Highest education level: High school graduate  Occupational History    Comment: disabled  Tobacco Use  . Smoking status: Former Smoker    Types: Cigarettes    Quit date: 1985    Years since quitting: 36.9  . Smokeless tobacco: Never Used  .  Tobacco comment: quit over  20 years ago   Vaping Use  . Vaping Use: Never used  Substance and Sexual Activity  . Alcohol use: Not Currently  . Drug use: Yes    Frequency: 1.0 times per week    Types: Marijuana    Comment: occassionally  . Sexual activity: Yes    Birth control/protection: Surgical    Comment: one partner  Other Topics Concern  . Not on file  Social History Narrative   Volunteers occasionally    Social Determinants of Health   Financial Resource Strain:   . Difficulty of Paying Living Expenses: Not on file  Food Insecurity:   . Worried About Charity fundraiser in the Last Year: Not on file  . Ran Out of Food in the Last Year: Not on file  Transportation Needs:   . Lack of Transportation (Medical): Not on file  . Lack of Transportation (Non-Medical): Not on file  Physical Activity:   . Days of Exercise per Week: Not on file  . Minutes of Exercise per Session: Not on file  Stress:   . Feeling of Stress : Not on file  Social Connections:   . Frequency of Communication with Friends and Family: Not on file  . Frequency of Social Gatherings with Friends and Family: Not on file   . Attends Religious Services: Not on file  . Active Member of Clubs or Organizations: Not on file  . Attends Archivist Meetings: Not on file  . Marital Status: Not on file    Allergies:  Allergies  Allergen Reactions  . Levothyroxine Rash    Metabolic Disorder Labs: Lab Results  Component Value Date   HGBA1C 6.8 07/11/2020   MPG 185.77 05/08/2018   MPG >398 07/03/2017   No results found for: PROLACTIN Lab Results  Component Value Date   CHOL 152 07/11/2020   TRIG 168 (H) 07/11/2020   HDL 39 (L) 07/11/2020   CHOLHDL 5.4 05/09/2018   VLDL 53 (H) 05/09/2018   LDLCALC 84 07/11/2020   LDLCALC 121 (H) 09/28/2019   Lab Results  Component Value Date   TSH 1.290 03/02/2020   TSH 1.420 09/28/2019    Therapeutic Level Labs: No results found for: LITHIUM No results found for: VALPROATE No components found for:  CBMZ  Current Medications: Current Outpatient Medications  Medication Sig Dispense Refill  . blood glucose meter kit and supplies KIT Dispense based on patient and insurance preference. Use up to four times daily as directed. (FOR ICD-9 250.00, 250.01). 1 each 0  . carvedilol (COREG) 12.5 MG tablet Take 12.5 mg by mouth 2 (two) times daily with a meal.    . cyclobenzaprine (FLEXERIL) 5 MG tablet TAKE 1 TABLET BY MOUTH THREE TIMES A DAY AS NEEDED FOR MUSCLE SPASMS 60 tablet 1  . digoxin (LANOXIN) 0.125 MG tablet TAKE 1 TABLET BY MOUTH DAILY 90 tablet 3  . Dulaglutide (TRULICITY) 4.5 UG/8.9VQ SOPN Inject 4.5 mg as directed once a week. 6 mL 1  . empagliflozin (JARDIANCE) 25 MG TABS tablet Take 1 tablet (25 mg total) by mouth daily. 90 tablet 1  . famotidine (PEPCID) 40 MG tablet TAKE 1 TABLET BY MOUTH EVERY DAY IN THE EVENING 90 tablet 3  . furosemide (LASIX) 20 MG tablet Take 1 tablet (20 mg total) by mouth as directed. Take 62m (1 tablet) Monday, Wednesday, Friday. 12 tablet 2  . insulin glargine (LANTUS SOLOSTAR) 100 UNIT/ML Solostar Pen Inject 35 Units  into  the skin daily at 10 pm. 15 mL 3  . Insulin Pen Needle 32G X 6 MM MISC 1 each by Does not apply route daily. 100 each 12  . KLOR-CON M20 20 MEQ tablet TAKE 2 TABLETS (40 MEQ TOTAL) BY MOUTH 2 (TWO) TIMES DAILY. 360 tablet 2  . lamoTRIgine (LAMICTAL) 150 MG tablet Take 1 tablet (150 mg total) by mouth daily. 90 tablet 0  . metoCLOPramide (REGLAN) 10 MG tablet TAKE 1 TABLET (10 MG TOTAL) BY MOUTH 3 (THREE) TIMES DAILY WITH MEALS. 90 tablet 1  . pantoprazole (PROTONIX) 40 MG tablet TAKE 1 TABLET BY MOUTH EVERY DAY 90 tablet 1  . PARoxetine (PAXIL) 30 MG tablet Take 1 tablet (30 mg total) by mouth daily. 90 tablet 1  . risperiDONE (RISPERDAL) 2 MG tablet TAKE 1 TABLET (2 MG TOTAL) BY MOUTH AT BEDTIME. 90 tablet 0  . rosuvastatin (CRESTOR) 40 MG tablet Take 1 tablet (40 mg total) by mouth daily. 90 tablet 1  . sacubitril-valsartan (ENTRESTO) 49-51 MG Take 1 tablet by mouth 2 (two) times daily. 60 tablet 3  . spironolactone (ALDACTONE) 25 MG tablet Take 25 mg by mouth daily.    . sucralfate (CARAFATE) 1 g tablet TAKE 1 TABLET (1 G TOTAL) BY MOUTH 4 (FOUR) TIMES DAILY. 360 tablet 3  . Vitamin D, Ergocalciferol, (DRISDOL) 1.25 MG (50000 UNIT) CAPS capsule TAKE 1 CAPSULE (50,000 UNITS TOTAL) BY MOUTH EVERY 7 (SEVEN) DAYS 4 capsule 0  . zolpidem (AMBIEN) 5 MG tablet Take 1 tablet (5 mg total) by mouth at bedtime as needed for sleep. 30 tablet 1  . zolpidem (AMBIEN) 5 MG tablet Take 1 tablet (5 mg total) by mouth at bedtime as needed for sleep. 30 tablet 0   No current facility-administered medications for this visit.     Musculoskeletal: Strength & Muscle Tone: UTA Gait & Station: UTA Patient leans: N/A  Psychiatric Specialty Exam: Review of Systems  Psychiatric/Behavioral: Positive for dysphoric mood.  All other systems reviewed and are negative.   There were no vitals taken for this visit.There is no height or weight on file to calculate BMI.  General Appearance: Casual  Eye Contact:   Fair  Speech:  Clear and Coherent  Volume:  Normal  Mood:  Depressed  Affect:  Congruent  Thought Process:  Goal Directed and Descriptions of Associations: Intact  Orientation:  Full (Time, Place, and Person)  Thought Content: Logical   Suicidal Thoughts:  No  Homicidal Thoughts:  No  Memory:  Immediate;   Fair Recent;   Fair Remote;   Fair  Judgement:  Fair  Insight:  Fair  Psychomotor Activity:  Normal  Concentration:  Concentration: Fair and Attention Span: Fair  Recall:  [employer] of Knowledge: Fair  Language: Fair  Akathisia:  No  Handed:  Right  AIMS (if indicated):UTA  Assets:  Communication Skills Desire for Improvement Housing Social Support  ADL's:  Intact  Cognition: WNL  Sleep:  Fair   Screenings: GAD-7     Office Visit from 12/28/2016 in [facility]  Total GAD-7 Score 21    PHQ2-9     Office Visit from 07/11/2020 in [place] Visit from 09/28/2019 in [facility] from 05/18/2019 in [facility] Patient Outreach Telephone from 06/16/2018 in [facility] Visit from 05/08/2018 in [facility] [facility]  PHQ-2 Total Score 3 0 1 0 1  PHQ-9 Total Score 6  1 -- -- --       Assessment and Plan: Bonnie Ochoa is a 54 year old African-American female who has a history of bipolar disorder, PTSD, panic attacks, cannabis use disorder, congestive heart failure, liver disease, chronic renal failure, diabetes melitis, RLS, multiple medical problems was evaluated by telemedicine today.  Patient is currently making progress however continues to struggle with depressive symptoms.  Discussed plan as noted below.  Plan Bipolar disorder depressed-some progress Risperidone 2 mg p.o. nightly Increase lamotrigine to 150 mg p.o. daily. Paxil 30 mg p.o. daily  PTSD-stable Paxil as prescribed  Panic disorder-stable Paxil as  prescribed  Insomnia-improving Ambien 5 mg p.o. nightly as needed Continue CPAP for OSA  Cannabis use disorder-unstable Provided counseling.  Encouraged patient to schedule an appointment with therapist.  Follow-up in clinic in 1 month or sooner if needed.  I have spent atleast 20 minutes face to face by video with patient today. More than 50 % of the time was spent for preparing to see the patient ( e.g., review of test, records ), ordering medications and test ,psychoeducation and supportive psychotherapy and care coordination,as well as documenting clinical information in electronic health record. This note was generated in part or whole with voice recognition software. Voice recognition is usually quite accurate but there are transcription errors that can and very often do occur. I apologize for any typographical errors that were not detected and corrected.        Bonnie Alert, MD 07/12/2020, 12:30 PM

## 2020-07-13 ENCOUNTER — Ambulatory Visit (HOSPITAL_COMMUNITY): Admission: RE | Admit: 2020-07-13 | Payer: Medicare Other | Source: Ambulatory Visit

## 2020-07-14 ENCOUNTER — Other Ambulatory Visit: Payer: Self-pay

## 2020-07-14 ENCOUNTER — Ambulatory Visit (HOSPITAL_COMMUNITY)
Admission: RE | Admit: 2020-07-14 | Discharge: 2020-07-14 | Disposition: A | Discharge status: Home / Self Care | Payer: Medicare Other | Source: Ambulatory Visit | Attending: Internal Medicine | Admitting: Internal Medicine

## 2020-07-14 DIAGNOSIS — I081 Rheumatic disorders of both mitral and tricuspid valves: Secondary | ICD-10-CM | POA: Insufficient documentation

## 2020-07-14 DIAGNOSIS — I700 Atherosclerosis of aorta: Secondary | ICD-10-CM | POA: Insufficient documentation

## 2020-07-14 DIAGNOSIS — I5022 Chronic systolic (congestive) heart failure: Secondary | ICD-10-CM

## 2020-07-14 DIAGNOSIS — I110 Hypertensive heart disease with heart failure: Secondary | ICD-10-CM | POA: Insufficient documentation

## 2020-07-14 DIAGNOSIS — E119 Type 2 diabetes mellitus without complications: Secondary | ICD-10-CM | POA: Insufficient documentation

## 2020-07-14 DIAGNOSIS — I2510 Atherosclerotic heart disease of native coronary artery without angina pectoris: Secondary | ICD-10-CM | POA: Insufficient documentation

## 2020-07-16 LAB — ECHOCARDIOGRAM COMPLETE
Calc EF: 34.7 %
MV M vel: 5.58 m/s
MV Peak grad: 124.5 mmHg
Radius: 0.4 cm
S' Lateral: 5.9 cm
Single Plane A2C EF: 35 %
Single Plane A4C EF: 35.6 %

## 2020-07-21 ENCOUNTER — Telehealth: Payer: Self-pay | Admitting: Family Medicine

## 2020-07-21 NOTE — Telephone Encounter (Signed)
Copied from CRM [id number]. Topic: Medicare AWV >> Jul 21, 2020 11:10 AM Claudette Laws R wrote: Reason for CRM:  Left message for patient to call back and schedule the Medicare Annual Wellness Visit (AWV) virtually.  Last AWV 05/18/2019  Please schedule at anytime with [facility] Health Advisor.  45 minute appointment  Any questions, please call me at [phone]

## 2020-08-11 ENCOUNTER — Telehealth (INDEPENDENT_AMBULATORY_CARE_PROVIDER_SITE_OTHER): Payer: Medicare Other | Admitting: Psychiatry

## 2020-08-11 ENCOUNTER — Encounter: Payer: Self-pay | Admitting: Psychiatry

## 2020-08-11 ENCOUNTER — Other Ambulatory Visit: Payer: Self-pay

## 2020-08-11 DIAGNOSIS — F3131 Bipolar disorder, current episode depressed, mild: Secondary | ICD-10-CM

## 2020-08-11 DIAGNOSIS — F4312 Post-traumatic stress disorder, chronic: Secondary | ICD-10-CM

## 2020-08-11 DIAGNOSIS — F410 Panic disorder [episodic paroxysmal anxiety] without agoraphobia: Secondary | ICD-10-CM

## 2020-08-11 DIAGNOSIS — F1210 Cannabis abuse, uncomplicated: Secondary | ICD-10-CM | POA: Diagnosis not present

## 2020-08-11 DIAGNOSIS — F5105 Insomnia due to other mental disorder: Secondary | ICD-10-CM

## 2020-08-11 NOTE — Progress Notes (Signed)
Virtual Visit via Telephone Note  I connected with Bonnie Ochoa on 08/11/20 at  9:20 AM EST by telephone and verified that I am speaking with the correct person using two identifiers.  Location Provider Location : [facility] Patient Location : Home  Participants: Patient , Provider   I discussed the limitations, risks, security and privacy concerns of performing an evaluation and management service by telephone and the availability of in person appointments. I also discussed with the patient that there may be a patient responsible charge related to this service. The patient expressed understanding and agreed to proceed.    I discussed the assessment and treatment plan with the patient. The patient was provided an opportunity to ask questions and all were answered. The patient agreed with the plan and demonstrated an understanding of the instructions.   The patient was advised to call back or seek an in-person evaluation if the symptoms worsen or if the condition fails to improve as anticipated.   [facility] [facility] MD OP Progress Note  08/11/2020 5:54 PM Bonnie Ochoa  MRN:  [id number]  Chief Complaint:  Chief Complaint    Follow-up     HPI: Bonnie Ochoa is a 54 year old African-American female, engaged, lives in [place], has a history of PTSD, bipolar disorder, panic disorder, cannabis use disorder, insomnia, OSA on CPAP, RLS, diabetes, cirrhosis of liver, chronic renal failure, congestive heart failure, hypertension, cataract surgery was evaluated by telemedicine today.  Patient today reports she had a good Christmas.  She reports she is currently making progress with regards to her depressive symptoms.  The medications are helpful.  The Lamictal dosage was increased last time and she is tolerating it well.  Patient reports sleep as good.  Patient continues to use cannabis-1 joint every day.  Patient agrees to cut back.  Patient denies any suicidality, homicidality or perceptual  disturbances.  Patient denies any other concerns today. Visit Diagnosis:    ICD-10-CM   1. Bipolar 1 disorder, depressed, mild ([place])  F31.31   2. Chronic post-traumatic stress disorder (PTSD)  F43.12   3. Panic disorder  F41.0   4. Cannabis use disorder, mild, abuse  F12.10   5. Insomnia due to mental condition  F51.05     Past Psychiatric History: I have reviewed past psychiatric history from my progress note on 10/16/2017  Past Medical History:  Past Medical History:  Diagnosis Date  . Abdominal pain    a. 05/2018 HIDA scan wnl.  . ADHD   . AICD (automatic cardioverter/defibrillator) present    a. 11/2014 s/p SJM Fortify Assura, single lead AICD (ser# [id number]).  Marland Kitchen Anemia   . Arthritis   . Asthma   . Bipolar 1 disorder ([place])   . Chest pain    a. Hx of cath in [place] - reportedly nl; b. 04/2018 MV: EF 22%, fixed dist ant septal, apical, and inferoapical defects - ? scar vs. attenuation. No ischemia.  . CHF (congestive heart failure) ([place])   . Cirrhosis of liver ([place])   . Coronary artery disease   . Depression   . Diabetes mellitus without complication ([place])   . Diverticulitis   . Dysrhythmia   . Heart murmur   . HFrEF (heart failure with reduced ejection fraction) ([place])    a. 06/2017 Echo: EF 20-25%, diff HK. Mildly dil LA; b. 05/2018 Echo: EF 25-30%, diff HK, Gr2 DD. Triv AI. Mod MR. Mildly reduced RV fxn. Mod-Sev TR. PASP 45-70mHg.  .Marland KitchenHypertension   . Hypothyroidism   .  IBS (irritable bowel syndrome)   . Insomnia   . Migraines   . NICM (nonischemic cardiomyopathy) ([place])    a. EF prev 25%; b. 11/2014 s/p SJM Fortify Assura, single lead AICD (ser# [id number]); c. 06/2017 Echo: EF 20-25%; d. 05/2018 Echo: EF 25-30%, diff HK, Gr2 DD; e. 05/2018 Echo: EF 25-30%; f. 05/2018 Cath: Nl cors. LVEDP 48mHg, PCWP 356mg. PA 65/40 (52). CO/CI 3.04/1.56; c. 07/2018 CPX: Mod HF limitation.  . OSA (obstructive sleep apnea)    CPAP is broken  . PTSD (post-traumatic stress disorder)   . Restless  leg syndrome   . Vertigo     Past Surgical History:  Procedure Laterality Date  . ABDOMINAL HYSTERECTOMY    . CARDIAC CATHETERIZATION    . CATARACT EXTRACTION W/PHACO Right 01/27/2019   Procedure: CATARACT EXTRACTION PHACO AND INTRAOCULAR LENS PLACEMENT ([place] VISION BLUE RIGHT DIABETES;  Surgeon: PoBirder RobsonMD;  [place] Service: Ophthal[facility];  Laterality: Right;  Diabetes - insulin sleep apnea  . CATARACT EXTRACTION W/PHACO Left 03/08/2020   Procedure: CATARACT EXTRACTION PHACO AND INTRAOCULAR LENS PLACEMENT (I[facility]) LEFT DIABETIC 10.28  00:57.8;  Surgeon: PoBirder RobsonMD;  [facility]: [facility]hthal[facility];  Laterality: Left;  . COLONOSCOPY WITH PROPOFOL N/A 03/11/2019   Procedure: COLONOSCOPY WITH PROPOFOL;  Surgeon: TaVirgel ManifoldMD;  Location: [facility] ENDOSCOPY;  Service: Endoscopy;  Laterality: N/A;  . CORONARY ARTERY BYPASS GRAFT     Pt denies  . ESOPHAGOGASTRODUODENOSCOPY (EGD) WITH PROPOFOL N/A 03/11/2019   Procedure: ESOPHAGOGASTRODUODENOSCOPY (EGD) WITH PROPOFOL;  Surgeon: TaVirgel ManifoldMD;  Location: [facility] ENDOSCOPY;  Service: Endoscopy;  Laterality: N/A;  . INSERT / REPLACE / REMOVE PACEMAKER     ICD  . INSERTION OF ICD  2016   St Jude Single chamber ICD  . RIGHT/LEFT HEART CATH AND CORONARY ANGIOGRAPHY N/A 06/09/2018   Procedure: RIGHT/LEFT HEART CATH AND CORONARY ANGIOGRAPHY;  Surgeon: ArWellington HampshireMD;  [facility];  Service: Cardiovascular;  Laterality: N/A;  . TONSILLECTOMY    . TUBAL LIGATION  1980    Family Psychiatric History: I have reviewed family psychiatric history from my progress note on 10/16/2017  Family History:  Family History  Problem Relation Age of Onset  . Hypertension Mother   . Hyperlipidemia Mother   . Heart disease Mother   . Hypertension Sister   . Asthma Sister   . Heart disease Sister   . Diabetes Sister   . Cancer Sister   . Alzheimer's disease  Maternal Grandfather   . Hyperlipidemia Brother   . Asthma Sister   . Hypertension Sister   . Diabetes Sister   . Breast cancer Paternal Aunt     Social History: Reviewed social history from my progress note on 10/16/2017 Social History   Socioeconomic History  . Marital status: Married    Spouse name: Not on file  . Number of children: 2  . Years of education: Not on file  . Highest education level: High school graduate  Occupational History    Comment: disabled  Tobacco Use  . Smoking status: Former Smoker    Types: Cigarettes    Quit date: 1985    Years since quitting: 37.0  . Smokeless tobacco: Never Used  . Tobacco comment: quit over  20 years ago   Vaping Use  . Vaping Use: Never used  Substance and Sexual Activity  . Alcohol use: Not Currently  . Drug use: Yes  Frequency: 1.0 times per week    Types: Marijuana    Comment: occassionally  . Sexual activity: Yes    Birth control/protection: Surgical    Comment: one partner  Other Topics Concern  . Not on file  Social History Narrative   Volunteers occasionally    Social Determinants of Health   Financial Resource Strain: Not on file  Food Insecurity: Not on file  Transportation Needs: Not on file  Physical Activity: Not on file  Stress: Not on file  Social Connections: Not on file    Allergies:  Allergies  Allergen Reactions  . Levothyroxine Rash    Metabolic Disorder Labs: Lab Results  Component Value Date   HGBA1C 6.8 07/11/2020   MPG 185.77 05/08/2018   MPG >398 07/03/2017   No results found for: PROLACTIN Lab Results  Component Value Date   CHOL 152 07/11/2020   TRIG 168 (H) 07/11/2020   HDL 39 (L) 07/11/2020   CHOLHDL 5.4 05/09/2018   VLDL 53 (H) 05/09/2018   LDLCALC 84 07/11/2020   LDLCALC 121 (H) 09/28/2019   Lab Results  Component Value Date   TSH 1.290 03/02/2020   TSH 1.420 09/28/2019    Therapeutic Level Labs: No results found for: LITHIUM No results found for:  VALPROATE No components found for:  CBMZ  Current Medications: Current Outpatient Medications  Medication Sig Dispense Refill  . blood glucose meter kit and supplies KIT Dispense based on patient and insurance preference. Use up to four times daily as directed. (FOR ICD-9 250.00, 250.01). 1 each 0  . carvedilol (COREG) 12.5 MG tablet Take 12.5 mg by mouth 2 (two) times daily with a meal.    . cyclobenzaprine (FLEXERIL) 5 MG tablet TAKE 1 TABLET BY MOUTH THREE TIMES A DAY AS NEEDED FOR MUSCLE SPASMS 60 tablet 1  . digoxin (LANOXIN) 0.125 MG tablet TAKE 1 TABLET BY MOUTH DAILY 90 tablet 3  . Dulaglutide (TRULICITY) 4.5 AS/5.0NL SOPN Inject 4.5 mg as directed once a week. 6 mL 1  . empagliflozin (JARDIANCE) 25 MG TABS tablet Take 1 tablet (25 mg total) by mouth daily. 90 tablet 1  . famotidine (PEPCID) 40 MG tablet TAKE 1 TABLET BY MOUTH EVERY DAY IN THE EVENING 90 tablet 3  . furosemide (LASIX) 20 MG tablet Take 1 tablet (20 mg total) by mouth as directed. Take 74m (1 tablet) Monday, Wednesday, Friday. 12 tablet 2  . insulin glargine (LANTUS SOLOSTAR) 100 UNIT/ML Solostar Pen Inject 35 Units into the skin daily at 10 pm. 15 mL 3  . Insulin Pen Needle 32G X 6 MM MISC 1 each by Does not apply route daily. 100 each 12  . KLOR-CON M20 20 MEQ tablet TAKE 2 TABLETS (40 MEQ TOTAL) BY MOUTH 2 (TWO) TIMES DAILY. 360 tablet 2  . lamoTRIgine (LAMICTAL) 150 MG tablet Take 1 tablet (150 mg total) by mouth daily. 90 tablet 0  . metoCLOPramide (REGLAN) 10 MG tablet TAKE 1 TABLET (10 MG TOTAL) BY MOUTH 3 (THREE) TIMES DAILY WITH MEALS. 90 tablet 1  . pantoprazole (PROTONIX) 40 MG tablet TAKE 1 TABLET BY MOUTH EVERY DAY 90 tablet 1  . PARoxetine (PAXIL) 30 MG tablet Take 1 tablet (30 mg total) by mouth daily. 90 tablet 1  . risperiDONE (RISPERDAL) 2 MG tablet TAKE 1 TABLET (2 MG TOTAL) BY MOUTH AT BEDTIME. 90 tablet 0  . rosuvastatin (CRESTOR) 40 MG tablet Take 1 tablet (40 mg total) by mouth daily. 90 tablet 1   .  sacubitril-valsartan (ENTRESTO) 49-51 MG Take 1 tablet by mouth 2 (two) times daily. 60 tablet 3  . spironolactone (ALDACTONE) 25 MG tablet Take 25 mg by mouth daily.    . sucralfate (CARAFATE) 1 g tablet TAKE 1 TABLET (1 G TOTAL) BY MOUTH 4 (FOUR) TIMES DAILY. 360 tablet 3  . Vitamin D, Ergocalciferol, (DRISDOL) 1.25 MG (50000 UNIT) CAPS capsule TAKE 1 CAPSULE (50,000 UNITS TOTAL) BY MOUTH EVERY 7 (SEVEN) DAYS 4 capsule 0  . zolpidem (AMBIEN) 5 MG tablet Take 1 tablet (5 mg total) by mouth at bedtime as needed for sleep. 30 tablet 0   No current facility-administered medications for this visit.     Musculoskeletal: Strength & Muscle Tone: UTA Gait & Station: UTA Patient leans: N/A  Psychiatric Specialty Exam: Review of Systems  Psychiatric/Behavioral: Positive for dysphoric mood.  All other systems reviewed and are negative.   There were no vitals taken for this visit.There is no height or weight on file to calculate BMI.  General Appearance: UTA  Eye Contact:  UTA  Speech:  Clear and Coherent  Volume:  Normal  Mood:  Depressed  Affect:  UTA  Thought Process:  Goal Directed and Descriptions of Associations: Intact  Orientation:  Full (Time, Place, and Person)  Thought Content: Logical   Suicidal Thoughts:  No  Homicidal Thoughts:  No  Memory:  Immediate;   Fair Recent;   Fair Remote;   Fair  Judgement:  Fair  Insight:  Fair  Psychomotor Activity:  Normal  Concentration:  Concentration: Fair and Attention Span: Fair  Recall:  [employer] of Knowledge: Fair  Language: Fair  Akathisia:  No  Handed:  Right  AIMS (if indicated): UTA  Assets:  Communication Skills Desire for Improvement Housing Social Support  ADL's:  Intact  Cognition: WNL  Sleep:  Fair   Screenings: GAD-7   Flowsheet Row Office Visit from 12/28/2016 in [place]  Total GAD-7 Score 21    PHQ2-9   [place] Visit from 07/11/2020 in [place] Visit  from 09/28/2019 in [place] from 05/18/2019 in [facility] Patient Outreach Telephone from 06/16/2018 in [place] Visit from 05/08/2018 in [place]  PHQ-2 Total Score 3 0 1 0 1  PHQ-9 Total Score 6 1 -- -- --       Assessment and Plan: Kady Toothaker is a 54 year old African-American female who has a history of bipolar disorder, PTSD, panic attacks, cannabis use disorder, congestive heart failure, liver disease, chronic renal failure, diabetes melitis, RLS, multiple medical problems was evaluated by telemedicine today.  Patient is currently making progress on the current medication regimen.  Plan as noted below.  Plan  Bipolar disorder depressed-improving Risperidone 2 mg p.o. nightly Lamotrigine 150 mg p.o. daily Paxil 30 mg p.o. daily  PTSD-stable Paxil as prescribed  Panic disorder-stable Paxil as prescribed  Insomnia-improving Ambien 5 mg p.o. nightly as needed Continue CPAP for OSA  Cannabis use disorder-stable Provided counseling  Encouraged patient to schedule appointment with therapist.  I have also communicated with staff here.  Follow-up in clinic in 1 to 2 months or sooner if needed.  I have spent atleast 20 minutes non face to face  with patient today. More than 50 % of the time was spent for preparing to see the patient ( e.g., review of test, records ), ordering medications and test ,psychoeducation and supportive psychotherapy and care  coordination,as well as documenting clinical information in electronic health record. This note was generated in part or whole with voice recognition software. Voice recognition is usually quite accurate but there are transcription errors that can and very often do occur. I apologize for any typographical errors that were not detected and corrected.      [name], MD 08/11/2020, 5:54 PM

## 2020-08-25 ENCOUNTER — Other Ambulatory Visit: Payer: Self-pay | Admitting: Family Medicine

## 2020-09-09 ENCOUNTER — Telehealth: Payer: Self-pay

## 2020-09-09 ENCOUNTER — Other Ambulatory Visit: Payer: Self-pay | Admitting: Family Medicine

## 2020-09-09 ENCOUNTER — Ambulatory Visit (INDEPENDENT_AMBULATORY_CARE_PROVIDER_SITE_OTHER): Payer: Medicare Other

## 2020-09-09 VITALS — Ht 65.0 in | Wt 174.0 lb

## 2020-09-09 DIAGNOSIS — Z0000 Encounter for general adult medical examination without abnormal findings: Secondary | ICD-10-CM

## 2020-09-09 NOTE — Progress Notes (Signed)
I connected with [name] today by telephone and verified that I am speaking with the correct person using two identifiers. Location patient: home Location provider: work Persons participating in the virtual visit: [name], Glenna Durand LPN.   I discussed the limitations, risks, security and privacy concerns of performing an evaluation and management service by telephone and the availability of in person appointments. I also discussed with the patient that there may be a patient responsible charge related to this service. The patient expressed understanding and verbally consented to this telephonic visit.    Interactive audio and video telecommunications were attempted between this provider and patient, however failed, due to patient having technical difficulties OR patient did not have access to video capability.  We continued and completed visit with audio only.     Vital signs may be patient reported or missing.  Subjective:   Bonnie Ochoa is a 55 y.o. female who presents for Medicare Annual (Subsequent) preventive examination.  Review of Systems     Cardiac Risk Factors include: diabetes mellitus;sedentary lifestyle     Objective:    Today's Vitals   09/09/20 0941  Weight: 174 lb (78.9 kg)  Height: 5' 5" (1.651 m)   Body mass index is 28.96 kg/m.  Advanced Directives 09/09/2020 03/08/2020 03/11/2019 01/27/2019 10/25/2018 10/24/2018 09/04/2018  Does Patient Have a Medical Advance Directive? _0  No No  Type of Advance Directive - - - - - - -  Does patient want to make changes to medical advance directive? - - - - - - -  Copy of [place] in Chart? - - - - - - -  Would patient like information on creating a medical advance directive? - Yes (MAU/Ambulatory/Procedural Areas - Information given) No - Patient declined No - Patient declined Yes (Inpatient - patient requests chaplain consult to create a medical advance directive) No - Patient  declined -  Some encounter information is confidential and restricted. Go to Review Flowsheets activity to see all data.    Current Medications (verified) Outpatient Encounter Medications as of 09/09/2020  Medication Sig  . blood glucose meter kit and supplies KIT Dispense based on patient and insurance preference. Use up to four times daily as directed. (FOR ICD-9 250.00, 250.01).  . carvedilol (COREG) 12.5 MG tablet Take 12.5 mg by mouth 2 (two) times daily with a meal.  . cyclobenzaprine (FLEXERIL) 5 MG tablet TAKE 1 TABLET BY MOUTH THREE TIMES A DAY AS NEEDED FOR MUSCLE SPASMS  . digoxin (LANOXIN) 0.125 MG tablet TAKE 1 TABLET BY MOUTH DAILY  . Dulaglutide (TRULICITY) 4.5 ZO/1.0RU SOPN Inject 4.5 mg as directed once a week.  . empagliflozin (JARDIANCE) 25 MG TABS tablet Take 1 tablet (25 mg total) by mouth daily.  . famotidine (PEPCID) 40 MG tablet TAKE 1 TABLET BY MOUTH EVERY DAY IN THE EVENING  . furosemide (LASIX) 20 MG tablet Take 1 tablet (20 mg total) by mouth as directed. Take 56m (1 tablet) Monday, Wednesday, Friday.  . insulin glargine (LANTUS SOLOSTAR) 100 UNIT/ML Solostar Pen Inject 35 Units into the skin daily at 10 pm.  . Insulin Pen Needle 32G X 6 MM MISC 1 each by Does not apply route daily.  .Marland KitchenKLOR-CON M20 20 MEQ tablet TAKE 2 TABLETS (40 MEQ TOTAL) BY MOUTH 2 (TWO) TIMES DAILY.  .Marland KitchenlamoTRIgine (LAMICTAL) 150 MG tablet Take 1 tablet (150 mg total) by mouth daily.  . pantoprazole (PROTONIX) 40 MG tablet TAKE 1 TABLET  BY MOUTH EVERY DAY  . PARoxetine (PAXIL) 30 MG tablet Take 1 tablet (30 mg total) by mouth daily.  . risperiDONE (RISPERDAL) 2 MG tablet TAKE 1 TABLET (2 MG TOTAL) BY MOUTH AT BEDTIME.  . rosuvastatin (CRESTOR) 40 MG tablet Take 1 tablet (40 mg total) by mouth daily.  . sacubitril-valsartan (ENTRESTO) 49-51 MG Take 1 tablet by mouth 2 (two) times daily.  Marland Kitchen spironolactone (ALDACTONE) 25 MG tablet Take 25 mg by mouth daily.  . sucralfate (CARAFATE) 1 g tablet  TAKE 1 TABLET (1 G TOTAL) BY MOUTH 4 (FOUR) TIMES DAILY.  Marland Kitchen zolpidem (AMBIEN) 5 MG tablet Take 1 tablet (5 mg total) by mouth at bedtime as needed for sleep.  . metoCLOPramide (REGLAN) 10 MG tablet TAKE 1 TABLET (10 MG TOTAL) BY MOUTH 3 (THREE) TIMES DAILY WITH MEALS.  Marland Kitchen Vitamin D, Ergocalciferol, (DRISDOL) 1.25 MG (50000 UNIT) CAPS capsule TAKE 1 CAPSULE (50,000 UNITS TOTAL) BY MOUTH EVERY 7 (SEVEN) DAYS (Patient not taking: Reported on 09/09/2020)   No facility-administered encounter medications on file as of 09/09/2020.    Allergies (verified) Levothyroxine   History: Past Medical History:  Diagnosis Date  . Abdominal pain    a. 05/2018 HIDA scan wnl.  . ADHD   . AICD (automatic cardioverter/defibrillator) present    a. 11/2014 s/p SJM Fortify Assura, single lead AICD (ser# [id number]).  Marland Kitchen Anemia   . Arthritis   . Asthma   . Bipolar 1 disorder ([place])   . Chest pain    a. Hx of cath in [place] - reportedly nl; b. 04/2018 MV: EF 22%, fixed dist ant septal, apical, and inferoapical defects - ? scar vs. attenuation. No ischemia.  . CHF (congestive heart failure) ([place])   . Cirrhosis of liver ([place])   . Coronary artery disease   . Depression   . Diabetes mellitus without complication ([place])   . Diverticulitis   . Dysrhythmia   . Heart murmur   . HFrEF (heart failure with reduced ejection fraction) ([place])    a. 06/2017 Echo: EF 20-25%, diff HK. Mildly dil LA; b. 05/2018 Echo: EF 25-30%, diff HK, Gr2 DD. Triv AI. Mod MR. Mildly reduced RV fxn. Mod-Sev TR. PASP 45-4mHg.  .Marland KitchenHypertension   . Hypothyroidism   . IBS (irritable bowel syndrome)   . Insomnia   . Migraines   . NICM (nonischemic cardiomyopathy) ([place]    a. EF prev 25%; b. 11/2014 s/p SJM Fortify Assura, single lead AICD (ser# [id number]; c. 06/2017 Echo: EF 20-25%; d. 05/2018 Echo: EF 25-30%, diff HK, Gr2 DD; e. 05/2018 Echo: EF 25-30%; f. 05/2018 Cath: Nl cors. LVEDP 272mg, PCWP 3279m. PA 65/40 (52). CO/CI 3.04/1.56; c. 07/2018 CPX: Mod  HF limitation.  . OSA (obstructive sleep apnea)    CPAP is broken  . PTSD (post-traumatic stress disorder)   . Restless leg syndrome   . Vertigo    Past Surgical History:  Procedure Laterality Date  . ABDOMINAL HYSTERECTOMY    . CARDIAC CATHETERIZATION    . CATARACT EXTRACTION W/PHACO Right 01/27/2019   Procedure: CATARACT EXTRACTION PHACO AND INTRAOCULAR LENS PLACEMENT ([place] BLUE RIGHT DIABETES;  Surgeon: PorBirder RobsonD;  [facility];  Laterality: Right;  Diabetes - insulin sleep apnea  . CATARACT EXTRACTION W/PHACO Left 03/08/2020   Procedure: CATARACT EXTRACTION PHACO AND INTRAOCULAR LENS PLACEMENT (IOC) LEFT DIABETIC 10.28  00:57.8;  Surgeon: PorBirder RobsonD;  [place] [place];  Laterality:  Left;  . COLONOSCOPY WITH PROPOFOL N/A 03/11/2019   Procedure: COLONOSCOPY WITH PROPOFOL;  Surgeon: Virgel Manifold, MD;  Location: [facility];  Service: Endoscopy;  Laterality: N/A;  . CORONARY ARTERY BYPASS GRAFT     Pt denies  . ESOPHAGOGASTRODUODENOSCOPY (EGD) WITH PROPOFOL N/A 03/11/2019   Procedure: ESOPHAGOGASTRODUODENOSCOPY (EGD) WITH PROPOFOL;  Surgeon: Virgel Manifold, MD;  Location: [facility] ENDOSCOPY;  Service: Endoscopy;  Laterality: N/A;  . INSERT / REPLACE / REMOVE PACEMAKER     ICD  . INSERTION OF ICD  2016   St Jude Single chamber ICD  . RIGHT/LEFT HEART CATH AND CORONARY ANGIOGRAPHY N/A 06/09/2018   Procedure: RIGHT/LEFT HEART CATH AND CORONARY ANGIOGRAPHY;  Surgeon: Wellington Hampshire, MD;  Location: [facility];  Service: Cardiovascular;  Laterality: N/A;  . TONSILLECTOMY    . TUBAL LIGATION  1980   Family History  Problem Relation Age of Onset  . Hypertension Mother   . Hyperlipidemia Mother   . Heart disease Mother   . Hypertension Sister   . Asthma Sister   . Heart disease Sister   . Diabetes Sister   . Cancer Sister   . Alzheimer's disease Maternal  Grandfather   . Hyperlipidemia Brother   . Asthma Sister   . Hypertension Sister   . Diabetes Sister   . Breast cancer Paternal Aunt    Social History   Socioeconomic History  . Marital status: Married    Spouse name: Not on file  . Number of children: 2  . Years of education: Not on file  . Highest education level: High school graduate  Occupational History    Comment: disabled  Tobacco Use  . Smoking status: Former Smoker    Types: Cigarettes    Quit date: 1985    Years since quitting: 37.0  . Smokeless tobacco: Never Used  . Tobacco comment: quit over  20 years ago   Vaping Use  . Vaping Use: Never used  Substance and Sexual Activity  . Alcohol use: Not Currently  . Drug use: Yes    Frequency: 1.0 times per week    Types: Marijuana    Comment: occassionally  . Sexual activity: Yes    Birth control/protection: Surgical    Comment: one partner  Other Topics Concern  . Not on file  Social History Narrative   Volunteers occasionally    Social Determinants of Health   Financial Resource Strain: Low Risk   . Difficulty of Paying Living Expenses: Not hard at all  Food Insecurity: Food Insecurity Present  . Worried About Charity fundraiser in the Last Year: Sometimes true  . Ran Out of Food in the Last Year: Sometimes true  Transportation Needs: No Transportation Needs  . Lack of Transportation (Medical): No  . Lack of Transportation (Non-Medical): No  Physical Activity: Insufficiently Active  . Days of Exercise per Week: 3 days  . Minutes of Exercise per Session: 30 min  Stress: No Stress Concern Present  . Feeling of Stress : Not at all  Social Connections: Not on file    Tobacco Counseling Counseling given: Not Answered Comment: quit over  20 years ago    Clinical Intake:  Pre-visit preparation completed: Yes  Pain : No/denies pain     Nutritional Status: BMI 25 -29 Overweight Nutritional Risks: None Diabetes: Yes  How often do you need to  have someone help you when you read instructions, pamphlets, or other written materials from your doctor or  pharmacy?: 1 - Never What is the last grade level you completed in school?: 12th grade  Diabetic? Yes Nutrition Risk Assessment:  Has the patient had any N/V/D within the last 2 months?  No  Does the patient have any non-healing wounds?  No  Has the patient had any unintentional weight loss or weight gain?  No   Diabetes:  Is the patient diabetic?  Yes  If diabetic, was a CBG obtained today?  No  Did the patient bring in their glucometer from home?  No  How often do you monitor your CBG's? 1-2 weekly.   Financial Strains and Diabetes Management:  Are you having any financial strains with the device, your supplies or your medication? No .  Does the patient want to be seen by Chronic Care Management for management of their diabetes?  No  Would the patient like to be referred to a Nutritionist or for Diabetic Management?  No   Diabetic Exams:  Diabetic Eye Exam: Completed 12/25/2019 Diabetic Foot Exam: Completed 02/04/2020   Interpreter Needed?: No  Information entered by :: NAllen LPN   Activities of Daily Living In your present state of health, do you have any difficulty performing the following activities: 09/09/2020 03/08/2020  Hearing? N N  Vision? N N  Difficulty concentrating or making decisions? N N  Walking or climbing stairs? N N  Dressing or bathing? N N  Doing errands, shopping? N -  Preparing Food and eating ? N -  Using the Toilet? N -  In the past six months, have you accidently leaked urine? N -  Do you have problems with loss of bowel control? N -  Managing your Medications? N -  Managing your Finances? N -  Housekeeping or managing your Housekeeping? N -  Some recent data might be hidden    Patient Care Team: Valerie Roys, DO as PCP - General (Family Medicine) Minna Merritts, MD as PCP - Cardiology (Cardiology) Deboraha Sprang, MD as PCP  - Electrophysiology (Cardiology) Bensimhon, Shaune Pascal, MD as PCP - Advanced Heart Failure (Cardiology) [name], MD as Consulting Physician (Gastroenterology) Minna Merritts, MD as Consulting Physician (Cardiology) Alisa Graff, FNP as Nurse Practitioner (Family Medicine)  Indicate any recent Medical Services you may have received from other than [facility] providers in the past year (date may be approximate).     Assessment:   This is a routine wellness examination for [place].  Hearing/Vision screen  Hearing Screening   125Hz 250Hz 500Hz 1000Hz 2000Hz 3000Hz 4000Hz 6000Hz 8000Hz  Right ear:           Left ear:           Vision Screening Comments: Regular eye exams, [place]  Dietary issues and exercise activities discussed: Current Exercise Habits: Home exercise routine, Type of exercise: walking, Time (Minutes): 30, Frequency (Times/Week): 3, Weekly Exercise (Minutes/Week): 90  Goals    .  "We need to work on diabetes" (pt-stated)      Current Barriers:  . Diabetes: uncontrolled; most recent A1c 13.9%, worsened from 8.3%. Concurrent HFrEF w/ AICD, followed by Dr. Rockey Situ; bipolar disorder followed by Dr. Shea Evans; Cirrhosis followed by Dr. Bonna Gains o Wife notes that she and her sister fill the patient's pill box o Wife notes that they have cut back on purchasing juice and sodas, and have tried to encourage patient to drink more water  . Current antihyperglycemic regimen: Lantus 20 units daily (patient's wife administers), Trulicity 1.5  mg weekly o Jardiance held s/p genitourinary itching; though sugars significantly elevated at that time . Reports hyperglycemic symptoms, including, polydipsia, nocturia, . Current meal patterns: o Breakfast: Sometimes skips; egg, [place] sausage, toast; coffee + splenda  o Lunch: Often skips lunch o Supper: Wife cooks; [place], Loss adjuster, chartered, vegables  o Snacks: None  o Drinks: Stopped drinking juice;  . Current exercise: Active up and down  stairs, walks her dog when it isn't hot outside . Current blood glucose readings: 150-200s (after breakfast); HS: 150-200 o Patient often sleeps late and wife is already at work when she wakes up and eats breakfast, so is not checking fasting sugars  . Cardiovascular risk reduction: o Current hypertensive/CHF regimen: carvedilol 12.5 mg BID, digoxin 0.125 mg daily, Entresto 49/51 mg BID, spironolactone 25 mg daily  o Current hyperlipidemia regimen: atorvastatin 20 mg daily; LDL unable to be calculated on last check 10/2018 d/t significantly elevated triglycerides, but was 84 on last check 04/2018; not at goal <70  Pharmacist Clinical Goal(s):  Marland Kitchen Over the next 90 days, patient with work with PharmD and primary care provider to address optimized diabetes management  Interventions: . Confirmed Trulicity and insulin dosing.  . Educated patient's wife on goal A1c, goal fasting, and goal post prandial readings. Asked if some fasting readings could be obtained in the next few days before f/u with Dr. Wynetta Emery. Requested she bring these readings to the appointment. Wife expressed understanding, and noted that she would try to attend the appointment as well  . As patient is more normoglycemic, she likely would have less glucosuria with SGLT2 therapy and tolerate better. SGLT2 therapy would be recommended with concurrent CHF. Consider restarting Jardiance, or restarting with 1/2 of previously prescription for 25 mg daily for ~a week to assess tolerability. Educated wife on HF benefit of Jardiance.   Patient Self Care Activities:  . Patient will check blood glucose BID, document, and provide at future appointments . Patient will take medications as prescribed  Please see past updates related to this goal by clicking on the "Past Updates" button in the selected goal      .  DIET - INCREASE WATER INTAKE      Recommend drinking at least 6-8 glasses of water a day     .  Patient Stated      09/09/2020, no  goals      Depression Screen PHQ 2/9 Scores 09/09/2020 07/11/2020 09/28/2019 05/18/2019 06/16/2018 05/08/2018 04/23/2018  PHQ - 2 Score 0 3 0 1 0 1 0  PHQ- 9 Score - 6 1 - - - -    Fall Risk Fall Risk  09/09/2020 07/11/2020 09/28/2019 05/18/2019 07/17/2018  Falls in the past year? 0 0 0 0 (No Data)  Comment - - - - Denies new/ recent falls  Number falls in past yr: - - 0 0 -  Injury with Fall? - - 0 0 -  Risk for fall due to : Medication side effect - - - -  Follow up Falls evaluation completed;Education provided;Falls prevention discussed - - - Falls prevention discussed    FALL RISK PREVENTION PERTAINING TO THE HOME:  Any stairs in or around the home? Yes  If so, are there any without handrails? No  Home free of loose throw rugs in walkways, pet beds, electrical cords, etc? Yes  Adequate lighting in your home to reduce risk of falls? Yes   ASSISTIVE DEVICES UTILIZED TO PREVENT FALLS:  Life alert? No  Use of a cane,  walker or w/c? No  Grab bars in the bathroom? No  Shower chair or bench in shower? No  Elevated toilet seat or a handicapped toilet? No   TIMED UP AND GO:  Was the test performed? No .     Cognitive Function:     6CIT Screen 09/09/2020 05/18/2019 02/19/2018  What Year? 0 points 0 points 0 points  What month? 0 points 0 points 0 points  What time? 0 points 0 points 0 points  Count back from 20 0 points 0 points 0 points  Months in reverse 4 points 0 points 0 points  Repeat phrase 8 points 0 points 2 points  Total Score 12 0 2    Immunizations Immunization History  Administered Date(s) Administered  . Hepatitis B, adult 11/03/2018  . Influenza,inj,Quad PF,6+ Mos 05/31/2016, 05/02/2017, 07/04/2017, 04/07/2018, 06/13/2018, 04/13/2019, 05/09/2020  . Janssen (J&J) SARS-COV-2 Vaccination 08/27/2019, 12/25/2019  . Pneumococcal Polysaccharide-23 07/04/2017  . Td 04/07/2018    TDAP status: Up to date  Flu Vaccine status: Up to date  Pneumococcal vaccine status:  Up to date  Covid-19 vaccine status: Completed vaccines  Qualifies for Shingles Vaccine? Yes   Zostavax completed No   Shingrix Completed?: No.    Education has been provided regarding the importance of this vaccine. Patient has been advised to call insurance company to determine out of pocket expense if they have not yet received this vaccine. Advised may also receive vaccine at local pharmacy or Health Dept. Verbalized acceptance and understanding.  Screening Tests Health Maintenance  Topic Date Due  . URINE MICROALBUMIN  09/27/2020  . OPHTHALMOLOGY EXAM  12/24/2020  . HEMOGLOBIN A1C  01/08/2021  . FOOT EXAM  02/03/2021  . MAMMOGRAM  07/12/2022  . TETANUS/TDAP  04/07/2028  . COLONOSCOPY (Pts 45-72yr Insurance coverage will need to be confirmed)  03/10/2029  . INFLUENZA VACCINE  Completed  . PNEUMOCOCCAL POLYSACCHARIDE VACCINE AGE 74-64 HIGH RISK  Completed  . COVID-19 Vaccine  Completed  . Hepatitis C Screening  Completed  . HIV Screening  Completed    Health Maintenance  Health Maintenance Due  Topic Date Due  . URINE MICROALBUMIN  09/27/2020    Colorectal cancer screening: Type of screening: Colonoscopy. Completed 03/11/2019. Repeat every 10 years  Mammogram status: Completed 07/12/2020. Repeat every year  Bone Density status: n/a  Lung Cancer Screening: (Low Dose CT Chest recommended if Age 57-80years, 30 pack-year currently smoking OR have quit w/in 15years.) does not qualify.   Lung Cancer Screening Referral: no  Additional Screening:  Hepatitis C Screening: does qualify; Completed 05/30/2018  Vision Screening: Recommended annual ophthalmology exams for early detection of glaucoma and other disorders of the eye. Is the patient up to date with their annual eye exam?  Yes  Who is the provider or what is the name of the office in which the patient attends annual eye exams[facility] a provider, would they like to be referred  to a provider to establish care? No .   Dental Screening: Recommended annual dental exams for proper oral hygiene  Community Resource Referral / Chronic Care Management: CRR required this visit?  Yes   CCM required this visit?  No      Plan:     I have personally reviewed and noted the following in the patient's chart:   . Medical and social history . Use of alcohol, tobacco or illicit drugs  . Current medications and supplements . Functional  ability and status . Nutritional status . Physical activity . Advanced directives . List of other physicians . Hospitalizations, surgeries, and ER visits in previous 12 months . Vitals . Screenings to include cognitive, depression, and falls . Referrals and appointments  In addition, I have reviewed and discussed with patient certain preventive protocols, quality metrics, and best practice recommendations. A written personalized care plan for preventive services as well as general preventive health recommendations were provided to patient.     Kellie Simmering, LPN   [id number]   Nurse Notes:

## 2020-09-09 NOTE — Patient Instructions (Signed)
Bonnie Ochoa , Thank you for taking time to come for your Medicare Wellness Visit. I appreciate your ongoing commitment to your health goals. Please review the following plan we discussed and let me know if I can assist you in the future.   Screening recommendations/referrals: Colonoscopy: completed 03/11/2019 Mammogram: completed 07/12/2020, due 07/12/2021 Bone Density: n/a Recommended yearly ophthalmology/optometry visit for glaucoma screening and checkup Recommended yearly dental visit for hygiene and checkup  Vaccinations: Influenza vaccine: completed 05/09/2020, due 03/13/2021 Pneumococcal vaccine: completed 07/04/2017 Tdap vaccine: completed 04/07/2018, due 04/07/2028 Shingles vaccine: discussed  Covid-19:  08/26/2020, 12/25/2019  Advanced directives: Advance directive discussed with you today.  Conditions/risks identified: none  Next appointment: Follow up in one year for your annual wellness visit.   Preventive Care 40-64 Years, Female Preventive care refers to lifestyle choices and visits with your health care provider that can promote health and wellness. What does preventive care include?  A yearly physical exam. This is also called an annual well check.  Dental exams once or twice a year.  Routine eye exams. Ask your health care provider how often you should have your eyes checked.  Personal lifestyle choices, including:  Daily care of your teeth and gums.  Regular physical activity.  Eating a healthy diet.  Avoiding tobacco and drug use.  Limiting alcohol use.  Practicing safe sex.  Taking low-dose aspirin daily starting at age 43.  Taking vitamin and mineral supplements as recommended by your health care provider. What happens during an annual well check? The services and screenings done by your health care provider during your annual well check will depend on your age, overall health, lifestyle risk factors, and family history of disease. Counseling  Your  health care provider may ask you questions about your:  Alcohol use.  Tobacco use.  Drug use.  Emotional well-being.  Home and relationship well-being.  Sexual activity.  Eating habits.  Work and work Statistician.  Method of birth control.  Menstrual cycle.  Pregnancy history. Screening  You may have the following tests or measurements:  Height, weight, and BMI.  Blood pressure.  Lipid and cholesterol levels. These may be checked every 5 years, or more frequently if you are over 70 years old.  Skin check.  Lung cancer screening. You may have this screening every year starting at age 65 if you have a 30-pack-year history of smoking and currently smoke or have quit within the past 15 years.  Fecal occult blood test (FOBT) of the stool. You may have this test every year starting at age 4.  Flexible sigmoidoscopy or colonoscopy. You may have a sigmoidoscopy every 5 years or a colonoscopy every 10 years starting at age 74.  Hepatitis C blood test.  Hepatitis B blood test.  Sexually transmitted disease (STD) testing.  Diabetes screening. This is done by checking your blood sugar (glucose) after you have not eaten for a while (fasting). You may have this done every 1-3 years.  Mammogram. This may be done every 1-2 years. Talk to your health care provider about when you should start having regular mammograms. This may depend on whether you have a family history of breast cancer.  BRCA-related cancer screening. This may be done if you have a family history of breast, ovarian, tubal, or peritoneal cancers.  Pelvic exam and Pap test. This may be done every 3 years starting at age 74. Starting at age 35, this may be done every 5 years if you have a Pap test in  combination with an HPV test.  Bone density scan. This is done to screen for osteoporosis. You may have this scan if you are at high risk for osteoporosis. Discuss your test results, treatment options, and if  necessary, the need for more tests with your health care provider. Vaccines  Your health care provider may recommend certain vaccines, such as:  Influenza vaccine. This is recommended every year.  Tetanus, diphtheria, and acellular pertussis (Tdap, Td) vaccine. You may need a Td booster every 10 years.  Zoster vaccine. You may need this after age 27.  Pneumococcal 13-valent conjugate (PCV13) vaccine. You may need this if you have certain conditions and were not previously vaccinated.  Pneumococcal polysaccharide (PPSV23) vaccine. You may need one or two doses if you smoke cigarettes or if you have certain conditions. Talk to your health care provider about which screenings and vaccines you need and how often you need them. This information is not intended to replace advice given to you by your health care provider. Make sure you discuss any questions you have with your health care provider. Document Released: 08/26/2015 Document Revised: 04/18/2016 Document Reviewed: 05/31/2015 Elsevier Interactive Patient Education  2017 [employer] Prevention in the Home Falls can cause injuries. They can happen to people of all ages. There are many things you can do to make your home safe and to help prevent falls. What can I do on the outside of my home?  Regularly fix the edges of walkways and driveways and fix any cracks.  Remove anything that might make you trip as you walk through a door, such as a raised step or threshold.  Trim any bushes or trees on the path to your home.  Use bright outdoor lighting.  Clear any walking paths of anything that might make someone trip, such as rocks or tools.  Regularly check to see if handrails are loose or broken. Make sure that both sides of any steps have handrails.  Any raised decks and porches should have guardrails on the edges.  Have any leaves, snow, or ice cleared regularly.  Use sand or salt on walking paths during  winter.  Clean up any spills in your garage right away. This includes oil or grease spills. What can I do in the bathroom?  Use night lights.  Install grab bars by the toilet and in the tub and shower. Do not use towel bars as grab bars.  Use non-skid mats or decals in the tub or shower.  If you need to sit down in the shower, use a plastic, non-slip stool.  Keep the floor dry. Clean up any water that spills on the floor as soon as it happens.  Remove soap buildup in the tub or shower regularly.  Attach bath mats securely with double-sided non-slip rug tape.  Do not have throw rugs and other things on the floor that can make you trip. What can I do in the bedroom?  Use night lights.  Make sure that you have a light by your bed that is easy to reach.  Do not use any sheets or blankets that are too big for your bed. They should not hang down onto the floor.  Have a firm chair that has side arms. You can use this for support while you get dressed.  Do not have throw rugs and other things on the floor that can make you trip. What can I do in the kitchen?  Clean up any spills  right away.  Avoid walking on wet floors.  Keep items that you use a lot in easy-to-reach places.  If you need to reach something above you, use a strong step stool that has a grab bar.  Keep electrical cords out of the way.  Do not use floor polish or wax that makes floors slippery. If you must use wax, use non-skid floor wax.  Do not have throw rugs and other things on the floor that can make you trip. What can I do with my stairs?  Do not leave any items on the stairs.  Make sure that there are handrails on both sides of the stairs and use them. Fix handrails that are broken or loose. Make sure that handrails are as long as the stairways.  Check any carpeting to make sure that it is firmly attached to the stairs. Fix any carpet that is loose or worn.  Avoid having throw rugs at the top or  bottom of the stairs. If you do have throw rugs, attach them to the floor with carpet tape.  Make sure that you have a light switch at the top of the stairs and the bottom of the stairs. If you do not have them, ask someone to add them for you. What else can I do to help prevent falls?  Wear shoes that:  Do not have high heels.  Have rubber bottoms.  Are comfortable and fit you well.  Are closed at the toe. Do not wear sandals.  If you use a stepladder:  Make sure that it is fully opened. Do not climb a closed stepladder.  Make sure that both sides of the stepladder are locked into place.  Ask someone to hold it for you, if possible.  Clearly mark and make sure that you can see:  Any grab bars or handrails.  First and last steps.  Where the edge of each step is.  Use tools that help you move around (mobility aids) if they are needed. These include:  Canes.  Walkers.  Scooters.  Crutches.  Turn on the lights when you go into a dark area. Replace any light bulbs as soon as they burn out.  Set up your furniture so you have a clear path. Avoid moving your furniture around.  If any of your floors are uneven, fix them.  If there are any pets around you, be aware of where they are.  Review your medicines with your doctor. Some medicines can make you feel dizzy. This can increase your chance of falling. Ask your doctor what other things that you can do to help prevent falls. This information is not intended to replace advice given to you by your health care provider. Make sure you discuss any questions you have with your health care provider. Document Released: 05/26/2009 Document Revised: 01/05/2016 Document Reviewed: 09/03/2014 Elsevier Interactive Patient Education  2017 [employer].

## 2020-09-09 NOTE — Addendum Note (Signed)
Addended by: Elisha Ponder E on: 09/09/2020 10:07 AM   Modules accepted: Orders

## 2020-09-09 NOTE — Telephone Encounter (Signed)
Patient consented to a telehealth visit. 

## 2020-09-10 NOTE — Telephone Encounter (Signed)
Requested medication (s) are due for refill today: yes  Requested medication (s) are on the active medication list: yes  Last refill:  06/30/20  Future visit scheduled: yes  Notes to clinic:  med not delegated to NT to RF   Requested Prescriptions  Pending Prescriptions Disp Refills   cyclobenzaprine (FLEXERIL) 5 MG tablet [Pharmacy Med Name: CYCLOBENZAPRINE 5 MG TABLET] 60 tablet 1    Sig: TAKE 1 TABLET BY MOUTH THREE TIMES A DAY AS NEEDED FOR MUSCLE SPASMS      Not Delegated - Analgesics:  Muscle Relaxants Failed - 09/09/2020  8:07 PM      Failed - This refill cannot be delegated      Passed - Valid encounter within last 6 months    Recent Outpatient Visits           2 months ago Type 2 diabetes mellitus with stage 3a chronic kidney disease, with long-term current use of insulin (HCC)   [facility] [place], Megan P, DO   4 months ago Type 2 diabetes mellitus with stage 3a chronic kidney disease, with long-term current use of insulin (HCC)   [facility] [place], Megan P, DO   5 months ago Type 2 diabetes mellitus with stage 3a chronic kidney disease, with long-term current use of insulin (HCC)   [facility] [place], Megan P, DO   5 months ago Procedure not carried out   [facility], Megan P, DO   6 months ago Type 2 diabetes mellitus with stage 3a chronic kidney disease, with long-term current use of insulin (HCC)   [facility] [place], [place], DO       Future Appointments             In 1 month Gollan, Tollie Pizza, MD [facility] [employer]   In 4 months Kulm, Oralia Rud, DO [employer] [facility]   In 1 year  [employer] [facility], [facility]

## 2020-09-12 NOTE — Telephone Encounter (Signed)
Patient last seen 07/11/20 and has appointment 01/10/21.

## 2020-09-13 ENCOUNTER — Telehealth: Payer: Self-pay | Admitting: Family Medicine

## 2020-09-13 NOTE — Telephone Encounter (Signed)
   Telephone encounter was:  Successful.  09/13/2020 Name: Deneka Greenwalt MRN: [id number] DOB: [birth year]  [name] is a 55 y.o. year old female who is a primary care patient of Dorcas Carrow, DO . The community resource team was consulted for assistance with Food Insecurity  Care guide performed the following interventions: Patient provided with information about care guide support team and interviewed to confirm resource needs Investigation of community resources performed Discussed resources to assist with food insecurity since she was denied food stamps and is appealing the decision.  Placed referral to [facility] for a grocery card of $200 and sent the patient an email with a listing of local food banks in the area via application online through care manager funds requests. .  Follow Up Plan:  Care guide will follow up with patient by phone over the next week and Care guide will outreach resources to assist patient with grocery card for $200.   Rojelio Brenner Care Guide, Embedded Care Coordination [facility], Care Management Phone: [phone] Email: [email or web address]

## 2020-09-15 ENCOUNTER — Other Ambulatory Visit: Payer: Self-pay

## 2020-09-15 ENCOUNTER — Encounter: Payer: Self-pay | Admitting: Psychiatry

## 2020-09-15 ENCOUNTER — Telehealth (INDEPENDENT_AMBULATORY_CARE_PROVIDER_SITE_OTHER): Payer: Medicare Other | Admitting: Psychiatry

## 2020-09-15 DIAGNOSIS — Z9119 Patient's noncompliance with other medical treatment and regimen: Secondary | ICD-10-CM

## 2020-09-15 DIAGNOSIS — F3131 Bipolar disorder, current episode depressed, mild: Secondary | ICD-10-CM

## 2020-09-15 DIAGNOSIS — F1210 Cannabis abuse, uncomplicated: Secondary | ICD-10-CM

## 2020-09-15 DIAGNOSIS — Z91199 Patient's noncompliance with other medical treatment and regimen due to unspecified reason: Secondary | ICD-10-CM

## 2020-09-15 DIAGNOSIS — F4312 Post-traumatic stress disorder, chronic: Secondary | ICD-10-CM | POA: Diagnosis not present

## 2020-09-15 DIAGNOSIS — F410 Panic disorder [episodic paroxysmal anxiety] without agoraphobia: Secondary | ICD-10-CM | POA: Diagnosis not present

## 2020-09-15 DIAGNOSIS — F5105 Insomnia due to other mental disorder: Secondary | ICD-10-CM

## 2020-09-15 MED ORDER — RISPERIDONE 0.25 MG PO TABS: 0.2500 mg | ORAL_TABLET | Freq: Every morning | ORAL | 1 refills | Status: DC

## 2020-09-15 MED ORDER — RISPERIDONE 2 MG PO TABS: 2.0000 mg | ORAL_TABLET | Freq: Every day | ORAL | 0 refills | Status: DC

## 2020-09-15 MED ORDER — ZOLPIDEM TARTRATE 5 MG PO TABS: 5.0000 mg | ORAL_TABLET | Freq: Every evening | ORAL | 0 refills | Status: DC | PRN

## 2020-09-15 MED ORDER — LAMOTRIGINE 150 MG PO TABS: 150.0000 mg | ORAL_TABLET | Freq: Every day | ORAL | 0 refills | Status: DC

## 2020-09-15 NOTE — Progress Notes (Signed)
Virtual Visit via Video Note  I connected with Bonnie Ochoa on 09/15/20 at  4:20 PM EST by a video enabled telemedicine application and verified that I am speaking with the correct person using two identifiers.  Location Provider Location : [facility] Patient Location : Home  Participants: Patient , Provider   I discussed the limitations of evaluation and management by telemedicine and the availability of in person appointments. The patient expressed understanding and agreed to proceed.    I discussed the assessment and treatment plan with the patient. The patient was provided an opportunity to ask questions and all were answered. The patient agreed with the plan and demonstrated an understanding of the instructions.   The patient was advised to call back or seek an in-person evaluation if the symptoms worsen or if the condition fails to improve as anticipated.  Video connection was lost at less than 50% of the duration of the visit, at which time the remainder of the visit was completed through audio only  [facility] MD OP Progress Note  09/15/2020 4:43 PM Bonnie Ochoa  MRN:  [id number]  Chief Complaint:  Chief Complaint    Follow-up     HPI: Bonnie Ochoa is a 55 year old African-American female, engaged, lives in [place], has a history of PTSD, bipolar disorder, cannabis use disorder, insomnia, OSA on CPAP, RLS, diabetes, cirrhosis of liver, chronic manage renal failure, congestive heart failure, hypertension, cataract surgery was evaluated by telemedicine today.  Patient today reports she is currently irritable often.  This has been going on since the past few months and getting worse.  She reports she is compliant on all her medications and still continues to be irritable.  She denies any anxiety symptoms.  She denies any sadness.  She reports sleep as good.  She is compliant on all her medications and denies side effects.  She continues to smoke cannabis-1 joint per day.  She has  not cut back yet.  Patient reports good support system from her spouse and also her sister.  Patient denies any suicidality, homicidality or perceptual disturbances.  Patient denies any other concerns today.  Visit Diagnosis:    ICD-10-CM   1. Bipolar 1 disorder, depressed, mild (HCC)  F31.31 risperiDONE (RISPERDAL) 0.25 MG tablet    lamoTRIgine (LAMICTAL) 150 MG tablet  2. Chronic post-traumatic stress disorder (PTSD)  F43.12   3. Panic disorder  F41.0   4. Cannabis use disorder, mild, abuse  F12.10   5. Insomnia due to mental condition  F51.05 zolpidem (AMBIEN) 5 MG tablet  6. Noncompliance with treatment regimen  Z91.19 risperiDONE (RISPERDAL) 2 MG tablet    Past Psychiatric History: I have reviewed past psychiatric history from my progress note on 10/16/2017.  Past Medical History:  Past Medical History:  Diagnosis Date  . Abdominal pain    a. 05/2018 HIDA scan wnl.  . ADHD   . AICD (automatic cardioverter/defibrillator) present    a. 11/2014 s/p SJM Fortify Assura, single lead AICD (ser# [id number]).  Marland Kitchen Anemia   . Arthritis   . Asthma   . Bipolar 1 disorder ([place])   . Chest pain    a. Hx of cath in [place] - reportedly nl; b. 04/2018 MV: EF 22%, fixed dist ant septal, apical, and inferoapical defects - ? scar vs. attenuation. No ischemia.  . CHF (congestive heart failure) ([place])   . Cirrhosis of liver ([place])   . Coronary artery disease   . Depression   . Diabetes mellitus without complication ([place])   .  Diverticulitis   . Dysrhythmia   . Heart murmur   . HFrEF (heart failure with reduced ejection fraction) ([place])    a. 06/2017 Echo: EF 20-25%, diff HK. Mildly dil LA; b. 05/2018 Echo: EF 25-30%, diff HK, Gr2 DD. Triv AI. Mod MR. Mildly reduced RV fxn. Mod-Sev TR. PASP 45-41mHg.  .Marland KitchenHypertension   . Hypothyroidism   . IBS (irritable bowel syndrome)   . Insomnia   . Migraines   . NICM (nonischemic cardiomyopathy) ([place]    a. EF prev 25%; b. 11/2014 s/p SJM Fortify Assura, single  lead AICD (ser# [id number]; c. 06/2017 Echo: EF 20-25%; d. 05/2018 Echo: EF 25-30%, diff HK, Gr2 DD; e. 05/2018 Echo: EF 25-30%; f. 05/2018 Cath: Nl cors. LVEDP 260mg, PCWP 3240m. PA 65/40 (52). CO/CI 3.04/1.56; c. 07/2018 CPX: Mod HF limitation.  . OSA (obstructive sleep apnea)    CPAP is broken  . PTSD (post-traumatic stress disorder)   . Restless leg syndrome   . Vertigo     Past Surgical History:  Procedure Laterality Date  . ABDOMINAL HYSTERECTOMY    . CARDIAC CATHETERIZATION    . CATARACT EXTRACTION W/PHACO Right 01/27/2019   Procedure: CATARACT EXTRACTION PHACO AND INTRAOCULAR LENS PLACEMENT (IOCSenecaVISION BLUE RIGHT DIABETES;  Surgeon: PorBirder RobsonD;  [facility];  Laterality: Right;  Diabetes - insulin sleep apnea  . CATARACT EXTRACTION W/PHACO Left 03/08/2020   Procedure: CATARACT EXTRACTION PHACO AND INTRAOCULAR LENS PLACEMENT (IOC) LEFT DIABETIC 10.28  00:57.8;  Surgeon: PorBirder RobsonD;  [place] [facility] [facility];  Laterality: Left;  . COLONOSCOPY WITH PROPOFOL N/A 03/11/2019   Procedure: COLONOSCOPY WITH PROPOFOL;  Surgeon: TahVirgel ManifoldD;  Location: [facility] ENDOSCOPY;  Service: Endoscopy;  Laterality: N/A;  . CORONARY ARTERY BYPASS GRAFT     Pt denies  . ESOPHAGOGASTRODUODENOSCOPY (EGD) WITH PROPOFOL N/A 03/11/2019   Procedure: ESOPHAGOGASTRODUODENOSCOPY (EGD) WITH PROPOFOL;  Surgeon: TahVirgel ManifoldD;  Location: [facility] ENDOSCOPY;  Service: Endoscopy;  Laterality: N/A;  . INSERT / REPLACE / REMOVE PACEMAKER     ICD  . INSERTION OF ICD  2016   St Jude Single chamber ICD  . RIGHT/LEFT HEART CATH AND CORONARY ANGIOGRAPHY N/A 06/09/2018   Procedure: RIGHT/LEFT HEART CATH AND CORONARY ANGIOGRAPHY;  Surgeon: AriWellington HampshireD;  [facility];  Service: Cardiovascular;  Laterality: N/A;  . TONSILLECTOMY    . TUBAL LIGATION  1980    Family Psychiatric History: I  have reviewed family psychiatric history from my progress note on 10/16/2017.  Family History:  Family History  Problem Relation Age of Onset  . Hypertension Mother   . Hyperlipidemia Mother   . Heart disease Mother   . Hypertension Sister   . Asthma Sister   . Heart disease Sister   . Diabetes Sister   . Cancer Sister   . Alzheimer's disease Maternal Grandfather   . Hyperlipidemia Brother   . Asthma Sister   . Hypertension Sister   . Diabetes Sister   . Breast cancer Paternal Aunt     Social History: I have reviewed social history from my progress note on 10/16/2017. Social History   Socioeconomic History  . Marital status: Married    Spouse name: Not on file  . Number of children: 2  . Years of education: Not on file  . Highest education level: High school graduate  Occupational History    Comment: disabled  Tobacco Use  .  Smoking status: Former Smoker    Types: Cigarettes    Quit date: 1985    Years since quitting: 37.1  . Smokeless tobacco: Never Used  . Tobacco comment: quit over  20 years ago   Vaping Use  . Vaping Use: Never used  Substance and Sexual Activity  . Alcohol use: Not Currently  . Drug use: Yes    Frequency: 1.0 times per week    Types: Marijuana    Comment: occassionally  . Sexual activity: Yes    Birth control/protection: Surgical    Comment: one partner  Other Topics Concern  . Not on file  Social History Narrative   Volunteers occasionally    Social Determinants of Health   Financial Resource Strain: Low Risk   . Difficulty of Paying Living Expenses: Not hard at all  Food Insecurity: Food Insecurity Present  . Worried About Charity fundraiser in the Last Year: Sometimes true  . Ran Out of Food in the Last Year: Sometimes true  Transportation Needs: No Transportation Needs  . Lack of Transportation (Medical): No  . Lack of Transportation (Non-Medical): No  Physical Activity: Insufficiently Active  . Days of Exercise per Week: 3 days   . Minutes of Exercise per Session: 30 min  Stress: No Stress Concern Present  . Feeling of Stress : Not at all  Social Connections: Not on file    Allergies:  Allergies  Allergen Reactions  . Levothyroxine Rash    Metabolic Disorder Labs: Lab Results  Component Value Date   HGBA1C 6.8 07/11/2020   MPG 185.77 05/08/2018   MPG >398 07/03/2017   No results found for: PROLACTIN Lab Results  Component Value Date   CHOL 152 07/11/2020   TRIG 168 (H) 07/11/2020   HDL 39 (L) 07/11/2020   CHOLHDL 5.4 05/09/2018   VLDL 53 (H) 05/09/2018   LDLCALC 84 07/11/2020   LDLCALC 121 (H) 09/28/2019   Lab Results  Component Value Date   TSH 1.290 03/02/2020   TSH 1.420 09/28/2019    Therapeutic Level Labs: No results found for: LITHIUM No results found for: VALPROATE No components found for:  CBMZ  Current Medications: Current Outpatient Medications  Medication Sig Dispense Refill  . risperiDONE (RISPERDAL) 0.25 MG tablet Take 1 tablet (0.25 mg total) by mouth every morning. 30 tablet 1  . blood glucose meter kit and supplies KIT Dispense based on patient and insurance preference. Use up to four times daily as directed. (FOR ICD-9 250.00, 250.01). 1 each 0  . carvedilol (COREG) 12.5 MG tablet Take 12.5 mg by mouth 2 (two) times daily with a meal.    . cyclobenzaprine (FLEXERIL) 5 MG tablet TAKE 1 TABLET BY MOUTH THREE TIMES A DAY AS NEEDED FOR MUSCLE SPASMS 60 tablet 1  . digoxin (LANOXIN) 0.125 MG tablet TAKE 1 TABLET BY MOUTH DAILY 90 tablet 3  . Dulaglutide (TRULICITY) 4.5 OF/7.5ZW SOPN Inject 4.5 mg as directed once a week. 6 mL 1  . empagliflozin (JARDIANCE) 25 MG TABS tablet Take 1 tablet (25 mg total) by mouth daily. 90 tablet 1  . famotidine (PEPCID) 40 MG tablet TAKE 1 TABLET BY MOUTH EVERY DAY IN THE EVENING 90 tablet 3  . furosemide (LASIX) 20 MG tablet Take 1 tablet (20 mg total) by mouth as directed. Take 1m (1 tablet) Monday, Wednesday, Friday. 12 tablet 2  . insulin  glargine (LANTUS SOLOSTAR) 100 UNIT/ML Solostar Pen Inject 35 Units into the skin daily at 10 pm.  15 mL 3  . Insulin Pen Needle 32G X 6 MM MISC 1 each by Does not apply route daily. 100 each 12  . KLOR-CON M20 20 MEQ tablet TAKE 2 TABLETS (40 MEQ TOTAL) BY MOUTH 2 (TWO) TIMES DAILY. 360 tablet 1  . lamoTRIgine (LAMICTAL) 150 MG tablet Take 1 tablet (150 mg total) by mouth daily. 90 tablet 0  . metoCLOPramide (REGLAN) 10 MG tablet TAKE 1 TABLET (10 MG TOTAL) BY MOUTH 3 (THREE) TIMES DAILY WITH MEALS. 90 tablet 1  . pantoprazole (PROTONIX) 40 MG tablet TAKE 1 TABLET BY MOUTH EVERY DAY 90 tablet 1  . PARoxetine (PAXIL) 30 MG tablet Take 1 tablet (30 mg total) by mouth daily. 90 tablet 1  . risperiDONE (RISPERDAL) 2 MG tablet Take 1 tablet (2 mg total) by mouth at bedtime. 90 tablet 0  . rosuvastatin (CRESTOR) 40 MG tablet Take 1 tablet (40 mg total) by mouth daily. 90 tablet 1  . sacubitril-valsartan (ENTRESTO) 49-51 MG Take 1 tablet by mouth 2 (two) times daily. 60 tablet 3  . spironolactone (ALDACTONE) 25 MG tablet Take 25 mg by mouth daily.    . sucralfate (CARAFATE) 1 g tablet TAKE 1 TABLET (1 G TOTAL) BY MOUTH 4 (FOUR) TIMES DAILY. 360 tablet 3  . Vitamin D, Ergocalciferol, (DRISDOL) 1.25 MG (50000 UNIT) CAPS capsule TAKE 1 CAPSULE (50,000 UNITS TOTAL) BY MOUTH EVERY 7 (SEVEN) DAYS (Patient not taking: Reported on 09/09/2020) 4 capsule 0  . zolpidem (AMBIEN) 5 MG tablet Take 1 tablet (5 mg total) by mouth at bedtime as needed for sleep. 30 tablet 0   No current facility-administered medications for this visit.     Musculoskeletal: Strength & Muscle Tone: UTA Gait & Station: UTA Patient leans: N/A  Psychiatric Specialty Exam: Review of Systems  Psychiatric/Behavioral:       Irritable  All other systems reviewed and are negative.   There were no vitals taken for this visit.There is no height or weight on file to calculate BMI.  General Appearance: UTA  Eye Contact:  UTA  Speech:   Clear and Coherent  Volume:  Normal  Mood:  Irritable  Affect:  UTA  Thought Process:  Goal Directed and Descriptions of Associations: Intact  Orientation:  Full (Time, Place, and Person)  Thought Content: Logical   Suicidal Thoughts:  No  Homicidal Thoughts:  No  Memory:  Immediate;   Fair Recent;   Fair Remote;   Fair  Judgement:  Fair  Insight:  Shallow  Psychomotor Activity:  UTA  Concentration:  Concentration: Fair and Attention Span: Fair  Recall:  [employer] of Knowledge: Fair  Language: Fair  Akathisia:  No  Handed:  Right  AIMS (if indicated): UTA  Assets:  Communication Skills Desire for Improvement Housing Social Support  ADL's:  Intact  Cognition: WNL  Sleep:  Fair   Screenings: GAD-7   Flowsheet Row Office Visit from 12/28/2016 in [place]  Total GAD-7 Score 21    PHQ2-9   [place] from 09/09/2020 in [place] Visit from 07/11/2020 in [place] Visit from 09/28/2019 in [place] from [facility] Patient Outreach Telephone from 06/16/2018 in [place]  PHQ-2 Total Score 0 3 0 1 0  PHQ-9 Total Score - 6 1 - -       Assessment and Plan: Bonnie Ochoa is a 55 year old African-American female who has a history  of bipolar disorder, PTSD, panic attacks, cannabis use disorder, congestive heart failure, liver disease, chronic renal failure, diabetes melitis, RLS, multiple medical problems was evaluated by telemedicine today.  Patient is currently struggling with irritability.  She will benefit from the following plan.  Plan Bipolar disorder depressed-unstable Increase risperidone to 0.25 mg p.o. daily in the morning Risperidone 2 mg p.o. nightly Lamotrigine 150 mg p.o. daily Paxil 30 mg p.o. daily  PTSD-stable Paxil as prescribed  Panic disorder-stable Paxil as prescribed  Insomnia-stable Ambien 5 mg p.o.  nightly as needed Continue CPAP for OSA  Cannabis use disorder-unstable Provided counseling  Follow-up in clinic in 3 to 4 weeks or sooner if needed.  Discussed with patient it is recommended to have a video visit next time. If she continues to have problems with correction, will recommend in person office visit.  I have spent atleast 20 minutes non face to face with patient today. More than 50 % of the time was spent for preparing to see the patient ( e.g., review of test, records ), ordering medications and test ,psychoeducation and supportive psychotherapy and care coordination,as well as documenting clinical information in electronic health record. This note was generated in part or whole with voice recognition software. Voice recognition is usually quite accurate but there are transcription errors that can and very often do occur. I apologize for any typographical errors that were not detected and corrected.      [name], MD 09/16/2020, 8:40 AM

## 2020-09-16 DIAGNOSIS — E119 Type 2 diabetes mellitus without complications: Secondary | ICD-10-CM | POA: Diagnosis not present

## 2020-09-22 ENCOUNTER — Telehealth: Payer: Self-pay | Admitting: Family Medicine

## 2020-09-22 NOTE — Telephone Encounter (Signed)
   Telephone encounter was:  Successful.  09/22/2020 Name: Bonnie Ochoa MRN: [id number] DOB: [birth year]  Bonnie Ochoa is a 55 y.o. year old female who is a primary care patient of Dorcas Carrow, DO . The community resource team was consulted for assistance with Food Insecurity  Care guide performed the following interventions: Follow up call placed to the patient to discuss status of referral Patient got her $200 gift card from [facility] and got the email with the listing of the food banks in her area. .  Follow Up Plan:  No further follow up planned at this time. The patient has been provided with needed resources. Rojelio Brenner Care Guide, Embedded Care Coordination [facility], Care Management Phone: [phone] Email: [email or web address]

## 2020-09-29 ENCOUNTER — Encounter (HOSPITAL_COMMUNITY): Payer: Medicare Other

## 2020-10-01 ENCOUNTER — Other Ambulatory Visit: Payer: Self-pay | Admitting: Psychiatry

## 2020-10-01 DIAGNOSIS — F5105 Insomnia due to other mental disorder: Secondary | ICD-10-CM

## 2020-10-10 ENCOUNTER — Other Ambulatory Visit: Payer: Self-pay | Admitting: Psychiatry

## 2020-10-10 DIAGNOSIS — F3131 Bipolar disorder, current episode depressed, mild: Secondary | ICD-10-CM

## 2020-10-10 NOTE — Progress Notes (Signed)
Cardiology Office Note  Date:  10/11/2020   ID:  Bonnie Ochoa, DOB Jun 25, 1966, MRN [id number]  PCP:  Bonnie Roys, DO   Chief Complaint  Patient presents with  . Other    6 month f/u no complaints today. Meds reviewed verbally with pt.    HPI:  [name] is a 55 yo woman with PMH Medical and appt noncompliance, Substance abuse-chronic /marijuana bipolar, anxiety diabetes, hypertension,  asthma,  Obstructive sleep apnea, restless leg syndrome Nonischemic cardiomyopathy, history of ICD,  ejection fraction 25%, 05/2018 Normal coronary artery, (patient denies any coronary disease on prior cardiac catheterization) anemia Previous alcohol problem  ascites, cirrhosis, History of paracentesis Previously on pain medication at home, oxycodone She presents today for follow-up of her chronic systolic CHF  Visit with Christell Faith in 5/21 and Entresto decreased to 24/26 bid due to low BP Echo 7/21 EF 20-25% RV ok.  Followed in the advanced heart failure clinic, seen 2 months ago - Stable NYHA III-IIIb  LOV 9/21 Weight down 35 pounds from 2019  Labs reviewed HGBA1C 12.7 to 6.8  Total chol 303 to 152 LDL 84  ECHO 07/2020 Ef 25 to 30-%  No significant leg swelling, no abdominal bloating, does not feel she has extra fluid  Active, walking ,>10 minutes endurance  meds as listed below spironolactone to 25 mg daily   carvedilol 12.5 mg twice a day.  digoxin 0.125 mg daily.  entresto 49/51 bid mg twice a day.  EKG personally reviewed by myself on todays visit Shows normal sinus rhythm rate 95 bpm nonspecific T wave abnormality V6, 1 aVL Unchanged  Other past medical history reviewed hospitalization October 25, 2018 for renal failure CR 2.15 Felt to be secondary to dehydration and improved with IV fluids She was continued on Lasix at discharge  Other past medical history reviewed Previous hospitalization November 2018 for hyperglycemia nonketotic coma She had  acute renal failure and hyponatremia  Hospital admission 09/10/16 numerous sx on arrival to ER chest pain, shortness of breath, abdominal pain, dysuria, urinary frequency, syncope 2 today with lightheadedness, nausea and vomiting, and diarrhea , sob TBili 2.4, had paracentesis, diuresis D/c on lasix 60 BID  Hospital admission 09/17/16: chest pain, ABD pain fevers, chills, chest pain, shortness of breath, vomiting and diarrhea. "ran out of meds" HTN, acute on chronic systolic CHF  Seen in the emergency room 05/19/2016 for abdominal pain, chest pain BNP in the hospital 3700 acute on chronic CHF, Had 20 L diuresis  CT scan consistent with moderate abdominal ascites, fatty liver Aortic atherosclerosis   PMH:   has a past medical history of Abdominal pain, ADHD, AICD (automatic cardioverter/defibrillator) present, Anemia, Arthritis, Asthma, Bipolar 1 disorder ([place]), Chest pain, CHF (congestive heart failure) ([place]), Cirrhosis of liver ([place]), Coronary artery disease, Depression, Diabetes mellitus without complication ([place]), Diverticulitis, Dysrhythmia, Heart murmur, HFrEF (heart failure with reduced ejection fraction) ([place]), Hypertension, Hypothyroidism, IBS (irritable bowel syndrome), Insomnia, Migraines, NICM (nonischemic cardiomyopathy) ([place]), OSA (obstructive sleep apnea), PTSD (post-traumatic stress disorder), Restless leg syndrome, and Vertigo.  PSH:    Past Surgical History:  Procedure Laterality Date  . ABDOMINAL HYSTERECTOMY    . CARDIAC CATHETERIZATION    . CATARACT EXTRACTION W/PHACO Right 01/27/2019   Procedure: CATARACT EXTRACTION PHACO AND INTRAOCULAR LENS PLACEMENT ([place])  VISION BLUE RIGHT DIABETES;  Surgeon: Birder Robson, MD;  Location: [facility];  Service: Ophthalmology;  Laterality: Right;  Diabetes - insulin sleep apnea  . CATARACT EXTRACTION W/PHACO Left 03/08/2020  Procedure: CATARACT EXTRACTION PHACO AND INTRAOCULAR LENS PLACEMENT ([facility]) LEFT DIABETIC  10.28  00:57.8;  Surgeon: Birder Robson, MD;  Location: [facility];  Service: Ophthalmology;  Laterality: Left;  . COLONOSCOPY WITH PROPOFOL N/A 03/11/2019   Procedure: COLONOSCOPY WITH PROPOFOL;  Surgeon: Virgel Manifold, MD;  Location: [facility];  Service: Endoscopy;  Laterality: N/A;  . CORONARY ARTERY BYPASS GRAFT     Pt denies  . ESOPHAGOGASTRODUODENOSCOPY (EGD) WITH PROPOFOL N/A 03/11/2019   Procedure: ESOPHAGOGASTRODUODENOSCOPY (EGD) WITH PROPOFOL;  Surgeon: Virgel Manifold, MD;  Location: [facility];  Service: Endoscopy;  Laterality: N/A;  . INSERT / REPLACE / REMOVE PACEMAKER     ICD  . INSERTION OF ICD  2016   St Jude Single chamber ICD  . RIGHT/LEFT HEART CATH AND CORONARY ANGIOGRAPHY N/A 06/09/2018   Procedure: RIGHT/LEFT HEART CATH AND CORONARY ANGIOGRAPHY;  Surgeon: Wellington Hampshire, MD;  Location: [facility];  Service: Cardiovascular;  Laterality: N/A;  . TONSILLECTOMY    . TUBAL LIGATION  1980    Current Outpatient Medications  Medication Sig Dispense Refill  . blood glucose meter kit and supplies KIT Dispense based on patient and insurance preference. Use up to four times daily as directed. (FOR ICD-9 250.00, 250.01). 1 each 0  . carvedilol (COREG) 12.5 MG tablet Take 12.5 mg by mouth 2 (two) times daily with a meal.    . cyclobenzaprine (FLEXERIL) 5 MG tablet TAKE 1 TABLET BY MOUTH THREE TIMES A DAY AS NEEDED FOR MUSCLE SPASMS 60 tablet 1  . digoxin (LANOXIN) 0.125 MG tablet TAKE 1 TABLET BY MOUTH DAILY 90 tablet 3  . empagliflozin (JARDIANCE) 25 MG TABS tablet Take 1 tablet (25 mg total) by mouth daily. 90 tablet 1  . famotidine (PEPCID) 40 MG tablet TAKE 1 TABLET BY MOUTH EVERY DAY IN THE EVENING 90 tablet 3  . furosemide (LASIX) 20 MG tablet Take 1 tablet (20 mg total) by mouth as directed. Take 33m (1 tablet) Monday, Wednesday, Friday. 12 tablet 2  . insulin glargine (LANTUS SOLOSTAR) 100 UNIT/ML Solostar Pen Inject 35 Units  into the skin daily at 10 pm. 15 mL 3  . Insulin Pen Needle 32G X 6 MM MISC 1 each by Does not apply route daily. 100 each 12  . KLOR-CON M20 20 MEQ tablet TAKE 2 TABLETS (40 MEQ TOTAL) BY MOUTH 2 (TWO) TIMES DAILY. 360 tablet 1  . lamoTRIgine (LAMICTAL) 150 MG tablet Take 1 tablet (150 mg total) by mouth daily. 90 tablet 0  . pantoprazole (PROTONIX) 40 MG tablet TAKE 1 TABLET BY MOUTH EVERY DAY 90 tablet 1  . PARoxetine (PAXIL) 30 MG tablet Take 1 tablet (30 mg total) by mouth daily. 90 tablet 1  . risperiDONE (RISPERDAL) 0.25 MG tablet TAKE 1 TABLET (0.25 MG TOTAL) BY MOUTH EVERY MORNING. 90 tablet 1  . risperiDONE (RISPERDAL) 2 MG tablet Take 1 tablet (2 mg total) by mouth at bedtime. 90 tablet 0  . rosuvastatin (CRESTOR) 40 MG tablet Take 1 tablet (40 mg total) by mouth daily. 90 tablet 1  . sacubitril-valsartan (ENTRESTO) 49-51 MG Take 1 tablet by mouth 2 (two) times daily. 60 tablet 3  . spironolactone (ALDACTONE) 25 MG tablet Take 25 mg by mouth daily.    . sucralfate (CARAFATE) 1 g tablet TAKE 1 TABLET (1 G TOTAL) BY MOUTH 4 (FOUR) TIMES DAILY. 360 tablet 3  . zolpidem (AMBIEN) 10 MG tablet Take 1 tablet (10 mg total) by mouth at  bedtime as needed for sleep. 21 tablet 0  . metoCLOPramide (REGLAN) 10 MG tablet TAKE 1 TABLET (10 MG TOTAL) BY MOUTH 3 (THREE) TIMES DAILY WITH MEALS. 90 tablet 1   No current facility-administered medications for this visit.     Allergies:   Levothyroxine   Social History:  The patient  reports that she quit smoking about 37 years ago. Her smoking use included cigarettes. She has never used smokeless tobacco. She reports previous alcohol use. She reports current drug use. Frequency: 1.00 time per week. Drug: Marijuana.   Family History:   family history includes Alzheimer's disease in her maternal grandfather; Asthma in her sister and sister; Breast cancer in her paternal aunt; Cancer in her sister; Diabetes in her sister and sister; Heart disease in her  mother and sister; Hyperlipidemia in her brother and mother; Hypertension in her mother, sister, and sister.    Review of Systems: Review of Systems  Constitutional: Negative.   HENT: Negative.   Respiratory: Negative.   Cardiovascular: Negative.   Gastrointestinal: Negative.   Musculoskeletal: Negative.   Neurological: Negative.   Psychiatric/Behavioral: Negative.   All other systems reviewed and are negative.   PHYSICAL EXAM: VS:  BP 110/80 (BP Location: Left Arm, Patient Position: Sitting, Cuff Size: Normal)   Pulse 94   Ht 5' 5"  (1.651 m)   Wt 176 lb 8 oz (80.1 kg)   SpO2 98%   BMI 29.37 kg/m  , BMI Body mass index is 29.37 kg/m.  Constitutional:  oriented to person, place, and time. No distress.  HENT:  Head: Grossly normal Eyes:  no discharge. No scleral icterus.  Neck: No JVD, no carotid bruits  Cardiovascular: Regular rate and rhythm, no murmurs appreciated Pulmonary/Chest: Clear to auscultation bilaterally, no wheezes or rails Abdominal: Soft.  no distension.  no tenderness.  Musculoskeletal: Normal range of motion Neurological:  normal muscle tone. Coordination normal. No atrophy Skin: Skin warm and dry Psychiatric: normal affect, pleasant   Recent Labs: 03/02/2020: Hemoglobin 12.8; Platelets 257; TSH 1.290 07/11/2020: ALT 18; BUN 14; Creatinine, Ser 1.17; Potassium 4.2; Sodium 142    Lipid Panel Lab Results  Component Value Date   CHOL 152 07/11/2020   HDL 39 (L) 07/11/2020   LDLCALC 84 07/11/2020   TRIG 168 (H) 07/11/2020      Wt Readings from Last 3 Encounters:  10/11/20 176 lb 8 oz (80.1 kg)  09/09/20 174 lb (78.9 kg)  07/11/20 182 lb (82.6 kg)     ASSESSMENT AND PLAN:  Problem List Items Addressed This Visit      Cardiology Problems   Chronic systolic heart failure (HCC) - Primary (Chronic)   NICM (nonischemic cardiomyopathy) ([place])    Other Visit Diagnoses    ICD (implantable cardioverter-defibrillator) in place       OSA  (obstructive sleep apnea)       Essential hypertension       CKD (chronic kidney disease), stage II         Nonischemic cardiomyopathy EF with improvement 25 to 30% Medication compliant here Continue current meds, BP low, will not increase the doses Dig level borderline elevated, stable past 2 checks, will continue for now  Diabetes type 2 with complications Hemoglobin A1c dramatically improved, now <7  Bipolar 1 disorder/PTSD Followed by PMD Doing well   Cirrhosis Followed by GI,  As on previous visits we have recommended alcohol cessation    Total encounter time more than 25 minutes  Greater than 50% was  spent in counseling and coordination of care with the patient    Signed, Esmond Plants, M.D., Ph.D. [place], [place]

## 2020-10-11 ENCOUNTER — Telehealth (INDEPENDENT_AMBULATORY_CARE_PROVIDER_SITE_OTHER): Payer: Medicare Other | Admitting: Psychiatry

## 2020-10-11 ENCOUNTER — Encounter: Payer: Self-pay | Admitting: Psychiatry

## 2020-10-11 ENCOUNTER — Other Ambulatory Visit: Payer: Self-pay

## 2020-10-11 ENCOUNTER — Ambulatory Visit (INDEPENDENT_AMBULATORY_CARE_PROVIDER_SITE_OTHER): Payer: Medicare Other | Admitting: Cardiovascular Disease

## 2020-10-11 VITALS — BP 110/80 | HR 94 | Ht 65.0 in | Wt 176.5 lb

## 2020-10-11 DIAGNOSIS — F5105 Insomnia due to other mental disorder: Secondary | ICD-10-CM

## 2020-10-11 DIAGNOSIS — F410 Panic disorder [episodic paroxysmal anxiety] without agoraphobia: Secondary | ICD-10-CM | POA: Diagnosis not present

## 2020-10-11 DIAGNOSIS — I5022 Chronic systolic (congestive) heart failure: Secondary | ICD-10-CM

## 2020-10-11 DIAGNOSIS — G4733 Obstructive sleep apnea (adult) (pediatric): Secondary | ICD-10-CM | POA: Diagnosis not present

## 2020-10-11 DIAGNOSIS — I428 Other cardiomyopathies: Secondary | ICD-10-CM | POA: Diagnosis not present

## 2020-10-11 DIAGNOSIS — F4312 Post-traumatic stress disorder, chronic: Secondary | ICD-10-CM

## 2020-10-11 DIAGNOSIS — I10 Essential (primary) hypertension: Secondary | ICD-10-CM

## 2020-10-11 DIAGNOSIS — Z95810 Presence of automatic (implantable) cardiac defibrillator: Secondary | ICD-10-CM

## 2020-10-11 DIAGNOSIS — F1210 Cannabis abuse, uncomplicated: Secondary | ICD-10-CM | POA: Diagnosis not present

## 2020-10-11 DIAGNOSIS — F3131 Bipolar disorder, current episode depressed, mild: Secondary | ICD-10-CM | POA: Diagnosis not present

## 2020-10-11 DIAGNOSIS — N182 Chronic kidney disease, stage 2 (mild): Secondary | ICD-10-CM

## 2020-10-11 MED ORDER — SPIRONOLACTONE 25 MG PO TABS
25.0000 mg | ORAL_TABLET | Freq: Every day | ORAL | 3 refills | Status: DC
Start: 2020-10-11 — End: 2021-10-02

## 2020-10-11 MED ORDER — PAROXETINE HCL 30 MG PO TABS: 30.0000 mg | ORAL_TABLET | Freq: Every day | ORAL | 1 refills | Status: DC

## 2020-10-11 MED ORDER — CARVEDILOL 12.5 MG PO TABS
12.5000 mg | ORAL_TABLET | Freq: Two times a day (BID) | ORAL | 3 refills | Status: DC
Start: 2020-10-11 — End: 2021-10-02

## 2020-10-11 MED ORDER — POTASSIUM CHLORIDE CRYS ER 20 MEQ PO TBCR
EXTENDED_RELEASE_TABLET | ORAL | 3 refills | Status: DC
Start: 2020-10-11 — End: 2021-07-19

## 2020-10-11 MED ORDER — ROSUVASTATIN CALCIUM 40 MG PO TABS
40.0000 mg | ORAL_TABLET | Freq: Every day | ORAL | 3 refills | Status: DC
Start: 2020-10-11 — End: 2021-11-30

## 2020-10-11 MED ORDER — FUROSEMIDE 20 MG PO TABS
20.0000 mg | ORAL_TABLET | ORAL | 3 refills | Status: DC
Start: 2020-10-11 — End: 2021-10-31

## 2020-10-11 MED ORDER — DIGOXIN 125 MCG PO TABS
ORAL_TABLET | ORAL | 3 refills | Status: DC
Start: 2020-10-11 — End: 2021-12-19

## 2020-10-11 MED ORDER — ENTRESTO 49-51 MG PO TABS
1.0000 | ORAL_TABLET | Freq: Two times a day (BID) | ORAL | 11 refills | Status: DC
Start: 2020-10-11 — End: 2020-10-13

## 2020-10-11 MED ORDER — ZOLPIDEM TARTRATE 10 MG PO TABS: 10.0000 mg | ORAL_TABLET | Freq: Every evening | ORAL | 0 refills | Status: DC | PRN

## 2020-10-11 NOTE — Progress Notes (Signed)
Virtual Visit via Telephone Note  I connected with [name] on 10/11/20 at 11:40 AM EST by telephone and verified that I am speaking with the correct person using two identifiers.  Location Provider Location : [facility] Patient Location : Home  Participants: Patient , Provider   I discussed the limitations, risks, security and privacy concerns of performing an evaluation and management service by telephone and the availability of in person appointments. I also discussed with the patient that there may be a patient responsible charge related to this service. The patient expressed understanding and agreed to proceed.   I discussed the assessment and treatment plan with the patient. The patient was provided an opportunity to ask questions and all were answered. The patient agreed with the plan and demonstrated an understanding of the instructions.   The patient was advised to call back or seek an in-person evaluation if the symptoms worsen or if the condition fails to improve as anticipated.  Bonnie Ochoa Progress Note  10/11/2020 9:36 PM Bonnie Ochoa  MRN:  [id number]  Chief Complaint:  Chief Complaint    Follow-up     HPI: Bonnie Ochoa is a 55 year old African-American female, engaged, lives in [place], has a history of PTSD, bipolar disorder, cannabis use disorder, insomnia, OSA on CPAP, RLS, diabetes, cirrhosis of liver, chronic renal failure, congestive heart failure, hypertension, was evaluated by telemedicine today.  Patient reports she has not been able to sleep since the past 2 weeks.  Patient reports she however wants to stay on the Ambien because that has been very beneficial in the past.  She denies any recent psychosocial stressors which could be contributing to her sleep problems.  She reports since increasing the dose of risperidone she has been doing well with regards to her irritability and mood swings.  She denies any depression, manic or hypomanic symptoms at this  time.  She continues to use cannabis.  She however reports she is willing to quit using cannabis.  Provided counseling.  Patient denies any suicidality, homicidality or perceptual disturbances.  Patient denies any other concerns today.   Visit Diagnosis:    ICD-10-CM   1. Bipolar 1 disorder, depressed, mild ([place])  F31.31   2. Chronic post-traumatic stress disorder (PTSD)  F43.12 PARoxetine (PAXIL) 30 MG tablet  3. Panic disorder  F41.0 PARoxetine (PAXIL) 30 MG tablet  4. Cannabis use disorder, mild, abuse  F12.10   5. Insomnia due to mental condition  F51.05 zolpidem (AMBIEN) 10 MG tablet    Past Psychiatric History: I have reviewed past psychiatric history from my progress note on 10/16/2017  Past Medical History:  Past Medical History:  Diagnosis Date  . Abdominal pain    a. 05/2018 HIDA scan wnl.  . ADHD   . AICD (automatic cardioverter/defibrillator) present    a. 11/2014 s/p SJM Fortify Assura, single lead AICD (ser# [id number]).  Marland Kitchen Anemia   . Arthritis   . Asthma   . Bipolar 1 disorder ([place])   . Chest pain    a. Hx of cath in [place] - reportedly nl; b. 04/2018 MV: EF 22%, fixed dist ant septal, apical, and inferoapical defects - ? scar vs. attenuation. No ischemia.  . CHF (congestive heart failure) ([place])   . Cirrhosis of liver ([place])   . Coronary artery disease   . Depression   . Diabetes mellitus without complication ([place])   . Diverticulitis   . Dysrhythmia   . Heart murmur   . HFrEF (heart failure with  reduced ejection fraction) ([place])    a. 06/2017 Echo: EF 20-25%, diff HK. Mildly dil LA; b. 05/2018 Echo: EF 25-30%, diff HK, Gr2 DD. Triv AI. Mod MR. Mildly reduced RV fxn. Mod-Sev TR. PASP 45-86mHg.  .Marland KitchenHypertension   . Hypothyroidism   . IBS (irritable bowel syndrome)   . Insomnia   . Migraines   . NICM ([place]ischemic cardiomyopathy) ([place]    a. EF prev 25%; b. 11/2014 s/p SJM Fortify Assura, single lead AICD (ser# [id number]; c. 06/2017 Echo: EF 20-25%; d. 05/2018 Echo: EF  25-30%, diff HK, Gr2 DD; e. 05/2018 Echo: EF 25-30%; f. 05/2018 Cath: Nl cors. LVEDP 275mg, PCWP 3270m. PA 65/40 (52). CO/CI 3.04/1.56; c. 07/2018 CPX: Mod HF limitation.  . OSA (obstructive sleep apnea)    CPAP is broken  . PTSD (post-traumatic stress disorder)   . Restless leg syndrome   . Vertigo     Past Surgical History:  Procedure Laterality Date  . ABDOMINAL HYSTERECTOMY    . CARDIAC CATHETERIZATION    . CATARACT EXTRACTION W/PHACO Right 01/27/2019   Procedure: CATARACT EXTRACTION PHACO AND INTRAOCULAR LENS PLACEMENT ([place] Lake[place] BLUE RIGHT DIABETES;  Surgeon: PorBirder RobsonD;  [facility];  Laterality: Right;  Diabetes - insulin sleep apnea  . CATARACT EXTRACTION W/PHACO Left 03/08/2020   Procedure: CATARACT EXTRACTION PHACO AND INTRAOCULAR LENS PLACEMENT (IOC) LEFT DIABETIC 10.28  00:57.8;  Surgeon: PorBirder RobsonD;  [facility];  Laterality: Left;  . COLONOSCOPY WITH PROPOFOL N/A 03/11/2019   Procedure: COLONOSCOPY WITH PROPOFOL;  Surgeon: TahVirgel ManifoldD;  Location: [facility];  Service: Endoscopy;  Laterality: N/A;  . CORONARY ARTERY BYPASS GRAFT     Pt denies  . ESOPHAGOGASTRODUODENOSCOPY (EGD) WITH PROPOFOL N/A 03/11/2019   Procedure: ESOPHAGOGASTRODUODENOSCOPY (EGD) WITH PROPOFOL;  Surgeon: TahVirgel ManifoldD;  Location: [facility] ENDOSCOPY;  Service: Endoscopy;  Laterality: N/A;  . INSERT / REPLACE / REMOVE PACEMAKER     ICD  . INSERTION OF ICD  2016   St Jude Single chamber ICD  . RIGHT/LEFT HEART CATH AND CORONARY ANGIOGRAPHY N/A 06/09/2018   Procedure: RIGHT/LEFT HEART CATH AND CORONARY ANGIOGRAPHY;  Surgeon: AriWellington HampshireD;  [facility];  Service: Cardiovascular;  Laterality: N/A;  . TONSILLECTOMY    . TUBAL LIGATION  1980    Family Psychiatric History: I have reviewed family psychiatric history from my progress note on  10/16/2017  Family History:  Family History  Problem Relation Age of Onset  . Hypertension Mother   . Hyperlipidemia Mother   . Heart disease Mother   . Hypertension Sister   . Asthma Sister   . Heart disease Sister   . Diabetes Sister   . Cancer Sister   . Alzheimer's disease Maternal Grandfather   . Hyperlipidemia Brother   . Asthma Sister   . Hypertension Sister   . Diabetes Sister   . Breast cancer Paternal Aunt     Social History: Reviewed social history from my progress note on 10/16/2017 Social History   Socioeconomic History  . Marital status: Married    Spouse name: Not on file  . Number of children: 2  . Years of education: Not on file  . Highest education level: High school graduate  Occupational History    Comment: disabled  Tobacco Use  . Smoking status: Former Smoker    Types: Cigarettes    Quit date: 1985  Years since quitting: 37.1  . Smokeless tobacco: Never Used  . Tobacco comment: quit over  20 years ago   Vaping Use  . Vaping Use: Never used  Substance and Sexual Activity  . Alcohol use: Not Currently  . Drug use: Yes    Frequency: 1.0 times per week    Types: Marijuana    Comment: occassionally  . Sexual activity: Yes    Birth control/protection: Surgical    Comment: one partner  Other Topics Concern  . Not on file  Social History Narrative   Volunteers occasionally    Social Determinants of Health   Financial Resource Strain: Low Risk   . Difficulty of Paying Living Expenses: Not hard at all  Food Insecurity: Food Insecurity Present  . Worried About Charity fundraiser in the Last Year: Sometimes true  . Ran Out of Food in the Last Year: Sometimes true  Transportation Needs: No Transportation Needs  . Lack of Transportation (Medical): No  . Lack of Transportation (Non-Medical): No  Physical Activity: Insufficiently Active  . Days of Exercise per Week: 3 days  . Minutes of Exercise per Session: 30 min  Stress: No Stress Concern  Present  . Feeling of Stress : Not at all  Social Connections: Not on file    Allergies:  Allergies  Allergen Reactions  . Levothyroxine Rash    Metabolic Disorder Labs: Lab Results  Component Value Date   HGBA1C 6.8 07/11/2020   MPG 185.77 05/08/2018   MPG >398 07/03/2017   No results found for: PROLACTIN Lab Results  Component Value Date   CHOL 152 07/11/2020   TRIG 168 (H) 07/11/2020   HDL 39 (L) 07/11/2020   CHOLHDL 5.4 05/09/2018   VLDL 53 (H) 05/09/2018   LDLCALC 84 07/11/2020   LDLCALC 121 (H) 09/28/2019   Lab Results  Component Value Date   TSH 1.290 03/02/2020   TSH 1.420 09/28/2019    Therapeutic Level Labs: No results found for: LITHIUM No results found for: VALPROATE No components found for:  CBMZ  Current Medications: Current Outpatient Medications  Medication Sig Dispense Refill  . zolpidem (AMBIEN) 10 MG tablet Take 1 tablet (10 mg total) by mouth at bedtime as needed for sleep. 21 tablet 0  . blood glucose meter kit and supplies KIT Dispense based on patient and insurance preference. Use up to four times daily as directed. (FOR ICD-9 250.00, 250.01). 1 each 0  . carvedilol (COREG) 12.5 MG tablet Take 1 tablet (12.5 mg total) by mouth 2 (two) times daily with a meal. 180 tablet 3  . cyclobenzaprine (FLEXERIL) 5 MG tablet TAKE 1 TABLET BY MOUTH THREE TIMES A DAY AS NEEDED FOR MUSCLE SPASMS 60 tablet 1  . digoxin (LANOXIN) 0.125 MG tablet TAKE 1 TABLET BY MOUTH DAILY 90 tablet 3  . empagliflozin (JARDIANCE) 25 MG TABS tablet Take 1 tablet (25 mg total) by mouth daily. 90 tablet 1  . famotidine (PEPCID) 40 MG tablet TAKE 1 TABLET BY MOUTH EVERY DAY IN THE EVENING 90 tablet 3  . furosemide (LASIX) 20 MG tablet Take 1 tablet (20 mg total) by mouth as directed. Take 69m (1 tablet) Monday, Wednesday, Friday. 40 tablet 3  . insulin glargine (LANTUS SOLOSTAR) 100 UNIT/ML Solostar Pen Inject 35 Units into the skin daily at 10 pm. 15 mL 3  . Insulin Pen Needle  32G X 6 MM MISC 1 each by Does not apply route daily. 100 each 12  . lamoTRIgine (LAMICTAL)  150 MG tablet Take 1 tablet (150 mg total) by mouth daily. 90 tablet 0  . metoCLOPramide (REGLAN) 10 MG tablet TAKE 1 TABLET (10 MG TOTAL) BY MOUTH 3 (THREE) TIMES DAILY WITH MEALS. 90 tablet 1  . pantoprazole (PROTONIX) 40 MG tablet TAKE 1 TABLET BY MOUTH EVERY DAY 90 tablet 1  . PARoxetine (PAXIL) 30 MG tablet Take 1 tablet (30 mg total) by mouth daily. 90 tablet 1  . potassium chloride SA (KLOR-CON M20) 20 MEQ tablet TAKE 2 TABLETS (40 MEQ TOTAL) BY MOUTH 2 (TWO) TIMES DAILY. 180 tablet 3  . risperiDONE (RISPERDAL) 0.25 MG tablet TAKE 1 TABLET (0.25 MG TOTAL) BY MOUTH EVERY MORNING. 90 tablet 1  . risperiDONE (RISPERDAL) 2 MG tablet Take 1 tablet (2 mg total) by mouth at bedtime. 90 tablet 0  . rosuvastatin (CRESTOR) 40 MG tablet Take 1 tablet (40 mg total) by mouth daily. 90 tablet 3  . sacubitril-valsartan (ENTRESTO) 49-51 MG Take 1 tablet by mouth 2 (two) times daily. 60 tablet 11  . spironolactone (ALDACTONE) 25 MG tablet Take 1 tablet (25 mg total) by mouth daily. 90 tablet 3  . sucralfate (CARAFATE) 1 g tablet TAKE 1 TABLET (1 G TOTAL) BY MOUTH 4 (FOUR) TIMES DAILY. 360 tablet 3   No current facility-administered medications for this visit.     Musculoskeletal: Strength & Muscle Tone: UTA Gait & Station: UTA Patient leans: N/A  Psychiatric Specialty Exam: Review of Systems  Psychiatric/Behavioral: Positive for sleep disturbance.  All other systems reviewed and are negative.   There were no vitals taken for this visit.There is no height or weight on file to calculate BMI.  General Appearance: UTA  Eye Contact:  UTA  Speech:  Clear and Coherent  Volume:  Normal  Mood:  Euthymic  Affect:  UTA  Thought Process:  Goal Directed and Descriptions of Associations: Intact  Orientation:  Full (Time, Place, and Person)  Thought Content: Logical   Suicidal Thoughts:  No  Homicidal Thoughts:   No  Memory:  Immediate;   Fair Recent;   Fair Remote;   Fair  Judgement:  Fair  Insight:  Fair  Psychomotor Activity:  UTA  Concentration:  Concentration: Fair and Attention Span: Fair  Recall:  [employer] of Knowledge: Fair  Language: Fair  Akathisia:  No  Handed:  Right  AIMS (if indicated): UTA  Assets:  Communication Skills Desire for Improvement Housing Social Support  ADL's:  Intact  Cognition: WNL  Sleep:  Poor   Screenings: GAD-7   Flowsheet Row Office Visit from 12/28/2016 in [place]  Total GAD-7 Score 21    PHQ2-9   Flowsheet Row Video Visit from 10/11/2020 in [facility] from 09/09/2020 in [facility] Visit from 07/11/2020 in [place] Visit from 09/28/2019 in [facility] from 05/18/2019 in [facility]  PHQ-2 Total Score 0 0 3 0 1  PHQ-9 Total Score - - 6 1 -    Flowsheet Row Video Visit from 10/11/2020 in [facility] No Risk       Assessment and Plan: Bonnie Ochoa is a 55 year old African-American female who has a history of bipolar disorder, PTSD, panic attacks, cannabis use disorder, congestive heart failure, liver disease, chronic renal failure, diabetes melitis, RLS, multiple medical problems was evaluated by telemedicine today.  Patient is currently struggling with sleep although reports her mood swings as  improving.  Plan as noted below.  Plan Bipolar disorder depressed-improving Risperidone at higher dosage of 0.25 mg p.o. daily in the morning and 2 mg p.o. nightly Lamotrigine 150 mg p.o. daily Paxil 30 mg p.o. daily  PTSD-stable Paxil as prescribed  Panic disorder-stable Paxil as prescribed  Insomnia-unstable Increase Ambien to 10 mg p.o. nightly as needed Continue CPAP for OSA  Cannabis use disorder-unstable Provided counseling.  Discussed with  patient that she will not be prescribed Ambien if she continues to use cannabis. Patient agrees to stop smoking cannabis. Will consider getting urine drug screen.   Patient advised to come into the office for in person visit for her next appointment.   I have spent at least 20 minutes with patient today.  More than 50% of the time was spent for preparing to see the patient  (e.g., review of test, records), ordering medications and test, psychoeducation and supportive psychotherapy, care coordination as well as documenting clinical information in electronic health record.   This note was generated in part or whole with voice recognition software. Voice recognition is usually quite accurate but there are transcription errors that can and very often do occur. I apologize for any typographical errors that were not detected and corrected.        [name], MD 10/11/2020, 9:36 PM

## 2020-10-11 NOTE — Patient Instructions (Addendum)
Medication Instructions:  No changes  If you need a refill on your cardiac medications before your next appointment, please call your pharmacy.    Lab work: No new labs needed   If you have labs (blood work) drawn today and your tests are completely normal, you will receive your results only by: . MyChart Message (if you have MyChart) OR . A paper copy in the mail If you have any lab test that is abnormal or we need to change your treatment, we will call you to review the results.   Testing/Procedures: No new testing needed   Follow-Up: At [facility], you and your health needs are our priority.  As part of our continuing mission to provide you with exceptional heart care, we have created designated Provider Care Teams.  These Care Teams include your primary Cardiologist (physician) and Advanced Practice Providers (APPs -  Physician Assistants and Nurse Practitioners) who all work together to provide you with the care you need, when you need it.  . You will need a follow up appointment in 6 months  . Providers on your designated Care Team:   . Christopher Berge, NP . [name], PA-C . Jacquelyn Visser, PA-C  Any Other Special Instructions Will Be Listed Below (If Applicable).  COVID-19 Vaccine Information can be found at: [email or web address] For questions related to vaccine distribution or appointments, please email [email or web address] or call [phone].     

## 2020-10-12 NOTE — Progress Notes (Signed)
Advanced Heart Failure Clinic Note  Primary Care: Dr. Rosita Kea Primary Cardiologist: Dr. Rockey Situ  Nephrology: Dr. Billey Chang HF MD: Dr. Haroldine Laws  Reason for Visit: Routien F/u for Chronic Systolic Heart Failure   HPI: Bonnie Ochoa is a 55 year old with a history of NICM, chronic systolic heart failure, St Jude single chamber ICD,  DMII, HTN, bipolar disorder, OSA, PTSD, depression, morbid obesity, former heavy smoker, and cirrhosis. Initially diagnosed with HF in 2008 while living in [place]. In 2017 she relocated to [place] to be closer to her sister.   Admitted to [facility] in June 07, 2018 with chest pain and shortness of breath. Diuresed with IV lasix and later underwent RHC/LHC. She has elevated filling pressures and low cardiac index was 1.5. Normal coronaries   Echo 7/21 EF 20-25% RV ok.   Last visit 11/21 Entresto increased and lasix changed to MWF. Had repeat echo 12/21, EF 25-30%. RV normal.   Today she returns for HF follow up. Doing well. NYHA Class II. Volume status good. Wt down 6 lb from previous visit. Impedence stable/ c/w euvolemia on device interrogation. No VT. BP elevated at 149/90. Reports full med compliance. No intolerances. Interested in starting cardiac rehab in [place] but has not been contacted for enrollment yet.  Has OSA but currently not using CPAP. Device broken. No replacement yet.     Cardiac Studies: Echo (12/21): EF 25-30%, severe LV dysfunction, global hypokinesis, RV ok, moderate MR  CPX 07/16/2018  Peak VO2: 11.8 (66% predicted peak VO2) VE/VCO2 slope: 37 OUES: 1.12 Peak RER: 1.09 Moderate limitation with moderately elevated VE/VCO2.   05/2018 RHC/LHC  Normal coronary arteries. PA 65/40 Mean 52  PCWP 32 PVR 6.6 CO 3.04 CI 1.56.   ECHO 06/08/2018  Left ventricle: The cavity size was severely dilated. Wall   thickness was at the upper limits of normal. Systolic function   was severely reduced. The estimated ejection fraction was in the   range  of 25% to 30%. Diffuse hypokinesis. Features are consistent   with a pseudonormal left ventricular filling pattern, with   concomitant abnormal relaxation and increased filling pressure   (grade 2 diastolic dysfunction). Doppler parameters are   consistent with high ventricular filling pressure. - Aortic valve: Trileaflet; mildly thickened, mildly calcified   leaflets. There was trivial regurgitation. - Mitral valve: There was at least moderate regurgitation. - Left atrium: The atrium was mildly dilated. - Right ventricle: The cavity size was mildly dilated. Wall   thickness was normal. Systolic function was mildly reduced. - Right atrium: The atrium was mildly dilated. - Tricuspid valve: There was moderate-severe regurgitation. - Pulmonary arteries: Systolic pressure was moderately increased,   in the range of 45 mm Hg to 50 mm Hg. - Inferior vena cava: The vessel was dilated. The respirophasic   diameter changes were blunted (< 50%), consistent with elevated   central venous pressure. - Pericardium, extracardiac: A small pericardial effusion was   identified.   ROS: All systems reviewed and negative except as per HPI.   Past Medical History:  Diagnosis Date  . Abdominal pain    a. 05/2018 HIDA scan wnl.  . ADHD   . AICD (automatic cardioverter/defibrillator) present    a. 11/2014 s/p SJM Fortify Assura, single lead AICD (ser# [id number]).  Marland Kitchen Anemia   . Arthritis   . Asthma   . Bipolar 1 disorder ([place])   . Chest pain    a. Hx of cath in [place] - reportedly nl; b.  04/2018 MV: EF 22%, fixed dist ant septal, apical, and inferoapical defects - ? scar vs. attenuation. No ischemia.  . CHF (congestive heart failure) ([place])   . Cirrhosis of liver ([place])   . Coronary artery disease   . Depression   . Diabetes mellitus without complication ([place])   . Diverticulitis   . Dysrhythmia   . Heart murmur   . HFrEF (heart failure with reduced ejection fraction) ([place])    a. 06/2017 Echo: EF 20-25%,  diff HK. Mildly dil LA; b. 05/2018 Echo: EF 25-30%, diff HK, Gr2 DD. Triv AI. Mod MR. Mildly reduced RV fxn. Mod-Sev TR. PASP 45-82mHg.  .Marland KitchenHypertension   . Hypothyroidism   . IBS (irritable bowel syndrome)   . Insomnia   . Migraines   . NICM (nonischemic cardiomyopathy) ([place]    a. EF prev 25%; b. 11/2014 s/p SJM Fortify Assura, single lead AICD (ser# [id number]; c. 06/2017 Echo: EF 20-25%; d. 05/2018 Echo: EF 25-30%, diff HK, Gr2 DD; e. 05/2018 Echo: EF 25-30%; f. 05/2018 Cath: Nl cors. LVEDP 228mg, PCWP 3266m. PA 65/40 (52). CO/CI 3.04/1.56; c. 07/2018 CPX: Mod HF limitation.  . OSA (obstructive sleep apnea)    CPAP is broken  . PTSD (post-traumatic stress disorder)   . Restless leg syndrome   . Vertigo     Current Outpatient Medications  Medication Sig Dispense Refill  . blood glucose meter kit and supplies KIT Dispense based on patient and insurance preference. Use up to four times daily as directed. (FOR ICD-9 250.00, 250.01). 1 each 0  . carvedilol (COREG) 12.5 MG tablet Take 1 tablet (12.5 mg total) by mouth 2 (two) times daily with a meal. 180 tablet 3  . cyclobenzaprine (FLEXERIL) 5 MG tablet TAKE 1 TABLET BY MOUTH THREE TIMES A DAY AS NEEDED FOR MUSCLE SPASMS 60 tablet 1  . digoxin (LANOXIN) 0.125 MG tablet TAKE 1 TABLET BY MOUTH DAILY 90 tablet 3  . empagliflozin (JARDIANCE) 25 MG TABS tablet Take 1 tablet (25 mg total) by mouth daily. 90 tablet 1  . famotidine (PEPCID) 40 MG tablet TAKE 1 TABLET BY MOUTH EVERY DAY IN THE EVENING 90 tablet 3  . furosemide (LASIX) 20 MG tablet Take 1 tablet (20 mg total) by mouth as directed. Take 62m74m tablet) Monday, Wednesday, Friday. 40 tablet 3  . insulin glargine (LANTUS SOLOSTAR) 100 UNIT/ML Solostar Pen Inject 35 Units into the skin daily at 10 pm. 15 mL 3  . Insulin Pen Needle 32G X 6 MM MISC 1 each by Does not apply route daily. 100 each 12  . lamoTRIgine (LAMICTAL) 150 MG tablet Take 1 tablet (150 mg total) by mouth daily. 90 tablet 0   . metoCLOPramide (REGLAN) 10 MG tablet Take 10 mg by mouth 4 (four) times daily.    . pantoprazole (PROTONIX) 40 MG tablet TAKE 1 TABLET BY MOUTH EVERY DAY 90 tablet 1  . PARoxetine (PAXIL) 30 MG tablet Take 1 tablet (30 mg total) by mouth daily. 90 tablet 1  . potassium chloride SA (KLOR-CON M20) 20 MEQ tablet TAKE 2 TABLETS (40 MEQ TOTAL) BY MOUTH 2 (TWO) TIMES DAILY. 180 tablet 3  . risperiDONE (RISPERDAL) 0.25 MG tablet TAKE 1 TABLET (0.25 MG TOTAL) BY MOUTH EVERY MORNING. 90 tablet 1  . rosuvastatin (CRESTOR) 40 MG tablet Take 1 tablet (40 mg total) by mouth daily. 90 tablet 3  . sacubitril-valsartan (ENTRESTO) 49-51 MG Take 1 tablet by mouth 2 (two) times daily. 60 tablet 11  .  spironolactone (ALDACTONE) 25 MG tablet Take 1 tablet (25 mg total) by mouth daily. 90 tablet 3  . sucralfate (CARAFATE) 1 g tablet TAKE 1 TABLET (1 G TOTAL) BY MOUTH 4 (FOUR) TIMES DAILY. 360 tablet 3  . zolpidem (AMBIEN) 10 MG tablet Take 1 tablet (10 mg total) by mouth at bedtime as needed for sleep. 21 tablet 0   No current facility-administered medications for this encounter.    Allergies  Allergen Reactions  . Levothyroxine Rash     Social History   Socioeconomic History  . Marital status: Married    Spouse name: Not on file  . Number of children: 2  . Years of education: Not on file  . Highest education level: High school graduate  Occupational History    Comment: disabled  Tobacco Use  . Smoking status: Former Smoker    Types: Cigarettes    Quit date: 1985    Years since quitting: 37.1  . Smokeless tobacco: Never Used  . Tobacco comment: quit over  20 years ago   Vaping Use  . Vaping Use: Never used  Substance and Sexual Activity  . Alcohol use: Not Currently  . Drug use: Yes    Frequency: 1.0 times per week    Types: Marijuana    Comment: occassionally  . Sexual activity: Yes    Birth control/protection: Surgical    Comment: one partner  Other Topics Concern  . Not on file   Social History Narrative   Volunteers occasionally    Social Determinants of Health   Financial Resource Strain: Low Risk   . Difficulty of Paying Living Expenses: Not hard at all  Food Insecurity: Food Insecurity Present  . Worried About Charity fundraiser in the Last Year: Sometimes true  . Ran Out of Food in the Last Year: Sometimes true  Transportation Needs: No Transportation Needs  . Lack of Transportation (Medical): No  . Lack of Transportation (Non-Medical): No  Physical Activity: Insufficiently Active  . Days of Exercise per Week: 3 days  . Minutes of Exercise per Session: 30 min  Stress: No Stress Concern Present  . Feeling of Stress : Not at all  Social Connections: Not on file  Intimate Partner Violence: Not on file    Family History  Problem Relation Age of Onset  . Hypertension Mother   . Hyperlipidemia Mother   . Heart disease Mother   . Hypertension Sister   . Asthma Sister   . Heart disease Sister   . Diabetes Sister   . Cancer Sister   . Alzheimer's disease Maternal Grandfather   . Hyperlipidemia Brother   . Asthma Sister   . Hypertension Sister   . Diabetes Sister   . Breast cancer Paternal Aunt     Vitals:   10/13/20 1444  BP: (!) 149/90  Pulse: 90  SpO2: 98%  Weight: 80.4 kg (177 lb 3.2 oz)   Wt Readings from Last 3 Encounters:  10/13/20 80.4 kg (177 lb 3.2 oz)  10/11/20 80.1 kg (176 lb 8 oz)  09/09/20 78.9 kg (174 lb)   PHYSICAL EXAM: General:  Well appearing. No respiratory difficulty HEENT: normal Neck: supple. no JVD. Carotids 2+ bilat; no bruits. No lymphadenopathy or thyromegaly appreciated. Cor: PMI nondisplaced. Regular rate & rhythm. No rubs, gallops or murmurs. Lungs: clear Abdomen: soft, nontender, nondistended. No hepatosplenomegaly. No bruits or masses. Good bowel sounds. Extremities: no cyanosis, clubbing, rash, edema Neuro: alert & oriented x 3, cranial nerves grossly intact.  moves all 4 extremities w/o difficulty.  Affect pleasant.  ICD interrogation: Impedence stable/ c/w euvolemia on device interrogation. No VT (personally reviewed).  ASSESSMENT & PLAN:  1. Chronic Systolic/Diastolic  Heart Failure: NICM.  RHC/LHC 05/2018 showed cors normal. Cardiac output 3. Cardiac Index 1.56.  - CPX 07/2018 -slope 37, RER 1.1  VO 2 11.8  - Echo 06/08/2018 EF 25-30% Grade II DD. - Echo 7/21 EF 20-25% RV ok.  - Echo 12/21: EF 25-30%, severe LV dysfunction, global hypokinesis, RV ok, moderate MR - Has St Jude single chamber ICD.  No VT on device interrogation  - NYHA Class II - Volume status ok on exam. Impedence stable/ c/w euvolemia on device interrogation - Increase Entresto to 97-103 mb bid  - Continue spironolactone 25 mg daily.  - Continue carvedilol 12.5 mg bid. - Continue digoxin 0.125 mg daily. Check dig level  - Continue lasix 20 mg M/W/F. - Check BMP today and again in 7 days, will reduce Lasix further if needed pending SCr/BUN  - Send new referral to CR in [place] - Discussed importance of getting back on CPAP therapy for OSA      2. HTN  - Mildly elevated - Increased Entresto to 97-103 bid  - Check BMP   3. CKD Stage II  - Last SCr 1.1 - Check BMP today and again in 7 days w/ Entresto titration   4. DMII - On Jardiance 25 mg daily. No GU symptoms. - On insulin per PCP.    5. OSA - CPAP at home but machine broken. - discussed importance of obtaining new device - Follow back with sleep medicine .  6. Obesity  Body mass index is 29.49 kg/m.  - Encourage increased physical activity, refer to cardiac rehab   7. Mitral Regurgitation  - Moderate on Echo 12/21, likely functional from DCM  - Stable NYHA Class II symptoms    Follow back in 3 months w/ Dr. Haroldine Laws

## 2020-10-12 NOTE — Addendum Note (Signed)
Addended by: Ileene Musa D on: 10/12/2020 04:31 PM   Modules accepted: Orders

## 2020-10-13 ENCOUNTER — Ambulatory Visit (HOSPITAL_COMMUNITY)
Admission: RE | Admit: 2020-10-13 | Discharge: 2020-10-13 | Disposition: A | Discharge status: Home / Self Care | Payer: Medicare Other | Source: Ambulatory Visit | Attending: Family Medicine | Admitting: Family Medicine

## 2020-10-13 ENCOUNTER — Encounter (HOSPITAL_COMMUNITY): Payer: Self-pay

## 2020-10-13 ENCOUNTER — Other Ambulatory Visit: Payer: Self-pay

## 2020-10-13 VITALS — BP 149/90 | HR 90 | Wt 177.2 lb

## 2020-10-13 DIAGNOSIS — Z6829 Body mass index (BMI) 29.0-29.9, adult: Secondary | ICD-10-CM | POA: Insufficient documentation

## 2020-10-13 DIAGNOSIS — I428 Other cardiomyopathies: Secondary | ICD-10-CM | POA: Insufficient documentation

## 2020-10-13 DIAGNOSIS — Z8249 Family history of ischemic heart disease and other diseases of the circulatory system: Secondary | ICD-10-CM | POA: Diagnosis not present

## 2020-10-13 DIAGNOSIS — Z87891 Personal history of nicotine dependence: Secondary | ICD-10-CM | POA: Diagnosis not present

## 2020-10-13 DIAGNOSIS — I5042 Chronic combined systolic (congestive) and diastolic (congestive) heart failure: Secondary | ICD-10-CM | POA: Diagnosis not present

## 2020-10-13 DIAGNOSIS — I130 Hypertensive heart and chronic kidney disease with heart failure and stage 1 through stage 4 chronic kidney disease, or unspecified chronic kidney disease: Secondary | ICD-10-CM | POA: Diagnosis not present

## 2020-10-13 DIAGNOSIS — Z833 Family history of diabetes mellitus: Secondary | ICD-10-CM | POA: Diagnosis not present

## 2020-10-13 DIAGNOSIS — E669 Obesity, unspecified: Secondary | ICD-10-CM | POA: Diagnosis not present

## 2020-10-13 DIAGNOSIS — Z713 Dietary counseling and surveillance: Secondary | ICD-10-CM | POA: Diagnosis not present

## 2020-10-13 DIAGNOSIS — I5022 Chronic systolic (congestive) heart failure: Secondary | ICD-10-CM | POA: Diagnosis not present

## 2020-10-13 DIAGNOSIS — N182 Chronic kidney disease, stage 2 (mild): Secondary | ICD-10-CM | POA: Diagnosis not present

## 2020-10-13 DIAGNOSIS — Z95810 Presence of automatic (implantable) cardiac defibrillator: Secondary | ICD-10-CM | POA: Diagnosis not present

## 2020-10-13 DIAGNOSIS — Z79899 Other long term (current) drug therapy: Secondary | ICD-10-CM | POA: Insufficient documentation

## 2020-10-13 DIAGNOSIS — E1122 Type 2 diabetes mellitus with diabetic chronic kidney disease: Secondary | ICD-10-CM | POA: Diagnosis not present

## 2020-10-13 DIAGNOSIS — G4733 Obstructive sleep apnea (adult) (pediatric): Secondary | ICD-10-CM | POA: Insufficient documentation

## 2020-10-13 DIAGNOSIS — I340 Nonrheumatic mitral (valve) insufficiency: Secondary | ICD-10-CM | POA: Diagnosis not present

## 2020-10-13 LAB — BASIC METABOLIC PANEL
Anion gap: 11 (ref 5–15)
BUN: 17 mg/dL (ref 6–20)
CO2: 24 mmol/L (ref 22–32)
Calcium: 9.4 mg/dL (ref 8.9–10.3)
Chloride: 105 mmol/L (ref 98–111)
Creatinine, Ser: 1 mg/dL (ref 0.44–1.00)
GFR, Estimated: >60 mL/min (ref >60)
Glucose, Bld: 111 mg/dL — ABNORMAL HIGH (ref 70–99)
Potassium: 4 mmol/L (ref 3.5–5.1)
Sodium: 140 mmol/L (ref 135–145)

## 2020-10-13 LAB — DIGOXIN LEVEL: Digoxin Level: 0.6 ng/mL — ABNORMAL LOW (ref 0.8–2.0)

## 2020-10-13 MED ORDER — ENTRESTO 97-103 MG PO TABS
1.0000 | ORAL_TABLET | Freq: Two times a day (BID) | ORAL | 11 refills | Status: DC
Start: 2020-10-13 — End: 2021-10-30

## 2020-10-13 NOTE — Progress Notes (Signed)
Per provider CPAP machine is broken. Reviewed chart and in the past [employer] has provided patient with equipment and supplies. Original order written by Dr Laural Benes. Phone number and copy of order given to patient. Advised to reach out to the company to request services and or equipment. If add'l/ updated order is needed advised pt to have [facility] contact PCP.  Also in need of cardiac rehab-order placed in 07/2020, will reach out to rehab so they can contact patient again.

## 2020-10-13 NOTE — Addendum Note (Signed)
Encounter addended by: Theresia Bough, CMA on: 10/13/2020 3:45 PM  Actions taken: Clinical Note Signed

## 2020-10-13 NOTE — Patient Instructions (Signed)
INCREASE Entresto to 97/103 mg, one tab twice a day  Labs today We will only contact you if something comes back abnormal or we need to make some changes. Otherwise no news is good news!  Labs needed in 7-10 days  Your physician recommends that you schedule a follow-up appointment in: 2-3 months with Dr Gala Romney  You have been referred to Cardiac Rehab -they will be in contact for an appt  If you have any questions or concerns before your next appointment please send [place] a message through [facility] or call our office at [phone].    TO LEAVE A MESSAGE FOR THE NURSE SELECT OPTION 2, PLEASE LEAVE A MESSAGE INCLUDING: . YOUR NAME . DATE OF BIRTH . CALL BACK NUMBER . REASON FOR CALL**this is important as we prioritize the call backs  YOU WILL RECEIVE A CALL BACK THE SAME DAY AS LONG AS YOU CALL BEFORE 4:00 PM  Do the following things EVERYDAY: 1) Weigh yourself in the morning before breakfast. Write it down and keep it in a log. 2) Take your medicines as prescribed 3) Eat low salt foods-Limit salt (sodium) to 2000 mg per day.  4) Stay as active as you can everyday 5) Limit all fluids for the day to less than 2 liters   At the [facility], you and your health needs are our priority. As part of our continuing mission to provide you with exceptional heart care, we have created designated Provider Care Teams. These Care Teams include your primary Cardiologist (physician) and Advanced Practice Providers (APPs- Physician Assistants and Nurse Practitioners) who all work together to provide you with the care you need, when you need it.   You may see any of the following providers on your designated Care Team at your next follow up: Marland Kitchen Dr Arvilla Meres . Dr Marca Ancona . Dr Thornell Mule . Tonye Becket, NP . Robbie Lis, PA . Shanda Bumps [place],NP . Karle Plumber, PharmD   Please be sure to bring in all your medications bottles to every appointment.   Please  see our updated No Show and Same Day Appointment Cancellation Policy attached to your AVS.

## 2020-10-26 ENCOUNTER — Other Ambulatory Visit: Payer: Self-pay

## 2020-10-26 ENCOUNTER — Encounter: Payer: Medicare Other | Attending: Internal Medicine | Admitting: *Deleted

## 2020-10-26 DIAGNOSIS — I5022 Chronic systolic (congestive) heart failure: Secondary | ICD-10-CM | POA: Insufficient documentation

## 2020-10-26 NOTE — Progress Notes (Signed)
Virtual orientation call completed today. shehas an appointment on Date: 11/03/2020 for EP eval and gym Orientation.  Documentation of diagnosis can be found in [facility]  Date: 06/29/2020.

## 2020-11-03 ENCOUNTER — Encounter: Payer: Medicare Other | Admitting: *Deleted

## 2020-11-03 ENCOUNTER — Other Ambulatory Visit: Payer: Self-pay

## 2020-11-03 VITALS — Ht 65.0 in | Wt 178.2 lb

## 2020-11-03 DIAGNOSIS — I5022 Chronic systolic (congestive) heart failure: Secondary | ICD-10-CM | POA: Diagnosis not present

## 2020-11-03 NOTE — Patient Instructions (Signed)
Patient Instructions  Patient Details  Name: Bonnie Ochoa MRN: [id number] Date of Birth: 03-04-1966 Referring Provider:  Dolores Patty, MD  Below are your personal goals for exercise, nutrition, and risk factors. Our goal is to help you stay on track towards obtaining and maintaining these goals. We will be discussing your progress on these goals with you throughout the program.  Initial Exercise Prescription:  Initial Exercise Prescription - 11/03/20 1300      Date of Initial Exercise RX and Referring Provider   Date 11/03/20    Referring Provider Arvilla Meres MD   Primary Cardiologist: Dr. Julien Nordmann     Treadmill   MPH 2    Grade 1    Minutes 15    METs 2.81      Recumbant Bike   Level 2    RPM 50    Watts 28    Minutes 15    METs 2      Arm Ergometer   Level 1    Watts 25    RPM 25    Minutes 15    METs 2      REL-XR   Level 3    Speed 50    Minutes 15    METs 2      Prescription Details   Frequency (times per week) 3    Duration Progress to 30 minutes of continuous aerobic without signs/symptoms of physical distress      Intensity   THRR 40-80% of Max Heartrate 101-143    Ratings of Perceived Exertion 11-13    Perceived Dyspnea 0-4      Progression   Progression Continue to progress workloads to maintain intensity without signs/symptoms of physical distress.      Resistance Training   Training Prescription Yes    Weight 3 lb    Reps 10-15           Exercise Goals: Frequency: Be able to perform aerobic exercise two to three times per week in program working toward 2-5 days per week of home exercise.  Intensity: Work with a perceived exertion of 11 (fairly light) - 15 (hard) while following your exercise prescription.  We will make changes to your prescription with you as you progress through the program.   Duration: Be able to do 30 to 45 minutes of continuous aerobic exercise in addition to a 5 minute warm-up and a 5 minute  cool-down routine.   Nutrition Goals: Your personal nutrition goals will be established when you do your nutrition analysis with the dietician.  The following are general nutrition guidelines to follow: Cholesterol < 200mg /day Sodium < 1500mg /day Fiber: Women over 50 yrs - 21 grams per day  Personal Goals:  Personal Goals and Risk Factors at Admission - 11/03/20 1317      Core Components/Risk Factors/Patient Goals on Admission    Weight Management Yes;Weight Maintenance    Intervention Weight Management: Develop a combined nutrition and exercise program designed to reach desired caloric intake, while maintaining appropriate intake of nutrient and fiber, sodium and fats, and appropriate energy expenditure required for the weight goal.;Weight Management: Provide education and appropriate resources to help participant work on and attain dietary goals.;Obesity: Provide education and appropriate resources to help participant work on and attain dietary goals.    Admit Weight 178 lb 3.2 oz (80.8 kg)    Goal Weight: Short Term 173 lb (78.5 kg)    Goal Weight: Long Term 165 lb (74.8 kg)  Expected Outcomes Short Term: Continue to assess and modify interventions until short term weight is achieved;Long Term: Adherence to nutrition and physical activity/exercise program aimed toward attainment of established weight goal;Weight Loss: Understanding of general recommendations for a balanced deficit meal plan, which promotes 1-2 lb weight loss per week and includes a negative energy balance of [id number] kcal/d;Understanding recommendations for meals to include 15-35% energy as protein, 25-35% energy from fat, 35-60% energy from carbohydrates, less than 200mg  of dietary cholesterol, 20-35 gm of total fiber daily;Understanding of distribution of calorie intake throughout the day with the consumption of 4-5 meals/snacks    Diabetes Yes    Intervention Provide education about signs/symptoms and action to take for  hypo/hyperglycemia.;Provide education about proper nutrition, including hydration, and aerobic/resistive exercise prescription along with prescribed medications to achieve blood glucose in normal ranges: Fasting glucose 65-99 mg/dL    Expected Outcomes Short Term: Participant verbalizes understanding of the signs/symptoms and immediate care of hyper/hypoglycemia, proper foot care and importance of medication, aerobic/resistive exercise and nutrition plan for blood glucose control.;Long Term: Attainment of HbA1C < 7%.    Heart Failure Yes    Intervention Provide a combined exercise and nutrition program that is supplemented with education, support and counseling about heart failure. Directed toward relieving symptoms such as shortness of breath, decreased exercise tolerance, and extremity edema.    Expected Outcomes Improve functional capacity of life;Short term: Attendance in program 2-3 days a week with increased exercise capacity. Reported lower sodium intake. Reported increased fruit and vegetable intake. Reports medication compliance.;Short term: Daily weights obtained and reported for increase. Utilizing diuretic protocols set by physician.;Long term: Adoption of self-care skills and reduction of barriers for early signs and symptoms recognition and intervention leading to self-care maintenance.    Hypertension Yes    Intervention Provide education on lifestyle modifcations including regular physical activity/exercise, weight management, moderate sodium restriction and increased consumption of fresh fruit, vegetables, and low fat dairy, alcohol moderation, and smoking cessation.;Monitor prescription use compliance.    Expected Outcomes Short Term: Continued assessment and intervention until BP is < 140/62mm HG in hypertensive participants. < 130/68mm HG in hypertensive participants with diabetes, heart failure or chronic kidney disease.;Long Term: Maintenance of blood pressure at goal levels.    Lipids  Yes    Intervention Provide education and support for participant on nutrition & aerobic/resistive exercise along with prescribed medications to achieve LDL 70mg , HDL >40mg .    Expected Outcomes Short Term: Participant states understanding of desired cholesterol values and is compliant with medications prescribed. Participant is following exercise prescription and nutrition guidelines.;Long Term: Cholesterol controlled with medications as prescribed, with individualized exercise RX and with personalized nutrition plan. Value goals: LDL < 70mg , HDL > 40 mg.           Tobacco Use Initial Evaluation: Social History   Tobacco Use  Smoking Status Former Smoker  . Types: Cigarettes  . Quit date: 29  . Years since quitting: 37.2  Smokeless Tobacco Never Used  Tobacco Comment   quit over  20 years ago     Exercise Goals and Review:  Exercise Goals    Row Name 11/03/20 1315             Exercise Goals   Increase Physical Activity Yes       Intervention Provide advice, education, support and counseling about physical activity/exercise needs.;Develop an individualized exercise prescription for aerobic and resistive training based on initial evaluation findings, risk stratification, comorbidities and participant's personal goals.  Expected Outcomes Short Term: Attend rehab on a regular basis to increase amount of physical activity.;Long Term: Add in home exercise to make exercise part of routine and to increase amount of physical activity.;Long Term: Exercising regularly at least 3-5 days a week.       Increase Strength and Stamina Yes       Intervention Provide advice, education, support and counseling about physical activity/exercise needs.;Develop an individualized exercise prescription for aerobic and resistive training based on initial evaluation findings, risk stratification, comorbidities and participant's personal goals.       Expected Outcomes Short Term: Increase workloads  from initial exercise prescription for resistance, speed, and METs.;Long Term: Improve cardiorespiratory fitness, muscular endurance and strength as measured by increased METs and functional capacity ( );Short Term: Perform resistance training exercises routinely during rehab and add in resistance training at home       Able to understand and use rate of perceived exertion (RPE) scale Yes       Intervention Provide education and explanation on how to use RPE scale       Expected Outcomes Short Term: Able to use RPE daily in rehab to express subjective intensity level;Long Term:  Able to use RPE to guide intensity level when exercising independently       Able to understand and use Dyspnea scale Yes       Intervention Provide education and explanation on how to use Dyspnea scale       Expected Outcomes Short Term: Able to use Dyspnea scale daily in rehab to express subjective sense of shortness of breath during exertion;Long Term: Able to use Dyspnea scale to guide intensity level when exercising independently       Knowledge and understanding of Target Heart Rate Range (THRR) Yes       Intervention Provide education and explanation of THRR including how the numbers were predicted and where they are located for reference       Expected Outcomes Short Term: Able to state/look up THRR;Short Term: Able to use daily as guideline for intensity in rehab;Long Term: Able to use THRR to govern intensity when exercising independently       Able to check pulse independently Yes       Intervention Provide education and demonstration on how to check pulse in carotid and radial arteries.;Review the importance of being able to check your own pulse for safety during independent exercise       Expected Outcomes Short Term: Able to explain why pulse checking is important during independent exercise;Long Term: Able to check pulse independently and accurately       Understanding of Exercise Prescription Yes        Intervention Provide education, explanation, and written materials on patient's individual exercise prescription       Expected Outcomes Short Term: Able to explain program exercise prescription;Long Term: Able to explain home exercise prescription to exercise independently              Copy of goals given to participant.

## 2020-11-03 NOTE — Progress Notes (Signed)
Cardiac Individual Treatment Plan  Patient Details  Name: Bonnie Ochoa MRN: [id number] Date of Birth: Mar 23, 1966 Referring Provider:   Flowsheet Row Cardiac Rehab from 11/03/2020 in [facility] Cardiac and Pulmonary Rehab  Referring Provider Glori Bickers MD  [facility] Cardiologist: Dr. Ida Rogue      Initial Encounter Date:  Flowsheet Row Cardiac Rehab from 11/03/2020 in [facility] Cardiac and Pulmonary Rehab  Date 11/03/20      Visit Diagnosis: Chronic systolic heart failure ([place])  Patient's Home Medications on Admission:  Current Outpatient Medications:  .  blood glucose meter kit and supplies KIT, Dispense based on patient and insurance preference. Use up to four times daily as directed. (FOR ICD-9 250.00, 250.01)., Disp: 1 each, Rfl: 0 .  carvedilol (COREG) 12.5 MG tablet, Take 1 tablet (12.5 mg total) by mouth 2 (two) times daily with a meal., Disp: 180 tablet, Rfl: 3 .  cyclobenzaprine (FLEXERIL) 5 MG tablet, TAKE 1 TABLET BY MOUTH THREE TIMES A DAY AS NEEDED FOR MUSCLE SPASMS, Disp: 60 tablet, Rfl: 1 .  digoxin (LANOXIN) 0.125 MG tablet, TAKE 1 TABLET BY MOUTH DAILY, Disp: 90 tablet, Rfl: 3 .  empagliflozin (JARDIANCE) 25 MG TABS tablet, Take 1 tablet (25 mg total) by mouth daily., Disp: 90 tablet, Rfl: 1 .  famotidine (PEPCID) 40 MG tablet, TAKE 1 TABLET BY MOUTH EVERY DAY IN THE EVENING, Disp: 90 tablet, Rfl: 3 .  furosemide (LASIX) 20 MG tablet, Take 1 tablet (20 mg total) by mouth as directed. Take 46m (1 tablet) Monday, Wednesday, Friday., Disp: 40 tablet, Rfl: 3 .  insulin glargine (LANTUS SOLOSTAR) 100 UNIT/ML Solostar Pen, Inject 35 Units into the skin daily at 10 pm., Disp: 15 mL, Rfl: 3 .  Insulin Pen Needle 32G X 6 MM MISC, 1 each by Does not apply route daily., Disp: 100 each, Rfl: 12 .  lamoTRIgine (LAMICTAL) 150 MG tablet, Take 1 tablet (150 mg total) by mouth daily., Disp: 90 tablet, Rfl: 0 .  metoCLOPramide (REGLAN) 10 MG tablet, Take 10 mg by mouth 4 (four) times  daily., Disp: , Rfl:  .  pantoprazole (PROTONIX) 40 MG tablet, TAKE 1 TABLET BY MOUTH EVERY DAY, Disp: 90 tablet, Rfl: 1 .  PARoxetine (PAXIL) 30 MG tablet, Take 1 tablet (30 mg total) by mouth daily., Disp: 90 tablet, Rfl: 1 .  potassium chloride SA (KLOR-CON M20) 20 MEQ tablet, TAKE 2 TABLETS (40 MEQ TOTAL) BY MOUTH 2 (TWO) TIMES DAILY., Disp: 180 tablet, Rfl: 3 .  risperiDONE (RISPERDAL) 0.25 MG tablet, TAKE 1 TABLET (0.25 MG TOTAL) BY MOUTH EVERY MORNING., Disp: 90 tablet, Rfl: 1 .  rosuvastatin (CRESTOR) 40 MG tablet, Take 1 tablet (40 mg total) by mouth daily., Disp: 90 tablet, Rfl: 3 .  sacubitril-valsartan (ENTRESTO) 97-103 MG, Take 1 tablet by mouth 2 (two) times daily., Disp: 60 tablet, Rfl: 11 .  spironolactone (ALDACTONE) 25 MG tablet, Take 1 tablet (25 mg total) by mouth daily., Disp: 90 tablet, Rfl: 3 .  sucralfate (CARAFATE) 1 g tablet, TAKE 1 TABLET (1 G TOTAL) BY MOUTH 4 (FOUR) TIMES DAILY., Disp: 360 tablet, Rfl: 3 .  zolpidem (AMBIEN) 10 MG tablet, Take 1 tablet (10 mg total) by mouth at bedtime as needed for sleep., Disp: 21 tablet, Rfl: 0  Past Medical History: Past Medical History:  Diagnosis Date  . Abdominal pain    a. 05/2018 HIDA scan wnl.  . ADHD   . AICD (automatic cardioverter/defibrillator) present    a. 11/2014 s/p SJM Fortify  Assura, single lead AICD (ser# [id number]).  Marland Kitchen Anemia   . Arthritis   . Asthma   . Bipolar 1 disorder ([place])   . Chest pain    a. Hx of cath in [place] - reportedly nl; b. 04/2018 MV: EF 22%, fixed dist ant septal, apical, and inferoapical defects - ? scar vs. attenuation. No ischemia.  . CHF (congestive heart failure) ([place])   . Cirrhosis of liver ([place])   . Coronary artery disease   . Depression   . Diabetes mellitus without complication ([place])   . Diverticulitis   . Dysrhythmia   . Heart murmur   . HFrEF (heart failure with reduced ejection fraction) ([place])    a. 06/2017 Echo: EF 20-25%, diff HK. Mildly dil LA; b. 05/2018 Echo: EF 25-30%,  diff HK, Gr2 DD. Triv AI. Mod MR. Mildly reduced RV fxn. Mod-Sev TR. PASP 45-77mmHg.  Marland Kitchen Hypertension   . Hypothyroidism   . IBS (irritable bowel syndrome)   . Insomnia   . Migraines   . NICM (nonischemic cardiomyopathy) ([place])    a. EF prev 25%; b. 11/2014 s/p SJM Fortify Assura, single lead AICD (ser# [id number]); c. 06/2017 Echo: EF 20-25%; d. 05/2018 Echo: EF 25-30%, diff HK, Gr2 DD; e. 05/2018 Echo: EF 25-30%; f. 05/2018 Cath: Nl cors. LVEDP 47mmHg, PCWP 45mmHg. PA 65/40 (52). CO/CI 3.04/1.56; c. 07/2018 CPX: Mod HF limitation.  . OSA (obstructive sleep apnea)    CPAP is broken  . PTSD (post-traumatic stress disorder)   . Restless leg syndrome   . Vertigo     Tobacco Use: Social History   Tobacco Use  Smoking Status Former Smoker  . Types: Cigarettes  . Quit date: 76  . Years since quitting: 37.2  Smokeless Tobacco Never Used  Tobacco Comment   quit over  20 years ago     Labs: Recent Review Flowsheet Data    Labs for ITP Cardiac and Pulmonary Rehab Latest Ref Rng & Units 05/14/2019 09/28/2019 12/25/2019 04/06/2020 07/11/2020   Cholestrol 100 - 199 mg/dL - 214(H) - - 152   LDLCALC 0 - 99 mg/dL - 121(H) - - 84   HDL >39 mg/dL - 36(L) - - 39(L)   Trlycerides 0 - 149 mg/dL - 321(H) - - 168(H)   Hemoglobin A1c <7.0 % 12.7(H) 9.9(H) 10.1(H) 8.5(H) 6.8       Exercise Target Goals: Exercise Program Goal: Individual exercise prescription set using results from initial 6 min walk test and THRR while considering  patient's activity barriers and safety.   Exercise Prescription Goal: Initial exercise prescription builds to 30-45 minutes a day of aerobic activity, 2-3 days per week.  Home exercise guidelines will be given to patient during program as part of exercise prescription that the participant will acknowledge.   Education: Aerobic Exercise: - Group verbal and visual presentation on the components of exercise prescription. Introduces F.I.T.T principle from ACSM for exercise  prescriptions.  Reviews F.I.T.T. principles of aerobic exercise including progression. Written material given at graduation. Flowsheet Row Cardiac Rehab from 11/03/2020 in [facility] Cardiac and Pulmonary Rehab  Education need identified 11/03/20      Education: Resistance Exercise: - Group verbal and visual presentation on the components of exercise prescription. Introduces F.I.T.T principle from ACSM for exercise prescriptions  Reviews F.I.T.T. principles of resistance exercise including progression. Written material given at graduation.    Education: Exercise & Equipment Safety: - Individual verbal instruction and demonstration of equipment use and safety with use of the equipment. Flowsheet  Row Cardiac Rehab from 11/03/2020 in [facility] Cardiac and Pulmonary Rehab  Date 11/03/20  Educator [facility]  Instruction Review Code 1- Verbalizes Understanding      Education: Exercise Physiology & General Exercise Guidelines: - Group verbal and written instruction with models to review the exercise physiology of the cardiovascular system and associated critical values. Provides general exercise guidelines with specific guidelines to those with heart or lung disease.    Education: Flexibility, Balance, Mind/Body Relaxation: - Group verbal and visual presentation with interactive activity on the components of exercise prescription. Introduces F.I.T.T principle from ACSM for exercise prescriptions. Reviews F.I.T.T. principles of flexibility and balance exercise training including progression. Also discusses the mind body connection.  Reviews various relaxation techniques to help reduce and manage stress (i.e. Deep breathing, progressive muscle relaxation, and visualization). Balance handout provided to take home. Written material given at graduation.   Activity Barriers & Risk Stratification:  Activity Barriers & Cardiac Risk Stratification - 11/03/20 1312      Activity Barriers & Cardiac Risk Stratification    Activity Barriers Chest Pain/Angina;Back Problems;Muscular Weakness;Deconditioning;Balance Concerns    Cardiac Risk Stratification High           6 Minute Walk:  6 Minute Walk    Row Name 11/03/20 1311         6 Minute Walk   Phase Initial     Distance 1095 feet     Walk Time 6 minutes     # of Rest Breaks 0     MPH 2.07     METS 3.1     RPE 13     VO2 Peak 10.86     Symptoms Yes (comment)     Comments R thigh pain 4/10     Resting HR 59 bpm     Resting BP 142/84     Resting Oxygen Saturation  97 %     Exercise Oxygen Saturation  during 6 min walk 99 %     Max Ex. HR 81 bpm     Max Ex. BP 154/86     2 Minute Post BP 138/80            Oxygen Initial Assessment:   Oxygen Re-Evaluation:   Oxygen Discharge (Final Oxygen Re-Evaluation):   Initial Exercise Prescription:  Initial Exercise Prescription - 11/03/20 1300      Date of Initial Exercise RX and Referring Provider   Date 11/03/20    Referring Provider Glori Bickers MD   Primary Cardiologist: Dr. Ida Rogue     Treadmill   MPH 2    Grade 1    Minutes 15    METs 2.81      Recumbant Bike   Level 2    RPM 50    Watts 28    Minutes 15    METs 2      Arm Ergometer   Level 1    Watts 25    RPM 25    Minutes 15    METs 2      REL-XR   Level 3    Speed 50    Minutes 15    METs 2      Prescription Details   Frequency (times per week) 3    Duration Progress to 30 minutes of continuous aerobic without signs/symptoms of physical distress      Intensity   THRR 40-80% of Max Heartrate 101-143    Ratings of Perceived Exertion 11-13    Perceived Dyspnea  0-4      Progression   Progression Continue to progress workloads to maintain intensity without signs/symptoms of physical distress.      Resistance Training   Training Prescription Yes    Weight 3 lb    Reps 10-15           Perform Capillary Blood Glucose checks as needed.  Exercise Prescription Changes:  Exercise  Prescription Changes    Row Name 11/03/20 1300             Response to Exercise   Blood Pressure (Admit) 142/84       Blood Pressure (Exercise) 154/86       Blood Pressure (Exit) 138/80       Heart Rate (Admit) 59 bpm       Heart Rate (Exercise) 81 bpm       Heart Rate (Exit) 70 bpm       Oxygen Saturation (Admit) 97 %       Oxygen Saturation (Exercise) 99 %       Rating of Perceived Exertion (Exercise) 13       Symptoms r thigh pain 4/10       Comments walk test results              Exercise Comments:   Exercise Goals and Review:  Exercise Goals    Row Name 11/03/20 1315             Exercise Goals   Increase Physical Activity Yes       Intervention Provide advice, education, support and counseling about physical activity/exercise needs.;Develop an individualized exercise prescription for aerobic and resistive training based on initial evaluation findings, risk stratification, comorbidities and participant's personal goals.       Expected Outcomes Short Term: Attend rehab on a regular basis to increase amount of physical activity.;Long Term: Add in home exercise to make exercise part of routine and to increase amount of physical activity.;Long Term: Exercising regularly at least 3-5 days a week.       Increase Strength and Stamina Yes       Intervention Provide advice, education, support and counseling about physical activity/exercise needs.;Develop an individualized exercise prescription for aerobic and resistive training based on initial evaluation findings, risk stratification, comorbidities and participant's personal goals.       Expected Outcomes Short Term: Increase workloads from initial exercise prescription for resistance, speed, and METs.;Long Term: Improve cardiorespiratory fitness, muscular endurance and strength as measured by increased METs and functional capacity (6MWT);Short Term: Perform resistance training exercises routinely during rehab and add in  resistance training at home       Able to understand and use rate of perceived exertion (RPE) scale Yes       Intervention Provide education and explanation on how to use RPE scale       Expected Outcomes Short Term: Able to use RPE daily in rehab to express subjective intensity level;Long Term:  Able to use RPE to guide intensity level when exercising independently       Able to understand and use Dyspnea scale Yes       Intervention Provide education and explanation on how to use Dyspnea scale       Expected Outcomes Short Term: Able to use Dyspnea scale daily in rehab to express subjective sense of shortness of breath during exertion;Long Term: Able to use Dyspnea scale to guide intensity level when exercising independently       Knowledge and understanding of  Target Heart Rate Range (THRR) Yes       Intervention Provide education and explanation of THRR including how the numbers were predicted and where they are located for reference       Expected Outcomes Short Term: Able to state/look up THRR;Short Term: Able to use daily as guideline for intensity in rehab;Long Term: Able to use THRR to govern intensity when exercising independently       Able to check pulse independently Yes       Intervention Provide education and demonstration on how to check pulse in carotid and radial arteries.;Review the importance of being able to check your own pulse for safety during independent exercise       Expected Outcomes Short Term: Able to explain why pulse checking is important during independent exercise;Long Term: Able to check pulse independently and accurately       Understanding of Exercise Prescription Yes       Intervention Provide education, explanation, and written materials on patient's individual exercise prescription       Expected Outcomes Short Term: Able to explain program exercise prescription;Long Term: Able to explain home exercise prescription to exercise independently               Exercise Goals Re-Evaluation :   Discharge Exercise Prescription (Final Exercise Prescription Changes):  Exercise Prescription Changes - 11/03/20 1300      Response to Exercise   Blood Pressure (Admit) 142/84    Blood Pressure (Exercise) 154/86    Blood Pressure (Exit) 138/80    Heart Rate (Admit) 59 bpm    Heart Rate (Exercise) 81 bpm    Heart Rate (Exit) 70 bpm    Oxygen Saturation (Admit) 97 %    Oxygen Saturation (Exercise) 99 %    Rating of Perceived Exertion (Exercise) 13    Symptoms r thigh pain 4/10    Comments walk test results           Nutrition:  Target Goals: Understanding of nutrition guidelines, daily intake of sodium '1500mg'$ , cholesterol '200mg'$ , calories 30% from fat and 7% or less from saturated fats, daily to have 5 or more servings of fruits and vegetables.  Education: All About Nutrition: -Group instruction provided by verbal, written material, interactive activities, discussions, models, and posters to present general guidelines for heart healthy nutrition including fat, fiber, MyPlate, the role of sodium in heart healthy nutrition, utilization of the nutrition label, and utilization of this knowledge for meal planning. Follow up email sent as well. Written material given at graduation. Flowsheet Row Cardiac Rehab from 11/03/2020 in [facility] Cardiac and Pulmonary Rehab  Education need identified 11/03/20      Biometrics:  Pre Biometrics - 11/03/20 1315      Pre Biometrics   Height $Remov'5\' 5"'haPVWV$  (1.651 m)    Weight 178 lb 3.2 oz (80.8 kg)    BMI (Calculated) 29.65    Single Leg Stand 10 seconds            Nutrition Therapy Plan and Nutrition Goals:   Nutrition Assessments:  MEDIFICTS Score Key:  ?70 Need to make dietary changes   40-70 Heart Healthy Diet  ? 40 Therapeutic Level Cholesterol Diet  Flowsheet Row Cardiac Rehab from 11/03/2020 in [facility] Cardiac and Pulmonary Rehab  Picture Your Plate Total Score on Admission 62     Picture Your  Plate Scores:  <62 Unhealthy dietary pattern with much room for improvement.  41-50 Dietary pattern unlikely to meet recommendations for good health  and room for improvement.  51-60 More healthful dietary pattern, with some room for improvement.   >60 Healthy dietary pattern, although there may be some specific behaviors that could be improved.    Nutrition Goals Re-Evaluation:   Nutrition Goals Discharge (Final Nutrition Goals Re-Evaluation):   Psychosocial: Target Goals: Acknowledge presence or absence of significant depression and/or stress, maximize coping skills, provide positive support system. Participant is able to verbalize types and ability to use techniques and skills needed for reducing stress and depression.   Education: Stress, Anxiety, and Depression - Group verbal and visual presentation to define topics covered.  Reviews how body is impacted by stress, anxiety, and depression.  Also discusses healthy ways to reduce stress and to treat/manage anxiety and depression.  Written material given at graduation. Flowsheet Row Cardiac Rehab from 11/03/2020 in [facility] Cardiac and Pulmonary Rehab  Education need identified 11/03/20      Education: Sleep Hygiene -Provides group verbal and written instruction about how sleep can affect your health.  Define sleep hygiene, discuss sleep cycles and impact of sleep habits. Review good sleep hygiene tips.    Initial Review & Psychosocial Screening:  Initial Psych Review & Screening - 10/26/20 1048      Initial Review   Current issues with Current Depression;Current Psychotropic Meds      Family Dynamics   Good Support System? Yes   wife Darryll Capers, sister, children live in [place]     Barriers   Psychosocial barriers to participate in program There are no identifiable barriers or psychosocial needs.      Screening Interventions   Interventions Encouraged to exercise;To provide support and resources with identified psychosocial  needs;Provide feedback about the scores to participant    Expected Outcomes Short Term goal: Utilizing psychosocial counselor, staff and physician to assist with identification of specific Stressors or current issues interfering with healing process. Setting desired goal for each stressor or current issue identified.;Long Term Goal: Stressors or current issues are controlled or eliminated.;Short Term goal: Identification and review with participant of any Quality of Life or Depression concerns found by scoring the questionnaire.;Long Term goal: The participant improves quality of Life and PHQ9 Scores as seen by post scores and/or verbalization of changes           Quality of Life Scores:   Quality of Life - 11/03/20 1315      Quality of Life   Select Quality of Life      Quality of Life Scores   Health/Function Pre 17.5 %    Socioeconomic Pre 18.56 %    Psych/Spiritual Pre 25.14 %    Family Pre 25.2 %    GLOBAL Pre 20.37 %          Scores of 19 and below usually indicate a poorer quality of life in these areas.  A difference of  2-3 points is a clinically meaningful difference.  A difference of 2-3 points in the total score of the Quality of Life Index has been associated with significant improvement in overall quality of life, self-image, physical symptoms, and general health in studies assessing change in quality of life.  PHQ-9: Recent Review Flowsheet Data    Depression screen [facility] 2/9 11/03/2020 09/09/2020 07/11/2020 09/28/2019 05/18/2019   Decreased Interest 0 0 2 0 1   Down, Depressed, Hopeless 2 0 1 0 0   PHQ - 2 Score 2 0 3 0 1   Altered sleeping 0 - 0 1 -   Tired, decreased energy  1 - 3 0 -   Change in appetite 0 - 0 0 -   Feeling bad or failure about yourself  2 - 0 0 -   Trouble concentrating 2 - 0 0 -   Moving slowly or fidgety/restless 0 - 0 0 -   Suicidal thoughts 0 - 0 0 -   PHQ-9 Score 7 - 6 1 -   Difficult doing work/chores Not difficult at all - - Not difficult at  all -     Interpretation of Total Score  Total Score Depression Severity:  1-4 = Minimal depression, 5-9 = Mild depression, 10-14 = Moderate depression, 15-19 = Moderately severe depression, 20-27 = Severe depression   Psychosocial Evaluation and Intervention:  Psychosocial Evaluation - 10/26/20 1111      Psychosocial Evaluation & Interventions   Interventions Encouraged to exercise with the program and follow exercise prescription    Comments Luvenia Starch has no barriers to starting the program. She has history of depression and is under the care of physician and has Paxil prscribed. She moved to [place] in the past 5 years with he sister. SHe is married for 3 years to her wife LaTricia.  She has grown children that live in [place]. Her wife aand sister are her support system. THey all live together in a home with no pets. Her goal is to increase her energy. She should do well in the program.    Expected Outcomes STG: Luvenia Starch attends all scheduled sessions, and is able to progress her exercise prescription over time. LTG: she has gained more energy/stamina and is able to maintain this with continuing what she learned in the program. [facility] keeps her depression under control with continued meds and counseling    Continue Psychosocial Services  Follow up required by staff           Psychosocial Re-Evaluation:   Psychosocial Discharge (Final Psychosocial Re-Evaluation):   Vocational Rehabilitation: Provide vocational rehab assistance to qualifying candidates.   Vocational Rehab Evaluation & Intervention:  Vocational Rehab - 10/26/20 1053      Initial Vocational Rehab Evaluation & Intervention   Assessment shows need for Vocational Rehabilitation No           Education: Education Goals: Education classes will be provided on a variety of topics geared toward better understanding of heart health and risk factor modification. Participant will state understanding/return demonstration of topics presented  as noted by education test scores.  Learning Barriers/Preferences:  Learning Barriers/Preferences - 10/26/20 1053      Learning Barriers/Preferences   Learning Barriers None    Learning Preferences None           General Cardiac Education Topics:  AED/CPR: - Group verbal and written instruction with the use of models to demonstrate the basic use of the AED with the basic ABC's of resuscitation.   Anatomy and Cardiac Procedures: - Group verbal and visual presentation and models provide information about basic cardiac anatomy and function. Reviews the testing methods done to diagnose heart disease and the outcomes of the test results. Describes the treatment choices: Medical Management, Angioplasty, or Coronary Bypass Surgery for treating various heart conditions including Myocardial Infarction, Angina, Valve Disease, and Cardiac Arrhythmias.  Written material given at graduation. Flowsheet Row Cardiac Rehab from 11/03/2020 in [facility] Cardiac and Pulmonary Rehab  Education need identified 11/03/20      Medication Safety: - Group verbal and visual instruction to review commonly prescribed medications for heart and lung disease. Reviews the medication,  class of the drug, and side effects. Includes the steps to properly store meds and maintain the prescription regimen.  Written material given at graduation.   Intimacy: - Group verbal instruction through game format to discuss how heart and lung disease can affect sexual intimacy. Written material given at graduation..   Know Your Numbers and Heart Failure: - Group verbal and visual instruction to discuss disease risk factors for cardiac and pulmonary disease and treatment options.  Reviews associated critical values for Overweight/Obesity, Hypertension, Cholesterol, and Diabetes.  Discusses basics of heart failure: signs/symptoms and treatments.  Introduces Heart Failure Zone chart for action plan for heart failure.  Written material given  at graduation.   Infection Prevention: - Provides verbal and written material to individual with discussion of infection control including proper hand washing and proper equipment cleaning during exercise session. Flowsheet Row Cardiac Rehab from 11/03/2020 in [facility] Cardiac and Pulmonary Rehab  Date 11/03/20  Educator [facility]  Instruction Review Code 1- Verbalizes Understanding      Falls Prevention: - Provides verbal and written material to individual with discussion of falls prevention and safety. Flowsheet Row Cardiac Rehab from 11/03/2020 in [facility] [facility]  Date 10/26/20  Educator SB  Instruction Review Code 1- Verbalizes Understanding      Other: -Provides group and verbal instruction on various topics (see comments)   Knowledge Questionnaire Score:  Knowledge Questionnaire Score - 11/03/20 1317      Knowledge Questionnaire Score   Pre Score 20/26 Education Focus: Nutrition, exercise, angina, depression           Core Components/Risk Factors/Patient Goals at Admission:  Personal Goals and Risk Factors at Admission - 11/03/20 1317      Core Components/Risk Factors/Patient Goals on Admission    Weight Management Yes;Weight Maintenance    Intervention Weight Management: Develop a combined nutrition and exercise program designed to reach desired caloric intake, while maintaining appropriate intake of nutrient and fiber, sodium and fats, and appropriate energy expenditure required for the weight goal.;Weight Management: Provide education and appropriate resources to help participant work on and attain dietary goals.;Obesity: Provide education and appropriate resources to help participant work on and attain dietary goals.    Admit Weight 178 lb 3.2 oz (80.8 kg)    Goal Weight: Short Term 173 lb (78.5 kg)    Goal Weight: Long Term 165 lb (74.8 kg)    Expected Outcomes Short Term: Continue to assess and modify interventions until short term weight is achieved;Long  Term: Adherence to nutrition and physical activity/exercise program aimed toward attainment of established weight goal;Weight Loss: Understanding of general recommendations for a balanced deficit meal plan, which promotes 1-2 lb weight loss per week and includes a negative energy balance of [phone] kcal/d;Understanding recommendations for meals to include 15-35% energy as protein, 25-35% energy from fat, 35-60% energy from carbohydrates, less than $RemoveB'200mg'[id number]$  of dietary cholesterol, 20-35 gm of total fiber daily;Understanding of distribution of calorie intake throughout the day with the consumption of 4-5 meals/snacks    Diabetes Yes    Intervention Provide education about signs/symptoms and action to take for hypo/hyperglycemia.;Provide education about proper nutrition, including hydration, and aerobic/resistive exercise prescription along with prescribed medications to achieve blood glucose in normal ranges: Fasting glucose 65-99 mg/dL    Expected Outcomes Short Term: Participant verbalizes understanding of the signs/symptoms and immediate care of hyper/hypoglycemia, proper foot care and importance of medication, aerobic/resistive exercise and nutrition plan for blood glucose control.;Long Term: Attainment of HbA1C < 7%.  Heart Failure Yes    Intervention Provide a combined exercise and nutrition program that is supplemented with education, support and counseling about heart failure. Directed toward relieving symptoms such as shortness of breath, decreased exercise tolerance, and extremity edema.    Expected Outcomes Improve functional capacity of life;Short term: Attendance in program 2-3 days a week with increased exercise capacity. Reported lower sodium intake. Reported increased fruit and vegetable intake. Reports medication compliance.;Short term: Daily weights obtained and reported for increase. Utilizing diuretic protocols set by physician.;Long term: Adoption of self-care skills and reduction of  barriers for early signs and symptoms recognition and intervention leading to self-care maintenance.    Hypertension Yes    Intervention Provide education on lifestyle modifcations including regular physical activity/exercise, weight management, moderate sodium restriction and increased consumption of fresh fruit, vegetables, and low fat dairy, alcohol moderation, and smoking cessation.;Monitor prescription use compliance.    Expected Outcomes Short Term: Continued assessment and intervention until BP is < 140/5m HG in hypertensive participants. < 130/826mHG in hypertensive participants with diabetes, heart failure or chronic kidney disease.;Long Term: Maintenance of blood pressure at goal levels.    Lipids Yes    Intervention Provide education and support for participant on nutrition & aerobic/resistive exercise along with prescribed medications to achieve LDL <7027mHDL >84m15m  Expected Outcomes Short Term: Participant states understanding of desired cholesterol values and is compliant with medications prescribed. Participant is following exercise prescription and nutrition guidelines.;Long Term: Cholesterol controlled with medications as prescribed, with individualized exercise RX and with personalized nutrition plan. Value goals: LDL < 70mg5mL > 40 mg.           Education:Diabetes - Individual verbal and written instruction to review signs/symptoms of diabetes, desired ranges of glucose level fasting, after meals and with exercise. Acknowledge that pre and post exercise glucose checks will be done for 3 sessions at entry of program.   Core Components/Risk Factors/Patient Goals Review:    Core Components/Risk Factors/Patient Goals at Discharge (Final Review):    ITP Comments:  ITP Comments    Row Name 10/26/20 1110 11/03/20 1310         ITP Comments Virtual orientation call completed today. shehas an appointment on Date: 11/03/2020 for EP eval and gym Orientation.  Documentation  of diagnosis can be found in [facility]: 06/29/2020. Completed 6MWT and gym orientation. Initial ITP created and sent for review to Dr. Mark Emily Filbertical Director.             Comments: Initial ITP

## 2020-11-04 ENCOUNTER — Telehealth: Payer: Self-pay | Admitting: *Deleted

## 2020-11-04 ENCOUNTER — Encounter: Payer: Self-pay | Admitting: *Deleted

## 2020-11-04 ENCOUNTER — Ambulatory Visit: Payer: Medicare Other

## 2020-11-04 DIAGNOSIS — I5022 Chronic systolic (congestive) heart failure: Secondary | ICD-10-CM

## 2020-11-04 NOTE — Telephone Encounter (Signed)
Bonnie Ochoa called to let us know that she has started a new job and will be working from [employer] each day.  Her current time slot of 945 will not work with her new scheduled.  She would like to wait two weeks before starting rehab to get used to her job and to see which time would work best for her.  We will touch base again in two weeks.

## 2020-11-07 ENCOUNTER — Ambulatory Visit: Payer: Medicare Other

## 2020-11-08 ENCOUNTER — Telehealth: Payer: Self-pay

## 2020-11-08 DIAGNOSIS — F319 Bipolar disorder, unspecified: Secondary | ICD-10-CM

## 2020-11-08 DIAGNOSIS — F4312 Post-traumatic stress disorder, chronic: Secondary | ICD-10-CM

## 2020-11-08 NOTE — Telephone Encounter (Signed)
Wendi pt's sister presented in office saying that pt is requesting a new referral to a new physiatry doctor. Please advise.

## 2020-11-09 ENCOUNTER — Ambulatory Visit: Payer: Medicare Other

## 2020-11-10 NOTE — Telephone Encounter (Signed)
Spoke with patient's sister [name] states the patient needs an referral for her mental health.

## 2020-11-10 NOTE — Telephone Encounter (Signed)
Please confirm physiatry (back doctor) vs psychiatry (mental health)

## 2020-11-11 ENCOUNTER — Ambulatory Visit: Payer: Medicare Other

## 2020-11-14 ENCOUNTER — Ambulatory Visit: Payer: Medicare Other

## 2020-11-15 ENCOUNTER — Encounter: Payer: Self-pay | Admitting: *Deleted

## 2020-11-15 DIAGNOSIS — I5022 Chronic systolic (congestive) heart failure: Secondary | ICD-10-CM

## 2020-11-16 ENCOUNTER — Ambulatory Visit: Payer: Medicare Other

## 2020-11-18 ENCOUNTER — Ambulatory Visit: Payer: Medicare Other

## 2020-11-21 ENCOUNTER — Ambulatory Visit: Payer: Medicare Other

## 2020-11-23 ENCOUNTER — Ambulatory Visit: Payer: Medicare Other

## 2020-11-25 ENCOUNTER — Ambulatory Visit: Payer: Medicare Other

## 2020-11-28 ENCOUNTER — Ambulatory Visit: Payer: Medicare Other

## 2020-11-30 ENCOUNTER — Ambulatory Visit: Payer: Medicare Other

## 2020-11-30 ENCOUNTER — Encounter: Payer: Self-pay | Admitting: *Deleted

## 2020-11-30 DIAGNOSIS — I5022 Chronic systolic (congestive) heart failure: Secondary | ICD-10-CM

## 2020-11-30 NOTE — Progress Notes (Signed)
Cardiac Individual Treatment Plan  Patient Details  Name: Bonnie Ochoa MRN: [id number] Date of Birth: Mar 23, 1966 Referring Provider:   Flowsheet Row Cardiac Rehab from 11/03/2020 in [facility] Cardiac and Pulmonary Rehab  Referring Provider Glori Bickers MD  [facility] Cardiologist: Dr. Ida Rogue      Initial Encounter Date:  Flowsheet Row Cardiac Rehab from 11/03/2020 in [facility] -  Cardiac and Pulmonary Rehab  Date 11/03/20      Visit Diagnosis: Chronic systolic heart failure ([place])  Patient's Home Medications on Admission:  Current Outpatient Medications:  .  blood glucose meter kit and supplies KIT, Dispense based on patient and insurance preference. Use up to four times daily as directed. (FOR ICD-9 250.00, 250.01)., Disp: 1 each, Rfl: 0 .  carvedilol (COREG) 12.5 MG tablet, Take 1 tablet (12.5 mg total) by mouth 2 (two) times daily with a meal., Disp: 180 tablet, Rfl: 3 .  cyclobenzaprine (FLEXERIL) 5 MG tablet, TAKE 1 TABLET BY MOUTH THREE TIMES A DAY AS NEEDED FOR MUSCLE SPASMS, Disp: 60 tablet, Rfl: 1 .  digoxin (LANOXIN) 0.125 MG tablet, TAKE 1 TABLET BY MOUTH DAILY, Disp: 90 tablet, Rfl: 3 .  empagliflozin (JARDIANCE) 25 MG TABS tablet, Take 1 tablet (25 mg total) by mouth daily., Disp: 90 tablet, Rfl: 1 .  famotidine (PEPCID) 40 MG tablet, TAKE 1 TABLET BY MOUTH EVERY DAY IN THE EVENING, Disp: 90 tablet, Rfl: 3 .  furosemide (LASIX) 20 MG tablet, Take 1 tablet (20 mg total) by mouth as directed. Take 46m (1 tablet) Monday, Wednesday, Friday., Disp: 40 tablet, Rfl: 3 .  insulin glargine (LANTUS SOLOSTAR) 100 UNIT/ML Solostar Pen, Inject 35 Units into the skin daily at 10 pm., Disp: 15 mL, Rfl: 3 .  Insulin Pen Needle 32G X 6 MM MISC, 1 each by Does not apply route daily., Disp: 100 each, Rfl: 12 .  lamoTRIgine (LAMICTAL) 150 MG tablet, Take 1 tablet (150 mg total) by mouth daily., Disp: 90 tablet, Rfl: 0 .  metoCLOPramide (REGLAN) 10 MG tablet, Take 10 mg by mouth 4 (four) times  daily., Disp: , Rfl:  .  pantoprazole (PROTONIX) 40 MG tablet, TAKE 1 TABLET BY MOUTH EVERY DAY, Disp: 90 tablet, Rfl: 1 .  PARoxetine (PAXIL) 30 MG tablet, Take 1 tablet (30 mg total) by mouth daily., Disp: 90 tablet, Rfl: 1 .  potassium chloride SA (KLOR-CON M20) 20 MEQ tablet, TAKE 2 TABLETS (40 MEQ TOTAL) BY MOUTH 2 (TWO) TIMES DAILY., Disp: 180 tablet, Rfl: 3 .  risperiDONE (RISPERDAL) 0.25 MG tablet, TAKE 1 TABLET (0.25 MG TOTAL) BY MOUTH EVERY MORNING., Disp: 90 tablet, Rfl: 1 .  rosuvastatin (CRESTOR) 40 MG tablet, Take 1 tablet (40 mg total) by mouth daily., Disp: 90 tablet, Rfl: 3 .  sacubitril-valsartan (ENTRESTO) 97-103 MG, Take 1 tablet by mouth 2 (two) times daily., Disp: 60 tablet, Rfl: 11 .  spironolactone (ALDACTONE) 25 MG tablet, Take 1 tablet (25 mg total) by mouth daily., Disp: 90 tablet, Rfl: 3 .  sucralfate (CARAFATE) 1 g tablet, TAKE 1 TABLET (1 G TOTAL) BY MOUTH 4 (FOUR) TIMES DAILY., Disp: 360 tablet, Rfl: 3 .  zolpidem (AMBIEN) 10 MG tablet, Take 1 tablet (10 mg total) by mouth at bedtime as needed for sleep., Disp: 21 tablet, Rfl: 0  Past Medical History: Past Medical History:  Diagnosis Date  . Abdominal pain    a. 05/2018 HIDA scan wnl.  . ADHD   . AICD (automatic cardioverter/defibrillator) present    a. 11/2014 s/p SJM Fortify  Assura, single lead AICD (ser# [id number]).  Marland Kitchen Anemia   . Arthritis   . Asthma   . Bipolar 1 disorder ([place])   . Chest pain    a. Hx of cath in [place] - reportedly nl; b. 04/2018 MV: EF 22%, fixed dist ant septal, apical, and inferoapical defects - ? scar vs. attenuation. No ischemia.  . CHF (congestive heart failure) ([place])   . Cirrhosis of liver ([place])   . Coronary artery disease   . Depression   . Diabetes mellitus without complication ([place])   . Diverticulitis   . Dysrhythmia   . Heart murmur   . HFrEF (heart failure with reduced ejection fraction) ([place])    a. 06/2017 Echo: EF 20-25%, diff HK. Mildly dil LA; b. 05/2018 Echo: EF 25-30%,  diff HK, Gr2 DD. Triv AI. Mod MR. Mildly reduced RV fxn. Mod-Sev TR. PASP 45-4mHg.  .Marland KitchenHypertension   . Hypothyroidism   . IBS (irritable bowel syndrome)   . Insomnia   . Migraines   . NICM (nonischemic cardiomyopathy) ([place]    a. EF prev 25%; b. 11/2014 s/p SJM Fortify Assura, single lead AICD (ser# [id number]; c. 06/2017 Echo: EF 20-25%; d. 05/2018 Echo: EF 25-30%, diff HK, Gr2 DD; e. 05/2018 Echo: EF 25-30%; f. 05/2018 Cath: Nl cors. LVEDP 224mg, PCWP 3246m. PA 65/40 (52). CO/CI 3.04/1.56; c. 07/2018 CPX: Mod HF limitation.  . OSA (obstructive sleep apnea)    CPAP is broken  . PTSD (post-traumatic stress disorder)   . Restless leg syndrome   . Vertigo     Tobacco Use: Social History   Tobacco Use  Smoking Status Former Smoker  . Types: Cigarettes  . Quit date: 19817 Years since quitting: 37.3  Smokeless Tobacco Never Used  Tobacco Comment   quit over  20 years ago     Labs: Recent Review Flowsheet Data    Labs for ITP Cardiac and Pulmonary Rehab Latest Ref Rng & Units 05/14/2019 09/28/2019 12/25/2019 04/06/2020 07/11/2020   Cholestrol 100 - 199 mg/dL - 214(H) - - 152   LDLCALC 0 - 99 mg/dL - 121(H) - - 84   HDL >39 mg/dL - 36(L) - - 39(L)   Trlycerides 0 - 149 mg/dL - 321(H) - - 168(H)   Hemoglobin A1c <7.0 % 12.7(H) 9.9(H) 10.1(H) 8.5(H) 6.8       Exercise Target Goals: Exercise Program Goal: Individual exercise prescription set using results from initial 6 min walk test and THRR while considering  patient's activity barriers and safety.   Exercise Prescription Goal: Initial exercise prescription builds to 30-45 minutes a day of aerobic activity, 2-3 days per week.  Home exercise guidelines will be given to patient during program as part of exercise prescription that the participant will acknowledge.   Education: Aerobic Exercise: - Group verbal and visual presentation on the components of exercise prescription. Introduces F.I.T.T principle from ACSM for exercise  prescriptions.  Reviews F.I.T.T. principles of aerobic exercise including progression. Written material given at graduation. Flowsheet [facility]  Education need identified 11/03/20      Education: Resistance Exercise: - Group verbal and visual presentation on the components of exercise prescription. Introduces F.I.T.T principle from ACSM for exercise prescriptions  Reviews F.I.T.T. principles of resistance exercise including progression. Written material given at graduation.    Education: Exercise & Equipment Safety: - Individual verbal instruction and demonstration of equipment use and safety with use of the equipment. Flowsheet  Row Cardiac Rehab from 11/03/2020 in [facility] Cardiac and Pulmonary Rehab  Date 11/03/20  Educator [facility]  Instruction Review Code 1- Verbalizes Understanding      Education: Exercise Physiology & General Exercise Guidelines: - Group verbal and written instruction with models to review the exercise physiology of the cardiovascular system and associated critical values. Provides general exercise guidelines with specific guidelines to those with heart or lung disease.    Education: Flexibility, Balance, Mind/Body Relaxation: - Group verbal and visual presentation with interactive activity on the components of exercise prescription. Introduces F.I.T.T principle from ACSM for exercise prescriptions. Reviews F.I.T.T. principles of flexibility and balance exercise training including progression. Also discusses the mind body connection.  Reviews various relaxation techniques to help reduce and manage stress (i.e. Deep breathing, progressive muscle relaxation, and visualization). Balance handout provided to take home. Written material given at graduation.   Activity Barriers & Risk Stratification:  Activity Barriers & Cardiac Risk Stratification - 11/03/20 1312      Activity Barriers & Cardiac Risk Stratification    Activity Barriers Chest Pain/Angina;Back Problems;Muscular Weakness;Deconditioning;Balance Concerns    Cardiac Risk Stratification High           6 Minute Walk:  6 Minute Walk    Row Name 11/03/20 1311         6 Minute Walk   Phase Initial     Distance 1095 feet     Walk Time 6 minutes     # of Rest Breaks 0     MPH 2.07     METS 3.1     RPE 13     VO2 Peak 10.86     Symptoms Yes (comment)     Comments R thigh pain 4/10     Resting HR 59 bpm     Resting BP 142/84     Resting Oxygen Saturation  97 %     Exercise Oxygen Saturation  during 6 min walk 99 %     Max Ex. HR 81 bpm     Max Ex. BP 154/86     2 Minute Post BP 138/80            Oxygen Initial Assessment:   Oxygen Re-Evaluation:   Oxygen Discharge (Final Oxygen Re-Evaluation):   Initial Exercise Prescription:  Initial Exercise Prescription - 11/03/20 1300      Date of Initial Exercise RX and Referring Provider   Date 11/03/20    Referring Provider Glori Bickers MD   Primary Cardiologist: Dr. Ida Rogue     Treadmill   MPH 2    Grade 1    Minutes 15    METs 2.81      Recumbant Bike   Level 2    RPM 50    Watts 28    Minutes 15    METs 2      Arm Ergometer   Level 1    Watts 25    RPM 25    Minutes 15    METs 2      REL-XR   Level 3    Speed 50    Minutes 15    METs 2      Prescription Details   Frequency (times per week) 3    Duration Progress to 30 minutes of continuous aerobic without signs/symptoms of physical distress      Intensity   THRR 40-80% of Max Heartrate 101-143    Ratings of Perceived Exertion 11-13    Perceived Dyspnea  0-4      Progression   Progression Continue to progress workloads to maintain intensity without signs/symptoms of physical distress.      Resistance Training   Training Prescription Yes    Weight 3 lb    Reps 10-15           Perform Capillary Blood Glucose checks as needed.  Exercise Prescription Changes:  Exercise  Prescription Changes    Row Name 11/03/20 1300             Response to Exercise   Blood Pressure (Admit) 142/84       Blood Pressure (Exercise) 154/86       Blood Pressure (Exit) 138/80       Heart Rate (Admit) 59 bpm       Heart Rate (Exercise) 81 bpm       Heart Rate (Exit) 70 bpm       Oxygen Saturation (Admit) 97 %       Oxygen Saturation (Exercise) 99 %       Rating of Perceived Exertion (Exercise) 13       Symptoms r thigh pain 4/10       Comments walk test results              Exercise Comments:   Exercise Goals and Review:  Exercise Goals    Row Name 11/03/20 1315             Exercise Goals   Increase Physical Activity Yes       Intervention Provide advice, education, support and counseling about physical activity/exercise needs.;Develop an individualized exercise prescription for aerobic and resistive training based on initial evaluation findings, risk stratification, comorbidities and participant's personal goals.       Expected Outcomes Short Term: Attend rehab on a regular basis to increase amount of physical activity.;Long Term: Add in home exercise to make exercise part of routine and to increase amount of physical activity.;Long Term: Exercising regularly at least 3-5 days a week.       Increase Strength and Stamina Yes       Intervention Provide advice, education, support and counseling about physical activity/exercise needs.;Develop an individualized exercise prescription for aerobic and resistive training based on initial evaluation findings, risk stratification, comorbidities and participant's personal goals.       Expected Outcomes Short Term: Increase workloads from initial exercise prescription for resistance, speed, and METs.;Long Term: Improve cardiorespiratory fitness, muscular endurance and strength as measured by increased METs and functional capacity (6MWT);Short Term: Perform resistance training exercises routinely during rehab and add in  resistance training at home       Able to understand and use rate of perceived exertion (RPE) scale Yes       Intervention Provide education and explanation on how to use RPE scale       Expected Outcomes Short Term: Able to use RPE daily in rehab to express subjective intensity level;Long Term:  Able to use RPE to guide intensity level when exercising independently       Able to understand and use Dyspnea scale Yes       Intervention Provide education and explanation on how to use Dyspnea scale       Expected Outcomes Short Term: Able to use Dyspnea scale daily in rehab to express subjective sense of shortness of breath during exertion;Long Term: Able to use Dyspnea scale to guide intensity level when exercising independently       Knowledge and understanding of  Target Heart Rate Range (THRR) Yes       Intervention Provide education and explanation of THRR including how the numbers were predicted and where they are located for reference       Expected Outcomes Short Term: Able to state/look up THRR;Short Term: Able to use daily as guideline for intensity in rehab;Long Term: Able to use THRR to govern intensity when exercising independently       Able to check pulse independently Yes       Intervention Provide education and demonstration on how to check pulse in carotid and radial arteries.;Review the importance of being able to check your own pulse for safety during independent exercise       Expected Outcomes Short Term: Able to explain why pulse checking is important during independent exercise;Long Term: Able to check pulse independently and accurately       Understanding of Exercise Prescription Yes       Intervention Provide education, explanation, and written materials on patient's individual exercise prescription       Expected Outcomes Short Term: Able to explain program exercise prescription;Long Term: Able to explain home exercise prescription to exercise independently               Exercise Goals Re-Evaluation :  Exercise Goals Re-Evaluation    Row Name 11/15/20 1610             Exercise Goal Re-Evaluation   Comments Has yet to start exercise sessions due to new job              Discharge Exercise Prescription (Final Exercise Prescription Changes):  Exercise Prescription Changes - 11/03/20 1300      Response to Exercise   Blood Pressure (Admit) 142/84    Blood Pressure (Exercise) 154/86    Blood Pressure (Exit) 138/80    Heart Rate (Admit) 59 bpm    Heart Rate (Exercise) 81 bpm    Heart Rate (Exit) 70 bpm    Oxygen Saturation (Admit) 97 %    Oxygen Saturation (Exercise) 99 %    Rating of Perceived Exertion (Exercise) 13    Symptoms r thigh pain 4/10    Comments walk test results           Nutrition:  Target Goals: Understanding of nutrition guidelines, daily intake of sodium <1569m, cholesterol <2074m calories 30% from fat and 7% or less from saturated fats, daily to have 5 or more servings of fruits and vegetables.  Education: All About Nutrition: -Group instruction provided by verbal, written material, interactive activities, discussions, models, and posters to present general guidelines for heart healthy nutrition including fat, fiber, MyPlate, the role of sodium in heart healthy nutrition, utilization of the nutrition label, and utilization of this knowledge for meal planning. Follow up email sent as well. Written material given at graduation. Flow[facility]  Education need identified 11/03/20      Biometrics:  Pre Biometrics - 11/03/20 1315      Pre Biometrics   Height 5' 5"  (1.651 m)    Weight 178 lb 3.2 oz (80.8 kg)    BMI (Calculated) 29.65    Single Leg Stand 10 seconds            Nutrition Therapy Plan and Nutrition Goals:   Nutrition Assessments:  MEDIFICTS Score Key:  ?70 Need to make dietary changes   40-70 Heart Healthy Diet  ? 40  Therapeutic Level Cholesterol Diet  Flowsheet Row Cardiac Rehab from 11/03/2020 in [facility] Cardiac and Pulmonary Rehab  Picture Your Plate Total Score on Admission 62     Picture Your Plate Scores:  <49 Unhealthy dietary pattern with much room for improvement.  41-50 Dietary pattern unlikely to meet recommendations for good health and room for improvement.  51-60 More healthful dietary pattern, with some room for improvement.   >60 Healthy dietary pattern, although there may be some specific behaviors that could be improved.    Nutrition Goals Re-Evaluation:   Nutrition Goals Discharge (Final Nutrition Goals Re-Evaluation):   Psychosocial: Target Goals: Acknowledge presence or absence of significant depression and/or stress, maximize coping skills, provide positive support system. Participant is able to verbalize types and ability to use techniques and skills needed for reducing stress and depression.   Education: Stress, Anxiety, and Depression - Group verbal and visual presentation to define topics covered.  Reviews how body is impacted by stress, anxiety, and depression.  Also discusses healthy ways to reduce stress and to treat/manage anxiety and depression.  Written material given at graduation. Flowsheet Row Cardiac Rehab from 11/03/2020 in [facility] Cardiac and Pulmonary Rehab  Education need identified 11/03/20      Education: Sleep Hygiene -Provides group verbal and written instruction about how sleep can affect your health.  Define sleep hygiene, discuss sleep cycles and impact of sleep habits. Review good sleep hygiene tips.    Initial Review & Psychosocial Screening:  Initial Psych Review & Screening - 10/26/20 1048      Initial Review   Current issues with Current Depression;Current Psychotropic Meds      Family Dynamics   Good Support System? Yes   wife Darryll Capers, sister, children live in [place]     Barriers   Psychosocial barriers to participate in program There are  no identifiable barriers or psychosocial needs.      Screening Interventions   Interventions Encouraged to exercise;To provide support and resources with identified psychosocial needs;Provide feedback about the scores to participant    Expected Outcomes Short Term goal: Utilizing psychosocial counselor, staff and physician to assist with identification of specific Stressors or current issues interfering with healing process. Setting desired goal for each stressor or current issue identified.;Long Term Goal: Stressors or current issues are controlled or eliminated.;Short Term goal: Identification and review with participant of any Quality of Life or Depression concerns found by scoring the questionnaire.;Long Term goal: The participant improves quality of Life and PHQ9 Scores as seen by post scores and/or verbalization of changes           Quality of Life Scores:   Quality of Life - 11/03/20 1315      Quality of Life   Select Quality of Life      Quality of Life Scores   Health/Function Pre 17.5 %    Socioeconomic Pre 18.56 %    Psych/Spiritual Pre 25.14 %    Family Pre 25.2 %    GLOBAL Pre 20.37 %          Scores of 19 and below usually indicate a poorer quality of life in these areas.  A difference of  2-3 points is a clinically meaningful difference.  A difference of 2-3 points in the total score of the Quality of Life Index has been associated with significant improvement in overall quality of life, self-image, physical symptoms, and general health in studies assessing change in quality of life.  PHQ-9: Recent Review Flowsheet Data    Depression screen PHQ  2/9 11/03/2020 09/09/2020 07/11/2020 09/28/2019 05/18/2019   Decreased Interest 0 0 2 0 1   Down, Depressed, Hopeless 2 0 1 0 0   PHQ - 2 Score 2 0 3 0 1   Altered sleeping 0 - 0 1 -   Tired, decreased energy 1 - 3 0 -   Change in appetite 0 - 0 0 -   Feeling bad or failure about yourself  2 - 0 0 -   Trouble concentrating 2 -  0 0 -   Moving slowly or fidgety/restless 0 - 0 0 -   Suicidal thoughts 0 - 0 0 -   PHQ-9 Score 7 - 6 1 -   Difficult doing work/chores Not difficult at all - - Not difficult at all -     Interpretation of Total Score  Total Score Depression Severity:  1-4 = Minimal depression, 5-9 = Mild depression, 10-14 = Moderate depression, 15-19 = Moderately severe depression, 20-27 = Severe depression   Psychosocial Evaluation and Intervention:  Psychosocial Evaluation - 10/26/20 1111      Psychosocial Evaluation & Interventions   Interventions Encouraged to exercise with the program and follow exercise prescription    Comments Luvenia Starch has no barriers to starting the program. She has history of depression and is under the care of physician and has Paxil prscribed. She moved to [place] in the past 5 years with he sister. SHe is married for 3 years to her wife LaTricia.  She has grown children that live in [place]. Her wife aand sister are her support system. THey all live together in a home with no pets. Her goal is to increase her energy. She should do well in the program.    Expected Outcomes STG: Luvenia Starch attends all scheduled sessions, and is able to progress her exercise prescription over time. LTG: she has gained more energy/stamina and is able to maintain this with continuing what she learned in the program. [facility] keeps her depression under control with continued meds and counseling    Continue Psychosocial Services  Follow up required by staff           Psychosocial Re-Evaluation:   Psychosocial Discharge (Final Psychosocial Re-Evaluation):   Vocational Rehabilitation: Provide vocational rehab assistance to qualifying candidates.   Vocational Rehab Evaluation & Intervention:  Vocational Rehab - 10/26/20 1053      Initial Vocational Rehab Evaluation & Intervention   Assessment shows need for Vocational Rehabilitation No           Education: Education Goals: Education classes will be provided  on a variety of topics geared toward better understanding of heart health and risk factor modification. Participant will state understanding/return demonstration of topics presented as noted by education test scores.  Learning Barriers/Preferences:  Learning Barriers/Preferences - 10/26/20 1053      Learning Barriers/Preferences   Learning Barriers None    Learning Preferences None           General Cardiac Education Topics:  AED/CPR: - Group verbal and written instruction with the use of models to demonstrate the basic use of the AED with the basic ABC's of resuscitation.   Anatomy and Cardiac Procedures: - Group verbal and visual presentation and models provide information about basic cardiac anatomy and function. Reviews the testing methods done to diagnose heart disease and the outcomes of the test results. Describes the treatment choices: Medical Management, Angioplasty, or Coronary Bypass Surgery for treating various heart conditions including Myocardial Infarction, Angina, Valve Disease, and Cardiac  Arrhythmias.  Written material given at graduation. Flowsheet Row Cardiac Rehab from 11/03/2020 in [facility] Cardiac and Pulmonary Rehab  Education need identified 11/03/20      Medication Safety: - Group verbal and visual instruction to review commonly prescribed medications for heart and lung disease. Reviews the medication, class of the drug, and side effects. Includes the steps to properly store meds and maintain the prescription regimen.  Written material given at graduation.   Intimacy: - Group verbal instruction through game format to discuss how heart and lung disease can affect sexual intimacy. Written material given at graduation..   Know Your Numbers and Heart Failure: - Group verbal and visual instruction to discuss disease risk factors for cardiac and pulmonary disease and treatment options.  Reviews associated critical values for Overweight/Obesity, Hypertension,  Cholesterol, and Diabetes.  Discusses basics of heart failure: signs/symptoms and treatments.  Introduces Heart Failure Zone chart for action plan for heart failure.  Written material given at graduation.   Infection Prevention: - Provides verbal and written material to individual with discussion of infection control including proper hand washing and proper equipment cleaning during exercise session. Flowsheet Row Cardiac Rehab from 11/03/2020 in [facility] Cardiac and Pulmonary Rehab  Date 11/03/20  Educator [facility]  Instruction Review Code 1- Verbalizes Understanding      Falls Prevention: - Provides verbal and written material to individual with discussion of falls prevention and safety. Flowsheet Row Cardiac Rehab from 11/03/2020 in [facility] Cardiac and Pulmonary Rehab  Date 10/26/20  Educator SB  Instruction Review Code 1- Verbalizes Understanding      Other: -Provides group and verbal instruction on various topics (see comments)   Knowledge Questionnaire Score:  Knowledge Questionnaire Score - 11/03/20 1317      Knowledge Questionnaire Score   Pre Score 20/26 Education Focus: Nutrition, exercise, angina, depression           Core Components/Risk Factors/Patient Goals at Admission:  Personal Goals and Risk Factors at Admission - 11/03/20 1317      Core Components/Risk Factors/Patient Goals on Admission    Weight Management Yes;Weight Maintenance    Intervention Weight Management: Develop a combined nutrition and exercise program designed to reach desired caloric intake, while maintaining appropriate intake of nutrient and fiber, sodium and fats, and appropriate energy expenditure required for the weight goal.;Weight Management: Provide education and appropriate resources to help participant work on and attain dietary goals.;Obesity: Provide education and appropriate resources to help participant work on and attain dietary goals.    Admit Weight 178 lb 3.2 oz (80.8 kg)    Goal Weight:  Short Term 173 lb (78.5 kg)    Goal Weight: Long Term 165 lb (74.8 kg)    Expected Outcomes Short Term: Continue to assess and modify interventions until short term weight is achieved;Long Term: Adherence to nutrition and physical activity/exercise program aimed toward attainment of established weight goal;Weight Loss: Understanding of general recommendations for a balanced deficit meal plan, which promotes 1-2 lb weight loss per week and includes a negative energy balance of [phone] kcal/d;Understanding recommendations for meals to include 15-35% energy as protein, 25-35% energy from fat, 35-60% energy from carbohydrates, less than 265m of dietary cholesterol, 20-35 gm of total fiber daily;Understanding of distribution of calorie intake throughout the day with the consumption of 4-5 meals/snacks    Diabetes Yes    Intervention Provide education about signs/symptoms and action to take for hypo/hyperglycemia.;Provide education about proper nutrition, including hydration, and aerobic/resistive exercise prescription along with prescribed medications to  achieve blood glucose in normal ranges: Fasting glucose 65-99 mg/dL    Expected Outcomes Short Term: Participant verbalizes understanding of the signs/symptoms and immediate care of hyper/hypoglycemia, proper foot care and importance of medication, aerobic/resistive exercise and nutrition plan for blood glucose control.;Long Term: Attainment of HbA1C < 7%.    Heart Failure Yes    Intervention Provide a combined exercise and nutrition program that is supplemented with education, support and counseling about heart failure. Directed toward relieving symptoms such as shortness of breath, decreased exercise tolerance, and extremity edema.    Expected Outcomes Improve functional capacity of life;Short term: Attendance in program 2-3 days a week with increased exercise capacity. Reported lower sodium intake. Reported increased fruit and vegetable intake. Reports  medication compliance.;Short term: Daily weights obtained and reported for increase. Utilizing diuretic protocols set by physician.;Long term: Adoption of self-care skills and reduction of barriers for early signs and symptoms recognition and intervention leading to self-care maintenance.    Hypertension Yes    Intervention Provide education on lifestyle modifcations including regular physical activity/exercise, weight management, moderate sodium restriction and increased consumption of fresh fruit, vegetables, and low fat dairy, alcohol moderation, and smoking cessation.;Monitor prescription use compliance.    Expected Outcomes Short Term: Continued assessment and intervention until BP is < 140/76m HG in hypertensive participants. < 130/879mHG in hypertensive participants with diabetes, heart failure or chronic kidney disease.;Long Term: Maintenance of blood pressure at goal levels.    Lipids Yes    Intervention Provide education and support for participant on nutrition & aerobic/resistive exercise along with prescribed medications to achieve LDL <7069mHDL >33m77m  Expected Outcomes Short Term: Participant states understanding of desired cholesterol values and is compliant with medications prescribed. Participant is following exercise prescription and nutrition guidelines.;Long Term: Cholesterol controlled with medications as prescribed, with individualized exercise RX and with personalized nutrition plan. Value goals: LDL < 70mg80mL > 40 mg.           Education:Diabetes - Individual verbal and written instruction to review signs/symptoms of diabetes, desired ranges of glucose level fasting, after meals and with exercise. Acknowledge that pre and post exercise glucose checks will be done for 3 sessions at entry of program.   Core Components/Risk Factors/Patient Goals Review:    Core Components/Risk Factors/Patient Goals at Discharge (Final Review):    ITP Comments:  ITP Comments    Row  Name 10/26/20 1110 11/03/20 1310 11/04/20 0830 11/15/20 1610 11/30/20 0622   ITP Comments Virtual orientation call completed today. shehas an appointment on Date: 11/03/2020 for EP eval and gym Orientation.  Documentation of diagnosis can be found in [facility]  [facility]: 06/29/2020. Completed 6MWT and gym orientation. Initial ITP created and sent for review to Dr. Mark Emily Filbertical Director. Jade Luvenia Starched to let us knKorea that she has started a new job and will be working from 9-5 [employer] day.  Her current time slot of 945 will not work with her new scheduled.  She would like to wait two weeks before starting rehab to get used to her job and to see which time would work best for her.  We will touch base again in two weeks. Waiting to hear back from new job info. 30 Day review completed. Medical Director ITP review done, changes made as directed, and signed approval by Medical Director.No visits in April          Comments:

## 2020-12-01 ENCOUNTER — Telehealth: Payer: Self-pay

## 2020-12-01 NOTE — Telephone Encounter (Signed)
Pt's sister Bonnie Ochoa presented in office stating pt is wanting to have a referral for water therapy for physical therapy. Please advise.

## 2020-12-01 NOTE — Telephone Encounter (Signed)
Would need appt.

## 2020-12-01 NOTE — Telephone Encounter (Signed)
scheduled

## 2020-12-02 ENCOUNTER — Ambulatory Visit: Payer: Medicare Other | Admitting: Psychiatry

## 2020-12-02 ENCOUNTER — Other Ambulatory Visit: Payer: Self-pay

## 2020-12-02 ENCOUNTER — Ambulatory Visit: Payer: Medicare Other

## 2020-12-05 ENCOUNTER — Ambulatory Visit: Payer: Medicare Other

## 2020-12-07 ENCOUNTER — Ambulatory Visit: Payer: Medicare Other

## 2020-12-08 ENCOUNTER — Ambulatory Visit: Payer: Medicare Other | Admitting: Family Medicine

## 2020-12-08 ENCOUNTER — Telehealth: Payer: Self-pay

## 2020-12-08 NOTE — Telephone Encounter (Signed)
LMOM

## 2020-12-08 NOTE — Telephone Encounter (Signed)
Client returned call and plans to return to class next week

## 2020-12-09 ENCOUNTER — Ambulatory Visit: Payer: Medicare Other

## 2020-12-09 ENCOUNTER — Other Ambulatory Visit: Payer: Self-pay | Admitting: Psychiatry

## 2020-12-09 DIAGNOSIS — F5105 Insomnia due to other mental disorder: Secondary | ICD-10-CM

## 2020-12-12 ENCOUNTER — Encounter: Payer: Medicare Other | Attending: Internal Medicine

## 2020-12-12 ENCOUNTER — Other Ambulatory Visit: Payer: Self-pay

## 2020-12-12 ENCOUNTER — Ambulatory Visit: Payer: Medicare Other

## 2020-12-12 DIAGNOSIS — I5022 Chronic systolic (congestive) heart failure: Secondary | ICD-10-CM | POA: Diagnosis not present

## 2020-12-12 LAB — GLUCOSE, CAPILLARY
Glucose-Capillary: 86 mg/dL (ref 70–99)
Glucose-Capillary: 92 mg/dL (ref 70–99)

## 2020-12-12 NOTE — Progress Notes (Signed)
Incomplete Session Note  Patient Details  Name: Bonnie Ochoa MRN: [id number] Date of Birth: 03-27-1966 Referring Provider:   Flowsheet Row Cardiac Rehab from 11/03/2020 in [facility] Cardiac and Pulmonary Rehab  Referring Provider Arvilla Meres MD  [facility] Cardiologist: Dr. Julien Nordmann      [name] did not complete her rehab session.  Pts BG 92 on arrival pt given glucose tabs and recheck 86, pt given crackers and advised to eat before exercise session Wednesday.

## 2020-12-14 ENCOUNTER — Ambulatory Visit: Payer: Medicare Other

## 2020-12-14 ENCOUNTER — Other Ambulatory Visit: Payer: Self-pay

## 2020-12-14 DIAGNOSIS — I5022 Chronic systolic (congestive) heart failure: Secondary | ICD-10-CM | POA: Diagnosis not present

## 2020-12-14 LAB — GLUCOSE, CAPILLARY
Glucose-Capillary: 168 mg/dL — ABNORMAL HIGH (ref 70–99)
Glucose-Capillary: 148 mg/dL — ABNORMAL HIGH (ref 70–99)

## 2020-12-14 NOTE — Progress Notes (Signed)
Daily Session Note  Patient Details  Name: Bonnie Ochoa MRN: [id number] Date of Birth: 01-09-1966 Referring Provider:   Flowsheet Row Cardiac Rehab from 11/03/2020 in [facility] Cardiac and Pulmonary Rehab  Referring Provider Glori Bickers MD  [facility] Cardiologist: Dr. Ida Rogue      Encounter Date: 12/14/2020  Check In:  Session Check In - 12/14/20 1101      Check-In   Supervising physician immediately available to respond to emergencies See telemetry face sheet for immediately available ER MD    Location [facility]-Cardiac & Pulmonary Rehab    Staff Present Birdie Sons, MPA, RN;Joseph Darrin Nipper, MA, RCEP, CCRP, CCET    Virtual Visit No    Medication changes reported     No    Fall or balance concerns reported    No    Warm-up and Cool-down Performed on first and last piece of equipment    Resistance Training Performed Yes    VAD Patient? No    PAD/SET Patient? No      Pain Assessment   Currently in Pain? No/denies              Social History   Tobacco Use  Smoking Status Former Smoker  . Types: Cigarettes  . Quit date: 84  . Years since quitting: 37.3  Smokeless Tobacco Never Used  Tobacco Comment   quit over  20 years ago     Goals Met:  Independence with exercise equipment Exercise tolerated well No report of cardiac concerns or symptoms Strength training completed today  Goals Unmet:  Not Applicable  Comments: First full day of exercise!  Patient was oriented to gym and equipment including functions, settings, policies, and procedures.  Patient's individual exercise prescription and treatment plan were reviewed.  All starting workloads were established based on the results of the 6 minute walk test done at initial orientation visit.  The plan for exercise progression was also introduced and progression will be customized based on patient's performance and goals.    Dr. Emily Filbert is Medical Director for [place] and [facility].

## 2020-12-16 ENCOUNTER — Encounter: Payer: Medicare Other | Admitting: *Deleted

## 2020-12-16 ENCOUNTER — Ambulatory Visit: Payer: Medicare Other

## 2020-12-16 ENCOUNTER — Other Ambulatory Visit: Payer: Self-pay

## 2020-12-16 DIAGNOSIS — I5022 Chronic systolic (congestive) heart failure: Secondary | ICD-10-CM

## 2020-12-16 LAB — GLUCOSE, CAPILLARY
Glucose-Capillary: 295 mg/dL — ABNORMAL HIGH (ref 70–99)
Glucose-Capillary: 248 mg/dL — ABNORMAL HIGH (ref 70–99)

## 2020-12-16 NOTE — Progress Notes (Signed)
Daily Session Note  Patient Details  Name: Bonnie Ochoa MRN: [id number] Date of Birth: 11/12/1965 Referring Provider:   Flowsheet Row Cardiac Rehab from 11/03/2020 in [facility] Cardiac and Pulmonary Rehab  Referring Provider Glori Bickers MD  [facility] Cardiologist: Dr. Ida Rogue      Encounter Date: 12/16/2020  Check In:  Session Check In - 12/16/20 1032      Check-In   Supervising physician immediately available to respond to emergencies See telemetry face sheet for immediately available ER MD    Location [facility]-Cardiac & Pulmonary Rehab    Staff Present Heath Lark, RN, BSN, CCRP;Jessica Stoutland, MA, RCEP, CCRP, CCET;Joseph [place] RCP,RRT,BSRT    Virtual Visit No    Medication changes reported     No    Fall or balance concerns reported    No    Warm-up and Cool-down Performed on first and last piece of equipment    Resistance Training Performed Yes    VAD Patient? No    PAD/SET Patient? No      Pain Assessment   Currently in Pain? No/denies              Social History   Tobacco Use  Smoking Status Former Smoker  . Types: Cigarettes  . Quit date: 56  . Years since quitting: 37.3  Smokeless Tobacco Never Used  Tobacco Comment   quit over  20 years ago     Goals Met:  Exercise tolerated well No report of cardiac concerns or symptoms  Goals Unmet:  Not Applicable  Comments: Pt able to follow exercise prescription today without complaint.  Will continue to monitor for progression.     Dr. Emily Filbert is Medical Director for [place] and [facility].

## 2020-12-19 ENCOUNTER — Ambulatory Visit: Payer: Medicare Other

## 2020-12-19 ENCOUNTER — Other Ambulatory Visit: Payer: Self-pay

## 2020-12-19 ENCOUNTER — Encounter: Payer: Medicare Other | Admitting: *Deleted

## 2020-12-19 DIAGNOSIS — I5022 Chronic systolic (congestive) heart failure: Secondary | ICD-10-CM

## 2020-12-19 NOTE — Progress Notes (Signed)
Daily Session Note  Patient Details  Name: Bonnie Ochoa MRN: [id number] Date of Birth: Jul 21, 1966 Referring Provider:   Flowsheet Row Cardiac Rehab from 11/03/2020 in [facility] Cardiac and Pulmonary Rehab  Referring Provider Glori Bickers MD  [facility] Cardiologist: Dr. Ida Rogue      Encounter Date: 12/19/2020  Check In:  Session Check In - 12/19/20 1023      Check-In   Supervising physician immediately available to respond to emergencies See telemetry face sheet for immediately available ER MD    Location [facility]-Cardiac & Pulmonary Rehab    Staff Present Renita Papa, RN BSN;Joseph [address] [place], [place], ACSM CEP, Exercise Physiologist    Virtual Visit No    Medication changes reported     No    Fall or balance concerns reported    No    Warm-up and Cool-down Performed on first and last piece of equipment    Resistance Training Performed Yes    VAD Patient? No    PAD/SET Patient? No      Pain Assessment   Currently in Pain? No/denies              Social History   Tobacco Use  Smoking Status Former Smoker  . Types: Cigarettes  . Quit date: 60  . Years since quitting: 37.3  Smokeless Tobacco Never Used  Tobacco Comment   quit over  20 years ago     Goals Met:  Independence with exercise equipment Exercise tolerated well No report of cardiac concerns or symptoms Strength training completed today  Goals Unmet:  Not Applicable  Comments: Pt able to follow exercise prescription today without complaint.  Will continue to monitor for progression.    Dr. Emily Filbert is Medical Director for [place] and [facility].

## 2020-12-21 ENCOUNTER — Ambulatory Visit: Payer: Medicare Other

## 2020-12-21 ENCOUNTER — Other Ambulatory Visit: Payer: Self-pay

## 2020-12-21 DIAGNOSIS — I5022 Chronic systolic (congestive) heart failure: Secondary | ICD-10-CM

## 2020-12-21 NOTE — Progress Notes (Signed)
Daily Session Note  Patient Details  Name: Bonnie Ochoa MRN: [id number] Date of Birth: 1965-10-16 Referring Provider:   Flowsheet Row Cardiac Rehab from 11/03/2020 in [facility] Cardiac and Pulmonary Rehab  Referring Provider Glori Bickers MD  [facility] Cardiologist: Dr. Ida Rogue      Encounter Date: 12/21/2020  Check In:  Session Check In - 12/21/20 1108      Check-In   Supervising physician immediately available to respond to emergencies See telemetry face sheet for immediately available ER MD    Location [facility]-Cardiac & Pulmonary Rehab    Staff Present Birdie Sons, MPA, RN;Amanda Sommer, BA, ACSM CEP, Exercise Physiologist;Jessica Luan Pulling, MA, RCEP, CCRP, CCET    Virtual Visit No    Medication changes reported     No    Fall or balance concerns reported    No    Warm-up and Cool-down Performed on first and last piece of equipment    Resistance Training Performed Yes    VAD Patient? No    PAD/SET Patient? No      Pain Assessment   Currently in Pain? No/denies              Social History   Tobacco Use  Smoking Status Former Smoker  . Types: Cigarettes  . Quit date: 69  . Years since quitting: 37.3  Smokeless Tobacco Never Used  Tobacco Comment   quit over  20 years ago     Goals Met:  Independence with exercise equipment Exercise tolerated well Personal goals reviewed No report of cardiac concerns or symptoms Strength training completed today  Goals Unmet:  Not Applicable  Comments: Pt able to follow exercise prescription today without complaint.  Will continue to monitor for progression.    Dr. Emily Filbert is Medical Director for [place] and [facility].

## 2020-12-23 ENCOUNTER — Encounter: Payer: Medicare Other | Admitting: *Deleted

## 2020-12-23 ENCOUNTER — Other Ambulatory Visit: Payer: Self-pay

## 2020-12-23 ENCOUNTER — Ambulatory Visit: Payer: Medicare Other

## 2020-12-23 DIAGNOSIS — I5022 Chronic systolic (congestive) heart failure: Secondary | ICD-10-CM | POA: Diagnosis not present

## 2020-12-23 NOTE — Progress Notes (Signed)
Daily Session Note  Patient Details  Name: Bonnie Ochoa MRN: [id number] Date of Birth: 13-Jan-1966 Referring Provider:   Flowsheet Row Cardiac Rehab from 11/03/2020 in [facility] Cardiac and Pulmonary Rehab  Referring Provider Glori Bickers MD  [facility] Cardiologist: Dr. Ida Rogue      Encounter Date: 12/23/2020  Check In:  Session Check In - 12/23/20 1041      Check-In   Supervising physician immediately available to respond to emergencies See telemetry face sheet for immediately available ER MD    Location [facility]-Cardiac & Pulmonary Rehab    Staff Present Heath Lark, RN, BSN, CCRP;Jessica [place], MA, RCEP, CCRP, CCET;Melissa [place] RDN, LDN    Virtual Visit No    Medication changes reported     No    Fall or balance concerns reported    No    Warm-up and Cool-down Performed on first and last piece of equipment    Resistance Training Performed Yes    VAD Patient? No    PAD/SET Patient? No      Pain Assessment   Currently in Pain? No/denies              Social History   Tobacco Use  Smoking Status Former Smoker  . Types: Cigarettes  . Quit date: 30  . Years since quitting: 37.3  Smokeless Tobacco Never Used  Tobacco Comment   quit over  20 years ago     Goals Met:  Independence with exercise equipment Exercise tolerated well No report of cardiac concerns or symptoms  Goals Unmet:  Not Applicable  Comments: Pt able to follow exercise prescription today without complaint.  Will continue to monitor for progression.    Dr. Emily Filbert is Medical Director for [place] and [facility].

## 2020-12-26 ENCOUNTER — Ambulatory Visit: Payer: Medicare Other

## 2020-12-26 ENCOUNTER — Encounter: Payer: Medicare Other | Admitting: *Deleted

## 2020-12-26 ENCOUNTER — Other Ambulatory Visit: Payer: Self-pay

## 2020-12-26 ENCOUNTER — Other Ambulatory Visit: Payer: Self-pay | Admitting: Family Medicine

## 2020-12-26 DIAGNOSIS — I5022 Chronic systolic (congestive) heart failure: Secondary | ICD-10-CM

## 2020-12-26 NOTE — Progress Notes (Signed)
Daily Session Note  Patient Details  Name: Bonnie Ochoa MRN: [id number] Date of Birth: March 16, 1966 Referring Provider:   Flowsheet Row Cardiac Rehab from 11/03/2020 in [facility] Cardiac and Pulmonary Rehab  Referring Provider Glori Bickers MD  [facility] Cardiologist: Dr. Ida Rogue      Encounter Date: 12/26/2020  Check In:  Session Check In - 12/26/20 1015      Check-In   Supervising physician immediately available to respond to emergencies See telemetry face sheet for immediately available ER MD    Location [facility]-Cardiac & Pulmonary Rehab    Staff Present Renita Papa, RN BSN;Joseph [address] [place], [place], ACSM CEP, Exercise Physiologist    Virtual Visit No    Medication changes reported     No    Fall or balance concerns reported    No    Warm-up and Cool-down Performed on first and last piece of equipment    Resistance Training Performed Yes    VAD Patient? No    PAD/SET Patient? No      Pain Assessment   Currently in Pain? No/denies              Social History   Tobacco Use  Smoking Status Former Smoker  . Types: Cigarettes  . Quit date: 15  . Years since quitting: 37.3  Smokeless Tobacco Never Used  Tobacco Comment   quit over  20 years ago     Goals Met:  Independence with exercise equipment Exercise tolerated well No report of cardiac concerns or symptoms Strength training completed today  Goals Unmet:  Not Applicable  Comments: Pt able to follow exercise prescription today without complaint.  Will continue to monitor for progression.    Dr. Emily Filbert is Medical Director for [place] and [facility].

## 2020-12-27 ENCOUNTER — Encounter: Payer: Medicare Other | Admitting: Internal Medicine

## 2020-12-27 ENCOUNTER — Other Ambulatory Visit: Payer: Self-pay | Admitting: Psychiatry

## 2020-12-27 DIAGNOSIS — I428 Other cardiomyopathies: Secondary | ICD-10-CM

## 2020-12-27 DIAGNOSIS — F3131 Bipolar disorder, current episode depressed, mild: Secondary | ICD-10-CM

## 2020-12-28 ENCOUNTER — Encounter: Payer: Self-pay | Admitting: *Deleted

## 2020-12-28 ENCOUNTER — Other Ambulatory Visit: Payer: Self-pay

## 2020-12-28 ENCOUNTER — Ambulatory Visit: Payer: Medicare Other

## 2020-12-28 DIAGNOSIS — I5022 Chronic systolic (congestive) heart failure: Secondary | ICD-10-CM

## 2020-12-28 NOTE — Progress Notes (Signed)
Daily Session Note  Patient Details  Name: Bonnie Ochoa MRN: [id number] Date of Birth: March 09, 1966 Referring Provider:   Flowsheet Row Cardiac Rehab from 11/03/2020 in [facility] Cardiac and Pulmonary Rehab  Referring Provider Glori Bickers MD  [facility] Cardiologist: Dr. Ida Rogue      Encounter Date: 12/28/2020  Check In:  Session Check In - 12/28/20 0953      Check-In   Supervising physician immediately available to respond to emergencies See telemetry face sheet for immediately available ER MD    Location [facility]-Cardiac & Pulmonary Rehab    Staff Present Birdie Sons, MPA, Elveria Rising, BA, ACSM CEP, Exercise Physiologist;Joseph Tessie Fass RCP,RRT,BSRT    Virtual Visit No    Medication changes reported     No    Fall or balance concerns reported    No    Warm-up and Cool-down Performed on first and last piece of equipment    Resistance Training Performed Yes    VAD Patient? No    PAD/SET Patient? No      Pain Assessment   Currently in Pain? No/denies              Social History   Tobacco Use  Smoking Status Former Smoker  . Types: Cigarettes  . Quit date: 73  . Years since quitting: 37.4  Smokeless Tobacco Never Used  Tobacco Comment   quit over  20 years ago     Goals Met:  Independence with exercise equipment Exercise tolerated well No report of cardiac concerns or symptoms Strength training completed today  Goals Unmet:  Not Applicable  Comments: Pt able to follow exercise prescription today without complaint.  Will continue to monitor for progression.    Dr. Emily Filbert is Medical Director for [place] and [facility].

## 2020-12-28 NOTE — Progress Notes (Signed)
Cardiac Individual Treatment Plan  Patient Details  Name: Bonnie Ochoa MRN: [id number] Date of Birth: [birth year] Referring Provider:   Flowsheet Row Cardiac Rehab from 11/03/2020 in [facility] Cardiac and Pulmonary Rehab  Referring Provider Glori Bickers MD  [facility] Cardiologist: Dr. Ida Rogue      Initial Encounter Date:  Flowsheet Row Cardiac Rehab from 11/03/2020 in [facility] Cardiac and Pulmonary Rehab  Date 11/03/20      Visit Diagnosis: Chronic systolic heart failure ([place])  Patient's Home Medications on Admission:  Current Outpatient Medications:  .  blood glucose meter kit and supplies KIT, Dispense based on patient and insurance preference. Use up to four times daily as directed. (FOR ICD-9 250.00, 250.01)., Disp: 1 each, Rfl: 0 .  carvedilol (COREG) 12.5 MG tablet, Take 1 tablet (12.5 mg total) by mouth 2 (two) times daily with a meal., Disp: 180 tablet, Rfl: 3 .  cyclobenzaprine (FLEXERIL) 5 MG tablet, TAKE 1 TABLET BY MOUTH THREE TIMES A DAY AS NEEDED FOR MUSCLE SPASMS, Disp: 60 tablet, Rfl: 1 .  digoxin (LANOXIN) 0.125 MG tablet, TAKE 1 TABLET BY MOUTH DAILY, Disp: 90 tablet, Rfl: 3 .  empagliflozin (JARDIANCE) 25 MG TABS tablet, Take 1 tablet (25 mg total) by mouth daily., Disp: 90 tablet, Rfl: 1 .  famotidine (PEPCID) 40 MG tablet, TAKE 1 TABLET BY MOUTH EVERY DAY IN THE EVENING, Disp: 90 tablet, Rfl: 0 .  furosemide (LASIX) 20 MG tablet, Take 1 tablet (20 mg total) by mouth as directed. Take 63m (1 tablet) Monday, Wednesday, Friday., Disp: 40 tablet, Rfl: 3 .  insulin glargine (LANTUS SOLOSTAR) 100 UNIT/ML Solostar Pen, Inject 35 Units into the skin daily at 10 pm., Disp: 15 mL, Rfl: 3 .  Insulin Pen Needle 32G X 6 MM MISC, 1 each by Does not apply route daily., Disp: 100 each, Rfl: 12 .  lamoTRIgine (LAMICTAL) 150 MG tablet, TAKE 1 TABLET BY MOUTH EVERY DAY, Disp: 30 tablet, Rfl: 0 .  metoCLOPramide (REGLAN) 10 MG tablet, Take 10 mg by mouth 4 (four) times daily., Disp:  , Rfl:  .  pantoprazole (PROTONIX) 40 MG tablet, TAKE 1 TABLET BY MOUTH EVERY DAY, Disp: 90 tablet, Rfl: 1 .  PARoxetine (PAXIL) 30 MG tablet, Take 1 tablet (30 mg total) by mouth daily., Disp: 90 tablet, Rfl: 1 .  potassium chloride SA (KLOR-CON M20) 20 MEQ tablet, TAKE 2 TABLETS (40 MEQ TOTAL) BY MOUTH 2 (TWO) TIMES DAILY., Disp: 180 tablet, Rfl: 3 .  risperiDONE (RISPERDAL) 0.25 MG tablet, TAKE 1 TABLET (0.25 MG TOTAL) BY MOUTH EVERY MORNING., Disp: 90 tablet, Rfl: 1 .  rosuvastatin (CRESTOR) 40 MG tablet, Take 1 tablet (40 mg total) by mouth daily., Disp: 90 tablet, Rfl: 3 .  sacubitril-valsartan (ENTRESTO) 97-103 MG, Take 1 tablet by mouth 2 (two) times daily., Disp: 60 tablet, Rfl: 11 .  spironolactone (ALDACTONE) 25 MG tablet, Take 1 tablet (25 mg total) by mouth daily., Disp: 90 tablet, Rfl: 3 .  sucralfate (CARAFATE) 1 g tablet, TAKE 1 TABLET (1 G TOTAL) BY MOUTH 4 (FOUR) TIMES DAILY., Disp: 360 tablet, Rfl: 3 .  zolpidem (AMBIEN) 10 MG tablet, TAKE 1 TABLET BY MOUTH AT BEDTIME AS NEEDED FOR SLEEP., Disp: 21 tablet, Rfl: 0  Past Medical History: Past Medical History:  Diagnosis Date  . Abdominal pain    a. 05/2018 HIDA scan wnl.  . ADHD   . AICD (automatic cardioverter/defibrillator) present    a. 11/2014 s/p SJM Fortify Assura, single lead AICD (ser#  [id number]).  Marland Kitchen Anemia   . Arthritis   . Asthma   . Bipolar 1 disorder ([place])   . Chest pain    a. Hx of cath in [place] - reportedly nl; b. 04/2018 MV: EF 22%, fixed dist ant septal, apical, and inferoapical defects - ? scar vs. attenuation. No ischemia.  . CHF (congestive heart failure) ([place])   . Cirrhosis of liver ([place])   . Coronary artery disease   . Depression   . Diabetes mellitus without complication ([place])   . Diverticulitis   . Dysrhythmia   . Heart murmur   . HFrEF (heart failure with reduced ejection fraction) ([place])    a. 06/2017 Echo: EF 20-25%, diff HK. Mildly dil LA; b. 05/2018 Echo: EF 25-30%, diff HK, Gr2 DD. Triv AI. Mod  MR. Mildly reduced RV fxn. Mod-Sev TR. PASP 45-75mHg.  .Marland KitchenHypertension   . Hypothyroidism   . IBS (irritable bowel syndrome)   . Insomnia   . Migraines   . NICM (nonischemic cardiomyopathy) ([place]    a. EF prev 25%; b. 11/2014 s/p SJM Fortify Assura, single lead AICD (ser# [id number]; c. 06/2017 Echo: EF 20-25%; d. 05/2018 Echo: EF 25-30%, diff HK, Gr2 DD; e. 05/2018 Echo: EF 25-30%; f. 05/2018 Cath: Nl cors. LVEDP 266mg, PCWP 3224m. PA 65/40 (52). CO/CI 3.04/1.56; c. 07/2018 CPX: Mod HF limitation.  . OSA (obstructive sleep apnea)    CPAP is broken  . PTSD (post-traumatic stress disorder)   . Restless leg syndrome   . Vertigo     Tobacco Use: Social History   Tobacco Use  Smoking Status Former Smoker  . Types: Cigarettes  . Quit date: 19875 Years since quitting: 37.4  Smokeless Tobacco Never Used  Tobacco Comment   quit over  20 years ago     Labs: Recent Review Flowsheet Data    Labs for ITP Cardiac and Pulmonary Rehab Latest Ref Rng & Units 05/14/2019 09/28/2019 12/25/2019 04/06/2020 07/11/2020   Cholestrol 100 - 199 mg/dL - 214(H) - - 152   LDLCALC 0 - 99 mg/dL - 121(H) - - 84   HDL >39 mg/dL - 36(L) - - 39(L)   Trlycerides 0 - 149 mg/dL - 321(H) - - 168(H)   Hemoglobin A1c <7.0 % 12.7(H) 9.9(H) 10.1(H) 8.5(H) 6.8       Exercise Target Goals: Exercise Program Goal: Individual exercise prescription set using results from initial 6 min walk test and THRR while considering  patient's activity barriers and safety.   Exercise Prescription Goal: Initial exercise prescription builds to 30-45 minutes a day of aerobic activity, 2-3 days per week.  Home exercise guidelines will be given to patient during program as part of exercise prescription that the participant will acknowledge.   Education: Aerobic Exercise: - Group verbal and visual presentation on the components of exercise prescription. Introduces F.I.T.T principle from ACSM for exercise prescriptions.  Reviews F.I.T.T.  principles of aerobic exercise including progression. Written material given at graduation. Flowsheet [facility]  Education need identified 11/03/20      Education: Resistance Exercise: - Group verbal and visual presentation on the components of exercise prescription. Introduces F.I.T.T principle from ACSM for exercise prescriptions  Reviews F.I.T.T. principles of resistance exercise including progression. Written material given at graduation.    Education: Exercise & Equipment Safety: - Individual verbal instruction and demonstration of equipment use and safety with use of the equipment. Flowsheet Row Cardiac Rehab from 12/14/2020  in [facility] Cardiac and Pulmonary Rehab  Date 11/03/20  Educator [facility]  Instruction Review Code 1- Verbalizes Understanding      Education: Exercise Physiology & General Exercise Guidelines: - Group verbal and written instruction with models to review the exercise physiology of the cardiovascular system and associated critical values. Provides general exercise guidelines with specific guidelines to those with heart or lung disease.    Education: Flexibility, Balance, Mind/Body Relaxation: - Group verbal and visual presentation with interactive activity on the components of exercise prescription. Introduces F.I.T.T principle from ACSM for exercise prescriptions. Reviews F.I.T.T. principles of flexibility and balance exercise training including progression. Also discusses the mind body connection.  Reviews various relaxation techniques to help reduce and manage stress (i.e. Deep breathing, progressive muscle relaxation, and visualization). Balance handout provided to take home. Written material given at graduation. Flowsheet Row Cardiac Rehab from 12/14/2020 in [facility] Cardiac and Pulmonary Rehab  Date 12/14/20  Educator AS  Instruction Review Code 1- Verbalizes Understanding      Activity Barriers & Risk  Stratification:  Activity Barriers & Cardiac Risk Stratification - 11/03/20 1312      Activity Barriers & Cardiac Risk Stratification   Activity Barriers Chest Pain/Angina;Back Problems;Muscular Weakness;Deconditioning;Balance Concerns    Cardiac Risk Stratification High           6 Minute Walk:  6 Minute Walk    Row Name 11/03/20 1311         6 Minute Walk   Phase Initial     Distance 1095 feet     Walk Time 6 minutes     # of Rest Breaks 0     MPH 2.07     METS 3.1     RPE 13     VO2 Peak 10.86     Symptoms Yes (comment)     Comments R thigh pain 4/10     Resting HR 59 bpm     Resting BP 142/84     Resting Oxygen Saturation  97 %     Exercise Oxygen Saturation  during 6 min walk 99 %     Max Ex. HR 81 bpm     Max Ex. BP 154/86     2 Minute Post BP 138/80            Oxygen Initial Assessment:   Oxygen Re-Evaluation:   Oxygen Discharge (Final Oxygen Re-Evaluation):   Initial Exercise Prescription:  Initial Exercise Prescription - 11/03/20 1300      Date of Initial Exercise RX and Referring Provider   Date 11/03/20    Referring Provider Glori Bickers MD   Primary Cardiologist: Dr. Ida Rogue     Treadmill   MPH 2    Grade 1    Minutes 15    METs 2.81      Recumbant Bike   Level 2    RPM 50    Watts 28    Minutes 15    METs 2      Arm Ergometer   Level 1    Watts 25    RPM 25    Minutes 15    METs 2      REL-XR   Level 3    Speed 50    Minutes 15    METs 2      Prescription Details   Frequency (times per week) 3    Duration Progress to 30 minutes of continuous aerobic without signs/symptoms of physical distress  Intensity   THRR 40-80% of Max Heartrate 101-143    Ratings of Perceived Exertion 11-13    Perceived Dyspnea 0-4      Progression   Progression Continue to progress workloads to maintain intensity without signs/symptoms of physical distress.      Resistance Training   Training Prescription Yes    Weight  3 lb    Reps 10-15           Perform Capillary Blood Glucose checks as needed.  Exercise Prescription Changes:  Exercise Prescription Changes    Row Name 11/03/20 1300 12/21/20 1100 12/26/20 1100         Response to Exercise   Blood Pressure (Admit) 142/84 -- 122/72     Blood Pressure (Exercise) 154/86 -- 118/70     Blood Pressure (Exit) 138/80 -- 108/62     Heart Rate (Admit) 59 bpm -- 85 bpm     Heart Rate (Exercise) 81 bpm -- 88 bpm     Heart Rate (Exit) 70 bpm -- 85 bpm     Oxygen Saturation (Admit) 97 % -- --     Oxygen Saturation (Exercise) 99 % -- --     Rating of Perceived Exertion (Exercise) 13 -- 15     Symptoms r thigh pain 4/10 -- none     Comments walk test results -- --     Duration -- -- Progress to 30 minutes of  aerobic without signs/symptoms of physical distress     Intensity -- -- THRR unchanged           Progression   Progression -- -- Continue to progress workloads to maintain intensity without signs/symptoms of physical distress.     Average METs -- -- 3           Resistance Training   Training Prescription -- -- Yes     Weight -- -- 3 lb     Reps -- -- 10-15           Interval Training   Interval Training -- -- No           Arm Ergometer   Level -- -- 1     Minutes -- -- 15     METs -- -- 2.55           Track   Laps -- -- 45     METs -- -- 3.4           Home Exercise Plan   Plans to continue exercise at -- Home (comment)  walking Home (comment)  walking     Frequency -- Add 2 additional days to program exercise sessions. Add 2 additional days to program exercise sessions.     Initial Home Exercises Provided -- 12/21/20 12/21/20            Exercise Comments:  Exercise Comments    Row Name 12/14/20 1102           Exercise Comments First full day of exercise!  Patient was oriented to gym and equipment including functions, settings, policies, and procedures.  Patient's individual exercise prescription and treatment plan were  reviewed.  All starting workloads were established based on the results of the 6 minute walk test done at initial orientation visit.  The plan for exercise progression was also introduced and progression will be customized based on patient's performance and goals.              Exercise Goals and Review:  Exercise  Goals    Row Name 11/03/20 1315             Exercise Goals   Increase Physical Activity Yes       Intervention Provide advice, education, support and counseling about physical activity/exercise needs.;Develop an individualized exercise prescription for aerobic and resistive training based on initial evaluation findings, risk stratification, comorbidities and participant's personal goals.       Expected Outcomes Short Term: Attend rehab on a regular basis to increase amount of physical activity.;Long Term: Add in home exercise to make exercise part of routine and to increase amount of physical activity.;Long Term: Exercising regularly at least 3-5 days a week.       Increase Strength and Stamina Yes       Intervention Provide advice, education, support and counseling about physical activity/exercise needs.;Develop an individualized exercise prescription for aerobic and resistive training based on initial evaluation findings, risk stratification, comorbidities and participant's personal goals.       Expected Outcomes Short Term: Increase workloads from initial exercise prescription for resistance, speed, and METs.;Long Term: Improve cardiorespiratory fitness, muscular endurance and strength as measured by increased METs and functional capacity (6MWT);Short Term: Perform resistance training exercises routinely during rehab and add in resistance training at home       Able to understand and use rate of perceived exertion (RPE) scale Yes       Intervention Provide education and explanation on how to use RPE scale       Expected Outcomes Short Term: Able to use RPE daily in rehab to express  subjective intensity level;Long Term:  Able to use RPE to guide intensity level when exercising independently       Able to understand and use Dyspnea scale Yes       Intervention Provide education and explanation on how to use Dyspnea scale       Expected Outcomes Short Term: Able to use Dyspnea scale daily in rehab to express subjective sense of shortness of breath during exertion;Long Term: Able to use Dyspnea scale to guide intensity level when exercising independently       Knowledge and understanding of Target Heart Rate Range (THRR) Yes       Intervention Provide education and explanation of THRR including how the numbers were predicted and where they are located for reference       Expected Outcomes Short Term: Able to state/look up THRR;Short Term: Able to use daily as guideline for intensity in rehab;Long Term: Able to use THRR to govern intensity when exercising independently       Able to check pulse independently Yes       Intervention Provide education and demonstration on how to check pulse in carotid and radial arteries.;Review the importance of being able to check your own pulse for safety during independent exercise       Expected Outcomes Short Term: Able to explain why pulse checking is important during independent exercise;Long Term: Able to check pulse independently and accurately       Understanding of Exercise Prescription Yes       Intervention Provide education, explanation, and written materials on patient's individual exercise prescription       Expected Outcomes Short Term: Able to explain program exercise prescription;Long Term: Able to explain home exercise prescription to exercise independently              Exercise Goals Re-Evaluation :  Exercise Goals Re-Evaluation    Row Name 11/15/20 1610 12/14/20  1102 12/21/20 1125 12/26/20 1134       Exercise Goal Re-Evaluation   Exercise Goals Review -- Increase Physical Activity;Understanding of Exercise  Prescription;Knowledge and understanding of Target Heart Rate Range (THRR);Able to understand and use rate of perceived exertion (RPE) scale;Increase Strength and Stamina;Able to understand and use Dyspnea scale;Able to check pulse independently Increase Physical Activity;Understanding of Exercise Prescription;Knowledge and understanding of Target Heart Rate Range (THRR);Able to understand and use rate of perceived exertion (RPE) scale;Increase Strength and Stamina;Able to understand and use Dyspnea scale;Able to check pulse independently Increase Physical Activity;Increase Strength and Stamina    Comments Has yet to start exercise sessions due to new job Reviewed RPE and dyspnea scales, THR and program prescription with pt today.  Pt voiced understanding and was given a copy of goals to take home. Reviewed home exercise with pt today.  Pt plans to walk for exercise.   Reviewed THR, pulse, RPE, sign and symptoms, pulse oximetery and when to call 911 or MD.  Also discussed weather considerations and indoor options.  Pt voiced understanding. Bonnie Ochoa is doing well and walked 45 laps on the track!  The arm cracnk is hard for her.  Staff will monitor progress.    Expected Outcomes -- Short: Use RPE daily to regulate intensity. Long: Follow program prescription in THR. Short: Practice taking HR and/or purchase pulse ox and monitor HR during exercise Long: Follow appropriate exercise presciption at home Short: attend consistently Long:  build overall stamina           Discharge Exercise Prescription (Final Exercise Prescription Changes):  Exercise Prescription Changes - 12/26/20 1100      Response to Exercise   Blood Pressure (Admit) 122/72    Blood Pressure (Exercise) 118/70    Blood Pressure (Exit) 108/62    Heart Rate (Admit) 85 bpm    Heart Rate (Exercise) 88 bpm    Heart Rate (Exit) 85 bpm    Rating of Perceived Exertion (Exercise) 15    Symptoms none    Duration Progress to 30 minutes of  aerobic  without signs/symptoms of physical distress    Intensity THRR unchanged      Progression   Progression Continue to progress workloads to maintain intensity without signs/symptoms of physical distress.    Average METs 3      Resistance Training   Training Prescription Yes    Weight 3 lb    Reps 10-15      Interval Training   Interval Training No      Arm Ergometer   Level 1    Minutes 15    METs 2.55      Track   Laps 45    METs 3.4      Home Exercise Plan   Plans to continue exercise at Home (comment)   walking   Frequency Add 2 additional days to program exercise sessions.    Initial Home Exercises Provided 12/21/20           Nutrition:  Target Goals: Understanding of nutrition guidelines, daily intake of sodium <1534m, cholesterol <2041m calories 30% from fat and 7% or less from saturated fats, daily to have 5 or more servings of fruits and vegetables.  Education: All About Nutrition: -Group instruction provided by verbal, written material, interactive activities, discussions, models, and posters to present general guidelines for heart healthy nutrition including fat, fiber, MyPlate, the role of sodium in heart healthy nutrition, utilization of the nutrition label, and utilization of this knowledge  for meal planning. Follow up email sent as well. Written material given at graduation. Flowsheet Row Cardiac Rehab from 12/14/2020 in [facility] Cardiac and Pulmonary Rehab  Education need identified 11/03/20      Biometrics:  Pre Biometrics - 11/03/20 1315      Pre Biometrics   Height 5' 5"  (1.651 m)    Weight 178 lb 3.2 oz (80.8 kg)    BMI (Calculated) 29.65    Single Leg Stand 10 seconds            Nutrition Therapy Plan and Nutrition Goals:   Nutrition Assessments:  MEDIFICTS Score Key:  ?70 Need to make dietary changes   40-70 Heart Healthy Diet  ? 40 Therapeutic Level Cholesterol Diet  Flowsheet Row Cardiac Rehab from 11/03/2020 in [facility] Cardiac and  Pulmonary Rehab  Picture Your Plate Total Score on Admission 62     Picture Your Plate Scores:  <99 Unhealthy dietary pattern with much room for improvement.  41-50 Dietary pattern unlikely to meet recommendations for good health and room for improvement.  51-60 More healthful dietary pattern, with some room for improvement.   >60 Healthy dietary pattern, although there may be some specific behaviors that could be improved.    Nutrition Goals Re-Evaluation:  Nutrition Goals Re-Evaluation    [place] Name 12/21/20 [place] has not met with Lenna Sciara, RD yet to establish nutrition goals. She did not meet yet as she missed her appt and will reschedule to get a new one.       Expected Outcome Short: Get appointment with RD Long: Maintain heart healthy diet              Nutrition Goals Discharge (Final Nutrition Goals Re-Evaluation):  Nutrition Goals Re-Evaluation - 12/21/20 [place]      Goals   Comment Bonnie Ochoa has not met with Melissa, RD yet to establish nutrition goals. She did not meet yet as she missed her appt and will reschedule to get a new one.    Expected Outcome Short: Get appointment with RD Long: Maintain heart healthy diet           Psychosocial: Target Goals: Acknowledge presence or absence of significant depression and/or stress, maximize coping skills, provide positive support system. Participant is able to verbalize types and ability to use techniques and skills needed for reducing stress and depression.   Education: Stress, Anxiety, and Depression - Group verbal and visual presentation to define topics covered.  Reviews how body is impacted by stress, anxiety, and depression.  Also discusses healthy ways to reduce stress and to treat/manage anxiety and depression.  Written material given at graduation. Flowsheet Row Cardiac Rehab from 12/14/2020 in [facility] Cardiac and Pulmonary Rehab  Education need identified 11/03/20      Education: Sleep  Hygiene -Provides group verbal and written instruction about how sleep can affect your health.  Define sleep hygiene, discuss sleep cycles and impact of sleep habits. Review good sleep hygiene tips.    Initial Review & Psychosocial Screening:  Initial Psych Review & Screening - 10/26/20 1048      Initial Review   Current issues with Current Depression;Current Psychotropic Meds      Family Dynamics   Good Support System? Yes   wife Darryll Capers, sister, children live in [place]     Barriers   Psychosocial barriers to participate in program There are no identifiable barriers or psychosocial needs.  Screening Interventions   Interventions Encouraged to exercise;To provide support and resources with identified psychosocial needs;Provide feedback about the scores to participant    Expected Outcomes Short Term goal: Utilizing psychosocial counselor, staff and physician to assist with identification of specific Stressors or current issues interfering with healing process. Setting desired goal for each stressor or current issue identified.;Long Term Goal: Stressors or current issues are controlled or eliminated.;Short Term goal: Identification and review with participant of any Quality of Life or Depression concerns found by scoring the questionnaire.;Long Term goal: The participant improves quality of Life and PHQ9 Scores as seen by post scores and/or verbalization of changes           Quality of Life Scores:   Quality of Life - 11/03/20 1315      Quality of Life   Select Quality of Life      Quality of Life Scores   Health/Function Pre 17.5 %    Socioeconomic Pre 18.56 %    Psych/Spiritual Pre 25.14 %    Family Pre 25.2 %    GLOBAL Pre 20.37 %          Scores of 19 and below usually indicate a poorer quality of life in these areas.  A difference of  2-3 points is a clinically meaningful difference.  A difference of 2-3 points in the total score of the Quality of Life Index has been  associated with significant improvement in overall quality of life, self-image, physical symptoms, and general health in studies assessing change in quality of life.  PHQ-9: Recent Review Flowsheet Data    Depression screen [facility] 2/9 11/03/2020 09/09/2020 07/11/2020 09/28/2019 05/18/2019   Decreased Interest 0 0 2 0 1   Down, Depressed, Hopeless 2 0 1 0 0   PHQ - 2 Score 2 0 3 0 1   Altered sleeping 0 - 0 1 -   Tired, decreased energy 1 - 3 0 -   Change in appetite 0 - 0 0 -   Feeling bad or failure about yourself  2 - 0 0 -   Trouble concentrating 2 - 0 0 -   Moving slowly or fidgety/restless 0 - 0 0 -   Suicidal thoughts 0 - 0 0 -   PHQ-9 Score 7 - 6 1 -   Difficult doing work/chores Not difficult at all - - Not difficult at all -     Interpretation of Total Score  Total Score Depression Severity:  1-4 = Minimal depression, 5-9 = Mild depression, 10-14 = Moderate depression, 15-19 = Moderately severe depression, 20-27 = Severe depression   Psychosocial Evaluation and Intervention:  Psychosocial Evaluation - 10/26/20 1111      Psychosocial Evaluation & Interventions   Interventions Encouraged to exercise with the program and follow exercise prescription    Comments Bonnie Ochoa has no barriers to starting the program. She has history of depression and is under the care of physician and has Paxil prscribed. She moved to [place] in the past 5 years with he sister. SHe is married for 3 years to her wife LaTricia.  She has grown children that live in [place]. Her wife aand sister are her support system. THey all live together in a home with no pets. Her goal is to increase her energy. She should do well in the program.    Expected Outcomes STG: Bonnie Ochoa attends all scheduled sessions, and is able to progress her exercise prescription over time. LTG: she has gained more energy/stamina and  is able to maintain this with continuing what she learned in the program. [facility] keeps her depression under control with continued  meds and counseling    Continue Psychosocial Services  Follow up required by staff           Psychosocial Re-Evaluation:  Psychosocial Re-Evaluation    [place] Name 12/21/20 1140             Psychosocial Re-Evaluation   Current issues with Current Depression;Current Psychotropic Meds       Comments Bonnie Ochoa reports that she is doing well. She continues to have ongoing support from her wife and kids. She is currently on medication for depression and is in the process of switching providers for a therapist. She reports the medication is helping and does not think she needs any other intervention at this time. Reports that sleep is good and has no complaints.       Expected Outcomes Short: Establish new provider for therapy Long: Utilize exercise for stress management and maintain positive attitude       Interventions Encouraged to attend Cardiac Rehabilitation for the exercise       Continue Psychosocial Services  Follow up required by staff              Psychosocial Discharge (Final Psychosocial Re-Evaluation):  Psychosocial Re-Evaluation - 12/21/20 1140      Psychosocial Re-Evaluation   Current issues with Current Depression;Current Psychotropic Meds    Comments Bonnie Ochoa reports that she is doing well. She continues to have ongoing support from her wife and kids. She is currently on medication for depression and is in the process of switching providers for a therapist. She reports the medication is helping and does not think she needs any other intervention at this time. Reports that sleep is good and has no complaints.    Expected Outcomes Short: Establish new provider for therapy Long: Utilize exercise for stress management and maintain positive attitude    Interventions Encouraged to attend Cardiac Rehabilitation for the exercise    Continue Psychosocial Services  Follow up required by staff           Vocational Rehabilitation: Provide vocational rehab assistance to qualifying  candidates.   Vocational Rehab Evaluation & Intervention:  Vocational Rehab - 10/26/20 1053      Initial Vocational Rehab Evaluation & Intervention   Assessment shows need for Vocational Rehabilitation No           Education: Education Goals: Education classes will be provided on a variety of topics geared toward better understanding of heart health and risk factor modification. Participant will state understanding/return demonstration of topics presented as noted by education test scores.  Learning Barriers/Preferences:  Learning Barriers/Preferences - 10/26/20 1053      Learning Barriers/Preferences   Learning Barriers None    Learning Preferences None           General Cardiac Education Topics:  AED/CPR: - Group verbal and written instruction with the use of models to demonstrate the basic use of the AED with the basic ABC's of resuscitation.   Anatomy and Cardiac Procedures: - Group verbal and visual presentation and models provide information about basic cardiac anatomy and function. Reviews the testing methods done to diagnose heart disease and the outcomes of the test results. Describes the treatment choices: Medical Management, Angioplasty, or Coronary Bypass Surgery for treating various heart conditions including Myocardial Infarction, Angina, Valve Disease, and Cardiac Arrhythmias.  Written material given at graduation. [place] [facility]  from 12/14/2020 in [facility] Cardiac and Pulmonary Rehab  Education need identified 11/03/20      Medication Safety: - Group verbal and visual instruction to review commonly prescribed medications for heart and lung disease. Reviews the medication, class of the drug, and side effects. Includes the steps to properly store meds and maintain the prescription regimen.  Written material given at graduation.   Intimacy: - Group verbal instruction through game format to discuss how heart and lung disease can affect sexual intimacy.  Written material given at graduation..   Know Your Numbers and Heart Failure: - Group verbal and visual instruction to discuss disease risk factors for cardiac and pulmonary disease and treatment options.  Reviews associated critical values for Overweight/Obesity, Hypertension, Cholesterol, and Diabetes.  Discusses basics of heart failure: signs/symptoms and treatments.  Introduces Heart Failure Zone chart for action plan for heart failure.  Written material given at graduation.   Infection Prevention: - Provides verbal and written material to individual with discussion of infection control including proper hand washing and proper equipment cleaning during exercise session. Flowsheet Row Cardiac Rehab from 12/14/2020 in [facility] Cardiac and Pulmonary Rehab  Date 11/03/20  Educator [facility]  Instruction Review Code 1- Verbalizes Understanding      Falls Prevention: - Provides verbal and written material to individual with discussion of falls prevention and safety. Flowsheet Row Cardiac Rehab from 12/14/2020 in [facility] Cardiac and Pulmonary Rehab  Date 10/26/20  Educator SB  Instruction Review Code 1- Verbalizes Understanding      Other: -Provides group and verbal instruction on various topics (see comments)   Knowledge Questionnaire Score:  Knowledge Questionnaire Score - 11/03/20 1317      Knowledge Questionnaire Score   Pre Score 20/26 Education Focus: Nutrition, exercise, angina, depression           Core Components/Risk Factors/Patient Goals at Admission:  Personal Goals and Risk Factors at Admission - 11/03/20 1317      Core Components/Risk Factors/Patient Goals on Admission    Weight Management Yes;Weight Maintenance    Intervention Weight Management: Develop a combined nutrition and exercise program designed to reach desired caloric intake, while maintaining appropriate intake of nutrient and fiber, sodium and fats, and appropriate energy expenditure required for the weight  goal.;Weight Management: Provide education and appropriate resources to help participant work on and attain dietary goals.;Obesity: Provide education and appropriate resources to help participant work on and attain dietary goals.    Admit Weight 178 lb 3.2 oz (80.8 kg)    Goal Weight: Short Term 173 lb (78.5 kg)    Goal Weight: Long Term 165 lb (74.8 kg)    Expected Outcomes Short Term: Continue to assess and modify interventions until short term weight is achieved;Long Term: Adherence to nutrition and physical activity/exercise program aimed toward attainment of established weight goal;Weight Loss: Understanding of general recommendations for a balanced deficit meal plan, which promotes 1-2 lb weight loss per week and includes a negative energy balance of [phone] kcal/d;Understanding recommendations for meals to include 15-35% energy as protein, 25-35% energy from fat, 35-60% energy from carbohydrates, less than 238m of dietary cholesterol, 20-35 gm of total fiber daily;Understanding of distribution of calorie intake throughout the day with the consumption of 4-5 meals/snacks    Diabetes Yes    Intervention Provide education about signs/symptoms and action to take for hypo/hyperglycemia.;Provide education about proper nutrition, including hydration, and aerobic/resistive exercise prescription along with prescribed medications to achieve blood glucose in normal ranges: Fasting glucose 65-99 mg/dL  Expected Outcomes Short Term: Participant verbalizes understanding of the signs/symptoms and immediate care of hyper/hypoglycemia, proper foot care and importance of medication, aerobic/resistive exercise and nutrition plan for blood glucose control.;Long Term: Attainment of HbA1C < 7%.    Heart Failure Yes    Intervention Provide a combined exercise and nutrition program that is supplemented with education, support and counseling about heart failure. Directed toward relieving symptoms such as shortness of  breath, decreased exercise tolerance, and extremity edema.    Expected Outcomes Improve functional capacity of life;Short term: Attendance in program 2-3 days a week with increased exercise capacity. Reported lower sodium intake. Reported increased fruit and vegetable intake. Reports medication compliance.;Short term: Daily weights obtained and reported for increase. Utilizing diuretic protocols set by physician.;Long term: Adoption of self-care skills and reduction of barriers for early signs and symptoms recognition and intervention leading to self-care maintenance.    Hypertension Yes    Intervention Provide education on lifestyle modifcations including regular physical activity/exercise, weight management, moderate sodium restriction and increased consumption of fresh fruit, vegetables, and low fat dairy, alcohol moderation, and smoking cessation.;Monitor prescription use compliance.    Expected Outcomes Short Term: Continued assessment and intervention until BP is < 140/87m HG in hypertensive participants. < 130/824mHG in hypertensive participants with diabetes, heart failure or chronic kidney disease.;Long Term: Maintenance of blood pressure at goal levels.    Lipids Yes    Intervention Provide education and support for participant on nutrition & aerobic/resistive exercise along with prescribed medications to achieve LDL <708mHDL >61m66m  Expected Outcomes Short Term: Participant states understanding of desired cholesterol values and is compliant with medications prescribed. Participant is following exercise prescription and nutrition guidelines.;Long Term: Cholesterol controlled with medications as prescribed, with individualized exercise RX and with personalized nutrition plan. Value goals: LDL < 70mg54mL > 40 mg.           Education:Diabetes - Individual verbal and written instruction to review signs/symptoms of diabetes, desired ranges of glucose level fasting, after meals and with  exercise. Acknowledge that pre and post exercise glucose checks will be done for 3 sessions at entry of program.   Core Components/Risk Factors/Patient Goals Review:   Goals and Risk Factor Review    Row Name 12/21/20 1133             Core Components/Risk Factors/Patient Goals Review   Personal Goals Review Weight Management/Obesity;Heart Failure;Hypertension;Diabetes       Review Weight has been stable, she does weigh her self at home and is aware to report and look for any sudden weight gain. At this time, she denies any symptoms for heart failure. We did discuss a hand out for heart failure, symptoms to look out for and has a take home handout to reference to. She ran out of test strips but normally checks her sugars twice/day. Her doctor wants it around 100 and reports her sugars around 90-low 100s. Her A1C trend has decreased and applauded her to continue lowering that number. I did stress importance of checking sugars, especially if ever symptomatic, and encouraged her to get new test strips as soon as possible. She does not check her BP and was also encouraged to get a cuff for home use.       Expected Outcomes Short: Obtain more test strips and home BP cuff Long: Continue to manage lifestyle risk factors              Core Components/Risk Factors/Patient Goals at Discharge (  Final Review):   Goals and Risk Factor Review - 12/21/20 1133      Core Components/Risk Factors/Patient Goals Review   Personal Goals Review Weight Management/Obesity;Heart Failure;Hypertension;Diabetes    Review Weight has been stable, she does weigh her self at home and is aware to report and look for any sudden weight gain. At this time, she denies any symptoms for heart failure. We did discuss a hand out for heart failure, symptoms to look out for and has a take home handout to reference to. She ran out of test strips but normally checks her sugars twice/day. Her doctor wants it around 100 and reports her  sugars around 90-low 100s. Her A1C trend has decreased and applauded her to continue lowering that number. I did stress importance of checking sugars, especially if ever symptomatic, and encouraged her to get new test strips as soon as possible. She does not check her BP and was also encouraged to get a cuff for home use.    Expected Outcomes Short: Obtain more test strips and home BP cuff Long: Continue to manage lifestyle risk factors           ITP Comments:  ITP Comments    Row Name 10/26/20 1110 11/03/20 1310 11/04/20 0830 11/15/20 1610 11/30/20 0622   ITP Comments Virtual orientation call completed today. shehas an appointment on Date: 11/03/2020 for EP eval and gym Orientation.  Documentation of diagnosis [facility] be found in [facility]  Date: 06/29/2020. Completed 6MWT and gym orientation. Initial ITP created and sent for review to Dr. Emily Filbert, Medical Director. Bonnie Ochoa called to let us know that she has started a new job and will be working from [employer] each day.  Her current time slot of 945 will not work with her new scheduled.  She would like to wait two weeks before starting rehab to get used to her job and to see which time would work best for her.  We will touch base again in two weeks. Waiting to hear back from new job info. 30 Day review completed. Medical Director ITP review done, changes made as directed, and signed approval by Medical Director.No visits in April   Row Name 12/14/20 1102 12/28/20 0746         ITP Comments First full day of exercise!  Patient was oriented to gym and equipment including functions, settings, policies, and procedures.  Patient's individual exercise prescription and treatment plan were reviewed.  All starting workloads were established based on the results of the 6 minute walk test done at initial orientation visit.  The plan for exercise progression was also introduced and progression will be customized based on patient's performance and goals. 30 Day review completed.  Medical Director ITP review done, changes made as directed, and signed approval by Medical Director.  New to program             Comments:

## 2020-12-30 ENCOUNTER — Ambulatory Visit: Payer: Medicare Other

## 2021-01-02 ENCOUNTER — Ambulatory Visit: Payer: Medicare Other

## 2021-01-02 ENCOUNTER — Other Ambulatory Visit: Payer: Self-pay

## 2021-01-02 ENCOUNTER — Encounter: Payer: Medicare Other | Admitting: *Deleted

## 2021-01-02 DIAGNOSIS — I5022 Chronic systolic (congestive) heart failure: Secondary | ICD-10-CM | POA: Diagnosis not present

## 2021-01-02 NOTE — Progress Notes (Signed)
Daily Session Note  Patient Details  Name: Shenea Giacobbe MRN: [id number] Date of Birth: 1966-04-07 Referring Provider:   Flowsheet Row Cardiac Rehab from 11/03/2020 in [facility] Cardiac and Pulmonary Rehab  Referring Provider Glori Bickers MD  [facility] Cardiologist: Dr. Ida Rogue      Encounter Date: 01/02/2021  Check In:  Session Check In - 01/02/21 1026      Check-In   Supervising physician immediately available to respond to emergencies See telemetry face sheet for immediately available ER MD    Location [facility]-Cardiac & Pulmonary Rehab    Staff Present Renita Papa, RN BSN;[address] [place], [place], ACSM CEP, Exercise Physiologist    Virtual Visit No    Medication changes reported     No    Fall or balance concerns reported    No    Warm-up and Cool-down Performed on first and last piece of equipment    Resistance Training Performed Yes    VAD Patient? No    PAD/SET Patient? No      Pain Assessment   Currently in Pain? No/denies              Social History   Tobacco Use  Smoking Status Former Smoker  . Types: Cigarettes  . Quit date: 93  . Years since quitting: 37.4  Smokeless Tobacco Never Used  Tobacco Comment   quit over  20 years ago     Goals Met:  Independence with exercise equipment Exercise tolerated well No report of cardiac concerns or symptoms Strength training completed today  Goals Unmet:  Not Applicable  Comments: Pt able to follow exercise prescription today without complaint.  Will continue to monitor for progression.    Dr. Emily Filbert is Medical Director for [place].  Dr. Ottie Glazier is Medical Director for [facility] Pulmonary Rehabilitation.

## 2021-01-04 ENCOUNTER — Other Ambulatory Visit: Payer: Self-pay

## 2021-01-04 ENCOUNTER — Ambulatory Visit: Payer: Medicare Other

## 2021-01-04 DIAGNOSIS — I5022 Chronic systolic (congestive) heart failure: Secondary | ICD-10-CM

## 2021-01-04 NOTE — Progress Notes (Signed)
Daily Session Note  Patient Details  Name: Bonnie Ochoa MRN: [id number] Date of Birth: 11/12/1965 Referring Provider:   Flowsheet Row Cardiac Rehab from 11/03/2020 in [facility] Cardiac and Pulmonary Rehab  Referring Provider Glori Bickers MD  [facility] Cardiologist: Dr. Ida Rogue      Encounter Date: 01/04/2021  Check In:  Session Check In - 01/04/21 1103      Check-In   Supervising physician immediately available to respond to emergencies See telemetry face sheet for immediately available ER MD    Location [facility]-Cardiac & Pulmonary Rehab    Staff Present Birdie Sons, MPA, RN;Amanda Oletta Darter, BA, ACSM CEP, Exercise Physiologist;Melissa Caiola RDN, LDN    Virtual Visit No    Medication changes reported     No    Fall or balance concerns reported    No    Warm-up and Cool-down Performed on first and last piece of equipment    Resistance Training Performed Yes    VAD Patient? No    PAD/SET Patient? No      Pain Assessment   Currently in Pain? No/denies              Social History   Tobacco Use  Smoking Status Former Smoker  . Types: Cigarettes  . Quit date: 76  . Years since quitting: 37.4  Smokeless Tobacco Never Used  Tobacco Comment   quit over  20 years ago     Goals Met:  Independence with exercise equipment Exercise tolerated well No report of cardiac concerns or symptoms Strength training completed today  Goals Unmet:  Not Applicable  Comments: Pt able to follow exercise prescription today without complaint.  Will continue to monitor for progression.    Dr. Emily Filbert is Medical Director for [place].  Dr. Ottie Glazier is Medical Director for [facility] Pulmonary Rehabilitation.

## 2021-01-06 ENCOUNTER — Other Ambulatory Visit: Payer: Self-pay

## 2021-01-06 ENCOUNTER — Ambulatory Visit: Payer: Medicare Other

## 2021-01-06 ENCOUNTER — Encounter: Payer: Medicare Other | Admitting: *Deleted

## 2021-01-06 DIAGNOSIS — I5022 Chronic systolic (congestive) heart failure: Secondary | ICD-10-CM

## 2021-01-06 NOTE — Progress Notes (Signed)
Daily Session Note  Patient Details  Name: Bonnie Ochoa MRN: [id number] Date of Birth: [birth year] Referring Provider:   Flowsheet Row Cardiac Rehab from 11/03/2020 in [facility] Cardiac and Pulmonary Rehab  Referring Provider Glori Bickers MD  [facility] Cardiologist: Dr. Ida Rogue      Encounter Date: 01/06/2021  Check In:  Session Check In - 01/06/21 0955      Check-In   Supervising physician immediately available to respond to emergencies See telemetry face sheet for immediately available ER MD    Location [facility]-Cardiac & Pulmonary Rehab    Staff Present Renita Papa, RN Margurite Auerbach, MS, ASCM CEP, Exercise Physiologist;Jessica Luan Pulling, MA, RCEP, CCRP, CCET    Virtual Visit No    Medication changes reported     No    Fall or balance concerns reported    No    Warm-up and Cool-down Performed on first and last piece of equipment    Resistance Training Performed Yes    VAD Patient? No    PAD/SET Patient? No      Pain Assessment   Currently in Pain? No/denies              Social History   Tobacco Use  Smoking Status Former Smoker  . Types: Cigarettes  . Quit date: 78  . Years since quitting: 37.4  Smokeless Tobacco Never Used  Tobacco Comment   quit over  20 years ago     Goals Met:  Independence with exercise equipment Exercise tolerated well No report of cardiac concerns or symptoms Strength training completed today  Goals Unmet:  Not Applicable  Comments: Pt able to follow exercise prescription today without complaint.  Will continue to monitor for progression.    Dr. Emily Filbert is Medical Director for [place].  Dr. Ottie Glazier is Medical Director for [facility] Pulmonary Rehabilitation.

## 2021-01-08 ENCOUNTER — Other Ambulatory Visit: Payer: Self-pay | Admitting: Family Medicine

## 2021-01-08 ENCOUNTER — Other Ambulatory Visit: Payer: Self-pay | Admitting: Psychiatry

## 2021-01-08 DIAGNOSIS — F3131 Bipolar disorder, current episode depressed, mild: Secondary | ICD-10-CM

## 2021-01-08 NOTE — Telephone Encounter (Signed)
Requested medication (s) are due for refill today:   Requested medication (s) are on the active medication list: yes  Last refill:  09/12/20  Future visit scheduled: yes  Notes to clinic:  med not delegated to NT to RF   Requested Prescriptions  Pending Prescriptions Disp Refills   cyclobenzaprine (FLEXERIL) 5 MG tablet [Pharmacy Med Name: CYCLOBENZAPRINE 5 MG TABLET] 60 tablet 1    Sig: TAKE 1 TABLET BY MOUTH THREE TIMES A DAY AS NEEDED FOR MUSCLE SPASMS      Not Delegated - Analgesics:  Muscle Relaxants Failed - 01/08/2021  1:11 AM      Failed - This refill cannot be delegated      Passed - Valid encounter within last 6 months    Recent Outpatient Visits           6 months ago Type 2 diabetes mellitus with stage 3a chronic kidney disease, with long-term current use of insulin (HCC)   [facility] [place], Megan P, DO   8 months ago Type 2 diabetes mellitus with stage 3a chronic kidney disease, with long-term current use of insulin (HCC)   [facility] [place], Megan P, DO   9 months ago Type 2 diabetes mellitus with stage 3a chronic kidney disease, with long-term current use of insulin (HCC)   [facility] [place], Megan P, DO   9 months ago Procedure not carried out   [facility], Megan P, DO   10 months ago Type 2 diabetes mellitus with stage 3a chronic kidney disease, with long-term current use of insulin (HCC)   [facility] [place], [place], DO       Future Appointments             In 2 days Laural Benes, Oralia Rud, DO [employer] [facility], [facility]   In 3 months Gollan, Tollie Pizza, MD [employer]   In 8 months  [employer] [facility], [facility]

## 2021-01-10 ENCOUNTER — Ambulatory Visit (INDEPENDENT_AMBULATORY_CARE_PROVIDER_SITE_OTHER): Payer: Medicare Other | Admitting: Family Medicine

## 2021-01-10 ENCOUNTER — Encounter: Payer: Self-pay | Admitting: Family Medicine

## 2021-01-10 VITALS — BP 103/72 | HR 85 | Temp 98.4°F | Wt 182.6 lb

## 2021-01-10 DIAGNOSIS — E1122 Type 2 diabetes mellitus with diabetic chronic kidney disease: Secondary | ICD-10-CM

## 2021-01-10 DIAGNOSIS — Z794 Long term (current) use of insulin: Secondary | ICD-10-CM

## 2021-01-10 DIAGNOSIS — F3132 Bipolar disorder, current episode depressed, moderate: Secondary | ICD-10-CM

## 2021-01-10 DIAGNOSIS — I428 Other cardiomyopathies: Secondary | ICD-10-CM

## 2021-01-10 DIAGNOSIS — J4530 Mild persistent asthma, uncomplicated: Secondary | ICD-10-CM | POA: Diagnosis not present

## 2021-01-10 DIAGNOSIS — I5022 Chronic systolic (congestive) heart failure: Secondary | ICD-10-CM | POA: Diagnosis not present

## 2021-01-10 DIAGNOSIS — R079 Chest pain, unspecified: Secondary | ICD-10-CM | POA: Diagnosis not present

## 2021-01-10 DIAGNOSIS — F4312 Post-traumatic stress disorder, chronic: Secondary | ICD-10-CM

## 2021-01-10 DIAGNOSIS — E039 Hypothyroidism, unspecified: Secondary | ICD-10-CM | POA: Diagnosis not present

## 2021-01-10 DIAGNOSIS — N1831 Chronic kidney disease, stage 3a: Secondary | ICD-10-CM

## 2021-01-10 DIAGNOSIS — K7469 Other cirrhosis of liver: Secondary | ICD-10-CM

## 2021-01-10 DIAGNOSIS — I200 Unstable angina: Secondary | ICD-10-CM | POA: Diagnosis not present

## 2021-01-10 DIAGNOSIS — I129 Hypertensive chronic kidney disease with stage 1 through stage 4 chronic kidney disease, or unspecified chronic kidney disease: Secondary | ICD-10-CM | POA: Diagnosis not present

## 2021-01-10 DIAGNOSIS — E042 Nontoxic multinodular goiter: Secondary | ICD-10-CM | POA: Diagnosis not present

## 2021-01-10 LAB — MICROSCOPIC EXAMINATION
RBC, Urine: NONE SEEN /hpf (ref 0–2)
WBC, UA: NONE SEEN /hpf (ref 0–5)

## 2021-01-10 LAB — URINALYSIS, ROUTINE W REFLEX MICROSCOPIC
Bilirubin, UA: NEGATIVE
Ketones, UA: NEGATIVE
Leukocytes,UA: NEGATIVE
Nitrite, UA: NEGATIVE
RBC, UA: NEGATIVE
Specific Gravity, UA: 1.02 (ref 1.005–1.030)
Urobilinogen, Ur: 0.2 mg/dL (ref 0.2–1.0)
pH, UA: 5 (ref 5.0–7.5)

## 2021-01-10 LAB — BAYER DCA HB A1C WAIVED: HB A1C (BAYER DCA - WAIVED): 8.7 % — ABNORMAL HIGH (ref <7.0)

## 2021-01-10 LAB — MICROALBUMIN, URINE WAIVED
Creatinine, Urine Waived: 200 mg/dL (ref 10–300)
Microalb, Ur Waived: 150 mg/L — ABNORMAL HIGH (ref 0–19)

## 2021-01-10 NOTE — Telephone Encounter (Signed)
Scheduled today 5/31

## 2021-01-10 NOTE — Progress Notes (Signed)
BP 103/72   Pulse 85   Temp 98.4 F (36.9 C)   Wt 182 lb 9.6 oz (82.8 kg)   SpO2 98%   BMI 30.39 kg/m    Subjective:    Patient ID: Bonnie Ochoa, female    DOB: 11/29/1965, 55 y.o.   MRN: [id number]  HPI: Bonnie Ochoa is a 55 y.o. female  Chief Complaint  Patient presents with  . Diabetes  . Asthma  . Chest Pain    Patient states she noticed a few weeks ago she began having chest pain off and on. No SOB.   Marland Kitchen psych referral    Patient states location where referral was sent was denied due to they dont accept her insurance    DIABETES Hypoglycemic episodes:no Polydipsia/polyuria: no Visual disturbance: no Chest pain: no Paresthesias: no Glucose Monitoring: no  Accucheck frequency: Not Checking Taking Insulin?: no Blood Pressure Monitoring: not checking Retinal Examination: Not up to Date Foot Exam: Up to Date Diabetic Education: Completed Pneumovax: Not up to Date Influenza: Up to Date Aspirin: no  CHEST PAIN Duration: 2 weeks Onset: sudden Quality: sharp and pressure Severity: moderate Location: left para substernal Radiation: left arm Episode duration: 5-10 minutes Frequency: couple of times a day with activity Related to exertion: yes Activity when pain started: exertion Trauma: no Anxiety/recent stressors: yes Aggravating factors:  Alleviating factors:  Status: stable Treatments attempted: nothing  Current pain status: in pain Shortness of breath: no Cough: no Nausea: no Diaphoresis: no Heartburn: no Palpitations: no  BIPOLAR Mood status: uncontrolled Satisfied with current treatment?: no Symptom severity: moderate  Duration of current treatment : chronic Side effects: no Medication compliance: fair compliance Psychotherapy/counseling: no  Depressed mood: yes Anxious mood: yes Anhedonia: no Significant weight loss or gain: no Insomnia: no  Fatigue: yes Feelings of worthlessness or guilt: yes Impaired concentration/indecisiveness:  yes Suicidal ideations: no Hopelessness: no Crying spells: no Depression screen [facility] 2/9 01/10/2021 11/03/2020 09/09/2020 07/11/2020 09/28/2019  Decreased Interest 1 0 0 2 0  Down, Depressed, Hopeless 2 2 0 1 0  PHQ - 2 Score 3 2 0 3 0  Altered sleeping 0 0 - 0 1  Tired, decreased energy 1 1 - 3 0  Change in appetite 0 0 - 0 0  Feeling bad or failure about yourself  2 2 - 0 0  Trouble concentrating 1 2 - 0 0  Moving slowly or fidgety/restless 3 0 - 0 0  Suicidal thoughts 0 0 - 0 0  PHQ-9 Score 10 7 - 6 1  Difficult doing work/chores Not difficult at all Not difficult at all - - Not difficult at all  Some encounter information is confidential and restricted. Go to Review Flowsheets activity to see all data.  Some recent data might be hidden     Relevant past medical, surgical, family and social history reviewed and updated as indicated. Interim medical history since our last visit reviewed. Allergies and medications reviewed and updated.  Review of Systems  Constitutional: Negative.   Respiratory: Negative.   Cardiovascular: Negative.   Gastrointestinal: Negative.   Skin: Negative.   Neurological: Negative.   Psychiatric/Behavioral: Positive for decreased concentration, dysphoric mood and sleep disturbance. Negative for agitation, behavioral problems, confusion, hallucinations, self-injury and suicidal ideas. The patient is nervous/anxious. The patient is not hyperactive.     Per HPI unless specifically indicated above     Objective:    BP 103/72   Pulse 85   Temp 98.4 F (36.9 C)  Wt 182 lb 9.6 oz (82.8 kg)   SpO2 98%   BMI 30.39 kg/m   Wt Readings from Last 3 Encounters:  01/10/21 182 lb 9.6 oz (82.8 kg)  11/03/20 178 lb 3.2 oz (80.8 kg)  10/13/20 177 lb 3.2 oz (80.4 kg)    Physical Exam Vitals and nursing note reviewed.  Constitutional:      General: She is not in acute distress.    Appearance: Normal appearance. She is not ill-appearing, toxic-appearing or  diaphoretic.  HENT:     Head: Normocephalic and atraumatic.     Right Ear: External ear normal.     Left Ear: External ear normal.     Nose: Nose normal.     Mouth/Throat:     Mouth: Mucous membranes are moist.     Pharynx: Oropharynx is clear.  Eyes:     General: No scleral icterus.       Right eye: No discharge.        Left eye: No discharge.     Extraocular Movements: Extraocular movements intact.     Conjunctiva/sclera: Conjunctivae normal.     Pupils: Pupils are equal, round, and reactive to light.  Cardiovascular:     Rate and Rhythm: Normal rate and regular rhythm.     Pulses: Normal pulses.     Heart sounds: Normal heart sounds. No murmur heard. No friction rub. No gallop.   Pulmonary:     Effort: Pulmonary effort is normal. No respiratory distress.     Breath sounds: Normal breath sounds. No stridor. No wheezing, rhonchi or rales.  Chest:     Chest wall: No tenderness.  Musculoskeletal:        General: Normal range of motion.     Cervical back: Normal range of motion and neck supple.  Skin:    General: Skin is warm and dry.     Capillary Refill: Capillary refill takes less than 2 seconds.     Coloration: Skin is not jaundiced or pale.     Findings: No bruising, erythema, lesion or rash.  Neurological:     General: No focal deficit present.     Mental Status: She is alert and oriented to person, place, and time. Mental status is at baseline.  Psychiatric:        Mood and Affect: Mood normal.        Behavior: Behavior normal.        Thought Content: Thought content normal.        Judgment: Judgment normal.     Results for orders placed or performed in visit on 01/10/21  Microscopic Examination   Urine  Result Value Ref Range   WBC, UA None seen 0 - 5 /hpf   RBC None seen 0 - 2 /hpf   Epithelial Cells (non renal) 0-10 0 - 10 /hpf   Mucus, UA Present (A) Not Estab.   Bacteria, UA Moderate (A) None seen/Few  CBC with Differential/Platelet  Result Value Ref  Range   WBC 7.7 3.4 - 10.8 x10E3/uL   RBC 4.59 3.77 - 5.28 x10E6/uL   Hemoglobin 13.9 11.1 - 15.9 g/dL   Hematocrit 41.6 34.0 - 46.6 %   MCV 91 79 - 97 fL   MCH 30.3 26.6 - 33.0 pg   MCHC 33.4 31.5 - 35.7 g/dL   RDW 12.7 11.7 - 15.4 %   Platelets 208 150 - 450 x10E3/uL   Neutrophils 47 Not Estab. %   Lymphs 40 Not Estab. %  Monocytes 9 Not Estab. %   Eos 3 Not Estab. %   Basos 1 Not Estab. %   Neutrophils Absolute 3.6 1.4 - 7.0 x10E3/uL   Lymphocytes Absolute 3.1 0.7 - 3.1 x10E3/uL   Monocytes Absolute 0.7 0.1 - 0.9 x10E3/uL   EOS (ABSOLUTE) 0.2 0.0 - 0.4 x10E3/uL   Basophils Absolute 0.0 0.0 - 0.2 x10E3/uL   Immature Granulocytes 0 Not Estab. %   Immature Grans (Abs) 0.0 0.0 - 0.1 x10E3/uL  Comprehensive metabolic panel  Result Value Ref Range   Glucose 209 (H) 65 - 99 mg/dL   BUN 31 (H) 6 - 24 mg/dL   Creatinine, Ser 1.52 (H) 0.57 - 1.00 mg/dL   eGFR 40 (L) >59 mL/min/1.73   BUN/Creatinine Ratio 20 9 - 23   Sodium 136 134 - 144 mmol/L   Potassium 4.1 3.5 - 5.2 mmol/L   Chloride 101 96 - 106 mmol/L   CO2 18 (L) 20 - 29 mmol/L   Calcium 9.6 8.7 - 10.2 mg/dL   Total Protein 8.4 6.0 - 8.5 g/dL   Albumin 4.5 3.8 - 4.9 g/dL   Globulin, Total 3.9 1.5 - 4.5 g/dL   Albumin/Globulin Ratio 1.2 1.2 - 2.2   Bilirubin Total 0.5 0.0 - 1.2 mg/dL   Alkaline Phosphatase 85 44 - 121 IU/L   AST 11 0 - 40 IU/L   ALT 12 0 - 32 IU/L  Lipid Panel w/o Chol/HDL Ratio  Result Value Ref Range   Cholesterol, Total 188 100 - 199 mg/dL   Triglycerides 359 (H) 0 - 149 mg/dL   HDL 35 (L) >39 mg/dL   VLDL Cholesterol Cal 60 (H) 5 - 40 mg/dL   LDL Chol Calc (NIH) 93 0 - 99 mg/dL  Urinalysis, Routine w reflex microscopic  Result Value Ref Range   Specific Gravity, UA 1.020 1.005 - 1.030   pH, UA 5.0 5.0 - 7.5   Color, UA Yellow Yellow   Appearance Ur Clear Clear   Leukocytes,UA Negative Negative   Protein,UA 1+ (A) Negative/Trace   Glucose, UA 3+ (A) Negative   Ketones, UA Negative Negative    RBC, UA Negative Negative   Bilirubin, UA Negative Negative   Urobilinogen, Ur 0.2 0.2 - 1.0 mg/dL   Nitrite, UA Negative Negative   Microscopic Examination See below:   TSH  Result Value Ref Range   TSH 1.860 0.450 - 4.500 uIU/mL  Bayer DCA Hb A1c Waived  Result Value Ref Range   HB A1C (BAYER DCA - WAIVED) 8.7 (H) <7.0 %  Microalbumin, Urine Waived  Result Value Ref Range   Microalb, Ur Waived 150 (H) 0 - 19 mg/L   Creatinine, Urine Waived 200 10 - 300 mg/dL   Microalb/Creat Ratio 30-300 (H) <30 mg/g      Assessment & Plan:   Problem List Items Addressed This Visit      Cardiovascular and Mediastinum   Chronic systolic heart failure (HCC) (Chronic)    Has not gotten into see her cardiology recently. Referral generated today. Await their input.       NICM (nonischemic cardiomyopathy) ([place])    Has not gotten into see her cardiology recently. Referral generated today. Await their input.       Unstable angina (HCC)    Has not gotten into see her cardiology recently. Referral generated today. Await their input.         Respiratory   Asthma    Under good control on current regimen. Continue  current regimen. Continue to monitor. Call with any concerns. Refills given. Labs drawn today.       Relevant Orders   CBC with Differential/Platelet (Completed)   Comprehensive metabolic panel (Completed)     Digestive   Cirrhosis ([place])    Continue to follow with GI. Continue to monitor. Call with any concerns. Refills given today.      Relevant Orders   CBC with Differential/Platelet (Completed)   Comprehensive metabolic panel (Completed)     Endocrine   DM (diabetes mellitus), type 2 with renal complications ([place])    Not under good control. Needs to go back on jardiance- will restart and recheck 3 months. Call with any concerns.       Relevant Orders   CBC with Differential/Platelet (Completed)   Comprehensive metabolic panel (Completed)   Lipid Panel w/o Chol/HDL  Ratio (Completed)   Urinalysis, Routine w reflex microscopic (Completed)   Bayer DCA Hb A1c Waived (Completed)   Microalbumin, Urine Waived (Completed)   Multiple thyroid nodules    Rechecking labs today. Await results. Treat as needed.       Relevant Orders   CBC with Differential/Platelet (Completed)   Comprehensive metabolic panel (Completed)   TSH (Completed)   Hypothyroid    Rechecking labs today. Await results. Treat as needed.       Relevant Orders   CBC with Differential/Platelet (Completed)   Comprehensive metabolic panel (Completed)     Genitourinary   Benign hypertensive renal disease    Under good control on current regimen. Continue current regimen. Continue to monitor. Call with any concerns. Refills given. Labs drawn today.       Relevant Orders   CBC with Differential/Platelet (Completed)   Comprehensive metabolic panel (Completed)   Microalbumin, Urine Waived (Completed)     Other   Bipolar disorder, current episode depressed, moderate ([place])    Does not want to go back to see her psychiatrist- will see about switching providers within her practice. If not able to, will let us know and we'll refer to [place] behavioral care. Await their input.       Relevant Orders   CBC with Differential/Platelet (Completed)   Comprehensive metabolic panel (Completed)   Chronic post-traumatic stress disorder (PTSD)    Does not want to go back to see her psychiatrist- will see about switching providers within her practice. If not able to, will let us know and we'll refer to [place] behavioral care. Await their input.        Other Visit Diagnoses    Chest pain, unspecified type    -  Primary   Needs to get back in with cardiology. Will get her in ASAP. Warning signs to go to the ER discussed.    Relevant Orders   EKG 12-Lead (Completed)       Follow up plan: Return in about 3 months (around 04/12/2021).

## 2021-01-11 ENCOUNTER — Encounter: Payer: Medicare Other | Attending: Internal Medicine | Admitting: *Deleted

## 2021-01-11 ENCOUNTER — Ambulatory Visit: Payer: Medicare Other

## 2021-01-11 ENCOUNTER — Other Ambulatory Visit: Payer: Self-pay

## 2021-01-11 DIAGNOSIS — I5022 Chronic systolic (congestive) heart failure: Secondary | ICD-10-CM | POA: Diagnosis not present

## 2021-01-11 DIAGNOSIS — Z87891 Personal history of nicotine dependence: Secondary | ICD-10-CM | POA: Insufficient documentation

## 2021-01-11 LAB — CBC WITH DIFFERENTIAL/PLATELET
Basophils Absolute: 0 10*3/uL (ref 0.0–0.2)
Basos: 1 %
EOS (ABSOLUTE): 0.2 10*3/uL (ref 0.0–0.4)
Eos: 3 %
Hematocrit: 41.6 % (ref 34.0–46.6)
Hemoglobin: 13.9 g/dL (ref 11.1–15.9)
Immature Grans (Abs): 0 10*3/uL (ref 0.0–0.1)
Immature Granulocytes: 0 %
Lymphocytes Absolute: 3.1 10*3/uL (ref 0.7–3.1)
Lymphs: 40 %
MCH: 30.3 pg (ref 26.6–33.0)
MCHC: 33.4 g/dL (ref 31.5–35.7)
MCV: 91 fL (ref 79–97)
Monocytes Absolute: 0.7 10*3/uL (ref 0.1–0.9)
Monocytes: 9 %
Neutrophils Absolute: 3.6 10*3/uL (ref 1.4–7.0)
Neutrophils: 47 %
Platelets: 208 10*3/uL (ref 150–450)
RBC: 4.59 x10E6/uL (ref 3.77–5.28)
RDW: 12.7 % (ref 11.7–15.4)
WBC: 7.7 10*3/uL (ref 3.4–10.8)

## 2021-01-11 LAB — COMPREHENSIVE METABOLIC PANEL
ALT: 12 IU/L (ref 0–32)
AST: 11 IU/L (ref 0–40)
Albumin/Globulin Ratio: 1.2 (ref 1.2–2.2)
Albumin: 4.5 g/dL (ref 3.8–4.9)
Alkaline Phosphatase: 85 IU/L (ref 44–121)
BUN/Creatinine Ratio: 20 (ref 9–23)
BUN: 31 mg/dL — ABNORMAL HIGH (ref 6–24)
Bilirubin Total: 0.5 mg/dL (ref 0.0–1.2)
CO2: 18 mmol/L — ABNORMAL LOW (ref 20–29)
Calcium: 9.6 mg/dL (ref 8.7–10.2)
Chloride: 101 mmol/L (ref 96–106)
Creatinine, Ser: 1.52 mg/dL — ABNORMAL HIGH (ref 0.57–1.00)
Globulin, Total: 3.9 g/dL (ref 1.5–4.5)
Glucose: 209 mg/dL — ABNORMAL HIGH (ref 65–99)
Potassium: 4.1 mmol/L (ref 3.5–5.2)
Sodium: 136 mmol/L (ref 134–144)
Total Protein: 8.4 g/dL (ref 6.0–8.5)
eGFR: 40 mL/min/{1.73_m2} — ABNORMAL LOW (ref >59)

## 2021-01-11 LAB — LIPID PANEL W/O CHOL/HDL RATIO
Cholesterol, Total: 188 mg/dL (ref 100–199)
HDL: 35 mg/dL — ABNORMAL LOW (ref >39)
LDL Chol Calc (NIH): 93 mg/dL (ref 0–99)
Triglycerides: 359 mg/dL — ABNORMAL HIGH (ref 0–149)
VLDL Cholesterol Cal: 60 mg/dL — ABNORMAL HIGH (ref 5–40)

## 2021-01-11 LAB — TSH: TSH: 1.86 u[IU]/mL (ref 0.450–4.500)

## 2021-01-11 NOTE — Progress Notes (Signed)
Daily Session Note  Patient Details  Name: Bonnie Ochoa MRN: [id number] Date of Birth: [birth year] Referring Provider:   Flowsheet Row Cardiac Rehab from 11/03/2020 in [facility] Cardiac and Pulmonary Rehab  Referring Provider Glori Bickers MD  [facility] Cardiologist: Dr. Ida Rogue      Encounter Date: 01/11/2021  Check In:  Session Check In - 01/11/21 1005      Check-In   Supervising physician immediately available to respond to emergencies See telemetry face sheet for immediately available ER MD    Location [facility]-Cardiac & Pulmonary Rehab    Staff Present Heath Lark, RN, BSN, CCRP;Melissa Caiola RDN, Rowe Pavy, BA, ACSM CEP, Exercise Physiologist;Meredith Sherryll Burger, RN BSN    Virtual Visit No    Medication changes reported     No    Fall or balance concerns reported    No    Warm-up and Cool-down Performed on first and last piece of equipment    Resistance Training Performed Yes    VAD Patient? No    PAD/SET Patient? No      Pain Assessment   Currently in Pain? No/denies              Social History   Tobacco Use  Smoking Status Former Smoker  . Types: Cigarettes  . Quit date: 79  . Years since quitting: 37.4  Smokeless Tobacco Never Used  Tobacco Comment   quit over  20 years ago     Goals Met:  Independence with exercise equipment Exercise tolerated well No report of cardiac concerns or symptoms  Goals Unmet:  Not Applicable  Comments: Pt able to follow exercise prescription today without complaint.  Will continue to monitor for progression.    Dr. Emily Filbert is Medical Director for [place].  Dr. Ottie Glazier is Medical Director for [facility] Pulmonary Rehabilitation.

## 2021-01-13 ENCOUNTER — Encounter: Payer: Medicare Other | Admitting: *Deleted

## 2021-01-13 ENCOUNTER — Ambulatory Visit: Payer: Medicare Other

## 2021-01-13 ENCOUNTER — Other Ambulatory Visit: Payer: Self-pay

## 2021-01-13 DIAGNOSIS — Z87891 Personal history of nicotine dependence: Secondary | ICD-10-CM | POA: Diagnosis not present

## 2021-01-13 DIAGNOSIS — I5022 Chronic systolic (congestive) heart failure: Secondary | ICD-10-CM

## 2021-01-13 NOTE — Progress Notes (Signed)
Daily Session Note  Patient Details  Name: Bonnie Ochoa MRN: [id number] Date of Birth: July 24, 1966 Referring Provider:   Flowsheet Row Cardiac Rehab from 11/03/2020 in [facility] Cardiac and Pulmonary Rehab  Referring Provider Glori Bickers MD  [facility] Cardiologist: Dr. Ida Rogue      Encounter Date: 01/13/2021  Check In:  Session Check In - 01/13/21 1032      Check-In   Supervising physician immediately available to respond to emergencies See telemetry face sheet for immediately available ER MD    Location [facility]-Cardiac & Pulmonary Rehab    Staff Present Heath Lark, RN, BSN, CCRP;Jessica [place], MA, RCEP, CCRP, CCET;Joseph Ione RCP,RRT,BSRT    Virtual Visit No    Medication changes reported     No    Fall or balance concerns reported    No    Warm-up and Cool-down Performed on first and last piece of equipment    Resistance Training Performed Yes    VAD Patient? No    PAD/SET Patient? No      Pain Assessment   Currently in Pain? No/denies              Social History   Tobacco Use  Smoking Status Former Smoker  . Types: Cigarettes  . Quit date: 50  . Years since quitting: 37.4  Smokeless Tobacco Never Used  Tobacco Comment   quit over  20 years ago     Goals Met:  Independence with exercise equipment Exercise tolerated well No report of cardiac concerns or symptoms  Goals Unmet:  Not Applicable  Comments: Pt able to follow exercise prescription today without complaint.  Will continue to monitor for progression.    Dr. Emily Filbert is Medical Director for [place].  Dr. Ottie Glazier is Medical Director for [facility] Pulmonary Rehabilitation.

## 2021-01-16 ENCOUNTER — Other Ambulatory Visit: Payer: Self-pay | Admitting: Family Medicine

## 2021-01-16 ENCOUNTER — Ambulatory Visit: Payer: Medicare Other

## 2021-01-16 ENCOUNTER — Encounter: Payer: Medicare Other | Admitting: *Deleted

## 2021-01-16 ENCOUNTER — Other Ambulatory Visit: Payer: Self-pay

## 2021-01-16 ENCOUNTER — Encounter: Payer: Self-pay | Admitting: Family Medicine

## 2021-01-16 DIAGNOSIS — Z87891 Personal history of nicotine dependence: Secondary | ICD-10-CM | POA: Diagnosis not present

## 2021-01-16 DIAGNOSIS — I5022 Chronic systolic (congestive) heart failure: Secondary | ICD-10-CM

## 2021-01-16 DIAGNOSIS — N289 Disorder of kidney and ureter, unspecified: Secondary | ICD-10-CM

## 2021-01-16 NOTE — Assessment & Plan Note (Signed)
Rechecking labs today. Await results. Treat as needed.  °

## 2021-01-16 NOTE — Assessment & Plan Note (Signed)
Has not gotten into see her cardiology recently. Referral generated today. Await their input.

## 2021-01-16 NOTE — Assessment & Plan Note (Signed)
Has not gotten into see her cardiology recently. Referral generated today. Await their input.  

## 2021-01-16 NOTE — Assessment & Plan Note (Signed)
Does not want to go back to see her psychiatrist- will see about switching providers within her practice. If not able to, will let us know and we'll refer to [place] behavioral care. Await their input.

## 2021-01-16 NOTE — Assessment & Plan Note (Signed)
Not under good control. Needs to go back on jardiance- will restart and recheck 3 months. Call with any concerns.

## 2021-01-16 NOTE — Assessment & Plan Note (Signed)
Under good control on current regimen. Continue current regimen. Continue to monitor. Call with any concerns. Refills given. Labs drawn today.   

## 2021-01-16 NOTE — Progress Notes (Signed)
Daily Session Note  Patient Details  Name: Bonnie Ochoa MRN: [id number] Date of Birth: [birth year] Referring Provider:   Flowsheet Row Cardiac Rehab from 11/03/2020 in [facility] Cardiac and Pulmonary Rehab  Referring Provider Glori Bickers MD  [facility] Cardiologist: Dr. Ida Rogue      Encounter Date: 01/16/2021  Check In:  Session Check In - 01/16/21 1232      Check-In   Supervising physician immediately available to respond to emergencies See telemetry face sheet for immediately available ER MD    Location [facility]-Cardiac & Pulmonary Rehab    Staff Present Heath Lark, RN, BSN, CCRP;Joseph Hood RCP,RRT,BSRT;Kelly [place], [place], ACSM CEP, Exercise Physiologist    Virtual Visit No    Medication changes reported     No    Fall or balance concerns reported    No    Warm-up and Cool-down Performed on first and last piece of equipment    Resistance Training Performed Yes    VAD Patient? No    PAD/SET Patient? No      Pain Assessment   Currently in Pain? No/denies              Social History   Tobacco Use  Smoking Status Former Smoker  . Types: Cigarettes  . Quit date: 82  . Years since quitting: 37.4  Smokeless Tobacco Never Used  Tobacco Comment   quit over  20 years ago     Goals Met:  Independence with exercise equipment Exercise tolerated well No report of cardiac concerns or symptoms  Goals Unmet:  Not Applicable  Comments: Pt able to follow exercise prescription today without complaint.  Will continue to monitor for progression.    Dr. Emily Filbert is Medical Director for [place].  Dr. Ottie Glazier is Medical Director for [facility] Pulmonary Rehabilitation.

## 2021-01-16 NOTE — Assessment & Plan Note (Signed)
Continue to follow with GI. Continue to monitor. Call with any concerns. Refills given today.

## 2021-01-16 NOTE — Assessment & Plan Note (Signed)
Does not want to go back to see her psychiatrist- will see about switching providers within her practice. If not able to, will let us know and we'll refer to [place] behavioral care. Await their input.  

## 2021-01-17 ENCOUNTER — Telehealth: Payer: Self-pay

## 2021-01-17 NOTE — Telephone Encounter (Signed)
Bonnie Ochoa, I think this got switched from psychiatry to physiatry- can we get it back to psychiatry?

## 2021-01-17 NOTE — Telephone Encounter (Signed)
Copied from CRM [id number]. Topic: Referral - Question >> Jan 17, 2021 12:16 PM Traci Sermon wrote: Reason for CRM: [facility] called and stated the refferal that was sent to them referral #[id number], stated is was for  PSYCHIATRY and they do not do those. Wanted to let someone know if the referral was an error. Please advise

## 2021-01-18 ENCOUNTER — Encounter (HOSPITAL_COMMUNITY): Payer: Self-pay | Admitting: Internal Medicine

## 2021-01-18 ENCOUNTER — Other Ambulatory Visit: Payer: Self-pay

## 2021-01-18 ENCOUNTER — Ambulatory Visit (HOSPITAL_COMMUNITY)
Admission: RE | Admit: 2021-01-18 | Discharge: 2021-01-18 | Disposition: A | Discharge status: Home / Self Care | Payer: Medicare Other | Source: Ambulatory Visit | Attending: Internal Medicine | Admitting: Internal Medicine

## 2021-01-18 ENCOUNTER — Ambulatory Visit: Payer: Medicare Other

## 2021-01-18 VITALS — BP 118/79 | HR 78 | Wt 184.4 lb

## 2021-01-18 DIAGNOSIS — I340 Nonrheumatic mitral (valve) insufficiency: Secondary | ICD-10-CM | POA: Insufficient documentation

## 2021-01-18 DIAGNOSIS — I5042 Chronic combined systolic (congestive) and diastolic (congestive) heart failure: Secondary | ICD-10-CM | POA: Insufficient documentation

## 2021-01-18 DIAGNOSIS — Z794 Long term (current) use of insulin: Secondary | ICD-10-CM | POA: Insufficient documentation

## 2021-01-18 DIAGNOSIS — I447 Left bundle-branch block, unspecified: Secondary | ICD-10-CM | POA: Diagnosis not present

## 2021-01-18 DIAGNOSIS — I5022 Chronic systolic (congestive) heart failure: Secondary | ICD-10-CM

## 2021-01-18 DIAGNOSIS — Z87891 Personal history of nicotine dependence: Secondary | ICD-10-CM | POA: Diagnosis not present

## 2021-01-18 DIAGNOSIS — Z95810 Presence of automatic (implantable) cardiac defibrillator: Secondary | ICD-10-CM | POA: Diagnosis not present

## 2021-01-18 DIAGNOSIS — I2510 Atherosclerotic heart disease of native coronary artery without angina pectoris: Secondary | ICD-10-CM | POA: Insufficient documentation

## 2021-01-18 DIAGNOSIS — I428 Other cardiomyopathies: Secondary | ICD-10-CM | POA: Insufficient documentation

## 2021-01-18 DIAGNOSIS — Z79899 Other long term (current) drug therapy: Secondary | ICD-10-CM | POA: Diagnosis not present

## 2021-01-18 DIAGNOSIS — I313 Pericardial effusion (noninflammatory): Secondary | ICD-10-CM | POA: Diagnosis not present

## 2021-01-18 DIAGNOSIS — N182 Chronic kidney disease, stage 2 (mild): Secondary | ICD-10-CM | POA: Diagnosis not present

## 2021-01-18 DIAGNOSIS — Z8249 Family history of ischemic heart disease and other diseases of the circulatory system: Secondary | ICD-10-CM | POA: Diagnosis not present

## 2021-01-18 DIAGNOSIS — I130 Hypertensive heart and chronic kidney disease with heart failure and stage 1 through stage 4 chronic kidney disease, or unspecified chronic kidney disease: Secondary | ICD-10-CM | POA: Diagnosis not present

## 2021-01-18 DIAGNOSIS — F1290 Cannabis use, unspecified, uncomplicated: Secondary | ICD-10-CM | POA: Diagnosis not present

## 2021-01-18 DIAGNOSIS — G4733 Obstructive sleep apnea (adult) (pediatric): Secondary | ICD-10-CM | POA: Diagnosis not present

## 2021-01-18 DIAGNOSIS — E1122 Type 2 diabetes mellitus with diabetic chronic kidney disease: Secondary | ICD-10-CM | POA: Insufficient documentation

## 2021-01-18 LAB — BASIC METABOLIC PANEL
Anion gap: 8 (ref 5–15)
BUN: 24 mg/dL — ABNORMAL HIGH (ref 6–20)
CO2: 27 mmol/L (ref 22–32)
Calcium: 9.1 mg/dL (ref 8.9–10.3)
Chloride: 102 mmol/L (ref 98–111)
Creatinine, Ser: 1.33 mg/dL — ABNORMAL HIGH (ref 0.44–1.00)
GFR, Estimated: 47 mL/min — ABNORMAL LOW (ref >60)
Glucose, Bld: 190 mg/dL — ABNORMAL HIGH (ref 70–99)
Potassium: 3.7 mmol/L (ref 3.5–5.1)
Sodium: 137 mmol/L (ref 135–145)

## 2021-01-18 LAB — BRAIN NATRIURETIC PEPTIDE: B Natriuretic Peptide: 39.1 pg/mL (ref 0.0–100.0)

## 2021-01-18 NOTE — Addendum Note (Signed)
Encounter addended by: Dolores Patty, MD on: 01/18/2021 4:32 PM  Actions taken: Charge Capture section accepted

## 2021-01-18 NOTE — Patient Instructions (Signed)
EKG done today.   Labs done today. We will contact you only if your labs are abnormal.  No medication changes were made. Please continue all current medications as prescribed.  Your physician recommends that you schedule a follow-up appointment soon for an echo in [place] and in 6 months for an appointment with Dr. Gala Romney. Please contact our office in November for a December appointment   If you have any questions or concerns before your next appointment please send [place] a message through [place] or call our office at [phone].    TO LEAVE A MESSAGE FOR THE NURSE SELECT OPTION 2, PLEASE LEAVE A MESSAGE INCLUDING: . YOUR NAME . DATE OF BIRTH . CALL BACK NUMBER . REASON FOR CALL**this is important as we prioritize the call backs  YOU WILL RECEIVE A CALL BACK THE SAME DAY AS LONG AS YOU CALL BEFORE 4:00 PM   Do the following things EVERYDAY: 1) Weigh yourself in the morning before breakfast. Write it down and keep it in a log. 2) Take your medicines as prescribed 3) Eat low salt foods--Limit salt (sodium) to 2000 mg per day.  4) Stay as active as you can everyday 5) Limit all fluids for the day to less than 2 liters   At the [facility], you and your health needs are our priority. As part of our continuing mission to provide you with exceptional heart care, we have created designated Provider Care Teams. These Care Teams include your primary Cardiologist (physician) and Advanced Practice Providers (APPs- Physician Assistants and Nurse Practitioners) who all work together to provide you with the care you need, when you need it.   You may see any of the following providers on your designated Care Team at your next follow up: Marland Kitchen Dr Arvilla Meres . Dr Marca Ancona . Tonye Becket, NP . Robbie Lis, PA . Karle Plumber, PharmD   Please be sure to bring in all your medications bottles to every appointment.

## 2021-01-18 NOTE — Progress Notes (Signed)
Daily Session Note  Patient Details  Name: Bonnie Ochoa MRN: [id number] Date of Birth: Jun 05, 1966 Referring Provider:   Flowsheet Row Cardiac Rehab from 11/03/2020 in [facility] Cardiac and Pulmonary Rehab  Referring Provider Glori Bickers MD  [facility] Cardiologist: Dr. Ida Rogue      Encounter Date: 01/18/2021  Check In:  Session Check In - 01/18/21 0951      Check-In   Supervising physician immediately available to respond to emergencies See telemetry face sheet for immediately available ER MD    Location [facility]-Cardiac & Pulmonary Rehab    Staff Present Birdie Sons, MPA, Elveria Rising, BA, ACSM CEP, Exercise Physiologist;Joseph Tessie Fass RCP,RRT,BSRT    Virtual Visit No    Medication changes reported     No    Fall or balance concerns reported    No    Warm-up and Cool-down Performed on first and last piece of equipment    Resistance Training Performed Yes    VAD Patient? No    PAD/SET Patient? No      Pain Assessment   Currently in Pain? No/denies              Social History   Tobacco Use  Smoking Status Former Smoker  . Types: Cigarettes  . Quit date: 40  . Years since quitting: 37.4  Smokeless Tobacco Never Used  Tobacco Comment   quit over  20 years ago     Goals Met:  Independence with exercise equipment Exercise tolerated well No report of cardiac concerns or symptoms Strength training completed today  Goals Unmet:  Not Applicable  Comments: Pt able to follow exercise prescription today without complaint.  Will continue to monitor for progression.    Dr. Emily Filbert is Medical Director for [place].  Dr. Ottie Glazier is Medical Director for [facility] Pulmonary Rehabilitation.

## 2021-01-18 NOTE — Progress Notes (Signed)
ADVANCED HF CLINIC NOTE  Referring Provider: Carmela Rima NP  Primary Care: Dr. Rosita Kea Primary Cardiologist: Dr. Rockey Situ  Nephrology: Dr. Billey Chang HF MD: Dr. Haroldine Laws  HPI: Bonnie Ochoa was being referred to the [facility] at the request of Bonnie Bayley NP for heart failure consultation.   Bonnie Ochoa is a 55 year old with a history of NICM, chronic systolic heart failure,St Jude single chamber ICD,  DMII, HTN, bipolar disorder, OSA, PTSD, depression, morbid obesity, former heavy smoker, and cirrhosis. Initially diagnosed with HF in 2008 while living in [place]. In 2017 she relocated to [place] to be closer to her sister.   Admitted to [facility] in October 57, 2019 with chest pain and shortness of breath. Diuresed with IV lasix and later underwent RHC/LHC. She has elevated filling pressures and lowe cardiac index was 1.5. Normal coronaries   Echo 7/21 EF 20-25% RV ok.   Today she returns for HF follow up. She is here with her wife. Feeling good. Going to CR 3x week. Exercising on bike and arm exercises. Also doing a lot of walking. No SOB, edema, orthopnea or PND. CP off/on for last few weeks. About 2x/day. Last couple minutes and goes away.   CorVue 11/21: ERI~5.1 years, no VT/VF, impedence trending back up but has not crossed threshold (personally reviewed).  CPX 07/16/2018  Peak VO2: 11.8 (66% predicted peak VO2) VE/VCO2 slope: 37 OUES: 1.12 Peak RER: 1.09 Moderate limitation with moderately elevated VE/VCO2.   Cardiac Test 05/2018 RHC/LHC   Normal coronary arteries. PA 65/40 Mean 52  PCWP 32 PVR 6.6 CO 3.04 CI 1.56.   ECHO 06/08/2018  Left ventricle: The cavity size was severely dilated. Wall   thickness was at the upper limits of normal. Systolic function   was severely reduced. The estimated ejection fraction was in the   range of 25% to 30%. Diffuse hypokinesis. Features are consistent   with a pseudonormal left ventricular filling pattern, with   concomitant  abnormal relaxation and increased filling pressure   (grade 2 diastolic dysfunction). Doppler parameters are   consistent with high ventricular filling pressure. - Aortic valve: Trileaflet; mildly thickened, mildly calcified   leaflets. There was trivial regurgitation. - Mitral valve: There was at least moderate regurgitation. - Left atrium: The atrium was mildly dilated. - Right ventricle: The cavity size was mildly dilated. Wall   thickness was normal. Systolic function was mildly reduced. - Right atrium: The atrium was mildly dilated. - Tricuspid valve: There was moderate-severe regurgitation. - Pulmonary arteries: Systolic pressure was moderately increased,   in the range of 45 mm Hg to 50 mm Hg. - Inferior vena cava: The vessel was dilated. The respirophasic   diameter changes were blunted (< 50%), consistent with elevated   central venous pressure. - Pericardium, extracardiac: A small pericardial effusion was   identified.    Past Medical History:  Diagnosis Date  . Abdominal pain    a. 05/2018 HIDA scan wnl.  . ADHD   . AICD (automatic cardioverter/defibrillator) present    a. 11/2014 s/p SJM Fortify Assura, single lead AICD (ser# [id number]).  Marland Kitchen Anemia   . Arthritis   . Asthma   . Bipolar 1 disorder ([place])   . Chest pain    a. Hx of cath in [place] - reportedly nl; b. 04/2018 MV: EF 22%, fixed dist ant septal, apical, and inferoapical defects - ? scar vs. attenuation. No ischemia.  . CHF (congestive heart failure) ([place])   .  Cirrhosis of liver ([place])   . Coronary artery disease   . Depression   . Diabetes mellitus without complication ([place])   . Diverticulitis   . Dysrhythmia   . Heart murmur   . HFrEF (heart failure with reduced ejection fraction) ([place])    a. 06/2017 Echo: EF 20-25%, diff HK. Mildly dil LA; b. 05/2018 Echo: EF 25-30%, diff HK, Gr2 DD. Triv AI. Mod MR. Mildly reduced RV fxn. Mod-Sev TR. PASP 45-61mHg.  .Marland KitchenHypertension   . Hypothyroidism   . IBS (irritable  bowel syndrome)   . Insomnia   . Migraines   . NICM ([place]ischemic cardiomyopathy) ([place]    a. EF prev 25%; b. 11/2014 s/p SJM Fortify Assura, single lead AICD (ser# [id number]; c. 06/2017 Echo: EF 20-25%; d. 05/2018 Echo: EF 25-30%, diff HK, Gr2 DD; e. 05/2018 Echo: EF 25-30%; f. 05/2018 Cath: Nl cors. LVEDP 256mg, PCWP 3248m. PA 65/40 (52). CO/CI 3.04/1.56; c. 07/2018 CPX: Mod HF limitation.  . OSA (obstructive sleep apnea)    CPAP is broken  . PTSD (post-traumatic stress disorder)   . Restless leg syndrome   . Vertigo     Current Outpatient Medications  Medication Sig Dispense Refill  . blood glucose meter kit and supplies KIT Dispense based on patient and insurance preference. Use up to four times daily as directed. (FOR ICD-9 250.00, 250.01). 1 each 0  . carvedilol (COREG) 12.5 MG tablet Take 1 tablet (12.5 mg total) by mouth 2 (two) times daily with a meal. 180 tablet 3  . cyclobenzaprine (FLEXERIL) 5 MG tablet TAKE 1 TABLET BY MOUTH THREE TIMES A DAY AS NEEDED FOR MUSCLE SPASMS 60 tablet 1  . digoxin (LANOXIN) 0.125 MG tablet TAKE 1 TABLET BY MOUTH DAILY 90 tablet 3  . empagliflozin (JARDIANCE) 25 MG TABS tablet Take 1 tablet (25 mg total) by mouth daily. (Patient not taking: Reported on 01/10/2021) 90 tablet 1  . famotidine (PEPCID) 40 MG tablet TAKE 1 TABLET BY MOUTH EVERY DAY IN THE EVENING 90 tablet 0  . furosemide (LASIX) 20 MG tablet Take 1 tablet (20 mg total) by mouth as directed. Take 61m4m tablet) Monday, Wednesday, Friday. 40 tablet 3  . insulin glargine (LANTUS SOLOSTAR) 100 UNIT/ML Solostar Pen Inject 35 Units into the skin daily at 10 pm. 15 mL 3  . Insulin Pen Needle 32G X 6 MM MISC 1 each by Does not apply route daily. 100 each 12  . lamoTRIgine (LAMICTAL) 150 MG tablet TAKE 1 TABLET BY MOUTH EVERY DAY 30 tablet 0  . metoCLOPramide (REGLAN) 10 MG tablet Take 10 mg by mouth 4 (four) times daily.    . pantoprazole (PROTONIX) 40 MG tablet TAKE 1 TABLET BY MOUTH EVERY DAY 90  tablet 1  . PARoxetine (PAXIL) 30 MG tablet Take 1 tablet (30 mg total) by mouth daily. 90 tablet 1  . potassium chloride SA (KLOR-CON M20) 20 MEQ tablet TAKE 2 TABLETS (40 MEQ TOTAL) BY MOUTH 2 (TWO) TIMES DAILY. 180 tablet 3  . risperiDONE (RISPERDAL) 0.25 MG tablet TAKE 1 TABLET (0.25 MG TOTAL) BY MOUTH EVERY MORNING. 90 tablet 1  . rosuvastatin (CRESTOR) 40 MG tablet Take 1 tablet (40 mg total) by mouth daily. 90 tablet 3  . sacubitril-valsartan (ENTRESTO) 97-103 MG Take 1 tablet by mouth 2 (two) times daily. 60 tablet 11  . spironolactone (ALDACTONE) 25 MG tablet Take 1 tablet (25 mg total) by mouth daily. 90 tablet 3  . sucralfate (CARAFATE) 1 g tablet  TAKE 1 TABLET (1 G TOTAL) BY MOUTH 4 (FOUR) TIMES DAILY. 360 tablet 3  . zolpidem (AMBIEN) 10 MG tablet TAKE 1 TABLET BY MOUTH AT BEDTIME AS NEEDED FOR SLEEP. 21 tablet 0   No current facility-administered medications for this encounter.    Allergies  Allergen Reactions  . Levothyroxine Rash      Social History   Socioeconomic History  . Marital status: Married    Spouse name: Not on file  . Number of children: 2  . Years of education: Not on file  . Highest education level: High school graduate  Occupational History    Comment: disabled  Tobacco Use  . Smoking status: Former Smoker    Types: Cigarettes    Quit date: 1985    Years since quitting: 37.4  . Smokeless tobacco: Never Used  . Tobacco comment: quit over  20 years ago   Vaping Use  . Vaping Use: Never used  Substance and Sexual Activity  . Alcohol use: Not Currently  . Drug use: Yes    Frequency: 1.0 times per week    Types: Marijuana    Comment: occassionally  . Sexual activity: Yes    Birth control/protection: Surgical    Comment: one partner  Other Topics Concern  . Not on file  Social History Narrative   Volunteers occasionally    Social Determinants of Health   Financial Resource Strain: Low Risk   . Difficulty of Paying Living Expenses: Not  hard at all  Food Insecurity: Food Insecurity Present  . Worried About Charity fundraiser in the Last Year: Sometimes true  . Ran Out of Food in the Last Year: Sometimes true  Transportation Needs: No Transportation Needs  . Lack of Transportation (Medical): No  . Lack of Transportation (Non-Medical): No  Physical Activity: Insufficiently Active  . Days of Exercise per Week: 3 days  . Minutes of Exercise per Session: 30 min  Stress: No Stress Concern Present  . Feeling of Stress : Not at all  Social Connections: Not on file  Intimate Partner Violence: Not on file      Family History  Problem Relation Age of Onset  . Hypertension Mother   . Hyperlipidemia Mother   . Heart disease Mother   . Hypertension Sister   . Asthma Sister   . Heart disease Sister   . Diabetes Sister   . Cancer Sister   . Alzheimer's disease Maternal Grandfather   . Hyperlipidemia Brother   . Asthma Sister   . Hypertension Sister   . Diabetes Sister   . Breast cancer Paternal Aunt     Vitals:   01/18/21 1449  BP: 118/79  Pulse: 78  SpO2: 95%  Weight: 83.6 kg (184 lb 6.4 oz)   Wt Readings from Last 3 Encounters:  01/10/21 82.8 kg (182 lb 9.6 oz)  11/03/20 80.8 kg (178 lb 3.2 oz)  10/13/20 80.4 kg (177 lb 3.2 oz)    PHYSICAL EXAM: General:  Well appearing. No resp difficulty HEENT: normal Neck: supple. no JVD. Carotids 2+ bilat; no bruits. No lymphadenopathy or thryomegaly appreciated. Cor: PMI nondisplaced. Regular rate & rhythm. No rubs, gallops or murmurs. Lungs: clear Abdomen: soft, nontender, nondistended. No hepatosplenomegaly. No bruits or masses. Good bowel sounds. Extremities: no cyanosis, clubbing, rash, edema Neuro: alert & orientedx3, cranial nerves grossly intact. moves all 4 extremities w/o difficulty. Affect pleasant  ICD interrogation: No VT/AF. Fluid dry. Activity level 4-8 hours. Personally reviewed  ASSESSMENT &  PLAN:  1. Chronic Systolic/Diastolic  Heart Failure ,  NICM. St Jude single chamber ICD. 05/2018 RHC/LHC cors normal. Cardiac output 3.0 Cardiac Index 1.56.  - CPX 07/2018 -slope 37, RER 1.1  VO 2 11.8  - ECHO 06/08/2018 EF 25-30% Grade II DD. - Echo 7/21 EF 20-25% RV ok.  - Echo 12/21 EF 25-30% - She is improved. NYHA II at worst. Euvolemic on exam today - Continue spironolactone to 25 mg daily.  - Continue carvedilol 12.5 mg twice a day. - Continue digoxin 0.125 mg daily.  - Continue Jardiance 25 daily  - Change Lasix 20 mg to M/W/F, OK to take extra if needed - Continue Entresto 97/103 bid mg - set up CR in [place] - Repeat Echo in [place] - ICD interrogated personally    2. HTN  - Blood pressure well controlled. Continue current regimen.  3. CKD Stage II  - repent SCr up slightly at 1.5. may be a little dry (vs SGLT2 effect) - recheck today  4. DMII - On Jardiance 25 - On insulin per PCP.   5. OSA - CPAP at home but machine broken - follow back with sleep medicine    Glori Bickers MD 1:06 PM

## 2021-01-19 ENCOUNTER — Telehealth (INDEPENDENT_AMBULATORY_CARE_PROVIDER_SITE_OTHER): Payer: Medicare Other | Admitting: Psychiatry

## 2021-01-19 DIAGNOSIS — Z5329 Procedure and treatment not carried out because of patient's decision for other reasons: Secondary | ICD-10-CM

## 2021-01-19 NOTE — Progress Notes (Signed)
No response to call or text or video invite  

## 2021-01-20 ENCOUNTER — Ambulatory Visit: Payer: Medicare Other

## 2021-01-20 ENCOUNTER — Other Ambulatory Visit: Payer: Self-pay

## 2021-01-20 DIAGNOSIS — Z87891 Personal history of nicotine dependence: Secondary | ICD-10-CM | POA: Diagnosis not present

## 2021-01-20 DIAGNOSIS — I5022 Chronic systolic (congestive) heart failure: Secondary | ICD-10-CM

## 2021-01-20 NOTE — Progress Notes (Signed)
Daily Session Note  Patient Details  Name: Bonnie Ochoa MRN: [id number] Date of Birth: 05/04/1966 Referring Provider:   Flowsheet Row Cardiac Rehab from 11/03/2020 in [facility] Cardiac and Pulmonary Rehab  Referring Provider Glori Bickers MD  [facility] Cardiologist: Dr. Ida Rogue       Encounter Date: 01/20/2021  Check In:  Session Check In - 01/20/21 0951       Check-In   Supervising physician immediately available to respond to emergencies See telemetry face sheet for immediately available ER MD    Location [facility]-Cardiac & Pulmonary Rehab    Staff Present Justin Mend RCP,RRT,BSRT;Vida Rigger RN, BSN;Jessica Luan Pulling, MA, RCEP, CCRP, CCET    Virtual Visit No    Medication changes reported     No    Fall or balance concerns reported    No    Warm-up and Cool-down Performed on first and last piece of equipment    Resistance Training Performed Yes    VAD Patient? No    PAD/SET Patient? No      Pain Assessment   Currently in Pain? No/denies                Social History   Tobacco Use  Smoking Status Former   Pack years: 0.00   Types: Cigarettes   Quit date: 1985   Years since quitting: 37.4  Smokeless Tobacco Never  Tobacco Comments   quit over  20 years ago     Goals Met:  Independence with exercise equipment Exercise tolerated well No report of cardiac concerns or symptoms Strength training completed today  Goals Unmet:  Not Applicable  Comments: Pt able to follow exercise prescription today without complaint.  Will continue to monitor for progression.   Dr. Emily Filbert is Medical Director for [place].  Dr. Ottie Glazier is Medical Director for [facility] Pulmonary Rehabilitation.

## 2021-01-23 ENCOUNTER — Other Ambulatory Visit: Payer: Self-pay

## 2021-01-23 ENCOUNTER — Encounter: Payer: Medicare Other | Admitting: *Deleted

## 2021-01-23 ENCOUNTER — Ambulatory Visit: Payer: Medicare Other

## 2021-01-23 DIAGNOSIS — I5022 Chronic systolic (congestive) heart failure: Secondary | ICD-10-CM

## 2021-01-23 DIAGNOSIS — Z87891 Personal history of nicotine dependence: Secondary | ICD-10-CM | POA: Diagnosis not present

## 2021-01-23 NOTE — Progress Notes (Signed)
Daily Session Note  Patient Details  Name: Bonnie Ochoa MRN: [id number] Date of Birth: [birth year] Referring Provider:   Flowsheet Row Cardiac Rehab from 11/03/2020 in [facility] Cardiac and Pulmonary Rehab  Referring Provider Glori Bickers MD  [facility] Cardiologist: Dr. Ida Rogue       Encounter Date: 01/23/2021  Check In:  Session Check In - 01/23/21 1349       Check-In   Supervising physician immediately available to respond to emergencies See telemetry face sheet for immediately available ER MD    Location [facility]-Cardiac & Pulmonary Rehab    Staff Present Heath Lark, RN, BSN, CCRP;Joseph Lou Miner, MS, ASCM CEP, Exercise Physiologist    Virtual Visit No    Medication changes reported     No    Fall or balance concerns reported    No    Warm-up and Cool-down Performed on first and last piece of equipment    Resistance Training Performed Yes    VAD Patient? No    PAD/SET Patient? No      Pain Assessment   Currently in Pain? No/denies                Social History   Tobacco Use  Smoking Status Former   Pack years: 0.00   Types: Cigarettes   Quit date: 1985   Years since quitting: 37.4  Smokeless Tobacco Never  Tobacco Comments   quit over  20 years ago     Goals Met:  Independence with exercise equipment Exercise tolerated well No report of cardiac concerns or symptoms  Goals Unmet:  Not Applicable  Comments: Pt able to follow exercise prescription today without complaint.  Will continue to monitor for progression.    Dr. Emily Filbert is Medical Director for [place].  Dr. Ottie Glazier is Medical Director for [facility] Pulmonary Rehabilitation.

## 2021-01-25 ENCOUNTER — Encounter: Payer: Self-pay | Admitting: *Deleted

## 2021-01-25 ENCOUNTER — Ambulatory Visit: Payer: Medicare Other

## 2021-01-25 DIAGNOSIS — I5022 Chronic systolic (congestive) heart failure: Secondary | ICD-10-CM

## 2021-01-25 NOTE — Progress Notes (Signed)
Cardiac Individual Treatment Plan  Patient Details  Name: Bonnie Ochoa MRN: [id number] Date of Birth: April 20, 1966 Referring Provider:   Flowsheet Row Cardiac Rehab from 11/03/2020 in [facility] Cardiac and Pulmonary Rehab  Referring Provider Glori Bickers MD  [facility] Cardiologist: Dr. Ida Rogue       Initial Encounter Date:  Flowsheet Row Cardiac Rehab from 11/03/2020 in [facility] Cardiac and Pulmonary Rehab  Date 11/03/20       Visit Diagnosis: Chronic systolic heart failure ([place])  Patient's Home Medications on Admission:  Current Outpatient Medications:    blood glucose meter kit and supplies KIT, Dispense based on patient and insurance preference. Use up to four times daily as directed. (FOR ICD-9 250.00, 250.01)., Disp: 1 each, Rfl: 0   carvedilol (COREG) 12.5 MG tablet, Take 1 tablet (12.5 mg total) by mouth 2 (two) times daily with a meal., Disp: 180 tablet, Rfl: 3   cyclobenzaprine (FLEXERIL) 5 MG tablet, TAKE 1 TABLET BY MOUTH THREE TIMES A DAY AS NEEDED FOR MUSCLE SPASMS, Disp: 60 tablet, Rfl: 1   digoxin (LANOXIN) 0.125 MG tablet, TAKE 1 TABLET BY MOUTH DAILY, Disp: 90 tablet, Rfl: 3   empagliflozin (JARDIANCE) 25 MG TABS tablet, Take 1 tablet (25 mg total) by mouth daily., Disp: 90 tablet, Rfl: 1   famotidine (PEPCID) 40 MG tablet, TAKE 1 TABLET BY MOUTH EVERY DAY IN THE EVENING, Disp: 90 tablet, Rfl: 0   furosemide (LASIX) 20 MG tablet, Take 1 tablet (20 mg total) by mouth as directed. Take 15m (1 tablet) Monday, Wednesday, Friday., Disp: 40 tablet, Rfl: 3   insulin glargine (LANTUS SOLOSTAR) 100 UNIT/ML Solostar Pen, Inject 35 Units into the skin daily at 10 pm., Disp: 15 mL, Rfl: 3   Insulin Pen Needle 32G X 6 MM MISC, 1 each by Does not apply route daily., Disp: 100 each, Rfl: 12   lamoTRIgine (LAMICTAL) 150 MG tablet, TAKE 1 TABLET BY MOUTH EVERY DAY, Disp: 30 tablet, Rfl: 0   metoCLOPramide (REGLAN) 10 MG tablet, Take 10 mg by mouth 4 (four) times daily., Disp: , Rfl:     pantoprazole (PROTONIX) 40 MG tablet, TAKE 1 TABLET BY MOUTH EVERY DAY, Disp: 90 tablet, Rfl: 1   PARoxetine (PAXIL) 30 MG tablet, Take 1 tablet (30 mg total) by mouth daily., Disp: 90 tablet, Rfl: 1   potassium chloride SA (KLOR-CON M20) 20 MEQ tablet, TAKE 2 TABLETS (40 MEQ TOTAL) BY MOUTH 2 (TWO) TIMES DAILY., Disp: 180 tablet, Rfl: 3   risperiDONE (RISPERDAL) 0.25 MG tablet, TAKE 1 TABLET (0.25 MG TOTAL) BY MOUTH EVERY MORNING., Disp: 90 tablet, Rfl: 1   rosuvastatin (CRESTOR) 40 MG tablet, Take 1 tablet (40 mg total) by mouth daily., Disp: 90 tablet, Rfl: 3   sacubitril-valsartan (ENTRESTO) 97-103 MG, Take 1 tablet by mouth 2 (two) times daily., Disp: 60 tablet, Rfl: 11   spironolactone (ALDACTONE) 25 MG tablet, Take 1 tablet (25 mg total) by mouth daily., Disp: 90 tablet, Rfl: 3   sucralfate (CARAFATE) 1 g tablet, TAKE 1 TABLET (1 G TOTAL) BY MOUTH 4 (FOUR) TIMES DAILY., Disp: 360 tablet, Rfl: 3   zolpidem (AMBIEN) 10 MG tablet, TAKE 1 TABLET BY MOUTH AT BEDTIME AS NEEDED FOR SLEEP., Disp: 21 tablet, Rfl: 0  Past Medical History: Past Medical History:  Diagnosis Date   Abdominal pain    a. 05/2018 HIDA scan wnl.   ADHD    AICD (automatic cardioverter/defibrillator) present    a. 11/2014 s/p SJM Fortify Assura, single lead  AICD (ser# [id number]).   Anemia    Arthritis    Asthma    Bipolar 1 disorder ([place])    Chest pain    a. Hx of cath in [place] - reportedly nl; b. 04/2018 MV: EF 22%, fixed dist ant septal, apical, and inferoapical defects - ? scar vs. attenuation. No ischemia.   CHF (congestive heart failure) (HCC)    Cirrhosis of liver ([place])    Coronary artery disease    Depression    Diabetes mellitus without complication ([place])    Diverticulitis    Dysrhythmia    Heart murmur    HFrEF (heart failure with reduced ejection fraction) ([place])    a. 06/2017 Echo: EF 20-25%, diff HK. Mildly dil LA; b. 05/2018 Echo: EF 25-30%, diff HK, Gr2 DD. Triv AI. Mod MR. Mildly reduced RV fxn.  Mod-Sev TR. PASP 45-66mHg.   Hypertension    Hypothyroidism    IBS (irritable bowel syndrome)    Insomnia    Migraines    NICM (nonischemic cardiomyopathy) ([place]    a. EF prev 25%; b. 11/2014 s/p SJM Fortify Assura, single lead AICD (ser# [id number]; c. 06/2017 Echo: EF 20-25%; d. 05/2018 Echo: EF 25-30%, diff HK, Gr2 DD; e. 05/2018 Echo: EF 25-30%; f. 05/2018 Cath: Nl cors. LVEDP 2110mg, PCWP 3258m. PA 65/40 (52). CO/CI 3.04/1.56; c. 07/2018 CPX: Mod HF limitation.   OSA (obstructive sleep apnea)    CPAP is broken   PTSD (post-traumatic stress disorder)    Restless leg syndrome    Vertigo     Tobacco Use: Social History   Tobacco Use  Smoking Status Former   Pack years: 0.00   Types: Cigarettes   Quit date: 1985   Years since quitting: 37.4  Smokeless Tobacco Never  Tobacco Comments   quit over  20 years ago     Labs: Recent RevChemical engineer  Labs for ITP Cardiac and Pulmonary Rehab Latest Ref Rng & Units 09/28/2019 12/25/2019 04/06/2020 07/11/2020 01/10/2021   Cholestrol 100 - 199 mg/dL 214(H) - - 152 188   LDLCALC 0 - 99 mg/dL 121(H) - - 84 93   HDL >39 mg/dL 36(L) - - 39(L) 35(L)   Trlycerides 0 - 149 mg/dL 321(H) - - 168(H) 359(H)   Hemoglobin A1c <7.0 % 9.9(H) 10.1(H) 8.5(H) 6.8 8.7(H)        Exercise Target Goals: Exercise Program Goal: Individual exercise prescription set using results from initial 6 min walk test and THRR while considering  patient's activity barriers and safety.   Exercise Prescription Goal: Initial exercise prescription builds to 30-45 minutes a day of aerobic activity, 2-3 days per week.  Home exercise guidelines will be given to patient during program as part of exercise prescription that the participant will acknowledge.   Education: Aerobic Exercise: - Group verbal and visual presentation on the components of exercise prescription. Introduces F.I.T.T principle from ACSM for exercise prescriptions.  Reviews F.I.T.T. principles of  aerobic exercise including progression. Written material given at graduation. [facility]  Education need identified 11/03/20       Education: Resistance Exercise: - Group verbal and visual presentation on the components of exercise prescription. Introduces F.I.T.T principle from ACSM for exercise prescriptions  Reviews F.I.T.T. principles of resistance exercise including progression. Written material given at graduation.    Education: Exercise & Equipment Safety: - Individual verbal instruction and demonstration of equipment use and safety with use of  the equipment. Flowsheet Row Cardiac Rehab from 01/11/2021 in [facility] Cardiac and Pulmonary Rehab  Date 11/03/20  Educator [facility]  Instruction Review Code 1- Verbalizes Understanding       Education: Exercise Physiology & General Exercise Guidelines: - Group verbal and written instruction with models to review the exercise physiology of the cardiovascular system and associated critical values. Provides general exercise guidelines with specific guidelines to those with heart or lung disease.    Education: Flexibility, Balance, Mind/Body Relaxation: - Group verbal and visual presentation with interactive activity on the components of exercise prescription. Introduces F.I.T.T principle from ACSM for exercise prescriptions. Reviews F.I.T.T. principles of flexibility and balance exercise training including progression. Also discusses the mind body connection.  Reviews various relaxation techniques to help reduce and manage stress (i.e. Deep breathing, progressive muscle relaxation, and visualization). Balance handout provided to take home. Written material given at graduation. Flowsheet Row Cardiac Rehab from 01/11/2021 in [facility] Cardiac and Pulmonary Rehab  Date 12/14/20  Educator AS  Instruction Review Code 1- Verbalizes Understanding       Activity Barriers & Risk Stratification:   Activity Barriers & Cardiac Risk Stratification - 11/03/20 1312       Activity Barriers & Cardiac Risk Stratification   Activity Barriers Chest Pain/Angina;Back Problems;Muscular Weakness;Deconditioning;Balance Concerns    Cardiac Risk Stratification High             6 Minute Walk:  6 Minute Walk     Row Name 11/03/20 1311         6 Minute Walk   Phase Initial     Distance 1095 feet     Walk Time 6 minutes     # of Rest Breaks 0     MPH 2.07     METS 3.1     RPE 13     VO2 Peak 10.86     Symptoms Yes (comment)     Comments R thigh pain 4/10     Resting HR 59 bpm     Resting BP 142/84     Resting Oxygen Saturation  97 %     Exercise Oxygen Saturation  during 6 min walk 99 %     Max Ex. HR 81 bpm     Max Ex. BP 154/86     2 Minute Post BP 138/80              Oxygen Initial Assessment:   Oxygen Re-Evaluation:   Oxygen Discharge (Final Oxygen Re-Evaluation):   Initial Exercise Prescription:  Initial Exercise Prescription - 11/03/20 1300       Date of Initial Exercise RX and Referring Provider   Date 11/03/20    Referring Provider Glori Bickers MD   Primary Cardiologist: Dr. Ida Rogue     Treadmill   MPH 2    Grade 1    Minutes 15    METs 2.81      Recumbant Bike   Level 2    RPM 50    Watts 28    Minutes 15    METs 2      Arm Ergometer   Level 1    Watts 25    RPM 25    Minutes 15    METs 2      REL-XR   Level 3    Speed 50    Minutes 15    METs 2      Prescription Details   Frequency (times per week) 3  Duration Progress to 30 minutes of continuous aerobic without signs/symptoms of physical distress      Intensity   THRR 40-80% of Max Heartrate 101-143    Ratings of Perceived Exertion 11-13    Perceived Dyspnea 0-4      Progression   Progression Continue to progress workloads to maintain intensity without signs/symptoms of physical distress.      Resistance Training   Training Prescription Yes    Weight 3 lb     Reps 10-15             Perform Capillary Blood Glucose checks as needed.  Exercise Prescription Changes:   Exercise Prescription Changes     Row Name 11/03/20 1300 12/21/20 1100 12/26/20 1100 01/10/21 1600 01/25/21 0700     Response to Exercise   Blood Pressure (Admit) 142/84 -- 122/72 114/70 118/62   Blood Pressure (Exercise) 154/86 -- 118/70 142/78 128/64   Blood Pressure (Exit) 138/80 -- 108/62 108/64 102/62   Heart Rate (Admit) 59 bpm -- 85 bpm 78 bpm 66 bpm   Heart Rate (Exercise) 81 bpm -- 88 bpm 105 bpm 75 bpm   Heart Rate (Exit) 70 bpm -- 85 bpm 85 bpm 75 bpm   Oxygen Saturation (Admit) 97 % -- -- -- --   Oxygen Saturation (Exercise) 99 % -- -- -- --   Rating of Perceived Exertion (Exercise) 13 -- 15 15 13    Symptoms r thigh pain 4/10 -- none none none   Comments walk test results -- -- -- --   Duration -- -- Progress to 30 minutes of  aerobic without signs/symptoms of physical distress Continue with 30 min of aerobic exercise without signs/symptoms of physical distress. Progress to 30 minutes of  aerobic without signs/symptoms of physical distress   Intensity -- -- THRR unchanged THRR unchanged THRR unchanged     Progression   Progression -- -- Continue to progress workloads to maintain intensity without signs/symptoms of physical distress. Continue to progress workloads to maintain intensity without signs/symptoms of physical distress. Continue to progress workloads to maintain intensity without signs/symptoms of physical distress.   Average METs -- -- 3 2.47 2.23     Resistance Training   Training Prescription -- -- Yes Yes Yes   Weight -- -- 3 lb 3 lb 3 lb   Reps -- -- 10-15 10-15 10-15     Interval Training   Interval Training -- -- No No No     NuStep   Level -- -- -- 4 --   Minutes -- -- -- 15 --   METs -- -- -- 1.4 --     Arm Ergometer   Level -- -- 1 1 1    Minutes -- -- 15 15 15    METs -- -- 2.55 1.6 2.6     Track   Laps -- -- 45 41 37    Minutes -- -- -- 15 15   METs -- -- 3.4 2.66 2.95     Home Exercise Plan   Plans to continue exercise at -- Home (comment)  walking Home (comment)  walking Home (comment)  walking Home (comment)  walking   Frequency -- Add 2 additional days to program exercise sessions. Add 2 additional days to program exercise sessions. Add 2 additional days to program exercise sessions. Add 2 additional days to program exercise sessions.   Initial Home Exercises Provided -- 12/21/20 12/21/20 12/21/20 12/21/20            Exercise Comments:  Exercise Comments     Row Name 12/14/20 1102           Exercise Comments First full day of exercise!  Patient was oriented to gym and equipment including functions, settings, policies, and procedures.  Patient's individual exercise prescription and treatment plan were reviewed.  All starting workloads were established based on the results of the 6 minute walk test done at initial orientation visit.  The plan for exercise progression was also introduced and progression will be customized based on patient's performance and goals.                Exercise Goals and Review:   Exercise Goals     Row Name 11/03/20 1315             Exercise Goals   Increase Physical Activity Yes       Intervention Provide advice, education, support and counseling about physical activity/exercise needs.;Develop an individualized exercise prescription for aerobic and resistive training based on initial evaluation findings, risk stratification, comorbidities and participant's personal goals.       Expected Outcomes Short Term: Attend rehab on a regular basis to increase amount of physical activity.;Long Term: Add in home exercise to make exercise part of routine and to increase amount of physical activity.;Long Term: Exercising regularly at least 3-5 days a week.       Increase Strength and Stamina Yes       Intervention Provide advice, education, support and counseling about  physical activity/exercise needs.;Develop an individualized exercise prescription for aerobic and resistive training based on initial evaluation findings, risk stratification, comorbidities and participant's personal goals.       Expected Outcomes Short Term: Increase workloads from initial exercise prescription for resistance, speed, and METs.;Long Term: Improve cardiorespiratory fitness, muscular endurance and strength as measured by increased METs and functional capacity (6MWT);Short Term: Perform resistance training exercises routinely during rehab and add in resistance training at home       Able to understand and use rate of perceived exertion (RPE) scale Yes       Intervention Provide education and explanation on how to use RPE scale       Expected Outcomes Short Term: Able to use RPE daily in rehab to express subjective intensity level;Long Term:  Able to use RPE to guide intensity level when exercising independently       Able to understand and use Dyspnea scale Yes       Intervention Provide education and explanation on how to use Dyspnea scale       Expected Outcomes Short Term: Able to use Dyspnea scale daily in rehab to express subjective sense of shortness of breath during exertion;Long Term: Able to use Dyspnea scale to guide intensity level when exercising independently       Knowledge and understanding of Target Heart Rate Range (THRR) Yes       Intervention Provide education and explanation of THRR including how the numbers were predicted and where they are located for reference       Expected Outcomes Short Term: Able to state/look up THRR;Short Term: Able to use daily as guideline for intensity in rehab;Long Term: Able to use THRR to govern intensity when exercising independently       Able to check pulse independently Yes       Intervention Provide education and demonstration on how to check pulse in carotid and radial arteries.;Review the importance of being able to check your own  pulse for safety  during independent exercise       Expected Outcomes Short Term: Able to explain why pulse checking is important during independent exercise;Long Term: Able to check pulse independently and accurately       Understanding of Exercise Prescription Yes       Intervention Provide education, explanation, and written materials on patient's individual exercise prescription       Expected Outcomes Short Term: Able to explain program exercise prescription;Long Term: Able to explain home exercise prescription to exercise independently                Exercise Goals Re-Evaluation :  Exercise Goals Re-Evaluation     Row Name 11/15/20 1610 12/14/20 1102 12/21/20 1125 12/26/20 1134 01/10/21 1614     Exercise Goal Re-Evaluation   Exercise Goals Review -- Increase Physical Activity;Understanding of Exercise Prescription;Knowledge and understanding of Target Heart Rate Range (THRR);Able to understand and use rate of perceived exertion (RPE) scale;Increase Strength and Stamina;Able to understand and use Dyspnea scale;Able to check pulse independently Increase Physical Activity;Understanding of Exercise Prescription;Knowledge and understanding of Target Heart Rate Range (THRR);Able to understand and use rate of perceived exertion (RPE) scale;Increase Strength and Stamina;Able to understand and use Dyspnea scale;Able to check pulse independently Increase Physical Activity;Increase Strength and Stamina Increase Physical Activity;Increase Strength and Stamina;Understanding of Exercise Prescription   Comments Has yet to start exercise sessions due to new job Reviewed RPE and dyspnea scales, THR and program prescription with pt today.  Pt voiced understanding and was given a copy of goals to take home. Reviewed home exercise with pt today.  Pt plans to walk for exercise.   Reviewed THR, pulse, RPE, sign and symptoms, pulse oximetery and when to call 911 or MD.  Also discussed weather considerations and  indoor options.  Pt voiced understanding. Bonnie Ochoa is doing well and walked 45 laps on the track!  The arm cracnk is hard for her.  Staff will monitor progress. Bonnie Ochoa continues to do well in rehab.  Her track laps very between 22-41.  We will encourage more consistency on speed and monitor her progress.   Expected Outcomes -- Short: Use RPE daily to regulate intensity. Long: Follow program prescription in THR. Short: Practice taking HR and/or purchase pulse ox and monitor HR during exercise Long: Follow appropriate exercise presciption at home Short: attend consistently Long:  build overall stamina Short: Walk at constant speed on track Long: Continue to improve stamina    Row Name 01/13/21 0958 01/25/21 0745           Exercise Goal Re-Evaluation   Exercise Goals Review Increase Physical Activity;Increase Strength and Stamina Increase Physical Activity;Increase Strength and Stamina      Comments Bonnie Ochoa is walking at home for exercise. She is considering joining a gym when she is done with the program. Informed her that walking at home is good but to make sure she is getting her heart rate up enough. Bonnie Ochoa is up to level 5 on Nustep.  She got 37 laps walking track.  She works at [employer] 11[employer] but doesnt reach THR.      Expected Outcomes Short: walk faster if it is to easy. Long: maintain walking at home post [employer] [facility]. Short: work toward [place]:  increase stamina               Discharge Exercise Prescription (Final Exercise Prescription Changes):  Exercise Prescription Changes - 01/25/21 0700       Response to Exercise   Blood Pressure (  Admit) 118/62    Blood Pressure (Exercise) 128/64    Blood Pressure (Exit) 102/62    Heart Rate (Admit) 66 bpm    Heart Rate (Exercise) 75 bpm    Heart Rate (Exit) 75 bpm    Rating of Perceived Exertion (Exercise) 13    Symptoms none    Duration Progress to 30 minutes of  aerobic without signs/symptoms of physical distress    Intensity THRR unchanged       Progression   Progression Continue to progress workloads to maintain intensity without signs/symptoms of physical distress.    Average METs 2.23      Resistance Training   Training Prescription Yes    Weight 3 lb    Reps 10-15      Interval Training   Interval Training No      Arm Ergometer   Level 1    Minutes 15    METs 2.6      Track   Laps 37    Minutes 15    METs 2.95      Home Exercise Plan   Plans to continue exercise at Home (comment)   walking   Frequency Add 2 additional days to program exercise sessions.    Initial Home Exercises Provided 12/21/20             Nutrition:  Target Goals: Understanding of nutrition guidelines, daily intake of sodium <1559m, cholesterol <2095m calories 30% from fat and 7% or less from saturated fats, daily to have 5 or more servings of fruits and vegetables.  Education: All About Nutrition: -Group instruction provided by verbal, written material, interactive activities, discussions, models, and posters to present general guidelines for heart healthy nutrition including fat, fiber, MyPlate, the role of sodium in heart healthy nutrition, utilization of the nutrition label, and utilization of this knowledge for meal planning. Follow up email sent as well. Written material given at graduation. Flowsheet [facility]  Education need identified 11/03/20       Biometrics:  Pre Biometrics - 11/03/20 1315       Pre Biometrics   Height 5' 5"  (1.651 m)    Weight 178 lb 3.2 oz (80.8 kg)    BMI (Calculated) 29.65    Single Leg Stand 10 seconds              Nutrition Therapy Plan and Nutrition Goals:  Nutrition Therapy & Goals - 12/28/20 1047       Nutrition Therapy   RD appointment deferred Yes   Jade would not like to talk to RD at this time - will continue to check in.            Nutrition Assessments:  MEDIFICTS Score Key: ?70 Need to make dietary changes   40-70 Heart Healthy Diet ? 40 Therapeutic Level Cholesterol Diet  [facility]  Picture Your Plate Total Score on Admission 62      Picture Your Plate Scores: <4<93nhealthy dietary pattern with much room for improvement. 41-50 Dietary pattern unlikely to meet recommendations for good health and room for improvement. 51-60 More healthful dietary pattern, with some room for improvement.  >60 Healthy dietary pattern, although there may be some specific behaviors that could be improved.    Nutrition Goals Re-Evaluation:  Nutrition Goals Re-Evaluation     [place] 12/21/20 1144 01/13/21 1002  Goals   Current Weight -- 185 lb (83.9 kg)      Nutrition Goal -- Meet with the dietician.      Comment Bonnie Ochoa has not met with Melissa, RD yet to establish nutrition goals. She did not meet yet as she missed her appt and will reschedule to get a new one. Patient states that she ate a breakfast sandwich this morning from a fast fookd restaruant. She has a sandwich and chips for lunch. She will eat steak, chicken and some vegetables.      Expected Outcome Short: Get appointment with RD Long: Maintain heart healthy diet Short: meet with the dietician. Long: adhere to a diet that pertains to her needs.               Nutrition Goals Discharge (Final Nutrition Goals Re-Evaluation):  Nutrition Goals Re-Evaluation - 01/13/21 1002       Goals   Current Weight 185 lb (83.9 kg)    Nutrition Goal Meet with the dietician.    Comment Patient states that she ate a breakfast sandwich this morning from a fast fookd restaruant. She has a sandwich and chips for lunch. She will eat steak, chicken and some vegetables.    Expected Outcome Short: meet with the dietician. Long: adhere to a diet that pertains to her needs.             Psychosocial: Target Goals: Acknowledge presence or absence of significant depression and/or  stress, maximize coping skills, provide positive support system. Participant is able to verbalize types and ability to use techniques and skills needed for reducing stress and depression.   Education: Stress, Anxiety, and Depression - Group verbal and visual presentation to define topics covered.  Reviews how body is impacted by stress, anxiety, and depression.  Also discusses healthy ways to reduce stress and to treat/manage anxiety and depression.  Written material given at graduation. Flowsheet Row Cardiac Rehab from 01/11/2021 in [facility] Cardiac and Pulmonary Rehab  Education need identified 11/03/20       Education: Sleep Hygiene -Provides group verbal and written instruction about how sleep can affect your health.  Define sleep hygiene, discuss sleep cycles and impact of sleep habits. Review good sleep hygiene tips.    Initial Review & Psychosocial Screening:  Initial Psych Review & Screening - 10/26/20 1048       Initial Review   Current issues with Current Depression;Current Psychotropic Meds      Family Dynamics   Good Support System? Yes   wife Darryll Capers, sister, children live in [place]     Barriers   Psychosocial barriers to participate in program There are no identifiable barriers or psychosocial needs.      Screening Interventions   Interventions Encouraged to exercise;To provide support and resources with identified psychosocial needs;Provide feedback about the scores to participant    Expected Outcomes Short Term goal: Utilizing psychosocial counselor, staff and physician to assist with identification of specific Stressors or current issues interfering with healing process. Setting desired goal for each stressor or current issue identified.;Long Term Goal: Stressors or current issues are controlled or eliminated.;Short Term goal: Identification and review with participant of any Quality of Life or Depression concerns found by scoring the questionnaire.;Long Term goal: The  participant improves quality of Life and PHQ9 Scores as seen by post scores and/or verbalization of changes             Quality of Life Scores:   Quality of Life - 11/03/20 1315  Quality of Life   Select Quality of Life      Quality of Life Scores   Health/Function Pre 17.5 %    Socioeconomic Pre 18.56 %    Psych/Spiritual Pre 25.14 %    Family Pre 25.2 %    GLOBAL Pre 20.37 %            Scores of 19 and below usually indicate a poorer quality of life in these areas.  A difference of  2-3 points is a clinically meaningful difference.  A difference of 2-3 points in the total score of the Quality of Life Index has been associated with significant improvement in overall quality of life, self-image, physical symptoms, and general health in studies assessing change in quality of life.  PHQ-9: Recent Review Flowsheet Data     Depression screen [facility] 2/9 01/10/2021 11/03/2020 09/09/2020 07/11/2020 09/28/2019   Decreased Interest 1 0 0 2 0   Down, Depressed, Hopeless 2 2 0 1 0   PHQ - 2 Score 3 2 0 3 0   Altered sleeping 0 0 - 0 1   Tired, decreased energy 1 1 - 3 0   Change in appetite 0 0 - 0 0   Feeling bad or failure about yourself  2 2 - 0 0   Trouble concentrating 1 2 - 0 0   Moving slowly or fidgety/restless 3 0 - 0 0   Suicidal thoughts 0 0 - 0 0   PHQ-9 Score 10 7 - 6 1   Difficult doing work/chores Not difficult at all Not difficult at all - - Not difficult at all      Interpretation of Total Score  Total Score Depression Severity:  1-4 = Minimal depression, 5-9 = Mild depression, 10-14 = Moderate depression, 15-19 = Moderately severe depression, 20-27 = Severe depression   Psychosocial Evaluation and Intervention:  Psychosocial Evaluation - 10/26/20 1111       Psychosocial Evaluation & Interventions   Interventions Encouraged to exercise with the program and follow exercise prescription    Comments Bonnie Ochoa has no barriers to starting the program. She has  history of depression and is under the care of physician and has Paxil prscribed. She moved to [place] in the past 5 years with he sister. SHe is married for 3 years to her wife LaTricia.  She has grown children that live in [place]. Her wife aand sister are her support system. THey all live together in a home with no pets. Her goal is to increase her energy. She should do well in the program.    Expected Outcomes STG: Bonnie Ochoa attends all scheduled sessions, and is able to progress her exercise prescription over time. LTG: she has gained more energy/stamina and is able to maintain this with continuing what she learned in the program. [facility] keeps her depression under control with continued meds and counseling    Continue Psychosocial Services  Follow up required by staff             Psychosocial Re-Evaluation:  Psychosocial Re-Evaluation     [place] Name 12/21/20 1140 01/13/21 1007           Psychosocial Re-Evaluation   Current issues with Current Depression;Current Psychotropic Meds Current Depression;History of Depression;Current Psychotropic Meds      Comments Bonnie Ochoa reports that she is doing well. She continues to have ongoing support from her wife and kids. She is currently on medication for depression and is in the process of switching providers  for a therapist. She reports the medication is helping and does not think she needs any other intervention at this time. Reports that sleep is good and has no complaints. Bonnie Ochoa feels like she is depressed since her mother died in Oct 15, 2012. She has no other concernes since last follow up.      Expected Outcomes Short: Establish new provider for therapy Long: Utilize exercise for stress management and maintain positive attitude Short: Continue to exercise regularly to support mental health and notify staff of any changes. Long: maintain mental health and well being through teaching of rehab or prescribed medications independently.      Interventions Encouraged to attend  Cardiac Rehabilitation for the exercise Encouraged to attend Cardiac Rehabilitation for the exercise      Continue Psychosocial Services  Follow up required by staff Follow up required by staff               Psychosocial Discharge (Final Psychosocial Re-Evaluation):  Psychosocial Re-Evaluation - 01/13/21 1007       Psychosocial Re-Evaluation   Current issues with Current Depression;History of Depression;Current Psychotropic Meds    Comments Bonnie Ochoa feels like she is depressed since her mother died in 21. She has no other concernes since last follow up.    Expected Outcomes Short: Continue to exercise regularly to support mental health and notify staff of any changes. Long: maintain mental health and well being through teaching of rehab or prescribed medications independently.    Interventions Encouraged to attend Cardiac Rehabilitation for the exercise    Continue Psychosocial Services  Follow up required by staff             Vocational Rehabilitation: Provide vocational rehab assistance to qualifying candidates.   Vocational Rehab Evaluation & Intervention:  Vocational Rehab - 10/26/20 1053       Initial Vocational Rehab Evaluation & Intervention   Assessment shows need for Vocational Rehabilitation No             Education: Education Goals: Education classes will be provided on a variety of topics geared toward better understanding of heart health and risk factor modification. Participant will state understanding/return demonstration of topics presented as noted by education test scores.  Learning Barriers/Preferences:  Learning Barriers/Preferences - 10/26/20 1053       Learning Barriers/Preferences   Learning Barriers None    Learning Preferences None             General Cardiac Education Topics:  AED/CPR: - Group verbal and written instruction with the use of models to demonstrate the basic use of the AED with the basic ABC's of  resuscitation.   Anatomy and Cardiac Procedures: - Group verbal and visual presentation and models provide information about basic cardiac anatomy and function. Reviews the testing methods done to diagnose heart disease and the outcomes of the test results. Describes the treatment choices: Medical Management, Angioplasty, or Coronary Bypass Surgery for treating various heart conditions including Myocardial Infarction, Angina, Valve Disease, and Cardiac Arrhythmias.  Written material given at graduation. Flowsheet Row Cardiac Rehab from 01/11/2021 in [facility] Cardiac and Pulmonary Rehab  Education need identified 11/03/20       Medication Safety: - Group verbal and visual instruction to review commonly prescribed medications for heart and lung disease. Reviews the medication, class of the drug, and side effects. Includes the steps to properly store meds and maintain the prescription regimen.  Written material given at graduation. Flowsheet Row Cardiac Rehab from 01/11/2021 in [facility] Cardiac and Pulmonary  Rehab  Date 12/28/20  Educator SB  Instruction Review Code 1- Verbalizes Understanding       Intimacy: - Group verbal instruction through game format to discuss how heart and lung disease can affect sexual intimacy. Written material given at graduation..   Know Your Numbers and Heart Failure: - Group verbal and visual instruction to discuss disease risk factors for cardiac and pulmonary disease and treatment options.  Reviews associated critical values for Overweight/Obesity, Hypertension, Cholesterol, and Diabetes.  Discusses basics of heart failure: signs/symptoms and treatments.  Introduces Heart Failure Zone chart for action plan for heart failure.  Written material given at graduation. Flowsheet Row Cardiac Rehab from 01/11/2021 in [facility] Cardiac and Pulmonary Rehab  Date 01/04/21  Educator [facility]  Instruction Review Code 1- Verbalizes Understanding       Infection Prevention: - Provides verbal  and written material to individual with discussion of infection control including proper hand washing and proper equipment cleaning during exercise session. Flowsheet Row Cardiac Rehab from 01/11/2021 in [facility] Cardiac and Pulmonary Rehab  Date 11/03/20  Educator [facility]  Instruction Review Code 1- Verbalizes Understanding       Falls Prevention: - Provides verbal and written material to individual with discussion of falls prevention and safety. Flowsheet Row Cardiac Rehab from 01/11/2021 in [facility] Cardiac and Pulmonary Rehab  Date 10/26/20  Educator SB  Instruction Review Code 1- Verbalizes Understanding       Other: -Provides group and verbal instruction on various topics (see comments)   Knowledge Questionnaire Score:  Knowledge Questionnaire Score - 11/03/20 1317       Knowledge Questionnaire Score   Pre Score 20/26 Education Focus: Nutrition, exercise, angina, depression             Core Components/Risk Factors/Patient Goals at Admission:  Personal Goals and Risk Factors at Admission - 11/03/20 1317       Core Components/Risk Factors/Patient Goals on Admission    Weight Management Yes;Weight Maintenance    Intervention Weight Management: Develop a combined nutrition and exercise program designed to reach desired caloric intake, while maintaining appropriate intake of nutrient and fiber, sodium and fats, and appropriate energy expenditure required for the weight goal.;Weight Management: Provide education and appropriate resources to help participant work on and attain dietary goals.;Obesity: Provide education and appropriate resources to help participant work on and attain dietary goals.    Admit Weight 178 lb 3.2 oz (80.8 kg)    Goal Weight: Short Term 173 lb (78.5 kg)    Goal Weight: Long Term 165 lb (74.8 kg)    Expected Outcomes Short Term: Continue to assess and modify interventions until short term weight is achieved;Long Term: Adherence to nutrition and physical  activity/exercise program aimed toward attainment of established weight goal;Weight Loss: Understanding of general recommendations for a balanced deficit meal plan, which promotes 1-2 lb weight loss per week and includes a negative energy balance of [phone] kcal/d;Understanding recommendations for meals to include 15-35% energy as protein, 25-35% energy from fat, 35-60% energy from carbohydrates, less than 262m of dietary cholesterol, 20-35 gm of total fiber daily;Understanding of distribution of calorie intake throughout the day with the consumption of 4-5 meals/snacks    Diabetes Yes    Intervention Provide education about signs/symptoms and action to take for hypo/hyperglycemia.;Provide education about proper nutrition, including hydration, and aerobic/resistive exercise prescription along with prescribed medications to achieve blood glucose in normal ranges: Fasting glucose 65-99 mg/dL    Expected Outcomes Short Term: Participant verbalizes understanding of the  signs/symptoms and immediate care of hyper/hypoglycemia, proper foot care and importance of medication, aerobic/resistive exercise and nutrition plan for blood glucose control.;Long Term: Attainment of HbA1C < 7%.    Heart Failure Yes    Intervention Provide a combined exercise and nutrition program that is supplemented with education, support and counseling about heart failure. Directed toward relieving symptoms such as shortness of breath, decreased exercise tolerance, and extremity edema.    Expected Outcomes Improve functional capacity of life;Short term: Attendance in program 2-3 days a week with increased exercise capacity. Reported lower sodium intake. Reported increased fruit and vegetable intake. Reports medication compliance.;Short term: Daily weights obtained and reported for increase. Utilizing diuretic protocols set by physician.;Long term: Adoption of self-care skills and reduction of barriers for early signs and symptoms recognition  and intervention leading to self-care maintenance.    Hypertension Yes    Intervention Provide education on lifestyle modifcations including regular physical activity/exercise, weight management, moderate sodium restriction and increased consumption of fresh fruit, vegetables, and low fat dairy, alcohol moderation, and smoking cessation.;Monitor prescription use compliance.    Expected Outcomes Short Term: Continued assessment and intervention until BP is < 140/71m HG in hypertensive participants. < 130/858mHG in hypertensive participants with diabetes, heart failure or chronic kidney disease.;Long Term: Maintenance of blood pressure at goal levels.    Lipids Yes    Intervention Provide education and support for participant on nutrition & aerobic/resistive exercise along with prescribed medications to achieve LDL <7037mHDL >15m68m  Expected Outcomes Short Term: Participant states understanding of desired cholesterol values and is compliant with medications prescribed. Participant is following exercise prescription and nutrition guidelines.;Long Term: Cholesterol controlled with medications as prescribed, with individualized exercise RX and with personalized nutrition plan. Value goals: LDL < 70mg61mL > 40 mg.             Education:Diabetes - Individual verbal and written instruction to review signs/symptoms of diabetes, desired ranges of glucose level fasting, after meals and with exercise. Acknowledge that pre and post exercise glucose checks will be done for 3 sessions at entry of program.   Core Components/Risk Factors/Patient Goals Review:   Goals and Risk Factor Review     Row Name 12/21/20 1133 01/13/21 1001           Core Components/Risk Factors/Patient Goals Review   Personal Goals Review Weight Management/Obesity;Heart Failure;Hypertension;Diabetes Weight Management/Obesity;Diabetes      Review Weight has been stable, she does weigh her self at home and is aware to report  and look for any sudden weight gain. At this time, she denies any symptoms for heart failure. We did discuss a hand out for heart failure, symptoms to look out for and has a take home handout to reference to. She ran out of test strips but normally checks her sugars twice/day. Her doctor wants it around 100 and reports her sugars around 90-low 100s. Her A1C trend has decreased and applauded her to continue lowering that number. I did stress importance of checking sugars, especially if ever symptomatic, and encouraged her to get new test strips as soon as possible. She does not check her BP and was also encouraged to get a cuff for home use. --      Expected Outcomes Short: Obtain more test strips and home BP cuff Long: Continue to manage lifestyle risk factors --               Core Components/Risk Factors/Patient Goals at Discharge (Final Review):  Goals and Risk Factor Review - 01/13/21 1001       Core Components/Risk Factors/Patient Goals Review   Personal Goals Review Weight Management/Obesity;Diabetes             ITP Comments:  ITP Comments     Row Name 10/26/20 1110 11/03/20 1310 11/04/20 0830 11/15/20 1610 11/30/20 0622   ITP Comments Virtual orientation call completed today. shehas an appointment on Date: 11/03/2020 for EP eval and gym Orientation.  Documentation of diagnosis can be found in [facility]  Date: 06/29/2020. Completed 6MWT and gym orientation. Initial ITP created and sent for review to Dr. Emily Filbert, Medical Director. Bonnie Ochoa called to let us know that she has started a new job and will be working from [employer] each day.  Her current time slot of 945 will not work with her new scheduled.  She would like to wait two weeks before starting rehab to get used to her job and to see which time would work best for her.  We will touch base again in two weeks. Waiting to hear back from new job info. 30 Day review completed. Medical Director ITP review done, changes made as directed, and  signed approval by Medical Director.No visits in April    Row Name 12/14/20 1102 12/28/20 0746 01/25/21 0851       ITP Comments First full day of exercise!  Patient was oriented to gym and equipment including functions, settings, policies, and procedures.  Patient's individual exercise prescription and treatment plan were reviewed.  All starting workloads were established based on the results of the 6 minute walk test done at initial orientation visit.  The plan for exercise progression was also introduced and progression will be customized based on patient's performance and goals. 30 Day review completed. Medical Director ITP review done, changes made as directed, and signed approval by Medical Director.  New to program 30 Day review completed. Medical Director ITP review done, changes made as directed, and signed approval by Medical Director.              Comments:

## 2021-01-26 ENCOUNTER — Other Ambulatory Visit: Payer: Self-pay | Admitting: Family Medicine

## 2021-01-26 ENCOUNTER — Other Ambulatory Visit: Payer: Self-pay | Admitting: Psychiatry

## 2021-01-26 DIAGNOSIS — F3131 Bipolar disorder, current episode depressed, mild: Secondary | ICD-10-CM

## 2021-01-26 NOTE — Telephone Encounter (Signed)
Scheduled 9/1.

## 2021-01-26 NOTE — Telephone Encounter (Signed)
Patient will not be provided future refills

## 2021-01-26 NOTE — Telephone Encounter (Signed)
Requested medication (s) are due for refill today: yes   Requested medication (s) are on the active medication list: yes   Last refill: 11/08/2020  Future visit scheduled: no   Notes to clinic: this refill cannot be delegated    Requested Prescriptions  Pending Prescriptions Disp Refills   cyclobenzaprine (FLEXERIL) 5 MG tablet [Pharmacy Med Name: CYCLOBENZAPRINE 5 MG TABLET] 60 tablet 1    Sig: TAKE 1 TABLET BY MOUTH THREE TIMES A DAY AS NEEDED FOR MUSCLE SPASMS      Not Delegated - Analgesics:  Muscle Relaxants Failed - 01/26/2021  7:45 AM      Failed - This refill cannot be delegated      Passed - Valid encounter within last 6 months    Recent Outpatient Visits           2 weeks ago Chest pain, unspecified type   [facility], Megan P, DO   6 months ago Type 2 diabetes mellitus with stage 3a chronic kidney disease, with long-term current use of insulin (HCC)   [facility] [place], Megan P, DO   8 months ago Type 2 diabetes mellitus with stage 3a chronic kidney disease, with long-term current use of insulin (HCC)   [facility] [place], Megan P, DO   9 months ago Type 2 diabetes mellitus with stage 3a chronic kidney disease, with long-term current use of insulin (HCC)   [facility] [place], Megan P, DO   10 months ago Procedure not carried out   [facility], [place], DO       Future Appointments             In 2 months Laural Benes, Oralia Rud, DO [facility], [facility]   In 2 months Gollan, Tollie Pizza, MD [facility]   In 7 months  [facility], [facility]

## 2021-01-27 ENCOUNTER — Ambulatory Visit: Payer: Medicare Other

## 2021-01-27 ENCOUNTER — Encounter: Payer: Medicare Other | Admitting: *Deleted

## 2021-01-27 ENCOUNTER — Other Ambulatory Visit: Payer: Self-pay

## 2021-01-27 DIAGNOSIS — I5022 Chronic systolic (congestive) heart failure: Secondary | ICD-10-CM

## 2021-01-27 DIAGNOSIS — Z87891 Personal history of nicotine dependence: Secondary | ICD-10-CM | POA: Diagnosis not present

## 2021-01-27 NOTE — Progress Notes (Signed)
Daily Session Note  Patient Details  Name: Bonnie Ochoa MRN: [id number] Date of Birth: 06-20-1966 Referring Provider:   Flowsheet Row Cardiac Rehab from 11/03/2020 in [facility] Cardiac and Pulmonary Rehab  Referring Provider Glori Bickers MD  [facility] Cardiologist: Dr. Ida Rogue       Encounter Date: 01/27/2021  Check In:  Session Check In - 01/27/21 0952       Check-In   Supervising physician immediately available to respond to emergencies See telemetry face sheet for immediately available ER MD    Location [facility]-Cardiac & Pulmonary Rehab    Staff Present Justin Mend RCP,RRT,BSRT;Shailee Foots Sherryll Burger, RN BSN;Melissa Caiola RDN, LDN    Virtual Visit No    Medication changes reported     No    Fall or balance concerns reported    No    Warm-up and Cool-down Performed on first and last piece of equipment    Resistance Training Performed Yes    VAD Patient? No    PAD/SET Patient? No      Pain Assessment   Currently in Pain? No/denies                Social History   Tobacco Use  Smoking Status Former   Pack years: 0.00   Types: Cigarettes   Quit date: 1985   Years since quitting: 37.4  Smokeless Tobacco Never  Tobacco Comments   quit over  20 years ago     Goals Met:  Independence with exercise equipment Exercise tolerated well No report of cardiac concerns or symptoms Strength training completed today  Goals Unmet:  Not Applicable  Comments: Pt able to follow exercise prescription today without complaint.  Will continue to monitor for progression.    Dr. Emily Filbert is Medical Director for [place].  Dr. Ottie Glazier is Medical Director for [facility] Pulmonary Rehabilitation.

## 2021-01-30 ENCOUNTER — Encounter: Payer: Medicare Other | Admitting: *Deleted

## 2021-01-30 ENCOUNTER — Other Ambulatory Visit: Payer: Self-pay

## 2021-01-30 ENCOUNTER — Ambulatory Visit: Payer: Medicare Other

## 2021-01-30 DIAGNOSIS — I5022 Chronic systolic (congestive) heart failure: Secondary | ICD-10-CM

## 2021-01-30 DIAGNOSIS — Z87891 Personal history of nicotine dependence: Secondary | ICD-10-CM | POA: Diagnosis not present

## 2021-01-30 NOTE — Progress Notes (Signed)
Daily Session Note  Patient Details  Name: Bonnie Ochoa MRN: [id number] Date of Birth: 05/16/1966 Referring Provider:   Flowsheet Row Cardiac Rehab from 11/03/2020 in [facility] Cardiac and Pulmonary Rehab  Referring Provider Bensimhon, Daniel MD  [Primary Cardiologist: Dr. Timothy Gollan]       Encounter Date: 01/30/2021  Check In:  Session Check In - 01/30/21 1026       Check-In   Supervising physician immediately available to respond to emergencies See telemetry face sheet for immediately available ER MD    Location [facility]-Cardiac & Pulmonary Rehab    Staff Present  , RN BSN;Joseph Hood RCP,RRT,BSRT;Kelly Hayes, BS, ACSM CEP, Exercise Physiologist    Virtual Visit No    Medication changes reported     No    Fall or balance concerns reported    No    Warm-up and Cool-down Performed on first and last piece of equipment    Resistance Training Performed Yes    VAD Patient? No    PAD/SET Patient? No      Pain Assessment   Currently in Pain? No/denies                Social History   Tobacco Use  Smoking Status Former   Pack years: 0.00   Types: Cigarettes   Quit date: 1985   Years since quitting: 37.4  Smokeless Tobacco Never  Tobacco Comments   quit over  20 years ago     Goals Met:  Independence with exercise equipment Exercise tolerated well No report of cardiac concerns or symptoms Strength training completed today  Goals Unmet:  Not Applicable  Comments: Pt able to follow exercise prescription today without complaint.  Will continue to monitor for progression.    Dr. Mark Miller is Medical Director for [facility].  Dr. Fuad Aleskerov is Medical Director for [facility]monary [facility]. 

## 2021-01-31 ENCOUNTER — Other Ambulatory Visit: Payer: Medicare Other

## 2021-01-31 ENCOUNTER — Other Ambulatory Visit: Payer: Self-pay

## 2021-01-31 DIAGNOSIS — N289 Disorder of kidney and ureter, unspecified: Secondary | ICD-10-CM | POA: Diagnosis not present

## 2021-02-01 ENCOUNTER — Ambulatory Visit: Payer: Medicare Other

## 2021-02-01 DIAGNOSIS — I5022 Chronic systolic (congestive) heart failure: Secondary | ICD-10-CM

## 2021-02-01 DIAGNOSIS — Z87891 Personal history of nicotine dependence: Secondary | ICD-10-CM | POA: Diagnosis not present

## 2021-02-01 LAB — BASIC METABOLIC PANEL
BUN/Creatinine Ratio: 13 (ref 9–23)
BUN: 18 mg/dL (ref 6–24)
CO2: 23 mmol/L (ref 20–29)
Calcium: 9.2 mg/dL (ref 8.7–10.2)
Chloride: 101 mmol/L (ref 96–106)
Creatinine, Ser: 1.37 mg/dL — ABNORMAL HIGH (ref 0.57–1.00)
Glucose: 310 mg/dL — ABNORMAL HIGH (ref 65–99)
Potassium: 4 mmol/L (ref 3.5–5.2)
Sodium: 140 mmol/L (ref 134–144)
eGFR: 45 mL/min/{1.73_m2} — ABNORMAL LOW (ref >59)

## 2021-02-01 NOTE — Progress Notes (Signed)
Daily Session Note  Patient Details  Name: Bonnie Ochoa MRN: [id number] Date of Birth: 15-Jun-1966 Referring Provider:   Flowsheet Row Cardiac Rehab from 11/03/2020 in [facility] Cardiac and Pulmonary Rehab  Referring Provider Glori Bickers MD  [facility] Cardiologist: Dr. Ida Rogue       Encounter Date: 02/01/2021  Check In:  Session Check In - 02/01/21 0955       Check-In   Supervising physician immediately available to respond to emergencies See telemetry face sheet for immediately available ER MD    Location [facility]-Cardiac & Pulmonary Rehab    Staff Present Birdie Sons, MPA, Elveria Rising, BA, ACSM CEP, Exercise Physiologist;Joseph Tessie Fass RCP,RRT,BSRT    Virtual Visit No    Medication changes reported     No    Fall or balance concerns reported    No    Warm-up and Cool-down Performed on first and last piece of equipment    Resistance Training Performed Yes    VAD Patient? No    PAD/SET Patient? No      Pain Assessment   Currently in Pain? No/denies                Social History   Tobacco Use  Smoking Status Former   Pack years: 0.00   Types: Cigarettes   Quit date: 1985   Years since quitting: 37.4  Smokeless Tobacco Never  Tobacco Comments   quit over  20 years ago     Goals Met:  Independence with exercise equipment Exercise tolerated well No report of cardiac concerns or symptoms Strength training completed today  Goals Unmet:  Not Applicable  Comments: Pt able to follow exercise prescription today without complaint.  Will continue to monitor for progression.    Dr. Emily Filbert is Medical Director for [place].  Dr. Ottie Glazier is Medical Director for [facility] Pulmonary Rehabilitation.

## 2021-02-03 ENCOUNTER — Ambulatory Visit: Payer: Medicare Other

## 2021-02-06 ENCOUNTER — Other Ambulatory Visit: Payer: Self-pay

## 2021-02-06 ENCOUNTER — Encounter: Payer: Medicare Other | Admitting: *Deleted

## 2021-02-06 DIAGNOSIS — I5022 Chronic systolic (congestive) heart failure: Secondary | ICD-10-CM

## 2021-02-06 DIAGNOSIS — Z87891 Personal history of nicotine dependence: Secondary | ICD-10-CM | POA: Diagnosis not present

## 2021-02-06 NOTE — Progress Notes (Signed)
Daily Session Note  Patient Details  Name: Dharma Pare MRN: [id number] Date of Birth: 09/06/1965 Referring Provider:   Flowsheet Row Cardiac Rehab from 11/03/2020 in [facility] Cardiac and Pulmonary Rehab  Referring Provider Glori Bickers MD  [facility] Cardiologist: Dr. Ida Rogue       Encounter Date: 02/06/2021  Check In:  Session Check In - 02/06/21 1033       Check-In   Supervising physician immediately available to respond to emergencies See telemetry face sheet for immediately available ER MD    Location [facility]-Cardiac & Pulmonary Rehab    Staff Present Renita Papa, RN BSN;Joseph Hood RCP,RRT,BSRT;Amanda Oletta Darter, [place], ACSM CEP, Exercise Physiologist    Virtual Visit No    Medication changes reported     No    Fall or balance concerns reported    No    Warm-up and Cool-down Performed on first and last piece of equipment    Resistance Training Performed Yes    VAD Patient? No    PAD/SET Patient? No      Pain Assessment   Currently in Pain? No/denies                Social History   Tobacco Use  Smoking Status Former   Pack years: 0.00   Types: Cigarettes   Quit date: 1985   Years since quitting: 37.5  Smokeless Tobacco Never  Tobacco Comments   quit over  20 years ago     Goals Met:  Independence with exercise equipment Exercise tolerated well No report of cardiac concerns or symptoms Strength training completed today  Goals Unmet:  Not Applicable  Comments: Pt able to follow exercise prescription today without complaint.  Will continue to monitor for progression.    Dr. Emily Filbert is Medical Director for [place].  Dr. Ottie Glazier is Medical Director for [facility] Pulmonary Rehabilitation.

## 2021-02-08 ENCOUNTER — Other Ambulatory Visit: Payer: Self-pay

## 2021-02-08 DIAGNOSIS — I5022 Chronic systolic (congestive) heart failure: Secondary | ICD-10-CM | POA: Diagnosis not present

## 2021-02-08 DIAGNOSIS — Z87891 Personal history of nicotine dependence: Secondary | ICD-10-CM | POA: Diagnosis not present

## 2021-02-08 NOTE — Progress Notes (Signed)
Daily Session Note  Patient Details  Name: Bonnie Ochoa MRN: [id number] Date of Birth: September 23, 1965 Referring Provider:   Flowsheet Row Cardiac Rehab from 11/03/2020 in [facility] Cardiac and Pulmonary Rehab  Referring Provider Glori Bickers MD  [facility] Cardiologist: Dr. Ida Rogue       Encounter Date: 02/08/2021  Check In:  Session Check In - 02/08/21 1044       Check-In   Supervising physician immediately available to respond to emergencies See telemetry face sheet for immediately available ER MD    Location [facility]-Cardiac & Pulmonary Rehab    Staff Present Birdie Sons, MPA, Nino Glow, MS, ASCM CEP, Exercise Physiologist;Joseph Tessie Fass, RCP,RRT,BSRT;Kristen Coble, RN,BC,MSN    Virtual Visit No    Medication changes reported     No    Fall or balance concerns reported    No    Warm-up and Cool-down Performed on first and last piece of equipment    Resistance Training Performed Yes    VAD Patient? No    PAD/SET Patient? No      Pain Assessment   Currently in Pain? No/denies                Social History   Tobacco Use  Smoking Status Former   Pack years: 0.00   Types: Cigarettes   Quit date: 1985   Years since quitting: 37.5  Smokeless Tobacco Never  Tobacco Comments   quit over  20 years ago     Goals Met:  Independence with exercise equipment Exercise tolerated well No report of cardiac concerns or symptoms Strength training completed today  Goals Unmet:  Not Applicable  Comments: Pt able to follow exercise prescription today without complaint.  Will continue to monitor for progression.    Dr. Emily Filbert is Medical Director for [place].  Dr. Ottie Glazier is Medical Director for [facility] Pulmonary Rehabilitation.

## 2021-02-10 ENCOUNTER — Other Ambulatory Visit: Payer: Self-pay

## 2021-02-10 ENCOUNTER — Encounter: Payer: Medicare Other | Attending: Internal Medicine | Admitting: *Deleted

## 2021-02-10 DIAGNOSIS — I5022 Chronic systolic (congestive) heart failure: Secondary | ICD-10-CM | POA: Insufficient documentation

## 2021-02-10 NOTE — Progress Notes (Signed)
Daily Session Note  Patient Details  Name: Bonnie Ochoa MRN: [id number] Date of Birth: [birth year] Referring Provider:   Flowsheet Row Cardiac Rehab from 11/03/2020 in [facility] Cardiac and Pulmonary Rehab  Referring Provider Glori Bickers MD  [facility] Cardiologist: Dr. Ida Rogue       Encounter Date: 02/10/2021  Check In:  Session Check In - 02/10/21 0951       Check-In   Supervising physician immediately available to respond to emergencies See telemetry face sheet for immediately available ER MD    Location [facility]-Cardiac & Pulmonary Rehab    Staff Present Renita Papa, RN BSN;Joseph [place], RCP,RRT,BSRT;Jessica [place], [place], RCEP, CCRP, CCET    Virtual Visit No    Medication changes reported     No    Fall or balance concerns reported    No    Warm-up and Cool-down Performed on first and last piece of equipment    Resistance Training Performed Yes    VAD Patient? No    PAD/SET Patient? No      Pain Assessment   Currently in Pain? No/denies                Social History   Tobacco Use  Smoking Status Former   Pack years: 0.00   Types: Cigarettes   Quit date: 1985   Years since quitting: 37.5  Smokeless Tobacco Never  Tobacco Comments   quit over  20 years ago     Goals Met:  Independence with exercise equipment Exercise tolerated well No report of cardiac concerns or symptoms Strength training completed today  Goals Unmet:  Not Applicable  Comments: Pt able to follow exercise prescription today without complaint.  Will continue to monitor for progression.    Dr. Emily Filbert is Medical Director for [place].  Dr. Ottie Glazier is Medical Director for [facility] Pulmonary Rehabilitation.

## 2021-02-14 ENCOUNTER — Other Ambulatory Visit: Payer: Self-pay | Admitting: Psychiatry

## 2021-02-14 DIAGNOSIS — F5105 Insomnia due to other mental disorder: Secondary | ICD-10-CM

## 2021-02-15 ENCOUNTER — Other Ambulatory Visit: Payer: Self-pay

## 2021-02-15 ENCOUNTER — Other Ambulatory Visit: Payer: Self-pay | Admitting: Psychiatry

## 2021-02-15 ENCOUNTER — Other Ambulatory Visit: Payer: Self-pay | Admitting: Family Medicine

## 2021-02-15 VITALS — Ht 65.0 in | Wt 189.5 lb

## 2021-02-15 DIAGNOSIS — I5022 Chronic systolic (congestive) heart failure: Secondary | ICD-10-CM | POA: Diagnosis not present

## 2021-02-15 NOTE — Telephone Encounter (Signed)
Received a refill request of Ambien from the pharmacy. Will decline at this time given she has not seen Dr. Elna Breslow since March. Please contact her to make a follow up with Dr. Elna Breslow. I will CC this note to her so that she is aware of this.

## 2021-02-15 NOTE — Progress Notes (Signed)
Daily Session Note  Patient Details  Name: Bonnie Ochoa MRN: [id number] Date of Birth: 1966/04/14 Referring Provider:   Flowsheet Row Cardiac Rehab from 11/03/2020 in [facility] Cardiac and Pulmonary Rehab  Referring Provider Glori Bickers MD  [facility] Cardiologist: Dr. Ida Rogue       Encounter Date: 02/15/2021  Check In:  Session Check In - 02/15/21 0957       Check-In   Supervising physician immediately available to respond to emergencies See telemetry face sheet for immediately available ER MD    Location [facility]-Cardiac & Pulmonary Rehab    Staff Present Birdie Sons, MPA, RN;Laureen Owens Shark, BS, RRT, CPFT;Joseph Tessie Fass, [place]    Virtual Visit No    Medication changes reported     No    Fall or balance concerns reported    No    Warm-up and Cool-down Performed on first and last piece of equipment    Resistance Training Performed Yes    VAD Patient? No    PAD/SET Patient? No      Pain Assessment   Currently in Pain? No/denies                Social History   Tobacco Use  Smoking Status Former   Pack years: 0.00   Types: Cigarettes   Quit date: 1985   Years since quitting: 37.5  Smokeless Tobacco Never  Tobacco Comments   quit over  20 years ago     Goals Met:  Independence with exercise equipment Exercise tolerated well No report of cardiac concerns or symptoms Strength training completed today  Goals Unmet:  Not Applicable  Comments: Pt able to follow exercise prescription today without complaint.  Will continue to monitor for progression.   [place] Name 11/03/20 1311 02/15/21 1149       6 Minute Walk   Phase Initial Discharge    Distance 1095 feet 1305 feet    Distance % Change -- 19.2 %    Distance Feet Change -- 210 ft    Walk Time 6 minutes 6 minutes    # of Rest Breaks 0 0    MPH 2.07 2.47    METS 3.1 3.39    RPE 13 12    VO2 Peak 10.86 11.88    Symptoms Yes (comment) No    Comments R thigh pain 4/10 --     Resting HR 59 bpm 88 bpm    Resting BP 142/84 122/76    Resting Oxygen Saturation  97 % 97 %    Exercise Oxygen Saturation  during 6 min walk 99 % 98 %    Max Ex. HR 81 bpm 106 bpm    Max Ex. BP 154/86 128/72    2 Minute Post BP 138/80 --              Dr. Emily Filbert is Medical Director for [place].  Dr. Ottie Glazier is Medical Director for [facility] Pulmonary Rehabilitation.

## 2021-02-17 ENCOUNTER — Encounter: Payer: Medicare Other | Admitting: *Deleted

## 2021-02-17 ENCOUNTER — Other Ambulatory Visit: Payer: Self-pay

## 2021-02-17 DIAGNOSIS — I5022 Chronic systolic (congestive) heart failure: Secondary | ICD-10-CM

## 2021-02-17 NOTE — Progress Notes (Signed)
Daily Session Note  Patient Details  Name: Bonnie Ochoa MRN: [id number] Date of Birth: 04-21-1966 Referring Provider:   Flowsheet Row Cardiac Rehab from 11/03/2020 in [facility] Cardiac and Pulmonary Rehab  Referring Provider Glori Bickers MD  [facility] Cardiologist: Dr. Ida Rogue       Encounter Date: 02/17/2021  Check In:  Session Check In - 02/17/21 1000       Check-In   Supervising physician immediately available to respond to emergencies See telemetry face sheet for immediately available ER MD    Location [facility]-Cardiac & Pulmonary Rehab    Staff Present Renita Papa, RN BSN;Melissa Tilford Pillar, RDN, LDN;Jessica Luan Pulling, MA, RCEP, CCRP, CCET    Virtual Visit No    Medication changes reported     No    Fall or balance concerns reported    No    Warm-up and Cool-down Performed on first and last piece of equipment    Resistance Training Performed Yes    VAD Patient? No    PAD/SET Patient? No      Pain Assessment   Currently in Pain? No/denies                Social History   Tobacco Use  Smoking Status Former   Pack years: 0.00   Types: Cigarettes   Quit date: 1985   Years since quitting: 37.5  Smokeless Tobacco Never  Tobacco Comments   quit over  20 years ago     Goals Met:  Independence with exercise equipment Exercise tolerated well No report of cardiac concerns or symptoms Strength training completed today  Goals Unmet:  Not Applicable  Comments: Pt able to follow exercise prescription today without complaint.  Will continue to monitor for progression.    Dr. Emily Filbert is Medical Director for [place].  Dr. Ottie Glazier is Medical Director for [facility] Pulmonary Rehabilitation.

## 2021-02-20 ENCOUNTER — Other Ambulatory Visit: Payer: Self-pay

## 2021-02-20 ENCOUNTER — Encounter: Payer: Medicare Other | Admitting: *Deleted

## 2021-02-20 DIAGNOSIS — I5022 Chronic systolic (congestive) heart failure: Secondary | ICD-10-CM | POA: Diagnosis not present

## 2021-02-20 NOTE — Progress Notes (Signed)
Daily Session Note  Patient Details  Name: Bonnie Ochoa MRN: [id number] Date of Birth: [birth year] Referring Provider:   Flowsheet Row Cardiac Rehab from 11/03/2020 in [facility] Cardiac and Pulmonary Rehab  Referring Provider Glori Bickers MD  [facility] Cardiologist: Dr. Ida Rogue       Encounter Date: 02/20/2021  Check In:  Session Check In - 02/20/21 1102       Check-In   Supervising physician immediately available to respond to emergencies See telemetry face sheet for immediately available ER MD    Location [facility]-Cardiac & Pulmonary Rehab    Staff Present Heath Lark, RN, BSN, CCRP;Kristen Coble, RN,BC,MSN;Kelly Amedeo Plenty, BS, ACSM CEP, Exercise Physiologist    Virtual Visit No    Medication changes reported     No    Fall or balance concerns reported    No    Warm-up and Cool-down Performed on first and last piece of equipment    Resistance Training Performed Yes    VAD Patient? No    PAD/SET Patient? No      Pain Assessment   Currently in Pain? No/denies                Social History   Tobacco Use  Smoking Status Former   Pack years: 0.00   Types: Cigarettes   Quit date: 1985   Years since quitting: 37.5  Smokeless Tobacco Never  Tobacco Comments   quit over  20 years ago     Goals Met:  Independence with exercise equipment Exercise tolerated well No report of cardiac concerns or symptoms  Goals Unmet:  Not Applicable  Comments: Pt able to follow exercise prescription today without complaint.  Will continue to monitor for progression.    Dr. Emily Filbert is Medical Director for [place].  Dr. Ottie Glazier is Medical Director for [facility] Pulmonary Rehabilitation.

## 2021-02-22 ENCOUNTER — Encounter: Payer: Self-pay | Admitting: *Deleted

## 2021-02-22 DIAGNOSIS — I5022 Chronic systolic (congestive) heart failure: Secondary | ICD-10-CM

## 2021-02-22 NOTE — Progress Notes (Signed)
Cardiac Individual Treatment Plan  Patient Details  Name: Bonnie Ochoa MRN: [id number] Date of Birth: April 19, 1966 Referring Provider:   Flowsheet Row Cardiac Rehab from 11/03/2020 in [facility] Cardiac and Pulmonary Rehab  Referring Provider Glori Bickers MD  [facility] Cardiologist: Dr. Ida Rogue       Initial Encounter Date:  Flowsheet Row Cardiac Rehab from 11/03/2020 in [facility] Cardiac and Pulmonary Rehab  Date 11/03/20       Visit Diagnosis: Chronic systolic heart failure ([place])  Patient's Home Medications on Admission:  Current Outpatient Medications:    blood glucose meter kit and supplies KIT, Dispense based on patient and insurance preference. Use up to four times daily as directed. (FOR ICD-9 250.00, 250.01)., Disp: 1 each, Rfl: 0   carvedilol (COREG) 12.5 MG tablet, Take 1 tablet (12.5 mg total) by mouth 2 (two) times daily with a meal., Disp: 180 tablet, Rfl: 3   cyclobenzaprine (FLEXERIL) 5 MG tablet, TAKE 1 TABLET BY MOUTH THREE TIMES A DAY AS NEEDED FOR MUSCLE SPASMS, Disp: 60 tablet, Rfl: 1   digoxin (LANOXIN) 0.125 MG tablet, TAKE 1 TABLET BY MOUTH DAILY, Disp: 90 tablet, Rfl: 3   famotidine (PEPCID) 40 MG tablet, TAKE 1 TABLET BY MOUTH EVERY DAY IN THE EVENING, Disp: 90 tablet, Rfl: 0   furosemide (LASIX) 20 MG tablet, Take 1 tablet (20 mg total) by mouth as directed. Take 70m (1 tablet) Monday, Wednesday, Friday., Disp: 40 tablet, Rfl: 3   insulin glargine (LANTUS SOLOSTAR) 100 UNIT/ML Solostar Pen, Inject 35 Units into the skin daily at 10 pm., Disp: 15 mL, Rfl: 3   Insulin Pen Needle 32G X 6 MM MISC, 1 each by Does not apply route daily., Disp: 100 each, Rfl: 12   JARDIANCE 25 MG TABS tablet, TAKE 1 TABLET BY MOUTH EVERY DAY, Disp: 90 tablet, Rfl: 1   lamoTRIgine (LAMICTAL) 150 MG tablet, TAKE 1 TABLET BY MOUTH EVERY DAY, Disp: 30 tablet, Rfl: 0   metoCLOPramide (REGLAN) 10 MG tablet, Take 10 mg by mouth 4 (four) times daily., Disp: , Rfl:    pantoprazole  (PROTONIX) 40 MG tablet, TAKE 1 TABLET BY MOUTH EVERY DAY, Disp: 90 tablet, Rfl: 1   PARoxetine (PAXIL) 30 MG tablet, Take 1 tablet (30 mg total) by mouth daily., Disp: 90 tablet, Rfl: 1   potassium chloride SA (KLOR-CON M20) 20 MEQ tablet, TAKE 2 TABLETS (40 MEQ TOTAL) BY MOUTH 2 (TWO) TIMES DAILY., Disp: 180 tablet, Rfl: 3   risperiDONE (RISPERDAL) 0.25 MG tablet, TAKE 1 TABLET (0.25 MG TOTAL) BY MOUTH EVERY MORNING., Disp: 90 tablet, Rfl: 1   rosuvastatin (CRESTOR) 40 MG tablet, Take 1 tablet (40 mg total) by mouth daily., Disp: 90 tablet, Rfl: 3   sacubitril-valsartan (ENTRESTO) 97-103 MG, Take 1 tablet by mouth 2 (two) times daily., Disp: 60 tablet, Rfl: 11   spironolactone (ALDACTONE) 25 MG tablet, Take 1 tablet (25 mg total) by mouth daily., Disp: 90 tablet, Rfl: 3   sucralfate (CARAFATE) 1 g tablet, TAKE 1 TABLET (1 G TOTAL) BY MOUTH 4 (FOUR) TIMES DAILY., Disp: 360 tablet, Rfl: 3   zolpidem (AMBIEN) 10 MG tablet, TAKE 1 TABLET BY MOUTH AT BEDTIME AS NEEDED FOR SLEEP., Disp: 21 tablet, Rfl: 0  Past Medical History: Past Medical History:  Diagnosis Date   Abdominal pain    a. 05/2018 HIDA scan wnl.   ADHD    AICD (automatic cardioverter/defibrillator) present    a. 11/2014 s/p SJM Fortify Assura, single lead AICD (ser# [id number].  Anemia    Arthritis    Asthma    Bipolar 1 disorder ([place])    Chest pain    a. Hx of cath in [place] - reportedly nl; b. 04/2018 MV: EF 22%, fixed dist ant septal, apical, and inferoapical defects - ? scar vs. attenuation. No ischemia.   CHF (congestive heart failure) (HCC)    Cirrhosis of liver ([place])    Coronary artery disease    Depression    Diabetes mellitus without complication ([place])    Diverticulitis    Dysrhythmia    Heart murmur    HFrEF (heart failure with reduced ejection fraction) ([place])    a. 06/2017 Echo: EF 20-25%, diff HK. Mildly dil LA; b. 05/2018 Echo: EF 25-30%, diff HK, Gr2 DD. Triv AI. Mod MR. Mildly reduced RV fxn. Mod-Sev TR. PASP  45-54mHg.   Hypertension    Hypothyroidism    IBS (irritable bowel syndrome)    Insomnia    Migraines    [place]IC[place]ischemic [place]    a. EF prev 25%; b. 11/2014 s/p SJM Fortify Assura, single lead AICD (ser# [id number]; c. 06/2017 Echo: EF 20-25%; d. 05/2018 Echo: EF 25-30%, diff HK, Gr2 DD; e. 05/2018 Echo: EF 25-30%; f. 05/2018 Cath: Nl cors. LVEDP 276mg, PCWP 3271m. PA 65/40 (52). CO/CI 3.04/1.56; c. 07/2018 CPX: Mod HF limitation.   OSA (obstructive sleep apnea)    CPAP is broken   PTSD (post-traumatic stress disorder)    Restless leg syndrome    Vertigo     Tobacco Use: Social History   Tobacco Use  Smoking Status Former   Pack years: 0.00   Types: Cigarettes   Quit date: 1985   Years since quitting: 37.5  Smokeless Tobacco Never  Tobacco Comments   quit over  20 years ago     Labs: Recent RevChemical engineer  Labs for ITP Cardiac and Pulmonary Rehab Latest Ref Rng & Units 09/28/2019 12/25/2019 04/06/2020 07/11/2020 01/10/2021   Cholestrol 100 - 199 mg/dL 214(H) - - 152 188   LDLCALC 0 - 99 mg/dL 121(H) - - 84 93   HDL >39 mg/dL 36(L) - - 39(L) 35(L)   Trlycerides 0 - 149 mg/dL 321(H) - - 168(H) 359(H)   Hemoglobin A1c <7.0 % 9.9(H) 10.1(H) 8.5(H) 6.8 8.7(H)        Exercise Target Goals: Exercise Program Goal: Individual exercise prescription set using results from initial 6 min walk test and THRR while considering  patient's activity barriers and safety.   Exercise Prescription Goal: Initial exercise prescription builds to 30-45 minutes a day of aerobic activity, 2-3 days per week.  Home exercise guidelines will be given to patient during program as part of exercise prescription that the participant will acknowledge.   Education: Aerobic Exercise: - Group verbal and visual presentation on the components of exercise prescription. Introduces F.I.T.T principle from ACSM for exercise prescriptions.  Reviews F.I.T.T. principles of aerobic exercise  including progression. Written material given at graduation. Flow[facility]  Education need identified 11/03/20  Date 02/01/21  Educator [facility] Review Code 1- Verbalizes Understanding       Education: Resistance Exercise: - Group verbal and visual presentation on the components of exercise prescription. Introduces F.I.T.T principle from ACSM for exercise prescriptions  Reviews F.I.T.T. principles of resistance exercise including progression. Written material given at graduation. Flowsheet [facility]  Date  02/08/21  Educator [place]  Instruction Review Code 1- Verbalizes Understanding        Education: Exercise & Equipment Safety: - Individual verbal instruction and demonstration of equipment use and safety with use of the equipment. Flowsheet Row Cardiac Rehab from 02/08/2021 in [facility] Cardiac and Pulmonary Rehab  Date 11/03/20  Educator [facility]  Instruction Review Code 1- Verbalizes Understanding       Education: Exercise Physiology & General Exercise Guidelines: - Group verbal and written instruction with models to review the exercise physiology of the cardiovascular system and associated critical values. Provides general exercise guidelines with specific guidelines to those with heart or lung disease.    Education: Flexibility, Balance, Mind/Body Relaxation: - Group verbal and visual presentation with interactive activity on the components of exercise prescription. Introduces F.I.T.T principle from ACSM for exercise prescriptions. Reviews F.I.T.T. principles of flexibility and balance exercise training including progression. Also discusses the mind body connection.  Reviews various relaxation techniques to help reduce and manage stress (i.e. Deep breathing, progressive muscle relaxation, and visualization). Balance handout provided to take home. Written material  given at graduation. Flowsheet Row Cardiac Rehab from 02/08/2021 in [facility] Cardiac and Pulmonary Rehab  Date 12/14/20  Educator [place]  Instruction Review Code 1- Verbalizes Understanding       Activity Barriers & Risk Stratification:  Activity Barriers & Cardiac Risk Stratification - 11/03/20 1312       Activity Barriers & Cardiac Risk Stratification   Activity Barriers Chest Pain/Angina;Back Problems;Muscular Weakness;Deconditioning;Balance Concerns    Cardiac Risk Stratification High             6 Minute Walk:  6 Minute Walk     Row Name 11/03/20 1311 02/15/21 1149       6 Minute Walk   Phase Initial Discharge    Distance 1095 feet 1305 feet    Distance % Change -- 19.2 %    Distance Feet Change -- 210 ft    Walk Time 6 minutes 6 minutes    # of Rest Breaks 0 0    MPH 2.07 2.47    METS 3.1 3.39    RPE 13 12    VO2 Peak 10.86 11.88    Symptoms Yes (comment) No    Comments R thigh pain 4/10 --    Resting HR 59 bpm 88 bpm    Resting BP 142/84 122/76    Resting Oxygen Saturation  97 % 97 %    Exercise Oxygen Saturation  during 6 min walk 99 % 98 %    Max Ex. HR 81 bpm 106 bpm    Max Ex. BP 154/86 128/72    2 Minute Post BP 138/80 --             Oxygen Initial Assessment:   Oxygen Re-Evaluation:   Oxygen Discharge (Final Oxygen Re-Evaluation):   Initial Exercise Prescription:  Initial Exercise Prescription - 11/03/20 1300       Date of Initial Exercise RX and Referring Provider   Date 11/03/20    Referring Provider Glori Bickers MD   Primary Cardiologist: Dr. Ida Rogue     Treadmill   MPH 2    Grade 1    Minutes 15    METs 2.81      Recumbant Bike   Level 2    RPM 50    Watts 28    Minutes 15    METs 2      Arm Ergometer   Level 1  Watts 25    RPM 25    Minutes 15    METs 2      REL-XR   Level 3    Speed 50    Minutes 15    METs 2      Prescription Details   Frequency (times per week) 3    Duration Progress to 30  minutes of continuous aerobic without signs/symptoms of physical distress      Intensity   THRR 40-80% of Max Heartrate 101-143    Ratings of Perceived Exertion 11-13    Perceived Dyspnea 0-4      Progression   Progression Continue to progress workloads to maintain intensity without signs/symptoms of physical distress.      Resistance Training   Training Prescription Yes    Weight 3 lb    Reps 10-15             Perform Capillary Blood Glucose checks as needed.  Exercise Prescription Changes:   Exercise Prescription Changes     Row Name 11/03/20 1300 12/21/20 1100 12/26/20 1100 01/10/21 1600 01/25/21 0700     Response to Exercise   Blood Pressure (Admit) 142/84 -- 122/72 114/70 118/62   Blood Pressure (Exercise) 154/86 -- 118/70 142/78 128/64   Blood Pressure (Exit) 138/80 -- 108/62 108/64 102/62   Heart Rate (Admit) 59 bpm -- 85 bpm 78 bpm 66 bpm   Heart Rate (Exercise) 81 bpm -- 88 bpm 105 bpm 75 bpm   Heart Rate (Exit) 70 bpm -- 85 bpm 85 bpm 75 bpm   Oxygen Saturation (Admit) 97 % -- -- -- --   Oxygen Saturation (Exercise) 99 % -- -- -- --   Rating of Perceived Exertion (Exercise) 13 -- 15 15 13    Symptoms r thigh pain 4/10 -- none none none   Comments walk test results -- -- -- --   Duration -- -- Progress to 30 minutes of  aerobic without signs/symptoms of physical distress Continue with 30 min of aerobic exercise without signs/symptoms of physical distress. Progress to 30 minutes of  aerobic without signs/symptoms of physical distress   Intensity -- -- THRR unchanged THRR unchanged THRR unchanged     Progression   Progression -- -- Continue to progress workloads to maintain intensity without signs/symptoms of physical distress. Continue to progress workloads to maintain intensity without signs/symptoms of physical distress. Continue to progress workloads to maintain intensity without signs/symptoms of physical distress.   Average METs -- -- 3 2.47 2.23      Resistance Training   Training Prescription -- -- Yes Yes Yes   Weight -- -- 3 lb 3 lb 3 lb   Reps -- -- 10-15 10-15 10-15     Interval Training   Interval Training -- -- No No No     NuStep   Level -- -- -- 4 --   Minutes -- -- -- 15 --   METs -- -- -- 1.4 --     Arm Ergometer   Level -- -- 1 1 1    Minutes -- -- 15 15 15    METs -- -- 2.55 1.6 2.6     Track   Laps -- -- 45 41 37   Minutes -- -- -- 15 15   METs -- -- 3.4 2.66 2.95     Home Exercise Plan   Plans to continue exercise at -- Home (comment)  walking Home (comment)  walking Home (comment)  walking Home (comment)  walking  Frequency -- Add 2 additional days to program exercise sessions. Add 2 additional days to program exercise sessions. Add 2 additional days to program exercise sessions. Add 2 additional days to program exercise sessions.   Initial Home Exercises Provided -- 12/21/20 12/21/20 12/21/20 12/21/20    Row Name 02/06/21 1400 02/20/21 1000           Response to Exercise   Blood Pressure (Admit) 112/74 126/64      Blood Pressure (Exit) 102/58 132/70      Heart Rate (Admit) 90 bpm 85 bpm      Heart Rate (Exercise) 107 bpm 95 bpm      Heart Rate (Exit) 90 bpm 78 bpm      Oxygen Saturation (Admit) 90 % --      Oxygen Saturation (Exercise) 98 % --      Oxygen Saturation (Exit) 95 % --      Rating of Perceived Exertion (Exercise) 13 11      Symptoms none none      Duration Continue with 30 min of aerobic exercise without signs/symptoms of physical distress. Continue with 30 min of aerobic exercise without signs/symptoms of physical distress.      Intensity THRR unchanged THRR unchanged             Progression      Progression Continue to progress workloads to maintain intensity without signs/symptoms of physical distress. Continue to progress workloads to maintain intensity without signs/symptoms of physical distress.      Average METs 2.32 2.17             Resistance Training      Training  Prescription Yes Yes      Weight 3 lb 3 lb      Reps 10-15 10-15             Interval Training      Interval Training No No             Treadmill      MPH 2 --      Grade 1 --      Minutes 15 --      METs 2.81 --             NuStep      Level 5 5      Minutes 15 15      METs 2 --             Arm Ergometer      Level 1 1      Minutes 15 15      METs 2 1.8             Track      Laps 21 21      Minutes 15 15      METs 2.14 2.14             Home Exercise Plan      Plans to continue exercise at Home (comment)  walking Home (comment)  walking      Frequency Add 2 additional days to program exercise sessions. Add 2 additional days to program exercise sessions.      Initial Home Exercises Provided 12/21/20 12/21/20              Exercise Comments:   Exercise Comments     Row Name 12/14/20 1102           Exercise Comments First full day of exercise!  Patient was  oriented to gym and equipment including functions, settings, policies, and procedures.  Patient's individual exercise prescription and treatment plan were reviewed.  All starting workloads were established based on the results of the 6 minute walk test done at initial orientation visit.  The plan for exercise progression was also introduced and progression will be customized based on patient's performance and goals.                Exercise Goals and Review:   Exercise Goals     Row Name 11/03/20 1315             Exercise Goals   Increase Physical Activity Yes       Intervention Provide advice, education, support and counseling about physical activity/exercise needs.;Develop an individualized exercise prescription for aerobic and resistive training based on initial evaluation findings, risk stratification, comorbidities and participant's personal goals.       Expected Outcomes Short Term: Attend rehab on a regular basis to increase amount of physical activity.;Long Term: Add in home exercise to  make exercise part of routine and to increase amount of physical activity.;Long Term: Exercising regularly at least 3-5 days a week.       Increase Strength and Stamina Yes       Intervention Provide advice, education, support and counseling about physical activity/exercise needs.;Develop an individualized exercise prescription for aerobic and resistive training based on initial evaluation findings, risk stratification, comorbidities and participant's personal goals.       Expected Outcomes Short Term: Increase workloads from initial exercise prescription for resistance, speed, and METs.;Long Term: Improve cardiorespiratory fitness, muscular endurance and strength as measured by increased METs and functional capacity (6MWT);Short Term: Perform resistance training exercises routinely during rehab and add in resistance training at home       Able to understand and use rate of perceived exertion (RPE) scale Yes       Intervention Provide education and explanation on how to use RPE scale       Expected Outcomes Short Term: Able to use RPE daily in rehab to express subjective intensity level;Long Term:  Able to use RPE to guide intensity level when exercising independently       Able to understand and use Dyspnea scale Yes       Intervention Provide education and explanation on how to use Dyspnea scale       Expected Outcomes Short Term: Able to use Dyspnea scale daily in rehab to express subjective sense of shortness of breath during exertion;Long Term: Able to use Dyspnea scale to guide intensity level when exercising independently       Knowledge and understanding of Target Heart Rate Range (THRR) Yes       Intervention Provide education and explanation of THRR including how the numbers were predicted and where they are located for reference       Expected Outcomes Short Term: Able to state/look up THRR;Short Term: Able to use daily as guideline for intensity in rehab;Long Term: Able to use THRR to govern  intensity when exercising independently       Able to check pulse independently Yes       Intervention Provide education and demonstration on how to check pulse in carotid and radial arteries.;Review the importance of being able to check your own pulse for safety during independent exercise       Expected Outcomes Short Term: Able to explain why pulse checking is important during independent exercise;Long Term: Able to check pulse independently  and accurately       Understanding of Exercise Prescription Yes       Intervention Provide education, explanation, and written materials on patient's individual exercise prescription       Expected Outcomes Short Term: Able to explain program exercise prescription;Long Term: Able to explain home exercise prescription to exercise independently                Exercise Goals Re-Evaluation :  Exercise Goals Re-Evaluation     Row Name 11/15/20 1610 12/14/20 1102 12/21/20 1125 12/26/20 1134 01/10/21 1614     Exercise Goal Re-Evaluation   Exercise Goals Review -- Increase Physical Activity;Understanding of Exercise Prescription;Knowledge and understanding of Target Heart Rate Range (THRR);Able to understand and use rate of perceived exertion (RPE) scale;Increase Strength and Stamina;Able to understand and use Dyspnea scale;Able to check pulse independently Increase Physical Activity;Understanding of Exercise Prescription;Knowledge and understanding of Target Heart Rate Range (THRR);Able to understand and use rate of perceived exertion (RPE) scale;Increase Strength and Stamina;Able to understand and use Dyspnea scale;Able to check pulse independently Increase Physical Activity;Increase Strength and Stamina Increase Physical Activity;Increase Strength and Stamina;Understanding of Exercise Prescription   Comments Has yet to start exercise sessions due to new job Reviewed RPE and dyspnea scales, THR and program prescription with pt today.  Pt voiced understanding  and was given a copy of goals to take home. Reviewed home exercise with pt today.  Pt plans to walk for exercise.   Reviewed THR, pulse, RPE, sign and symptoms, pulse oximetery and when to call 911 or MD.  Also discussed weather considerations and indoor options.  Pt voiced understanding. Bonnie Ochoa is doing well and walked 45 laps on the track!  The arm cracnk is hard for her.  Staff will monitor progress. Bonnie Ochoa continues to do well in rehab.  Her track laps very between 22-41.  We will encourage more consistency on speed and monitor her progress.   Expected Outcomes -- Short: Use RPE daily to regulate intensity. Long: Follow program prescription in THR. Short: Practice taking HR and/or purchase pulse ox and monitor HR during exercise Long: Follow appropriate exercise presciption at home Short: attend consistently Long:  build overall stamina Short: Walk at constant speed on track Long: Continue to improve stamina    Row Name 01/13/21 0958 01/25/21 0745 02/06/21 1012 02/20/21 1034       Exercise Goal Re-Evaluation   Exercise Goals Review Increase Physical Activity;Increase Strength and Stamina Increase Physical Activity;Increase Strength and Stamina Increase Physical Activity;Increase Strength and Stamina Increase Physical Activity;Increase Strength and Stamina    Comments Bonnie Ochoa is walking at home for exercise. She is considering joining a gym when she is done with the program. Informed her that walking at home is good but to make sure she is getting her heart rate up enough. Bonnie Ochoa is up to level 5 on Nustep.  She got 37 laps walking track.  She works at [employer] but doesnt reach THR. Jade walks outside program sessions. We reviewed THR and how to check pulse. Bonnie Ochoa is continuing to do well in rehab. She is not quite hitting her THR and should  increase her pace and laps to help increase her HR. She is tolerating the T4 Nustep at level 5 pretty well. Will continue to monitor.    Expected Outcomes Short: walk faster  if it is to easy. Long: maintain walking at home post [employer] Short: work toward [place]:  increase stamina Short:  practice checking HR during  exercise Long: improve overall stamina Short: Increase numbers on track Long: Continue to increase overall MET level             Discharge Exercise Prescription (Final Exercise Prescription Changes):  Exercise Prescription Changes - 02/20/21 1000       Response to Exercise   Blood Pressure (Admit) 126/64    Blood Pressure (Exit) 132/70    Heart Rate (Admit) 85 bpm    Heart Rate (Exercise) 95 bpm    Heart Rate (Exit) 78 bpm    Rating of Perceived Exertion (Exercise) 11    Symptoms none    Duration Continue with 30 min of aerobic exercise without signs/symptoms of physical distress.    Intensity THRR unchanged      Progression   Progression Continue to progress workloads to maintain intensity without signs/symptoms of physical distress.    Average METs 2.17      Resistance Training   Training Prescription Yes    Weight 3 lb    Reps 10-15      Interval Training   Interval Training No      NuStep   Level 5    Minutes 15      Arm Ergometer   Level 1    Minutes 15    METs 1.8      Track   Laps 21    Minutes 15    METs 2.14      Home Exercise Plan   Plans to continue exercise at Home (comment)   walking   Frequency Add 2 additional days to program exercise sessions.    Initial Home Exercises Provided 12/21/20             Nutrition:  Target Goals: Understanding of nutrition guidelines, daily intake of sodium <1514m, cholesterol <2078m calories 30% from fat and 7% or less from saturated fats, daily to have 5 or more servings of fruits and vegetables.  Education: All About Nutrition: -Group instruction provided by verbal, written material, interactive activities, discussions, models, and posters to present general guidelines for heart healthy nutrition including fat, fiber, MyPlate, the role of sodium in heart  healthy nutrition, utilization of the nutrition label, and utilization of this knowledge for meal planning. Follow up email sent as well. Written material given at graduation. Flowsheet [facility]  Education need identified 11/03/20       Biometrics:  Pre Biometrics - 11/03/20 1315       Pre Biometrics   Height 5' 5"  (1.651 m)    Weight 178 lb 3.2 oz (80.8 kg)    BMI (Calculated) 29.65    Single Leg Stand 10 seconds             Post Biometrics - 02/15/21 1149        Post  Biometrics   Height 5' 5"  (1.651 m)    Weight 189 lb 8 oz (86 kg)    BMI (Calculated) 31.53    Single Leg Stand 3.3 seconds             Nutrition Therapy Plan and Nutrition Goals:  Nutrition Therapy & Goals - 12/28/20 1047       Nutrition Therapy   RD appointment deferred Yes   Jade would not like to talk to RD at this time - will continue to check in.            Nutrition Assessments:  MEDIFICTS Score Key: ?70 Need to  make dietary changes  40-70 Heart Healthy Diet ? 40 Therapeutic Level Cholesterol Diet  Flowsheet Row Cardiac Rehab from 02/17/2021 in [facility] Cardiac and Pulmonary Rehab  Picture Your Plate Total Score on Discharge 65      Picture Your Plate Scores: <26 Unhealthy dietary pattern with much room for improvement. 41-50 Dietary pattern unlikely to meet recommendations for good health and room for improvement. 51-60 More healthful dietary pattern, with some room for improvement.  >60 Healthy dietary pattern, although there may be some specific behaviors that could be improved.    Nutrition Goals Re-Evaluation:  Nutrition Goals Re-Evaluation     [employer] Name 12/21/20 1144 01/13/21 1002 02/06/21 1011         Goals   Current Weight -- 185 lb (83.9 kg) --     Nutrition Goal -- Meet with the dietician. Has not met with RD     Comment Bonnie Ochoa has not met with Lenna Sciara, RD yet to establish nutrition goals. She did not meet  yet as she missed her appt and will reschedule to get a new one. Patient states that she ate a breakfast sandwich this morning from a fast fookd restaruant. She has a sandwich and chips for lunch. She will eat steak, chicken and some vegetables. --     Expected Outcome Short: Get appointment with RD Long: Maintain heart healthy diet Short: meet with the dietician. Long: adhere to a diet that pertains to her needs. Short: meet with RD Long: develop heart healthy diet              Nutrition Goals Discharge (Final Nutrition Goals Re-Evaluation):  Nutrition Goals Re-Evaluation - 02/06/21 1011       Goals   Nutrition Goal Has not met with RD    Expected Outcome Short: meet with RD Long: develop heart healthy diet             Psychosocial: Target Goals: Acknowledge presence or absence of significant depression and/or stress, maximize coping skills, provide positive support system. Participant is able to verbalize types and ability to use techniques and skills needed for reducing stress and depression.   Education: Stress, Anxiety, and Depression - Group verbal and visual presentation to define topics covered.  Reviews how body is impacted by stress, anxiety, and depression.  Also discusses healthy ways to reduce stress and to treat/manage anxiety and depression.  Written material given at graduation. Flowsheet Row Cardiac Rehab from 02/08/2021 in [facility] Cardiac and Pulmonary Rehab  Education need identified 11/03/20       Education: Sleep Hygiene -Provides group verbal and written instruction about how sleep can affect your health.  Define sleep hygiene, discuss sleep cycles and impact of sleep habits. Review good sleep hygiene tips.    Initial Review & Psychosocial Screening:  Initial Psych Review & Screening - 10/26/20 1048       Initial Review   Current issues with Current Depression;Current Psychotropic Meds      Family Dynamics   Good Support System? Yes   wife Darryll Capers,  sister, children live in [place]     Barriers   Psychosocial barriers to participate in program There are no identifiable barriers or psychosocial needs.      Screening Interventions   Interventions Encouraged to exercise;To provide support and resources with identified psychosocial needs;Provide feedback about the scores to participant    Expected Outcomes Short Term goal: Utilizing psychosocial counselor, staff and physician to assist with identification of specific Stressors or current issues interfering  with healing process. Setting desired goal for each stressor or current issue identified.;Long Term Goal: Stressors or current issues are controlled or eliminated.;Short Term goal: Identification and review with participant of any Quality of Life or Depression concerns found by scoring the questionnaire.;Long Term goal: The participant improves quality of Life and PHQ9 Scores as seen by post scores and/or verbalization of changes             Quality of Life Scores:   Quality of Life - 02/17/21 1003       Quality of Life   Select Quality of Life      Quality of Life Scores   Health/Function Pre 17.5 %    Health/Function Post 18.92 %    Health/Function % Change 8.11 %    Socioeconomic Pre 18.56 %    Socioeconomic Post 24.58 %    Socioeconomic % Change  32.44 %    Psych/Spiritual Pre 25.14 %    Psych/Spiritual Post 25 %    Psych/Spiritual % Change -0.56 %    Family Pre 25.2 %    Family Post 27.6 %    Family % Change 9.52 %    GLOBAL Pre 20.37 %    GLOBAL Post 22.72 %    GLOBAL % Change 11.54 %            Scores of 19 and below usually indicate a poorer quality of life in these areas.  A difference of  2-3 points is a clinically meaningful difference.  A difference of 2-3 points in the total score of the Quality of Life Index has been associated with significant improvement in overall quality of life, self-image, physical symptoms, and general health in studies assessing  change in quality of life.  PHQ-9: Recent Review Flowsheet Data     Depression screen [facility] 2/9 02/17/2021 01/10/2021 11/03/2020 09/09/2020 07/11/2020   Decreased Interest 3 1 0 0 2   Down, Depressed, Hopeless 3 2 2  0 1   PHQ - 2 Score 6 3 2  0 3   Altered sleeping 3 0 0 - 0   Tired, decreased energy 3 1 1  - 3   Change in appetite 1 0 0 - 0   Feeling bad or failure about yourself  3 2 2  - 0   Trouble concentrating 2 1 2  - 0   Moving slowly or fidgety/restless 0 3 0 - 0   Suicidal thoughts 1 0 0 - 0   PHQ-9 Score 19 10 7  - 6   Difficult doing work/chores Somewhat difficult Not difficult at all Not difficult at all - -      Interpretation of Total Score  Total Score Depression Severity:  1-4 = Minimal depression, 5-9 = Mild depression, 10-14 = Moderate depression, 15-19 = Moderately severe depression, 20-27 = Severe depression   Psychosocial Evaluation and Intervention:  Psychosocial Evaluation - 10/26/20 1111       Psychosocial Evaluation & Interventions   Interventions Encouraged to exercise with the program and follow exercise prescription    Comments Bonnie Ochoa has no barriers to starting the program. She has history of depression and is under the care of physician and has Paxil prscribed. She moved to [place] in the past 5 years with he sister. SHe is married for 3 years to her wife LaTricia.  She has grown children that live in [place]. Her wife aand sister are her support system. THey all live together in a home with no pets. Her goal is to  increase her energy. She should do well in the program.    Expected Outcomes STG: Bonnie Ochoa attends all scheduled sessions, and is able to progress her exercise prescription over time. LTG: she has gained more energy/stamina and is able to maintain this with continuing what she learned in the program. [facility] keeps her depression under control with continued meds and counseling    Continue Psychosocial Services  Follow up required by staff             Psychosocial  Re-Evaluation:  Psychosocial Re-Evaluation     [place] Name 12/21/20 1140 01/13/21 1007 02/06/21 1009         Psychosocial Re-Evaluation   Current issues with Current Depression;Current Psychotropic Meds Current Depression;History of Depression;Current Psychotropic Meds Current Depression;History of Depression;Current Psychotropic Meds     Comments Bonnie Ochoa reports that she is doing well. She continues to have ongoing support from her wife and kids. She is currently on medication for depression and is in the process of switching providers for a therapist. She reports the medication is helping and does not think she needs any other intervention at this time. Reports that sleep is good and has no complaints. Bonnie Ochoa feels like she is depressed since her mother died in October 26, 2012. She has no other concernes since last follow up. Bonnie Ochoa takes meds as directed for depression.  She feels exercise has helped some.  She sleeps well and has no other concerns     Expected Outcomes Short: Establish new provider for therapy Long: Utilize exercise for stress management and maintain positive attitude Short: Continue to exercise regularly to support mental health and notify staff of any changes. Long: maintain mental health and well being through teaching of rehab or prescribed medications independently. Short: continue to exercise and take meds as directed Long:  attend relaxation education for review on breathing techniques     Interventions Encouraged to attend Cardiac Rehabilitation for the exercise Encouraged to attend Cardiac Rehabilitation for the exercise --     Continue Psychosocial Services  Follow up required by staff Follow up required by staff --              Psychosocial Discharge (Final Psychosocial Re-Evaluation):  Psychosocial Re-Evaluation - 02/06/21 1009       Psychosocial Re-Evaluation   Current issues with Current Depression;History of Depression;Current Psychotropic Meds    Comments Bonnie Ochoa takes meds as  directed for depression.  She feels exercise has helped some.  She sleeps well and has no other concerns    Expected Outcomes Short: continue to exercise and take meds as directed Long:  attend relaxation education for review on breathing techniques             Vocational Rehabilitation: Provide vocational rehab assistance to qualifying candidates.   Vocational Rehab Evaluation & Intervention:  Vocational Rehab - 10/26/20 1053       Initial Vocational Rehab Evaluation & Intervention   Assessment shows need for Vocational Rehabilitation No             Education: Education Goals: Education classes will be provided on a variety of topics geared toward better understanding of heart health and risk factor modification. Participant will state understanding/return demonstration of topics presented as noted by education test scores.  Learning Barriers/Preferences:  Learning Barriers/Preferences - 10/26/20 1053       Learning Barriers/Preferences   Learning Barriers None    Learning Preferences None             General  Cardiac Education Topics:  AED/CPR: - Group verbal and written instruction with the use of models to demonstrate the basic use of the AED with the basic ABC's of resuscitation.   Anatomy and Cardiac Procedures: - Group verbal and visual presentation and models provide information about basic cardiac anatomy and function. Reviews the testing methods done to diagnose heart disease and the outcomes of the test results. Describes the treatment choices: Medical Management, Angioplasty, or Coronary Bypass Surgery for treating various heart conditions including Myocardial Infarction, Angina, Valve Disease, and Cardiac Arrhythmias.  Written material given at graduation. Flowsheet Row Cardiac Rehab from 02/08/2021 in [facility] Cardiac and Pulmonary Rehab  Education need identified 11/03/20  Date 02/08/21  Educator SB  Instruction Review Code 1- Verbalizes Understanding        Medication Safety: - Group verbal and visual instruction to review commonly prescribed medications for heart and lung disease. Reviews the medication, class of the drug, and side effects. Includes the steps to properly store meds and maintain the prescription regimen.  Written material given at graduation. Flowsheet Row Cardiac Rehab from 02/08/2021 in [facility] Cardiac and Pulmonary Rehab  Date 12/28/20  Educator SB  Instruction Review Code 1- Verbalizes Understanding       Intimacy: - Group verbal instruction through game format to discuss how heart and lung disease can affect sexual intimacy. Written material given at graduation.. Flowsheet Row Cardiac Rehab from 02/08/2021 in [facility] Cardiac and Pulmonary Rehab  Date 02/01/21  Educator [facility]  Instruction Review Code 1- Verbalizes Understanding       Know Your Numbers and Heart Failure: - Group verbal and visual instruction to discuss disease risk factors for cardiac and pulmonary disease and treatment options.  Reviews associated critical values for Overweight/Obesity, Hypertension, Cholesterol, and Diabetes.  Discusses basics of heart failure: signs/symptoms and treatments.  Introduces Heart Failure Zone chart for action plan for heart failure.  Written material given at graduation. Flowsheet Row Cardiac Rehab from 02/08/2021 in [facility] Cardiac and Pulmonary Rehab  Date 01/04/21  Educator [facility]  Instruction Review Code 1- Verbalizes Understanding       Infection Prevention: - Provides verbal and written material to individual with discussion of infection control including proper hand washing and proper equipment cleaning during exercise session. Flowsheet Row Cardiac Rehab from 02/08/2021 in [facility] Cardiac and Pulmonary Rehab  Date 11/03/20  Educator [facility]  Instruction Review Code 1- Verbalizes Understanding       Falls Prevention: - Provides verbal and written material to individual with discussion of falls prevention and  safety. Flowsheet Row Cardiac Rehab from 02/08/2021 in [facility] Cardiac and Pulmonary Rehab  Date 10/26/20  Educator SB  Instruction Review Code 1- Verbalizes Understanding       Other: -Provides group and verbal instruction on various topics (see comments)   Knowledge Questionnaire Score:  Knowledge Questionnaire Score - 02/17/21 1003       Knowledge Questionnaire Score   Post Score 18/26             Core Components/Risk Factors/Patient Goals at Admission:  Personal Goals and Risk Factors at Admission - 11/03/20 1317       Core Components/Risk Factors/Patient Goals on Admission    Weight Management Yes;Weight Maintenance    Intervention Weight Management: Develop a combined nutrition and exercise program designed to reach desired caloric intake, while maintaining appropriate intake of nutrient and fiber, sodium and fats, and appropriate energy expenditure required for the weight goal.;Weight Management: Provide education and appropriate resources to help  participant work on and attain dietary goals.;Obesity: Provide education and appropriate resources to help participant work on and attain dietary goals.    Admit Weight 178 lb 3.2 oz (80.8 kg)    Goal Weight: Short Term 173 lb (78.5 kg)    Goal Weight: Long Term 165 lb (74.8 kg)    Expected Outcomes Short Term: Continue to assess and modify interventions until short term weight is achieved;Long Term: Adherence to nutrition and physical activity/exercise program aimed toward attainment of established weight goal;Weight Loss: Understanding of general recommendations for a balanced deficit meal plan, which promotes 1-2 lb weight loss per week and includes a negative energy balance of [phone] kcal/d;Understanding recommendations for meals to include 15-35% energy as protein, 25-35% energy from fat, 35-60% energy from carbohydrates, less than 225m of dietary cholesterol, 20-35 gm of total fiber daily;Understanding of distribution of  calorie intake throughout the day with the consumption of 4-5 meals/snacks    Diabetes Yes    Intervention Provide education about signs/symptoms and action to take for hypo/hyperglycemia.;Provide education about proper nutrition, including hydration, and aerobic/resistive exercise prescription along with prescribed medications to achieve blood glucose in normal ranges: Fasting glucose 65-99 mg/dL    Expected Outcomes Short Term: Participant verbalizes understanding of the signs/symptoms and immediate care of hyper/hypoglycemia, proper foot care and importance of medication, aerobic/resistive exercise and nutrition plan for blood glucose control.;Long Term: Attainment of HbA1C < 7%.    Heart Failure Yes    Intervention Provide a combined exercise and nutrition program that is supplemented with education, support and counseling about heart failure. Directed toward relieving symptoms such as shortness of breath, decreased exercise tolerance, and extremity edema.    Expected Outcomes Improve functional capacity of life;Short term: Attendance in program 2-3 days a week with increased exercise capacity. Reported lower sodium intake. Reported increased fruit and vegetable intake. Reports medication compliance.;Short term: Daily weights obtained and reported for increase. Utilizing diuretic protocols set by physician.;Long term: Adoption of self-care skills and reduction of barriers for early signs and symptoms recognition and intervention leading to self-care maintenance.    Hypertension Yes    Intervention Provide education on lifestyle modifcations including regular physical activity/exercise, weight management, moderate sodium restriction and increased consumption of fresh fruit, vegetables, and low fat dairy, alcohol moderation, and smoking cessation.;Monitor prescription use compliance.    Expected Outcomes Short Term: Continued assessment and intervention until BP is < 140/915mHG in hypertensive  participants. < 130/8089mG in hypertensive participants with diabetes, heart failure or chronic kidney disease.;Long Term: Maintenance of blood pressure at goal levels.    Lipids Yes    Intervention Provide education and support for participant on nutrition & aerobic/resistive exercise along with prescribed medications to achieve LDL <66m75mDL >40mg22m Expected Outcomes Short Term: Participant states understanding of desired cholesterol values and is compliant with medications prescribed. Participant is following exercise prescription and nutrition guidelines.;Long Term: Cholesterol controlled with medications as prescribed, with individualized exercise RX and with personalized nutrition plan. Value goals: LDL < 66mg,57m > 40 mg.             Education:Diabetes - Individual verbal and written instruction to review signs/symptoms of diabetes, desired ranges of glucose level fasting, after meals and with exercise. Acknowledge that pre and post exercise glucose checks will be done for 3 sessions at entry of program.   Core Components/Risk Factors/Patient Goals Review:   Goals and Risk Factor Review     Row Name 12/21/20 1133  01/13/21 1001 02/06/21 1006         Core Components/Risk Factors/Patient Goals Review   Personal Goals Review Weight Management/Obesity;Heart Failure;Hypertension;Diabetes Weight Management/Obesity;Diabetes Weight Management/Obesity;Diabetes     Review Weight has been stable, she does weigh her self at home and is aware to report and look for any sudden weight gain. At this time, she denies any symptoms for heart failure. We did discuss a hand out for heart failure, symptoms to look out for and has a take home handout to reference to. She ran out of test strips but normally checks her sugars twice/day. Her doctor wants it around 100 and reports her sugars around 90-low 100s. Her A1C trend has decreased and applauded her to continue lowering that number. I did stress  importance of checking sugars, especially if ever symptomatic, and encouraged her to get new test strips as soon as possible. She does not check her BP and was also encouraged to get a cuff for home use. -- Bonnie Ochoa reports her weight has been going up and down.  She reports no symptoms of heart failure.  Her FBG has been around 86.  She doesnt have a BP cuff yet.     Expected Outcomes Short: Obtain more test strips and home BP cuff Long: Continue to manage lifestyle risk factors -- Short:  get BP cuff for home Long: continue to manage risk factors              Core Components/Risk Factors/Patient Goals at Discharge (Final Review):   Goals and Risk Factor Review - 02/06/21 1006       Core Components/Risk Factors/Patient Goals Review   Personal Goals Review Weight Management/Obesity;Diabetes    Review Bonnie Ochoa reports her weight has been going up and down.  She reports no symptoms of heart failure.  Her FBG has been around 86.  She doesnt have a BP cuff yet.    Expected Outcomes Short:  get BP cuff for home Long: continue to manage risk factors             ITP Comments:  ITP Comments     Row Name 10/26/20 1110 11/03/20 1310 11/04/20 0830 11/15/20 1610 11/30/20 0622   ITP Comments Virtual orientation call completed today. shehas an appointment on Date: 11/03/2020 for EP eval and gym Orientation.  Documentation of diagnosis [facility] be found in [facility]  Date: 06/29/2020. Completed 6MWT and gym orientation. Initial ITP created and sent for review to Dr. Emily Filbert, Medical Director. Bonnie Ochoa called to let us know that she has started a new job and will be working from [employer] each day.  Her current time slot of 945 will not work with her new scheduled.  She would like to wait two weeks before starting rehab to get used to her job and to see which time would work best for her.  We will touch base again in two weeks. Waiting to hear back from new job info. 30 Day review completed. Medical Director ITP review done,  changes made as directed, and signed approval by Medical Director.No visits in April    Row Name 12/14/20 1102 12/28/20 0746 01/25/21 0851 02/22/21 1124     ITP Comments First full day of exercise!  Patient was oriented to gym and equipment including functions, settings, policies, and procedures.  Patient's individual exercise prescription and treatment plan were reviewed.  All starting workloads were established based on the results of the 6 minute walk test done at initial orientation visit.  The  plan for exercise progression was also introduced and progression will be customized based on patient's performance and goals. 30 Day review completed. Medical Director ITP review done, changes made as directed, and signed approval by Medical Director.  New to program 30 Day review completed. Medical Director ITP review done, changes made as directed, and signed approval by Medical Director. 30 Day review completed. Medical Director ITP review done, changes made as directed, and signed approval by Medical Director.             Comments:

## 2021-02-24 ENCOUNTER — Other Ambulatory Visit: Payer: Self-pay | Admitting: Psychiatry

## 2021-02-24 DIAGNOSIS — F3131 Bipolar disorder, current episode depressed, mild: Secondary | ICD-10-CM

## 2021-03-02 ENCOUNTER — Telehealth: Payer: Self-pay

## 2021-03-02 NOTE — Telephone Encounter (Signed)
LM with sister - having transportation issues  Gave [facility] information to sister

## 2021-03-06 ENCOUNTER — Encounter: Payer: Self-pay | Admitting: *Deleted

## 2021-03-06 DIAGNOSIS — I5022 Chronic systolic (congestive) heart failure: Secondary | ICD-10-CM

## 2021-03-07 ENCOUNTER — Other Ambulatory Visit: Payer: Self-pay | Admitting: Psychiatry

## 2021-03-07 DIAGNOSIS — F5105 Insomnia due to other mental disorder: Secondary | ICD-10-CM

## 2021-03-09 DIAGNOSIS — I5022 Chronic systolic (congestive) heart failure: Secondary | ICD-10-CM

## 2021-03-09 NOTE — Progress Notes (Signed)
Called Bonnie Ochoa to see if she would like to graduate at 32 visits as she only has four left. LM.

## 2021-03-13 ENCOUNTER — Ambulatory Visit: Payer: Medicare Other

## 2021-03-14 ENCOUNTER — Other Ambulatory Visit: Payer: Self-pay | Admitting: Family Medicine

## 2021-03-14 NOTE — Telephone Encounter (Signed)
Patient last seen in May and has appointment 04/13/21

## 2021-03-14 NOTE — Telephone Encounter (Signed)
Requested medication (s) are due for refill today: DUe 04/01/21  Requested medication (s) are on the active medication list: yes   Last refill: 01/30/21   #60   1 refill  Future visit scheduled no  Notes to clinic:not delegated  Requested Prescriptions  Pending Prescriptions Disp Refills   cyclobenzaprine (FLEXERIL) 5 MG tablet [Pharmacy Med Name: CYCLOBENZAPRINE 5 MG TABLET] 60 tablet 1    Sig: TAKE 1 TABLET BY MOUTH THREE TIMES A DAY AS NEEDED FOR MUSCLE SPASMS      Not Delegated - Analgesics:  Muscle Relaxants Failed - 03/14/2021 11:30 AM      Failed - This refill cannot be delegated      Passed - Valid encounter within last 6 months    Recent Outpatient Visits           2 months ago Chest pain, unspecified type   [facility], Megan P, DO   8 months ago Type 2 diabetes mellitus with stage 3a chronic kidney disease, with long-term current use of insulin (HCC)   [facility] [place], Megan P, DO   10 months ago Type 2 diabetes mellitus with stage 3a chronic kidney disease, with long-term current use of insulin (HCC)   [facility] [place], Megan P, DO   11 months ago Type 2 diabetes mellitus with stage 3a chronic kidney disease, with long-term current use of insulin (HCC)   [facility] [place], Megan P, DO   11 months ago Procedure not carried out   [facility], [employer], DO       Future Appointments             In 1 month Johnson, Oralia Rud, DO [employer], [facility]   In 1 month Gollan, Tollie Pizza, MD [employer]   In 6 months  [employer] [facility], [facility]

## 2021-03-15 ENCOUNTER — Ambulatory Visit: Payer: Medicare Other

## 2021-03-17 ENCOUNTER — Ambulatory Visit: Payer: Medicare Other

## 2021-03-20 ENCOUNTER — Other Ambulatory Visit: Payer: Self-pay | Admitting: Family Medicine

## 2021-03-20 ENCOUNTER — Ambulatory Visit: Payer: Medicare Other

## 2021-03-21 ENCOUNTER — Encounter: Payer: Medicare Other | Admitting: Internal Medicine

## 2021-03-21 ENCOUNTER — Other Ambulatory Visit: Payer: Self-pay | Admitting: Family Medicine

## 2021-03-21 ENCOUNTER — Other Ambulatory Visit: Payer: Self-pay | Admitting: Psychiatry

## 2021-03-21 DIAGNOSIS — F3131 Bipolar disorder, current episode depressed, mild: Secondary | ICD-10-CM

## 2021-03-21 NOTE — Telephone Encounter (Signed)
Requested Prescriptions  Pending Prescriptions Disp Refills  . LANTUS SOLOSTAR 100 UNIT/ML Solostar Pen [Pharmacy Med Name: LANTUS SOLOSTAR 100 UNIT/ML] 15 mL 0    Sig: INJECT 35 UNITS INTO THE SKIN DAILY AT 10 PM.     Endocrinology:  Diabetes - Insulins Failed - 03/21/2021  8:24 PM      Failed - HBA1C is between 0 and 7.9 and within 180 days    Hemoglobin A1C  Date Value Ref Range Status  02/19/2018 7.8  Final   HB A1C (BAYER DCA - WAIVED)  Date Value Ref Range Status  01/10/2021 8.7 (H) <7.0 % Final    Comment:                                          Diabetic Adult            <7.0                                       Healthy Adult        4.3 - 5.7                                                           (DCCT/NGSP) American Diabetes Association's Summary of Glycemic Recommendations for Adults with Diabetes: Hemoglobin A1c <7.0%. More stringent glycemic goals (A1c <6.0%) may further reduce complications at the cost of increased risk of hypoglycemia.          Passed - Valid encounter within last 6 months    Recent Outpatient Visits          2 months ago Chest pain, unspecified type   [facility] [place], Megan P, DO   8 months ago Type 2 diabetes mellitus with stage 3a chronic kidney disease, with long-term current use of insulin (HCC)   [facility] [place], Megan P, DO   10 months ago Type 2 diabetes mellitus with stage 3a chronic kidney disease, with long-term current use of insulin (HCC)   [facility] [place], Megan P, DO   11 months ago Type 2 diabetes mellitus with stage 3a chronic kidney disease, with long-term current use of insulin (HCC)   [facility] [place], Megan P, DO   12 months ago Procedure not carried out   [employer], [place], DO      Future Appointments            In 3 weeks Laural Benes, Oralia Rud, DO [facility], [facility]   In 4 weeks Gollan, Tollie Pizza, MD [employer] [facility]   In 5 months  [facility], [facility]

## 2021-03-22 ENCOUNTER — Ambulatory Visit: Payer: Medicare Other

## 2021-03-22 ENCOUNTER — Encounter: Payer: Self-pay | Admitting: *Deleted

## 2021-03-22 DIAGNOSIS — I5022 Chronic systolic (congestive) heart failure: Secondary | ICD-10-CM

## 2021-03-22 NOTE — Progress Notes (Signed)
Cardiac Individual Treatment Plan  Patient Details  Name: Bonnie Ochoa MRN: [id number] Date of Birth: Jan 19, 1966 Referring Provider:   Flowsheet Row Cardiac Rehab from 11/03/2020 in [facility] Cardiac and Pulmonary Rehab  Referring Provider Glori Bickers MD  [facility] Cardiologist: Dr. Ida Rogue       Initial Encounter Date:  Flowsheet Row Cardiac Rehab from 11/03/2020 in [facility] Cardiac and Pulmonary Rehab  Date 11/03/20       Visit Diagnosis: Chronic systolic heart failure ([place])  Patient's Home Medications on Admission:  Current Outpatient Medications:    blood glucose meter kit and supplies KIT, Dispense based on patient and insurance preference. Use up to four times daily as directed. (FOR ICD-9 250.00, 250.01)., Disp: 1 each, Rfl: 0   carvedilol (COREG) 12.5 MG tablet, Take 1 tablet (12.5 mg total) by mouth 2 (two) times daily with a meal., Disp: 180 tablet, Rfl: 3   cyclobenzaprine (FLEXERIL) 5 MG tablet, TAKE 1 TABLET BY MOUTH THREE TIMES A DAY AS NEEDED FOR MUSCLE SPASMS, Disp: 60 tablet, Rfl: 1   digoxin (LANOXIN) 0.125 MG tablet, TAKE 1 TABLET BY MOUTH DAILY, Disp: 90 tablet, Rfl: 3   famotidine (PEPCID) 40 MG tablet, TAKE 1 TABLET BY MOUTH EVERY DAY IN THE EVENING, Disp: 90 tablet, Rfl: 0   furosemide (LASIX) 20 MG tablet, Take 1 tablet (20 mg total) by mouth as directed. Take 18m (1 tablet) Monday, Wednesday, Friday., Disp: 40 tablet, Rfl: 3   Insulin Pen Needle 32G X 6 MM MISC, 1 each by Does not apply route daily., Disp: 100 each, Rfl: 12   JARDIANCE 25 MG TABS tablet, TAKE 1 TABLET BY MOUTH EVERY DAY, Disp: 90 tablet, Rfl: 1   lamoTRIgine (LAMICTAL) 150 MG tablet, TAKE 1 TABLET BY MOUTH EVERY DAY, Disp: 30 tablet, Rfl: 0   LANTUS SOLOSTAR 100 UNIT/ML Solostar Pen, INJECT 35 UNITS INTO THE SKIN DAILY AT 10 PM., Disp: 15 mL, Rfl: 0   metoCLOPramide (REGLAN) 10 MG tablet, Take 10 mg by mouth 4 (four) times daily., Disp: , Rfl:    pantoprazole (PROTONIX) 40 MG tablet,  TAKE 1 TABLET BY MOUTH EVERY DAY, Disp: 90 tablet, Rfl: 1   PARoxetine (PAXIL) 30 MG tablet, Take 1 tablet (30 mg total) by mouth daily., Disp: 90 tablet, Rfl: 1   potassium chloride SA (KLOR-CON M20) 20 MEQ tablet, TAKE 2 TABLETS (40 MEQ TOTAL) BY MOUTH 2 (TWO) TIMES DAILY., Disp: 180 tablet, Rfl: 3   risperiDONE (RISPERDAL) 0.25 MG tablet, TAKE 1 TABLET (0.25 MG TOTAL) BY MOUTH EVERY MORNING., Disp: 90 tablet, Rfl: 1   rosuvastatin (CRESTOR) 40 MG tablet, Take 1 tablet (40 mg total) by mouth daily., Disp: 90 tablet, Rfl: 3   sacubitril-valsartan (ENTRESTO) 97-103 MG, Take 1 tablet by mouth 2 (two) times daily., Disp: 60 tablet, Rfl: 11   spironolactone (ALDACTONE) 25 MG tablet, Take 1 tablet (25 mg total) by mouth daily., Disp: 90 tablet, Rfl: 3   sucralfate (CARAFATE) 1 g tablet, TAKE 1 TABLET (1 G TOTAL) BY MOUTH 4 (FOUR) TIMES DAILY., Disp: 360 tablet, Rfl: 3   zolpidem (AMBIEN) 10 MG tablet, TAKE 1 TABLET BY MOUTH AT BEDTIME AS NEEDED FOR SLEEP., Disp: 21 tablet, Rfl: 0  Past Medical History: Past Medical History:  Diagnosis Date   Abdominal pain    a. 05/2018 HIDA scan wnl.   ADHD    AICD (automatic cardioverter/defibrillator) present    a. 11/2014 s/p SJM Fortify Assura, single lead AICD (ser# [id number].  Anemia    Arthritis    Asthma    Bipolar 1 disorder ([place])    Chest pain    a. Hx of cath in [place] - reportedly nl; b. 04/2018 MV: EF 22%, fixed dist ant septal, apical, and inferoapical defects - ? scar vs. attenuation. No ischemia.   CHF (congestive heart failure) (HCC)    Cirrhosis of liver ([place])    Coronary artery disease    Depression    Diabetes mellitus without complication ([place])    Diverticulitis    Dysrhythmia    Heart murmur    HFrEF (heart failure with reduced ejection fraction) ([place])    a. 06/2017 Echo: EF 20-25%, diff HK. Mildly dil LA; b. 05/2018 Echo: EF 25-30%, diff HK, Gr2 DD. Triv AI. Mod MR. Mildly reduced RV fxn. Mod-Sev TR. PASP 45-64mHg.   Hypertension     Hypothyroidism    IBS (irritable bowel syndrome)    Insomnia    Migraines    NICM (nonischemic cardiomyopathy) ([place]    a. EF prev 25%; b. 11/2014 s/p SJM Fortify Assura, single lead AICD (ser# [id number]; c. 06/2017 Echo: EF 20-25%; d. 05/2018 Echo: EF 25-30%, diff HK, Gr2 DD; e. 05/2018 Echo: EF 25-30%; f. 05/2018 Cath: Nl cors. LVEDP 284mg, PCWP 3218m. PA 65/40 (52). CO/CI 3.04/1.56; c. 07/2018 CPX: Mod HF limitation.   OSA (obstructive sleep apnea)    CPAP is broken   PTSD (post-traumatic stress disorder)    Restless leg syndrome    Vertigo     Tobacco Use: Social History   Tobacco Use  Smoking Status Former   Types: Cigarettes   Quit date: 1985   Years since quitting: 37.6  Smokeless Tobacco Never  Tobacco Comments   quit over  20 years ago     Labs: Recent RevChemical engineer  Labs for ITP Cardiac and Pulmonary Rehab Latest Ref Rng & Units 09/28/2019 12/25/2019 04/06/2020 07/11/2020 01/10/2021   Cholestrol 100 - 199 mg/dL 214(H) - - 152 188   LDLCALC 0 - 99 mg/dL 121(H) - - 84 93   HDL >39 mg/dL 36(L) - - 39(L) 35(L)   Trlycerides 0 - 149 mg/dL 321(H) - - 168(H) 359(H)   Hemoglobin A1c <7.0 % 9.9(H) 10.1(H) 8.5(H) 6.8 8.7(H)        Exercise Target Goals: Exercise Program Goal: Individual exercise prescription set using results from initial 6 min walk test and THRR while considering  patient's activity barriers and safety.   Exercise Prescription Goal: Initial exercise prescription builds to 30-45 minutes a day of aerobic activity, 2-3 days per week.  Home exercise guidelines will be given to patient during program as part of exercise prescription that the participant will acknowledge.   Education: Aerobic Exercise: - Group verbal and visual presentation on the components of exercise prescription. Introduces F.I.T.T principle from ACSM for exercise prescriptions.  Reviews F.I.T.T. principles of aerobic exercise including progression. Written material given at  graduation. Flowsheet [facility]  Education need identified 11/03/20  Date 02/01/21  Educ[facility] Code 1- Verbalizes Understanding       Education: Resistance Exercise: - Group verbal and visual presentation on the components of exercise prescription. Introduces F.I.T.T principle from ACSM for exercise prescriptions  Reviews F.I.T.T. principles of resistance exercise including progression. Written material given at graduation. Flowsheet Row [facility]  Date 02/08/21  [facility]  Instruction Review Code 1- Verbalizes Understanding        Education: Exercise & Equipment Safety: - Individual verbal instruction and demonstration of equipment use and safety with use of the equipment. Flowsheet Row Cardiac Rehab from 02/08/2021 in [facility] Cardiac and Pulmonary Rehab  Date 11/03/20  Educator [facility]  Instruction Review Code 1- Verbalizes Understanding       Education: Exercise Physiology & General Exercise Guidelines: - Group verbal and written instruction with models to review the exercise physiology of the cardiovascular system and associated critical values. Provides general exercise guidelines with specific guidelines to those with heart or lung disease.    Education: Flexibility, Balance, Mind/Body Relaxation: - Group verbal and visual presentation with interactive activity on the components of exercise prescription. Introduces F.I.T.T principle from ACSM for exercise prescriptions. Reviews F.I.T.T. principles of flexibility and balance exercise training including progression. Also discusses the mind body connection.  Reviews various relaxation techniques to help reduce and manage stress (i.e. Deep breathing, progressive muscle relaxation, and visualization). Balance handout provided to take home. Written material given at graduation. Flowsheet Row Cardiac Rehab  from 02/08/2021 in [facility] Cardiac and Pulmonary Rehab  Date 12/14/20  Educator AS  Instruction Review Code 1- Verbalizes Understanding       Activity Barriers & Risk Stratification:  Activity Barriers & Cardiac Risk Stratification - 11/03/20 1312       Activity Barriers & Cardiac Risk Stratification   Activity Barriers Chest Pain/Angina;Back Problems;Muscular Weakness;Deconditioning;Balance Concerns    Cardiac Risk Stratification High             6 Minute Walk:  6 Minute Walk     Row Name 11/03/20 1311 02/15/21 1149       6 Minute Walk   Phase Initial Discharge    Distance 1095 feet 1305 feet    Distance % Change -- 19.2 %    Distance Feet Change -- 210 ft    Walk Time 6 minutes 6 minutes    # of Rest Breaks 0 0    MPH 2.07 2.47    METS 3.1 3.39    RPE 13 12    VO2 Peak 10.86 11.88    Symptoms Yes (comment) No    Comments R thigh pain 4/10 --    Resting HR 59 bpm 88 bpm    Resting BP 142/84 122/76    Resting Oxygen Saturation  97 % 97 %    Exercise Oxygen Saturation  during 6 min walk 99 % 98 %    Max Ex. HR 81 bpm 106 bpm    Max Ex. BP 154/86 128/72    2 Minute Post BP 138/80 --             Oxygen Initial Assessment:   Oxygen Re-Evaluation:   Oxygen Discharge (Final Oxygen Re-Evaluation):   Initial Exercise Prescription:  Initial Exercise Prescription - 11/03/20 1300       Date of Initial Exercise RX and Referring Provider   Date 11/03/20    Referring Provider Glori Bickers MD   Primary Cardiologist: Dr. Ida Rogue     Treadmill   MPH 2    Grade 1    Minutes 15    METs 2.81      Recumbant Bike   Level 2    RPM 50    Watts 28    Minutes 15    METs 2      Arm Ergometer   Level 1    Watts 25  RPM 25    Minutes 15    METs 2      REL-XR   Level 3    Speed 50    Minutes 15    METs 2      Prescription Details   Frequency (times per week) 3    Duration Progress to 30 minutes of continuous aerobic without  signs/symptoms of physical distress      Intensity   THRR 40-80% of Max Heartrate 101-143    Ratings of Perceived Exertion 11-13    Perceived Dyspnea 0-4      Progression   Progression Continue to progress workloads to maintain intensity without signs/symptoms of physical distress.      Resistance Training   Training Prescription Yes    Weight 3 lb    Reps 10-15             Perform Capillary Blood Glucose checks as needed.  Exercise Prescription Changes:   Exercise Prescription Changes     Row Name 11/03/20 1300 12/21/20 1100 12/26/20 1100 01/10/21 1600 01/25/21 0700     Response to Exercise   Blood Pressure (Admit) 142/84 -- 122/72 114/70 118/62   Blood Pressure (Exercise) 154/86 -- 118/70 142/78 128/64   Blood Pressure (Exit) 138/80 -- 108/62 108/64 102/62   Heart Rate (Admit) 59 bpm -- 85 bpm 78 bpm 66 bpm   Heart Rate (Exercise) 81 bpm -- 88 bpm 105 bpm 75 bpm   Heart Rate (Exit) 70 bpm -- 85 bpm 85 bpm 75 bpm   Oxygen Saturation (Admit) 97 % -- -- -- --   Oxygen Saturation (Exercise) 99 % -- -- -- --   Rating of Perceived Exertion (Exercise) 13 -- _0 Symptoms r thigh pain 4/10 -- none none none   Comments walk test results -- -- -- --   Duration -- -- Progress to 30 minutes of  aerobic without signs/symptoms of physical distress Continue with 30 min of aerobic exercise without signs/symptoms of physical distress. Progress to 30 minutes of  aerobic without signs/symptoms of physical distress   Intensity -- -- THRR unchanged THRR unchanged THRR unchanged     Progression   Progression -- -- Continue to progress workloads to maintain intensity without signs/symptoms of physical distress. Continue to progress workloads to maintain intensity without signs/symptoms of physical distress. Continue to progress workloads to maintain intensity without signs/symptoms of physical distress.   Average METs -- -- 3 2.47 2.23     Resistance Training   Training Prescription  -- -- Yes Yes Yes   Weight -- -- 3 lb 3 lb 3 lb   Reps -- -- 10-15 10-15 10-15     Interval Training   Interval Training -- -- No No No     NuStep   Level -- -- -- 4 --   Minutes -- -- -- 15 --   METs -- -- -- 1.4 --     Arm Ergometer   Level -- -- _1 Minutes -- -- _2 METs -- -- 2.55 1.6 2.6     Track   Laps -- -- 45 41 37   Minutes -- -- -- 15 15   METs -- -- 3.4 2.66 2.95     Home Exercise Plan   Plans to continue exercise at -- Home (comment)  walking Home (comment)  walking Home (comment)  walking Home (comment)  walking   Frequency -- Add 2  additional days to program exercise sessions. Add 2 additional days to program exercise sessions. Add 2 additional days to program exercise sessions. Add 2 additional days to program exercise sessions.   Initial Home Exercises Provided -- 12/21/20 12/21/20 12/21/20 12/21/20    Row Name 02/06/21 1400 02/20/21 1000 03/06/21 1500         Response to Exercise   Blood Pressure (Admit) 112/74 126/64 124/76     Blood Pressure (Exit) 102/58 132/70 128/70     Heart Rate (Admit) 90 bpm 85 bpm 83 bpm     Heart Rate (Exercise) 107 bpm 95 bpm 93 bpm     Heart Rate (Exit) 90 bpm 78 bpm 88 bpm     Oxygen Saturation (Admit) 90 % -- 98 %     Oxygen Saturation (Exercise) 98 % -- 94 %     Oxygen Saturation (Exit) 95 % -- 98 %     Rating of Perceived Exertion (Exercise) _0 Symptoms none none none     Duration Continue with 30 min of aerobic exercise without signs/symptoms of physical distress. Continue with 30 min of aerobic exercise without signs/symptoms of physical distress. Continue with 30 min of aerobic exercise without signs/symptoms of physical distress.     Intensity THRR unchanged THRR unchanged THRR unchanged           Progression       Progression Continue to progress workloads to maintain intensity without signs/symptoms of physical distress. Continue to progress workloads to maintain intensity without  signs/symptoms of physical distress. Continue to progress workloads to maintain intensity without signs/symptoms of physical distress.     Average METs 2.32 2.17 1.8           Resistance Training       Training Prescription Yes Yes Yes     Weight 3 lb 3 lb 3 lb     Reps 10-15 10-15 10-15           Interval Training       Interval Training No No No           Treadmill       MPH 2 -- --     Grade 1 -- --     Minutes 15 -- --     METs 2.81 -- --           NuStep       Level _1 Minutes _2 METs 2 -- --           Arm Ergometer       Level _3 Minutes _4 METs 2 1.8 1.8           Track       Laps _5 Minutes _6 METs 2.14 2.14 2.14           Home Exercise Plan       Plans to continue exercise at Home (comment)  walking Home (comment)  walking Home (comment)  walking     Frequency Add 2 additional days to program exercise sessions. Add 2 additional days to program exercise sessions. Add 2 additional days to program exercise sessions.     Initial Home Exercises Provided 12/21/20 12/21/20 12/21/20  Exercise Comments:   Exercise Comments     Row Name 12/14/20 1102           Exercise Comments First full day of exercise!  Patient was oriented to gym and equipment including functions, settings, policies, and procedures.  Patient's individual exercise prescription and treatment plan were reviewed.  All starting workloads were established based on the results of the 6 minute walk test done at initial orientation visit.  The plan for exercise progression was also introduced and progression will be customized based on patient's performance and goals.                Exercise Goals and Review:   Exercise Goals     Row Name 11/03/20 1315             Exercise Goals   Increase Physical Activity Yes       Intervention Provide advice, education, support and counseling about physical activity/exercise  needs.;Develop an individualized exercise prescription for aerobic and resistive training based on initial evaluation findings, risk stratification, comorbidities and participant's personal goals.       Expected Outcomes Short Term: Attend rehab on a regular basis to increase amount of physical activity.;Long Term: Add in home exercise to make exercise part of routine and to increase amount of physical activity.;Long Term: Exercising regularly at least 3-5 days a week.       Increase Strength and Stamina Yes       Intervention Provide advice, education, support and counseling about physical activity/exercise needs.;Develop an individualized exercise prescription for aerobic and resistive training based on initial evaluation findings, risk stratification, comorbidities and participant's personal goals.       Expected Outcomes Short Term: Increase workloads from initial exercise prescription for resistance, speed, and METs.;Long Term: Improve cardiorespiratory fitness, muscular endurance and strength as measured by increased METs and functional capacity (6MWT);Short Term: Perform resistance training exercises routinely during rehab and add in resistance training at home       Able to understand and use rate of perceived exertion (RPE) scale Yes       Intervention Provide education and explanation on how to use RPE scale       Expected Outcomes Short Term: Able to use RPE daily in rehab to express subjective intensity level;Long Term:  Able to use RPE to guide intensity level when exercising independently       Able to understand and use Dyspnea scale Yes       Intervention Provide education and explanation on how to use Dyspnea scale       Expected Outcomes Short Term: Able to use Dyspnea scale daily in rehab to express subjective sense of shortness of breath during exertion;Long Term: Able to use Dyspnea scale to guide intensity level when exercising independently       Knowledge and understanding of  Target Heart Rate Range (THRR) Yes       Intervention Provide education and explanation of THRR including how the numbers were predicted and where they are located for reference       Expected Outcomes Short Term: Able to state/look up THRR;Short Term: Able to use daily as guideline for intensity in rehab;Long Term: Able to use THRR to govern intensity when exercising independently       Able to check pulse independently Yes       Intervention Provide education and demonstration on how to check pulse in carotid and radial arteries.;Review the importance of being able to check your  own pulse for safety during independent exercise       Expected Outcomes Short Term: Able to explain why pulse checking is important during independent exercise;Long Term: Able to check pulse independently and accurately       Understanding of Exercise Prescription Yes       Intervention Provide education, explanation, and written materials on patient's individual exercise prescription       Expected Outcomes Short Term: Able to explain program exercise prescription;Long Term: Able to explain home exercise prescription to exercise independently                Exercise Goals Re-Evaluation :  Exercise Goals Re-Evaluation     Row Name 11/15/20 1610 12/14/20 1102 12/21/20 1125 12/26/20 1134 01/10/21 1614     Exercise Goal Re-Evaluation   Exercise Goals Review -- Increase Physical Activity;Understanding of Exercise Prescription;Knowledge and understanding of Target Heart Rate Range (THRR);Able to understand and use rate of perceived exertion (RPE) scale;Increase Strength and Stamina;Able to understand and use Dyspnea scale;Able to check pulse independently Increase Physical Activity;Understanding of Exercise Prescription;Knowledge and understanding of Target Heart Rate Range (THRR);Able to understand and use rate of perceived exertion (RPE) scale;Increase Strength and Stamina;Able to understand and use Dyspnea scale;Able  to check pulse independently Increase Physical Activity;Increase Strength and Stamina Increase Physical Activity;Increase Strength and Stamina;Understanding of Exercise Prescription   Comments Has yet to start exercise sessions due to new job Reviewed RPE and dyspnea scales, THR and program prescription with pt today.  Pt voiced understanding and was given a copy of goals to take home. Reviewed home exercise with pt today.  Pt plans to walk for exercise.   Reviewed THR, pulse, RPE, sign and symptoms, pulse oximetery and when to call 911 or MD.  Also discussed weather considerations and indoor options.  Pt voiced understanding. Bonnie Ochoa is doing well and walked 45 laps on the track!  The arm cracnk is hard for her.  Staff will monitor progress. Bonnie Ochoa continues to do well in rehab.  Her track laps very between 22-41.  We will encourage more consistency on speed and monitor her progress.   Expected Outcomes -- Short: Use RPE daily to regulate intensity. Long: Follow program prescription in THR. Short: Practice taking HR and/or purchase pulse ox and monitor HR during exercise Long: Follow appropriate exercise presciption at home Short: attend consistently Long:  build overall stamina Short: Walk at constant speed on track Long: Continue to improve stamina    Row Name 01/13/21 0958 01/25/21 0745 02/06/21 1012 02/20/21 1034 03/06/21 1540     Exercise Goal Re-Evaluation   Exercise Goals Review Increase Physical Activity;Increase Strength and Stamina Increase Physical Activity;Increase Strength and Stamina Increase Physical Activity;Increase Strength and Stamina Increase Physical Activity;Increase Strength and Stamina Increase Physical Activity;Increase Strength and Stamina   Comments Bonnie Ochoa is walking at home for exercise. She is considering joining a gym when she is done with the program. Informed her that walking at home is good but to make sure she is getting her heart rate up enough. Bonnie Ochoa is up to level 5 on Nustep.   She got 37 laps walking track.  She works at [employer] but doesnt reach THR. Bonnie Ochoa walks outside program sessions. We reviewed THR and how to check pulse. Bonnie Ochoa is continuing to do well in rehab. She is not quite hitting her THR and should  increase her pace and laps to help increase her HR. She is tolerating the T4 Nustep at level 5 pretty  well. Will continue to monitor. Bonnie Ochoa improved her post 6MWT by 19%.  She has missed the past two weeks due to transportation issues.  Hopefully she will be able to return to finish up.   Expected Outcomes Short: walk faster if it is to easy. Long: maintain walking at home post [facility]. Short: work toward [place]:  increase stamina Short:  practice checking HR during exercise Long: improve overall stamina Short: Increase numbers on track Long: Continue to increase overall MET level Short: Return to graudate Long: Continue to exericse at home            Discharge Exercise Prescription (Final Exercise Prescription Changes):  Exercise Prescription Changes - 03/06/21 1500       Response to Exercise   Blood Pressure (Admit) 124/76    Blood Pressure (Exit) 128/70    Heart Rate (Admit) 83 bpm    Heart Rate (Exercise) 93 bpm    Heart Rate (Exit) 88 bpm    Oxygen Saturation (Admit) 98 %    Oxygen Saturation (Exercise) 94 %    Oxygen Saturation (Exit) 98 %    Rating of Perceived Exertion (Exercise) 10    Symptoms none    Duration Continue with 30 min of aerobic exercise without signs/symptoms of physical distress.    Intensity THRR unchanged      Progression   Progression Continue to progress workloads to maintain intensity without signs/symptoms of physical distress.    Average METs 1.8      Resistance Training   Training Prescription Yes    Weight 3 lb    Reps 10-15      Interval Training   Interval Training No      NuStep   Level 4    Minutes 30      Arm Ergometer   Level 1    Minutes 15    METs 1.8      Track   Laps 21    Minutes 15     METs 2.14      Home Exercise Plan   Plans to continue exercise at Home (comment)   walking   Frequency Add 2 additional days to program exercise sessions.    Initial Home Exercises Provided 12/21/20             Nutrition:  Target Goals: Understanding of nutrition guidelines, daily intake of sodium <1558m, cholesterol <2068m calories 30% from fat and 7% or less from saturated fats, daily to have 5 or more servings of fruits and vegetables.  Education: All About Nutrition: -Group instruction provided by verbal, written material, interactive activities, discussions, models, and posters to present general guidelines for heart healthy nutrition including fat, fiber, MyPlate, the role of sodium in heart healthy nutrition, utilization of the nutrition label, and utilization of this knowledge for meal planning. Follow up email sent as well. Written material given at graduation. Flowsheet Row [facility]  Education need identified 11/03/20       Biometrics:  Pre Biometrics - 11/03/20 1315       Pre Biometrics   Height 5' 5" (1.651 m)    Weight 178 lb 3.2 oz (80.8 kg)    BMI (Calculated) 29.65    Single Leg Stand 10 seconds             Post Biometrics - 02/15/21 1149        Post  Biometrics   Height 5' 5" (1.651 m)  Weight 189 lb 8 oz (86 kg)    BMI (Calculated) 31.53    Single Leg Stand 3.3 seconds             Nutrition Therapy Plan and Nutrition Goals:  Nutrition Therapy & Goals - 12/28/20 [place]       Nutrition Therapy   RD appointment deferred Yes   Bonnie Ochoa would not like to talk to RD at this time - will continue to check in.            Nutrition Assessments:  MEDIFICTS Score Key: ?70 Need to make dietary changes  40-70 Heart Healthy Diet ? 40 Therapeutic Level Cholesterol Diet  Flowsheet Row Cardiac Rehab from 02/17/2021 in [facility] Cardiac and Pulmonary Rehab  Picture Your Plate Total Score on  Discharge 65      Picture Your Plate Scores: <81 Unhealthy dietary pattern with much room for improvement. 41-50 Dietary pattern unlikely to meet recommendations for good health and room for improvement. 51-60 More healthful dietary pattern, with some room for improvement.  >60 Healthy dietary pattern, although there may be some specific behaviors that could be improved.    Nutrition Goals Re-Evaluation:  Nutrition Goals Re-Evaluation     [place] Name 12/21/20 1144 01/13/21 1002 02/06/21 1011         Goals   Current Weight -- 185 lb (83.9 kg) --     Nutrition Goal -- Meet with the dietician. Has not met with RD     Comment Bonnie Ochoa has not met with Lenna Sciara, RD yet to establish nutrition goals. She did not meet yet as she missed her appt and will reschedule to get a new one. Patient states that she ate a breakfast sandwich this morning from a fast fookd restaruant. She has a sandwich and chips for lunch. She will eat steak, chicken and some vegetables. --     Expected Outcome Short: Get appointment with RD Long: Maintain heart healthy diet Short: meet with the dietician. Long: adhere to a diet that pertains to her needs. Short: meet with RD Long: develop heart healthy diet              Nutrition Goals Discharge (Final Nutrition Goals Re-Evaluation):  Nutrition Goals Re-Evaluation - 02/06/21 1011       Goals   Nutrition Goal Has not met with RD    Expected Outcome Short: meet with RD Long: develop heart healthy diet             Psychosocial: Target Goals: Acknowledge presence or absence of significant depression and/or stress, maximize coping skills, provide positive support system. Participant is able to verbalize types and ability to use techniques and skills needed for reducing stress and depression.   Education: Stress, Anxiety, and Depression - Group verbal and visual presentation to define topics covered.  Reviews how body is impacted by stress, anxiety, and depression.   Also discusses healthy ways to reduce stress and to treat/manage anxiety and depression.  Written material given at graduation. Flowsheet Row Cardiac Rehab from 02/08/2021 in [facility] Cardiac and Pulmonary Rehab  Education need identified 11/03/20       Education: Sleep Hygiene -Provides group verbal and written instruction about how sleep can affect your health.  Define sleep hygiene, discuss sleep cycles and impact of sleep habits. Review good sleep hygiene tips.    Initial Review & Psychosocial Screening:  Initial Psych Review & Screening - 10/26/20 1048       Initial Review   Current issues  with Current Depression;Current Psychotropic Meds      Family Dynamics   Good Support System? Yes   wife Darryll Capers, sister, children live in [place]     Barriers   Psychosocial barriers to participate in program There are no identifiable barriers or psychosocial needs.      Screening Interventions   Interventions Encouraged to exercise;To provide support and resources with identified psychosocial needs;Provide feedback about the scores to participant    Expected Outcomes Short Term goal: Utilizing psychosocial counselor, staff and physician to assist with identification of specific Stressors or current issues interfering with healing process. Setting desired goal for each stressor or current issue identified.;Long Term Goal: Stressors or current issues are controlled or eliminated.;Short Term goal: Identification and review with participant of any Quality of Life or Depression concerns found by scoring the questionnaire.;Long Term goal: The participant improves quality of Life and PHQ9 Scores as seen by post scores and/or verbalization of changes             Quality of Life Scores:   Quality of Life - 02/17/21 1003       Quality of Life   Select Quality of Life      Quality of Life Scores   Health/Function Pre 17.5 %    Health/Function Post 18.92 %    Health/Function % Change 8.11 %     Socioeconomic Pre 18.56 %    Socioeconomic Post 24.58 %    Socioeconomic % Change  32.44 %    Psych/Spiritual Pre 25.14 %    Psych/Spiritual Post 25 %    Psych/Spiritual % Change -0.56 %    Family Pre 25.2 %    Family Post 27.6 %    Family % Change 9.52 %    GLOBAL Pre 20.37 %    GLOBAL Post 22.72 %    GLOBAL % Change 11.54 %            Scores of 19 and below usually indicate a poorer quality of life in these areas.  A difference of  2-3 points is a clinically meaningful difference.  A difference of 2-3 points in the total score of the Quality of Life Index has been associated with significant improvement in overall quality of life, self-image, physical symptoms, and general health in studies assessing change in quality of life.  PHQ-9: Recent Review Flowsheet Data     Depression screen [facility] 2/9 02/17/2021 01/10/2021 11/03/2020 09/09/2020 07/11/2020   Decreased Interest 3 1 0 0 2   Down, Depressed, Hopeless _0 0 1   PHQ - 2 Score _1 0 3   Altered sleeping 3 0 0 - 0   Tired, decreased energy _2 - 3   Change in appetite 1 0 0 - 0   Feeling bad or failure about yourself  _3 - 0   Trouble concentrating _4 - 0   Moving slowly or fidgety/restless 0 3 0 - 0   Suicidal thoughts 1 0 0 - 0   PHQ-9 Score _5 - 6   Difficult doing work/chores Somewhat difficult Not difficult at all Not difficult at all - -      Interpretation of Total Score  Total Score Depression Severity:  1-4 = Minimal depression, 5-9 = Mild depression, 10-14 = Moderate depression, 15-19 = Moderately severe depression, 20-27 = Severe depression   Psychosocial Evaluation and Intervention:  Psychosocial Evaluation - 10/26/20 1111  Psychosocial Evaluation & Interventions   Interventions Encouraged to exercise with the program and follow exercise prescription    Comments Bonnie Ochoa has no barriers to starting the program. She has history of depression and is under the care of physician and has Paxil  prscribed. She moved to [place] in the past 5 years with he sister. SHe is married for 3 years to her wife LaTricia.  She has grown children that live in [place]. Her wife aand sister are her support system. THey all live together in a home with no pets. Her goal is to increase her energy. She should do well in the program.    Expected Outcomes STG: Bonnie Ochoa attends all scheduled sessions, and is able to progress her exercise prescription over time. LTG: she has gained more energy/stamina and is able to maintain this with continuing what she learned in the program. [facility] keeps her depression under control with continued meds and counseling    Continue Psychosocial Services  Follow up required by staff             Psychosocial Re-Evaluation:  Psychosocial Re-Evaluation     [place] Name 12/21/20 1140 01/13/21 1007 02/06/21 1009         Psychosocial Re-Evaluation   Current issues with Current Depression;Current Psychotropic Meds Current Depression;History of Depression;Current Psychotropic Meds Current Depression;History of Depression;Current Psychotropic Meds     Comments Bonnie Ochoa reports that she is doing well. She continues to have ongoing support from her wife and kids. She is currently on medication for depression and is in the process of switching providers for a therapist. She reports the medication is helping and does not think she needs any other intervention at this time. Reports that sleep is good and has no complaints. Bonnie Ochoa feels like she is depressed since her mother died in 10-04-12. She has no other concernes since last follow up. Bonnie Ochoa takes meds as directed for depression.  She feels exercise has helped some.  She sleeps well and has no other concerns     Expected Outcomes Short: Establish new provider for therapy Long: Utilize exercise for stress management and maintain positive attitude Short: Continue to exercise regularly to support mental health and notify staff of any changes. Long: maintain mental  health and well being through teaching of rehab or prescribed medications independently. Short: continue to exercise and take meds as directed Long:  attend relaxation education for review on breathing techniques     Interventions Encouraged to attend Cardiac Rehabilitation for the exercise Encouraged to attend Cardiac Rehabilitation for the exercise --     Continue Psychosocial Services  Follow up required by staff Follow up required by staff --              Psychosocial Discharge (Final Psychosocial Re-Evaluation):  Psychosocial Re-Evaluation - 02/06/21 1009       Psychosocial Re-Evaluation   Current issues with Current Depression;History of Depression;Current Psychotropic Meds    Comments Bonnie Ochoa takes meds as directed for depression.  She feels exercise has helped some.  She sleeps well and has no other concerns    Expected Outcomes Short: continue to exercise and take meds as directed Long:  attend relaxation education for review on breathing techniques             Vocational Rehabilitation: Provide vocational rehab assistance to qualifying candidates.   Vocational Rehab Evaluation & Intervention:  Vocational Rehab - 10/26/20 1053       Initial Vocational Rehab Evaluation & Intervention  Assessment shows need for Vocational Rehabilitation No             Education: Education Goals: Education classes will be provided on a variety of topics geared toward better understanding of heart health and risk factor modification. Participant will state understanding/return demonstration of topics presented as noted by education test scores.  Learning Barriers/Preferences:  Learning Barriers/Preferences - 10/26/20 1053       Learning Barriers/Preferences   Learning Barriers None    Learning Preferences None             General Cardiac Education Topics:  AED/CPR: - Group verbal and written instruction with the use of models to demonstrate the basic use of the AED with  the basic ABC's of resuscitation.   Anatomy and Cardiac Procedures: - Group verbal and visual presentation and models provide information about basic cardiac anatomy and function. Reviews the testing methods done to diagnose heart disease and the outcomes of the test results. Describes the treatment choices: Medical Management, Angioplasty, or Coronary Bypass Surgery for treating various heart conditions including Myocardial Infarction, Angina, Valve Disease, and Cardiac Arrhythmias.  Written material given at graduation. Flowsheet Row Cardiac Rehab from 02/08/2021 in [facility] Cardiac and Pulmonary Rehab  Education need identified 11/03/20  Date 02/08/21  Educator SB  Instruction Review Code 1- Verbalizes Understanding       Medication Safety: - Group verbal and visual instruction to review commonly prescribed medications for heart and lung disease. Reviews the medication, class of the drug, and side effects. Includes the steps to properly store meds and maintain the prescription regimen.  Written material given at graduation. Flowsheet Row Cardiac Rehab from 02/08/2021 in [facility] Cardiac and Pulmonary Rehab  Date 12/28/20  Educator SB  Instruction Review Code 1- Verbalizes Understanding       Intimacy: - Group verbal instruction through game format to discuss how heart and lung disease can affect sexual intimacy. Written material given at graduation.. Flowsheet Row Cardiac Rehab from 02/08/2021 in [facility] Cardiac and Pulmonary Rehab  Date 02/01/21  Educator [facility]  Instruction Review Code 1- Verbalizes Understanding       Know Your Numbers and Heart Failure: - Group verbal and visual instruction to discuss disease risk factors for cardiac and pulmonary disease and treatment options.  Reviews associated critical values for Overweight/Obesity, Hypertension, Cholesterol, and Diabetes.  Discusses basics of heart failure: signs/symptoms and treatments.  Introduces Heart Failure Zone chart for action  plan for heart failure.  Written material given at graduation. Flowsheet Row Cardiac Rehab from 02/08/2021 in [facility] Cardiac and Pulmonary Rehab  Date 01/04/21  Educator [facility]  Instruction Review Code 1- Verbalizes Understanding       Infection Prevention: - Provides verbal and written material to individual with discussion of infection control including proper hand washing and proper equipment cleaning during exercise session. Flowsheet Row Cardiac Rehab from 02/08/2021 in [facility] Cardiac and Pulmonary Rehab  Date 11/03/20  Educator [facility]  Instruction Review Code 1- Verbalizes Understanding       Falls Prevention: - Provides verbal and written material to individual with discussion of falls prevention and safety. Flowsheet Row Cardiac Rehab from 02/08/2021 in [facility] Cardiac and Pulmonary Rehab  Date 10/26/20  Educator SB  Instruction Review Code 1- Verbalizes Understanding       Other: -Provides group and verbal instruction on various topics (see comments)   Knowledge Questionnaire Score:  Knowledge Questionnaire Score - 02/17/21 1003       Knowledge Questionnaire Score  Post Score 18/26             Core Components/Risk Factors/Patient Goals at Admission:  Personal Goals and Risk Factors at Admission - 11/03/20 1317       Core Components/Risk Factors/Patient Goals on Admission    Weight Management Yes;Weight Maintenance    Intervention Weight Management: Develop a combined nutrition and exercise program designed to reach desired caloric intake, while maintaining appropriate intake of nutrient and fiber, sodium and fats, and appropriate energy expenditure required for the weight goal.;Weight Management: Provide education and appropriate resources to help participant work on and attain dietary goals.;Obesity: Provide education and appropriate resources to help participant work on and attain dietary goals.    Admit Weight 178 lb 3.2 oz (80.8 kg)    Goal Weight: Short Term 173 lb  (78.5 kg)    Goal Weight: Long Term 165 lb (74.8 kg)    Expected Outcomes Short Term: Continue to assess and modify interventions until short term weight is achieved;Long Term: Adherence to nutrition and physical activity/exercise program aimed toward attainment of established weight goal;Weight Loss: Understanding of general recommendations for a balanced deficit meal plan, which promotes 1-2 lb weight loss per week and includes a negative energy balance of [phone] kcal/d;Understanding recommendations for meals to include 15-35% energy as protein, 25-35% energy from fat, 35-60% energy from carbohydrates, less than 252m of dietary cholesterol, 20-35 gm of total fiber daily;Understanding of distribution of calorie intake throughout the day with the consumption of 4-5 meals/snacks    Diabetes Yes    Intervention Provide education about signs/symptoms and action to take for hypo/hyperglycemia.;Provide education about proper nutrition, including hydration, and aerobic/resistive exercise prescription along with prescribed medications to achieve blood glucose in normal ranges: Fasting glucose 65-99 mg/dL    Expected Outcomes Short Term: Participant verbalizes understanding of the signs/symptoms and immediate care of hyper/hypoglycemia, proper foot care and importance of medication, aerobic/resistive exercise and nutrition plan for blood glucose control.;Long Term: Attainment of HbA1C < 7%.    Heart Failure Yes    Intervention Provide a combined exercise and nutrition program that is supplemented with education, support and counseling about heart failure. Directed toward relieving symptoms such as shortness of breath, decreased exercise tolerance, and extremity edema.    Expected Outcomes Improve functional capacity of life;Short term: Attendance in program 2-3 days a week with increased exercise capacity. Reported lower sodium intake. Reported increased fruit and vegetable intake. Reports medication  compliance.;Short term: Daily weights obtained and reported for increase. Utilizing diuretic protocols set by physician.;Long term: Adoption of self-care skills and reduction of barriers for early signs and symptoms recognition and intervention leading to self-care maintenance.    Hypertension Yes    Intervention Provide education on lifestyle modifcations including regular physical activity/exercise, weight management, moderate sodium restriction and increased consumption of fresh fruit, vegetables, and low fat dairy, alcohol moderation, and smoking cessation.;Monitor prescription use compliance.    Expected Outcomes Short Term: Continued assessment and intervention until BP is < 140/931mHG in hypertensive participants. < 130/8068mG in hypertensive participants with diabetes, heart failure or chronic kidney disease.;Long Term: Maintenance of blood pressure at goal levels.    Lipids Yes    Intervention Provide education and support for participant on nutrition & aerobic/resistive exercise along with prescribed medications to achieve LDL <48m74mDL >40mg52m Expected Outcomes Short Term: Participant states understanding of desired cholesterol values and is compliant with medications prescribed. Participant is following exercise prescription and nutrition guidelines.;Long Term: Cholesterol  controlled with medications as prescribed, with individualized exercise RX and with personalized nutrition plan. Value goals: LDL < 91m, HDL > 40 mg.             Education:Diabetes - Individual verbal and written instruction to review signs/symptoms of diabetes, desired ranges of glucose level fasting, after meals and with exercise. Acknowledge that pre and post exercise glucose checks will be done for 3 sessions at entry of program.   Core Components/Risk Factors/Patient Goals Review:   Goals and Risk Factor Review     Row Name 12/21/20 1133 01/13/21 1001 02/06/21 1006         Core Components/Risk  Factors/Patient Goals Review   Personal Goals Review Weight Management/Obesity;Heart Failure;Hypertension;Diabetes Weight Management/Obesity;Diabetes Weight Management/Obesity;Diabetes     Review Weight has been stable, she does weigh her self at home and is aware to report and look for any sudden weight gain. At this time, she denies any symptoms for heart failure. We did discuss a hand out for heart failure, symptoms to look out for and has a take home handout to reference to. She ran out of test strips but normally checks her sugars twice/day. Her doctor wants it around 100 and reports her sugars around 90-low 100s. Her A1C trend has decreased and applauded her to continue lowering that number. I did stress importance of checking sugars, especially if ever symptomatic, and encouraged her to get new test strips as soon as possible. She does not check her BP and was also encouraged to get a cuff for home use. -- JLuvenia Starchreports her weight has been going up and down.  She reports no symptoms of heart failure.  Her FBG has been around 86.  She doesnt have a BP cuff yet.     Expected Outcomes Short: Obtain more test strips and home BP cuff Long: Continue to manage lifestyle risk factors -- Short:  get BP cuff for home Long: continue to manage risk factors              Core Components/Risk Factors/Patient Goals at Discharge (Final Review):   Goals and Risk Factor Review - 02/06/21 1006       Core Components/Risk Factors/Patient Goals Review   Personal Goals Review Weight Management/Obesity;Diabetes    Review JLuvenia Starchreports her weight has been going up and down.  She reports no symptoms of heart failure.  Her FBG has been around 86.  She doesnt have a BP cuff yet.    Expected Outcomes Short:  get BP cuff for home Long: continue to manage risk factors             ITP Comments:  ITP Comments     Row Name 10/26/20 1110 11/03/20 1310 11/04/20 0830 11/15/20 1610 11/30/20 0622   ITP Comments Virtual  orientation call completed today. shehas an appointment on Date: 11/03/2020 for EP eval and gym Orientation.  [facility]. Completed 6MWT and gym orientation. Initial ITP created and sent for review to Dr. MEmily Filbert Medical Director. JLuvenia Starchcalled to let uKoreaknow that she has started a new job and will be working from [place].  Her current time slot of 945 will not work with her new scheduled.  She would like to wait two weeks before starting rehab to get used to her job and to see which time would work best for her.  We will touch base again in two weeks. Waiting to hear  back from new job info. 30 Day review completed. Medical Director ITP review done, changes made as directed, and signed approval by Medical Director.No visits in April    Row Name 12/14/20 1102 12/28/20 0746 01/25/21 0851 02/22/21 1124 03/06/21 1540   ITP Comments First full day of exercise!  Patient was oriented to gym and equipment including functions, settings, policies, and procedures.  Patient's individual exercise prescription and treatment plan were reviewed.  All starting workloads were established based on the results of the 6 minute walk test done at initial orientation visit.  The plan for exercise progression was also introduced and progression will be customized based on patient's performance and goals. 30 Day review completed. Medical Director ITP review done, changes made as directed, and signed approval by Medical Director.  New to program 30 Day review completed. Medical Director ITP review done, changes made as directed, and signed approval by Medical Director. 30 Day review completed. Medical Director ITP review done, changes made as directed, and signed approval by Medical Director. Bonnie Ochoa has been out since 7/11 with transportation issues.  She was given contact for [facility]. She only has 4 visits left.    [place] Name 03/09/21 1007 03/22/21 0756         ITP Comments  Called Bonnie Ochoa to see if she would like to graduate at 79 visits as she only has four left. LM. 30 Day review completed. Medical Director ITP review done, changes made as directed, and signed approval by Medical Director.               Comments:

## 2021-03-24 ENCOUNTER — Ambulatory Visit: Payer: Medicare Other

## 2021-03-27 ENCOUNTER — Ambulatory Visit: Payer: Medicare Other

## 2021-03-29 ENCOUNTER — Ambulatory Visit: Payer: Medicare Other

## 2021-03-31 ENCOUNTER — Ambulatory Visit: Payer: Medicare Other

## 2021-04-02 ENCOUNTER — Other Ambulatory Visit: Payer: Self-pay | Admitting: Psychiatry

## 2021-04-02 DIAGNOSIS — F5105 Insomnia due to other mental disorder: Secondary | ICD-10-CM

## 2021-04-02 DIAGNOSIS — F410 Panic disorder [episodic paroxysmal anxiety] without agoraphobia: Secondary | ICD-10-CM

## 2021-04-02 DIAGNOSIS — F3131 Bipolar disorder, current episode depressed, mild: Secondary | ICD-10-CM

## 2021-04-02 DIAGNOSIS — F4312 Post-traumatic stress disorder, chronic: Secondary | ICD-10-CM

## 2021-04-03 ENCOUNTER — Ambulatory Visit: Payer: Medicare Other

## 2021-04-03 DIAGNOSIS — I5022 Chronic systolic (congestive) heart failure: Secondary | ICD-10-CM

## 2021-04-03 NOTE — Progress Notes (Signed)
Cardiac Individual Treatment Plan  Patient Details  Name: Bonnie Ochoa MRN: [id number] Date of Birth: Jan 19, 1966 Referring Provider:   Flowsheet Row Cardiac Rehab from 11/03/2020 in [facility] Cardiac and Pulmonary Rehab  Referring Provider Glori Bickers MD  [facility] Cardiologist: Dr. Ida Rogue       Initial Encounter Date:  Flowsheet Row Cardiac Rehab from 11/03/2020 in [facility] Cardiac and Pulmonary Rehab  Date 11/03/20       Visit Diagnosis: Chronic systolic heart failure ([place])  Patient's Home Medications on Admission:  Current Outpatient Medications:    blood glucose meter kit and supplies KIT, Dispense based on patient and insurance preference. Use up to four times daily as directed. (FOR ICD-9 250.00, 250.01)., Disp: 1 each, Rfl: 0   carvedilol (COREG) 12.5 MG tablet, Take 1 tablet (12.5 mg total) by mouth 2 (two) times daily with a meal., Disp: 180 tablet, Rfl: 3   cyclobenzaprine (FLEXERIL) 5 MG tablet, TAKE 1 TABLET BY MOUTH THREE TIMES A DAY AS NEEDED FOR MUSCLE SPASMS, Disp: 60 tablet, Rfl: 1   digoxin (LANOXIN) 0.125 MG tablet, TAKE 1 TABLET BY MOUTH DAILY, Disp: 90 tablet, Rfl: 3   famotidine (PEPCID) 40 MG tablet, TAKE 1 TABLET BY MOUTH EVERY DAY IN THE EVENING, Disp: 90 tablet, Rfl: 0   furosemide (LASIX) 20 MG tablet, Take 1 tablet (20 mg total) by mouth as directed. Take 18m (1 tablet) Monday, Wednesday, Friday., Disp: 40 tablet, Rfl: 3   Insulin Pen Needle 32G X 6 MM MISC, 1 each by Does not apply route daily., Disp: 100 each, Rfl: 12   JARDIANCE 25 MG TABS tablet, TAKE 1 TABLET BY MOUTH EVERY DAY, Disp: 90 tablet, Rfl: 1   lamoTRIgine (LAMICTAL) 150 MG tablet, TAKE 1 TABLET BY MOUTH EVERY DAY, Disp: 30 tablet, Rfl: 0   LANTUS SOLOSTAR 100 UNIT/ML Solostar Pen, INJECT 35 UNITS INTO THE SKIN DAILY AT 10 PM., Disp: 15 mL, Rfl: 0   metoCLOPramide (REGLAN) 10 MG tablet, Take 10 mg by mouth 4 (four) times daily., Disp: , Rfl:    pantoprazole (PROTONIX) 40 MG tablet,  TAKE 1 TABLET BY MOUTH EVERY DAY, Disp: 90 tablet, Rfl: 1   PARoxetine (PAXIL) 30 MG tablet, Take 1 tablet (30 mg total) by mouth daily., Disp: 90 tablet, Rfl: 1   potassium chloride SA (KLOR-CON M20) 20 MEQ tablet, TAKE 2 TABLETS (40 MEQ TOTAL) BY MOUTH 2 (TWO) TIMES DAILY., Disp: 180 tablet, Rfl: 3   risperiDONE (RISPERDAL) 0.25 MG tablet, TAKE 1 TABLET (0.25 MG TOTAL) BY MOUTH EVERY MORNING., Disp: 90 tablet, Rfl: 1   rosuvastatin (CRESTOR) 40 MG tablet, Take 1 tablet (40 mg total) by mouth daily., Disp: 90 tablet, Rfl: 3   sacubitril-valsartan (ENTRESTO) 97-103 MG, Take 1 tablet by mouth 2 (two) times daily., Disp: 60 tablet, Rfl: 11   spironolactone (ALDACTONE) 25 MG tablet, Take 1 tablet (25 mg total) by mouth daily., Disp: 90 tablet, Rfl: 3   sucralfate (CARAFATE) 1 g tablet, TAKE 1 TABLET (1 G TOTAL) BY MOUTH 4 (FOUR) TIMES DAILY., Disp: 360 tablet, Rfl: 3   zolpidem (AMBIEN) 10 MG tablet, TAKE 1 TABLET BY MOUTH AT BEDTIME AS NEEDED FOR SLEEP., Disp: 21 tablet, Rfl: 0  Past Medical History: Past Medical History:  Diagnosis Date   Abdominal pain    a. 05/2018 HIDA scan wnl.   ADHD    AICD (automatic cardioverter/defibrillator) present    a. 11/2014 s/p SJM Fortify Assura, single lead AICD (ser# [id number].  Anemia    Arthritis    Asthma    Bipolar 1 disorder ([place])    Chest pain    a. Hx of cath in [place] - reportedly nl; b. 04/2018 MV: EF 22%, fixed dist ant septal, apical, and inferoapical defects - ? scar vs. attenuation. No ischemia.   CHF (congestive heart failure) (HCC)    Cirrhosis of liver ([place])    Coronary artery disease    Depression    Diabetes mellitus without complication ([place])    Diverticulitis    Dysrhythmia    Heart murmur    HFrEF (heart failure with reduced ejection fraction) ([place])    a. 06/2017 Echo: EF 20-25%, diff HK. Mildly dil LA; b. 05/2018 Echo: EF 25-30%, diff HK, Gr2 DD. Triv AI. Mod MR. Mildly reduced RV fxn. Mod-Sev TR. PASP 45-25mHg.   Hypertension     Hypothyroidism    IBS (irritable bowel syndrome)    Insomnia    Migraines    NICM (nonischemic cardiomyopathy) ([place]    a. EF prev 25%; b. 11/2014 s/p SJM Fortify Assura, single lead AICD (ser# [id number]; c. 06/2017 Echo: EF 20-25%; d. 05/2018 Echo: EF 25-30%, diff HK, Gr2 DD; e. 05/2018 Echo: EF 25-30%; f. 05/2018 Cath: Nl cors. LVEDP 286mg, PCWP 3223m. PA 65/40 (52). CO/CI 3.04/1.56; c. 07/2018 CPX: Mod HF limitation.   OSA (obstructive sleep apnea)    CPAP is broken   PTSD (post-traumatic stress disorder)    Restless leg syndrome    Vertigo     Tobacco Use: Social History   Tobacco Use  Smoking Status Former   Types: Cigarettes   Quit date: 1985   Years since quitting: 37.6  Smokeless Tobacco Never  Tobacco Comments   quit over  20 years ago     Labs: Recent RevChemical engineer  Labs for ITP Cardiac and Pulmonary Rehab Latest Ref Rng & Units 09/28/2019 12/25/2019 04/06/2020 07/11/2020 01/10/2021   Cholestrol 100 - 199 mg/dL 214(H) - - 152 188   LDLCALC 0 - 99 mg/dL 121(H) - - 84 93   HDL >39 mg/dL 36(L) - - 39(L) 35(L)   Trlycerides 0 - 149 mg/dL 321(H) - - 168(H) 359(H)   Hemoglobin A1c <7.0 % 9.9(H) 10.1(H) 8.5(H) 6.8 8.7(H)        Exercise Target Goals: Exercise Program Goal: Individual exercise prescription set using results from initial 6 min walk test and THRR while considering  patient's activity barriers and safety.   Exercise Prescription Goal: Initial exercise prescription builds to 30-45 minutes a day of aerobic activity, 2-3 days per week.  Home exercise guidelines will be given to patient during program as part of exercise prescription that the participant will acknowledge.   Education: Aerobic Exercise: - Group verbal and visual presentation on the components of exercise prescription. Introduces F.I.T.T principle from ACSM for exercise prescriptions.  Reviews F.I.T.T. principles of aerobic exercise including progression. Written material given at  graduation. Flowsheet [facility] [place]  Education need identified 11/03/20  Date 02/01/21  Educator [facility] Code 1- Verbalizes Understanding       Education: Resistance Exercise: - Group verbal and visual presentation on the components of exercise prescription. Introduces F.I.T.T principle from ACSM for exercise prescriptions  Reviews F.I.T.T. principles of resistance exercise including progression. Written material given at graduation. Flowsheet [facility]  Date 02/08/21  Educator [facility]  Instruction Review Code 1- Verbalizes Understanding        Education: Exercise & Equipment Safety: - Individual verbal instruction and demonstration of equipment use and safety with use of the equipment. Flowsheet Row Cardiac Rehab from 02/08/2021 in [facility] Cardiac and Pulmonary Rehab  Date 11/03/20  Educator [facility]  Instruction Review Code 1- Verbalizes Understanding       Education: Exercise Physiology & General Exercise Guidelines: - Group verbal and written instruction with models to review the exercise physiology of the cardiovascular system and associated critical values. Provides general exercise guidelines with specific guidelines to those with heart or lung disease.    Education: Flexibility, Balance, Mind/Body Relaxation: - Group verbal and visual presentation with interactive activity on the components of exercise prescription. Introduces F.I.T.T principle from ACSM for exercise prescriptions. Reviews F.I.T.T. principles of flexibility and balance exercise training including progression. Also discusses the mind body connection.  Reviews various relaxation techniques to help reduce and manage stress (i.e. Deep breathing, progressive muscle relaxation, and visualization). Balance handout provided to take home. Written material given at graduation. Flowsheet Row Cardiac Rehab  from 02/08/2021 in [facility] Cardiac and Pulmonary Rehab  Date 12/14/20  Educator AS  Instruction Review Code 1- Verbalizes Understanding       Activity Barriers & Risk Stratification:  Activity Barriers & Cardiac Risk Stratification - 11/03/20 1312       Activity Barriers & Cardiac Risk Stratification   Activity Barriers Chest Pain/Angina;Back Problems;Muscular Weakness;Deconditioning;Balance Concerns    Cardiac Risk Stratification High             6 Minute Walk:  6 Minute Walk     Row Name 11/03/20 1311 02/15/21 1149       6 Minute Walk   Phase Initial Discharge    Distance 1095 feet 1305 feet    Distance % Change -- 19.2 %    Distance Feet Change -- 210 ft    Walk Time 6 minutes 6 minutes    # of Rest Breaks 0 0    MPH 2.07 2.47    METS 3.1 3.39    RPE 13 12    VO2 Peak 10.86 11.88    Symptoms Yes (comment) No    Comments R thigh pain 4/10 --    Resting HR 59 bpm 88 bpm    Resting BP 142/84 122/76    Resting Oxygen Saturation  97 % 97 %    Exercise Oxygen Saturation  during 6 min walk 99 % 98 %    Max Ex. HR 81 bpm 106 bpm    Max Ex. BP 154/86 128/72    2 Minute Post BP 138/80 --             Oxygen Initial Assessment:   Oxygen Re-Evaluation:   Oxygen Discharge (Final Oxygen Re-Evaluation):   Initial Exercise Prescription:  Initial Exercise Prescription - 11/03/20 1300       Date of Initial Exercise RX and Referring Provider   Date 11/03/20    Referring Provider Glori Bickers MD   Primary Cardiologist: Dr. Ida Rogue     Treadmill   MPH 2    Grade 1    Minutes 15    METs 2.81      Recumbant Bike   Level 2    RPM 50    Watts 28    Minutes 15    METs 2      Arm Ergometer   Level 1    Watts 25  RPM 25    Minutes 15    METs 2      REL-XR   Level 3    Speed 50    Minutes 15    METs 2      Prescription Details   Frequency (times per week) 3    Duration Progress to 30 minutes of continuous aerobic without  signs/symptoms of physical distress      Intensity   THRR 40-80% of Max Heartrate 101-143    Ratings of Perceived Exertion 11-13    Perceived Dyspnea 0-4      Progression   Progression Continue to progress workloads to maintain intensity without signs/symptoms of physical distress.      Resistance Training   Training Prescription Yes    Weight 3 lb    Reps 10-15             Perform Capillary Blood Glucose checks as needed.  Exercise Prescription Changes:   Exercise Prescription Changes     Row Name 11/03/20 1300 12/21/20 1100 12/26/20 1100 01/10/21 1600 01/25/21 0700     Response to Exercise   Blood Pressure (Admit) 142/84 -- 122/72 114/70 118/62   Blood Pressure (Exercise) 154/86 -- 118/70 142/78 128/64   Blood Pressure (Exit) 138/80 -- 108/62 108/64 102/62   Heart Rate (Admit) 59 bpm -- 85 bpm 78 bpm 66 bpm   Heart Rate (Exercise) 81 bpm -- 88 bpm 105 bpm 75 bpm   Heart Rate (Exit) 70 bpm -- 85 bpm 85 bpm 75 bpm   Oxygen Saturation (Admit) 97 % -- -- -- --   Oxygen Saturation (Exercise) 99 % -- -- -- --   Rating of Perceived Exertion (Exercise) 13 -- _0 Symptoms r thigh pain 4/10 -- none none none   Comments walk test results -- -- -- --   Duration -- -- Progress to 30 minutes of  aerobic without signs/symptoms of physical distress Continue with 30 min of aerobic exercise without signs/symptoms of physical distress. Progress to 30 minutes of  aerobic without signs/symptoms of physical distress   Intensity -- -- THRR unchanged THRR unchanged THRR unchanged     Progression   Progression -- -- Continue to progress workloads to maintain intensity without signs/symptoms of physical distress. Continue to progress workloads to maintain intensity without signs/symptoms of physical distress. Continue to progress workloads to maintain intensity without signs/symptoms of physical distress.   Average METs -- -- 3 2.47 2.23     Resistance Training   Training Prescription  -- -- Yes Yes Yes   Weight -- -- 3 lb 3 lb 3 lb   Reps -- -- 10-15 10-15 10-15     Interval Training   Interval Training -- -- No No No     NuStep   Level -- -- -- 4 --   Minutes -- -- -- 15 --   METs -- -- -- 1.4 --     Arm Ergometer   Level -- -- _1 Minutes -- -- _2 METs -- -- 2.55 1.6 2.6     Track   Laps -- -- 45 41 37   Minutes -- -- -- 15 15   METs -- -- 3.4 2.66 2.95     Home Exercise Plan   Plans to continue exercise at -- Home (comment)  walking Home (comment)  walking Home (comment)  walking Home (comment)  walking   Frequency -- Add 2  additional days to program exercise sessions. Add 2 additional days to program exercise sessions. Add 2 additional days to program exercise sessions. Add 2 additional days to program exercise sessions.   Initial Home Exercises Provided -- 12/21/20 12/21/20 12/21/20 12/21/20    Row Name 02/06/21 1400 02/20/21 1000 03/06/21 1500         Response to Exercise   Blood Pressure (Admit) 112/74 126/64 124/76     Blood Pressure (Exit) 102/58 132/70 128/70     Heart Rate (Admit) 90 bpm 85 bpm 83 bpm     Heart Rate (Exercise) 107 bpm 95 bpm 93 bpm     Heart Rate (Exit) 90 bpm 78 bpm 88 bpm     Oxygen Saturation (Admit) 90 % -- 98 %     Oxygen Saturation (Exercise) 98 % -- 94 %     Oxygen Saturation (Exit) 95 % -- 98 %     Rating of Perceived Exertion (Exercise) 13 11 10      Symptoms none none none     Duration Continue with 30 min of aerobic exercise without signs/symptoms of physical distress. Continue with 30 min of aerobic exercise without signs/symptoms of physical distress. Continue with 30 min of aerobic exercise without signs/symptoms of physical distress.     Intensity THRR unchanged THRR unchanged THRR unchanged           Progression   Progression Continue to progress workloads to maintain intensity without signs/symptoms of physical distress. Continue to progress workloads to maintain intensity without signs/symptoms  of physical distress. Continue to progress workloads to maintain intensity without signs/symptoms of physical distress.     Average METs 2.32 2.17 1.8           Resistance Training   Training Prescription Yes Yes Yes     Weight 3 lb 3 lb 3 lb     Reps 10-15 10-15 10-15           Interval Training   Interval Training No No No           Treadmill   MPH 2 -- --     Grade 1 -- --     Minutes 15 -- --     METs 2.81 -- --           NuStep   Level 5 5 4      Minutes 15 15 30      METs 2 -- --           Arm Ergometer   Level 1 1 1      Minutes 15 15 15      METs 2 1.8 1.8           Track   Laps 21 21 21      Minutes 15 15 15      METs 2.14 2.14 2.14           Home Exercise Plan   Plans to continue exercise at Home (comment)  walking Home (comment)  walking Home (comment)  walking     Frequency Add 2 additional days to program exercise sessions. Add 2 additional days to program exercise sessions. Add 2 additional days to program exercise sessions.     Initial Home Exercises Provided 12/21/20 12/21/20 12/21/20              Exercise Comments:   Exercise Comments     Row Name 12/14/20 1102           Exercise Comments First full day  of exercise!  Patient was oriented to gym and equipment including functions, settings, policies, and procedures.  Patient's individual exercise prescription and treatment plan were reviewed.  All starting workloads were established based on the results of the 6 minute walk test done at initial orientation visit.  The plan for exercise progression was also introduced and progression will be customized based on patient's performance and goals.                Exercise Goals and Review:   Exercise Goals     Row Name 11/03/20 1315             Exercise Goals   Increase Physical Activity Yes       Intervention Provide advice, education, support and counseling about physical activity/exercise needs.;Develop an individualized exercise  prescription for aerobic and resistive training based on initial evaluation findings, risk stratification, comorbidities and participant's personal goals.       Expected Outcomes Short Term: Attend rehab on a regular basis to increase amount of physical activity.;Long Term: Add in home exercise to make exercise part of routine and to increase amount of physical activity.;Long Term: Exercising regularly at least 3-5 days a week.       Increase Strength and Stamina Yes       Intervention Provide advice, education, support and counseling about physical activity/exercise needs.;Develop an individualized exercise prescription for aerobic and resistive training based on initial evaluation findings, risk stratification, comorbidities and participant's personal goals.       Expected Outcomes Short Term: Increase workloads from initial exercise prescription for resistance, speed, and METs.;Long Term: Improve cardiorespiratory fitness, muscular endurance and strength as measured by increased METs and functional capacity (6MWT);Short Term: Perform resistance training exercises routinely during rehab and add in resistance training at home       Able to understand and use rate of perceived exertion (RPE) scale Yes       Intervention Provide education and explanation on how to use RPE scale       Expected Outcomes Short Term: Able to use RPE daily in rehab to express subjective intensity level;Long Term:  Able to use RPE to guide intensity level when exercising independently       Able to understand and use Dyspnea scale Yes       Intervention Provide education and explanation on how to use Dyspnea scale       Expected Outcomes Short Term: Able to use Dyspnea scale daily in rehab to express subjective sense of shortness of breath during exertion;Long Term: Able to use Dyspnea scale to guide intensity level when exercising independently       Knowledge and understanding of Target Heart Rate Range (THRR) Yes        Intervention Provide education and explanation of THRR including how the numbers were predicted and where they are located for reference       Expected Outcomes Short Term: Able to state/look up THRR;Short Term: Able to use daily as guideline for intensity in rehab;Long Term: Able to use THRR to govern intensity when exercising independently       Able to check pulse independently Yes       Intervention Provide education and demonstration on how to check pulse in carotid and radial arteries.;Review the importance of being able to check your own pulse for safety during independent exercise       Expected Outcomes Short Term: Able to explain why pulse checking is important during independent exercise;Long Term:  Able to check pulse independently and accurately       Understanding of Exercise Prescription Yes       Intervention Provide education, explanation, and written materials on patient's individual exercise prescription       Expected Outcomes Short Term: Able to explain program exercise prescription;Long Term: Able to explain home exercise prescription to exercise independently                Exercise Goals Re-Evaluation :  Exercise Goals Re-Evaluation     Row Name 11/15/20 1610 12/14/20 1102 12/21/20 1125 12/26/20 1134 01/10/21 1614     Exercise Goal Re-Evaluation   Exercise Goals Review -- Increase Physical Activity;Understanding of Exercise Prescription;Knowledge and understanding of Target Heart Rate Range (THRR);Able to understand and use rate of perceived exertion (RPE) scale;Increase Strength and Stamina;Able to understand and use Dyspnea scale;Able to check pulse independently Increase Physical Activity;Understanding of Exercise Prescription;Knowledge and understanding of Target Heart Rate Range (THRR);Able to understand and use rate of perceived exertion (RPE) scale;Increase Strength and Stamina;Able to understand and use Dyspnea scale;Able to check pulse independently Increase  Physical Activity;Increase Strength and Stamina Increase Physical Activity;Increase Strength and Stamina;Understanding of Exercise Prescription   Comments Has yet to start exercise sessions due to new job Reviewed RPE and dyspnea scales, THR and program prescription with pt today.  Pt voiced understanding and was given a copy of goals to take home. Reviewed home exercise with pt today.  Pt plans to walk for exercise.   Reviewed THR, pulse, RPE, sign and symptoms, pulse oximetery and when to call 911 or MD.  Also discussed weather considerations and indoor options.  Pt voiced understanding. Bonnie Ochoa is doing well and walked 45 laps on the track!  The arm cracnk is hard for her.  Staff will monitor progress. Bonnie Ochoa continues to do well in rehab.  Her track laps very between 22-41.  We will encourage more consistency on speed and monitor her progress.   Expected Outcomes -- Short: Use RPE daily to regulate intensity. Long: Follow program prescription in THR. Short: Practice taking HR and/or purchase pulse ox and monitor HR during exercise Long: Follow appropriate exercise presciption at home Short: attend consistently Long:  build overall stamina Short: Walk at constant speed on track Long: Continue to improve stamina    Row Name 01/13/21 0958 01/25/21 0745 02/06/21 1012 02/20/21 1034 03/06/21 1540     Exercise Goal Re-Evaluation   Exercise Goals Review Increase Physical Activity;Increase Strength and Stamina Increase Physical Activity;Increase Strength and Stamina Increase Physical Activity;Increase Strength and Stamina Increase Physical Activity;Increase Strength and Stamina Increase Physical Activity;Increase Strength and Stamina   Comments Bonnie Ochoa is walking at home for exercise. She is considering joining a gym when she is done with the program. Informed her that walking at home is good but to make sure she is getting her heart rate up enough. Bonnie Ochoa is up to level 5 on Nustep.  She got 37 laps walking track.  She  works at [employer] but doesnt reach THR. Bonnie Ochoa walks outside program sessions. We reviewed THR and how to check pulse. Bonnie Ochoa is continuing to do well in rehab. She is not quite hitting her THR and should  increase her pace and laps to help increase her HR. She is tolerating the T4 Nustep at level 5 pretty well. Will continue to monitor. Bonnie Ochoa improved her post 6MWT by 19%.  She has missed the past two weeks due to transportation issues.  Hopefully she will be  able to return to finish up.   Expected Outcomes Short: walk faster if it is to easy. Long: maintain walking at home post [facility]. Short: work toward [place]:  increase stamina Short:  practice checking HR during exercise Long: improve overall stamina Short: Increase numbers on track Long: Continue to increase overall MET level Short: Return to graudate Long: Continue to exericse at home            Discharge Exercise Prescription (Final Exercise Prescription Changes):  Exercise Prescription Changes - 03/06/21 1500       Response to Exercise   Blood Pressure (Admit) 124/76    Blood Pressure (Exit) 128/70    Heart Rate (Admit) 83 bpm    Heart Rate (Exercise) 93 bpm    Heart Rate (Exit) 88 bpm    Oxygen Saturation (Admit) 98 %    Oxygen Saturation (Exercise) 94 %    Oxygen Saturation (Exit) 98 %    Rating of Perceived Exertion (Exercise) 10    Symptoms none    Duration Continue with 30 min of aerobic exercise without signs/symptoms of physical distress.    Intensity THRR unchanged      Progression   Progression Continue to progress workloads to maintain intensity without signs/symptoms of physical distress.    Average METs 1.8      Resistance Training   Training Prescription Yes    Weight 3 lb    Reps 10-15      Interval Training   Interval Training No      NuStep   Level 4    Minutes 30      Arm Ergometer   Level 1    Minutes 15    METs 1.8      Track   Laps 21    Minutes 15    METs 2.14      Home Exercise  Plan   Plans to continue exercise at Home (comment)   walking   Frequency Add 2 additional days to program exercise sessions.    Initial Home Exercises Provided 12/21/20             Nutrition:  Target Goals: Understanding of nutrition guidelines, daily intake of sodium '1500mg'$ , cholesterol '200mg'$ , calories 30% from fat and 7% or less from saturated fats, daily to have 5 or more servings of fruits and vegetables.  Education: All About Nutrition: -Group instruction provided by verbal, written material, interactive activities, discussions, models, and posters to present general guidelines for heart healthy nutrition including fat, fiber, MyPlate, the role of sodium in heart healthy nutrition, utilization of the nutrition label, and utilization of this knowledge for meal planning. Follow up email sent as well. Written material given at graduation. Flowsheet Row Cardiac Rehab from 02/08/2021 in [facility] Cardiac and Pulmonary Rehab  Education need identified 11/03/20       Biometrics:  Pre Biometrics - 11/03/20 1315       Pre Biometrics   Height $Remov'5\' 5"'TewClw$  (1.651 m)    Weight 178 lb 3.2 oz (80.8 kg)    BMI (Calculated) 29.65    Single Leg Stand 10 seconds             Post Biometrics - 02/15/21 1149        Post  Biometrics   Height $Remov'5\' 5"'TiFnUB$  (1.651 m)    Weight 189 lb 8 oz (86 kg)    BMI (Calculated) 31.53    Single Leg Stand 3.3 seconds  Nutrition Therapy Plan and Nutrition Goals:  Nutrition Therapy & Goals - 12/28/20 1047       Nutrition Therapy   RD appointment deferred Yes   Bonnie Ochoa would not like to talk to RD at this time - will continue to check in.            Nutrition Assessments:  MEDIFICTS Score Key: ?70 Need to make dietary changes  40-70 Heart Healthy Diet ? 40 Therapeutic Level Cholesterol Diet  Flowsheet Row Cardiac Rehab from 02/17/2021 in [facility] Cardiac and Pulmonary Rehab  Picture Your Plate Total Score on Discharge 65      Picture Your  Plate Scores: <74 Unhealthy dietary pattern with much room for improvement. 41-50 Dietary pattern unlikely to meet recommendations for good health and room for improvement. 51-60 More healthful dietary pattern, with some room for improvement.  >60 Healthy dietary pattern, although there may be some specific behaviors that could be improved.    Nutrition Goals Re-Evaluation:  Nutrition Goals Re-Evaluation     Row Name 12/21/20 1144 01/13/21 1002 02/06/21 1011         Goals   Current Weight -- 185 lb (83.9 kg) --     Nutrition Goal -- Meet with the dietician. Has not met with RD     Comment Lesly Rubenstein has not met with Efraim Kaufmann, RD yet to establish nutrition goals. She did not meet yet as she missed her appt and will reschedule to get a new one. Patient states that she ate a breakfast sandwich this morning from a fast fookd restaruant. She has a sandwich and chips for lunch. She will eat steak, chicken and some vegetables. --     Expected Outcome Short: Get appointment with RD Long: Maintain heart healthy diet Short: meet with the dietician. Long: adhere to a diet that pertains to her needs. Short: meet with RD Long: develop heart healthy diet              Nutrition Goals Discharge (Final Nutrition Goals Re-Evaluation):  Nutrition Goals Re-Evaluation - 02/06/21 1011       Goals   Nutrition Goal Has not met with RD    Expected Outcome Short: meet with RD Long: develop heart healthy diet             Psychosocial: Target Goals: Acknowledge presence or absence of significant depression and/or stress, maximize coping skills, provide positive support system. Participant is able to verbalize types and ability to use techniques and skills needed for reducing stress and depression.   Education: Stress, Anxiety, and Depression - Group verbal and visual presentation to define topics covered.  Reviews how body is impacted by stress, anxiety, and depression.  Also discusses healthy ways to  reduce stress and to treat/manage anxiety and depression.  Written material given at graduation. Flowsheet Row Cardiac Rehab from 02/08/2021 in [facility] Cardiac and Pulmonary Rehab  Education need identified 11/03/20       Education: Sleep Hygiene -Provides group verbal and written instruction about how sleep can affect your health.  Define sleep hygiene, discuss sleep cycles and impact of sleep habits. Review good sleep hygiene tips.    Initial Review & Psychosocial Screening:  Initial Psych Review & Screening - 10/26/20 1048       Initial Review   Current issues with Current Depression;Current Psychotropic Meds      Family Dynamics   Good Support System? Yes   wife Joanie Coddington, sister, children live in [place]     Barriers  Psychosocial barriers to participate in program There are no identifiable barriers or psychosocial needs.      Screening Interventions   Interventions Encouraged to exercise;To provide support and resources with identified psychosocial needs;Provide feedback about the scores to participant    Expected Outcomes Short Term goal: Utilizing psychosocial counselor, staff and physician to assist with identification of specific Stressors or current issues interfering with healing process. Setting desired goal for each stressor or current issue identified.;Long Term Goal: Stressors or current issues are controlled or eliminated.;Short Term goal: Identification and review with participant of any Quality of Life or Depression concerns found by scoring the questionnaire.;Long Term goal: The participant improves quality of Life and PHQ9 Scores as seen by post scores and/or verbalization of changes             Quality of Life Scores:   Quality of Life - 02/17/21 1003       Quality of Life   Select Quality of Life      Quality of Life Scores   Health/Function Pre 17.5 %    Health/Function Post 18.92 %    Health/Function % Change 8.11 %    Socioeconomic Pre 18.56 %     Socioeconomic Post 24.58 %    Socioeconomic % Change  32.44 %    Psych/Spiritual Pre 25.14 %    Psych/Spiritual Post 25 %    Psych/Spiritual % Change -0.56 %    Family Pre 25.2 %    Family Post 27.6 %    Family % Change 9.52 %    GLOBAL Pre 20.37 %    GLOBAL Post 22.72 %    GLOBAL % Change 11.54 %            Scores of 19 and below usually indicate a poorer quality of life in these areas.  A difference of  2-3 points is a clinically meaningful difference.  A difference of 2-3 points in the total score of the Quality of Life Index has been associated with significant improvement in overall quality of life, self-image, physical symptoms, and general health in studies assessing change in quality of life.  PHQ-9: Recent Review Flowsheet Data     Depression screen [facility] 2/9 02/17/2021 01/10/2021 11/03/2020 09/09/2020 07/11/2020   Decreased Interest 3 1 0 0 2   Down, Depressed, Hopeless 3 2 2  0 1   PHQ - 2 Score 6 3 2  0 3   Altered sleeping 3 0 0 - 0   Tired, decreased energy 3 1 1  - 3   Change in appetite 1 0 0 - 0   Feeling bad or failure about yourself  3 2 2  - 0   Trouble concentrating 2 1 2  - 0   Moving slowly or fidgety/restless 0 3 0 - 0   Suicidal thoughts 1 0 0 - 0   PHQ-9 Score 19 10 7  - 6   Difficult doing work/chores Somewhat difficult Not difficult at all Not difficult at all - -      Interpretation of Total Score  Total Score Depression Severity:  1-4 = Minimal depression, 5-9 = Mild depression, 10-14 = Moderate depression, 15-19 = Moderately severe depression, 20-27 = Severe depression   Psychosocial Evaluation and Intervention:  Psychosocial Evaluation - 10/26/20 1111       Psychosocial Evaluation & Interventions   Interventions Encouraged to exercise with the program and follow exercise prescription    Comments Bonnie Ochoa has no barriers to starting the program. She has history  of depression and is under the care of physician and has Paxil prscribed. She moved to [place] in  the past 5 years with he sister. SHe is married for 3 years to her wife LaTricia.  She has grown children that live in [place]. Her wife aand sister are her support system. THey all live together in a home with no pets. Her goal is to increase her energy. She should do well in the program.    Expected Outcomes STG: Bonnie Ochoa attends all scheduled sessions, and is able to progress her exercise prescription over time. LTG: she has gained more energy/stamina and is able to maintain this with continuing what she learned in the program. [facility] keeps her depression under control with continued meds and counseling    Continue Psychosocial Services  Follow up required by staff             Psychosocial Re-Evaluation:  Psychosocial Re-Evaluation     [place] Name 12/21/20 1140 01/13/21 1007 02/06/21 1009         Psychosocial Re-Evaluation   Current issues with Current Depression;Current Psychotropic Meds Current Depression;History of Depression;Current Psychotropic Meds Current Depression;History of Depression;Current Psychotropic Meds     Comments Bonnie Ochoa reports that she is doing well. She continues to have ongoing support from her wife and kids. She is currently on medication for depression and is in the process of switching providers for a therapist. She reports the medication is helping and does not think she needs any other intervention at this time. Reports that sleep is good and has no complaints. Bonnie Ochoa feels like she is depressed since her mother died in Nov 17, 2012. She has no other concernes since last follow up. Bonnie Ochoa takes meds as directed for depression.  She feels exercise has helped some.  She sleeps well and has no other concerns     Expected Outcomes Short: Establish new provider for therapy Long: Utilize exercise for stress management and maintain positive attitude Short: Continue to exercise regularly to support mental health and notify staff of any changes. Long: maintain mental health and well being through  teaching of rehab or prescribed medications independently. Short: continue to exercise and take meds as directed Long:  attend relaxation education for review on breathing techniques     Interventions Encouraged to attend Cardiac Rehabilitation for the exercise Encouraged to attend Cardiac Rehabilitation for the exercise --     Continue Psychosocial Services  Follow up required by staff Follow up required by staff --              Psychosocial Discharge (Final Psychosocial Re-Evaluation):  Psychosocial Re-Evaluation - 02/06/21 1009       Psychosocial Re-Evaluation   Current issues with Current Depression;History of Depression;Current Psychotropic Meds    Comments Bonnie Ochoa takes meds as directed for depression.  She feels exercise has helped some.  She sleeps well and has no other concerns    Expected Outcomes Short: continue to exercise and take meds as directed Long:  attend relaxation education for review on breathing techniques             Vocational Rehabilitation: Provide vocational rehab assistance to qualifying candidates.   Vocational Rehab Evaluation & Intervention:  Vocational Rehab - 10/26/20 1053       Initial Vocational Rehab Evaluation & Intervention   Assessment shows need for Vocational Rehabilitation No             Education: Education Goals: Education classes will be provided on a variety of topics  geared toward better understanding of heart health and risk factor modification. Participant will state understanding/return demonstration of topics presented as noted by education test scores.  Learning Barriers/Preferences:  Learning Barriers/Preferences - 10/26/20 1053       Learning Barriers/Preferences   Learning Barriers None    Learning Preferences None             General Cardiac Education Topics:  AED/CPR: - Group verbal and written instruction with the use of models to demonstrate the basic use of the AED with the basic ABC's of  resuscitation.   Anatomy and Cardiac Procedures: - Group verbal and visual presentation and models provide information about basic cardiac anatomy and function. Reviews the testing methods done to diagnose heart disease and the outcomes of the test results. Describes the treatment choices: Medical Management, Angioplasty, or Coronary Bypass Surgery for treating various heart conditions including Myocardial Infarction, Angina, Valve Disease, and Cardiac Arrhythmias.  Written material given at graduation. Flowsheet Row Cardiac Rehab from 02/08/2021 in [facility] Cardiac and Pulmonary Rehab  Education need identified 11/03/20  Date 02/08/21  Educator SB  Instruction Review Code 1- Verbalizes Understanding       Medication Safety: - Group verbal and visual instruction to review commonly prescribed medications for heart and lung disease. Reviews the medication, class of the drug, and side effects. Includes the steps to properly store meds and maintain the prescription regimen.  Written material given at graduation. Flowsheet Row Cardiac Rehab from 02/08/2021 in [facility] Cardiac and Pulmonary Rehab  Date 12/28/20  Educator SB  Instruction Review Code 1- Verbalizes Understanding       Intimacy: - Group verbal instruction through game format to discuss how heart and lung disease can affect sexual intimacy. Written material given at graduation.. Flowsheet Row Cardiac Rehab from 02/08/2021 in [facility] Cardiac and Pulmonary Rehab  Date 02/01/21  Educator [facility]  Instruction Review Code 1- Verbalizes Understanding       Know Your Numbers and Heart Failure: - Group verbal and visual instruction to discuss disease risk factors for cardiac and pulmonary disease and treatment options.  Reviews associated critical values for Overweight/Obesity, Hypertension, Cholesterol, and Diabetes.  Discusses basics of heart failure: signs/symptoms and treatments.  Introduces Heart Failure Zone chart for action plan for heart  failure.  Written material given at graduation. Flowsheet Row Cardiac Rehab from 02/08/2021 in [facility] Cardiac and Pulmonary Rehab  Date 01/04/21  Educator [facility]  Instruction Review Code 1- Verbalizes Understanding       Infection Prevention: - Provides verbal and written material to individual with discussion of infection control including proper hand washing and proper equipment cleaning during exercise session. Flowsheet Row Cardiac Rehab from 02/08/2021 in [facility] Cardiac and Pulmonary Rehab  Date 11/03/20  Educator [facility]  Instruction Review Code 1- Verbalizes Understanding       Falls Prevention: - Provides verbal and written material to individual with discussion of falls prevention and safety. Flowsheet Row Cardiac Rehab from 02/08/2021 in [facility] Cardiac and Pulmonary Rehab  Date 10/26/20  Educator SB  Instruction Review Code 1- Verbalizes Understanding       Other: -Provides group and verbal instruction on various topics (see comments)   Knowledge Questionnaire Score:  Knowledge Questionnaire Score - 02/17/21 1003       Knowledge Questionnaire Score   Post Score 18/26             Core Components/Risk Factors/Patient Goals at Admission:  Personal Goals and Risk Factors at Admission - 11/03/20 1317  Core Components/Risk Factors/Patient Goals on Admission    Weight Management Yes;Weight Maintenance    Intervention Weight Management: Develop a combined nutrition and exercise program designed to reach desired caloric intake, while maintaining appropriate intake of nutrient and fiber, sodium and fats, and appropriate energy expenditure required for the weight goal.;Weight Management: Provide education and appropriate resources to help participant work on and attain dietary goals.;Obesity: Provide education and appropriate resources to help participant work on and attain dietary goals.    Admit Weight 178 lb 3.2 oz (80.8 kg)    Goal Weight: Short Term 173 lb (78.5 kg)     Goal Weight: Long Term 165 lb (74.8 kg)    Expected Outcomes Short Term: Continue to assess and modify interventions until short term weight is achieved;Long Term: Adherence to nutrition and physical activity/exercise program aimed toward attainment of established weight goal;Weight Loss: Understanding of general recommendations for a balanced deficit meal plan, which promotes 1-2 lb weight loss per week and includes a negative energy balance of [phone] kcal/d;Understanding recommendations for meals to include 15-35% energy as protein, 25-35% energy from fat, 35-60% energy from carbohydrates, less than $RemoveB'200mg'LqltTqng$  of dietary cholesterol, 20-35 gm of total fiber daily;Understanding of distribution of calorie intake throughout the day with the consumption of 4-5 meals/snacks    Diabetes Yes    Intervention Provide education about signs/symptoms and action to take for hypo/hyperglycemia.;Provide education about proper nutrition, including hydration, and aerobic/resistive exercise prescription along with prescribed medications to achieve blood glucose in normal ranges: Fasting glucose 65-99 mg/dL    Expected Outcomes Short Term: Participant verbalizes understanding of the signs/symptoms and immediate care of hyper/hypoglycemia, proper foot care and importance of medication, aerobic/resistive exercise and nutrition plan for blood glucose control.;Long Term: Attainment of HbA1C < 7%.    Heart Failure Yes    Intervention Provide a combined exercise and nutrition program that is supplemented with education, support and counseling about heart failure. Directed toward relieving symptoms such as shortness of breath, decreased exercise tolerance, and extremity edema.    Expected Outcomes Improve functional capacity of life;Short term: Attendance in program 2-3 days a week with increased exercise capacity. Reported lower sodium intake. Reported increased fruit and vegetable intake. Reports medication compliance.;Short term:  Daily weights obtained and reported for increase. Utilizing diuretic protocols set by physician.;Long term: Adoption of self-care skills and reduction of barriers for early signs and symptoms recognition and intervention leading to self-care maintenance.    Hypertension Yes    Intervention Provide education on lifestyle modifcations including regular physical activity/exercise, weight management, moderate sodium restriction and increased consumption of fresh fruit, vegetables, and low fat dairy, alcohol moderation, and smoking cessation.;Monitor prescription use compliance.    Expected Outcomes Short Term: Continued assessment and intervention until BP is < 140/7mm HG in hypertensive participants. < 130/77mm HG in hypertensive participants with diabetes, heart failure or chronic kidney disease.;Long Term: Maintenance of blood pressure at goal levels.    Lipids Yes    Intervention Provide education and support for participant on nutrition & aerobic/resistive exercise along with prescribed medications to achieve LDL '70mg'$ , HDL >$Remo'40mg'KShOa$ .    Expected Outcomes Short Term: Participant states understanding of desired cholesterol values and is compliant with medications prescribed. Participant is following exercise prescription and nutrition guidelines.;Long Term: Cholesterol controlled with medications as prescribed, with individualized exercise RX and with personalized nutrition plan. Value goals: LDL < $Rem'70mg'EhuN$ , HDL > 40 mg.             Education:Diabetes - Individual  verbal and written instruction to review signs/symptoms of diabetes, desired ranges of glucose level fasting, after meals and with exercise. Acknowledge that pre and post exercise glucose checks will be done for 3 sessions at entry of program.   Core Components/Risk Factors/Patient Goals Review:   Goals and Risk Factor Review     Row Name 12/21/20 1133 01/13/21 1001 02/06/21 1006         Core Components/Risk Factors/Patient Goals Review    Personal Goals Review Weight Management/Obesity;Heart Failure;Hypertension;Diabetes Weight Management/Obesity;Diabetes Weight Management/Obesity;Diabetes     Review Weight has been stable, she does weigh her self at home and is aware to report and look for any sudden weight gain. At this time, she denies any symptoms for heart failure. We did discuss a hand out for heart failure, symptoms to look out for and has a take home handout to reference to. She ran out of test strips but normally checks her sugars twice/day. Her doctor wants it around 100 and reports her sugars around 90-low 100s. Her A1C trend has decreased and applauded her to continue lowering that number. I did stress importance of checking sugars, especially if ever symptomatic, and encouraged her to get new test strips as soon as possible. She does not check her BP and was also encouraged to get a cuff for home use. -- Bonnie Ochoa reports her weight has been going up and down.  She reports no symptoms of heart failure.  Her FBG has been around 86.  She doesnt have a BP cuff yet.     Expected Outcomes Short: Obtain more test strips and home BP cuff Long: Continue to manage lifestyle risk factors -- Short:  get BP cuff for home Long: continue to manage risk factors              Core Components/Risk Factors/Patient Goals at Discharge (Final Review):   Goals and Risk Factor Review - 02/06/21 1006       Core Components/Risk Factors/Patient Goals Review   Personal Goals Review Weight Management/Obesity;Diabetes    Review Bonnie Ochoa reports her weight has been going up and down.  She reports no symptoms of heart failure.  Her FBG has been around 86.  She doesnt have a BP cuff yet.    Expected Outcomes Short:  get BP cuff for home Long: continue to manage risk factors             ITP Comments:  ITP Comments     Row Name 10/26/20 1110 11/03/20 1310 11/04/20 0830 11/15/20 1610 11/30/20 0622   ITP Comments Virtual orientation call completed  today. shehas an appointment on Date: 11/03/2020 for EP eval and gym Orientation.  Documentation of diagnosis [facility] be found in [facility]  Date: 06/29/2020. Completed 6MWT and gym orientation. Initial ITP created and sent for review to Dr. Emily Filbert, Medical Director. Bonnie Ochoa called to let us know that she has started a new job and will be working from [employer] each day.  Her current time slot of 945 will not work with her new scheduled.  She would like to wait two weeks before starting rehab to get used to her job and to see which time would work best for her.  We will touch base again in two weeks. Waiting to hear back from new job info. 30 Day review completed. Medical Director ITP review done, changes made as directed, and signed approval by Medical Director.No visits in April    Row Name 12/14/20 1102 12/28/20 0746 01/25/21 9024  02/22/21 1124 03/06/21 1540   ITP Comments First full day of exercise!  Patient was oriented to gym and equipment including functions, settings, policies, and procedures.  Patient's individual exercise prescription and treatment plan were reviewed.  All starting workloads were established based on the results of the 6 minute walk test done at initial orientation visit.  The plan for exercise progression was also introduced and progression will be customized based on patient's performance and goals. 30 Day review completed. Medical Director ITP review done, changes made as directed, and signed approval by Medical Director.  New to program 30 Day review completed. Medical Director ITP review done, changes made as directed, and signed approval by Medical Director. 30 Day review completed. Medical Director ITP review done, changes made as directed, and signed approval by Medical Director. Bonnie Ochoa has been out since 7/11 with transportation issues.  She was given contact for [facility]. She only has 4 visits left.    Row Name 03/09/21 1007 03/22/21 0756 04/03/21 0849       ITP Comments Called Bonnie Ochoa to see  if she would like to graduate at 40 visits as she only has four left. LM. 30 Day review completed. Medical Director ITP review done, changes made as directed, and signed approval by Medical Director. There has been no response back from [place] after multiple attempts. Will discharge patient at this time.              Comments: Discharge ITP

## 2021-04-03 NOTE — Progress Notes (Signed)
Discharge Progress Report  Patient Details  Name: Bonnie Ochoa MRN: [id number] Date of Birth: September 07, 1965 Referring Provider:   Flowsheet Row Cardiac Rehab from 11/03/2020 in [facility] Cardiac and Pulmonary Rehab  Referring Provider Glori Bickers MD  [facility] Cardiologist: Dr. Ida Rogue        Number of Visits: 32  Reason for Discharge:  Patient reached a stable level of exercise. Patient independent in their exercise. Patient has met program and personal goals.  Smoking History:  Social History   Tobacco Use  Smoking Status Former   Types: Cigarettes   Quit date: 1985   Years since quitting: 37.6  Smokeless Tobacco Never  Tobacco Comments   quit over  20 years ago     Diagnosis:  Chronic systolic heart failure ([place])  ADL UCSD:   Initial Exercise Prescription:  Initial Exercise Prescription - 11/03/20 1300       Date of Initial Exercise RX and Referring Provider   Date 11/03/20    Referring Provider Glori Bickers MD   Primary Cardiologist: Dr. Ida Rogue     Treadmill   MPH 2    Grade 1    Minutes 15    METs 2.81      Recumbant Bike   Level 2    RPM 50    Watts 28    Minutes 15    METs 2      Arm Ergometer   Level 1    Watts 25    RPM 25    Minutes 15    METs 2      REL-XR   Level 3    Speed 50    Minutes 15    METs 2      Prescription Details   Frequency (times per week) 3    Duration Progress to 30 minutes of continuous aerobic without signs/symptoms of physical distress      Intensity   THRR 40-80% of Max Heartrate 101-143    Ratings of Perceived Exertion 11-13    Perceived Dyspnea 0-4      Progression   Progression Continue to progress workloads to maintain intensity without signs/symptoms of physical distress.      Resistance Training   Training Prescription Yes    Weight 3 lb    Reps 10-15             Discharge Exercise Prescription (Final Exercise Prescription Changes):  Exercise Prescription Changes -  03/06/21 1500       Response to Exercise   Blood Pressure (Admit) 124/76    Blood Pressure (Exit) 128/70    Heart Rate (Admit) 83 bpm    Heart Rate (Exercise) 93 bpm    Heart Rate (Exit) 88 bpm    Oxygen Saturation (Admit) 98 %    Oxygen Saturation (Exercise) 94 %    Oxygen Saturation (Exit) 98 %    Rating of Perceived Exertion (Exercise) 10    Symptoms none    Duration Continue with 30 min of aerobic exercise without signs/symptoms of physical distress.    Intensity THRR unchanged      Progression   Progression Continue to progress workloads to maintain intensity without signs/symptoms of physical distress.    Average METs 1.8      Resistance Training   Training Prescription Yes    Weight 3 lb    Reps 10-15      Interval Training   Interval Training No      NuStep   Level 4  Minutes 30      Arm Ergometer   Level 1    Minutes 15    METs 1.8      Track   Laps 21    Minutes 15    METs 2.14      Home Exercise Plan   Plans to continue exercise at Home (comment)   walking   Frequency Add 2 additional days to program exercise sessions.    Initial Home Exercises Provided 12/21/20             Functional Capacity:  6 Minute Walk     Row Name 11/03/20 1311 02/15/21 1149       6 Minute Walk   Phase Initial Discharge    Distance 1095 feet 1305 feet    Distance % Change -- 19.2 %    Distance Feet Change -- 210 ft    Walk Time 6 minutes 6 minutes    # of Rest Breaks 0 0    MPH 2.07 2.47    METS 3.1 3.39    RPE 13 12    VO2 Peak 10.86 11.88    Symptoms Yes (comment) No    Comments R thigh pain 4/10 --    Resting HR 59 bpm 88 bpm    Resting BP 142/84 122/76    Resting Oxygen Saturation  97 % 97 %    Exercise Oxygen Saturation  during 6 min walk 99 % 98 %    Max Ex. HR 81 bpm 106 bpm    Max Ex. BP 154/86 128/72    2 Minute Post BP 138/80 --             Psychological, QOL, Others - Outcomes: PHQ 2/9: Depression screen [facility] 2/9 02/17/2021 01/10/2021  11/03/2020 09/09/2020 07/11/2020  Decreased Interest 3 1 0 0 2  Down, Depressed, Hopeless _0 0 1  PHQ - 2 Score _1 0 3  Altered sleeping 3 0 0 - 0  Tired, decreased energy _2 - 3  Change in appetite 1 0 0 - 0  Feeling bad or failure about yourself  _3 - 0  Trouble concentrating _4 - 0  Moving slowly or fidgety/restless 0 3 0 - 0  Suicidal thoughts 1 0 0 - 0  PHQ-9 Score _5 - 6  Difficult doing work/chores Somewhat difficult Not difficult at all Not difficult at all - -  Some encounter information is confidential and restricted. Go to Review Flowsheets activity to see all data.  Some recent data might be hidden     Nutrition & Weight - Outcomes:  Pre Biometrics - 11/03/20 1315       Pre Biometrics   Height _6  (1.651 m)    Weight 178 lb 3.2 oz (80.8 kg)    BMI (Calculated) 29.65    Single Leg Stand 10 seconds             Post Biometrics - 02/15/21 1149        Post  Biometrics   Height _7  (1.651 m)    Weight 189 lb 8 oz (86 kg)    BMI (Calculated) 31.53    Single Leg Stand 3.3 seconds             Nutrition:  Nutrition Therapy & Goals - 12/28/20 1047       Nutrition Therapy   RD appointment deferred Yes   Jade would not like  to talk to RD at this time - will continue to check in.            Goals reviewed with patient; copy given to patient.

## 2021-04-09 ENCOUNTER — Other Ambulatory Visit: Payer: Self-pay | Admitting: Psychiatry

## 2021-04-09 DIAGNOSIS — F3131 Bipolar disorder, current episode depressed, mild: Secondary | ICD-10-CM

## 2021-04-13 ENCOUNTER — Encounter: Payer: Self-pay | Admitting: Family Medicine

## 2021-04-13 ENCOUNTER — Ambulatory Visit (INDEPENDENT_AMBULATORY_CARE_PROVIDER_SITE_OTHER): Payer: Medicare Other | Admitting: Family Medicine

## 2021-04-13 ENCOUNTER — Other Ambulatory Visit: Payer: Self-pay

## 2021-04-13 VITALS — BP 112/78 | HR 85 | Temp 98.7°F | Ht 64.61 in | Wt 194.0 lb

## 2021-04-13 DIAGNOSIS — E1122 Type 2 diabetes mellitus with diabetic chronic kidney disease: Secondary | ICD-10-CM | POA: Diagnosis not present

## 2021-04-13 DIAGNOSIS — Z794 Long term (current) use of insulin: Secondary | ICD-10-CM

## 2021-04-13 DIAGNOSIS — N1831 Chronic kidney disease, stage 3a: Secondary | ICD-10-CM

## 2021-04-13 DIAGNOSIS — F419 Anxiety disorder, unspecified: Secondary | ICD-10-CM | POA: Diagnosis not present

## 2021-04-13 DIAGNOSIS — I200 Unstable angina: Secondary | ICD-10-CM | POA: Diagnosis not present

## 2021-04-13 LAB — BAYER DCA HB A1C WAIVED: HB A1C (BAYER DCA - WAIVED): 9.5 % — ABNORMAL HIGH (ref <7.0)

## 2021-04-13 MED ORDER — LANTUS SOLOSTAR 100 UNIT/ML ~~LOC~~ SOPN: 40.0000 [IU] | PEN_INJECTOR | Freq: Every day | SUBCUTANEOUS | 1 refills | Status: DC

## 2021-04-13 NOTE — Assessment & Plan Note (Signed)
Not under good control. A1c up to 9.5- will increase her lantus to 40units recheck 1 month. Encouraged her to check her BS at least 3x a week.

## 2021-04-13 NOTE — Progress Notes (Signed)
BP 112/78   Pulse 85   Temp 98.7 F (37.1 C) (Oral)   Ht 5' 4.61" (1.641 m)   Wt 194 lb (88 kg)   SpO2 98%   BMI 32.68 kg/m    Subjective:    Patient ID: Bonnie Ochoa, female    DOB: Sep 29, 1965, 55 y.o.   MRN: [id number]  HPI: Bonnie Ochoa is a 55 y.o. female  Chief Complaint  Patient presents with   Diabetes   DIABETES Hypoglycemic episodes:no Polydipsia/polyuria: no Visual disturbance: no Chest pain: no Paresthesias: no Glucose Monitoring: no  Accucheck frequency: Not Checking Taking Insulin?: no Blood Pressure Monitoring: not checking Retinal Examination: Not up to Date Foot Exam: Not up to Date Diabetic Education: Completed Pneumovax: Up to Date Influenza:  postpone until available Aspirin: no  She has been under a lot of stress. Her son is missing in [place].   Relevant past medical, surgical, family and social history reviewed and updated as indicated. Interim medical history since our last visit reviewed. Allergies and medications reviewed and updated.  Review of Systems  Constitutional: Negative.   Respiratory: Negative.    Cardiovascular: Negative.   Gastrointestinal: Negative.   Musculoskeletal: Negative.   Skin: Negative.   Neurological: Negative.   Psychiatric/Behavioral:  Positive for dysphoric mood. Negative for agitation, behavioral problems, confusion, decreased concentration, hallucinations, self-injury, sleep disturbance and suicidal ideas. The patient is nervous/anxious. The patient is not hyperactive.    Per HPI unless specifically indicated above     Objective:    BP 112/78   Pulse 85   Temp 98.7 F (37.1 C) (Oral)   Ht 5' 4.61" (1.641 m)   Wt 194 lb (88 kg)   SpO2 98%   BMI 32.68 kg/m   Wt Readings from Last 3 Encounters:  04/13/21 194 lb (88 kg)  02/15/21 189 lb 8 oz (86 kg)  01/18/21 184 lb 6.4 oz (83.6 kg)    Physical Exam Vitals and nursing note reviewed.  Constitutional:      General: She is not in acute  distress.    Appearance: Normal appearance. She is not ill-appearing, toxic-appearing or diaphoretic.  HENT:     Head: Normocephalic and atraumatic.     Right Ear: External ear normal.     Left Ear: External ear normal.     Nose: Nose normal.     Mouth/Throat:     Mouth: Mucous membranes are moist.     Pharynx: Oropharynx is clear.  Eyes:     General: No scleral icterus.       Right eye: No discharge.        Left eye: No discharge.     Extraocular Movements: Extraocular movements intact.     Conjunctiva/sclera: Conjunctivae normal.     Pupils: Pupils are equal, round, and reactive to light.  Cardiovascular:     Rate and Rhythm: Normal rate and regular rhythm.     Pulses: Normal pulses.     Heart sounds: Normal heart sounds. No murmur heard.   No friction rub. No gallop.  Pulmonary:     Effort: Pulmonary effort is normal. No respiratory distress.     Breath sounds: Normal breath sounds. No stridor. No wheezing, rhonchi or rales.  Chest:     Chest wall: No tenderness.  Musculoskeletal:        General: Normal range of motion.     Cervical back: Normal range of motion and neck supple.  Skin:    General: Skin is warm  and dry.     Capillary Refill: Capillary refill takes less than 2 seconds.     Coloration: Skin is not jaundiced or pale.     Findings: No bruising, erythema, lesion or rash.  Neurological:     General: No focal deficit present.     Mental Status: She is alert and oriented to person, place, and time. Mental status is at baseline.  Psychiatric:        Mood and Affect: Mood normal.        Behavior: Behavior normal.        Thought Content: Thought content normal.        Judgment: Judgment normal.    Results for orders placed or performed in visit on 69/22/30  Basic metabolic panel  Result Value Ref Range   Glucose 310 (H) 65 - 99 mg/dL   BUN 18 6 - 24 mg/dL   Creatinine, Ser 1.37 (H) 0.57 - 1.00 mg/dL   eGFR 45 (L) >59 mL/min/1.73   BUN/Creatinine Ratio 13 9 -  23   Sodium 140 134 - 144 mmol/L   Potassium 4.0 3.5 - 5.2 mmol/L   Chloride 101 96 - 106 mmol/L   CO2 23 20 - 29 mmol/L   Calcium 9.2 8.7 - 10.2 mg/dL      Assessment & Plan:   Problem List Items Addressed This Visit       Endocrine   DM (diabetes mellitus), type 2 with renal complications ([place]) - Primary    Not under good control. A1c up to 9.5- will increase her lantus to 40units recheck 1 month. Encouraged her to check her BS at least 3x a week.       Relevant Medications   insulin glargine (LANTUS SOLOSTAR) 100 UNIT/ML Solostar Pen   Other Relevant Orders   Bayer DCA Hb A1c Waived   Other Visit Diagnoses     Acute anxiety       Will get her hooked in with social work. Call with any concerns. Continue to follow with psychiatry.   Relevant Orders   AMB Referral to [facility] Coordinaton        Follow up plan: Return in about 4 weeks (around 05/11/2021).

## 2021-04-14 ENCOUNTER — Telehealth: Payer: Self-pay | Admitting: Licensed Clinical Social Worker

## 2021-04-14 NOTE — Telephone Encounter (Signed)
    Clinical Social Work  Chronic Care Management   Phone Outreach    04/14/2021 Name: Zakari Couchman MRN: [id number] DOB: [birth year]  Latroya Ng is a 55 y.o. year old female who is a primary care patient of Dorcas Carrow, DO .   Reason for referral: Mental Health Counseling and Resources.    CCM LCSW reached out to patient today by phone to introduce self, assess needs and offer Care Management services and interventions.    Telephone outreach was unsuccessful.  Plan:CCM LCSW will wait for return call. If no return call is received, Will route chart to Care Guide to see if patient would like to reschedule phone appointment   Review of patient status, including review of consultants reports, relevant laboratory and other test results, and collaboration with appropriate care team members and the patient's provider was performed as part of comprehensive patient evaluation and provision of care management services.     Jenel Lucks, MSW, LCSW [facility] Care Management [place]  [facility] [email or web address] Phone [phone] 4:58 PM

## 2021-04-17 NOTE — Progress Notes (Deleted)
Cardiology Office Note  Date:  04/17/2021   ID:  Bonnie Ochoa, DOB 1966-02-11, MRN [id number]  PCP:  Valerie Roys, DO   No chief complaint on file.   HPI:  [name] is a 55 yo woman with PMH Medical and appt noncompliance, Substance abuse-chronic /marijuana bipolar,  anxiety diabetes, hypertension,  asthma,  Obstructive sleep apnea, restless leg syndrome Nonischemic cardiomyopathy,  history of ICD,  ejection fraction 25%, 05/2018 Normal coronary artery, (patient denies any coronary disease on prior cardiac catheterization) anemia Previous alcohol problem  ascites, cirrhosis, History of paracentesis Previously on pain medication at home, oxycodone She presents today for follow-up of her chronic systolic CHF  Visit with Christell Faith in 5/21 and Entresto decreased to 24/26 bid due to low BP Echo 7/21 EF 20-25% RV ok.  Followed in the advanced heart failure clinic, seen 2 months ago - Stable NYHA III-IIIb  LOV 9/21 Weight down 35 pounds from 2019  Labs reviewed HGBA1C 12.7 to 6.8  Total chol 303 to 152 LDL 84  ECHO 07/2020 Ef 25 to 30-%  No significant leg swelling, no abdominal bloating, does not feel she has extra fluid  Active, walking ,>10 minutes endurance  meds as listed below spironolactone to 25 mg daily   carvedilol 12.5 mg twice a day.  digoxin 0.125 mg daily.  entresto 49/51 bid mg twice a day.  EKG personally reviewed by myself on todays visit Shows normal sinus rhythm rate 95 bpm nonspecific T wave abnormality V6, 1 aVL Unchanged  Other past medical history reviewed hospitalization October 25, 2018 for renal failure CR 2.15 Felt to be secondary to dehydration and improved with IV fluids She was continued on Lasix at discharge   Other past medical history reviewed Previous hospitalization November 2018 for hyperglycemia nonketotic coma She had acute renal failure and hyponatremia  Hospital admission 09/10/16 numerous sx on arrival to  ER chest pain, shortness of breath, abdominal pain, dysuria, urinary frequency, syncope 2 today with lightheadedness, nausea and vomiting, and diarrhea , sob TBili 2.4, had paracentesis, diuresis D/c on lasix 60 BID   Hospital admission 09/17/16: chest pain, ABD pain fevers, chills, chest pain, shortness of breath, vomiting and diarrhea. "ran out of meds" HTN, acute on chronic systolic CHF   Seen in the emergency room 05/19/2016 for abdominal pain, chest pain BNP in the hospital 3700  acute on chronic CHF, Had 20 L diuresis   CT scan consistent with moderate abdominal ascites, fatty liver Aortic atherosclerosis   PMH:   has a past medical history of Abdominal pain, ADHD, AICD (automatic cardioverter/defibrillator) present, Anemia, Arthritis, Asthma, Bipolar 1 disorder ([place]), Chest pain, CHF (congestive heart failure) ([place]), Cirrhosis of liver ([place]), Coronary artery disease, Depression, Diabetes mellitus without complication ([place]), Diverticulitis, Dysrhythmia, Heart murmur, HFrEF (heart failure with reduced ejection fraction) ([place]), Hypertension, Hypothyroidism, IBS (irritable bowel syndrome), Insomnia, Migraines, NICM (nonischemic cardiomyopathy) ([place]), OSA (obstructive sleep apnea), PTSD (post-traumatic stress disorder), Restless leg syndrome, and Vertigo.  PSH:    Past Surgical History:  Procedure Laterality Date   ABDOMINAL HYSTERECTOMY     CARDIAC CATHETERIZATION     CATARACT EXTRACTION W/PHACO Right 01/27/2019   Procedure: CATARACT EXTRACTION PHACO AND INTRAOCULAR LENS PLACEMENT ([place])  VISION BLUE RIGHT DIABETES;  Surgeon: Birder Robson, MD;  Location: [facility];  Service: Ophthalmology;  Laterality: Right;  Diabetes - insulin sleep apnea   CATARACT EXTRACTION W/PHACO Left 03/08/2020   Procedure: CATARACT EXTRACTION PHACO AND INTRAOCULAR LENS PLACEMENT (IOC) LEFT DIABETIC  10.28  00:57.8;  Surgeon: Birder Robson, MD;  Location: [facility];  Service:  Ophthalmology;  Laterality: Left;   COLONOSCOPY WITH PROPOFOL N/A 03/11/2019   Procedure: COLONOSCOPY WITH PROPOFOL;  Surgeon: Virgel Manifold, MD;  Location: [facility];  Service: Endoscopy;  Laterality: N/A;   CORONARY ARTERY BYPASS GRAFT     Pt denies   ESOPHAGOGASTRODUODENOSCOPY (EGD) WITH PROPOFOL N/A 03/11/2019   Procedure: ESOPHAGOGASTRODUODENOSCOPY (EGD) WITH PROPOFOL;  Surgeon: Virgel Manifold, MD;  Location: [facility];  Service: Endoscopy;  Laterality: N/A;   INSERT / REPLACE / REMOVE PACEMAKER     ICD   INSERTION OF ICD  2016   St Jude Single chamber ICD   RIGHT/LEFT HEART CATH AND CORONARY ANGIOGRAPHY N/A 06/09/2018   Procedure: RIGHT/LEFT HEART CATH AND CORONARY ANGIOGRAPHY;  Surgeon: Wellington Hampshire, MD;  Location: [facility];  Service: Cardiovascular;  Laterality: N/A;   TONSILLECTOMY     TUBAL LIGATION  1980    Current Outpatient Medications  Medication Sig Dispense Refill   blood glucose meter kit and supplies KIT Dispense based on patient and insurance preference. Use up to four times daily as directed. (FOR ICD-9 250.00, 250.01). 1 each 0   carvedilol (COREG) 12.5 MG tablet Take 1 tablet (12.5 mg total) by mouth 2 (two) times daily with a meal. 180 tablet 3   cyclobenzaprine (FLEXERIL) 5 MG tablet TAKE 1 TABLET BY MOUTH THREE TIMES A DAY AS NEEDED FOR MUSCLE SPASMS 60 tablet 1   digoxin (LANOXIN) 0.125 MG tablet TAKE 1 TABLET BY MOUTH DAILY 90 tablet 3   famotidine (PEPCID) 40 MG tablet TAKE 1 TABLET BY MOUTH EVERY DAY IN THE EVENING 90 tablet 0   furosemide (LASIX) 20 MG tablet Take 1 tablet (20 mg total) by mouth as directed. Take 14m (1 tablet) Monday, Wednesday, Friday. 40 tablet 3   insulin glargine (LANTUS SOLOSTAR) 100 UNIT/ML Solostar Pen Inject 40 Units into the skin daily at 10 pm. 15 mL 1   Insulin Pen Needle 32G X 6 MM MISC 1 each by Does not apply route daily. 100 each 12   JARDIANCE 25 MG TABS tablet TAKE 1 TABLET BY MOUTH  EVERY DAY 90 tablet 1   lamoTRIgine (LAMICTAL) 150 MG tablet TAKE 1 TABLET BY MOUTH EVERY DAY 30 tablet 0   metoCLOPramide (REGLAN) 10 MG tablet Take 10 mg by mouth 4 (four) times daily.     pantoprazole (PROTONIX) 40 MG tablet TAKE 1 TABLET BY MOUTH EVERY DAY 90 tablet 1   PARoxetine (PAXIL) 30 MG tablet Take 1 tablet (30 mg total) by mouth daily. 90 tablet 1   potassium chloride SA (KLOR-CON M20) 20 MEQ tablet TAKE 2 TABLETS (40 MEQ TOTAL) BY MOUTH 2 (TWO) TIMES DAILY. 180 tablet 3   risperiDONE (RISPERDAL) 0.25 MG tablet TAKE 1 TABLET (0.25 MG TOTAL) BY MOUTH EVERY MORNING. 90 tablet 1   rosuvastatin (CRESTOR) 40 MG tablet Take 1 tablet (40 mg total) by mouth daily. 90 tablet 3   sacubitril-valsartan (ENTRESTO) 97-103 MG Take 1 tablet by mouth 2 (two) times daily. 60 tablet 11   spironolactone (ALDACTONE) 25 MG tablet Take 1 tablet (25 mg total) by mouth daily. 90 tablet 3   sucralfate (CARAFATE) 1 g tablet TAKE 1 TABLET (1 G TOTAL) BY MOUTH 4 (FOUR) TIMES DAILY. 360 tablet 3   zolpidem (AMBIEN) 10 MG tablet TAKE 1 TABLET BY MOUTH AT BEDTIME AS NEEDED FOR SLEEP. 21 tablet 0  No current facility-administered medications for this visit.     Allergies:   Levothyroxine   Social History:  The patient  reports that she quit smoking about 37 years ago. Her smoking use included cigarettes. She has never used smokeless tobacco. She reports that she does not currently use alcohol. She reports current drug use. Frequency: 1.00 time per week. Drug: Marijuana.   Family History:   family history includes Alzheimer's disease in her maternal grandfather; Asthma in her sister and sister; Breast cancer in her paternal aunt; Cancer in her sister; Diabetes in her sister and sister; Heart disease in her mother and sister; Hyperlipidemia in her brother and mother; Hypertension in her mother, sister, and sister.    Review of Systems: Review of Systems  Constitutional: Negative.   HENT: Negative.     Respiratory: Negative.    Cardiovascular: Negative.   Gastrointestinal: Negative.   Musculoskeletal: Negative.   Neurological: Negative.   Psychiatric/Behavioral: Negative.    All other systems reviewed and are negative.  PHYSICAL EXAM: VS:  There were no vitals taken for this visit. , BMI There is no height or weight on file to calculate BMI.  Constitutional:  oriented to person, place, and time. No distress.  HENT:  Head: Grossly normal Eyes:  no discharge. No scleral icterus.  Neck: No JVD, no carotid bruits  Cardiovascular: Regular rate and rhythm, no murmurs appreciated Pulmonary/Chest: Clear to auscultation bilaterally, no wheezes or rails Abdominal: Soft.  no distension.  no tenderness.  Musculoskeletal: Normal range of motion Neurological:  normal muscle tone. Coordination normal. No atrophy Skin: Skin warm and dry Psychiatric: normal affect, pleasant   Recent Labs: 01/10/2021: ALT 12; Hemoglobin 13.9; Platelets 208; TSH 1.860 01/18/2021: B Natriuretic Peptide 39.1 01/31/2021: BUN 18; Creatinine, Ser 1.37; Potassium 4.0; Sodium 140    Lipid Panel Lab Results  Component Value Date   CHOL 188 01/10/2021   HDL 35 (L) 01/10/2021   LDLCALC 93 01/10/2021   TRIG 359 (H) 01/10/2021      Wt Readings from Last 3 Encounters:  04/13/21 194 lb (88 kg)  02/15/21 189 lb 8 oz (86 kg)  01/18/21 184 lb 6.4 oz (83.6 kg)     ASSESSMENT AND PLAN:  Problem List Items Addressed This Visit   None Nonischemic cardiomyopathy EF with improvement 25 to 30% Medication compliant here Continue current meds, BP low, will not increase the doses Dig level borderline elevated, stable past 2 checks, will continue for now  Diabetes type 2 with complications Hemoglobin A1c dramatically improved, now <7  Bipolar 1 disorder/PTSD Followed by PMD Doing well   Cirrhosis Followed by GI,  As on previous visits we have recommended alcohol cessation    Total encounter time more than 25  minutes  Greater than 50% was spent in counseling and coordination of care with the patient    Signed, Esmond Plants, M.D., Ph.D. [place], [place]

## 2021-04-18 ENCOUNTER — Ambulatory Visit: Payer: Medicare Other | Admitting: Cardiovascular Disease

## 2021-04-18 DIAGNOSIS — I10 Essential (primary) hypertension: Secondary | ICD-10-CM

## 2021-04-18 DIAGNOSIS — E1165 Type 2 diabetes mellitus with hyperglycemia: Secondary | ICD-10-CM

## 2021-04-18 DIAGNOSIS — G4733 Obstructive sleep apnea (adult) (pediatric): Secondary | ICD-10-CM

## 2021-04-18 DIAGNOSIS — I5022 Chronic systolic (congestive) heart failure: Secondary | ICD-10-CM

## 2021-04-18 DIAGNOSIS — Z95810 Presence of automatic (implantable) cardiac defibrillator: Secondary | ICD-10-CM

## 2021-04-18 DIAGNOSIS — I428 Other cardiomyopathies: Secondary | ICD-10-CM

## 2021-04-18 DIAGNOSIS — N182 Chronic kidney disease, stage 2 (mild): Secondary | ICD-10-CM

## 2021-04-19 ENCOUNTER — Telehealth: Payer: Self-pay | Admitting: Licensed Clinical Social Worker

## 2021-04-19 NOTE — Telephone Encounter (Signed)
    Clinical Social Work  Chronic Care Management   Phone Outreach    04/19/2021 Name: Narely Nobles MRN: [id number] DOB: 07-27-1966  Deandre Stansel is a 55 y.o. year old female who is a primary care patient of Dorcas Carrow, DO .   Reason for referral: Mental Health Counseling and Resources.    CCM LCSW reached out to patient today by phone to introduce self, assess needs and offer Care Management services and interventions.     Plan: Patient scheduled initial appointment for 05/04/21  Review of patient status, including review of consultants reports, relevant laboratory and other test results, and collaboration with appropriate care team members and the patient's provider was performed as part of comprehensive patient evaluation and provision of care management services.    Jenel Lucks, MSW, LCSW [facility] Care [facility] [place]  [facility] [email or web address] Phone [phone] 1:29 PM

## 2021-05-04 ENCOUNTER — Telehealth: Payer: Medicare Other

## 2021-05-05 ENCOUNTER — Telehealth: Payer: Self-pay | Admitting: Licensed Clinical Social Worker

## 2021-05-05 NOTE — Telephone Encounter (Signed)
    Clinical Social Work  Care Management   Phone Outreach    05/05/2021 Name: Bonnie Ochoa MRN: [id number] DOB: Jul 08, 1966  Bonnie Ochoa is a 55 y.o. year old female who is a primary care patient of Dorcas Carrow, DO .   Reason for referral: Mental Health Counseling and Resources.    F/U phone call today to assess needs, progress and barriers with care plan goals.   Telephone outreach was unsuccessful. A HIPPA compliant phone message was left for the patient providing contact information and requesting a return call.   Plan:CCM LCSW will wait for return call. If no return call is received, Will reach out to patient again in the next 30 .   Review of patient status, including review of consultants reports, relevant laboratory and other test results, and collaboration with appropriate care team members and the patient's provider was performed as part of comprehensive patient evaluation and provision of care management services.     Jenel Lucks, MSW, LCSW [facility] Care Management [place]  [facility] [email or web address] Phone [phone] 11:25 AM

## 2021-05-09 ENCOUNTER — Encounter: Payer: Medicare Other | Admitting: Internal Medicine

## 2021-05-09 DIAGNOSIS — I428 Other cardiomyopathies: Secondary | ICD-10-CM

## 2021-05-09 DIAGNOSIS — Z95810 Presence of automatic (implantable) cardiac defibrillator: Secondary | ICD-10-CM

## 2021-05-09 DIAGNOSIS — I10 Essential (primary) hypertension: Secondary | ICD-10-CM

## 2021-05-09 DIAGNOSIS — I5022 Chronic systolic (congestive) heart failure: Secondary | ICD-10-CM

## 2021-05-10 ENCOUNTER — Encounter: Payer: Self-pay | Admitting: Internal Medicine

## 2021-05-12 LAB — HM DIABETES EYE EXAM

## 2021-05-18 ENCOUNTER — Ambulatory Visit (INDEPENDENT_AMBULATORY_CARE_PROVIDER_SITE_OTHER): Payer: Medicare Other | Admitting: Family Medicine

## 2021-05-18 ENCOUNTER — Encounter: Payer: Self-pay | Admitting: Family Medicine

## 2021-05-18 ENCOUNTER — Other Ambulatory Visit: Payer: Self-pay

## 2021-05-18 VITALS — BP 72/50 | HR 76 | Temp 98.5°F | Resp 16 | Ht 65.0 in | Wt 191.6 lb

## 2021-05-18 DIAGNOSIS — N1831 Chronic kidney disease, stage 3a: Secondary | ICD-10-CM

## 2021-05-18 DIAGNOSIS — Z23 Encounter for immunization: Secondary | ICD-10-CM | POA: Diagnosis not present

## 2021-05-18 DIAGNOSIS — E1122 Type 2 diabetes mellitus with diabetic chronic kidney disease: Secondary | ICD-10-CM

## 2021-05-18 DIAGNOSIS — Z794 Long term (current) use of insulin: Secondary | ICD-10-CM

## 2021-05-18 DIAGNOSIS — I200 Unstable angina: Secondary | ICD-10-CM | POA: Diagnosis not present

## 2021-05-18 MED ORDER — LANTUS SOLOSTAR 100 UNIT/ML ~~LOC~~ SOPN: 45.0000 [IU] | PEN_INJECTOR | Freq: Every day | SUBCUTANEOUS | 1 refills | Status: DC

## 2021-05-18 NOTE — Progress Notes (Signed)
BP (!) 72/50 (BP Location: Left Arm, Patient Position: Sitting, Cuff Size: Large)   Pulse 76   Temp 98.5 F (36.9 C) (Oral)   Resp 16   Ht 5\' 5"  (1.651 m)   Wt 191 lb 9.6 oz (86.9 kg)   SpO2 99%   BMI 31.88 kg/m    Subjective:    Patient ID: , female    DOB: [birth year], 55 y.o.   MRN: [id number]  HPI: Bonnie Ochoa is a 55 y.o. female  Chief Complaint  Patient presents with   Diabetes   DIABETES Hypoglycemic episodes:no Polydipsia/polyuria: yes Visual disturbance: no Chest pain: no Paresthesias: no Glucose Monitoring: yes  Accucheck frequency: couple of times a week, 160s and higher Taking Insulin?: no Blood Pressure Monitoring: not checking Retinal Examination: Up to Date Foot Exam: Up to Date Diabetic Education: Completed Pneumovax: Up to Date Influenza: Up to Date Aspirin: yes   Relevant past medical, surgical, family and social history reviewed and updated as indicated. Interim medical history since our last visit reviewed. Allergies and medications reviewed and updated.  Review of Systems  Constitutional: Negative.   Respiratory: Negative.    Cardiovascular: Negative.   Gastrointestinal: Negative.   Musculoskeletal: Negative.   Psychiatric/Behavioral: Negative.     Per HPI unless specifically indicated above     Objective:    BP (!) 72/50 (BP Location: Left Arm, Patient Position: Sitting, Cuff Size: Large)   Pulse 76   Temp 98.5 F (36.9 C) (Oral)   Resp 16   Ht 5\' 5"  (1.651 m)   Wt 191 lb 9.6 oz (86.9 kg)   SpO2 99%   BMI 31.88 kg/m   Wt Readings from Last 3 Encounters:  05/18/21 191 lb 9.6 oz (86.9 kg)  04/13/21 194 lb (88 kg)  02/15/21 189 lb 8 oz (86 kg)    Physical Exam Vitals and nursing note reviewed.  Constitutional:      General: She is not in acute distress.    Appearance: Normal appearance. She is not ill-appearing, toxic-appearing or diaphoretic.  HENT:     Head: Normocephalic and atraumatic.     Right Ear:  External ear normal.     Left Ear: External ear normal.     Nose: Nose normal.     Mouth/Throat:     Mouth: Mucous membranes are moist.     Pharynx: Oropharynx is clear.  Eyes:     General: No scleral icterus.       Right eye: No discharge.        Left eye: No discharge.     Extraocular Movements: Extraocular movements intact.     Conjunctiva/sclera: Conjunctivae normal.     Pupils: Pupils are equal, round, and reactive to light.  Cardiovascular:     Rate and Rhythm: Normal rate and regular rhythm.     Pulses: Normal pulses.     Heart sounds: Normal heart sounds. No murmur heard.   No friction rub. No gallop.  Pulmonary:     Effort: Pulmonary effort is normal. No respiratory distress.     Breath sounds: Normal breath sounds. No stridor. No wheezing, rhonchi or rales.  Chest:     Chest wall: No tenderness.  Musculoskeletal:        General: Normal range of motion.     Cervical back: Normal range of motion and neck supple.  Skin:    General: Skin is warm and dry.     Capillary Refill: Capillary refill takes less than  2 seconds.     Coloration: Skin is not jaundiced or pale.     Findings: No bruising, erythema, lesion or rash.  Neurological:     General: No focal deficit present.     Mental Status: She is alert and oriented to person, place, and time. Mental status is at baseline.  Psychiatric:        Mood and Affect: Mood normal.        Behavior: Behavior normal.        Thought Content: Thought content normal.        Judgment: Judgment normal.    Results for orders placed or performed in visit on 04/13/21  Bayer DCA Hb A1c Waived  Result Value Ref Range   HB A1C (BAYER DCA - WAIVED) 9.5 (H) <7.0 %      Assessment & Plan:   Problem List Items Addressed This Visit       Endocrine   DM (diabetes mellitus), type 2 with renal complications (HCC) - Primary    Still running high. Will increase her lantus to 45units and recheck 2 months. Call with any concerns.       Relevant Medications   insulin glargine (LANTUS SOLOSTAR) 100 UNIT/ML Solostar Pen   Other Visit Diagnoses     Flu vaccine need       Relevant Orders   Flu Vaccine QUAD 6+ mos PF IM (Fluarix Quad PF) (Completed)        Follow up plan: Return in about 2 months (around 07/18/2021).

## 2021-05-18 NOTE — Assessment & Plan Note (Signed)
Still running high. Will increase her lantus to 45units and recheck 2 months. Call with any concerns.

## 2021-06-01 ENCOUNTER — Ambulatory Visit (INDEPENDENT_AMBULATORY_CARE_PROVIDER_SITE_OTHER): Payer: Medicare Other | Admitting: Licensed Clinical Social Worker

## 2021-06-01 DIAGNOSIS — G4733 Obstructive sleep apnea (adult) (pediatric): Secondary | ICD-10-CM

## 2021-06-01 DIAGNOSIS — F4312 Post-traumatic stress disorder, chronic: Secondary | ICD-10-CM

## 2021-06-01 DIAGNOSIS — F5105 Insomnia due to other mental disorder: Secondary | ICD-10-CM

## 2021-06-01 DIAGNOSIS — F3132 Bipolar disorder, current episode depressed, moderate: Secondary | ICD-10-CM

## 2021-06-01 DIAGNOSIS — K7469 Other cirrhosis of liver: Secondary | ICD-10-CM

## 2021-06-01 DIAGNOSIS — F410 Panic disorder [episodic paroxysmal anxiety] without agoraphobia: Secondary | ICD-10-CM

## 2021-06-01 DIAGNOSIS — F1210 Cannabis abuse, uncomplicated: Secondary | ICD-10-CM

## 2021-06-01 DIAGNOSIS — Z794 Long term (current) use of insulin: Secondary | ICD-10-CM

## 2021-06-01 DIAGNOSIS — N1831 Chronic kidney disease, stage 3a: Secondary | ICD-10-CM

## 2021-06-02 NOTE — Chronic Care Management (AMB) (Signed)
Chronic Care Management    Clinical Social Work Note  06/02/2021 Name: Bonnie Ochoa MRN: [id number] DOB: May 16, 1966  Bonnie Ochoa is a 55 y.o. year old female who is a primary care patient of Bonnie Roys, DO. The CCM team was consulted to assist the patient with chronic disease management and/or care coordination needs related to: Transportation Needs , Food Insecurity, Mental Health Counseling and Resources, and Grief Counseling.   Engaged with patient by telephone for initial visit in response to provider referral for social work chronic care management and care coordination services.   Consent to Services:  The patient was given the following information about Chronic Care Management services today, agreed to services, and gave verbal consent: 1. CCM service includes personalized support from designated clinical staff supervised by the primary care provider, including individualized plan of care and coordination with other care providers 2. 24/7 contact phone numbers for assistance for urgent and routine care needs. 3. Service will only be billed when office clinical staff spend 20 minutes or more in a month to coordinate care. 4. Only one practitioner may furnish and bill the service in a calendar month. 5.The patient may stop CCM services at any time (effective at the end of the month) by phone call to the office staff. 6. The patient will be responsible for cost sharing (co-pay) of up to 20% of the service fee (after annual deductible is met). Patient agreed to services and consent obtained.  Patient agreed to services and consent obtained.   Consent to Services:  The patient was given information about Care Management services, agreed to services, and gave verbal consent prior to initiation of services.  Please see initial visit note for detailed documentation.   Patient agreed to services today and consent obtained.  Engaged with patient by phone in response to provider referral  for social work care coordination services:  Assessment/Interventions: Assessed patient's previous and current treatment, coping skills, support system and barriers to care. Patient is experiencing financial strain resulting in unstable transportation to medical appointments and food insecurity. Resources were provided, referrals completed, and strategies discussed to assist with management of symptoms  See Care Plan below for interventions and patient self-care activities.  Recent life changes or stressors: Psychosocial stressors  Recommendation: Patient may benefit from, and is in agreement work with LCSW to address care coordination needs and will continue to work with the clinical team to address health care and disease management related needs.   Follow up Plan: Patient would like continued follow-up from CCM LCSW .  per patient's request will follow up in 06/19/21.  Will call office if needed prior to next encounter.     SDOH (Social Determinants of Health) assessments and interventions performed:  Landscape architect, Transportation  Advanced Directives Status: Not addressed in this encounter.  CCM Care Plan  Allergies  Allergen Reactions   Levothyroxine Rash    Outpatient Encounter Medications as of 06/01/2021  Medication Sig   blood glucose meter kit and supplies KIT Dispense based on patient and insurance preference. Use up to four times daily as directed. (FOR ICD-9 250.00, 250.01).   carvedilol (COREG) 12.5 MG tablet Take 1 tablet (12.5 mg total) by mouth 2 (two) times daily with a meal.   cyclobenzaprine (FLEXERIL) 5 MG tablet TAKE 1 TABLET BY MOUTH THREE TIMES A DAY AS NEEDED FOR MUSCLE SPASMS   digoxin (LANOXIN) 0.125 MG tablet TAKE 1 TABLET BY MOUTH DAILY   famotidine (PEPCID) 40 MG tablet TAKE 1  TABLET BY MOUTH EVERY DAY IN THE EVENING   furosemide (LASIX) 20 MG tablet Take 1 tablet (20 mg total) by mouth as directed. Take 22m (1 tablet) Monday, Wednesday, Friday.   insulin  glargine (LANTUS SOLOSTAR) 100 UNIT/ML Solostar Pen Inject 45 Units into the skin daily at 10 pm.   Insulin Pen Needle 32G X 6 MM MISC 1 each by Does not apply route daily.   JARDIANCE 25 MG TABS tablet TAKE 1 TABLET BY MOUTH EVERY DAY   lamoTRIgine (LAMICTAL) 150 MG tablet TAKE 1 TABLET BY MOUTH EVERY DAY   metoCLOPramide (REGLAN) 10 MG tablet Take 10 mg by mouth 4 (four) times daily.   pantoprazole (PROTONIX) 40 MG tablet TAKE 1 TABLET BY MOUTH EVERY DAY   PARoxetine (PAXIL) 30 MG tablet Take 1 tablet (30 mg total) by mouth daily.   potassium chloride SA (KLOR-CON M20) 20 MEQ tablet TAKE 2 TABLETS (40 MEQ TOTAL) BY MOUTH 2 (TWO) TIMES DAILY.   risperiDONE (RISPERDAL) 0.25 MG tablet TAKE 1 TABLET (0.25 MG TOTAL) BY MOUTH EVERY MORNING.   rosuvastatin (CRESTOR) 40 MG tablet Take 1 tablet (40 mg total) by mouth daily.   sacubitril-valsartan (ENTRESTO) 97-103 MG Take 1 tablet by mouth 2 (two) times daily.   spironolactone (ALDACTONE) 25 MG tablet Take 1 tablet (25 mg total) by mouth daily.   sucralfate (CARAFATE) 1 g tablet TAKE 1 TABLET (1 G TOTAL) BY MOUTH 4 (FOUR) TIMES DAILY.   zolpidem (AMBIEN) 10 MG tablet TAKE 1 TABLET BY MOUTH AT BEDTIME AS NEEDED FOR SLEEP.   No facility-administered encounter medications on file as of 06/01/2021.    Patient Active Problem List   Diagnosis Date Noted   Insomnia due to mental condition 02/16/2019   Bipolar disorder, current episode depressed, moderate ([place] 02/16/2019   Chronic post-traumatic stress disorder (PTSD) 02/16/2019   Panic disorder 02/16/2019   Cannabis use disorder, mild, abuse 02/16/2019   Unstable angina ([place] 06/07/2018   Hypothyroid 04/30/2017   Multiple thyroid nodules 008/[id number]  Chronic systolic heart failure ([place] 10/08/2016   Obstructive sleep apnea 10/08/2016   Cirrhosis ([place] 09/18/2016   Pulmonary hypertension ([place] 05/30/2016   Hypokalemia 05/30/2016   NICM (nonischemic cardiomyopathy) ([place] 05/30/2016   Benign  hypertensive renal disease    DM (diabetes mellitus), type 2 with renal complications ([place]    Asthma     Conditions to be addressed/monitored: DMII, Anxiety, Depression, Bipolar Disorder, and Asthma; Financial constraints related to psychosocial stressors, Transportation, Limited access to food, Mental Health Concerns , and Substance abuse issues -  [place]: LCSW Plan of Care  Updates made by LRebekah [place]field LCSW since 06/02/2021 12:00 AM     Problem: Symptoms of Depression and Anxiety      Long-Range Goal: Coping Skills Enhanced   Start Date: 06/01/2021  This Visit's Progress: On track  Priority: High  Note:   Current barriers:   Severe Persistent Mental Health needs related to Bipolar Disorder, PTSD, Panic Disorder Transportation, Limited access to food, Mental Health Concerns , and Substance abuse issues -  [place] Needs Support, Education, and Care Coordination in order to meet unmet mental health needs. Clinical Goal(s): demonstrate a reduction in symptoms related to :Anxiety, Depression, and Bipolar Disorder  verbalize understanding of plan for management of DMII  explore community resource options for unmet needs related to:  Transportation, Food Insecurity , and Alcohol/Substance Use  Clinical Interventions:  Assessed patient's previous and current treatment, coping skills, support  system and barriers to care  Patient picked up medication and started 45 units. Patient shared things are going well with no side effects. Patient checks BS 2-3 x weekly Patient participates in medication management through [facility]. She has follow up appts every 3 months Patient endorses difficulty with stable transportation. As a result, patient has had to cancel appointments because she is unable to afford to fix vehicle. CCM LCSW discussed [place] and patient provided verbal consent for to complete a referral Patient reports food insecurity, which  negatively impacts her ability to manage medical conditions (DMII) She shared that her SNAP benefits for discontinued after patient's social security increased slightly. CCM LCSW completed referral to care guide for assistance CCM LCSW discussed strategies to assist with stress management and self-care. Patient was successful at identifying healthy coping skills   Patient reports that depression and anxiety are triggered by grief of mother and often symptoms "go up and down" They include fluctuating appetite, difficulty sleeping, and overwhelming feelings of sadness. She denies SI/HI CCM LCSW provided validation and encouragement. CCM LCSW strongly encouraged patient to identify warning signs to when symptoms are unmanaged, in addition, to strategies to positively impact mood. Patient receives strong support from wife.  CCM LCSW discussed grief support resources. Patient provided consent for CCM LCSW to send information on [facility]  Patient reports difficulty managing joint pain in fingers, elbows, knees, and ankles. She obtains relief from OTC medicines (Advil and Aleve) Depression screen reviewed  Mindfulness or Relaxation training provided Active listening / Reflection utilized  Emotional Support Provided Provided psychoeducation for mental health needs  Provided brief CBT  Quality of sleep assessed & Sleep Hygiene techniques promoted  Participation in support group encouraged  Verbalization of feelings encouraged  Crisis Resource Education / information provided  Suicidal Ideation/Homicidal Ideation assessed: Patient denies SI/HI Made referral to Care Guide to assist with Food Insecurity  Review various resources, discussed options and provided patient information about  [facility]  Referral to care guide (Food Insecurity)  1:1 collaboration with primary care provider regarding development and update of comprehensive plan of care as evidenced by provider attestation and  co-signature Inter-disciplinary care team collaboration (see longitudinal plan of care) Patient Goals/Self-Care Activities: Over the next 120 days Call [employer] [phone]  Continue with compliance of taking medication  Collaborate with the community resource care guide Increase coping skills, healthy habits, and self-management skills Follow up with Manufacturing engineer for grief support        Christa See, MSW, [email or web address] Phone [phone] 10:01 AM

## 2021-06-05 NOTE — Patient Instructions (Signed)
Visit Information   Goals Addressed             This Visit's Progress    Management of MH symptoms   On track    Timeframe:  Long-Range Goal Priority:  High Start Date:            06/01/21                 Expected End Date:      09/12/21                 Follow Up Date 06/19/21   Patient Goals/Self-Care Activities: Over the next 120 days Call [employer] [phone]  Continue with compliance of taking medication  Collaborate with the community resource care guide Increase coping skills, healthy habits, and self-management skills Follow up with Civil engineer, contracting for grief support        Patient verbalizes understanding of instructions provided today and agrees to view in [place].   Telephone follow up appointment with care management team member scheduled for:06/19/21  Jenel Lucks, MSW, LCSW [facility] Care Management [facility]  [facility] [email or web address] Phone [phone] 5:54 AM

## 2021-06-12 ENCOUNTER — Telehealth: Payer: Self-pay

## 2021-06-12 DIAGNOSIS — E1122 Type 2 diabetes mellitus with diabetic chronic kidney disease: Secondary | ICD-10-CM

## 2021-06-12 DIAGNOSIS — Z794 Long term (current) use of insulin: Secondary | ICD-10-CM | POA: Diagnosis not present

## 2021-06-12 DIAGNOSIS — F3132 Bipolar disorder, current episode depressed, moderate: Secondary | ICD-10-CM

## 2021-06-12 DIAGNOSIS — N1831 Chronic kidney disease, stage 3a: Secondary | ICD-10-CM | POA: Diagnosis not present

## 2021-06-12 NOTE — Telephone Encounter (Signed)
   Telephone encounter was:  Unsuccessful.  06/12/2021 Name: Bonnie Ochoa MRN: [id number] DOB: 1965/10/13  Unsuccessful outbound call made today to assist with:  Left message on voicemail for patient to return my call regarding community resources needed for transportation and food.  Outreach Attempt:  1st Attempt  A HIPAA compliant voice message was left requesting a return call.  Instructed patient to call back at [phone].  Jahniya Duzan, AAS Paralegal, [facility] Care Guide  Embedded Care Coordination [place]  Care Management  [address] [place], [place] [address] [email or web address]  ?? [phone]   [email or web address]

## 2021-06-13 ENCOUNTER — Other Ambulatory Visit: Payer: Self-pay | Admitting: Family Medicine

## 2021-06-13 NOTE — Telephone Encounter (Signed)
Requested Prescriptions  Pending Prescriptions Disp Refills  . famotidine (PEPCID) 40 MG tablet [Pharmacy Med Name: FAMOTIDINE 40 MG TABLET] 90 tablet 0    Sig: TAKE 1 TABLET BY MOUTH EVERY DAY IN THE EVENING     Gastroenterology:  H2 Antagonists Passed - 06/13/2021  2:20 PM      Passed - Valid encounter within last 12 months    Recent Outpatient Visits          3 weeks ago Type 2 diabetes mellitus with stage 3a chronic kidney disease, with long-term current use of insulin (HCC)   [facility] [place], Megan P, DO   2 months ago Type 2 diabetes mellitus with stage 3a chronic kidney disease, with long-term current use of insulin (HCC)   [facility] [place], Megan P, DO   5 months ago Chest pain, unspecified type   [facility], Megan P, DO   11 months ago Type 2 diabetes mellitus with stage 3a chronic kidney disease, with long-term current use of insulin (HCC)   [facility] [place], Megan P, DO   1 year ago Type 2 diabetes mellitus with stage 3a chronic kidney disease, with long-term current use of insulin (HCC)   [facility] [place], [place], DO      Future Appointments            In 1 month Johnson, Oralia Rud, DO [facility], [facility]   In 3 months  [employer], [facility]

## 2021-06-14 ENCOUNTER — Telehealth: Payer: Self-pay | Admitting: Licensed Clinical Social Worker

## 2021-06-14 NOTE — Telephone Encounter (Signed)
    Clinical Social Work  Care Management   Phone Outreach    06/14/2021 Name: Bonnie Ochoa MRN: [id number] DOB: 1966-05-28  Bonnie Ochoa is a 55 y.o. year old female who is a primary care patient of Dorcas Carrow, DO .   Reason for referral: Mental Health Counseling and Resources.    F/U phone call today to assess needs, progress and barriers with care plan goals.   Telephone outreach was unsuccessful. Unable to leave a HIPPA compliant phone message due to voice mail not set up.  Plan:CCM LCSW will wait for return call.  Review of patient status, including review of consultants reports, relevant laboratory and other test results, and collaboration with appropriate care team members and the patient's provider was performed as part of comprehensive patient evaluation and provision of care management services.    Jenel Lucks, MSW, LCSW [facility] Care Management [place]  [facility] [email or web address] Phone [phone] 10:02 AM

## 2021-06-19 ENCOUNTER — Ambulatory Visit (INDEPENDENT_AMBULATORY_CARE_PROVIDER_SITE_OTHER): Payer: Medicare Other | Admitting: Licensed Clinical Social Worker

## 2021-06-19 DIAGNOSIS — E1122 Type 2 diabetes mellitus with diabetic chronic kidney disease: Secondary | ICD-10-CM

## 2021-06-19 DIAGNOSIS — F3132 Bipolar disorder, current episode depressed, moderate: Secondary | ICD-10-CM

## 2021-06-19 DIAGNOSIS — F4312 Post-traumatic stress disorder, chronic: Secondary | ICD-10-CM

## 2021-06-19 DIAGNOSIS — F1210 Cannabis abuse, uncomplicated: Secondary | ICD-10-CM

## 2021-06-19 DIAGNOSIS — F5105 Insomnia due to other mental disorder: Secondary | ICD-10-CM

## 2021-06-19 DIAGNOSIS — K7469 Other cirrhosis of liver: Secondary | ICD-10-CM

## 2021-06-19 DIAGNOSIS — Z794 Long term (current) use of insulin: Secondary | ICD-10-CM

## 2021-06-19 DIAGNOSIS — F410 Panic disorder [episodic paroxysmal anxiety] without agoraphobia: Secondary | ICD-10-CM

## 2021-06-20 NOTE — Patient Instructions (Signed)
Visit Information  Patient verbalizes understanding of instructions provided today and agrees to view in MyChart.   Telephone follow up appointment with care management team member scheduled for:08/10/21  Jenel Lucks, MSW, LCSW [facility] Care Management [facility]  [facility] [email or web address] Phone [phone] 7:56 AM

## 2021-06-20 NOTE — Chronic Care Management (AMB) (Signed)
Chronic Care Management    Clinical Social Work Note  06/20/2021 Name: Bonnie Ochoa MRN: [id number] DOB: 23-Mar-1966  Bonnie Ochoa is a 55 y.o. year old female who is a primary care patient of Valerie Roys, DO. The CCM team was consulted to assist the patient with chronic disease management and/or care coordination needs related to: Mental Health Counseling and Resources.   Engaged with patient by telephone for follow up visit in response to provider referral for social work chronic care management and care coordination services.   Consent to Services:  The patient was given information about Chronic Care Management services, agreed to services, and gave verbal consent prior to initiation of services.  Please see initial visit note for detailed documentation.   Patient agreed to services and consent obtained.   Consent to Services:  The patient was given information about Care Management services, agreed to services, and gave verbal consent prior to initiation of services.  Please see initial visit note for detailed documentation.   Patient agreed to services today and consent obtained.  Engaged with patient by phone in response to provider referral for social work care coordination services:  Assessment/Interventions:  Patient continues to maintain positive progress with care plan goals. She continues to comply with med management, in addition, to healthy coping skills. Patient endorses onset of back pain, which she utilizes OTC medication to alleviate. Patient was encouraged to contact Care Guide for supportive resources regarding transportation and food insecurity.  See Care Plan below for interventions and patient self-care activities.  Recent life changes or stressors: Management of pain and grief  Recommendation: Patient may benefit from, and is in agreement work with LCSW to address care coordination needs and will continue to work with the clinical team to address health care  and disease management related needs.   Follow up Plan: Patient would like continued follow-up from CCM LCSW .  per patient's request will follow up in 08/10/21.  Will call office if needed prior to next encounter.  SDOH (Social Determinants of Health) assessments and interventions performed:    Advanced Directives Status: Not addressed in this encounter.  CCM Care Plan  Allergies  Allergen Reactions   Levothyroxine Rash    Outpatient Encounter Medications as of 06/19/2021  Medication Sig   blood glucose meter kit and supplies KIT Dispense based on patient and insurance preference. Use up to four times daily as directed. (FOR ICD-9 250.00, 250.01).   carvedilol (COREG) 12.5 MG tablet Take 1 tablet (12.5 mg total) by mouth 2 (two) times daily with a meal.   cyclobenzaprine (FLEXERIL) 5 MG tablet TAKE 1 TABLET BY MOUTH THREE TIMES A DAY AS NEEDED FOR MUSCLE SPASMS   digoxin (LANOXIN) 0.125 MG tablet TAKE 1 TABLET BY MOUTH DAILY   famotidine (PEPCID) 40 MG tablet TAKE 1 TABLET BY MOUTH EVERY DAY IN THE EVENING   furosemide (LASIX) 20 MG tablet Take 1 tablet (20 mg total) by mouth as directed. Take 80m (1 tablet) Monday, Wednesday, Friday.   insulin glargine (LANTUS SOLOSTAR) 100 UNIT/ML Solostar Pen Inject 45 Units into the skin daily at 10 pm.   Insulin Pen Needle 32G X 6 MM MISC 1 each by Does not apply route daily.   JARDIANCE 25 MG TABS tablet TAKE 1 TABLET BY MOUTH EVERY DAY   lamoTRIgine (LAMICTAL) 150 MG tablet TAKE 1 TABLET BY MOUTH EVERY DAY   metoCLOPramide (REGLAN) 10 MG tablet Take 10 mg by mouth 4 (four) times daily.  pantoprazole (PROTONIX) 40 MG tablet TAKE 1 TABLET BY MOUTH EVERY DAY   PARoxetine (PAXIL) 30 MG tablet Take 1 tablet (30 mg total) by mouth daily.   potassium chloride SA (KLOR-CON M20) 20 MEQ tablet TAKE 2 TABLETS (40 MEQ TOTAL) BY MOUTH 2 (TWO) TIMES DAILY.   risperiDONE (RISPERDAL) 0.25 MG tablet TAKE 1 TABLET (0.25 MG TOTAL) BY MOUTH EVERY MORNING.    rosuvastatin (CRESTOR) 40 MG tablet Take 1 tablet (40 mg total) by mouth daily.   sacubitril-valsartan (ENTRESTO) 97-103 MG Take 1 tablet by mouth 2 (two) times daily.   spironolactone (ALDACTONE) 25 MG tablet Take 1 tablet (25 mg total) by mouth daily.   sucralfate (CARAFATE) 1 g tablet TAKE 1 TABLET (1 G TOTAL) BY MOUTH 4 (FOUR) TIMES DAILY.   zolpidem (AMBIEN) 10 MG tablet TAKE 1 TABLET BY MOUTH AT BEDTIME AS NEEDED FOR SLEEP.   No facility-administered encounter medications on file as of 06/19/2021.    Patient Active Problem List   Diagnosis Date Noted   Insomnia due to mental condition 02/16/2019   Bipolar disorder, current episode depressed, moderate ([place]) 02/16/2019   Chronic post-traumatic stress disorder (PTSD) 02/16/2019   Panic disorder 02/16/2019   Cannabis use disorder, mild, abuse 02/16/2019   Unstable angina ([place]) 06/07/2018   Hypothyroid 04/30/2017   Multiple thyroid nodules 31/54/0086   Chronic systolic heart failure ([place]) 10/08/2016   Obstructive sleep apnea 10/08/2016   Cirrhosis ([place]) 09/18/2016   Pulmonary hypertension ([place]) 05/30/2016   Hypokalemia 05/30/2016   NICM (nonischemic cardiomyopathy) ([place]) 05/30/2016   Benign hypertensive renal disease    DM (diabetes mellitus), type 2 with renal complications ([place])    Asthma     Conditions to be addressed/monitored: Bipolar Disorder and PTSD, Cannabis Use Disorder ; Mental Health Concerns   Care Plan : LCSW Plan of Care  Updates made by Rebekah Chesterfield, LCSW since 06/20/2021 12:00 AM     Problem: Symptoms of Depression and Anxiety      Long-Range Goal: Coping Skills Enhanced   Start Date: 06/01/2021  This Visit's Progress: On track  Recent Progress: On track  Priority: High  Note:   Current barriers:   Severe Persistent Mental Health needs related to Bipolar Disorder, PTSD, Panic Disorder Transportation, Limited access to food, Mental Health Concerns , and Substance abuse issues -  [place] Needs  Support, Education, and Care Coordination in order to meet unmet mental health needs. Clinical Goal(s): demonstrate a reduction in symptoms related to :Anxiety, Depression, and Bipolar Disorder  verbalize understanding of plan for management of DMII  explore community resource options for unmet needs related to:  Transportation, Food Insecurity , and Alcohol/Substance Use  Clinical Interventions:  Assessed patient's previous and current treatment, coping skills, support system and barriers to care  Patient picked up medication and started 45 units. Patient shared things are going well with no side effects. Patient checks BS 2-3 x weekly 11/7: Patient continues to comply with medication management noting she may check BS once a week Patient participates in medication management through [facility]. She has follow up appts every 3 months 11/7: Patient continues to participate in services Patient endorses difficulty with stable transportation. CCM LCSW discussed [facility] and patient provided verbal consent for to complete a referral 11/7: Pt was informed that Care Guide has made an attempt to contact her. LCSW provided pt with Care Guide's callback number to initiate resources Patient reports food insecurity, which negatively impacts her ability to manage medical  conditions (DMII) She shared that her SNAP benefits for discontinued after patient's social security increased slightly. CCM LCSW completed referral to care guide for assistance CCM LCSW discussed strategies to assist with stress management and self-care. Patient was successful at identifying healthy coping skills   Patient reports that depression and anxiety are triggered by grief of mother and often symptoms "go up and down" They include fluctuating appetite, difficulty sleeping, and overwhelming feelings of sadness. She denies SI/HI CCM LCSW provided validation and encouragement. CCM LCSW strongly encouraged patient to identify  warning signs to when symptoms are unmanaged, in addition, to strategies to positively impact mood. Patient receives strong support from wife.  CCM LCSW discussed grief support resources. Patient provided consent for CCM LCSW to send information on [facility]  Patient reports difficulty managing joint pain in fingers, elbows, knees, and ankles. She obtains relief from OTC medicines (Advil and Aleve) 11/7: Patient endorses increase in back pain. Pt takes OTC medications to assist in alleviating pain Depression screen reviewed  Mindfulness or Relaxation training provided Active listening / Reflection utilized  Emotional Support Provided Provided psychoeducation for mental health needs  Provided brief CBT  Quality of sleep assessed & Sleep Hygiene techniques promoted  Participation in support group encouraged  Verbalization of feelings encouraged  Crisis Resource Education / information provided  Suicidal Ideation/Homicidal Ideation assessed: Patient denies SI/HI Made referral to Care Guide to assist with Food Insecurity  Review various resources, discussed options and provided patient information about  [facility]  Referral to care guide (Food Insecurity)  1:1 collaboration with primary care provider regarding development and update of comprehensive plan of care as evidenced by provider attestation and co-signature Inter-disciplinary care team collaboration (see longitudinal plan of care) Patient Goals/Self-Care Activities: Over the next 120 days Call [employer] [phone]  Continue with compliance of taking medication  Collaborate with the community resource care guide Increase coping skills, healthy habits, and self-management skills Follow up with Manufacturing engineer for grief support        Christa See, MSW, [email or web address] Phone [phone] 7:53 AM

## 2021-06-21 ENCOUNTER — Telehealth: Payer: Self-pay

## 2021-06-21 NOTE — Telephone Encounter (Signed)
   Telephone encounter was:  Unsuccessful.  06/21/2021 Name: Bonnie Ochoa MRN: [id number] DOB: 1966-03-20  Unsuccessful outbound call made today to assist with:  Transportation Needs  and Food Insecurity  Outreach Attempt:  2nd Attempt  A HIPAA compliant voice message was left requesting a return call.  Instructed patient to call back at [phone].  Scotty Pinder, AAS Paralegal, [facility] Care Guide  Embedded Care Coordination Eugenio Saenz  Care Management  [address] [place], [place] [address] ??[email or web address]  ?? [phone]   [email or web address]

## 2021-06-28 ENCOUNTER — Telehealth: Payer: Self-pay

## 2021-06-28 NOTE — Telephone Encounter (Signed)
   Telephone encounter was:  Successful.  06/28/2021 Name: Bonnie Ochoa MRN: [id number] DOB: [birth year]  Bonnie Ochoa is a 55 y.o. year old female who is a primary care patient of Dorcas Carrow, DO . The community resource team was consulted for assistance with Transportation Needs  and Food Insecurity  Care guide performed the following interventions: Spoke with patient submitted request for funds/food [employer] gift card. Verified patient's email address sent food pantry list and Medicaid transportation   Follow Up Plan:  Care guide will follow up with patient by phone over the next 7-10 days  Bonnie Ochoa, AAS Paralegal, [facility] Care Guide  Embedded Care Coordination [facility]  Care Management  [address] [place], [place] [address] ??[email or web address]  ?? [phone]   [email or web address]

## 2021-06-29 ENCOUNTER — Telehealth: Payer: Self-pay

## 2021-06-29 NOTE — Telephone Encounter (Signed)
   Telephone encounter was:  Successful.  06/29/2021 Name: Lerin Jech MRN: [id number] DOB: November 20, 1965  Latana Colin is a 55 y.o. year old female who is a primary care patient of Dorcas Carrow, DO . The community resource team was consulted for assistance with Food Insecurity  Care guide performed the following interventions: Spoke with patient to let her know [facility] has purchased her Food [employer] cards for $200 and she can check with the practice in a few days.  Follow Up Plan:  No further follow up planned at this time. The patient has been provided with needed resources.  Mahaley Schwering, AAS Paralegal, [facility] Care Guide  Embedded Care Coordination   Care Management  [address] [place], [place] [address] ??[email or web address]  ?? [phone]   [email or web address]

## 2021-07-03 ENCOUNTER — Telehealth: Payer: Self-pay | Admitting: Family Medicine

## 2021-07-03 NOTE — Telephone Encounter (Signed)
Pt received Assistance Card  from [employer] ( $200) and called pt to let them know it was ready to be picked up. Patient stated that she will come pick it up when she gets a ride.

## 2021-07-10 ENCOUNTER — Other Ambulatory Visit: Payer: Self-pay | Admitting: Cardiovascular Disease

## 2021-07-10 NOTE — Telephone Encounter (Signed)
Patient needs an appt for further refills, thanks !

## 2021-07-12 DIAGNOSIS — N1831 Chronic kidney disease, stage 3a: Secondary | ICD-10-CM

## 2021-07-12 DIAGNOSIS — F3132 Bipolar disorder, current episode depressed, moderate: Secondary | ICD-10-CM

## 2021-07-12 DIAGNOSIS — E1122 Type 2 diabetes mellitus with diabetic chronic kidney disease: Secondary | ICD-10-CM

## 2021-07-12 DIAGNOSIS — Z794 Long term (current) use of insulin: Secondary | ICD-10-CM

## 2021-07-18 ENCOUNTER — Ambulatory Visit: Payer: Medicare Other | Admitting: Family Medicine

## 2021-07-25 ENCOUNTER — Ambulatory Visit: Payer: Medicare Other | Admitting: Family Medicine

## 2021-07-26 NOTE — Progress Notes (Deleted)
Cardiology Office Note  Date:  07/26/2021   ID:  Bonnie Ochoa, DOB 25-Sep-1965, MRN [id number]  PCP:  Valerie Roys, DO   No chief complaint on file.   HPI:  Bonnie Ochoa is a 56 yo woman with PMH Medical and appt noncompliance, Substance abuse-chronic /marijuana bipolar,  anxiety diabetes, hypertension,  asthma,  Obstructive sleep apnea, restless leg syndrome Nonischemic cardiomyopathy,  history of ICD,  ejection fraction 25%, 05/2018 Normal coronary artery, (patient denies any coronary disease on prior cardiac catheterization) anemia Previous alcohol problem  ascites, cirrhosis, History of paracentesis Previously on pain medication at home, oxycodone She presents today for follow-up of her chronic systolic CHF  Visit with Christell Faith in 5/21 and Entresto decreased to 24/26 bid due to low BP Echo 7/21 EF 20-25% RV ok.  Followed in the advanced heart failure clinic, seen 2 months ago - Stable NYHA III-IIIb  LOV 9/21 Weight down 35 pounds from 2019  Labs reviewed HGBA1C 12.7 to 6.8  Total chol 303 to 152 LDL 84  ECHO 07/2020 Ef 25 to 30-%  No significant leg swelling, no abdominal bloating, does not feel she has extra fluid  Active, walking ,>10 minutes endurance  meds as listed below spironolactone to 25 mg daily   carvedilol 12.5 mg twice a day.  digoxin 0.125 mg daily.  entresto 49/51 bid mg twice a day.  EKG personally reviewed by myself on todays visit Shows normal sinus rhythm rate 95 bpm nonspecific T wave abnormality V6, 1 aVL Unchanged  Other past medical history reviewed hospitalization October 25, 2018 for renal failure CR 2.15 Felt to be secondary to dehydration and improved with IV fluids She was continued on Lasix at discharge   Other past medical history reviewed Previous hospitalization November 2018 for hyperglycemia nonketotic coma She had acute renal failure and hyponatremia  Hospital admission 09/10/16 numerous sx on arrival to  ER chest pain, shortness of breath, abdominal pain, dysuria, urinary frequency, syncope 2 today with lightheadedness, nausea and vomiting, and diarrhea , sob TBili 2.4, had paracentesis, diuresis D/c on lasix 60 BID   Hospital admission 09/17/16: chest pain, ABD pain fevers, chills, chest pain, shortness of breath, vomiting and diarrhea. "ran out of meds" HTN, acute on chronic systolic CHF   Seen in the emergency room 05/19/2016 for abdominal pain, chest pain BNP in the hospital 3700  acute on chronic CHF, Had 20 L diuresis   CT scan consistent with moderate abdominal ascites, fatty liver Aortic atherosclerosis   PMH:   has a past medical history of Abdominal pain, ADHD, AICD (automatic cardioverter/defibrillator) present, Anemia, Arthritis, Asthma, Bipolar 1 disorder ([place]), Chest pain, CHF (congestive heart failure) ([place]), Cirrhosis of liver ([place]), Coronary artery disease, Depression, Diabetes mellitus without complication ([place]), Diverticulitis, Dysrhythmia, Heart murmur, HFrEF (heart failure with reduced ejection fraction) ([place]), Hypertension, Hypothyroidism, IBS (irritable bowel syndrome), Insomnia, Migraines, NICM (nonischemic cardiomyopathy) ([place]), OSA (obstructive sleep apnea), PTSD (post-traumatic stress disorder), Restless leg syndrome, and Vertigo.  PSH:    Past Surgical History:  Procedure Laterality Date   ABDOMINAL HYSTERECTOMY     CARDIAC CATHETERIZATION     CATARACT EXTRACTION W/PHACO Right 01/27/2019   Procedure: CATARACT EXTRACTION PHACO AND INTRAOCULAR LENS PLACEMENT ([place])  VISION BLUE RIGHT DIABETES;  Surgeon: Birder Robson, MD;  Location: [facility];  Service: Ophthalmology;  Laterality: Right;  Diabetes - insulin sleep apnea   CATARACT EXTRACTION W/PHACO Left 03/08/2020   Procedure: CATARACT EXTRACTION PHACO AND INTRAOCULAR LENS PLACEMENT (I[facility]) LEFT DIABETIC  10.28  00:57.8;  Surgeon: Birder Robson, MD;  Location: [facility];  Service:  Ophthalmology;  Laterality: Left;   COLONOSCOPY WITH PROPOFOL N/A 03/11/2019   Procedure: COLONOSCOPY WITH PROPOFOL;  Surgeon: Virgel Manifold, MD;  Location: [facility];  Service: Endoscopy;  Laterality: N/A;   CORONARY ARTERY BYPASS GRAFT     Pt denies   ESOPHAGOGASTRODUODENOSCOPY (EGD) WITH PROPOFOL N/A 03/11/2019   Procedure: ESOPHAGOGASTRODUODENOSCOPY (EGD) WITH PROPOFOL;  Surgeon: Virgel Manifold, MD;  Location: [facility];  Service: Endoscopy;  Laterality: N/A;   INSERT / REPLACE / REMOVE PACEMAKER     ICD   INSERTION OF ICD  2016   St Jude Single chamber ICD   RIGHT/LEFT HEART CATH AND CORONARY ANGIOGRAPHY N/A 06/09/2018   Procedure: RIGHT/LEFT HEART CATH AND CORONARY ANGIOGRAPHY;  Surgeon: Wellington Hampshire, MD;  Location: [facility];  Service: Cardiovascular;  Laterality: N/A;   TONSILLECTOMY     TUBAL LIGATION  1980    Current Outpatient Medications  Medication Sig Dispense Refill   blood glucose meter kit and supplies KIT Dispense based on patient and insurance preference. Use up to four times daily as directed. (FOR ICD-9 250.00, 250.01). 1 each 0   carvedilol (COREG) 12.5 MG tablet Take 1 tablet (12.5 mg total) by mouth 2 (two) times daily with a meal. 180 tablet 3   cyclobenzaprine (FLEXERIL) 5 MG tablet TAKE 1 TABLET BY MOUTH THREE TIMES A DAY AS NEEDED FOR MUSCLE SPASMS 60 tablet 1   digoxin (LANOXIN) 0.125 MG tablet TAKE 1 TABLET BY MOUTH DAILY 90 tablet 3   famotidine (PEPCID) 40 MG tablet TAKE 1 TABLET BY MOUTH EVERY DAY IN THE EVENING 90 tablet 0   furosemide (LASIX) 20 MG tablet Take 1 tablet (20 mg total) by mouth as directed. Take 68m (1 tablet) Monday, Wednesday, Friday. 40 tablet 3   insulin glargine (LANTUS SOLOSTAR) 100 UNIT/ML Solostar Pen Inject 45 Units into the skin daily at 10 pm. 15 mL 1   Insulin Pen Needle 32G X 6 MM MISC 1 each by Does not apply route daily. 100 each 12   JARDIANCE 25 MG TABS tablet TAKE 1 TABLET BY MOUTH  EVERY DAY 90 tablet 1   lamoTRIgine (LAMICTAL) 150 MG tablet TAKE 1 TABLET BY MOUTH EVERY DAY 30 tablet 0   metoCLOPramide (REGLAN) 10 MG tablet Take 10 mg by mouth 4 (four) times daily.     pantoprazole (PROTONIX) 40 MG tablet TAKE 1 TABLET BY MOUTH EVERY DAY 90 tablet 1   PARoxetine (PAXIL) 30 MG tablet Take 1 tablet (30 mg total) by mouth daily. 90 tablet 1   potassium chloride SA (KLOR-CON M20) 20 MEQ tablet TAKE 2 TABLETS BY MOUTH 2 TIMES DAILY. 120 tablet 0   risperiDONE (RISPERDAL) 0.25 MG tablet TAKE 1 TABLET (0.25 MG TOTAL) BY MOUTH EVERY MORNING. 90 tablet 1   rosuvastatin (CRESTOR) 40 MG tablet Take 1 tablet (40 mg total) by mouth daily. 90 tablet 3   sacubitril-valsartan (ENTRESTO) 97-103 MG Take 1 tablet by mouth 2 (two) times daily. 60 tablet 11   spironolactone (ALDACTONE) 25 MG tablet Take 1 tablet (25 mg total) by mouth daily. 90 tablet 3   sucralfate (CARAFATE) 1 g tablet TAKE 1 TABLET (1 G TOTAL) BY MOUTH 4 (FOUR) TIMES DAILY. 360 tablet 3   zolpidem (AMBIEN) 10 MG tablet TAKE 1 TABLET BY MOUTH AT BEDTIME AS NEEDED FOR SLEEP. 21 tablet 0   No current facility-administered medications  for this visit.     Allergies:   Levothyroxine   Social History:  The patient  reports that she quit smoking about 37 years ago. Her smoking use included cigarettes. She has never used smokeless tobacco. She reports that she does not currently use alcohol. She reports current drug use. Frequency: 1.00 time per week. Drug: Marijuana.   Family History:   family history includes Alzheimer's disease in her maternal grandfather; Asthma in her sister and sister; Breast cancer in her paternal aunt; Cancer in her sister; Diabetes in her sister and sister; Heart disease in her mother and sister; Hyperlipidemia in her brother and mother; Hypertension in her mother, sister, and sister.    Review of Systems: Review of Systems  Constitutional: Negative.   HENT: Negative.    Respiratory: Negative.     Cardiovascular: Negative.   Gastrointestinal: Negative.   Musculoskeletal: Negative.   Neurological: Negative.   Psychiatric/Behavioral: Negative.    All other systems reviewed and are negative.  PHYSICAL EXAM: VS:  There were no vitals taken for this visit. , BMI There is no height or weight on file to calculate BMI.  Constitutional:  oriented to person, place, and time. No distress.  HENT:  Head: Grossly normal Eyes:  no discharge. No scleral icterus.  Neck: No JVD, no carotid bruits  Cardiovascular: Regular rate and rhythm, no murmurs appreciated Pulmonary/Chest: Clear to auscultation bilaterally, no wheezes or rails Abdominal: Soft.  no distension.  no tenderness.  Musculoskeletal: Normal range of motion Neurological:  normal muscle tone. Coordination normal. No atrophy Skin: Skin warm and dry Psychiatric: normal affect, pleasant   Recent Labs: 01/10/2021: ALT 12; Hemoglobin 13.9; Platelets 208; TSH 1.860 01/18/2021: B Natriuretic Peptide 39.1 01/31/2021: BUN 18; Creatinine, Ser 1.37; Potassium 4.0; Sodium 140    Lipid Panel Lab Results  Component Value Date   CHOL 188 01/10/2021   HDL 35 (L) 01/10/2021   LDLCALC 93 01/10/2021   TRIG 359 (H) 01/10/2021      Wt Readings from Last 3 Encounters:  05/18/21 191 lb 9.6 oz (86.9 kg)  04/13/21 194 lb (88 kg)  02/15/21 189 lb 8 oz (86 kg)     ASSESSMENT AND PLAN:  Problem List Items Addressed This Visit   None Nonischemic cardiomyopathy EF with improvement 25 to 30% Medication compliant here Continue current meds, BP low, will not increase the doses Dig level borderline elevated, stable past 2 checks, will continue for now  Diabetes type 2 with complications Hemoglobin A1c dramatically improved, now <7  Bipolar 1 disorder/PTSD Followed by PMD Doing well   Cirrhosis Followed by GI,  As on previous visits we have recommended alcohol cessation    Total encounter time more than 25 minutes  Greater than 50%  was spent in counseling and coordination of care with the patient    Signed, Esmond Plants, M.D., Ph.D. [place], [place]

## 2021-07-28 ENCOUNTER — Ambulatory Visit: Payer: Medicare Other | Admitting: Cardiovascular Disease

## 2021-07-28 DIAGNOSIS — I428 Other cardiomyopathies: Secondary | ICD-10-CM

## 2021-07-28 DIAGNOSIS — Z95810 Presence of automatic (implantable) cardiac defibrillator: Secondary | ICD-10-CM

## 2021-07-28 DIAGNOSIS — N182 Chronic kidney disease, stage 2 (mild): Secondary | ICD-10-CM

## 2021-07-28 DIAGNOSIS — I5022 Chronic systolic (congestive) heart failure: Secondary | ICD-10-CM

## 2021-07-28 DIAGNOSIS — E1165 Type 2 diabetes mellitus with hyperglycemia: Secondary | ICD-10-CM

## 2021-07-28 DIAGNOSIS — I10 Essential (primary) hypertension: Secondary | ICD-10-CM

## 2021-07-28 DIAGNOSIS — G4733 Obstructive sleep apnea (adult) (pediatric): Secondary | ICD-10-CM

## 2021-08-10 ENCOUNTER — Ambulatory Visit (INDEPENDENT_AMBULATORY_CARE_PROVIDER_SITE_OTHER): Payer: Medicare Other | Admitting: Licensed Clinical Social Worker

## 2021-08-10 DIAGNOSIS — F4312 Post-traumatic stress disorder, chronic: Secondary | ICD-10-CM

## 2021-08-10 DIAGNOSIS — F5105 Insomnia due to other mental disorder: Secondary | ICD-10-CM

## 2021-08-10 DIAGNOSIS — K7469 Other cirrhosis of liver: Secondary | ICD-10-CM

## 2021-08-10 DIAGNOSIS — I129 Hypertensive chronic kidney disease with stage 1 through stage 4 chronic kidney disease, or unspecified chronic kidney disease: Secondary | ICD-10-CM

## 2021-08-10 DIAGNOSIS — F3132 Bipolar disorder, current episode depressed, moderate: Secondary | ICD-10-CM

## 2021-08-10 DIAGNOSIS — Z794 Long term (current) use of insulin: Secondary | ICD-10-CM

## 2021-08-10 DIAGNOSIS — E1122 Type 2 diabetes mellitus with diabetic chronic kidney disease: Secondary | ICD-10-CM

## 2021-08-11 NOTE — Patient Instructions (Signed)
Visit Information  Thank you for taking time to visit with me today. Please don't hesitate to contact me if I can be of assistance to you before our next scheduled telephone appointment.  Following are the goals we discussed today:  Patient Goals/Self-Care Activities: Over the next 120 days Call [facility] Transportation [phone]  Continue with compliance of taking medication  Collaborate with the community resource care guide Increase coping skills, healthy habits, and self-management skills Follow up with Civil engineer, contracting for grief support  Our next appointment is by telephone on 11/02/21   Please call the care guide team at [phone] if you need to cancel or reschedule your appointment.   If you are experiencing a Mental Health or Behavioral Health Crisis or need someone to talk to, please call the Suicide and Crisis Lifeline: 988 call 911   Patient verbalizes understanding of instructions provided today and agrees to view in MyChart.   Jenel Lucks, MSW, LCSW [facility] Care Management    [facility] [email or web address] Phone [phone] 7:05 PM

## 2021-08-11 NOTE — Chronic Care Management (AMB) (Signed)
Chronic Care Management    Clinical Social Work Note  08/11/2021 Name: Bonnie Ochoa MRN: [id number] DOB: 1965-11-13  Bonnie Ochoa is a 55 y.o. year old female who is a primary care patient of Valerie Roys, DO. The CCM team was consulted to assist the patient with chronic disease management and/or care coordination needs related to: Mental Health Counseling and Resources.   Engaged with patient by telephone for follow up visit in response to provider referral for social work chronic care management and care coordination services.   Consent to Services:  The patient was given information about Chronic Care Management services, agreed to services, and gave verbal consent prior to initiation of services.  Please see initial visit note for detailed documentation.   Patient agreed to services and consent obtained.   Consent to Services:  The patient was given information about Care Management services, agreed to services, and gave verbal consent prior to initiation of services.  Please see initial visit note for detailed documentation.   Patient agreed to services today and consent obtained.  Engaged with patient by phone in response to provider referral for social work care coordination services:  Assessment/Interventions:  Patient continues to maintain positive progress with care plan goals. She received supportive resources from care guide and reports management of symptoms with continued med management. See Care Plan below for interventions and patient self-care activities.  Recommendation: Patient may benefit from, and is in agreement work with LCSW to address care coordination needs and will continue to work with the clinical team to address health care and disease management related needs.  Follow up Plan: Patient would like continued follow-up from CCM LCSW.  per patient's request will follow up in 11/02/21.  Will call office if needed prior to next encounter.  SDOH (Social  Determinants of Health) assessments and interventions performed:    Advanced Directives Status: Not addressed in this encounter.  CCM Care Plan  Allergies  Allergen Reactions   Levothyroxine Rash    Outpatient Encounter Medications as of 08/10/2021  Medication Sig   blood glucose meter kit and supplies KIT Dispense based on patient and insurance preference. Use up to four times daily as directed. (FOR ICD-9 250.00, 250.01).   carvedilol (COREG) 12.5 MG tablet Take 1 tablet (12.5 mg total) by mouth 2 (two) times daily with a meal.   cyclobenzaprine (FLEXERIL) 5 MG tablet TAKE 1 TABLET BY MOUTH THREE TIMES A DAY AS NEEDED FOR MUSCLE SPASMS   digoxin (LANOXIN) 0.125 MG tablet TAKE 1 TABLET BY MOUTH DAILY   famotidine (PEPCID) 40 MG tablet TAKE 1 TABLET BY MOUTH EVERY DAY IN THE EVENING   furosemide (LASIX) 20 MG tablet Take 1 tablet (20 mg total) by mouth as directed. Take 88m (1 tablet) Monday, Wednesday, Friday.   insulin glargine (LANTUS SOLOSTAR) 100 UNIT/ML Solostar Pen Inject 45 Units into the skin daily at 10 pm.   Insulin Pen Needle 32G X 6 MM MISC 1 each by Does not apply route daily.   JARDIANCE 25 MG TABS tablet TAKE 1 TABLET BY MOUTH EVERY DAY   lamoTRIgine (LAMICTAL) 150 MG tablet TAKE 1 TABLET BY MOUTH EVERY DAY   metoCLOPramide (REGLAN) 10 MG tablet Take 10 mg by mouth 4 (four) times daily.   pantoprazole (PROTONIX) 40 MG tablet TAKE 1 TABLET BY MOUTH EVERY DAY   PARoxetine (PAXIL) 30 MG tablet Take 1 tablet (30 mg total) by mouth daily.   potassium chloride SA (KLOR-CON M20) 20 MEQ tablet TAKE  2 TABLETS BY MOUTH 2 TIMES DAILY.   risperiDONE (RISPERDAL) 0.25 MG tablet TAKE 1 TABLET (0.25 MG TOTAL) BY MOUTH EVERY MORNING.   rosuvastatin (CRESTOR) 40 MG tablet Take 1 tablet (40 mg total) by mouth daily.   sacubitril-valsartan (ENTRESTO) 97-103 MG Take 1 tablet by mouth 2 (two) times daily.   spironolactone (ALDACTONE) 25 MG tablet Take 1 tablet (25 mg total) by mouth daily.    sucralfate (CARAFATE) 1 g tablet TAKE 1 TABLET (1 G TOTAL) BY MOUTH 4 (FOUR) TIMES DAILY.   zolpidem (AMBIEN) 10 MG tablet TAKE 1 TABLET BY MOUTH AT BEDTIME AS NEEDED FOR SLEEP.   No facility-administered encounter medications on file as of 08/10/2021.    Patient Active Problem List   Diagnosis Date Noted   Insomnia due to mental condition 02/16/2019   Bipolar disorder, current episode depressed, moderate ([place]) 02/16/2019   Chronic post-traumatic stress disorder (PTSD) 02/16/2019   Panic disorder 02/16/2019   Cannabis use disorder, mild, abuse 02/16/2019   Unstable angina ([place]) 06/07/2018   Hypothyroid 04/30/2017   Multiple thyroid nodules 24/58/0998   Chronic systolic heart failure ([place]) 10/08/2016   Obstructive sleep apnea 10/08/2016   Cirrhosis ([place]) 09/18/2016   Pulmonary hypertension ([place]) 05/30/2016   Hypokalemia 05/30/2016   NICM (nonischemic cardiomyopathy) ([place]) 05/30/2016   Benign hypertensive renal disease    DM (diabetes mellitus), type 2 with renal complications ([place])    Asthma     Conditions to be addressed/monitored: Anxiety, Depression, and PTSD  Care Plan : LCSW Plan of Care  Updates made by Rebekah [place], LCSW since 08/11/2021 12:00 AM     Problem: Symptoms of Depression and Anxiety      Long-Range Goal: Coping Skills Enhanced   Start Date: 06/01/2021  This Visit's Progress: On track  Recent Progress: On track  Priority: High  Note:   Current barriers:   Severe Persistent Mental Health needs related to Bipolar Disorder, PTSD, Panic Disorder Transportation, Limited access to food, Mental Health Concerns , and Substance abuse issues -  [place] Needs Support, Education, and Care Coordination in order to meet unmet mental health needs. Clinical Goal(s): demonstrate a reduction in symptoms related to :Anxiety, Depression, and Bipolar Disorder  verbalize understanding of plan for management of DMII  explore community resource options for unmet needs  related to:  Transportation, Food Insecurity , and Alcohol/Substance Use  Clinical Interventions:  Assessed patient's previous and current treatment, coping skills, support system and barriers to care  Patient picked up medication and started 45 units. Patient shared things are going well with no side effects. Patient checks BS 2-3 x weekly 11/7: Patient continues to comply with medication management noting she may check BS once a week Patient participates in medication management through [place]. She has follow up appts every 3 months 12/29: Patient continues to participate in services Patient endorses difficulty with stable transportation. CCM LCSW discussed [facility] and patient provided verbal consent for to complete a referral 11/7: Pt was informed that Care Guide has made an attempt to contact her. LCSW provided pt with Care Guide's callback number to initiate resources 12/29: Resources were provided Patient reports food insecurity, which negatively impacts her ability to manage medical conditions (DMII) She shared that her SNAP benefits for discontinued after patient's social security increased slightly. CCM LCSW completed referral to care guide for assistance CCM LCSW discussed strategies to assist with stress management and self-care. Patient was successful at identifying healthy coping skills   Patient  reports that depression and anxiety are triggered by grief of mother and often symptoms go up and down They include fluctuating appetite, difficulty sleeping, and overwhelming feelings of sadness. She denies SI/HI CCM LCSW provided validation and encouragement. CCM LCSW strongly encouraged patient to identify warning signs to when symptoms are unmanaged, in addition, to strategies to positively impact mood. Patient receives strong support from wife.  CCM LCSW discussed grief support resources. Patient provided consent for CCM LCSW to send information on [facility]   Patient reports difficulty managing joint pain in fingers, elbows, knees, and ankles. She obtains relief from OTC medicines (Advil and Aleve) 11/7: Patient endorses increase in back pain. Pt takes OTC medications to assist in alleviating pain 12/29: Patient reports continued management of back pain with OTC meds Depression screen reviewed  Mindfulness or Relaxation training provided Active listening / Reflection utilized  Emotional Support Provided Provided psychoeducation for mental health needs  Provided brief CBT  Quality of sleep assessed & Sleep Hygiene techniques promoted  Participation in support group encouraged  Verbalization of feelings encouraged  Crisis Resource Education / information provided  Suicidal Ideation/Homicidal Ideation assessed: Patient denies SI/HI Made referral to Care Guide to assist with Food Insecurity  Review various resources, discussed options and provided patient information about  [facility]  Referral to care guide (Food Insecurity)  1:1 collaboration with primary care provider regarding development and update of comprehensive plan of care as evidenced by provider attestation and co-signature Inter-disciplinary care team collaboration (see longitudinal plan of care) Patient Goals/Self-Care Activities: Over the next 120 days Call [employer] [phone]  Continue with compliance of taking medication  Collaborate with the community resource care guide Increase coping skills, healthy habits, and self-management skills Follow up with Manufacturing engineer for grief support        Christa See, [email or web address], [email or web address] Phone [phone] 7:03 PM

## 2021-08-12 ENCOUNTER — Other Ambulatory Visit: Payer: Self-pay | Admitting: Family Medicine

## 2021-08-12 DIAGNOSIS — E1169 Type 2 diabetes mellitus with other specified complication: Secondary | ICD-10-CM | POA: Diagnosis not present

## 2021-08-12 DIAGNOSIS — Z794 Long term (current) use of insulin: Secondary | ICD-10-CM | POA: Diagnosis not present

## 2021-08-12 DIAGNOSIS — F32A Depression, unspecified: Secondary | ICD-10-CM

## 2021-08-12 NOTE — Telephone Encounter (Signed)
Requested Prescriptions  Pending Prescriptions Disp Refills   JARDIANCE 25 MG TABS tablet [Pharmacy Med Name: JARDIANCE 25 MG TABLET] 90 tablet 0    Sig: TAKE 1 TABLET BY MOUTH EVERY DAY     Endocrinology:  Diabetes - SGLT2 Inhibitors Failed - 08/12/2021  2:24 AM      Failed - Cr in normal range and within 360 days    Creatinine, Ser  Date Value Ref Range Status  01/31/2021 1.37 (H) 0.57 - 1.00 mg/dL Final   Creatinine, Urine  Date Value Ref Range Status  07/02/2017 128 mg/dL Final         Failed - HBA1C is between 0 and 7.9 and within 180 days    Hemoglobin A1C  Date Value Ref Range Status  02/19/2018 7.8  Final   HB A1C (BAYER DCA - WAIVED)  Date Value Ref Range Status  04/13/2021 9.5 (H) <7.0 % Final    Comment:                                          Diabetic Adult            <7.0                                       Healthy Adult        4.3 - 5.7                                                           (DCCT/NGSP) American Diabetes Association's Summary of Glycemic Recommendations for Adults with Diabetes: Hemoglobin A1c <7.0%. More stringent glycemic goals (A1c <6.0%) may further reduce complications at the cost of increased risk of hypoglycemia.           **Effective April 17, 2021 Bayer DCA Hb A1c Waived**             reference interval will be changing to:                                                            4.8 - 5.6             Prediabetes: 5.7 - 6.4             Diabetes: >6.4             Glycemic control for adults with diabetes: <7.0          Failed - AA eGFR in normal range and within 360 days    GFR calc Af Amer  Date Value Ref Range Status  07/11/2020 60 >59 mL/min/1.73 Final    Comment:    **In accordance with recommendations from the NKF-ASN Task force,**   [facility] is in the process of updating its eGFR calculation to the   2021 CKD-EPI creatinine equation that estimates kidney function   without a race variable.    GFR, Estimated   Date Value  Ref Range Status  01/18/2021 47 (L) >60 mL/min Final    Comment:    (NOTE) Calculated using the CKD-EPI Creatinine Equation (2021)    eGFR  Date Value Ref Range Status  01/31/2021 45 (L) >59 mL/min/1.73 Final         Passed - LDL in normal range and within 360 days    LDL Chol Calc (NIH)  Date Value Ref Range Status  01/10/2021 93 0 - 99 mg/dL Final         Passed - Valid encounter within last 6 months    Recent Outpatient Visits          2 months ago Type 2 diabetes mellitus with stage 3a chronic kidney disease, with long-term current use of insulin ([place])   [facility] [place], Megan P, DO   4 months ago Type 2 diabetes mellitus with stage 3a chronic kidney disease, with long-term current use of insulin ([place])   [facility] [place], Megan P, DO   7 months ago Chest pain, unspecified type   [facility] [place], Megan P, DO   1 year ago Type 2 diabetes mellitus with stage 3a chronic kidney disease, with long-term current use of insulin ([place])   [facility] [place], Megan P, DO   1 year ago Type 2 diabetes mellitus with stage 3a chronic kidney disease, with long-term current use of insulin ([place])   [facility] [place], [place], DO      Future Appointments            In 3 weeks Gollan, Kathlene November, MD [place], [facility]   In 1 month  [employer], [facility]

## 2021-08-18 ENCOUNTER — Other Ambulatory Visit: Payer: Self-pay | Admitting: Cardiovascular Disease

## 2021-08-31 ENCOUNTER — Other Ambulatory Visit: Payer: Self-pay | Admitting: Family Medicine

## 2021-08-31 NOTE — Telephone Encounter (Signed)
Requested medication (s) are due for refill today: yes  Requested medication (s) are on the active medication list: yes  Last refill:  05/18/21 for 59ml with one refill  Future visit scheduled: no  Notes to clinic:  was asked to return in two months for a check on this med's effectiveness, no upcoming visit scheduled.   Requested Prescriptions  Pending Prescriptions Disp Refills   LANTUS SOLOSTAR 100 UNIT/ML Solostar Pen [Pharmacy Med Name: LANTUS SOLOSTAR 100 UNIT/ML]  1    Sig: Inject 45 Units into the skin daily at 10 pm.     Endocrinology:  Diabetes - Insulins Failed - 08/31/2021  7:16 PM      Failed - HBA1C is between 0 and 7.9 and within 180 days    Hemoglobin A1C  Date Value Ref Range Status  02/19/2018 7.8  Final   HB A1C (BAYER DCA - WAIVED)  Date Value Ref Range Status  04/13/2021 9.5 (H) <7.0 % Final    Comment:                                          Diabetic Adult            <7.0                                       Healthy Adult        4.3 - 5.7                                                           (DCCT/NGSP) American Diabetes Association's Summary of Glycemic Recommendations for Adults with Diabetes: Hemoglobin A1c <7.0%. More stringent glycemic goals (A1c <6.0%) may further reduce complications at the cost of increased risk of hypoglycemia.           **Effective April 17, 2021 Bayer DCA Hb A1c Waived**             reference interval will be changing to:                                                            4.8 - 5.6             Prediabetes: 5.7 - 6.4             Diabetes: >6.4             Glycemic control for adults with diabetes: <7.0           Passed - Valid encounter within last 6 months    Recent Outpatient Visits           3 months ago Type 2 diabetes mellitus with stage 3a chronic kidney disease, with long-term current use of insulin ([place])   [facility] [place], Megan P, DO   4 months ago Type 2 diabetes mellitus with  stage 3a chronic kidney disease, with long-term current use of insulin (  [place])   [place], Megan P, DO   7 months ago Chest pain, unspecified type   [place], Megan P, DO   1 year ago Type 2 diabetes mellitus with stage 3a chronic kidney disease, with long-term current use of insulin ([place])   [place], Megan P, DO   1 year ago Type 2 diabetes mellitus with stage 3a chronic kidney disease, with long-term current use of insulin ([place])   [place], [place], DO       Future Appointments             In 4 days Gollan, Kathlene November, MD [facility], [facility]   In 1 week  [employer], [place]

## 2021-09-01 ENCOUNTER — Other Ambulatory Visit: Payer: Self-pay | Admitting: Cardiovascular Disease

## 2021-09-01 ENCOUNTER — Other Ambulatory Visit: Payer: Self-pay

## 2021-09-01 MED ORDER — LANTUS SOLOSTAR 100 UNIT/ML ~~LOC~~ SOPN: 45.0000 [IU] | PEN_INJECTOR | Freq: Every day | SUBCUTANEOUS | 0 refills | Status: DC

## 2021-09-01 NOTE — Telephone Encounter (Signed)
Missing/illegible information on rx. Quantity: insufficient quantity to dispense. 15 mL= minimum quantity

## 2021-09-01 NOTE — Telephone Encounter (Signed)
Pt is scheduled for next available appt which is 09/14/2021

## 2021-09-01 NOTE — Telephone Encounter (Signed)
Patient is overdue for appointment. Please call to schedule.  

## 2021-09-04 ENCOUNTER — Ambulatory Visit: Payer: Medicare Other | Admitting: Cardiovascular Disease

## 2021-09-05 ENCOUNTER — Encounter: Payer: Self-pay | Admitting: Cardiovascular Disease

## 2021-09-11 ENCOUNTER — Ambulatory Visit (INDEPENDENT_AMBULATORY_CARE_PROVIDER_SITE_OTHER): Payer: Medicare Other | Admitting: *Deleted

## 2021-09-11 DIAGNOSIS — Z0000 Encounter for general adult medical examination without abnormal findings: Secondary | ICD-10-CM | POA: Diagnosis not present

## 2021-09-11 NOTE — Progress Notes (Signed)
Subjective:   Bonnie Ochoa is a 56 y.o. female who presents for Medicare Annual (Subsequent) preventive examination  I connected with  [name] on 09/11/21 by a telephone enabled telemedicine application and verified that I am speaking with the correct person using two identifiers.   I discussed the limitations of evaluation and management by telemedicine. The patient expressed understanding and agreed to proceed.  Patient location: home  Provider location: Tele-Health not in office    Review of Systems     Cardiac Risk Factors include: advanced age (>96mn, >>56women);diabetes mellitus;hypertension;obesity (BMI >30kg/m2);sedentary lifestyle     Objective:    Today's Vitals   09/11/21 0953  PainSc: 9    There is no height or weight on file to calculate BMI.  Advanced Directives 09/11/2021 10/26/2020 09/09/2020 03/08/2020 03/11/2019 01/27/2019 10/25/2018  Does Patient Have a Medical Advance Directive? No No No No No No No  Type of Advance Directive - - - - - - -  Does patient want to make changes to medical advance directive? - - - - - - -  Copy of [place] Chart? - - - - - - -  Would patient like information on creating a medical advance directive? No - Patient declined Yes (MAU/Ambulatory/Procedural Areas - Information given) - Yes (MAU/Ambulatory/Procedural Areas - Information given) No - Patient declined No - Patient declined Yes (Inpatient - patient requests chaplain consult to create a medical advance directive)  Some encounter information is confidential and restricted. Go to Review Flowsheets activity to see all data.    Current Medications (verified) Outpatient Encounter Medications as of 09/11/2021  Medication Sig   blood glucose meter kit and supplies KIT Dispense based on patient and insurance preference. Use up to four times daily as directed. (FOR ICD-9 250.00, 250.01).   carvedilol (COREG) 12.5 MG tablet Take 1 tablet (12.5 mg total) by  mouth 2 (two) times daily with a meal.   digoxin (LANOXIN) 0.125 MG tablet TAKE 1 TABLET BY MOUTH DAILY   famotidine (PEPCID) 40 MG tablet TAKE 1 TABLET BY MOUTH EVERY DAY IN THE EVENING   furosemide (LASIX) 20 MG tablet Take 1 tablet (20 mg total) by mouth as directed. Take 248m(1 tablet) Monday, Wednesday, Friday.   insulin glargine (LANTUS SOLOSTAR) 100 UNIT/ML Solostar Pen Inject 45 Units into the skin daily at 10 pm.   Insulin Pen Needle 32G X 6 MM MISC 1 each by Does not apply route daily.   JARDIANCE 25 MG TABS tablet TAKE 1 TABLET BY MOUTH EVERY DAY   lamoTRIgine (LAMICTAL) 150 MG tablet TAKE 1 TABLET BY MOUTH EVERY DAY   metoCLOPramide (REGLAN) 10 MG tablet Take 10 mg by mouth 4 (four) times daily.   pantoprazole (PROTONIX) 40 MG tablet TAKE 1 TABLET BY MOUTH EVERY DAY   PARoxetine (PAXIL) 30 MG tablet Take 1 tablet (30 mg total) by mouth daily.   potassium chloride SA (KLOR-CON M20) 20 MEQ tablet TAKE 2 TABLETS BY MOUTH TWICE A DAY   risperiDONE (RISPERDAL) 0.25 MG tablet TAKE 1 TABLET (0.25 MG TOTAL) BY MOUTH EVERY MORNING.   rosuvastatin (CRESTOR) 40 MG tablet Take 1 tablet (40 mg total) by mouth daily.   sacubitril-valsartan (ENTRESTO) 97-103 MG Take 1 tablet by mouth 2 (two) times daily.   spironolactone (ALDACTONE) 25 MG tablet Take 1 tablet (25 mg total) by mouth daily.   sucralfate (CARAFATE) 1 g tablet TAKE 1 TABLET (1 G TOTAL) BY MOUTH 4 (FOUR)  TIMES DAILY.   zolpidem (AMBIEN) 10 MG tablet TAKE 1 TABLET BY MOUTH AT BEDTIME AS NEEDED FOR SLEEP.   cyclobenzaprine (FLEXERIL) 5 MG tablet TAKE 1 TABLET BY MOUTH THREE TIMES A DAY AS NEEDED FOR MUSCLE SPASMS (Patient not taking: Reported on 09/11/2021)   No facility-administered encounter medications on file as of 09/11/2021.    Allergies (verified) Levothyroxine   History: Past Medical History:  Diagnosis Date   Abdominal pain    a. 05/2018 HIDA scan wnl.   ADHD    AICD (automatic cardioverter/defibrillator) present    a.  11/2014 s/p SJM Fortify Assura, single lead AICD (ser# [id number]).   Anemia    Arthritis    Asthma    Bipolar 1 disorder ([place])    Chest pain    a. Hx of cath in [place] - reportedly nl; b. 04/2018 MV: EF 22%, fixed dist ant septal, apical, and inferoapical defects - ? scar vs. attenuation. No ischemia.   CHF (congestive heart failure) (HCC)    Cirrhosis of liver ([place])    Coronary artery disease    Depression    Diabetes mellitus without complication ([place])    Diverticulitis    Dysrhythmia    Heart murmur    HFrEF (heart failure with reduced ejection fraction) ([place])    a. 06/2017 Echo: EF 20-25%, diff HK. Mildly dil LA; b. 05/2018 Echo: EF 25-30%, diff HK, Gr2 DD. Triv AI. Mod MR. Mildly reduced RV fxn. Mod-Sev TR. PASP 45-51mHg.   Hypertension    Hypothyroidism    IBS (irritable bowel syndrome)    Insomnia    Migraines    NICM (nonischemic cardiomyopathy) ([place]    a. EF prev 25%; b. 11/2014 s/p SJM Fortify Assura, single lead AICD (ser# [id number]; c. 06/2017 Echo: EF 20-25%; d. 05/2018 Echo: EF 25-30%, diff HK, Gr2 DD; e. 05/2018 Echo: EF 25-30%; f. 05/2018 Cath: Nl cors. LVEDP 269mg, PCWP 3284m. PA 65/40 (52). CO/CI 3.04/1.56; c. 07/2018 CPX: Mod HF limitation.   OSA (obstructive sleep apnea)    CPAP is broken   PTSD (post-traumatic stress disorder)    Restless leg syndrome    Vertigo    Past Surgical History:  Procedure Laterality Date   ABDOMINAL HYSTERECTOMY     CARDIAC CATHETERIZATION     CATARACT EXTRACTION W/PHACO Right 01/27/2019   Procedure: CATARACT EXTRACTION PHACO AND INTRAOCULAR LENS PLACEMENT ([place] BLUE RIGHT DIABETES;  Surgeon: PorBirder RobsonD;  [facility];  Laterality: Right;  Diabetes - insulin sleep apnea   CATARACT EXTRACTION W/PHACO Left 03/08/2020   Procedure: CATARACT EXTRACTION PHACO AND INTRAOCULAR LENS PLACEMENT (IOC) LEFT DIABETIC 10.28  00:57.8;  Surgeon: PorBirder RobsonD;  [facility];  Laterality: Left;   COLONOSCOPY WITH PROPOFOL N/A 03/11/2019   Procedure: COLONOSCOPY WITH PROPOFOL;  Surgeon: TahVirgel ManifoldD;  Location: [facility];  Service: Endoscopy;  Laterality: N/A;   CORONARY ARTERY BYPASS GRAFT     Pt denies   ESOPHAGOGASTRODUODENOSCOPY (EGD) WITH PROPOFOL N/A 03/11/2019   Procedure: ESOPHAGOGASTRODUODENOSCOPY (EGD) WITH PROPOFOL;  Surgeon: TahVirgel ManifoldD;  Location: [facility] END[facility]COPY;  Service: Endoscopy;  Laterality: N/A;   INSERT / REPLACE / REMOVE PACEMAKER     ICD   INSERTION OF ICD  2016   St Jude Single chamber ICD   RIGHT/LEFT HEART CATH AND CORONARY ANGIOGRAPHY N/A 06/09/2018   Procedure: RIGHT/LEFT HEART CATH AND CORONARY ANGIOGRAPHY;  Surgeon: AriWellington HampshireD;  Location: [place] [facility];  Service: Cardiovascular;  Laterality: N/A;   TONSILLECTOMY     TUBAL LIGATION  1980   Family History  Problem Relation Age of Onset   Hypertension Mother    Hyperlipidemia Mother    Heart disease Mother    Hypertension Sister    Asthma Sister    Heart disease Sister    Diabetes Sister    Cancer Sister    Alzheimer's disease Maternal Grandfather    Hyperlipidemia Brother    Asthma Sister    Hypertension Sister    Diabetes Sister    Breast cancer Paternal Aunt    Social History   Socioeconomic History   Marital status: Married    Spouse name: Not on file   Number of children: 2   Years of education: Not on file   Highest education level: High school graduate  Occupational History    Comment: disabled  Tobacco Use   Smoking status: Former    Types: Cigarettes    Quit date: 1985    Years since quitting: 38.1   Smokeless tobacco: Never   Tobacco comments:    quit over  20 years ago   Vaping Use   Vaping Use: Never used  Substance and Sexual Activity   Alcohol use: Not Currently   Drug use: Yes    Frequency: 1.0 times per week    Types: Marijuana    Comment: occassionally   Sexual  activity: Yes    Birth control/protection: Surgical    Comment: one partner  Other Topics Concern   Not on file  Social History Narrative   Volunteers occasionally    Social Determinants of Health   Financial Resource Strain: Medium Risk   Difficulty of Paying Living Expenses: Somewhat hard  Food Insecurity: No Food Insecurity   Worried About Charity fundraiser in the Last Year: Never true   [place] in the Last Year: Never true  Transportation Needs: No Transportation Needs   Lack of Transportation (Medical): No   Lack of Transportation (Non-Medical): No  Physical Activity: Inactive   Days of Exercise per Week: 0 days   Minutes of Exercise per Session: 0 min  Stress: Stress Concern Present   Feeling of Stress : To some extent  Social Connections: Socially Isolated   Frequency of Communication with Friends and Family: Once a week   Frequency of Social Gatherings with Friends and Family: Once a week   Attends Religious Services: Never   Marine scientist or Organizations: No   Attends Music therapist: Never   Marital Status: Married    Tobacco Counseling Counseling given: Not Answered Tobacco comments: quit over  20 years ago    Clinical Intake:  Pre-visit preparation completed: Yes  Pain : 0-10 Pain Score: 9  Pain Type: Chronic pain Pain Location: Hand Pain Orientation: Right Pain Descriptors / Indicators: Constant, Burning, Aching Pain Onset: More than a month ago Pain Frequency: Constant Pain Relieving Factors: tylenol Effect of Pain on Daily Activities: yes  Pain Relieving Factors: tylenol  Nutritional Risks: None Diabetes: Yes CBG done?: No Did pt. bring in CBG monitor from home?: No  How often do you need to have someone help you when you read instructions, pamphlets, or other written materials from your doctor or pharmacy?: 1 - Never  Diabetic? Yes  Nutrition Risk Assessment:  Has the patient had any N/V/D within the  last 2 months?  No  Does the  patient have any non-healing wounds?  No  Has the patient had any unintentional weight loss or weight gain?  No   Diabetes:  Is the patient diabetic?  Yes  If diabetic, was a CBG obtained today?  No  Did the patient bring in their glucometer from home?  No  How often do you monitor your CBG's? Patient has misplaced her meter. Will discuss a new one at upcoming appointment 09-14-2021 if is unable to find it .   Financial Strains and Diabetes Management:  Are you having any financial strains with the device, your supplies or your medication? No .  Does the patient want to be seen by Chronic Care Management for management of their diabetes?  No  Would the patient like to be referred to a Nutritionist or for Diabetic Management?  No   Diabetic Exams:  Diabetic Eye Exam: Pt has been advised about the importance in completing this exam.   Diabetic Foot Exam:  Pt has been advised about the importance in completing this exam.   Interpreter Needed?: No  Information entered by :: Leroy Kennedy LPN   Activities of Daily Living In your present state of health, do you have any difficulty performing the following activities: 09/11/2021  Hearing? N  Vision? N  Difficulty concentrating or making decisions? Y  Walking or climbing stairs? N  Dressing or bathing? N  Doing errands, shopping? Y  Preparing Food and eating ? N  Using the Toilet? N  In the past six months, have you accidently leaked urine? N  Do you have problems with loss of bowel control? N  Managing your Medications? Y  Managing your Finances? Y  Housekeeping or managing your Housekeeping? Y  Some recent data might be hidden    Patient Care Team: Valerie Roys, DO as PCP - General (Family Medicine) Minna Merritts, MD as PCP - Cardiology (Cardiology) Deboraha Sprang, MD as PCP - Electrophysiology (Cardiology) Bensimhon, Shaune Pascal, MD as PCP - Advanced Heart Failure (Cardiology) [name],  MD as Consulting Physician (Gastroenterology) Minna Merritts, MD as Consulting Physician (Cardiology) Alisa Graff, FNP as Nurse Practitioner (Family Medicine) Rebekah Chesterfield, LCSW as Social Worker (Licensed Clinical Social Worker)  Indicate any recent [place] you may have received from other than [facility] providers in the past year (date may be approximate).     Assessment:   This is a routine wellness examination for [place].  Hearing/Vision screen Hearing Screening - Comments:: No trouble hearing Vision Screening - Comments:: [place] eye Up to date  Dietary issues and exercise activities discussed: Current Exercise Habits: The patient does not participate in regular exercise at present, Exercise limited by: None identified   Goals Addressed             This Visit's Progress    Weight (lb) < 200 lb (90.7 kg)         Depression Screen PHQ 2/9 Scores 09/11/2021 04/13/2021 02/17/2021 01/10/2021 11/03/2020 09/09/2020 07/11/2020  PHQ - 2 Score 4 2 6 3 2  0 3  PHQ- 9 Score 11 10 19 10 7  - 6  Some encounter information is confidential and restricted. Go to Review Flowsheets activity to see all data.    Fall Risk Fall Risk  09/11/2021 04/13/2021 10/26/2020 09/09/2020 07/11/2020  Falls in the past year? 0 0 0 0 0  Comment - - - - -  Number falls in past yr: 0 0 - - -  Injury with Fall? 0  0 - - -  Risk for fall due to : - No Fall Risks No Fall Risks Medication side effect -  Follow up Falls evaluation completed;Falls prevention discussed Falls evaluation completed Education provided;Falls prevention discussed Falls evaluation completed;Education provided;Falls prevention discussed -    FALL RISK PREVENTION PERTAINING TO THE HOME:  Any stairs in or around the home? Yes  If so, are there any without handrails? No  Home free of loose throw rugs in walkways, pet beds, electrical cords, etc? Yes  Adequate lighting in your home to reduce risk of falls? Yes   ASSISTIVE DEVICES  UTILIZED TO PREVENT FALLS:  Life alert? No  Use of a cane, walker or w/c? No  Grab bars in the bathroom? No  Shower chair or bench in shower? No  Elevated toilet seat or a handicapped toilet? No   TIMED UP AND GO:  Was the test performed? No .    Cognitive Function:     6CIT Screen 09/11/2021 09/09/2020 05/18/2019 02/19/2018  What Year? 0 points 0 points 0 points 0 points  What month? 0 points 0 points 0 points 0 points  What time? 0 points 0 points 0 points 0 points  Count back from 20 0 points 0 points 0 points 0 points  Months in reverse 0 points 4 points 0 points 0 points  Repeat phrase 4 points 8 points 0 points 2 points  Total Score 4 12 0 2    Immunizations Immunization History  Administered Date(s) Administered   Hepatitis B, adult 11/03/2018   Influenza,inj,Quad PF,6+ Mos 05/31/2016, 05/02/2017, 07/04/2017, 04/07/2018, 06/13/2018, 04/13/2019, 05/09/2020, 05/18/2021   Janssen (J&J) SARS-COV-2 Vaccination 08/27/2019, 12/25/2019   Pneumococcal Polysaccharide-23 07/04/2017   Td 04/07/2018    TDAP status: Up to date  Flu Vaccine status: Up to date  Pneumococcal vaccine status: Up to date  Covid-19 vaccine status: Information provided on how to obtain vaccines.   Qualifies for Shingles Vaccine? Yes   Zostavax completed No   Shingrix Completed?: No.    Education has been provided regarding the importance of this vaccine. Patient has been advised to call insurance company to determine out of pocket expense if they have not yet received this vaccine. Advised may also receive vaccine at local pharmacy or Health Dept. Verbalized acceptance and understanding.  Screening Tests Health Maintenance  Topic Date Due   COVID-19 Vaccine (3 - Booster for Janssen series) 09/27/2021 (Originally 02/19/2020)   Zoster Vaccines- Shingrix (1 of 2) 12/10/2021 (Originally 12/21/1982)   HEMOGLOBIN A1C  10/11/2021   OPHTHALMOLOGY EXAM  05/12/2022   FOOT EXAM  05/18/2022   MAMMOGRAM   07/12/2022   TETANUS/TDAP  04/07/2028   COLONOSCOPY (Pts 45-73yr Insurance coverage will need to be confirmed)  03/10/2029   INFLUENZA VACCINE  Completed   Hepatitis C Screening  Completed   HIV Screening  Completed   HPV VACCINES  Aged Out    Health Maintenance  There are no preventive care reminders to display for this patient.   Colorectal cancer screening: Type of screening: Colonoscopy. Completed 2020. Repeat every 10 years  Mammogram status: Completed  . Repeat every year    Lung Cancer Screening: (Low Dose CT Chest recommended if Age 58-80years, 30 pack-year currently smoking OR have quit w/in 15years.) does not qualify.   Lung Cancer Screening Referral:   Additional Screening:  Hepatitis C Screening: does not qualify; Completed 2019  Vision Screening: Recommended annual ophthalmology exams for early detection of glaucoma and other disorders of  the eye. Is the patient up to date with their annual eye exam?  Yes  Who is the provider or what is the name of the office in which the patient attends annual eye exams? [facility] If pt is not established with a provider, would they like to be referred to a provider to establish care? No .   Dental Screening: Recommended annual dental exams for proper oral hygiene  Community Resource Referral / Chronic Care Management: CRR required this visit?  No   CCM required this visit?  No      Plan:     I have personally reviewed and noted the following in the patients chart:   Medical and social history Use of alcohol, tobacco or illicit drugs  Current medications and supplements including opioid prescriptions.  Functional ability and status Nutritional status Physical activity Advanced directives List of other physicians Hospitalizations, surgeries, and ER visits in previous 12 months Vitals Screenings to include cognitive, depression, and falls Referrals and appointments  In addition, I have reviewed and  discussed with patient certain preventive protocols, quality metrics, and best practice recommendations. A written personalized care plan for preventive services as well as general preventive health recommendations were provided to patient.     Leroy Kennedy, LPN   0/32/1224   Nurse Notes:

## 2021-09-14 ENCOUNTER — Ambulatory Visit (INDEPENDENT_AMBULATORY_CARE_PROVIDER_SITE_OTHER): Payer: Medicare Other | Admitting: Family Medicine

## 2021-09-14 ENCOUNTER — Encounter: Payer: Self-pay | Admitting: Family Medicine

## 2021-09-14 ENCOUNTER — Other Ambulatory Visit: Payer: Self-pay

## 2021-09-14 VITALS — BP 106/72 | HR 85 | Temp 98.0°F | Wt 186.8 lb

## 2021-09-14 DIAGNOSIS — E039 Hypothyroidism, unspecified: Secondary | ICD-10-CM | POA: Diagnosis not present

## 2021-09-14 DIAGNOSIS — I129 Hypertensive chronic kidney disease with stage 1 through stage 4 chronic kidney disease, or unspecified chronic kidney disease: Secondary | ICD-10-CM

## 2021-09-14 DIAGNOSIS — N1831 Chronic kidney disease, stage 3a: Secondary | ICD-10-CM | POA: Diagnosis not present

## 2021-09-14 DIAGNOSIS — I428 Other cardiomyopathies: Secondary | ICD-10-CM | POA: Diagnosis not present

## 2021-09-14 DIAGNOSIS — F3132 Bipolar disorder, current episode depressed, moderate: Secondary | ICD-10-CM

## 2021-09-14 DIAGNOSIS — E1122 Type 2 diabetes mellitus with diabetic chronic kidney disease: Secondary | ICD-10-CM | POA: Diagnosis not present

## 2021-09-14 DIAGNOSIS — I200 Unstable angina: Secondary | ICD-10-CM | POA: Diagnosis not present

## 2021-09-14 DIAGNOSIS — K7469 Other cirrhosis of liver: Secondary | ICD-10-CM

## 2021-09-14 DIAGNOSIS — Z794 Long term (current) use of insulin: Secondary | ICD-10-CM

## 2021-09-14 DIAGNOSIS — I272 Pulmonary hypertension, unspecified: Secondary | ICD-10-CM

## 2021-09-14 DIAGNOSIS — E042 Nontoxic multinodular goiter: Secondary | ICD-10-CM | POA: Diagnosis not present

## 2021-09-14 DIAGNOSIS — I5022 Chronic systolic (congestive) heart failure: Secondary | ICD-10-CM

## 2021-09-14 LAB — MICROALBUMIN, URINE WAIVED
Creatinine, Urine Waived: 200 mg/dL (ref 10–300)
Microalb, Ur Waived: 150 mg/L — ABNORMAL HIGH (ref 0–19)
Microalb/Creat Ratio: >300 mg/g — ABNORMAL HIGH (ref <30)

## 2021-09-14 LAB — BAYER DCA HB A1C WAIVED: HB A1C (BAYER DCA - WAIVED): 12.9 % — ABNORMAL HIGH (ref 4.8–5.6)

## 2021-09-14 MED ORDER — EMPAGLIFLOZIN 25 MG PO TABS: 25.0000 mg | ORAL_TABLET | Freq: Every day | ORAL | 1 refills | Status: DC

## 2021-09-14 MED ORDER — OZEMPIC (0.25 OR 0.5 MG/DOSE) 2 MG/1.5ML ~~LOC~~ SOPN: 0.5000 mg | PEN_INJECTOR | SUBCUTANEOUS | 1 refills | Status: AC

## 2021-09-14 MED ORDER — PANTOPRAZOLE SODIUM 40 MG PO TBEC: 40.0000 mg | DELAYED_RELEASE_TABLET | Freq: Every day | ORAL | 1 refills | Status: DC

## 2021-09-14 MED ORDER — FAMOTIDINE 40 MG PO TABS
40.0000 mg | ORAL_TABLET | Freq: Every day | ORAL | 1 refills | Status: DC
Start: 2021-09-14 — End: 2022-02-06

## 2021-09-14 MED ORDER — LANTUS SOLOSTAR 100 UNIT/ML ~~LOC~~ SOPN: 45.0000 [IU] | PEN_INJECTOR | Freq: Every day | SUBCUTANEOUS | 1 refills | Status: DC

## 2021-09-14 NOTE — Assessment & Plan Note (Signed)
Under good control on current regimen. Continue current regimen. Continue to monitor. Call with any concerns. Refills given. Labs drawn today.   

## 2021-09-14 NOTE — Assessment & Plan Note (Signed)
Rechecking labs today. Await results. Treat as needed.  °

## 2021-09-14 NOTE — Assessment & Plan Note (Signed)
Continue to follow with cardiology. Due for a follow up. Will call to schedule.

## 2021-09-14 NOTE — Progress Notes (Signed)
BP 106/72    Pulse 85    Temp 98 F (36.7 C)    Wt 186 lb 12.8 oz (84.7 kg)    SpO2 98%    BMI 31.09 kg/m    Subjective:    Patient ID: Bonnie Ochoa, female    DOB: 1965/08/17, 56 y.o.   MRN: [id number]  HPI: Bonnie Ochoa is a 56 y.o. female  Chief Complaint  Patient presents with   Diabetes   Hypertension   Hypothyroidism   HYPERTENSION / [facility] Satisfied with current treatment? yes Duration of hypertension: chronic BP monitoring frequency: not checking BP medication side effects: no Duration of hyperlipidemia: chronic Cholesterol medication side effects: no Cholesterol supplements: none Past cholesterol medications: crestor Medication compliance: good compliance Aspirin: yes Recent stressors: no Recurrent headaches: no Visual changes: no Palpitations: no Dyspnea: no Chest pain: no Lower extremity edema: no Dizzy/lightheaded: no  DIABETES Hypoglycemic episodes:no Polydipsia/polyuria: no Visual disturbance: no Chest pain: no Paresthesias: no Glucose Monitoring: yes  Accucheck frequency: Not Checking Taking Insulin?: yes Blood Pressure Monitoring: not checking Retinal Examination: Up to Date Foot Exam: Up to Date Diabetic Education: Completed Pneumovax: Up to Date Influenza: Up to Date Aspirin: yes  HYPOTHYROIDISM Thyroid control status:controlled Satisfied with current treatment? yes Medication side effects: no Medication compliance: excellent compliance Etiology of hypothyroidism:  Recent dose adjustment:no Fatigue: no Cold intolerance: no Heat intolerance: no Weight gain: no Weight loss: no Constipation: no Diarrhea/loose stools: no Palpitations: no Lower extremity edema: no Anxiety/depressed mood: yes  Has not been seeing her psychiatrist. Is unsure what meds she has been taking. Not sure what's happening. Fine seeing a different psychiatrist in the same practice as previously.  Relevant past medical, surgical, family and social  history reviewed and updated as indicated. Interim medical history since our last visit reviewed. Allergies and medications reviewed and updated.  Review of Systems  Constitutional: Negative.   Respiratory: Negative.    Cardiovascular: Negative.   Gastrointestinal: Negative.   Musculoskeletal: Negative.   Neurological: Negative.   Psychiatric/Behavioral: Negative.     Per HPI unless specifically indicated above     Objective:    BP 106/72    Pulse 85    Temp 98 F (36.7 C)    Wt 186 lb 12.8 oz (84.7 kg)    SpO2 98%    BMI 31.09 kg/m   Wt Readings from Last 3 Encounters:  09/14/21 186 lb 12.8 oz (84.7 kg)  05/18/21 191 lb 9.6 oz (86.9 kg)  04/13/21 194 lb (88 kg)    Physical Exam Vitals and nursing note reviewed.  Constitutional:      General: She is not in acute distress.    Appearance: Normal appearance. She is not ill-appearing, toxic-appearing or diaphoretic.  HENT:     Head: Normocephalic and atraumatic.     Right Ear: External ear normal.     Left Ear: External ear normal.     Nose: Nose normal.     Mouth/Throat:     Mouth: Mucous membranes are moist.     Pharynx: Oropharynx is clear.  Eyes:     General: No scleral icterus.       Right eye: No discharge.        Left eye: No discharge.     Extraocular Movements: Extraocular movements intact.     Conjunctiva/sclera: Conjunctivae normal.     Pupils: Pupils are equal, round, and reactive to light.  Cardiovascular:     Rate and Rhythm: Normal rate and  regular rhythm.     Pulses: Normal pulses.     Heart sounds: Normal heart sounds. No murmur heard.   No friction rub. No gallop.  Pulmonary:     Effort: Pulmonary effort is normal. No respiratory distress.     Breath sounds: Normal breath sounds. No stridor. No wheezing, rhonchi or rales.  Chest:     Chest wall: No tenderness.  Musculoskeletal:        General: Normal range of motion.     Cervical back: Normal range of motion and neck supple.  Skin:    General:  Skin is warm and dry.     Capillary Refill: Capillary refill takes less than 2 seconds.     Coloration: Skin is not jaundiced or pale.     Findings: No bruising, erythema, lesion or rash.  Neurological:     General: No focal deficit present.     Mental Status: She is alert and oriented to person, place, and time. Mental status is at baseline.  Psychiatric:        Mood and Affect: Mood normal.        Behavior: Behavior normal.        Thought Content: Thought content normal.        Judgment: Judgment normal.    Results for orders placed or performed in visit on 09/14/21  Bayer DCA Hb A1c Waived  Result Value Ref Range   HB A1C (BAYER DCA - WAIVED) 12.9 (H) 4.8 - 5.6 %  Microalbumin, Urine Waived  Result Value Ref Range   Microalb, Ur Waived 150 (H) 0 - 19 mg/L   Creatinine, Urine Waived 200 10 - 300 mg/dL   Microalb/Creat Ratio >300 (H) <30 mg/g      Assessment & Plan:   Problem List Items Addressed This Visit       Cardiovascular and Mediastinum   Chronic systolic heart failure (HCC) (Chronic)    Continue to follow with cardiology. Due for a follow up. Will call to schedule.       Pulmonary hypertension ([place])    Continue to follow with cardiology. Due for a follow up. Will call to schedule.       NICM (nonischemic cardiomyopathy) ([place])    Continue to follow with cardiology. Due for a follow up. Will call to schedule.       Unstable angina (HCC)    Continue to follow with cardiology. Due for a follow up. Will call to schedule.         Digestive   Cirrhosis ([place])    Rechecking labs today. Await results. On diuretics. Continue to monitor.         Endocrine   DM (diabetes mellitus), type 2 with renal complications ([place])    Not doing well with A1c of 12.9. Will continue current regimen and add ozempic. Recheck 2 months. Call with any concerns.       Relevant Medications   insulin glargine (LANTUS SOLOSTAR) 100 UNIT/ML Solostar Pen   empagliflozin (JARDIANCE) 25  MG TABS tablet   Semaglutide,0.25 or 0.5MG /DOS, (OZEMPIC, 0.25 OR 0.5 MG/DOSE,) 2 MG/1.5ML SOPN   Other Relevant Orders   Comprehensive metabolic panel   CBC with Differential/Platelet   Bayer DCA Hb A1c Waived (Completed)   Lipid Panel w/o Chol/HDL Ratio   Microalbumin, Urine Waived (Completed)   Multiple thyroid nodules    Rechecking labs today. Await results. Treat as needed.       Hypothyroid    Rechecking labs today.  Await results. Treat as needed.       Relevant Orders   Comprehensive metabolic panel   CBC with Differential/Platelet   TSH     Genitourinary   Benign hypertensive renal disease - Primary    Under good control on current regimen. Continue current regimen. Continue to monitor. Call with any concerns. Refills given. Labs drawn today.       Relevant Orders   Comprehensive metabolic panel   CBC with Differential/Platelet   Microalbumin, Urine Waived (Completed)     Other   Bipolar disorder, current episode depressed, moderate ([place])    Has not been in to see psychiatry. Will try to get her back in. Await their input. Stable for now.         Follow up plan: Return in about 2 months (around 11/12/2021).

## 2021-09-14 NOTE — Assessment & Plan Note (Signed)
Rechecking labs today. Await results. On diuretics. Continue to monitor.

## 2021-09-14 NOTE — Assessment & Plan Note (Signed)
Continue to follow with cardiology. Due for a follow up. Will call to schedule.  °

## 2021-09-14 NOTE — Patient Instructions (Signed)
Call to reschedule your appointment with Dr. Rockey Situ  [id number], [facility] [address] [place], [place], [place] [address] Phone: [phone]  Call to schedule your appointment with Dr. Modesta Messing (different psychiatrist in same office) [facility] [id number], [place], [place] [address] Phone: [phone]

## 2021-09-14 NOTE — Assessment & Plan Note (Signed)
Not doing well with A1c of 12.9. Will continue current regimen and add ozempic. Recheck 2 months. Call with any concerns.

## 2021-09-14 NOTE — Assessment & Plan Note (Signed)
Has not been in to see psychiatry. Will try to get her back in. Await their input. Stable for now.

## 2021-09-15 LAB — COMPREHENSIVE METABOLIC PANEL
ALT: 15 IU/L (ref 0–32)
AST: 11 IU/L (ref 0–40)
Albumin/Globulin Ratio: 1.1 — ABNORMAL LOW (ref 1.2–2.2)
Albumin: 4.4 g/dL (ref 3.8–4.9)
Alkaline Phosphatase: 99 IU/L (ref 44–121)
BUN/Creatinine Ratio: 16 (ref 9–23)
BUN: 21 mg/dL (ref 6–24)
Bilirubin Total: 0.7 mg/dL (ref 0.0–1.2)
CO2: 20 mmol/L (ref 20–29)
Calcium: 9.9 mg/dL (ref 8.7–10.2)
Chloride: 93 mmol/L — ABNORMAL LOW (ref 96–106)
Creatinine, Ser: 1.33 mg/dL — ABNORMAL HIGH (ref 0.57–1.00)
Globulin, Total: 3.9 g/dL (ref 1.5–4.5)
Glucose: 438 mg/dL — ABNORMAL HIGH (ref 70–99)
Potassium: 4.4 mmol/L (ref 3.5–5.2)
Sodium: 130 mmol/L — ABNORMAL LOW (ref 134–144)
Total Protein: 8.3 g/dL (ref 6.0–8.5)
eGFR: 47 mL/min/{1.73_m2} — ABNORMAL LOW (ref >59)

## 2021-09-15 LAB — LIPID PANEL W/O CHOL/HDL RATIO
Cholesterol, Total: 172 mg/dL (ref 100–199)
HDL: 32 mg/dL — ABNORMAL LOW (ref >39)
LDL Chol Calc (NIH): 86 mg/dL (ref 0–99)
Triglycerides: 326 mg/dL — ABNORMAL HIGH (ref 0–149)
VLDL Cholesterol Cal: 54 mg/dL — ABNORMAL HIGH (ref 5–40)

## 2021-09-15 LAB — CBC WITH DIFFERENTIAL/PLATELET
Basophils Absolute: 0 10*3/uL (ref 0.0–0.2)
Basos: 1 %
EOS (ABSOLUTE): 0.1 10*3/uL (ref 0.0–0.4)
Eos: 2 %
Hematocrit: 43.4 % (ref 34.0–46.6)
Hemoglobin: 13.9 g/dL (ref 11.1–15.9)
Immature Grans (Abs): 0 10*3/uL (ref 0.0–0.1)
Immature Granulocytes: 0 %
Lymphocytes Absolute: 2.8 10*3/uL (ref 0.7–3.1)
Lymphs: 44 %
MCH: 28.8 pg (ref 26.6–33.0)
MCHC: 32 g/dL (ref 31.5–35.7)
MCV: 90 fL (ref 79–97)
Monocytes Absolute: 0.6 10*3/uL (ref 0.1–0.9)
Monocytes: 9 %
Neutrophils Absolute: 2.8 10*3/uL (ref 1.4–7.0)
Neutrophils: 44 %
Platelets: 217 10*3/uL (ref 150–450)
RBC: 4.82 x10E6/uL (ref 3.77–5.28)
RDW: 12.2 % (ref 11.7–15.4)
WBC: 6.3 10*3/uL (ref 3.4–10.8)

## 2021-09-15 LAB — TSH: TSH: 3.03 u[IU]/mL (ref 0.450–4.500)

## 2021-09-20 DIAGNOSIS — E119 Type 2 diabetes mellitus without complications: Secondary | ICD-10-CM | POA: Diagnosis not present

## 2021-09-20 DIAGNOSIS — H43813 Vitreous degeneration, bilateral: Secondary | ICD-10-CM | POA: Diagnosis not present

## 2021-09-20 LAB — HM DIABETES EYE EXAM

## 2021-10-01 ENCOUNTER — Other Ambulatory Visit: Payer: Self-pay | Admitting: Cardiovascular Disease

## 2021-10-02 ENCOUNTER — Other Ambulatory Visit: Payer: Self-pay | Admitting: Cardiovascular Disease

## 2021-10-17 ENCOUNTER — Ambulatory Visit (INDEPENDENT_AMBULATORY_CARE_PROVIDER_SITE_OTHER): Payer: Medicare Other | Admitting: Cardiovascular Disease

## 2021-10-17 ENCOUNTER — Ambulatory Visit (INDEPENDENT_AMBULATORY_CARE_PROVIDER_SITE_OTHER): Payer: Medicare Other

## 2021-10-17 ENCOUNTER — Encounter: Payer: Self-pay | Admitting: Cardiovascular Disease

## 2021-10-17 ENCOUNTER — Other Ambulatory Visit: Payer: Self-pay

## 2021-10-17 VITALS — BP 90/64 | HR 88 | Ht 65.0 in | Wt 185.0 lb

## 2021-10-17 DIAGNOSIS — I10 Essential (primary) hypertension: Secondary | ICD-10-CM | POA: Diagnosis not present

## 2021-10-17 DIAGNOSIS — I200 Unstable angina: Secondary | ICD-10-CM

## 2021-10-17 DIAGNOSIS — I5022 Chronic systolic (congestive) heart failure: Secondary | ICD-10-CM | POA: Diagnosis not present

## 2021-10-17 DIAGNOSIS — N182 Chronic kidney disease, stage 2 (mild): Secondary | ICD-10-CM

## 2021-10-17 DIAGNOSIS — Z95810 Presence of automatic (implantable) cardiac defibrillator: Secondary | ICD-10-CM | POA: Diagnosis not present

## 2021-10-17 DIAGNOSIS — G4733 Obstructive sleep apnea (adult) (pediatric): Secondary | ICD-10-CM | POA: Diagnosis not present

## 2021-10-17 DIAGNOSIS — I428 Other cardiomyopathies: Secondary | ICD-10-CM | POA: Diagnosis not present

## 2021-10-17 NOTE — Patient Instructions (Addendum)
Medication Instructions:  ?No changes ? ?If you need a refill on your cardiac medications before your next appointment, please call your pharmacy.  ? ?Lab work: ?No new labs needed ? ?Testing/Procedures: ?Your physician has requested that you have an echocardiogram. Echocardiography is a painless test that uses sound waves to create images of your heart. It provides your doctor with information about the size and shape of your heart and how well your heart?s chambers and valves are working. This procedure takes approximately one hour. There are no restrictions for this procedure. ? ?There is a possibility that an IV may need to be started during your test to inject an image enhancing agent. This is done to obtain more optimal pictures of your heart. Therefore we ask that you do at least drink some water prior to coming in to hydrate your veins.  ? ? ?Follow-Up: ?At [facility], you and your health needs are our priority.  As part of our continuing mission to provide you with exceptional heart care, we have created designated Provider Care Teams.  These Care Teams include your primary Cardiologist (physician) and Advanced Practice Providers (APPs -  Physician Assistants and Nurse Practitioners) who all work together to provide you with the care you need, when you need it. ? ?You will need a follow up appointment in 3 months ? ?Providers on your designated Care Team:   ?Nicolasa Ducking, NP ?Eula Listen, PA-C ?Cadence Fransico Michael, PA-C ? ?COVID-19 Vaccine Information can be found at: [email or web address] For questions related to vaccine distribution or appointments, please email [email or web address] or call [phone].  ? ?

## 2021-10-17 NOTE — Progress Notes (Signed)
Cardiology Office Note  Date:  10/17/2021   ID:  Bonnie Ochoa, DOB 08-Feb-1966, MRN [id number]  PCP:  Valerie Roys, DO   Chief Complaint  Patient presents with   6 month follow up     "Doing well." Medications reviewed by the patient verbally.     HPI:  Bonnie Ochoa is a 57 yo woman with PMH Medical and appt noncompliance, Substance abuse-chronic /marijuana bipolar,  anxiety diabetes, hypertension,  asthma,  Obstructive sleep apnea, restless leg syndrome Nonischemic cardiomyopathy,  history of ICD,  Cath 2019: no CAD ejection fraction 25%, 05/2018 EF  25 to 30% on 12/21 anemia Previous alcohol problem  ascites, cirrhosis, History of paracentesis Previously on pain medication at home, oxycodone She presents today for follow-up of her chronic systolic CHF  Seen by CHF clinic [facility] 6/22 Directions were to continue medications as below Daughter who does the medications in a pillbox is not with her today - Continue spironolactone to 25 mg daily.  - Continue carvedilol 12.5 mg twice a day. - Continue digoxin 0.125 mg daily.  - Continue Jardiance 25 daily  - Change Lasix 20 mg to M/W/F, OK to take extra if needed - Continue Entresto 97/103 bid mg  Reports feeling well overall Developed some left arm pain, shoulder to biceps, x 2 days Better now   Did not do ECHO yet, ordered June 2022  She reports that she was started on Ozempic by primary care, this is not on the medication list Long discussion concerning her worsening diabetes, A1c greater than 12  Labs reviewed HGBA1C 12.9 Total chol 303 to 172 LDL 84  ECHO 07/2020 Ef 25 to 30-%  EKG personally reviewed by myself on todays visit Normal sinus rhythm rate 88 bpm nonspecific ST abnormality  Other past medical history reviewed hospitalization October 25, 2018 for renal failure CR 2.15 Felt to be secondary to dehydration and improved with IV fluids She was continued on Lasix at discharge   Other past medical  history reviewed Previous hospitalization November 2018 for hyperglycemia nonketotic coma She had acute renal failure and hyponatremia  Hospital admission 09/10/16 numerous sx on arrival to ER chest pain, shortness of breath, abdominal pain, dysuria, urinary frequency, syncope 2 today with lightheadedness, nausea and vomiting, and diarrhea , sob TBili 2.4, had paracentesis, diuresis D/c on lasix 60 BID   Hospital admission 09/17/16: chest pain, ABD pain fevers, chills, chest pain, shortness of breath, vomiting and diarrhea. "ran out of meds" HTN, acute on chronic systolic CHF   Seen in the emergency room 05/19/2016 for abdominal pain, chest pain BNP in the hospital 3700  acute on chronic CHF, Had 20 L diuresis   CT scan consistent with moderate abdominal ascites, fatty liver Aortic atherosclerosis   PMH:   has a past medical history of Abdominal pain, ADHD, AICD (automatic cardioverter/defibrillator) present, Anemia, Arthritis, Asthma, Bipolar 1 disorder ([place]), Chest pain, CHF (congestive heart failure) ([place]), Cirrhosis of liver ([place]), Coronary artery disease, Depression, Diabetes mellitus without complication ([place]), Diverticulitis, Dysrhythmia, Heart murmur, HFrEF (heart failure with reduced ejection fraction) ([place]), Hypertension, Hypothyroidism, IBS (irritable bowel syndrome), Insomnia, Migraines, NICM (nonischemic cardiomyopathy) ([place]), OSA (obstructive sleep apnea), PTSD (post-traumatic stress disorder), Restless leg syndrome, and Vertigo.  PSH:    Past Surgical History:  Procedure Laterality Date   ABDOMINAL HYSTERECTOMY     CARDIAC CATHETERIZATION     CATARACT EXTRACTION W/PHACO Right 01/27/2019   Procedure: CATARACT EXTRACTION PHACO AND INTRAOCULAR LENS PLACEMENT (IOC)  VISION BLUE RIGHT  DIABETES;  Surgeon: Birder Robson, MD;  Location: [facility];  Service: Ophthalmology;  Laterality: Right;  Diabetes - insulin sleep apnea   CATARACT EXTRACTION W/PHACO Left 03/08/2020    Procedure: CATARACT EXTRACTION PHACO AND INTRAOCULAR LENS PLACEMENT (IOC) LEFT DIABETIC 10.28  00:57.8;  Surgeon: Birder Robson, MD;  Location: [facility];  Service: Ophthalmology;  Laterality: Left;   COLONOSCOPY WITH PROPOFOL N/A 03/11/2019   Procedure: COLONOSCOPY WITH PROPOFOL;  Surgeon: Virgel Manifold, MD;  Location: [facility];  Service: Endoscopy;  Laterality: N/A;   CORONARY ARTERY BYPASS GRAFT     Pt denies   ESOPHAGOGASTRODUODENOSCOPY (EGD) WITH PROPOFOL N/A 03/11/2019   Procedure: ESOPHAGOGASTRODUODENOSCOPY (EGD) WITH PROPOFOL;  Surgeon: Virgel Manifold, MD;  Location: [facility];  Service: Endoscopy;  Laterality: N/A;   INSERT / REPLACE / REMOVE PACEMAKER     ICD   INSERTION OF ICD  2016   St Jude Single chamber ICD   RIGHT/LEFT HEART CATH AND CORONARY ANGIOGRAPHY N/A 06/09/2018   Procedure: RIGHT/LEFT HEART CATH AND CORONARY ANGIOGRAPHY;  Surgeon: Wellington Hampshire, MD;  Location: [facility];  Service: Cardiovascular;  Laterality: N/A;   TONSILLECTOMY     TUBAL LIGATION  1980    Current Outpatient Medications  Medication Sig Dispense Refill   blood glucose meter kit and supplies KIT Dispense based on patient and insurance preference. Use up to four times daily as directed. (FOR ICD-9 250.00, 250.01). 1 each 0   carvedilol (COREG) 12.5 MG tablet TAKE 1 TABLET (12.5 MG TOTAL) BY MOUTH 2 (TWO) TIMES DAILY WITH A MEAL. 60 tablet 0   digoxin (LANOXIN) 0.125 MG tablet TAKE 1 TABLET BY MOUTH DAILY 90 tablet 3   empagliflozin (JARDIANCE) 25 MG TABS tablet Take 1 tablet (25 mg total) by mouth daily. 90 tablet 1   famotidine (PEPCID) 40 MG tablet Take 1 tablet (40 mg total) by mouth daily. 90 tablet 1   furosemide (LASIX) 20 MG tablet Take 1 tablet (20 mg total) by mouth as directed. Take 19m (1 tablet) Monday, Wednesday, Friday. 40 tablet 3   insulin glargine (LANTUS SOLOSTAR) 100 UNIT/ML Solostar Pen Inject 45 Units into the skin daily at 10  pm. 45 mL 1   Insulin Pen Needle 32G X 6 MM MISC 1 each by Does not apply route daily. 100 each 12   lamoTRIgine (LAMICTAL) 150 MG tablet TAKE 1 TABLET BY MOUTH EVERY DAY 30 tablet 0   metoCLOPramide (REGLAN) 10 MG tablet Take 10 mg by mouth 4 (four) times daily.     pantoprazole (PROTONIX) 40 MG tablet Take 1 tablet (40 mg total) by mouth daily. 90 tablet 1   PARoxetine (PAXIL) 30 MG tablet Take 1 tablet (30 mg total) by mouth daily. 90 tablet 1   potassium chloride SA (KLOR-CON M20) 20 MEQ tablet TAKE 2 TABLETS BY MOUTH TWICE A DAY 120 tablet 0   risperiDONE (RISPERDAL) 0.25 MG tablet TAKE 1 TABLET (0.25 MG TOTAL) BY MOUTH EVERY MORNING. 90 tablet 1   rosuvastatin (CRESTOR) 40 MG tablet Take 1 tablet (40 mg total) by mouth daily. 90 tablet 3   sacubitril-valsartan (ENTRESTO) 97-103 MG Take 1 tablet by mouth 2 (two) times daily. 60 tablet 11   spironolactone (ALDACTONE) 25 MG tablet TAKE 1 TABLET (25 MG TOTAL) BY MOUTH DAILY. 30 tablet 0   sucralfate (CARAFATE) 1 g tablet TAKE 1 TABLET (1 G TOTAL) BY MOUTH 4 (FOUR) TIMES DAILY. 360 tablet 3   cyclobenzaprine (FLEXERIL)  5 MG tablet TAKE 1 TABLET BY MOUTH THREE TIMES A DAY AS NEEDED FOR MUSCLE SPASMS (Patient not taking: Reported on 09/11/2021) 60 tablet 1   zolpidem (AMBIEN) 10 MG tablet TAKE 1 TABLET BY MOUTH AT BEDTIME AS NEEDED FOR SLEEP. (Patient not taking: Reported on 09/14/2021) 21 tablet 0   No current facility-administered medications for this visit.     Allergies:   Levothyroxine   Social History:  The patient  reports that she quit smoking about 38 years ago. Her smoking use included cigarettes. She has never used smokeless tobacco. She reports that she does not currently use alcohol. She reports current drug use. Frequency: 1.00 time per week. Drug: Marijuana.   Family History:   family history includes Alzheimer's disease in her maternal grandfather; Asthma in her sister and sister; Breast cancer in her paternal aunt; Cancer in her  sister; Diabetes in her sister and sister; Heart disease in her mother and sister; Hyperlipidemia in her brother and mother; Hypertension in her mother, sister, and sister.    Review of Systems: Review of Systems  Constitutional: Negative.   HENT: Negative.    Respiratory: Negative.    Cardiovascular: Negative.   Gastrointestinal: Negative.   Musculoskeletal: Negative.   Neurological: Negative.   Psychiatric/Behavioral: Negative.    All other systems reviewed and are negative.  PHYSICAL EXAM: VS:  BP (!) 80/64 (BP Location: Left Arm, Patient Position: Sitting, Cuff Size: Normal)    Pulse 88    Ht 5' 5"  (1.651 m)    Wt 185 lb (83.9 kg)    SpO2 97%    BMI 30.79 kg/m  , BMI Body mass index is 30.79 kg/m.  Constitutional:  oriented to person, place, and time. No distress.  HENT:  Head: Grossly normal Eyes:  no discharge. No scleral icterus.  Neck: No JVD, no carotid bruits  Cardiovascular: Regular rate and rhythm, no murmurs appreciated Pulmonary/Chest: Clear to auscultation bilaterally, no wheezes or rails Abdominal: Soft.  no distension.  no tenderness.  Musculoskeletal: Normal range of motion Neurological:  normal muscle tone. Coordination normal. No atrophy Skin: Skin warm and dry Psychiatric: normal affect, pleasant   Recent Labs: 01/18/2021: B Natriuretic Peptide 39.1 09/14/2021: ALT 15; BUN 21; Creatinine, Ser 1.33; Hemoglobin 13.9; Platelets 217; Potassium 4.4; Sodium 130; TSH 3.030    Lipid Panel Lab Results  Component Value Date   CHOL 172 09/14/2021   HDL 32 (L) 09/14/2021   LDLCALC 86 09/14/2021   TRIG 326 (H) 09/14/2021      Wt Readings from Last 3 Encounters:  10/17/21 185 lb (83.9 kg)  09/14/21 186 lb 12.8 oz (84.7 kg)  05/18/21 191 lb 9.6 oz (86.9 kg)     ASSESSMENT AND PLAN:  Problem List Items Addressed This Visit       Cardiology Problems   Chronic systolic heart failure (HCC) - Primary (Chronic)   Relevant Orders   EKG 12-Lead   NICM  (nonischemic cardiomyopathy) ([place])   Relevant Orders   EKG 12-Lead   Other Visit Diagnoses     ICD (implantable cardioverter-defibrillator) in place       OSA (obstructive sleep apnea)       Essential hypertension       CKD (chronic kidney disease), stage II       Relevant Orders   EKG 12-Lead     Nonischemic cardiomyopathy EF with improvement 25 to 30% Stressed importance for medication compliance, discussed each of the medications with her but she reports  that sister does all the medications in a pillbox Refills provided, echocardiogram has been previously ordered and will try to schedule Our office has reached out to her in the past on several occasions to schedule  Diabetes type 2 with complications Hemoglobin A1c dramatically worsened, concerning for medication noncompliance Stressed importance of talk about her diabetes medications with her sister does her medications Discussed the complications such as DKA Reports that she is on Ozempic  Bipolar 1 disorder/PTSD Followed by PMD Reports that things are stable  Cirrhosis Followed by GI,  Recommend alcohol cessation    Total encounter time more than 30 minutes  Greater than 50% was spent in counseling and coordination of care with the patient    Signed, Esmond Plants, M.D., Ph.D. [place], [place]

## 2021-10-18 LAB — ECHOCARDIOGRAM COMPLETE
AR max vel: 1.66 cm2
AV Area VTI: 1.64 cm2
AV Area mean vel: 1.63 cm2
AV Mean grad: 4 mmHg
AV Peak grad: 6.6 mmHg
Ao pk vel: 1.28 m/s
Area-P 1/2: 5.54 cm2
Calc EF: 32.7 %
Height: 65 in
S' Lateral: 6.1 cm
Single Plane A2C EF: 33.6 %
Single Plane A4C EF: 31.2 %
Weight: 2960 oz

## 2021-10-24 ENCOUNTER — Other Ambulatory Visit: Payer: Self-pay

## 2021-10-24 ENCOUNTER — Encounter: Payer: Self-pay | Admitting: Psychiatry

## 2021-10-24 ENCOUNTER — Ambulatory Visit (INDEPENDENT_AMBULATORY_CARE_PROVIDER_SITE_OTHER): Payer: Medicare Other | Admitting: Psychiatry

## 2021-10-24 DIAGNOSIS — I200 Unstable angina: Secondary | ICD-10-CM

## 2021-10-24 DIAGNOSIS — F410 Panic disorder [episodic paroxysmal anxiety] without agoraphobia: Secondary | ICD-10-CM | POA: Diagnosis not present

## 2021-10-24 DIAGNOSIS — F4312 Post-traumatic stress disorder, chronic: Secondary | ICD-10-CM | POA: Diagnosis not present

## 2021-10-24 DIAGNOSIS — F1210 Cannabis abuse, uncomplicated: Secondary | ICD-10-CM

## 2021-10-24 DIAGNOSIS — F3131 Bipolar disorder, current episode depressed, mild: Secondary | ICD-10-CM | POA: Diagnosis not present

## 2021-10-24 DIAGNOSIS — Z79899 Other long term (current) drug therapy: Secondary | ICD-10-CM | POA: Insufficient documentation

## 2021-10-24 DIAGNOSIS — F5105 Insomnia due to other mental disorder: Secondary | ICD-10-CM

## 2021-10-24 MED ORDER — LAMOTRIGINE 25 MG PO TABS: 25.0000 mg | ORAL_TABLET | Freq: Two times a day (BID) | ORAL | 0 refills | Status: DC

## 2021-10-24 MED ORDER — LAMOTRIGINE 25 MG PO TABS: ORAL_TABLET | ORAL | 0 refills | Status: DC

## 2021-10-24 MED ORDER — MIRTAZAPINE 7.5 MG PO TABS: 7.5000 mg | ORAL_TABLET | Freq: Every day | ORAL | 1 refills | Status: DC

## 2021-10-24 NOTE — Progress Notes (Deleted)
error 

## 2021-10-24 NOTE — Progress Notes (Signed)
Virtual Visit via Video Note ? ?I connected with Bonnie Ochoa on 10/24/21 at  2:00 PM EDT by a video enabled telemedicine application and verified that I am speaking with the correct person using two identifiers. ? ?Location ?Provider Location : [facility] ?Patient Location : Home ? ?Participants: Patient ,Sister Bonnie Ochoa, Provider ? ?  ?I discussed the limitations of evaluation and management by telemedicine and the availability of in person appointments. The patient expressed understanding and agreed to proceed. ?  ?I discussed the assessment and treatment plan with the patient. The patient was provided an opportunity to ask questions and all were answered. The patient agreed with the plan and demonstrated an understanding of the instructions. ?  ?The patient was advised to call back or seek an in-person evaluation if the symptoms worsen or if the condition fails to improve as anticipated. ? ? ? ? ? ?BH MD OP Progress Note ? ?10/24/2021 3:36 PM ?Bonnie Ochoa  ?MRN:  [id number] ? ?Chief Complaint:  ?Chief Complaint  ?Patient presents with  ? Follow-up: 56 year old African-American female with bipolar disorder, PTSD, panic disorder, multiple medical problems including diabetes mellitus, cirrhosis of liver, presented for medication management.  ? ?HPI: Bonnie Ochoa is a 56 year old African-American female, engaged, lives in [place], has a history of bipolar disorder, PTSD, cannabis use disorder, insomnia, OSA on CPAP, RLS, diabetes, cirrhosis of liver, chronic renal failure, congestive heart failure, hypertension was evaluated by telemedicine today. ? ?Patient's last visit was on 10/11/2020.  Patient has been noncompliant with medications as well as follow-up appointments. ? ?Patient today returns reporting worsening mood symptoms.  She reports episodes of feeling sad, crying spells several times a week.  She also reports irritability, mood swings.  Patient reports having anxiety attacks or panic attacks a few times  a week which could last for a minute or so.  This happens without any triggers.  Patient reports her medications in the past helped her.  She is also interested in getting back in psychotherapy. ? ?Patient reports she struggles with sleep.  She reports since sleep is affected she has been having worsening headaches and as per her primary care provider headaches likely triggered by lack of sleep. ? ?Patient reports lack of appetite, may have lost 10 to 15 pounds in the past few months.  Does have uncontrolled diabetes melitis recently started on Ozempic. ? ?Patient reports she continues to use cannabinoid products, mostly CBD.  Has cut back to using it a couple of times a week only. ? ?Patient reports headaches, likely triggered by her lack of sleep.  She also has abdominal cramps which has been ongoing since the past few weeks, he agrees to get in touch with the primary care provider. ? ?Collateral information was obtained from sister who was present in session who reports patient as having worsening mood symptoms and sleep problems. ? ?Patient denies any suicidality, homicidality or perceptual disturbances. ? ?Patient denies any other concerns today. ? ?Visit Diagnosis:  ?  ICD-10-CM   ?1. Bipolar 1 disorder, depressed, mild (HCC)  F31.31 lamoTRIgine (LAMICTAL) 25 MG tablet  ?  lamoTRIgine (LAMICTAL) 25 MG tablet  ?  mirtazapine (REMERON) 7.5 MG tablet  ?  Drugs of Abuse Scr ONLY, 10,WB  ?  Sodium  ?  ?2. Chronic post-traumatic stress disorder (PTSD)  F43.12 mirtazapine (REMERON) 7.5 MG tablet  ?  Sodium  ?  ?3. Panic disorder  F41.0 mirtazapine (REMERON) 7.5 MG tablet  ?  Drugs of Abuse Scr ONLY, 10,WB  ?  Sodium  ?  ?4. Cannabis use disorder, mild, abuse  F12.10 Drugs of Abuse Scr ONLY, 10,WB  ?  ?5. Insomnia due to mental condition  F51.05 mirtazapine (REMERON) 7.5 MG tablet  ? mood  ?  ?6. High risk medication use  Z79.899 Drugs of Abuse Scr ONLY, 10,WB  ?  Sodium  ?  ? ? ?Past Psychiatric History: Reviewed  past psychiatric history from progress note on 10/16/2017.  Past trials of medications like risperidone, trazodone, Ambien, Lexapro, Lamictal, Seroquel. ? ?Past Medical History:  ?Past Medical History:  ?Diagnosis Date  ? Abdominal pain   ? a. 05/2018 HIDA scan wnl.  ? ADHD   ? AICD (automatic cardioverter/defibrillator) present   ? a. 11/2014 s/p SJM Fortify Assura, single lead AICD (ser# [id number]).  ? Anemia   ? Arthritis   ? Asthma   ? Bipolar 1 disorder (HCC)   ? Chest pain   ? a. Hx of cath in [place] - reportedly nl; b. 04/2018 MV: EF 22%, fixed dist ant septal, apical, and inferoapical defects - ? scar vs. attenuation. No ischemia.  ? CHF (congestive heart failure) (HCC)   ? Cirrhosis of liver (HCC)   ? Coronary artery disease   ? Depression   ? Diabetes mellitus without complication (HCC)   ? Diverticulitis   ? Dysrhythmia   ? Heart murmur   ? HFrEF (heart failure with reduced ejection fraction) (HCC)   ? a. 06/2017 Echo: EF 20-25%, diff HK. Mildly dil LA; b. 05/2018 Echo: EF 25-30%, diff HK, Gr2 DD. Triv AI. Mod MR. Mildly reduced RV fxn. Mod-Sev TR. PASP 45-71mmHg.  ? Hypertension   ? Hypothyroidism   ? IBS (irritable bowel syndrome)   ? Insomnia   ? Migraines   ? NICM (nonischemic cardiomyopathy) (HCC)   ? a. EF prev 25%; b. 11/2014 s/p SJM Fortify Assura, single lead AICD (ser# [id number]); c. 06/2017 Echo: EF 20-25%; d. 05/2018 Echo: EF 25-30%, diff HK, Gr2 DD; e. 05/2018 Echo: EF 25-30%; f. 05/2018 Cath: Nl cors. LVEDP , PCWP . PA 65/40 (52). CO/CI 3.04/1.56; c. 07/2018 CPX: Mod HF limitation.  ? OSA (obstructive sleep apnea)   ? CPAP is broken  ? PTSD (post-traumatic stress disorder)   ? Restless leg syndrome   ? Vertigo   ?  ?Past Surgical History:  ?Procedure Laterality Date  ? ABDOMINAL HYSTERECTOMY    ? CARDIAC CATHETERIZATION    ? CATARACT EXTRACTION W/PHACO Right 01/27/2019  ? Procedure: CATARACT EXTRACTION PHACO AND INTRAOCULAR LENS PLACEMENT (IOC)  VISION BLUE RIGHT DIABETES;  Surgeon: Galen Manila, MD;  Location: [facility];  Service: Ophthalmology;  Laterality: Right;  Diabetes - insulin ?sleep apnea  ? CATARACT EXTRACTION W/PHACO Left 03/08/2020  ? Procedure: CATARACT EXTRACTION PHACO AND INTRAOCULAR LENS PLACEMENT (IOC) LEFT DIABETIC 10.28  00:57.8;  Surgeon: Galen Manila, MD;  Location: [facility];  Service: Ophthalmology;  Laterality: Left;  ? COLONOSCOPY WITH PROPOFOL N/A 03/11/2019  ? Procedure: COLONOSCOPY WITH PROPOFOL;  Surgeon: Pasty Spillers, MD;  Location: [facility] END[facility];  Service: Endoscopy;  Laterality: N/A;  ? CORONARY ARTERY BYPASS GRAFT    ? Pt denies  ? ESOPHAGOGASTRODUODENOSCOPY (EGD) WITH PROPOFOL N/A 03/11/2019  ? Procedure: ESOPHAGOGASTRODUODENOSCOPY (EGD) WITH PROPOFOL;  Surgeon: Pasty Spillers, MD;  Location: [facility];  Service: Endoscopy;  Laterality: N/A;  ? INSERT / REPLACE / REMOVE PACEMAKER    ? ICD  ? INSERTION OF ICD  2016  ? St Jude Single chamber ICD  ?  RIGHT/LEFT HEART CATH AND CORONARY ANGIOGRAPHY N/A 06/09/2018  ? Procedure: RIGHT/LEFT HEART CATH AND CORONARY ANGIOGRAPHY;  Surgeon: Iran Ouch, MD;  Location: [facility];  Service: Cardiovascular;  Laterality: N/A;  ? TONSILLECTOMY    ? TUBAL LIGATION  1980  ? ? ?Family Psychiatric History: Reviewed family psychiatric history from progress note on 10/16/2017. ? ?Family History:  ?Family History  ?Problem Relation Age of Onset  ? Hypertension Mother   ? Hyperlipidemia Mother   ? Heart disease Mother   ? Hypertension Sister   ? Asthma Sister   ? Heart disease Sister   ? Diabetes Sister   ? Cancer Sister   ? Alzheimer's disease Maternal Grandfather   ? Hyperlipidemia Brother   ? Asthma Sister   ? Hypertension Sister   ? Diabetes Sister   ? Breast cancer Paternal Aunt   ? ? ?Social History: Reviewed social history from progress note on 10/16/2017. ?Social History  ? ?Socioeconomic History  ? Marital status: Married  ?  Spouse name: Not on file  ? Number of children: 2  ?  Years of education: Not on file  ? Highest education level: High school graduate  ?Occupational History  ?  Comment: disabled  ?Tobacco Use  ? Smoking status: Former  ?  Types: Cigarettes  ?  Quit date:

## 2021-10-28 ENCOUNTER — Other Ambulatory Visit: Payer: Self-pay | Admitting: Cardiovascular Disease

## 2021-10-29 ENCOUNTER — Other Ambulatory Visit (HOSPITAL_COMMUNITY): Payer: Self-pay | Admitting: Family Medicine

## 2021-10-30 ENCOUNTER — Other Ambulatory Visit: Payer: Self-pay | Admitting: Cardiovascular Disease

## 2021-11-01 ENCOUNTER — Ambulatory Visit (INDEPENDENT_AMBULATORY_CARE_PROVIDER_SITE_OTHER): Payer: Medicare Other | Admitting: Licensed Clinical Social Worker

## 2021-11-01 ENCOUNTER — Telehealth: Payer: Self-pay

## 2021-11-01 DIAGNOSIS — F4312 Post-traumatic stress disorder, chronic: Secondary | ICD-10-CM

## 2021-11-01 DIAGNOSIS — K7469 Other cirrhosis of liver: Secondary | ICD-10-CM

## 2021-11-01 DIAGNOSIS — E1122 Type 2 diabetes mellitus with diabetic chronic kidney disease: Secondary | ICD-10-CM

## 2021-11-01 DIAGNOSIS — F3132 Bipolar disorder, current episode depressed, moderate: Secondary | ICD-10-CM

## 2021-11-01 DIAGNOSIS — F410 Panic disorder [episodic paroxysmal anxiety] without agoraphobia: Secondary | ICD-10-CM

## 2021-11-01 NOTE — Patient Instructions (Signed)
Visit Information ? ?Thank you for taking time to visit with me today. Please don't hesitate to contact me if I can be of assistance to you before our next scheduled telephone appointment. ? ?Following are the goals we discussed today:  ?Patient Goals/Self-Care Activities: Over the next 120 days ?Call [employer] [phone]  ?Continue with compliance of taking medication  ?Collaborate with the community resource care guide ?Increase coping skills, healthy habits, and self-management skills ?Follow up with [facility] for grief support ? ?Our next appointment is by telephone on 01/24/22 at 10:45 AM ? ?Please call the care guide team at [phone] if you need to cancel or reschedule your appointment.  ? ?If you are experiencing a Mental Health or [place] or need someone to talk to, please call the Suicide and Crisis Lifeline: 988 ?call 911  ? ?Patient verbalizes understanding of instructions and care plan provided today and agrees to view in [facility]. Active MyChart status confirmed with patient.   ? ?Christa See, MSW, LCSW ?[facility] ?[facility] ?[email or web address] ?Phone [phone] ?10:35 AM ?  ?

## 2021-11-01 NOTE — Chronic Care Management (AMB) (Signed)
?Chronic Care Management  ? ? Clinical Social Work Note ? ?11/01/2021 ?Name: Bonnie Ochoa MRN: [id number] DOB: 1966-06-30 ? ?Bonnie Ochoa is a 56 y.o. year old female who is a primary care patient of Valerie Roys, DO. The CCM team was consulted to assist the patient with chronic disease management and/or care coordination needs related to: Mental Health Counseling and Resources.  ? ?Engaged with patient by telephone for follow up visit in response to provider referral for social work chronic care management and care coordination services.  ? ?Consent to Services:  ?The patient was given information about Chronic Care Management services, agreed to services, and gave verbal consent prior to initiation of services.  Please see initial visit note for detailed documentation.  ? ?Patient agreed to services and consent obtained.  ? ?Assessment: Review of patient past medical history, allergies, medications, and health status, including review of relevant consultants reports was performed today as part of a comprehensive evaluation and provision of chronic care management and care coordination services.    ? ?SDOH (Social Determinants of Health) assessments and interventions performed:   ? ?Advanced Directives Status: Not addressed in this encounter. ? ?CCM Care Plan ? ?Allergies  ?Allergen Reactions  ? Levothyroxine Rash  ? ? ?Outpatient Encounter Medications as of 11/01/2021  ?Medication Sig  ? blood glucose meter kit and supplies KIT Dispense based on patient and insurance preference. Use up to four times daily as directed. (FOR ICD-9 250.00, 250.01).  ? carvedilol (COREG) 12.5 MG tablet TAKE 1 TABLET (12.5MG TOTAL) BY MOUTH TWICE A DAY WITH MEALS  ? cyclobenzaprine (FLEXERIL) 5 MG tablet TAKE 1 TABLET BY MOUTH THREE TIMES A DAY AS NEEDED FOR MUSCLE SPASMS  ? digoxin (LANOXIN) 0.125 MG tablet TAKE 1 TABLET BY MOUTH DAILY  ? empagliflozin (JARDIANCE) 25 MG TABS tablet Take 1 tablet (25 mg total) by mouth daily.  (Patient not taking: Reported on 10/24/2021)  ? famotidine (PEPCID) 40 MG tablet Take 1 tablet (40 mg total) by mouth daily.  ? furosemide (LASIX) 20 MG tablet TAKE 1 TABLET (20 MG TOTAL) BY MOUTH AS DIRECTED. TAKE 20MG (1 TABLET) MONDAY, WEDNESDAY, FRIDAY.  ? insulin glargine (LANTUS SOLOSTAR) 100 UNIT/ML Solostar Pen Inject 45 Units into the skin daily at 10 pm.  ? Insulin Pen Needle 32G X 6 MM MISC 1 each by Does not apply route daily.  ? lamoTRIgine (LAMICTAL) 25 MG tablet Take 1 tablet (25 mg total) by mouth daily for 15 days, THEN 1 tablet (25 mg total) 2 (two) times daily for 15 days.  ? [START ON 11/23/2021] lamoTRIgine (LAMICTAL) 25 MG tablet Take 1 tablet (25 mg total) by mouth 2 (two) times daily.  ? metoCLOPramide (REGLAN) 10 MG tablet Take 10 mg by mouth 4 (four) times daily.  ? mirtazapine (REMERON) 7.5 MG tablet Take 1 tablet (7.5 mg total) by mouth at bedtime.  ? pantoprazole (PROTONIX) 40 MG tablet Take 1 tablet (40 mg total) by mouth daily.  ? potassium chloride SA (KLOR-CON M20) 20 MEQ tablet TAKE 2 TABLETS BY MOUTH TWICE A DAY  ? rosuvastatin (CRESTOR) 40 MG tablet Take 1 tablet (40 mg total) by mouth daily.  ? sacubitril-valsartan (ENTRESTO) 97-103 MG Take 1 tablet by mouth 2 (two) times daily. NEEDS FOLLOW UP APPOINTMENT FOR ANYMORE REFILLS  ? Semaglutide,0.25 or 0.5MG/DOS, (OZEMPIC, 0.25 OR 0.5 MG/DOSE,) 2 MG/1.5ML SOPN Inject into the skin.  ? spironolactone (ALDACTONE) 25 MG tablet TAKE 1 TABLET (25 MG TOTAL) BY MOUTH DAILY.  ?  sucralfate (CARAFATE) 1 g tablet TAKE 1 TABLET (1 G TOTAL) BY MOUTH 4 (FOUR) TIMES DAILY. (Patient not taking: Reported on 10/24/2021)  ? ?No facility-administered encounter medications on file as of 11/01/2021.  ? ? ?Patient Active Problem List  ? Diagnosis Date Noted  ? High risk medication use 10/24/2021  ? Bipolar 1 disorder, depressed, mild ([place]) 04/14/2020  ? Insomnia due to mental condition 02/16/2019  ? Bipolar disorder, current episode depressed, moderate ([place])  02/16/2019  ? Chronic post-traumatic stress disorder (PTSD) 02/16/2019  ? Panic disorder 02/16/2019  ? Cannabis use disorder, mild, abuse 02/16/2019  ? Unstable angina ([place]) 06/07/2018  ? Hypothyroid 04/30/2017  ? Multiple thyroid nodules 01/29/2017  ? Chronic systolic heart failure ([place]) 10/08/2016  ? Obstructive sleep apnea 10/08/2016  ? Cirrhosis ([place]) 09/18/2016  ? Pulmonary hypertension ([place]) 05/30/2016  ? Hypokalemia 05/30/2016  ? NICM (nonischemic cardiomyopathy) ([place]) 05/30/2016  ? Benign hypertensive renal disease   ? DM (diabetes mellitus), type 2 with renal complications ([place])   ? Asthma   ? ? ?Conditions to be addressed/monitored: HTN, DMII, Bipolar Disorder, and PTSD ; Limited access to food ? ?Care Plan : LCSW Plan of Care  ?Updates made by Rebekah [place], LCSW since 11/01/2021 12:00 AM  ?  ? ?Problem: Symptoms of Depression and Anxiety   ?  ? ?Long-Range Goal: Coping Skills Enhanced   ?Start Date: 06/01/2021  ?This Visit's Progress: On track  ?Recent Progress: On track  ?Priority: High  ?Note:   ?Current barriers:   ?Severe Persistent Mental Health needs related to Bipolar Disorder, PTSD, Panic Disorder ?Transportation, Limited access to food, Mental Health Concerns , and Substance abuse issues -  [place] ?Needs Support, Education, and Care Coordination in order to meet unmet mental health needs. ?Clinical Goal(s): demonstrate a reduction in symptoms related to :Anxiety, Depression, and Bipolar Disorder  ?verbalize understanding of plan for management of DMII  ?explore community resource options for unmet needs related to:  Transportation, [employer] , and Alcohol/Substance Use  ?Clinical Interventions:  ?Assessed patient's previous and current treatment, coping skills, support system and barriers to care  ?Patient picked up medication and started 45 units. Patient shared things are going well with no side effects. Patient checks BS 2-3 x weekly 11/7: Patient continues to comply with medication  management noting she may check BS once a week 3/22: Patient has filled new med, Ozempic, denying any adverse side effects. Sister assists her with managing meds ?Patient participates in medication management through [facility]. She has follow up appts every 3 months 3/22: Patient continues to participate in services, both therapy and med management. Reports management of mental health symptoms and decrease in marijuana use ?Patient endorses difficulty with stable transportation. CCM LCSW discussed [facility] and patient provided verbal consent for to complete a referral 11/7: Pt was informed that Care Guide has made an attempt to contact her. LCSW provided pt with Care Guide's callback number to initiate resources 12/29: Resources were provided  ?3/22: Patient reports food insecurity, which negatively impacts her ability to manage medical conditions (DMII) She shared that her SNAP benefits for discontinued after patient's social security increased slightly. CCM LCSW completed referral to care guide for assistance ?CCM LCSW discussed strategies to assist with stress management and self-care. Patient was successful at identifying healthy coping skills   ?Patient reports that depression and anxiety are triggered by grief of mother and often symptoms ?go up and down? They include fluctuating appetite, difficulty sleeping, and overwhelming  feelings of sadness. She denies SI/HI CCM LCSW provided validation and encouragement. CCM LCSW strongly encouraged patient to identify warning signs to when symptoms are unmanaged, in addition, to strategies to positively impact mood. Patient receives strong support from wife.  ?CCM LCSW discussed grief support resources. Patient provided consent for CCM LCSW to send information on [employer]  ?Patient reports difficulty managing joint pain in fingers, elbows, knees, and ankles. She obtains relief from OTC medicines (Advil and Aleve) 11/7: Patient  endorses increase in back pain. Pt takes OTC medications to assist in alleviating pain 12/29: Patient reports continued management of back pain with OTC meds ?Depression screen reviewed  ?Mindfulness or Relaxation

## 2021-11-01 NOTE — Telephone Encounter (Signed)
? ?  Telephone encounter was:  Unsuccessful.  11/01/2021 ?Name: Bonnie Ochoa MRN: [id number] DOB: 04/23/1966 ? ?Unsuccessful outbound call made today to assist with:  Food Insecurity ? ?Outreach Attempt:  1st Attempt ? ?A HIPAA compliant voice message was left requesting a return call.  Instructed patient to call back at earliest convenience. ? ?. ? ? ? ?Lenard Forth ?Care Guide, Embedded Care Coordination ?[place], Care Management  ?[phone] ?[address] [place], [place], [place] [address] ?Phone: [phone] ?Email: [email or web address] ? ?  ?

## 2021-11-02 ENCOUNTER — Telehealth: Payer: Self-pay

## 2021-11-02 ENCOUNTER — Telehealth: Payer: Medicare Other

## 2021-11-02 NOTE — Telephone Encounter (Signed)
? ?  Telephone encounter was:  Unsuccessful.  11/02/2021 ?Name: Christabella Alvira MRN: [id number] DOB: March 29, 1966 ? ?Unsuccessful outbound call made today to assist with:  Food Insecurity ? ?Outreach Attempt:  2nd Attempt ? ?A HIPAA compliant voice message was left requesting a return call.  Instructed patient to call back at earliest convenience. ? ?. ? ? ? ?Lenard Forth ?Care Guide, Embedded Care Coordination ?[facility], Care Management  ?[phone] ?[address] [place], [place], [place] [address] ?Phone: [phone] ?Email: [email or web address] ? ?  ?

## 2021-11-06 ENCOUNTER — Telehealth: Payer: Self-pay

## 2021-11-06 NOTE — Telephone Encounter (Signed)
? ?  Telephone encounter was:  Unsuccessful.  11/06/2021 ?Name: [name] MRN: [id number] DOB: February 27, 1966 ? ?Unsuccessful outbound call made today to assist with:  Food Insecurity ? ?Outreach Attempt:  3rd Attempt.  Referral closed unable to contact patient. ? ?A HIPAA compliant voice message was left requesting a return call.  Instructed patient to call back at earliest convenience. ?. ? ? ? ?Larena Sox ?Care Guide, Embedded Care Coordination ?[facility], Care Management  ?[phone] ?[address], [place], [place] [place] [address] ?Phone: [phone] ?Email: [email or web address] ? ?  ?

## 2021-11-09 ENCOUNTER — Other Ambulatory Visit: Payer: Self-pay | Admitting: Family Medicine

## 2021-11-09 NOTE — Telephone Encounter (Signed)
Requested medications are due for refill today.  unsure ? ?Requested medications are on the active medications list.  yes ? ?Last refill. 10/24/2021 ? ?Future visit scheduled.   yes ? ?Notes to clinic.  Medication is listed as historical from historical provider. ? ? ? ?Requested Prescriptions  ?Pending Prescriptions Disp Refills  ? OZEMPIC, 0.25 OR 0.5 MG/DOSE, 2 MG/1.5ML SOPN [Pharmacy Med Name: OZEMPIC 0.25-0.5 MG/DOSE PEN]  1  ?  Sig: INJECT 0.5 MG INTO THE SKIN ONCE A WEEK.  ?  ? Endocrinology:  Diabetes - GLP-1 Receptor Agonists - semaglutide Failed - 11/09/2021  1:56 AM  ?  ?  Failed - HBA1C in normal range and within 180 days  ?  Hemoglobin A1C  ?Date Value Ref Range Status  ?02/19/2018 7.8  Final  ? ?HB A1C (BAYER DCA - WAIVED)  ?Date Value Ref Range Status  ?09/14/2021 12.9 (H) 4.8 - 5.6 % Final  ?  Comment:  ?           Prediabetes: 5.7 - 6.4 ?         Diabetes: >6.4 ?         Glycemic control for adults with diabetes: <7.0 ?  ?  ?  ?  ?  Failed - Cr in normal range and within 360 days  ?  Creatinine, Ser  ?Date Value Ref Range Status  ?09/14/2021 1.33 (H) 0.57 - 1.00 mg/dL Final  ? ?Creatinine, Urine  ?Date Value Ref Range Status  ?07/02/2017 128 mg/dL Final  ?  ?  ?  ?  Passed - Valid encounter within last 6 months  ?  Recent Outpatient Visits   ? ?      ? 1 month ago Benign hypertensive renal disease  ? [place], Megan P, DO  ? 5 months ago Type 2 diabetes mellitus with stage 3a chronic kidney disease, with long-term current use of insulin ([place])  ? [place], Megan P, DO  ? 7 months ago Type 2 diabetes mellitus with stage 3a chronic kidney disease, with long-term current use of insulin ([place])  ? [place] P, DO  ? 10 months ago Chest pain, unspecified type  ? [place], [place] P, DO  ? 1 year ago Type 2 diabetes mellitus with stage 3a chronic kidney disease, with long-term current use of insulin ([place])  ?  [place], [place] P, DO  ? ?  ?  ?Future Appointments   ? ?        ? In 4 days Valerie Roys, DO [facility]  ? In 2 months Gollan, Kathlene November, MD [facility]  ? ?  ? ?  ?  ?  ?  ?

## 2021-11-10 DIAGNOSIS — F3132 Bipolar disorder, current episode depressed, moderate: Secondary | ICD-10-CM | POA: Diagnosis not present

## 2021-11-10 DIAGNOSIS — N1831 Chronic kidney disease, stage 3a: Secondary | ICD-10-CM

## 2021-11-10 DIAGNOSIS — E1122 Type 2 diabetes mellitus with diabetic chronic kidney disease: Secondary | ICD-10-CM | POA: Diagnosis not present

## 2021-11-10 DIAGNOSIS — Z794 Long term (current) use of insulin: Secondary | ICD-10-CM

## 2021-11-10 NOTE — Telephone Encounter (Signed)
Can we see if she has enough to get to her appt? 

## 2021-11-13 ENCOUNTER — Encounter: Payer: Self-pay | Admitting: Family Medicine

## 2021-11-13 ENCOUNTER — Ambulatory Visit (INDEPENDENT_AMBULATORY_CARE_PROVIDER_SITE_OTHER): Payer: Medicare Other | Admitting: Family Medicine

## 2021-11-13 VITALS — BP 105/71 | HR 69 | Temp 98.1°F | Wt 184.8 lb

## 2021-11-13 DIAGNOSIS — Z79899 Other long term (current) drug therapy: Secondary | ICD-10-CM | POA: Diagnosis not present

## 2021-11-13 DIAGNOSIS — E039 Hypothyroidism, unspecified: Secondary | ICD-10-CM | POA: Diagnosis not present

## 2021-11-13 DIAGNOSIS — I200 Unstable angina: Secondary | ICD-10-CM

## 2021-11-13 DIAGNOSIS — E1122 Type 2 diabetes mellitus with diabetic chronic kidney disease: Secondary | ICD-10-CM | POA: Diagnosis not present

## 2021-11-13 DIAGNOSIS — I129 Hypertensive chronic kidney disease with stage 1 through stage 4 chronic kidney disease, or unspecified chronic kidney disease: Secondary | ICD-10-CM | POA: Diagnosis not present

## 2021-11-13 DIAGNOSIS — Z794 Long term (current) use of insulin: Secondary | ICD-10-CM | POA: Diagnosis not present

## 2021-11-13 DIAGNOSIS — I5022 Chronic systolic (congestive) heart failure: Secondary | ICD-10-CM

## 2021-11-13 DIAGNOSIS — N1831 Chronic kidney disease, stage 3a: Secondary | ICD-10-CM | POA: Diagnosis not present

## 2021-11-13 LAB — BAYER DCA HB A1C WAIVED: HB A1C (BAYER DCA - WAIVED): 11 % — ABNORMAL HIGH (ref 4.8–5.6)

## 2021-11-13 MED ORDER — SEMAGLUTIDE (1 MG/DOSE) 4 MG/3ML ~~LOC~~ SOPN: 1.0000 mg | PEN_INJECTOR | SUBCUTANEOUS | 1 refills | Status: AC

## 2021-11-13 NOTE — Assessment & Plan Note (Signed)
Under good control on current regimen. Continue current regimen. Continue to monitor. Call with any concerns. Refills given. Labs drawn today.   

## 2021-11-13 NOTE — Progress Notes (Signed)
? ?BP 105/71   Pulse 69   Temp 98.1 ?F (36.7 ?C)   Wt 184 lb 12.8 oz (83.8 kg)   SpO2 97%   BMI 30.75 kg/m?   ? ?Subjective:  ? ? Patient ID: Bonnie Ochoa, female    DOB: 06-Apr-1966, 56 y.o.   MRN: [id number] ? ?HPI: ?Bonnie Ochoa is a 56 y.o. female ? ?Chief Complaint  ?Patient presents with  ? Diabetes  ? ?DIABETES ?Hypoglycemic episodes:no ?Polydipsia/polyuria: no ?Visual disturbance: no ?Chest pain: no ?Paresthesias: no ?Glucose Monitoring: no ? Accucheck frequency: Not Checking ? Fasting glucose: ? Post prandial: ? Evening: ? Before meals: ?Taking Insulin?: no ? Long acting insulin: ? Short acting insulin: ?Blood Pressure Monitoring: not checking ?Retinal Examination: Up to Date ?Foot Exam: Up to Date ?Diabetic Education: Completed ?Pneumovax: Up to Date ?Influenza: Up to Date ?Aspirin: yes ? ?HYPERTENSION / HYPERLIPIDEMIA ?Satisfied with current treatment? yes ?Duration of hypertension: chronic ?BP monitoring frequency: not checking ?BP medication side effects: no ?Duration of hyperlipidemia: chronic ?Cholesterol medication side effects: no ?Cholesterol supplements: none ?Past cholesterol medications: crestor ?Medication compliance: good compliance ?Aspirin: yes ?Recent stressors: no ?Recurrent headaches: no ?Visual changes: no ?Palpitations: no ?Dyspnea: no ?Chest pain: no ?Lower extremity edema: no ?Dizzy/lightheaded: no ? ?HYPOTHYROIDISM ?Thyroid control status:controlled ?Satisfied with current treatment? yes ?Medication side effects: no ?Medication compliance: excellent compliance ?Recent dose adjustment:no ?Fatigue: no ?Cold intolerance: no ?Heat intolerance: no ?Weight gain: no ?Weight loss: no ?Constipation: no ?Diarrhea/loose stools: no ?Palpitations: no ?Lower extremity edema: no ?Anxiety/depressed mood: no ? ? ?Relevant past medical, surgical, family and social history reviewed and updated as indicated. Interim medical history since our last visit reviewed. ?Allergies and medications reviewed  and updated. ? ?Review of Systems  ?Constitutional: Negative.   ?Respiratory: Negative.    ?Cardiovascular: Negative.   ?Gastrointestinal: Negative.   ?Musculoskeletal: Negative.   ?Neurological: Negative.   ?Psychiatric/Behavioral: Negative.    ? ?Per HPI unless specifically indicated above ? ?   ?Objective:  ?  ?BP 105/71   Pulse 69   Temp 98.1 ?F (36.7 ?C)   Wt 184 lb 12.8 oz (83.8 kg)   SpO2 97%   BMI 30.75 kg/m?   ?Wt Readings from Last 3 Encounters:  ?11/13/21 184 lb 12.8 oz (83.8 kg)  ?10/17/21 185 lb (83.9 kg)  ?09/14/21 186 lb 12.8 oz (84.7 kg)  ?  ?Physical Exam ?Vitals and nursing note reviewed.  ?Constitutional:   ?   General: She is not in acute distress. ?   Appearance: Normal appearance. She is not ill-appearing, toxic-appearing or diaphoretic.  ?HENT:  ?   Head: Normocephalic and atraumatic.  ?   Right Ear: External ear normal.  ?   Left Ear: External ear normal.  ?   Nose: Nose normal.  ?   Mouth/Throat:  ?   Mouth: Mucous membranes are moist.  ?   Pharynx: Oropharynx is clear.  ?Eyes:  ?   General: No scleral icterus.    ?   Right eye: No discharge.     ?   Left eye: No discharge.  ?   Extraocular Movements: Extraocular movements intact.  ?   Conjunctiva/sclera: Conjunctivae normal.  ?   Pupils: Pupils are equal, round, and reactive to light.  ?Cardiovascular:  ?   Rate and Rhythm: Normal rate and regular rhythm.  ?   Pulses: Normal pulses.  ?   Heart sounds: Normal heart sounds. No murmur heard. ?  No friction rub. No gallop.  ?Pulmonary:  ?  Effort: Pulmonary effort is normal. No respiratory distress.  ?   Breath sounds: Normal breath sounds. No stridor. No wheezing, rhonchi or rales.  ?Chest:  ?   Chest wall: No tenderness.  ?Musculoskeletal:     ?   General: Normal range of motion.  ?   Cervical back: Normal range of motion and neck supple.  ?Skin: ?   General: Skin is warm and dry.  ?   Capillary Refill: Capillary refill takes less than 2 seconds.  ?   Coloration: Skin is not jaundiced  or pale.  ?   Findings: No bruising, erythema, lesion or rash.  ?Neurological:  ?   General: No focal deficit present.  ?   Mental Status: She is alert and oriented to person, place, and time. Mental status is at baseline.  ?Psychiatric:     ?   Mood and Affect: Mood normal.     ?   Behavior: Behavior normal.     ?   Thought Content: Thought content normal.     ?   Judgment: Judgment normal.  ? ? ?Results for orders placed or performed in visit on 11/13/21  ?Bayer DCA Hb A1c Waived  ?Result Value Ref Range  ? HB A1C (BAYER DCA - WAIVED) 11.0 (H) 4.8 - 5.6 %  ? ?   ?Assessment & Plan:  ? ?Problem List Items Addressed This Visit   ? ?  ? Cardiovascular and Mediastinum  ? Chronic systolic heart failure (HCC) (Chronic)  ?  Euvolemic today. Continue to monitor. Call with any concerns. Refills and labs done today. ?  ?  ? Relevant Orders  ? Comprehensive metabolic panel  ? CBC with Differential/Platelet  ?  ? Endocrine  ? DM (diabetes mellitus), type 2 with renal complications (HCC)  ?  Still not doing great with A1c of 11.0, but down from 12.9. Will increase her ozempic to 1mg  weekly and recheck 3 months- will likely need to go to 2mg . Call with any concerns. Continue to monitor.  ?  ?  ? Relevant Medications  ? Semaglutide, 1 MG/DOSE, 4 MG/3ML SOPN  ? Other Relevant Orders  ? Comprehensive metabolic panel  ? Bayer DCA Hb A1c Waived (Completed)  ? CBC with Differential/Platelet  ? Lipid Panel w/o Chol/HDL Ratio  ? Hypothyroid  ?  Rechecking labs today. Await results. Treat as needed.  ?  ?  ? Relevant Orders  ? Comprehensive metabolic panel  ? CBC with Differential/Platelet  ? TSH  ?  ? Genitourinary  ? Benign hypertensive renal disease - Primary  ?  Under good control on current regimen. Continue current regimen. Continue to monitor. Call with any concerns. Refills given. Labs drawn today. ? ?  ?  ? Relevant Orders  ? Comprehensive metabolic panel  ? CBC with Differential/Platelet  ?  ? Other  ? High risk medication use   ?  Labs drawn today. Await results.  ?  ?  ? Relevant Orders  ? Comprehensive metabolic panel  ? CBC with Differential/Platelet  ? Lamotrigine level  ? Digoxin level  ?  ? ?Follow up plan: ?Return in about 3 months (around 02/12/2022). ? ? ? ? ? ?

## 2021-11-13 NOTE — Patient Instructions (Signed)
If you have a bunch of the 0.5mg  ozempic at home, do 2 shots a week until you use it up (OK to do 2shots on the same day) ?Then, start taking the 1mg  shot a week (at the pharmacy now) ?

## 2021-11-13 NOTE — Assessment & Plan Note (Signed)
Labs drawn today. Await results.  

## 2021-11-13 NOTE — Assessment & Plan Note (Signed)
Euvolemic today. Continue to monitor. Call with any concerns. Refills and labs done today. ?

## 2021-11-13 NOTE — Assessment & Plan Note (Signed)
Rechecking labs today. Await results. Treat as needed.  °

## 2021-11-13 NOTE — Assessment & Plan Note (Signed)
Still not doing great with A1c of 11.0, but down from 12.9. Will increase her ozempic to 1mg  weekly and recheck 3 months- will likely need to go to 2mg . Call with any concerns. Continue to monitor.  ?

## 2021-11-15 ENCOUNTER — Other Ambulatory Visit: Payer: Self-pay | Admitting: Psychiatry

## 2021-11-15 DIAGNOSIS — F3131 Bipolar disorder, current episode depressed, mild: Secondary | ICD-10-CM

## 2021-11-15 DIAGNOSIS — F410 Panic disorder [episodic paroxysmal anxiety] without agoraphobia: Secondary | ICD-10-CM

## 2021-11-15 DIAGNOSIS — F4312 Post-traumatic stress disorder, chronic: Secondary | ICD-10-CM

## 2021-11-15 DIAGNOSIS — F5105 Insomnia due to other mental disorder: Secondary | ICD-10-CM

## 2021-11-15 LAB — CBC WITH DIFFERENTIAL/PLATELET
Basophils Absolute: 0.1 10*3/uL (ref 0.0–0.2)
Basos: 1 %
EOS (ABSOLUTE): 0.1 10*3/uL (ref 0.0–0.4)
Eos: 1 %
Hematocrit: 37.3 % (ref 34.0–46.6)
Hemoglobin: 12.4 g/dL (ref 11.1–15.9)
Immature Grans (Abs): 0 10*3/uL (ref 0.0–0.1)
Immature Granulocytes: 0 %
Lymphocytes Absolute: 2.2 10*3/uL (ref 0.7–3.1)
Lymphs: 39 %
MCH: 30 pg (ref 26.6–33.0)
MCHC: 33.2 g/dL (ref 31.5–35.7)
MCV: 90 fL (ref 79–97)
Monocytes Absolute: 0.4 10*3/uL (ref 0.1–0.9)
Monocytes: 7 %
Neutrophils Absolute: 3 10*3/uL (ref 1.4–7.0)
Neutrophils: 52 %
Platelets: 214 10*3/uL (ref 150–450)
RBC: 4.14 x10E6/uL (ref 3.77–5.28)
RDW: 12.4 % (ref 11.7–15.4)
WBC: 5.7 10*3/uL (ref 3.4–10.8)

## 2021-11-15 LAB — DIGOXIN LEVEL: Digoxin, Serum: 1.3 ng/mL — ABNORMAL HIGH (ref 0.5–0.9)

## 2021-11-15 LAB — COMPREHENSIVE METABOLIC PANEL
ALT: 18 IU/L (ref 0–32)
AST: 7 IU/L (ref 0–40)
Albumin/Globulin Ratio: 1.5 (ref 1.2–2.2)
Albumin: 4.3 g/dL (ref 3.8–4.9)
Alkaline Phosphatase: 99 IU/L (ref 44–121)
BUN/Creatinine Ratio: 12 (ref 9–23)
BUN: 17 mg/dL (ref 6–24)
Bilirubin Total: 0.5 mg/dL (ref 0.0–1.2)
CO2: 24 mmol/L (ref 20–29)
Calcium: 9.4 mg/dL (ref 8.7–10.2)
Chloride: 93 mmol/L — ABNORMAL LOW (ref 96–106)
Creatinine, Ser: 1.44 mg/dL — ABNORMAL HIGH (ref 0.57–1.00)
Globulin, Total: 2.9 g/dL (ref 1.5–4.5)
Glucose: 539 mg/dL (ref 70–99)
Potassium: 4.5 mmol/L (ref 3.5–5.2)
Sodium: 128 mmol/L — ABNORMAL LOW (ref 134–144)
Total Protein: 7.2 g/dL (ref 6.0–8.5)
eGFR: 42 mL/min/{1.73_m2} — ABNORMAL LOW (ref >59)

## 2021-11-15 LAB — LIPID PANEL W/O CHOL/HDL RATIO
Cholesterol, Total: 132 mg/dL (ref 100–199)
HDL: 28 mg/dL — ABNORMAL LOW (ref >39)
LDL Chol Calc (NIH): 49 mg/dL (ref 0–99)
Triglycerides: 361 mg/dL — ABNORMAL HIGH (ref 0–149)
VLDL Cholesterol Cal: 55 mg/dL — ABNORMAL HIGH (ref 5–40)

## 2021-11-15 LAB — LAMOTRIGINE LEVEL: Lamotrigine Lvl: 1.2 ug/mL — ABNORMAL LOW (ref 2.0–20.0)

## 2021-11-15 LAB — TSH: TSH: 1.44 u[IU]/mL (ref 0.450–4.500)

## 2021-11-25 ENCOUNTER — Other Ambulatory Visit: Payer: Self-pay | Admitting: Cardiovascular Disease

## 2021-11-29 ENCOUNTER — Other Ambulatory Visit (HOSPITAL_COMMUNITY): Payer: Self-pay | Admitting: Family Medicine

## 2021-11-29 ENCOUNTER — Other Ambulatory Visit: Payer: Self-pay | Admitting: Cardiovascular Disease

## 2021-12-05 ENCOUNTER — Encounter: Payer: Self-pay | Admitting: Psychiatry

## 2021-12-05 ENCOUNTER — Telehealth (INDEPENDENT_AMBULATORY_CARE_PROVIDER_SITE_OTHER): Payer: Medicare Other | Admitting: Psychiatry

## 2021-12-05 DIAGNOSIS — I200 Unstable angina: Secondary | ICD-10-CM | POA: Diagnosis not present

## 2021-12-05 DIAGNOSIS — F410 Panic disorder [episodic paroxysmal anxiety] without agoraphobia: Secondary | ICD-10-CM | POA: Diagnosis not present

## 2021-12-05 DIAGNOSIS — F5105 Insomnia due to other mental disorder: Secondary | ICD-10-CM

## 2021-12-05 DIAGNOSIS — Z79899 Other long term (current) drug therapy: Secondary | ICD-10-CM

## 2021-12-05 DIAGNOSIS — F1210 Cannabis abuse, uncomplicated: Secondary | ICD-10-CM | POA: Diagnosis not present

## 2021-12-05 DIAGNOSIS — F3175 Bipolar disorder, in partial remission, most recent episode depressed: Secondary | ICD-10-CM

## 2021-12-05 DIAGNOSIS — F4312 Post-traumatic stress disorder, chronic: Secondary | ICD-10-CM | POA: Diagnosis not present

## 2021-12-05 MED ORDER — MIRTAZAPINE 7.5 MG PO TABS: 7.5000 mg | ORAL_TABLET | Freq: Every day | ORAL | 1 refills | Status: DC

## 2021-12-05 MED ORDER — LAMOTRIGINE 25 MG PO TABS: 25.0000 mg | ORAL_TABLET | Freq: Two times a day (BID) | ORAL | 1 refills | Status: DC

## 2021-12-05 NOTE — Patient Instructions (Signed)
Cannabis Use Disorder ?Cannabis use disorder occurs when marijuana use disrupts a person's daily life or causes health problems. This condition can be dangerous. The health problems this condition can cause include: ?Long-lasting problems with thinking and learning. These can be permanent in young people. ?Mental health problems, such as severe anxiety, paranoia, hallucinations, or schizophrenia. ?Dangerously high blood pressure and heart rate. ?Breathing problems. ?Problems with child development during and after pregnancy. ?People with this condition are also more likely to use other drugs. ?What are the causes? ?This condition is caused by using marijuana too much over time. It is not caused by using it only once in a while. Many people with this condition use marijuana because it gives them a feeling of extreme pleasure or relaxation. ?What increases the risk? ?This condition is more likely to develop in: ?Men. ?People with a family history of cannabis use disorder. ?People with mental health issues such as depression or post-traumatic stress disorder (PTSD). ?What are the signs or symptoms? ?Symptoms of this condition include: ?Addiction ?Using greater amounts of marijuana than you want to, or using marijuana for longer than you want to. ?Craving marijuana. ?Spending a lot of time getting marijuana, using it, or recovering from its effects. ?Having problems at work, at school, at home, or in relationships because of marijuana use. ?Giving up or cutting down on important life activities because of marijuana use. ?Using marijuana at times when it is dangerous, such as while you are driving a car. ?Needing more and more marijuana to get the same desired effect (building up a tolerance). ?Lack of motivation, known as amotivational syndrome, which leads to poor school and work performance. ?Physical problems ?A long-lasting cough. ?Bronchitis. ?Emphysema. ?Throat and lung cancer. ?Mental  problems ?Psychosis. ?Anxiety. ?Trouble sleeping. ?Increase in violent behavior in young people. ?Withdrawal problems ?You may have symptoms when you stop using marijuana. Symptoms include: ?Irritability or anger. ?Anxiety or restlessness. ?Trouble sleeping. ?Loss of appetite or weight loss. ?Aches and pains. ?Shakiness, sweating, or chills. ?How is this diagnosed? ?This condition is diagnosed with an assessment. During the assessment, your health care provider will ask about your marijuana use and how it affects your life. You will be diagnosed with the condition if you have had at least two symptoms of this condition within a 69-month period. How severe the condition is depends on how many symptoms you have. ?If you have two to three symptoms, your condition is mild. ?If you have four to five symptoms, your condition is moderate. ?If you have six or more symptoms, your condition is severe. ?Your health care provider may perform a physical exam or do lab tests to see if you have physical problems resulting from marijuana use. Your health care provider may also screen for drug use and refer you to a mental health professional for evaluation. ?How is this treated? ?Treatment for this condition is usually provided by mental health professionals with training in substance use disorders. Your treatment may involve: ?Counseling. This treatment is also called talk therapy. It is provided by substance use treatment counselors. A counselor can address the reasons you use marijuana and suggest ways to keep you from using it again. The goals of talk therapy are to: ?Find healthy activities to replace using marijuana. ?Identify and avoid the things that trigger your marijuana use. ?Help you learn how to handle cravings. ?Support groups. Support groups are led by people who have quit using marijuana. They provide emotional support, advice, and guidance. ?Medicine. Medicine is used  to treat mental health issues that trigger  marijuana use or that result from it. ?Follow these instructions at home: ?Lifestyle ?Make healthy lifestyle choices, such as: ?Eating a healthy diet. ?Getting enough exercise. ?Improving your stress-management skills. ? ?General instructions ?Take over-the-counter, prescription medicines, and herbal remedies only as told by your health care provider. ?Check with your health care provider before starting any new medicines. ?Work with Higher education careers adviser or group to develop tools to keep you from using marijuana again (relapsing). ?Learn daily living skills and work skills. ?Keep all follow-up visits as told by your health care provider. This is important. ?Where to find more information ?[employer] on Drug Abuse: motorcyclefax.com ?Centers for Disease Control and Prevention: http://www.wolf.info/ ?Substance Abuse and Mental Health Services Administration: ktimeonline.com ?Contact a health care provider if: ?You are not able to take your medicines as told. ?Your symptoms get worse. ?Get help right away if: ?You have serious thoughts about hurting yourself or others. ?If you ever feel like you may hurt yourself or others, or have thoughts about taking your own life, get help right away. You can go to your nearest emergency department or call: ?Your local emergency services (911 in the [place] ?A suicide crisis helpline, such as the [place] at 701-556-7207 or 988 in the [place]. This is open 24 hours a day in the [place] ?Summary ?Cannabis use disorder is when using marijuana disrupts a person's daily life or causes health problems. ?This condition is caused by using marijuana too much over time. ?Treatment for this condition is usually provided by mental health professionals with training in substance use disorders. ?Treatment may involve counseling, support groups, or medicine. ?This information is not intended to replace advice given to you by your health care provider. Make sure you discuss any  questions you have with your health care provider. ?Document Revised: 06/23/2021 Document Reviewed: 06/30/2019 ?Elsevier Patient Education ? [place]. ? ?

## 2021-12-05 NOTE — Progress Notes (Signed)
Virtual Visit via Video Note ? ?I connected with Bonnie Ochoa on 12/05/21 at  1:20 PM EDT by a video enabled telemedicine application and verified that I am speaking with the correct person using two identifiers. ? ?Location ?Provider Location : [facility] ?Patient Location : Home ? ?Participants: Patient , Provider ?  ?I discussed the limitations of evaluation and management by telemedicine and the availability of in person appointments. The patient expressed understanding and agreed to proceed ? ?  ?I discussed the assessment and treatment plan with the patient. The patient was provided an opportunity to ask questions and all were answered. The patient agreed with the plan and demonstrated an understanding of the instructions. ?  ?The patient was advised to call back or seek an in-person evaluation if the symptoms worsen or if the condition fails to improve as anticipated. ? ? ? ?BH MD OP Progress Note ? ?12/05/2021 2:28 PM ?Bonnie Ochoa  ?MRN:  [id number] ? ?Chief Complaint:  ?Chief Complaint  ?Patient presents with  ? Follow-up: 56 year old African-American female with bipolar disorder, PTSD, panic disorder, multiple medical problems including diabetes mellitus, cirrhosis of liver, presented for medication management.  ? ?HPI: Bonnie Ochoa is a 56 year old African-American female, engaged, lives in [place], has a history of bipolar disorder, PTSD, cannabis use disorder, insomnia on CPAP for OSA, RLS, diabetes, cirrhosis of liver, chronic renal failure, congestive heart failure, hypertension was evaluated by telemedicine today. ? ?Patient today reports since being back on the Lamictal and starting the new medication mirtazapine her mood symptoms as well as sleep has improved.  She has not had any significant irritability or anger issues.  She has been sleeping better. ? ?Patient denies any sadness, crying spells. ? ?Patient denies any hallucinations.  Denies any suicidality or homicidality. ? ?Patient continues to  use cannabis, cannabinoid products on a regular basis.  Patient reports she tried quitting however is unable to do so.  She is interested in referral for counseling. ? ?Patient denies any other concerns today. ? ?Visit Diagnosis:  ?  ICD-10-CM   ?1. Bipolar disorder, in partial remission, most recent episode depressed (HCC)  F31.75 mirtazapine (REMERON) 7.5 MG tablet  ?  lamoTRIgine (LAMICTAL) 25 MG tablet  ?  ?2. Chronic post-traumatic stress disorder (PTSD)  F43.12 mirtazapine (REMERON) 7.5 MG tablet  ?  ?3. Panic disorder  F41.0 mirtazapine (REMERON) 7.5 MG tablet  ?  ?4. Cannabis use disorder, mild, abuse  F12.10   ?  ?5. Insomnia due to mental condition  F51.05 mirtazapine (REMERON) 7.5 MG tablet  ? mood  ?  ?6. High risk medication use  Z79.899   ?  ? ? ?Past Psychiatric History: Reviewed past psychiatric history from progress note on 10/16/2017.  Past trials of medications like risperidone, trazodone, Ambien, Lexapro, Lamictal, Seroquel. ? ?Past Medical History:  ?Past Medical History:  ?Diagnosis Date  ? Abdominal pain   ? a. 05/2018 HIDA scan wnl.  ? ADHD   ? AICD (automatic cardioverter/defibrillator) present   ? a. 11/2014 s/p SJM Fortify Assura, single lead AICD (ser# [id number]).  ? Anemia   ? Arthritis   ? Asthma   ? Bipolar 1 disorder (HCC)   ? Chest pain   ? a. Hx of cath in [place] - reportedly nl; b. 04/2018 MV: EF 22%, fixed dist ant septal, apical, and inferoapical defects - ? scar vs. attenuation. No ischemia.  ? CHF (congestive heart failure) (HCC)   ? Cirrhosis of liver (HCC)   ? Coronary artery  disease   ? Depression   ? Diabetes mellitus without complication (HCC)   ? Diverticulitis   ? Dysrhythmia   ? Heart murmur   ? HFrEF (heart failure with reduced ejection fraction) (HCC)   ? a. 06/2017 Echo: EF 20-25%, diff HK. Mildly dil LA; b. 05/2018 Echo: EF 25-30%, diff HK, Gr2 DD. Triv AI. Mod MR. Mildly reduced RV fxn. Mod-Sev TR. PASP 45-4mmHg.  ? Hypertension   ? Hypothyroidism   ? IBS (irritable bowel  syndrome)   ? Insomnia   ? Migraines   ? NICM (nonischemic cardiomyopathy) (HCC)   ? a. EF prev 25%; b. 11/2014 s/p SJM Fortify Assura, single lead AICD (ser# [id number]); c. 06/2017 Echo: EF 20-25%; d. 05/2018 Echo: EF 25-30%, diff HK, Gr2 DD; e. 05/2018 Echo: EF 25-30%; f. 05/2018 Cath: Nl cors. LVEDP , PCWP . PA 65/40 (52). CO/CI 3.04/1.56; c. 07/2018 CPX: Mod HF limitation.  ? OSA (obstructive sleep apnea)   ? CPAP is broken  ? PTSD (post-traumatic stress disorder)   ? Restless leg syndrome   ? Vertigo   ?  ?Past Surgical History:  ?Procedure Laterality Date  ? ABDOMINAL HYSTERECTOMY    ? CARDIAC CATHETERIZATION    ? CATARACT EXTRACTION W/PHACO Right 01/27/2019  ? Procedure: CATARACT EXTRACTION PHACO AND INTRAOCULAR LENS PLACEMENT (IOC)  VISION BLUE RIGHT DIABETES;  Surgeon: Galen Manila, MD;  Location: [facility];  Service: Ophthalmology;  Laterality: Right;  Diabetes - insulin ?sleep apnea  ? CATARACT EXTRACTION W/PHACO Left 03/08/2020  ? Procedure: CATARACT EXTRACTION PHACO AND INTRAOCULAR LENS PLACEMENT (IOC) LEFT DIABETIC 10.28  00:57.8;  Surgeon: Galen Manila, MD;  Location[facility];  Service: Ophthalmology;  Laterality: Left;  ? COLONOSCOPY WITH PROPOFOL N/A 03/11/2019  ? Procedure: COLONOSCOPY WITH PROPOFOL;  Surgeon: Pasty Spillers, MD;  Location: [facility] ENDOSCOPY;  Service: Endoscopy;  Laterality: N/A;  ? CORONARY ARTERY BYPASS GRAFT    ? Pt denies  ? ESOPHAGOGASTRODUODENOSCOPY (EGD) WITH PROPOFOL N/A 03/11/2019  ? Procedure: ESOPHAGOGASTRODUODENOSCOPY (EGD) WITH PROPOFOL;  Surgeon: Pasty Spillers, MD;  Location: [facility] ENDOSCOPY;  Service: Endoscopy;  Laterality: N/A;  ? INSERT / REPLACE / REMOVE PACEMAKER    ? ICD  ? INSERTION OF ICD  2016  ? [employer] Single chamber ICD  ? RIGHT/LEFT HEART CATH AND CORONARY ANGIOGRAPHY N/A 06/09/2018  ? Procedure: RIGHT/LEFT HEART CATH AND CORONARY ANGIOGRAPHY;  Surgeon: Iran Ouch, MD;  Location: [facility] INVASIVE CV LAB;   Service: Cardiovascular;  Laterality: N/A;  ? TONSILLECTOMY    ? TUBAL LIGATION  1980  ? ? ?Family Psychiatric History: Reviewed family psychiatric history from progress note on 10/16/2017. ? ?Family History:  ?Family History  ?Problem Relation Age of Onset  ? Hypertension Mother   ? Hyperlipidemia Mother   ? Heart disease Mother   ? Hypertension Sister   ? Asthma Sister   ? Heart disease Sister   ? Diabetes Sister   ? Cancer Sister   ? Alzheimer's disease Maternal Grandfather   ? Hyperlipidemia Brother   ? Asthma Sister   ? Hypertension Sister   ? Diabetes Sister   ? Breast cancer Paternal Aunt   ? ? ?Social History: Reviewed social history from progress note on 10/16/2017. ?Social History  ? ?Socioeconomic History  ? Marital status: Married  ?  Spouse name: Not on file  ? Number of children: 2  ? Years of education: Not on file  ? Highest education level: High school graduate  ?Occupational  History  ?  Comment: disabled  ?Tobacco Use  ? Smoking status: Former  ?  Types: Cigarettes  ?  Quit date: 30  ?  Years since quitting: 38.3  ? Smokeless tobacco: Never  ? Tobacco comments:  ?  quit over  20 years ago   ?Vaping Use  ? Vaping Use: Never used  ?Substance and Sexual Activity  ? Alcohol use: Not Currently  ? Drug use: Yes  ?  Frequency: 1.0 times per week  ?  Types: Marijuana  ?  Comment: occassionally  ? Sexual activity: Yes  ?  Birth control/protection: Surgical  ?  Comment: one partner  ?Other Topics Concern  ? Not on file  ?Social History Narrative  ? Volunteers occasionally   ? ?Social Determinants of Health  ? ?Financial Resource Strain: Medium Risk  ? Difficulty of Paying Living Expenses: Somewhat hard  ?Food Insecurity: No Food Insecurity  ? Worried About Programme researcher, broadcasting/film/video in the Last Year: Never true  ? Ran Out of Food in the Last Year: Never true  ?Transportation Needs: No Transportation Needs  ? Lack of Transportation (Medical): No  ? Lack of Transportation (Non-Medical): No  ?Physical Activity:  Inactive  ? Days of Exercise per Week: 0 days  ? Minutes of Exercise per Session: 0 min  ?Stress: Stress Concern Present  ? Feeling of Stress : To some extent  ?Social Connections: Socially Isolated  ? Milana Obey

## 2021-12-17 ENCOUNTER — Other Ambulatory Visit: Payer: Self-pay | Admitting: Family Medicine

## 2021-12-18 ENCOUNTER — Other Ambulatory Visit: Payer: Self-pay | Admitting: Family Medicine

## 2021-12-18 ENCOUNTER — Other Ambulatory Visit: Payer: Self-pay | Admitting: Cardiovascular Disease

## 2021-12-18 ENCOUNTER — Other Ambulatory Visit: Payer: Self-pay | Admitting: Psychiatry

## 2021-12-18 DIAGNOSIS — F410 Panic disorder [episodic paroxysmal anxiety] without agoraphobia: Secondary | ICD-10-CM

## 2021-12-18 DIAGNOSIS — F4312 Post-traumatic stress disorder, chronic: Secondary | ICD-10-CM

## 2021-12-18 DIAGNOSIS — F3131 Bipolar disorder, current episode depressed, mild: Secondary | ICD-10-CM

## 2021-12-18 NOTE — Telephone Encounter (Signed)
Refilled 09/14/2021 #90 1 refill ?Requested Prescriptions  ?Pending Prescriptions Disp Refills  ?? pantoprazole (PROTONIX) 40 MG tablet [Pharmacy Med Name: PANTOPRAZOLE SOD DR 40 MG TAB] 90 tablet 1  ?  Sig: TAKE 1 TABLET BY MOUTH EVERY DAY  ?  ? Gastroenterology: Proton Pump Inhibitors Passed - 12/17/2021  2:48 PM  ?  ?  Passed - Valid encounter within last 12 months  ?  Recent Outpatient Visits   ?      ? 1 month ago Benign hypertensive renal disease  ? [place] P, DO  ? 3 months ago Benign hypertensive renal disease  ? [employer], Megan P, DO  ? 7 months ago Type 2 diabetes mellitus with stage 3a chronic kidney disease, with long-term current use of insulin ([place])  ? [place], Megan P, DO  ? 8 months ago Type 2 diabetes mellitus with stage 3a chronic kidney disease, with long-term current use of insulin ([place])  ? [place] P, DO  ? 11 months ago Chest pain, unspecified type  ? [place], [place] P, DO  ?  ?  ?Future Appointments   ?        ? In 1 month Gollan, Kathlene November, MD [facility]  ? In 1 month Johnson, Barb Merino, DO [facility], [facility]  ?  ? ?  ?  ?  ? ?

## 2021-12-19 NOTE — Telephone Encounter (Signed)
6 months of her ozempic sent 3 months ago. Should not be due.  ?

## 2021-12-19 NOTE — Telephone Encounter (Signed)
Requested medications are due for refill today.  Provider to determine ? ?Requested medications are on the active medications list.  yes ? ?Last refill. Flexeril 03/14/2021 #60 1 refill. Ozempic 09/14/2021  ? ?Future visit scheduled.   yes ? ?Notes to clinic.  Flexeril is not delegated. Ozempic rx expired 10/14/2021 - also sig is incorrect. Dose was increased to 1mg  weekly at OV of 11/13/2021. ? ? ?Requested Prescriptions  ?Pending Prescriptions Disp Refills  ? OZEMPIC, 0.25 OR 0.5 MG/DOSE, 2 MG/1.5ML SOPN [Pharmacy Med Name: OZEMPIC 0.25-0.5 MG/DOSE PEN]  1  ?  Sig: Inject 0.5 mg into the skin once a week.  ?  ? Endocrinology:  Diabetes - GLP-1 Receptor Agonists - semaglutide Failed - 12/18/2021  1:35 AM  ?  ?  Failed - HBA1C in normal range and within 180 days  ?  Hemoglobin A1C  ?Date Value Ref Range Status  ?02/19/2018 7.8  Final  ? ?HB A1C (BAYER DCA - WAIVED)  ?Date Value Ref Range Status  ?11/13/2021 11.0 (H) 4.8 - 5.6 % Final  ?  Comment:  ?           Prediabetes: 5.7 - 6.4 ?         Diabetes: >6.4 ?         Glycemic control for adults with diabetes: <7.0 ?  ?  ?  ?  ?  Failed - Cr in normal range and within 360 days  ?  Creatinine, Ser  ?Date Value Ref Range Status  ?11/13/2021 1.44 (H) 0.57 - 1.00 mg/dL Final  ? ?Creatinine, Urine  ?Date Value Ref Range Status  ?07/02/2017 128 mg/dL Final  ?  ?  ?  ?  Passed - Valid encounter within last 6 months  ?  Recent Outpatient Visits   ? ?      ? 1 month ago Benign hypertensive renal disease  ? [place] P, DO  ? 3 months ago Benign hypertensive renal disease  ? [place], Megan P, DO  ? 7 months ago Type 2 diabetes mellitus with stage 3a chronic kidney disease, with long-term current use of insulin ([place])  ? [place], Megan P, DO  ? 8 months ago Type 2 diabetes mellitus with stage 3a chronic kidney disease, with long-term current use of insulin ([place])  ? [place] P, DO   ? 11 months ago Chest pain, unspecified type  ? [place], [place] P, DO  ? ?  ?  ?Future Appointments   ? ?        ? In 4 weeks Gollan, Kathlene November, MD [facility]  ? In 1 month Johnson, Barb Merino, DO [facility]  ? ?  ? ? ?  ?  ?  ? cyclobenzaprine (FLEXERIL) 5 MG tablet [Pharmacy Med Name: CYCLOBENZAPRINE 5 MG TABLET] 60 tablet 1  ?  Sig: TAKE 1 TABLET BY MOUTH THREE TIMES A DAY AS NEEDED FOR MUSCLE SPASMS  ?  ? Not Delegated - Analgesics:  Muscle Relaxants Failed - 12/18/2021  1:35 AM  ?  ?  Failed - This refill cannot be delegated  ?  ?  Passed - Valid encounter within last 6 months  ?  Recent Outpatient Visits   ? ?      ? 1 month ago Benign hypertensive renal disease  ? [place], Megan P, DO  ? 3 months  ago Benign hypertensive renal disease  ? [place], Megan P, DO  ? 7 months ago Type 2 diabetes mellitus with stage 3a chronic kidney disease, with long-term current use of insulin ([place])  ? [place], Megan P, DO  ? 8 months ago Type 2 diabetes mellitus with stage 3a chronic kidney disease, with long-term current use of insulin ([place])  ? [place] P, DO  ? 11 months ago Chest pain, unspecified type  ? [place], [place] P, DO  ? ?  ?  ?Future Appointments   ? ?        ? In 4 weeks Gollan, Kathlene November, MD [facility], [facility]  ? In 1 month Johnson, Barb Merino, DO [facility], [facility]  ? ?  ? ? ?  ?  ?  ?  ?

## 2021-12-21 ENCOUNTER — Other Ambulatory Visit: Payer: Self-pay | Admitting: Cardiovascular Disease

## 2021-12-22 ENCOUNTER — Other Ambulatory Visit: Payer: Self-pay | Admitting: Cardiovascular Disease

## 2021-12-31 ENCOUNTER — Other Ambulatory Visit: Payer: Self-pay | Admitting: Psychiatry

## 2021-12-31 DIAGNOSIS — F3131 Bipolar disorder, current episode depressed, mild: Secondary | ICD-10-CM

## 2022-01-16 NOTE — Progress Notes (Unsigned)
Cardiology Office Note  Date:  01/17/2022   ID:  Bonnie Ochoa, DOB 1965-09-24, MRN [id number]  PCP:  Valerie Roys, DO   Chief Complaint  Patient presents with   3 month follow up     Patient c/o left arm pain for the past 3 months. Medications reviewed by the patient verbally.     HPI:  [name] is a 56 yo woman with PMH Medical and appt noncompliance, Substance abuse-chronic /marijuana bipolar,  anxiety diabetes, hypertension,  asthma,  Obstructive sleep apnea, restless leg syndrome Nonischemic cardiomyopathy,  history of ICD,  Cath 2019: no CAD ejection fraction 25%, 05/2018 EF  25 to 30% on 12/21 anemia Previous alcohol problem  ascites, cirrhosis, History of paracentesis Previously on pain medication at home, oxycodone She presents today for follow-up of her chronic systolic CHF  Last seen by myself in clinic March 2023 Echocardiogram March 2023 Ejection fraction 30 to 35% moderately dilated Prior EF 2021 was 25 to 30%  Weight down 11 pounds with ozempic She has appreciated [place] syptoms  Daughter who does the medications in a pillbox is not with her today Medications listed on her current active medication list - Continue spironolactone to 25 mg daily.  - Continue carvedilol 12.5 mg twice a day. - Continue digoxin 0.125 mg daily.  - Continue Jardiance 25 daily (she is not sure if she is taking) - Change Lasix 20 mg to M/W/F, OK to take extra if needed - Continue Entresto 97/103 bid mg  Reports feeling well overall A1c greater than 12, this was prior to weight loss  Labs reviewed HGBA1C 12.9 Total chol 303 to 172 LDL 84  ECHO 07/2020 Ef 25 to 30-%  EKG personally reviewed by myself on todays visit Normal sinus rhythm rate 64 bpm nonspecific ST abnormality  Other past medical history reviewed hospitalization October 25, 2018 for renal failure CR 2.15 Felt to be secondary to dehydration and improved with IV fluids She was continued on  Lasix at discharge   Other past medical history reviewed Previous hospitalization November 2018 for hyperglycemia nonketotic coma She had acute renal failure and hyponatremia  Hospital admission 09/10/16 numerous sx on arrival to ER chest pain, shortness of breath, abdominal pain, dysuria, urinary frequency, syncope 2 today with lightheadedness, nausea and vomiting, and diarrhea , sob TBili 2.4, had paracentesis, diuresis D/c on lasix 60 BID   Hospital admission 09/17/16: chest pain, ABD pain fevers, chills, chest pain, shortness of breath, vomiting and diarrhea. "ran out of meds" HTN, acute on chronic systolic CHF   Seen in the emergency room 05/19/2016 for abdominal pain, chest pain BNP in the hospital 3700  acute on chronic CHF, Had 20 L diuresis   CT scan consistent with moderate abdominal ascites, fatty liver Aortic atherosclerosis   PMH:   has a past medical history of Abdominal pain, ADHD, AICD (automatic cardioverter/defibrillator) present, Anemia, Arthritis, Asthma, Bipolar 1 disorder ([place]), Chest pain, CHF (congestive heart failure) ([place]), Cirrhosis of liver ([place]), Coronary artery disease, Depression, Diabetes mellitus without complication ([place]), Diverticulitis, Dysrhythmia, Heart murmur, HFrEF (heart failure with reduced ejection fraction) ([place]), Hypertension, Hypothyroidism, IBS (irritable bowel syndrome), Insomnia, Migraines, NICM (nonischemic cardiomyopathy) ([place]), OSA (obstructive sleep apnea), PTSD (post-traumatic stress disorder), Restless leg syndrome, and Vertigo.  PSH:    Past Surgical History:  Procedure Laterality Date   ABDOMINAL HYSTERECTOMY     CARDIAC CATHETERIZATION     CATARACT EXTRACTION W/PHACO Right 01/27/2019   Procedure: CATARACT EXTRACTION PHACO AND INTRAOCULAR LENS  PLACEMENT ([place])  VISION BLUE RIGHT DIABETES;  Surgeon: Birder Robson, MD;  Location: [facility];  Service: Ophthalmology;  Laterality: Right;  Diabetes - insulin sleep apnea    CATARACT EXTRACTION W/PHACO Left 03/08/2020   Procedure: CATARACT EXTRACTION PHACO AND INTRAOCULAR LENS PLACEMENT (IOC) LEFT DIABETIC 10.28  00:57.8;  Surgeon: Birder Robson, MD;  Location: [facility];  Service: Ophthalmology;  Laterality: Left;   COLONOSCOPY WITH PROPOFOL N/A 03/11/2019   Procedure: COLONOSCOPY WITH PROPOFOL;  Surgeon: Virgel Manifold, MD;  Location: [facility];  Service: Endoscopy;  Laterality: N/A;   CORONARY ARTERY BYPASS GRAFT     Pt denies   ESOPHAGOGASTRODUODENOSCOPY (EGD) WITH PROPOFOL N/A 03/11/2019   Procedure: ESOPHAGOGASTRODUODENOSCOPY (EGD) WITH PROPOFOL;  Surgeon: Virgel Manifold, MD;  Location: [facility];  Service: Endoscopy;  Laterality: N/A;   INSERT / REPLACE / REMOVE PACEMAKER     ICD   INSERTION OF ICD  2016   St Jude Single chamber ICD   RIGHT/LEFT HEART CATH AND CORONARY ANGIOGRAPHY N/A 06/09/2018   Procedure: RIGHT/LEFT HEART CATH AND CORONARY ANGIOGRAPHY;  Surgeon: Wellington Hampshire, MD;  Location: [facility];  Service: Cardiovascular;  Laterality: N/A;   TONSILLECTOMY     TUBAL LIGATION  1980    Current Outpatient Medications  Medication Sig Dispense Refill   blood glucose meter kit and supplies KIT Dispense based on patient and insurance preference. Use up to four times daily as directed. (FOR ICD-9 250.00, 250.01). 1 each 0   carvedilol (COREG) 12.5 MG tablet TAKE 1 TABLET (12.5MG TOTAL) BY MOUTH TWICE A DAY WITH MEALS 60 tablet 1   cyclobenzaprine (FLEXERIL) 5 MG tablet TAKE 1 TABLET BY MOUTH THREE TIMES A DAY AS NEEDED FOR MUSCLE SPASMS 60 tablet 1   digoxin (LANOXIN) 0.125 MG tablet TAKE 1 TABLET BY MOUTH EVERY DAY 90 tablet 0   famotidine (PEPCID) 40 MG tablet Take 1 tablet (40 mg total) by mouth daily. 90 tablet 1   furosemide (LASIX) 20 MG tablet TAKE 1 TABLET (20 MG TOTAL) BY MOUTH AS DIRECTED. TAKE 20MG (1 TABLET) MONDAY, WEDNESDAY, FRIDAY. 36 tablet 0   insulin glargine (LANTUS SOLOSTAR) 100 UNIT/ML  Solostar Pen Inject 45 Units into the skin daily at 10 pm. 45 mL 1   Insulin Pen Needle 32G X 6 MM MISC 1 each by Does not apply route daily. 100 each 12   lamoTRIgine (LAMICTAL) 25 MG tablet Take 1 tablet (25 mg total) by mouth daily for 15 days, THEN 1 tablet (25 mg total) 2 (two) times daily for 15 days. 45 tablet 0   lamoTRIgine (LAMICTAL) 25 MG tablet Take 1 tablet (25 mg total) by mouth 2 (two) times daily. 60 tablet 1   metoCLOPramide (REGLAN) 10 MG tablet Take 10 mg by mouth 4 (four) times daily.     mirtazapine (REMERON) 7.5 MG tablet Take 1 tablet (7.5 mg total) by mouth at bedtime. 30 tablet 1   pantoprazole (PROTONIX) 40 MG tablet Take 1 tablet (40 mg total) by mouth daily. 90 tablet 1   potassium chloride SA (KLOR-CON M20) 20 MEQ tablet TAKE 2 TABLETS BY MOUTH TWICE A DAY 120 tablet 1   rosuvastatin (CRESTOR) 40 MG tablet TAKE 1 TABLET BY MOUTH EVERY DAY 90 tablet 0   sacubitril-valsartan (ENTRESTO) 97-103 MG Take 1 tablet by mouth 2 (two) times daily. Absolute last refill without office visit please call [phone] to schedule 60 tablet 0   Semaglutide, 1 MG/DOSE, 4 MG/3ML  SOPN Inject 1 mg as directed once a week. 9 mL 1   spironolactone (ALDACTONE) 25 MG tablet TAKE 1 TABLET (25 MG TOTAL) BY MOUTH DAILY. 30 tablet 1   empagliflozin (JARDIANCE) 25 MG TABS tablet Take 1 tablet (25 mg total) by mouth daily. (Patient not taking: Reported on 10/24/2021) 90 tablet 1   sucralfate (CARAFATE) 1 g tablet TAKE 1 TABLET (1 G TOTAL) BY MOUTH 4 (FOUR) TIMES DAILY. (Patient not taking: Reported on 10/24/2021) 360 tablet 3   No current facility-administered medications for this visit.     Allergies:   Levothyroxine   Social History:  The patient  reports that she quit smoking about 38 years ago. Her smoking use included cigarettes. She has never used smokeless tobacco. She reports that she does not currently use alcohol. She reports current drug use. Frequency: 1.00 time per week. Drug:  Marijuana.   Family History:   family history includes Alzheimer's disease in her maternal grandfather; Asthma in her sister and sister; Breast cancer in her paternal aunt; Cancer in her sister; Diabetes in her sister and sister; Heart disease in her mother and sister; Hyperlipidemia in her brother and mother; Hypertension in her mother, sister, and sister.    Review of Systems: Review of Systems  Constitutional:  Positive for weight loss.  HENT: Negative.    Respiratory: Negative.    Cardiovascular: Negative.   Gastrointestinal: Negative.   Musculoskeletal: Negative.   Neurological: Negative.   Psychiatric/Behavioral: Negative.    All other systems reviewed and are negative.  PHYSICAL EXAM: VS:  BP 90/62 (BP Location: Left Arm, Patient Position: Sitting, Cuff Size: Normal)   Pulse 64   Ht 5' 5" (1.651 m)   Wt 174 lb 2 oz (79 kg)   SpO2 98%   BMI 28.98 kg/m  , BMI Body mass index is 28.98 kg/m.  Constitutional:  oriented to person, place, and time. No distress.  HENT:  Head: Grossly normal Eyes:  no discharge. No scleral icterus.  Neck: No JVD, no carotid bruits  Cardiovascular: Regular rate and rhythm, no murmurs appreciated Pulmonary/Chest: Clear to auscultation bilaterally, no wheezes or rails Abdominal: Soft.  no distension.  no tenderness.  Musculoskeletal: Normal range of motion Neurological:  normal muscle tone. Coordination normal. No atrophy Skin: Skin warm and dry Psychiatric: normal affect, pleasant  Recent Labs: 01/18/2021: B Natriuretic Peptide 39.1 11/13/2021: ALT 18; BUN 17; Creatinine, Ser 1.44; Hemoglobin 12.4; Platelets 214; Potassium 4.5; Sodium 128; TSH 1.440    Lipid Panel Lab Results  Component Value Date   CHOL 132 11/13/2021   HDL 28 (L) 11/13/2021   LDLCALC 49 11/13/2021   TRIG 361 (H) 11/13/2021      Wt Readings from Last 3 Encounters:  01/17/22 174 lb 2 oz (79 kg)  11/13/21 184 lb 12.8 oz (83.8 kg)  10/17/21 185 lb (83.9 kg)      ASSESSMENT AND PLAN:  Problem List Items Addressed This Visit       Cardiology Problems   Chronic systolic heart failure (HCC) - Primary (Chronic)   Pulmonary hypertension (HCC)   NICM (nonischemic cardiomyopathy) ([place])   Other Visit Diagnoses     Essential hypertension       ICD (implantable cardioverter-defibrillator) in place       OSA (obstructive sleep apnea)       CKD (chronic kidney disease), stage II       Poorly controlled diabetes mellitus ([place])  Nonischemic cardiomyopathy EF with improvement now up to 30 to 35% Sister who does medications does not present today Given 11 pound weight loss, blood pressure low or having mild orthostatic symptoms We will decrease the Entresto down to 49/51 mg twice a day Recommend she start verquvo 2.5 mg daily for 2 weeks then up to 5 mg daily for 2 weeks then up to 10 mg daily We will see her back in 1 to 2 months for further medication adjustment if she has further weight loss  Diabetes type 2 with complications Hemoglobin A1c dramatically worsened,  Numbers should be improving with Ozempic and 11 pound weight loss in the past 3 months  Bipolar 1 disorder/PTSD Followed by PMD Stable  Cirrhosis Followed by GI,  Recommend alcohol cessation Reports she continues to drink occasionally ,not every day   Total encounter time more than 30 minutes  Greater than 50% was spent in counseling and coordination of care with the patient    Signed, Esmond Plants, M.D., Ph.D. [place], [place]

## 2022-01-17 ENCOUNTER — Ambulatory Visit (INDEPENDENT_AMBULATORY_CARE_PROVIDER_SITE_OTHER): Payer: Medicare Other | Admitting: Cardiovascular Disease

## 2022-01-17 ENCOUNTER — Other Ambulatory Visit: Payer: Self-pay | Admitting: Cardiovascular Disease

## 2022-01-17 ENCOUNTER — Encounter: Payer: Self-pay | Admitting: Cardiovascular Disease

## 2022-01-17 VITALS — BP 90/62 | HR 64 | Ht 65.0 in | Wt 174.1 lb

## 2022-01-17 DIAGNOSIS — Z95810 Presence of automatic (implantable) cardiac defibrillator: Secondary | ICD-10-CM | POA: Diagnosis not present

## 2022-01-17 DIAGNOSIS — N182 Chronic kidney disease, stage 2 (mild): Secondary | ICD-10-CM | POA: Diagnosis not present

## 2022-01-17 DIAGNOSIS — I5022 Chronic systolic (congestive) heart failure: Secondary | ICD-10-CM

## 2022-01-17 DIAGNOSIS — E1165 Type 2 diabetes mellitus with hyperglycemia: Secondary | ICD-10-CM

## 2022-01-17 DIAGNOSIS — I200 Unstable angina: Secondary | ICD-10-CM | POA: Diagnosis not present

## 2022-01-17 DIAGNOSIS — I272 Pulmonary hypertension, unspecified: Secondary | ICD-10-CM | POA: Diagnosis not present

## 2022-01-17 DIAGNOSIS — G4733 Obstructive sleep apnea (adult) (pediatric): Secondary | ICD-10-CM | POA: Diagnosis not present

## 2022-01-17 DIAGNOSIS — I10 Essential (primary) hypertension: Secondary | ICD-10-CM

## 2022-01-17 DIAGNOSIS — I428 Other cardiomyopathies: Secondary | ICD-10-CM | POA: Diagnosis not present

## 2022-01-17 MED ORDER — ENTRESTO 49-51 MG PO TABS: 1.0000 | ORAL_TABLET | Freq: Two times a day (BID) | ORAL | 3 refills | Status: DC

## 2022-01-17 MED ORDER — VERQUVO 10 MG PO TABS: 10.0000 mg | ORAL_TABLET | Freq: Every day | ORAL | 3 refills | Status: DC

## 2022-01-17 NOTE — Patient Instructions (Addendum)
Medication Instructions:   Decrease the entresto down to 49/51 mg twice a day  start verquvo 2.5 mg once a day for 2 weeks then Increase to 5 mg once a day for two weeks then Increase to 10 mg once a day   Medication Samples have been provided to the patient.  Drug name: Aileen Pilot        Strength: 2.5mg /5mg          Qty: 14/14   LOT: [id number]   Exp.Date: 12/09/22    10/07/23  If you need a refill on your cardiac medications before your next appointment, please call your pharmacy.   Lab work: No new labs needed  Testing/Procedures: No new testing needed  Follow-Up: At [facility], you and your health needs are our priority.  As part of our continuing mission to provide you with exceptional heart care, we have created designated Provider Care Teams.  These Care Teams include your primary Cardiologist (physician) and Advanced Practice Providers (APPs -  Physician Assistants and Nurse Practitioners) who all work together to provide you with the care you need, when you need it.  You will need a follow up appointment in 2 months  Providers on your designated Care Team:   Nicolasa Ducking, NP Eula Listen, PA-C Cadence Fransico [place], [place]  COVID-19 Vaccine Information can be found at: [email or web address] For questions related to vaccine distribution or appointments, please email [email or web address] or call [phone].

## 2022-01-17 NOTE — Telephone Encounter (Signed)
Please see below. Verquvo not covered. Pharmacy requesting alternatives as listed. Please advise if appropriate. Thank you!

## 2022-01-19 MED ORDER — VERQUVO 10 MG PO TABS: 10.0000 mg | ORAL_TABLET | Freq: Every day | ORAL | 3 refills | Status: DC

## 2022-01-19 NOTE — Addendum Note (Signed)
Addended by: Vergia Alberts on: 01/19/2022 03:34 PM   Modules accepted: Orders

## 2022-01-24 ENCOUNTER — Ambulatory Visit (INDEPENDENT_AMBULATORY_CARE_PROVIDER_SITE_OTHER): Payer: Medicare Other | Admitting: Licensed Clinical Social Worker

## 2022-01-24 DIAGNOSIS — F410 Panic disorder [episodic paroxysmal anxiety] without agoraphobia: Secondary | ICD-10-CM

## 2022-01-24 DIAGNOSIS — Z794 Long term (current) use of insulin: Secondary | ICD-10-CM

## 2022-01-24 DIAGNOSIS — F3131 Bipolar disorder, current episode depressed, mild: Secondary | ICD-10-CM

## 2022-01-24 DIAGNOSIS — F1210 Cannabis abuse, uncomplicated: Secondary | ICD-10-CM

## 2022-01-25 ENCOUNTER — Other Ambulatory Visit: Payer: Self-pay | Admitting: Cardiovascular Disease

## 2022-01-26 ENCOUNTER — Other Ambulatory Visit: Payer: Self-pay | Admitting: Cardiovascular Disease

## 2022-01-29 ENCOUNTER — Other Ambulatory Visit: Payer: Self-pay | Admitting: Family Medicine

## 2022-01-29 NOTE — Telephone Encounter (Signed)
Requested medication (s) are due for refill today:   Yes  Requested medication (s) are on the active medication list:   Yes  Future visit scheduled:   Yes   Last ordered: 10/31/2021 #36, 0 refills  Returned because last prescribed by another provider   Requested Prescriptions  Pending Prescriptions Disp Refills   furosemide (LASIX) 20 MG tablet [Pharmacy Med Name: FUROSEMIDE 20 MG TABLET] 36 tablet 0    Sig: TAKE 1 TABLET (20 MG TOTAL) BY MOUTH AS DIRECTED. TAKE 20MG  (1 TABLET) MONDAY, WEDNESDAY, FRIDAY.     There is no refill protocol information for this order

## 2022-01-30 ENCOUNTER — Telehealth: Payer: Self-pay | Admitting: Cardiovascular Disease

## 2022-01-30 NOTE — Telephone Encounter (Signed)
Patient dropped off PAF, placed in box °

## 2022-01-30 NOTE — Chronic Care Management (AMB) (Signed)
Chronic Care Management    Clinical Social Work Note  01/30/2022 Name: Bonnie Ochoa MRN: [id number] DOB: 01-08-1966  Bonnie Ochoa is a 56 y.o. year old female who is a primary care patient of Valerie Roys, DO. The CCM team was consulted to assist the patient with chronic disease management and/or care coordination needs related to: Mental Health Counseling and Resources.   Engaged with patient by telephone for follow up visit in response to provider referral for social work chronic care management and care coordination services.   Consent to Services:  The patient was given information about Chronic Care Management services, agreed to services, and gave verbal consent prior to initiation of services.  Please see initial visit note for detailed documentation.   Patient agreed to services and consent obtained.   Assessment: Review of patient past medical history, allergies, medications, and health status, including review of relevant consultants reports was performed today as part of a comprehensive evaluation and provision of chronic care management and care coordination services.     SDOH (Social Determinants of Health) assessments and interventions performed:    Advanced Directives Status: Not addressed in this encounter.  CCM Care Plan  Allergies  Allergen Reactions   Levothyroxine Rash    Outpatient Encounter Medications as of 01/24/2022  Medication Sig   blood glucose meter kit and supplies KIT Dispense based on patient and insurance preference. Use up to four times daily as directed. (FOR ICD-9 250.00, 250.01).   cyclobenzaprine (FLEXERIL) 5 MG tablet TAKE 1 TABLET BY MOUTH THREE TIMES A DAY AS NEEDED FOR MUSCLE SPASMS   digoxin (LANOXIN) 0.125 MG tablet TAKE 1 TABLET BY MOUTH EVERY DAY   empagliflozin (JARDIANCE) 25 MG TABS tablet Take 1 tablet (25 mg total) by mouth daily. (Patient not taking: Reported on 10/24/2021)   famotidine (PEPCID) 40 MG tablet Take 1 tablet (40 mg  total) by mouth daily.   furosemide (LASIX) 20 MG tablet TAKE 1 TABLET (20 MG TOTAL) BY MOUTH AS DIRECTED. TAKE 20MG (1 TABLET) MONDAY, WEDNESDAY, FRIDAY.   insulin glargine (LANTUS SOLOSTAR) 100 UNIT/ML Solostar Pen Inject 45 Units into the skin daily at 10 pm.   Insulin Pen Needle 32G X 6 MM MISC 1 each by Does not apply route daily.   lamoTRIgine (LAMICTAL) 25 MG tablet Take 1 tablet (25 mg total) by mouth daily for 15 days, THEN 1 tablet (25 mg total) 2 (two) times daily for 15 days.   lamoTRIgine (LAMICTAL) 25 MG tablet Take 1 tablet (25 mg total) by mouth 2 (two) times daily.   metoCLOPramide (REGLAN) 10 MG tablet Take 10 mg by mouth 4 (four) times daily.   mirtazapine (REMERON) 7.5 MG tablet Take 1 tablet (7.5 mg total) by mouth at bedtime.   pantoprazole (PROTONIX) 40 MG tablet Take 1 tablet (40 mg total) by mouth daily.   rosuvastatin (CRESTOR) 40 MG tablet TAKE 1 TABLET BY MOUTH EVERY DAY   sacubitril-valsartan (ENTRESTO) 49-51 MG Take 1 tablet by mouth 2 (two) times daily.   Semaglutide, 1 MG/DOSE, 4 MG/3ML SOPN Inject 1 mg as directed once a week.   sucralfate (CARAFATE) 1 g tablet TAKE 1 TABLET (1 G TOTAL) BY MOUTH 4 (FOUR) TIMES DAILY. (Patient not taking: Reported on 10/24/2021)   Vericiguat (VERQUVO) 10 MG TABS Take 10 mg by mouth daily.   [DISCONTINUED] carvedilol (COREG) 12.5 MG tablet TAKE 1 TABLET (12.5MG TOTAL) BY MOUTH TWICE A DAY WITH MEALS   [DISCONTINUED] potassium chloride SA (KLOR-CON M20)  20 MEQ tablet TAKE 2 TABLETS BY MOUTH TWICE A DAY   [DISCONTINUED] spironolactone (ALDACTONE) 25 MG tablet TAKE 1 TABLET (25 MG TOTAL) BY MOUTH DAILY.   No facility-administered encounter medications on file as of 01/24/2022.    Patient Active Problem List   Diagnosis Date Noted   High risk medication use 10/24/2021   Bipolar 1 disorder, depressed, mild ([place]) 04/14/2020   Insomnia due to mental condition 02/16/2019   Bipolar disorder, current episode depressed, moderate ([place])  02/16/2019   Chronic post-traumatic stress disorder (PTSD) 02/16/2019   Panic disorder 02/16/2019   Cannabis use disorder, mild, abuse 02/16/2019   Unstable angina ([place]) 06/07/2018   Hypothyroid 04/30/2017   Multiple thyroid nodules 59/93/5701   Chronic systolic heart failure ([place]) 10/08/2016   Obstructive sleep apnea 10/08/2016   Cirrhosis ([place]) 09/18/2016   Pulmonary hypertension ([place]) 05/30/2016   Hypokalemia 05/30/2016   NICM (nonischemic cardiomyopathy) ([place]) 05/30/2016   Benign hypertensive renal disease    DM (diabetes mellitus), type 2 with renal complications ([place])    Asthma     Conditions to be addressed/monitored: DMII, Bipolar Disorder, and PTSD, Panic Disorder, and Cannabis use disorder  Care Plan : LCSW Plan of Care  Updates made by Rebekah Chesterfield, LCSW since 01/30/2022 12:00 AM     Problem: Symptoms of Depression and Anxiety      Long-Range Goal: Coping Skills Enhanced   Start Date: 06/01/2021  Expected End Date: 04/12/2022  This Visit's Progress: On track  Recent Progress: On track  Priority: High  Note:   Current barriers:   Severe Persistent Mental Health needs related to Bipolar Disorder, PTSD, Panic Disorder Transportation, Limited access to food, Mental Health Concerns , and Substance abuse issues -  [place] Needs Support, Education, and Care Coordination in order to meet unmet mental health needs. Clinical Goal(s): demonstrate a reduction in symptoms related to :Anxiety, Depression, and Bipolar Disorder  verbalize understanding of plan for management of DMII  explore community resource options for unmet needs related to:  Transportation, Food Insecurity , and Alcohol/Substance Use  Clinical Interventions:  Assessed patient's previous and current treatment, coping skills, support system and barriers to care  Patient states her appt went well. Medications were adjusted Patient endorses a weight loss of 11 lbs Managing physical health conditions  well Patient is not interested in rehab for marijuana use. Healthy coping skills identified to assist her with management of stress and MH conditions. Patient continues to participate in psychiatry and was encouraged to initiate counseling CCM LCSW discussed strategies to assist with stress management and self-care  CCM LCSW strongly encouraged patient to identify warning signs to when symptoms are unmanaged, in addition, to strategies to positively impact mood. Patient receives strong support from wife.  Depression screen reviewed  Mindfulness or Relaxation training provided Active listening / Reflection utilized  Emotional Support Provided Provided psychoeducation for mental health needs  Provided brief CBT  Quality of sleep assessed & Sleep Hygiene techniques promoted  Participation in support group encouraged  Verbalization of feelings encouraged  Crisis Resource Education / information provided  Suicidal Ideation/Homicidal Ideation assessed: Patient denies SI/HI 1:1 collaboration with primary care provider regarding development and update of comprehensive plan of care as evidenced by provider attestation and co-signature Inter-disciplinary care team collaboration (see longitudinal plan of care) Patient Goals/Self-Care Activities: Over the next 120 days Call [employer] [phone]  Continue with compliance of taking medication  Collaborate with the community resource care guide Increase coping skills, healthy habits,  and self-management skills Follow up with Manufacturing engineer for grief support       Follow Up Plan: SW will follow up with patient by phone over the next 6-8 weeks      Christa See, MSW, [email or web address] Phone [phone] 7:58 AM

## 2022-01-30 NOTE — Patient Instructions (Signed)
Visit Information  Thank you for taking time to visit with me today. Please don't hesitate to contact me if I can be of assistance to you before our next scheduled telephone appointment.  Following are the goals we discussed today:  Patient Goals/Self-Care Activities: Over the next 120 days Call [facility] Transportation [phone]  Continue with compliance of taking medication  Collaborate with the community resource care guide Increase coping skills, healthy habits, and self-management skills Follow up with Civil engineer, contracting for grief support  Our next appointment is by telephone on 02/28/22 at 10:45 AM  Please call the care guide team at [phone] if you need to cancel or reschedule your appointment.   If you are experiencing a Mental Health or Behavioral Health Crisis or need someone to talk to, please call the Suicide and Crisis Lifeline: 988 call 911   Patient verbalizes understanding of instructions and care plan provided today and agrees to view in MyChart. Active MyChart status and patient understanding of how to access instructions and care plan via MyChart confirmed with patient.     Jenel Lucks, MSW, LCSW [facility] Care Management [place]  [facility] [email or web address] Phone [phone] 7:59 AM

## 2022-01-30 NOTE — Telephone Encounter (Signed)
Patient Assistance Application faxed to [employer].   Application placed in filing cabinet in medication closet.

## 2022-02-05 ENCOUNTER — Telehealth (INDEPENDENT_AMBULATORY_CARE_PROVIDER_SITE_OTHER): Payer: Medicare Other | Admitting: Psychiatry

## 2022-02-05 ENCOUNTER — Encounter: Payer: Self-pay | Admitting: Psychiatry

## 2022-02-05 DIAGNOSIS — F4312 Post-traumatic stress disorder, chronic: Secondary | ICD-10-CM

## 2022-02-05 DIAGNOSIS — F1210 Cannabis abuse, uncomplicated: Secondary | ICD-10-CM

## 2022-02-05 DIAGNOSIS — F410 Panic disorder [episodic paroxysmal anxiety] without agoraphobia: Secondary | ICD-10-CM

## 2022-02-05 DIAGNOSIS — F3162 Bipolar disorder, current episode mixed, moderate: Secondary | ICD-10-CM | POA: Diagnosis not present

## 2022-02-05 DIAGNOSIS — Z79899 Other long term (current) drug therapy: Secondary | ICD-10-CM

## 2022-02-05 DIAGNOSIS — Z91148 Patient's other noncompliance with medication regimen for other reason: Secondary | ICD-10-CM

## 2022-02-05 DIAGNOSIS — G4701 Insomnia due to medical condition: Secondary | ICD-10-CM

## 2022-02-05 MED ORDER — LAMOTRIGINE 25 MG PO TABS: 25.0000 mg | ORAL_TABLET | Freq: Every day | ORAL | 0 refills | Status: DC

## 2022-02-05 MED ORDER — MIRTAZAPINE 7.5 MG PO TABS: 7.5000 mg | ORAL_TABLET | Freq: Every day | ORAL | 0 refills | Status: DC

## 2022-02-06 ENCOUNTER — Other Ambulatory Visit
Admission: RE | Admit: 2022-02-06 | Discharge: 2022-02-06 | Disposition: A | Discharge status: Home / Self Care | Payer: Medicare Other | Attending: Psychiatry | Admitting: Psychiatry

## 2022-02-06 ENCOUNTER — Other Ambulatory Visit: Payer: Self-pay | Admitting: Family Medicine

## 2022-02-06 DIAGNOSIS — F1210 Cannabis abuse, uncomplicated: Secondary | ICD-10-CM | POA: Diagnosis not present

## 2022-02-06 DIAGNOSIS — F3162 Bipolar disorder, current episode mixed, moderate: Secondary | ICD-10-CM | POA: Insufficient documentation

## 2022-02-06 DIAGNOSIS — Z79899 Other long term (current) drug therapy: Secondary | ICD-10-CM | POA: Insufficient documentation

## 2022-02-09 ENCOUNTER — Telehealth: Payer: Self-pay | Admitting: Cardiovascular Disease

## 2022-02-09 DIAGNOSIS — E1122 Type 2 diabetes mellitus with diabetic chronic kidney disease: Secondary | ICD-10-CM

## 2022-02-09 DIAGNOSIS — F3131 Bipolar disorder, current episode depressed, mild: Secondary | ICD-10-CM

## 2022-02-09 DIAGNOSIS — N1831 Chronic kidney disease, stage 3a: Secondary | ICD-10-CM

## 2022-02-09 DIAGNOSIS — Z794 Long term (current) use of insulin: Secondary | ICD-10-CM

## 2022-02-09 NOTE — Telephone Encounter (Signed)
Pt c/o medication issue:  1. Name of Medication:   Vericiguat (VERQUVO) 10 MG TABS    2. How are you currently taking this medication (dosage and times per day)?   Take 10 mg by mouth daily.Patient not taking: Reported on 02/05/2022      3. Are you having a reaction (difficulty breathing--STAT)? No  4. What is your medication issue? Called stating that the application for the pt's Pt Assistance is considered "invalid", do to some items not being "checked" on the application. He would like a call back regarding this matter so that the application can be submitted. Please advise

## 2022-02-09 NOTE — Telephone Encounter (Signed)
Attempted to call: [phone] This was a # for [employer] and they could not ID this patient.  I pulled her assistance form and it was noted at the bottom of the form that the [employer] # is [phone].  Called [employer] and spoke with rep.  They advised at the top of page 1 there are 2 boxes that we would need to look at and check to specify what we were referring the patient for:  I marked box # 2: Referral to the [employer] Patient Assistance Program for eligibility determination (provided through the [employer]   I was advised by the [employer] rep to fax back just page 1 and they will update the case manager this will be coming.  I have faxed the updated & completed Page 1 of the application to [phone]. Fax confirmation received.

## 2022-02-10 LAB — PANEL 799049
CARBOXY THC GC/MS CONF: 203 ng/mL
Cannabinoid GC/MS, Ur: POSITIVE — AB

## 2022-02-10 LAB — URINE DRUGS OF ABUSE SCREEN W ALC, ROUTINE (REF LAB)
Amphetamines, Urine: NEGATIVE ng/mL
Barbiturate, Ur: NEGATIVE ng/mL
Benzodiazepine Quant, Ur: NEGATIVE ng/mL
Cocaine (Metab.): NEGATIVE ng/mL
Ethanol U, Quan: NEGATIVE %
Methadone Screen, Urine: NEGATIVE ng/mL
Opiate Quant, Ur: NEGATIVE ng/mL
Phencyclidine, Ur: NEGATIVE ng/mL
Propoxyphene, Urine: NEGATIVE ng/mL

## 2022-02-12 ENCOUNTER — Encounter: Payer: Self-pay | Admitting: Family Medicine

## 2022-02-12 ENCOUNTER — Ambulatory Visit (INDEPENDENT_AMBULATORY_CARE_PROVIDER_SITE_OTHER): Payer: Medicare HMO | Admitting: Family Medicine

## 2022-02-12 VITALS — BP 104/73 | HR 68 | Temp 98.2°F | Ht 65.0 in | Wt 175.6 lb

## 2022-02-12 DIAGNOSIS — E1122 Type 2 diabetes mellitus with diabetic chronic kidney disease: Secondary | ICD-10-CM | POA: Diagnosis not present

## 2022-02-12 DIAGNOSIS — N1831 Chronic kidney disease, stage 3a: Secondary | ICD-10-CM

## 2022-02-12 DIAGNOSIS — I200 Unstable angina: Secondary | ICD-10-CM

## 2022-02-12 DIAGNOSIS — Z794 Long term (current) use of insulin: Secondary | ICD-10-CM | POA: Diagnosis not present

## 2022-02-12 DIAGNOSIS — M25512 Pain in left shoulder: Secondary | ICD-10-CM | POA: Diagnosis not present

## 2022-02-12 DIAGNOSIS — G8929 Other chronic pain: Secondary | ICD-10-CM | POA: Diagnosis not present

## 2022-02-12 LAB — BAYER DCA HB A1C WAIVED: HB A1C (BAYER DCA - WAIVED): 10.1 % — ABNORMAL HIGH (ref 4.8–5.6)

## 2022-02-12 MED ORDER — GABAPENTIN 100 MG PO CAPS: 100.0000 mg | ORAL_CAPSULE | Freq: Every day | ORAL | 3 refills | Status: DC

## 2022-02-12 MED ORDER — SEMAGLUTIDE (2 MG/DOSE) 8 MG/3ML ~~LOC~~ SOPN: 2.0000 mg | PEN_INJECTOR | SUBCUTANEOUS | 1 refills | Status: DC

## 2022-02-12 NOTE — Assessment & Plan Note (Signed)
Improved with A1c of 10.1 down from 11. Will increase her ozempic to 2mg  and recheck 3 months. Call with any concerns.

## 2022-02-12 NOTE — Progress Notes (Signed)
BP 104/73   Pulse 68   Temp 98.2 F (36.8 C) (Oral)   Ht 5\' 5"  (1.651 m)   Wt 175 lb 9.6 oz (79.7 kg)   SpO2 98%   BMI 29.22 kg/m    Subjective:    Patient ID: , female    DOB: 09/23/1965, 56 y.o.   MRN: [id number]  HPI: Bonnie Ochoa is a 56 y.o. female  Chief Complaint  Patient presents with   Diabetes   Shoulder Pain    Left shoulder pain x 2 months   DIABETES Hypoglycemic episodes:no Polydipsia/polyuria: no Visual disturbance: no Chest pain: no Paresthesias: no Glucose Monitoring: no  Accucheck frequency: Not Checking Taking Insulin?: no Blood Pressure Monitoring: not checking Retinal Examination: Up to Date Foot Exam: Up to Date Diabetic Education: Completed Pneumovax: Up to Date Influenza: Up to Date Aspirin: no  SHOULDER PAIN Duration: 2 months Involved shoulder: left Mechanism of injury: unknown Location: diffuse Onset:sudden Severity: moderate  Quality:  aching Frequency: constant Radiation: no Aggravating factors: laying on it  Alleviating factors: tylenol and not moving it  Status: better Treatments attempted: rest and APAP  Relief with NSAIDs?:  No NSAIDs Taken Weakness: yes Numbness: yes Decreased grip strength: yes Redness: no Swelling: no Bruising: no Fevers: no  Relevant past medical, surgical, family and social history reviewed and updated as indicated. Interim medical history since our last visit reviewed. Allergies and medications reviewed and updated.  Review of Systems  Constitutional: Negative.   Respiratory: Negative.    Cardiovascular: Negative.   Musculoskeletal:  Positive for arthralgias and myalgias. Negative for back pain, gait problem, joint swelling, neck pain and neck stiffness.  Neurological: Negative.   Psychiatric/Behavioral: Negative.      Per HPI unless specifically indicated above     Objective:    BP 104/73   Pulse 68   Temp 98.2 F (36.8 C) (Oral)   Ht 5\' 5"  (1.651 m)   Wt 175 lb  9.6 oz (79.7 kg)   SpO2 98%   BMI 29.22 kg/m   Wt Readings from Last 3 Encounters:  02/12/22 175 lb 9.6 oz (79.7 kg)  01/17/22 174 lb 2 oz (79 kg)  11/13/21 184 lb 12.8 oz (83.8 kg)    Physical Exam Vitals and nursing note reviewed.  Constitutional:      General: She is not in acute distress.    Appearance: Normal appearance. She is not ill-appearing, toxic-appearing or diaphoretic.  HENT:     Head: Normocephalic and atraumatic.     Right Ear: External ear normal.     Left Ear: External ear normal.     Nose: Nose normal.     Mouth/Throat:     Mouth: Mucous membranes are moist.     Pharynx: Oropharynx is clear.  Eyes:     General: No scleral icterus.       Right eye: No discharge.        Left eye: No discharge.     Extraocular Movements: Extraocular movements intact.     Conjunctiva/sclera: Conjunctivae normal.     Pupils: Pupils are equal, round, and reactive to light.  Cardiovascular:     Rate and Rhythm: Normal rate and regular rhythm.     Pulses: Normal pulses.     Heart sounds: Normal heart sounds. No murmur heard.    No friction rub. No gallop.  Pulmonary:     Effort: Pulmonary effort is normal. No respiratory distress.     Breath sounds: Normal  breath sounds. No stridor. No wheezing, rhonchi or rales.  Chest:     Chest wall: No tenderness.  Musculoskeletal:        General: Normal range of motion.     Cervical back: Normal range of motion and neck supple.  Skin:    General: Skin is warm and dry.     Capillary Refill: Capillary refill takes less than 2 seconds.     Coloration: Skin is not jaundiced or pale.     Findings: No bruising, erythema, lesion or rash.  Neurological:     General: No focal deficit present.     Mental Status: She is alert and oriented to person, place, and time. Mental status is at baseline.  Psychiatric:        Mood and Affect: Mood normal.        Behavior: Behavior normal.        Thought Content: Thought content normal.         Judgment: Judgment normal.     Results for orders placed or performed in visit on 02/12/22  Bayer DCA Hb A1c Waived  Result Value Ref Range   HB A1C (BAYER DCA - WAIVED) 10.1 (H) 4.8 - 5.6 %      Assessment & Plan:   Problem List Items Addressed This Visit       Endocrine   DM (diabetes mellitus), type 2 with renal complications (HCC) - Primary    Improved with A1c of 10.1 down from 11. Will increase her ozempic to 2mg  and recheck 3 months. Call with any concerns.       Relevant Medications   Semaglutide, 2 MG/DOSE, 8 MG/3ML SOPN   Other Relevant Orders   Bayer DCA Hb A1c Waived (Completed)   Other Visit Diagnoses     Chronic left shoulder pain       Will check x-rays and treat as needed. Start gabapentin for pain. Await results.   Relevant Medications   gabapentin (NEURONTIN) 100 MG capsule   Other Relevant Orders   DG Shoulder Left   DG Cervical Spine Complete        Follow up plan: Return in about 3 months (around 05/15/2022).

## 2022-02-14 ENCOUNTER — Ambulatory Visit
Admission: RE | Admit: 2022-02-14 | Discharge: 2022-02-14 | Disposition: A | Discharge status: Home / Self Care | Payer: Medicare HMO | Attending: Family Medicine | Admitting: Family Medicine

## 2022-02-14 ENCOUNTER — Ambulatory Visit
Admission: RE | Admit: 2022-02-14 | Discharge: 2022-02-14 | Disposition: A | Discharge status: Home / Self Care | Payer: Medicare HMO | Source: Ambulatory Visit | Attending: Family Medicine | Admitting: Family Medicine

## 2022-02-14 DIAGNOSIS — M25512 Pain in left shoulder: Secondary | ICD-10-CM | POA: Diagnosis not present

## 2022-02-14 DIAGNOSIS — G8929 Other chronic pain: Secondary | ICD-10-CM

## 2022-02-14 DIAGNOSIS — M50323 Other cervical disc degeneration at C6-C7 level: Secondary | ICD-10-CM | POA: Diagnosis not present

## 2022-02-14 DIAGNOSIS — M19012 Primary osteoarthritis, left shoulder: Secondary | ICD-10-CM | POA: Diagnosis not present

## 2022-02-15 ENCOUNTER — Telehealth: Payer: Self-pay | Admitting: Internal Medicine

## 2022-02-15 NOTE — Telephone Encounter (Signed)
3 attempts to schedule fu appt from recall list.   Deleting recall.   

## 2022-02-19 ENCOUNTER — Other Ambulatory Visit: Payer: Self-pay | Admitting: Family Medicine

## 2022-02-19 DIAGNOSIS — M5412 Radiculopathy, cervical region: Secondary | ICD-10-CM

## 2022-02-21 NOTE — Telephone Encounter (Signed)
Called and spoke with Maralyn Sago.   Tanysha Quant asking for prior authorization number.   I advised that a PA was not submitted, but that we were notified on 01/17/22 by the patient's pharmacy that it is not covered and they were asking for substitutes.   Maralyn Sago reports that it seems that [facility] had told them that they needed a PA. I advised that I did not see anywhere in our system where patient has [employer], only Medicare A/B and LME Medicaid.   Arietta Eisenstein sent message to case manager who has been working on this and said that she said that the patient has a [employer] part D primary plan for medications and that medicaid is secondary.   Case manager stated that [employer] requires that a PA be submitted first, showing denial.   Maralyn Sago asks that PA be submitted through covermymeds using KEY# [id number]  Advised Maralyn Sago that I will forward this to refill pool to initiate PA.

## 2022-02-21 NOTE — Telephone Encounter (Signed)
Victorino Dike from [employer] is calling back in regards to Patient Chief of Staff. They are need authorization number. Requesting to speak to Maralyn Sago.

## 2022-02-22 ENCOUNTER — Other Ambulatory Visit: Payer: Self-pay | Admitting: *Deleted

## 2022-02-22 ENCOUNTER — Telehealth: Payer: Self-pay | Admitting: Cardiovascular Disease

## 2022-02-22 NOTE — Telephone Encounter (Signed)
  Pt c/o medication issue:  1. Name of Medication: Vericiguat (VERQUVO) 10 MG TABS  2. How are you currently taking this medication (dosage and times per day)? Take 10 mg by mouth daily  3. Are you having a reaction (difficulty breathing--STAT)? no  4. What is your medication issue? May with [employer] clinical pharmacy review calling to get PA for this prescription

## 2022-02-22 NOTE — Telephone Encounter (Signed)
Please see note below concerning Verquvo PA. 

## 2022-02-22 NOTE — Telephone Encounter (Signed)
Please see note below concerning Verquvo PA.

## 2022-02-22 NOTE — Telephone Encounter (Signed)
Prior Auth initiated through [email or web address] KEY: [id number] Response: Your information has been submitted to [facility]. [employer] will review the request and will issue a decision, typically within 3-7 days from your submission. You can check the updated outcome later by reopening this request.  If [employer] has not responded in 3-7 days or if you have any questions about your ePA request, please contact [employer] at [phone].

## 2022-02-23 NOTE — Telephone Encounter (Signed)
Bonnie Ochoa with [employer] is following up. She states they have a few additional clinical questions to be answered for appeal.   Phone#: [phone] (member#: [id number])

## 2022-02-23 NOTE — Telephone Encounter (Signed)
Called to speak with May and was told that the questions expired because I did not call back yesterday afternoon however I was off work yesterday afternoon. She transferred me to the Appeal Dept at which time I spoke with Tiana in Appeals Dept to initiate appeal. All questions answered at this time. Ref# [id number]; Appeals Team phone # (to check on status if necessary ) [phone]

## 2022-02-26 NOTE — Telephone Encounter (Signed)
Incoming fax received from [place].   "We agree with our initial coverage determination and are denying the following prescription drug(s) that you or your physician or other prescriber requested: VERQUVO 10 mg tablet  We denied this request because: Your prescriber wants to use Verquvo to treat your heart failure. Part D drugs must meet medical standards for use.... The standard(s) that must be met for use is/are: symptomatic heart failure; prior hospitalization for heart failure, OR outpatient IV diuretic use. We have reviewed your case. Right now, you do not meet the standard(s) for use of this drug."  Will forward to MD.

## 2022-02-26 NOTE — Telephone Encounter (Signed)
Spoke with Monique at [facility] to answer further questions required to complete PA. She states our office will be notified of the outcome within 24-72 hours.

## 2022-02-28 ENCOUNTER — Other Ambulatory Visit: Payer: Self-pay | Admitting: Psychiatry

## 2022-02-28 ENCOUNTER — Other Ambulatory Visit: Payer: Self-pay | Admitting: Family Medicine

## 2022-02-28 ENCOUNTER — Telehealth: Payer: Medicare Other

## 2022-02-28 DIAGNOSIS — F4312 Post-traumatic stress disorder, chronic: Secondary | ICD-10-CM

## 2022-02-28 DIAGNOSIS — F3162 Bipolar disorder, current episode mixed, moderate: Secondary | ICD-10-CM

## 2022-02-28 DIAGNOSIS — F410 Panic disorder [episodic paroxysmal anxiety] without agoraphobia: Secondary | ICD-10-CM

## 2022-02-28 DIAGNOSIS — G4701 Insomnia due to medical condition: Secondary | ICD-10-CM

## 2022-02-28 NOTE — Telephone Encounter (Signed)
Requested Prescriptions  Pending Prescriptions Disp Refills  . pantoprazole (PROTONIX) 40 MG tablet [Pharmacy Med Name: PANTOPRAZOLE SOD DR 40 MG TAB] 90 tablet 1    Sig: TAKE 1 TABLET BY MOUTH EVERY DAY     Gastroenterology: Proton Pump Inhibitors Passed - 02/28/2022  2:36 AM      Passed - Valid encounter within last 12 months    Recent Outpatient Visits          2 weeks ago Type 2 diabetes mellitus with stage 3a chronic kidney disease, with long-term current use of insulin (HCC)   [facility] [place], Megan P, DO   3 months ago Benign hypertensive renal disease   [facility] [place], Megan P, DO   5 months ago Benign hypertensive renal disease   [facility] [place], Megan P, DO   9 months ago Type 2 diabetes mellitus with stage 3a chronic kidney disease, with long-term current use of insulin (HCC)   [facility] [place], Megan P, DO   10 months ago Type 2 diabetes mellitus with stage 3a chronic kidney disease, with long-term current use of insulin (HCC)   [facility] [place], [place], DO      Future Appointments            In 1 month Gollan, Tollie Pizza, MD [facility]   In 2 months Marvin, Oralia Rud, DO [employer] [facility], [facility]

## 2022-03-05 ENCOUNTER — Encounter: Payer: Self-pay | Admitting: Psychiatry

## 2022-03-05 ENCOUNTER — Ambulatory Visit (INDEPENDENT_AMBULATORY_CARE_PROVIDER_SITE_OTHER): Payer: Medicare HMO | Admitting: Psychiatry

## 2022-03-05 VITALS — BP 126/74 | HR 80 | Temp 97.8°F | Wt 175.8 lb

## 2022-03-05 DIAGNOSIS — F410 Panic disorder [episodic paroxysmal anxiety] without agoraphobia: Secondary | ICD-10-CM | POA: Diagnosis not present

## 2022-03-05 DIAGNOSIS — F3178 Bipolar disorder, in full remission, most recent episode mixed: Secondary | ICD-10-CM | POA: Diagnosis not present

## 2022-03-05 DIAGNOSIS — F4312 Post-traumatic stress disorder, chronic: Secondary | ICD-10-CM | POA: Diagnosis not present

## 2022-03-05 DIAGNOSIS — G4701 Insomnia due to medical condition: Secondary | ICD-10-CM | POA: Diagnosis not present

## 2022-03-05 DIAGNOSIS — F1210 Cannabis abuse, uncomplicated: Secondary | ICD-10-CM | POA: Diagnosis not present

## 2022-03-05 DIAGNOSIS — Z79899 Other long term (current) drug therapy: Secondary | ICD-10-CM | POA: Diagnosis not present

## 2022-03-05 MED ORDER — LAMOTRIGINE 25 MG PO TABS: 25.0000 mg | ORAL_TABLET | Freq: Every day | ORAL | 0 refills | Status: DC

## 2022-03-05 MED ORDER — MIRTAZAPINE 7.5 MG PO TABS: 7.5000 mg | ORAL_TABLET | Freq: Every day | ORAL | 0 refills | Status: DC

## 2022-03-05 NOTE — Patient Instructions (Signed)
[email or web address] ? ?[email or web address] ? ?

## 2022-03-05 NOTE — Progress Notes (Unsigned)
[facility] MD OP Progress Note  03/05/2022 11:04 AM Bonnie Ochoa  MRN:  [id number]  Chief Complaint:  Chief Complaint  Patient presents with   Follow-up: 56 year old African-American female with bipolar disorder, PTSD, panic disorder, multiple medical problems presented for medication management.   HPI: Bonnie Ochoa is a 56 year old African-American female, engaged, lives in [place], has a history of bipolar disorder, PTSD, cannabis use disorder, insomnia on CPAP for OSA, RLS, diabetes, cirrhosis of liver, chronic renal failure, congestive heart failure, hypertension was evaluated in office today.  Patient today reports she is currently improving on the current medication regimen.  Compliant on the mirtazapine and the Lamictal.  Denies side effects.  Reports sleep is overall good.  Reports she continues to use cannabis, agreeable to cut back.  Receptive to counseling.  Patient denies any suicidality, homicidality or perceptual disturbances.  Patient denies any other concerns today.  Visit Diagnosis:    ICD-10-CM   1. Bipolar disorder, in full remission, most recent episode mixed (HCC)  F31.78 mirtazapine (REMERON) 7.5 MG tablet    lamoTRIgine (LAMICTAL) 25 MG tablet   moderate    2. Chronic post-traumatic stress disorder (PTSD)  F43.12 mirtazapine (REMERON) 7.5 MG tablet    3. Panic disorder  F41.0 mirtazapine (REMERON) 7.5 MG tablet    4. Cannabis use disorder, mild, abuse  F12.10     5. Insomnia due to medical condition  G47.01    mood , OSA    6. High risk medication use  Z79.899       Past Psychiatric History: Reviewed past psychiatric history from progress note on 10/16/2017.  Past trials of medications like risperidone, trazodone, Ambien, Lexapro, Lamictal, Seroquel.  Past Medical History:  Past Medical History:  Diagnosis Date   Abdominal pain    a. 05/2018 HIDA scan wnl.   ADHD    AICD (automatic cardioverter/defibrillator) present    a. 11/2014 s/p SJM Fortify  Assura, single lead AICD (ser# [id number]).   Anemia    Arthritis    Asthma    Bipolar 1 disorder ([place])    Chest pain    a. Hx of cath in [place] - reportedly nl; b. 04/2018 MV: EF 22%, fixed dist ant septal, apical, and inferoapical defects - ? scar vs. attenuation. No ischemia.   CHF (congestive heart failure) (HCC)    Cirrhosis of liver ([place])    Coronary artery disease    Depression    Diabetes mellitus without complication ([place])    Diverticulitis    Dysrhythmia    Heart murmur    HFrEF (heart failure with reduced ejection fraction) ([place])    a. 06/2017 Echo: EF 20-25%, diff HK. Mildly dil LA; b. 05/2018 Echo: EF 25-30%, diff HK, Gr2 DD. Triv AI. Mod MR. Mildly reduced RV fxn. Mod-Sev TR. PASP 45-65mHg.   Hypertension    Hypothyroidism    IBS (irritable bowel syndrome)    Insomnia    Migraines    NICM ([place]ischemic cardiomyopathy) ([place]    a. EF prev 25%; b. 11/2014 s/p SJM Fortify Assura, single lead AICD (ser# [id number]; c. 06/2017 Echo: EF 20-25%; d. 05/2018 Echo: EF 25-30%, diff HK, Gr2 DD; e. 05/2018 Echo: EF 25-30%; f. 05/2018 Cath: Nl cors. LVEDP 266mg, PCWP 3259m. PA 65/40 (52). CO/CI 3.04/1.56; c. 07/2018 CPX: Mod HF limitation.   OSA (obstructive sleep apnea)    CPAP is broken   PTSD (post-traumatic stress disorder)    Restless leg syndrome    Vertigo     Past  Surgical History:  Procedure Laterality Date   ABDOMINAL HYSTERECTOMY     CARDIAC CATHETERIZATION     CATARACT EXTRACTION W/PHACO Right 01/27/2019   Procedure: CATARACT EXTRACTION PHACO AND INTRAOCULAR LENS PLACEMENT ([place])  VISION BLUE RIGHT DIABETES;  Surgeon: Birder Robson, MD;  Location: [facility];  Service: Ophthalmology;  Laterality: Right;  Diabetes - insulin sleep apnea   CATARACT EXTRACTION W/PHACO Left 03/08/2020   Procedure: CATARACT EXTRACTION PHACO AND INTRAOCULAR LENS PLACEMENT ([facility]) LEFT DIABETIC 10.28  00:57.8;  Surgeon: Birder Robson, MD;  Location: [facility];  Service:  Ophthalmology;  Laterality: Left;   COLONOSCOPY WITH PROPOFOL N/A 03/11/2019   Procedure: COLONOSCOPY WITH PROPOFOL;  Surgeon: Virgel Manifold, MD;  Location: [facility];  Service: Endoscopy;  Laterality: N/A;   CORONARY ARTERY BYPASS GRAFT     Pt denies   ESOPHAGOGASTRODUODENOSCOPY (EGD) WITH PROPOFOL N/A 03/11/2019   Procedure: ESOPHAGOGASTRODUODENOSCOPY (EGD) WITH PROPOFOL;  Surgeon: Virgel Manifold, MD;  Location: [facility];  Service: Endoscopy;  Laterality: N/A;   INSERT / REPLACE / REMOVE PACEMAKER     ICD   INSERTION OF ICD  2016   St Jude Single chamber ICD   RIGHT/LEFT HEART CATH AND CORONARY ANGIOGRAPHY N/A 06/09/2018   Procedure: RIGHT/LEFT HEART CATH AND CORONARY ANGIOGRAPHY;  Surgeon: Wellington Hampshire, MD;  Location: [facility];  Service: Cardiovascular;  Laterality: N/A;   TONSILLECTOMY     TUBAL LIGATION  1980    Family Psychiatric History: Reviewed family psychiatric history from progress note on 10/16/2017.  Family History:  Family History  Problem Relation Age of Onset   Hypertension Mother    Hyperlipidemia Mother    Heart disease Mother    Hypertension Sister    Asthma Sister    Heart disease Sister    Diabetes Sister    Cancer Sister    Alzheimer's disease Maternal Grandfather    Hyperlipidemia Brother    Asthma Sister    Hypertension Sister    Diabetes Sister    Breast cancer Paternal Aunt     Social History: Reviewed social history from progress note on 10/16/2017. Social History   Socioeconomic History   Marital status: Married    Spouse name: Not on file   Number of children: 2   Years of education: Not on file   Highest education level: High school graduate  Occupational History    Comment: disabled  Tobacco Use   Smoking status: Former    Types: Cigarettes    Quit date: 1985    Years since quitting: 38.5   Smokeless tobacco: Never   Tobacco comments:    quit over  20 years ago   Vaping Use   Vaping Use: Never  used  Substance and Sexual Activity   Alcohol use: Not Currently   Drug use: Yes    Frequency: 1.0 times per week    Types: Marijuana    Comment: occassionally   Sexual activity: Yes    Birth control/protection: Surgical    Comment: one partner  Other Topics Concern   Not on file  Social History Narrative   Volunteers occasionally    Social Determinants of Health   Financial Resource Strain: Medium Risk (09/11/2021)   Overall Financial Resource Strain (CARDIA)    Difficulty of Paying Living Expenses: Somewhat hard  Food Insecurity: No Food Insecurity (09/11/2021)   Hunger Vital Sign    Worried About Running Out of Food in the Last Year: Never true  Ran Out of Food in the Last Year: Never true  Transportation Needs: No Transportation Needs (09/11/2021)   PRAPARE - Hydrologist (Medical): No    Lack of Transportation (Non-Medical): No  Physical Activity: Inactive (09/11/2021)   Exercise Vital Sign    Days of Exercise per Week: 0 days    Minutes of Exercise per Session: 0 min  Stress: Stress Concern Present (09/11/2021)   [place]    Feeling of Stress : To some extent  Social Connections: Socially Isolated (09/11/2021)   Social Connection and Isolation Panel [NHANES]    Frequency of Communication with Friends and Family: Once a week    Frequency of Social Gatherings with Friends and Family: Once a week    Attends Religious Services: Never    Marine scientist or Organizations: No    Attends Archivist Meetings: Never    Marital Status: Married    Allergies:  Allergies  Allergen Reactions   Levothyroxine Rash    Metabolic Disorder Labs: Lab Results  Component Value Date   HGBA1C 10.1 (H) 02/12/2022   MPG 185.77 05/08/2018   MPG >398 07/03/2017   No results found for: "PROLACTIN" Lab Results  Component Value Date   CHOL 132 11/13/2021   TRIG 361 (H)  11/13/2021   HDL 28 (L) 11/13/2021   CHOLHDL 5.4 05/09/2018   VLDL 53 (H) 05/09/2018   LDLCALC 49 11/13/2021   LDLCALC 86 09/14/2021   Lab Results  Component Value Date   TSH 1.440 11/13/2021   TSH 3.030 09/14/2021    Therapeutic Level Labs: No results found for: "LITHIUM" No results found for: "VALPROATE" No results found for: "CBMZ"  Current Medications: Current Outpatient Medications  Medication Sig Dispense Refill   blood glucose meter kit and supplies KIT Dispense based on patient and insurance preference. Use up to four times daily as directed. (FOR ICD-9 250.00, 250.01). 1 each 0   carvedilol (COREG) 12.5 MG tablet TAKE 1 TABLET (12.5MG TOTAL) BY MOUTH TWICE A DAY WITH MEALS 180 tablet 0   cyclobenzaprine (FLEXERIL) 5 MG tablet TAKE 1 TABLET BY MOUTH THREE TIMES A DAY AS NEEDED FOR MUSCLE SPASMS 60 tablet 1   digoxin (LANOXIN) 0.125 MG tablet TAKE 1 TABLET BY MOUTH EVERY DAY 90 tablet 0   empagliflozin (JARDIANCE) 25 MG TABS tablet Take 1 tablet (25 mg total) by mouth daily. 90 tablet 1   famotidine (PEPCID) 40 MG tablet TAKE 1 TABLET BY MOUTH EVERY DAY 90 tablet 2   furosemide (LASIX) 20 MG tablet TAKE 1 TABLET (20 MG TOTAL) BY MOUTH AS DIRECTED. TAKE 20MG (1 TABLET) MONDAY, WEDNESDAY, FRIDAY. 36 tablet 0   gabapentin (NEURONTIN) 100 MG capsule Take 1 capsule (100 mg total) by mouth at bedtime. 30 capsule 3   Insulin Pen Needle 32G X 6 MM MISC 1 each by Does not apply route daily. 100 each 12   metoCLOPramide (REGLAN) 10 MG tablet Take 10 mg by mouth 4 (four) times daily.     pantoprazole (PROTONIX) 40 MG tablet TAKE 1 TABLET BY MOUTH EVERY DAY 90 tablet 1   potassium chloride SA (KLOR-CON M20) 20 MEQ tablet TAKE 2 TABLETS BY MOUTH TWICE A DAY 120 tablet 3   rosuvastatin (CRESTOR) 40 MG tablet TAKE 1 TABLET BY MOUTH EVERY DAY 90 tablet 0   sacubitril-valsartan (ENTRESTO) 49-51 MG Take 1 tablet by mouth 2 (two) times daily. 60 tablet  3   Semaglutide, 2 MG/DOSE, 8 MG/3ML SOPN  Inject 2 mg as directed once a week. 9 mL 1   spironolactone (ALDACTONE) 25 MG tablet TAKE 1 TABLET (25 MG TOTAL) BY MOUTH DAILY. 30 tablet 3   sucralfate (CARAFATE) 1 g tablet TAKE 1 TABLET (1 G TOTAL) BY MOUTH 4 (FOUR) TIMES DAILY. 360 tablet 3   Vericiguat (VERQUVO) 10 MG TABS Take 10 mg by mouth daily. 30 tablet 3   insulin glargine (LANTUS SOLOSTAR) 100 UNIT/ML Solostar Pen Inject 45 Units into the skin daily at 10 pm. 45 mL 1   lamoTRIgine (LAMICTAL) 25 MG tablet Take 1 tablet (25 mg total) by mouth daily. 90 tablet 0   mirtazapine (REMERON) 7.5 MG tablet Take 1 tablet (7.5 mg total) by mouth at bedtime. 90 tablet 0   No current facility-administered medications for this visit.     Musculoskeletal: Strength & Muscle Tone: within normal limits Gait & Station: normal Patient leans: N/A  Psychiatric Specialty Exam: Review of Systems  Psychiatric/Behavioral: Negative.    All other systems reviewed and are negative.   Blood pressure 126/74, pulse 80, temperature 97.8 F (36.6 C), temperature source Temporal, weight 175 lb 12.8 oz (79.7 kg).Body mass index is 29.25 kg/m.  General Appearance: Casual  Eye Contact:  Fair  Speech:  Clear and Coherent  Volume:  Normal  Mood:  Euthymic  Affect:  Appropriate  Thought Process:  Goal Directed and Descriptions of Associations: Intact  Orientation:  Full (Time, Place, and Person)  Thought Content: Logical   Suicidal Thoughts:  No  Homicidal Thoughts:  No  Memory:  Immediate;   Fair Recent;   Fair Remote;   Fair  Judgement:  Fair  Insight:  Fair  Psychomotor Activity:  Normal  Concentration:  Concentration: Fair and Attention Span: Fair  Recall:  [employer] of Knowledge: Fair  Language: Fair  Akathisia:  No  Handed:  Right  AIMS (if indicated): done  Assets:  Communication Skills Desire for Improvement Housing Intimacy Social Support Transportation  ADL's:  Intact  Cognition: WNL  Sleep:  Fair   Screenings: GAD-7     Flowsheet Row Office Visit from 03/05/2022 in [facility] Office Visit from 02/12/2022 in [facility] Visit from 11/13/2021 in [facility] Visit from 10/24/2021 in [facility] Office Visit from 09/14/2021 in [facility]  Total GAD-7 Score 10 8 9 21 13       PHQ2-9    [place] Visit from 03/05/2022 in [facility] Office Visit from 02/12/2022 in [facility] Video Visit from 12/05/2021 in [facility] Office Visit from 11/13/2021 in [facility] Visit from 10/24/2021 in [facility]  PHQ-2 Total Score 3 1 0 2 2  PHQ-9 Total Score 13 4 -- 8 [place] Visit from 03/05/2022 in [facility] Office Visit from 10/24/2021 in [facility] Video Visit from 10/11/2020 in [facility] Error: Question 6 not populated Low Risk No Risk        Assessment and Plan: Sayuri Rhames is a 56 year old African-American female who has a history of bipolar disorder, PTSD, panic attacks, cannabis use disorder, congestive heart failure, liver disease, chronic renal failure, diabetes mellitus, multiple other medical problems was evaluated in office today.  Patient is currently improving and is compliant on  her medications.  Patient however his cannabis abuse, provided counseling.  Discussed plan as noted below.  Plan Bipolar disorder type I mixed in remission Continue lamotrigine 25 mg p.o. daily Mirtazapine 7.5 mg p.o. nightly  Chronic PTSD-improving Patient was referred for CBT-noncompliant  Panic disorder-improving Mirtazapine 7.5 mg p.o. nightly  Cannabis use disorder-unstable Provided counseling. Reviewed urine drug screen dated 02/06/2022-positive for  cannabis.  Follow-up in clinic in 2 to 3 months or sooner if needed.  This note was generated in part or whole with voice recognition software. Voice recognition is usually quite accurate but there are transcription errors that can and very often do occur. I apologize for any typographical errors that were not detected and corrected.    [name], MD 03/05/2022, 11:04 AM

## 2022-03-11 ENCOUNTER — Other Ambulatory Visit: Payer: Self-pay | Admitting: Cardiovascular Disease

## 2022-03-13 NOTE — Telephone Encounter (Signed)
Spoke with Victorino Dike from [employer].   Victorino Dike is requesting that we appeal the [employer] denial. States that they just need [place] to do first level, and if we get a letter stating that it's going to second level to a third party we can fax them that letter and they will get patient sent over to the [employer].   Victorino Dike also requesting that we fax them the denial letter from [facility] to [phone].

## 2022-04-10 NOTE — Progress Notes (Unsigned)
Cardiology Office Note  Date:  04/11/2022   ID:  Bonnie Ochoa, DOB [birth year], MRN [id number]  PCP:  Valerie Roys, DO   Chief Complaint  Patient presents with   2 month follow up     "Doing well." Patient c/o chest pain at times. Medications reviewed by the patient verbally.    HPI:  Bonnie Ochoa is a 56 yo woman with PMH Medical and appt noncompliance, Substance abuse-chronic /marijuana bipolar,  anxiety diabetes, hypertension,  asthma,  Obstructive sleep apnea, restless leg syndrome Nonischemic cardiomyopathy,  history of ICD,  Cath 2019: no CAD ejection fraction 25%, 05/2018 EF  25 to 30% on 12/21 anemia Previous alcohol problem ascites, cirrhosis, History of paracentesis Previously on pain medication at home, oxycodone She presents today for follow-up of her chronic systolic CHF  Last seen by myself in clinic June 2023  In follow-up today reports feeling well Reports compliance with her medications, family does a pillbox No family presenting with her today Denies leg edema, no abdominal distention, weight stable 176, same weight as last year  Denies significant shortness of breath on exertion  Previous lab work reviewed, digoxin 1.4 in April 2023 on digoxin 0.125 daily On prior clinic visit we ordered verquvo but this was denied by Medicare  Prior records reviewed Echocardiogram March 2023 Ejection fraction 30 to 35% moderately dilated Prior EF 2021 was 25 to 30%  Medication list as below - Continue spironolactone to 25 mg daily.  - Continue carvedilol 12.5 mg twice a day. - Continue digoxin 0.125 mg daily.  - Continue Jardiance 25 daily (she is not sure if she is taking) - Change Lasix 20 mg to M/W/F, OK to take extra if needed - Continue Entresto 97/103 bid mg  Reports feeling well overall A1c greater than 11  ECHO 07/2020 Ef 25 to 30-%  EKG personally reviewed by myself on todays visit Normal sinus rhythm rate 78 bpm nonspecific ST  abnormality  Other past medical history reviewed hospitalization October 25, 2018 for renal failure CR 2.15 Felt to be secondary to dehydration and improved with IV fluids She was continued on Lasix at discharge   Other past medical history reviewed Previous hospitalization November 2018 for hyperglycemia nonketotic coma She had acute renal failure and hyponatremia  Hospital admission 09/10/16 numerous sx on arrival to ER chest pain, shortness of breath, abdominal pain, dysuria, urinary frequency, syncope 2 today with lightheadedness, nausea and vomiting, and diarrhea , sob TBili 2.4, had paracentesis, diuresis D/c on lasix 60 BID   Hospital admission 09/17/16: chest pain, ABD pain fevers, chills, chest pain, shortness of breath, vomiting and diarrhea. "ran out of meds" HTN, acute on chronic systolic CHF   Seen in the emergency room 05/19/2016 for abdominal pain, chest pain BNP in the hospital 3700  acute on chronic CHF, Had 20 L diuresis   CT scan consistent with moderate abdominal ascites, fatty liver Aortic atherosclerosis   PMH:   has a past medical history of Abdominal pain, ADHD, AICD (automatic cardioverter/defibrillator) present, Anemia, Arthritis, Asthma, Bipolar 1 disorder ([place]), Chest pain, CHF (congestive heart failure) ([place]), Cirrhosis of liver ([place]), Coronary artery disease, Depression, Diabetes mellitus without complication ([place]), Diverticulitis, Dysrhythmia, Heart murmur, HFrEF (heart failure with reduced ejection fraction) ([place]), Hypertension, Hypothyroidism, IBS (irritable bowel syndrome), Insomnia, Migraines, NICM (nonischemic cardiomyopathy) ([place]), OSA (obstructive sleep apnea), PTSD (post-traumatic stress disorder), Restless leg syndrome, and Vertigo.  PSH:    Past Surgical History:  Procedure Laterality Date   ABDOMINAL  HYSTERECTOMY     CARDIAC CATHETERIZATION     CATARACT EXTRACTION W/PHACO Right 01/27/2019   Procedure: CATARACT EXTRACTION PHACO AND INTRAOCULAR  LENS PLACEMENT ([place])  VISION BLUE RIGHT DIABETES;  Surgeon: Birder Robson, MD;  Location: [facility];  Service: Ophthalmology;  Laterality: Right;  Diabetes - insulin sleep apnea   CATARACT EXTRACTION W/PHACO Left 03/08/2020   Procedure: CATARACT EXTRACTION PHACO AND INTRAOCULAR LENS PLACEMENT (I[facility]) LEFT DIABETIC 10.28  00:57.8;  Surgeon: Birder Robson, MD;  Location: [facility];  Service: Ophthalmology;  Laterality: Left;   COLONOSCOPY WITH PROPOFOL N/A 03/11/2019   Procedure: COLONOSCOPY WITH PROPOFOL;  Surgeon: Virgel Manifold, MD;  Location: [facility];  Service: Endoscopy;  Laterality: N/A;   CORONARY ARTERY BYPASS GRAFT     Pt denies   ESOPHAGOGASTRODUODENOSCOPY (EGD) WITH PROPOFOL N/A 03/11/2019   Procedure: ESOPHAGOGASTRODUODENOSCOPY (EGD) WITH PROPOFOL;  Surgeon: Virgel Manifold, MD;  Location: [facility];  Service: Endoscopy;  Laterality: N/A;   INSERT / REPLACE / REMOVE PACEMAKER     ICD   INSERTION OF ICD  2016   St Jude Single chamber ICD   RIGHT/LEFT HEART CATH AND CORONARY ANGIOGRAPHY N/A 06/09/2018   Procedure: RIGHT/LEFT HEART CATH AND CORONARY ANGIOGRAPHY;  Surgeon: Wellington Hampshire, MD;  Location: [facility];  Service: Cardiovascular;  Laterality: N/A;   TONSILLECTOMY     TUBAL LIGATION  1980    Current Outpatient Medications  Medication Sig Dispense Refill   blood glucose meter kit and supplies KIT Dispense based on patient and insurance preference. Use up to four times daily as directed. (FOR ICD-9 250.00, 250.01). 1 each 0   carvedilol (COREG) 12.5 MG tablet TAKE 1 TABLET (12.5MG TOTAL) BY MOUTH TWICE A DAY WITH MEALS 180 tablet 0   cyclobenzaprine (FLEXERIL) 5 MG tablet TAKE 1 TABLET BY MOUTH THREE TIMES A DAY AS NEEDED FOR MUSCLE SPASMS 60 tablet 1   digoxin (LANOXIN) 0.125 MG tablet TAKE 1 TABLET BY MOUTH EVERY DAY 90 tablet 0   empagliflozin (JARDIANCE) 25 MG TABS tablet Take 1 tablet (25 mg total) by mouth daily.  90 tablet 1   famotidine (PEPCID) 40 MG tablet TAKE 1 TABLET BY MOUTH EVERY DAY 90 tablet 2   furosemide (LASIX) 20 MG tablet TAKE 1 TABLET (20 MG TOTAL) BY MOUTH AS DIRECTED. TAKE 20MG (1 TABLET) MONDAY, WEDNESDAY, FRIDAY. 36 tablet 0   gabapentin (NEURONTIN) 100 MG capsule Take 1 capsule (100 mg total) by mouth at bedtime. 30 capsule 3   insulin glargine (LANTUS SOLOSTAR) 100 UNIT/ML Solostar Pen Inject 45 Units into the skin daily at 10 pm. 45 mL 1   Insulin Pen Needle 32G X 6 MM MISC 1 each by Does not apply route daily. 100 each 12   lamoTRIgine (LAMICTAL) 25 MG tablet Take 1 tablet (25 mg total) by mouth daily. 90 tablet 0   metoCLOPramide (REGLAN) 10 MG tablet Take 10 mg by mouth 4 (four) times daily.     mirtazapine (REMERON) 7.5 MG tablet Take 1 tablet (7.5 mg total) by mouth at bedtime. 90 tablet 0   pantoprazole (PROTONIX) 40 MG tablet TAKE 1 TABLET BY MOUTH EVERY DAY 90 tablet 1   potassium chloride SA (KLOR-CON M20) 20 MEQ tablet TAKE 2 TABLETS BY MOUTH TWICE A DAY 120 tablet 3   rosuvastatin (CRESTOR) 40 MG tablet TAKE 1 TABLET BY MOUTH EVERY DAY 90 tablet 0   sacubitril-valsartan (ENTRESTO) 49-51 MG Take 1 tablet by mouth 2 (two)  times daily. 60 tablet 3   Semaglutide, 2 MG/DOSE, 8 MG/3ML SOPN Inject 2 mg as directed once a week. 9 mL 1   spironolactone (ALDACTONE) 25 MG tablet TAKE 1 TABLET (25 MG TOTAL) BY MOUTH DAILY. 30 tablet 3   sucralfate (CARAFATE) 1 g tablet TAKE 1 TABLET (1 G TOTAL) BY MOUTH 4 (FOUR) TIMES DAILY. 360 tablet 3   Vericiguat (VERQUVO) 10 MG TABS Take 10 mg by mouth daily. 30 tablet 3   No current facility-administered medications for this visit.     Allergies:   Levothyroxine   Social History:  The patient  reports that she quit smoking about 38 years ago. Her smoking use included cigarettes. She has never used smokeless tobacco. She reports that she does not currently use alcohol. She reports current drug use. Frequency: 1.00 time per week. Drug:  Marijuana.   Family History:   family history includes Alzheimer's disease in her maternal grandfather; Asthma in her sister and sister; Breast cancer in her paternal aunt; Cancer in her sister; Diabetes in her sister and sister; Heart disease in her mother and sister; Hyperlipidemia in her brother and mother; Hypertension in her mother, sister, and sister.    Review of Systems: Review of Systems  Constitutional:  Positive for weight loss.  HENT: Negative.    Respiratory: Negative.    Cardiovascular: Negative.   Gastrointestinal: Negative.   Musculoskeletal: Negative.   Neurological: Negative.   Psychiatric/Behavioral: Negative.    All other systems reviewed and are negative.   PHYSICAL EXAM: VS:  BP (!) 122/90 (BP Location: Left Arm, Patient Position: Sitting, Cuff Size: Normal)   Pulse 78   Ht 5' 5"  (1.651 m)   Wt 176 lb 6 oz (80 kg)   SpO2 98%   BMI 29.35 kg/m  , BMI Body mass index is 29.35 kg/m.  Constitutional:  oriented to person, place, and time. No distress.  HENT:  Head: Grossly normal Eyes:  no discharge. No scleral icterus.  Neck: No JVD, no carotid bruits  Cardiovascular: Regular rate and rhythm, no murmurs appreciated Pulmonary/Chest: Clear to auscultation bilaterally, no wheezes or rails Abdominal: Soft.  no distension.  no tenderness.  Musculoskeletal: Normal range of motion Neurological:  normal muscle tone. Coordination normal. No atrophy Skin: Skin warm and dry Psychiatric: normal affect, pleasant   Recent Labs: 11/13/2021: ALT 18; BUN 17; Creatinine, Ser 1.44; Hemoglobin 12.4; Platelets 214; Potassium 4.5; Sodium 128; TSH 1.440    Lipid Panel Lab Results  Component Value Date   CHOL 132 11/13/2021   HDL 28 (L) 11/13/2021   LDLCALC 49 11/13/2021   TRIG 361 (H) 11/13/2021      Wt Readings from Last 3 Encounters:  04/11/22 176 lb 6 oz (80 kg)  02/12/22 175 lb 9.6 oz (79.7 kg)  01/17/22 174 lb 2 oz (79 kg)     ASSESSMENT AND PLAN:  Problem  List Items Addressed This Visit       Cardiology Problems   Chronic systolic heart failure ([place]) - Primary (Chronic)   Pulmonary hypertension (HCC)   NICM (nonischemic cardiomyopathy) ([place])   Other Visit Diagnoses     Essential hypertension       ICD (implantable cardioverter-defibrillator) in place       OSA (obstructive sleep apnea)       CKD (chronic kidney disease), stage II       Poorly controlled diabetes mellitus ([place])         Nonischemic cardiomyopathy EF 30 to  35% Sister who does medications does not present today Weight is stable, recommend she decrease digoxin down to every other day Given elevated dig level several months ago She did not qualify for verquvo   Diabetes type 2 with complications Hemoglobin A1c continues to run high Recommend she continue to work closely with Dr. Park Liter  Bipolar 1 disorder/PTSD Followed by PMD Stable  Cirrhosis Followed by GI,  Recommend alcohol cessation Previous reported drinking occasionally ,not every day   Total encounter time more than 30 minutes  Greater than 50% was spent in counseling and coordination of care with the patient    Signed, Esmond Plants, M.D., Ph.D. [place], [place]

## 2022-04-11 ENCOUNTER — Encounter: Payer: Self-pay | Admitting: Cardiovascular Disease

## 2022-04-11 ENCOUNTER — Ambulatory Visit: Payer: Medicare HMO | Attending: Cardiovascular Disease | Admitting: Cardiovascular Disease

## 2022-04-11 VITALS — BP 122/90 | HR 78 | Ht 65.0 in | Wt 176.4 lb

## 2022-04-11 DIAGNOSIS — I428 Other cardiomyopathies: Secondary | ICD-10-CM

## 2022-04-11 DIAGNOSIS — I10 Essential (primary) hypertension: Secondary | ICD-10-CM | POA: Diagnosis not present

## 2022-04-11 DIAGNOSIS — N182 Chronic kidney disease, stage 2 (mild): Secondary | ICD-10-CM | POA: Diagnosis not present

## 2022-04-11 DIAGNOSIS — E1165 Type 2 diabetes mellitus with hyperglycemia: Secondary | ICD-10-CM | POA: Diagnosis not present

## 2022-04-11 DIAGNOSIS — Z95810 Presence of automatic (implantable) cardiac defibrillator: Secondary | ICD-10-CM

## 2022-04-11 DIAGNOSIS — G4733 Obstructive sleep apnea (adult) (pediatric): Secondary | ICD-10-CM

## 2022-04-11 DIAGNOSIS — I272 Pulmonary hypertension, unspecified: Secondary | ICD-10-CM

## 2022-04-11 DIAGNOSIS — I5022 Chronic systolic (congestive) heart failure: Secondary | ICD-10-CM | POA: Diagnosis not present

## 2022-04-11 MED ORDER — DIGOXIN 125 MCG PO TABS: ORAL_TABLET | ORAL | 0 refills | Status: DC

## 2022-04-11 NOTE — Patient Instructions (Addendum)
Medication Instructions:  Please decrease the digoxin down to every other day  If you need a refill on your cardiac medications before your next appointment, please call your pharmacy.   Lab work: No new labs needed  Testing/Procedures: No new testing needed  Follow-Up: At [facility], you and your health needs are our priority.  As part of our continuing mission to provide you with exceptional heart care, we have created designated Provider Care Teams.  These Care Teams include your primary Cardiologist (physician) and Advanced Practice Providers (APPs -  Physician Assistants and Nurse Practitioners) who all work together to provide you with the care you need, when you need it.  You will need a follow up appointment in 6 months  Providers on your designated Care Team:   Nicolasa Ducking, NP Eula Listen, PA-C Cadence Fransico [place], [place]  COVID-19 Vaccine Information can be found at: [email or web address] For questions related to vaccine distribution or appointments, please email [email or web address] or call 9785880272.

## 2022-04-12 NOTE — Addendum Note (Signed)
Addended by: Festus Aloe on: 04/12/2022 02:08 PM   Modules accepted: Orders

## 2022-05-02 ENCOUNTER — Ambulatory Visit: Payer: Self-pay | Admitting: *Deleted

## 2022-05-02 NOTE — Telephone Encounter (Signed)
[place]. Intern.  Rosuvastatin last done in Feb. 2023.   Was it prescribed after that?  No   It needs to be prescribed again because pt is diabetic between the age 58-75.   It's recommended to be on a statin to reduce the cardiovascular risk per ADA guidelines AHA guidelines.  Pharmacy is recommending this be reordered.    This information was sent to Dr. Park Liter for her consideration.    Reason for Disposition  [1] Pharmacy calling with prescription question AND [2] triager unable to answer question  Answer Assessment - Initial Assessment Questions 1. NAME of MEDICINE: "What medicine(s) are you calling about?"     Rosuvastatin 2. QUESTION: "What is your question?" (e.g., double dose of medicine, side effect)     Pharmacy intern Sana called.  She asked if pt was prescribed rosuvastatin after Feb. 2023.   It does not look like she was. She is recommending it be prescribed because the pt is diabetic and between the ages of 13-75 yrs old.   It is recommended to be on a statin to reduce the cardiovascular risk per the ADA and AHA guidelines.  3. PRESCRIBER: "Who prescribed the medicine?" Reason: if prescribed by specialist, call should be referred to that group.     Dr. Park Liter 4. SYMPTOMS: "Do you have any symptoms?" If Yes, ask: "What symptoms are you having?"  "How bad are the symptoms (e.g., mild, moderate, severe)     N/A 5. PREGNANCY:  "Is there any chance that you are pregnant?" "When was your last menstrual period?"     N/A  Protocols used: Medication Question Call-A-AH

## 2022-05-03 ENCOUNTER — Other Ambulatory Visit: Payer: Self-pay | Admitting: Family Medicine

## 2022-05-03 MED ORDER — ROSUVASTATIN CALCIUM 40 MG PO TABS: 40.0000 mg | ORAL_TABLET | Freq: Every day | ORAL | 1 refills | Status: DC

## 2022-05-05 ENCOUNTER — Other Ambulatory Visit: Payer: Self-pay | Admitting: Cardiovascular Disease

## 2022-05-05 ENCOUNTER — Other Ambulatory Visit: Payer: Self-pay | Admitting: Family Medicine

## 2022-05-07 NOTE — Telephone Encounter (Signed)
Requested Prescriptions  Pending Prescriptions Disp Refills  . LANTUS SOLOSTAR 100 UNIT/ML Solostar Pen [Pharmacy Med Name: LANTUS SOLOSTAR 100 UNIT/ML] 45 mL 0    Sig: INJECT 45 UNITS INTO THE SKIN DAILY AT 10 PM.     Endocrinology:  Diabetes - Insulins Failed - 05/05/2022  4:37 PM      Failed - HBA1C is between 0 and 7.9 and within 180 days    Hemoglobin A1C  Date Value Ref Range Status  02/19/2018 7.8  Final   HB A1C (BAYER DCA - WAIVED)  Date Value Ref Range Status  02/12/2022 10.1 (H) 4.8 - 5.6 % Final    Comment:             Prediabetes: 5.7 - 6.4          Diabetes: >6.4          Glycemic control for adults with diabetes: <7.0          Passed - Valid encounter within last 6 months    Recent Outpatient Visits          2 months ago Type 2 diabetes mellitus with stage 3a chronic kidney disease, with long-term current use of insulin ([place])   [place], Megan P, DO   5 months ago Benign hypertensive renal disease   [facility] [place], Megan P, DO   7 months ago Benign hypertensive renal disease   [facility] [place], Megan P, DO   11 months ago Type 2 diabetes mellitus with stage 3a chronic kidney disease, with long-term current use of insulin ([place])   [place], Megan P, DO   1 year ago Type 2 diabetes mellitus with stage 3a chronic kidney disease, with long-term current use of insulin ([place])   [place], Megan P, DO      Future Appointments            In 1 week Johnson, Barb Merino, DO [employer]   In 5 months Gollan, Kathlene November, MD [facility] [place]

## 2022-05-15 ENCOUNTER — Ambulatory Visit (INDEPENDENT_AMBULATORY_CARE_PROVIDER_SITE_OTHER): Payer: Medicare HMO | Admitting: Family Medicine

## 2022-05-15 ENCOUNTER — Encounter: Payer: Self-pay | Admitting: Family Medicine

## 2022-05-15 VITALS — BP 123/86 | HR 92 | Temp 97.8°F | Wt 180.9 lb

## 2022-05-15 DIAGNOSIS — Z0000 Encounter for general adult medical examination without abnormal findings: Secondary | ICD-10-CM | POA: Diagnosis not present

## 2022-05-15 DIAGNOSIS — E039 Hypothyroidism, unspecified: Secondary | ICD-10-CM | POA: Diagnosis not present

## 2022-05-15 DIAGNOSIS — Z23 Encounter for immunization: Secondary | ICD-10-CM | POA: Diagnosis not present

## 2022-05-15 DIAGNOSIS — N1831 Chronic kidney disease, stage 3a: Secondary | ICD-10-CM | POA: Diagnosis not present

## 2022-05-15 DIAGNOSIS — F3132 Bipolar disorder, current episode depressed, moderate: Secondary | ICD-10-CM | POA: Diagnosis not present

## 2022-05-15 DIAGNOSIS — I5022 Chronic systolic (congestive) heart failure: Secondary | ICD-10-CM

## 2022-05-15 DIAGNOSIS — K7469 Other cirrhosis of liver: Secondary | ICD-10-CM

## 2022-05-15 DIAGNOSIS — I129 Hypertensive chronic kidney disease with stage 1 through stage 4 chronic kidney disease, or unspecified chronic kidney disease: Secondary | ICD-10-CM

## 2022-05-15 DIAGNOSIS — Z794 Long term (current) use of insulin: Secondary | ICD-10-CM

## 2022-05-15 DIAGNOSIS — I428 Other cardiomyopathies: Secondary | ICD-10-CM

## 2022-05-15 DIAGNOSIS — Z1231 Encounter for screening mammogram for malignant neoplasm of breast: Secondary | ICD-10-CM

## 2022-05-15 DIAGNOSIS — E1122 Type 2 diabetes mellitus with diabetic chronic kidney disease: Secondary | ICD-10-CM

## 2022-05-15 LAB — URINALYSIS, ROUTINE W REFLEX MICROSCOPIC
Bilirubin, UA: NEGATIVE
Ketones, UA: NEGATIVE
Leukocytes,UA: NEGATIVE
Nitrite, UA: NEGATIVE
Protein,UA: NEGATIVE
RBC, UA: NEGATIVE
Specific Gravity, UA: 1.01 (ref 1.005–1.030)
Urobilinogen, Ur: 0.2 mg/dL (ref 0.2–1.0)
pH, UA: 5.5 (ref 5.0–7.5)

## 2022-05-15 LAB — BAYER DCA HB A1C WAIVED: HB A1C (BAYER DCA - WAIVED): 9.7 % — ABNORMAL HIGH (ref 4.8–5.6)

## 2022-05-15 LAB — MICROALBUMIN, URINE WAIVED
Creatinine, Urine Waived: 50 mg/dL (ref 10–300)
Microalb, Ur Waived: 10 mg/L (ref 0–19)

## 2022-05-15 MED ORDER — MIRTAZAPINE 7.5 MG PO TABS: 7.5000 mg | ORAL_TABLET | Freq: Every day | ORAL | 0 refills | Status: DC

## 2022-05-15 MED ORDER — EMPAGLIFLOZIN 25 MG PO TABS: 25.0000 mg | ORAL_TABLET | Freq: Every day | ORAL | 1 refills | Status: DC

## 2022-05-15 MED ORDER — GABAPENTIN 100 MG PO CAPS: 100.0000 mg | ORAL_CAPSULE | Freq: Every day | ORAL | 1 refills | Status: DC

## 2022-05-15 MED ORDER — LAMOTRIGINE 25 MG PO TABS: 25.0000 mg | ORAL_TABLET | Freq: Every day | ORAL | 0 refills | Status: DC

## 2022-05-15 MED ORDER — TIRZEPATIDE 10 MG/0.5ML ~~LOC~~ SOAJ: 10.0000 mg | SUBCUTANEOUS | 2 refills | Status: DC

## 2022-05-15 NOTE — Assessment & Plan Note (Signed)
Rechecking labs today. Await results. Treat as needed.  °

## 2022-05-15 NOTE — Progress Notes (Signed)
BP 123/86   Pulse 92   Temp 97.8 F (36.6 C)   Wt 180 lb 14.4 oz (82.1 kg)   SpO2 98%   BMI 30.10 kg/m    Subjective:    Patient ID: Bonnie Ochoa, female    DOB: 1966-03-20, 56 y.o.   MRN: [id number]  HPI: Bonnie Ochoa is a 56 y.o. female presenting on 05/15/2022 for comprehensive medical examination. Current medical complaints include:  DIABETES Hypoglycemic episodes:no Polydipsia/polyuria: no Visual disturbance: no Chest pain: no Paresthesias: no Glucose Monitoring: no  Accucheck frequency: Not Checking Taking Insulin?: yes Blood Pressure Monitoring: rarely Retinal Examination: Up to Date Foot Exam: Up to Date Diabetic Education: Completed Pneumovax: Up to Date Influenza: Up to Date Aspirin: no  HYPERTENSION / [place] Satisfied with current treatment? yes Duration of hypertension: chronic BP monitoring frequency: not checking BP medication side effects: no Past BP meds: carvedilol, lasix, entresto, spironalactone,  Duration of hyperlipidemia: chronic Cholesterol medication side effects: no Cholesterol supplements: none Past cholesterol medications: crestor Medication compliance: excellent compliance Aspirin: no Recent stressors: no Recurrent headaches: no Visual changes: no Palpitations: no Dyspnea: no Chest pain: no Lower extremity edema: no Dizzy/lightheaded: no  HYPOTHYROIDISM Thyroid control status:controlled Satisfied with current treatment? yes Medication side effects: no Recent dose adjustment:no Fatigue: no Cold intolerance: no Heat intolerance: no Weight gain: no Weight loss: no Constipation: no Diarrhea/loose stools: no Palpitations: no Lower extremity edema: no Anxiety/depressed mood: no  BIPOLAR Mood status: stable Satisfied with current treatment?: yes Symptom severity: mild  Duration of current treatment : chronic Side effects: no Medication compliance: fair compliance Psychotherapy/counseling: no  Depressed mood:  yes Anxious mood: yes Anhedonia: no Significant weight loss or gain: no Insomnia: no  Fatigue: yes Feelings of worthlessness or guilt: no Impaired concentration/indecisiveness: no Suicidal ideations: no Hopelessness: no Crying spells: no    05/15/2022    8:57 AM 03/05/2022    9:18 AM 02/12/2022    9:05 AM 12/05/2021    1:34 PM 11/13/2021   11:19 AM  Depression screen PHQ 2/9  Decreased Interest 2  0  1  Down, Depressed, Hopeless _0 PHQ - 2 Score _1 Altered sleeping _2 Tired, decreased energy _3 Change in appetite 0  0  1  Feeling bad or failure about yourself  _4 Trouble concentrating 3  0  2  Moving slowly or fidgety/restless 0  0  0  Suicidal thoughts 0  0  0  PHQ-9 Score _5 Difficult doing work/chores Somewhat difficult  Not difficult at all  Somewhat difficult     Information is confidential and restricted. Go to Review Flowsheets to unlock data.   She currently lives with: wife and family Menopausal Symptoms: no  Depression Screen done today and results listed below:     05/15/2022    8:57 AM 03/05/2022    9:18 AM 02/12/2022    9:05 AM 12/05/2021    1:34 PM 11/13/2021   11:19 AM  Depression screen PHQ 2/9  Decreased Interest 2  0  1  Down, Depressed, Hopeless _6 PHQ - 2 Score _7 Altered sleeping _8 Tired, decreased energy _9 Change in appetite 0  0  1  Feeling bad or failure about yourself  _0 Trouble concentrating 3  0  2  Moving slowly or fidgety/restless 0  0  0  Suicidal thoughts 0  0  0  PHQ-9 Score _1 Difficult doing work/chores Somewhat difficult  Not difficult at all  Somewhat difficult     Information is confidential and restricted. Go to Review Flowsheets to unlock data.    Past Medical History:  Past Medical History:  Diagnosis Date   Abdominal pain    a. 05/2018 HIDA scan wnl.   ADHD    AICD (automatic cardioverter/defibrillator) present    a. 11/2014 s/p SJM Fortify  Assura, single lead AICD (ser# [id number]).   Anemia    Arthritis    Asthma    Bipolar 1 disorder ([place])    Chest pain    a. Hx of cath in [place] - reportedly nl; b. 04/2018 MV: EF 22%, fixed dist ant septal, apical, and inferoapical defects - ? scar vs. attenuation. No ischemia.   CHF (congestive heart failure) (HCC)    Cirrhosis of liver ([place])    Coronary artery disease    Depression    Diabetes mellitus without complication ([place])    Diverticulitis    Dysrhythmia    Heart murmur    HFrEF (heart failure with reduced ejection fraction) ([place])    a. 06/2017 Echo: EF 20-25%, diff HK. Mildly dil LA; b. 05/2018 Echo: EF 25-30%, diff HK, Gr2 DD. Triv AI. Mod MR. Mildly reduced RV fxn. Mod-Sev TR. PASP 45-52mHg.   Hypertension    Hypothyroidism    IBS (irritable bowel syndrome)    Insomnia    Migraines    NIC[place] ([place]ischemic cardiomyopathy) [place]    a. EF prev 25%; b. 11/2014 s/p SJM Fortify Assura, single lead AICD (ser# [id number]; c. 06/2017 Echo: EF 20-25%; d. 05/2018 Echo: EF 25-30%, diff HK, Gr2 DD; e. 05/2018 Echo: EF 25-30%; f. 05/2018 Cath: Nl cors. LVEDP 229mg, PCWP 3245m. PA 65/40 (52). CO/CI 3.04/1.56; c. 07/2018 CPX: Mod HF limitation.   OSA (obstructive sleep apnea)    CPAP is broken   PTSD (post-traumatic stress disorder)    Restless leg syndrome    Vertigo     Surgical History:  Past Surgical History:  Procedure Laterality Date   ABDOMINAL HYSTERECTOMY     CARDIAC CATHETERIZATION     CATARACT EXTRACTION W/PHACO Right 01/27/2019   Procedure: CATARACT EXTRACTION PHACO AND INTRAOCULAR LENS PLACEMENT (IOC[place]terlingVISION BLUE RIGHT DIABETES;  Surgeon: PorBirder RobsonD;  [facility];  Laterality: Right;  Diabetes - insulin sleep apnea   CATARACT EXTRACTION W/PHACO Left 03/08/2020   Procedure: CATARACT EXTRACTION PHACO AND INTRAOCULAR LENS PLACEMENT (IOC) LEFT DIABETIC 10.28  00:57.8;  Surgeon: PorBirder RobsonD;  [facility];  Laterality: Left;   COLONOSCOPY WITH PROPOFOL N/A 03/11/2019   Procedure: COLONOSCOPY WITH PROPOFOL;  Surgeon: TahVirgel ManifoldD;  Location: [facility];  Service: Endoscopy;  Laterality: N/A;   CORONARY ARTERY BYPASS GRAFT     Pt denies   ESOPHAGOGASTRODUODENOSCOPY (EGD) WITH PROPOFOL N/A 03/11/2019   Procedure: ESOPHAGOGASTRODUODENOSCOPY (EGD) WITH PROPOFOL;  Surgeon: TahVirgel ManifoldD;  Location: [facility]COPY;  Service: Endoscopy;  Laterality: N/A;   INSERT / REPLACE / REMOVE PACEMAKER     ICD   INSERTION OF ICD  2016   St Jude Single chamber ICD   RIGHT/LEFT HEART CATH AND CORONARY ANGIOGRAPHY N/A 06/09/2018  Procedure: RIGHT/LEFT HEART CATH AND CORONARY ANGIOGRAPHY;  Surgeon: Wellington Hampshire, MD;  Location: [facility];  Service: Cardiovascular;  Laterality: N/A;   TONSILLECTOMY     TUBAL LIGATION  1980    Medications:  Current Outpatient Medications on File Prior to Visit  Medication Sig   blood glucose meter kit and supplies KIT Dispense based on patient and insurance preference. Use up to four times daily as directed. (FOR ICD-9 250.00, 250.01).   carvedilol (COREG) 12.5 MG tablet TAKE 1 TABLET (12.5MG TOTAL) BY MOUTH TWICE A DAY WITH MEALS   cyclobenzaprine (FLEXERIL) 5 MG tablet TAKE 1 TABLET BY MOUTH THREE TIMES A DAY AS NEEDED FOR MUSCLE SPASMS   digoxin (LANOXIN) 0.125 MG tablet TAKE 1 TABLET BY MOUTH EVERY OTHER DAY   famotidine (PEPCID) 40 MG tablet TAKE 1 TABLET BY MOUTH EVERY DAY   furosemide (LASIX) 20 MG tablet TAKE 1 TABLET (20 MG TOTAL) BY MOUTH AS DIRECTED. TAKE 20MG (1 TABLET) MONDAY, WEDNESDAY, FRIDAY.   Insulin Pen Needle 32G X 6 MM MISC 1 each by Does not apply route daily.   LANTUS SOLOSTAR 100 UNIT/ML Solostar Pen INJECT 45 UNITS INTO THE SKIN DAILY AT 10 PM.   metoCLOPramide (REGLAN) 10 MG tablet Take 10 mg by mouth 4 (four) times daily.   pantoprazole (PROTONIX) 40 MG tablet TAKE 1 TABLET BY MOUTH  EVERY DAY   potassium chloride SA (KLOR-CON M20) 20 MEQ tablet TAKE 2 TABLETS BY MOUTH TWICE A DAY   rosuvastatin (CRESTOR) 40 MG tablet Take 1 tablet (40 mg total) by mouth daily.   sacubitril-valsartan (ENTRESTO) 49-51 MG Take 1 tablet by mouth 2 (two) times daily.   spironolactone (ALDACTONE) 25 MG tablet TAKE 1 TABLET (25 MG TOTAL) BY MOUTH DAILY.   sucralfate (CARAFATE) 1 g tablet TAKE 1 TABLET (1 G TOTAL) BY MOUTH 4 (FOUR) TIMES DAILY.   Vericiguat (VERQUVO) 10 MG TABS Take 10 mg by mouth daily.   No current facility-administered medications on file prior to visit.    Allergies:  Allergies  Allergen Reactions   Levothyroxine Rash    Social History:  Social History   Socioeconomic History   Marital status: Married    Spouse name: Not on file   Number of children: 2   Years of education: Not on file   Highest education level: High school graduate  Occupational History    Comment: disabled  Tobacco Use   Smoking status: Former    Types: Cigarettes    Quit date: 1985    Years since quitting: 38.7   Smokeless tobacco: Never   Tobacco comments:    quit over  20 years ago   Vaping Use   Vaping Use: Never used  Substance and Sexual Activity   Alcohol use: Not Currently   Drug use: Yes    Frequency: 1.0 times per week    Types: Marijuana    Comment: occassionally   Sexual activity: Yes    Birth control/protection: Surgical    Comment: one partner  Other Topics Concern   Not on file  Social History Narrative   Volunteers occasionally    Social Determinants of Health   Financial Resource Strain: Medium Risk (09/11/2021)   Overall Financial Resource Strain (CARDIA)    Difficulty of Paying Living Expenses: Somewhat hard  Food Insecurity: No Food Insecurity (09/11/2021)   Hunger Vital Sign    Worried About Running Out of Food in the Last Year: Never true  Ran Out of Food in the Last Year: Never true  Transportation Needs: No Transportation Needs (09/11/2021)    PRAPARE - Hydrologist (Medical): No    Lack of Transportation (Non-Medical): No  Physical Activity: Inactive (09/11/2021)   Exercise Vital Sign    Days of Exercise per Week: 0 days    Minutes of Exercise per Session: 0 min  Stress: Stress Concern Present (09/11/2021)   [place]    Feeling of Stress : To some extent  Social Connections: Socially Isolated (09/11/2021)   Social Connection and Isolation Panel [NHANES]    Frequency of Communication with Friends and Family: Once a week    Frequency of Social Gatherings with Friends and Family: Once a week    Attends Religious Services: Never    Marine scientist or Organizations: No    Attends Archivist Meetings: Never    Marital Status: Married  Human resources officer Violence: Not At Risk (09/11/2021)   Humiliation, Afraid, Rape, and Kick questionnaire    Fear of Current or Ex-Partner: No    Emotionally Abused: No    Physically Abused: No    Sexually Abused: No   Social History   Tobacco Use  Smoking Status Former   Types: Cigarettes   Quit date: 1985   Years since quitting: 38.7  Smokeless Tobacco Never  Tobacco Comments   quit over  20 years ago    Social History   Substance and Sexual Activity  Alcohol Use Not Currently    Family History:  Family History  Problem Relation Age of Onset   Hypertension Mother    Hyperlipidemia Mother    Heart disease Mother    Hypertension Sister    Asthma Sister    Heart disease Sister    Diabetes Sister    Cancer Sister    Alzheimer's disease Maternal Grandfather    Hyperlipidemia Brother    Asthma Sister    Hypertension Sister    Diabetes Sister    Breast cancer Paternal Aunt     Past medical history, surgical history, medications, allergies, family history and social history reviewed with patient today and changes made to appropriate areas of the chart.   Review of  Systems  Constitutional: Negative.   Eyes: Negative.   Respiratory:  Positive for cough. Negative for hemoptysis, sputum production, shortness of breath and wheezing.   Cardiovascular:  Positive for chest pain. Negative for palpitations, orthopnea, claudication, leg swelling and PND.  Gastrointestinal: Negative.   Genitourinary: Negative.   Musculoskeletal: Negative.   Skin: Negative.   Neurological: Negative.   Endo/Heme/Allergies: Negative.   Psychiatric/Behavioral: Negative.     All other ROS negative except what is listed above and in the HPI.      Objective:    BP 123/86   Pulse 92   Temp 97.8 F (36.6 C)   Wt 180 lb 14.4 oz (82.1 kg)   SpO2 98%   BMI 30.10 kg/m   Wt Readings from Last 3 Encounters:  05/15/22 180 lb 14.4 oz (82.1 kg)  04/11/22 176 lb 6 oz (80 kg)  02/12/22 175 lb 9.6 oz (79.7 kg)    Physical Exam Vitals and nursing note reviewed.  Constitutional:      General: She is not in acute distress.    Appearance: Normal appearance. She is obese. She is not ill-appearing, toxic-appearing or diaphoretic.  HENT:     Head: Normocephalic and  atraumatic.     Right Ear: Tympanic membrane, ear canal and external ear normal. There is no impacted cerumen.     Left Ear: Tympanic membrane, ear canal and external ear normal. There is no impacted cerumen.     Nose: Nose normal. No congestion or rhinorrhea.     Mouth/Throat:     Mouth: Mucous membranes are moist.     Pharynx: Oropharynx is clear. No oropharyngeal exudate or posterior oropharyngeal erythema.  Eyes:     General: No scleral icterus.       Right eye: No discharge.        Left eye: No discharge.     Extraocular Movements: Extraocular movements intact.     Conjunctiva/sclera: Conjunctivae normal.     Pupils: Pupils are equal, round, and reactive to light.  Neck:     Vascular: No carotid bruit.  Cardiovascular:     Rate and Rhythm: Normal rate and regular rhythm.     Pulses: Normal pulses.     Heart  sounds: No murmur heard.    No friction rub. No gallop.  Pulmonary:     Effort: Pulmonary effort is normal. No respiratory distress.     Breath sounds: Normal breath sounds. No stridor. No wheezing, rhonchi or rales.  Chest:     Chest wall: No tenderness.  Abdominal:     General: Abdomen is flat. Bowel sounds are normal. There is no distension.     Palpations: Abdomen is soft. There is no mass.     Tenderness: There is no abdominal tenderness. There is no right CVA tenderness, left CVA tenderness, guarding or rebound.     Hernia: No hernia is present.  Genitourinary:    Comments: Breast and pelvic exams deferred with shared decision making Musculoskeletal:        General: No swelling, tenderness, deformity or signs of injury.     Cervical back: Normal range of motion and neck supple. No rigidity. No muscular tenderness.     Right lower leg: No edema.     Left lower leg: No edema.  Lymphadenopathy:     Cervical: No cervical adenopathy.  Skin:    General: Skin is warm and dry.     Capillary Refill: Capillary refill takes less than 2 seconds.     Coloration: Skin is not jaundiced or pale.     Findings: No bruising, erythema, lesion or rash.  Neurological:     General: No focal deficit present.     Mental Status: She is alert and oriented to person, place, and time. Mental status is at baseline.     Cranial Nerves: No cranial nerve deficit.     Sensory: No sensory deficit.     Motor: No weakness.     Coordination: Coordination normal.     Gait: Gait normal.     Deep Tendon Reflexes: Reflexes normal.  Psychiatric:        Mood and Affect: Mood normal.        Behavior: Behavior normal.        Thought Content: Thought content normal.        Judgment: Judgment normal.     Results for orders placed or performed in visit on 05/15/22  Urinalysis, Routine w reflex microscopic  Result Value Ref Range   Specific Gravity, UA 1.010 1.005 - 1.030   pH, UA 5.5 5.0 - 7.5   Color, UA  Yellow Yellow   Appearance Ur Clear Clear   Leukocytes,UA Negative Negative  Protein,UA Negative Negative/Trace   Glucose, UA 2+ (A) Negative   Ketones, UA Negative Negative   RBC, UA Negative Negative   Bilirubin, UA Negative Negative   Urobilinogen, Ur 0.2 0.2 - 1.0 mg/dL   Nitrite, UA Negative Negative  Microalbumin, Urine Waived  Result Value Ref Range   Microalb, Ur Waived 10 0 - 19 mg/L   Creatinine, Urine Waived 50 10 - 300 mg/dL   Microalb/Creat Ratio 30-300 (H) <30 mg/g  Bayer DCA Hb A1c Waived  Result Value Ref Range   HB A1C (BAYER DCA - WAIVED) 9.7 (H) 4.8 - 5.6 %      Assessment & Plan:   Problem List Items Addressed This Visit       Cardiovascular and Mediastinum   Chronic systolic heart failure (HCC) (Chronic)    Continue to follow with cardiology. Call with any concerns. Continue to monitor.       NICM (nonischemic cardiomyopathy) ([place])    Continue to follow with cardiology. Call with any concerns. Continue to monitor.         Digestive   Cirrhosis ([place])    Continue to follow with GI. Labs drawn today. Await results. Treat as needed.       Relevant Orders   CBC with Differential/Platelet   Comprehensive metabolic panel     Endocrine   DM (diabetes mellitus), type 2 with renal complications (HCC)    Improved with A1c of 9.7, but still not at goal. Will change from ozempic to [facility]. She will finish what she has for ozempic at home and then change to [facility]. She is aware not to take the 2 together      Relevant Medications   empagliflozin (JARDIANCE) 25 MG TABS tablet   tirzepatide (MOUNJARO) 10 MG/0.5ML Pen   Other Relevant Orders   CBC with Differential/Platelet   Comprehensive metabolic panel   Lipid Panel w/o Chol/HDL Ratio   Bayer DCA Hb A1c Waived (Completed)   Hypothyroid    Rechecking labs today. Await results. Treat as needed.       Relevant Orders   CBC with Differential/Platelet   Comprehensive metabolic panel   TSH      Genitourinary   Benign hypertensive renal disease    Under good control on current regimen. Continue current regimen. Continue to monitor. Call with any concerns. Refills given. Labs drawn today.       Relevant Orders   CBC with Differential/Platelet   Comprehensive metabolic panel   Urinalysis, Routine w reflex microscopic (Completed)   Microalbumin, Urine Waived (Completed)     Other   Bipolar disorder, current episode depressed, moderate ([place])    Has not been seeing psychiatry. Will refill meds for now and get her into [facility]      Relevant Medications   mirtazapine (REMERON) 7.5 MG tablet   lamoTRIgine (LAMICTAL) 25 MG tablet   Other Relevant Orders   Ambulatory referral to Psychiatry   Other Visit Diagnoses     Routine general medical examination at a health care facility    -  Primary   Vaccines up to date. Screening labs checked today. Pap N/A. Mammo ordered. Colonoscopy up to date. Continue diet and exercise. Call with any concerns.    Encounter for screening mammogram for malignant neoplasm of breast       Mammogram ordered today.   Relevant Orders   MM 3D SCREEN BREAST BILATERAL   Need for influenza vaccination       Relevant  Orders   Flu Vaccine QUAD 6+ mos PF IM (Fluarix Quad PF) (Completed)        Follow up plan: Return in about 3 months (around 08/15/2022).   LABORATORY TESTING:  - Pap smear: not applicable  IMMUNIZATIONS:   - Tdap: Tetanus vaccination status reviewed: last tetanus booster within 10 years. - Influenza: Administered today - Pneumovax: Up to date - Prevnar: Not applicable - COVID: Up to date - HPV: Not applicable - Shingrix vaccine: Refused  SCREENING: -Mammogram: Ordered today  - Colonoscopy: Up to date  - Bone Density: Not applicable   PATIENT COUNSELING:   Advised to take 1 mg of folate supplement per day if capable of pregnancy.   Sexuality: Discussed sexually transmitted diseases, partner selection, use of  condoms, avoidance of unintended pregnancy  and contraceptive alternatives.   Advised to avoid cigarette smoking.  I discussed with the patient that most people either abstain from alcohol or drink within safe limits (<=14/week and <=4 drinks/occasion for males, <=7/weeks and <= 3 drinks/occasion for females) and that the risk for alcohol disorders and other health effects rises proportionally with the number of drinks per week and how often a drinker exceeds daily limits.  Discussed cessation/primary prevention of drug use and availability of treatment for abuse.   Diet: Encouraged to adjust caloric intake to maintain  or achieve ideal body weight, to reduce intake of dietary saturated fat and total fat, to limit sodium intake by avoiding high sodium foods and not adding table salt, and to maintain adequate dietary potassium and calcium preferably from fresh fruits, vegetables, and low-fat dairy products.    stressed the importance of regular exercise  Injury prevention: Discussed safety belts, safety helmets, smoke detector, smoking near bedding or upholstery.   Dental health: Discussed importance of regular tooth brushing, flossing, and dental visits.    NEXT PREVENTATIVE PHYSICAL DUE IN 1 YEAR. Return in about 3 months (around 08/15/2022).

## 2022-05-15 NOTE — Assessment & Plan Note (Signed)
Continue to follow with cardiology. Call with any concerns. Continue to monitor.  

## 2022-05-15 NOTE — Assessment & Plan Note (Signed)
Has not been seeing psychiatry. Will refill meds for now and get her into [facility]

## 2022-05-15 NOTE — Patient Instructions (Signed)
[facility]  Address: [address] [place], [place] [address] Phone: [phone] 06/12/22 10:40AM

## 2022-05-15 NOTE — Assessment & Plan Note (Signed)
Continue to follow with GI. Labs drawn today. Await results. Treat as needed.

## 2022-05-15 NOTE — Assessment & Plan Note (Signed)
Improved with A1c of 9.7, but still not at goal. Will change from ozempic to [facility]. She will finish what she has for ozempic at home and then change to [facility]. She is aware not to take the 2 together

## 2022-05-15 NOTE — Assessment & Plan Note (Signed)
Under good control on current regimen. Continue current regimen. Continue to monitor. Call with any concerns. Refills given. Labs drawn today.   

## 2022-05-16 LAB — COMPREHENSIVE METABOLIC PANEL
ALT: 11 IU/L (ref 0–32)
AST: 10 IU/L (ref 0–40)
Albumin/Globulin Ratio: 1.1 — ABNORMAL LOW (ref 1.2–2.2)
Albumin: 4.2 g/dL (ref 3.8–4.9)
Alkaline Phosphatase: 94 IU/L (ref 44–121)
BUN/Creatinine Ratio: 21 (ref 9–23)
BUN: 22 mg/dL (ref 6–24)
Bilirubin Total: 0.4 mg/dL (ref 0.0–1.2)
CO2: 18 mmol/L — ABNORMAL LOW (ref 20–29)
Calcium: 9.7 mg/dL (ref 8.7–10.2)
Chloride: 96 mmol/L (ref 96–106)
Creatinine, Ser: 1.07 mg/dL — ABNORMAL HIGH (ref 0.57–1.00)
Globulin, Total: 4 g/dL (ref 1.5–4.5)
Glucose: 457 mg/dL — ABNORMAL HIGH (ref 70–99)
Potassium: 4.4 mmol/L (ref 3.5–5.2)
Sodium: 138 mmol/L (ref 134–144)
Total Protein: 8.2 g/dL (ref 6.0–8.5)
eGFR: 60 mL/min/{1.73_m2} (ref >59)

## 2022-05-16 LAB — CBC WITH DIFFERENTIAL/PLATELET
Basophils Absolute: 0 10*3/uL (ref 0.0–0.2)
Basos: 1 %
EOS (ABSOLUTE): 0.1 10*3/uL (ref 0.0–0.4)
Eos: 2 %
Hematocrit: 43.5 % (ref 34.0–46.6)
Hemoglobin: 13.5 g/dL (ref 11.1–15.9)
Immature Grans (Abs): 0 10*3/uL (ref 0.0–0.1)
Immature Granulocytes: 0 %
Lymphocytes Absolute: 2.3 10*3/uL (ref 0.7–3.1)
Lymphs: 44 %
MCH: 28.8 pg (ref 26.6–33.0)
MCHC: 31 g/dL — ABNORMAL LOW (ref 31.5–35.7)
MCV: 93 fL (ref 79–97)
Monocytes Absolute: 0.5 10*3/uL (ref 0.1–0.9)
Monocytes: 9 %
Neutrophils Absolute: 2.3 10*3/uL (ref 1.4–7.0)
Neutrophils: 44 %
Platelets: 221 10*3/uL (ref 150–450)
RBC: 4.68 x10E6/uL (ref 3.77–5.28)
RDW: 12.6 % (ref 11.7–15.4)
WBC: 5.2 10*3/uL (ref 3.4–10.8)

## 2022-05-16 LAB — LIPID PANEL W/O CHOL/HDL RATIO
Cholesterol, Total: 273 mg/dL — ABNORMAL HIGH (ref 100–199)
HDL: 34 mg/dL — ABNORMAL LOW (ref >39)
LDL Chol Calc (NIH): 127 mg/dL — ABNORMAL HIGH (ref 0–99)
Triglycerides: 610 mg/dL (ref 0–149)
VLDL Cholesterol Cal: 112 mg/dL — ABNORMAL HIGH (ref 5–40)

## 2022-05-16 LAB — TSH: TSH: 0.394 u[IU]/mL — ABNORMAL LOW (ref 0.450–4.500)

## 2022-05-31 ENCOUNTER — Ambulatory Visit: Payer: Medicare HMO | Admitting: Psychiatry

## 2022-06-08 ENCOUNTER — Other Ambulatory Visit: Payer: Self-pay | Admitting: Cardiovascular Disease

## 2022-08-15 ENCOUNTER — Ambulatory Visit (INDEPENDENT_AMBULATORY_CARE_PROVIDER_SITE_OTHER): Payer: Medicare HMO | Admitting: Family Medicine

## 2022-08-15 ENCOUNTER — Encounter: Payer: Self-pay | Admitting: Family Medicine

## 2022-08-15 VITALS — BP 114/79 | HR 85 | Temp 98.2°F | Ht 65.0 in | Wt 188.0 lb

## 2022-08-15 DIAGNOSIS — Z794 Long term (current) use of insulin: Secondary | ICD-10-CM

## 2022-08-15 DIAGNOSIS — E1122 Type 2 diabetes mellitus with diabetic chronic kidney disease: Secondary | ICD-10-CM | POA: Diagnosis not present

## 2022-08-15 DIAGNOSIS — N1831 Chronic kidney disease, stage 3a: Secondary | ICD-10-CM

## 2022-08-15 MED ORDER — TIRZEPATIDE 12.5 MG/0.5ML ~~LOC~~ SOAJ: 12.5000 mg | SUBCUTANEOUS | 2 refills | Status: DC

## 2022-08-15 NOTE — Progress Notes (Signed)
BP 114/79   Pulse 85   Temp 98.2 F (36.8 C) (Oral)   Ht _0  (1.651 m)   Wt 188 lb (85.3 kg)   SpO2 98%   BMI 31.28 kg/m    Subjective:    Patient ID: Bonnie Ochoa, female    DOB: Feb 04, 1966, 57 y.o.   MRN: [id number]  HPI: Bonnie Ochoa is a 57 y.o. female  No chief complaint on file.  DIABETES- has been on the mounjaro for about 2-3 months Hypoglycemic episodes:no Polydipsia/polyuria: no Visual disturbance: no Chest pain: no Paresthesias: no Glucose Monitoring: yes  Accucheck frequency: Daily  Fasting glucose: 185  Post prandial:  Evening:  Before meals: Taking Insulin?: yes Blood Pressure Monitoring: not checking Retinal Examination: Up to Date Foot Exam: Up to Date Diabetic Education: Completed Pneumovax: Up to Date Influenza: Up to Date Aspirin: yes  Relevant past medical, surgical, family and social history reviewed and updated as indicated. Interim medical history since our last visit reviewed. Allergies and medications reviewed and updated.  Review of Systems  Constitutional: Negative.   Respiratory: Negative.    Cardiovascular: Negative.   Gastrointestinal: Negative.   Musculoskeletal: Negative.   Psychiatric/Behavioral: Negative.      Per HPI unless specifically indicated above     Objective:    BP 114/79   Pulse 85   Temp 98.2 F (36.8 C) (Oral)   Ht _1  (1.651 m)   Wt 188 lb (85.3 kg)   SpO2 98%   BMI 31.28 kg/m   Wt Readings from Last 3 Encounters:  08/15/22 188 lb (85.3 kg)  05/15/22 180 lb 14.4 oz (82.1 kg)  04/11/22 176 lb 6 oz (80 kg)    Physical Exam Vitals and nursing note reviewed.  Constitutional:      General: She is not in acute distress.    Appearance: Normal appearance. She is not ill-appearing, toxic-appearing or diaphoretic.  HENT:     Head: Normocephalic and atraumatic.     Right Ear: External ear normal.     Left Ear: External ear normal.     Nose: Nose normal.     Mouth/Throat:     Mouth: Mucous  membranes are moist.     Pharynx: Oropharynx is clear.  Eyes:     General: No scleral icterus.       Right eye: No discharge.        Left eye: No discharge.     Extraocular Movements: Extraocular movements intact.     Conjunctiva/sclera: Conjunctivae normal.     Pupils: Pupils are equal, round, and reactive to light.  Cardiovascular:     Rate and Rhythm: Normal rate and regular rhythm.     Pulses: Normal pulses.     Heart sounds: Normal heart sounds. No murmur heard.    No friction rub. No gallop.  Pulmonary:     Effort: Pulmonary effort is normal. No respiratory distress.     Breath sounds: Normal breath sounds. No stridor. No wheezing, rhonchi or rales.  Chest:     Chest wall: No tenderness.  Musculoskeletal:        General: Normal range of motion.     Cervical back: Normal range of motion and neck supple.  Skin:    General: Skin is warm and dry.     Capillary Refill: Capillary refill takes less than 2 seconds.     Coloration: Skin is not jaundiced or pale.     Findings: No bruising, erythema, lesion or rash.  Neurological:     General: No focal deficit present.     Mental Status: She is alert and oriented to person, place, and time. Mental status is at baseline.  Psychiatric:        Mood and Affect: Mood normal.        Behavior: Behavior normal.        Thought Content: Thought content normal.        Judgment: Judgment normal.     Results for orders placed or performed in visit on 05/15/22  CBC with Differential/Platelet  Result Value Ref Range   WBC 5.2 3.4 - 10.8 x10E3/uL   RBC 4.68 3.77 - 5.28 x10E6/uL   Hemoglobin 13.5 11.1 - 15.9 g/dL   Hematocrit 43.5 34.0 - 46.6 %   MCV 93 79 - 97 fL   MCH 28.8 26.6 - 33.0 pg   MCHC 31.0 (L) 31.5 - 35.7 g/dL   RDW 12.6 11.7 - 15.4 %   Platelets 221 150 - 450 x10E3/uL   Neutrophils 44 Not Estab. %   Lymphs 44 Not Estab. %   Monocytes 9 Not Estab. %   Eos 2 Not Estab. %   Basos 1 Not Estab. %   Neutrophils Absolute 2.3  1.4 - 7.0 x10E3/uL   Lymphocytes Absolute 2.3 0.7 - 3.1 x10E3/uL   Monocytes Absolute 0.5 0.1 - 0.9 x10E3/uL   EOS (ABSOLUTE) 0.1 0.0 - 0.4 x10E3/uL   Basophils Absolute 0.0 0.0 - 0.2 x10E3/uL   Immature Granulocytes 0 Not Estab. %   Immature Grans (Abs) 0.0 0.0 - 0.1 x10E3/uL  Comprehensive metabolic panel  Result Value Ref Range   Glucose 457 (H) 70 - 99 mg/dL   BUN 22 6 - 24 mg/dL   Creatinine, Ser 1.07 (H) 0.57 - 1.00 mg/dL   eGFR 60 >59 mL/min/1.73   BUN/Creatinine Ratio 21 9 - 23   Sodium 138 134 - 144 mmol/L   Potassium 4.4 3.5 - 5.2 mmol/L   Chloride 96 96 - 106 mmol/L   CO2 18 (L) 20 - 29 mmol/L   Calcium 9.7 8.7 - 10.2 mg/dL   Total Protein 8.2 6.0 - 8.5 g/dL   Albumin 4.2 3.8 - 4.9 g/dL   Globulin, Total 4.0 1.5 - 4.5 g/dL   Albumin/Globulin Ratio 1.1 (L) 1.2 - 2.2   Bilirubin Total 0.4 0.0 - 1.2 mg/dL   Alkaline Phosphatase 94 44 - 121 IU/L   AST 10 0 - 40 IU/L   ALT 11 0 - 32 IU/L  Lipid Panel w/o Chol/HDL Ratio  Result Value Ref Range   Cholesterol, Total 273 (H) 100 - 199 mg/dL   Triglycerides 610 (HH) 0 - 149 mg/dL   HDL 34 (L) >39 mg/dL   VLDL Cholesterol Cal 112 (H) 5 - 40 mg/dL   LDL Chol Calc (NIH) 127 (H) 0 - 99 mg/dL  Urinalysis, Routine w reflex microscopic  Result Value Ref Range   Specific Gravity, UA 1.010 1.005 - 1.030   pH, UA 5.5 5.0 - 7.5   Color, UA Yellow Yellow   Appearance Ur Clear Clear   Leukocytes,UA Negative Negative   Protein,UA Negative Negative/Trace   Glucose, UA 2+ (A) Negative   Ketones, UA Negative Negative   RBC, UA Negative Negative   Bilirubin, UA Negative Negative   Urobilinogen, Ur 0.2 0.2 - 1.0 mg/dL   Nitrite, UA Negative Negative  TSH  Result Value Ref Range   TSH 0.394 (L) 0.450 - 4.500 uIU/mL  Microalbumin,  Urine Waived  Result Value Ref Range   Microalb, Ur Waived 10 0 - 19 mg/L   Creatinine, Urine Waived 50 10 - 300 mg/dL   Microalb/Creat Ratio 30-300 (H) <30 mg/g  Bayer DCA Hb A1c Waived  Result Value  Ref Range   HB A1C (BAYER DCA - WAIVED) 9.7 (H) 4.8 - 5.6 %      Assessment & Plan:   Problem List Items Addressed This Visit       Endocrine   DM (diabetes mellitus), type 2 with renal complications ([place]) - Primary    Unable to get A1c in house today, but sugars still running in the 180s. Will increase her mounjaro to 12.7m and recheck 3 months. Call with any concerns.       Relevant Medications   tirzepatide (MOUNJARO) 12.5 MG/0.5ML Pen   Other Relevant Orders   Bayer DCA Hb A1c Waived   Hgb A1c w/o eAG     Follow up plan: Return in about 3 months (around 11/14/2022).

## 2022-08-15 NOTE — Assessment & Plan Note (Signed)
Unable to get A1c in house today, but sugars still running in the 180s. Will increase her mounjaro to 12.5mg  and recheck 3 months. Call with any concerns.

## 2022-08-16 LAB — HGB A1C W/O EAG: Hgb A1c MFr Bld: 9.7 % — ABNORMAL HIGH (ref 4.8–5.6)

## 2022-08-19 ENCOUNTER — Other Ambulatory Visit: Payer: Self-pay | Admitting: Family Medicine

## 2022-08-20 ENCOUNTER — Other Ambulatory Visit: Payer: Self-pay | Admitting: Cardiovascular Disease

## 2022-08-20 NOTE — Telephone Encounter (Signed)
Requested Prescriptions  Pending Prescriptions Disp Refills   pantoprazole (PROTONIX) 40 MG tablet [Pharmacy Med Name: PANTOPRAZOLE SOD DR 40 MG TAB] 90 tablet 1    Sig: TAKE 1 TABLET BY MOUTH EVERY DAY     Gastroenterology: Proton Pump Inhibitors Passed - 08/19/2022  1:02 AM      Passed - Valid encounter within last 12 months    Recent Outpatient Visits           5 days ago Type 2 diabetes mellitus with stage 3a chronic kidney disease, with long-term current use of insulin ([place])   [place], Megan P, DO   3 months ago Routine general medical examination at a health care facility   [facility], [place] P, DO   6 months ago Type 2 diabetes mellitus with stage 3a chronic kidney disease, with long-term current use of insulin ([place])   [place], [place], DO   9 months ago Benign hypertensive renal disease   [facility] [place], Megan P, DO   11 months ago Benign hypertensive renal disease   [facility] [place], Megan P, DO       Future Appointments             In 1 month Gollan, Kathlene November, MD [place]. [place]   In 2 months Wynetta Emery, Barb Merino, DO [employer], [facility]

## 2022-08-28 ENCOUNTER — Other Ambulatory Visit: Payer: Self-pay | Admitting: Family Medicine

## 2022-08-28 DIAGNOSIS — F3132 Bipolar disorder, current episode depressed, moderate: Secondary | ICD-10-CM

## 2022-08-28 NOTE — Telephone Encounter (Signed)
Requested Prescriptions  Pending Prescriptions Disp Refills   lamoTRIgine (LAMICTAL) 25 MG tablet [Pharmacy Med Name: LAMOTRIGINE 25 MG TABLET] 90 tablet 0    Sig: TAKE 1 TABLET (25 MG TOTAL) BY MOUTH DAILY.     Neurology:  Anticonvulsants - lamotrigine Failed - 08/28/2022  1:27 AM      Failed - Cr in normal range and within 360 days    Creatinine, Ser  Date Value Ref Range Status  05/15/2022 1.07 (H) 0.57 - 1.00 mg/dL Final   Creatinine, Urine  Date Value Ref Range Status  07/02/2017 128 mg/dL Final         Passed - ALT in normal range and within 360 days    ALT  Date Value Ref Range Status  05/15/2022 11 0 - 32 IU/L Final         Passed - AST in normal range and within 360 days    AST  Date Value Ref Range Status  05/15/2022 10 0 - 40 IU/L Final         Passed - Completed PHQ-2 or PHQ-9 in the last 360 days      Passed - Valid encounter within last 12 months    Recent Outpatient Visits           1 week ago Type 2 diabetes mellitus with stage 3a chronic kidney disease, with long-term current use of insulin ([place])   [place], Megan P, DO   3 months ago Routine general medical examination at a health care facility   [facility], [place] P, DO   6 months ago Type 2 diabetes mellitus with stage 3a chronic kidney disease, with long-term current use of insulin ([place])   [place], [place], DO   9 months ago Benign hypertensive renal disease   [facility] Johnson, Megan P, DO   11 months ago Benign hypertensive renal disease   [facility] [place], Megan P, DO       Future Appointments             In 1 month Gollan, Kathlene November, MD [place]. [place]   In 2 months Wynetta Emery, Barb Merino, DO [employer], [facility]

## 2022-08-31 ENCOUNTER — Encounter: Payer: Self-pay | Admitting: Emergency Medicine

## 2022-08-31 ENCOUNTER — Emergency Department
Admission: EM | Admit: 2022-08-31 | Discharge: 2022-08-31 | Disposition: A | Discharge status: Home / Self Care | Payer: Medicare HMO | Attending: Emergency Medicine | Admitting: Emergency Medicine

## 2022-08-31 ENCOUNTER — Other Ambulatory Visit: Payer: Self-pay

## 2022-08-31 ENCOUNTER — Emergency Department: Payer: Medicare HMO

## 2022-08-31 DIAGNOSIS — R079 Chest pain, unspecified: Secondary | ICD-10-CM | POA: Diagnosis not present

## 2022-08-31 DIAGNOSIS — I509 Heart failure, unspecified: Secondary | ICD-10-CM | POA: Insufficient documentation

## 2022-08-31 DIAGNOSIS — R0789 Other chest pain: Secondary | ICD-10-CM | POA: Diagnosis not present

## 2022-08-31 LAB — COMPREHENSIVE METABOLIC PANEL
ALT: 20 U/L (ref 0–44)
AST: 17 U/L (ref 15–41)
Albumin: 3.7 g/dL (ref 3.5–5.0)
Alkaline Phosphatase: 81 U/L (ref 38–126)
Anion gap: 7 (ref 5–15)
BUN: 23 mg/dL — ABNORMAL HIGH (ref 6–20)
CO2: 23 mmol/L (ref 22–32)
Calcium: 8.9 mg/dL (ref 8.9–10.3)
Chloride: 104 mmol/L (ref 98–111)
Creatinine, Ser: 1.32 mg/dL — ABNORMAL HIGH (ref 0.44–1.00)
GFR, Estimated: 47 mL/min — ABNORMAL LOW (ref >60)
Glucose, Bld: 359 mg/dL — ABNORMAL HIGH (ref 70–99)
Potassium: 3.7 mmol/L (ref 3.5–5.1)
Sodium: 134 mmol/L — ABNORMAL LOW (ref 135–145)
Total Bilirubin: 0.9 mg/dL (ref 0.3–1.2)
Total Protein: 8 g/dL (ref 6.5–8.1)

## 2022-08-31 LAB — CBC
HCT: 36.7 % (ref 36.0–46.0)
Hemoglobin: 11.9 g/dL — ABNORMAL LOW (ref 12.0–15.0)
MCH: 29.5 pg (ref 26.0–34.0)
MCHC: 32.4 g/dL (ref 30.0–36.0)
MCV: 91.1 fL (ref 80.0–100.0)
Platelets: 171 10*3/uL (ref 150–400)
RBC: 4.03 MIL/uL (ref 3.87–5.11)
RDW: 12.8 % (ref 11.5–15.5)
WBC: 5.8 10*3/uL (ref 4.0–10.5)
nRBC: 0 % (ref 0.0–0.2)

## 2022-08-31 LAB — D-DIMER, QUANTITATIVE: D-Dimer, Quant: 0.31 ug/mL-FEU (ref 0.00–0.50)

## 2022-08-31 LAB — LIPASE, BLOOD: Lipase: 54 U/L — ABNORMAL HIGH (ref 11–51)

## 2022-08-31 LAB — TROPONIN I (HIGH SENSITIVITY)
Troponin I (High Sensitivity): 19 ng/L — ABNORMAL HIGH (ref <18)
Troponin I (High Sensitivity): 18 ng/L — ABNORMAL HIGH (ref <18)

## 2022-08-31 MED ORDER — SODIUM CHLORIDE 0.9 % IV BOLUS
500.0000 mL | Freq: Once | INTRAVENOUS | Status: AC
  Administered 2022-08-31: 500 mL via INTRAVENOUS

## 2022-08-31 MED ORDER — ASPIRIN 81 MG PO CHEW
324.0000 mg | CHEWABLE_TABLET | Freq: Once | ORAL | Status: AC
  Administered 2022-08-31: 324 mg via ORAL
  Filled 2022-08-31: qty 4

## 2022-08-31 NOTE — Discharge Instructions (Signed)
Call Dr Donivan Scull office today to schedule a follow up appointment.   Thank you for choosing [place] for your health care today!  Please see your primary doctor this week for a follow up appointment.   Sometimes, in the early stages of certain disease courses it is difficult to detect in the emergency department evaluation -- so, it is important that you continue to monitor your symptoms and call your doctor right away or return to the emergency department if you develop any new or worsening symptoms.  Please go to the following website to schedule new (and existing) patient appointments:   [email or web address]  If you do not have a primary doctor try calling the following clinics to establish care:  If you have insurance:  [facility] [phone] [place] [address]   [facility]  [phone] [place] [place] [address]   If you do not have insurance:  [facility]  [phone] [address] [place] [address]   The following is another list of primary care offices in the area who are accepting new patients at this time.  Please reach out to one of them directly and let them know you would like to schedule an appointment to follow up on an Emergency Department visit, and/or to establish a new primary care provider (PCP).  There are likely other primary care clinics in the are who are accepting new patients, but this is an excellent place to start:  [place] physician: Dr Lavon Paganini [address] [place], [place] [address] [phone]  [facility] Lead Physician: Dr Steele Sizer [address], [place], [place] [address] [phone]  [place] Physician: Dr Park Liter [address] [place], [place] [address] [phone]  [facility] Lead Physician: Dr Dewaine Oats [place], [place], [place] [address] [phone]  [place] [facility] [place] Physician: Dr Halina Maidens [address] [place],  [address] [phone]   It was my pleasure to care for you today.   [place] Brunette Jacelyn Grip, MD

## 2022-08-31 NOTE — ED Provider Notes (Signed)
[facility] Provider Note    Event Date/Time   First MD Initiated Contact with Patient 08/31/22 0602     (approximate)   History   No chief complaint on file.   HPI  Bonnie Ochoa is a 57 y.o. female whose medical history is most notable for CHF with an ejection fraction of about 30 to 35%.  She has no history of ACS/MI.  She presents for evaluation of acute onset chest pain around 11 PM last night.  She was watching TV with her wife when the chest pain began.  She said it is worse when she takes deep breaths or moves in certain ways.  The pain is sharp and stabbing in the upper and middle part of her chest.  Nothing in particular seems to make it better.  She tried taking nitroglycerin which she has been prescribed in the past and it did not help.  She has had no difficulty breathing, no nausea/vomiting, no headache, no abdominal pain.  No history of blood clots in the legs of the lungs, no recent surgeries or immobilizations.     Physical Exam   Triage Vital Signs: ED Triage Vitals  Enc Vitals Group     BP 08/31/22 0602 123/84     Pulse Rate 08/31/22 0602 82     Resp 08/31/22 0602 20     Temp 08/31/22 0602 98.2 F (36.8 C)     Temp Source 08/31/22 0602 Oral     SpO2 08/31/22 0602 100 %     Weight 08/31/22 0558 84.4 kg (186 lb)     Height 08/31/22 0558 1.651 m (5\' 5" )     Head Circumference --      Peak Flow --      Pain Score 08/31/22 0558 10     Pain Loc --      Pain Edu? --      Excl. in [place]? --     Most recent vital signs: Vitals:   08/31/22 0700 08/31/22 0730  BP: 113/81 (!) 125/91  Pulse: 74 78  Resp: 15 19  Temp:    SpO2: 98% 98%     General: Awake, generally well-appearing. CV:  Good peripheral perfusion.  Normal heart sounds.  Regular rate and rhythm. Resp:  Normal effort.  Lungs are clear to auscultation bilaterally. Abd:  No distention.  No tenderness to palpation including the epigastrium or right upper quadrant.   Obese. Other:  Mood and affect are normal and appropriate.  Awake and alert.  No focal neurological deficits.   ED Results / Procedures / Treatments   Labs (all labs ordered are listed, but only abnormal results are displayed) Labs Reviewed  CBC - Abnormal; Notable for the following components:      Result Value   Hemoglobin 11.9 (*)    All other components within normal limits  COMPREHENSIVE METABOLIC PANEL - Abnormal; Notable for the following components:   Sodium 134 (*)    Glucose, Bld 359 (*)    BUN 23 (*)    Creatinine, Ser 1.32 (*)    GFR, Estimated 47 (*)    All other components within normal limits  LIPASE, BLOOD - Abnormal; Notable for the following components:   Lipase 54 (*)    All other components within normal limits  TROPONIN I (HIGH SENSITIVITY) - Abnormal; Notable for the following components:   Troponin I (High Sensitivity) 19 (*)    All other components within normal limits  D-DIMER,  QUANTITATIVE  TROPONIN I (HIGH SENSITIVITY)     EKG  ED ECG REPORT I, Loleta Rose, the attending physician, personally viewed and interpreted this ECG.  Date: 08/31/2022 EKG Time: 6:03 AM Rate: 79 Rhythm: normal sinus rhythm QRS Axis: normal Intervals: QTc prolongation at 550 ms ST/T Wave abnormalities: Non-specific ST segment / T-wave changes, but no clear evidence of acute ischemia. Narrative Interpretation: no definitive evidence of acute ischemia; does not meet STEMI criteria.    RADIOLOGY I viewed and interpreted the patient's two-view chest x-ray.  No acute abnormalities identified.  I also read the radiologist's report, which confirmed no acute findings.    PROCEDURES:  Critical Care performed: No  Procedures   MEDICATIONS ORDERED IN ED: Medications  aspirin chewable tablet 324 mg (324 mg Oral Given 08/31/22 0740)  sodium chloride 0.9 % bolus 500 mL (500 mLs Intravenous New Bag/Given 08/31/22 0746)     IMPRESSION / MDM / ASSESSMENT AND PLAN / ED  COURSE  I reviewed the triage vital signs and the nursing notes.                              Differential diagnosis includes, but is not limited to, musculoskeletal strain, ACS, PE, pneumonia, pneumothorax.  Patient's presentation is most consistent with acute presentation with potential threat to life or bodily function.  Vital signs are stable and within normal limits.  Labs/studies ordered include CMP, CBC, lipase, high-sensitivity troponin, two-view chest x-ray, EKG.  The patient is on the cardiac monitor to evaluate for evidence of arrhythmia and/or significant heart rate changes.  Patient's presentation is not completely consistent with ACS but it is a possibility.  I reviewed her medical record including her cardiovascular interventions and an echocardiographic showing an EF of 30 to 35%.  High-sensitivity troponin is pending.  CBC is normal, comprehensive metabolic panel shows hyperglycemia and slight elevation of BUN and creatinine.  Lipase is slightly elevated at 54 but this is not clinically consistent.  As document above, two-view chest x-ray is normal and there is no ischemia on her EKG.  Plan is to reassess after high-sensitivity troponin.  She will at least need to high-sensitivity troponin measurements while in the ED.   Clinical Course as of 08/31/22 0747  Caleen Essex Aug 31, 2022  7628 I reassessed the patient.  She is still having chest pain.  Her initial high-sensitivity troponin is 19 which is just barely above the upper limit of normal but not clearly consistent with ACS.  Her chest pain is atypical and pleuritic.  I talked with her about the plan.  I ordered 1 full dose aspirin and 2 Percocet.  She remains on the cardiac monitor.  I ordered a D-dimer; if her dimer is greater than the age-adjusted cutoff of 580, she may benefit from a CTA chest to rule out PE.  Regardless she needs a repeat high-sensitivity troponin in 2 hours which should be approximately 8:15 AM.  I  discussed this plan with both the patient and her wife as well as with Dr. Modesto Charon, who is assuming ED care. [CF]  0740 I also ordered a 500 mL normal saline IV bolus.  Her creatinine has been elevated in the past but most recently was about 1.  Tonight it is 1.3.  I am hydrating her slightly in case she requires a CTA chest. [CF]    Clinical Course User Index [CF] Loleta Rose, MD     FINAL  CLINICAL IMPRESSION(S) / ED DIAGNOSES   Final diagnoses:  Atypical chest pain     Rx / DC Orders   ED Discharge Orders     None        Note:  This document was prepared using Dragon voice recognition software and may include unintentional dictation errors.   Hinda Kehr, MD 08/31/22 [phone]

## 2022-08-31 NOTE — ED Triage Notes (Signed)
Patient ambulatory to triage with steady gait, without difficulty or distress noted; pt reports mid CP, nonradiating since last night with no accomp symptoms; denies hx of same

## 2022-08-31 NOTE — ED Provider Notes (Signed)
I was asked to follow-up with D-dimer and repeat troponin.  Troponin is flat.  D-dimer negative.  Patient with some relief of pain with medications.  I considered admission/observation for serial troponins and ongoing symptom management but patient feels slightly better and low suspicion of ACS, she has established care with Dr. Rockey Situ of cardiology and I sent a message to Dr. Rockey Situ as well as an epic ED referral to cardiology and advised the patient to call their office this morning to arrange close follow-up, also the patient and her partner strict return precautions for any worsening/progression or new symptoms --with this plan in place, plan is for discharge and outpatient follow-up.   Lucillie Garfinkel, MD 08/31/22 [phone]

## 2022-09-05 ENCOUNTER — Telehealth: Payer: Self-pay

## 2022-09-05 NOTE — Telephone Encounter (Signed)
        Patient  visited [facility] on 08/31/2022  for Atypical chest pain.   Telephone encounter attempt :  1st  A HIPAA compliant voice message was left requesting a return call.  Instructed patient to call back at [phone].   [place] Resource Care Guide   [email or web address]  ?? [phone]   Website: [email or web address]  [email or web address]

## 2022-09-06 ENCOUNTER — Telehealth: Payer: Self-pay

## 2022-09-06 ENCOUNTER — Other Ambulatory Visit: Payer: Self-pay | Admitting: Family Medicine

## 2022-09-06 NOTE — Telephone Encounter (Signed)
        Patient  visited [facility] on 08/31/2022  for Atypical chest pain.   Telephone encounter attempt :  2nd  A HIPAA compliant voice message was left requesting a return call.  Instructed patient to call back at [phone].   [place] Resource Care Guide   [email or web address]  ?? [phone]   Website: [email or web address]  [email or web address]

## 2022-09-10 ENCOUNTER — Telehealth: Payer: Self-pay

## 2022-09-10 NOTE — Telephone Encounter (Signed)
        Patient  visited [facility] on 08/31/2022  for Atypical chest pain.   Telephone encounter attempt :  3rd  A HIPAA compliant voice message was left requesting a return call.  Instructed patient to call back at [phone].   [place] Resource Care Guide   [email or web address]  ?? [phone]   Website: [email or web address]  [email or web address]

## 2022-09-30 ENCOUNTER — Other Ambulatory Visit: Payer: Self-pay | Admitting: Family Medicine

## 2022-09-30 DIAGNOSIS — F3132 Bipolar disorder, current episode depressed, moderate: Secondary | ICD-10-CM

## 2022-10-02 NOTE — Telephone Encounter (Signed)
Requested medication (s) are due for refill today: expired medication  Requested medication (s) are on the active medication list: yes   Last refill:  remeron- 05/15/22 #90 0 refills, lantus- 05/07/22-08/05/22 #66m 0 refills  Future visit scheduled: yes in 1 month  Notes to clinic:  no refills left - remeron, lantus - expired medication . Do you want to refill remeron Rx and renew lantus Rx?     Requested Prescriptions  Pending Prescriptions Disp Refills   mirtazapine (REMERON) 7.5 MG tablet [Pharmacy Med Name: MIRTAZAPINE 7.5 MG TABLET] 90 tablet 0    Sig: TAKE 1 TABLET BY MOUTH AT BEDTIME.     Psychiatry: Antidepressants - mirtazapine Passed - 09/30/2022  3:24 PM      Passed - Completed PHQ-2 or PHQ-9 in the last 360 days      Passed - Valid encounter within last 6 months    Recent Outpatient Visits           1 month ago Type 2 diabetes mellitus with stage 3a chronic kidney disease, with long-term current use of insulin ([place]   [place], [place]   4 months ago Routine general medical examination at a health care facility   [facility] [place] [place]   7 months ago Type 2 diabetes mellitus with stage 3a chronic kidney disease, with long-term current use of insulin ([place]   [place], DO   10 months ago Benign hypertensive renal disease   [place], DO   1 year ago Benign hypertensive renal disease   [place] [facility]ino [facility]       Future Appointments             In 1 week Gollan, TKathlene November MD [place]  In 1 month JWynetta Emery [place]b Merino DO [place] [facility]             LANTUS SOLOSTAR 100 UNIT/ML Solostar Pen [[employer] Name: LANTUS SOLOSTAR 100 UNIT/ML]      Sig: INJECT 45 UNITS INTO THE SKIN DAILY AT 10 PM.     Endocrinology:   Diabetes - Insulins Failed - 09/30/2022  3:24 PM      Failed - HBA1C is between 0 and 7.9 and within 180 days    Hemoglobin A1C  Date Value Ref Range Status  02/19/2018 7.8  Final   HB A1C (BAYER DCA - WAIVED)  Date Value Ref Range Status  05/15/2022 9.7 (H) 4.8 - 5.6 % Final    Comment:             Prediabetes: 5.7 - 6.4          Diabetes: >6.4          Glycemic control for adults with diabetes: <7.0    Hgb A1c MFr Bld  Date Value Ref Range Status  08/15/2022 9.7 (H) 4.8 - 5.6 % Final    Comment:             Prediabetes: 5.7 - 6.4          Diabetes: >6.4          Glycemic control for adults with diabetes: <7.0          Passed - Valid encounter within last 6 months    Recent Outpatient Visits  1 month ago Type 2 diabetes mellitus with stage 3a chronic kidney disease, with long-term current use of insulin ([place])   [place], Megan P, DO   4 months ago Routine general medical examination at a health care facility   [facility], [place] P, DO   7 months ago Type 2 diabetes mellitus with stage 3a chronic kidney disease, with long-term current use of insulin ([place])   [place], Megan P, DO   10 months ago Benign hypertensive renal disease   [place], Megan P, DO   1 year ago Benign hypertensive renal disease   [place] [facility] Valerie Roys, DO       Future Appointments             In 1 week Gollan, Kathlene November, MD [place] [facility] [place]   In 1 month Valerie Roys, DO [place], [place]

## 2022-10-08 DIAGNOSIS — E119 Type 2 diabetes mellitus without complications: Secondary | ICD-10-CM | POA: Diagnosis not present

## 2022-10-08 DIAGNOSIS — Z0100 Encounter for examination of eyes and vision without abnormal findings: Secondary | ICD-10-CM | POA: Diagnosis not present

## 2022-10-08 DIAGNOSIS — Z961 Presence of intraocular lens: Secondary | ICD-10-CM | POA: Diagnosis not present

## 2022-10-08 LAB — HM DIABETES EYE EXAM

## 2022-10-10 NOTE — Progress Notes (Signed)
Cardiology Office Note  Date:  10/12/2022   ID:  Bonnie Ochoa, DOB 1965-12-29, MRN [id number]  PCP:  Valerie Roys, DO   Chief Complaint  Patient presents with   6 month follow up     Patient c/o shortness of breath with climbing stairs & chest pain that comes and goes. Medications reviewed by the patient verbally.     HPI:  [name] is a 57 yo woman with PMH Medical and appt noncompliance, Substance abuse-chronic /marijuana Previous alcohol problem ascites, cirrhosis, History of paracentesis bipolar,  anxiety diabetes, hypertension,  asthma,  Obstructive sleep apnea, restless leg syndrome Nonischemic cardiomyopathy,  history of ICD,  Cath 2019: no CAD ejection fraction 25%, 05/2018 EF  25 to 30% on 12/21 EF 30 to 35% in March 2023 anemia Previously on pain medication at home, oxycodone She presents today for follow-up of her chronic systolic CHF  Last seen by myself in clinic August 2023 Has not been seen by EP in several years, has device  Medication list as below - Continue spironolactone to 25 mg daily.  - Continue carvedilol 12.5 mg twice a day. - Continue digoxin 0.125 mg daily.  - Continue Jardiance 25 daily - Change Lasix 20 mg to M/W/F, OK to take extra if needed - Continue Entresto 49/51 bid mg  In follow-up today reports that she feels well, little bit of occasional shortness of breath on climbing stairs Denies significant abdominal distention or leg swelling Review of weights shows she is up 10 pounds since August 2023 Weight seems to cycle up and down through the summer and winter Family who does her medications are not here with her today though she reports compliance Family reports the medications in the pillbox and encourages compliance  Denies tachypalpitations concerning for arrhythmia   digoxin 1.4 in April 2023 on digoxin 0.125 daily Digoxin changed to every other day Did not qualify for verquvo, too expensive  Prior records  reviewed Echocardiogram March 2023 Ejection fraction 30 to 35% moderately dilated Prior EF 2021 was 25 to 30%  Lab work reviewed A1c greater than 9 Creatinine 1.32 BUN 23  EKG personally reviewed by myself on todays visit Normal sinus rhythm rate 78 bpm nonspecific ST abnormality  Other past medical history reviewed hospitalization October 25, 2018 for renal failure CR 2.15 Felt to be secondary to dehydration and improved with IV fluids She was continued on Lasix at discharge   Other past medical history reviewed Previous hospitalization November 2018 for hyperglycemia nonketotic coma She had acute renal failure and hyponatremia  Hospital admission 09/10/16 numerous sx on arrival to ER chest pain, shortness of breath, abdominal pain, dysuria, urinary frequency, syncope 2 today with lightheadedness, nausea and vomiting, and diarrhea , sob TBili 2.4, had paracentesis, diuresis D/c on lasix 60 BID   Hospital admission 09/17/16: chest pain, ABD pain fevers, chills, chest pain, shortness of breath, vomiting and diarrhea. "ran out of meds" HTN, acute on chronic systolic CHF   Seen in the emergency room 05/19/2016 for abdominal pain, chest pain BNP in the hospital 3700  acute on chronic CHF, Had 20 L diuresis   CT scan consistent with moderate abdominal ascites, fatty liver Aortic atherosclerosis   PMH:   has a past medical history of Abdominal pain, ADHD, AICD (automatic cardioverter/defibrillator) present, Anemia, Arthritis, Asthma, Bipolar 1 disorder ([place]), Chest pain, CHF (congestive heart failure) ([place]), Cirrhosis of liver ([place]), Coronary artery disease, Depression, Diabetes mellitus without complication ([place]), Diverticulitis, Dysrhythmia, Heart murmur,  HFrEF (heart failure with reduced ejection fraction) ([place]), Hypertension, Hypothyroidism, IBS (irritable bowel syndrome), Insomnia, Migraines, NICM (nonischemic cardiomyopathy) ([place]), OSA (obstructive sleep apnea), PTSD  (post-traumatic stress disorder), Restless leg syndrome, and Vertigo.  PSH:    Past Surgical History:  Procedure Laterality Date   ABDOMINAL HYSTERECTOMY     CARDIAC CATHETERIZATION     CATARACT EXTRACTION W/PHACO Right 01/27/2019   Procedure: CATARACT EXTRACTION PHACO AND INTRAOCULAR LENS PLACEMENT ([place])  VISION BLUE RIGHT DIABETES;  Surgeon: Birder Robson, MD;  Location: [facility];  Service: Ophthalmology;  Laterality: Right;  Diabetes - insulin sleep apnea   CATARACT EXTRACTION W/PHACO Left 03/08/2020   Procedure: CATARACT EXTRACTION PHACO AND INTRAOCULAR LENS PLACEMENT ([facility]) LEFT DIABETIC 10.28  00:57.8;  Surgeon: Birder Robson, MD;  Location: [facility];  Service: Ophthalmology;  Laterality: Left;   COLONOSCOPY WITH PROPOFOL N/A 03/11/2019   Procedure: COLONOSCOPY WITH PROPOFOL;  Surgeon: Virgel Manifold, MD;  Location: [facility];  Service: Endoscopy;  Laterality: N/A;   CORONARY ARTERY BYPASS GRAFT     Pt denies   ESOPHAGOGASTRODUODENOSCOPY (EGD) WITH PROPOFOL N/A 03/11/2019   Procedure: ESOPHAGOGASTRODUODENOSCOPY (EGD) WITH PROPOFOL;  Surgeon: Virgel Manifold, MD;  Location: [facility];  Service: Endoscopy;  Laterality: N/A;   INSERT / REPLACE / REMOVE PACEMAKER     ICD   INSERTION OF ICD  2016   St Jude Single chamber ICD   RIGHT/LEFT HEART CATH AND CORONARY ANGIOGRAPHY N/A 06/09/2018   Procedure: RIGHT/LEFT HEART CATH AND CORONARY ANGIOGRAPHY;  Surgeon: Wellington Hampshire, MD;  Location: [facility];  Service: Cardiovascular;  Laterality: N/A;   TONSILLECTOMY     TUBAL LIGATION  1980    Current Outpatient Medications  Medication Sig Dispense Refill   acetaminophen (TYLENOL) 325 MG tablet Take 650 mg by mouth every 6 (six) hours as needed.     carvedilol (COREG) 12.5 MG tablet TAKE 1 TABLET (12.'5MG'$  TOTAL) BY MOUTH TWICE A DAY WITH MEALS 180 tablet 0   cyclobenzaprine (FLEXERIL) 5 MG tablet TAKE 1 TABLET BY MOUTH THREE TIMES A  DAY AS NEEDED FOR MUSCLE SPASMS 60 tablet 1   digoxin (LANOXIN) 0.125 MG tablet TAKE 1 TABLET BY MOUTH EVERY OTHER DAY 90 tablet 0   empagliflozin (JARDIANCE) 25 MG TABS tablet Take 1 tablet (25 mg total) by mouth daily. 90 tablet 1   famotidine (PEPCID) 40 MG tablet TAKE 1 TABLET BY MOUTH EVERY DAY 90 tablet 2   furosemide (LASIX) 20 MG tablet TAKE 1 TABLET (20 MG TOTAL) BY MOUTH AS DIRECTED. TAKE '20MG'$  (1 TABLET) MONDAY, WEDNESDAY, FRIDAY. 36 tablet 0   gabapentin (NEURONTIN) 100 MG capsule Take 1 capsule (100 mg total) by mouth at bedtime. 90 capsule 1   insulin glargine (LANTUS SOLOSTAR) 100 UNIT/ML Solostar Pen INJECT 45 UNITS INTO THE SKIN DAILY AT 10 PM. 15 mL 0   lamoTRIgine (LAMICTAL) 25 MG tablet TAKE 1 TABLET (25 MG TOTAL) BY MOUTH DAILY. 90 tablet 0   metoCLOPramide (REGLAN) 10 MG tablet Take 10 mg by mouth 4 (four) times daily.     mirtazapine (REMERON) 7.5 MG tablet TAKE 1 TABLET BY MOUTH AT BEDTIME. 90 tablet 0   pantoprazole (PROTONIX) 40 MG tablet TAKE 1 TABLET BY MOUTH EVERY DAY 90 tablet 1   potassium chloride SA (KLOR-CON M20) 20 MEQ tablet TAKE 2 TABLETS BY MOUTH TWICE A DAY 120 tablet 3   rosuvastatin (CRESTOR) 40 MG tablet Take 1 tablet (40 mg total) by mouth daily.  90 tablet 1   sacubitril-valsartan (ENTRESTO) 49-51 MG TAKE 1 TABLET BY MOUTH TWICE A DAY 60 tablet 1   spironolactone (ALDACTONE) 25 MG tablet TAKE 1 TABLET (25 MG TOTAL) BY MOUTH DAILY. 90 tablet 1   sucralfate (CARAFATE) 1 g tablet TAKE 1 TABLET (1 G TOTAL) BY MOUTH 4 (FOUR) TIMES DAILY. 360 tablet 3   tirzepatide (MOUNJARO) 12.5 MG/0.5ML Pen Inject 12.5 mg into the skin once a week. 6 mL 2   Vericiguat (VERQUVO) 10 MG TABS Take 10 mg by mouth daily. 30 tablet 3   blood glucose meter kit and supplies KIT Dispense based on patient and insurance preference. Use up to four times daily as directed. (FOR ICD-9 250.00, 250.01). (Patient not taking: Reported on 10/12/2022) 1 each 0   Insulin Pen Needle 32G X 6 MM MISC 1  each by Does not apply route daily. (Patient not taking: Reported on 10/12/2022) 100 each 12   No current facility-administered medications for this visit.    Allergies:   Levothyroxine   Social History:  The patient  reports that she quit smoking about 39 years ago. Her smoking use included cigarettes. She has never used smokeless tobacco. She reports that she does not currently use alcohol. She reports current drug use. Frequency: 1.00 time per week. Drug: Marijuana.   Family History:   family history includes Alzheimer's disease in her maternal grandfather; Asthma in her sister and sister; Breast cancer in her paternal aunt; Cancer in her sister; Diabetes in her sister and sister; Heart disease in her mother and sister; Hyperlipidemia in her brother and mother; Hypertension in her mother, sister, and sister.    Review of Systems: Review of Systems  Constitutional:  Positive for weight loss.  HENT: Negative.    Respiratory: Negative.    Cardiovascular: Negative.   Gastrointestinal: Negative.   Musculoskeletal: Negative.   Neurological: Negative.   Psychiatric/Behavioral: Negative.    All other systems reviewed and are negative.   PHYSICAL EXAM: VS:  BP 112/70 (BP Location: Left Arm, Patient Position: Sitting, Cuff Size: Normal)   Pulse 78   Ht '5\' 5"'$  (1.651 m)   Wt 188 lb 4 oz (85.4 kg)   SpO2 98%   BMI 31.33 kg/m  , BMI Body mass index is 31.33 kg/m.  Constitutional:  oriented to person, place, and time. No distress.  HENT:  Head: Grossly normal Eyes:  no discharge. No scleral icterus.  Neck: No JVD, no carotid bruits  Cardiovascular: Regular rate and rhythm, no murmurs appreciated Pulmonary/Chest: Clear to auscultation bilaterally, no wheezes or rails Abdominal: Soft.  no distension.  no tenderness.  Musculoskeletal: Normal range of motion Neurological:  normal muscle tone. Coordination normal. No atrophy Skin: Skin warm and dry Psychiatric: normal affect,  pleasant  Recent Labs: 05/15/2022: TSH 0.394 08/31/2022: ALT 20; BUN 23; Creatinine, Ser 1.32; Hemoglobin 11.9; Platelets 171; Potassium 3.7; Sodium 134    Lipid Panel Lab Results  Component Value Date   CHOL 273 (H) 05/15/2022   HDL 34 (L) 05/15/2022   LDLCALC 127 (H) 05/15/2022   TRIG 610 (HH) 05/15/2022     Wt Readings from Last 3 Encounters:  10/12/22 188 lb 4 oz (85.4 kg)  08/31/22 186 lb (84.4 kg)  08/15/22 188 lb (85.3 kg)    ASSESSMENT AND PLAN:  Problem List Items Addressed This Visit       Cardiology Problems   Chronic systolic heart failure (HCC) - Primary (Chronic)   NICM (nonischemic cardiomyopathy) ([place])  Pulmonary hypertension ([place])   Other Visit Diagnoses     Essential hypertension       ICD (implantable cardioverter-defibrillator) in place       OSA (obstructive sleep apnea)       CKD (chronic kidney disease), stage II       Poorly controlled diabetes mellitus ([place])          Nonischemic cardiomyopathy EF 30 to 35% in 2023 Sister who does medications does not present today Weight up 10 pounds Encouraged her to take extra Lasix for any sensation of abdominal swelling, ankle swelling, increasing shortness of breath -Will again try to set up appointment with EP for defib download Has not been seen in years. optivol data if available would be very beneficial  Diabetes type 2 with complications Hemoglobin A1c continues to run high We have encouraged continued exercise, careful diet management in an effort to lose weight.  Bipolar 1 disorder/PTSD Followed by PMD Stable  Cirrhosis Followed by GI,  Recommend alcohol cessation Also recommend weight loss to minimize risk of NASH    Total encounter time more than 30 minutes  Greater than 50% was spent in counseling and coordination of care with the patient    Signed, Esmond Plants, M.D., Ph.D. [place], [place]

## 2022-10-12 ENCOUNTER — Ambulatory Visit: Payer: Medicare HMO | Attending: Cardiovascular Disease | Admitting: Cardiovascular Disease

## 2022-10-12 ENCOUNTER — Encounter: Payer: Self-pay | Admitting: Cardiovascular Disease

## 2022-10-12 VITALS — BP 112/70 | HR 78 | Ht 65.0 in | Wt 188.2 lb

## 2022-10-12 DIAGNOSIS — I428 Other cardiomyopathies: Secondary | ICD-10-CM

## 2022-10-12 DIAGNOSIS — N182 Chronic kidney disease, stage 2 (mild): Secondary | ICD-10-CM

## 2022-10-12 DIAGNOSIS — Z95810 Presence of automatic (implantable) cardiac defibrillator: Secondary | ICD-10-CM

## 2022-10-12 DIAGNOSIS — E1165 Type 2 diabetes mellitus with hyperglycemia: Secondary | ICD-10-CM | POA: Diagnosis not present

## 2022-10-12 DIAGNOSIS — I5022 Chronic systolic (congestive) heart failure: Secondary | ICD-10-CM

## 2022-10-12 DIAGNOSIS — I272 Pulmonary hypertension, unspecified: Secondary | ICD-10-CM

## 2022-10-12 DIAGNOSIS — G4733 Obstructive sleep apnea (adult) (pediatric): Secondary | ICD-10-CM | POA: Diagnosis not present

## 2022-10-12 DIAGNOSIS — I10 Essential (primary) hypertension: Secondary | ICD-10-CM

## 2022-10-12 MED ORDER — DIGOXIN 125 MCG PO TABS: ORAL_TABLET | ORAL | 2 refills | Status: DC

## 2022-10-12 MED ORDER — SPIRONOLACTONE 25 MG PO TABS: 25.0000 mg | ORAL_TABLET | Freq: Every day | ORAL | 2 refills | Status: DC

## 2022-10-12 MED ORDER — ROSUVASTATIN CALCIUM 40 MG PO TABS: 40.0000 mg | ORAL_TABLET | Freq: Every day | ORAL | 2 refills | Status: DC

## 2022-10-12 MED ORDER — CARVEDILOL 12.5 MG PO TABS: ORAL_TABLET | ORAL | 2 refills | Status: DC

## 2022-10-12 MED ORDER — ENTRESTO 49-51 MG PO TABS: 1.0000 | ORAL_TABLET | Freq: Two times a day (BID) | ORAL | 11 refills | Status: DC

## 2022-10-12 NOTE — Patient Instructions (Addendum)
Medication Instructions:  Lasix every other day with extra lasix as needed for ankle swelling, ABD swelling  If you need a refill on your cardiac medications before your next appointment, please call your pharmacy.   Lab work: No new labs needed  Testing/Procedures: No new testing needed  Follow-Up: At [facility], you and your health needs are our priority.  As part of our continuing mission to provide you with exceptional heart care, we have created designated Provider Care Teams.  These Care Teams include your primary Cardiologist (physician) and Advanced Practice Providers (APPs -  Physician Assistants and Nurse Practitioners) who all work together to provide you with the care you need, when you need it.  You will need a follow up appointment in 12 months  Providers on your designated Care Team:   Murray Hodgkins, NP Christell Faith, PA-C Cadence Kathlen Mody, [place]  COVID-19 Vaccine Information can be found at: [email or web address].uk For questions related to vaccine distribution or appointments, please email [email or web address] or call [phone].   Other: Schedule appointment with Dr. Joni Fears available. It is very important for you to keep this appointment.

## 2022-10-15 ENCOUNTER — Ambulatory Visit (INDEPENDENT_AMBULATORY_CARE_PROVIDER_SITE_OTHER): Payer: Medicare HMO

## 2022-10-15 VITALS — Ht 65.0 in | Wt 188.0 lb

## 2022-10-15 DIAGNOSIS — E119 Type 2 diabetes mellitus without complications: Secondary | ICD-10-CM

## 2022-10-15 DIAGNOSIS — Z0000 Encounter for general adult medical examination without abnormal findings: Secondary | ICD-10-CM | POA: Diagnosis not present

## 2022-10-15 NOTE — Addendum Note (Signed)
Addended by: Michel Santee on: 10/15/2022 01:24 PM   Modules accepted: Orders

## 2022-10-15 NOTE — Progress Notes (Signed)
I connected with  [name] on 10/15/22 by a audio enabled telemedicine application and verified that I am speaking with the correct person using two identifiers.  Patient Location: Home  Provider Location: Office/Clinic  I discussed the limitations of evaluation and management by telemedicine. The patient expressed understanding and agreed to proceed.  Subjective:   Bonnie Ochoa is a 57 y.o. female who presents for Medicare Annual (Subsequent) preventive examination.  Review of Systems     Cardiac Risk Factors include: advanced age (>71mn, >>2women);diabetes mellitus;hypertension     Objective:    There were no vitals filed for this visit. There is no height or weight on file to calculate BMI.     10/15/2022    8:57 AM 08/31/2022    5:59 AM 09/11/2021   10:24 AM 10/26/2020   10:42 AM 09/09/2020    9:49 AM 03/08/2020    6:46 AM 03/11/2019    8:06 AM  Advanced Directives  Does Patient Have a Medical Advance Directive? No No No No No No No  Would patient like information on creating a medical advance directive? No - Patient declined No - Patient declined No - Patient declined Yes (MAU/Ambulatory/Procedural Areas - Information given)  Yes (MAU/Ambulatory/Procedural Areas - Information given) No - Patient declined    Current Medications (verified) Outpatient Encounter Medications as of 10/15/2022  Medication Sig   acetaminophen (TYLENOL) 325 MG tablet Take 650 mg by mouth every 6 (six) hours as needed.   blood glucose meter kit and supplies KIT Dispense based on patient and insurance preference. Use up to four times daily as directed. (FOR ICD-9 250.00, 250.01).   carvedilol (COREG) 12.5 MG tablet TAKE 1 TABLET (12.'5MG'$  TOTAL) BY MOUTH TWICE A DAY WITH MEALS   cyclobenzaprine (FLEXERIL) 5 MG tablet TAKE 1 TABLET BY MOUTH THREE TIMES A DAY AS NEEDED FOR MUSCLE SPASMS   digoxin (LANOXIN) 0.125 MG tablet TAKE 1 TABLET BY MOUTH EVERY OTHER DAY   empagliflozin (JARDIANCE) 25 MG TABS  tablet Take 1 tablet (25 mg total) by mouth daily.   famotidine (PEPCID) 40 MG tablet TAKE 1 TABLET BY MOUTH EVERY DAY   furosemide (LASIX) 20 MG tablet TAKE 1 TABLET (20 MG TOTAL) BY MOUTH AS DIRECTED. TAKE '20MG'$  (1 TABLET) MONDAY, WEDNESDAY, FRIDAY.   gabapentin (NEURONTIN) 100 MG capsule Take 1 capsule (100 mg total) by mouth at bedtime.   insulin glargine (LANTUS SOLOSTAR) 100 UNIT/ML Solostar Pen INJECT 45 UNITS INTO THE SKIN DAILY AT 10 PM.   Insulin Pen Needle 32G X 6 MM MISC 1 each by Does not apply route daily.   lamoTRIgine (LAMICTAL) 25 MG tablet TAKE 1 TABLET (25 MG TOTAL) BY MOUTH DAILY.   metoCLOPramide (REGLAN) 10 MG tablet Take 10 mg by mouth 4 (four) times daily.   mirtazapine (REMERON) 7.5 MG tablet TAKE 1 TABLET BY MOUTH AT BEDTIME.   pantoprazole (PROTONIX) 40 MG tablet TAKE 1 TABLET BY MOUTH EVERY DAY   potassium chloride SA (KLOR-CON M20) 20 MEQ tablet TAKE 2 TABLETS BY MOUTH TWICE A DAY   rosuvastatin (CRESTOR) 40 MG tablet Take 1 tablet (40 mg total) by mouth daily.   sacubitril-valsartan (ENTRESTO) 49-51 MG Take 1 tablet by mouth 2 (two) times daily.   spironolactone (ALDACTONE) 25 MG tablet Take 1 tablet (25 mg total) by mouth daily.   sucralfate (CARAFATE) 1 g tablet TAKE 1 TABLET (1 G TOTAL) BY MOUTH 4 (FOUR) TIMES DAILY.   tirzepatide (MOUNJARO) 12.5 MG/0.5ML Pen Inject 12.5  mg into the skin once a week.   No facility-administered encounter medications on file as of 10/15/2022.    Allergies (verified) Levothyroxine   History: Past Medical History:  Diagnosis Date   Abdominal pain    a. 05/2018 HIDA scan wnl.   ADHD    AICD (automatic cardioverter/defibrillator) present    a. 11/2014 s/p SJM Fortify Assura, single lead AICD (ser# [id number]).   Anemia    Arthritis    Asthma    Bipolar 1 disorder ([place])    Chest pain    a. Hx of cath in [place] - reportedly nl; b. 04/2018 MV: EF 22%, fixed dist ant septal, apical, and inferoapical defects - ? scar vs. attenuation. No  ischemia.   CHF (congestive heart failure) (HCC)    Cirrhosis of liver ([place])    Coronary artery disease    Depression    Diabetes mellitus without complication ([place])    Diverticulitis    Dysrhythmia    Heart murmur    HFrEF (heart failure with reduced ejection fraction) ([place])    a. 06/2017 Echo: EF 20-25%, diff HK. Mildly dil LA; b. 05/2018 Echo: EF 25-30%, diff HK, Gr2 DD. Triv AI. Mod MR. Mildly reduced RV fxn. Mod-Sev TR. PASP 45-23mHg.   Hypertension    Hypothyroidism    IBS (irritable bowel syndrome)    Insomnia    Migraines    NICM ([place]ischemic cardiomyopathy) [place]    a. EF prev 25%; b. 11/2014 s/p SJM Fortify Assura, single lead AICD (ser# [id number]; c. 06/2017 Echo: EF 20-25%; d. 05/2018 Echo: EF 25-30%, diff HK, Gr2 DD; e. 05/2018 Echo: EF 25-30%; f. 05/2018 Cath: Nl cors. LVEDP 286mg, PCWP 3211m. PA 65/40 (52). CO/CI 3.04/1.56; c. 07/2018 CPX: Mod HF limitation.   OSA (obstructive sleep apnea)    CPAP is broken   PTSD (post-traumatic stress disorder)    Restless leg syndrome    Vertigo    Past Surgical History:  Procedure Laterality Date   ABDOMINAL HYSTERECTOMY     CARDIAC CATHETERIZATION     CATARACT EXTRACTION W/PHACO Right 01/27/2019   Procedure: CATARACT EXTRACTION PHACO AND INTRAOCULAR LENS PLACEMENT (IOCWarVISION BLUE RIGHT DIABETES;  Surgeon: PorBirder RobsonD;  [facility];  Laterality: Right;  Diabetes - insulin sleep apnea   CATARACT EXTRACTION W/PHACO Left 03/08/2020   Procedure: CATARACT EXTRACTION PHACO AND INTRAOCULAR LENS PLACEMENT (IOC) LEFT DIABETIC 10.28  00:57.8;  Surgeon: PorBirder RobsonD;  [facility];  Laterality: Left;   COLONOSCOPY WITH PROPOFOL N/A 03/11/2019   Procedure: COLONOSCOPY WITH PROPOFOL;  Surgeon: TahVirgel ManifoldD;  Location: [facility];  Service: Endoscopy;  Laterality: N/A;   CORONARY ARTERY BYPASS GRAFT     Pt denies    ESOPHAGOGASTRODUODENOSCOPY (EGD) WITH PROPOFOL N/A 03/11/2019   Procedure: ESOPHAGOGASTRODUODENOSCOPY (EGD) WITH PROPOFOL;  Surgeon: TahVirgel ManifoldD;  Location: [facility] END[facility]COPY;  Service: Endoscopy;  Laterality: N/A;   INSERT / REPLACE / REMOVE PACEMAKER     ICD   INSERTION OF ICD  2016   St Jude Single chamber ICD   RIGHT/LEFT HEART CATH AND CORONARY ANGIOGRAPHY N/A 06/09/2018   Procedure: RIGHT/LEFT HEART CATH AND CORONARY ANGIOGRAPHY;  Surgeon: AriWellington HampshireD;  [facility];  Service: Cardiovascular;  Laterality: N/A;   TONSILLECTOMY     TUBAL LIGATION  1980   Family History  Problem Relation Age of Onset   Hypertension Mother  Hyperlipidemia Mother    Heart disease Mother    Hypertension Sister    Asthma Sister    Heart disease Sister    Diabetes Sister    Cancer Sister    Alzheimer's disease Maternal Grandfather    Hyperlipidemia Brother    Asthma Sister    Hypertension Sister    Diabetes Sister    Breast cancer Paternal Aunt    Social History   Socioeconomic History   Marital status: Married    Spouse name: Not on file   Number of children: 2   Years of education: Not on file   Highest education level: High school graduate  Occupational History    Comment: disabled  Tobacco Use   Smoking status: Former    Types: Cigarettes    Quit date: 1985    Years since quitting: 39.1   Smokeless tobacco: Never   Tobacco comments:    quit over  20 years ago   Vaping Use   Vaping Use: Never used  Substance and Sexual Activity   Alcohol use: Not Currently   Drug use: Yes    Frequency: 1.0 times per week    Types: Marijuana    Comment: occassionally   Sexual activity: Yes    Birth control/protection: Surgical    Comment: one partner  Other Topics Concern   Not on file  Social History Narrative   Volunteers occasionally    Social Determinants of Health   Financial Resource Strain: Low Risk  (10/15/2022)   Overall Financial Resource  Strain (CARDIA)    Difficulty of Paying Living Expenses: Not hard at all  Food Insecurity: No Food Insecurity (10/15/2022)   Hunger Vital Sign    Worried About Running Out of Food in the Last Year: Never true    [place] in the Last Year: Never true  Transportation Needs: No Transportation Needs (10/15/2022)   PRAPARE - Hydrologist (Medical): No    Lack of Transportation (Non-Medical): No  Physical Activity: Insufficiently Active (10/15/2022)   Exercise Vital Sign    Days of Exercise per Week: 3 days    Minutes of Exercise per Session: 30 min  Stress: No Stress Concern Present (10/15/2022)   [place]    Feeling of Stress : Not at all  Social Connections: Moderately Isolated (10/15/2022)   Social Connection and Isolation Panel [NHANES]    Frequency of Communication with Friends and Family: Twice a week    Frequency of Social Gatherings with Friends and Family: More than three times a week    Attends Religious Services: Never    Marine scientist or Organizations: No    Attends Music therapist: Never    Marital Status: Married    Tobacco Counseling Counseling given: Not Answered Tobacco comments: quit over  20 years ago    Clinical Intake:  Pre-visit preparation completed: Yes  Pain : No/denies pain     Nutritional Risks: None Diabetes: Yes CBG done?: No Did pt. bring in CBG monitor from home?: No  How often do you need to have someone help you when you read instructions, pamphlets, or other written materials from your doctor or pharmacy?: 1 - Never  Diabetic?yes Nutrition Risk Assessment:  Has the patient had any N/V/D within the last 2 months?  No  Does the patient have any non-healing wounds?  No  Has the patient had any unintentional weight loss  or weight gain?  No   Diabetes:  Is the patient diabetic?  Yes  If diabetic, was a CBG obtained today?   No  Did the patient bring in their glucometer from home?  No  How often do you monitor your CBG's? occasionally.   Financial Strains and Diabetes Management:  Are you having any financial strains with the device, your supplies or your medication? No .  Does the patient want to be seen by Chronic Care Management for management of their diabetes?  No  Would the patient like to be referred to a Nutritionist or for Diabetic Management?  Yes   Diabetic Exams:  Diabetic Eye Exam: Completed 09/20/21. Saw MD last Monday. Pt has been advised about the importance in completing this exam.   Diabetic Foot Exam: Completed 05/15/22. Pt has been advised about the importance in completing this exam.   Interpreter Needed?: No  Information entered by :: Kirke Shaggy, LPN   Activities of Daily Living    10/15/2022    8:57 AM 05/15/2022    8:57 AM  In your present state of health, do you have any difficulty performing the following activities:  Hearing? 0 1  Vision? 0 0  Difficulty concentrating or making decisions? 0 1  Walking or climbing stairs? 1 0  Dressing or bathing? 0 0  Doing errands, shopping? 0 0  Preparing Food and eating ? N   Using the Toilet? N   In the past six months, have you accidently leaked urine? N   Do you have problems with loss of bowel control? N   Managing your Medications? Y   Managing your Finances? Y   Housekeeping or managing your Housekeeping? N     Patient Care Team: Valerie Roys, DO as PCP - General (Family Medicine) Minna Merritts, MD as PCP - Cardiology (Cardiology) Deboraha Sprang, MD as PCP - Electrophysiology (Cardiology) Bensimhon, Shaune Pascal, MD as PCP - Advanced Heart Failure (Cardiology) [name], MD as Consulting Physician (Gastroenterology) Minna Merritts, MD as Consulting Physician (Cardiology) Alisa Graff, FNP as Nurse Practitioner (Family Medicine) Rebekah [place], LCSW as Social Worker (Licensed Clinical Social  Worker)  Indicate any recent [place] you may have received from other than [facility] providers in the past year (date may be approximate).     Assessment:   This is a routine wellness examination for [place].  Hearing/Vision screen Hearing Screening - Comments:: No aids  Vision Screening - Comments:: Wears glasses- [facility]  Dietary issues and exercise activities discussed: Current Exercise Habits: Home exercise routine, Time (Minutes): 30, Frequency (Times/Week): 3, Weekly Exercise (Minutes/Week): 90, Intensity: Mild   Goals Addressed             This Visit's Progress    DIET - EAT MORE FRUITS AND VEGETABLES         Depression Screen    10/15/2022    8:55 AM 08/15/2022    8:45 AM 05/15/2022    8:57 AM 03/05/2022    9:18 AM 02/12/2022    9:05 AM 12/05/2021    1:34 PM 11/13/2021   11:19 AM  PHQ 2/9 Scores  PHQ - 2 Score 0 '2 3  1  2  '$ PHQ- 9 Score 0 '6 13  4  8     '$ Information is confidential and restricted. Go to Review Flowsheets to unlock data.    Fall Risk    10/15/2022    8:57 AM 08/15/2022  8:45 AM 05/15/2022    8:57 AM 02/12/2022    9:05 AM 11/13/2021   11:19 AM  Fall Risk   Falls in the past year? 0 0 0 0 0  Number falls in past yr: 0 0 0 0 0  Injury with Fall? 0 0 0 0 0  Risk for fall due to : No Fall Risks No Fall Risks No Fall Risks No Fall Risks   Follow up Falls prevention discussed;Falls evaluation completed Falls evaluation completed Falls evaluation completed Falls evaluation completed     FALL RISK PREVENTION PERTAINING TO THE HOME:  Any stairs in or around the home? Yes  If so, are there any without handrails? No  Home free of loose throw rugs in walkways, pet beds, electrical cords, etc? Yes  Adequate lighting in your home to reduce risk of falls? Yes   ASSISTIVE DEVICES UTILIZED TO PREVENT FALLS:  Life alert? No  Use of a cane, walker or w/c? No  Grab bars in the bathroom? No  Shower chair or bench in shower? No  Elevated toilet seat or a  handicapped toilet? No    Cognitive Function:        10/15/2022    9:04 AM 09/11/2021   10:08 AM 09/09/2020    9:51 AM 05/18/2019    3:15 PM 02/19/2018   10:53 AM  6CIT Screen  What Year? 0 points 0 points 0 points 0 points 0 points  What month? 0 points 0 points 0 points 0 points 0 points  What time? 0 points 0 points 0 points 0 points 0 points  Count back from 20 0 points 0 points 0 points 0 points 0 points  Months in reverse 0 points 0 points 4 points 0 points 0 points  Repeat phrase 0 points 4 points 8 points 0 points 2 points  Total Score 0 points 4 points 12 points 0 points 2 points    Immunizations Immunization History  Administered Date(s) Administered   Hepatitis B, ADULT 11/03/2018   Influenza,inj,Quad PF,6+ Mos 05/31/2016, 05/02/2017, 07/04/2017, 04/07/2018, 06/13/2018, 04/13/2019, 05/09/2020, 05/18/2021, 05/15/2022   Janssen (J&J) SARS-COV-2 Vaccination 08/27/2019, 12/25/2019   Pneumococcal Polysaccharide-23 07/04/2017   Td 04/07/2018    TDAP status: Up to date  Flu Vaccine status: Up to date  Pneumococcal vaccine status: Up to date  Covid-19 vaccine status: Completed vaccines  Qualifies for Shingles Vaccine? Yes   Zostavax completed No   Shingrix Completed?: No.    Education has been provided regarding the importance of this vaccine. Patient has been advised to call insurance company to determine out of pocket expense if they have not yet received this vaccine. Advised may also receive vaccine at local pharmacy or Health Dept. Verbalized acceptance and understanding.  Screening Tests Health Maintenance  Topic Date Due   Zoster Vaccines- Shingrix (1 of 2) Never done   COVID-19 Vaccine (3 - 2023-24 season) 04/13/2022   MAMMOGRAM  07/12/2022   OPHTHALMOLOGY EXAM  09/20/2022   HEMOGLOBIN A1C  02/13/2023   Diabetic kidney evaluation - Urine ACR  05/16/2023   FOOT EXAM  05/16/2023   Diabetic kidney evaluation - eGFR measurement  09/01/2023   Medicare Annual  Wellness (AWV)  10/15/2023   DTaP/Tdap/Td (2 - Tdap) 04/07/2028   COLONOSCOPY (Pts 45-50yr Insurance coverage will need to be confirmed)  03/10/2029   INFLUENZA VACCINE  Completed   Hepatitis C Screening  Completed   HIV Screening  Completed   HPV VACCINES  Aged Out  Health Maintenance  Health Maintenance Due  Topic Date Due   Zoster Vaccines- Shingrix (1 of 2) Never done   COVID-19 Vaccine (3 - 2023-24 season) 04/13/2022   MAMMOGRAM  07/12/2022   OPHTHALMOLOGY EXAM  09/20/2022    Colorectal cancer screening: Type of screening: Colonoscopy. Completed 03/11/19. Repeat every 10 years  Mammogram status: Ordered 05/15/22. Pt provided with contact info and advised to call to schedule appt.   Lung Cancer Screening: (Low Dose CT Chest recommended if Age 3-80 years, 30 pack-year currently smoking OR have quit w/in 15years.) does not qualify.    Additional Screening:  Hepatitis C Screening: does qualify; Completed 05/30/18  Vision Screening: Recommended annual ophthalmology exams for early detection of glaucoma and other disorders of the eye. Is the patient up to date with their annual eye exam?  Yes  Who is the provider or what is the name of the office in which the patient attends annual eye exams? [facility] If pt is not established with a provider, would they like to be referred to a provider to establish care? No .   Dental Screening: Recommended annual dental exams for proper oral hygiene  Community Resource Referral / Chronic Care Management: CRR required this visit?  No   CCM required this visit?  Yes      Plan:     I have personally reviewed and noted the following in the patient's chart:   Medical and social history Use of alcohol, tobacco or illicit drugs  Current medications and supplements including opioid prescriptions. Patient is not currently taking opioid prescriptions. Functional ability and status Nutritional status Physical activity Advanced  directives List of other physicians Hospitalizations, surgeries, and ER visits in previous 12 months Vitals Screenings to include cognitive, depression, and falls Referrals and appointments  In addition, I have reviewed and discussed with patient certain preventive protocols, quality metrics, and best practice recommendations. A written personalized care plan for preventive services as well as general preventive health recommendations were provided to patient.     Dionisio David, LPN   [id number]   Nurse Notes: sent referral for nutritionist

## 2022-10-16 ENCOUNTER — Other Ambulatory Visit
Admission: RE | Admit: 2022-10-16 | Discharge: 2022-10-16 | Disposition: A | Discharge status: Home / Self Care | Payer: Medicare HMO | Source: Ambulatory Visit | Attending: Internal Medicine | Admitting: Internal Medicine

## 2022-10-16 ENCOUNTER — Ambulatory Visit: Payer: Medicare HMO | Attending: Internal Medicine | Admitting: Internal Medicine

## 2022-10-16 ENCOUNTER — Encounter: Payer: Self-pay | Admitting: Internal Medicine

## 2022-10-16 VITALS — BP 120/90 | Ht 65.0 in | Wt 187.0 lb

## 2022-10-16 DIAGNOSIS — I5022 Chronic systolic (congestive) heart failure: Secondary | ICD-10-CM

## 2022-10-16 DIAGNOSIS — Z95810 Presence of automatic (implantable) cardiac defibrillator: Secondary | ICD-10-CM | POA: Diagnosis not present

## 2022-10-16 DIAGNOSIS — Z79899 Other long term (current) drug therapy: Secondary | ICD-10-CM

## 2022-10-16 DIAGNOSIS — K7460 Unspecified cirrhosis of liver: Secondary | ICD-10-CM | POA: Diagnosis not present

## 2022-10-16 LAB — CUP PACEART INCLINIC DEVICE CHECK
Battery Remaining Longevity: 42 mo
Brady Statistic RV Percent Paced: 0 %
Date Time Interrogation Session: 20240305164740
HighPow Impedance: 77.625
Implantable Lead Connection Status: 753985
Implantable Lead Implant Date: 20160410
Implantable Lead Location: 753860
Implantable Pulse Generator Implant Date: 20160410
Lead Channel Impedance Value: 450 Ohm
Lead Channel Pacing Threshold Amplitude: 1.25 V
Lead Channel Pacing Threshold Amplitude: 1.25 V
Lead Channel Pacing Threshold Pulse Width: 0.5 ms
Lead Channel Pacing Threshold Pulse Width: 0.5 ms
Lead Channel Sensing Intrinsic Amplitude: 12 mV
Lead Channel Setting Pacing Amplitude: 2.5 V
Lead Channel Setting Pacing Pulse Width: 0.5 ms
Lead Channel Setting Sensing Sensitivity: 0.5 mV
Pulse Gen Serial Number: 7259964

## 2022-10-16 LAB — DIGOXIN LEVEL: Digoxin Level: <0.2 ng/mL — ABNORMAL LOW (ref 0.8–2.0)

## 2022-10-16 NOTE — Progress Notes (Signed)
Patient Care Team: Valerie Roys, DO as PCP - General (Family Medicine) Minna Merritts, MD as PCP - Cardiology (Cardiology) Deboraha Sprang, MD as PCP - Electrophysiology (Cardiology) Bensimhon, Shaune Pascal, MD as PCP - Advanced Heart Failure (Cardiology) [name], MD as Consulting Physician (Gastroenterology) Rockey Situ Kathlene November, MD as Consulting Physician (Cardiology) Alisa Graff, FNP as Nurse Practitioner (Family Medicine) Rebekah Chesterfield, LCSW as Social Worker (Licensed Clinical Social Worker)   HPI  Bonnie Ochoa is a 57 y.o. female Seen with CC of St Jude  ICD implanted for primary prevention in the setting of NICM been seen since 2018  She has chronic heart failure followed by TG and previously by heart failure  Has lost a bunch of weight The patient denies chest pain, shortness of breath, nocturnal dyspnea, orthopnea or peripheral edema.  There have been no palpitations or syncope.   Lightheadedness with presyncope with standing       Records and Results Reviewed DATE TEST EF   10/17 Echo 20-25 %   11/18 Echo  20.25 %   3/23 Echo  30-35%    Date Cr K Hgb DIG  11/18 4.74 3.1 11.2   12/18 1.24 3.8 9.7   4/23    1.3   1/24 1.32 3.7 11.9<<13.5            Past Medical History:  Diagnosis Date   Abdominal pain    a. 05/2018 HIDA scan wnl.   ADHD    AICD (automatic cardioverter/defibrillator) present    a. 11/2014 s/p SJM Fortify Assura, single lead AICD (ser# [id number]).   Anemia    Arthritis    Asthma    Bipolar 1 disorder ([place])    Chest pain    a. Hx of cath in [place] - reportedly nl; b. 04/2018 MV: EF 22%, fixed dist ant septal, apical, and inferoapical defects - ? scar vs. attenuation. No ischemia.   CHF (congestive heart failure) (HCC)    Cirrhosis of liver ([place])    Coronary artery disease    Depression    Diabetes mellitus without complication ([place])    Diverticulitis    Dysrhythmia    Heart murmur    HFrEF (heart failure with reduced  ejection fraction) ([place])    a. 06/2017 Echo: EF 20-25%, diff HK. Mildly dil LA; b. 05/2018 Echo: EF 25-30%, diff HK, Gr2 DD. Triv AI. Mod MR. Mildly reduced RV fxn. Mod-Sev TR. PASP 45-54mHg.   Hypertension    Hypothyroidism    IBS (irritable bowel syndrome)    Insomnia    Migraines    [place]mic [place]) [place]    a. EF prev 25%; b. 11/2014 s/p SJM Fortify Assura, single lead AICD (ser# [id number]; c. 06/2017 Echo: EF 20-25%; d. 05/2018 Echo: EF 25-30%, diff HK, Gr2 DD; e. 05/2018 Echo: EF 25-30%; f. 05/2018 Cath: Nl cors. LVEDP 225mg, PCWP 3251m. PA 65/40 (52). CO/CI 3.04/1.56; c. 07/2018 CPX: Mod HF limitation.   OSA (obstructive sleep apnea)    CPAP is broken   PTSD (post-traumatic stress disorder)    Restless leg syndrome    Vertigo     Past Surgical History:  Procedure Laterality Date   ABDOMINAL HYSTERECTOMY     CARDIAC CATHETERIZATION     CATARACT EXTRACTION W/PHACO Right 01/27/2019   Procedure: CATARACT EXTRACTION PHACO AND INTRAOCULAR LENS PLACEMENT (IOCTorontoVISION BLUE RIGHT DIABETES;  Surgeon: PorBirder RobsonD;  [facility];  Laterality: Right;  Diabetes - insulin sleep apnea   CATARACT EXTRACTION W/PHACO Left 03/08/2020   Procedure: CATARACT EXTRACTION PHACO AND INTRAOCULAR LENS PLACEMENT (IOC) LEFT DIABETIC 10.28  00:57.8;  Surgeon: Birder Robson, MD;  Location: [facility];  Service: Ophthalmology;  Laterality: Left;   COLONOSCOPY WITH PROPOFOL N/A 03/11/2019   Procedure: COLONOSCOPY WITH PROPOFOL;  Surgeon: Virgel Manifold, MD;  Location: [facility];  Service: Endoscopy;  Laterality: N/A;   CORONARY ARTERY BYPASS GRAFT     Pt denies   ESOPHAGOGASTRODUODENOSCOPY (EGD) WITH PROPOFOL N/A 03/11/2019   Procedure: ESOPHAGOGASTRODUODENOSCOPY (EGD) WITH PROPOFOL;  Surgeon: Virgel Manifold, MD;  Location: [facility];  Service: Endoscopy;  Laterality: N/A;   INSERT / REPLACE / REMOVE PACEMAKER      ICD   INSERTION OF ICD  2016   St Jude Single chamber ICD   RIGHT/LEFT HEART CATH AND CORONARY ANGIOGRAPHY N/A 06/09/2018   Procedure: RIGHT/LEFT HEART CATH AND CORONARY ANGIOGRAPHY;  Surgeon: Wellington Hampshire, MD;  Location: [facility];  Service: Cardiovascular;  Laterality: N/A;   TONSILLECTOMY     TUBAL LIGATION  1980    Current Outpatient Medications  Medication Sig Dispense Refill   acetaminophen (TYLENOL) 325 MG tablet Take 650 mg by mouth every 6 (six) hours as needed.     blood glucose meter kit and supplies KIT Dispense based on patient and insurance preference. Use up to four times daily as directed. (FOR ICD-9 250.00, 250.01). 1 each 0   carvedilol (COREG) 12.5 MG tablet TAKE 1 TABLET (12.'5MG'$  TOTAL) BY MOUTH TWICE A DAY WITH MEALS 180 tablet 2   cyclobenzaprine (FLEXERIL) 5 MG tablet TAKE 1 TABLET BY MOUTH THREE TIMES A DAY AS NEEDED FOR MUSCLE SPASMS 60 tablet 1   digoxin (LANOXIN) 0.125 MG tablet TAKE 1 TABLET BY MOUTH EVERY OTHER DAY 45 tablet 2   empagliflozin (JARDIANCE) 25 MG TABS tablet Take 1 tablet (25 mg total) by mouth daily. 90 tablet 1   famotidine (PEPCID) 40 MG tablet TAKE 1 TABLET BY MOUTH EVERY DAY 90 tablet 2   furosemide (LASIX) 20 MG tablet TAKE 1 TABLET (20 MG TOTAL) BY MOUTH AS DIRECTED. TAKE '20MG'$  (1 TABLET) MONDAY, WEDNESDAY, FRIDAY. 36 tablet 0   gabapentin (NEURONTIN) 100 MG capsule Take 1 capsule (100 mg total) by mouth at bedtime. 90 capsule 1   insulin glargine (LANTUS SOLOSTAR) 100 UNIT/ML Solostar Pen INJECT 45 UNITS INTO THE SKIN DAILY AT 10 PM. 15 mL 0   Insulin Pen Needle 32G X 6 MM MISC 1 each by Does not apply route daily. 100 each 12   lamoTRIgine (LAMICTAL) 25 MG tablet TAKE 1 TABLET (25 MG TOTAL) BY MOUTH DAILY. 90 tablet 0   metoCLOPramide (REGLAN) 10 MG tablet Take 10 mg by mouth 4 (four) times daily.     mirtazapine (REMERON) 7.5 MG tablet TAKE 1 TABLET BY MOUTH AT BEDTIME. 90 tablet 0   pantoprazole (PROTONIX) 40 MG tablet  TAKE 1 TABLET BY MOUTH EVERY DAY 90 tablet 1   potassium chloride SA (KLOR-CON M20) 20 MEQ tablet TAKE 2 TABLETS BY MOUTH TWICE A DAY 120 tablet 3   rosuvastatin (CRESTOR) 40 MG tablet Take 1 tablet (40 mg total) by mouth daily. 90 tablet 2   sacubitril-valsartan (ENTRESTO) 49-51 MG Take 1 tablet by mouth 2 (two) times daily. 60 tablet 11   spironolactone (ALDACTONE) 25 MG tablet Take 1 tablet (25 mg total) by mouth daily. 90 tablet 2  sucralfate (CARAFATE) 1 g tablet TAKE 1 TABLET (1 G TOTAL) BY MOUTH 4 (FOUR) TIMES DAILY. 360 tablet 3   tirzepatide (MOUNJARO) 12.5 MG/0.5ML Pen Inject 12.5 mg into the skin once a week. 6 mL 2   No current facility-administered medications for this visit.    Allergies  Allergen Reactions   Levothyroxine Rash      Review of Systems negative except from HPI and PMH  Physical Exam BP (!) 120/90 (BP Location: Left Arm, Patient Position: Sitting, Cuff Size: Normal)   Ht '5\' 5"'$  (1.651 m)   Wt 187 lb (84.8 kg)   SpO2 98%   BMI 31.12 kg/m  Well developed and well nourished in no acute distress  HENT normal Neck supple with JVP-flat Clear Device pocket well healed; without hematoma or erythema.  There is no tethering  Regular rate and rhythm, no  murmur Abd-soft with active BS No Clubbing cyanosis   edema Skin-warm and dry A & Oriented  Grossly normal sensory and motor function  ECG sinus at 84 Interval 17/11/38 Nonspecific T wave flattening  Device function is normal. Programming changes    See Paceart for details     Assessment and  Plan NICM  CHF chronic systolic  ICD St Jude  Euvolemic.  Continue Jardiance Entresto Aldactone and carvedilol.  Up titration is limited by her history of orthostatic presyncope and borderline low blood pressures  No interval ventricular tachycardia  Will recheck dig level last checked a year ago elevated, that seems like the dose was decreased from daily to every other day  Reestablish remote  follow-up       Current medicines are reviewed at length with the patient today .  The patient does not  have concerns regarding medicines.

## 2022-10-16 NOTE — Patient Instructions (Signed)
Medication Instructions:  Your physician recommends that you continue on your current medications as directed. Please refer to the Current Medication list given to you today.  *If you need a refill on your cardiac medications before your next appointment, please call your pharmacy*   Lab Work: Your physician recommends that you to get lab work today: Digoxin level Nature conservation officer at [facility] 1st desk on the right to check in (REGISTRATION)  Lab hours: Monday- Friday (7:30 am- 5:30 pm)  If you have labs (blood work) drawn today and your tests are completely normal, you will receive your results only by: MyChart Message (if you have MyChart) OR A paper copy in the mail If you have any lab test that is abnormal or we need to change your treatment, we will call you to review the results.   Testing/Procedures: -None   Follow-Up: At [facility], you and your health needs are our priority.  As part of our continuing mission to provide you with exceptional heart care, we have created designated Provider Care Teams.  These Care Teams include your primary Cardiologist (physician) and Advanced Practice Providers (APPs -  Physician Assistants and Nurse Practitioners) who all work together to provide you with the care you need, when you need it.  We recommend signing up for the patient portal called "MyChart".  Sign up information is provided on this After Visit Summary.  MyChart is used to connect with patients for Virtual Visits (Telemedicine).  Patients are able to view lab/test results, encounter notes, upcoming appointments, etc.  Non-urgent messages can be sent to your provider as well.   To learn more about what you can do with MyChart, go to [email or web address].    Your next appointment:   1 year(s)  Provider:   Virl Axe, MD    Other Instructions -None

## 2022-10-19 ENCOUNTER — Encounter: Payer: Self-pay | Admitting: Internal Medicine

## 2022-10-29 ENCOUNTER — Telehealth: Payer: Self-pay

## 2022-10-29 NOTE — Telephone Encounter (Signed)
Spoke with pt who states as far as she know she is currently taking Digoxin. Pt states her sister is responsible for pt's medications and she will check with her to confirm this medication is included in her current medication regimen and if not she will have Digoxin 0.125mg  - 1 tablet by mouth every other day added back in.

## 2022-10-29 NOTE — Telephone Encounter (Signed)
-----   Message from Deboraha Sprang, MD sent at 10/27/2022 10:40 AM EDT -----  Please inform patient that drug level was undetectable  is she taking her digoxin?

## 2022-11-02 ENCOUNTER — Other Ambulatory Visit: Payer: Self-pay | Admitting: Family Medicine

## 2022-11-02 NOTE — Telephone Encounter (Signed)
Requested Prescriptions  Pending Prescriptions Disp Refills   famotidine (PEPCID) 40 MG tablet [Pharmacy Med Name: FAMOTIDINE 40 MG TABLET] 90 tablet 3    Sig: TAKE 1 TABLET BY MOUTH EVERY DAY     Gastroenterology:  H2 Antagonists Passed - 11/02/2022  2:13 AM      Passed - Valid encounter within last 12 months    Recent Outpatient Visits           2 months ago Type 2 diabetes mellitus with stage 3a chronic kidney disease, with long-term current use of insulin ([place])   [place], Megan P, DO   5 months ago Routine general medical examination at a health care facility   [facility], [place] P, DO   8 months ago Type 2 diabetes mellitus with stage 3a chronic kidney disease, with long-term current use of insulin [facility])   [place], Megan P, DO   11 months ago Benign hypertensive renal disease   [place], [place] P, DO   1 year ago Benign hypertensive renal disease   [place], [place], DO       Future Appointments             In 1 week Wynetta Emery, Barb Merino, DO [place], [facility]

## 2022-11-07 ENCOUNTER — Emergency Department
Admission: EM | Admit: 2022-11-07 | Discharge: 2022-11-08 | Disposition: A | Discharge status: Home / Self Care | Payer: Medicare HMO | Attending: Student in an Organized Health Care Education/Training Program | Admitting: Student in an Organized Health Care Education/Training Program

## 2022-11-07 ENCOUNTER — Encounter: Payer: Self-pay | Admitting: *Deleted

## 2022-11-07 ENCOUNTER — Emergency Department: Payer: Medicare HMO

## 2022-11-07 ENCOUNTER — Other Ambulatory Visit: Payer: Self-pay

## 2022-11-07 DIAGNOSIS — R0789 Other chest pain: Secondary | ICD-10-CM

## 2022-11-07 DIAGNOSIS — J40 Bronchitis, not specified as acute or chronic: Secondary | ICD-10-CM | POA: Diagnosis not present

## 2022-11-07 DIAGNOSIS — R079 Chest pain, unspecified: Secondary | ICD-10-CM | POA: Diagnosis not present

## 2022-11-07 DIAGNOSIS — J9811 Atelectasis: Secondary | ICD-10-CM | POA: Diagnosis not present

## 2022-11-07 LAB — CBC
HCT: 38.9 % (ref 36.0–46.0)
Hemoglobin: 12.3 g/dL (ref 12.0–15.0)
MCH: 29.2 pg (ref 26.0–34.0)
MCHC: 31.6 g/dL (ref 30.0–36.0)
MCV: 92.4 fL (ref 80.0–100.0)
Platelets: 208 10*3/uL (ref 150–400)
RBC: 4.21 MIL/uL (ref 3.87–5.11)
RDW: 13.4 % (ref 11.5–15.5)
WBC: 8.2 10*3/uL (ref 4.0–10.5)
nRBC: 0 % (ref 0.0–0.2)

## 2022-11-07 LAB — BASIC METABOLIC PANEL
Anion gap: 5 (ref 5–15)
BUN: 17 mg/dL (ref 6–20)
CO2: 23 mmol/L (ref 22–32)
Calcium: 9 mg/dL (ref 8.9–10.3)
Chloride: 108 mmol/L (ref 98–111)
Creatinine, Ser: 0.96 mg/dL (ref 0.44–1.00)
GFR, Estimated: >60 mL/min (ref >60)
Glucose, Bld: 136 mg/dL — ABNORMAL HIGH (ref 70–99)
Potassium: 4.3 mmol/L (ref 3.5–5.1)
Sodium: 136 mmol/L (ref 135–145)

## 2022-11-07 LAB — TROPONIN I (HIGH SENSITIVITY)
Troponin I (High Sensitivity): 20 ng/L — ABNORMAL HIGH (ref <18)
Troponin I (High Sensitivity): 20 ng/L — ABNORMAL HIGH (ref <18)

## 2022-11-07 MED ORDER — PREDNISONE 20 MG PO TABS: 40.0000 mg | ORAL_TABLET | Freq: Every day | ORAL | 0 refills | Status: AC

## 2022-11-07 MED ORDER — ALBUTEROL SULFATE HFA 108 (90 BASE) MCG/ACT IN AERS: 2.0000 | INHALATION_SPRAY | Freq: Four times a day (QID) | RESPIRATORY_TRACT | 2 refills | Status: DC | PRN

## 2022-11-07 MED ORDER — IPRATROPIUM-ALBUTEROL 0.5-2.5 (3) MG/3ML IN SOLN
3.0000 mL | Freq: Once | RESPIRATORY_TRACT | Status: AC
  Administered 2022-11-07: 3 mL via RESPIRATORY_TRACT
  Filled 2022-11-07: qty 3

## 2022-11-07 NOTE — ED Triage Notes (Signed)
Pt to triage via wheelchair. Pt reports chest pain for 2 weeks.  Pt states pain radiates into her back.    Pt has a cough and sob.  Pt alert.

## 2022-11-07 NOTE — ED Provider Notes (Signed)
[facility] Provider Note    Event Date/Time   First MD Initiated Contact with Patient 11/07/22 2142     (approximate)   History   Chest Pain   HPI  Bonnie Ochoa is a 57 y.o. female who presents to the ER for evaluation of several weeks of chest discomfort.  States it does radiate through to her back was not sudden onset ripping or tearing.  Has been having some cough and feels like the symptoms started when seasons started changing in the pollen came out.  Has remote history of bronchitis.  Does not smoke.  No productive cough.     Physical Exam   Triage Vital Signs: ED Triage Vitals  Enc Vitals Group     BP 11/07/22 1903 114/69     Pulse Rate 11/07/22 1900 95     Resp 11/07/22 1900 20     Temp 11/07/22 1900 99 F (37.2 C)     Temp Source 11/07/22 1900 Oral     SpO2 11/07/22 1900 99 %     Weight 11/07/22 1901 180 lb (81.6 kg)     Height 11/07/22 1901 5\' 5"  (1.651 m)     Head Circumference --      Peak Flow --      Pain Score 11/07/22 1859 10     Pain Loc --      Pain Edu? --      Excl. in [place]? --     Most recent vital signs: Vitals:   11/07/22 1903 11/07/22 2130  BP: 114/69 124/88  Pulse:  99  Resp:    Temp:    SpO2:  100%     Constitutional: Alert  Eyes: Conjunctivae are normal.  Head: Atraumatic. Nose: No congestion/rhinnorhea. Mouth/Throat: Mucous membranes are moist.   Neck: Painless ROM.  Cardiovascular:   Good peripheral circulation. Respiratory: Normal respiratory effort.  No retractions.  Coarse bibasilar breath sounds. Gastrointestinal: Soft and nontender.  Musculoskeletal:  no deformity Neurologic:  MAE spontaneously. No gross focal neurologic deficits are appreciated.  Skin:  Skin is warm, dry and intact. No rash noted. Psychiatric: Mood and affect are normal. Speech and behavior are normal.    ED Results / Procedures / Treatments   Labs (all labs ordered are listed, but only abnormal results are  displayed) Labs Reviewed  BASIC METABOLIC PANEL - Abnormal; Notable for the following components:      Result Value   Glucose, Bld 136 (*)    All other components within normal limits  TROPONIN I (HIGH SENSITIVITY) - Abnormal; Notable for the following components:   Troponin I (High Sensitivity) 20 (*)    All other components within normal limits  TROPONIN I (HIGH SENSITIVITY) - Abnormal; Notable for the following components:   Troponin I (High Sensitivity) 20 (*)    All other components within normal limits  CBC     EKG  ED ECG REPORT I, Merlyn Lot, the attending physician, personally viewed and interpreted this ECG.   Date: 11/07/2022  EKG Time: 19:07  Rate: 90  Rhythm: sinus  Axis: normal  Intervals: normal  ST&T Change: nonspecific st abn, no stemi    RADIOLOGY Please see ED Course for my review and interpretation.  I personally reviewed all radiographic images ordered to evaluate for the above acute complaints and reviewed radiology reports and findings.  These findings were personally discussed with the patient.  Please see medical record for radiology report.    PROCEDURES:  Critical Care performed: No  Procedures   MEDICATIONS ORDERED IN ED: Medications  ipratropium-albuterol (DUONEB) 0.5-2.5 (3) MG/3ML nebulizer solution 3 mL (3 mLs Nebulization Given 11/07/22 2306)     IMPRESSION / MDM / ASSESSMENT AND PLAN / ED COURSE  I reviewed the triage vital signs and the nursing notes.                              Differential diagnosis includes, but is not limited to, ACS, bronchitis, COPD, pneumonia, pneumothorax, pleurisy, dissection, PE  Patient presenting to the ER for evaluation of symptoms as described above.  Based on symptoms, risk factors and considered above differential, this presenting complaint could reflect a potentially life-threatening illness therefore the patient will be placed on continuous pulse oximetry and telemetry for monitoring.   Laboratory evaluation will be sent to evaluate for the above complaints.  Patient clinically nontoxic-appearing with several weeks of pain.  Her troponins are stable.  EKG nonischemic.  Chest x-ray on my review and interpretation without evidence of consolidation.  Has coarse breath sounds on exam and many of her symptoms seem consistent and suspicious for bronchitis.  Given nebulizer and reassess.  I have a lower suspicion for dissection or PE.   Clinical Course as of 11/07/22 2340  Wed Nov 07, 2022  2311 Patient reassessed with significant improvement after nebulizer treatment.  Her exam is most consistent with bronchitis.  Very low suspicion for PE at this point. [PR]  2339 Given patient's significant improvement after nebulizer I do think this is most consistent with bronchitis.  Likely has some component of seasonal allergies based on history.  Will be placed on steroid as well as albuterol inhaler.  I do believe she stable appropriate for outpatient follow-up. [PR]    Clinical Course User Index [PR] Merlyn Lot, MD     FINAL CLINICAL IMPRESSION(S) / ED DIAGNOSES   Final diagnoses:  Bronchitis  Atypical chest pain     Rx / DC Orders   ED Discharge Orders          Ordered    albuterol (VENTOLIN HFA) 108 (90 Base) MCG/ACT inhaler  Every 6 hours PRN        11/07/22 2340    predniSONE (DELTASONE) 20 MG tablet  Daily        11/07/22 2340             Note:  This document was prepared using Dragon voice recognition software and may include unintentional dictation errors.    Merlyn Lot, MD 11/07/22 [phone]

## 2022-11-13 ENCOUNTER — Telehealth: Payer: Self-pay | Admitting: *Deleted

## 2022-11-13 NOTE — Telephone Encounter (Signed)
        Patient  visited [facility] 12/09/2022  for treatment    Telephone encounter attempt :  1st  No answer   [place] [phone] [address] , [place] [address] Email : [email or web address]

## 2022-11-14 ENCOUNTER — Encounter: Payer: Self-pay | Admitting: Family Medicine

## 2022-11-14 ENCOUNTER — Ambulatory Visit (INDEPENDENT_AMBULATORY_CARE_PROVIDER_SITE_OTHER): Payer: Medicare HMO | Admitting: Family Medicine

## 2022-11-14 VITALS — BP 108/75 | HR 79 | Temp 97.7°F | Ht 65.0 in | Wt 192.4 lb

## 2022-11-14 DIAGNOSIS — E039 Hypothyroidism, unspecified: Secondary | ICD-10-CM | POA: Diagnosis not present

## 2022-11-14 DIAGNOSIS — F3132 Bipolar disorder, current episode depressed, moderate: Secondary | ICD-10-CM

## 2022-11-14 DIAGNOSIS — E1122 Type 2 diabetes mellitus with diabetic chronic kidney disease: Secondary | ICD-10-CM | POA: Diagnosis not present

## 2022-11-14 DIAGNOSIS — N1831 Chronic kidney disease, stage 3a: Secondary | ICD-10-CM | POA: Diagnosis not present

## 2022-11-14 DIAGNOSIS — I129 Hypertensive chronic kidney disease with stage 1 through stage 4 chronic kidney disease, or unspecified chronic kidney disease: Secondary | ICD-10-CM

## 2022-11-14 DIAGNOSIS — Z794 Long term (current) use of insulin: Secondary | ICD-10-CM | POA: Diagnosis not present

## 2022-11-14 LAB — BAYER DCA HB A1C WAIVED: HB A1C (BAYER DCA - WAIVED): 9.3 % — ABNORMAL HIGH (ref 4.8–5.6)

## 2022-11-14 MED ORDER — EMPAGLIFLOZIN 25 MG PO TABS: 25.0000 mg | ORAL_TABLET | Freq: Every day | ORAL | 1 refills | Status: DC

## 2022-11-14 MED ORDER — GABAPENTIN 100 MG PO CAPS: 100.0000 mg | ORAL_CAPSULE | Freq: Every day | ORAL | 1 refills | Status: DC

## 2022-11-14 MED ORDER — MIRTAZAPINE 7.5 MG PO TABS: 7.5000 mg | ORAL_TABLET | Freq: Every day | ORAL | 1 refills | Status: DC

## 2022-11-14 MED ORDER — LANTUS SOLOSTAR 100 UNIT/ML ~~LOC~~ SOPN: 45.0000 [IU] | PEN_INJECTOR | Freq: Every day | SUBCUTANEOUS | 6 refills | Status: DC

## 2022-11-14 MED ORDER — LAMOTRIGINE 25 MG PO TABS: 25.0000 mg | ORAL_TABLET | Freq: Every day | ORAL | 1 refills | Status: DC

## 2022-11-14 MED ORDER — TIRZEPATIDE 15 MG/0.5ML ~~LOC~~ SOAJ: 15.0000 mg | SUBCUTANEOUS | 3 refills | Status: DC

## 2022-11-14 NOTE — Assessment & Plan Note (Signed)
Under good control on current regimen. Continue current regimen. Continue to monitor. Call with any concerns. Refills given. Labs drawn today.   

## 2022-11-14 NOTE — Progress Notes (Signed)
BP 108/75   Pulse 79   Temp 97.7 F (36.5 C) (Oral)   Ht 5\' 5"  (1.651 m)   Wt 192 lb 6.4 oz (87.3 kg)   SpO2 98%   BMI 32.02 kg/m    Subjective:    Patient ID: Bonnie Ochoa, female    DOB: [birth year], 57 y.o.   MRN: [id number]  HPI: Bonnie Ochoa is a 57 y.o. female  Chief Complaint  Patient presents with   Diabetes   DIABETES Hypoglycemic episodes:no Polydipsia/polyuria: no Visual disturbance: no Chest pain: no Paresthesias: no Glucose Monitoring: yes  Accucheck frequency: Daily  Fasting glucose: usually in the 100s Taking Insulin?: yes  Long acting insulin: 45 units Blood Pressure Monitoring: a few times a month Retinal Examination: Up to Date Foot Exam: Up to Date Diabetic Education: Completed Pneumovax: Up to Date Influenza: Up to Date Aspirin: no  HYPERTENSION / [place] Satisfied with current treatment? yes Duration of hypertension: chronic BP monitoring frequency: a few times a month BP medication side effects: no Duration of hyperlipidemia: chronic Cholesterol medication side effects: no Cholesterol supplements: none Past cholesterol medications: crestor Medication compliance: excellent compliance Aspirin: no Recent stressors: no Recurrent headaches: no Visual changes: no Palpitations: no Dyspnea: no Chest pain: no Lower extremity edema: no Dizzy/lightheaded: no  HYPOTHYROIDISM Thyroid control status:controlled Satisfied with current treatment? yes Medication side effects: no Medication compliance: excellent compliance Etiology of hypothyroidism:  Recent dose adjustment:no Fatigue: no Cold intolerance: no Heat intolerance: no Weight gain: no Weight loss: no Constipation: no Diarrhea/loose stools: no Palpitations: no Lower extremity edema: no Anxiety/depressed mood: no  Relevant past medical, surgical, family and social history reviewed and updated as indicated. Interim medical history since our last visit  reviewed. Allergies and medications reviewed and updated.  Review of Systems  Constitutional: Negative.   Respiratory: Negative.    Cardiovascular: Negative.   Gastrointestinal: Negative.   Musculoskeletal: Negative.   Neurological: Negative.   Psychiatric/Behavioral: Negative.      Per HPI unless specifically indicated above     Objective:    BP 108/75   Pulse 79   Temp 97.7 F (36.5 C) (Oral)   Ht 5\' 5"  (1.651 m)   Wt 192 lb 6.4 oz (87.3 kg)   SpO2 98%   BMI 32.02 kg/m   Wt Readings from Last 3 Encounters:  11/14/22 192 lb 6.4 oz (87.3 kg)  11/07/22 180 lb (81.6 kg)  10/16/22 187 lb (84.8 kg)    Physical Exam Vitals and nursing note reviewed.  Constitutional:      General: She is not in acute distress.    Appearance: Normal appearance. She is obese. She is not ill-appearing, toxic-appearing or diaphoretic.  HENT:     Head: Normocephalic and atraumatic.     Right Ear: External ear normal.     Left Ear: External ear normal.     Nose: Nose normal.     Mouth/Throat:     Mouth: Mucous membranes are moist.     Pharynx: Oropharynx is clear.  Eyes:     General: No scleral icterus.       Right eye: No discharge.        Left eye: No discharge.     Extraocular Movements: Extraocular movements intact.     Conjunctiva/sclera: Conjunctivae normal.     Pupils: Pupils are equal, round, and reactive to light.  Cardiovascular:     Rate and Rhythm: Normal rate and regular rhythm.     Pulses: Normal pulses.  Heart sounds: Normal heart sounds. No murmur heard.    No friction rub. No gallop.  Pulmonary:     Effort: Pulmonary effort is normal. No respiratory distress.     Breath sounds: Normal breath sounds. No stridor. No wheezing, rhonchi or rales.  Chest:     Chest wall: No tenderness.  Musculoskeletal:        General: Normal range of motion.     Cervical back: Normal range of motion and neck supple.  Skin:    General: Skin is warm and dry.     Capillary Refill:  Capillary refill takes less than 2 seconds.     Coloration: Skin is not jaundiced or pale.     Findings: No bruising, erythema, lesion or rash.  Neurological:     General: No focal deficit present.     Mental Status: She is alert and oriented to person, place, and time. Mental status is at baseline.  Psychiatric:        Mood and Affect: Mood normal.        Behavior: Behavior normal.        Thought Content: Thought content normal.        Judgment: Judgment normal.     Results for orders placed or performed in visit on 11/14/22  Bayer DCA Hb A1c Waived  Result Value Ref Range   HB A1C (BAYER DCA - WAIVED) 9.3 (H) 4.8 - 5.6 %      Assessment & Plan:   Problem List Items Addressed This Visit       Endocrine   DM (diabetes mellitus), type 2 with renal complications - Primary    Improved with A1c of 9.3, but still elevated. Will increase her mounjaro to 15mg  and recheck 3 months. Continue other medications.       Relevant Medications   empagliflozin (JARDIANCE) 25 MG TABS tablet   insulin glargine (LANTUS SOLOSTAR) 100 UNIT/ML Solostar Pen   tirzepatide (MOUNJARO) 15 MG/0.5ML Pen   Other Relevant Orders   CBC with Differential/Platelet   Bayer DCA Hb A1c Waived (Completed)   Lipid Panel w/o Chol/HDL Ratio   Comprehensive metabolic panel   Hypothyroid    Rechecking labs today. Await results. Treat as needed.       Relevant Orders   CBC with Differential/Platelet   Comprehensive metabolic panel   TSH     Genitourinary   Benign hypertensive renal disease    Under good control on current regimen. Continue current regimen. Continue to monitor. Call with any concerns. Refills given. Labs drawn today.        Relevant Orders   CBC with Differential/Platelet   Comprehensive metabolic panel     Other   Bipolar disorder, current episode depressed, moderate    Under good control on current regimen. Continue current regimen. Continue to monitor. Call with any concerns.  Refills given. Labs drawn today.       Relevant Medications   mirtazapine (REMERON) 7.5 MG tablet   lamoTRIgine (LAMICTAL) 25 MG tablet     Follow up plan: Return in about 3 months (around 02/13/2023).

## 2022-11-14 NOTE — Assessment & Plan Note (Signed)
Rechecking labs today. Await results. Treat as needed.  °

## 2022-11-14 NOTE — Assessment & Plan Note (Signed)
Improved with A1c of 9.3, but still elevated. Will increase her mounjaro to 15mg  and recheck 3 months. Continue other medications.

## 2022-11-15 LAB — COMPREHENSIVE METABOLIC PANEL
ALT: 17 IU/L (ref 0–32)
AST: 8 IU/L (ref 0–40)
Albumin/Globulin Ratio: 1.2 (ref 1.2–2.2)
Albumin: 3.8 g/dL (ref 3.8–4.9)
Alkaline Phosphatase: 89 IU/L (ref 44–121)
BUN/Creatinine Ratio: 23 (ref 9–23)
BUN: 26 mg/dL — ABNORMAL HIGH (ref 6–24)
Bilirubin Total: 0.4 mg/dL (ref 0.0–1.2)
CO2: 19 mmol/L — ABNORMAL LOW (ref 20–29)
Calcium: 9.2 mg/dL (ref 8.7–10.2)
Chloride: 108 mmol/L — ABNORMAL HIGH (ref 96–106)
Creatinine, Ser: 1.11 mg/dL — ABNORMAL HIGH (ref 0.57–1.00)
Globulin, Total: 3.3 g/dL (ref 1.5–4.5)
Glucose: 139 mg/dL — ABNORMAL HIGH (ref 70–99)
Potassium: 4.1 mmol/L (ref 3.5–5.2)
Sodium: 140 mmol/L (ref 134–144)
Total Protein: 7.1 g/dL (ref 6.0–8.5)
eGFR: 58 mL/min/{1.73_m2} — ABNORMAL LOW (ref >59)

## 2022-11-15 LAB — CBC WITH DIFFERENTIAL/PLATELET
Basophils Absolute: 0 10*3/uL (ref 0.0–0.2)
Basos: 0 %
EOS (ABSOLUTE): 0.1 10*3/uL (ref 0.0–0.4)
Eos: 1 %
Hematocrit: 38.5 % (ref 34.0–46.6)
Hemoglobin: 12.6 g/dL (ref 11.1–15.9)
Immature Grans (Abs): 0.1 10*3/uL (ref 0.0–0.1)
Immature Granulocytes: 1 %
Lymphocytes Absolute: 4.9 10*3/uL — ABNORMAL HIGH (ref 0.7–3.1)
Lymphs: 44 %
MCH: 29.6 pg (ref 26.6–33.0)
MCHC: 32.7 g/dL (ref 31.5–35.7)
MCV: 91 fL (ref 79–97)
Monocytes Absolute: 1 10*3/uL — ABNORMAL HIGH (ref 0.1–0.9)
Monocytes: 9 %
Neutrophils Absolute: 5 10*3/uL (ref 1.4–7.0)
Neutrophils: 45 %
Platelets: 230 10*3/uL (ref 150–450)
RBC: 4.25 x10E6/uL (ref 3.77–5.28)
RDW: 13.1 % (ref 11.7–15.4)
WBC: 11.1 10*3/uL — ABNORMAL HIGH (ref 3.4–10.8)

## 2022-11-15 LAB — LIPID PANEL W/O CHOL/HDL RATIO
Cholesterol, Total: 171 mg/dL (ref 100–199)
HDL: 57 mg/dL (ref >39)
LDL Chol Calc (NIH): 77 mg/dL (ref 0–99)
Triglycerides: 227 mg/dL — ABNORMAL HIGH (ref 0–149)
VLDL Cholesterol Cal: 37 mg/dL (ref 5–40)

## 2022-11-15 LAB — TSH: TSH: 2.13 u[IU]/mL (ref 0.450–4.500)

## 2022-12-21 ENCOUNTER — Encounter: Payer: Self-pay | Admitting: Family Medicine

## 2023-01-01 ENCOUNTER — Other Ambulatory Visit: Payer: Self-pay | Admitting: Cardiovascular Disease

## 2023-01-01 ENCOUNTER — Other Ambulatory Visit: Payer: Self-pay | Admitting: Family Medicine

## 2023-01-02 NOTE — Telephone Encounter (Signed)
Requested medication (s) are due for refill today: Ozempic not on medication list  Requested medication (s) are on the active medication list:   Last refill:  Cyclobenzaprine - 12/19/21  Future visit scheduled: Yes  Notes to clinic:  See requests.    Requested Prescriptions  Pending Prescriptions Disp Refills   OZEMPIC, 1 MG/DOSE, 4 MG/3ML SOPN [Pharmacy Med Name: OZEMPIC 4 MG/3 ML (1 MG/DOSE)]  1    Sig: INJECT 1 MG ONCE A WEEK AS DIRECTED     Endocrinology:  Diabetes - GLP-1 Receptor Agonists - semaglutide Failed - 01/01/2023  5:51 PM      Failed - HBA1C in normal range and within 180 days    Hemoglobin A1C  Date Value Ref Range Status  02/19/2018 7.8  Final   HB A1C (BAYER DCA - WAIVED)  Date Value Ref Range Status  11/14/2022 9.3 (H) 4.8 - 5.6 % Final    Comment:             Prediabetes: 5.7 - 6.4          Diabetes: >6.4          Glycemic control for adults with diabetes: <7.0          Failed - Cr in normal range and within 360 days    Creatinine, Ser  Date Value Ref Range Status  11/14/2022 1.11 (H) 0.57 - 1.00 mg/dL Final   Creatinine, Urine  Date Value Ref Range Status  07/02/2017 128 mg/dL Final         Passed - Valid encounter within last 6 months    Recent Outpatient Visits           1 month ago Type 2 diabetes mellitus with stage 3a chronic kidney disease, with long-term current use of insulin (HCC)   [place] [place], Megan P, DO   4 months ago Type 2 diabetes mellitus with stage 3a chronic kidney disease, with long-term current use of insulin (HCC)   [place] [facility] [place], Megan P, DO   7 months ago Routine general medical examination at a health care facility   [facility], [place] P, DO   10 months ago Type 2 diabetes mellitus with stage 3a chronic kidney disease, with long-term current use of insulin (HCC)   [place] [facility] [place], Megan P, DO    1 year ago Benign hypertensive renal disease   [place] [facility] Dorcas Carrow, DO       Future Appointments             In 1 month Johnson, Oralia Rud, DO [place] [facility]             cyclobenzaprine (FLEXERIL) 5 MG tablet [Pharmacy Med Name: CYCLOBENZAPRINE 5 MG TABLET] 60 tablet 1    Sig: TAKE 1 TABLET BY MOUTH THREE TIMES A DAY AS NEEDED FOR MUSCLE SPASMS     Not Delegated - Analgesics:  Muscle Relaxants Failed - 01/01/2023  5:51 PM      Failed - This refill cannot be delegated      Passed - Valid encounter within last 6 months    Recent Outpatient Visits           1 month ago Type 2 diabetes mellitus with stage 3a chronic kidney disease, with long-term current use of insulin (HCC)   [place] [facility] [place], [place], DO  4 months ago Type 2 diabetes mellitus with stage 3a chronic kidney disease, with long-term current use of insulin (HCC)   [place] [facility] [place], Megan P, DO   7 months ago Routine general medical examination at a health care facility   [facility], [place] P, DO   10 months ago Type 2 diabetes mellitus with stage 3a chronic kidney disease, with long-term current use of insulin (HCC)   [place], Megan P, DO   1 year ago Benign hypertensive renal disease   [place] [facility] [place], [place], DO       Future Appointments             In 1 month Johnson, Megan P, DO [place] [facility]            Signed Prescriptions Disp Refills   pantoprazole (PROTONIX) 40 MG tablet 90 tablet 1    Sig: TAKE 1 TABLET BY MOUTH EVERY DAY     Gastroenterology: Proton Pump Inhibitors Passed - 01/01/2023  5:51 PM      Passed - Valid encounter within last 12 months    Recent Outpatient Visits           1 month ago Type 2 diabetes mellitus with stage 3a chronic kidney  disease, with long-term current use of insulin (HCC)   [facility] [place], Megan P, DO   4 months ago Type 2 diabetes mellitus with stage 3a chronic kidney disease, with long-term current use of insulin (HCC)   [place] [facility] [place], Megan P, DO   7 months ago Routine general medical examination at a health care facility   [facility], [place] P, DO   10 months ago Type 2 diabetes mellitus with stage 3a chronic kidney disease, with long-term current use of insulin (HCC)   [place] [facility] [place], Megan P, DO   1 year ago Benign hypertensive renal disease   [place] [facility] Dorcas Carrow, DO       Future Appointments             In 1 month Johnson, Oralia Rud, DO [facility]

## 2023-01-02 NOTE — Telephone Encounter (Signed)
Requested Prescriptions  Pending Prescriptions Disp Refills   OZEMPIC, 1 MG/DOSE, 4 MG/3ML SOPN [Pharmacy Med Name: OZEMPIC 4 MG/3 ML (1 MG/DOSE)]  1    Sig: INJECT 1 MG ONCE A WEEK AS DIRECTED     Endocrinology:  Diabetes - GLP-1 Receptor Agonists - semaglutide Failed - 01/01/2023  5:51 PM      Failed - HBA1C in normal range and within 180 days    Hemoglobin A1C  Date Value Ref Range Status  02/19/2018 7.8  Final   HB A1C (BAYER DCA - WAIVED)  Date Value Ref Range Status  11/14/2022 9.3 (H) 4.8 - 5.6 % Final    Comment:             Prediabetes: 5.7 - 6.4          Diabetes: >6.4          Glycemic control for adults with diabetes: <7.0          Failed - Cr in normal range and within 360 days    Creatinine, Ser  Date Value Ref Range Status  11/14/2022 1.11 (H) 0.57 - 1.00 mg/dL Final   Creatinine, Urine  Date Value Ref Range Status  07/02/2017 128 mg/dL Final         Passed - Valid encounter within last 6 months    Recent Outpatient Visits           1 month ago Type 2 diabetes mellitus with stage 3a chronic kidney disease, with long-term current use of insulin (HCC)   [place] [facility] [place], Megan P, DO   4 months ago Type 2 diabetes mellitus with stage 3a chronic kidney disease, with long-term current use of insulin (HCC)   [place] [facility] [place], Megan P, DO   7 months ago Routine general medical examination at a health care facility   [facility], [place] P, DO   10 months ago Type 2 diabetes mellitus with stage 3a chronic kidney disease, with long-term current use of insulin (HCC)   [place] [facility] [place], Megan P, DO   1 year ago Benign hypertensive renal disease   [place] [place], [place], DO       Future Appointments             In 1 month Johnson, Megan P, DO [place] [facility]             cyclobenzaprine  (FLEXERIL) 5 MG tablet [Pharmacy Med Name: CYCLOBENZAPRINE 5 MG TABLET] 60 tablet 1    Sig: TAKE 1 TABLET BY MOUTH THREE TIMES A DAY AS NEEDED FOR MUSCLE SPASMS     Not Delegated - Analgesics:  Muscle Relaxants Failed - 01/01/2023  5:51 PM      Failed - This refill cannot be delegated      Passed - Valid encounter within last 6 months    Recent Outpatient Visits           1 month ago Type 2 diabetes mellitus with stage 3a chronic kidney disease, with long-term current use of insulin (HCC)   [place] [facility] [place], Megan P, DO   4 months ago Type 2 diabetes mellitus with stage 3a chronic kidney disease, with long-term current use of insulin (HCC)   [place] [facility] [place], Megan P, DO   7 months ago Routine general medical examination at a health care  facility   [facility], [place] P, DO   10 months ago Type 2 diabetes mellitus with stage 3a chronic kidney disease, with long-term current use of insulin (HCC)   [place] [facility] [place], Megan P, DO   1 year ago Benign hypertensive renal disease   [facility] [place], Megan P, DO       Future Appointments             In 1 month Johnson, Megan P, DO [place] [facility]             pantoprazole (PROTONIX) 40 MG tablet [Pharmacy Med Name: PANTOPRAZOLE SOD DR 40 MG TAB] 90 tablet 1    Sig: TAKE 1 TABLET BY MOUTH EVERY DAY     Gastroenterology: Proton Pump Inhibitors Passed - 01/01/2023  5:51 PM      Passed - Valid encounter within last 12 months    Recent Outpatient Visits           1 month ago Type 2 diabetes mellitus with stage 3a chronic kidney disease, with long-term current use of insulin (HCC)   [facility] [place], Megan P, DO   4 months ago Type 2 diabetes mellitus with stage 3a chronic kidney disease, with long-term current use of insulin (HCC)   [facility] [place], Megan P, DO   7 months ago Routine general medical examination at a health care facility   [facility], [place] P, DO   10 months ago Type 2 diabetes mellitus with stage 3a chronic kidney disease, with long-term current use of insulin (HCC)   [place] [place], Megan P, DO   1 year ago Benign hypertensive renal disease   [place] [facility] Dorcas Carrow, DO       Future Appointments             In 1 month Johnson, Oralia Rud, DO  [facility]

## 2023-01-04 ENCOUNTER — Other Ambulatory Visit: Payer: Self-pay

## 2023-01-04 ENCOUNTER — Emergency Department
Admission: EM | Admit: 2023-01-04 | Discharge: 2023-01-05 | Disposition: A | Discharge status: Home / Self Care | Payer: Medicare HMO | Attending: Emergency Medicine | Admitting: Emergency Medicine

## 2023-01-04 DIAGNOSIS — I110 Hypertensive heart disease with heart failure: Secondary | ICD-10-CM | POA: Insufficient documentation

## 2023-01-04 DIAGNOSIS — I509 Heart failure, unspecified: Secondary | ICD-10-CM | POA: Diagnosis not present

## 2023-01-04 DIAGNOSIS — R112 Nausea with vomiting, unspecified: Secondary | ICD-10-CM | POA: Diagnosis not present

## 2023-01-04 DIAGNOSIS — I959 Hypotension, unspecified: Secondary | ICD-10-CM | POA: Insufficient documentation

## 2023-01-04 DIAGNOSIS — E860 Dehydration: Secondary | ICD-10-CM

## 2023-01-04 DIAGNOSIS — R531 Weakness: Secondary | ICD-10-CM

## 2023-01-04 DIAGNOSIS — R42 Dizziness and giddiness: Secondary | ICD-10-CM | POA: Insufficient documentation

## 2023-01-04 DIAGNOSIS — N289 Disorder of kidney and ureter, unspecified: Secondary | ICD-10-CM | POA: Insufficient documentation

## 2023-01-04 DIAGNOSIS — E119 Type 2 diabetes mellitus without complications: Secondary | ICD-10-CM | POA: Diagnosis not present

## 2023-01-04 LAB — BASIC METABOLIC PANEL
Anion gap: 8 (ref 5–15)
BUN: 33 mg/dL — ABNORMAL HIGH (ref 6–20)
CO2: 23 mmol/L (ref 22–32)
Calcium: 9.1 mg/dL (ref 8.9–10.3)
Chloride: 104 mmol/L (ref 98–111)
Creatinine, Ser: 1.78 mg/dL — ABNORMAL HIGH (ref 0.44–1.00)
GFR, Estimated: 32 mL/min — ABNORMAL LOW (ref >60)
Glucose, Bld: 172 mg/dL — ABNORMAL HIGH (ref 70–99)
Potassium: 4.5 mmol/L (ref 3.5–5.1)
Sodium: 135 mmol/L (ref 135–145)

## 2023-01-04 LAB — URINALYSIS, ROUTINE W REFLEX MICROSCOPIC
Bacteria, UA: NONE SEEN
Bilirubin Urine: NEGATIVE
Glucose, UA: >=500 mg/dL — AB
Hgb urine dipstick: NEGATIVE
Ketones, ur: NEGATIVE mg/dL
Leukocytes,Ua: NEGATIVE
Nitrite: NEGATIVE
Protein, ur: NEGATIVE mg/dL
Specific Gravity, Urine: 1.021 (ref 1.005–1.030)
pH: 5 (ref 5.0–8.0)

## 2023-01-04 LAB — CBC
HCT: 39.9 % (ref 36.0–46.0)
Hemoglobin: 12.3 g/dL (ref 12.0–15.0)
MCH: 28.5 pg (ref 26.0–34.0)
MCHC: 30.8 g/dL (ref 30.0–36.0)
MCV: 92.6 fL (ref 80.0–100.0)
Platelets: 208 10*3/uL (ref 150–400)
RBC: 4.31 MIL/uL (ref 3.87–5.11)
RDW: 13.3 % (ref 11.5–15.5)
WBC: 6.8 10*3/uL (ref 4.0–10.5)
nRBC: 0 % (ref 0.0–0.2)

## 2023-01-04 LAB — HEPATIC FUNCTION PANEL
ALT: 18 U/L (ref 0–44)
AST: 15 U/L (ref 15–41)
Albumin: 4.2 g/dL (ref 3.5–5.0)
Alkaline Phosphatase: 71 U/L (ref 38–126)
Bilirubin, Direct: 0.1 mg/dL (ref 0.0–0.2)
Indirect Bilirubin: 1 mg/dL — ABNORMAL HIGH (ref 0.3–0.9)
Total Bilirubin: 1.1 mg/dL (ref 0.3–1.2)
Total Protein: 8.3 g/dL — ABNORMAL HIGH (ref 6.5–8.1)

## 2023-01-04 LAB — TROPONIN I (HIGH SENSITIVITY): Troponin I (High Sensitivity): 14 ng/L (ref <18)

## 2023-01-04 MED ORDER — SODIUM CHLORIDE 0.9 % IV BOLUS
1000.0000 mL | Freq: Once | INTRAVENOUS | Status: AC
  Administered 2023-01-04: 1000 mL via INTRAVENOUS

## 2023-01-04 MED ORDER — ONDANSETRON HCL 4 MG/2ML IJ SOLN
4.0000 mg | Freq: Once | INTRAMUSCULAR | Status: AC
  Administered 2023-01-04: 4 mg via INTRAVENOUS
  Filled 2023-01-04: qty 2

## 2023-01-04 NOTE — ED Provider Notes (Signed)
[facility] Provider Note    Event Date/Time   First MD Initiated Contact with Patient 01/04/23 1945     (approximate)  History   Chief Complaint: Hypotension  HPI  Bonnie Ochoa is a 57 y.o. female with a past medical history of anemia, bipolar, CHF, diabetes, hypertension, cardiomyopathy, presents to the emergency department for low blood pressure and nausea vomiting.  According to the patient for the past 2 days she has been nauseated with intermittent episodes of vomiting.  Denies any diarrhea.  She is also been feeling dizzy/weak at times and noted that her blood pressure has been low as low as 70/40 at home so the patient came to the emergency department.  Here upon arrival patient's initial blood pressure 81/60, on recheck during my evaluation 91 systolic without intervention as of yet.  Patient denies any abdominal pain denies any chest pain or shortness of breath.  No recent fever cough congestion.  Physical Exam   Triage Vital Signs: ED Triage Vitals  Enc Vitals Group     BP 01/04/23 1919 (!) 81/60     Pulse Rate 01/04/23 1919 75     Resp 01/04/23 1919 16     Temp 01/04/23 1924 98 F (36.7 C)     Temp src --      SpO2 01/04/23 1919 95 %     Weight 01/04/23 1919 186 lb (84.4 kg)     Height 01/04/23 1919 5\' 5"  (1.651 m)     Head Circumference --      Peak Flow --      Pain Score 01/04/23 1919 0     Pain Loc --      Pain Edu? --      Excl. in GC? --     Most recent vital signs: Vitals:   01/04/23 1919 01/04/23 1924  BP: (!) 81/60   Pulse: 75   Resp: 16   Temp:  98 F (36.7 C)  SpO2: 95%     General: Awake, no distress.  CV:  Good peripheral perfusion.  Regular rate and rhythm  Resp:  Normal effort.  Equal breath sounds bilaterally.  Abd:  No distention.  Soft, nontender.  No rebound or guarding.  ED Results / Procedures / Treatments   EKG  EKG viewed and interpreted by myself shows a normal sinus rhythm at 70 bpm with a narrow  QRS, normal axis, normal intervals with nonspecific ST changes.  MEDICATIONS ORDERED IN ED: Medications  ondansetron (ZOFRAN) injection 4 mg (has no administration in time range)  sodium chloride 0.9 % bolus 1,000 mL (has no administration in time range)     IMPRESSION / MDM / ASSESSMENT AND PLAN / ED COURSE  I reviewed the triage vital signs and the nursing notes.  Patient's presentation is most consistent with acute presentation with potential threat to life or bodily function.  Patient presents emergency department for 2 days of dizziness/weakness occasional episodes of vomiting and found to be hypotensive at home 70/40 per patient.  Here patient is hypotensive 81/60 upon arrival currently 91 systolic without intervention.  We will start the patient on IV fluid she does have a history of cardiomyopathy we will gently hydrate.  We will check labs including cardiac enzymes chemistry CBC and a urinalysis.  Will continue to closely monitor.  Will treat nausea as well although the patient is currently lying in bed eating snacks with no nausea at this time.  Patient's workup shows mild renal  insufficiency with a creatinine of 1.78, with a baseline closer to 1.1.  Patient has received 2 L of fluids.  Patient is feeling much better, remainder of the workup is reassuring with a normal CBC, negative troponin, normal LFTs.  Patient's urinalysis shows no infection.  Given the patient's reassuring workup I believe the patient would be safe for discharge home with nausea medication.  Blood pressure is improved and the patient feels better.  FINAL CLINICAL IMPRESSION(S) / ED DIAGNOSES   Hypotension Nausea Dizziness   Note:  This document was prepared using Dragon voice recognition software and may include unintentional dictation errors.   Minna Antis, MD 01/04/23 2211

## 2023-01-04 NOTE — Discharge Instructions (Addendum)
As we discussed please drink plenty of fluids over the next several days.  Please do not take your blood pressure medication tomorrow as this could be contributing to your lower blood pressure.  You may restart this medication on Sunday.  Please take your nausea medication if needed for any further nausea or vomiting.  Please follow-up with your doctor Monday or Tuesday for recheck/reevaluation.  Return to the emergency department for any further weakness or if you have any further low blood pressure readings at home.

## 2023-01-04 NOTE — ED Triage Notes (Signed)
Pt to ED from home for low BP. Pt has been checking it at home and it has been reading 70/40 with some dizziness. Pt is CAOx4, in no acute distress and in wheelchair.  Pt is having vomiting as well.

## 2023-01-11 ENCOUNTER — Telehealth: Payer: Self-pay

## 2023-01-11 NOTE — Telephone Encounter (Signed)
Transition Care Management Unsuccessful Follow-up Telephone Call  Date of discharge and from where:  01/05/2023 [facility]  Attempts:  1st Attempt  Reason for unsuccessful TCM follow-up call:  No answer/busy  Schuyler Behan [facility]  [facility] Community Resource Care Guide   [email or web address]  ?? [phone]   Website: [email or web address]  [email or web address]

## 2023-01-14 ENCOUNTER — Telehealth: Payer: Self-pay

## 2023-01-14 NOTE — Telephone Encounter (Signed)
Transition Care Management Unsuccessful Follow-up Telephone Call  Date of discharge and from where:  01/06/2023 [facility]  Attempts:  2nd Attempt  Reason for unsuccessful TCM follow-up call:  Left voice message  Izabela Ow [facility]  [facility] Population Health Community Resource Care Guide   [email or web address]  ?? [phone]   Website: [email or web address]  [email or web address]

## 2023-02-13 ENCOUNTER — Ambulatory Visit (INDEPENDENT_AMBULATORY_CARE_PROVIDER_SITE_OTHER): Payer: Medicare HMO | Admitting: Family Medicine

## 2023-02-13 ENCOUNTER — Encounter: Payer: Self-pay | Admitting: Family Medicine

## 2023-02-13 VITALS — BP 129/89 | HR 73 | Temp 97.6°F | Wt 187.8 lb

## 2023-02-13 DIAGNOSIS — Z794 Long term (current) use of insulin: Secondary | ICD-10-CM

## 2023-02-13 DIAGNOSIS — R252 Cramp and spasm: Secondary | ICD-10-CM

## 2023-02-13 DIAGNOSIS — N1831 Chronic kidney disease, stage 3a: Secondary | ICD-10-CM | POA: Diagnosis not present

## 2023-02-13 DIAGNOSIS — E1122 Type 2 diabetes mellitus with diabetic chronic kidney disease: Secondary | ICD-10-CM | POA: Diagnosis not present

## 2023-02-13 LAB — BAYER DCA HB A1C WAIVED: HB A1C (BAYER DCA - WAIVED): 7.2 % — ABNORMAL HIGH (ref 4.8–5.6)

## 2023-02-13 NOTE — Assessment & Plan Note (Signed)
Doing great with A1c of 7.3 down from 9.3! Continue current regimen. Continue to monitor. Call with any concerns.

## 2023-02-13 NOTE — Progress Notes (Signed)
BP 129/89   Pulse 73   Temp 97.6 F (36.4 C) (Oral)   Wt 187 lb 12.8 oz (85.2 kg)   SpO2 98%   BMI 31.25 kg/m    Subjective:    Patient ID: Bonnie Ochoa, female    DOB: June 10, 1966, 57 y.o.   MRN: [id number]  HPI: Bonnie Ochoa is a 57 y.o. female  Chief Complaint  Patient presents with   finger sticking    Pt states fingers has been sticking together the last couple of months has to pop them to get relief    Diabetes   DIABETES Hypoglycemic episodes:no Polydipsia/polyuria: no Visual disturbance: no Chest pain: no Paresthesias: no Glucose Monitoring: no Taking Insulin?: no Blood Pressure Monitoring: not checking Retinal Examination: Up to Date Foot Exam: Up to Date Diabetic Education: Completed Pneumovax: Up to Date Influenza: Up to Date Aspirin: yes  Has been having lateral finger spasms for the past couple of months. No other symptoms- not sure what's causing it.   Relevant past medical, surgical, family and social history reviewed and updated as indicated. Interim medical history since our last visit reviewed. Allergies and medications reviewed and updated.  Review of Systems  Constitutional: Negative.   Respiratory: Negative.    Cardiovascular: Negative.   Gastrointestinal: Negative.   Musculoskeletal:  Positive for myalgias. Negative for arthralgias, back pain, gait problem, joint swelling, neck pain and neck stiffness.  Skin: Negative.   Psychiatric/Behavioral: Negative.      Per HPI unless specifically indicated above     Objective:    BP 129/89   Pulse 73   Temp 97.6 F (36.4 C) (Oral)   Wt 187 lb 12.8 oz (85.2 kg)   SpO2 98%   BMI 31.25 kg/m   Wt Readings from Last 3 Encounters:  02/13/23 187 lb 12.8 oz (85.2 kg)  01/04/23 186 lb (84.4 kg)  11/14/22 192 lb 6.4 oz (87.3 kg)    Physical Exam Vitals and nursing note reviewed.  Constitutional:      General: She is not in acute distress.    Appearance: Normal appearance. She is not  ill-appearing, toxic-appearing or diaphoretic.  HENT:     Head: Normocephalic and atraumatic.     Right Ear: External ear normal.     Left Ear: External ear normal.     Nose: Nose normal.     Mouth/Throat:     Mouth: Mucous membranes are moist.     Pharynx: Oropharynx is clear.  Eyes:     General: No scleral icterus.       Right eye: No discharge.        Left eye: No discharge.     Extraocular Movements: Extraocular movements intact.     Conjunctiva/sclera: Conjunctivae normal.     Pupils: Pupils are equal, round, and reactive to light.  Cardiovascular:     Rate and Rhythm: Normal rate and regular rhythm.     Pulses: Normal pulses.     Heart sounds: Normal heart sounds. No murmur heard.    No friction rub. No gallop.  Pulmonary:     Effort: Pulmonary effort is normal. No respiratory distress.     Breath sounds: Normal breath sounds. No stridor. No wheezing, rhonchi or rales.  Chest:     Chest wall: No tenderness.  Musculoskeletal:        General: Normal range of motion.     Cervical back: Normal range of motion and neck supple.  Skin:    General: Skin  is warm and dry.     Capillary Refill: Capillary refill takes less than 2 seconds.     Coloration: Skin is not jaundiced or pale.     Findings: No bruising, erythema, lesion or rash.  Neurological:     General: No focal deficit present.     Mental Status: She is alert and oriented to person, place, and time. Mental status is at baseline.  Psychiatric:        Mood and Affect: Mood normal.        Behavior: Behavior normal.        Thought Content: Thought content normal.        Judgment: Judgment normal.     Results for orders placed or performed during the hospital encounter of 01/04/23  Basic metabolic panel  Result Value Ref Range   Sodium 135 135 - 145 mmol/L   Potassium 4.5 3.5 - 5.1 mmol/L   Chloride 104 98 - 111 mmol/L   CO2 23 22 - 32 mmol/L   Glucose, Bld 172 (H) 70 - 99 mg/dL   BUN 33 (H) 6 - 20 mg/dL    Creatinine, Ser 1.61 (H) 0.44 - 1.00 mg/dL   Calcium 9.1 8.9 - 09.6 mg/dL   GFR, Estimated 32 (L) >60 mL/min   Anion gap 8 5 - 15  CBC  Result Value Ref Range   WBC 6.8 4.0 - 10.5 K/uL   RBC 4.31 3.87 - 5.11 MIL/uL   Hemoglobin 12.3 12.0 - 15.0 g/dL   HCT 04.5 40.9 - 81.1 %   MCV 92.6 80.0 - 100.0 fL   MCH 28.5 26.0 - 34.0 pg   MCHC 30.8 30.0 - 36.0 g/dL   RDW 91.4 78.2 - 95.6 %   Platelets 208 150 - 400 K/uL   nRBC 0.0 0.0 - 0.2 %  Urinalysis, Routine w reflex microscopic -Urine, Clean Catch  Result Value Ref Range   Color, Urine YELLOW (A) YELLOW   APPearance HAZY (A) CLEAR   Specific Gravity, Urine 1.021 1.005 - 1.030   pH 5.0 5.0 - 8.0   Glucose, UA >=500 (A) NEGATIVE mg/dL   Hgb urine dipstick NEGATIVE NEGATIVE   Bilirubin Urine NEGATIVE NEGATIVE   Ketones, ur NEGATIVE NEGATIVE mg/dL   Protein, ur NEGATIVE NEGATIVE mg/dL   Nitrite NEGATIVE NEGATIVE   Leukocytes,Ua NEGATIVE NEGATIVE   RBC / HPF 0-5 0 - 5 RBC/hpf   WBC, UA 0-5 0 - 5 WBC/hpf   Bacteria, UA NONE SEEN NONE SEEN   Squamous Epithelial / HPF 0-5 0 - 5 /HPF   Hyaline Casts, UA PRESENT   Hepatic function panel  Result Value Ref Range   Total Protein 8.3 (H) 6.5 - 8.1 g/dL   Albumin 4.2 3.5 - 5.0 g/dL   AST 15 15 - 41 U/L   ALT 18 0 - 44 U/L   Alkaline Phosphatase 71 38 - 126 U/L   Total Bilirubin 1.1 0.3 - 1.2 mg/dL   Bilirubin, Direct 0.1 0.0 - 0.2 mg/dL   Indirect Bilirubin 1.0 (H) 0.3 - 0.9 mg/dL  Troponin I (High Sensitivity)  Result Value Ref Range   Troponin I (High Sensitivity) 14 <18 ng/L      Assessment & Plan:   Problem List Items Addressed This Visit       Endocrine   DM (diabetes mellitus), type 2 with renal complications (HCC) - Primary    Doing great with A1c of 7.3 down from 9.3! Continue current regimen. Continue to  monitor. Call with any concerns.       Relevant Orders   Bayer DCA Hb A1c Waived   Comprehensive metabolic panel   Magnesium   Phosphorus   Other Visit Diagnoses      Spasms of the hands or feet       Will check labs. Await results. Treat as needed.        Follow up plan: Return in about 3 months (around 05/16/2023).

## 2023-02-14 LAB — COMPREHENSIVE METABOLIC PANEL
ALT: 14 IU/L (ref 0–32)
AST: 12 IU/L (ref 0–40)
Albumin: 4.3 g/dL (ref 3.8–4.9)
Alkaline Phosphatase: 81 IU/L (ref 44–121)
BUN/Creatinine Ratio: 19 (ref 9–23)
BUN: 21 mg/dL (ref 6–24)
Bilirubin Total: 0.6 mg/dL (ref 0.0–1.2)
CO2: 22 mmol/L (ref 20–29)
Calcium: 9.6 mg/dL (ref 8.7–10.2)
Chloride: 107 mmol/L — ABNORMAL HIGH (ref 96–106)
Creatinine, Ser: 1.13 mg/dL — ABNORMAL HIGH (ref 0.57–1.00)
Globulin, Total: 3.4 g/dL (ref 1.5–4.5)
Glucose: 126 mg/dL — ABNORMAL HIGH (ref 70–99)
Potassium: 4.4 mmol/L (ref 3.5–5.2)
Sodium: 142 mmol/L (ref 134–144)
Total Protein: 7.7 g/dL (ref 6.0–8.5)
eGFR: 56 mL/min/{1.73_m2} — ABNORMAL LOW (ref >59)

## 2023-02-14 LAB — MAGNESIUM: Magnesium: 2.4 mg/dL — ABNORMAL HIGH (ref 1.6–2.3)

## 2023-02-14 LAB — PHOSPHORUS: Phosphorus: 4.4 mg/dL — ABNORMAL HIGH (ref 3.0–4.3)

## 2023-05-02 ENCOUNTER — Other Ambulatory Visit: Payer: Self-pay | Admitting: Family Medicine

## 2023-05-02 DIAGNOSIS — F3132 Bipolar disorder, current episode depressed, moderate: Secondary | ICD-10-CM

## 2023-05-03 NOTE — Telephone Encounter (Signed)
Requested Prescriptions  Pending Prescriptions Disp Refills   lamoTRIgine (LAMICTAL) 25 MG tablet [Pharmacy Med Name: LAMOTRIGINE 25 MG TABLET] 90 tablet 2    Sig: TAKE 1 TABLET (25 MG TOTAL) BY MOUTH DAILY.     Neurology:  Anticonvulsants - lamotrigine Failed - 05/02/2023  2:39 AM      Failed - Cr in normal range and within 360 days    Creatinine, Ser  Date Value Ref Range Status  02/13/2023 1.13 (H) 0.57 - 1.00 mg/dL Final   Creatinine, Urine  Date Value Ref Range Status  07/02/2017 128 mg/dL Final         Passed - ALT in normal range and within 360 days    ALT  Date Value Ref Range Status  02/13/2023 14 0 - 32 IU/L Final         Passed - AST in normal range and within 360 days    AST  Date Value Ref Range Status  02/13/2023 12 0 - 40 IU/L Final         Passed - Completed PHQ-2 or PHQ-9 in the last 360 days      Passed - Valid encounter within last 12 months    Recent Outpatient Visits           2 months ago Type 2 diabetes mellitus with stage 3a chronic kidney disease, with long-term current use of insulin (HCC)   [place] [facility] [place], Megan P, DO   5 months ago Type 2 diabetes mellitus with stage 3a chronic kidney disease, with long-term current use of insulin (HCC)   [place] [facility] [place], Megan P, DO   8 months ago Type 2 diabetes mellitus with stage 3a chronic kidney disease, with long-term current use of insulin (HCC)   [facility] [place], Megan P, DO   11 months ago Routine general medical examination at a health care facility   [facility] [place], [place] P, DO   1 year ago Type 2 diabetes mellitus with stage 3a chronic kidney disease, with long-term current use of insulin (HCC)   [place] [facility] [place], Oralia Rud, DO       Future Appointments             In 4 days Dorcas Carrow, DO [place] [facility]   In 1 week  [place], Oralia Rud, DO [place] [facility]

## 2023-05-07 ENCOUNTER — Encounter: Payer: Self-pay | Admitting: Family Medicine

## 2023-05-07 ENCOUNTER — Ambulatory Visit (INDEPENDENT_AMBULATORY_CARE_PROVIDER_SITE_OTHER): Payer: Medicare HMO | Admitting: Family Medicine

## 2023-05-07 VITALS — BP 99/69 | HR 74 | Wt 185.0 lb

## 2023-05-07 DIAGNOSIS — G4486 Cervicogenic headache: Secondary | ICD-10-CM | POA: Diagnosis not present

## 2023-05-07 DIAGNOSIS — Z23 Encounter for immunization: Secondary | ICD-10-CM | POA: Diagnosis not present

## 2023-05-07 DIAGNOSIS — D170 Benign lipomatous neoplasm of skin and subcutaneous tissue of head, face and neck: Secondary | ICD-10-CM

## 2023-05-07 MED ORDER — KETOROLAC TROMETHAMINE 60 MG/2ML IM SOLN
60.0000 mg | Freq: Once | INTRAMUSCULAR | Status: AC
Start: 2023-05-07 — End: 2023-05-07
  Administered 2023-05-07: 60 mg via INTRAMUSCULAR

## 2023-05-07 MED ORDER — BACLOFEN 10 MG PO TABS: 5.0000 mg | ORAL_TABLET | Freq: Every evening | ORAL | 0 refills | Status: DC | PRN

## 2023-05-07 NOTE — Progress Notes (Signed)
BP 99/69   Pulse 74   Wt 185 lb (83.9 kg)   SpO2 98%   BMI 30.79 kg/m    Subjective:    Patient ID: Bonnie Ochoa, female    DOB: 01-01-1966, 57 y.o.   MRN: [id number]  HPI: Bonnie Ochoa is a 57 y.o. female  Chief Complaint  Patient presents with   Cyst    Patient says she has felt 2-3 knots on the back of her head.    Headache    Patient says she has had a headache for the past two weeks. Patient says she has a history of blood pressure up and down. Patient says the pain is consistent. Patient says she has tried Ibuprofen, but has not had much relief. Patient declines having any issues with vision when she has the headaches.    HEADACHE Duration: 2 weeks Onset: gradual Severity: severe Quality: aching Frequency: constant Location: R back side of her head and goes to the top of her R side of her head Headache duration: constant Radiation: top of her head  Alleviating factors: ibuprofen  Aggravating factors: turning her neck Headache status at time of visit: current headache Treatments attempted: ibuprofen, tylenol   Aura: no Nausea:  no Vomiting: no Photophobia:  no Phonophobia:  no Effect on social functioning:  no Confusion:  no Gait disturbance/ataxia:  no Behavioral changes:  no Fevers:  no  Relevant past medical, surgical, family and social history reviewed and updated as indicated. Interim medical history since our last visit reviewed. Allergies and medications reviewed and updated.  Review of Systems  Constitutional: Negative.   Respiratory: Negative.    Cardiovascular: Negative.   Gastrointestinal: Negative.   Musculoskeletal: Negative.   Neurological:  Positive for headaches. Negative for dizziness, tremors, seizures, syncope, facial asymmetry, speech difficulty, weakness, light-headedness and numbness.  Psychiatric/Behavioral: Negative.      Per HPI unless specifically indicated above     Objective:    BP 99/69   Pulse 74   Wt 185 lb (83.9  kg)   SpO2 98%   BMI 30.79 kg/m   Wt Readings from Last 3 Encounters:  05/07/23 185 lb (83.9 kg)  02/13/23 187 lb 12.8 oz (85.2 kg)  01/04/23 186 lb (84.4 kg)    Physical Exam Vitals and nursing note reviewed.  Constitutional:      General: She is not in acute distress.    Appearance: Normal appearance. She is not ill-appearing, toxic-appearing or diaphoretic.  HENT:     Head: Normocephalic and atraumatic.     Right Ear: External ear normal.     Left Ear: External ear normal.     Nose: Nose normal.     Mouth/Throat:     Mouth: Mucous membranes are moist.     Pharynx: Oropharynx is clear.  Eyes:     General: No scleral icterus.       Right eye: No discharge.        Left eye: No discharge.     Extraocular Movements: Extraocular movements intact.     Conjunctiva/sclera: Conjunctivae normal.     Pupils: Pupils are equal, round, and reactive to light.  Cardiovascular:     Rate and Rhythm: Normal rate and regular rhythm.     Pulses: Normal pulses.     Heart sounds: Normal heart sounds. No murmur heard.    No friction rub. No gallop.  Pulmonary:     Effort: Pulmonary effort is normal. No respiratory distress.  Breath sounds: Normal breath sounds. No stridor. No wheezing, rhonchi or rales.  Chest:     Chest wall: No tenderness.  Musculoskeletal:        General: Normal range of motion.     Cervical back: Normal range of motion and neck supple.  Skin:    General: Skin is warm and dry.     Capillary Refill: Capillary refill takes less than 2 seconds.     Coloration: Skin is not jaundiced or pale.     Findings: No bruising, erythema, lesion or rash.     Comments: 2 small soft, mobile lumps on r side of posterior head  Neurological:     General: No focal deficit present.     Mental Status: She is alert and oriented to person, place, and time. Mental status is at baseline.  Psychiatric:        Mood and Affect: Mood normal.        Behavior: Behavior normal.        Thought  Content: Thought content normal.        Judgment: Judgment normal.     Results for orders placed or performed in visit on 02/13/23  Bayer DCA Hb A1c Waived  Result Value Ref Range   HB A1C (BAYER DCA - WAIVED) 7.2 (H) 4.8 - 5.6 %  Comprehensive metabolic panel  Result Value Ref Range   Glucose 126 (H) 70 - 99 mg/dL   BUN 21 6 - 24 mg/dL   Creatinine, Ser 1.61 (H) 0.57 - 1.00 mg/dL   eGFR 56 (L) >09 UE/AVW/0.98   BUN/Creatinine Ratio 19 9 - 23   Sodium 142 134 - 144 mmol/L   Potassium 4.4 3.5 - 5.2 mmol/L   Chloride 107 (H) 96 - 106 mmol/L   CO2 22 20 - 29 mmol/L   Calcium 9.6 8.7 - 10.2 mg/dL   Total Protein 7.7 6.0 - 8.5 g/dL   Albumin 4.3 3.8 - 4.9 g/dL   Globulin, Total 3.4 1.5 - 4.5 g/dL   Bilirubin Total 0.6 0.0 - 1.2 mg/dL   Alkaline Phosphatase 81 44 - 121 IU/L   AST 12 0 - 40 IU/L   ALT 14 0 - 32 IU/L  Magnesium  Result Value Ref Range   Magnesium 2.4 (H) 1.6 - 2.3 mg/dL  Phosphorus  Result Value Ref Range   Phosphorus 4.4 (H) 3.0 - 4.3 mg/dL      Assessment & Plan:   Problem List Items Addressed This Visit   None Visit Diagnoses     Cervicogenic headache    -  Primary   Will treat with toradol, baclofen and stretches. Call if not getting better or getting worse.   Relevant Medications   baclofen (LIORESAL) 10 MG tablet   ketorolac (TORADOL) injection 60 mg (Start on 05/07/2023  9:00 AM)   Lipoma of head       Reassured patient. No infection. Continue to monitor.   Flu vaccine need       Flu shot given today.   Relevant Orders   Flu vaccine trivalent PF, 6mos and older(Flulaval,Afluria,Fluarix,Fluzone)   Need for COVID-19 vaccine       COVID shot given today.   Relevant Orders   Pfizer Comirnaty Covid -19 Vaccine 81yrs and older        Follow up plan: Return as scheduled.

## 2023-05-16 ENCOUNTER — Ambulatory Visit (INDEPENDENT_AMBULATORY_CARE_PROVIDER_SITE_OTHER): Payer: Medicare HMO | Admitting: Family Medicine

## 2023-05-16 ENCOUNTER — Encounter: Payer: Self-pay | Admitting: Family Medicine

## 2023-05-16 VITALS — BP 122/84 | HR 76 | Ht 65.0 in | Wt 189.0 lb

## 2023-05-16 DIAGNOSIS — Z794 Long term (current) use of insulin: Secondary | ICD-10-CM

## 2023-05-16 DIAGNOSIS — E1122 Type 2 diabetes mellitus with diabetic chronic kidney disease: Secondary | ICD-10-CM | POA: Diagnosis not present

## 2023-05-16 DIAGNOSIS — I129 Hypertensive chronic kidney disease with stage 1 through stage 4 chronic kidney disease, or unspecified chronic kidney disease: Secondary | ICD-10-CM | POA: Diagnosis not present

## 2023-05-16 DIAGNOSIS — F3132 Bipolar disorder, current episode depressed, moderate: Secondary | ICD-10-CM | POA: Diagnosis not present

## 2023-05-16 DIAGNOSIS — Z1231 Encounter for screening mammogram for malignant neoplasm of breast: Secondary | ICD-10-CM | POA: Diagnosis not present

## 2023-05-16 DIAGNOSIS — N1831 Chronic kidney disease, stage 3a: Secondary | ICD-10-CM | POA: Diagnosis not present

## 2023-05-16 LAB — MICROALBUMIN, URINE WAIVED
Creatinine, Urine Waived: 100 mg/dL (ref 10–300)
Microalb, Ur Waived: 150 mg/L — ABNORMAL HIGH (ref 0–19)

## 2023-05-16 MED ORDER — ALBUTEROL SULFATE HFA 108 (90 BASE) MCG/ACT IN AERS: 2.0000 | INHALATION_SPRAY | Freq: Four times a day (QID) | RESPIRATORY_TRACT | 2 refills | Status: DC | PRN

## 2023-05-16 MED ORDER — EMPAGLIFLOZIN 25 MG PO TABS: 25.0000 mg | ORAL_TABLET | Freq: Every day | ORAL | 1 refills | Status: DC

## 2023-05-16 MED ORDER — MIRTAZAPINE 7.5 MG PO TABS: 7.5000 mg | ORAL_TABLET | Freq: Every day | ORAL | 1 refills | Status: DC

## 2023-05-16 MED ORDER — PANTOPRAZOLE SODIUM 40 MG PO TBEC: 40.0000 mg | DELAYED_RELEASE_TABLET | Freq: Every day | ORAL | 1 refills | Status: DC

## 2023-05-16 MED ORDER — GABAPENTIN 100 MG PO CAPS: 100.0000 mg | ORAL_CAPSULE | Freq: Every day | ORAL | 1 refills | Status: DC

## 2023-05-16 MED ORDER — LANTUS SOLOSTAR 100 UNIT/ML ~~LOC~~ SOPN: 45.0000 [IU] | PEN_INJECTOR | Freq: Every day | SUBCUTANEOUS | 6 refills | Status: DC

## 2023-05-16 NOTE — Patient Instructions (Addendum)
Patient is scheduled for Screening Mammogram at [facility] on October 10th at 1:20 PM. If patient has any question regarding her appointment. Patient may reach out to their office directly at [phone].

## 2023-05-16 NOTE — Assessment & Plan Note (Signed)
Rechecking labs today. Await results. Treat as needed.  °

## 2023-05-16 NOTE — Assessment & Plan Note (Signed)
Under good control on current regimen. Continue current regimen. Continue to monitor. Call with any concerns. Refills given. Labs drawn today.   

## 2023-05-16 NOTE — Progress Notes (Signed)
BP 122/84   Pulse 76   Ht 5\' 5"  (1.651 m)   Wt 189 lb (85.7 kg)   SpO2 99%   BMI 31.45 kg/m    Subjective:    Patient ID: Bonnie Ochoa, female    DOB: 06/05/1966, 57 y.o.   MRN: [id number]  HPI: Bonnie Ochoa is a 57 y.o. female  Chief Complaint  Patient presents with   Diabetes   DIABETES Hypoglycemic episodes:no Polydipsia/polyuria: no Visual disturbance: no Chest pain: no Paresthesias: no Glucose Monitoring: yes Taking Insulin?: yes Blood Pressure Monitoring: not checking Retinal Examination: Up to Date Foot Exam: Up to Date Diabetic Education: Completed Pneumovax: Up to Date Influenza: Up to Date Aspirin: yes  HYPERTENSION / HYPERLIPIDEMIA Satisfied with current treatment? yes Duration of hypertension: chronic BP monitoring frequency: rarely BP medication side effects: no Duration of hyperlipidemia: chronic Cholesterol medication side effects: yes Cholesterol supplements: none Past cholesterol medications: crestor Medication compliance: excellent compliance Aspirin: no Recent stressors: no Recurrent headaches: no Visual changes: no Palpitations: no Dyspnea: no Chest pain: no Lower extremity edema: no Dizzy/lightheaded: no  DEPRESSION Mood status: controlled Satisfied with current treatment?: yes Symptom severity: mild  Duration of current treatment : chronic Side effects: no Medication compliance: excellent compliance Psychotherapy/counseling: no  Previous psychiatric medications: lamictal, remeron Depressed mood: no Anxious mood: no Anhedonia: no Significant weight loss or gain: no Insomnia: no  Fatigue: no Feelings of worthlessness or guilt: no Impaired concentration/indecisiveness: no Suicidal ideations: no Hopelessness: no Crying spells: no    05/16/2023   10:04 AM 02/13/2023    8:39 AM 11/14/2022    8:35 AM 10/15/2022    8:55 AM 08/15/2022    8:45 AM  Depression screen PHQ 2/9  Decreased Interest 1 1 1  0 1  Down, Depressed,  Hopeless 1 2 1  0 1  PHQ - 2 Score 2 3 2  0 2  Altered sleeping 0 0 0 0 1  Tired, decreased energy 2 2 1  0 1  Change in appetite 0 2 0 0 0  Feeling bad or failure about yourself  1 2 1  0 1  Trouble concentrating 1 2 0 0 1  Moving slowly or fidgety/restless 0 0 0 0 0  Suicidal thoughts 0 1 0 0 0  PHQ-9 Score 6 12 4  0 6  Difficult doing work/chores  Not difficult at all Somewhat difficult Not difficult at all Somewhat difficult    Relevant past medical, surgical, family and social history reviewed and updated as indicated. Interim medical history since our last visit reviewed. Allergies and medications reviewed and updated.  Review of Systems  Constitutional: Negative.   Respiratory: Negative.    Cardiovascular: Negative.   Gastrointestinal: Negative.   Musculoskeletal: Negative.   Psychiatric/Behavioral: Negative.      Per HPI unless specifically indicated above     Objective:    BP 122/84   Pulse 76   Ht 5\' 5"  (1.651 m)   Wt 189 lb (85.7 kg)   SpO2 99%   BMI 31.45 kg/m   Wt Readings from Last 3 Encounters:  05/16/23 189 lb (85.7 kg)  05/07/23 185 lb (83.9 kg)  02/13/23 187 lb 12.8 oz (85.2 kg)    Physical Exam Vitals and nursing note reviewed.  Constitutional:      General: She is not in acute distress.    Appearance: Normal appearance. She is obese. She is not ill-appearing, toxic-appearing or diaphoretic.  HENT:     Head: Normocephalic and atraumatic.  Right Ear: External ear normal.     Left Ear: External ear normal.     Nose: Nose normal.     Mouth/Throat:     Mouth: Mucous membranes are moist.     Pharynx: Oropharynx is clear.  Eyes:     General: No scleral icterus.       Right eye: No discharge.        Left eye: No discharge.     Extraocular Movements: Extraocular movements intact.     Conjunctiva/sclera: Conjunctivae normal.     Pupils: Pupils are equal, round, and reactive to light.  Cardiovascular:     Rate and Rhythm: Normal rate and regular  rhythm.     Pulses: Normal pulses.     Heart sounds: Normal heart sounds. No murmur heard.    No friction rub. No gallop.  Pulmonary:     Effort: Pulmonary effort is normal. No respiratory distress.     Breath sounds: Normal breath sounds. No stridor. No wheezing, rhonchi or rales.  Chest:     Chest wall: No tenderness.  Musculoskeletal:        General: Normal range of motion.     Cervical back: Normal range of motion and neck supple.  Skin:    General: Skin is warm and dry.     Capillary Refill: Capillary refill takes less than 2 seconds.     Coloration: Skin is not jaundiced or pale.     Findings: No bruising, erythema, lesion or rash.  Neurological:     General: No focal deficit present.     Mental Status: She is alert and oriented to person, place, and time. Mental status is at baseline.  Psychiatric:        Mood and Affect: Mood normal.        Behavior: Behavior normal.        Thought Content: Thought content normal.        Judgment: Judgment normal.     Results for orders placed or performed in visit on 02/13/23  Bayer DCA Hb A1c Waived  Result Value Ref Range   HB A1C (BAYER DCA - WAIVED) 7.2 (H) 4.8 - 5.6 %  Comprehensive metabolic panel  Result Value Ref Range   Glucose 126 (H) 70 - 99 mg/dL   BUN 21 6 - 24 mg/dL   Creatinine, Ser 3.29 (H) 0.57 - 1.00 mg/dL   eGFR 56 (L) >51 OA/CZY/6.06   BUN/Creatinine Ratio 19 9 - 23   Sodium 142 134 - 144 mmol/L   Potassium 4.4 3.5 - 5.2 mmol/L   Chloride 107 (H) 96 - 106 mmol/L   CO2 22 20 - 29 mmol/L   Calcium 9.6 8.7 - 10.2 mg/dL   Total Protein 7.7 6.0 - 8.5 g/dL   Albumin 4.3 3.8 - 4.9 g/dL   Globulin, Total 3.4 1.5 - 4.5 g/dL   Bilirubin Total 0.6 0.0 - 1.2 mg/dL   Alkaline Phosphatase 81 44 - 121 IU/L   AST 12 0 - 40 IU/L   ALT 14 0 - 32 IU/L  Magnesium  Result Value Ref Range   Magnesium 2.4 (H) 1.6 - 2.3 mg/dL  Phosphorus  Result Value Ref Range   Phosphorus 4.4 (H) 3.0 - 4.3 mg/dL      Assessment &  Plan:   Problem List Items Addressed This Visit       Endocrine   DM (diabetes mellitus), type 2 with renal complications (HCC) - Primary    Rechecking  labs today. Await results. Treat as needed.       Relevant Medications   insulin glargine (LANTUS SOLOSTAR) 100 UNIT/ML Solostar Pen   empagliflozin (JARDIANCE) 25 MG TABS tablet   Other Relevant Orders   Hgb A1c w/o eAG   Comprehensive metabolic panel   CBC with Differential/Platelet   Lipid Panel w/o Chol/HDL Ratio   Microalbumin, Urine Waived     Genitourinary   Benign hypertensive renal disease    Under good control on current regimen. Continue current regimen. Continue to monitor. Call with any concerns. Refills given. Labs drawn today.         Other   Bipolar disorder, current episode depressed, moderate (HCC)    Under good control on current regimen. Continue current regimen. Continue to monitor. Call with any concerns. Refills given. Labs drawn today.      Relevant Medications   mirtazapine (REMERON) 7.5 MG tablet   Other Visit Diagnoses     Encounter for screening mammogram for malignant neoplasm of breast       Mammogram ordered today   Relevant Orders   MM 3D SCREENING MAMMOGRAM BILATERAL BREAST        Follow up plan: Return in about 3 months (around 08/16/2023) for physical, before the end of the year please.

## 2023-05-17 LAB — CBC WITH DIFFERENTIAL/PLATELET
Basophils Absolute: 0.1 10*3/uL (ref 0.0–0.2)
Basos: 1 %
EOS (ABSOLUTE): 0.2 10*3/uL (ref 0.0–0.4)
Eos: 4 %
Hematocrit: 36.5 % (ref 34.0–46.6)
Hemoglobin: 11.8 g/dL (ref 11.1–15.9)
Immature Grans (Abs): 0 10*3/uL (ref 0.0–0.1)
Immature Granulocytes: 0 %
Lymphocytes Absolute: 2.5 10*3/uL (ref 0.7–3.1)
Lymphs: 48 %
MCH: 30.6 pg (ref 26.6–33.0)
MCHC: 32.3 g/dL (ref 31.5–35.7)
MCV: 95 fL (ref 79–97)
Monocytes Absolute: 0.5 10*3/uL (ref 0.1–0.9)
Monocytes: 9 %
Neutrophils Absolute: 2 10*3/uL (ref 1.4–7.0)
Neutrophils: 38 %
Platelets: 187 10*3/uL (ref 150–450)
RBC: 3.85 x10E6/uL (ref 3.77–5.28)
RDW: 13 % (ref 11.7–15.4)
WBC: 5.3 10*3/uL (ref 3.4–10.8)

## 2023-05-17 LAB — COMPREHENSIVE METABOLIC PANEL
ALT: 11 [IU]/L (ref 0–32)
AST: 12 [IU]/L (ref 0–40)
Albumin: 4.2 g/dL (ref 3.8–4.9)
Alkaline Phosphatase: 71 [IU]/L (ref 44–121)
BUN/Creatinine Ratio: 24 — ABNORMAL HIGH (ref 9–23)
BUN: 33 mg/dL — ABNORMAL HIGH (ref 6–24)
Bilirubin Total: 0.3 mg/dL (ref 0.0–1.2)
CO2: 21 mmol/L (ref 20–29)
Calcium: 9.3 mg/dL (ref 8.7–10.2)
Chloride: 106 mmol/L (ref 96–106)
Creatinine, Ser: 1.4 mg/dL — ABNORMAL HIGH (ref 0.57–1.00)
Globulin, Total: 3.5 g/dL (ref 1.5–4.5)
Glucose: 173 mg/dL — ABNORMAL HIGH (ref 70–99)
Potassium: 4.5 mmol/L (ref 3.5–5.2)
Sodium: 140 mmol/L (ref 134–144)
Total Protein: 7.7 g/dL (ref 6.0–8.5)
eGFR: 43 mL/min/{1.73_m2} — ABNORMAL LOW (ref >59)

## 2023-05-17 LAB — HGB A1C W/O EAG: Hgb A1c MFr Bld: 6.9 % — ABNORMAL HIGH (ref 4.8–5.6)

## 2023-05-17 LAB — LIPID PANEL W/O CHOL/HDL RATIO
Cholesterol, Total: 148 mg/dL (ref 100–199)
HDL: 38 mg/dL — ABNORMAL LOW (ref >39)
LDL Chol Calc (NIH): 77 mg/dL (ref 0–99)
Triglycerides: 199 mg/dL — ABNORMAL HIGH (ref 0–149)
VLDL Cholesterol Cal: 33 mg/dL (ref 5–40)

## 2023-05-23 ENCOUNTER — Ambulatory Visit
Admission: RE | Admit: 2023-05-23 | Discharge: 2023-05-23 | Disposition: A | Discharge status: Home / Self Care | Payer: Medicare HMO | Source: Ambulatory Visit | Attending: Family Medicine | Admitting: Family Medicine

## 2023-05-23 DIAGNOSIS — Z1231 Encounter for screening mammogram for malignant neoplasm of breast: Secondary | ICD-10-CM | POA: Insufficient documentation

## 2023-05-30 ENCOUNTER — Other Ambulatory Visit: Payer: Self-pay | Admitting: Family Medicine

## 2023-05-30 NOTE — Telephone Encounter (Signed)
Requested medications are due for refill today.  A little too soon  Requested medications are on the active medications list.  yes  Last refill. 05/07/2023 #30 0 rf  Future visit scheduled.   yes  Notes to clinic.  Please review for refill pt is requesting 90 day supply.    Requested Prescriptions  Pending Prescriptions Disp Refills   baclofen (LIORESAL) 10 MG tablet [Pharmacy Med Name: BACLOFEN 10 MG TABLET] 90 tablet 1    Sig: TAKE 1/2 TO 1 TABLET (5-10 MG TOTAL) BY MOUTH EVERY DAY AT BEDTIME AS NEEDED FOR MUSCLE SPASM     Analgesics:  Muscle Relaxants - baclofen Failed - 05/30/2023 12:34 PM      Failed - Cr in normal range and within 180 days    Creatinine, Ser  Date Value Ref Range Status  05/16/2023 1.40 (H) 0.57 - 1.00 mg/dL Final   Creatinine, Urine  Date Value Ref Range Status  07/02/2017 128 mg/dL Final         Passed - eGFR is 30 or above and within 180 days    GFR calc Af Amer  Date Value Ref Range Status  07/11/2020 60 >59 mL/min/1.73 Final    Comment:    **In accordance with recommendations from the NKF-ASN Task force,**   [facility] is in the process of updating its eGFR calculation to the   2021 CKD-EPI creatinine equation that estimates kidney function   without a race variable.    GFR, Estimated  Date Value Ref Range Status  01/04/2023 32 (L) >60 mL/min Final    Comment:    (NOTE) Calculated using the CKD-EPI Creatinine Equation (2021)    eGFR  Date Value Ref Range Status  05/16/2023 43 (L) >59 mL/min/1.73 Final         Passed - Valid encounter within last 6 months    Recent Outpatient Visits           2 weeks ago Type 2 diabetes mellitus with stage 3a chronic kidney disease, with long-term current use of insulin (HCC)   [place] [facility] [place], Megan P, DO   3 weeks ago Cervicogenic headache   [place] [facility] [place], Megan P, DO   3 months ago Type 2 diabetes mellitus with stage 3a chronic kidney  disease, with long-term current use of insulin (HCC)   [place] [facility] [place], Megan P, DO   6 months ago Type 2 diabetes mellitus with stage 3a chronic kidney disease, with long-term current use of insulin (HCC)   [place] [facility] [place], Megan P, DO   9 months ago Type 2 diabetes mellitus with stage 3a chronic kidney disease, with long-term current use of insulin (HCC)   [facility] [place], Oralia Rud, DO       Future Appointments             In 2 months Laural Benes, Oralia Rud, DO  [facility]

## 2023-06-13 ENCOUNTER — Other Ambulatory Visit: Payer: Self-pay | Admitting: Family Medicine

## 2023-06-13 NOTE — Telephone Encounter (Signed)
Requested Prescriptions  Pending Prescriptions Disp Refills   baclofen (LIORESAL) 10 MG tablet [Pharmacy Med Name: BACLOFEN 10 MG TABLET] 30 tablet 0    Sig: TAKE 1/2 TO 1 TABLET (5-10 MG TOTAL) BY MOUTH EVERY DAY AT BEDTIME AS NEEDED FOR MUSCLE SPASM     Analgesics:  Muscle Relaxants - baclofen Failed - 06/13/2023  4:05 AM      Failed - Cr in normal range and within 180 days    Creatinine, Ser  Date Value Ref Range Status  05/16/2023 1.40 (H) 0.57 - 1.00 mg/dL Final   Creatinine, Urine  Date Value Ref Range Status  07/02/2017 128 mg/dL Final         Passed - eGFR is 30 or above and within 180 days    GFR calc Af Amer  Date Value Ref Range Status  07/11/2020 60 >59 mL/min/1.73 Final    Comment:    **In accordance with recommendations from the NKF-ASN Task force,**   [facility] is in the process of updating its eGFR calculation to the   2021 CKD-EPI creatinine equation that estimates kidney function   without a race variable.    GFR, Estimated  Date Value Ref Range Status  01/04/2023 32 (L) >60 mL/min Final    Comment:    (NOTE) Calculated using the CKD-EPI Creatinine Equation (2021)    eGFR  Date Value Ref Range Status  05/16/2023 43 (L) >59 mL/min/1.73 Final         Passed - Valid encounter within last 6 months    Recent Outpatient Visits           4 weeks ago Type 2 diabetes mellitus with stage 3a chronic kidney disease, with long-term current use of insulin (HCC)   [place] [facility] [place], Bonnie P, DO   1 month ago Cervicogenic headache   [place] [facility] [place], Bonnie P, DO   4 months ago Type 2 diabetes mellitus with stage 3a chronic kidney disease, with long-term current use of insulin (HCC)   [place] [facility] [place], Bonnie P, DO   7 months ago Type 2 diabetes mellitus with stage 3a chronic kidney disease, with long-term current use of insulin (HCC)   [place] [facility] [place],  Bonnie P, DO   10 months ago Type 2 diabetes mellitus with stage 3a chronic kidney disease, with long-term current use of insulin (HCC)   [place] [facility] [place], [place], DO       Future Appointments             In 2 months Laural Benes, Oralia Rud, DO [place] [facility]             cyclobenzaprine (FLEXERIL) 5 MG tablet [Pharmacy Med Name: CYCLOBENZAPRINE 5 MG TABLET] 60 tablet 1    Sig: TAKE 1 TABLET BY MOUTH THREE TIMES A DAY AS NEEDED FOR MUSCLE SPASM     Not Delegated - Analgesics:  Muscle Relaxants Failed - 06/13/2023  4:05 AM      Failed - This refill cannot be delegated      Passed - Valid encounter within last 6 months    Recent Outpatient Visits           4 weeks ago Type 2 diabetes mellitus with stage 3a chronic kidney disease, with long-term current use of insulin (HCC)   [place], Bonnie P, DO   1 month ago  Cervicogenic headache   [place] [facility] [place], [place] P, DO   4 months ago Type 2 diabetes mellitus with stage 3a chronic kidney disease, with long-term current use of insulin (HCC)   [place] [facility] [place], Bonnie P, DO   7 months ago Type 2 diabetes mellitus with stage 3a chronic kidney disease, with long-term current use of insulin (HCC)   [place] [facility], Bonnie P, DO   10 months ago Type 2 diabetes mellitus with stage 3a chronic kidney disease, with long-term current use of insulin (HCC)   [place] [facility] [place], Oralia Rud, DO       Future Appointments             In 2 months Laural Benes, Oralia Rud, DO [facility]

## 2023-06-13 NOTE — Telephone Encounter (Signed)
Requested medications are due for refill today.  yes  Requested medications are on the active medications list.  yes  Last refill. 01/02/2023 #60 1 rf  Future visit scheduled.   yes  Notes to clinic.  Refill not delegated.    Requested Prescriptions  Pending Prescriptions Disp Refills   cyclobenzaprine (FLEXERIL) 5 MG tablet [Pharmacy Med Name: CYCLOBENZAPRINE 5 MG TABLET] 60 tablet 1    Sig: TAKE 1 TABLET BY MOUTH THREE TIMES A DAY AS NEEDED FOR MUSCLE SPASM     Not Delegated - Analgesics:  Muscle Relaxants Failed - 06/13/2023  4:05 AM      Failed - This refill cannot be delegated      Passed - Valid encounter within last 6 months    Recent Outpatient Visits           4 weeks ago Type 2 diabetes mellitus with stage 3a chronic kidney disease, with long-term current use of insulin (HCC)   [place] [facility] [place], Megan P, DO   1 month ago Cervicogenic headache   [place] [facility] [place], Megan P, DO   4 months ago Type 2 diabetes mellitus with stage 3a chronic kidney disease, with long-term current use of insulin (HCC)   [place] [facility] [place], Megan P, DO   7 months ago Type 2 diabetes mellitus with stage 3a chronic kidney disease, with long-term current use of insulin (HCC)   [place] [facility] [place], Megan P, DO   10 months ago Type 2 diabetes mellitus with stage 3a chronic kidney disease, with long-term current use of insulin (HCC)   [place] [facility] [place], [place], DO       Future Appointments             In 2 months [place], Megan P, DO [place] [facility]            Signed Prescriptions Disp Refills   baclofen (LIORESAL) 10 MG tablet 30 tablet 0    Sig: TAKE 1/2 TO 1 TABLET (5-10 MG TOTAL) BY MOUTH EVERY DAY AT BEDTIME AS NEEDED FOR MUSCLE SPASM     Analgesics:  Muscle Relaxants - baclofen Failed - 06/13/2023  4:05 AM      Failed -  Cr in normal range and within 180 days    Creatinine, Ser  Date Value Ref Range Status  05/16/2023 1.40 (H) 0.57 - 1.00 mg/dL Final   Creatinine, Urine  Date Value Ref Range Status  07/02/2017 128 mg/dL Final         Passed - eGFR is 30 or above and within 180 days    GFR calc Af Amer  Date Value Ref Range Status  07/11/2020 60 >59 mL/min/1.73 Final    Comment:    **In accordance with recommendations from the NKF-ASN Task force,**   [facility] is in the process of updating its eGFR calculation to the   2021 CKD-EPI creatinine equation that estimates kidney function   without a race variable.    GFR, Estimated  Date Value Ref Range Status  01/04/2023 32 (L) >60 mL/min Final    Comment:    (NOTE) Calculated using the CKD-EPI Creatinine Equation (2021)    eGFR  Date Value Ref Range Status  05/16/2023 43 (L) >59 mL/min/1.73 Final         Passed - Valid encounter within last 6 months    Recent Outpatient Visits  4 weeks ago Type 2 diabetes mellitus with stage 3a chronic kidney disease, with long-term current use of insulin (HCC)   [place] [facility] [place], Megan P, DO   1 month ago Cervicogenic headache   [place] [facility] [place], Megan P, DO   4 months ago Type 2 diabetes mellitus with stage 3a chronic kidney disease, with long-term current use of insulin (HCC)   [place] [facility], Megan P, DO   7 months ago Type 2 diabetes mellitus with stage 3a chronic kidney disease, with long-term current use of insulin (HCC)   [place] [facility], Megan P, DO   10 months ago Type 2 diabetes mellitus with stage 3a chronic kidney disease, with long-term current use of insulin (HCC)   [place] [facility] [place], Oralia Rud, DO       Future Appointments             In 2 months Laural Benes, Oralia Rud, DO [place] [facility]

## 2023-06-20 ENCOUNTER — Other Ambulatory Visit: Payer: Self-pay | Admitting: Cardiovascular Disease

## 2023-06-20 ENCOUNTER — Other Ambulatory Visit: Payer: Self-pay | Admitting: Family Medicine

## 2023-06-21 NOTE — Telephone Encounter (Signed)
Requested Prescriptions  Refused Prescriptions Disp Refills   baclofen (LIORESAL) 10 MG tablet [Pharmacy Med Name: BACLOFEN 10 MG TABLET] 30 tablet 0    Sig: TAKE 1/2 TO 1 TABLET (5-10 MG TOTAL) BY MOUTH EVERY DAY AT BEDTIME AS NEEDED FOR MUSCLE SPASM     Analgesics:  Muscle Relaxants - baclofen Failed - 06/20/2023  6:21 PM      Failed - Cr in normal range and within 180 days    Creatinine, Ser  Date Value Ref Range Status  05/16/2023 1.40 (H) 0.57 - 1.00 mg/dL Final   Creatinine, Urine  Date Value Ref Range Status  07/02/2017 128 mg/dL Final         Passed - eGFR is 30 or above and within 180 days    GFR calc Af Amer  Date Value Ref Range Status  07/11/2020 60 >59 mL/min/1.73 Final    Comment:    **In accordance with recommendations from the NKF-ASN Task force,**   [facility] is in the process of updating its eGFR calculation to the   2021 CKD-EPI creatinine equation that estimates kidney function   without a race variable.    GFR, Estimated  Date Value Ref Range Status  01/04/2023 32 (L) >60 mL/min Final    Comment:    (NOTE) Calculated using the CKD-EPI Creatinine Equation (2021)    eGFR  Date Value Ref Range Status  05/16/2023 43 (L) >59 mL/min/1.73 Final         Passed - Valid encounter within last 6 months    Recent Outpatient Visits           1 month ago Type 2 diabetes mellitus with stage 3a chronic kidney disease, with long-term current use of insulin (HCC)   [facility] [place], Megan P, DO   1 month ago Cervicogenic headache   [place] [facility] [place], Megan P, DO   4 months ago Type 2 diabetes mellitus with stage 3a chronic kidney disease, with long-term current use of insulin (HCC)   [facility] [place], Megan P, DO   7 months ago Type 2 diabetes mellitus with stage 3a chronic kidney disease, with long-term current use of insulin (HCC)   [place] [facility] [place],  Megan P, DO   10 months ago Type 2 diabetes mellitus with stage 3a chronic kidney disease, with long-term current use of insulin (HCC)   [facility] [place], Oralia Rud, DO       Future Appointments             In 1 month Johnson, Oralia Rud, DO  [facility]

## 2023-06-26 ENCOUNTER — Other Ambulatory Visit: Payer: Self-pay | Admitting: Cardiovascular Disease

## 2023-06-27 MED ORDER — DIGOXIN 125 MCG PO TABS: ORAL_TABLET | ORAL | 0 refills | Status: DC

## 2023-06-27 NOTE — Addendum Note (Signed)
Addended by: Kendrick Fries on: 06/27/2023 03:16 PM   Modules accepted: Orders

## 2023-07-31 ENCOUNTER — Other Ambulatory Visit: Payer: Self-pay | Admitting: Cardiovascular Disease

## 2023-08-13 ENCOUNTER — Encounter: Payer: Self-pay | Admitting: Family Medicine

## 2023-08-13 ENCOUNTER — Ambulatory Visit (INDEPENDENT_AMBULATORY_CARE_PROVIDER_SITE_OTHER): Payer: Medicare HMO | Admitting: Family Medicine

## 2023-08-13 VITALS — BP 118/76 | HR 77 | Ht 64.0 in | Wt 184.8 lb

## 2023-08-13 DIAGNOSIS — E039 Hypothyroidism, unspecified: Secondary | ICD-10-CM

## 2023-08-13 DIAGNOSIS — Z794 Long term (current) use of insulin: Secondary | ICD-10-CM

## 2023-08-13 DIAGNOSIS — I5022 Chronic systolic (congestive) heart failure: Secondary | ICD-10-CM | POA: Diagnosis not present

## 2023-08-13 DIAGNOSIS — I129 Hypertensive chronic kidney disease with stage 1 through stage 4 chronic kidney disease, or unspecified chronic kidney disease: Secondary | ICD-10-CM

## 2023-08-13 DIAGNOSIS — Z0000 Encounter for general adult medical examination without abnormal findings: Secondary | ICD-10-CM | POA: Diagnosis not present

## 2023-08-13 DIAGNOSIS — N1831 Chronic kidney disease, stage 3a: Secondary | ICD-10-CM

## 2023-08-13 DIAGNOSIS — F3132 Bipolar disorder, current episode depressed, moderate: Secondary | ICD-10-CM

## 2023-08-13 DIAGNOSIS — K7469 Other cirrhosis of liver: Secondary | ICD-10-CM | POA: Diagnosis not present

## 2023-08-13 DIAGNOSIS — E1122 Type 2 diabetes mellitus with diabetic chronic kidney disease: Secondary | ICD-10-CM | POA: Diagnosis not present

## 2023-08-13 LAB — BAYER DCA HB A1C WAIVED: HB A1C (BAYER DCA - WAIVED): 8.9 % — ABNORMAL HIGH (ref 4.8–5.6)

## 2023-08-13 LAB — MICROALBUMIN, URINE WAIVED
Creatinine, Urine Waived: 100 mg/dL (ref 10–300)
Microalb, Ur Waived: 150 mg/L — ABNORMAL HIGH (ref 0–19)
Microalb/Creat Ratio: >300 mg/g — ABNORMAL HIGH (ref <30)

## 2023-08-13 MED ORDER — DICLOFENAC SODIUM 1 % EX GEL: 4.0000 g | Freq: Four times a day (QID) | CUTANEOUS | 3 refills | Status: DC

## 2023-08-13 NOTE — Progress Notes (Signed)
 BP 118/76   Pulse 77   Ht 5' 4 (1.626 m)   Wt 184 lb 12.8 oz (83.8 kg)   SpO2 99%   BMI 31.72 kg/m    Subjective:    Patient ID: Bonnie Ochoa, female    DOB: 10-04-1965, 57 y.o.   MRN: [id number]  HPI: Bonnie Ochoa is a 57 y.o. female presenting on 08/13/2023 for comprehensive medical examination. Current medical complaints include:  DIABETES Hypoglycemic episodes:no Polydipsia/polyuria: no Visual disturbance: no Chest pain: no Paresthesias: yes Glucose Monitoring: yes  Accucheck frequency:  occasionally Taking Insulin ?: yes Blood Pressure Monitoring: rarely Retinal Examination: Up to Date Foot Exam: Up to Date Diabetic Education: Completed Pneumovax: Up to Date Influenza: Up to Date Aspirin : no  HYPERTENSION / HYPERLIPIDEMIA Satisfied with current treatment? yes Duration of hypertension: chronic BP monitoring frequency: not checking BP medication side effects: no Past BP meds: lasix , digoxin , entresto , spironalactone, carvedilol  Duration of hyperlipidemia: chronic Cholesterol medication side effects: no Cholesterol supplements: none Past cholesterol medications: crestor  Medication compliance: excellent compliance Aspirin : no Recent stressors: no Recurrent headaches: no Visual changes: no Palpitations: no Dyspnea: no Chest pain: no Lower extremity edema: no Dizzy/lightheaded: no  DEPRESSION Mood status: controlled Satisfied with current treatment?: no Symptom severity: mild  Duration of current treatment : chronic Side effects: no Medication compliance: excellent compliance Psychotherapy/counseling: no  Previous psychiatric medications: lamictal , remeron  Depressed mood: no Anxious mood: no Anhedonia: no Significant weight loss or gain: no Insomnia: no  Fatigue: no Feelings of worthlessness or guilt: no Impaired concentration/indecisiveness: no Suicidal ideations: no Hopelessness: no Crying spells: no    08/13/2023   10:57 AM 05/16/2023    10:04 AM 02/13/2023    8:39 AM 11/14/2022    8:35 AM 10/15/2022    8:55 AM  Depression screen PHQ 2/9  Decreased Interest 2 1 1 1  0  Down, Depressed, Hopeless 2 1 2 1  0  PHQ - 2 Score 4 2 3 2  0  Altered sleeping 0 0 0 0 0  Tired, decreased energy 0 2 2 1  0  Change in appetite 0 0 2 0 0  Feeling bad or failure about yourself  2 1 2 1  0  Trouble concentrating 0 1 2 0 0  Moving slowly or fidgety/restless 0 0 0 0 0  Suicidal thoughts 0 0 1 0 0  PHQ-9 Score 6 6 12 4  0  Difficult doing work/chores Not difficult at all  Not difficult at all Somewhat difficult Not difficult at all   She currently lives with: wife Menopausal Symptoms: no  Depression Screen done today and results listed below:     08/13/2023   10:57 AM 05/16/2023   10:04 AM 02/13/2023    8:39 AM 11/14/2022    8:35 AM 10/15/2022    8:55 AM  Depression screen PHQ 2/9  Decreased Interest 2 1 1 1  0  Down, Depressed, Hopeless 2 1 2 1  0  PHQ - 2 Score 4 2 3 2  0  Altered sleeping 0 0 0 0 0  Tired, decreased energy 0 2 2 1  0  Change in appetite 0 0 2 0 0  Feeling bad or failure about yourself  2 1 2 1  0  Trouble concentrating 0 1 2 0 0  Moving slowly or fidgety/restless 0 0 0 0 0  Suicidal thoughts 0 0 1 0 0  PHQ-9 Score 6 6 12 4  0  Difficult doing work/chores Not difficult at all  Not difficult at all Somewhat difficult  Not difficult at all     Past Medical History:  Past Medical History:  Diagnosis Date   Abdominal pain    a. 05/2018 HIDA scan wnl.   ADHD    AICD (automatic cardioverter/defibrillator) present    a. 11/2014 s/p SJM Fortify Assura, single lead AICD (ser# [id number]).   Anemia    Arthritis    Asthma    Bipolar 1 disorder (HCC)    Chest pain    a. Hx of cath in [place] - reportedly nl; b. 04/2018 MV: EF 22%, fixed dist ant septal, apical, and inferoapical defects - ? scar vs. attenuation. No ischemia.   CHF (congestive heart failure) (HCC)    Cirrhosis of liver (HCC)    Coronary artery disease    Depression     Diabetes mellitus without complication (HCC)    Diverticulitis    Dysrhythmia    Heart murmur    HFrEF (heart failure with reduced ejection fraction) (HCC)    a. 06/2017 Echo: EF 20-25%, diff HK. Mildly dil LA; b. 05/2018 Echo: EF 25-30%, diff HK, Gr2 DD. Triv AI. Mod MR. Mildly reduced RV fxn. Mod-Sev TR. PASP 45-76mmHg.   Hypertension    Hypothyroidism    IBS (irritable bowel syndrome)    Insomnia    Migraines    NICM (nonischemic cardiomyopathy) (HCC)    a. EF prev 25%; b. 11/2014 s/p SJM Fortify Assura, single lead AICD (ser# [id number]); c. 06/2017 Echo: EF 20-25%; d. 05/2018 Echo: EF 25-30%, diff HK, Gr2 DD; e. 05/2018 Echo: EF 25-30%; f. 05/2018 Cath: Nl cors. LVEDP , PCWP . PA 65/40 (52). CO/CI 3.04/1.56; c. 07/2018 CPX: Mod HF limitation.   OSA (obstructive sleep apnea)    CPAP is broken   PTSD (post-traumatic stress disorder)    Restless leg syndrome    Vertigo     Surgical History:  Past Surgical History:  Procedure Laterality Date   ABDOMINAL HYSTERECTOMY     BREAST BIOPSY Left    CARDIAC CATHETERIZATION     CATARACT EXTRACTION W/PHACO Right 01/27/2019   Procedure: CATARACT EXTRACTION PHACO AND INTRAOCULAR LENS PLACEMENT (IOC)  VISION BLUE RIGHT DIABETES;  Surgeon: Jaye Fallow, MD;  Location: [facility];  Service: Ophthalmology;  Laterality: Right;  Diabetes - insulin  sleep apnea   CATARACT EXTRACTION W/PHACO Left 03/08/2020   Procedure: CATARACT EXTRACTION PHACO AND INTRAOCULAR LENS PLACEMENT (IOC) LEFT DIABETIC 10.28  00:57.8;  Surgeon: Jaye Fallow, MD;  Location: [facility];  Service: Ophthalmology;  Laterality: Left;   COLONOSCOPY WITH PROPOFOL  N/A 03/11/2019   Procedure: COLONOSCOPY WITH PROPOFOL ;  Surgeon: Janalyn Keene NOVAK, MD;  Location: [facility];  Service: Endoscopy;  Laterality: N/A;   CORONARY ARTERY BYPASS GRAFT     Pt denies   ESOPHAGOGASTRODUODENOSCOPY (EGD) WITH PROPOFOL  N/A 03/11/2019   Procedure:  ESOPHAGOGASTRODUODENOSCOPY (EGD) WITH PROPOFOL ;  Surgeon: Janalyn Keene NOVAK, MD;  Location: [facility] END[facility]COPY;  Service: Endoscopy;  Laterality: N/A;   INSERT / REPLACE / REMOVE PACEMAKER     ICD   INSERTION OF ICD  2016   St Jude Single chamber ICD   RIGHT/LEFT HEART CATH AND CORONARY ANGIOGRAPHY N/A 06/09/2018   Procedure: RIGHT/LEFT HEART CATH AND CORONARY ANGIOGRAPHY;  Surgeon: Darron Deatrice LABOR, MD;  Location: [facility] INVASIVE CV [facility];  Service: Cardiovascular;  Laterality: N/A;   TONSILLECTOMY     TUBAL LIGATION  1980    Medications:  Current Outpatient Medications on File Prior to Visit  Medication Sig   acetaminophen  (TYLENOL ) 325 MG  tablet Take 650 mg by mouth every 6 (six) hours as needed.   albuterol  (VENTOLIN  HFA) 108 (90 Base) MCG/ACT inhaler Inhale 2 puffs into the lungs every 6 (six) hours as needed for wheezing or shortness of breath.   baclofen  (LIORESAL ) 10 MG tablet TAKE 1/2 TO 1 TABLET (5-10 MG TOTAL) BY MOUTH EVERY DAY AT BEDTIME AS NEEDED FOR MUSCLE SPASM   blood glucose meter kit and supplies KIT Dispense based on patient and insurance preference. Use up to four times daily as directed. (FOR ICD-9 250.00, 250.01).   carvedilol  (COREG ) 12.5 MG tablet TAKE 1 TABLET (12.5MG  TOTAL) BY MOUTH TWICE A DAY WITH MEALS   cyclobenzaprine  (FLEXERIL ) 5 MG tablet TAKE 1 TABLET BY MOUTH THREE TIMES A DAY AS NEEDED FOR MUSCLE SPASM   digoxin  (LANOXIN ) 0.125 MG tablet TAKE 1 TABLET BY MOUTH EVERY DAY   empagliflozin  (JARDIANCE ) 25 MG TABS tablet Take 1 tablet (25 mg total) by mouth daily.   famotidine  (PEPCID ) 40 MG tablet TAKE 1 TABLET BY MOUTH EVERY DAY   furosemide  (LASIX ) 20 MG tablet TAKE 1 TABLET (20 MG TOTAL) BY MOUTH AS DIRECTED. TAKE 20MG  (1 TABLET) MONDAY, WEDNESDAY, FRIDAY.   gabapentin  (NEURONTIN ) 100 MG capsule Take 1 capsule (100 mg total) by mouth at bedtime.   insulin  glargine (LANTUS  SOLOSTAR) 100 UNIT/ML Solostar Pen Inject 45 Units into the skin daily at 10 pm.    Insulin  Pen Needle 32G X 6 MM MISC 1 each by Does not apply route daily.   lamoTRIgine  (LAMICTAL ) 25 MG tablet TAKE 1 TABLET (25 MG TOTAL) BY MOUTH DAILY.   metoCLOPramide  (REGLAN ) 10 MG tablet Take 10 mg by mouth 4 (four) times daily.   mirtazapine  (REMERON ) 7.5 MG tablet Take 1 tablet (7.5 mg total) by mouth at bedtime.   pantoprazole  (PROTONIX ) 40 MG tablet Take 1 tablet (40 mg total) by mouth daily.   potassium chloride  SA (KLOR-CON  M20) 20 MEQ tablet TAKE 2 TABLETS BY MOUTH TWICE A DAY   rosuvastatin  (CRESTOR ) 40 MG tablet Take 1 tablet (40 mg total) by mouth daily.   sacubitril -valsartan  (ENTRESTO ) 49-51 MG Take 1 tablet by mouth 2 (two) times daily.   spironolactone  (ALDACTONE ) 25 MG tablet TAKE 1 TABLET (25 MG TOTAL) BY MOUTH DAILY.   sucralfate  (CARAFATE ) 1 g tablet TAKE 1 TABLET (1 G TOTAL) BY MOUTH 4 (FOUR) TIMES DAILY.   tirzepatide  (MOUNJARO ) 15 MG/0.5ML Pen Inject 15 mg into the skin once a week.   No current facility-administered medications on file prior to visit.    Allergies:  Allergies  Allergen Reactions   Levothyroxine Rash    Social History:  Social History   Socioeconomic History   Marital status: Married    Spouse name: Not on file   Number of children: 2   Years of education: Not on file   Highest education level: High school graduate  Occupational History    Comment: disabled  Tobacco Use   Smoking status: Former    Current packs/day: 0.00    Types: Cigarettes    Quit date: 1985    Years since quitting: 40.0   Smokeless tobacco: Never   Tobacco comments:    quit over 20 years ago   Vaping Use   Vaping status: Never Used  Substance and Sexual Activity   Alcohol use: Yes   Drug use: Yes    Frequency: 1.0 times per week    Types: Marijuana    Comment: occassionally   Sexual activity: Yes  Birth control/protection: Surgical    Comment: one partner  Other Topics Concern   Not on file  Social History Narrative   Volunteers occasionally     Social Drivers of Health   Financial Resource Strain: Low Risk  (10/15/2022)   Overall Financial Resource Strain (CARDIA)    Difficulty of Paying Living Expenses: Not hard at all  Food Insecurity: No Food Insecurity (10/15/2022)   Hunger Vital Sign    Worried About Running Out of Food in the Last Year: Never true    Ran Out of Food in the Last Year: Never true  Transportation Needs: No Transportation Needs (10/15/2022)   PRAPARE - Administrator, Civil Service (Medical): No    Lack of Transportation (Non-Medical): No  Physical Activity: Insufficiently Active (10/15/2022)   Exercise Vital Sign    Days of Exercise per Week: 3 days    Minutes of Exercise per Session: 30 min  Stress: No Stress Concern Present (10/15/2022)   [employer] of Occupational Health - Occupational Stress Questionnaire    Feeling of Stress : Not at all  Social Connections: Moderately Isolated (10/15/2022)   Social Connection and Isolation Panel [NHANES]    Frequency of Communication with Friends and Family: Twice a week    Frequency of Social Gatherings with Friends and Family: More than three times a week    Attends Religious Services: Never    Database Administrator or Organizations: No    Attends Banker Meetings: Never    Marital Status: Married  Catering Manager Violence: Not At Risk (10/15/2022)   Humiliation, Afraid, Rape, and Kick questionnaire    Fear of Current or Ex-Partner: No    Emotionally Abused: No    Physically Abused: No    Sexually Abused: No   Social History   Tobacco Use  Smoking Status Former   Current packs/day: 0.00   Types: Cigarettes   Quit date: 1985   Years since quitting: 40.0  Smokeless Tobacco Never  Tobacco Comments   quit over 20 years ago    Social History   Substance and Sexual Activity  Alcohol Use Yes    Family History:  Family History  Problem Relation Age of Onset   Hypertension Mother    Hyperlipidemia Mother    Heart disease  Mother    Hypertension Sister    Asthma Sister    Heart disease Sister    Diabetes Sister    Cancer Sister    Alzheimer's disease Maternal Grandfather    Hyperlipidemia Brother    Asthma Sister    Hypertension Sister    Diabetes Sister    Breast cancer Paternal Aunt     Past medical history, surgical history, medications, allergies, family history and social history reviewed with patient today and changes made to appropriate areas of the chart.   Review of Systems  Constitutional: Negative.   HENT: Negative.    Eyes: Negative.   Respiratory: Negative.    Cardiovascular:  Positive for chest pain. Negative for palpitations, orthopnea, claudication, leg swelling and PND.  Gastrointestinal: Negative.   Genitourinary: Negative.   Musculoskeletal: Negative.   Skin: Negative.   Neurological: Negative.   Endo/Heme/Allergies:  Positive for polydipsia. Negative for environmental allergies. Does not bruise/bleed easily.  Psychiatric/Behavioral: Negative.     All other ROS negative except what is listed above and in the HPI.      Objective:    BP 118/76   Pulse 77   Ht 5'  4 (1.626 m)   Wt 184 lb 12.8 oz (83.8 kg)   SpO2 99%   BMI 31.72 kg/m   Wt Readings from Last 3 Encounters:  08/13/23 184 lb 12.8 oz (83.8 kg)  05/16/23 189 lb (85.7 kg)  05/07/23 185 lb (83.9 kg)    Physical Exam Vitals and nursing note reviewed.  Constitutional:      General: She is not in acute distress.    Appearance: Normal appearance. She is obese. She is not ill-appearing, toxic-appearing or diaphoretic.  HENT:     Head: Normocephalic and atraumatic.     Right Ear: Tympanic membrane, ear canal and external ear normal. There is no impacted cerumen.     Left Ear: Tympanic membrane, ear canal and external ear normal. There is no impacted cerumen.     Nose: Nose normal. No congestion or rhinorrhea.     Mouth/Throat:     Mouth: Mucous membranes are moist.     Pharynx: Oropharynx is clear. No  oropharyngeal exudate or posterior oropharyngeal erythema.  Eyes:     General: No scleral icterus.       Right eye: No discharge.        Left eye: No discharge.     Extraocular Movements: Extraocular movements intact.     Conjunctiva/sclera: Conjunctivae normal.     Pupils: Pupils are equal, round, and reactive to light.  Neck:     Vascular: No carotid bruit.  Cardiovascular:     Rate and Rhythm: Normal rate and regular rhythm.     Pulses: Normal pulses.     Heart sounds: No murmur heard.    No friction rub. No gallop.  Pulmonary:     Effort: Pulmonary effort is normal. No respiratory distress.     Breath sounds: Normal breath sounds. No stridor. No wheezing, rhonchi or rales.  Chest:     Chest wall: No tenderness.  Abdominal:     General: Abdomen is flat. Bowel sounds are normal. There is no distension.     Palpations: Abdomen is soft. There is no mass.     Tenderness: There is no abdominal tenderness. There is no right CVA tenderness, left CVA tenderness, guarding or rebound.     Hernia: No hernia is present.  Genitourinary:    Comments: Breast and pelvic exams deferred with shared decision making Musculoskeletal:        General: No swelling, tenderness, deformity or signs of injury.     Cervical back: Normal range of motion and neck supple. No rigidity. No muscular tenderness.     Right lower leg: No edema.     Left lower leg: No edema.  Lymphadenopathy:     Cervical: No cervical adenopathy.  Skin:    General: Skin is warm and dry.     Capillary Refill: Capillary refill takes less than 2 seconds.     Coloration: Skin is not jaundiced or pale.     Findings: No bruising, erythema, lesion or rash.  Neurological:     General: No focal deficit present.     Mental Status: She is alert and oriented to person, place, and time. Mental status is at baseline.     Cranial Nerves: No cranial nerve deficit.     Sensory: No sensory deficit.     Motor: No weakness.     Coordination:  Coordination normal.     Gait: Gait normal.     Deep Tendon Reflexes: Reflexes normal.  Psychiatric:  Mood and Affect: Mood normal.        Behavior: Behavior normal.        Thought Content: Thought content normal.        Judgment: Judgment normal.     Results for orders placed or performed in visit on 08/13/23  Microalbumin, Urine Waived   Collection Time: 08/13/23 10:57 AM  Result Value Ref Range   Microalb, Ur Waived 150 (H) 0 - 19 mg/L   Creatinine, Urine Waived 100 10 - 300 mg/dL   Microalb/Creat Ratio >300 (H) <30 mg/g  Bayer DCA Hb A1c Waived   Collection Time: 08/13/23 10:57 AM  Result Value Ref Range   HB A1C (BAYER DCA - WAIVED) 8.9 (H) 4.8 - 5.6 %  CBC with Differential/Platelet   Collection Time: 08/13/23 10:58 AM  Result Value Ref Range   WBC 5.5 3.4 - 10.8 x10E3/uL   RBC 4.34 3.77 - 5.28 x10E6/uL   Hemoglobin 13.3 11.1 - 15.9 g/dL   Hematocrit 59.3 65.9 - 46.6 %   MCV 94 79 - 97 fL   MCH 30.6 26.6 - 33.0 pg   MCHC 32.8 31.5 - 35.7 g/dL   RDW 87.7 88.2 - 84.5 %   Platelets 205 150 - 450 x10E3/uL   Neutrophils 44 Not Estab. %   Lymphs 43 Not Estab. %   Monocytes 9 Not Estab. %   Eos 3 Not Estab. %   Basos 1 Not Estab. %   Neutrophils Absolute 2.4 1.4 - 7.0 x10E3/uL   Lymphocytes Absolute 2.4 0.7 - 3.1 x10E3/uL   Monocytes Absolute 0.5 0.1 - 0.9 x10E3/uL   EOS (ABSOLUTE) 0.1 0.0 - 0.4 x10E3/uL   Basophils Absolute 0.1 0.0 - 0.2 x10E3/uL   Immature Granulocytes 0 Not Estab. %   Immature Grans (Abs) 0.0 0.0 - 0.1 x10E3/uL  Comprehensive metabolic panel   Collection Time: 08/13/23 10:58 AM  Result Value Ref Range   Glucose 282 (H) 70 - 99 mg/dL   BUN 29 (H) 6 - 24 mg/dL   Creatinine, Ser 8.61 (H) 0.57 - 1.00 mg/dL   eGFR 44 (L) >40 fO/fpw/8.26   BUN/Creatinine Ratio 21 9 - 23   Sodium 136 134 - 144 mmol/L   Potassium 4.5 3.5 - 5.2 mmol/L   Chloride 100 96 - 106 mmol/L   CO2 21 20 - 29 mmol/L   Calcium  9.4 8.7 - 10.2 mg/dL   Total Protein 7.7 6.0  - 8.5 g/dL   Albumin 4.5 3.8 - 4.9 g/dL   Globulin, Total 3.2 1.5 - 4.5 g/dL   Bilirubin Total 0.7 0.0 - 1.2 mg/dL   Alkaline Phosphatase 96 44 - 121 IU/L   AST 17 0 - 40 IU/L   ALT 19 0 - 32 IU/L  Lipid Panel w/o Chol/HDL Ratio   Collection Time: 08/13/23 10:58 AM  Result Value Ref Range   Cholesterol, Total 162 100 - 199 mg/dL   Triglycerides 665 (H) 0 - 149 mg/dL   HDL 32 (L) >60 mg/dL   VLDL Cholesterol Cal 54 (H) 5 - 40 mg/dL   LDL Chol Calc (NIH) 76 0 - 99 mg/dL  TSH   Collection Time: 08/13/23 10:58 AM  Result Value Ref Range   TSH 2.240 0.450 - 4.500 uIU/mL      Assessment & Plan:   Problem List Items Addressed This Visit       Cardiovascular and Mediastinum   Chronic systolic heart failure (HCC) (Chronic)   Euvolemic today. Continue to  monitor. Call with any concerns.         Digestive   Cirrhosis (HCC)   Stable. Continue to follow with GI. Rechecking labs today. Await results.         Endocrine   DM (diabetes mellitus), type 2 with renal complications (HCC)   A1c up to 8.9- will really work on her diet and exercise and recheck in 3 months. Call with any concerns.       Relevant Orders   Microalbumin, Urine Waived (Completed)   Bayer DCA Hb A1c Waived (Completed)   CBC with Differential/Platelet (Completed)   Comprehensive metabolic panel (Completed)   Lipid Panel w/o Chol/HDL Ratio (Completed)   Hypothyroid   Rechecking labs checked today. Await results. Treat as needed.       Relevant Orders   TSH (Completed)     Genitourinary   Benign hypertensive renal disease   Under good control on current regimen. Continue current regimen. Continue to monitor. Call with any concerns. Refills given. Labs drawn today.         Other   Bipolar disorder, current episode depressed, moderate (HCC)   Under good control on current regimen. Continue current regimen. Continue to monitor. Call with any concerns. Refills given. Labs drawn today.      Other Visit  Diagnoses       Routine general medical examination at a health care facility    -  Primary   Vaccines up to date. Screening labs checked today. Pap N/A. Mammo and colonoscopy up to date. Continue diet and exercise. Call with any concerns.        Follow up plan: Return in about 3 months (around 11/11/2023).   LABORATORY TESTING:  - Pap smear: not applicable  IMMUNIZATIONS:   - Tdap: Tetanus vaccination status reviewed: last tetanus booster within 10 years. - Influenza: Up to date - Pneumovax: Up to date - Prevnar: Given elsewhere - COVID: Up to date - HPV: Not applicable - Shingrix vaccine: Given elsewhere  SCREENING: -Mammogram: Up to date  - Colonoscopy: Up to date   PATIENT COUNSELING:   Advised to take 1 mg of folate supplement per day if capable of pregnancy.   Sexuality: Discussed sexually transmitted diseases, partner selection, use of condoms, avoidance of unintended pregnancy  and contraceptive alternatives.   Advised to avoid cigarette smoking.  I discussed with the patient that most people either abstain from alcohol or drink within safe limits (<=14/week and <=4 drinks/occasion for males, <=7/weeks and <= 3 drinks/occasion for females) and that the risk for alcohol disorders and other health effects rises proportionally with the number of drinks per week and how often a drinker exceeds daily limits.  Discussed cessation/primary prevention of drug use and availability of treatment for abuse.   Diet: Encouraged to adjust caloric intake to maintain  or achieve ideal body weight, to reduce intake of dietary saturated fat and total fat, to limit sodium intake by avoiding high sodium foods and not adding table salt, and to maintain adequate dietary potassium and calcium  preferably from fresh fruits, vegetables, and low-fat dairy products.    stressed the importance of regular exercise  Injury prevention: Discussed safety belts, safety helmets, smoke detector, smoking  near bedding or upholstery.   Dental health: Discussed importance of regular tooth brushing, flossing, and dental visits.    NEXT PREVENTATIVE PHYSICAL DUE IN 1 YEAR. Return in about 3 months (around 11/11/2023).

## 2023-08-14 LAB — COMPREHENSIVE METABOLIC PANEL
ALT: 19 [IU]/L (ref 0–32)
AST: 17 [IU]/L (ref 0–40)
Albumin: 4.5 g/dL (ref 3.8–4.9)
Alkaline Phosphatase: 96 [IU]/L (ref 44–121)
BUN/Creatinine Ratio: 21 (ref 9–23)
BUN: 29 mg/dL — ABNORMAL HIGH (ref 6–24)
Bilirubin Total: 0.7 mg/dL (ref 0.0–1.2)
CO2: 21 mmol/L (ref 20–29)
Calcium: 9.4 mg/dL (ref 8.7–10.2)
Chloride: 100 mmol/L (ref 96–106)
Creatinine, Ser: 1.38 mg/dL — ABNORMAL HIGH (ref 0.57–1.00)
Globulin, Total: 3.2 g/dL (ref 1.5–4.5)
Glucose: 282 mg/dL — ABNORMAL HIGH (ref 70–99)
Potassium: 4.5 mmol/L (ref 3.5–5.2)
Sodium: 136 mmol/L (ref 134–144)
Total Protein: 7.7 g/dL (ref 6.0–8.5)
eGFR: 44 mL/min/{1.73_m2} — ABNORMAL LOW (ref >59)

## 2023-08-14 LAB — LIPID PANEL W/O CHOL/HDL RATIO
Cholesterol, Total: 162 mg/dL (ref 100–199)
HDL: 32 mg/dL — ABNORMAL LOW (ref >39)
LDL Chol Calc (NIH): 76 mg/dL (ref 0–99)
Triglycerides: 334 mg/dL — ABNORMAL HIGH (ref 0–149)
VLDL Cholesterol Cal: 54 mg/dL — ABNORMAL HIGH (ref 5–40)

## 2023-08-14 LAB — TSH: TSH: 2.24 u[IU]/mL (ref 0.450–4.500)

## 2023-08-14 LAB — CBC WITH DIFFERENTIAL/PLATELET
Basophils Absolute: 0.1 10*3/uL (ref 0.0–0.2)
Basos: 1 %
EOS (ABSOLUTE): 0.1 10*3/uL (ref 0.0–0.4)
Eos: 3 %
Hematocrit: 40.6 % (ref 34.0–46.6)
Hemoglobin: 13.3 g/dL (ref 11.1–15.9)
Immature Grans (Abs): 0 10*3/uL (ref 0.0–0.1)
Immature Granulocytes: 0 %
Lymphocytes Absolute: 2.4 10*3/uL (ref 0.7–3.1)
Lymphs: 43 %
MCH: 30.6 pg (ref 26.6–33.0)
MCHC: 32.8 g/dL (ref 31.5–35.7)
MCV: 94 fL (ref 79–97)
Monocytes Absolute: 0.5 10*3/uL (ref 0.1–0.9)
Monocytes: 9 %
Neutrophils Absolute: 2.4 10*3/uL (ref 1.4–7.0)
Neutrophils: 44 %
Platelets: 205 10*3/uL (ref 150–450)
RBC: 4.34 x10E6/uL (ref 3.77–5.28)
RDW: 12.2 % (ref 11.7–15.4)
WBC: 5.5 10*3/uL (ref 3.4–10.8)

## 2023-08-18 ENCOUNTER — Encounter: Payer: Self-pay | Admitting: Family Medicine

## 2023-08-18 NOTE — Assessment & Plan Note (Signed)
Rechecking labs checked today. Await results. Treat as needed.

## 2023-08-18 NOTE — Assessment & Plan Note (Signed)
 Under good control on current regimen. Continue current regimen. Continue to monitor. Call with any concerns. Refills given. Labs drawn today.

## 2023-08-18 NOTE — Assessment & Plan Note (Signed)
Euvolemic today. Continue to monitor. Call with any concerns.  

## 2023-08-18 NOTE — Assessment & Plan Note (Signed)
 A1c up to 8.9- will really work on her diet and exercise and recheck in 3 months. Call with any concerns.

## 2023-08-18 NOTE — Assessment & Plan Note (Signed)
 Stable. Continue to follow with GI. Rechecking labs today. Await results.

## 2023-08-19 ENCOUNTER — Encounter: Payer: Self-pay | Admitting: Family Medicine

## 2023-09-02 MED ORDER — DICLOFENAC SODIUM 1 % EX GEL: 4.0000 g | Freq: Four times a day (QID) | CUTANEOUS | 3 refills | Status: DC

## 2023-09-24 ENCOUNTER — Encounter: Payer: Self-pay | Admitting: Family Medicine

## 2023-09-24 ENCOUNTER — Other Ambulatory Visit: Payer: Self-pay | Admitting: Cardiovascular Disease

## 2023-09-25 NOTE — Telephone Encounter (Signed)
Please contact pt for future appointment. Pt due for follow up.

## 2023-09-26 ENCOUNTER — Telehealth: Payer: Self-pay | Admitting: Cardiovascular Disease

## 2023-09-26 NOTE — Telephone Encounter (Signed)
Left voice mail

## 2023-09-26 NOTE — Telephone Encounter (Signed)
Left voicemail, pt needs appointment scheduled from recall

## 2023-09-30 ENCOUNTER — Telehealth: Payer: Self-pay

## 2023-09-30 NOTE — Telephone Encounter (Signed)
 Left voice mail

## 2023-09-30 NOTE — Telephone Encounter (Signed)
Left voicemail, needs appt scheduled from voicemail

## 2023-09-30 NOTE — Telephone Encounter (Signed)
Ok for [facility] to review.  Patient was identified as falling into the [place] Measure - Diabetes.   Patient was: Left voicemail to schedule with primary care provider.   Other options could also be referral to Pharmacy for chronic disease management.

## 2023-10-02 NOTE — Telephone Encounter (Signed)
Pt scheduled on 4/22 

## 2023-10-07 NOTE — Telephone Encounter (Signed)
 Left message for patient. Please advise appt is not needed for 1 more month.

## 2023-10-08 ENCOUNTER — Ambulatory Visit: Payer: Medicare HMO | Admitting: Family Medicine

## 2023-10-10 DIAGNOSIS — E119 Type 2 diabetes mellitus without complications: Secondary | ICD-10-CM | POA: Diagnosis not present

## 2023-10-10 DIAGNOSIS — H43813 Vitreous degeneration, bilateral: Secondary | ICD-10-CM | POA: Diagnosis not present

## 2023-10-10 DIAGNOSIS — Z961 Presence of intraocular lens: Secondary | ICD-10-CM | POA: Diagnosis not present

## 2023-10-10 LAB — HM DIABETES EYE EXAM

## 2023-10-10 NOTE — Progress Notes (Signed)
   10/10/2023  Patient ID: Bonnie Ochoa, female   DOB: 1965/12/01, 58 y.o.   MRN: [id number]  Pharmacy Quality Measure Review  This patient is appearing on a report for being at risk of failing TNM:  DM  Last documented A1c or GMI 8.9% on 08/13/2023  MyChart message send to schedule telephone visit for medication review and check in on control of DM.  Lenna Gilford, PharmD, DPLA

## 2023-10-21 ENCOUNTER — Other Ambulatory Visit: Payer: Medicare HMO

## 2023-10-21 NOTE — Progress Notes (Unsigned)
   10/21/2023  Patient ID: Bonnie Ochoa, female   DOB: 08-12-1966, 58 y.o.   MRN: [id number]  Outreach attempt for scheduled telephone visit was unsuccessful.  Called and left HIPAA compliant voicemail x2.  I left my direct phone number for patient to return my call at her convenience, and I am also sending a MyChart message to attempt to reschedule.  Lenna Gilford, PharmD, DPLA

## 2023-10-22 ENCOUNTER — Other Ambulatory Visit: Payer: Self-pay

## 2023-10-22 NOTE — Progress Notes (Unsigned)
   10/22/2023  Patient ID: Bonnie Ochoa, female   DOB: 17-Apr-1966, 58 y.o.   MRN: [id number]  Outreach attempt for (re)scheduled telephone visit unsuccessful.  HIPAA compliant voicemail was left with my direct phone number.  I will try to contact patient again next week if I do not hear back.  Lenna Gilford, PharmD, DPLA

## 2023-10-27 ENCOUNTER — Other Ambulatory Visit: Payer: Self-pay | Admitting: Cardiovascular Disease

## 2023-10-27 ENCOUNTER — Other Ambulatory Visit: Payer: Self-pay | Admitting: Family Medicine

## 2023-10-29 NOTE — Telephone Encounter (Signed)
 Requested Prescriptions  Pending Prescriptions Disp Refills   famotidine (PEPCID) 40 MG tablet [Pharmacy Med Name: FAMOTIDINE 40 MG TABLET] 90 tablet 3    Sig: TAKE 1 TABLET BY MOUTH EVERY DAY     Gastroenterology:  H2 Antagonists Passed - 10/29/2023  8:31 AM      Passed - Valid encounter within last 12 months    Recent Outpatient Visits           2 months ago Routine general medical examination at a health care facility   [facility] [place] [facility], DO   5 months ago Type 2 diabetes mellitus with stage 3a chronic kidney disease, with long-term current use of insulin (HCC)   [facility] [place], [place] P, DO   5 months ago Cervicogenic headache   [facility] [place], Megan P, DO   8 months ago Type 2 diabetes mellitus with stage 3a chronic kidney disease, with long-term current use of insulin (HCC)   [facility] [place], Megan P, DO   11 months ago Type 2 diabetes mellitus with stage 3a chronic kidney disease, with long-term current use of insulin (HCC)   [facility] [place], [place], DO       Future Appointments             In 1 week Laural Benes, Oralia Rud, DO [facility]   In 1 month Gollan, Tollie Pizza, MD [facility]

## 2023-10-31 NOTE — Progress Notes (Signed)
   10/31/2023  Patient ID: Bonnie Ochoa, female   DOB: June 04, 1966, 58 y.o.   MRN: [id number]  Pharmacy Quality Measure Review  This patient is appearing on a report for TNM: DM with A1c >8%  Last documented A1c or GMI 8.9% on 08/12/2024  -No call/no show for 2 scheduled telephone visits to review medications and address control of diabetes -Patient sees PCP again 11/05/23 and will be due for A1c -Pharmacy fill report reflects good adherence to Jardiance 25mg  daily and Lantus 45 units daily, but patient does not appear to be taking Mounjaro -I recommend addressing barrier(s) to [facility] adherence and PharmD engagement to assist with this and medication management of T2DM  Lenna Gilford, PharmD, DPLA

## 2023-11-05 ENCOUNTER — Ambulatory Visit: Payer: Medicare HMO | Admitting: Family Medicine

## 2023-11-07 ENCOUNTER — Other Ambulatory Visit: Payer: Self-pay | Admitting: Family Medicine

## 2023-11-08 ENCOUNTER — Ambulatory Visit: Payer: Self-pay | Admitting: Family Medicine

## 2023-11-08 NOTE — Telephone Encounter (Signed)
 OV 11/05/23 Requested Prescriptions  Pending Prescriptions Disp Refills   albuterol (VENTOLIN HFA) 108 (90 Base) MCG/ACT inhaler [Pharmacy Med Name: ALBUTEROL HFA (PROAIR) INHALER] 8.5 each 2    Sig: TAKE 2 PUFFS BY MOUTH EVERY 6 HOURS AS NEEDED FOR WHEEZE OR SHORTNESS OF BREATH     Pulmonology:  Beta Agonists 2 Failed - 11/08/2023 11:39 AM      Failed - Valid encounter within last 12 months    Recent Outpatient Visits   None     Future Appointments             In 3 weeks Gollan, Tollie Pizza, MD [facility] - Last BP in normal range    BP Readings from Last 1 Encounters:  08/13/23 118/76         Passed - Last Heart Rate in normal range    Pulse Readings from Last 1 Encounters:  08/13/23 77

## 2023-11-12 ENCOUNTER — Other Ambulatory Visit: Payer: Self-pay | Admitting: Cardiovascular Disease

## 2023-11-12 ENCOUNTER — Other Ambulatory Visit: Payer: Self-pay

## 2023-11-12 NOTE — Progress Notes (Unsigned)
   11/12/2023  Patient ID: [name], female   DOB: 03/12/1966, 58 y.o.   MRN: [id number]  Unsuccessful outreach attempt for scheduled telephone visit, but I was several minutes late in calling after being tied up on previous call. HIPAA compliant voicemail left with my direct number and sending MyChart message to attempt to reschedule. Lenna Gilford, PharmD, DPLA

## 2023-11-14 ENCOUNTER — Encounter: Payer: Self-pay | Admitting: Internal Medicine

## 2023-11-14 ENCOUNTER — Ambulatory Visit: Payer: Medicare HMO | Attending: Internal Medicine | Admitting: Internal Medicine

## 2023-11-14 VITALS — BP 120/70 | HR 80 | Ht 65.0 in | Wt 187.0 lb

## 2023-11-14 DIAGNOSIS — R079 Chest pain, unspecified: Secondary | ICD-10-CM

## 2023-11-14 DIAGNOSIS — I272 Pulmonary hypertension, unspecified: Secondary | ICD-10-CM | POA: Diagnosis not present

## 2023-11-14 DIAGNOSIS — I5022 Chronic systolic (congestive) heart failure: Secondary | ICD-10-CM | POA: Diagnosis not present

## 2023-11-14 DIAGNOSIS — I10 Essential (primary) hypertension: Secondary | ICD-10-CM

## 2023-11-14 DIAGNOSIS — Z95810 Presence of automatic (implantable) cardiac defibrillator: Secondary | ICD-10-CM | POA: Diagnosis not present

## 2023-11-14 NOTE — Patient Instructions (Addendum)
 Medication Instructions:  The current medical regimen is effective;  continue present plan and medications as directed. Please refer to the Current Medication list given to you today.   *If you need a refill on your cardiac medications before your next appointment, please call your pharmacy*  Lab Work: Your provider would like for you to have following labs drawn today DIG, BMET.   If you have labs (blood work) drawn today and your tests are completely normal, you will receive your results only by: MyChart Message (if you have MyChart) OR A paper copy in the mail If you have any lab test that is abnormal or we need to change your treatment, we will call you to review the results.   Follow-Up: At [facility], you and your health needs are our priority.  As part of our continuing mission to provide you with exceptional heart care, our providers are all part of one team.  This team includes your primary Cardiologist (physician) and Advanced Practice Providers or APPs (Physician Assistants and Nurse Practitioners) who all work together to provide you with the care you need, when you need it.  Your next appointment:   12 month with Dr.Parker  Keep scheduled appointment with Dr.Gollan  We recommend signing up for the patient portal called "MyChart".  Sign up information is provided on this After Visit Summary.  MyChart is used to connect with patients for Virtual Visits (Telemedicine).  Patients are able to view lab/test results, encounter notes, upcoming appointments, etc.  Non-urgent messages can be sent to your provider as well.   To learn more about what you can do with MyChart, go to [email or web address].

## 2023-11-14 NOTE — Progress Notes (Signed)
 Patient Care Team: Dorcas Carrow, DO as PCP - General (Family Medicine) Antonieta Iba, MD as PCP - Cardiology (Cardiology) Duke Salvia, MD as PCP - Electrophysiology (Cardiology) Bensimhon, Bevelyn Buckles, MD as PCP - Advanced Heart Failure (Cardiology) Midge Minium, MD as Consulting Physician (Gastroenterology) Mariah Milling Tollie Pizza, MD as Consulting Physician (Cardiology) Delma Freeze, FNP as Nurse Practitioner (Family Medicine) Bridgett Larsson, LCSW as Social Worker (Licensed Clinical Social Worker)   HPI  Bonnie Ochoa is a 58 y.o. female Seen with CC of St Jude  ICD implanted for primary prevention in the setting of NICM    She has chronic heart failure followed by TG and previously by heart failure  The patient denies chest pain, shortness of breath, nocturnal dyspnea, orthopnea or peripheral edema.  There have been no palpitations.  Complains of syncopal event that occurred in the shower last week.  She had a couple shots that day which are not usual for her.  Residual orthostatic intolerance, some postevent nausea and fatigue.  Has a history of lightheadedness in the shower as well as lightheadedness when exposed to ambient environmental heat..     Records and Results Reviewed DATE TEST EF   10/17 Echo 20-25 %   11/18 Echo  20.25 %   3/23 Echo  30-35%    Date Cr K Hgb DIG  11/18 4.74 3.1 11.2   12/18 1.24 3.8 9.7   4/23    1.3   1/24 1.32 3.7 11.9<<13.5   12/24 1.38 4.5 13.3      Past Medical History:  Diagnosis Date   Abdominal pain    a. 05/2018 HIDA scan wnl.   ADHD    AICD (automatic cardioverter/defibrillator) present    a. 11/2014 s/p SJM Fortify Assura, single lead AICD (ser# [id number]).   Anemia    Arthritis    Asthma    Bipolar 1 disorder (HCC)    Chest pain    a. Hx of cath in [place] - reportedly nl; b. 04/2018 MV: EF 22%, fixed dist ant septal, apical, and inferoapical defects - ? scar vs. attenuation. No ischemia.   CHF (congestive heart  failure) (HCC)    Cirrhosis of liver (HCC)    Coronary artery disease    Depression    Diabetes mellitus without complication (HCC)    Diverticulitis    Dysrhythmia    Heart murmur    HFrEF (heart failure with reduced ejection fraction) (HCC)    a. 06/2017 Echo: EF 20-25%, diff HK. Mildly dil LA; b. 05/2018 Echo: EF 25-30%, diff HK, Gr2 DD. Triv AI. Mod MR. Mildly reduced RV fxn. Mod-Sev TR. PASP 45-84mmHg.   Hypertension    Hypothyroidism    IBS (irritable bowel syndrome)    Insomnia    Migraines    NICM (nonischemic cardiomyopathy) (HCC)    a. EF prev 25%; b. 11/2014 s/p SJM Fortify Assura, single lead AICD (ser# [id number]); c. 06/2017 Echo: EF 20-25%; d. 05/2018 Echo: EF 25-30%, diff HK, Gr2 DD; e. 05/2018 Echo: EF 25-30%; f. 05/2018 Cath: Nl cors. LVEDP , PCWP . PA 65/40 (52). CO/CI 3.04/1.56; c. 07/2018 CPX: Mod HF limitation.   OSA (obstructive sleep apnea)    CPAP is broken   PTSD (post-traumatic stress disorder)    Restless leg syndrome    Vertigo     Past Surgical History:  Procedure Laterality Date   ABDOMINAL HYSTERECTOMY     BREAST BIOPSY Left  CARDIAC CATHETERIZATION     CATARACT EXTRACTION W/PHACO Right 01/27/2019   Procedure: CATARACT EXTRACTION PHACO AND INTRAOCULAR LENS PLACEMENT (IOC)  VISION BLUE RIGHT DIABETES;  Surgeon: Galen Manila, MD;  Location: [facility];  Service: Ophthalmology;  Laterality: Right;  Diabetes - insulin sleep apnea   CATARACT EXTRACTION W/PHACO Left 03/08/2020   Procedure: CATARACT EXTRACTION PHACO AND INTRAOCULAR LENS PLACEMENT (IOC) LEFT DIABETIC 10.28  00:57.8;  Surgeon: Galen Manila, MD;  Location: [facility];  Service: Ophthalmology;  Laterality: Left;   COLONOSCOPY WITH PROPOFOL N/A 03/11/2019   Procedure: COLONOSCOPY WITH PROPOFOL;  Surgeon: Pasty Spillers, MD;  Location: [facility] ENDOSCOPY;  Service: Endoscopy;  Laterality: N/A;   CORONARY ARTERY BYPASS GRAFT     Pt denies    ESOPHAGOGASTRODUODENOSCOPY (EGD) WITH PROPOFOL N/A 03/11/2019   Procedure: ESOPHAGOGASTRODUODENOSCOPY (EGD) WITH PROPOFOL;  Surgeon: Pasty Spillers, MD;  Location: [facility] ENDOSCOPY;  Service: Endoscopy;  Laterality: N/A;   INSERT / REPLACE / REMOVE PACEMAKER     ICD   INSERTION OF ICD  2016   St Jude Single chamber ICD   RIGHT/LEFT HEART CATH AND CORONARY ANGIOGRAPHY N/A 06/09/2018   Procedure: RIGHT/LEFT HEART CATH AND CORONARY ANGIOGRAPHY;  Surgeon: Iran Ouch, MD;  Location: [facility] INVASIVE CV LAB;  Service: Cardiovascular;  Laterality: N/A;   TONSILLECTOMY     TUBAL LIGATION  1980    Current Outpatient Medications  Medication Sig Dispense Refill   acetaminophen (TYLENOL) 325 MG tablet Take 650 mg by mouth every 6 (six) hours as needed.     albuterol (VENTOLIN HFA) 108 (90 Base) MCG/ACT inhaler TAKE 2 PUFFS BY MOUTH EVERY 6 HOURS AS NEEDED FOR WHEEZE OR SHORTNESS OF BREATH 8.5 each 2   baclofen (LIORESAL) 10 MG tablet TAKE 1/2 TO 1 TABLET (5-10 MG TOTAL) BY MOUTH EVERY DAY AT BEDTIME AS NEEDED FOR MUSCLE SPASM 30 tablet 0   blood glucose meter kit and supplies KIT Dispense based on patient and insurance preference. Use up to four times daily as directed. (FOR ICD-9 250.00, 250.01). 1 each 0   carvedilol (COREG) 12.5 MG tablet TAKE 1 TABLET (12.5MG  TOTAL) BY MOUTH TWICE A DAY WITH MEALS 180 tablet 2   cyclobenzaprine (FLEXERIL) 5 MG tablet TAKE 1 TABLET BY MOUTH THREE TIMES A DAY AS NEEDED FOR MUSCLE SPASM 60 tablet 1   diclofenac Sodium (VOLTAREN) 1 % GEL Apply 4 g topically 4 (four) times daily. 350 g 3   digoxin (LANOXIN) 0.125 MG tablet TAKE 1 TABLET BY MOUTH EVERY DAY 90 tablet 0   empagliflozin (JARDIANCE) 25 MG TABS tablet Take 1 tablet (25 mg total) by mouth daily. 90 tablet 1   famotidine (PEPCID) 40 MG tablet TAKE 1 TABLET BY MOUTH EVERY DAY 90 tablet 3   furosemide (LASIX) 20 MG tablet TAKE 1 TABLET (20 MG TOTAL) BY MOUTH AS DIRECTED. TAKE 20MG  (1 TABLET) MONDAY,  WEDNESDAY, FRIDAY. 36 tablet 0   gabapentin (NEURONTIN) 100 MG capsule Take 1 capsule (100 mg total) by mouth at bedtime. 90 capsule 1   insulin glargine (LANTUS SOLOSTAR) 100 UNIT/ML Solostar Pen Inject 45 Units into the skin daily at 10 pm. 15 mL 6   Insulin Pen Needle 32G X 6 MM MISC 1 each by Does not apply route daily. 100 each 12   lamoTRIgine (LAMICTAL) 25 MG tablet TAKE 1 TABLET (25 MG TOTAL) BY MOUTH DAILY. 90 tablet 2   metoCLOPramide (REGLAN) 10 MG tablet Take 10 mg by mouth 4 (four)  times daily.     mirtazapine (REMERON) 7.5 MG tablet Take 1 tablet (7.5 mg total) by mouth at bedtime. 90 tablet 1   pantoprazole (PROTONIX) 40 MG tablet Take 1 tablet (40 mg total) by mouth daily. 90 tablet 1   potassium chloride SA (KLOR-CON M20) 20 MEQ tablet TAKE 2 TABLETS BY MOUTH TWICE A DAY 360 tablet 3   rosuvastatin (CRESTOR) 40 MG tablet TAKE 1 TABLET BY MOUTH EVERY DAY 90 tablet 2   sacubitril-valsartan (ENTRESTO) 49-51 MG TAKE 1 TABLET BY MOUTH TWICE A DAY 60 tablet 1   spironolactone (ALDACTONE) 25 MG tablet TAKE 1 TABLET (25 MG TOTAL) BY MOUTH DAILY. 90 tablet 2   sucralfate (CARAFATE) 1 g tablet TAKE 1 TABLET (1 G TOTAL) BY MOUTH 4 (FOUR) TIMES DAILY. 360 tablet 3   tirzepatide (MOUNJARO) 15 MG/0.5ML Pen Inject 15 mg into the skin once a week. (Patient not taking: Reported on 11/14/2023) 6 mL 3   No current facility-administered medications for this visit.    Allergies  Allergen Reactions   Levothyroxine Rash      Review of Systems negative except from HPI and PMH  Physical Exam Ht 5\' 5"  (1.651 m)   Wt 187 lb (84.8 kg)   BMI 31.12 kg/m  Well developed and well nourished in no acute distress HENT normal Neck supple with JVP-flat Clear Device pocket well healed; without hematoma or erythema.  There is no tethering  Regular rate and rhythm, no  gallop No  murmur Abd-soft with active BS No Clubbing cyanosis  edema Skin-warm and dry A & Oriented  Grossly normal sensory and  motor function  ECG sinus @ 80 18/11/36  Device function is normal. Programming changes monitoring zone was made active at a rate of 160 with 24 intervals and VF detection was increased from 12 intervals--16 intervals at 200 bpm See Paceart for details      Assessment and  Plan NICM  CHF chronic systolic  ICD St Jude  Syncope  Euvolemic.  Continue GDMT with Jardiance carvedilol Entresto and spironolactone.  She is also on digoxin.  Will check a digoxin level today.  For syncopal event sounds like most likely neurally mediated.  Recovery symptoms, associated epiphenomenon in the context of his showering seen alcohol use certainly makes that scenario likely particular given her history of prior lightheadedness in the shower.  It could have been arrhythmic, as noted above, the device was programmed to create a monitor zone to look for slower ventricular arrhythmias.  Advised against driving as we do not know what the cause was  Emphasize importance of follow-up with general cardiology given the complexity of her pharmacological regime     Current medicines are reviewed at length with the patient today .  The patient does not  have concerns regarding medicines.

## 2023-11-15 LAB — BASIC METABOLIC PANEL WITH GFR
BUN/Creatinine Ratio: 19 (ref 9–23)
BUN: 24 mg/dL (ref 6–24)
CO2: 19 mmol/L — ABNORMAL LOW (ref 20–29)
Calcium: 9.3 mg/dL (ref 8.7–10.2)
Chloride: 103 mmol/L (ref 96–106)
Creatinine, Ser: 1.26 mg/dL — ABNORMAL HIGH (ref 0.57–1.00)
Glucose: 356 mg/dL — ABNORMAL HIGH (ref 70–99)
Potassium: 4.5 mmol/L (ref 3.5–5.2)
Sodium: 138 mmol/L (ref 134–144)
eGFR: 49 mL/min/1.73 — ABNORMAL LOW

## 2023-11-15 LAB — DIGOXIN LEVEL: Digoxin, Serum: 1 ng/mL — ABNORMAL HIGH (ref 0.5–0.9)

## 2023-11-18 ENCOUNTER — Other Ambulatory Visit: Payer: Self-pay

## 2023-11-18 NOTE — Progress Notes (Signed)
   11/18/2023  Patient ID: Bonnie Ochoa, female   DOB: 1966/02/25, 58 y.o.   MRN: [id number]  Fourth unsuccessful outreach for scheduled telephone visit to review medications and check in on blood glucose control.  I was able to leave HIPAA compliant voicemail with my direct phone number.  Patient sees PCP tomorrow for DM follow-up.  Sending message to Dr. Laural Benes with the following information:  -Pharmacy fill report reflects good adherence to Jardiance 25mg  daily and Lantus 45 units daily, but patient does not appear to be taking Mounjaro -I recommend addressing barrier(s) to [facility] adherence and PharmD engagement to assist with this and medication management of T2DM  Lenna Gilford, PharmD, DPLA

## 2023-11-19 ENCOUNTER — Ambulatory Visit (INDEPENDENT_AMBULATORY_CARE_PROVIDER_SITE_OTHER): Admitting: Family Medicine

## 2023-11-19 ENCOUNTER — Encounter: Payer: Self-pay | Admitting: Family Medicine

## 2023-11-19 ENCOUNTER — Other Ambulatory Visit: Payer: Self-pay | Admitting: Family Medicine

## 2023-11-19 ENCOUNTER — Telehealth: Payer: Self-pay

## 2023-11-19 VITALS — BP 110/86 | HR 78 | Temp 97.7°F | Ht 65.0 in | Wt 184.4 lb

## 2023-11-19 DIAGNOSIS — R55 Syncope and collapse: Secondary | ICD-10-CM

## 2023-11-19 DIAGNOSIS — E1122 Type 2 diabetes mellitus with diabetic chronic kidney disease: Secondary | ICD-10-CM | POA: Diagnosis not present

## 2023-11-19 DIAGNOSIS — N1831 Chronic kidney disease, stage 3a: Secondary | ICD-10-CM | POA: Diagnosis not present

## 2023-11-19 DIAGNOSIS — Z794 Long term (current) use of insulin: Secondary | ICD-10-CM | POA: Diagnosis not present

## 2023-11-19 LAB — BAYER DCA HB A1C WAIVED: HB A1C (BAYER DCA - WAIVED): 9.4 % — ABNORMAL HIGH (ref 4.8–5.6)

## 2023-11-19 MED ORDER — FREESTYLE LIBRE 3 READER DEVI: 0 refills | Status: DC

## 2023-11-19 MED ORDER — TIRZEPATIDE 15 MG/0.5ML ~~LOC~~ SOAJ: 15.0000 mg | SUBCUTANEOUS | 1 refills | Status: DC

## 2023-11-19 MED ORDER — TIRZEPATIDE 12.5 MG/0.5ML ~~LOC~~ SOAJ: 12.5000 mg | SUBCUTANEOUS | 0 refills | Status: DC

## 2023-11-19 MED ORDER — FREESTYLE LIBRE 3 SENSOR MISC
3 refills | Status: DC
Start: 2023-11-19 — End: 2024-05-02

## 2023-11-19 MED ORDER — FREESTYLE LIBRE 2 READER DEVI: 1 refills | Status: DC

## 2023-11-19 MED ORDER — TIRZEPATIDE 10 MG/0.5ML ~~LOC~~ SOAJ: 10.0000 mg | SUBCUTANEOUS | 0 refills | Status: DC

## 2023-11-19 NOTE — Telephone Encounter (Signed)
 Requested medication (s) are due for refill today: Yes  Requested medication (s) are on the active medication list: Yes  Last refill:  11/19/23  Future visit scheduled: Yes  Notes to clinic:  Unable to refill due to no refill protocol for this medication. Pharmacy needs clarification.     Requested Prescriptions  Pending Prescriptions Disp Refills   MOUNJARO 10 MG/0.5ML Pen [Pharmacy Med Name: MOUNJARO 10 MG/0.5 ML PEN]  0    Sig: INJECT 10 MG INTO THE SKIN ONE TIME PER WEEK     Off-Protocol Failed - 11/19/2023  4:01 PM      Failed - Medication not assigned to a protocol, review manually.      Failed - Valid encounter within last 12 months    Recent Outpatient Visits           Today Type 2 diabetes mellitus with stage 3a chronic kidney disease, with long-term current use of insulin (HCC)    [facility] [place], Oralia Rud, DO       Future Appointments             In 2 weeks Gollan, Tollie Pizza, MD [facility]

## 2023-11-19 NOTE — Progress Notes (Signed)
 BP 110/86 (BP Location: Left Arm, Patient Position: Sitting, Cuff Size: Normal)   Pulse 78   Temp 97.7 F (36.5 C) (Oral)   Ht 5\' 5"  (1.651 m)   Wt 184 lb 6.4 oz (83.6 kg)   SpO2 97%   BMI 30.69 kg/m    Subjective:    Patient ID: Bonnie Ochoa, female    DOB: 1966-06-22, 58 y.o.   MRN: [id number]  HPI: Bonnie Ochoa is a 58 y.o. female  Chief Complaint  Patient presents with   Diabetes   DIABETES- has not been taking her mounjaro for unknown amount of time. Thinks she has been taking ozempic. Unsure what's going on with her medicine Hypoglycemic episodes:no Polydipsia/polyuria: no Visual disturbance: no Chest pain: yes Paresthesias: no Glucose Monitoring: no  Accucheck frequency: Not Checking Taking Insulin?: yes  Long acting insulin: 45 units of lantus daily Blood Pressure Monitoring: not checking Retinal Examination: Not up to Date Foot Exam: Up to Date Diabetic Education: Completed Pneumovax: Up to Date Influenza: Up to Date Aspirin: no  Got in the shower and started feeling weird, then passed out. Did not hit her head. Saw EP cardiology on Friday. Due to see primary cardiologist in 2 weeks.   Relevant past medical, surgical, family and social history reviewed and updated as indicated. Interim medical history since our last visit reviewed. Allergies and medications reviewed and updated.  Review of Systems  Constitutional: Negative.   Respiratory:  Positive for chest tightness. Negative for apnea, cough, choking, shortness of breath, wheezing and stridor.   Cardiovascular:  Positive for chest pain. Negative for palpitations and leg swelling.  Gastrointestinal: Negative.   Musculoskeletal: Negative.   Neurological:  Positive for syncope. Negative for dizziness, tremors, seizures, facial asymmetry, speech difficulty, weakness, light-headedness, numbness and headaches.  Psychiatric/Behavioral: Negative.      Per HPI unless specifically indicated above      Objective:    BP 110/86 (BP Location: Left Arm, Patient Position: Sitting, Cuff Size: Normal)   Pulse 78   Temp 97.7 F (36.5 C) (Oral)   Ht 5\' 5"  (1.651 m)   Wt 184 lb 6.4 oz (83.6 kg)   SpO2 97%   BMI 30.69 kg/m   Wt Readings from Last 3 Encounters:  11/19/23 184 lb 6.4 oz (83.6 kg)  11/14/23 187 lb (84.8 kg)  08/13/23 184 lb 12.8 oz (83.8 kg)    Physical Exam Vitals and nursing note reviewed.  Constitutional:      General: She is not in acute distress.    Appearance: Normal appearance. She is obese. She is not ill-appearing, toxic-appearing or diaphoretic.  HENT:     Head: Normocephalic and atraumatic.     Right Ear: External ear normal.     Left Ear: External ear normal.     Nose: Nose normal.     Mouth/Throat:     Mouth: Mucous membranes are moist.     Pharynx: Oropharynx is clear.  Eyes:     General: No scleral icterus.       Right eye: No discharge.        Left eye: No discharge.     Extraocular Movements: Extraocular movements intact.     Conjunctiva/sclera: Conjunctivae normal.     Pupils: Pupils are equal, round, and reactive to light.  Cardiovascular:     Rate and Rhythm: Normal rate and regular rhythm.     Pulses: Normal pulses.     Heart sounds: Normal heart sounds. No murmur heard.  No friction rub. No gallop.  Pulmonary:     Effort: Pulmonary effort is normal. No respiratory distress.     Breath sounds: Normal breath sounds. No stridor. No wheezing, rhonchi or rales.  Chest:     Chest wall: No tenderness.  Musculoskeletal:        General: Normal range of motion.     Cervical back: Normal range of motion and neck supple.  Skin:    General: Skin is warm and dry.     Capillary Refill: Capillary refill takes less than 2 seconds.     Coloration: Skin is not jaundiced or pale.     Findings: No bruising, erythema, lesion or rash.  Neurological:     General: No focal deficit present.     Mental Status: She is alert and oriented to person, place, and  time. Mental status is at baseline.  Psychiatric:        Mood and Affect: Mood normal.        Behavior: Behavior normal.        Thought Content: Thought content normal.        Judgment: Judgment normal.     Results for orders placed or performed in visit on 11/14/23  Digoxin level   Collection Time: 11/14/23  9:57 AM  Result Value Ref Range   Digoxin, Serum 1.0 (H) 0.5 - 0.9 ng/mL  Basic metabolic panel with GFR   Collection Time: 11/14/23  9:57 AM  Result Value Ref Range   Glucose 356 (H) 70 - 99 mg/dL   BUN 24 6 - 24 mg/dL   Creatinine, Ser 5.57 (H) 0.57 - 1.00 mg/dL   eGFR 49 (L) >32 KG/URK/2.70   BUN/Creatinine Ratio 19 9 - 23   Sodium 138 134 - 144 mmol/L   Potassium 4.5 3.5 - 5.2 mmol/L   Chloride 103 96 - 106 mmol/L   CO2 19 (L) 20 - 29 mmol/L   Calcium 9.3 8.7 - 10.2 mg/dL      Assessment & Plan:   Problem List Items Addressed This Visit       Endocrine   DM (diabetes mellitus), type 2 with renal complications (HCC) - Primary   Not under good control with A1c of 9.4 up from 8.9. Has not been on her mounjaro for unknown amount of time. Has been taking old Rx of ozempic- unclear what dose she's been on. Will get her hooked up with pharmacy- phone call happening now in the office. Will get her on continous glucose monitor and get her back on her mounjaro. Given that we don't know what dose she was taking, will start her on 10mg  mounjaro and titrate up to 15mg . Will check back in in 6 weeks and follow up with A1c in 3 months.       Relevant Medications   tirzepatide [facility]) 10 MG/0.5ML Pen   tirzepatide [facility]) 12.5 MG/0.5ML Pen (Start on 12/19/2023)   tirzepatide [facility]) 15 MG/0.5ML Pen (Start on 01/19/2024)   Other Relevant Orders   Bayer DCA Hb A1c Waived   Other Visit Diagnoses       Syncope, unspecified syncope type       Saw EP cardiology 5 days ago and discussed- to see general cardiology 4/22. Keep that appointment. Discussed importance of keeping  sugars under good control.        Follow up plan: Return in about 6 weeks (around 12/31/2023), or ASAP for annual wellness with Gunnar Fusi. 3 months with me.  25 minutes  spent with patient and sister today.

## 2023-11-19 NOTE — Progress Notes (Signed)
   11/19/2023  Patient ID: Bonnie Ochoa, female   DOB: 03-27-1966, 58 y.o.   MRN: [id number]  Subjective/Objective: Telephone visit with patient while she was at [facility] for follow-up with PCP  Diabetes Management Plan -Current medications:  Lantus 45 units at bedtime -Mounjaro has been prescribed for patient, but pharmacy has not filled; and patient endorses currently taking Ozempic 2mg  -No fill history through insurance or pharmacy reflecting recent dispensing of Ozempic (any strength) -Patient does check home BG but not regularly; patient and PCP would like to initiate CGM to help with regular monitoring -A1c 8.9% 12/31- lab to be drawn today at visit  Assessment/Plan  Diabetes Management Plan -Contacted pharmacy, and Greggory Keen will go through on insurance at $0- I recommend starting 10mg  weekly IF patient has been consistently using Ozempic 2mg  weekly -Can titrate every 4 weeks, as tolerated, up to 15mg  weekly- decreasing insulin based on A1c/BG readings -Libre 3+ sensors going through on insurance for $0 copay; but they need prescription for Remer 3 reader Josephine Igo 2 reader was sent in).  Sending under [facility] standing order for CGM.  Follow-up:  Attempted to call patient to verify most recent Ozempic dose of 2mg  and that this has been taken consistently for at least 4 weeks before switching to [facility] 10mg  weekly.  I was not able to reach the patient but left a voicemail with my direct number.  Will try to call again tomorrow if I do not hear back.  Lenna Gilford, PharmD, DPLA

## 2023-11-19 NOTE — Telephone Encounter (Signed)
 See message from the pharmacy on the request.

## 2023-11-19 NOTE — Assessment & Plan Note (Addendum)
 Not under good control with A1c of 9.4 up from 8.9. Has not been on her mounjaro for unknown amount of time. Has been taking old Rx of ozempic- unclear what dose she's been on. Will get her hooked up with pharmacy- phone call happening now in the office. Will get her on continous glucose monitor and get her back on her mounjaro. Given that we don't know what dose she was taking, will start her on 10mg  mounjaro and titrate up to 15mg . Will check back in in 6 weeks and follow up with A1c in 3 months.

## 2023-11-20 ENCOUNTER — Telehealth: Payer: Self-pay

## 2023-11-20 NOTE — Progress Notes (Signed)
   11/20/2023  Patient ID: Bonnie Ochoa, female   DOB: 03-28-1966, 58 y.o.   MRN: [id number]  Patient outreach to verify Ozempic dose patient has been using.  Patient endorses having Ozempic 1mg  pen and states her last dose of this medication was last Thursday, and she will be due for a dose tomorrow.  Patient does not endorse any significant gap in therapy and states she has been on this dose approximately 1 year and tolerates well.  I informed her that Mounjaro 10mg  and Josephine Igo 3 are going through on insurance at $0 copay for her, and I will reach out Friday to verify she was able to pick the prescriptions up and set up/administer okay.  Lenna Gilford, PharmD, DPLA

## 2023-11-22 ENCOUNTER — Other Ambulatory Visit: Payer: Self-pay

## 2023-11-22 ENCOUNTER — Other Ambulatory Visit

## 2023-11-22 NOTE — Progress Notes (Signed)
   11/22/2023  Patient ID: Bonnie Ochoa, female   DOB: [birth year], 58 y.o.   MRN: [id number]  Patient outreach to verify [name] was able to pick up Mounjaro 10 mg and Freestyle Libre 3 CGM.  Patient states pharmacy never notified her/her sister that prescriptions were ready.  Contacted [facility], and these prescriptions are ready at $0 copay.  Sending patient a MyChart message to notify and schedule a follow-up visit in 2-3 weeks.  Lenna Gilford, PharmD, DPLA

## 2023-11-24 ENCOUNTER — Other Ambulatory Visit: Payer: Self-pay | Admitting: Family Medicine

## 2023-11-25 ENCOUNTER — Other Ambulatory Visit: Payer: Self-pay | Admitting: Family Medicine

## 2023-11-25 MED ORDER — NITROGLYCERIN 0.4 MG/SPRAY TL SOLN: 1.0000 | 12 refills | Status: DC | PRN

## 2023-11-25 NOTE — Telephone Encounter (Signed)
 Requested Prescriptions  Pending Prescriptions Disp Refills   empagliflozin (JARDIANCE) 25 MG TABS tablet [Pharmacy Med Name: JARDIANCE 25 MG TABLET] 90 tablet 0    Sig: TAKE 1 TABLET (25 MG TOTAL) BY MOUTH DAILY.     Endocrinology:  Diabetes - SGLT2 Inhibitors Failed - 11/25/2023  4:23 PM      Failed - Cr in normal range and within 360 days    Creatinine, Ser  Date Value Ref Range Status  11/14/2023 1.26 (H) 0.57 - 1.00 mg/dL Final   Creatinine, Urine  Date Value Ref Range Status  07/02/2017 128 mg/dL Final         Failed - HBA1C is between 0 and 7.9 and within 180 days    Hemoglobin A1C  Date Value Ref Range Status  02/19/2018 7.8  Final   HB A1C (BAYER DCA - WAIVED)  Date Value Ref Range Status  11/19/2023 9.4 (H) 4.8 - 5.6 % Final    Comment:             Prediabetes: 5.7 - 6.4          Diabetes: >6.4          Glycemic control for adults with diabetes: <7.0          Failed - eGFR in normal range and within 360 days    GFR calc Af Amer  Date Value Ref Range Status  07/11/2020 60 >59 mL/min/1.73 Final    Comment:    **In accordance with recommendations from the NKF-ASN Task force,**   [facility] is in the process of updating its eGFR calculation to the   2021 CKD-EPI creatinine equation that estimates kidney function   without a race variable.    GFR, Estimated  Date Value Ref Range Status  01/04/2023 32 (L) >60 mL/min Final    Comment:    (NOTE) Calculated using the CKD-EPI Creatinine Equation (2021)    eGFR  Date Value Ref Range Status  11/14/2023 49 (L) >59 mL/min/1.73 Final         Failed - Valid encounter within last 6 months    Recent Outpatient Visits           6 days ago Type 2 diabetes mellitus with stage 3a chronic kidney disease, with long-term current use of insulin (HCC)   [place] [facility] [place], [place], DO       Future Appointments             In 4 days Gollan, Timothy J, MD [facility]

## 2023-11-26 ENCOUNTER — Ambulatory Visit: Admitting: Emergency Medicine

## 2023-11-26 VITALS — Ht 65.0 in | Wt 185.0 lb

## 2023-11-26 DIAGNOSIS — Z0000 Encounter for general adult medical examination without abnormal findings: Secondary | ICD-10-CM

## 2023-11-26 DIAGNOSIS — Z794 Long term (current) use of insulin: Secondary | ICD-10-CM

## 2023-11-26 DIAGNOSIS — Z1211 Encounter for screening for malignant neoplasm of colon: Secondary | ICD-10-CM

## 2023-11-26 NOTE — Patient Instructions (Addendum)
 Bonnie Ochoa , Thank you for taking time to come for your Medicare Wellness Visit. I appreciate your ongoing commitment to your health goals. Please review the following plan we discussed and let me know if I can assist you in the future.   Referrals/Orders/Follow-Ups/Clinician Recommendations: I have placed a referral to diabetic and nutrition education. Someone from their office will call you. I have placed a referral to Gastroenterology to schedule a colonoscopy. Someone from their office will contact you. You need another pneumonia vaccine. Get this at your convenience or at your next OV on 12/03/23.  This is a list of the screening recommended for you and due dates:  Health Maintenance  Topic Date Due   Pneumococcal Vaccination (2 of 2 - PCV) 07/04/2018   COVID-19 Vaccine (5 - 2024-25 season) 04/13/2024*   Zoster (Shingles) Vaccine (2 of 2) 01/14/2024   Flu Shot  03/13/2024   Complete foot exam   05/15/2024   Hemoglobin A1C  05/20/2024   Yearly kidney health urinalysis for diabetes  08/12/2024   Eye exam for diabetics  10/09/2024   Yearly kidney function blood test for diabetes  11/13/2024   Medicare Annual Wellness Visit  11/25/2024   Mammogram  05/22/2025   DTaP/Tdap/Td vaccine (2 - Tdap) 04/07/2028   Colon Cancer Screening  03/10/2029   Hepatitis C Screening  Completed   HIV Screening  Completed   HPV Vaccine  Aged Out   Meningitis B Vaccine  Aged Out  *Topic was postponed. The date shown is not the original due date.    Advanced directives: (Provided) Advance directive discussed with you today. I have provided a copy for you to complete at home and have notarized. Once this is complete, please bring a copy in to our office so we can scan it into your chart.   Next Medicare Annual Wellness Visit scheduled for next year: Yes, 12/01/24 @ 8:40am (phone visit)  Fall Prevention in the Home, Adult Falls can cause injuries and affect people of all ages. There are many simple things that  you can do to make your home safe and to help prevent falls. If you need it, ask for help making these changes. What actions can I take to prevent falls? General information Use good lighting in all rooms. Make sure to: Replace any light bulbs that burn out. Turn on lights if it is dark and use night-lights. Keep items that you use often in easy-to-reach places. Lower the shelves around your home if needed. Move furniture so that there are clear paths around it. Do not keep throw rugs or other things on the floor that can make you trip. If any of your floors are uneven, fix them. Add color or contrast paint or tape to clearly mark and help you see: Grab bars or handrails. First and last steps of staircases. Where the edge of each step is. If you use a ladder or stepladder: Make sure that it is fully opened. Do not climb a closed ladder. Make sure the sides of the ladder are locked in place. Have someone hold the ladder while you use it. Know where your pets are as you move through your home. What can I do in the bathroom?     Keep the floor dry. Clean up any water that is on the floor right away. Remove soap buildup in the bathtub or shower. Buildup makes bathtubs and showers slippery. Use non-skid mats or decals on the floor of the bathtub or shower. Attach  bath mats securely with double-sided, non-slip rug tape. If you need to sit down while you are in the shower, use a non-slip stool. Install grab bars by the toilet and in the bathtub and shower. Do not use towel bars as grab bars. What can I do in the bedroom? Make sure that you have a light by your bed that is easy to reach. Do not use any sheets or blankets on your bed that hang to the floor. Have a firm bench or chair with side arms that you can use for support when you get dressed. What can I do in the kitchen? Clean up any spills right away. If you need to reach something above you, use a sturdy step stool that has a grab  bar. Keep electrical cables out of the way. Do not use floor polish or wax that makes floors slippery. What can I do with my stairs? Do not leave anything on the stairs. Make sure that you have a light switch at the top and the bottom of the stairs. Have them installed if you do not have them. Make sure that there are handrails on both sides of the stairs. Fix handrails that are broken or loose. Make sure that handrails are as long as the staircases. Install non-slip stair treads on all stairs in your home if they do not have carpet. Avoid having throw rugs at the top or bottom of stairs, or secure the rugs with carpet tape to prevent them from moving. Choose a carpet design that does not hide the edge of steps on the stairs. Make sure that carpet is firmly attached to the stairs. Fix any carpet that is loose or worn. What can I do on the outside of my home? Use bright outdoor lighting. Repair the edges of walkways and driveways and fix any cracks. Clear paths of anything that can make you trip, such as tools or rocks. Add color or contrast paint or tape to clearly mark and help you see high doorway thresholds. Trim any bushes or trees on the main path into your home. Check that handrails are securely fastened and in good repair. Both sides of all steps should have handrails. Install guardrails along the edges of any raised decks or porches. Have leaves, snow, and ice cleared regularly. Use sand, salt, or ice melt on walkways during winter months if you live where there is ice and snow. In the garage, clean up any spills right away, including grease or oil spills. What other actions can I take? Review your medicines with your health care provider. Some medicines can make you confused or feel dizzy. This can increase your chance of falling. Wear closed-toe shoes that fit well and support your feet. Wear shoes that have rubber soles and low heels. Use a cane, walker, scooter, or crutches that  help you move around if needed. Talk with your provider about other ways that you can decrease your risk of falls. This may include seeing a physical therapist to learn to do exercises to improve movement and strength. Where to find more information Centers for Disease Control and Prevention, [employer]AD[employer]: TonerPromos.no [employer] on Aging: BaseRingTones.pl National Institute on Aging: BaseRingTones.pl Contact a health care provider if: You are afraid of falling at home. You feel weak, drowsy, or dizzy at home. You fall at home. Get help right away if you: Lose consciousness or have trouble moving after a fall. Have a fall that causes a head injury. These symptoms may be  an emergency. Get help right away. Call 911. Do not wait to see if the symptoms will go away. Do not drive yourself to the hospital. This information is not intended to replace advice given to you by your health care provider. Make sure you discuss any questions you have with your health care provider. Document Revised: 04/02/2022 Document Reviewed: 04/02/2022 Elsevier Patient Education  2024 [employer].

## 2023-11-26 NOTE — Progress Notes (Signed)
 Subjective:   Bonnie Ochoa is a 58 y.o. who presents for a Medicare Wellness preventive visit.  Visit Complete: Virtual I connected with  Bonnie Ochoa on 11/26/23 by a audio enabled telemedicine application and verified that I am speaking with the correct person using two identifiers.  Patient Location: Home  Provider Location: Office/Clinic  I discussed the limitations of evaluation and management by telemedicine. The patient expressed understanding and agreed to proceed.  Vital Signs: Because this visit was a virtual/telehealth visit, some criteria may be missing or patient reported. Any vitals not documented were not able to be obtained and vitals that have been documented are patient reported.  VideoDeclined- This patient declined Librarian, academic. Therefore the visit was completed with audio only.  Persons Participating in Visit: Patient.  AWV Questionnaire: Yes: Patient Medicare AWV questionnaire was completed by the patient on 11/25/23; I have confirmed that all information answered by patient is correct and no changes since this date.  Cardiac Risk Factors include: hypertension;obesity (BMI >30kg/m2);diabetes mellitus;Other (see comment), Risk factor comments: OSA (cpap is broken)     Objective:    Today's Vitals   11/26/23 0840 11/26/23 0841  Weight: 185 lb (83.9 kg)   Height: 5\' 5"  (1.651 m)   PainSc:  7    Body mass index is 30.79 kg/m.     11/26/2023    9:03 AM 01/04/2023    7:20 PM 11/07/2022    7:02 PM 10/15/2022    8:57 AM 08/31/2022    5:59 AM 09/11/2021   10:24 AM 10/26/2020   10:42 AM  Advanced Directives  Does Patient Have a Medical Advance Directive? No No No No No No No  Would patient like information on creating a medical advance directive? Yes (MAU/Ambulatory/Procedural Areas - Information given)   No - Patient declined No - Patient declined No - Patient declined Yes (MAU/Ambulatory/Procedural Areas - Information given)     Current Medications (verified) Outpatient Encounter Medications as of 11/26/2023  Medication Sig   acetaminophen (TYLENOL) 325 MG tablet Take 650 mg by mouth every 6 (six) hours as needed.   albuterol (VENTOLIN HFA) 108 (90 Base) MCG/ACT inhaler TAKE 2 PUFFS BY MOUTH EVERY 6 HOURS AS NEEDED FOR WHEEZE OR SHORTNESS OF BREATH   blood glucose meter kit and supplies KIT Dispense based on patient and insurance preference. Use up to four times daily as directed. (FOR ICD-9 250.00, 250.01).   carvedilol (COREG) 12.5 MG tablet TAKE 1 TABLET (12.5MG  TOTAL) BY MOUTH TWICE A DAY WITH MEALS   cyclobenzaprine (FLEXERIL) 5 MG tablet TAKE 1 TABLET BY MOUTH THREE TIMES A DAY AS NEEDED FOR MUSCLE SPASM   digoxin (LANOXIN) 0.125 MG tablet TAKE 1 TABLET BY MOUTH EVERY DAY   empagliflozin (JARDIANCE) 25 MG TABS tablet TAKE 1 TABLET (25 MG TOTAL) BY MOUTH DAILY.   famotidine (PEPCID) 40 MG tablet TAKE 1 TABLET BY MOUTH EVERY DAY   furosemide (LASIX) 20 MG tablet TAKE 1 TABLET (20 MG TOTAL) BY MOUTH AS DIRECTED. TAKE 20MG  (1 TABLET) MONDAY, WEDNESDAY, FRIDAY.   gabapentin (NEURONTIN) 100 MG capsule Take 1 capsule (100 mg total) by mouth at bedtime.   insulin glargine (LANTUS SOLOSTAR) 100 UNIT/ML Solostar Pen Inject 45 Units into the skin daily at 10 pm.   Insulin Pen Needle 32G X 6 MM MISC 1 each by Does not apply route daily.   lamoTRIgine (LAMICTAL) 25 MG tablet TAKE 1 TABLET (25 MG TOTAL) BY MOUTH DAILY.   metoCLOPramide (  REGLAN) 10 MG tablet Take 10 mg by mouth 4 (four) times daily.   mirtazapine (REMERON) 7.5 MG tablet Take 1 tablet (7.5 mg total) by mouth at bedtime.   nitroGLYCERIN (NITROLINGUAL) 0.4 MG/SPRAY spray Place 1 spray under the tongue every 5 (five) minutes x 3 doses as needed for chest pain.   pantoprazole (PROTONIX) 40 MG tablet Take 1 tablet (40 mg total) by mouth daily.   potassium chloride SA (KLOR-CON M20) 20 MEQ tablet TAKE 2 TABLETS BY MOUTH TWICE A DAY   rosuvastatin (CRESTOR) 40 MG  tablet TAKE 1 TABLET BY MOUTH EVERY DAY   sacubitril-valsartan (ENTRESTO) 49-51 MG TAKE 1 TABLET BY MOUTH TWICE A DAY   spironolactone (ALDACTONE) 25 MG tablet TAKE 1 TABLET (25 MG TOTAL) BY MOUTH DAILY.   sucralfate (CARAFATE) 1 g tablet TAKE 1 TABLET (1 G TOTAL) BY MOUTH 4 (FOUR) TIMES DAILY.   tirzepatide (MOUNJARO) 10 MG/0.5ML Pen INJECT 10 MG INTO THE SKIN ONE TIME PER WEEK   baclofen (LIORESAL) 10 MG tablet TAKE 1/2 TO 1 TABLET (5-10 MG TOTAL) BY MOUTH EVERY DAY AT BEDTIME AS NEEDED FOR MUSCLE SPASM (Patient not taking: Reported on 11/26/2023)   Continuous Glucose Receiver (FREESTYLE LIBRE 3 READER) DEVI Use to monitor blood glucose.  E11.95 Type 2 Diabetes. (Patient not taking: Reported on 11/26/2023)   Continuous Glucose Sensor (FREESTYLE LIBRE 3 SENSOR) MISC Place 1 sensor on the skin every 14 days. Use to check glucose continuously (Patient not taking: Reported on 11/26/2023)   diclofenac Sodium (VOLTAREN) 1 % GEL Apply 4 g topically 4 (four) times daily. (Patient not taking: Reported on 11/26/2023)   [START ON 12/19/2023] tirzepatide (MOUNJARO) 12.5 MG/0.5ML Pen Inject 12.5 mg into the skin once a week. (Patient not taking: Reported on 11/26/2023)   [START ON 01/19/2024] tirzepatide (MOUNJARO) 15 MG/0.5ML Pen Inject 15 mg into the skin once a week. (Patient not taking: Reported on 11/26/2023)   No facility-administered encounter medications on file as of 11/26/2023.    Allergies (verified) Levothyroxine   History: Past Medical History:  Diagnosis Date   Abdominal pain    a. 05/2018 HIDA scan wnl.   ADHD    AICD (automatic cardioverter/defibrillator) present    a. 11/2014 s/p SJM Fortify Assura, single lead AICD (ser# [id number]).   Anemia    Arthritis    Asthma    Bipolar 1 disorder (HCC)    Chest pain    a. Hx of cath in [place] - reportedly nl; b. 04/2018 MV: EF 22%, fixed dist ant septal, apical, and inferoapical defects - ? scar vs. attenuation. No ischemia.   CHF (congestive heart failure)  (HCC)    Cirrhosis of liver (HCC)    Coronary artery disease    Depression    Diabetes mellitus without complication (HCC)    Diverticulitis    Dysrhythmia    Heart murmur    HFrEF (heart failure with reduced ejection fraction) (HCC)    a. 06/2017 Echo: EF 20-25%, diff HK. Mildly dil LA; b. 05/2018 Echo: EF 25-30%, diff HK, Gr2 DD. Triv AI. Mod MR. Mildly reduced RV fxn. Mod-Sev TR. PASP 45-60mmHg.   Hypertension    Hypothyroidism    IBS (irritable bowel syndrome)    Insomnia    Migraines    NICM (nonischemic cardiomyopathy) (HCC)    a. EF prev 25%; b. 11/2014 s/p SJM Fortify Assura, single lead AICD (ser# [id number]); c. 06/2017 Echo: EF 20-25%; d. 05/2018 Echo: EF 25-30%, diff HK, Gr2 DD; e.  05/2018 Echo: EF 25-30%; f. 05/2018 Cath: Nl cors. LVEDP , PCWP . PA 65/40 (52). CO/CI 3.04/1.56; c. 07/2018 CPX: Mod HF limitation.   OSA (obstructive sleep apnea)    CPAP is broken   PTSD (post-traumatic stress disorder)    Restless leg syndrome    Vertigo    Past Surgical History:  Procedure Laterality Date   ABDOMINAL HYSTERECTOMY     BREAST BIOPSY Left    CARDIAC CATHETERIZATION     CATARACT EXTRACTION W/PHACO Right 01/27/2019   Procedure: CATARACT EXTRACTION PHACO AND INTRAOCULAR LENS PLACEMENT (IOC)  VISION BLUE RIGHT DIABETES;  Surgeon: Galen Manila, MD;  Location: [facility];  Service: Ophthalmology;  Laterality: Right;  Diabetes - insulin sleep apnea   CATARACT EXTRACTION W/PHACO Left 03/08/2020   Procedure: CATARACT EXTRACTION PHACO AND INTRAOCULAR LENS PLACEMENT (IOC) LEFT DIABETIC 10.28  00:57.8;  Surgeon: Galen Manila, MD;  Location: [facility];  Service: Ophthalmology;  Laterality: Left;   COLONOSCOPY WITH PROPOFOL N/A 03/11/2019   Procedure: COLONOSCOPY WITH PROPOFOL;  Surgeon: Pasty Spillers, MD;  Location: [facility] ENDOSCOPY;  Service: Endoscopy;  Laterality: N/A;   CORONARY ARTERY BYPASS GRAFT     Pt denies    ESOPHAGOGASTRODUODENOSCOPY (EGD) WITH PROPOFOL N/A 03/11/2019   Procedure: ESOPHAGOGASTRODUODENOSCOPY (EGD) WITH PROPOFOL;  Surgeon: Pasty Spillers, MD;  Location: [facility] ENDOSCOPY;  Service: Endoscopy;  Laterality: N/A;   INSERT / REPLACE / REMOVE PACEMAKER     ICD   INSERTION OF ICD  2016   St Jude Single chamber ICD   RIGHT/LEFT HEART CATH AND CORONARY ANGIOGRAPHY N/A 06/09/2018   Procedure: RIGHT/LEFT HEART CATH AND CORONARY ANGIOGRAPHY;  Surgeon: Iran Ouch, MD;  Location: [facility] INVASIVE CV LAB;  Service: Cardiovascular;  Laterality: N/A;   TONSILLECTOMY     TUBAL LIGATION  1980   Family History  Problem Relation Age of Onset   Hypertension Mother    Hyperlipidemia Mother    Heart disease Mother    Hypertension Sister    Asthma Sister    Heart disease Sister    Diabetes Sister    Cancer Sister    Alzheimer's disease Maternal Grandfather    Hyperlipidemia Brother    Asthma Sister    Hypertension Sister    Diabetes Sister    Breast cancer Paternal Aunt    Social History   Socioeconomic History   Marital status: Married    Spouse name: Latricia   Number of children: 2   Years of education: Not on file   Highest education level: 12th grade  Occupational History    Comment: disabled  Tobacco Use   Smoking status: Former    Current packs/day: 0.00    Average packs/day: 1 pack/day for 2.0 years (2.0 ttl pk-yrs)    Types: Cigarettes    Start date: 65    Quit date: 1985    Years since quitting: 40.3   Smokeless tobacco: Never  Vaping Use   Vaping status: Some Days   Substances: Mixture of cannabinoids  Substance and Sexual Activity   Alcohol use: Yes    Comment: 1-2 shot monthly   Drug use: Yes    Types: Marijuana    Comment: 1 joint weekly (smokes it 4-5 times a week)   Sexual activity: Yes    Birth control/protection: Surgical    Comment: one partner  Other Topics Concern   Not on file  Social History Narrative   Volunteers occasionally     Social Drivers of Health  Financial Resource Strain: Low Risk  (11/26/2023)   Overall Financial Resource Strain (CARDIA)    Difficulty of Paying Living Expenses: Not very hard  Recent Concern: Financial Resource Strain - Medium Risk (10/01/2023)   Overall Financial Resource Strain (CARDIA)    Difficulty of Paying Living Expenses: Somewhat hard  Food Insecurity: No Food Insecurity (11/26/2023)   Hunger Vital Sign    Worried About Running Out of Food in the Last Year: Never true    Ran Out of Food in the Last Year: Never true  Recent Concern: Food Insecurity - Food Insecurity Present (10/01/2023)   Hunger Vital Sign    Worried About Running Out of Food in the Last Year: Sometimes true    Ran Out of Food in the Last Year: Sometimes true  Transportation Needs: No Transportation Needs (11/26/2023)   PRAPARE - Administrator, Civil Service (Medical): No    Lack of Transportation (Non-Medical): No  Physical Activity: Inactive (11/26/2023)   Exercise Vital Sign    Days of Exercise per Week: 0 days    Minutes of Exercise per Session: 0 min  Stress: No Stress Concern Present (11/26/2023)   [employer] of Occupational Health - Occupational Stress Questionnaire    Feeling of Stress : Not at all  Social Connections: Moderately Integrated (11/26/2023)   Social Connection and Isolation Panel [NHANES]    Frequency of Communication with Friends and Family: More than three times a week    Frequency of Social Gatherings with Friends and Family: Twice a week    Attends Religious Services: 1 to 4 times per year    Active Member of [employer] or Organizations: No    Attends Banker Meetings: Never    Marital Status: Married  Recent Concern: Social Connections - Moderately Isolated (10/01/2023)   Social Connection and Isolation Panel [NHANES]    Frequency of Communication with Friends and Family: Once a week    Frequency of Social Gatherings with Friends and Family: Once a week     Attends Religious Services: 1 to 4 times per year    Active Member of [employer] or Organizations: No    Attends Engineer, structural: Not on file    Marital Status: Married    Tobacco Counseling Counseling given: Not Answered    Clinical Intake:  Pre-visit preparation completed: Yes  Pain : 0-10 Pain Score: 7  Pain Type: Chronic pain, Acute pain Pain Location: Abdomen Pain Descriptors / Indicators: Aching     BMI - recorded: 30.79 Nutritional Status: BMI > 30  Obese Nutritional Risks: Nausea/ vomitting/ diarrhea (nausea) Diabetes: Yes CBG done?: No Did pt. bring in CBG monitor from home?: No  Lab Results  Component Value Date   HGBA1C 9.4 (H) 11/19/2023   HGBA1C 8.9 (H) 08/13/2023   HGBA1C 6.9 (H) 05/16/2023     How often do you need to have someone help you when you read instructions, pamphlets, or other written materials from your doctor or pharmacy?: 1 - Never  Interpreter Needed?: No  Information entered by :: Jaunita Messier, CMA   Activities of Daily Living     11/26/2023    8:46 AM 11/25/2023    8:27 AM  In your present state of health, do you have any difficulty performing the following activities:  Hearing? 0 0  Vision? 0 0  Difficulty concentrating or making decisions? 1 1  Walking or climbing stairs? 0 0  Dressing or bathing? 0 0  Doing errands,  shopping? 1 1  Comment sister or wife takes to appointments   Preparing Food and eating ? N N  Using the Toilet? N N  In the past six months, have you accidently leaked urine? Y Y  Comment occassionally   Do you have problems with loss of bowel control? N N  Managing your Medications? Malvin Johns  Comment sister fills pill box   Managing your Finances? Malvin Johns  Comment wife Chief Strategy Officer or managing your Housekeeping? N N    Patient Care Team: Dorcas Carrow, DO as PCP - General (Family Medicine) Duke Salvia, MD as PCP - Electrophysiology (Cardiology) Bensimhon, Bevelyn Buckles, MD as  PCP - Advanced Heart Failure (Cardiology) Midge Minium, MD as Consulting Physician (Gastroenterology) Antonieta Iba, MD as Consulting Physician (Cardiology) Bridgett Larsson, LCSW as Social Worker (Licensed Clinical Social Worker) [facility] (Optometry)  Indicate any recent Medical Services you may have received from other than [facility] providers in the past year (date may be approximate).     Assessment:   This is a routine wellness examination for [place].  Hearing/Vision screen Hearing Screening - Comments:: Denies hearing loss Vision Screening - Comments:: Gets DM eye exams, [facility] [place]    Depression Screen     11/26/2023    9:00 AM 11/19/2023    1:28 PM 08/13/2023   10:57 AM 05/16/2023   10:04 AM 02/13/2023    8:39 AM 11/14/2022    8:35 AM 10/15/2022    8:55 AM  PHQ 2/9 Scores  PHQ - 2 Score 2 1 4 2 3 2  0  PHQ- 9 Score 6 5 6 6 12 4  0    Fall Risk     11/26/2023    9:05 AM 11/25/2023    8:27 AM 08/13/2023   10:57 AM 02/13/2023    8:39 AM 11/14/2022    8:35 AM  Fall Risk   Falls in the past year? 1 1 0 0 0  Number falls in past yr: 0 0 0 0 0  Injury with Fall? 1 0 0 0 0  Risk for fall due to : History of fall(s);Impaired balance/gait;Orthopedic patient  No Fall Risks No Fall Risks No Fall Risks  Follow up Falls prevention discussed;Falls evaluation completed;Education provided  Falls evaluation completed Falls evaluation completed Falls evaluation completed    MEDICARE RISK AT HOME:  Medicare Risk at Home Any stairs in or around the home?: Yes If so, are there any without handrails?: No Home free of loose throw rugs in walkways, pet beds, electrical cords, etc?: Yes Adequate lighting in your home to reduce risk of falls?: Yes Life alert?: No Use of a cane, walker or w/c?: No Grab bars in the bathroom?: No Shower chair or bench in shower?: No Elevated toilet seat or a handicapped toilet?: No  TIMED UP AND GO:  Was the test performed?   No  Cognitive Function: 6CIT completed        11/26/2023    9:06 AM 10/15/2022    9:04 AM 09/11/2021   10:08 AM 09/09/2020    9:51 AM 05/18/2019    3:15 PM  6CIT Screen  What Year? 0 points 0 points 0 points 0 points 0 points  What month? 0 points 0 points 0 points 0 points 0 points  What time? 0 points 0 points 0 points 0 points 0 points  Count back from 20 0 points 0 points 0 points 0 points  0 points  Months in reverse 2 points 0 points 0 points 4 points 0 points  Repeat phrase 4 points 0 points 4 points 8 points 0 points  Total Score 6 points 0 points 4 points 12 points 0 points    Immunizations Immunization History  Administered Date(s) Administered   Hepatitis B, ADULT 11/03/2018   Influenza, Seasonal, Injecte, Preservative Fre 05/07/2023   Influenza,inj,Quad PF,6+ Mos 05/31/2016, 05/02/2017, 07/04/2017, 04/07/2018, 06/13/2018, 04/13/2019, 05/09/2020, 05/18/2021, 05/15/2022   Janssen (J&J) SARS-COV-2 Vaccination 08/27/2019, 12/25/2019, 08/26/2020   Pfizer(Comirnaty)Fall Seasonal Vaccine 12 years and older 05/07/2023   Pneumococcal Polysaccharide-23 07/04/2017   Td 04/07/2018   Zoster Recombinant(Shingrix) 11/19/2023    Screening Tests Health Maintenance  Topic Date Due   Pneumococcal Vaccine 66-82 Years old (2 of 2 - PCV) 07/04/2018   COVID-19 Vaccine (5 - 2024-25 season) 04/13/2024 (Originally 11/04/2023)   Zoster Vaccines- Shingrix (2 of 2) 01/14/2024   INFLUENZA VACCINE  03/13/2024   FOOT EXAM  05/15/2024   HEMOGLOBIN A1C  05/20/2024   Diabetic kidney evaluation - Urine ACR  08/12/2024   OPHTHALMOLOGY EXAM  10/09/2024   Diabetic kidney evaluation - eGFR measurement  11/13/2024   Medicare Annual Wellness (AWV)  11/25/2024   MAMMOGRAM  05/22/2025   DTaP/Tdap/Td (2 - Tdap) 04/07/2028   Colonoscopy  03/10/2029   Hepatitis C Screening  Completed   HIV Screening  Completed   HPV VACCINES  Aged Out   Meningococcal B Vaccine  Aged Out    Health Maintenance  Health  Maintenance Due  Topic Date Due   Pneumococcal Vaccine 67-55 Years old (2 of 2 - PCV) 07/04/2018   Health Maintenance Items Addressed: Referral sent to GI for colonoscopy, See Nurse Notes  Additional Screening:  Vision Screening: Recommended annual ophthalmology exams for early detection of glaucoma and other disorders of the eye.  Dental Screening: Recommended annual dental exams for proper oral hygiene  Community Resource Referral / Chronic Care Management: CRR required this visit?  No   CCM required this visit?  No     Plan:     I have personally reviewed and noted the following in the patient's chart:   Medical and social history Use of alcohol, tobacco or illicit drugs  Current medications and supplements including opioid prescriptions. Patient is not currently taking opioid prescriptions. Functional ability and status Nutritional status Physical activity Advanced directives List of other physicians Hospitalizations, surgeries, and ER visits in previous 12 months Vitals Screenings to include cognitive, depression, and falls Referrals and appointments  In addition, I have reviewed and discussed with patient certain preventive protocols, quality metrics, and best practice recommendations. A written personalized care plan for preventive services as well as general preventive health recommendations were provided to patient.     Jaunita Messier, CMA   11/26/2023   After Visit Summary: (Mail) Due to this being a telephonic visit, the after visit summary with patients personalized plan was offered to patient via mail   Notes:  6 CIT Score - 6 Placed referral to DM & Nutrition Education Placed referral to GI for colonoscopy. Last colonoscopy was 03/11/19 with recommended repeat in 3 mths due to poor prep Patient getting MMGs every 2 years Needs pneumonia vaccine.

## 2023-11-26 NOTE — Telephone Encounter (Signed)
 Requested medication (s) are due for refill today: No  Requested medication (s) are on the active medication list: Yes  Last refill:  11/25/23  Future visit scheduled: Yes  Notes to clinic:  See pharmacy request.    Requested Prescriptions  Pending Prescriptions Disp Refills   nitroGLYCERIN (NITROSTAT) 0.3 MG SL tablet [Pharmacy Med Name: NITROGLYCERIN 0.3 MG TABLET SL]  0     Cardiovascular:  Nitrates Failed - 11/26/2023  3:24 PM      Failed - Valid encounter within last 12 months    Recent Outpatient Visits           1 week ago Type 2 diabetes mellitus with stage 3a chronic kidney disease, with long-term current use of insulin (HCC)   [facility] [place], [place], DO       Future Appointments             In 3 days Gollan, Timothy J, MD [facility] - Last BP in normal range    BP Readings from Last 1 Encounters:  11/19/23 110/86         Passed - Last Heart Rate in normal range    Pulse Readings from Last 1 Encounters:  11/19/23 78

## 2023-11-26 NOTE — Progress Notes (Signed)
 Cardiology Office Note  Date:  11/29/2023   ID:  Bonnie Ochoa, DOB 02/15/1966, MRN [id number]  PCP:  Solomon Dupre, DO   Cc: Syncope   HPI:  Bonnie Ochoa is a 58 yo woman with PMH Medical and appt noncompliance, Substance abuse-chronic /marijuana Previous alcohol problem ascites, cirrhosis, History of paracentesis bipolar,  anxiety diabetes, hypertension,  asthma,  Obstructive sleep apnea, restless leg syndrome Nonischemic cardiomyopathy,  history of ICD,  Cath 2019: no CAD ejection fraction 25%, 05/2018 EF  25 to 30% on 12/21 EF 30 to 35% in March 2023 anemia Previously on pain medication at home, oxycodone  She presents today for follow-up of her chronic systolic CHF  Last seen by myself in clinic 3/24 Seen by EP April 2025 Reports having episode of syncope 1 month ago Was in the shower, developed dizziness/near syncope symptoms, then passed out, partner found her in the shower, relatively uninjured  No further episodes  Recently started on Mounjaro  1 week ago  Medication list reviewed with her in detail - Continue spironolactone  to 25 mg daily.  - Continue carvedilol  12.5 mg twice a day. - Continue digoxin  0.125 mg daily.  - Continue Jardiance  25 daily - Change Lasix  20 mg to M/W/F, OK to take extra if needed - Continue Entresto  49/51 bid mg  Digoxin  level 1.0, was instructed to decrease digoxin  level in half, she did not get the message  Denies tachycardia concerning for arrhythmia  Prior records reviewed Echocardiogram March 2023 Ejection fraction 30 to 35% moderately dilated Prior EF 2021 was 25 to 30%  Other past medical history reviewed hospitalization October 25, 2018 for renal failure CR 2.15 Felt to be secondary to dehydration and improved with IV fluids She was continued on Lasix  at discharge   Other past medical history reviewed Previous hospitalization November 2018 for hyperglycemia nonketotic coma She had acute renal failure and  hyponatremia  Hospital admission 09/10/16 numerous sx on arrival to ER chest pain, shortness of breath, abdominal pain, dysuria, urinary frequency, syncope 2 today with lightheadedness, nausea and vomiting, and diarrhea , sob TBili 2.4, had paracentesis, diuresis D/c on lasix  60 BID   Hospital admission 09/17/16: chest pain, ABD pain fevers, chills, chest pain, shortness of breath, vomiting and diarrhea. "ran out of meds" HTN, acute on chronic systolic CHF   Seen in the emergency room 05/19/2016 for abdominal pain, chest pain BNP in the hospital 3700  acute on chronic CHF, Had 20 L diuresis   CT scan consistent with moderate abdominal ascites, fatty liver Aortic atherosclerosis   PMH:   has a past medical history of Abdominal pain, ADHD, AICD (automatic cardioverter/defibrillator) present, Anemia, Arthritis, Asthma, Bipolar 1 disorder (HCC), Chest pain, CHF (congestive heart failure) (HCC), Cirrhosis of liver (HCC), Coronary artery disease, Depression, Diabetes mellitus without complication (HCC), Diverticulitis, Dysrhythmia, Heart murmur, HFrEF (heart failure with reduced ejection fraction) (HCC), Hypertension, Hypothyroidism, IBS (irritable bowel syndrome), Insomnia, Migraines, NICM (nonischemic cardiomyopathy) (HCC), OSA (obstructive sleep apnea), PTSD (post-traumatic stress disorder), Restless leg syndrome, and Vertigo.  PSH:    Past Surgical History:  Procedure Laterality Date   ABDOMINAL HYSTERECTOMY     BREAST BIOPSY Left    CARDIAC CATHETERIZATION     CATARACT EXTRACTION W/PHACO Right 01/27/2019   Procedure: CATARACT EXTRACTION PHACO AND INTRAOCULAR LENS PLACEMENT (IOC)  VISION BLUE RIGHT DIABETES;  Surgeon: Clair Crews, MD;  Location: [facility];  Service: Ophthalmology;  Laterality: Right;  Diabetes - insulin  sleep apnea   CATARACT EXTRACTION W/PHACO  Left 03/08/2020   Procedure: CATARACT EXTRACTION PHACO AND INTRAOCULAR LENS PLACEMENT (IOC) LEFT DIABETIC 10.28   00:57.8;  Surgeon: Clair Crews, MD;  Location: [facility];  Service: Ophthalmology;  Laterality: Left;   COLONOSCOPY WITH PROPOFOL  N/A 03/11/2019   Procedure: COLONOSCOPY WITH PROPOFOL ;  Surgeon: Irby Mannan, MD;  Location: [facility] ENDOSCOPY;  Service: Endoscopy;  Laterality: N/A;   CORONARY ARTERY BYPASS GRAFT     Pt denies   ESOPHAGOGASTRODUODENOSCOPY (EGD) WITH PROPOFOL  N/A 03/11/2019   Procedure: ESOPHAGOGASTRODUODENOSCOPY (EGD) WITH PROPOFOL ;  Surgeon: Irby Mannan, MD;  Location: [facility] ENDOSCOPY;  Service: Endoscopy;  Laterality: N/A;   INSERT / REPLACE / REMOVE PACEMAKER     ICD   INSERTION OF ICD  2016   St Jude Single chamber ICD   RIGHT/LEFT HEART CATH AND CORONARY ANGIOGRAPHY N/A 06/09/2018   Procedure: RIGHT/LEFT HEART CATH AND CORONARY ANGIOGRAPHY;  Surgeon: Wenona Hamilton, MD;  Location: [facility] INVASIVE CV LAB;  Service: Cardiovascular;  Laterality: N/A;   TONSILLECTOMY     TUBAL LIGATION  1980    Current Outpatient Medications  Medication Sig Dispense Refill   acetaminophen  (TYLENOL ) 325 MG tablet Take 650 mg by mouth every 6 (six) hours as needed.     albuterol  (VENTOLIN  HFA) 108 (90 Base) MCG/ACT inhaler TAKE 2 PUFFS BY MOUTH EVERY 6 HOURS AS NEEDED FOR WHEEZE OR SHORTNESS OF BREATH 8.5 each 2   baclofen  (LIORESAL ) 10 MG tablet TAKE 1/2 TO 1 TABLET (5-10 MG TOTAL) BY MOUTH EVERY DAY AT BEDTIME AS NEEDED FOR MUSCLE SPASM 30 tablet 0   carvedilol  (COREG ) 12.5 MG tablet TAKE 1 TABLET (12.5MG  TOTAL) BY MOUTH TWICE A DAY WITH MEALS 180 tablet 2   Continuous Glucose Receiver (FREESTYLE LIBRE 3 READER) DEVI Use to monitor blood glucose.  E11.95 Type 2 Diabetes. 1 each 0   Continuous Glucose Sensor (FREESTYLE LIBRE 3 SENSOR) MISC Place 1 sensor on the skin every 14 days. Use to check glucose continuously 6 each 3   cyclobenzaprine  (FLEXERIL ) 5 MG tablet TAKE 1 TABLET BY MOUTH THREE TIMES A DAY AS NEEDED FOR MUSCLE SPASM 60 tablet 1   diclofenac  Sodium  (VOLTAREN ) 1 % GEL Apply 4 g topically 4 (four) times daily. 350 g 3   digoxin  (LANOXIN ) 0.125 MG tablet TAKE 1 TABLET BY MOUTH EVERY DAY 90 tablet 0   empagliflozin  (JARDIANCE ) 25 MG TABS tablet TAKE 1 TABLET (25 MG TOTAL) BY MOUTH DAILY. 90 tablet 0   famotidine  (PEPCID ) 40 MG tablet TAKE 1 TABLET BY MOUTH EVERY DAY 90 tablet 3   furosemide  (LASIX ) 20 MG tablet TAKE 1 TABLET (20 MG TOTAL) BY MOUTH AS DIRECTED. TAKE 20MG  (1 TABLET) MONDAY, WEDNESDAY, FRIDAY. 36 tablet 0   gabapentin  (NEURONTIN ) 100 MG capsule Take 1 capsule (100 mg total) by mouth at bedtime. 90 capsule 1   insulin  glargine (LANTUS  SOLOSTAR) 100 UNIT/ML Solostar Pen Inject 45 Units into the skin daily at 10 pm. 15 mL 6   Insulin  Pen Needle 32G X 6 MM MISC 1 each by Does not apply route daily. 100 each 12   lamoTRIgine  (LAMICTAL ) 25 MG tablet TAKE 1 TABLET (25 MG TOTAL) BY MOUTH DAILY. 90 tablet 2   metoCLOPramide  (REGLAN ) 10 MG tablet Take 10 mg by mouth 4 (four) times daily.     mirtazapine  (REMERON ) 7.5 MG tablet Take 1 tablet (7.5 mg total) by mouth at bedtime. 90 tablet 1   nitroGLYCERIN  (NITROSTAT ) 0.3 MG SL tablet Place 1 tablet (  0.3 mg total) under the tongue every 5 (five) minutes as needed for chest pain. 90 tablet 12   pantoprazole  (PROTONIX ) 40 MG tablet Take 1 tablet (40 mg total) by mouth daily. 90 tablet 1   potassium chloride  SA (KLOR-CON  M20) 20 MEQ tablet TAKE 2 TABLETS BY MOUTH TWICE A DAY 360 tablet 3   rosuvastatin  (CRESTOR ) 40 MG tablet TAKE 1 TABLET BY MOUTH EVERY DAY 90 tablet 2   sacubitril -valsartan  (ENTRESTO ) 49-51 MG TAKE 1 TABLET BY MOUTH TWICE A DAY 60 tablet 1   spironolactone  (ALDACTONE ) 25 MG tablet TAKE 1 TABLET (25 MG TOTAL) BY MOUTH DAILY. 90 tablet 2   sucralfate  (CARAFATE ) 1 g tablet TAKE 1 TABLET (1 G TOTAL) BY MOUTH 4 (FOUR) TIMES DAILY. 360 tablet 3   tirzepatide  (MOUNJARO ) 10 MG/0.5ML Pen INJECT 10 MG INTO THE SKIN ONE TIME PER WEEK 2 mL 0   blood glucose meter kit and supplies KIT  Dispense based on patient and insurance preference. Use up to four times daily as directed. (FOR ICD-9 250.00, 250.01). (Patient not taking: Reported on 11/29/2023) 1 each 0   [START ON 12/19/2023] tirzepatide  (MOUNJARO ) 12.5 MG/0.5ML Pen Inject 12.5 mg into the skin once a week. (Patient not taking: Reported on 11/19/2023) 2 mL 0   [START ON 01/19/2024] tirzepatide  (MOUNJARO ) 15 MG/0.5ML Pen Inject 15 mg into the skin once a week. (Patient not taking: Reported on 11/19/2023) 6 mL 1   No current facility-administered medications for this visit.    Allergies:   Levothyroxine   Social History:  The patient  reports that she quit smoking about 40 years ago. Her smoking use included cigarettes. She started smoking about 42 years ago. She has a 2 pack-year smoking history. She has never used smokeless tobacco. She reports current alcohol use. She reports current drug use. Drug: Marijuana.   Family History:   family history includes Alzheimer's disease in her maternal grandfather; Asthma in her sister and sister; Breast cancer in her paternal aunt; Cancer in her sister; Diabetes in her sister and sister; Heart disease in her mother and sister; Hyperlipidemia in her brother and mother; Hypertension in her mother, sister, and sister.   Review of Systems: Review of Systems  Constitutional:  Positive for weight loss.  HENT: Negative.    Respiratory: Negative.    Cardiovascular: Negative.   Gastrointestinal: Negative.   Musculoskeletal: Negative.   Neurological: Negative.   Psychiatric/Behavioral: Negative.    All other systems reviewed and are negative.   PHYSICAL EXAM: VS:  BP 112/76 (BP Location: Right Arm, Patient Position: Sitting, Cuff Size: Normal)   Pulse 80   Ht 5\' 5"  (1.651 m)   Wt 184 lb 6.4 oz (83.6 kg)   SpO2 100%   BMI 30.69 kg/m  , BMI Body mass index is 30.69 kg/m.  Constitutional:  oriented to person, place, and time. No distress.  HENT:  Head: Grossly normal Eyes:  no discharge.  No scleral icterus.  Neck: No JVD, no carotid bruits  Cardiovascular: Regular rate and rhythm, no murmurs appreciated Pulmonary/Chest: Clear to auscultation bilaterally, no wheezes or rails Abdominal: Soft.  no distension.  no tenderness.  Musculoskeletal: Normal range of motion Neurological:  normal muscle tone. Coordination normal. No atrophy Skin: Skin warm and dry Psychiatric: normal affect, pleasant  Recent Labs: 02/13/2023: Magnesium  2.4 08/13/2023: ALT 19; Hemoglobin 13.3; Platelets 205; TSH 2.240 11/14/2023: BUN 24; Creatinine, Ser 1.26; Potassium 4.5; Sodium 138    Lipid Panel Lab Results  Component  Value Date   CHOL 162 08/13/2023   HDL 32 (L) 08/13/2023   LDLCALC 76 08/13/2023   TRIG 334 (H) 08/13/2023     Wt Readings from Last 3 Encounters:  11/29/23 184 lb 6.4 oz (83.6 kg)  11/26/23 185 lb (83.9 kg)  11/19/23 184 lb 6.4 oz (83.6 kg)    ASSESSMENT AND PLAN:  Problem List Items Addressed This Visit       Cardiology Problems   Chronic systolic heart failure (HCC) (Chronic)   NICM (nonischemic cardiomyopathy) (HCC) - Primary   Pulmonary hypertension (HCC)   Other Visit Diagnoses       Essential hypertension         ICD (implantable cardioverter-defibrillator) in place         Chest pain, unspecified type         OSA (obstructive sleep apnea)         CKD (chronic kidney disease), stage II         Poorly controlled diabetes mellitus (HCC)          Nonischemic cardiomyopathy EF 30 to 35% in 2023 Family assists with medications They report medication compliance Appears euvolemic, no changes made to medications  Diabetes type 2 with complications Hemoglobin A1c continues to run high, 9.4 On Mounjaro   Bipolar 1 disorder/PTSD Followed by PMD, stable  Cirrhosis Followed by GI,  Alcohol cessation recommended, weight loss     Signed, Juanda Noon, M.D., Ph.D. [facility] [place], [place] [phone]

## 2023-11-29 ENCOUNTER — Other Ambulatory Visit: Payer: Self-pay | Admitting: Cardiovascular Disease

## 2023-11-29 ENCOUNTER — Telehealth: Payer: Self-pay | Admitting: Pharmacy Technician

## 2023-11-29 ENCOUNTER — Other Ambulatory Visit (HOSPITAL_COMMUNITY): Payer: Self-pay

## 2023-11-29 ENCOUNTER — Ambulatory Visit: Attending: Cardiovascular Disease | Admitting: Cardiovascular Disease

## 2023-11-29 ENCOUNTER — Other Ambulatory Visit: Payer: Self-pay | Admitting: Emergency Medicine

## 2023-11-29 VITALS — BP 112/76 | HR 80 | Ht 65.0 in | Wt 184.4 lb

## 2023-11-29 DIAGNOSIS — I428 Other cardiomyopathies: Secondary | ICD-10-CM

## 2023-11-29 DIAGNOSIS — Z95810 Presence of automatic (implantable) cardiac defibrillator: Secondary | ICD-10-CM

## 2023-11-29 DIAGNOSIS — I272 Pulmonary hypertension, unspecified: Secondary | ICD-10-CM

## 2023-11-29 DIAGNOSIS — R079 Chest pain, unspecified: Secondary | ICD-10-CM

## 2023-11-29 DIAGNOSIS — G4733 Obstructive sleep apnea (adult) (pediatric): Secondary | ICD-10-CM

## 2023-11-29 DIAGNOSIS — I10 Essential (primary) hypertension: Secondary | ICD-10-CM

## 2023-11-29 DIAGNOSIS — I5022 Chronic systolic (congestive) heart failure: Secondary | ICD-10-CM | POA: Diagnosis not present

## 2023-11-29 DIAGNOSIS — E1165 Type 2 diabetes mellitus with hyperglycemia: Secondary | ICD-10-CM

## 2023-11-29 DIAGNOSIS — N182 Chronic kidney disease, stage 2 (mild): Secondary | ICD-10-CM

## 2023-11-29 MED ORDER — POTASSIUM CHLORIDE CRYS ER 20 MEQ PO TBCR: EXTENDED_RELEASE_TABLET | ORAL | 3 refills | Status: DC

## 2023-11-29 MED ORDER — SPIRONOLACTONE 25 MG PO TABS: 25.0000 mg | ORAL_TABLET | Freq: Every day | ORAL | 3 refills | Status: DC

## 2023-11-29 MED ORDER — FUROSEMIDE 20 MG PO TABS: 20.0000 mg | ORAL_TABLET | ORAL | 3 refills | Status: DC

## 2023-11-29 MED ORDER — DIGOXIN 62.5 MCG PO TABS: 0.0625 mg | ORAL_TABLET | Freq: Every day | ORAL | 3 refills | Status: DC

## 2023-11-29 MED ORDER — ROSUVASTATIN CALCIUM 40 MG PO TABS: 40.0000 mg | ORAL_TABLET | Freq: Every day | ORAL | 3 refills | Status: DC

## 2023-11-29 MED ORDER — ENTRESTO 49-51 MG PO TABS: 1.0000 | ORAL_TABLET | Freq: Two times a day (BID) | ORAL | 3 refills | Status: DC

## 2023-11-29 MED ORDER — CARVEDILOL 12.5 MG PO TABS: ORAL_TABLET | ORAL | 3 refills | Status: DC

## 2023-11-29 MED ORDER — DIGOXIN 125 MCG PO TABS: 0.0625 mg | ORAL_TABLET | Freq: Every day | ORAL | 3 refills | Status: DC

## 2023-11-29 NOTE — Telephone Encounter (Signed)
 Requested prescription sent to preferred pharmacy.

## 2023-11-29 NOTE — Patient Instructions (Addendum)
 Call for dizzy spells, low blood pressure  Medication Instructions:  Please reduce digoxin  dose to [id number] mg daily   If you need a refill on your cardiac medications before your next appointment, please call your pharmacy.   Lab work: No new labs needed  Testing/Procedures: No new testing needed  Follow-Up: At [facility], you and your health needs are our priority.  As part of our continuing mission to provide you with exceptional heart care, we have created designated Provider Care Teams.  These Care Teams include your primary Cardiologist (physician) and Advanced Practice Providers (APPs -  Physician Assistants and Nurse Practitioners) who all work together to provide you with the care you need, when you need it.  You will need a follow up appointment in  6 months, APP ok  Providers on your designated Care Team:   Laneta Pintos, NP Varney Gentleman, PA-C Cadence Gennaro Khat, [place]  COVID-19 Vaccine Information can be found at: [email or web address] For questions related to vaccine distribution or appointments, please email [email or web address] or call [phone].

## 2023-11-29 NOTE — Telephone Encounter (Signed)
 Bonnie Ochoa, CPhT     11/29/23 11:28 AM Note Sent note to chge digoxin  62.5 MCG TABS to digoxin  0.125mg  as 1/2 tablet a day -would go through but too soon until 4/28    Pharmacist is requesting a medication change on pt's medication. Please address

## 2023-11-29 NOTE — Telephone Encounter (Signed)
 Sent note to chge digoxin  62.5 MCG TABS to digoxin  0.125mg  as 1/2 tablet a day -would go through but too soon until 4/28

## 2023-12-03 ENCOUNTER — Ambulatory Visit: Admitting: Family Medicine

## 2023-12-03 ENCOUNTER — Ambulatory Visit: Payer: Medicare HMO | Admitting: Cardiovascular Disease

## 2023-12-06 ENCOUNTER — Other Ambulatory Visit: Payer: Self-pay

## 2023-12-06 NOTE — Progress Notes (Signed)
   12/06/2023  Patient ID: Bonnie Ochoa, female   DOB: 09-May-1966, 58 y.o.   MRN: [id number]  Subjective/Objective Telephone visit to follow-up on management of diabetes  Diabetes Management Plan -Current medications:  Mounjaro  10mg  weekly. Lantus  45 units daily, Jardiance  25mg  daily  -Patient states she has had 2 doses of Mounjaro  10mg  and is tolerating well -Using Freestyle Libre 3 for CGM and endorses FBG recently 86-100 -Does not endorse any s/sx of hypoglycemia  Assessment/Plan  Diabetes Management Plan -Continue 2 more weeks of Mounjaro  10mg  weekly, then increase to 12.5mg  weekly -informed patient rx is on file at pharmacy; she will just need to call to request refill -Once Mounjaro  is increased to 12.5mg  weekly, decrease Lantus  to 38 units nightly -Sending MyChart message with this information as a reminder -Sent email invite for patient to link CGM data to [employer]'s LibreView Dashboard  Follow-up:  4 weeks  Linn Rich, PharmD, DPLA

## 2023-12-09 ENCOUNTER — Encounter: Payer: Self-pay | Admitting: *Deleted

## 2023-12-10 ENCOUNTER — Encounter: Payer: Self-pay | Admitting: Internal Medicine

## 2023-12-27 ENCOUNTER — Other Ambulatory Visit: Payer: Self-pay | Admitting: Family Medicine

## 2023-12-27 DIAGNOSIS — F3132 Bipolar disorder, current episode depressed, moderate: Secondary | ICD-10-CM

## 2023-12-30 NOTE — Telephone Encounter (Signed)
 Requested Prescriptions  Pending Prescriptions Disp Refills   mirtazapine  (REMERON ) 7.5 MG tablet [Pharmacy Med Name: MIRTAZAPINE  7.5 MG TABLET] 90 tablet 0    Sig: TAKE 1 TABLET BY MOUTH AT BEDTIME.     Psychiatry: Antidepressants - mirtazapine  Failed - 12/30/2023  2:18 PM      Failed - Valid encounter within last 6 months    Recent Outpatient Visits           1 month ago Type 2 diabetes mellitus with stage 3a chronic kidney disease, with long-term current use of insulin  (HCC)   [facility] [place], Megan P, DO              Passed - Completed PHQ-2 or PHQ-9 in the last 360 days

## 2024-01-02 ENCOUNTER — Other Ambulatory Visit: Payer: Self-pay

## 2024-01-02 NOTE — Progress Notes (Signed)
   01/02/2024  Patient ID: Bonnie Ochoa, female   DOB: 14-Mar-1966, 58 y.o.   MRN: [id number]  Subjective/Objective Telephone visit to follow-up on management of diabetes   Diabetes Management Plan -Current medications:  Mounjaro  12.5mg  weekly. Lantus  38 units daily, Jardiance  25mg  daily  -Patient states she has had 1 dose of Mounjaro  12.5mg  weekly and is tolerating well so far -Using [facility] 3 for CGM and endorses FBG recently 86-100 -Endorses 1 instance where she felt s/sx of hypoglycemia, but her Jerrilyn Moras did not alert that BG was low; so she does not know if it actually was -A1c 9.4% on 4/8 -Patient has not linked Libre data to [employer]   Assessment/Plan   Diabetes Management Plan -Continue 3 more weeks of Mounjaro  12.5mg  weekly- will increase to 15mg  weekly after this if patient continues to tolerate well -If Mounjaro  is increased to 12.5mg  weekly, decrease Lantus  to 30 units nightly -Continue to use [place] for CGM -Sending another email invite for patient to link CGM data to [facility]'s LibreView Dashboard -Due for A1c again 7/8   Follow-up:  2 weeks   Linn Rich, PharmD, DPLA

## 2024-01-08 ENCOUNTER — Ambulatory Visit (INDEPENDENT_AMBULATORY_CARE_PROVIDER_SITE_OTHER): Admitting: Family Medicine

## 2024-01-08 ENCOUNTER — Encounter: Payer: Self-pay | Admitting: Family Medicine

## 2024-01-08 VITALS — BP 118/84 | HR 83 | Temp 98.0°F | Ht 65.0 in | Wt 182.0 lb

## 2024-01-08 DIAGNOSIS — N1831 Chronic kidney disease, stage 3a: Secondary | ICD-10-CM

## 2024-01-08 DIAGNOSIS — Z794 Long term (current) use of insulin: Secondary | ICD-10-CM

## 2024-01-08 DIAGNOSIS — E1122 Type 2 diabetes mellitus with diabetic chronic kidney disease: Secondary | ICD-10-CM | POA: Diagnosis not present

## 2024-01-08 NOTE — Assessment & Plan Note (Signed)
 Has done 1 dose of 12.5mg  mounjaro . Will continue and recheck sugars in 6 weeks. Given AM sugars in the 80s may need to drop her insulin  dose as they normalize.

## 2024-01-08 NOTE — Progress Notes (Unsigned)
 BP 118/84 (BP Location: Left Arm, Patient Position: Sitting, Cuff Size: Large)   Pulse 83   Temp 98 F (36.7 C) (Oral)   Ht 5\' 5"  (1.651 m)   Wt 182 lb (82.6 kg)   SpO2 96%   BMI 30.29 kg/m    Subjective:    Patient ID: Bonnie Ochoa, female    DOB: Mar 03, 1966, 58 y.o.   MRN: [id number]  HPI: Bonnie Ochoa is a 58 y.o. female  Chief Complaint  Patient presents with   Diabetes   DIABETES Hypoglycemic episodes:no Polydipsia/polyuria: no Visual disturbance: no Chest pain: no Paresthesias: no Glucose Monitoring: continuous- but her CGM fell off  Fasting glucose: 80-90s Taking Insulin ?: yes  Long acting insulin : toujeo  38 units Blood Pressure Monitoring: not checking Retinal Examination: Up to Date Foot Exam: Up to Date Diabetic Education: Completed Pneumovax: Up to Date Influenza: Up to Date Aspirin : no  Relevant past medical, surgical, family and social history reviewed and updated as indicated. Interim medical history since our last visit reviewed. Allergies and medications reviewed and updated.  Review of Systems  Constitutional: Negative.   Respiratory: Negative.    Cardiovascular: Negative.   Musculoskeletal: Negative.   Neurological: Negative.   Psychiatric/Behavioral: Negative.      Per HPI unless specifically indicated above     Objective:     BP 118/84 (BP Location: Left Arm, Patient Position: Sitting, Cuff Size: Large)   Pulse 83   Temp 98 F (36.7 C) (Oral)   Ht 5\' 5"  (1.651 m)   Wt 182 lb (82.6 kg)   SpO2 96%   BMI 30.29 kg/m   Wt Readings from Last 3 Encounters:  01/08/24 182 lb (82.6 kg)  11/29/23 184 lb 6.4 oz (83.6 kg)  11/26/23 185 lb (83.9 kg)    Physical Exam Vitals and nursing note reviewed.  Constitutional:      General: She is not in acute distress.    Appearance: Normal appearance. She is not ill-appearing, toxic-appearing or diaphoretic.  HENT:     Head: Normocephalic and atraumatic.     Right Ear: External ear  normal.     Left Ear: External ear normal.     Nose: Nose normal.     Mouth/Throat:     Mouth: Mucous membranes are moist.     Pharynx: Oropharynx is clear.  Eyes:     General: No scleral icterus.       Right eye: No discharge.        Left eye: No discharge.     Extraocular Movements: Extraocular movements intact.     Conjunctiva/sclera: Conjunctivae normal.     Pupils: Pupils are equal, round, and reactive to light.  Cardiovascular:     Rate and Rhythm: Normal rate and regular rhythm.     Pulses: Normal pulses.     Heart sounds: Normal heart sounds. No murmur heard.    No friction rub. No gallop.  Pulmonary:     Effort: Pulmonary effort is normal. No respiratory distress.     Breath sounds: Normal breath sounds. No stridor. No wheezing, rhonchi or rales.  Chest:     Chest wall: No tenderness.  Musculoskeletal:        General: Normal range of motion.     Cervical back: Normal range of motion and neck supple.  Skin:    General: Skin is warm and dry.     Capillary Refill: Capillary refill takes less than 2 seconds.     Coloration: Skin is  not jaundiced or pale.     Findings: No bruising, erythema, lesion or rash.  Neurological:     General: No focal deficit present.     Mental Status: She is alert and oriented to person, place, and time. Mental status is at baseline.  Psychiatric:        Mood and Affect: Mood normal.        Behavior: Behavior normal.        Thought Content: Thought content normal.        Judgment: Judgment normal.     Results for orders placed or performed in visit on 11/19/23  Bayer DCA Hb A1c Waived   Collection Time: 11/19/23  1:31 PM  Result Value Ref Range   HB A1C (BAYER DCA - WAIVED) 9.4 (H) 4.8 - 5.6 %   *Note: Due to a large number of results and/or encounters for the requested time period, some results have not been displayed. A complete set of results can be found in Results Review.      Assessment & Plan:   Problem List Items Addressed  This Visit       Endocrine   DM (diabetes mellitus), type 2 with renal complications (HCC) - Primary   Has done 1 dose of 12.5mg  mounjaro . Will continue and recheck sugars in 6 weeks. Given AM sugars in the 80s may need to drop her insulin  dose as they normalize.       Relevant Orders   Pneumococcal conjugate vaccine 20-valent (Prevnar 20)     Follow up plan: Return in about 6 weeks (around 02/19/2024).

## 2024-01-10 ENCOUNTER — Other Ambulatory Visit: Payer: Self-pay | Admitting: Family Medicine

## 2024-01-11 NOTE — Telephone Encounter (Signed)
 Requested Prescriptions  Pending Prescriptions Disp Refills   insulin  glargine (LANTUS  SOLOSTAR) 100 UNIT/ML Solostar Pen [Pharmacy Med Name: LANTUS  SOLOSTAR 100 UNIT/ML] 15 mL 6    Sig: INJECT 45 UNITS INTO THE SKIN DAILY AT 10 PM.     Endocrinology:  Diabetes - Insulins Failed - 01/11/2024  3:00 PM      Failed - HBA1C is between 0 and 7.9 and within 180 days    Hemoglobin A1C  Date Value Ref Range Status  02/19/2018 7.8  Final   HB A1C (BAYER DCA - WAIVED)  Date Value Ref Range Status  11/19/2023 9.4 (H) 4.8 - 5.6 % Final    Comment:             Prediabetes: 5.7 - 6.4          Diabetes: >6.4          Glycemic control for adults with diabetes: <7.0          Failed - Valid encounter within last 6 months    Recent Outpatient Visits           3 days ago Type 2 diabetes mellitus with stage 3a chronic kidney disease, with long-term current use of insulin  (HCC)   [facility] [place], Megan P, DO   1 month ago Type 2 diabetes mellitus with stage 3a chronic kidney disease, with long-term current use of insulin  [facility])    [facility] [place], [place], DO

## 2024-01-15 ENCOUNTER — Telehealth: Payer: Self-pay | Admitting: Family Medicine

## 2024-01-15 NOTE — Telephone Encounter (Signed)
 Patient dropped off  Handicap Placard, to be filled out by provider. Patient request call within 5-days when ready. Document is located in providers folder. Please advise at Mobile [phone] (mobile)

## 2024-01-15 NOTE — Telephone Encounter (Signed)
 Form filled out. Placed in DJ folder, Awaiting signature.

## 2024-01-16 ENCOUNTER — Other Ambulatory Visit: Payer: Self-pay

## 2024-01-16 MED ORDER — TIRZEPATIDE 12.5 MG/0.5ML ~~LOC~~ SOAJ: 12.5000 mg | SUBCUTANEOUS | 0 refills | Status: DC

## 2024-01-16 NOTE — Progress Notes (Signed)
   01/16/2024  Patient ID: Rochell Chroman, female   DOB: 19-Feb-1966, 58 y.o.   MRN: [id number]  Subjective/Objective Telephone visit to follow-up on management of diabetes   Diabetes Management Plan -Current medications:  Mounjaro  12.5mg  weekly. Lantus  38 units daily, Jardiance  25mg  daily  -Patient states she has had 3 doses of Mounjaro  12.5mg  weekly and is tolerating well  -Using Freestyle Libre 3 for CGM and endorses FBG recently 80-105 -Does not endorse any s/sx of hypoglycemia since our last visit 2 weeks ago -A1c 9.4% on 4/8 -Patient has not linked Libre data to [employer]   Assessment/Plan   Diabetes Management Plan -Continue current regimen and use of Libre for CGM -Patient sees PCP 7/11 and will be due for follow-up A1c  -Based on A1c, I recommend increasing Mounjaro  to 15mg  weekly, continuing Jardiance  25mg , and decreasing Lantus  by approximately 20% (down to 30 units) -Refill pending for Mounjaro  12.5mg  weekly since patient will run out before next visit   Follow-up:  2 months   Linn Rich, PharmD, DPLA

## 2024-01-22 ENCOUNTER — Other Ambulatory Visit: Payer: Self-pay | Admitting: Family Medicine

## 2024-01-22 DIAGNOSIS — F3132 Bipolar disorder, current episode depressed, moderate: Secondary | ICD-10-CM

## 2024-01-23 NOTE — Telephone Encounter (Signed)
 OV 01/08/24 Requested Prescriptions  Pending Prescriptions Disp Refills   lamoTRIgine  (LAMICTAL ) 25 MG tablet [Pharmacy Med Name: LAMOTRIGINE  25 MG TABLET] 90 tablet 2    Sig: TAKE 1 TABLET (25 MG TOTAL) BY MOUTH DAILY.     Neurology:  Anticonvulsants - lamotrigine  Failed - 01/23/2024 12:57 PM      Failed - Cr in normal range and within 360 days    Creatinine, Ser  Date Value Ref Range Status  11/14/2023 1.26 (H) 0.57 - 1.00 mg/dL Final   Creatinine, Urine  Date Value Ref Range Status  07/02/2017 128 mg/dL Final         Failed - Valid encounter within last 12 months    Recent Outpatient Visits           2 weeks ago Type 2 diabetes mellitus with stage 3a chronic kidney disease, with long-term current use of insulin  (HCC)   [place] [facility] [place], Megan P, DO   2 months ago Type 2 diabetes mellitus with stage 3a chronic kidney disease, with long-term current use of insulin  (HCC)   [place] [facility] [place], Megan P, DO              Passed - ALT in normal range and within 360 days    ALT  Date Value Ref Range Status  08/13/2023 19 0 - 32 IU/L Final         Passed - AST in normal range and within 360 days    AST  Date Value Ref Range Status  08/13/2023 17 0 - 40 IU/L Final         Passed - Completed PHQ-2 or PHQ-9 in the last 360 days       pantoprazole  (PROTONIX ) 40 MG tablet [Pharmacy Med Name: PANTOPRAZOLE  SOD DR 40 MG TAB] 90 tablet 1    Sig: TAKE 1 TABLET BY MOUTH EVERY DAY     Gastroenterology: Proton Pump Inhibitors Failed - 01/23/2024 12:57 PM      Failed - Valid encounter within last 12 months    Recent Outpatient Visits           2 weeks ago Type 2 diabetes mellitus with stage 3a chronic kidney disease, with long-term current use of insulin  (HCC)   [place] [facility] [place], Megan P, DO   2 months ago Type 2 diabetes mellitus with stage 3a chronic kidney disease, with long-term current use of  insulin  [facility])   [place] [facility] [place], [place], DO

## 2024-02-05 ENCOUNTER — Other Ambulatory Visit: Payer: Self-pay | Admitting: Family Medicine

## 2024-02-06 NOTE — Telephone Encounter (Signed)
 Requested Prescriptions  Pending Prescriptions Disp Refills   gabapentin  (NEURONTIN ) 100 MG capsule [Pharmacy Med Name: GABAPENTIN  100 MG CAPSULE] 90 capsule 1    Sig: TAKE 1 CAPSULE BY MOUTH AT BEDTIME.     Neurology: Anticonvulsants - gabapentin  Failed - 02/06/2024  2:02 PM      Failed - Cr in normal range and within 360 days    Creatinine, Ser  Date Value Ref Range Status  11/14/2023 1.26 (H) 0.57 - 1.00 mg/dL Final   Creatinine, Urine  Date Value Ref Range Status  07/02/2017 128 mg/dL Final         Failed - Valid encounter within last 12 months    Recent Outpatient Visits           4 weeks ago Type 2 diabetes mellitus with stage 3a chronic kidney disease, with long-term current use of insulin  (HCC)   [place] [facility] [place], Megan P, DO   2 months ago Type 2 diabetes mellitus with stage 3a chronic kidney disease, with long-term current use of insulin  (HCC)   [place] [facility] [place], Megan P, DO              Passed - Completed PHQ-2 or PHQ-9 in the last 360 days

## 2024-02-19 ENCOUNTER — Other Ambulatory Visit: Payer: Self-pay | Admitting: Family Medicine

## 2024-02-20 NOTE — Telephone Encounter (Signed)
 Requested Prescriptions  Pending Prescriptions Disp Refills   empagliflozin  (JARDIANCE ) 25 MG TABS tablet [Pharmacy Med Name: JARDIANCE  25 MG TABLET] 90 tablet 1    Sig: TAKE 1 TABLET (25 MG TOTAL) BY MOUTH DAILY.     Endocrinology:  Diabetes - SGLT2 Inhibitors Failed - 02/20/2024  5:55 PM      Failed - Cr in normal range and within 360 days    Creatinine, Ser  Date Value Ref Range Status  11/14/2023 1.26 (H) 0.57 - 1.00 mg/dL Final   Creatinine, Urine  Date Value Ref Range Status  07/02/2017 128 mg/dL Final         Failed - HBA1C is between 0 and 7.9 and within 180 days    Hemoglobin A1C  Date Value Ref Range Status  02/19/2018 7.8  Final   HB A1C (BAYER DCA - WAIVED)  Date Value Ref Range Status  11/19/2023 9.4 (H) 4.8 - 5.6 % Final    Comment:             Prediabetes: 5.7 - 6.4          Diabetes: >6.4          Glycemic control for adults with diabetes: <7.0          Failed - eGFR in normal range and within 360 days    GFR calc Af Amer  Date Value Ref Range Status  07/11/2020 60 >59 mL/min/1.73 Final    Comment:    **In accordance with recommendations from the NKF-ASN Task force,**   [facility] is in the process of updating its eGFR calculation to the   2021 CKD-EPI creatinine equation that estimates kidney function   without a race variable.    GFR, Estimated  Date Value Ref Range Status  01/04/2023 32 (L) >60 mL/min Final    Comment:    (NOTE) Calculated using the CKD-EPI Creatinine Equation (2021)    eGFR  Date Value Ref Range Status  11/14/2023 49 (L) >59 mL/min/1.73 Final         Passed - Valid encounter within last 6 months    Recent Outpatient Visits           1 month ago Type 2 diabetes mellitus with stage 3a chronic kidney disease, with long-term current use of insulin  (HCC)   [place] [facility] [place], Megan P, DO   3 months ago Type 2 diabetes mellitus with stage 3a chronic kidney disease, with long-term current use of insulin   [place] [facility])   [place] [facility] [place], [place], DO

## 2024-02-21 ENCOUNTER — Other Ambulatory Visit: Payer: Self-pay | Admitting: Family Medicine

## 2024-02-21 ENCOUNTER — Ambulatory Visit: Admitting: Family Medicine

## 2024-02-24 NOTE — Telephone Encounter (Signed)
 tirzepatide  (MOUNJARO ) 12.5 MG/0.5ML Pen 2 mL 0 01/16/2024 --  Sig:   Inject 12.5 mg into the skin once a week.    Route:   Subcutaneous     Requested Prescriptions  Pending Prescriptions Disp Refills   MOUNJARO  10 MG/0.5ML Pen [Pharmacy Med Name: MOUNJARO  10 MG/0.5 ML PEN]      Sig: INJECT 10 MG INTO THE SKIN ONE TIME PER WEEK     Off-Protocol Failed - 02/24/2024  2:26 PM      Failed - Medication not assigned to a protocol, review manually.      Passed - Valid encounter within last 12 months    Recent Outpatient Visits           1 month ago Type 2 diabetes mellitus with stage 3a chronic kidney disease, with long-term current use of insulin  (HCC)   [place] [facility] [place], Megan P, DO   3 months ago Type 2 diabetes mellitus with stage 3a chronic kidney disease, with long-term current use of insulin  [facility])    [facility] [place], [place], DO

## 2024-03-06 ENCOUNTER — Other Ambulatory Visit: Payer: Self-pay | Admitting: Family Medicine

## 2024-03-06 DIAGNOSIS — F3132 Bipolar disorder, current episode depressed, moderate: Secondary | ICD-10-CM

## 2024-03-09 NOTE — Telephone Encounter (Signed)
 Requested Prescriptions  Pending Prescriptions Disp Refills   baclofen  (LIORESAL ) 10 MG tablet [Pharmacy Med Name: BACLOFEN  10 MG TABLET] 30 tablet 2    Sig: TAKE 1/2 TO 1 TABLET (5-10 MG TOTAL) BY MOUTH EVERY DAY AT BEDTIME AS NEEDED FOR MUSCLE SPASM     Analgesics:  Muscle Relaxants - baclofen  Failed - 03/09/2024 11:49 AM      Failed - Cr in normal range and within 180 days    Creatinine, Ser  Date Value Ref Range Status  11/14/2023 1.26 (H) 0.57 - 1.00 mg/dL Final   Creatinine, Urine  Date Value Ref Range Status  07/02/2017 128 mg/dL Final         Passed - eGFR is 30 or above and within 180 days    GFR calc Af Amer  Date Value Ref Range Status  07/11/2020 60 >59 mL/min/1.73 Final    Comment:    **In accordance with recommendations from the NKF-ASN Task force,**   [facility] is in the process of updating its eGFR calculation to the   2021 CKD-EPI creatinine equation that estimates kidney function   without a race variable.    GFR, Estimated  Date Value Ref Range Status  01/04/2023 32 (L) >60 mL/min Final    Comment:    (NOTE) Calculated using the CKD-EPI Creatinine Equation (2021)    eGFR  Date Value Ref Range Status  11/14/2023 49 (L) >59 mL/min/1.73 Final         Passed - Valid encounter within last 6 months    Recent Outpatient Visits           2 months ago Type 2 diabetes mellitus with stage 3a chronic kidney disease, with long-term current use of insulin  (HCC)   [place] [facility] [place], Megan P, DO   3 months ago Type 2 diabetes mellitus with stage 3a chronic kidney disease, with long-term current use of insulin  (HCC)   [place] [facility] [place], Megan P, DO               albuterol  (VENTOLIN  HFA) 108 (90 Base) MCG/ACT inhaler [Pharmacy Med Name: ALBUTEROL  HFA (PROAIR ) INHALER] 8.5 each 2    Sig: TAKE 2 PUFFS BY MOUTH EVERY 6 HOURS AS NEEDED FOR WHEEZE OR SHORTNESS OF BREATH     Pulmonology:  Beta Agonists 2 Passed -  03/09/2024 11:49 AM      Passed - Last BP in normal range    BP Readings from Last 1 Encounters:  01/08/24 118/84         Passed - Last Heart Rate in normal range    Pulse Readings from Last 1 Encounters:  01/08/24 83         Passed - Valid encounter within last 12 months    Recent Outpatient Visits           2 months ago Type 2 diabetes mellitus with stage 3a chronic kidney disease, with long-term current use of insulin  (HCC)   [place] [facility] [place], Megan P, DO   3 months ago Type 2 diabetes mellitus with stage 3a chronic kidney disease, with long-term current use of insulin  (HCC)   [place] [facility] [place], Megan P, DO               MOUNJARO  12.5 MG/0.5ML Pen [Pharmacy Med Name: MOUNJARO  12.5 MG/0.5 ML PEN]      Sig: INJECT 12.5 MG SUBCUTANEOUSLY ONE TIME PER WEEK  Off-Protocol Failed - 03/09/2024 11:49 AM      Failed - Medication not assigned to a protocol, review manually.      Passed - Valid encounter within last 12 months    Recent Outpatient Visits           2 months ago Type 2 diabetes mellitus with stage 3a chronic kidney disease, with long-term current use of insulin  (HCC)   [place] [facility] [place], Megan P, DO   3 months ago Type 2 diabetes mellitus with stage 3a chronic kidney disease, with long-term current use of insulin  (HCC)   [place] [place], Megan P, DO               cyclobenzaprine  (FLEXERIL ) 5 MG tablet [Pharmacy Med Name: CYCLOBENZAPRINE  5 MG TABLET] 60 tablet 1    Sig: TAKE 1 TABLET BY MOUTH THREE TIMES A DAY AS NEEDED FOR MUSCLE SPASM     Not Delegated - Analgesics:  Muscle Relaxants Failed - 03/09/2024 11:49 AM      Failed - This refill cannot be delegated      Passed - Valid encounter within last 6 months    Recent Outpatient Visits           2 months ago Type 2 diabetes mellitus with stage 3a chronic kidney disease, with long-term current  use of insulin  (HCC)   [place] [facility] [place], Megan P, DO   3 months ago Type 2 diabetes mellitus with stage 3a chronic kidney disease, with long-term current use of insulin  (HCC)   [place] [facility] [place], Megan P, DO               mirtazapine  (REMERON ) 7.5 MG tablet [Pharmacy Med Name: MIRTAZAPINE  7.5 MG TABLET] 90 tablet 0    Sig: TAKE 1 TABLET BY MOUTH EVERYDAY AT BEDTIME     Psychiatry: Antidepressants - mirtazapine  Passed - 03/09/2024 11:49 AM      Passed - Completed PHQ-2 or PHQ-9 in the last 360 days      Passed - Valid encounter within last 6 months    Recent Outpatient Visits           2 months ago Type 2 diabetes mellitus with stage 3a chronic kidney disease, with long-term current use of insulin  (HCC)   [place] [facility] [place], Megan P, DO   3 months ago Type 2 diabetes mellitus with stage 3a chronic kidney disease, with long-term current use of insulin  [facility])   [place] [facility] [place], [place], DO

## 2024-03-09 NOTE — Telephone Encounter (Signed)
 Requested medications are due for refill today.  yes  Requested medications are on the active medications list.  yes  Last refill. Cyclobenzaprine  06/14/2023 #60 1 rf, Mounjaro  01/16/2024 2mL  Future visit scheduled.   yes  Notes to clinic.  Cyclobenzaprine  is not delegated. Mounjaro  does not have a protocol.    Requested Prescriptions  Pending Prescriptions Disp Refills   MOUNJARO  12.5 MG/0.5ML Pen [Pharmacy Med Name: MOUNJARO  12.5 MG/0.5 ML PEN]      Sig: INJECT 12.5 MG SUBCUTANEOUSLY ONE TIME PER WEEK     Off-Protocol Failed - 03/09/2024 11:49 AM      Failed - Medication not assigned to a protocol, review manually.      Passed - Valid encounter within last 12 months    Recent Outpatient Visits           2 months ago Type 2 diabetes mellitus with stage 3a chronic kidney disease, with long-term current use of insulin  (HCC)   [place] [facility] [place], Megan P, DO   3 months ago Type 2 diabetes mellitus with stage 3a chronic kidney disease, with long-term current use of insulin  (HCC)   [place] [facility] [place], Megan P, DO               cyclobenzaprine  (FLEXERIL ) 5 MG tablet [Pharmacy Med Name: CYCLOBENZAPRINE  5 MG TABLET] 60 tablet 1    Sig: TAKE 1 TABLET BY MOUTH THREE TIMES A DAY AS NEEDED FOR MUSCLE SPASM     Not Delegated - Analgesics:  Muscle Relaxants Failed - 03/09/2024 11:49 AM      Failed - This refill cannot be delegated      Passed - Valid encounter within last 6 months    Recent Outpatient Visits           2 months ago Type 2 diabetes mellitus with stage 3a chronic kidney disease, with long-term current use of insulin  (HCC)   [place] [facility] [place], Megan P, DO   3 months ago Type 2 diabetes mellitus with stage 3a chronic kidney disease, with long-term current use of insulin  (HCC)   [place] [facility] [place], Megan P, DO              Signed Prescriptions Disp Refills    baclofen  (LIORESAL ) 10 MG tablet 30 tablet 2    Sig: TAKE 1/2 TO 1 TABLET (5-10 MG TOTAL) BY MOUTH EVERY DAY AT BEDTIME AS NEEDED FOR MUSCLE SPASM     Analgesics:  Muscle Relaxants - baclofen  Failed - 03/09/2024 11:49 AM      Failed - Cr in normal range and within 180 days    Creatinine, Ser  Date Value Ref Range Status  11/14/2023 1.26 (H) 0.57 - 1.00 mg/dL Final   Creatinine, Urine  Date Value Ref Range Status  07/02/2017 128 mg/dL Final         Passed - eGFR is 30 or above and within 180 days    GFR calc Af Amer  Date Value Ref Range Status  07/11/2020 60 >59 mL/min/1.73 Final    Comment:    **In accordance with recommendations from the NKF-ASN Task force,**   [facility] is in the process of updating its eGFR calculation to the   2021 CKD-EPI creatinine equation that estimates kidney function   without a race variable.    GFR, Estimated  Date Value Ref Range Status  01/04/2023 32 (L) >60 mL/min Final    Comment:    (  NOTE) Calculated using the CKD-EPI Creatinine Equation (2021)    eGFR  Date Value Ref Range Status  11/14/2023 49 (L) >59 mL/min/1.73 Final         Passed - Valid encounter within last 6 months    Recent Outpatient Visits           2 months ago Type 2 diabetes mellitus with stage 3a chronic kidney disease, with long-term current use of insulin  (HCC)   [place] [facility] [place], Megan P, DO   3 months ago Type 2 diabetes mellitus with stage 3a chronic kidney disease, with long-term current use of insulin  (HCC)   [place] [facility] [place], Megan P, DO               albuterol  (VENTOLIN  HFA) 108 (90 Base) MCG/ACT inhaler 8.5 each 2    Sig: TAKE 2 PUFFS BY MOUTH EVERY 6 HOURS AS NEEDED FOR WHEEZE OR SHORTNESS OF BREATH     Pulmonology:  Beta Agonists 2 Passed - 03/09/2024 11:49 AM      Passed - Last BP in normal range    BP Readings from Last 1 Encounters:  01/08/24 118/84         Passed - Last Heart Rate in  normal range    Pulse Readings from Last 1 Encounters:  01/08/24 83         Passed - Valid encounter within last 12 months    Recent Outpatient Visits           2 months ago Type 2 diabetes mellitus with stage 3a chronic kidney disease, with long-term current use of insulin  (HCC)   [place] [facility] [place], Megan P, DO   3 months ago Type 2 diabetes mellitus with stage 3a chronic kidney disease, with long-term current use of insulin  (HCC)   [place] [facility] [place], Megan P, DO               mirtazapine  (REMERON ) 7.5 MG tablet 90 tablet 0    Sig: TAKE 1 TABLET BY MOUTH EVERYDAY AT BEDTIME     Psychiatry: Antidepressants - mirtazapine  Passed - 03/09/2024 11:49 AM      Passed - Completed PHQ-2 or PHQ-9 in the last 360 days      Passed - Valid encounter within last 6 months    Recent Outpatient Visits           2 months ago Type 2 diabetes mellitus with stage 3a chronic kidney disease, with long-term current use of insulin  (HCC)   [place] [facility] [place], Megan P, DO   3 months ago Type 2 diabetes mellitus with stage 3a chronic kidney disease, with long-term current use of insulin  [facility])   [place] [facility] [place], [place], DO

## 2024-03-10 NOTE — Telephone Encounter (Signed)
 Does she have enough to make it to appt later this week? We may need to adjust medicine and I don't want to call it in if she does

## 2024-03-12 ENCOUNTER — Ambulatory Visit (INDEPENDENT_AMBULATORY_CARE_PROVIDER_SITE_OTHER): Admitting: Family Medicine

## 2024-03-12 ENCOUNTER — Encounter: Payer: Self-pay | Admitting: Family Medicine

## 2024-03-12 VITALS — BP 148/94 | HR 79 | Ht 65.0 in | Wt 182.0 lb

## 2024-03-12 DIAGNOSIS — Z23 Encounter for immunization: Secondary | ICD-10-CM | POA: Diagnosis not present

## 2024-03-12 DIAGNOSIS — E1122 Type 2 diabetes mellitus with diabetic chronic kidney disease: Secondary | ICD-10-CM | POA: Diagnosis not present

## 2024-03-12 DIAGNOSIS — R300 Dysuria: Secondary | ICD-10-CM

## 2024-03-12 DIAGNOSIS — Z794 Long term (current) use of insulin: Secondary | ICD-10-CM | POA: Diagnosis not present

## 2024-03-12 DIAGNOSIS — I129 Hypertensive chronic kidney disease with stage 1 through stage 4 chronic kidney disease, or unspecified chronic kidney disease: Secondary | ICD-10-CM

## 2024-03-12 DIAGNOSIS — F3132 Bipolar disorder, current episode depressed, moderate: Secondary | ICD-10-CM | POA: Diagnosis not present

## 2024-03-12 DIAGNOSIS — N1831 Chronic kidney disease, stage 3a: Secondary | ICD-10-CM

## 2024-03-12 DIAGNOSIS — E039 Hypothyroidism, unspecified: Secondary | ICD-10-CM | POA: Diagnosis not present

## 2024-03-12 LAB — URINALYSIS, ROUTINE W REFLEX MICROSCOPIC
Bilirubin, UA: NEGATIVE
Ketones, UA: NEGATIVE
Leukocytes,UA: NEGATIVE
Nitrite, UA: NEGATIVE
Specific Gravity, UA: 1.02 (ref 1.005–1.030)
Urobilinogen, Ur: 1 mg/dL (ref 0.2–1.0)
pH, UA: 5.5 (ref 5.0–7.5)

## 2024-03-12 LAB — MICROSCOPIC EXAMINATION: Bacteria, UA: NONE SEEN

## 2024-03-12 LAB — BAYER DCA HB A1C WAIVED: HB A1C (BAYER DCA - WAIVED): 6 % — ABNORMAL HIGH (ref 4.8–5.6)

## 2024-03-12 MED ORDER — TIRZEPATIDE 7.5 MG/0.5ML ~~LOC~~ SOAJ
7.5000 mg | SUBCUTANEOUS | 0 refills | Status: DC
Start: 2024-05-07 — End: 2024-05-02

## 2024-03-12 MED ORDER — TIRZEPATIDE 5 MG/0.5ML ~~LOC~~ SOAJ
5.0000 mg | SUBCUTANEOUS | 0 refills | Status: DC
Start: 2024-04-09 — End: 2024-05-02

## 2024-03-12 MED ORDER — TIRZEPATIDE 2.5 MG/0.5ML ~~LOC~~ SOAJ
2.5000 mg | SUBCUTANEOUS | Status: DC
Start: 2024-03-12 — End: 2024-05-02

## 2024-03-12 MED ORDER — TIRZEPATIDE 10 MG/0.5ML ~~LOC~~ SOAJ: 10.0000 mg | SUBCUTANEOUS | 0 refills | Status: DC

## 2024-03-12 MED ORDER — TIRZEPATIDE 12.5 MG/0.5ML ~~LOC~~ SOAJ
12.5000 mg | SUBCUTANEOUS | 0 refills | Status: DC
Start: 2024-07-02 — End: 2024-05-02

## 2024-03-12 MED ORDER — TIRZEPATIDE 15 MG/0.5ML ~~LOC~~ SOAJ
15.0000 mg | SUBCUTANEOUS | 1 refills | Status: DC
Start: 2024-07-30 — End: 2024-05-02

## 2024-03-12 NOTE — Assessment & Plan Note (Signed)
 Doing great with A1c of 6.0- has been off her mounjaro  for about 6 weeks. Will need to titrate back up. Sample of 2.5mg  given today. Titration up to 15mg  sent to her pharmacy. Pharmacy team is involved. Plan on seeing her about every 6 weeks to check on tolerance and start cutting down on insulin  as needed. Continue close monitoring.

## 2024-03-12 NOTE — Assessment & Plan Note (Signed)
 Will get her into psychiatry. New referral placed today. Await their input.

## 2024-03-12 NOTE — Progress Notes (Signed)
 BP (!) 148/94   Pulse 79   Ht 5' 5 (1.651 m)   Wt 182 lb (82.6 kg)   SpO2 98%   BMI 30.29 kg/m    Subjective:    Patient ID: Bonnie Ochoa, female    DOB: Apr 19, 1966, 58 y.o.   MRN: [id number]  HPI: Bonnie Ochoa is a 58 y.o. female  Chief Complaint  Patient presents with   Diabetes   DIABETES- has been off the mounjaro  for 6 weeks. Her sugar is back up and down Hypoglycemic episodes:no Polydipsia/polyuria: yes Visual disturbance: no Chest pain: no Paresthesias: no Glucose Monitoring: yes  Accucheck frequency: continuous Taking Insulin ?: yes  Long acting insulin : 38 units Blood Pressure Monitoring: not checking Retinal Examination: Up to Date Foot Exam: Up to Date Diabetic Education: Completed Pneumovax: Up to Date Influenza: Up to Date Aspirin : no  HYPERTENSION / HYPERLIPIDEMIA Satisfied with current treatment? yes Duration of hypertension: chronic BP monitoring frequency: not checking BP medication side effects: no Past BP meds: entresto , carvedilol , lasix , spironolactone  Duration of hyperlipidemia: chronic Cholesterol medication side effects: no Cholesterol supplements: none Past cholesterol medications: crestor  Medication compliance: excellent compliance Aspirin : no Recent stressors: no Recurrent headaches: no Visual changes: no Palpitations: no Dyspnea: no Chest pain: no Lower extremity edema: no Dizzy/lightheaded: no  BIPOLAR- would like to see psychiatry. Mood has been stable Mood status: stable Satisfied with current treatment?: yes Symptom severity: moderate  Duration of current treatment : chronic Side effects: no Medication compliance: excellent compliance Psychotherapy/counseling: yes in the past Previous psychiatric medications: remeron , lamictal  Depressed mood: no Anxious mood: no Anhedonia: no Significant weight loss or gain: no Insomnia: no  Fatigue: no Feelings of worthlessness or guilt: no Impaired  concentration/indecisiveness: no Suicidal ideations: no Hopelessness: no Crying spells: no    03/12/2024    9:29 AM 11/26/2023    9:00 AM 11/19/2023    1:28 PM 08/13/2023   10:57 AM 05/16/2023   10:04 AM  Depression screen PHQ 2/9  Decreased Interest 2 1 0 2 1  Down, Depressed, Hopeless 1 1 1 2 1   PHQ - 2 Score 3 2 1 4 2   Altered sleeping 0 1 1 0 0  Tired, decreased energy 1 0 0 0 2  Change in appetite 0 1 1 0 0  Feeling bad or failure about yourself  1 1 1 2 1   Trouble concentrating 1 1 1  0 1  Moving slowly or fidgety/restless 0 0 0 0 0  Suicidal thoughts 0 0 0 0 0  PHQ-9 Score 6 6 5 6 6   Difficult doing work/chores Not difficult at all Somewhat difficult  Not difficult at all    HYPOTHYROIDISM Thyroid  control status:controlled Satisfied with current treatment? yes Medication side effects: no Fatigue: no Cold intolerance: no Heat intolerance: no Weight gain: no Weight loss: no Constipation: no Diarrhea/loose stools: no Palpitations: no Lower extremity edema: no Anxiety/depressed mood: yes    Relevant past medical, surgical, family and social history reviewed and updated as indicated. Interim medical history since our last visit reviewed. Allergies and medications reviewed and updated.  Review of Systems  Constitutional: Negative.   Respiratory: Negative.    Cardiovascular: Negative.   Gastrointestinal: Negative.   Genitourinary:  Positive for dysuria and frequency. Negative for decreased urine volume, difficulty urinating, dyspareunia, enuresis, flank pain, genital sores, hematuria, menstrual problem, pelvic pain, urgency, vaginal bleeding, vaginal discharge and vaginal pain.  Musculoskeletal: Negative.   Neurological: Negative.   Psychiatric/Behavioral: Negative.  Per HPI unless specifically indicated above     Objective:    BP (!) 148/94   Pulse 79   Ht 5' 5 (1.651 m)   Wt 182 lb (82.6 kg)   SpO2 98%   BMI 30.29 kg/m   Wt Readings from Last 3  Encounters:  03/12/24 182 lb (82.6 kg)  01/08/24 182 lb (82.6 kg)  11/29/23 184 lb 6.4 oz (83.6 kg)    Physical Exam Vitals and nursing note reviewed.  Constitutional:      General: She is not in acute distress.    Appearance: Normal appearance. She is obese. She is not ill-appearing, toxic-appearing or diaphoretic.  HENT:     Head: Normocephalic and atraumatic.     Right Ear: External ear normal.     Left Ear: External ear normal.     Nose: Nose normal.     Mouth/Throat:     Mouth: Mucous membranes are moist.     Pharynx: Oropharynx is clear.  Eyes:     General: No scleral icterus.       Right eye: No discharge.        Left eye: No discharge.     Extraocular Movements: Extraocular movements intact.     Conjunctiva/sclera: Conjunctivae normal.     Pupils: Pupils are equal, round, and reactive to light.  Cardiovascular:     Rate and Rhythm: Normal rate and regular rhythm.     Pulses: Normal pulses.     Heart sounds: Normal heart sounds. No murmur heard.    No friction rub. No gallop.  Pulmonary:     Effort: Pulmonary effort is normal. No respiratory distress.     Breath sounds: Normal breath sounds. No stridor. No wheezing, rhonchi or rales.  Chest:     Chest wall: No tenderness.  Musculoskeletal:        General: Normal range of motion.     Cervical back: Normal range of motion and neck supple.  Skin:    General: Skin is warm and dry.     Capillary Refill: Capillary refill takes less than 2 seconds.     Coloration: Skin is not jaundiced or pale.     Findings: No bruising, erythema, lesion or rash.  Neurological:     General: No focal deficit present.     Mental Status: She is alert and oriented to person, place, and time. Mental status is at baseline.  Psychiatric:        Mood and Affect: Mood normal.        Behavior: Behavior normal.        Thought Content: Thought content normal.        Judgment: Judgment normal.     Results for orders placed or performed in  visit on 11/19/23  Bayer DCA Hb A1c Waived   Collection Time: 11/19/23  1:31 PM  Result Value Ref Range   HB A1C (BAYER DCA - WAIVED) 9.4 (H) 4.8 - 5.6 %   *Note: Due to a large number of results and/or encounters for the requested time period, some results have not been displayed. A complete set of results can be found in Results Review.      Assessment & Plan:   Problem List Items Addressed This Visit       Endocrine   DM (diabetes mellitus), type 2 with renal complications (HCC) - Primary   Doing great with A1c of 6.0- has been off her mounjaro  for about 6 weeks. Will need to  titrate back up. Sample of 2.5mg  given today. Titration up to 15mg  sent to her pharmacy. Pharmacy team is involved. Plan on seeing her about every 6 weeks to check on tolerance and start cutting down on insulin  as needed. Continue close monitoring.       Relevant Medications   tirzepatide  (MOUNJARO ) 5 MG/0.5ML Pen (Start on 04/09/2024)   tirzepatide  (MOUNJARO ) 7.5 MG/0.5ML Pen (Start on 05/07/2024)   tirzepatide  (MOUNJARO ) 10 MG/0.5ML Pen (Start on 06/04/2024)   tirzepatide  (MOUNJARO ) 12.5 MG/0.5ML Pen (Start on 07/02/2024)   tirzepatide  (MOUNJARO ) 15 MG/0.5ML Pen (Start on 07/30/2024)   tirzepatide  (MOUNJARO ) 2.5 MG/0.5ML Pen   Other Relevant Orders   Bayer DCA Hb A1c Waived   CBC with Differential/Platelet   Comprehensive metabolic panel with GFR   Lipid Panel w/o Chol/HDL Ratio   Hypothyroid   Rechecking labs today. Await results. Treat as needed.       Relevant Orders   TSH     Genitourinary   Benign hypertensive renal disease   Running high- likely will come down when she's back on mounjaro . Hold on adjusting her medicine and recheck in 6 weeks at follow up.        Other   Bipolar disorder, current episode depressed, moderate (HCC)   Will get her into psychiatry. New referral placed today. Await their input.       Relevant Orders   Ambulatory referral to Psychiatry   Other Visit  Diagnoses       Dysuria       3+ glucose. Otherwise normal- will get sugars under better control. Call with any concerns.   Relevant Orders   Urinalysis, Routine w reflex microscopic     Need for vaccination for pneumococcus       Prevnar given today.   Relevant Orders   Pneumococcal conjugate vaccine 20-valent (Prevnar 20) (Completed)        Follow up plan: Return in about 6 weeks (around 04/23/2024).

## 2024-03-12 NOTE — Assessment & Plan Note (Signed)
 Running high- likely will come down when she's back on mounjaro . Hold on adjusting her medicine and recheck in 6 weeks at follow up.

## 2024-03-12 NOTE — Assessment & Plan Note (Signed)
 Rechecking labs today. Await results. Treat as needed.

## 2024-03-13 LAB — LIPID PANEL W/O CHOL/HDL RATIO

## 2024-03-14 LAB — LIPID PANEL W/O CHOL/HDL RATIO
Cholesterol, Total: 131 mg/dL (ref 100–199)
HDL: 36 mg/dL — ABNORMAL LOW (ref >39)
LDL Chol Calc (NIH): 64 mg/dL (ref 0–99)
Triglycerides: 184 mg/dL — ABNORMAL HIGH (ref 0–149)
VLDL Cholesterol Cal: 31 mg/dL (ref 5–40)

## 2024-03-14 LAB — CBC WITH DIFFERENTIAL/PLATELET
Basophils Absolute: 0 x10E3/uL (ref 0.0–0.2)
Basos: 1 %
EOS (ABSOLUTE): 0.3 x10E3/uL (ref 0.0–0.4)
Eos: 5 %
Hematocrit: 43.9 % (ref 34.0–46.6)
Hemoglobin: 14 g/dL (ref 11.1–15.9)
Immature Grans (Abs): 0 x10E3/uL (ref 0.0–0.1)
Immature Granulocytes: 0 %
Lymphocytes Absolute: 2.3 x10E3/uL (ref 0.7–3.1)
Lymphs: 37 %
MCH: 30.2 pg (ref 26.6–33.0)
MCHC: 31.9 g/dL (ref 31.5–35.7)
MCV: 95 fL (ref 79–97)
Monocytes Absolute: 0.6 x10E3/uL (ref 0.1–0.9)
Monocytes: 9 %
Neutrophils Absolute: 3 x10E3/uL (ref 1.4–7.0)
Neutrophils: 48 %
Platelets: 214 x10E3/uL (ref 150–450)
RBC: 4.64 x10E6/uL (ref 3.77–5.28)
RDW: 13.3 % (ref 11.7–15.4)
WBC: 6.3 x10E3/uL (ref 3.4–10.8)

## 2024-03-14 LAB — COMPREHENSIVE METABOLIC PANEL WITH GFR
ALT: 13 IU/L (ref 0–32)
AST: 14 IU/L (ref 0–40)
Albumin: 4.2 g/dL (ref 3.8–4.9)
Alkaline Phosphatase: 93 IU/L (ref 44–121)
BUN/Creatinine Ratio: 12 (ref 12–28)
BUN: 13 mg/dL (ref 8–27)
Bilirubin Total: 0.5 mg/dL (ref 0.0–1.2)
CO2: 20 mmol/L (ref 20–29)
Calcium: 9.6 mg/dL (ref 8.7–10.3)
Chloride: 105 mmol/L (ref 96–106)
Creatinine, Ser: 1.05 mg/dL — ABNORMAL HIGH (ref 0.57–1.00)
Globulin, Total: 3.5 g/dL (ref 1.5–4.5)
Glucose: 141 mg/dL — ABNORMAL HIGH (ref 70–99)
Potassium: 4.2 mmol/L (ref 3.5–5.2)
Sodium: 141 mmol/L (ref 134–144)
Total Protein: 7.7 g/dL (ref 6.0–8.5)
eGFR: 61 mL/min/1.73 (ref >59)

## 2024-03-14 LAB — TSH: TSH: 1.2 u[IU]/mL (ref 0.450–4.500)

## 2024-03-16 ENCOUNTER — Ambulatory Visit: Payer: Self-pay | Admitting: Family Medicine

## 2024-03-17 ENCOUNTER — Other Ambulatory Visit: Payer: Self-pay

## 2024-03-17 NOTE — Progress Notes (Signed)
   03/17/2024  Patient ID: Bonnie Ochoa, female   DOB: 07/21/1966, 58 y.o.   MRN: [id number]  Subjective/Objective Telephone visit to follow-up on management of diabetes   Diabetes Management Plan -Current medications:  Mounjaro  2.5mg  weekly. Lantus  38 units daily, Jardiance  25mg  daily  -Patient had titrated up to 12.5mg  Mounjaro  and was tolerating well but went without medication for 6 weeks due to inability to fill at pharmacy (unclear as to why) -Was provided with Mounjaro  2.5mg  sample at last OV and had 1st dose last Thursday -Has also picked up 5 mg Mounjaro  dose to use after completing 2.5mg  -Using Freestyle Libre 3 for CGM and endorses FBG averaging in 90's with post-prandial readings going as high as the 200's -Does endorse occasional s/sx of hypoglycemia resolved with having snack -A1c 6%, down from 9.4% on 4/8  Hypertension -Current medications:  carvedilol  12.5mg  BID, furosemide  20mg  every MWF, Entresto  49/51mg  BID, spironolactone  25mg  daily -Fill history reflects good adherence, but patient is unable to verify mediations/dose; because sister fills weekly pill box for her, and she takes medications from there -Last office BP elevated at 148/94 -Patient does have automated wrist BP monitor at home but does not check BP regularly  Assessment/Plan   Diabetes Management Plan -Controlled based on recent A1c -Continue Mounjaro  titration as tolerated every 4 weeks to goal dose of 15mg  weekly - future prescriptions on file at [facility], and I am providing my direct number via MyChart message for patient to reach out if any issues obtaining refills arise -Continue use of Libre 3 for CGM -Will likely need to decrease insulin  dosing as we titrate Mounjaro  dose  Hypertension -Uncontrolled -Patient will bring medications in to upcoming in person visit, so I can verify she has all prescribed BP medications -I am checking on insurance coverage of home BP monitor (upper arm for better  accuracy) -Recommend patient monitor home  BP at least 3x.week  Follow-up:  9/15 in person   Bonnie DELENA Mealing, PharmD, DPLA

## 2024-03-21 DIAGNOSIS — H1132 Conjunctival hemorrhage, left eye: Secondary | ICD-10-CM | POA: Diagnosis not present

## 2024-03-26 ENCOUNTER — Telehealth: Payer: Self-pay

## 2024-03-26 MED ORDER — OMRON 3 SERIES BP MONITOR DEVI: Status: DC

## 2024-03-26 NOTE — Progress Notes (Signed)
   03/26/2024  Patient ID: Bonnie Ochoa, female   DOB: [birth year], 58 y.o.   MRN: [id number]  Order for Omron Series 3 automated, upper arm BP monitor sent to DME supplier via [employer] under [facility] standing order to see if Medicaid will approve and supplier ship to patient.    Bonnie DELENA Mealing, PharmD, DPLA

## 2024-04-01 ENCOUNTER — Telehealth: Payer: Self-pay

## 2024-04-01 NOTE — Progress Notes (Signed)
   04/01/2024  Patient ID: Bonnie Ochoa, female   DOB: 09/10/1965, 58 y.o.   MRN: [id number]  DME supplier through [employer] not able to supply and deliver home BP monitor, because neither [facility] Medicare D nor Medicaid will cover.  Contacted [employer] to see if patient's plan offers OTC benefit that can be used to cover home BP monitor.  Patient's plan does provide a monthly or quarterly OTC allowance benefit that can be used to purchase a home blood pressure monitor.  [employer] states there is no available balance on her spending account currently, but $225 ill be loaded again on 9/1.  Contacting patient to make her aware this can be used at [facility], [facility], or [facility] to purchase a home BP monitor.   Bonnie Ochoa, PharmD, DPLA

## 2024-04-24 ENCOUNTER — Telehealth: Payer: Self-pay

## 2024-04-24 NOTE — Progress Notes (Signed)
   04/24/2024  Patient ID: Bonnie Ochoa, female   DOB: 12/31/1965, 58 y.o.   MRN: [id number]  Patient outreach to remind Bonnie Ochoa that she will be seeing me Monday after her 11am visit with Dr. Vicci.  I was not able to reach her, but I did leave a voicemail reminder of the visit and requesting that she bring in all home medications for us  to review.  I am also sending a MyChart message as a reminder.  Bonnie Ochoa, PharmD, DPLA

## 2024-04-27 ENCOUNTER — Ambulatory Visit: Payer: Self-pay

## 2024-04-27 ENCOUNTER — Ambulatory Visit: Admitting: Family Medicine

## 2024-04-27 DIAGNOSIS — R509 Fever, unspecified: Secondary | ICD-10-CM | POA: Diagnosis not present

## 2024-04-27 DIAGNOSIS — R079 Chest pain, unspecified: Secondary | ICD-10-CM | POA: Diagnosis not present

## 2024-04-30 ENCOUNTER — Telehealth: Payer: Self-pay | Admitting: Family Medicine

## 2024-04-30 NOTE — Telephone Encounter (Signed)
 Please find out time of death

## 2024-04-30 NOTE — Telephone Encounter (Signed)
 Copied from CRM [id number]. Topic: General - Deceased Patient >> 05/29/24  9:55 AM Antwanette L wrote: Call dropped. April from [facility] called regarding the patient's death certificate. She stated that Dr. Vicci has not yet signed the certificate.

## 2024-05-01 NOTE — Telephone Encounter (Signed)
Death certificate filled out on [place] ?

## 2024-05-01 NOTE — Telephone Encounter (Signed)
 Spoke with [facility]. Waiting for call back from 12/09/2023 for Time of death for patient

## 2024-05-13 DEATH — deceased

## 2024-12-01 ENCOUNTER — Ambulatory Visit
